# Patient Record
Sex: Male | Born: 1960
Health system: Southern US, Community
[De-identification: ages and names within clinical notes are randomized; demographics above are authoritative.]

## PROBLEM LIST (undated history)

## (undated) DIAGNOSIS — Z8739 Personal history of other diseases of the musculoskeletal system and connective tissue: Secondary | ICD-10-CM

## (undated) DIAGNOSIS — I10 Essential (primary) hypertension: Secondary | ICD-10-CM

## (undated) DIAGNOSIS — Z9289 Personal history of other medical treatment: Secondary | ICD-10-CM

## (undated) DIAGNOSIS — I2510 Atherosclerotic heart disease of native coronary artery without angina pectoris: Secondary | ICD-10-CM

## (undated) DIAGNOSIS — I739 Peripheral vascular disease, unspecified: Secondary | ICD-10-CM

## (undated) DIAGNOSIS — K219 Gastro-esophageal reflux disease without esophagitis: Secondary | ICD-10-CM

## (undated) DIAGNOSIS — C189 Malignant neoplasm of colon, unspecified: Secondary | ICD-10-CM

## (undated) DIAGNOSIS — C20 Malignant neoplasm of rectum: Secondary | ICD-10-CM

## (undated) DIAGNOSIS — M545 Low back pain, unspecified: Secondary | ICD-10-CM

## (undated) DIAGNOSIS — I82409 Acute embolism and thrombosis of unspecified deep veins of unspecified lower extremity: Secondary | ICD-10-CM

## (undated) DIAGNOSIS — Z720 Tobacco use: Secondary | ICD-10-CM

## (undated) DIAGNOSIS — I519 Heart disease, unspecified: Secondary | ICD-10-CM

## (undated) DIAGNOSIS — G8929 Other chronic pain: Secondary | ICD-10-CM

## (undated) DIAGNOSIS — R918 Other nonspecific abnormal finding of lung field: Secondary | ICD-10-CM

## (undated) DIAGNOSIS — I499 Cardiac arrhythmia, unspecified: Secondary | ICD-10-CM

## (undated) HISTORY — PX: ABDOMINAL SURGERY: SHX537

## (undated) HISTORY — PX: INGUINAL HERNIA REPAIR: SUR1180

## (undated) HISTORY — DX: Other nonspecific abnormal finding of lung field: R91.8

## (undated) HISTORY — DX: Malignant neoplasm of colon, unspecified: C18.9

---

## 2002-03-31 ENCOUNTER — Emergency Department (HOSPITAL_COMMUNITY): Admission: EM | Admit: 2002-03-31 | Discharge: 2002-03-31 | Payer: Self-pay | Admitting: Internal Medicine

## 2010-09-28 HISTORY — PX: COLECTOMY: SHX59

## 2010-12-02 DIAGNOSIS — C7800 Secondary malignant neoplasm of unspecified lung: Secondary | ICD-10-CM | POA: Insufficient documentation

## 2010-12-02 DIAGNOSIS — C20 Malignant neoplasm of rectum: Secondary | ICD-10-CM | POA: Insufficient documentation

## 2011-06-05 DIAGNOSIS — K5900 Constipation, unspecified: Secondary | ICD-10-CM | POA: Insufficient documentation

## 2011-06-05 DIAGNOSIS — I10 Essential (primary) hypertension: Secondary | ICD-10-CM | POA: Insufficient documentation

## 2011-06-05 DIAGNOSIS — K6289 Other specified diseases of anus and rectum: Secondary | ICD-10-CM | POA: Insufficient documentation

## 2011-06-25 DIAGNOSIS — IMO0002 Reserved for concepts with insufficient information to code with codable children: Secondary | ICD-10-CM | POA: Insufficient documentation

## 2011-08-31 DIAGNOSIS — R300 Dysuria: Secondary | ICD-10-CM | POA: Insufficient documentation

## 2011-08-31 DIAGNOSIS — N529 Male erectile dysfunction, unspecified: Secondary | ICD-10-CM | POA: Insufficient documentation

## 2011-08-31 DIAGNOSIS — R350 Frequency of micturition: Secondary | ICD-10-CM | POA: Insufficient documentation

## 2011-09-24 DIAGNOSIS — F329 Major depressive disorder, single episode, unspecified: Secondary | ICD-10-CM | POA: Insufficient documentation

## 2011-09-29 HISTORY — PX: COLONOSCOPY: SHX174

## 2011-09-29 HISTORY — PX: COLOSTOMY TAKEDOWN: SHX5783

## 2011-11-19 DIAGNOSIS — Z8719 Personal history of other diseases of the digestive system: Secondary | ICD-10-CM | POA: Insufficient documentation

## 2011-12-03 DIAGNOSIS — R12 Heartburn: Secondary | ICD-10-CM | POA: Insufficient documentation

## 2011-12-03 DIAGNOSIS — R112 Nausea with vomiting, unspecified: Secondary | ICD-10-CM | POA: Insufficient documentation

## 2011-12-04 DIAGNOSIS — E871 Hypo-osmolality and hyponatremia: Secondary | ICD-10-CM | POA: Insufficient documentation

## 2011-12-04 DIAGNOSIS — I871 Compression of vein: Secondary | ICD-10-CM | POA: Insufficient documentation

## 2011-12-04 DIAGNOSIS — I739 Peripheral vascular disease, unspecified: Secondary | ICD-10-CM | POA: Insufficient documentation

## 2011-12-04 DIAGNOSIS — D62 Acute posthemorrhagic anemia: Secondary | ICD-10-CM | POA: Insufficient documentation

## 2012-02-26 ENCOUNTER — Emergency Department (HOSPITAL_COMMUNITY)
Admission: EM | Admit: 2012-02-26 | Discharge: 2012-02-26 | Disposition: A | Discharge status: Home / Self Care | Payer: Medicaid Other | Attending: Emergency Medicine | Admitting: Emergency Medicine

## 2012-02-26 ENCOUNTER — Encounter (HOSPITAL_COMMUNITY): Payer: Self-pay

## 2012-02-26 DIAGNOSIS — K644 Residual hemorrhoidal skin tags: Secondary | ICD-10-CM | POA: Insufficient documentation

## 2012-02-26 DIAGNOSIS — K6289 Other specified diseases of anus and rectum: Secondary | ICD-10-CM | POA: Insufficient documentation

## 2012-02-26 DIAGNOSIS — F17200 Nicotine dependence, unspecified, uncomplicated: Secondary | ICD-10-CM | POA: Insufficient documentation

## 2012-02-26 DIAGNOSIS — I10 Essential (primary) hypertension: Secondary | ICD-10-CM | POA: Insufficient documentation

## 2012-02-26 HISTORY — DX: Essential (primary) hypertension: I10

## 2012-02-26 MED ORDER — OXYCODONE-ACETAMINOPHEN 5-325 MG PO TABS: 1.0000 | ORAL_TABLET | Freq: Four times a day (QID) | ORAL | Status: AC | PRN

## 2012-02-26 MED ORDER — HYDROCORTISONE 2.5 % RE CREA: TOPICAL_CREAM | RECTAL | Status: AC

## 2012-02-26 MED ORDER — OXYCODONE-ACETAMINOPHEN 5-325 MG PO TABS
2.0000 | ORAL_TABLET | Freq: Once | ORAL | Status: AC
  Administered 2012-02-26: 2 via ORAL
  Filled 2012-02-26: qty 2

## 2012-02-26 NOTE — Discharge Instructions (Signed)
Proctalgia Fugax Proctalgia fugax is a very short episode of intense rectal pain. It can last from seconds to minutes. It often occurs in the night, and awakens the person from sleep. It is not a sign of cancer.  CAUSES  The cause of this often intense rectal pain is not known. One possible cause may be spasm of the pelvic muscles or of the lowest part of the large intestine.  SYMPTOMS  The pain of proctalgia fugax:  Is intensely severe.   Lasts from only a few seconds to thirty minutes.   Usually awakens the person from sleep.  DIAGNOSIS  In order to make sure that there are no other problems, diagnostic tests may be done such as:   Anoscopy. This is a lighted scope that is put into the rectum to look for abnormalities.   Barium enema. X-rays are taken after administering a radio-sensitive material.  TREATMENT  A number of things have been used to try to treat this condition, including:  Medications.   Warm baths.   Relaxation techniques.   Gentle massage of the painful area.  HOME CARE INSTRUCTIONS   Take all medications exactly as directed.   Follow any prescribed diet.   Follow instructions regarding both rest and physical activity.   Learn progressive relaxation techniques.  SEEK IMMEDIATE MEDICAL CARE IF:   Your pain does not get better in the usual amount of time.   You develop any new symptoms.  Document Released: 06/09/2001 Document Revised: 09/03/2011 Document Reviewed: 11/15/2008 [facility] Patient Information 2012 [place].

## 2012-02-26 NOTE — ED Notes (Signed)
Pt c/o rectal pain for 2 months after colostomy reversal. Some bleeding noted when wiping

## 2012-02-26 NOTE — ED Provider Notes (Signed)
History   This chart was scribed for Juan Mangle, MD by Juan Wheeler. The patient was seen in room APA05/APA05. Patient's care was started at 0612.    CSN: [id number]  Arrival date & time 02/26/12  0612   First MD Initiated Contact with Patient 02/26/12 0701      Chief Complaint  Patient presents with  . Rectal Pain     (Consider location/radiation/quality/duration/timing/severity/associated sxs/prior treatment) HPI Juan Wheeler is a 51 y.o. male who presents to the Emergency Department complaining of waxing and waning moderate rectal pain onset several weeks ago and persistent since with associated intermittent episodes of red blood with wiping after BM. Patient reports he had ileostomy removed several months ago and has experienced a burning pain which is aggravated with flatulence since removal. Patient states he was prescribed pain medications by his surgeon following removal of ileostomy but recently ran out and reports he has been unable to follow up with surgeon since running out. Denies hematochezia, dizziness, weakness, syncope, chest pain, SOB, constipation.  Patient with h/o HTN and is a current smoker.  Past Medical History  Diagnosis Date  . Hypertension     Past Surgical History  Procedure Date  . Colostomy     History reviewed. No pertinent family history.  History  Substance Use Topics  . Smoking status: Current Everyday Smoker -- 1.0 packs/day  . Smokeless tobacco: Not on file  . Alcohol Use: Yes      Review of Systems  Constitutional: Negative for fever, activity change, appetite change and fatigue.  HENT: Negative for congestion, sore throat, rhinorrhea, neck pain and neck stiffness.   Respiratory: Negative for cough and shortness of breath.   Cardiovascular: Negative for chest pain and palpitations.  Gastrointestinal: Positive for blood in stool. Negative for nausea, vomiting and abdominal pain.  Genitourinary: Negative for dysuria, urgency,  frequency and flank pain.  Musculoskeletal: Negative for myalgias, back pain and arthralgias.  All other systems reviewed and are negative.   A complete 10 system review of systems was obtained and all systems are negative except as noted in the HPI and PMH.   Allergies  Darvocet  Home Medications   Current Outpatient Rx  Name Route Sig Dispense Refill  . HYDROCORTISONE 2.5 % RE CREA  Apply rectally 2 times daily 30 g 0  . OXYCODONE-ACETAMINOPHEN 5-325 MG PO TABS Oral Take 1-2 tablets by mouth every 6 (six) hours as needed for pain. 20 tablet 0    BP 160/104  Pulse 102  Temp(Src) 97.6 F (36.4 C) (Oral)  Resp 16  Ht 5\' 9"  (1.753 m)  Wt 174 lb (78.926 kg)  BMI 25.70 kg/m2  SpO2 96%  Physical Exam  Nursing note and vitals reviewed. Constitutional: He is oriented to person, place, and time. He appears well-developed and well-nourished. No distress.  HENT:  Head: Normocephalic and atraumatic.  Eyes: EOM are normal. Pupils are equal, round, and reactive to light.  Neck: Normal range of motion. Neck supple. No tracheal deviation present.  Cardiovascular: Normal rate, regular rhythm, normal heart sounds and intact distal pulses.   No murmur heard. Pulmonary/Chest: Effort normal and breath sounds normal. No respiratory distress. He exhibits no tenderness.  Abdominal: Soft. He exhibits no distension. There is no tenderness. There is no rebound and no guarding.  Genitourinary: Guaiac negative stool.       Chaperone present for rectal exam. Pain with rectal examination. External irritation noted. External hemorrhoids noted.  No mass or thrombosis noted. No  abscess or fissure.  Musculoskeletal: Normal range of motion. He exhibits no edema.  Neurological: He is alert and oriented to person, place, and time. No sensory deficit.  Skin: Skin is warm and dry.  Psychiatric: He has a normal mood and affect. His behavior is normal.    ED Course  Procedures (including critical care  time)  DIAGNOSTIC STUDIES: Oxygen Saturation is 96% on room air, adequate by my interpretation.    COORDINATION OF CARE: 7:07AM- Rectal exam performed by Juan Mangle, MD. Patient advised of need to follow up with surgeon.    Labs Reviewed - No data to display No results found.   1. Rectal pain       MDM  Proctalgia. There is no evidence of abscess or fissure. There is no fistula. Rectal exam is normal and without blood. He'll be discharged home with pain medication Anusol. Instructed to followup with his primary care physician and general surgeon      I personally performed the services described in this documentation, which was scribed in my presence. The recorded information has been reviewed and considered.    Juan Mangle, MD 02/26/12 [phone]

## 2012-03-24 DIAGNOSIS — M549 Dorsalgia, unspecified: Secondary | ICD-10-CM | POA: Insufficient documentation

## 2012-04-08 ENCOUNTER — Emergency Department (HOSPITAL_COMMUNITY)
Admission: EM | Admit: 2012-04-08 | Discharge: 2012-04-08 | Disposition: A | Discharge status: Home / Self Care | Payer: Medicaid Other | Attending: Emergency Medicine | Admitting: Emergency Medicine

## 2012-04-08 ENCOUNTER — Encounter (HOSPITAL_COMMUNITY): Payer: Self-pay

## 2012-04-08 DIAGNOSIS — H6120 Impacted cerumen, unspecified ear: Secondary | ICD-10-CM | POA: Insufficient documentation

## 2012-04-08 DIAGNOSIS — I10 Essential (primary) hypertension: Secondary | ICD-10-CM | POA: Insufficient documentation

## 2012-04-08 DIAGNOSIS — Z85048 Personal history of other malignant neoplasm of rectum, rectosigmoid junction, and anus: Secondary | ICD-10-CM | POA: Insufficient documentation

## 2012-04-08 DIAGNOSIS — F17200 Nicotine dependence, unspecified, uncomplicated: Secondary | ICD-10-CM | POA: Insufficient documentation

## 2012-04-08 MED ORDER — DOCUSATE SODIUM 50 MG/5ML PO LIQD: 50.0000 mg | Freq: Once | ORAL | Status: DC

## 2012-04-08 MED ORDER — DOCUSATE SODIUM 50 MG/5ML PO LIQD
50.0000 mg | Freq: Once | ORAL | Status: AC
  Administered 2012-04-08: 50 mg via ORAL
  Filled 2012-04-08: qty 10

## 2012-04-08 MED ORDER — CARBAMIDE PEROXIDE 6.5 % OT SOLN
10.0000 [drp] | Freq: Once | OTIC | Status: AC
  Administered 2012-04-08: 10 [drp] via OTIC
  Filled 2012-04-08: qty 15

## 2012-04-08 NOTE — ED Notes (Signed)
Flushed rt ear with peroxide and water. Not able to get anything broken up from within ear.

## 2012-04-08 NOTE — ED Notes (Signed)
Pt reports ears have been stopped up for the past month.  C/O r ear pain.

## 2012-04-08 NOTE — ED Notes (Signed)
Flushed bilateral ears with no success.

## 2012-04-11 NOTE — ED Provider Notes (Signed)
History     CSN: [id number]  Arrival date & time 04/08/12  0932   First MD Initiated Contact with Patient 04/08/12 [phone]      Chief Complaint  Patient presents with  . Otalgia    (Consider location/radiation/quality/duration/timing/severity/associated sxs/prior treatment) Patient is a 51 y.o. male presenting with ear pain. The history is provided by the patient.  Otalgia This is a chronic problem. The current episode started more than 1 week ago. There is pain in both ears. The problem occurs constantly. The problem has not changed since onset.There has been no fever. The pain is mild. Associated symptoms include hearing loss. Pertinent negatives include no ear discharge, no headaches, no rhinorrhea, no sore throat, no vomiting, no neck pain, no cough and no rash.    Past Medical History  Diagnosis Date  . Hypertension   . Cancer     rectal    Past Surgical History  Procedure Date  . Colostomy   . Abdominal surgery   . Hernia repair     No family history on file.  History  Substance Use Topics  . Smoking status: Current Everyday Smoker -- 1.0 packs/day  . Smokeless tobacco: Not on file  . Alcohol Use: Yes     occ      Review of Systems  Constitutional: Negative for fever, chills, activity change and appetite change.  HENT: Positive for hearing loss and ear pain. Negative for congestion, sore throat, facial swelling, rhinorrhea, trouble swallowing, neck pain, neck stiffness and ear discharge.   Eyes: Negative for visual disturbance.  Respiratory: Negative for cough and shortness of breath.   Gastrointestinal: Negative for nausea and vomiting.  Skin: Negative.  Negative for rash.  Neurological: Negative for dizziness, facial asymmetry, weakness, numbness and headaches.  Hematological: Negative for adenopathy.  Psychiatric/Behavioral: Negative for confusion.  All other systems reviewed and are negative.    Allergies  Darvocet  Home Medications   Current  Outpatient Rx  Name Route Sig Dispense Refill  . AMLODIPINE BESYLATE 5 MG PO TABS Oral Take 5 mg by mouth daily.    . ASPIRIN EC 81 MG PO TBEC Oral Take 81 mg by mouth daily.    Marland Kitchen GABAPENTIN 300 MG PO CAPS Oral Take 300 mg by mouth 3 (three) times daily.    Marland Kitchen LISINOPRIL 40 MG PO TABS Oral Take 40 mg by mouth daily.    Marland Kitchen NAPROXEN SODIUM 220 MG PO TABS Oral Take 220 mg by mouth 2 (two) times daily with a meal.      BP 149/76  Pulse 60  Temp 97.6 F (36.4 C) (Oral)  Resp 18  Ht 5\' 8"  (1.727 m)  Wt 175 lb (79.379 kg)  BMI 26.61 kg/m2  SpO2 97%  Physical Exam  Nursing note and vitals reviewed. Constitutional: He is oriented to person, place, and time. He appears well-developed and well-nourished. No distress.  HENT:  Head: Normocephalic and atraumatic. No trismus in the jaw.  Right Ear: No drainage or swelling. No mastoid tenderness. No hemotympanum. Decreased hearing is noted.  Left Ear: No drainage or swelling. No mastoid tenderness. No hemotympanum. Decreased hearing is noted.  Nose: Mucosal edema and rhinorrhea present.  Mouth/Throat: Uvula is midline and mucous membranes are normal. No uvula swelling. Posterior oropharyngeal erythema present. No oropharyngeal exudate, posterior oropharyngeal edema or tonsillar abscesses.       Large amt of dry, packed cerumen present to the bilateral ear canals.  TM's not visualized due to the cerumen.  No edema or drainage.  Neck: Normal range of motion and phonation normal. Neck supple. No Brudzinski's sign and no Kernig's sign noted.  Cardiovascular: Normal rate, regular rhythm, normal heart sounds and intact distal pulses.   No murmur heard. Pulmonary/Chest: Effort normal and breath sounds normal. He has no wheezes. He has no rales.  Abdominal: Soft. Bowel sounds are normal.  Musculoskeletal: He exhibits no edema.  Lymphadenopathy:    He has no cervical adenopathy.  Neurological: He is alert and oriented to person, place, and time. He  exhibits normal muscle tone. Coordination normal.  Skin: Skin is warm and dry.    ED Course  Procedures (including critical care time)  Labs Reviewed - No data to display   1. Cerumen impaction       MDM    Several attempts  By the nursing staff to remove the cerumen from the bilateral ear canals, using H2O2, debrox, and liquid colace..  Only  Small amt was removed.  Patient reports feeling better.  Denies pain, ataxia,  or dizziness at present.  I have advised him that he will need to follow-up with ENT for further management.  Pt appears stable for discharge.  He verbalized understanding and agreed to care plan.  Remaining bottle of debrox was dispensed to pt for home use with instructions for usage.     The patient appears reasonably screened and/or stabilized for discharge and I doubt any other medical condition or other [facility] requiring further screening, evaluation, or treatment in the ED at this time prior to discharge.      Viren Lebeau L. [place], [place] 04/11/12 1524

## 2012-04-12 NOTE — ED Provider Notes (Signed)
Medical screening examination/treatment/procedure(s) were performed by non-physician practitioner and as supervising physician I was immediately available for consultation/collaboration.   Mylinda Latina III, MD 04/12/12 1321

## 2012-08-19 DIAGNOSIS — I70219 Atherosclerosis of native arteries of extremities with intermittent claudication, unspecified extremity: Secondary | ICD-10-CM | POA: Insufficient documentation

## 2012-09-26 ENCOUNTER — Emergency Department (HOSPITAL_COMMUNITY)
Admission: EM | Admit: 2012-09-26 | Discharge: 2012-09-26 | Disposition: A | Discharge status: Home / Self Care | Payer: Medicaid Other | Attending: Emergency Medicine | Admitting: Emergency Medicine

## 2012-09-26 ENCOUNTER — Encounter (HOSPITAL_COMMUNITY): Payer: Self-pay | Admitting: Emergency Medicine

## 2012-09-26 DIAGNOSIS — Z7982 Long term (current) use of aspirin: Secondary | ICD-10-CM | POA: Insufficient documentation

## 2012-09-26 DIAGNOSIS — F1210 Cannabis abuse, uncomplicated: Secondary | ICD-10-CM | POA: Insufficient documentation

## 2012-09-26 DIAGNOSIS — Z85048 Personal history of other malignant neoplasm of rectum, rectosigmoid junction, and anus: Secondary | ICD-10-CM | POA: Insufficient documentation

## 2012-09-26 DIAGNOSIS — F17200 Nicotine dependence, unspecified, uncomplicated: Secondary | ICD-10-CM | POA: Insufficient documentation

## 2012-09-26 DIAGNOSIS — Z79899 Other long term (current) drug therapy: Secondary | ICD-10-CM | POA: Insufficient documentation

## 2012-09-26 DIAGNOSIS — Y929 Unspecified place or not applicable: Secondary | ICD-10-CM | POA: Insufficient documentation

## 2012-09-26 DIAGNOSIS — Z86718 Personal history of other venous thrombosis and embolism: Secondary | ICD-10-CM | POA: Insufficient documentation

## 2012-09-26 DIAGNOSIS — I10 Essential (primary) hypertension: Secondary | ICD-10-CM | POA: Insufficient documentation

## 2012-09-26 DIAGNOSIS — M549 Dorsalgia, unspecified: Secondary | ICD-10-CM

## 2012-09-26 DIAGNOSIS — IMO0002 Reserved for concepts with insufficient information to code with codable children: Secondary | ICD-10-CM | POA: Insufficient documentation

## 2012-09-26 DIAGNOSIS — Y939 Activity, unspecified: Secondary | ICD-10-CM | POA: Insufficient documentation

## 2012-09-26 DIAGNOSIS — W19XXXA Unspecified fall, initial encounter: Secondary | ICD-10-CM | POA: Insufficient documentation

## 2012-09-26 HISTORY — DX: Acute embolism and thrombosis of unspecified deep veins of unspecified lower extremity: I82.409

## 2012-09-26 MED ORDER — PREDNISONE 10 MG PO TABS: ORAL_TABLET | ORAL | Status: DC

## 2012-09-26 MED ORDER — OXYCODONE-ACETAMINOPHEN 5-325 MG PO TABS
2.0000 | ORAL_TABLET | Freq: Once | ORAL | Status: AC
  Administered 2012-09-26: 2 via ORAL
  Filled 2012-09-26: qty 2

## 2012-09-26 MED ORDER — PREDNISONE 50 MG PO TABS
60.0000 mg | ORAL_TABLET | Freq: Once | ORAL | Status: AC
  Administered 2012-09-26: 60 mg via ORAL
  Filled 2012-09-26: qty 1

## 2012-09-26 MED ORDER — OXYCODONE-ACETAMINOPHEN 5-325 MG PO TABS: 2.0000 | ORAL_TABLET | Freq: Once | ORAL | Status: DC

## 2012-09-26 NOTE — ED Provider Notes (Signed)
History   This chart was scribed for Richarda Blade, MD by Marin Comment, ED Scribe. The patient was seen in room APA15/APA15. Patient's care was started at 0949.   CSN: [id number]  Arrival date & time 09/26/12  0909   First MD Initiated Contact with Patient 09/26/12 [phone]      Chief Complaint  Patient presents with  . Back Pain   The history is provided by the patient. No language interpreter was used.  Juan Wheeler is a 51 y.o. male who presents to the Emergency Department complaining of constant, severe lower back pain for the past 3 days. He states that he fell 3 days ago and has had pain since. He states the shooting pain goes into his hips and legs but this has been present for the past few months. He states that he hasn't tried anything for his pain at home. He states that he is to have a procedure for blood clots in his legs in January. He states his back pain is worse with movements. He states that he had an x-ray at [facility] pain clinic. He takes Gabapentin regularly, but reports no relief with his back pain.   Past Medical History  Diagnosis Date  . Hypertension   . Cancer     rectal  . DVT (deep venous thrombosis)     Past Surgical History  Procedure Date  . Colostomy   . Abdominal surgery   . Hernia repair     History reviewed. No pertinent family history.  History  Substance Use Topics  . Smoking status: Current Every Day Smoker -- 1.0 packs/day    Types: Cigarettes  . Smokeless tobacco: Not on file  . Alcohol Use: Yes     Comment: occ      Review of Systems  Musculoskeletal: Positive for back pain and arthralgias.  All other systems reviewed and are negative.    Allergies  Darvocet  Home Medications   Current Outpatient Rx  Name  Route  Sig  Dispense  Refill  . ASPIRIN EC 81 MG PO TBEC   Oral   Take 81 mg by mouth daily.         Marland Kitchen GABAPENTIN 300 MG PO CAPS   Oral   Take 300 mg by mouth 3 (three) times daily. Takes 600 mg in  the morning, 300 in the evening, and 600 in the afternoon.         Marland Kitchen LISINOPRIL 40 MG PO TABS   Oral   Take 40 mg by mouth daily.         . OXYCODONE-ACETAMINOPHEN 5-325 MG PO TABS   Oral   Take 2 tablets by mouth once.   30 tablet   0   . PREDNISONE 10 MG PO TABS      Take q day 6,5,4,3,2,1   21 tablet   0     BP 160/104  Pulse 68  Temp 97.4 F (36.3 C) (Oral)  Resp 18  Ht 5\' 9"  (1.753 m)  Wt 193 lb (87.544 kg)  BMI 28.50 kg/m2  SpO2 94%  Physical Exam  Nursing note and vitals reviewed. Constitutional: He is oriented to person, place, and time. He appears well-developed and well-nourished. No distress.  HENT:  Head: Normocephalic and atraumatic.  Eyes: Conjunctivae normal and EOM are normal.  Neck: Neck supple. No tracheal deviation present.  Cardiovascular: Normal rate, regular rhythm and normal heart sounds.   No murmur heard. Pulmonary/Chest: Effort normal. No respiratory  distress. He has no wheezes. He has no rhonchi. He has no rales.  Abdominal: He exhibits no distension.  Musculoskeletal: Normal range of motion. He exhibits tenderness.       Left lumbar tenderness, lumbar spinal tenderness. Left and right buttocks tenderness.   Neurological: He is alert and oriented to person, place, and time. No sensory deficit.  Skin: Skin is warm and dry.  Psychiatric: He has a normal mood and affect. His behavior is normal.    ED Course  Procedures (including critical care time)  DIAGNOSTIC STUDIES: Oxygen Saturation is 99% on room air, normal by my interpretation.    COORDINATION OF CARE:  10:03-Discussed planned course of treatment with the patient including Prednisone and Percocet, who is agreeable at this time.   10:15-Medication Orders: Prednisone (Deltasone) tablet 60-mg-once; Oxycodone-acetaminophen (Percocet/Roxicet) 5-325 mg per tablet 2 tablet-once.   11:37-Recheck: X-rays were unable to be obtained from pain clinic. Pt reports some relief with ED  medications. Pt appears more comfortable and better able to move his legs. Will refer back to pain clinic and d/c home with pain medication.   1. Back pain       MDM  Nonspecific back and leg pain, most consistent with lumbar radiculopathy, likely from degenerative joint disease. Doubt cauda equina, discitis, sciatica, or fracture. Doubt metabolic instability, serious bacterial infection or impending vascular collapse; the patient is stable for discharge.    I personally performed the services described in this documentation, which was scForribed in my presence. The recorded information has been reviewed and is accurate.     Plan: Home Medications- Percocet, Prednisoneest, heat;Recommended follow up-  PCP, prn      Richarda Blade, MD 09/27/12 [phone]

## 2012-09-26 NOTE — ED Notes (Signed)
Lifting heavy loads of wood Friday and now complaining of severe back pain with shooting pain in hips and legs

## 2012-10-12 DIAGNOSIS — M792 Neuralgia and neuritis, unspecified: Secondary | ICD-10-CM | POA: Insufficient documentation

## 2012-11-09 DIAGNOSIS — G894 Chronic pain syndrome: Secondary | ICD-10-CM | POA: Insufficient documentation

## 2012-11-09 DIAGNOSIS — M25559 Pain in unspecified hip: Secondary | ICD-10-CM | POA: Insufficient documentation

## 2013-03-28 ENCOUNTER — Emergency Department (HOSPITAL_COMMUNITY): Payer: Medicaid Other

## 2013-03-28 ENCOUNTER — Encounter (HOSPITAL_COMMUNITY): Payer: Self-pay | Admitting: *Deleted

## 2013-03-28 ENCOUNTER — Observation Stay (HOSPITAL_COMMUNITY)
Admission: EM | Admit: 2013-03-28 | Discharge: 2013-03-30 | Disposition: A | Discharge status: Home / Self Care | Payer: Medicaid Other | Attending: Internal Medicine | Admitting: Internal Medicine

## 2013-03-28 DIAGNOSIS — R079 Chest pain, unspecified: Secondary | ICD-10-CM | POA: Diagnosis not present

## 2013-03-28 DIAGNOSIS — F17200 Nicotine dependence, unspecified, uncomplicated: Secondary | ICD-10-CM | POA: Insufficient documentation

## 2013-03-28 DIAGNOSIS — E663 Overweight: Secondary | ICD-10-CM | POA: Insufficient documentation

## 2013-03-28 DIAGNOSIS — R0602 Shortness of breath: Secondary | ICD-10-CM | POA: Insufficient documentation

## 2013-03-28 DIAGNOSIS — R42 Dizziness and giddiness: Secondary | ICD-10-CM | POA: Diagnosis not present

## 2013-03-28 DIAGNOSIS — Z85048 Personal history of other malignant neoplasm of rectum, rectosigmoid junction, and anus: Secondary | ICD-10-CM

## 2013-03-28 DIAGNOSIS — Z720 Tobacco use: Secondary | ICD-10-CM | POA: Diagnosis present

## 2013-03-28 DIAGNOSIS — I739 Peripheral vascular disease, unspecified: Secondary | ICD-10-CM | POA: Insufficient documentation

## 2013-03-28 DIAGNOSIS — I10 Essential (primary) hypertension: Secondary | ICD-10-CM

## 2013-03-28 DIAGNOSIS — R61 Generalized hyperhidrosis: Secondary | ICD-10-CM | POA: Diagnosis not present

## 2013-03-28 LAB — CBC WITH DIFFERENTIAL/PLATELET
Basophils Absolute: 0 10*3/uL (ref 0.0–0.1)
Basophils Relative: 0 % (ref 0–1)
Eosinophils Absolute: 0.2 10*3/uL (ref 0.0–0.7)
Eosinophils Relative: 2 % (ref 0–5)
HCT: 47.5 % (ref 39.0–52.0)
Hemoglobin: 16.5 g/dL (ref 13.0–17.0)
Lymphocytes Relative: 21 % (ref 12–46)
Lymphs Abs: 1.8 10*3/uL (ref 0.7–4.0)
MCH: 33.3 pg (ref 26.0–34.0)
MCHC: 34.7 g/dL (ref 30.0–36.0)
MCV: 95.8 fL (ref 78.0–100.0)
Monocytes Absolute: 0.7 10*3/uL (ref 0.1–1.0)
Monocytes Relative: 8 % (ref 3–12)
Neutro Abs: 5.9 10*3/uL (ref 1.7–7.7)
Neutrophils Relative %: 68 % (ref 43–77)
Platelets: 172 10*3/uL (ref 150–400)
RBC: 4.96 MIL/uL (ref 4.22–5.81)
RDW: 15.2 % (ref 11.5–15.5)
WBC: 8.6 10*3/uL (ref 4.0–10.5)

## 2013-03-28 LAB — BASIC METABOLIC PANEL
BUN: 12 mg/dL (ref 6–23)
CO2: 26 mEq/L (ref 19–32)
Calcium: 9.2 mg/dL (ref 8.4–10.5)
Chloride: 102 mEq/L (ref 96–112)
Creatinine, Ser: 1.2 mg/dL (ref 0.50–1.35)
GFR calc Af Amer: 78 mL/min — ABNORMAL LOW (ref >90)
GFR calc non Af Amer: 67 mL/min — ABNORMAL LOW (ref >90)
Glucose, Bld: 108 mg/dL — ABNORMAL HIGH (ref 70–99)
Potassium: 4.1 mEq/L (ref 3.5–5.1)
Sodium: 138 mEq/L (ref 135–145)

## 2013-03-28 LAB — TROPONIN I: Troponin I: <0.3 ng/mL (ref <0.30)

## 2013-03-28 MED ORDER — ASPIRIN 325 MG PO TABS
325.0000 mg | ORAL_TABLET | Freq: Once | ORAL | Status: AC
  Administered 2013-03-28: 325 mg via ORAL
  Filled 2013-03-28: qty 1

## 2013-03-28 MED ORDER — NITROGLYCERIN 0.4 MG SL SUBL: 0.4000 mg | SUBLINGUAL_TABLET | SUBLINGUAL | Status: DC | PRN

## 2013-03-28 NOTE — ED Notes (Addendum)
Pt reporting intermittent chest pain for about 5 days.  Repots pain worse when laying down.  Denies nausea or vomiting.  Occasional SOB with pain. Reports pain has subsided at present. Reports he has not been taking his medications for several days, because he thought that was causing pain.

## 2013-03-28 NOTE — ED Provider Notes (Signed)
History     This chart was scribed for Juan Cable, MD, MD by Rhae Lerner, ED Scribe. The patient was seen in room APA08/APA08 and the patient's care was started at 11:10PM.  CSN: [id number] Arrival date & time 03/28/13  2034    Chief Complaint  Patient presents with  . Chest Pain    Patient is a 52 y.o. male presenting with chest pain. The history is provided by the patient and medical records. No language interpreter was used.  Chest Pain Pain location:  L chest Pain quality: sharp   Pain radiates to:  Does not radiate Pain radiates to the back: no   Pain severity:  Moderate Onset quality:  Sudden Duration:  5 days Timing:  Intermittent Progression:  Unchanged Chronicity:  New Relieved by:  Nothing Worsened by:  Nothing tried Associated symptoms: diaphoresis   Associated symptoms: no fever, no nausea, no shortness of breath, not vomiting and no weakness    HPI Comments: Giovonni Zaugg is a 52 y.o. male with hx of rectal CA, DVT (discovered by [place] 5 months ago) and HTN who presents to the Emergency Department complaining of intermittent, sharp, left chest pain onset 5 days ago. Pt reports that the episodes of chest pain last for 10 minutes or more. He mentions having intermittent numbness in left arm. He states that during the episodes of pain he has SOB and dizziness. He denies hx of similar symptoms. He denies hx of MI or cardiac problems. He denies any aggravating factors. Pt denies radiation of pain, fever, chills, nausea, vomiting, diarrhea, new weakness, cough, diaphoresis and any other pain.  Pt denies taking medication for pain PTA.   Past Medical History  Diagnosis Date  . Hypertension   . Cancer     rectal  . DVT (deep venous thrombosis)    Past Surgical History  Procedure Laterality Date  . Colostomy    . Abdominal surgery    . Hernia repair     History reviewed. No pertinent family history. History  Substance Use Topics  . Smoking status: Current Every  Day Smoker -- 1.00 packs/day    Types: Cigarettes  . Smokeless tobacco: Not on file  . Alcohol Use: Yes     Comment: occ    Review of Systems  Constitutional: Positive for diaphoresis. Negative for fever and chills.  Respiratory: Negative for shortness of breath.   Cardiovascular: Positive for chest pain.  Gastrointestinal: Negative for nausea and vomiting.  Neurological: Negative for weakness.  All other systems reviewed and are negative.    Allergies  Darvocet  Home Medications   Current Outpatient Rx  Name  Route  Sig  Dispense  Refill  . aspirin EC 81 MG tablet   Oral   Take 81 mg by mouth daily.         . cilostazol (PLETAL) 100 MG tablet   Oral   Take 50-100 mg by mouth 2 (two) times daily. Take 0.5-1 by mouth 2 times daily. Takes only one-half tablet per dose if headache occurs         . gabapentin (NEURONTIN) 300 MG capsule   Oral   Take 900-1,200 mg by mouth 3 (three) times daily. Takes 900 mg in the morning, 1200 in the afternoon, and 1200 in the evening         . lisinopril (PRINIVIL,ZESTRIL) 40 MG tablet   Oral   Take 40 mg by mouth daily.         Marland Kitchen  omeprazole (PRILOSEC) 40 MG capsule   Oral   Take 40 mg by mouth daily. TAKE 1 CAPSULE EVERY DAY          BP 185/107  Pulse 82  Temp(Src) 97.9 F (36.6 C) (Oral)  Resp 18  Ht 5\' 9"  (1.753 m)  Wt 190 lb (86.183 kg)  BMI 28.05 kg/m2  SpO2 98%  Physical Exam  Nursing note and vitals reviewed. CONSTITUTIONAL: Well developed/well nourished HEAD: Normocephalic/atraumatic EYES: EOMI/PERRL ENMT: Mucous membranes moist NECK: supple no meningeal signs SPINE:entire spine nontender CV: S1/S2 noted, no murmurs/rubs/gallops noted LUNGS: Lungs are clear to auscultation bilaterally, no apparent distress ABDOMEN: soft, nontender, no rebound or guarding.  Well healed scars noted.  No colostomy noted GU:no cva tenderness NEURO: Pt is awake/alert, moves all extremitiesx4, no focal motor  weakness EXTREMITIES: pulses normal, full ROM SKIN: warm, color normal PSYCH: no abnormalities of mood noted   ED Course  Procedures  DIAGNOSTIC STUDIES: Oxygen Saturation is 98% on room air, normal by my interpretation.    COORDINATION OF CARE: 11:15 PM Discussed ED treatment with pt and pt agrees.   1:42 AM Pt now CP free PE ruled out by d-dimer (low suspicion but h/o DVT per patient) I doubt aortic dissection given exam/history Will admit for possible ACS D/w dr Megan Salon, to admit  MDM  Nursing notes including past medical history and social history reviewed and considered in documentation xrays reviewed and considered Labs/vital reviewed and considered Previous records reviewed and considered - records at [facility] reviewed      Date: 03/29/2013  Rate: 68  Rhythm: normal sinus rhythm  QRS Axis: normal  Intervals: normal  ST/T Wave abnormalities: nonspecific ST changes  Conduction Disutrbances:none  Narrative Interpretation:   Old EKG Reviewed: no change from EKG from 2013 (found in [place] records)    I personally performed the services described in this documentation, which was scribed in my presence. The recorded information has been reviewed and is accurate.        Juan Cable, MD 03/29/13 [phone]

## 2013-03-28 NOTE — ED Notes (Signed)
Pt denies any chest pain at present time.  

## 2013-03-29 ENCOUNTER — Encounter (HOSPITAL_COMMUNITY): Payer: Self-pay | Admitting: *Deleted

## 2013-03-29 DIAGNOSIS — I10 Essential (primary) hypertension: Secondary | ICD-10-CM | POA: Diagnosis not present

## 2013-03-29 DIAGNOSIS — R079 Chest pain, unspecified: Secondary | ICD-10-CM

## 2013-03-29 DIAGNOSIS — F17200 Nicotine dependence, unspecified, uncomplicated: Secondary | ICD-10-CM | POA: Diagnosis not present

## 2013-03-29 DIAGNOSIS — Z720 Tobacco use: Secondary | ICD-10-CM | POA: Diagnosis present

## 2013-03-29 DIAGNOSIS — I739 Peripheral vascular disease, unspecified: Secondary | ICD-10-CM

## 2013-03-29 DIAGNOSIS — Z85048 Personal history of other malignant neoplasm of rectum, rectosigmoid junction, and anus: Secondary | ICD-10-CM

## 2013-03-29 DIAGNOSIS — E663 Overweight: Secondary | ICD-10-CM

## 2013-03-29 LAB — COMPREHENSIVE METABOLIC PANEL
ALT: 7 U/L (ref 0–53)
AST: 14 U/L (ref 0–37)
Albumin: 3.1 g/dL — ABNORMAL LOW (ref 3.5–5.2)
Alkaline Phosphatase: 72 U/L (ref 39–117)
BUN: 11 mg/dL (ref 6–23)
CO2: 25 mEq/L (ref 19–32)
Calcium: 8.8 mg/dL (ref 8.4–10.5)
Chloride: 105 mEq/L (ref 96–112)
Creatinine, Ser: 1.07 mg/dL (ref 0.50–1.35)
GFR calc Af Amer: 90 mL/min — ABNORMAL LOW (ref >90)
GFR calc non Af Amer: 77 mL/min — ABNORMAL LOW (ref >90)
Glucose, Bld: 108 mg/dL — ABNORMAL HIGH (ref 70–99)
Potassium: 4 mEq/L (ref 3.5–5.1)
Sodium: 139 mEq/L (ref 135–145)
Total Bilirubin: 0.5 mg/dL (ref 0.3–1.2)
Total Protein: 7 g/dL (ref 6.0–8.3)

## 2013-03-29 LAB — CBC
HCT: 46.7 % (ref 39.0–52.0)
Hemoglobin: 16.2 g/dL (ref 13.0–17.0)
MCH: 33.3 pg (ref 26.0–34.0)
MCHC: 34.7 g/dL (ref 30.0–36.0)
MCV: 95.9 fL (ref 78.0–100.0)
Platelets: 174 10*3/uL (ref 150–400)
RBC: 4.87 MIL/uL (ref 4.22–5.81)
RDW: 15.1 % (ref 11.5–15.5)
WBC: 6.6 10*3/uL (ref 4.0–10.5)

## 2013-03-29 LAB — URINALYSIS, ROUTINE W REFLEX MICROSCOPIC
Bilirubin Urine: NEGATIVE
Glucose, UA: NEGATIVE mg/dL
Hgb urine dipstick: NEGATIVE
Ketones, ur: NEGATIVE mg/dL
Leukocytes, UA: NEGATIVE
Nitrite: NEGATIVE
Protein, ur: NEGATIVE mg/dL
Specific Gravity, Urine: 1.015 (ref 1.005–1.030)
Urobilinogen, UA: 1 mg/dL (ref 0.0–1.0)
pH: 6 (ref 5.0–8.0)

## 2013-03-29 LAB — LIPID PANEL
Cholesterol: 161 mg/dL (ref 0–200)
HDL: 45 mg/dL (ref >39)
LDL Cholesterol: 96 mg/dL (ref 0–99)
Total CHOL/HDL Ratio: 3.6 RATIO
Triglycerides: 100 mg/dL (ref <150)
VLDL: 20 mg/dL (ref 0–40)

## 2013-03-29 LAB — D-DIMER, QUANTITATIVE: D-Dimer, Quant: <0.27 ug/mL-FEU (ref 0.00–0.48)

## 2013-03-29 LAB — TROPONIN I
Troponin I: <0.3 ng/mL (ref <0.30)
Troponin I: <0.3 ng/mL (ref <0.30)
Troponin I: <0.3 ng/mL (ref <0.30)

## 2013-03-29 LAB — MAGNESIUM: Magnesium: 2 mg/dL (ref 1.5–2.5)

## 2013-03-29 LAB — TSH: TSH: 1.083 u[IU]/mL (ref 0.350–4.500)

## 2013-03-29 LAB — HEMOGLOBIN A1C
Hgb A1c MFr Bld: 5.8 % — ABNORMAL HIGH (ref <5.7)
Mean Plasma Glucose: 120 mg/dL — ABNORMAL HIGH (ref <117)

## 2013-03-29 MED ORDER — ASPIRIN EC 81 MG PO TBEC
81.0000 mg | DELAYED_RELEASE_TABLET | Freq: Every day | ORAL | Status: DC
  Administered 2013-03-29 – 2013-03-30 (×2): 81 mg via ORAL
  Filled 2013-03-29 (×2): qty 1

## 2013-03-29 MED ORDER — ACETAMINOPHEN 325 MG PO TABS: 650.0000 mg | ORAL_TABLET | ORAL | Status: DC | PRN

## 2013-03-29 MED ORDER — ONDANSETRON HCL 4 MG/2ML IJ SOLN: 4.0000 mg | INTRAMUSCULAR | Status: DC | PRN

## 2013-03-29 MED ORDER — NICOTINE 21 MG/24HR TD PT24: 21.0000 mg | MEDICATED_PATCH | Freq: Every day | TRANSDERMAL | Status: DC | PRN

## 2013-03-29 MED ORDER — GABAPENTIN 300 MG PO CAPS
900.0000 mg | ORAL_CAPSULE | Freq: Every day | ORAL | Status: DC
  Administered 2013-03-29 – 2013-03-30 (×2): 900 mg via ORAL
  Filled 2013-03-29 (×2): qty 3

## 2013-03-29 MED ORDER — SODIUM CHLORIDE 0.9 % IJ SOLN
3.0000 mL | Freq: Two times a day (BID) | INTRAMUSCULAR | Status: DC
  Administered 2013-03-29 – 2013-03-30 (×3): 3 mL via INTRAVENOUS

## 2013-03-29 MED ORDER — GABAPENTIN 400 MG PO CAPS
1200.0000 mg | ORAL_CAPSULE | ORAL | Status: DC
  Administered 2013-03-29 (×2): 1200 mg via ORAL
  Filled 2013-03-29 (×2): qty 3

## 2013-03-29 MED ORDER — ENOXAPARIN SODIUM 40 MG/0.4ML ~~LOC~~ SOLN
40.0000 mg | SUBCUTANEOUS | Status: DC
  Administered 2013-03-29 – 2013-03-30 (×2): 40 mg via SUBCUTANEOUS
  Filled 2013-03-29 (×2): qty 0.4

## 2013-03-29 MED ORDER — FLEET ENEMA 7-19 GM/118ML RE ENEM: 1.0000 | ENEMA | Freq: Once | RECTAL | Status: AC | PRN

## 2013-03-29 MED ORDER — CILOSTAZOL 50 MG PO TABS
50.0000 mg | ORAL_TABLET | Freq: Two times a day (BID) | ORAL | Status: DC
  Filled 2013-03-29: qty 2

## 2013-03-29 MED ORDER — TRAZODONE HCL 50 MG PO TABS
50.0000 mg | ORAL_TABLET | Freq: Every evening | ORAL | Status: DC | PRN
  Administered 2013-03-29: 50 mg via ORAL
  Filled 2013-03-29: qty 1

## 2013-03-29 MED ORDER — CILOSTAZOL 100 MG PO TABS
100.0000 mg | ORAL_TABLET | Freq: Two times a day (BID) | ORAL | Status: DC
  Administered 2013-03-29 – 2013-03-30 (×3): 100 mg via ORAL
  Filled 2013-03-29 (×5): qty 1

## 2013-03-29 MED ORDER — PANTOPRAZOLE SODIUM 40 MG PO TBEC
40.0000 mg | DELAYED_RELEASE_TABLET | Freq: Two times a day (BID) | ORAL | Status: DC
  Administered 2013-03-29 – 2013-03-30 (×3): 40 mg via ORAL
  Filled 2013-03-29 (×3): qty 1

## 2013-03-29 MED ORDER — SORBITOL 70 % SOLN: 30.0000 mL | Freq: Every day | Status: DC | PRN

## 2013-03-29 MED ORDER — POLYETHYLENE GLYCOL 3350 17 G PO PACK: 17.0000 g | PACK | Freq: Every day | ORAL | Status: DC | PRN

## 2013-03-29 MED ORDER — LISINOPRIL 10 MG PO TABS
40.0000 mg | ORAL_TABLET | Freq: Every day | ORAL | Status: DC
  Administered 2013-03-29 – 2013-03-30 (×2): 40 mg via ORAL
  Filled 2013-03-29 (×2): qty 4

## 2013-03-29 NOTE — H&P (Signed)
[facility] History and Physical  Juan Wheeler  [id number]  DOB: [birth year]   DOA: 03/29/2013   PCP:   No primary provider on file.   Chief Complaint:  Episodic chest pain for 4 days  HPI: Juan Wheeler is a 52 y.o. male.   African American gentleman who denies prior cardiac, as when having episodic central chest pain radiating up into his neck and into his left arm for the past 4 days. Describes the pain as sharp and it is worse 10 out of 10 typically  last about 10-15 minutes, typically no relieving factors although he does say sometimes drinking water helps.  Pain is associated with dizziness, heat, shortness of breath, and nausea; no diaphoresis He has no family history of cardiac disease; he does smoke one pack of cigarettes a day for at least 15 years; He does smoke marijuana occasionally.  He is status post partial colectomy and chemotherapy for rectal cancer  Rewiew of Systems:   All systems negative except as marked bold or noted in the HPI;  Constitutional:    malaise, fever and chills. ;  Eyes:   eye pain, redness and discharge. ;  ENMT:   ear pain, hoarseness, nasal congestion, sinus pressure and sore throat. ;  Cardiovascular:    chest pain, palpitations, diaphoresis, dyspnea and peripheral edema.  Respiratory:   cough, hemoptysis, wheezing and stridor. ;  Gastrointestinal:  nausea, vomiting, diarrhea, constipation, abdominal pain, melena, blood in stool, hematemesis, jaundice and rectal bleeding. unusual weight loss..   Genitourinary:    frequency, dysuria, incontinence,flank pain and hematuria; Musculoskeletal:   back pain and neck pain.  swelling and trauma.;  Skin: .  pruritus, rash, abrasions, bruising and skin lesion.; ulcerations Neuro:    headache, lightheadedness and neck stiffness.  weakness, altered level of consciousness, altered mental status, extremity weakness, burning feet, involuntary movement, seizure and syncope.  Psych:    anxiety, depression,  insomnia, tearfulness, panic attacks, hallucinations, paranoia, suicidal or homicidal ideation    Past Medical History  Diagnosis Date  . Hypertension   . Cancer     rectal  . DVT (deep venous thrombosis)     Past Surgical History  Procedure Laterality Date  . Colostomy    . Abdominal surgery    . Hernia repair      Medications:  HOME MEDS: Prior to Admission medications   Medication Sig Start Date End Date Taking? Authorizing Provider  aspirin EC 81 MG tablet Take 81 mg by mouth daily.   Yes Historical Provider, MD  cilostazol (PLETAL) 100 MG tablet Take 50-100 mg by mouth 2 (two) times daily. Take 0.5-1 by mouth 2 times daily. Takes only one-half tablet per dose if headache occurs 07/01/12 07/01/13 Yes Historical Provider, MD  gabapentin (NEURONTIN) 300 MG capsule Take 900-1,200 mg by mouth 3 (three) times daily. Takes 900 mg in the morning, 1200 in the afternoon, and 1200 in the evening   Yes Historical Provider, MD  lisinopril (PRINIVIL,ZESTRIL) 40 MG tablet Take 40 mg by mouth daily.   Yes Historical Provider, MD  omeprazole (PRILOSEC) 40 MG capsule Take 40 mg by mouth daily. TAKE 1 CAPSULE EVERY DAY 01/15/13  Yes Historical Provider, MD     Allergies:  Allergies  Allergen Reactions  . Darvocet (Propoxyphene-Acetaminophen) Palpitations    Social History:   reports that he has been smoking Cigarettes.  He has been smoking about 1.00 pack per day. He does not have any smokeless tobacco history on file. He reports  that  drinks alcohol. He reports that he uses illicit drugs (Marijuana).  Family History: History reviewed. No pertinent family history. No family history of heart disease  Physical Exam: Filed Vitals:   03/28/13 2045 03/28/13 2047 03/28/13 2341  BP:  185/107 166/96  Pulse: 82  64  Temp: 97.9 F (36.6 C)    TempSrc: Oral    Resp: 18  20  Height: 5\' 9"  (1.753 m)    Weight: 86.183 kg (190 lb)    SpO2: 98%  94%   Blood pressure 166/96, pulse 64,  temperature 97.9 F (36.6 C), temperature source Oral, resp. rate 20, height 5\' 9"  (1.753 m), weight 86.183 kg (190 lb), SpO2 94.00%. Body mass index is 28.05 kg/(m^2).   GEN:  Pleasant *African American gentleman lying bed in no acute distress; cooperative with exam PSYCH:  alert and oriented x4;  anxious nor depressed; affect is appropriate. HEENT: Mucous membranes pink and anicteric; PERRLA; EOM intact; no cervical lymphadenopathy nor thyromegaly or carotid bruit; no JVD; Breasts:: Not examined CHEST WALL: No tenderness CHEST: Normal respiration, clear to auscultation bilaterally HEART: Regular rate and rhythm; no murmurs rubs or gallops BACK: No kyphosis no scoliosis; no CVA tenderness ABDOMEN: Obese, soft non-tender; no masses, no organomegaly, normal abdominal bowel sounds; no pannus; no intertriginous candida. Rectal Exam: Not done EXTREMITIES:  age-appropriate arthropathy of the hands and knees; no edema; no ulcerations. Genitalia: not examined PULSES: 2+ and symmetric SKIN: Normal hydration no rash or ulceration CNS: Cranial nerves 2-12 grossly intact no focal lateralizing neurologic deficit   Labs on Admission:  Basic Metabolic Panel:  Recent Labs Lab 03/28/13 2324  NA 138  K 4.1  CL 102  CO2 26  GLUCOSE 108*  BUN 12  CREATININE 1.20  CALCIUM 9.2   Liver Function Tests: No results found for this basename: AST, ALT, ALKPHOS, BILITOT, PROT, ALBUMIN,  in the last 168 hours No results found for this basename: LIPASE, AMYLASE,  in the last 168 hours No results found for this basename: AMMONIA,  in the last 168 hours CBC:  Recent Labs Lab 03/28/13 2324  WBC 8.6  NEUTROABS 5.9  HGB 16.5  HCT 47.5  MCV 95.8  PLT 172   Cardiac Enzymes:  Recent Labs Lab 03/28/13 2324  TROPONINI <0.30   BNP: No components found with this basename: POCBNP,  D-dimer: No components found with this basename: D-DIMER,  CBG: No results found for this basename: GLUCAP,  in the  last 168 hours  Radiological Exams on Admission: Dg Chest Portable 1 View  03/28/2013   *RADIOLOGY REPORT*  Clinical Data: Rectal cancer.  Colostomy.  Chest pain.  PORTABLE CHEST - 1 VIEW  Comparison: None.  Findings: Diffuse nonspecific interstitial prominence is present. Chronicity of unknown.  Exam is under penetrated. Cardiopericardial silhouette within normal limits.  No focal consolidation. Monitoring leads are projected over the chest.  IMPRESSION: No acute cardiopulmonary disease.  Diffuse nonspecific interstitial prominence.   Original Report Authenticated By: Dereck Ligas, M.D.    EKG: Independently reviewed. Sinus rhythm, poor R wave progression in the anteroseptal leads   Assessment/Plan  Active Problems:   Chest pain   Hypertension   Overweight   Tobacco abuse  Motivation and readiness for weight loss intervention Body mass index is 28.05 kg/(m^2). Had not been thinking about changs to diet;  has been thinking about joins [employer] and gym for excersise And discussed the different food groups fruits and vegetables versus protein and carbohydrate.  PLAN:  Bring this gentleman on observation to cycle cardiac enzymes and monitor for further chest pain; Because of his age and the radiation of the pain; he may benefit from a cardiology evaluation as an in patient or outpatient   Other plans as per orders.  Code Status: Full code Family Communication: Plans discuss with patien Disposition Plan: Likely home in a day or    Anupama Piehl Nocturnist [facility] Pager [phone]  03/29/2013, 12:58 AM

## 2013-03-29 NOTE — ED Notes (Signed)
Attempted to call report to floor.  Left message for RN

## 2013-03-29 NOTE — Progress Notes (Signed)
[facility] PROGRESS NOTE  Juan Wheeler [id number] DOB: 1961/07/04 DOA: 03/28/2013 PCP: No primary provider on file.  HPI: Juan Wheeler is a 52 y.o. male. African American gentleman who denies prior cardiac, as when having episodic central chest pain radiating up into his neck and into his left arm for the past 4 days. Describes the pain as sharp and it is worse 10 out of 10 typically last about 10-15 minutes, typically no relieving factors although he does say sometimes drinking water helps. Pain is associated with dizziness, heat, shortness of breath, and nausea; no diaphoresis. He has no family history of cardiac disease; he does smoke one pack of cigarettes a day for at least 15 years; He does smoke marijuana occasionally.  Assessment/Plan:   Chest pain - some atypical features with his pain being described as "sharp". Most features are however typical with left neck/shoulder/arm pain with finger numbness. D dimer negative. Have kindly asked cardiology to evaluate for further workup, stress vs others.  History of rectal cancer - followed at [facility], as far as she knows he is cancer free. Has follow up in few months for scan. PAD - has advanced disease per notes at [facility]. Stable. On home medications.  HTN - on home medications.  Tobacco abuse - counseled today. Willing to quit. DVT Prophylaxis - Lovenox   Code Status: Full Family Communication: none  Disposition Plan: pending cardiology evaluation  Consultants:  Cardiology  Procedures:  none  Antibiotics:  Anti-infectives   None     Antibiotics Given (last 72 hours)   None     HPI/Subjective: - no chest pain; had little this morning but did not tell anyone.   Objective: Filed Vitals:   03/29/13 0132 03/29/13 0314 03/29/13 0330 03/29/13 0650  BP: 163/100 168/99 150/93 149/75  Pulse:  67 65 66  Temp: 97.9 F (36.6 C)  97.6 F (36.4 C) 97.4 F (36.3 C)  TempSrc: Oral  Oral Oral  Resp: 18 20 20 20    Height:      Weight:      SpO2: 97% 98% 97% 97%    Intake/Output Summary (Last 24 hours) at 03/29/13 1027 Last data filed at 03/29/13 0300  Gross per 24 hour  Intake    240 ml  Output      0 ml  Net    240 ml   Filed Weights   03/28/13 2045  Weight: 86.183 kg (190 lb)    Exam:   General:  NAD  Cardiovascular: regular rate and rhythm, without MRG  Respiratory: good air movement, clear to auscultation throughout, no wheezing, ronchi or rales  Abdomen: soft, mildly tender to palpation throughout, positive bowel sounds  MSK: no peripheral edema  Neuro: CN 2-12 grossly intact, MS 5/5 in all 4  Data Reviewed: Basic Metabolic Panel:  Recent Labs Lab 03/28/13 2324 03/29/13 0528  NA 138 139  K 4.1 4.0  CL 102 105  CO2 26 25  GLUCOSE 108* 108*  BUN 12 11  CREATININE 1.20 1.07  CALCIUM 9.2 8.8  MG  --  2.0   Liver Function Tests:  Recent Labs Lab 03/29/13 0528  AST 14  ALT 7  ALKPHOS 72  BILITOT 0.5  PROT 7.0  ALBUMIN 3.1*   CBC:  Recent Labs Lab 03/28/13 2324 03/29/13 0528  WBC 8.6 6.6  NEUTROABS 5.9  --   HGB 16.5 16.2  HCT 47.5 46.7  MCV 95.8 95.9  PLT 172 174   Cardiac Enzymes:  Recent Labs Lab 03/28/13 2324 03/29/13 0516  TROPONINI <0.30 <0.30    Studies: Dg Chest Portable 1 View  03/28/2013   *RADIOLOGY REPORT*  Clinical Data: Rectal cancer.  Colostomy.  Chest pain.  PORTABLE CHEST - 1 VIEW  Comparison: None.  Findings: Diffuse nonspecific interstitial prominence is present. Chronicity of unknown.  Exam is under penetrated. Cardiopericardial silhouette within normal limits.  No focal consolidation. Monitoring leads are projected over the chest.  IMPRESSION: No acute cardiopulmonary disease.  Diffuse nonspecific interstitial prominence.   Original Report Authenticated By: Dereck Ligas, M.D.    Scheduled Meds: . aspirin EC  81 mg Oral Daily  . cilostazol  100 mg Oral BID  . enoxaparin (LOVENOX) injection  40 mg Subcutaneous Q24H   . gabapentin  1,200 mg Oral Custom  . gabapentin  900 mg Oral Daily  . lisinopril  40 mg Oral Daily  . pantoprazole  40 mg Oral BID AC  . sodium chloride  3 mL Intravenous Q12H   Continuous Infusions:   Active Problems:   Chest pain   Hypertension   Overweight   Tobacco abuse   Time spent: [place], MD [facility] Pager [phone]. If 7 PM - 7 AM, please contact night-coverage at [email or web address], password [facility] 03/29/2013, 10:27 AM  LOS: 1 day

## 2013-03-29 NOTE — Progress Notes (Signed)
UR Chart Review Completed  

## 2013-03-30 DIAGNOSIS — I10 Essential (primary) hypertension: Secondary | ICD-10-CM | POA: Diagnosis not present

## 2013-03-30 DIAGNOSIS — I739 Peripheral vascular disease, unspecified: Secondary | ICD-10-CM | POA: Diagnosis not present

## 2013-03-30 DIAGNOSIS — R079 Chest pain, unspecified: Secondary | ICD-10-CM | POA: Diagnosis not present

## 2013-03-30 DIAGNOSIS — E663 Overweight: Secondary | ICD-10-CM | POA: Diagnosis not present

## 2013-03-30 MED ORDER — NICOTINE 21 MG/24HR TD PT24: 1.0000 | MEDICATED_PATCH | TRANSDERMAL | Status: DC

## 2013-03-30 NOTE — Discharge Summary (Signed)
Physician Discharge Summary  Juan Wheeler [id number] DOB: August 22, 1961 DOA: 03/28/2013  PCP: No primary provider on file.  Admit date: 03/28/2013 Discharge date: 03/30/2013  Time spent: 40 minutes  Recommendations for Outpatient Follow-up:  1. Please follow up with cardiology as an outpatient for stress test as instructed below 2. Please follow up with PCP in 1-2 weeks post hospital stay.   Discharge Diagnoses:  Active Problems:   Chest pain   Hypertension   Overweight   Tobacco abuse  Discharge Condition: stable  Diet recommendation: heart healthy  Filed Weights   03/28/13 2045 03/30/13 0539  Weight: 86.183 kg (190 lb) 86.1 kg (189 lb 13.1 oz)    History of present illness:  Juan Wheeler is a 52 y.o. male. African American gentleman who denies prior cardiac, as when having episodic central chest pain radiating up into his neck and into his left arm for the past 4 days. Describes the pain as sharp and it is worse 10 out of 10 typically last about 10-15 minutes, typically no relieving factors although he does say sometimes drinking water helps. Pain is associated with dizziness, heat, shortness of breath, and nausea; no diaphoresis. He has no family history of cardiac disease; he does smoke one pack of cigarettes a day for at least 15 years; He does smoke marijuana occasionally.   Hospital Course:  Chest pain - some atypical features with his pain being described as "sharp". Most features are however typical with left neck/shoulder/arm pain with finger numbness. D dimer negative. Cardiology evaluated patient while hospitalized and recommended outpatient myoview which will be set up soon. Patient ambulated in the hallway with the nursing staff and denied chest pain or dyspnea with ambulation thus will undergo outpatient evaluation. He regularly sees a cardiologist at [facility] however wants to transfer his care here.  History of rectal cancer - followed at [facility], as far as she knows  he is cancer free. Has follow up in few months for scan.  PAD - has advanced disease per notes at [facility]. Stable. On home medications.  HTN - on home medications.  Tobacco abuse - counseled during hospitalization and a prescription for nicotine patches has been given on discharge. Willing to quit.   Procedures:  none   Consultations:  Cardiology  Discharge Exam: Filed Vitals:   03/29/13 1541 03/29/13 2157 03/30/13 0539 03/30/13 1008  BP: 151/95 140/84 128/89 148/94  Pulse: 60 77 80 118  Temp: 97.6 F (36.4 C) 97.7 F (36.5 C) 98 F (36.7 C) 97.4 F (36.3 C)  TempSrc: Oral Oral Oral Oral  Resp: 20 20 20 20   Height:      Weight:   86.1 kg (189 lb 13.1 oz)   SpO2: 99% 92% 90% 98%   General: NAD Cardiovascular: RRR Respiratory: CTA biL  Discharge Instructions      Future Appointments Provider Department Dept Phone   04/07/2013 11:00 AM [facility] [place] [phone]   04/14/2013 3:00 PM Lendon Colonel, NP [facility] [place]       Medication List         aspirin EC 81 MG tablet  Take 81 mg by mouth daily.     gabapentin 300 MG capsule  Commonly known as:  NEURONTIN  Take 900-1,200 mg by mouth 3 (three) times daily. Takes 900 mg in the morning, 1200 in the afternoon, and 1200 in the evening     lisinopril 40 MG tablet  Commonly known as:  PRINIVIL,ZESTRIL  Take 40 mg by mouth daily.     nicotine 21 mg/24hr patch  Commonly known as:  NICODERM CQ - dosed in mg/24 hours  Place 1 patch onto the skin daily.     omeprazole 40 MG capsule  Commonly known as:  PRILOSEC  Take 40 mg by mouth daily. TAKE 1 CAPSULE EVERY DAY     PLETAL 100 MG tablet  Generic drug:  cilostazol  Take 50-100 mg by mouth 2 (two) times daily. Take 0.5-1 by mouth 2 times daily. Takes only one-half tablet per dose if headache occurs       Follow-up Information   Follow up with [place] On 04/07/2013. (8:30 am)     Contact information:   Radiology at [facility] for stress test. Linna Hoff, [place]      Follow up with Jory Sims, NP On 04/14/2013. (3 pm)    Contact information:   [address] [place] [address] [phone]      The results of significant diagnostics from this hospitalization (including imaging, microbiology, ancillary and laboratory) are listed below for reference.    Significant Diagnostic Studies: Dg Chest Portable 1 View  03/28/2013   *RADIOLOGY REPORT*  Clinical Data: Rectal cancer.  Colostomy.  Chest pain.  PORTABLE CHEST - 1 VIEW  Comparison: None.  Findings: Diffuse nonspecific interstitial prominence is present. Chronicity of unknown.  Exam is under penetrated. Cardiopericardial silhouette within normal limits.  No focal consolidation. Monitoring leads are projected over the chest.  IMPRESSION: No acute cardiopulmonary disease.  Diffuse nonspecific interstitial prominence.   Original Report Authenticated By: Dereck Ligas, M.D.   Labs: Basic Metabolic Panel:  Recent Labs Lab 03/28/13 2324 03/29/13 0528  NA 138 139  K 4.1 4.0  CL 102 105  CO2 26 25  GLUCOSE 108* 108*  BUN 12 11  CREATININE 1.20 1.07  CALCIUM 9.2 8.8  MG  --  2.0   Liver Function Tests:  Recent Labs Lab 03/29/13 0528  AST 14  ALT 7  ALKPHOS 72  BILITOT 0.5  PROT 7.0  ALBUMIN 3.1*   CBC:  Recent Labs Lab 03/28/13 2324 03/29/13 0528  WBC 8.6 6.6  NEUTROABS 5.9  --   HGB 16.5 16.2  HCT 47.5 46.7  MCV 95.8 95.9  PLT 172 174   Cardiac Enzymes:  Recent Labs Lab 03/28/13 2324 03/29/13 0516 03/29/13 1014 03/29/13 1546  TROPONINI <0.30 <0.30 <0.30 <0.30    Signed:  Marzetta Board  [facility] 03/30/2013, 11:08 AM

## 2013-03-30 NOTE — Consult Note (Signed)
CARDIOLOGY CONSULT NOTE  Patient ID: Park Sudhoff MRN: [id number] DOB/AGE: 11-13-1960 52 y.o.  Admit date: 03/28/2013 Referring Physician: PTH Primary PhysicianNo primary provider on file. Primary Cardiologist: [facility] Dr. Genia Del) Reason for Consultation: Chest Pain Active Problems:   Chest pain   Hypertension   Overweight   Tobacco abuse  HPI: [name] is a 52 year old male patient admitted with recurrent chest pain located on the left side of his chest occurring at rest and with exertion described as sharp with some radiation to his shoulder. The patient states that the sensation sometimes wakes him up at night with associated shortness of breath, and most recently while walking to the store approximately 3 minutes from his home he began to have some pain in his upper chest with associated shortness of breath requiring him to stop. The pain lasts approximately 5 to 6 minutes and goes away on its own with rest.    He was seen in ER and ruled out for PE. Cardiac enzymes were cycled and found to be negative x3. EKG had some nonspecific T wave changes with evidence of a septal infarct. No change from prior EKG found in [facility] records. The patient has been pain-free since admission. Is anxious to return home.     He is normally followed by Dr. Genia Del at [facility], cardiologist, with history of hypertension, peripheral arterial disease with occlusion of the right internal iliac artery, with significant atherosclerosis in the left internal iliac which was not amendable to reconstruction per notes from [facility]. With ongoing intermittent claudication symptoms,. He was last seen by Dr. Gillian Scarce in February of 2014. The patient was continued on Pletal, ACE inhibitor, and aspirin. The patient states he has had a stress test in the past and was told that it was negative, however on review of [facility] notes I do not have documentation to  confirm.   The patient fortunately continues to smoke, drink alcohol. He is chronic neurologic pain, and is followed in pain management clinic at [facility]. He was last seen there in May of 2014. Other history includes colorectal cancer stage II, depression, peptic ulcer disease.   Review of systems complete and found to be negative unless listed above   Past Medical History  Diagnosis Date  . Hypertension   . Cancer     rectal  . DVT (deep venous thrombosis)     History reviewed. No pertinent family history.  History   Social History  . Marital Status: Single    Spouse Name: N/A    Number of Children: N/A  . Years of Education: N/A   Occupational History  . Not on file.   Social History Main Topics  . Smoking status: Current Every Day Smoker -- 1.00 packs/day for 15 years    Types: Cigarettes  . Smokeless tobacco: Current User  . Alcohol Use: Yes     Comment: occ  . Drug Use: Yes    Special: Marijuana  . Sexually Active: Yes    Birth Control/ Protection: None   Other Topics Concern  . Not on file   Social History Narrative  . No narrative on file    Past Surgical History  Procedure Laterality Date  . Colostomy    . Abdominal surgery    . Hernia repair       Prescriptions prior to admission  Medication Sig Dispense Refill  . aspirin EC 81 MG tablet Take 81 mg by mouth  daily.      . cilostazol (PLETAL) 100 MG tablet Take 50-100 mg by mouth 2 (two) times daily. Take 0.5-1 by mouth 2 times daily. Takes only one-half tablet per dose if headache occurs      . gabapentin (NEURONTIN) 300 MG capsule Take 900-1,200 mg by mouth 3 (three) times daily. Takes 900 mg in the morning, 1200 in the afternoon, and 1200 in the evening      . lisinopril (PRINIVIL,ZESTRIL) 40 MG tablet Take 40 mg by mouth daily.      Marland Kitchen omeprazole (PRILOSEC) 40 MG capsule Take 40 mg by mouth daily. TAKE 1 CAPSULE EVERY DAY       Echocardiogram 12/11 Normal left ventricular size Regional wall  motion abnormalities as above Mild left ventricular global hypokinesis Abnormal diastolic relaxation Normal right ventricular size Normal right ventricular function No significant valve stenosis No significant valvular regurgitation No pericardial effusion No old study for comparison  I have personally reviewed and interpreted this echocardiogram.  Signed: 09/03/2010 10:21 AM By: Valaria Good, MD   Physical Exam: Blood pressure 128/89, pulse 80, temperature 98 F (36.7 C), temperature source Oral, resp. rate 20, height 5\' 9"  (1.753 m), weight 189 lb 13.1 oz (86.1 kg), SpO2 90.00%.  General: Well developed, well nourished, in no acute distress Head: Eyes PERRLA, No xanthomas.   Normal cephalic and atramatic  Lungs: Clear bilaterally to auscultation, No wheezes or coughing. Heart: HRRR S1 S2, without MRG.  Pulses are 2+ & equal.            No carotid bruit. No JVD.  No abdominal bruits. Positive for soft left femoral bruits. Abdomen: Bowel sounds are positive, abdomen soft and non-tender without masses or                  Hernia's noted. Msk:  Back normal, normal gait. Normal strength and tone for age. Extremities: No clubbing, cyanosis or edema.  DP diminished on the left. Neuro: Alert and oriented X 3. Psych:  Good affect, responds appropriately   Labs:   Lab Results  Component Value Date   WBC 6.6 03/29/2013   HGB 16.2 03/29/2013   HCT 46.7 03/29/2013   MCV 95.9 03/29/2013   PLT 174 03/29/2013    Recent Labs Lab 03/29/13 0528  NA 139  K 4.0  CL 105  CO2 25  BUN 11  CREATININE 1.07  CALCIUM 8.8  PROT 7.0  BILITOT 0.5  ALKPHOS 72  ALT 7  AST 14  GLUCOSE 108*   Lab Results  Component Value Date   TROPONINI <0.30 03/29/2013    Lab Results  Component Value Date   CHOL 161 03/29/2013   Lab Results  Component Value Date   HDL 45 03/29/2013   Lab Results  Component Value Date   LDLCALC 96 03/29/2013   Lab Results  Component Value Date   TRIG 100 03/29/2013   Lab  Results  Component Value Date   CHOLHDL 3.6 03/29/2013   Aortagram with Run-Off 10/06/2012 [facility] The left common and external iliac arteries were widely patent with no dominant stenosis. He did have a severe stenosis of the ostia of the left hypogastric artery. Once the pigtail catheter had been repositioned down into the external iliac artery the left lower extremity runoff demonstrated a patent common femoral artery and profunda femorus branches. There was a small superficial femoral artery with a number of moderate stenoses including at the ostia and then in the mid thigh.  The vessel was patent down into the above knee popliteal segment, which then crossed the knee without any critical stenoses and gave rise to the anterior tibial artery and the tibioperoneal trunk. Both of these were diseased but patent. Then the tibial vessel runoff included dominant anterior tibial artery, which crossed the ankle but had a number of areas of stenosis along its length. The peroneal artery was occluded proximally and then had a small collateral fill and then delayed filling down towards the ankle but was diseased. The posterior tibial artery is patent but disease proximally and made it to the ankle and then across where it filled into the plantar arch, but forefoot was provided primarily by the dorsalis pedis. At this point, we felt there were no lesions that would be likely to improve his situation with an endovascular reconstruction. The small vessel disease was felt to not be amenable to endovascular reconstruction    Radiology: Dg Chest Portable 1 View  03/28/2013   *RADIOLOGY REPORT*  Clinical Data: Rectal cancer.  Colostomy.  Chest pain.  PORTABLE CHEST - 1 VIEW  Comparison: None.  Findings: Diffuse nonspecific interstitial prominence is present. Chronicity of unknown.  Exam is under penetrated. Cardiopericardial silhouette within normal limits.  No focal consolidation. Monitoring leads are projected over  the chest.  IMPRESSION: No acute cardiopulmonary disease.  Diffuse nonspecific interstitial prominence.   Original Report Authenticated By: Dereck Ligas, M.D.   EKG: NSR with rate of 68 bpm. Septal Q-wave.  ASSESSMENT AND PLAN:   1. Chest Pain: Typical and atypical features described as sharp radiating to his left shoulder, associated shortness of breath, with multiple cardiovascular risk factors. Only one episode occurred with activity  All others at rest.  The patient has been pain-free since admission. Cardiac enzymes and EKG rule out ACS. He would benefit from a stress Myoview for ongoing assessment for CAD. He is followed by cardiologist at [facility], review of records, did not demonstrate prior stress test or cardiac catheterization documentation. The patient states that he has had stress test completed in the past and was found to be negative.  He wishes to follow up in our office to avoid having to drive to [facility] for ongoing CV care. We can plan for OP stress test prior to discharge.  2. PAD: He has history of iliac disease not amenable to revascularization or intervention her [facility] hospital vascular surgeons. He continues to have intermittent claudication symptoms, remains on Pletal and aspirin, although admits not taking Pletal as directed as this causes a headache. The patient continues with neurologic pain on gabapentin.  3. Hypertension: Benefit from repeat a cardiogram this was not been completed since 2011 for review of [facility] records, he admits to skipping doses of his antihypertensive on occasion in order to make them last longer when his finances are low. However, he states he has been taken as directed recently. Pressure is well-controlled on ACE inhibitor, lisinopril 40 mg daily. Creatinine 1.07.  4. Ongoing tobacco abuse: He is been counseled on smoking cessation on several occasions for review of records by his PCP and cardiologist via [facility]. His  history and course during my assessment. Early on NicoDerm patch.  5. Chronic Pain Syndrome: He is followed by pain management clinic at [facility]. Their notes state that he has 2 different types of pain one possibly related to rectal carcinoma, and chronic neurologic pain.     Signed: Phill Myron. Purcell Nails NP [facility] 03/30/2013, 8:23 AM  Co-Sign MD  Patient seen and examined  I have amended note above by Jory Sims to reflect my findings.  Plan will be to ambulate If pain free then d/c home with outpatient myoview.

## 2013-03-30 NOTE — Progress Notes (Signed)
Pt ambulated in hallway. Pt had no SOB, chest pain, or difficulties with balance. Will continue to monitor.

## 2013-03-30 NOTE — Progress Notes (Signed)
Pt is to be discharged home today. Pt is in NAD, IV is out, all paperwork has been reviewed/discussed with patient, and there are no questions/concerns at this time. Assessment is unchanged from this morning. Pt is to be accompanied downstairs by staff and family via wheelchair.  

## 2013-03-30 NOTE — Discharge Instructions (Signed)
You were cared for by a hospitalist during your hospital stay. If you have any questions about your discharge medications or the care you received while you were in the hospital after you are discharged, you can call the unit and asked to speak with the hospitalist on call if the hospitalist that took care of you is not available. Once you are discharged, your primary care physician will handle any further medical issues. Please note that NO REFILLS for any discharge medications will be authorized once you are discharged, as it is imperative that you return to your primary care physician (or establish a relationship with a primary care physician if you do not have one) for your aftercare needs so that they can reassess your need for medications and monitor your lab values.     If you do not have a primary care physician, you can call [phone] for a physician referral.    Follow with Primary MD in 7 days   Get CBC, CMP, checked 7 days by Primary MD and again as instructed by your Primary MD. Get a 2 view Chest X ray done next visit if you had Pneumonia of Lung problems at the [facility] reviewed and adjusted.  Please request your Prim.MD to go over all Hospital Tests and Procedure/Radiological results at the follow up, please get all Hospital records sent to your Prim MD by signing hospital release before you go home.  Activity: As tolerated with Full fall precautions use walker/cane & assistance as needed   Diet:  Heart healthy,  Fluid restriction 1.8 lit/day, Aspiration precautions.  For Heart failure patients - Check your Weight same time everyday, if you gain over 2 pounds, or you develop in leg swelling, experience more shortness of breath or chest pain, call your Primary MD immediately. Follow Cardiac Low Salt Diet and 1.8 lit/day fluid restriction.  Disposition Home  If you experience worsening of your admission symptoms, develop shortness of breath, life threatening  emergency, suicidal or homicidal thoughts you must seek medical attention immediately by calling 911 or calling your MD immediately  if symptoms less severe.  You Must read complete instructions/literature along with all the possible adverse reactions/side effects for all the Medicines you take and that have been prescribed to you. Take any new Medicines after you have completely understood and accpet all the possible adverse reactions/side effects.   Do not drive and provide baby sitting services if your were admitted for syncope or siezures until you have seen by Primary MD or a Neurologist and advised to do so again.  Do not drive when taking Pain medications.    Do not take more than prescribed Pain, Sleep and Anxiety Medications  Special Instructions: If you have smoked or chewed Tobacco  in the last 2 yrs please stop smoking, stop any regular Alcohol  and or any Recreational drug use.  Wear Seat belts while driving.

## 2013-04-03 ENCOUNTER — Other Ambulatory Visit: Payer: Self-pay | Admitting: *Deleted

## 2013-04-03 DIAGNOSIS — R079 Chest pain, unspecified: Secondary | ICD-10-CM

## 2013-04-04 ENCOUNTER — Other Ambulatory Visit: Payer: Self-pay | Admitting: *Deleted

## 2013-04-04 DIAGNOSIS — R079 Chest pain, unspecified: Secondary | ICD-10-CM

## 2013-04-05 ENCOUNTER — Encounter (HOSPITAL_COMMUNITY)
Admission: RE | Admit: 2013-04-05 | Discharge: 2013-04-05 | Disposition: A | Discharge status: Home / Self Care | Payer: Medicaid Other | Source: Ambulatory Visit | Attending: Internal Medicine | Admitting: Internal Medicine

## 2013-04-05 ENCOUNTER — Ambulatory Visit (HOSPITAL_COMMUNITY)
Admission: RE | Admit: 2013-04-05 | Discharge: 2013-04-05 | Disposition: A | Discharge status: Home / Self Care | Payer: Medicaid Other | Source: Ambulatory Visit | Attending: Internal Medicine | Admitting: Internal Medicine

## 2013-04-05 ENCOUNTER — Encounter (HOSPITAL_COMMUNITY): Payer: Self-pay

## 2013-04-05 DIAGNOSIS — I10 Essential (primary) hypertension: Secondary | ICD-10-CM | POA: Insufficient documentation

## 2013-04-05 DIAGNOSIS — R079 Chest pain, unspecified: Secondary | ICD-10-CM

## 2013-04-05 DIAGNOSIS — F17200 Nicotine dependence, unspecified, uncomplicated: Secondary | ICD-10-CM | POA: Insufficient documentation

## 2013-04-05 MED ORDER — REGADENOSON 0.4 MG/5ML IV SOLN
INTRAVENOUS | Status: AC
  Administered 2013-04-05: 0.4 mg via INTRAVENOUS
  Filled 2013-04-05: qty 5

## 2013-04-05 MED ORDER — TECHNETIUM TC 99M SESTAMIBI - CARDIOLITE
10.0000 | Freq: Once | INTRAVENOUS | Status: AC | PRN
  Administered 2013-04-05: 10 via INTRAVENOUS

## 2013-04-05 MED ORDER — SODIUM CHLORIDE 0.9 % IJ SOLN
INTRAMUSCULAR | Status: AC
  Administered 2013-04-05: 10 mL via INTRAVENOUS
  Filled 2013-04-05: qty 10

## 2013-04-05 MED ORDER — TECHNETIUM TC 99M SESTAMIBI - CARDIOLITE
30.0000 | Freq: Once | INTRAVENOUS | Status: AC | PRN
  Administered 2013-04-05: 12:00:00 29 via INTRAVENOUS

## 2013-04-05 NOTE — Progress Notes (Signed)
Stress Lab Nurses Notes - Juan Wheeler  Juan Wheeler 04/05/2013 Reason for doing test: Chest Pain Type of test: Test Changed unable to reach THR on TM, Lexiscan given Nurse performing test: Gerrit Halls, RN Nuclear Medicine Tech: Dyanne Carrel Echo Tech: Not Applicable MD performing test: Myles Gip & Jory Sims NP Family MD: NPCP Test explained and consent signed: yes IV started: 22g jelco, Saline lock flushed, No redness or edema and Saline lock started in radiology Symptoms:SOB, hip & leg pain Treatment/Intervention: None Reason test stopped: protocol completed After recovery IV was: Discontinued via X-ray tech and No redness or edema Patient to return to [facility]. Med at : 12:30 Patient discharged: Home Patient's Condition upon discharge was: stable Comments: During test peak BP 178/86 & HR 109.   Recovery BP 137/97 & HR 88.   Symptoms resolved in recovery. Geanie Cooley T

## 2013-04-07 ENCOUNTER — Other Ambulatory Visit (HOSPITAL_COMMUNITY): Payer: Medicaid Other

## 2013-04-07 DIAGNOSIS — I70219 Atherosclerosis of native arteries of extremities with intermittent claudication, unspecified extremity: Secondary | ICD-10-CM | POA: Diagnosis not present

## 2013-04-14 ENCOUNTER — Encounter: Payer: Self-pay | Admitting: *Deleted

## 2013-04-14 ENCOUNTER — Ambulatory Visit (INDEPENDENT_AMBULATORY_CARE_PROVIDER_SITE_OTHER): Payer: Medicare Other | Admitting: Adult Health

## 2013-04-14 ENCOUNTER — Encounter: Payer: Self-pay | Admitting: Adult Health

## 2013-04-14 VITALS — BP 128/68 | HR 95 | Ht 69.0 in | Wt 193.0 lb

## 2013-04-14 DIAGNOSIS — F17200 Nicotine dependence, unspecified, uncomplicated: Secondary | ICD-10-CM | POA: Diagnosis not present

## 2013-04-14 DIAGNOSIS — I70219 Atherosclerosis of native arteries of extremities with intermittent claudication, unspecified extremity: Secondary | ICD-10-CM | POA: Diagnosis not present

## 2013-04-14 DIAGNOSIS — Z720 Tobacco use: Secondary | ICD-10-CM

## 2013-04-14 DIAGNOSIS — I10 Essential (primary) hypertension: Secondary | ICD-10-CM

## 2013-04-14 DIAGNOSIS — R079 Chest pain, unspecified: Secondary | ICD-10-CM | POA: Diagnosis not present

## 2013-04-14 MED ORDER — ISOSORBIDE MONONITRATE 15 MG HALF TABLET: 15.0000 mg | ORAL_TABLET | Freq: Every day | ORAL | Status: DC

## 2013-04-14 NOTE — Progress Notes (Deleted)
Name: Juan Wheeler    DOB: 10/20/1960  Age: 52 y.o.  MR#: [id number]       PCP:  No primary provider on file.      Insurance: Payor: MEDICAID [place] / Plan: MEDICAID [place] / Product Type: *No Product type* /   CC:    Chief Complaint  Patient presents with  . Chest Pain    VS Filed Vitals:   04/14/13 1455  BP: 128/68  Pulse: 95  Height: 5\' 9"  (1.753 m)  Weight: 193 lb (87.544 kg)  SpO2: 94%    Weights Current Weight  04/14/13 193 lb (87.544 kg)  03/30/13 189 lb 13.1 oz (86.1 kg)  09/26/12 193 lb (87.544 kg)    Blood Pressure  BP Readings from Last 3 Encounters:  04/14/13 128/68  03/30/13 148/94  09/26/12 160/104     Admit date:  (Not on file) Last encounter with RMR:  Visit date not found   Allergy Darvocet  Current Outpatient Prescriptions  Medication Sig Dispense Refill  . aspirin EC 81 MG tablet Take 81 mg by mouth daily.      . cilostazol (PLETAL) 100 MG tablet Take 50-100 mg by mouth 2 (two) times daily. Take 0.5-1 by mouth 2 times daily. Takes only one-half tablet per dose if headache occurs      . gabapentin (NEURONTIN) 300 MG capsule Take 900-1,200 mg by mouth 3 (three) times daily. Takes 900 mg in the morning, 1200 in the afternoon, and 1200 in the evening      . lisinopril (PRINIVIL,ZESTRIL) 40 MG tablet Take 40 mg by mouth daily.      Marland Kitchen omeprazole (PRILOSEC) 40 MG capsule Take 40 mg by mouth daily. TAKE 1 CAPSULE EVERY DAY       No current facility-administered medications for this visit.    Discontinued Meds:    Medications Discontinued During This Encounter  Medication Reason  . nicotine (NICODERM CQ - DOSED IN MG/24 HOURS) 21 mg/24hr patch Completed Course    Patient Active Problem List   Diagnosis Date Noted  . Chest pain 03/29/2013  . Overweight 03/29/2013  . Tobacco abuse 03/29/2013  . Hypertension   . Chronic pain syndrome 11/09/2012  . Pain in joint, pelvic region and thigh 11/09/2012  . Neuralgia and neuritis 10/12/2012  . Atherosclerosis  of native arteries of the extremities with intermittent claudication 08/19/2012  . Backache 03/24/2012  . Peripheral vascular disease 12/04/2011  . Hyposmolality and/or hyponatremia 12/04/2011  . Acute posthemorrhagic anemia 12/04/2011  . Compression of vein 12/04/2011  . Heartburn 12/03/2011  . Nausea with vomiting 12/03/2011  . Personal history of digestive disease 11/19/2011  . Chronic pain 11/12/2011  . Depressive disorder 09/24/2011  . Dysuria 08/31/2011  . Impotence of organic origin 08/31/2011  . Urinary frequency 08/31/2011  . Hematoma complicating a procedure 06/25/2011  . Constipation 06/05/2011  . Essential hypertension 06/05/2011  . Anal or rectal pain 06/05/2011  . Malignant neoplasm of rectum 12/02/2010    LABS    Component Value Date/Time   NA 139 03/29/2013 0528   NA 138 03/28/2013 2324   K 4.0 03/29/2013 0528   K 4.1 03/28/2013 2324   CL 105 03/29/2013 0528   CL 102 03/28/2013 2324   CO2 25 03/29/2013 0528   CO2 26 03/28/2013 2324   GLUCOSE 108* 03/29/2013 0528   GLUCOSE 108* 03/28/2013 2324   BUN 11 03/29/2013 0528   BUN 12 03/28/2013 2324   CREATININE 1.07 03/29/2013 0528  CREATININE 1.20 03/28/2013 2324   CALCIUM 8.8 03/29/2013 0528   CALCIUM 9.2 03/28/2013 2324   GFRNONAA 77* 03/29/2013 0528   GFRNONAA 67* 03/28/2013 2324   GFRAA 90* 03/29/2013 0528   GFRAA 78* 03/28/2013 2324   CMP     Component Value Date/Time   NA 139 03/29/2013 0528   K 4.0 03/29/2013 0528   CL 105 03/29/2013 0528   CO2 25 03/29/2013 0528   GLUCOSE 108* 03/29/2013 0528   BUN 11 03/29/2013 0528   CREATININE 1.07 03/29/2013 0528   CALCIUM 8.8 03/29/2013 0528   PROT 7.0 03/29/2013 0528   ALBUMIN 3.1* 03/29/2013 0528   AST 14 03/29/2013 0528   ALT 7 03/29/2013 0528   ALKPHOS 72 03/29/2013 0528   BILITOT 0.5 03/29/2013 0528   GFRNONAA 77* 03/29/2013 0528   GFRAA 90* 03/29/2013 0528       Component Value Date/Time   WBC 6.6 03/29/2013 0528   WBC 8.6 03/28/2013 2324   HGB 16.2 03/29/2013 0528   HGB 16.5 03/28/2013 2324   HCT 46.7 03/29/2013  0528   HCT 47.5 03/28/2013 2324   MCV 95.9 03/29/2013 0528   MCV 95.8 03/28/2013 2324    Lipid Panel     Component Value Date/Time   CHOL 161 03/29/2013 0517   TRIG 100 03/29/2013 0517   HDL 45 03/29/2013 0517   CHOLHDL 3.6 03/29/2013 0517   VLDL 20 03/29/2013 0517   LDLCALC 96 03/29/2013 0517    ABG No results found for this basename: phart, pco2, pco2art, po2, po2art, hco3, tco2, acidbasedef, o2sat     Lab Results  Component Value Date   TSH 1.083 03/29/2013   BNP (last 3 results) No results found for this basename: PROBNP,  in the last 8760 hours Cardiac Panel (last 3 results) No results found for this basename: CKTOTAL, CKMB, TROPONINI, RELINDX,  in the last 72 hours  Iron/TIBC/Ferritin No results found for this basename: iron, tibc, ferritin     EKG Orders placed in visit on 04/14/13  . EKG 12-LEAD     Prior Assessment and Plan Problem List as of 04/14/2013     Cardiovascular and Mediastinum   Hypertension   Atherosclerosis of native arteries of the extremities with intermittent claudication   Peripheral vascular disease   Essential hypertension   Compression of vein     Digestive   Constipation   Personal history of digestive disease   Malignant neoplasm of rectum   Nausea with vomiting     Nervous and Auditory   Neuralgia and neuritis     Genitourinary   Impotence of organic origin     Other   Chest pain   Overweight   Tobacco abuse   Backache   Chronic pain syndrome   Chronic pain   Depressive disorder   Dysuria   Heartburn   Hematoma complicating a procedure   Pain in joint, pelvic region and thigh   Hyposmolality and/or hyponatremia   Acute posthemorrhagic anemia   Anal or rectal pain   Urinary frequency       Imaging: Nm Myocar Single W/spect W/wall Motion And Ef  04/05/2013   Ordering Physician: Dorris Carnes  Reading Physician: Satira Sark  Clinical Data: 52 year old male with history of hypertension, tobacco use, peripheral arterial disease,  and recent chest pain symptoms.  He is referred now for the assessment of ischemia.  NUCLEAR MEDICINE STRESS MYOVIEW STUDY WITH SPECT AND LEFT VENTRICULAR EJECTION FRACTION  Radionuclide Data: One-day rest/stress  protocol performed with 10/30 mCi of Tc-41m Myoview.  Stress Data: The patient was exercised on a Bruce protocol for 1 minute and 16 seconds achieving a maximum workload of 4.6 mets. Heart rate increased from 69 beats per minute up to 117 beats per minute, which was only 70% of the maximum age predicted heart rate response.  Lexiscan was given to insure adequate stress response. Peak blood pressure was 178/86.  No chest pain was reported.  There were no diagnostic ST-segment abnormalities, no arrhythmias.  EKG: Baseline tracing shows sinus rhythm with poor R-wave progression, rule out old anteroseptal infarct pattern.  Scintigraphic Data: Analysis of the raw perfusion data finds adequate radiotracer uptake.  Tomographic views obtained using the short axis, vertical long axis, and horizontal long axis planes.  There is a small, mild intensity anterior defect that is fixed.  In addition to this there is a small, moderate intensity, mid to basal defect that is partially reversible.  Gated imaging reveals an EDV of 130, ESV of 76, T I D ratio 0.91, and LVEF of 42% with global hypokinesis.  IMPRESSION: Abnormal exercise/Lexiscan Myoview.  Low to intermediate risk study overall.  There were no clearly diagnostic ST-segment abnormalities.  Perfusion imaging shows a fixed defect in the anterior wall that is relatively small, cannot rule out scar but no active ischemia in this distribution.  There is a small area of ischemia in the mid to basal lateral wall. LV volumes are normal, LVEF calculated at 42% with global hypokinesis.   Original Report Authenticated By: Rozann Lesches   Dg Chest Portable 1 View  03/28/2013   *RADIOLOGY REPORT*  Clinical Data: Rectal cancer.  Colostomy.  Chest pain.  PORTABLE CHEST - 1  VIEW  Comparison: None.  Findings: Diffuse nonspecific interstitial prominence is present. Chronicity of unknown.  Exam is under penetrated. Cardiopericardial silhouette within normal limits.  No focal consolidation. Monitoring leads are projected over the chest.  IMPRESSION: No acute cardiopulmonary disease.  Diffuse nonspecific interstitial prominence.   Original Report Authenticated By: Dereck Ligas, M.D.

## 2013-04-14 NOTE — Assessment & Plan Note (Signed)
The patient has been counseled on smoking cessation.

## 2013-04-14 NOTE — Patient Instructions (Addendum)
Your physician recommends that you schedule a follow-up appointment in: AFTER YOUR PROCEDURE.    Your physician has recommended you make the following change in your medication: IMDUR 15 MG DAILY  Your physician recommends that you return for lab work today.  Your physician has requested that you have a cardiac catheterization. Cardiac catheterization is used to diagnose and/or treat various heart conditions. Doctors may recommend this procedure for a number of different reasons. The most common reason is to evaluate chest pain. Chest pain can be a symptom of coronary artery disease (CAD), and cardiac catheterization can show whether plaque is narrowing or blocking your heart's arteries. This procedure is also used to evaluate the valves, as well as measure the blood flow and oxygen levels in different parts of your heart. For further information please visit [email or web address]. Please follow instruction sheet, as given.

## 2013-04-14 NOTE — Assessment & Plan Note (Signed)
He continues on Pletal, and has had some improvement in symptoms.

## 2013-04-14 NOTE — Progress Notes (Signed)
   HPI: [name] is a 52 year old patient here to be est. in our office with Dr. Pennelope Bracken, after admission for chest pain at [facility]. The patient chest pain was found to be atypical described as sharp left neck shoulder and arm. The patient was recommended for an outpatient stress Myoview. Stay was described as abnormal but low to intermediate risk study overall. Perfusion imaging showed a fixed defect in the anterior wall that is relatively small, cannot rule out scar but no active ischemia in this distribution. There is a small area of ischemia in the mid to basal and lateral wall LV volumes are normal with an LVEF of 42% with global hypokinesis.   He continues complaints of chest discomfort. He stenosis especially at nighttime. He states it lasts anywhere from a few minutes to an hour with no radiation. He has occasional shortness of breath associated with this.  Allergies  Allergen Reactions  . Darvocet (Propoxyphene-Acetaminophen) Palpitations    Current Outpatient Prescriptions  Medication Sig Dispense Refill  . aspirin EC 81 MG tablet Take 81 mg by mouth daily.      . cilostazol (PLETAL) 100 MG tablet Take 50-100 mg by mouth 2 (two) times daily. Take 0.5-1 by mouth 2 times daily. Takes only one-half tablet per dose if headache occurs      . gabapentin (NEURONTIN) 300 MG capsule Take 900-1,200 mg by mouth 3 (three) times daily. Takes 900 mg in the morning, 1200 in the afternoon, and 1200 in the evening      . lisinopril (PRINIVIL,ZESTRIL) 40 MG tablet Take 40 mg by mouth daily.      Marland Kitchen omeprazole (PRILOSEC) 40 MG capsule Take 40 mg by mouth daily. TAKE 1 CAPSULE EVERY DAY      . isosorbide mononitrate (IMDUR) 15 mg TB24 Take 0.5 tablets (15 mg total) by mouth daily.  30 tablet  6   No current facility-administered medications for this visit.    Past Medical History  Diagnosis Date  . Hypertension   . Cancer     rectal  . DVT (deep venous thrombosis)     Past  Surgical History  Procedure Laterality Date  . Colostomy    . Abdominal surgery    . Hernia repair      [id number] of systems complete and found to be negative unless listed above   PHYSICAL EXAM BP 128/68  Pulse 95  Ht 5\' 9"  (1.753 m)  Wt 193 lb (87.544 kg)  BMI 28.49 kg/m2  SpO2 94%  General: Well developed, well nourished, in no acute distress Head: Eyes PERRLA, No xanthomas.   Normal cephalic and atramatic  Lungs: Clear bilaterally to auscultation and percussion. Heart: HRRR S1 S2, without MRG.  Pulses are 2+ & equal.            No carotid bruit. No JVD.  No abdominal bruits Abdomen: Bowel sounds are positive, abdomen soft and non-tender without masses or                  Hernia's noted. Msk:  Back normal, normal gait. Normal strength and tone for age. Extremities: No clubbing, cyanosis or edema.  DP +1 Neuro: Alert and oriented X 3. Psych:  Good affect, responds appropriately  EKG: Normal sinus rhythm with PR interval of 0.10. T-wave inversion noted in the lateral leads, and in leads II,II AvF. Rate of 99 beats per minute  ASSESSMENT AND PLAN

## 2013-04-14 NOTE — Assessment & Plan Note (Signed)
He currently is well-controlled with a blood pressure 120/68 on this evaluation. He will continue on lisinopril 40 mg daily. I have given him a low dose of isosorbide 15mg  daily for for chest pain.. This certainly can be discontinued if he has no CAD, or has hypotensive response.

## 2013-04-14 NOTE — Assessment & Plan Note (Signed)
The patient had an abnormal stress Myoview demonstrating a fixed defect in anterior wall that is relatively small, cannot rule out scar but no active ischemia in that distribution. However there was a small area of ischemia in the mid to basal lateral wall. LVEF is 42% with global hypokinesis. The patient has ongoing chest discomfort which he noticed mostly at nighttime. With history of peripheral arterial disease, and hypertension. The patient will likely benefit from diagnostic cardiac catheterization for definitive evaluation of CAD. I have discussed this with Dr. Pennelope Bracken on-site, who has reviewed the chart and is in agreement that the patient would benefit from cath. I have discussed risks and benefits to include bleeding IV dye reaction and death. The patient verbalizes understanding and is willing to proceed. He has been provided the literature concerning cardiac catheterization in written form for him to review prior to the catheterization as well. Precath orders are written he is scheduled for 04/17/2013.

## 2013-04-15 LAB — CBC
HCT: 47.2 % (ref 39.0–52.0)
Hemoglobin: 16.9 g/dL (ref 13.0–17.0)
MCH: 33.3 pg (ref 26.0–34.0)
MCHC: 35.8 g/dL (ref 30.0–36.0)
MCV: 93.1 fL (ref 78.0–100.0)
Platelets: 201 10*3/uL (ref 150–400)
RBC: 5.07 MIL/uL (ref 4.22–5.81)
RDW: 15.1 % (ref 11.5–15.5)
WBC: 5.9 10*3/uL (ref 4.0–10.5)

## 2013-04-15 LAB — APTT: aPTT: 34.9 seconds (ref 24–37)

## 2013-04-15 LAB — PROTIME-INR
INR: 0.9 (ref <1.50)
Prothrombin Time: 12 seconds (ref 11.6–15.2)

## 2013-04-15 LAB — BASIC METABOLIC PANEL
BUN: 14 mg/dL (ref 6–23)
CO2: 28 mEq/L (ref 19–32)
Calcium: 9.4 mg/dL (ref 8.4–10.5)
Chloride: 108 mEq/L (ref 96–112)
Creat: 1.1 mg/dL (ref 0.50–1.35)
Glucose, Bld: 104 mg/dL — ABNORMAL HIGH (ref 70–99)
Potassium: 4.3 mEq/L (ref 3.5–5.3)
Sodium: 143 mEq/L (ref 135–145)

## 2013-04-17 ENCOUNTER — Telehealth: Payer: Self-pay | Admitting: *Deleted

## 2013-04-17 ENCOUNTER — Ambulatory Visit (HOSPITAL_COMMUNITY)
Admission: RE | Admit: 2013-04-17 | Discharge: 2013-04-18 | Disposition: A | Discharge status: Home / Self Care | Payer: Medicaid Other | Source: Ambulatory Visit | Attending: Cardiology | Admitting: Cardiology

## 2013-04-17 ENCOUNTER — Encounter (HOSPITAL_COMMUNITY)
Admission: RE | Disposition: A | Discharge status: Home / Self Care | Payer: Self-pay | Source: Ambulatory Visit | Attending: Cardiology

## 2013-04-17 ENCOUNTER — Encounter (HOSPITAL_COMMUNITY): Payer: Self-pay | Admitting: General Practice

## 2013-04-17 DIAGNOSIS — R079 Chest pain, unspecified: Secondary | ICD-10-CM

## 2013-04-17 DIAGNOSIS — F17200 Nicotine dependence, unspecified, uncomplicated: Secondary | ICD-10-CM | POA: Insufficient documentation

## 2013-04-17 DIAGNOSIS — Z7982 Long term (current) use of aspirin: Secondary | ICD-10-CM | POA: Insufficient documentation

## 2013-04-17 DIAGNOSIS — G8929 Other chronic pain: Secondary | ICD-10-CM | POA: Insufficient documentation

## 2013-04-17 DIAGNOSIS — I70209 Unspecified atherosclerosis of native arteries of extremities, unspecified extremity: Secondary | ICD-10-CM | POA: Insufficient documentation

## 2013-04-17 DIAGNOSIS — I10 Essential (primary) hypertension: Secondary | ICD-10-CM | POA: Insufficient documentation

## 2013-04-17 DIAGNOSIS — Z85048 Personal history of other malignant neoplasm of rectum, rectosigmoid junction, and anus: Secondary | ICD-10-CM | POA: Insufficient documentation

## 2013-04-17 DIAGNOSIS — Z79899 Other long term (current) drug therapy: Secondary | ICD-10-CM | POA: Insufficient documentation

## 2013-04-17 DIAGNOSIS — Z955 Presence of coronary angioplasty implant and graft: Secondary | ICD-10-CM

## 2013-04-17 DIAGNOSIS — I70219 Atherosclerosis of native arteries of extremities with intermittent claudication, unspecified extremity: Secondary | ICD-10-CM

## 2013-04-17 DIAGNOSIS — I739 Peripheral vascular disease, unspecified: Secondary | ICD-10-CM | POA: Diagnosis present

## 2013-04-17 DIAGNOSIS — Z86718 Personal history of other venous thrombosis and embolism: Secondary | ICD-10-CM | POA: Insufficient documentation

## 2013-04-17 DIAGNOSIS — I200 Unstable angina: Secondary | ICD-10-CM | POA: Diagnosis present

## 2013-04-17 DIAGNOSIS — I2510 Atherosclerotic heart disease of native coronary artery without angina pectoris: Secondary | ICD-10-CM | POA: Insufficient documentation

## 2013-04-17 DIAGNOSIS — Z720 Tobacco use: Secondary | ICD-10-CM | POA: Diagnosis present

## 2013-04-17 HISTORY — DX: Atherosclerotic heart disease of native coronary artery without angina pectoris: I25.10

## 2013-04-17 HISTORY — DX: Peripheral vascular disease, unspecified: I73.9

## 2013-04-17 HISTORY — DX: Heart disease, unspecified: I51.9

## 2013-04-17 HISTORY — DX: Tobacco use: Z72.0

## 2013-04-17 HISTORY — DX: Other chronic pain: G89.29

## 2013-04-17 HISTORY — DX: Malignant neoplasm of rectum: C20

## 2013-04-17 HISTORY — PX: CORONARY ANGIOPLASTY WITH STENT PLACEMENT: SHX49

## 2013-04-17 HISTORY — DX: Low back pain: M54.5

## 2013-04-17 HISTORY — DX: Personal history of other medical treatment: Z92.89

## 2013-04-17 HISTORY — PX: LEFT HEART CATHETERIZATION WITH CORONARY ANGIOGRAM: SHX5451

## 2013-04-17 HISTORY — DX: Low back pain, unspecified: M54.50

## 2013-04-17 HISTORY — DX: Gastro-esophageal reflux disease without esophagitis: K21.9

## 2013-04-17 HISTORY — PX: PERCUTANEOUS STENT INTERVENTION: SHX5500

## 2013-04-17 LAB — POCT ACTIVATED CLOTTING TIME: Activated Clotting Time: 524 seconds

## 2013-04-17 SURGERY — LEFT HEART CATHETERIZATION WITH CORONARY ANGIOGRAM
Anesthesia: LOCAL

## 2013-04-17 MED ORDER — LIDOCAINE HCL (PF) 1 % IJ SOLN
INTRAMUSCULAR | Status: AC
  Filled 2013-04-17: qty 30

## 2013-04-17 MED ORDER — PRASUGREL HCL 10 MG PO TABS
10.0000 mg | ORAL_TABLET | Freq: Every day | ORAL | Status: DC
  Administered 2013-04-18: 10 mg via ORAL
  Filled 2013-04-17 (×2): qty 1

## 2013-04-17 MED ORDER — SODIUM CHLORIDE 0.9 % IJ SOLN: 3.0000 mL | Freq: Two times a day (BID) | INTRAMUSCULAR | Status: DC

## 2013-04-17 MED ORDER — GABAPENTIN 600 MG PO TABS
1200.0000 mg | ORAL_TABLET | ORAL | Status: DC
  Administered 2013-04-17 (×2): 1200 mg via ORAL
  Filled 2013-04-17 (×4): qty 2

## 2013-04-17 MED ORDER — SODIUM CHLORIDE 0.9 % IV SOLN: 0.2500 mg/kg/h | INTRAVENOUS | Status: AC

## 2013-04-17 MED ORDER — FENTANYL CITRATE 0.05 MG/ML IJ SOLN
INTRAMUSCULAR | Status: AC
  Filled 2013-04-17: qty 2

## 2013-04-17 MED ORDER — BIVALIRUDIN 250 MG IV SOLR
INTRAVENOUS | Status: AC
  Filled 2013-04-17: qty 250

## 2013-04-17 MED ORDER — ISOSORBIDE MONONITRATE 15 MG HALF TABLET
15.0000 mg | ORAL_TABLET | Freq: Every day | ORAL | Status: DC
  Administered 2013-04-17 – 2013-04-18 (×2): 15 mg via ORAL
  Filled 2013-04-17 (×2): qty 1

## 2013-04-17 MED ORDER — ACETAMINOPHEN 325 MG PO TABS: 650.0000 mg | ORAL_TABLET | ORAL | Status: DC | PRN

## 2013-04-17 MED ORDER — PANTOPRAZOLE SODIUM 40 MG PO TBEC
40.0000 mg | DELAYED_RELEASE_TABLET | Freq: Every day | ORAL | Status: DC
  Administered 2013-04-17 – 2013-04-18 (×2): 40 mg via ORAL
  Filled 2013-04-17 (×2): qty 1

## 2013-04-17 MED ORDER — MIDAZOLAM HCL 2 MG/2ML IJ SOLN
INTRAMUSCULAR | Status: AC
  Filled 2013-04-17: qty 2

## 2013-04-17 MED ORDER — SODIUM CHLORIDE 0.9 % IJ SOLN: 3.0000 mL | INTRAMUSCULAR | Status: DC | PRN

## 2013-04-17 MED ORDER — NITROGLYCERIN 0.2 MG/ML ON CALL CATH LAB
INTRAVENOUS | Status: AC
  Filled 2013-04-17: qty 1

## 2013-04-17 MED ORDER — ATORVASTATIN CALCIUM 80 MG PO TABS
80.0000 mg | ORAL_TABLET | Freq: Every day | ORAL | Status: DC
  Administered 2013-04-17: 80 mg via ORAL
  Filled 2013-04-17 (×2): qty 1

## 2013-04-17 MED ORDER — VERAPAMIL HCL 2.5 MG/ML IV SOLN
INTRAVENOUS | Status: AC
  Filled 2013-04-17: qty 2

## 2013-04-17 MED ORDER — PRASUGREL HCL 10 MG PO TABS
ORAL_TABLET | ORAL | Status: AC
  Filled 2013-04-17: qty 6

## 2013-04-17 MED ORDER — SODIUM CHLORIDE 0.9 % IV SOLN: 1.0000 mL/kg/h | INTRAVENOUS | Status: AC

## 2013-04-17 MED ORDER — GABAPENTIN 300 MG PO CAPS: 900.0000 mg | ORAL_CAPSULE | Freq: Three times a day (TID) | ORAL | Status: DC

## 2013-04-17 MED ORDER — HEPARIN (PORCINE) IN NACL 2-0.9 UNIT/ML-% IJ SOLN
INTRAMUSCULAR | Status: AC
  Filled 2013-04-17: qty 1000

## 2013-04-17 MED ORDER — ASPIRIN 81 MG PO CHEW
324.0000 mg | CHEWABLE_TABLET | ORAL | Status: AC
  Administered 2013-04-17: 324 mg via ORAL
  Filled 2013-04-17: qty 4

## 2013-04-17 MED ORDER — ASPIRIN EC 81 MG PO TBEC
81.0000 mg | DELAYED_RELEASE_TABLET | Freq: Every day | ORAL | Status: DC
  Administered 2013-04-18: 10:00:00 81 mg via ORAL
  Filled 2013-04-17: qty 1

## 2013-04-17 MED ORDER — GABAPENTIN 300 MG PO CAPS
900.0000 mg | ORAL_CAPSULE | Freq: Every day | ORAL | Status: DC
  Administered 2013-04-18: 900 mg via ORAL
  Filled 2013-04-17: qty 3

## 2013-04-17 MED ORDER — SODIUM CHLORIDE 0.9 % IV SOLN: 250.0000 mL | INTRAVENOUS | Status: DC | PRN

## 2013-04-17 MED ORDER — ONDANSETRON HCL 4 MG/2ML IJ SOLN: 4.0000 mg | Freq: Four times a day (QID) | INTRAMUSCULAR | Status: DC | PRN

## 2013-04-17 MED ORDER — HEPARIN SODIUM (PORCINE) 1000 UNIT/ML IJ SOLN
INTRAMUSCULAR | Status: AC
  Filled 2013-04-17: qty 1

## 2013-04-17 MED ORDER — SODIUM CHLORIDE 0.9 % IV SOLN
INTRAVENOUS | Status: DC
  Administered 2013-04-17: 09:00:00 via INTRAVENOUS

## 2013-04-17 MED ORDER — LISINOPRIL 40 MG PO TABS
40.0000 mg | ORAL_TABLET | Freq: Every day | ORAL | Status: DC
  Administered 2013-04-18: 07:00:00 40 mg via ORAL
  Filled 2013-04-17 (×2): qty 1

## 2013-04-17 NOTE — Progress Notes (Signed)
TR BAND REMOVAL  LOCATION:    right radial  DEFLATED PER PROTOCOL:    yes  TIME BAND OFF / DRESSING APPLIED:    1605   SITE UPON ARRIVAL:    Level 0  SITE AFTER BAND REMOVAL:    Level 0  REVERSE ALLEN'S TEST:     positive  CIRCULATION SENSATION AND MOVEMENT:    Within Normal Limits   yes  COMMENTS:   Tolerated procedure well

## 2013-04-17 NOTE — Interval H&P Note (Signed)
History and Physical Interval Note:  04/17/2013 10:37 AM  Juan Wheeler  has presented today for surgery, with the diagnosis of ABDNORMAL CV STUDY  The various methods of treatment have been discussed with the patient and family. After consideration of risks, benefits and other options for treatment, the patient has consented to  Procedure(s): LEFT HEART CATHETERIZATION WITH CORONARY ANGIOGRAM (N/A) as a surgical intervention .  The patient's history has been reviewed, patient examined, no change in status, stable for surgery.  I have reviewed the patient's chart and labs.  Questions were answered to the patient's satisfaction.   Cath Lab Visit (complete for each Cath Lab visit)  Clinical Evaluation Leading to the Procedure:   ACS: yes  Non-ACS:    Anginal Classification: CCS III  Anti-ischemic medical therapy: Minimal Therapy (1 class of medications)  Non-Invasive Test Results: Intermediate-risk stress test findings: cardiac mortality 1-3%/year  Prior CABG: No previous CABG        Collier Salina [facility] 04/17/2013 10:37 AM

## 2013-04-17 NOTE — Telephone Encounter (Signed)
PT HAD RX CALLED ION FOR IMDUR 15MG  1/2 TAB DAILY-LOWEST DOSE IT COMES IN IS 30MG  AND NOT ABLE TO CUT IN HALF WITH PRECISION.

## 2013-04-17 NOTE — H&P (View-Only) (Signed)
   HPI: Juan Wheeler is a 52 year old patient here to be est. in our office with Dr. Pennelope Bracken, after admission for chest pain at [facility]. The patient chest pain was found to be atypical described as sharp left neck shoulder and arm. The patient was recommended for an outpatient stress Myoview. Stay was described as abnormal but low to intermediate risk study overall. Perfusion imaging showed a fixed defect in the anterior wall that is relatively small, cannot rule out scar but no active ischemia in this distribution. There is a small area of ischemia in the mid to basal and lateral wall LV volumes are normal with an LVEF of 42% with global hypokinesis.   He continues complaints of chest discomfort. He stenosis especially at nighttime. He states it lasts anywhere from a few minutes to an hour with no radiation. He has occasional shortness of breath associated with this.  Allergies  Allergen Reactions  . Darvocet (Propoxyphene-Acetaminophen) Palpitations    Current Outpatient Prescriptions  Medication Sig Dispense Refill  . aspirin EC 81 MG tablet Take 81 mg by mouth daily.      . cilostazol (PLETAL) 100 MG tablet Take 50-100 mg by mouth 2 (two) times daily. Take 0.5-1 by mouth 2 times daily. Takes only one-half tablet per dose if headache occurs      . gabapentin (NEURONTIN) 300 MG capsule Take 900-1,200 mg by mouth 3 (three) times daily. Takes 900 mg in the morning, 1200 in the afternoon, and 1200 in the evening      . lisinopril (PRINIVIL,ZESTRIL) 40 MG tablet Take 40 mg by mouth daily.      Marland Kitchen omeprazole (PRILOSEC) 40 MG capsule Take 40 mg by mouth daily. TAKE 1 CAPSULE EVERY DAY      . isosorbide mononitrate (IMDUR) 15 mg TB24 Take 0.5 tablets (15 mg total) by mouth daily.  30 tablet  6   No current facility-administered medications for this visit.    Past Medical History  Diagnosis Date  . Hypertension   . Cancer     rectal  . DVT (deep venous thrombosis)     Past  Surgical History  Procedure Laterality Date  . Colostomy    . Abdominal surgery    . Hernia repair      [id number] of systems complete and found to be negative unless listed above   PHYSICAL EXAM BP 128/68  Pulse 95  Ht 5\' 9"  (1.753 m)  Wt 193 lb (87.544 kg)  BMI 28.49 kg/m2  SpO2 94%  General: Well developed, well nourished, in no acute distress Head: Eyes PERRLA, No xanthomas.   Normal cephalic and atramatic  Lungs: Clear bilaterally to auscultation and percussion. Heart: HRRR S1 S2, without MRG.  Pulses are 2+ & equal.            No carotid bruit. No JVD.  No abdominal bruits Abdomen: Bowel sounds are positive, abdomen soft and non-tender without masses or                  Hernia's noted. Msk:  Back normal, normal gait. Normal strength and tone for age. Extremities: No clubbing, cyanosis or edema.  DP +1 Neuro: Alert and oriented X 3. Psych:  Good affect, responds appropriately  EKG: Normal sinus rhythm with PR interval of 0.10. T-wave inversion noted in the lateral leads, and in leads II,II AvF. Rate of 99 beats per minute  ASSESSMENT AND PLAN

## 2013-04-17 NOTE — CV Procedure (Signed)
   Cardiac Catheterization Procedure Note  Name: Juan Wheeler MRN: [id number] DOB: [birth year]  Procedure: Left Heart Cath, Selective Coronary Angiography, LV angiography, PTCA and stenting of the left circumflex artery.  Indication: 52 yo BM recently admitted for unstable angina. He ruled out for MI and was DC home. Outpatient myoview study was intermediate risk. Last night patient experienced severe chest pain but did not seek medical care.  Procedural Details:  The right wrist was prepped, draped, and anesthetized with 1% lidocaine. Using the modified Seldinger technique, a 5 French sheath was introduced into the right radial artery. 3 mg of verapamil was administered through the sheath, weight-based unfractionated heparin was administered intravenously. Standard Judkins catheters were used for selective coronary angiography and left ventriculography. Catheter exchanges were performed over an exchange length guidewire.  PROCEDURAL FINDINGS Hemodynamics: AO 132/80 mean 100 mm Hg LV 131/6 mm Hg   Coronary angiography: Coronary dominance: right  Left mainstem: Normal   Left anterior descending (LAD): The LAD is a large vessel that extends around the apex.There is 30% diffuse disease in the proximal vessel. In the distal LAD there is a focal 70-80% lesion.  Left circumflex (LCx): There is a focal 95% stenosis in the proximal LCX. This plaque appears ulcerated.  Right coronary artery (RCA): There is 30-40% disease in the proximal vessel.  Left ventriculography: Left ventricular systolic function is normal, LVEF is estimated at 45%, there is basal to mid inferior wall hypokinesis, there is no significant mitral regurgitation   PCI Note:  Following the diagnostic procedure, the decision was made to proceed with PCI of the culprit lesion in the LCX.Marland Kitchen  Weight-based bivalirudin was given for anticoagulation. Effient 60 mg was given orally. Once a therapeutic ACT was achieved, a 6 [place] XBLAD  3.5 guide catheter was inserted.  A prowater coronary guidewire was used to cross the lesion.  The lesion was predilated with a 2.5 mm  balloon.  The lesion was then stented with a 3.0 x 12 mm Promus stent.  The stent was postdilated with a 3.0 mm  noncompliant balloon.  Following PCI, there was 0% residual stenosis and TIMI-3 flow. Final angiography confirmed an excellent result. The patient tolerated the procedure well. There were no immediate procedural complications. A TR band was used for radial hemostasis. The patient was transferred to the post catheterization recovery area for further monitoring.  PCI Data: Vessel - Left circumflex /Segment - proximal Percent Stenosis (pre)  95% TIMI-flow 3 Stent 3.0 x 12 Promus  Percent Stenosis (post) 0% TIMI-flow (post) 3  Final Conclusions:   1. 2 vessel obstructive CAD with culprit lesion in the proximal LCx. There is moderate obstructive disease in the distal LAD. 2. Mild LV dysfunction. 3. Successful stenting of the proximal LCx with a DES.   Recommendations:  Dual antiplatelet therapy for one year. Risk factor modification. If patient has refractory angina despite 2 antianginal drugs could consider PCI of the distal LAD.  Collier Salina [facility] 04/17/2013, 11:21 AM

## 2013-04-18 ENCOUNTER — Encounter (HOSPITAL_COMMUNITY): Payer: Self-pay | Admitting: Physician Assistant

## 2013-04-18 DIAGNOSIS — F17200 Nicotine dependence, unspecified, uncomplicated: Secondary | ICD-10-CM

## 2013-04-18 DIAGNOSIS — I200 Unstable angina: Secondary | ICD-10-CM

## 2013-04-18 DIAGNOSIS — I70219 Atherosclerosis of native arteries of extremities with intermittent claudication, unspecified extremity: Secondary | ICD-10-CM

## 2013-04-18 LAB — CBC
HCT: 45.7 % (ref 39.0–52.0)
Hemoglobin: 16 g/dL (ref 13.0–17.0)
MCH: 33.3 pg (ref 26.0–34.0)
MCHC: 35 g/dL (ref 30.0–36.0)
MCV: 95.2 fL (ref 78.0–100.0)
Platelets: 180 10*3/uL (ref 150–400)
RBC: 4.8 MIL/uL (ref 4.22–5.81)
RDW: 15.4 % (ref 11.5–15.5)
WBC: 7 10*3/uL (ref 4.0–10.5)

## 2013-04-18 LAB — BASIC METABOLIC PANEL
BUN: 11 mg/dL (ref 6–23)
CO2: 24 mEq/L (ref 19–32)
Calcium: 8.7 mg/dL (ref 8.4–10.5)
Chloride: 104 mEq/L (ref 96–112)
Creatinine, Ser: 1.15 mg/dL (ref 0.50–1.35)
GFR calc Af Amer: 82 mL/min — ABNORMAL LOW (ref >90)
GFR calc non Af Amer: 71 mL/min — ABNORMAL LOW (ref >90)
Glucose, Bld: 106 mg/dL — ABNORMAL HIGH (ref 70–99)
Potassium: 3.5 mEq/L (ref 3.5–5.1)
Sodium: 139 mEq/L (ref 135–145)

## 2013-04-18 MED ORDER — AMLODIPINE BESYLATE 5 MG PO TABS: 5.0000 mg | ORAL_TABLET | Freq: Every day | ORAL | Status: DC

## 2013-04-18 MED ORDER — PRASUGREL HCL 10 MG PO TABS: 10.0000 mg | ORAL_TABLET | Freq: Every day | ORAL | Status: DC

## 2013-04-18 MED ORDER — NITROGLYCERIN 0.4 MG SL SUBL: 0.4000 mg | SUBLINGUAL_TABLET | SUBLINGUAL | Status: DC | PRN

## 2013-04-18 MED ORDER — ISOSORBIDE MONONITRATE ER 30 MG PO TB24: 15.0000 mg | ORAL_TABLET | Freq: Every day | ORAL | Status: DC

## 2013-04-18 MED ORDER — AMLODIPINE BESYLATE 5 MG PO TABS
5.0000 mg | ORAL_TABLET | Freq: Every day | ORAL | Status: DC
  Administered 2013-04-18: 10:00:00 5 mg via ORAL
  Filled 2013-04-18: qty 1

## 2013-04-18 MED ORDER — ATORVASTATIN CALCIUM 80 MG PO TABS: 80.0000 mg | ORAL_TABLET | Freq: Every evening | ORAL | Status: DC

## 2013-04-18 MED FILL — Sodium Chloride IV Soln 0.9%: INTRAVENOUS | Qty: 50 | Status: AC

## 2013-04-18 NOTE — Discharge Summary (Signed)
Patient seen and examined and history reviewed. Agree with above findings and plan. See earlier rounding note.   Juan Wheeler 04/18/2013 11:35 AM

## 2013-04-18 NOTE — Care Management Note (Signed)
    Page 1 of 1   04/18/2013     11:57:00 AM   CARE MANAGEMENT NOTE 04/18/2013  Patient:  Juan Wheeler, Juan Wheeler   Account Number:  [id number]  Date Initiated:  04/18/2013  Documentation initiated by:  Fuller Mandril  Subjective/Objective Assessment:   52yo male admitted with abnormal CV study//home with spouse     Action/Plan:   left heart cath//benefits check for Effient 10mg  QD   Anticipated DC Date:  04/18/2013   Anticipated DC Plan:  [place]  CM consult      Choice offered to / List presented to:             Status of service:   Medicare Important Message given?   (If response is "NO", the following Medicare IM given date fields will be blank) Date Medicare IM given:   Date Additional Medicare IM given:    Discharge Disposition:    Per UR Regulation:    If discussed at Long Length of Stay Meetings, dates discussed:    Comments:  04/18/13 @ 1100.Marland KitchenMarland KitchenFuller Mandril, RN, BSN, [place] [phone] Spoke to pt at bedside regarding benefits check of Effient 10mg  QD.  Pt utilizes [facility] in [place] for prescription needs.  [facility] to ensure medication in stock.  Pt has Effient brochure with 30day free card and prescription assistance card.  Benefits check in process; will notifiy pt when information becomes available.

## 2013-04-18 NOTE — Progress Notes (Signed)
TELEMETRY: Reviewed telemetry pt in NSR: Filed Vitals:   04/17/13 1702 04/17/13 2012 04/18/13 0038 04/18/13 0514  BP: 165/72 133/80 157/73 164/90  Pulse: 63 83 95 85  Temp: 97.6 F (36.4 C) 98.5 F (36.9 C) 98.2 F (36.8 C) 98.1 F (36.7 C)  TempSrc: Oral Oral Oral Oral  Resp: 18 16 14    Height:      Weight:   193 lb 2 oz (87.6 kg)   SpO2: 97% 95% 93% 96%    Intake/Output Summary (Last 24 hours) at 04/18/13 0819 Last data filed at 04/17/13 1900  Gross per 24 hour  Intake 902.93 ml  Output    600 ml  Net 302.93 ml    SUBJECTIVE Feels well. No recurrent chest pain. No SOB.  LABS: Basic Metabolic Panel:  Recent Labs  04/18/13 0545  NA 139  K 3.5  CL 104  CO2 24  GLUCOSE 106*  BUN 11  CREATININE 1.15  CALCIUM 8.7   CBC:  Recent Labs  04/18/13 0545  WBC 7.0  HGB 16.0  HCT 45.7  MCV 95.2  PLT 180   Radiology/Studies:  Nm Myocar Single W/spect W/wall Motion And Ef  04/05/2013   Ordering Physician: Dorris Carnes  Reading Physician: Satira Sark  Clinical Data: 52 year old male with history of hypertension, tobacco use, peripheral arterial disease, and recent chest pain symptoms.  He is referred now for the assessment of ischemia.  NUCLEAR MEDICINE STRESS MYOVIEW STUDY WITH SPECT AND LEFT VENTRICULAR EJECTION FRACTION  Radionuclide Data: One-day rest/stress protocol performed with 10/30 mCi of Tc-6m Myoview.  Stress Data: The patient was exercised on a Bruce protocol for 1 minute and 16 seconds achieving a maximum workload of 4.6 mets. Heart rate increased from 69 beats per minute up to 117 beats per minute, which was only 70% of the maximum age predicted heart rate response.  Lexiscan was given to insure adequate stress response. Peak blood pressure was 178/86.  No chest pain was reported.  There were no diagnostic ST-segment abnormalities, no arrhythmias.  EKG: Baseline tracing shows sinus rhythm with poor R-wave progression, rule out old anteroseptal infarct  pattern.  Scintigraphic Data: Analysis of the raw perfusion data finds adequate radiotracer uptake.  Tomographic views obtained using the short axis, vertical long axis, and horizontal long axis planes.  There is a small, mild intensity anterior defect that is fixed.  In addition to this there is a small, moderate intensity, mid to basal defect that is partially reversible.  Gated imaging reveals an EDV of 130, ESV of 76, T I D ratio 0.91, and LVEF of 42% with global hypokinesis.  IMPRESSION: Abnormal exercise/Lexiscan Myoview.  Low to intermediate risk study overall.  There were no clearly diagnostic ST-segment abnormalities.  Perfusion imaging shows a fixed defect in the anterior wall that is relatively small, cannot rule out scar but no active ischemia in this distribution.  There is a small area of ischemia in the mid to basal lateral wall. LV volumes are normal, LVEF calculated at 42% with global hypokinesis.   Original Report Authenticated By: Rozann Lesches   Dg Chest Portable 1 View  03/28/2013   *RADIOLOGY REPORT*  Clinical Data: Rectal cancer.  Colostomy.  Chest pain.  PORTABLE CHEST - 1 VIEW  Comparison: None.  Findings: Diffuse nonspecific interstitial prominence is present. Chronicity of unknown.  Exam is under penetrated. Cardiopericardial silhouette within normal limits.  No focal consolidation. Monitoring leads are projected over the chest.  IMPRESSION: No  acute cardiopulmonary disease.  Diffuse nonspecific interstitial prominence.   Original Report Authenticated By: Dereck Ligas, M.D.   Ecg:NSR. Minor nonspecific TWA  PHYSICAL EXAM General: Well developed, well nourished, in no acute distress. Head: Normocephalic, atraumatic, sclera non-icteric, no xanthomas, nares are without discharge. Neck: Negative for carotid bruits. JVD not elevated. Lungs: Clear bilaterally to auscultation without wheezes, rales, or rhonchi. Breathing is unlabored. Heart: RRR S1 S2 without murmurs, rubs, or  gallops.  Abdomen: Soft, non-tender, non-distended with normoactive bowel sounds. No hepatomegaly. No rebound/guarding. No obvious abdominal masses. Msk:  Strength and tone appears normal for age. Extremities: No clubbing, cyanosis or edema.  Distal pedal pulses are 2+ and equal bilaterally. No radial site hematoma. Neuro: Alert and oriented X 3. Moves all extremities spontaneously. Psych:  Responds to questions appropriately with a normal affect.  ASSESSMENT AND PLAN: 1. Unstable angina. S/p stenting of the LCx with DES. Residual moderate disease in the distal LAD. Dual antiplatelet therapy for one year. Will treat with Imdur and amlodipine. Not a candidate for beta blocker due to bradycardia. OK for discharge today.  2. PAD. On Pletal. Encourage walking program. 3. Tobacco abuse. Recommend smoking cessation. 4. HTN. Poorly controlled on lisinopril alone. Will add amlodipine.   Principal Problem:   Unstable angina Active Problems:   Hypertension   Tobacco abuse   Peripheral vascular disease    Weston Brass Peter Martinique MD,FACC 04/18/2013 8:19 AM

## 2013-04-18 NOTE — Discharge Summary (Signed)
Discharge Summary   Patient ID: Juan Wheeler MRN: [id number], DOB/AGE: 53/31/1962 53 y.o. Admit date: 04/17/2013 D/C date:     04/18/2013  Primary Cardiologist: Bronson Ing  Primary Discharge Diagnoses:  1. CAD with unstable angina this admission - abnl OP nuc -> LHC s/p DES to LCx, residual moderate disease in LAD (med rx unless refractory angina) 2. PAD, managed medically 3. Tobacco abuse 4. HTN 5. LV dysfunction EF 45% this admission  Secondary Discharge Diagnoses:  1. H/o DVT 2. Rectal cancer 3. Chronic pain, followed by [facility] pain management clinic 4. GERD   Hospital Course: Juan Wheeler is a 52 y/o M with history of HTN, DVT, rectal CA, PAD, chronic pain followed by pain management who presented to [facility] 04/17/2013 for planned cardiac cath following an abnormal stress test. He recently presented to [facility] in early July complaining of chest pain. This was on the left side of his chest occurring at rest and with exertion described as sharp with some radiation to his shoulder. It occasionally woke him up at night with associated SOB. He was admitted there and was ruled out for PE with negative d-dimer and MI. Per Dr. Alan Ripper consult note from that admission, he has history of peripheral arterial disease with occlusion of the right internal iliac artery, with significant atherosclerosis in the left internal iliac which was not amenable to reconstruction per notes from [facility] therapy has been recommended. He was discharged home and set up for an outpatient stress test that showed a fixed defect in the anterior wall that is relatively small, cannot rule out scar but no active ischemia in this distribution. There was a small area of ischemia in the mid to basal lateral wall. EF was 42% with global HK. Cardiac cath was recommended which demonstrated 2V CAD with culprit lesion in the prox LCx which was subsequently stented with DES. There was moderate obstructive  disease in LAD - the initial plan is for medical therapy, but if he has refractory angina despite 2 antianginal drugs could consider PCI of the distal LAD. EF was 45%. DAPT x 1 yr was recommended. He tolerated this procedure well. He was not felt to be a candidate for beta blocker due to bradycardia. Amlodipine was added (also to improve BP control). He was already on Imdur. Smoking cessation recommended. Walking program also recommended for his PAD (per verbal discussion with Dr. Martinique, will hold off Pletal due to need for ASA + Effient for now). Dr. Martinique has seen and examined the patient today and feels he is stable for discharge. The patient prefers to follow up with the [place] office in [place].   Since statin was initiated this admission, consider OP f/u labs.   Discharge Vitals: Blood pressure 169/93, pulse 84, temperature 98 F (36.7 C), temperature source Oral, resp. rate 16, height 5\' 9"  (1.753 m), weight 193 lb 2 oz (87.6 kg), SpO2 96.00%.  Labs: Lab Results  Component Value Date   WBC 7.0 04/18/2013   HGB 16.0 04/18/2013   HCT 45.7 04/18/2013   MCV 95.2 04/18/2013   PLT 180 04/18/2013     Recent Labs Lab 04/18/13 0545  NA 139  K 3.5  CL 104  CO2 24  BUN 11  CREATININE 1.15  CALCIUM 8.7  GLUCOSE 106*    Lab Results  Component Value Date   CHOL 161 03/29/2013   HDL 45 03/29/2013   LDLCALC 96 03/29/2013   TRIG 100 03/29/2013  Lab Results  Component Value Date   DDIMER <0.27 03/29/2013    Diagnostic Studies/Procedures   Cardiac catheterization this admission, please see full report and above for summary.   Nm Myocar Single W/spect W/wall Motion And Ef 04/05/2013   Ordering Physician: Dorris Carnes  Reading Physician: Satira Sark  Clinical Data: 52 year old male with history of hypertension, tobacco use, peripheral arterial disease, and recent chest pain symptoms.  He is referred now for the assessment of ischemia.  NUCLEAR MEDICINE STRESS MYOVIEW STUDY WITH SPECT AND  LEFT VENTRICULAR EJECTION FRACTION  Radionuclide Data: One-day rest/stress protocol performed with 10/30 mCi of Tc-41m Myoview.  Stress Data: The patient was exercised on a Bruce protocol for 1 minute and 16 seconds achieving a maximum workload of 4.6 mets. Heart rate increased from 69 beats per minute up to 117 beats per minute, which was only 70% of the maximum age predicted heart rate response.  Lexiscan was given to insure adequate stress response. Peak blood pressure was 178/86.  No chest pain was reported.  There were no diagnostic ST-segment abnormalities, no arrhythmias.  EKG: Baseline tracing shows sinus rhythm with poor R-wave progression, rule out old anteroseptal infarct pattern.  Scintigraphic Data: Analysis of the raw perfusion data finds adequate radiotracer uptake.  Tomographic views obtained using the short axis, vertical long axis, and horizontal long axis planes.  There is a small, mild intensity anterior defect that is fixed.  In addition to this there is a small, moderate intensity, mid to basal defect that is partially reversible.  Gated imaging reveals an EDV of 130, ESV of 76, T I D ratio 0.91, and LVEF of 42% with global hypokinesis.  IMPRESSION: Abnormal exercise/Lexiscan Myoview.  Low to intermediate risk study overall.  There were no clearly diagnostic ST-segment abnormalities.  Perfusion imaging shows a fixed defect in the anterior wall that is relatively small, cannot rule out scar but no active ischemia in this distribution.  There is a small area of ischemia in the mid to basal lateral wall. LV volumes are normal, LVEF calculated at 42% with global hypokinesis.   Original Report Authenticated By: Rozann Lesches   Dg Chest Portable 1 View 03/28/2013   *RADIOLOGY REPORT*  Clinical Data: Rectal cancer.  Colostomy.  Chest pain.  PORTABLE CHEST - 1 VIEW  Comparison: None.  Findings: Diffuse nonspecific interstitial prominence is present. Chronicity of unknown.  Exam is under penetrated.  Cardiopericardial silhouette within normal limits.  No focal consolidation. Monitoring leads are projected over the chest.  IMPRESSION: No acute cardiopulmonary disease.  Diffuse nonspecific interstitial prominence.   Original Report Authenticated By: Dereck Ligas, M.D.    Discharge Medications     Medication List    STOP taking these medications       cilostazol 100 MG tablet  Commonly known as:  PLETAL      TAKE these medications       amLODipine 5 MG tablet  Commonly known as:  NORVASC  Take 1 tablet (5 mg total) by mouth daily.     aspirin EC 81 MG tablet  Take 81 mg by mouth daily.     atorvastatin 80 MG tablet  Commonly known as:  LIPITOR  Take 1 tablet (80 mg total) by mouth every evening.     gabapentin 300 MG capsule  Commonly known as:  NEURONTIN  Take 900-1,200 mg by mouth 3 (three) times daily. Takes 900mg  in the morning, 1200mg  in the afternoon, and 1200mg  in the evening.  isosorbide mononitrate 15 mg Tb24  Commonly known as:  IMDUR  Take 0.5 tablets (15 mg total) by mouth daily.     lisinopril 40 MG tablet  Commonly known as:  PRINIVIL,ZESTRIL  Take 40 mg by mouth daily.     nitroGLYCERIN 0.4 MG SL tablet  Commonly known as:  NITROSTAT  Place 1 tablet (0.4 mg total) under the tongue every 5 (five) minutes as needed for chest pain (up to 3 doses).     omeprazole 40 MG capsule  Commonly known as:  PRILOSEC  Take 40 mg by mouth daily.     prasugrel 10 MG Tabs  Commonly known as:  EFFIENT  Take 1 tablet (10 mg total) by mouth daily.        Disposition   The patient will be discharged in stable condition to home. Discharge Orders   Future Appointments Provider Department Dept Phone   05/02/2013 2:20 PM Lendon Colonel, NP [facility] [place] [phone]   Future Orders Complete By Expires     Amb Referral to Cardiac Rehabilitation  As directed     Diet - low sodium heart healthy  As directed     Scheduling Instructions:       We recommend to stop drinking alcohol and stop smoking tobacco and marijuana.    Discharge instructions  As directed     Comments:      One of your heart tests showed weakness of the heart muscle this admission. This may make you more susceptible to weight gain from fluid retention, which can lead to symptoms that we call heart failure. For patients with this problem, we give them these special instructions:  1. Follow a low-salt diet and watch your fluid intake. In general, you should not be taking in more than 2 liters of fluid per day (no more than 8 glasses per day). Some patients are restricted to less than 1.5 liters of fluid per day (no more than 6 glasses per day). This includes sources of water in foods like soup, coffee, tea, milk, etc. 2. Weigh yourself on the same scale at same time of day and keep a log. 3. Call your doctor: (Anytime you feel any of the following symptoms)  - 3-4 pound weight gain in 1-2 days or 2 pounds overnight  - Shortness of breath, with or without a dry hacking cough  - Swelling in the hands, feet or stomach  - If you have to sleep on extra pillows at night in order to breathe   IT IS IMPORTANT TO LET YOUR DOCTOR KNOW EARLY ON IF YOU ARE HAVING SYMPTOMS SO WE CAN HELP YOU!    Increase activity slowly  As directed     Comments:      No driving for 2 days. No lifting over 5 lbs for 1 week. No sexual activity for 1 week. You may return to work in 1 week. Keep procedure site clean & dry. If you notice increased pain, swelling, bleeding or pus, call/return!  You may shower, but no soaking baths/hot tubs/pools for 1 week.  Please check your blood pressure periodically at home. If it tends to run over 130 on the top number or 80 on the bottom number, call your doctor.      Follow-up Information   Follow up with Jory Sims, NP. (05/02/13 at 2:20pm)    Contact information:   [address] [address] [place]  Duration of Discharge Encounter: Greater than 30 minutes including physician and PA time.  Signed, Melina Copa [name] 04/18/2013, 11:20 AM

## 2013-04-18 NOTE — Progress Notes (Signed)
CARDIAC REHAB PHASE I   PRE:  Rate/Rhythm: 70 SR  BP:  Supine: 166/101  Sitting:   Standing:    SaO2:   MODE:  Ambulation: 1000 ft   POST:  Rate/Rhythm: 88SR  BP:  Supine: 169/93  Sitting:   Standing:    SaO2:  0750-0850 Pt walked 1000 ft on RA independently with steady gait. Tolerated well. Denied CP. Slept well last night without CP that had been waking him up at night. Education completed with pt voicing understanding. Discussed CRP 2 and pt gave permission to refer to [facility].  Discussed not smoking and gave smoking cessation sheets. Encouraged pt to call 1800quitnow for coaching. Pt states he may need to use nicotine patches. Gave effient booklet. BP elevated before and after walk. No headache or blurred vision.   Graylon Good, RN BSN  04/18/2013 8:46 AM

## 2013-04-18 NOTE — Telephone Encounter (Signed)
Spoke to pharmacy rep was advised that their are no 15 mg tablet in stock. Ordered 30 mg to be cut in half.

## 2013-04-19 ENCOUNTER — Other Ambulatory Visit: Payer: Self-pay | Admitting: *Deleted

## 2013-04-20 MED ORDER — PRASUGREL HCL 10 MG PO TABS: 10.0000 mg | ORAL_TABLET | Freq: Every day | ORAL | Status: DC

## 2013-04-21 ENCOUNTER — Other Ambulatory Visit: Payer: Self-pay | Admitting: *Deleted

## 2013-05-02 ENCOUNTER — Ambulatory Visit (INDEPENDENT_AMBULATORY_CARE_PROVIDER_SITE_OTHER): Payer: Medicare Other | Admitting: Adult Health

## 2013-05-02 ENCOUNTER — Encounter: Payer: Self-pay | Admitting: Adult Health

## 2013-05-02 VITALS — BP 120/78 | HR 100 | Ht 69.0 in | Wt 187.0 lb

## 2013-05-02 DIAGNOSIS — I200 Unstable angina: Secondary | ICD-10-CM

## 2013-05-02 DIAGNOSIS — I10 Essential (primary) hypertension: Secondary | ICD-10-CM | POA: Diagnosis not present

## 2013-05-02 DIAGNOSIS — I2510 Atherosclerotic heart disease of native coronary artery without angina pectoris: Secondary | ICD-10-CM

## 2013-05-02 DIAGNOSIS — F17200 Nicotine dependence, unspecified, uncomplicated: Secondary | ICD-10-CM

## 2013-05-02 DIAGNOSIS — I7090 Unspecified atherosclerosis: Secondary | ICD-10-CM

## 2013-05-02 DIAGNOSIS — Z720 Tobacco use: Secondary | ICD-10-CM

## 2013-05-02 NOTE — Assessment & Plan Note (Signed)
Blood pressure is well controlled. The best I have seen it since he has been in the clinic.  He will continue on current medications without changes. He will be seen in 3 months.

## 2013-05-02 NOTE — Patient Instructions (Signed)
Your physician recommends that you schedule a follow-up appointment in: 3 MONTHS  Your physician recommends that you continue on your current medications as directed. Please refer to the Current Medication list given to you today.   

## 2013-05-02 NOTE — Progress Notes (Signed)
HPI: Juan Wheeler is a 52 year old patient of Dr. Pennelope Bracken, we are following for ongoing assessment and management of CAD and hypertension. Standard ongoing symptoms, and abnormal CBC, the patient was sent for cardiac catheterization.  This was completed and 04/04/2013 demonstrating 2 vessel obstructive CAD with culprit lesion in the proximal LCx. There is moderate obstructive disease in the distal LAD.. Mild LV dysfunction.. Successful stenting of the proximal LCx with a DES. He was started on dual antiplatelet therapy with aspirin and Effient, beta blocker therapy was not provided due to bradycardia. Amlodipine was started at low dose secondary to hypertension.   He comes today stating that he feels better than he has in many months. Denies chest pain, DOE and fatigue. He is medically compliant and is seeking drug assistance from [employer].   Allergies  Allergen Reactions  . Darvocet (Propoxyphene-Acetaminophen) Palpitations    Current Outpatient Prescriptions  Medication Sig Dispense Refill  . amLODipine (NORVASC) 5 MG tablet Take 1 tablet (5 mg total) by mouth daily.  30 tablet  6  . aspirin EC 81 MG tablet Take 81 mg by mouth daily.      Marland Kitchen atorvastatin (LIPITOR) 80 MG tablet Take 1 tablet (80 mg total) by mouth every evening.  30 tablet  6  . gabapentin (NEURONTIN) 300 MG capsule Take 900-1,200 mg by mouth 3 (three) times daily. Takes 900mg  in the morning, 1200mg  in the afternoon, and 1200mg  in the evening.      . isosorbide mononitrate (IMDUR) 30 MG 24 hr tablet Take 0.5 tablets (15 mg total) by mouth daily. Take 1/2 tab once daily to equal 15 mg  30 tablet  6  . nitroGLYCERIN (NITROSTAT) 0.4 MG SL tablet Place 1 tablet (0.4 mg total) under the tongue every 5 (five) minutes as needed for chest pain (up to 3 doses).  25 tablet  4  . omeprazole (PRILOSEC) 40 MG capsule Take 40 mg by mouth daily.      . prasugrel (EFFIENT) 10 MG TABS Take 1 tablet (10 mg total) by mouth daily.  30  tablet  6   No current facility-administered medications for this visit.    Past Medical History  Diagnosis Date  . Hypertension   . DVT (deep venous thrombosis) ~ 2013  . Rectal cancer   . Coronary artery disease     a. 03/2013: abnl nuc -> LHC s/p DES to LCx, residual moderate disease in LAD (med rx unless refractory angina). b. Not on BB due to bradycardia.  Marland Kitchen History of blood transfusion     "once; after throwing up alot of blood" (04/17/2013)  . GERD (gastroesophageal reflux disease)   . Chronic lower back pain     a. Follow[facility] management at [facility].  . PAD (peripheral artery disease)     a. Occlusion of the right internal iliac artery, with significant atherosclerosis in the left internal iliac which was not amenable to reconstruction per notes from [facility].  . LV dysfunction     a. EF 45% in 03/2013.  . Tobacco abuse     Past Surgical History  Procedure Laterality Date  . Colostomy    . Abdominal surgery  1990's    'for stomach ulcers" (04/17/2013)  . Coronary angioplasty with stent placement  04/17/2013    "?1" (04/17/2013)  . Inguinal hernia repair Bilateral 1990's  . Colostomy takedown  2013  . Colectomy  2012    "for rectal cancer" (04/17/2013)  [id number] of systems complete and found to be negative unless listed above  PHYSICAL EXAM BP 120/78  Pulse 100  Ht 5\' 9"  (1.753 m)  Wt 187 lb (84.823 kg)  BMI 27.6 kg/m2  SpO2 96% General: Well developed, well nourished, in no acute distress Head: Eyes PERRLA, No xanthomas.   Normal cephalic and atramatic  Lungs: Clear bilaterally to auscultation and percussion. Heart: HRRR S1 S2, without MRG.  Pulses are 2+ & equal.            No carotid bruit. No JVD.  Abdomen: Bowel sounds are positive, abdomen soft and non-tender without masses or                  Hernia's noted. Msk:  Back normal, normal gait. Normal strength and tone for age. Extremities: No clubbing, cyanosis or edema.  DP +1 Right wrist site  is well healed without bleeding or hematoma.  Neuro: Alert and oriented X 3. Psych:  Good affect, responds appropriately    ASSESSMENT AND PLAN

## 2013-05-02 NOTE — Assessment & Plan Note (Signed)
In remission. He is encouraged to continue cessation and continue healthy lifestyle changes.

## 2013-05-02 NOTE — Assessment & Plan Note (Signed)
He is doing well and without complaint. He is due to start cardiac rehab in 2 weeks. He is medically complaint. I have stressed the importance of taking Effient every day and not skipping doses. He is given a second RX for Effient for Free 30 day supply and is encouraged to fill out drug assistance form allowing him to have reduced cost.  He will continue current medications. Will not add BB at this time.

## 2013-05-02 NOTE — Progress Notes (Deleted)
Name: Juan Wheeler    DOB: 1961/01/17  Age: 52 y.o.  MR#: [id number]       PCP:  No primary provider on file.      Insurance: Payor: MEDICARE / Plan: MEDICARE PART A AND B / Product Type: *No Product type* /   CC:    Chief Complaint  Patient presents with  . Coronary Artery Disease  . Hypertension    VS Filed Vitals:   05/02/13 1432  BP: 120/78  Pulse: 100  Height: 5\' 9"  (1.753 m)  Weight: 187 lb (84.823 kg)  SpO2: 96%    Weights Current Weight  05/02/13 187 lb (84.823 kg)  04/18/13 193 lb 2 oz (87.6 kg)  04/18/13 193 lb 2 oz (87.6 kg)    Blood Pressure  BP Readings from Last 3 Encounters:  05/02/13 120/78  04/18/13 169/93  04/18/13 169/93     Admit date:  (Not on file) Last encounter with RMR:  04/14/2013   Allergy Darvocet  Current Outpatient Prescriptions  Medication Sig Dispense Refill  . amLODipine (NORVASC) 5 MG tablet Take 1 tablet (5 mg total) by mouth daily.  30 tablet  6  . aspirin EC 81 MG tablet Take 81 mg by mouth daily.      Marland Kitchen atorvastatin (LIPITOR) 80 MG tablet Take 1 tablet (80 mg total) by mouth every evening.  30 tablet  6  . gabapentin (NEURONTIN) 300 MG capsule Take 900-1,200 mg by mouth 3 (three) times daily. Takes 900mg  in the morning, 1200mg  in the afternoon, and 1200mg  in the evening.      . isosorbide mononitrate (IMDUR) 30 MG 24 hr tablet Take 0.5 tablets (15 mg total) by mouth daily. Take 1/2 tab once daily to equal 15 mg  30 tablet  6  . nitroGLYCERIN (NITROSTAT) 0.4 MG SL tablet Place 1 tablet (0.4 mg total) under the tongue every 5 (five) minutes as needed for chest pain (up to 3 doses).  25 tablet  4  . omeprazole (PRILOSEC) 40 MG capsule Take 40 mg by mouth daily.      . prasugrel (EFFIENT) 10 MG TABS Take 1 tablet (10 mg total) by mouth daily.  30 tablet  6   No current facility-administered medications for this visit.    Discontinued Meds:   There are no discontinued medications.  Patient Active Problem List   Diagnosis Date  Noted  . Unstable angina 04/18/2013  . Chest pain 03/29/2013  . Overweight 03/29/2013  . Tobacco abuse 03/29/2013  . Hypertension   . Chronic pain syndrome 11/09/2012  . Pain in joint, pelvic region and thigh 11/09/2012  . Neuralgia and neuritis 10/12/2012  . Atherosclerosis of native arteries of the extremities with intermittent claudication 08/19/2012  . Backache 03/24/2012  . Peripheral vascular disease 12/04/2011  . Hyposmolality and/or hyponatremia 12/04/2011  . Acute posthemorrhagic anemia 12/04/2011  . Compression of vein 12/04/2011  . Heartburn 12/03/2011  . Nausea with vomiting 12/03/2011  . Personal history of digestive disease 11/19/2011  . Chronic pain 11/12/2011  . Depressive disorder 09/24/2011  . Dysuria 08/31/2011  . Impotence of organic origin 08/31/2011  . Urinary frequency 08/31/2011  . Hematoma complicating a procedure 06/25/2011  . Constipation 06/05/2011  . Essential hypertension 06/05/2011  . Anal or rectal pain 06/05/2011  . Malignant neoplasm of rectum 12/02/2010    LABS    Component Value Date/Time   NA 139 04/18/2013 0545   NA 143 04/14/2013 1617   NA 139 03/29/2013 0528  K 3.5 04/18/2013 0545   K 4.3 04/14/2013 1617   K 4.0 03/29/2013 0528   CL 104 04/18/2013 0545   CL 108 04/14/2013 1617   CL 105 03/29/2013 0528   CO2 24 04/18/2013 0545   CO2 28 04/14/2013 1617   CO2 25 03/29/2013 0528   GLUCOSE 106* 04/18/2013 0545   GLUCOSE 104* 04/14/2013 1617   GLUCOSE 108* 03/29/2013 0528   BUN 11 04/18/2013 0545   BUN 14 04/14/2013 1617   BUN 11 03/29/2013 0528   CREATININE 1.15 04/18/2013 0545   CREATININE 1.10 04/14/2013 1617   CREATININE 1.07 03/29/2013 0528   CREATININE 1.20 03/28/2013 2324   CALCIUM 8.7 04/18/2013 0545   CALCIUM 9.4 04/14/2013 1617   CALCIUM 8.8 03/29/2013 0528   GFRNONAA 71* 04/18/2013 0545   GFRNONAA 77* 03/29/2013 0528   GFRNONAA 67* 03/28/2013 2324   GFRAA 82* 04/18/2013 0545   GFRAA 90* 03/29/2013 0528   GFRAA 78* 03/28/2013 2324   CMP      Component Value Date/Time   NA 139 04/18/2013 0545   K 3.5 04/18/2013 0545   CL 104 04/18/2013 0545   CO2 24 04/18/2013 0545   GLUCOSE 106* 04/18/2013 0545   BUN 11 04/18/2013 0545   CREATININE 1.15 04/18/2013 0545   CREATININE 1.10 04/14/2013 1617   CALCIUM 8.7 04/18/2013 0545   PROT 7.0 03/29/2013 0528   ALBUMIN 3.1* 03/29/2013 0528   AST 14 03/29/2013 0528   ALT 7 03/29/2013 0528   ALKPHOS 72 03/29/2013 0528   BILITOT 0.5 03/29/2013 0528   GFRNONAA 71* 04/18/2013 0545   GFRAA 82* 04/18/2013 0545       Component Value Date/Time   WBC 7.0 04/18/2013 0545   WBC 5.9 04/14/2013 1617   WBC 6.6 03/29/2013 0528   HGB 16.0 04/18/2013 0545   HGB 16.9 04/14/2013 1617   HGB 16.2 03/29/2013 0528   HCT 45.7 04/18/2013 0545   HCT 47.2 04/14/2013 1617   HCT 46.7 03/29/2013 0528   MCV 95.2 04/18/2013 0545   MCV 93.1 04/14/2013 1617   MCV 95.9 03/29/2013 0528    Lipid Panel     Component Value Date/Time   CHOL 161 03/29/2013 0517   TRIG 100 03/29/2013 0517   HDL 45 03/29/2013 0517   CHOLHDL 3.6 03/29/2013 0517   VLDL 20 03/29/2013 0517   LDLCALC 96 03/29/2013 0517    ABG No results found for this basename: phart, pco2, pco2art, po2, po2art, hco3, tco2, acidbasedef, o2sat     Lab Results  Component Value Date   TSH 1.083 03/29/2013   BNP (last 3 results) No results found for this basename: PROBNP,  in the last 8760 hours Cardiac Panel (last 3 results) No results found for this basename: CKTOTAL, CKMB, TROPONINI, RELINDX,  in the last 72 hours  Iron/TIBC/Ferritin No results found for this basename: iron, tibc, ferritin     EKG Orders placed during the hospital encounter of 04/17/13  . EKG 12-LEAD  . EKG 12-LEAD  . EKG 12-LEAD  . EKG 12-LEAD  . EKG 12-LEAD  . EKG 12-LEAD  . EKG  . EKG 12-LEAD  . EKG 12-LEAD     Prior Assessment and Plan Problem List as of 05/02/2013     Cardiovascular and Mediastinum   Hypertension   Last Assessment & Plan   04/14/2013 Office Visit Written 04/14/2013  7:04 PM by Lendon Colonel, NP     He currently is well-controlled with a blood pressure 120/68 on  this evaluation. He will continue on lisinopril 40 mg daily. I have given him a low dose of isosorbide 15mg  daily for for chest pain.. This certainly can be discontinued if he has no CAD, or has hypotensive response.    Atherosclerosis of native arteries of the extremities with intermittent claudication   Last Assessment & Plan   04/14/2013 Office Visit Written 04/14/2013  7:05 PM by Lendon Colonel, NP     He continues on Pletal, and has had some improvement in symptoms.    Peripheral vascular disease   Essential hypertension   Compression of vein   Unstable angina     Digestive   Constipation   Personal history of digestive disease   Malignant neoplasm of rectum   Nausea with vomiting     Nervous and Auditory   Neuralgia and neuritis     Genitourinary   Impotence of organic origin     Other   Chest pain   Last Assessment & Plan   04/14/2013 Office Visit Written 04/14/2013  7:03 PM by Lendon Colonel, NP     The patient had an abnormal stress Myoview demonstrating a fixed defect in anterior wall that is relatively small, cannot rule out scar but no active ischemia in that distribution. However there was a small area of ischemia in the mid to basal lateral wall. LVEF is 42% with global hypokinesis. The patient has ongoing chest discomfort which he noticed mostly at nighttime. With history of peripheral arterial disease, and hypertension. The patient will likely benefit from diagnostic cardiac catheterization for definitive evaluation of CAD. I have discussed this with Dr. Pennelope Bracken on-site, who has reviewed the chart and is in agreement that the patient would benefit from cath. I have discussed risks and benefits to include bleeding IV dye reaction and death. The patient verbalizes understanding and is willing to proceed. He has been provided the literature concerning cardiac catheterization in written  form for him to review prior to the catheterization as well. Precath orders are written he is scheduled for 04/17/2013.    Overweight   Tobacco abuse   Last Assessment & Plan   04/14/2013 Office Visit Written 04/14/2013  7:04 PM by Lendon Colonel, NP     The patient has been counseled on smoking cessation.    Backache   Chronic pain syndrome   Chronic pain   Depressive disorder   Dysuria   Heartburn   Hematoma complicating a procedure   Pain in joint, pelvic region and thigh   Hyposmolality and/or hyponatremia   Acute posthemorrhagic anemia   Anal or rectal pain   Urinary frequency       Imaging: Nm Myocar Single W/spect W/wall Motion And Ef  04/05/2013   Ordering Physician: Dorris Carnes  Reading Physician: Satira Sark  Clinical Data: 52 year old male with history of hypertension, tobacco use, peripheral arterial disease, and recent chest pain symptoms.  He is referred now for the assessment of ischemia.  NUCLEAR MEDICINE STRESS MYOVIEW STUDY WITH SPECT AND LEFT VENTRICULAR EJECTION FRACTION  Radionuclide Data: One-day rest/stress protocol performed with 10/30 mCi of Tc-60m Myoview.  Stress Data: The patient was exercised on a Bruce protocol for 1 minute and 16 seconds achieving a maximum workload of 4.6 mets. Heart rate increased from 69 beats per minute up to 117 beats per minute, which was only 70% of the maximum age predicted heart rate response.  Lexiscan was given to insure adequate stress response. Peak blood pressure was 178/86.  No chest pain was reported.  There were no diagnostic ST-segment abnormalities, no arrhythmias.  EKG: Baseline tracing shows sinus rhythm with poor R-wave progression, rule out old anteroseptal infarct pattern.  Scintigraphic Data: Analysis of the raw perfusion data finds adequate radiotracer uptake.  Tomographic views obtained using the short axis, vertical long axis, and horizontal long axis planes.  There is a small, mild intensity anterior defect  that is fixed.  In addition to this there is a small, moderate intensity, mid to basal defect that is partially reversible.  Gated imaging reveals an EDV of 130, ESV of 76, T I D ratio 0.91, and LVEF of 42% with global hypokinesis.  IMPRESSION: Abnormal exercise/Lexiscan Myoview.  Low to intermediate risk study overall.  There were no clearly diagnostic ST-segment abnormalities.  Perfusion imaging shows a fixed defect in the anterior wall that is relatively small, cannot rule out scar but no active ischemia in this distribution.  There is a small area of ischemia in the mid to basal lateral wall. LV volumes are normal, LVEF calculated at 42% with global hypokinesis.   Original Report Authenticated By: Rozann Lesches

## 2013-05-16 ENCOUNTER — Other Ambulatory Visit: Payer: Self-pay | Admitting: Physician Assistant

## 2013-05-17 ENCOUNTER — Other Ambulatory Visit: Payer: Self-pay | Admitting: *Deleted

## 2013-05-17 MED ORDER — PRASUGREL HCL 10 MG PO TABS: 10.0000 mg | ORAL_TABLET | Freq: Every day | ORAL | Status: DC

## 2013-05-17 NOTE — Telephone Encounter (Signed)
Incoming fax of patient medication refill request noted per patient pharmacy. effient sent via escribe

## 2013-05-19 ENCOUNTER — Encounter (HOSPITAL_COMMUNITY)
Admission: RE | Admit: 2013-05-19 | Discharge: 2013-05-19 | Disposition: A | Discharge status: Home / Self Care | Payer: Medicare Other | Source: Ambulatory Visit | Attending: Cardiovascular Disease | Admitting: Cardiovascular Disease

## 2013-05-19 VITALS — BP 120/80 | HR 95 | Ht 69.0 in | Wt 188.0 lb

## 2013-05-19 DIAGNOSIS — Z9861 Coronary angioplasty status: Secondary | ICD-10-CM

## 2013-05-19 DIAGNOSIS — I10 Essential (primary) hypertension: Secondary | ICD-10-CM

## 2013-05-19 NOTE — Patient Instructions (Signed)
Pt has finished orientation and is scheduled to start CR on 06/05/13 at 8:15am. Pt has been instructed to arrive to class 15 minutes early for scheduled class. Pt has been instructed to wear comfortable clothing and shoes with rubber soles. Pt has been told to take their medications 1 hour prior to coming to class.  If the patient is not going to attend class, he/she has been instructed to call.

## 2013-05-19 NOTE — Progress Notes (Signed)
Juan Wheeler 52 y.o. male  Initial Psychosocial Assessment  Pt psychosocial assessment reveals pt lives with their spouse. Pt is currently unemployed, disabled. Pt hobbies include basketball, softball, playingpool. Pt reports his stress his stress level is low.  Areas of stress/anxiety include Health.  Pt does not exhibit signs of depression.Signs of depression include hopelessness and difficulty falling asleep. Pt shows good  coping skills with positive outlook . Patient's family Offered emotional support and reassurance. Monitor and evaluate progress toward psychosocial goal(s).  Goal(s): Staff will discuss ways of  Improved management of patient's stress.  Staff will discuss ways of how to Improve coping skills Staff will discuss ways to Help patient work toward returning to meaningful activities that improve patient's QOL and are attainable with patient's heart disease   05/19/2013 9:11 AM

## 2013-05-19 NOTE — Progress Notes (Signed)
Patient referred to CR by Dr. Peter Martinique due to Stent Placement V45.82. During orientation advised patient on arrival and appointment times what to wear, what to do before, during and after exercise. Reviewed attendance and class policy. Talked about inclement weather and class consultation policy. Pt is scheduled to start Cardiac Rehab on 06/05/13 at 8:15. Pt was advised to come to class 5 minutes before class starts. He was also given instructions on meeting with the dietician and attending the Family Structure classes. Pt is eager to get started. Will do 6 minute walk test.

## 2013-06-05 ENCOUNTER — Encounter (HOSPITAL_COMMUNITY): Payer: Medicare Other

## 2013-06-07 ENCOUNTER — Encounter (HOSPITAL_COMMUNITY): Payer: Medicare Other

## 2013-06-07 ENCOUNTER — Telehealth: Payer: Self-pay | Admitting: *Deleted

## 2013-06-07 NOTE — Telephone Encounter (Signed)
Received request for prior authorization for pt to have Effient filled this nurse contacted PA rep Morey Hummingbird with [facility] at [phone]  and was given case# [id number], she advised the case will be pending to have filled for the pt and a phone call will be generated to our office, pt ID# [id number]

## 2013-06-09 ENCOUNTER — Encounter (HOSPITAL_COMMUNITY): Payer: Medicare Other

## 2013-06-12 ENCOUNTER — Encounter (HOSPITAL_COMMUNITY): Payer: Medicare Other

## 2013-06-13 ENCOUNTER — Telehealth: Payer: Self-pay | Admitting: *Deleted

## 2013-06-13 MED ORDER — NITROGLYCERIN 0.4 MG SL SUBL: 0.4000 mg | SUBLINGUAL_TABLET | SUBLINGUAL | Status: DC | PRN

## 2013-06-13 NOTE — Telephone Encounter (Signed)
Medication sent via escribe.  

## 2013-06-13 NOTE — Telephone Encounter (Signed)
PT NEEDS NITRO CALLED IN TO [facility] [address], HE LOST HIS

## 2013-06-14 ENCOUNTER — Encounter (HOSPITAL_COMMUNITY)
Admission: RE | Admit: 2013-06-14 | Discharge: 2013-06-14 | Disposition: A | Discharge status: Home / Self Care | Payer: Medicare Other | Source: Ambulatory Visit | Attending: Cardiology | Admitting: Cardiology

## 2013-06-14 DIAGNOSIS — I2510 Atherosclerotic heart disease of native coronary artery without angina pectoris: Secondary | ICD-10-CM | POA: Insufficient documentation

## 2013-06-14 DIAGNOSIS — Z5189 Encounter for other specified aftercare: Secondary | ICD-10-CM | POA: Diagnosis not present

## 2013-06-14 DIAGNOSIS — Z9861 Coronary angioplasty status: Secondary | ICD-10-CM | POA: Diagnosis not present

## 2013-06-15 DIAGNOSIS — C20 Malignant neoplasm of rectum: Secondary | ICD-10-CM | POA: Diagnosis not present

## 2013-06-16 ENCOUNTER — Encounter (HOSPITAL_COMMUNITY)
Admission: RE | Admit: 2013-06-16 | Discharge: 2013-06-16 | Disposition: A | Discharge status: Home / Self Care | Payer: Medicare Other | Source: Ambulatory Visit | Attending: Cardiology | Admitting: Cardiology

## 2013-06-16 DIAGNOSIS — Z9861 Coronary angioplasty status: Secondary | ICD-10-CM | POA: Diagnosis not present

## 2013-06-16 DIAGNOSIS — I2510 Atherosclerotic heart disease of native coronary artery without angina pectoris: Secondary | ICD-10-CM | POA: Diagnosis not present

## 2013-06-16 DIAGNOSIS — Z5189 Encounter for other specified aftercare: Secondary | ICD-10-CM | POA: Diagnosis not present

## 2013-06-19 ENCOUNTER — Encounter (HOSPITAL_COMMUNITY): Payer: Medicare Other

## 2013-06-19 ENCOUNTER — Telehealth: Payer: Self-pay | Admitting: *Deleted

## 2013-06-19 MED ORDER — PRASUGREL HCL 10 MG PO TABS: 10.0000 mg | ORAL_TABLET | Freq: Every day | ORAL | Status: DC

## 2013-06-19 NOTE — Telephone Encounter (Signed)
Noted incoming call from [place] with pt dental office to advise the pt is in the dental chair at this moment and needs to know if he needs any adjustments to his medications per dental procedure, Dr Harl Bowie gave verbal instructions to advise the pt had a stent 3 months ago and CAN NOT HOLD HIS EFFIENT OR ASPRIN until he is 6 months post stent, was advised pt will only need a cleaning, Dr Harl Bowie gave verbal instructions the cleaning is permitted without prior medication changes/ABT, susan understood and will also advise the pt to contact the cardiac office prior to his dental visit to ensure the correct protocol is met

## 2013-06-19 NOTE — Telephone Encounter (Signed)
LATE ENTRY pt Rx was confirmed approved and sent via escribe a week ago

## 2013-06-21 ENCOUNTER — Encounter (HOSPITAL_COMMUNITY)
Admission: RE | Admit: 2013-06-21 | Discharge: 2013-06-21 | Disposition: A | Discharge status: Home / Self Care | Payer: Medicare Other | Source: Ambulatory Visit | Attending: Cardiology | Admitting: Cardiology

## 2013-06-21 DIAGNOSIS — Z5189 Encounter for other specified aftercare: Secondary | ICD-10-CM | POA: Diagnosis not present

## 2013-06-21 DIAGNOSIS — Z9861 Coronary angioplasty status: Secondary | ICD-10-CM | POA: Diagnosis not present

## 2013-06-21 DIAGNOSIS — I2510 Atherosclerotic heart disease of native coronary artery without angina pectoris: Secondary | ICD-10-CM | POA: Diagnosis not present

## 2013-06-23 ENCOUNTER — Encounter (HOSPITAL_COMMUNITY)
Admission: RE | Admit: 2013-06-23 | Discharge: 2013-06-23 | Disposition: A | Discharge status: Home / Self Care | Payer: Medicare Other | Source: Ambulatory Visit | Attending: Cardiology | Admitting: Cardiology

## 2013-06-23 DIAGNOSIS — Z5189 Encounter for other specified aftercare: Secondary | ICD-10-CM | POA: Diagnosis not present

## 2013-06-23 DIAGNOSIS — Z9861 Coronary angioplasty status: Secondary | ICD-10-CM | POA: Diagnosis not present

## 2013-06-23 DIAGNOSIS — I2510 Atherosclerotic heart disease of native coronary artery without angina pectoris: Secondary | ICD-10-CM | POA: Diagnosis not present

## 2013-06-26 ENCOUNTER — Encounter (HOSPITAL_COMMUNITY)
Admission: RE | Admit: 2013-06-26 | Discharge: 2013-06-26 | Disposition: A | Discharge status: Home / Self Care | Payer: Medicare Other | Source: Ambulatory Visit | Attending: Cardiology | Admitting: Cardiology

## 2013-06-26 DIAGNOSIS — Z5189 Encounter for other specified aftercare: Secondary | ICD-10-CM | POA: Diagnosis not present

## 2013-06-26 DIAGNOSIS — Z9861 Coronary angioplasty status: Secondary | ICD-10-CM | POA: Diagnosis not present

## 2013-06-26 DIAGNOSIS — I2510 Atherosclerotic heart disease of native coronary artery without angina pectoris: Secondary | ICD-10-CM | POA: Diagnosis not present

## 2013-06-28 ENCOUNTER — Encounter (HOSPITAL_COMMUNITY): Payer: Medicare Other

## 2013-06-30 ENCOUNTER — Encounter (HOSPITAL_COMMUNITY)
Admission: RE | Admit: 2013-06-30 | Discharge: 2013-06-30 | Disposition: A | Discharge status: Home / Self Care | Payer: Medicare Other | Source: Ambulatory Visit | Attending: Cardiology | Admitting: Cardiology

## 2013-06-30 DIAGNOSIS — R079 Chest pain, unspecified: Secondary | ICD-10-CM | POA: Insufficient documentation

## 2013-07-03 ENCOUNTER — Encounter (HOSPITAL_COMMUNITY)
Admission: RE | Admit: 2013-07-03 | Discharge: 2013-07-03 | Disposition: A | Discharge status: Home / Self Care | Payer: Medicare Other | Source: Ambulatory Visit | Attending: Cardiology | Admitting: Cardiology

## 2013-07-03 DIAGNOSIS — R079 Chest pain, unspecified: Secondary | ICD-10-CM | POA: Diagnosis not present

## 2013-07-04 DIAGNOSIS — J438 Other emphysema: Secondary | ICD-10-CM | POA: Diagnosis not present

## 2013-07-04 DIAGNOSIS — C20 Malignant neoplasm of rectum: Secondary | ICD-10-CM | POA: Diagnosis not present

## 2013-07-04 DIAGNOSIS — J984 Other disorders of lung: Secondary | ICD-10-CM | POA: Diagnosis not present

## 2013-07-04 DIAGNOSIS — R918 Other nonspecific abnormal finding of lung field: Secondary | ICD-10-CM | POA: Diagnosis not present

## 2013-07-05 ENCOUNTER — Encounter (HOSPITAL_COMMUNITY): Payer: Medicare Other

## 2013-07-07 ENCOUNTER — Encounter (HOSPITAL_COMMUNITY): Payer: Medicare Other

## 2013-07-10 ENCOUNTER — Encounter (HOSPITAL_COMMUNITY): Payer: Medicare Other

## 2013-07-12 ENCOUNTER — Encounter (HOSPITAL_COMMUNITY): Payer: Medicare Other

## 2013-07-14 ENCOUNTER — Encounter (HOSPITAL_COMMUNITY): Payer: Medicare Other

## 2013-07-17 ENCOUNTER — Encounter (HOSPITAL_COMMUNITY): Payer: Medicare Other

## 2013-07-19 ENCOUNTER — Encounter (HOSPITAL_COMMUNITY): Payer: Medicare Other

## 2013-07-21 ENCOUNTER — Encounter (HOSPITAL_COMMUNITY): Payer: Medicare Other

## 2013-07-24 ENCOUNTER — Encounter (HOSPITAL_COMMUNITY): Payer: Medicare Other

## 2013-07-25 ENCOUNTER — Emergency Department (HOSPITAL_COMMUNITY)
Admission: EM | Admit: 2013-07-25 | Discharge: 2013-07-25 | Disposition: A | Discharge status: Home / Self Care | Payer: Medicare Other | Attending: Emergency Medicine | Admitting: Emergency Medicine

## 2013-07-25 ENCOUNTER — Encounter (HOSPITAL_COMMUNITY): Payer: Self-pay | Admitting: Emergency Medicine

## 2013-07-25 DIAGNOSIS — H9222 Otorrhagia, left ear: Secondary | ICD-10-CM

## 2013-07-25 DIAGNOSIS — Z7902 Long term (current) use of antithrombotics/antiplatelets: Secondary | ICD-10-CM | POA: Insufficient documentation

## 2013-07-25 DIAGNOSIS — Z7982 Long term (current) use of aspirin: Secondary | ICD-10-CM | POA: Diagnosis not present

## 2013-07-25 DIAGNOSIS — Z79899 Other long term (current) drug therapy: Secondary | ICD-10-CM | POA: Insufficient documentation

## 2013-07-25 DIAGNOSIS — Z9861 Coronary angioplasty status: Secondary | ICD-10-CM | POA: Insufficient documentation

## 2013-07-25 DIAGNOSIS — Z85048 Personal history of other malignant neoplasm of rectum, rectosigmoid junction, and anus: Secondary | ICD-10-CM | POA: Insufficient documentation

## 2013-07-25 DIAGNOSIS — K219 Gastro-esophageal reflux disease without esophagitis: Secondary | ICD-10-CM | POA: Insufficient documentation

## 2013-07-25 DIAGNOSIS — H9210 Otorrhea, unspecified ear: Secondary | ICD-10-CM | POA: Insufficient documentation

## 2013-07-25 DIAGNOSIS — Z86718 Personal history of other venous thrombosis and embolism: Secondary | ICD-10-CM | POA: Diagnosis not present

## 2013-07-25 DIAGNOSIS — I2510 Atherosclerotic heart disease of native coronary artery without angina pectoris: Secondary | ICD-10-CM | POA: Diagnosis not present

## 2013-07-25 DIAGNOSIS — F17200 Nicotine dependence, unspecified, uncomplicated: Secondary | ICD-10-CM | POA: Insufficient documentation

## 2013-07-25 DIAGNOSIS — G8929 Other chronic pain: Secondary | ICD-10-CM | POA: Insufficient documentation

## 2013-07-25 DIAGNOSIS — I739 Peripheral vascular disease, unspecified: Secondary | ICD-10-CM | POA: Insufficient documentation

## 2013-07-25 DIAGNOSIS — I10 Essential (primary) hypertension: Secondary | ICD-10-CM | POA: Diagnosis not present

## 2013-07-25 DIAGNOSIS — H6120 Impacted cerumen, unspecified ear: Secondary | ICD-10-CM | POA: Diagnosis not present

## 2013-07-25 MED ORDER — NEOMYCIN-POLYMYXIN-HC 1 % OT SOLN
4.0000 [drp] | Freq: Once | OTIC | Status: AC
  Administered 2013-07-25: 4 [drp] via OTIC
  Filled 2013-07-25: qty 10

## 2013-07-25 NOTE — ED Notes (Signed)
Scratched left ear 30 min pta and states started bleeding and has not stopped. Dark red blood coming from left ear. Has not taken bp meds this am. Denies any head injuries. Nad.

## 2013-07-25 NOTE — ED Notes (Signed)
Pt has oozing of blood from lt ear canal. No known injury.

## 2013-07-25 NOTE — ED Notes (Signed)
Cont to have sl oozing , cleaned area with Qtip and will recheck

## 2013-07-26 ENCOUNTER — Encounter (HOSPITAL_COMMUNITY): Payer: Medicare Other

## 2013-07-27 ENCOUNTER — Ambulatory Visit (INDEPENDENT_AMBULATORY_CARE_PROVIDER_SITE_OTHER): Payer: Medicare Other | Admitting: Otolaryngology

## 2013-07-27 DIAGNOSIS — H66019 Acute suppurative otitis media with spontaneous rupture of ear drum, unspecified ear: Secondary | ICD-10-CM | POA: Diagnosis not present

## 2013-07-27 DIAGNOSIS — H6120 Impacted cerumen, unspecified ear: Secondary | ICD-10-CM

## 2013-07-27 NOTE — ED Provider Notes (Signed)
CSN: [id number]     Arrival date & time 07/25/13  1018 History   First MD Initiated Contact with Patient 07/25/13 1039     Chief Complaint  Patient presents with  . Otalgia   (Consider location/radiation/quality/duration/timing/severity/associated sxs/prior Treatment) HPI Comments: Juan Wheeler is a 52 y.o. Male presenting with bleeding from his left ear.  He reports having increased decreased hearing acuity and thought he had a cerumen blockage in the left ear,  Trying to clear this with his finger about 30 minutes before arrival,  Causing bleeding from the ear which has been persistent.  He denies pain and denies worsened hearing acuity, headache,  Tinnitus or dizziness.  He has applied pressure without relief.  He is on Effient due to history of dvt.     The history is provided by the patient.    Past Medical History  Diagnosis Date  . Hypertension   . DVT (deep venous thrombosis) ~ 2013  . Rectal cancer   . Coronary artery disease     a. 03/2013: abnl nuc -> LHC s/p DES to LCx, residual moderate disease in LAD (med rx unless refractory angina). b. Not on BB due to bradycardia.  Marland Kitchen History of blood transfusion     "once; after throwing up alot of blood" (04/17/2013)  . GERD (gastroesophageal reflux disease)   . Chronic lower back pain     a. Followed by pain management at [facility].  . PAD (peripheral artery disease)     a. Occlusion of the right internal iliac artery, with significant atherosclerosis in the left internal iliac which was not amenable to reconstruction per notes from [facility].  . LV dysfunction     a. EF 45% in 03/2013.  . Tobacco abuse    Past Surgical History  Procedure Laterality Date  . Colostomy    . Abdominal surgery  1990's    'for stomach ulcers" (04/17/2013)  . Coronary angioplasty with stent placement  04/17/2013    "?1" (04/17/2013)  . Inguinal hernia repair Bilateral 1990's  . Colostomy takedown  2013  . Colectomy  2012    "for rectal cancer"  (04/17/2013)   History reviewed. No pertinent family history. History  Substance Use Topics  . Smoking status: Current Every Day Smoker -- 1.00 packs/day for 40 years    Types: Cigarettes  . Smokeless tobacco: Current User  . Alcohol Use: 4.8 oz/week    6 Cans of beer, 2 Shots of liquor per week     Comment: 04/17/2013 "bout a 6 pack/wk"    Review of Systems  Constitutional: Negative for fever.  HENT: Positive for ear discharge. Negative for congestion, ear pain, rhinorrhea and sore throat.   Eyes: Negative.   Respiratory: Negative for shortness of breath.   Cardiovascular: Negative for chest pain.  Gastrointestinal: Negative for nausea.  Skin: Negative.  Negative for rash and wound.  Neurological: Negative for dizziness, weakness, light-headedness, numbness and headaches.  Psychiatric/Behavioral: Negative.     Allergies  Darvocet  Home Medications   Current Outpatient Rx  Name  Route  Sig  Dispense  Refill  . amLODipine (NORVASC) 5 MG tablet   Oral   Take 1 tablet (5 mg total) by mouth daily.   30 tablet   6   . aspirin EC 81 MG tablet   Oral   Take 81 mg by mouth daily.         Marland Kitchen atorvastatin (LIPITOR) 80 MG tablet   Oral  Take 1 tablet (80 mg total) by mouth every evening.   30 tablet   6   . gabapentin (NEURONTIN) 300 MG capsule   Oral   Take 900-1,200 mg by mouth 3 (three) times daily. Takes 900mg  in the morning, 1200mg  in the afternoon, and 1200mg  in the evening.         . isosorbide mononitrate (IMDUR) 30 MG 24 hr tablet   Oral   Take 0.5 tablets (15 mg total) by mouth daily. Take 1/2 tab once daily to equal 15 mg   30 tablet   6   . lisinopril (PRINIVIL,ZESTRIL) 40 MG tablet   Oral   Take 40 mg by mouth daily.         . nitroGLYCERIN (NITROSTAT) 0.4 MG SL tablet   Sublingual   Place 1 tablet (0.4 mg total) under the tongue every 5 (five) minutes as needed for chest pain (up to 3 doses).   25 tablet   3   . omeprazole (PRILOSEC) 40 MG  capsule   Oral   Take 40 mg by mouth daily.         . prasugrel (EFFIENT) 10 MG TABS tablet   Oral   Take 1 tablet (10 mg total) by mouth daily.   30 tablet   6    BP 140/91  Pulse 90  Temp(Src) 98.1 F (36.7 C) (Oral)  Resp 19  SpO2 95% Physical Exam  Constitutional: He is oriented to person, place, and time. He appears well-developed and well-nourished.  HENT:  Head: Normocephalic and atraumatic.  Right Ear: Tympanic membrane and ear canal normal.  Nose: Mucosal edema and rhinorrhea present.  Mouth/Throat: Uvula is midline, oropharynx is clear and moist and mucous membranes are normal. No oropharyngeal exudate, posterior oropharyngeal edema, posterior oropharyngeal erythema or tonsillar abscesses.  Small but steady bleeding from left ear canal.  There is also thick bloody foreign body consistent with blood soaked cerumen. Unable to visualize source of blood, also unable to visualize TM,  Even after copious bloody cerumen removal with curette.  Eyes: Conjunctivae are normal.  Cardiovascular: Normal rate and normal heart sounds.   Pulmonary/Chest: Effort normal. No respiratory distress. He has no wheezes. He has no rales.  Abdominal: Soft. There is no tenderness.  Musculoskeletal: Normal range of motion.  Neurological: He is alert and oriented to person, place, and time.  Skin: Skin is warm and dry. No rash noted.  Psychiatric: He has a normal mood and affect.    ED Course  EAR CERUMEN REMOVAL Date/Time: 07/25/2013 11:45 AM Performed by: Evalee Jefferson Authorized by: Evalee Jefferson Consent: Verbal consent obtained. Risks and benefits: risks, benefits and alternatives were discussed Consent given by: patient Time out: Immediately prior to procedure a "time out" was called to verify the correct patient, procedure, equipment, support staff and site/side marked as required. Local anesthetic: none Location details: left ear Procedure type: curette Comments: Moderate amount of  cerumen removed,  Bloody. Unable to visualize tm or source of bleeding.   (including critical care time) Labs Review Labs Reviewed - No data to display Imaging Review No results found.  EKG Interpretation   None       MDM   1. Bleeding from ear, left    Spoke with Dr. Benjamine Mola with ENT.  He will see for recheck in his office in 2 days.  In interim,  Pt given cortisporin otic drops,  Advised tid placement, cotton ball for tamponade.    The patient  appears reasonably screened and/or stabilized for discharge and I doubt any other medical condition or other [facility] requiring further screening, evaluation, or treatment in the ED at this time prior to discharge.     Evalee Jefferson, [name] 07/27/13 1441

## 2013-07-28 ENCOUNTER — Encounter (HOSPITAL_COMMUNITY): Payer: Medicare Other

## 2013-07-28 NOTE — ED Provider Notes (Signed)
Medical screening examination/treatment/procedure(s) were performed by non-physician practitioner and as supervising physician I was immediately available for consultation/collaboration.  EKG Interpretation   None         Mervin Kung, MD 07/28/13 [phone]

## 2013-07-31 ENCOUNTER — Encounter (HOSPITAL_COMMUNITY): Payer: Medicare Other

## 2013-08-02 ENCOUNTER — Encounter (HOSPITAL_COMMUNITY): Payer: Medicare Other

## 2013-08-04 ENCOUNTER — Encounter (HOSPITAL_COMMUNITY): Payer: Medicare Other

## 2013-08-07 ENCOUNTER — Encounter (HOSPITAL_COMMUNITY): Payer: Medicare Other

## 2013-08-09 ENCOUNTER — Encounter (HOSPITAL_COMMUNITY): Payer: Medicare Other

## 2013-08-10 ENCOUNTER — Ambulatory Visit (INDEPENDENT_AMBULATORY_CARE_PROVIDER_SITE_OTHER): Payer: Medicare Other | Admitting: Otolaryngology

## 2013-08-11 ENCOUNTER — Encounter (HOSPITAL_COMMUNITY): Payer: Medicare Other

## 2013-08-14 ENCOUNTER — Encounter (HOSPITAL_COMMUNITY): Payer: Medicare Other

## 2013-08-16 ENCOUNTER — Encounter (HOSPITAL_COMMUNITY): Payer: Medicare Other

## 2013-08-18 ENCOUNTER — Encounter (HOSPITAL_COMMUNITY): Payer: Medicare Other

## 2013-08-21 ENCOUNTER — Telehealth: Payer: Self-pay | Admitting: *Deleted

## 2013-08-21 ENCOUNTER — Ambulatory Visit (INDEPENDENT_AMBULATORY_CARE_PROVIDER_SITE_OTHER): Payer: Medicare Other | Admitting: Physician Assistant

## 2013-08-21 ENCOUNTER — Encounter (HOSPITAL_COMMUNITY): Payer: Medicare Other

## 2013-08-21 ENCOUNTER — Encounter: Payer: Self-pay | Admitting: Physician Assistant

## 2013-08-21 VITALS — BP 119/71 | HR 99 | Ht 69.0 in | Wt 181.0 lb

## 2013-08-21 DIAGNOSIS — I2510 Atherosclerotic heart disease of native coronary artery without angina pectoris: Secondary | ICD-10-CM | POA: Diagnosis not present

## 2013-08-21 DIAGNOSIS — F17209 Nicotine dependence, unspecified, with unspecified nicotine-induced disorders: Secondary | ICD-10-CM

## 2013-08-21 DIAGNOSIS — I7090 Unspecified atherosclerosis: Secondary | ICD-10-CM

## 2013-08-21 DIAGNOSIS — F17200 Nicotine dependence, unspecified, uncomplicated: Secondary | ICD-10-CM

## 2013-08-21 DIAGNOSIS — I10 Essential (primary) hypertension: Secondary | ICD-10-CM

## 2013-08-21 DIAGNOSIS — Z716 Tobacco abuse counseling: Secondary | ICD-10-CM

## 2013-08-21 DIAGNOSIS — I70219 Atherosclerosis of native arteries of extremities with intermittent claudication, unspecified extremity: Secondary | ICD-10-CM

## 2013-08-21 DIAGNOSIS — Z720 Tobacco use: Secondary | ICD-10-CM

## 2013-08-21 DIAGNOSIS — Z7189 Other specified counseling: Secondary | ICD-10-CM

## 2013-08-21 MED ORDER — NICOTINE 21 MG/24HR TD PT24: 21.0000 mg | MEDICATED_PATCH | Freq: Every day | TRANSDERMAL | Status: DC

## 2013-08-21 NOTE — Progress Notes (Signed)
HPI:   This is a 52 year old male patient of Dr.Koneswaran who has history of coronary artery disease status post drug-eluting stent to the circumflex on 04/04/13 with moderate obstructive disease in the distal LAD, ejection fraction 45%. He is being treated with Effient and Aspirin. Beta blocker was not given because of bradycardia.  He has a history of peripheral arterial disease with occlusion of the right internal iliac artery with significant atherosclerosis in the left internal iliac which was not amenable to reconstruction per notes from [facility] therapy was recommended. He also has a history hypertension, tobacco abuse, DVT, rectal cancer, and chronic pain followed by pain management.  The patient comes in today for routine followup. He says he had indigestion yesterday after eating a steak biscuit relieved with antacids. He denies any chest pain, palpitations, dyspnea, dyspnea on exertion, dizziness or presyncope. He has chronic leg pain due to his PAD. He says he can't walk far without it hurting. He also says his insurance will no longer pay for Effient. He continues to smoke a pack of cigarettes daily.    Allergies:  -- Darvocet [Propoxyphene-Acetaminophen] -- Palpitations  Current Outpatient Prescriptions on File Prior to Visit: amLODipine (NORVASC) 5 MG tablet, Take 1 tablet (5 mg total) by mouth daily., Disp: 30 tablet, Rfl: 6 aspirin EC 81 MG tablet, Take 81 mg by mouth daily., Disp: , Rfl:  atorvastatin (LIPITOR) 80 MG tablet, Take 1 tablet (80 mg total) by mouth every evening., Disp: 30 tablet, Rfl: 6 gabapentin (NEURONTIN) 300 MG capsule, Take 900-1,200 mg by mouth 3 (three) times daily. Takes 900mg  in the morning, 1200mg  in the afternoon, and 1200mg  in the evening., Disp: , Rfl:  isosorbide mononitrate (IMDUR) 30 MG 24 hr tablet, Take 0.5 tablets (15 mg total) by mouth daily. Take 1/2 tab once daily to equal 15 mg, Disp: 30 tablet, Rfl: 6 lisinopril (PRINIVIL,ZESTRIL)  40 MG tablet, Take 40 mg by mouth daily., Disp: , Rfl:  nitroGLYCERIN (NITROSTAT) 0.4 MG SL tablet, Place 1 tablet (0.4 mg total) under the tongue every 5 (five) minutes as needed for chest pain (up to 3 doses)., Disp: 25 tablet, Rfl: 3 omeprazole (PRILOSEC) 40 MG capsule, Take 40 mg by mouth daily., Disp: , Rfl:  prasugrel (EFFIENT) 10 MG TABS tablet, Take 1 tablet (10 mg total) by mouth daily., Disp: 30 tablet, Rfl: 6  No current facility-administered medications on file prior to visit.   Past Medical History:   Hypertension                                                 DVT (deep venous thrombosis)                    ~ 2013       Rectal cancer                                                Coronary artery disease                                        Comment:a. 03/2013: abnl nuc -> LHC  s/p DES to LCx,               residual moderate disease in LAD (med rx unless              refractory angina). b. Not on BB due to               bradycardia.   History of blood transfusion                                   Comment:"once; after throwing up alot of blood"               (04/17/2013)   GERD (gastroesophageal reflux disease)                       Chronic lower back pain                                        Comment:a. Followed by pain management at [facility].   PAD (peripheral artery disease)                                Comment:a. Occlusion of the right internal iliac               artery, with significant atherosclerosis in the              left internal iliac which was not amenable to               reconstruction per notes from [facility]   LV dysfunction                                                 Comment:a. EF 45% in 03/2013.   Tobacco abuse                                               Past Surgical History:   COLOSTOMY                                                     ABDOMINAL SURGERY                                1990's         Comment:'for stomach ulcers"  (04/17/2013)   CORONARY ANGIOPLASTY WITH STENT PLACEMENT        04/17/2013      Comment:"?1" (04/17/2013)   INGUINAL HERNIA REPAIR                          Bilateral 1990's       COLOSTOMY TAKEDOWN  2013         COLECTOMY                                        2012           Comment:"for rectal cancer" (04/17/2013)  No family history on file.   Social History   Marital Status: Single              Spouse Name:                      Years of Education:                 Number of children:             Occupational History   None on file  Social History Main Topics   Smoking Status: Current Every Day Smoker        Packs/Day: 1.00  Years: 40        Types: Cigarettes   Smokeless Status: Current User                     Alcohol Use: Yes           4.8 oz/week      6 Cans of beer, 2 Shots of liquor per week      Comment: 04/17/2013 "bout a 6 pack/wk"   Drug Use: Yes               Special: Marijuana      Comment: last use 2 days ago   Sexual Activity: Yes                    Birth Control/Protection: None  Other Topics            Concern   None on file  Social History Narrative   None on file    ROS: See history of present illness otherwise negative   PHYSICAL EXAM: Well-nournished, in no acute distress. Neck: No JVD, HJR, Bruit, or thyroid enlargement  Lungs: No tachypnea, clear without wheezing, rales, or rhonchi  Cardiovascular: RRR, PMI not displaced, positive S4, no murmurs, bruit, thrill, or heave.  Abdomen: BS normal. Soft without organomegaly, masses, lesions or tenderness.  Extremities: without cyanosis, clubbing or edema. Decreased distal pulses bilateral  SKin: Warm, no lesions or rashes   Musculoskeletal: No deformities  Neuro: no focal signs  BP 119/71  Pulse 99  Ht 5\' 9"  (1.753 m)  Wt 181 lb (82.101 kg)  BMI 26.72 kg/m2

## 2013-08-21 NOTE — Telephone Encounter (Signed)
Mailed out pt assistance form for Effient for pt to fill out and send back to office.

## 2013-08-21 NOTE — Assessment & Plan Note (Signed)
Patient continues to have claudication symptoms with little activity. Smoking cessation is necessary.

## 2013-08-21 NOTE — Assessment & Plan Note (Signed)
Controlled.  

## 2013-08-21 NOTE — Assessment & Plan Note (Signed)
The patient has significant coronary artery disease and continues to smoke a pack of cigarettes a day. He has not had chest pain. Continue them door. Smoking cessation discussed. He can no longer afford Effient as his insurance will not pay for it. We will help him with samples and a drug assistance program so he can continue this.

## 2013-08-21 NOTE — Patient Instructions (Addendum)
Your physician recommends that you schedule a follow-up appointment in: 2 months with Dr Bronson Ing  Your physician recommends you quit smoking.   Your physician has recommended you make the following change in your medication:  Patches for smoking

## 2013-08-21 NOTE — Assessment & Plan Note (Signed)
Patient continues to smoke a pack of cigarettes daily. Had a long discussion with him about smoking cessation. Nicotine patches recommended and prescribed.

## 2013-08-23 ENCOUNTER — Encounter (HOSPITAL_COMMUNITY): Payer: Medicare Other

## 2013-08-25 ENCOUNTER — Encounter (HOSPITAL_COMMUNITY): Payer: Medicare Other

## 2013-10-23 ENCOUNTER — Encounter: Payer: Self-pay | Admitting: Cardiovascular Disease

## 2013-10-23 ENCOUNTER — Ambulatory Visit (INDEPENDENT_AMBULATORY_CARE_PROVIDER_SITE_OTHER): Payer: Medicare Other | Admitting: Cardiovascular Disease

## 2013-10-23 VITALS — BP 112/70 | HR 99 | Ht 69.0 in | Wt 179.0 lb

## 2013-10-23 DIAGNOSIS — I2510 Atherosclerotic heart disease of native coronary artery without angina pectoris: Secondary | ICD-10-CM

## 2013-10-23 DIAGNOSIS — I739 Peripheral vascular disease, unspecified: Secondary | ICD-10-CM

## 2013-10-23 DIAGNOSIS — F17200 Nicotine dependence, unspecified, uncomplicated: Secondary | ICD-10-CM

## 2013-10-23 DIAGNOSIS — Z7189 Other specified counseling: Secondary | ICD-10-CM

## 2013-10-23 DIAGNOSIS — Z716 Tobacco abuse counseling: Secondary | ICD-10-CM

## 2013-10-23 DIAGNOSIS — F17209 Nicotine dependence, unspecified, with unspecified nicotine-induced disorders: Secondary | ICD-10-CM

## 2013-10-23 DIAGNOSIS — I70219 Atherosclerosis of native arteries of extremities with intermittent claudication, unspecified extremity: Secondary | ICD-10-CM

## 2013-10-23 DIAGNOSIS — N529 Male erectile dysfunction, unspecified: Secondary | ICD-10-CM

## 2013-10-23 DIAGNOSIS — E785 Hyperlipidemia, unspecified: Secondary | ICD-10-CM

## 2013-10-23 DIAGNOSIS — I7090 Unspecified atherosclerosis: Secondary | ICD-10-CM

## 2013-10-23 DIAGNOSIS — I10 Essential (primary) hypertension: Secondary | ICD-10-CM

## 2013-10-23 MED ORDER — METOPROLOL SUCCINATE ER 50 MG PO TB24: 50.0000 mg | ORAL_TABLET | Freq: Every day | ORAL | Status: DC

## 2013-10-23 NOTE — Progress Notes (Signed)
Name: Juan Wheeler    DOB: 04-Nov-1960  Age: 53 y.o.  MR#: [id number]       PCP:  Provider Not In System      Insurance: Payor: MEDICARE / Plan: MEDICARE PART A AND B / Product Type: *No Product type* /   CC:   No chief complaint on file.   VS Filed Vitals:   10/23/13 1255  BP: 112/70  Pulse: 99  Height: 5\' 9"  (1.753 m)  Weight: 179 lb (81.194 kg)    Weights Current Weight  10/23/13 179 lb (81.194 kg)  08/21/13 181 lb (82.101 kg)  05/19/13 188 lb (85.276 kg)    Blood Pressure  BP Readings from Last 3 Encounters:  10/23/13 112/70  08/21/13 119/71  07/25/13 140/91     Admit date:  (Not on file) Last encounter with RMR:  Visit date not found   Allergy Darvocet  Current Outpatient Prescriptions  Medication Sig Dispense Refill  . amLODipine (NORVASC) 5 MG tablet Take 1 tablet (5 mg total) by mouth daily.  30 tablet  6  . aspirin EC 81 MG tablet Take 81 mg by mouth daily.      Marland Kitchen atorvastatin (LIPITOR) 80 MG tablet Take 1 tablet (80 mg total) by mouth every evening.  30 tablet  6  . gabapentin (NEURONTIN) 300 MG capsule Take 900-1,200 mg by mouth 3 (three) times daily. Takes 900mg  in the morning, 1200mg  in the afternoon, and 1200mg  in the evening.      . isosorbide mononitrate (IMDUR) 30 MG 24 hr tablet Take 0.5 tablets (15 mg total) by mouth daily. Take 1/2 tab once daily to equal 15 mg  30 tablet  6  . lisinopril (PRINIVIL,ZESTRIL) 40 MG tablet TAKE 1 TABLET (40 MG TOTAL) BY MOUTH DAILY.      . nitroGLYCERIN (NITROSTAT) 0.4 MG SL tablet Place 1 tablet (0.4 mg total) under the tongue every 5 (five) minutes as needed for chest pain (up to 3 doses).  25 tablet  3  . omeprazole (PRILOSEC) 40 MG capsule Take 40 mg by mouth daily.      . prasugrel (EFFIENT) 10 MG TABS tablet Take 1 tablet (10 mg total) by mouth daily.  30 tablet  6   No current facility-administered medications for this visit.    Discontinued Meds:    Medications Discontinued During This Encounter  Medication  Reason  . lisinopril (PRINIVIL,ZESTRIL) 40 MG tablet   . nicotine (NICODERM CQ) 21 mg/24hr patch Error    Patient Active Problem List   Diagnosis Date Noted  . ASCVD (arteriosclerotic cardiovascular disease) 05/02/2013  . Unstable angina 04/18/2013  . Chest pain 03/29/2013  . Overweight 03/29/2013  . Tobacco abuse 03/29/2013  . Hypertension   . Chronic pain syndrome 11/09/2012  . Pain in joint, pelvic region and thigh 11/09/2012  . Neuralgia and neuritis 10/12/2012  . Atherosclerosis of native arteries of the extremities with intermittent claudication 08/19/2012  . Backache 03/24/2012  . Peripheral vascular disease 12/04/2011  . Hyposmolality and/or hyponatremia 12/04/2011  . Acute posthemorrhagic anemia 12/04/2011  . Compression of vein 12/04/2011  . Heartburn 12/03/2011  . Nausea with vomiting 12/03/2011  . Personal history of digestive disease 11/19/2011  . Chronic pain 11/12/2011  . Depressive disorder 09/24/2011  . Dysuria 08/31/2011  . Impotence of organic origin 08/31/2011  . Urinary frequency 08/31/2011  . Hematoma complicating a procedure 06/25/2011  . Constipation 06/05/2011  . Essential hypertension 06/05/2011  . Anal or rectal pain 06/05/2011  .  Malignant neoplasm of rectum 12/02/2010    LABS    Component Value Date/Time   NA 139 04/18/2013 0545   NA 143 04/14/2013 1617   NA 139 03/29/2013 0528   K 3.5 04/18/2013 0545   K 4.3 04/14/2013 1617   K 4.0 03/29/2013 0528   CL 104 04/18/2013 0545   CL 108 04/14/2013 1617   CL 105 03/29/2013 0528   CO2 24 04/18/2013 0545   CO2 28 04/14/2013 1617   CO2 25 03/29/2013 0528   GLUCOSE 106* 04/18/2013 0545   GLUCOSE 104* 04/14/2013 1617   GLUCOSE 108* 03/29/2013 0528   BUN 11 04/18/2013 0545   BUN 14 04/14/2013 1617   BUN 11 03/29/2013 0528   CREATININE 1.15 04/18/2013 0545   CREATININE 1.10 04/14/2013 1617   CREATININE 1.07 03/29/2013 0528   CREATININE 1.20 03/28/2013 2324   CALCIUM 8.7 04/18/2013 0545   CALCIUM 9.4 04/14/2013 1617    CALCIUM 8.8 03/29/2013 0528   GFRNONAA 71* 04/18/2013 0545   GFRNONAA 77* 03/29/2013 0528   GFRNONAA 67* 03/28/2013 2324   GFRAA 82* 04/18/2013 0545   GFRAA 90* 03/29/2013 0528   GFRAA 78* 03/28/2013 2324   CMP     Component Value Date/Time   NA 139 04/18/2013 0545   K 3.5 04/18/2013 0545   CL 104 04/18/2013 0545   CO2 24 04/18/2013 0545   GLUCOSE 106* 04/18/2013 0545   BUN 11 04/18/2013 0545   CREATININE 1.15 04/18/2013 0545   CREATININE 1.10 04/14/2013 1617   CALCIUM 8.7 04/18/2013 0545   PROT 7.0 03/29/2013 0528   ALBUMIN 3.1* 03/29/2013 0528   AST 14 03/29/2013 0528   ALT 7 03/29/2013 0528   ALKPHOS 72 03/29/2013 0528   BILITOT 0.5 03/29/2013 0528   GFRNONAA 71* 04/18/2013 0545   GFRAA 82* 04/18/2013 0545       Component Value Date/Time   WBC 7.0 04/18/2013 0545   WBC 5.9 04/14/2013 1617   WBC 6.6 03/29/2013 0528   HGB 16.0 04/18/2013 0545   HGB 16.9 04/14/2013 1617   HGB 16.2 03/29/2013 0528   HCT 45.7 04/18/2013 0545   HCT 47.2 04/14/2013 1617   HCT 46.7 03/29/2013 0528   MCV 95.2 04/18/2013 0545   MCV 93.1 04/14/2013 1617   MCV 95.9 03/29/2013 0528    Lipid Panel     Component Value Date/Time   CHOL 161 03/29/2013 0517   TRIG 100 03/29/2013 0517   HDL 45 03/29/2013 0517   CHOLHDL 3.6 03/29/2013 0517   VLDL 20 03/29/2013 0517   LDLCALC 96 03/29/2013 0517    ABG No results found for this basename: phart, pco2, pco2art, po2, po2art, hco3, tco2, acidbasedef, o2sat     Lab Results  Component Value Date   TSH 1.083 03/29/2013   BNP (last 3 results) No results found for this basename: PROBNP,  in the last 8760 hours Cardiac Panel (last 3 results) No results found for this basename: CKTOTAL, CKMB, TROPONINI, RELINDX,  in the last 72 hours  Iron/TIBC/Ferritin No results found for this basename: iron, tibc, ferritin     EKG Orders placed in visit on 05/02/13  . EKG 12-LEAD     Prior Assessment and Plan Problem List as of 10/23/2013   Chest pain   Last Assessment & Plan   04/14/2013 Office Visit Written  04/14/2013  7:03 PM by Lendon Colonel, NP     The patient had an abnormal stress Myoview demonstrating a fixed defect in anterior wall  that is relatively small, cannot rule out scar but no active ischemia in that distribution. However there was a small area of ischemia in the mid to basal lateral wall. LVEF is 42% with global hypokinesis. The patient has ongoing chest discomfort which he noticed mostly at nighttime. With history of peripheral arterial disease, and hypertension. The patient will likely benefit from diagnostic cardiac catheterization for definitive evaluation of CAD. I have discussed this with Dr. Pennelope Bracken on-site, who has reviewed the chart and is in agreement that the patient would benefit from cath. I have discussed risks and benefits to include bleeding IV dye reaction and death. The patient verbalizes understanding and is willing to proceed. He has been provided the literature concerning cardiac catheterization in written form for him to review prior to the catheterization as well. Precath orders are written he is scheduled for 04/17/2013.    Hypertension   Last Assessment & Plan   05/02/2013 Office Visit Written 05/02/2013  3:10 PM by Lendon Colonel, NP     Blood pressure is well controlled. The best I have seen it since he has been in the clinic.  He will continue on current medications without changes. He will be seen in 3 months.    Overweight   Tobacco abuse   Last Assessment & Plan   08/21/2013 Office Visit Written 08/21/2013  1:23 PM by Imogene Burn, [name]     Patient continues to smoke a pack of cigarettes daily. Had a long discussion with him about smoking cessation. Nicotine patches recommended and prescribed.    Atherosclerosis of native arteries of the extremities with intermittent claudication   Last Assessment & Plan   08/21/2013 Office Visit Written 08/21/2013  1:24 PM by Imogene Burn, [name]     Patient continues to have claudication symptoms with little  activity. Smoking cessation is necessary.    Backache   Chronic pain syndrome   Chronic pain   Peripheral vascular disease   Constipation   Depressive disorder   Dysuria   Heartburn   Hematoma complicating a procedure   Pain in joint, pelvic region and thigh   Personal history of digestive disease   Essential hypertension   Last Assessment & Plan   08/21/2013 Office Visit Written 08/21/2013  1:22 PM by Imogene Burn, [name]     Controlled    Hyposmolality and/or hyponatremia   Impotence of organic origin   Malignant neoplasm of rectum   Nausea with vomiting   Neuralgia and neuritis   Acute posthemorrhagic anemia   Anal or rectal pain   Compression of vein   Urinary frequency   Unstable angina   ASCVD (arteriosclerotic cardiovascular disease)   Last Assessment & Plan   08/21/2013 Office Visit Written 08/21/2013  1:22 PM by Imogene Burn, [name]     The patient has significant coronary artery disease and continues to smoke a pack of cigarettes a day. He has not had chest pain. Continue them door. Smoking cessation discussed. He can no longer afford Effient as his insurance will not pay for it. We will help him with samples and a drug assistance program so he can continue this.        Imaging: No results found.

## 2013-10-23 NOTE — Patient Instructions (Addendum)
Your physician wants you to follow-up in: 6 months  You will receive a reminder letter in the mail two months in advance. If you don't receive a letter, please call our office to schedule the follow-up appointment.    Your physician has recommended you make the following change in your medication:   START  Toprol XL 25 mg daily   You must HOLD your IMDUR 24 hrs before using Viagra

## 2013-10-23 NOTE — Progress Notes (Signed)
Patient ID: Juan Wheeler, male   DOB: 08/29/1961, 53 y.o.   MRN: [id number]      SUBJECTIVE: The patient is here for routine cardiovascular followup. He has a history of drug-eluting stent placement to the left circumflex coronary artery in July 2014 with moderate to severe obstructive disease in the distal LAD and mild disease in proximal LAD and right coronary artery. He also has hypertension, hyperlipidemia, rectal cancer, history of tobacco abuse, and peripheral vascular disease. He very seldom experiences chest tightness, and this is only if he overexerts himself. On one occasion it was relieved with one sublingual nitroglycerin tablet. He says he cannot afford nicotine patches. However he spends $3.51 daily for cigarettes. He no longer uses alcohol or marijuana. He has pain in his lower back and says he has sciatica. He also has pain in his left leg with exertion and sometimes experiences it with the right leg. He has had problems with erectile dysfunction.    Allergies  Allergen Reactions  . Darvocet [Propoxyphene N-Acetaminophen] Palpitations    Current Outpatient Prescriptions  Medication Sig Dispense Refill  . amLODipine (NORVASC) 5 MG tablet Take 1 tablet (5 mg total) by mouth daily.  30 tablet  6  . aspirin EC 81 MG tablet Take 81 mg by mouth daily.      Marland Kitchen atorvastatin (LIPITOR) 80 MG tablet Take 1 tablet (80 mg total) by mouth every evening.  30 tablet  6  . gabapentin (NEURONTIN) 300 MG capsule Take 900-1,200 mg by mouth 3 (three) times daily. Takes 900mg  in the morning, 1200mg  in the afternoon, and 1200mg  in the evening.      . isosorbide mononitrate (IMDUR) 30 MG 24 hr tablet Take 0.5 tablets (15 mg total) by mouth daily. Take 1/2 tab once daily to equal 15 mg  30 tablet  6  . lisinopril (PRINIVIL,ZESTRIL) 40 MG tablet TAKE 1 TABLET (40 MG TOTAL) BY MOUTH DAILY.      . nitroGLYCERIN (NITROSTAT) 0.4 MG SL tablet Place 1 tablet (0.4 mg total) under the tongue every 5 (five)  minutes as needed for chest pain (up to 3 doses).  25 tablet  3  . omeprazole (PRILOSEC) 40 MG capsule Take 40 mg by mouth daily.      . prasugrel (EFFIENT) 10 MG TABS tablet Take 1 tablet (10 mg total) by mouth daily.  30 tablet  6   No current facility-administered medications for this visit.    Past Medical History  Diagnosis Date  . Hypertension   . DVT (deep venous thrombosis) ~ 2013  . Rectal cancer   . Coronary artery disease     a. 03/2013: abnl nuc -> LHC s/p DES to LCx, residual moderate disease in LAD (med rx unless refractory angina). b. Not on BB due to bradycardia.  Marland Kitchen History of blood transfusion     "once; after throwing up alot of blood" (04/17/2013)  . GERD (gastroesophageal reflux disease)   . Chronic lower back pain     a. Follow[facility] at [facility].  . PAD (peripheral artery disease)     a. Occlusion of the right internal iliac artery, with significant atherosclerosis in the left internal iliac which was not amenable to reconstruction per notes from [facility]  . LV dysfunction     a. EF 45% in 03/2013.  . Tobacco abuse     Past Surgical History  Procedure Laterality Date  . Colostomy    . Abdominal surgery  1990's    '  for stomach ulcers" (04/17/2013)  . Coronary angioplasty with stent placement  04/17/2013    "?1" (04/17/2013)  . Inguinal hernia repair Bilateral 1990's  . Colostomy takedown  2013  . Colectomy  2012    "for rectal cancer" (04/17/2013)    History   Social History  . Marital Status: Single    Spouse Name: N/A    Number of Children: N/A  . Years of Education: N/A   Occupational History  . Not on file.   Social History Main Topics  . Smoking status: Current Every Day Smoker -- 1.00 packs/day for 40 years    Types: Cigarettes  . Smokeless tobacco: Current User  . Alcohol Use: 4.8 oz/week    6 Cans of beer, 2 Shots of liquor per week     Comment: 04/17/2013 "bout a 6 pack/wk"  . Drug Use: Yes    Special: Marijuana      Comment: last use 2 days ago  . Sexual Activity: Yes    Birth Control/ Protection: None   Other Topics Concern  . Not on file   Social History Narrative  . No narrative on file     Filed Vitals:   10/23/13 1255  BP: 112/70  Pulse: 99  Height: 5\' 9"  (1.753 m)  Weight: 179 lb (81.194 kg)    PHYSICAL EXAM General: NAD Neck: No JVD, no thyromegaly or thyroid nodule.  Lungs: Clear to auscultation bilaterally with normal respiratory effort. CV: Nondisplaced PMI.  Heart regular, HR 100 bpm, normal S1/S2, no S3/S4, no murmur.  No peripheral edema.  No carotid bruit.   Abdomen: Soft, nontender, no hepatosplenomegaly, no distention.  Neurologic: Alert and oriented x 3.  Psych: Normal affect. Extremities: No clubbing or cyanosis.   ECG: reviewed and available in electronic records.      ASSESSMENT AND PLAN: 1. CAD: continue ASA, Lipitor, Imdur, and Effient. Beta blockers reportedly not prescribed in past due to issues with bradycardia. However, his resting HR is 100 bpm today. I will start Toprol-XL 25 mg daily. 2. HTN: controlled on present therapy which includes lisinopril and amlodipine. 3. Hyperlipidemia: on high dose Lipitor 80 mg daily. 4. Tobacco abuse: extensive counseling provided with respect to cessation. I am not certain how motivated he is to quit, but finances are not the primary issue. 5. Erectile dysfunction: will provide samples of Viagra, and he has been instructed not to take nitrates when doing so. 6. PVD: continue medical therapy. Tobacco cessation is paramount.  Dispo: f/u 6 months.  Kate Sable, M.D., F.A.C.C.      SUBJECTIVE:    Allergies  Allergen Reactions  . Darvocet [Propoxyphene N-Acetaminophen] Palpitations    Current Outpatient Prescriptions  Medication Sig Dispense Refill  . amLODipine (NORVASC) 5 MG tablet Take 1 tablet (5 mg total) by mouth daily.  30 tablet  6  . aspirin EC 81 MG tablet Take 81 mg by mouth daily.      Marland Kitchen  atorvastatin (LIPITOR) 80 MG tablet Take 1 tablet (80 mg total) by mouth every evening.  30 tablet  6  . gabapentin (NEURONTIN) 300 MG capsule Take 900-1,200 mg by mouth 3 (three) times daily. Takes 900mg  in the morning, 1200mg  in the afternoon, and 1200mg  in the evening.      . isosorbide mononitrate (IMDUR) 30 MG 24 hr tablet Take 0.5 tablets (15 mg total) by mouth daily. Take 1/2 tab once daily to equal 15 mg  30 tablet  6  . lisinopril (PRINIVIL,ZESTRIL) 40  MG tablet TAKE 1 TABLET (40 MG TOTAL) BY MOUTH DAILY.      . nitroGLYCERIN (NITROSTAT) 0.4 MG SL tablet Place 1 tablet (0.4 mg total) under the tongue every 5 (five) minutes as needed for chest pain (up to 3 doses).  25 tablet  3  . omeprazole (PRILOSEC) 40 MG capsule Take 40 mg by mouth daily.      . prasugrel (EFFIENT) 10 MG TABS tablet Take 1 tablet (10 mg total) by mouth daily.  30 tablet  6   No current facility-administered medications for this visit.    Past Medical History  Diagnosis Date  . Hypertension   . DVT (deep venous thrombosis) ~ 2013  . Rectal cancer   . Coronary artery disease     a. 03/2013: abnl nuc -> LHC s/p DES to LCx, residual moderate disease in LAD (med rx unless refractory angina). b. Not on BB due to bradycardia.  Marland Kitchen History of blood transfusion     "once; after throwing up alot of blood" (04/17/2013)  . GERD (gastroesophageal reflux disease)   . Chronic lower back pain     a. Followed by [facility] management at [facility].  . PAD (peripheral artery disease)     a. Occlusion of the right internal iliac artery, with significant atherosclerosis in the left internal iliac which was not amenable to reconstruction per notes from [facility].  . LV dysfunction     a. EF 45% in 03/2013.  . Tobacco abuse     Past Surgical History  Procedure Laterality Date  . Colostomy    . Abdominal surgery  1990's    'for stomach ulcers" (04/17/2013)  . Coronary angioplasty with stent placement  04/17/2013    "?1" (04/17/2013)    . Inguinal hernia repair Bilateral 1990's  . Colostomy takedown  2013  . Colectomy  2012    "for rectal cancer" (04/17/2013)    History   Social History  . Marital Status: Single    Spouse Name: N/A    Number of Children: N/A  . Years of Education: N/A   Occupational History  . Not on file.   Social History Main Topics  . Smoking status: Current Every Day Smoker -- 1.00 packs/day for 40 years    Types: Cigarettes  . Smokeless tobacco: Current User  . Alcohol Use: 4.8 oz/week    6 Cans of beer, 2 Shots of liquor per week     Comment: 04/17/2013 "bout a 6 pack/wk"  . Drug Use: Yes    Special: Marijuana     Comment: last use 2 days ago  . Sexual Activity: Yes    Birth Control/ Protection: None   Other Topics Concern  . Not on file   Social History Narrative  . No narrative on file     Filed Vitals:   10/23/13 1255  BP: 112/70  Pulse: 99  Height: 5\' 9"  (1.753 m)  Weight: 179 lb (81.194 kg)    PHYSICAL EXAM General: NAD Neck: No JVD, no thyromegaly or thyroid nodule.  Lungs: Clear to auscultation bilaterally with normal respiratory effort. CV: Nondisplaced PMI.  Heart regular S1/S2, no S3/S4, no murmur.  No peripheral edema.  No carotid bruit.  Normal pedal pulses.  Abdomen: Soft, nontender, no hepatosplenomegaly, no distention.  Neurologic: Alert and oriented x 3.  Psych: Normal affect. Extremities: No clubbing or cyanosis.   ECG: reviewed and available in electronic records.      ASSESSMENT AND PLAN: 1. CAD  2. HTN 3. Hyperlipidemia 4. PVD   Kate Sable, M.D., F.A.C.C.

## 2013-10-25 ENCOUNTER — Encounter (HOSPITAL_COMMUNITY): Payer: Medicare Other

## 2013-10-25 ENCOUNTER — Encounter (HOSPITAL_COMMUNITY): Payer: Self-pay

## 2013-10-25 ENCOUNTER — Encounter (HOSPITAL_COMMUNITY): Payer: Medicare Other | Attending: Hematology and Oncology

## 2013-10-25 VITALS — BP 138/87 | HR 83 | Temp 97.4°F | Resp 20 | Ht 69.0 in | Wt 182.7 lb

## 2013-10-25 DIAGNOSIS — N509 Disorder of male genital organs, unspecified: Secondary | ICD-10-CM

## 2013-10-25 DIAGNOSIS — C20 Malignant neoplasm of rectum: Secondary | ICD-10-CM

## 2013-10-25 DIAGNOSIS — Z09 Encounter for follow-up examination after completed treatment for conditions other than malignant neoplasm: Secondary | ICD-10-CM | POA: Insufficient documentation

## 2013-10-25 DIAGNOSIS — Z85048 Personal history of other malignant neoplasm of rectum, rectosigmoid junction, and anus: Secondary | ICD-10-CM | POA: Diagnosis not present

## 2013-10-25 DIAGNOSIS — K219 Gastro-esophageal reflux disease without esophagitis: Secondary | ICD-10-CM | POA: Diagnosis not present

## 2013-10-25 DIAGNOSIS — R102 Pelvic and perineal pain: Secondary | ICD-10-CM

## 2013-10-25 DIAGNOSIS — I10 Essential (primary) hypertension: Secondary | ICD-10-CM | POA: Diagnosis not present

## 2013-10-25 LAB — CBC WITH DIFFERENTIAL/PLATELET
Basophils Absolute: 0 10*3/uL (ref 0.0–0.1)
Basophils Relative: 0 % (ref 0–1)
Eosinophils Absolute: 0.2 10*3/uL (ref 0.0–0.7)
Eosinophils Relative: 2 % (ref 0–5)
HCT: 48.3 % (ref 39.0–52.0)
Hemoglobin: 16.2 g/dL (ref 13.0–17.0)
Lymphocytes Relative: 32 % (ref 12–46)
Lymphs Abs: 2 10*3/uL (ref 0.7–4.0)
MCH: 31.5 pg (ref 26.0–34.0)
MCHC: 33.5 g/dL (ref 30.0–36.0)
MCV: 93.8 fL (ref 78.0–100.0)
Monocytes Absolute: 0.6 10*3/uL (ref 0.1–1.0)
Monocytes Relative: 9 % (ref 3–12)
Neutro Abs: 3.5 10*3/uL (ref 1.7–7.7)
Neutrophils Relative %: 56 % (ref 43–77)
Platelets: 211 10*3/uL (ref 150–400)
RBC: 5.15 MIL/uL (ref 4.22–5.81)
RDW: 13.7 % (ref 11.5–15.5)
WBC: 6.3 10*3/uL (ref 4.0–10.5)

## 2013-10-25 LAB — COMPREHENSIVE METABOLIC PANEL
ALT: 18 U/L (ref 0–53)
AST: 20 U/L (ref 0–37)
Albumin: 3.6 g/dL (ref 3.5–5.2)
Alkaline Phosphatase: 77 U/L (ref 39–117)
BUN: 15 mg/dL (ref 6–23)
CO2: 23 mEq/L (ref 19–32)
Calcium: 9.2 mg/dL (ref 8.4–10.5)
Chloride: 106 mEq/L (ref 96–112)
Creatinine, Ser: 1.2 mg/dL (ref 0.50–1.35)
GFR calc Af Amer: 78 mL/min — ABNORMAL LOW (ref >90)
GFR calc non Af Amer: 67 mL/min — ABNORMAL LOW (ref >90)
Glucose, Bld: 90 mg/dL (ref 70–99)
Potassium: 4.3 mEq/L (ref 3.7–5.3)
Sodium: 142 mEq/L (ref 137–147)
Total Bilirubin: 0.4 mg/dL (ref 0.3–1.2)
Total Protein: 8.1 g/dL (ref 6.0–8.3)

## 2013-10-25 LAB — LACTATE DEHYDROGENASE: LDH: 273 U/L — ABNORMAL HIGH (ref 94–250)

## 2013-10-25 MED ORDER — OXYCODONE HCL 10 MG PO TABS: ORAL_TABLET | ORAL | Status: DC

## 2013-10-25 NOTE — Progress Notes (Signed)
Southmayd A. Barnet Glasgow, M.D.  NEW PATIENT EVALUATION   Name: Juan Wheeler Date: 10/25/2013 MRN: [id number] DOB: [birth year]  PCP: Provider Not In System   REFERRING PHYSICIAN: No ref. provider found  REASON FOR REFERRAL: Followup of rectal cancer originally diagnosed in March of 2012     HISTORY OF PRESENT ILLNESS:Juan Wheeler is a 53 y.o. male who is transferring care from [facility] for followup of rectal cancer diagnosed in March of 2012 and treated with preoperative Xeloda plus radiotherapy followed by low anterior resection and loop ileostomy (05/11/2011) and because of compounding factors including severe rectal pain., never received any additional adjuvant therapy for original T3 tumor diagnosed by endoscopic ultrasound. Ileostomy was taken down  3 months after surgery. His primary problem is severe perineal pain radiating up into the lower spine and down both buttox.. Is taking approximately 3300 mg of gabapentin per day without much relief. Suffers with intermittent diarrhea and constipation. Denies any dysuria or urinary hesitancy. Denies any cough, wheezing, PND, orthopnea, palpitations, chest pain, headache, or seizures. He denies any epistaxis, easy bruising, melena, hematochezia, hematuria, or hemoptysis.   PAST MEDICAL HISTORY:  has a past medical history of Hypertension; DVT (deep venous thrombosis) (~ 2013); Rectal cancer; Coronary artery disease; History of blood transfusion; GERD (gastroesophageal reflux disease); Chronic lower back pain; PAD (peripheral artery disease); LV dysfunction; and Tobacco abuse.  Oncologic recap [facility]) DIAGNOSIS: Pre-radiation/chemotherapy Clinical Stage IIA rectal adenocarcinoma (uT3N0 clinical M0), ypT2pN0 after neoadjuvant chemotherapy.On initial presentation, his course was complicated or delayed secondary to finding on preoperative imaging showing a tracheal lesion  and nasopharynx lesion as well as several subcentimeter pulmonary nodules. ENT evaluation revealed no evidence of neoplastic process in the trachea or nasopharynx and a biopsy of a lung nodule did not show any evidence of malignancy.  2. Concurrent chemo-RT with Xeloda as a radiosensitizer at a dose of 1500 mg p.o. B.i.d. Monday through Friday 02/03/2011 through 03/13/2011.  3. He underwent a LAR with temporary loop ileostomy by [name] on 05/11/2011. Path showed residual invasive adenocarcinoma invasive into the muscular propria margins negative, ypT2pN0.  4. Initially planned for adjuvant XELOX which we elected to forego as his course was complicated by uncontrolled pain. Has continued on surveillance.     PAST SURGICAL HISTORY: Past Surgical History  Procedure Laterality Date  . Colostomy    . Abdominal surgery  1990's    'for stomach ulcers" (04/17/2013)  . Coronary angioplasty with stent placement  04/17/2013    "?1" (04/17/2013)  . Inguinal hernia repair Bilateral 1990's  . Colostomy takedown  2013  . Colectomy  2012    "for rectal cancer" (04/17/2013)     CURRENT MEDICATIONS: has a current medication list which includes the following prescription(s): amlodipine, aspirin ec, atorvastatin, gabapentin, isosorbide mononitrate, lisinopril, metoprolol succinate, nitroglycerin, omeprazole, and prasugrel.   ALLERGIES: Darvocet   SOCIAL HISTORY:  reports that he has been smoking Cigarettes.  He has a 40 pack-year smoking history. He uses smokeless tobacco. He reports that he drinks about 4.8 ounces of alcohol per week. He reports that he uses illicit drugs (Marijuana).   FAMILY HISTORY: family history is not on file.    REVIEW OF SYSTEMS:  Other than that discussed above is noncontributory.    PHYSICAL EXAM:  vitals were not taken for this visit.   GENERAL:alert, no distress and comfortable SKIN: skin color, texture, turgor are normal,  no rashes or significant  lesions EYES: normal, Conjunctiva are pink and non-injected, sclera clear OROPHARYNX:no exudate, no erythema and lips, buccal mucosa, and tongue normal  NECK: supple, thyroid normal size, non-tender, without nodularity CHEST: Increased AP diameter with no breast masses. LYMPH:  no palpable lymphadenopathy in the cervical, axillary or inguinal LUNGS: clear to auscultation and percussion with normal breathing effort HEART: regular rate & rhythm and no murmurs ABDOMEN:abdomen soft, non-tender and normal bowel sounds. Multiple surgical wounds well healed. No hepatosplenomegaly. No free fluid wave or shifting dullness. MUSCULOSKELETALl:no cyanosis of digits, no clubbing or edema . Tenderness over the lumbar spine. Negative straight leg raising bilaterally. Negative Homans sign. NEURO: alert & oriented x 3 with fluent speech, no focal motor/sensory deficits    LABORATORY DATA:  No visits with results within 30 Day(s) from this visit. Latest known visit with results is:  Admission on 04/17/2013, Discharged on 04/18/2013  Component Date Value Range Status  . WBC 04/18/2013 7.0  4.0 - 10.5 K/uL Final  . RBC 04/18/2013 4.80  4.22 - 5.81 MIL/uL Final  . Hemoglobin 04/18/2013 16.0  13.0 - 17.0 g/dL Final  . HCT 04/18/2013 45.7  39.0 - 52.0 % Final  . MCV 04/18/2013 95.2  78.0 - 100.0 fL Final  . MCH 04/18/2013 33.3  26.0 - 34.0 pg Final  . MCHC 04/18/2013 35.0  30.0 - 36.0 g/dL Final  . RDW 04/18/2013 15.4  11.5 - 15.5 % Final  . Platelets 04/18/2013 180  150 - 400 K/uL Final  . Sodium 04/18/2013 139  135 - 145 mEq/L Final  . Potassium 04/18/2013 3.5  3.5 - 5.1 mEq/L Final  . Chloride 04/18/2013 104  96 - 112 mEq/L Final  . CO2 04/18/2013 24  19 - 32 mEq/L Final  . Glucose, Bld 04/18/2013 106* 70 - 99 mg/dL Final  . BUN 04/18/2013 11  6 - 23 mg/dL Final  . Creatinine, Ser 04/18/2013 1.15  0.50 - 1.35 mg/dL Final  . Calcium 04/18/2013 8.7  8.4 - 10.5 mg/dL Final  . GFR calc non Af Amer  04/18/2013 71* >90 mL/min Final  . GFR calc Af Amer 04/18/2013 82* >90 mL/min Final   Comment:                                 The eGFR has been calculated                          using the CKD EPI equation.                          This calculation has not been                          validated in all clinical                          situations.                          eGFR's persistently                          <90 mL/min signify  possible Chronic Kidney Disease.  Marland Kitchen Activated Clotting Time 04/17/2013 524   Final    Urinalysis    Component Value Date/Time   COLORURINE YELLOW 03/29/2013 0840   APPEARANCEUR CLEAR 03/29/2013 0840   LABSPEC 1.015 03/29/2013 0840   PHURINE 6.0 03/29/2013 0840   GLUCOSEU NEGATIVE 03/29/2013 0840   HGBUR NEGATIVE 03/29/2013 0840   BILIRUBINUR NEGATIVE 03/29/2013 0840   KETONESUR NEGATIVE 03/29/2013 0840   PROTEINUR NEGATIVE 03/29/2013 0840   UROBILINOGEN 1.0 03/29/2013 0840   NITRITE NEGATIVE 03/29/2013 0840   LEUKOCYTESUR NEGATIVE 03/29/2013 0840      @RADIOGRAPHY : CT OF THE CHEST, ABDOMEN AND PELVIS WITH INTRAVENOUS CONTRAST, Jul 04, 2013 10:56:57 AM([facility]) CONCLUSION:  1. No findings to suggest recurrent or metastatic disease. 2. Stable bilateral pulmonary nodules when comparing back to 06/30/2011   PATHOLOGY:uT3N0M0, ypT2pN0 adenocarcinoma   IMPRESSION:  #1. Pathological stage I rectal cancer, status post preop Xeloda plus radiotherapy for clinical T3 disease , status LAR with loop ileostomy performed in August 2012, status post closure of ileostomy with residual severe sacral and perineal pain.it was because of this discomfort that postoperative treatment with XELOX was held. #2. Hypertension, controlled. #3. Gastroesophageal reflux disease, on treatment.    PLAN:  #1. Referral to interventional radiology for sacral block. Baseline blood work. #2. Oxycodone 10-20 mg every 4 hours as needed to control pain. Told to call back in  48 hours regarding analgesic requirement. #3. Followup in 4 weeks.    Doroteo Bradford, MD 10/25/2013 1:22 PM

## 2013-10-25 NOTE — Patient Instructions (Addendum)
[facility] Discharge Instructions  RECOMMENDATIONS MADE BY THE CONSULTANT AND ANY TEST RESULTS WILL BE SENT TO YOUR REFERRING PHYSICIAN.  EXAM FINDINGS BY THE PHYSICIAN TODAY AND SIGNS OR SYMPTOMS TO REPORT TO CLINIC OR PRIMARY PHYSICIAN:  Oxycodone for pain as per prescription. Call [facility] @ [phone] and let me know how many Oxycodone tablets it is taking to control the pain. It is fine to leave a message.  Sacral plexus nerve block @ [facility] [phone]. I will have to call you to with an appointment.   Return in 1 month to see Dr. Barnet Glasgow February 25, @ 2:30     Thank you for choosing [facility] to provide your oncology and hematology care.  To afford each patient quality time with our providers, please arrive at least 15 minutes before your scheduled appointment time.  With your help, our goal is to use those 15 minutes to complete the necessary work-up to ensure our physicians have the information they need to help with your evaluation and healthcare recommendations.    Effective January 1st, 2014, we ask that you re-schedule your appointment with our physicians should you arrive 10 or more minutes late for your appointment.  We strive to give you quality time with our providers, and arriving late affects you and other patients whose appointments are after yours.    Again, thank you for choosing [facility].  Our hope is that these requests will decrease the amount of time that you wait before being seen by our physicians.       _____________________________________________________________  Should you have questions after your visit to [facility], please contact our office at [phone] between the hours of 8:30 a.m. and 5:00 p.m.  Voicemails left after 4:30 p.m. will not be returned until the following business day.  For prescription refill requests, have your pharmacy contact our office with your prescription refill  request.

## 2013-10-26 LAB — FERRITIN: Ferritin: 74 ng/mL (ref 22–322)

## 2013-10-26 LAB — CEA: CEA: 1.4 ng/mL (ref 0.0–5.0)

## 2013-10-27 ENCOUNTER — Telehealth: Payer: Self-pay | Admitting: Cardiovascular Disease

## 2013-10-27 ENCOUNTER — Other Ambulatory Visit (HOSPITAL_COMMUNITY): Payer: Self-pay | Admitting: Hematology and Oncology

## 2013-10-27 ENCOUNTER — Telehealth (HOSPITAL_COMMUNITY): Payer: Self-pay | Admitting: *Deleted

## 2013-10-27 ENCOUNTER — Telehealth (HOSPITAL_COMMUNITY): Payer: Self-pay | Admitting: Interventional Radiology

## 2013-10-27 DIAGNOSIS — C20 Malignant neoplasm of rectum: Secondary | ICD-10-CM

## 2013-10-27 MED ORDER — MORPHINE SULFATE ER 30 MG PO TBCR: EXTENDED_RELEASE_TABLET | ORAL | Status: DC

## 2013-10-27 NOTE — Addendum Note (Signed)
Encounter addended by: Norlene Duel, RN on: 10/27/2013  7:39 AM<BR>     Documentation filed: Clinical Notes

## 2013-10-27 NOTE — Telephone Encounter (Signed)
Please advise. Thank you

## 2013-10-27 NOTE — Telephone Encounter (Signed)
Need okay to stop blood thinner 7 days prior to injection for back pain. / tgs

## 2013-10-27 NOTE — Telephone Encounter (Signed)
Called Dr Gabriel Carina and left message.

## 2013-10-27 NOTE — Progress Notes (Signed)
Cardiac Rehabilitation Program Outcomes Report   Orientation:  05/19/2013 Graduate Date:  NA Discharge Date:  1006/2014 # of sessions completed: 7 DX: Stent X 1  Cardiologist: Bronson Ing Family MD:  No PCP Class Time:  08:15  A.  Exercise Program:  Tolerates exercise @ 2.00 METS for 15 minutes, Walk Test Results:  Pre: PRE walk Test: Rest HR 95, BP 120/80, O2 97%, RPE 9 and RPD 9. 6 min HR 89, BP 140/98, O2 100% RPE 13 and RPD 11, Post HR 84, BP 122/88,O2 100% ,RPE 13 and RPD 6. Walked 800 feet at 1.5 mph,at 2.2METS. and Discharged  B.  Mental Health:  Good mental attitude  C.  Education/Instruction/Skills  Accurately checks own pulse.  Rest:  88  Exercise:  120, Knows THR for exercise, Uses Perceived Exertion Scale and/or Dyspnea Scale and Attended 2 education classes  Uses Perceived Exertion Scale and/or Dyspnea Scale  D.  Nutrition/Weight Control/Body Composition:  Adherence to prescribed nutrition program: good    E.  Blood Lipids    Lab Results  Component Value Date   CHOL 161 03/29/2013   HDL 45 03/29/2013   LDLCALC 96 03/29/2013   TRIG 100 03/29/2013   CHOLHDL 3.6 03/29/2013    F.  Lifestyle Changes:  Making positive lifestyle changes and Continues to smoke  G.  Symptoms noted with exercise:  Asymptomatic  Report Completed By:  Oletta Lamas. Velton Roselle RN   Comments:  This is patients discharge note. HE only attended 7 sessions. HE did well while here in rehab. He never returned to finish program. His rest HR was 88 and rest BP was 122/709 and peak HR was 120 and peak Bp was 148/80.

## 2013-10-27 NOTE — Telephone Encounter (Signed)
Stop for 5 days prior to procedure. Restart day of procedure once completed

## 2013-10-27 NOTE — Telephone Encounter (Signed)
Patient states that he is taking Oxycodone the following way and that it seems to be helping: 1 in am, 2 in afternoon, 2 in evening, 1 @ bedtime. We are also getting him set up @ the [facility] for a caudal injection per Dr. Vernard Gambles.

## 2013-10-27 NOTE — Addendum Note (Signed)
Encounter addended by: Norlene Duel, RN on: 10/27/2013  7:38 AM<BR>     Documentation filed: Notes Section

## 2013-10-27 NOTE — Telephone Encounter (Signed)
DR OFFICE IS CALLING AGAIN TO CLARIFY THAT THEY SAID 7 DAYS NOT 5 DAY.

## 2013-10-30 ENCOUNTER — Telehealth (HOSPITAL_COMMUNITY): Payer: Self-pay | Admitting: *Deleted

## 2013-10-30 NOTE — Telephone Encounter (Signed)
Patient states that he has been "itching some kind of furious" since starting the MS Contin tablets. I told him not to take tonight's dose or the morning's dose and to call me back after lunch tomorrow and let me know how his itching is. He said ok. He states that he doesn't think he has a rash and that he will get his wife to check when she gets home. I instructed him to continue taking the Oxycodone as prescribed. He said ok.

## 2013-10-30 NOTE — Telephone Encounter (Signed)
Called dr office, they stated with the policy of the procedure at their office to hold for 7 days. This nurse told them 5 but they want me to clarify again.

## 2013-10-30 NOTE — Telephone Encounter (Signed)
This is probably due to the histamine release activity from cutaneous mast cells seen with opioids.  He will become refractory to it. Suggest Loratidine (generic Claritin) 10mg  daily.  Please let him know.  Thanks. Dr.F  The above is a recorded in-basket conversation between Dr. Barnet Glasgow and myself regarding patient continuing MS Contin despite his complaints of itching. I explained to Bane that he could continue MS Contin but that he needed to add Claritin or generic Claritin to his daily medications. Patient said ok. I asked him to call me back with a report on the itching. I also instructed patient to sit around with itching that wasn't improving. He said ok.

## 2013-10-30 NOTE — Telephone Encounter (Signed)
Called Danielle at Dr office and made her aware.

## 2013-10-30 NOTE — Telephone Encounter (Signed)
Go by the person performing the test policy and procedure. Need to restart ASAP.

## 2013-10-31 DIAGNOSIS — E7800 Pure hypercholesterolemia, unspecified: Secondary | ICD-10-CM | POA: Diagnosis not present

## 2013-10-31 DIAGNOSIS — F17200 Nicotine dependence, unspecified, uncomplicated: Secondary | ICD-10-CM | POA: Diagnosis not present

## 2013-10-31 DIAGNOSIS — I10 Essential (primary) hypertension: Secondary | ICD-10-CM | POA: Diagnosis not present

## 2013-11-07 ENCOUNTER — Ambulatory Visit
Admission: RE | Admit: 2013-11-07 | Discharge: 2013-11-07 | Disposition: A | Discharge status: Home / Self Care | Payer: Medicare Other | Source: Ambulatory Visit | Attending: Hematology and Oncology | Admitting: Hematology and Oncology

## 2013-11-07 DIAGNOSIS — C20 Malignant neoplasm of rectum: Secondary | ICD-10-CM

## 2013-11-07 DIAGNOSIS — M545 Low back pain, unspecified: Secondary | ICD-10-CM | POA: Diagnosis not present

## 2013-11-07 MED ORDER — IOHEXOL 180 MG/ML  SOLN
1.0000 mL | Freq: Once | INTRAMUSCULAR | Status: AC | PRN
  Administered 2013-11-07: 1 mL via EPIDURAL

## 2013-11-07 MED ORDER — METHYLPREDNISOLONE ACETATE 40 MG/ML INJ SUSP (RADIOLOG
120.0000 mg | Freq: Once | INTRAMUSCULAR | Status: AC
  Administered 2013-11-07: 120 mg via EPIDURAL

## 2013-11-07 NOTE — Discharge Instructions (Addendum)
Post Procedure Spinal Discharge Instruction Sheet  1. You may resume a regular diet and any medications that you routinely take (including pain medications).  2. No driving day of procedure.  3. Light activity throughout the rest of the day.  Do not do any strenuous work, exercise, bending or lifting.  The day following the procedure, you can resume normal physical activity but you should refrain from exercising or physical therapy for at least three days thereafter.   Common Side Effects:   Headaches- take your usual medications as directed by your physician.  Increase your fluid intake.  Caffeinated beverages may be helpful.  Lie flat in bed until your headache resolves.   Restlessness or inability to sleep- you may have trouble sleeping for the next few days.  Ask your referring physician if you need any medication for sleep.   Facial flushing or redness- should subside within a few days.   Increased pain- a temporary increase in pain a day or two following your procedure is not unusual.  Take your pain medication as prescribed by your referring physician.   Leg cramps  Please contact our office at [phone] for the following symptoms:  Fever greater than 100 degrees.  Headaches unresolved with medication after 2-3 days.  Increased swelling, pain, or redness at injection site.  Thank you for visiting our office.  You may resume Effient today.

## 2013-11-15 ENCOUNTER — Telehealth (HOSPITAL_COMMUNITY): Payer: Self-pay | Admitting: *Deleted

## 2013-11-15 NOTE — Telephone Encounter (Signed)
Update on patient: Patient is having normal size and caliber BMs but is going so often he is getting raw externally. Patient wants to know if there is something externally he can apply to rectal area for this problem. Since the caudal injection, patient's back pain is better but he states that the pain is worse in his rectal area, LT hip, and both legs. He states that he takes his Morphine tablet between 8am and 9am and between 10 pm and 11 pm. He is taking around 4-6 tablets of oxycodone per day. He says he will live with it but just needs [place] to help him through it. He is scheduled to see Dr. Barnet Glasgow 2/25 @ 8:30.

## 2013-11-15 NOTE — Telephone Encounter (Signed)
Pt notified per Dr. Barnet Glasgow to increase his MS CONTIN to 60mg  q12h which equates to 2 tabs q12h. Patient repeated instructions back to me. I also told him that in doing so, he will need less oxycodone (per Dr. Barnet Glasgow). Also, Dr. Barnet Glasgow recommended Diaperene ointment for his raw bottom. I instructed patient regarding all of this and he said ok.

## 2013-11-22 ENCOUNTER — Encounter (HOSPITAL_COMMUNITY): Payer: Medicare Other | Attending: Hematology and Oncology

## 2013-11-22 ENCOUNTER — Encounter (HOSPITAL_COMMUNITY): Payer: Self-pay

## 2013-11-22 ENCOUNTER — Ambulatory Visit (HOSPITAL_COMMUNITY): Payer: Medicare Other

## 2013-11-22 VITALS — BP 102/62 | HR 61 | Temp 98.0°F | Resp 18 | Wt 184.4 lb

## 2013-11-22 DIAGNOSIS — R102 Pelvic and perineal pain: Secondary | ICD-10-CM

## 2013-11-22 DIAGNOSIS — I10 Essential (primary) hypertension: Secondary | ICD-10-CM | POA: Diagnosis not present

## 2013-11-22 DIAGNOSIS — N529 Male erectile dysfunction, unspecified: Secondary | ICD-10-CM

## 2013-11-22 DIAGNOSIS — N509 Disorder of male genital organs, unspecified: Secondary | ICD-10-CM

## 2013-11-22 DIAGNOSIS — Z09 Encounter for follow-up examination after completed treatment for conditions other than malignant neoplasm: Secondary | ICD-10-CM | POA: Insufficient documentation

## 2013-11-22 DIAGNOSIS — K219 Gastro-esophageal reflux disease without esophagitis: Secondary | ICD-10-CM | POA: Diagnosis not present

## 2013-11-22 DIAGNOSIS — C20 Malignant neoplasm of rectum: Secondary | ICD-10-CM | POA: Diagnosis not present

## 2013-11-22 DIAGNOSIS — Z85048 Personal history of other malignant neoplasm of rectum, rectosigmoid junction, and anus: Secondary | ICD-10-CM | POA: Insufficient documentation

## 2013-11-22 MED ORDER — MORPHINE SULFATE ER 30 MG PO TBCR: EXTENDED_RELEASE_TABLET | ORAL | Status: DC

## 2013-11-22 MED ORDER — OXYCODONE HCL 10 MG PO TABS: ORAL_TABLET | ORAL | Status: DC

## 2013-11-22 NOTE — Patient Instructions (Addendum)
[place] Discharge Instructions  RECOMMENDATIONS MADE BY THE CONSULTANT AND ANY TEST RESULTS WILL BE SENT TO YOUR REFERRING PHYSICIAN.  EXAM FINDINGS BY THE PHYSICIAN TODAY AND SIGNS OR SYMPTOMS TO REPORT TO CLINIC OR PRIMARY PHYSICIAN: Exam and findings as discussed by Dr. Barnet Glasgow.  Labs were good.  Report uncontrolled pain or other problems  MEDICATIONS PRESCRIBED:  Increase MS contin to 90  ( 3 tablets) mg every 12 hours Oxycodone 10 mg - take 1 or 2 every 4 hours as needed for break through pain.  INSTRUCTIONS/FOLLOW-UP: Follow-up in 4 weeks.  Thank you for choosing [facility] to provide your oncology and hematology care.  To afford each patient quality time with our providers, please arrive at least 15 minutes before your scheduled appointment time.  With your help, our goal is to use those 15 minutes to complete the necessary work-up to ensure our physicians have the information they need to help with your evaluation and healthcare recommendations.    Effective January 1st, 2014, we ask that you re-schedule your appointment with our physicians should you arrive 10 or more minutes late for your appointment.  We strive to give you quality time with our providers, and arriving late affects you and other patients whose appointments are after yours.    Again, thank you for choosing [facility].  Our hope is that these requests will decrease the amount of time that you wait before being seen by our physicians.       _____________________________________________________________  Should you have questions after your visit to [facility], please contact our office at [phone] between the hours of 8:30 a.m. and 5:00 p.m.  Voicemails left after 4:30 p.m. will not be returned until the following business day.  For prescription refill requests, have your pharmacy contact our office with your prescription refill request.

## 2013-11-22 NOTE — Progress Notes (Signed)
Juan Wheeler  OFFICE PROGRESS NOTE  Provider Not In System No address on file  DIAGNOSIS: Rectal cancer  Perineal pain in male  Chief Complaint  Patient presents with  . Rectal Cancer  . Perineal pain    CURRENT THERAPY: Lumbar epidural block plus oral oxycodone.  INTERVAL HISTORY: Juan Wheeler 53 y.o. male returns for followup of chronic perineal pain following definitive treatment for carcinoma of the rectum, status post epidural injection on 11/07/2013 and taking oxycodone 10 mg about 3 times a day in addition to MS Contin 60 mg every 12 hours plus gabapentin 900 mg in the morning, 1200 mg in the afternoon, and 1200 mg at bedtime. Pain level is still 5/10. He did get some relief from epidural injection but is having more diarrhea and continues with erectile dysfunction. He denies a fever, night sweats, abdominal distention, melena, hematochezia, hematuria, epistaxis, lower extremity swelling or redness, lower extremity paresthesias, skin rash, headache, or seizures.  MEDICAL HISTORY: Past Medical History  Diagnosis Date  . Hypertension   . DVT (deep venous thrombosis) ~ 2013  . Rectal cancer   . Coronary artery disease     a. 03/2013: abnl nuc -> LHC s/p DES to LCx, residual moderate disease in LAD (med rx unless refractory angina). b. Not on BB due to bradycardia.  Marland Kitchen History of blood transfusion     "once; after throwing up alot of blood" (04/17/2013)  . GERD (gastroesophageal reflux disease)   . Chronic lower back pain     a. Followed by pain management at [facility].  . PAD (peripheral artery disease)     a. Occlusion of the right internal iliac artery, with significant atherosclerosis in the left internal iliac which was not amenable to reconstruction per notes from [facility].  . LV dysfunction     a. EF 45% in 03/2013.  . Tobacco abuse   . Colon cancer     rectal cancer    INTERIM HISTORY: has Chest pain; Hypertension;  Overweight; Tobacco abuse; Atherosclerosis of native arteries of the extremities with intermittent claudication; Backache; Chronic pain syndrome; Chronic pain; Peripheral vascular disease; Constipation; Depressive disorder; Dysuria; Heartburn; Hematoma complicating a procedure; Pain in joint, pelvic region and thigh; Personal history of digestive disease; Essential hypertension; Hyposmolality and/or hyponatremia; Impotence of organic origin; Malignant neoplasm of rectum; Nausea with vomiting; Neuralgia and neuritis; Acute posthemorrhagic anemia; Anal or rectal pain; Compression of vein; Urinary frequency; Unstable angina; and ASCVD (arteriosclerotic cardiovascular disease) on his problem list.   Rectal cancer diagnosed in March of 2012 and treated with preoperative Xeloda plus radiotherapy followed by low anterior resection and loop ileostomy (05/11/2011) and because of compounding factors including severe rectal pain., never received any additional adjuvant therapy for original T3 tumor diagnosed by endoscopic ultrasound. Ileostomy was taken down 3 months after surgery. His primary problem is severe perineal pain radiating up into the lower spine and down both buttocks. Oncologic recap [name])  DIAGNOSIS: Pre-radiation/chemotherapy Clinical Stage IIA rectal adenocarcinoma (uT3N0 clinical M0), ypT2pN0 after neoadjuvant chemotherapy.On initial presentation, his course was complicated or delayed secondary to finding on preoperative imaging showing a tracheal lesion and nasopharynx lesion as well as several subcentimeter pulmonary nodules. ENT evaluation revealed no evidence of neoplastic process in the trachea or nasopharynx and a biopsy of a lung nodule did not show any evidence of malignancy.  2. Concurrent chemo-RT with Xeloda as a radiosensitizer at a dose of 1500  mg p.o. B.i.d. Monday through Friday 02/03/2011 through 03/13/2011.  3. He underwent a LAR with temporary loop ileostomy by [name]  on 05/11/2011. Path showed residual invasive adenocarcinoma invasive into the muscular propria margins negative, ypT2pN0.  4. Initially planned for adjuvant XELOX which we elected to forego as his course was complicated by uncontrolled pain. Has continued on surveillance  ALLERGIES:  is allergic to darvocet.  MEDICATIONS: has a current medication list which includes the following prescription(s): amlodipine, aspirin ec, atorvastatin, gabapentin, isosorbide mononitrate, lisinopril, metoprolol succinate, morphine, nitroglycerin, omeprazole, OVER THE COUNTER MEDICATION, oxycodone hcl, and prasugrel.  SURGICAL HISTORY:  Past Surgical History  Procedure Laterality Date  . Colostomy    . Abdominal surgery  1990's    'for stomach ulcers" (04/17/2013)  . Coronary angioplasty with stent placement  04/17/2013    "?1" (04/17/2013)  . Inguinal hernia repair Bilateral 1990's  . Colostomy takedown  2013  . Colectomy  2012    "for rectal cancer" (04/17/2013)    FAMILY HISTORY: family history includes Cancer in his mother.  SOCIAL HISTORY:  reports that he has been smoking Cigarettes.  He has a 40 pack-year smoking history. He uses smokeless tobacco. He reports that he does not drink alcohol or use illicit drugs.  REVIEW OF SYSTEMS:  Other than that discussed above is noncontributory.  PHYSICAL EXAMINATION: ECOG PERFORMANCE STATUS: 1 - Symptomatic but completely ambulatory  Blood pressure 102/62, pulse 61, temperature 98 F (36.7 C), temperature source Oral, resp. rate 18, weight 184 lb 6.4 oz (83.643 kg).  GENERAL:alert, no distress and comfortable SKIN: skin color, texture, turgor are normal, no rashes or significant lesions EYES: PERLA; Conjunctiva are pink and non-injected, sclera clear OROPHARYNX:no exudate, no erythema on lips, buccal mucosa, or tongue. NECK: supple, thyroid normal size, non-tender, without nodularity. No masses CHEST: Increased AP diameter with no gynecomastia. LYMPH:  no  palpable lymphadenopathy in the cervical, axillary or inguinal LUNGS: clear to auscultation and percussion with normal breathing effort HEART: regular rate & rhythm and no murmurs. ABDOMEN:abdomen soft, non-tender and normal bowel sounds. Well-healed surgical scars with no hepatosplenomegaly, ascites, or CVA tenderness. MUSCULOSKELETAL:no cyanosis of digits and no clubbing. Range of motion normal.  NEURO: alert & oriented x 3 with fluent speech, no focal motor/sensory deficits. DTRs normal.   LABORATORY DATA: Office Visit on 10/25/2013  Component Date Value Ref Range Status  . WBC 10/25/2013 6.3  4.0 - 10.5 K/uL Final  . RBC 10/25/2013 5.15  4.22 - 5.81 MIL/uL Final  . Hemoglobin 10/25/2013 16.2  13.0 - 17.0 g/dL Final  . HCT 10/25/2013 48.3  39.0 - 52.0 % Final  . MCV 10/25/2013 93.8  78.0 - 100.0 fL Final  . MCH 10/25/2013 31.5  26.0 - 34.0 pg Final  . MCHC 10/25/2013 33.5  30.0 - 36.0 g/dL Final  . RDW 10/25/2013 13.7  11.5 - 15.5 % Final  . Platelets 10/25/2013 211  150 - 400 K/uL Final  . Neutrophils Relative % 10/25/2013 56  43 - 77 % Final  . Neutro Abs 10/25/2013 3.5  1.7 - 7.7 K/uL Final  . Lymphocytes Relative 10/25/2013 32  12 - 46 % Final  . Lymphs Abs 10/25/2013 2.0  0.7 - 4.0 K/uL Final  . Monocytes Relative 10/25/2013 9  3 - 12 % Final  . Monocytes Absolute 10/25/2013 0.6  0.1 - 1.0 K/uL Final  . Eosinophils Relative 10/25/2013 2  0 - 5 % Final  . Eosinophils Absolute 10/25/2013 0.2  0.0 -  0.7 K/uL Final  . Basophils Relative 10/25/2013 0  0 - 1 % Final  . Basophils Absolute 10/25/2013 0.0  0.0 - 0.1 K/uL Final  . Sodium 10/25/2013 142  137 - 147 mEq/L Final  . Potassium 10/25/2013 4.3  3.7 - 5.3 mEq/L Final  . Chloride 10/25/2013 106  96 - 112 mEq/L Final  . CO2 10/25/2013 23  19 - 32 mEq/L Final  . Glucose, Bld 10/25/2013 90  70 - 99 mg/dL Final  . BUN 10/25/2013 15  6 - 23 mg/dL Final  . Creatinine, Ser 10/25/2013 1.20  0.50 - 1.35 mg/dL Final  . Calcium  10/25/2013 9.2  8.4 - 10.5 mg/dL Final  . Total Protein 10/25/2013 8.1  6.0 - 8.3 g/dL Final  . Albumin 10/25/2013 3.6  3.5 - 5.2 g/dL Final  . AST 10/25/2013 20  0 - 37 U/L Final  . ALT 10/25/2013 18  0 - 53 U/L Final  . Alkaline Phosphatase 10/25/2013 77  39 - 117 U/L Final  . Total Bilirubin 10/25/2013 0.4  0.3 - 1.2 mg/dL Final  . GFR calc non Af Amer 10/25/2013 67* >90 mL/min Final  . GFR calc Af Amer 10/25/2013 78* >90 mL/min Final   Comment: (NOTE)                          The eGFR has been calculated using the CKD EPI equation.                          This calculation has not been validated in all clinical situations.                          eGFR's persistently <90 mL/min signify possible Chronic Kidney                          Disease.  Marland Kitchen LDH 10/25/2013 273* 94 - 250 U/L Final   SLIGHT HEMOLYSIS  . CEA 10/25/2013 1.4  0.0 - 5.0 ng/mL Final   Performed at [facility]  . Ferritin 10/25/2013 74  22 - 322 ng/mL Final   Performed at [facility]: No new pathology.  Urinalysis    Component Value Date/Time   COLORURINE YELLOW 03/29/2013 0840   APPEARANCEUR CLEAR 03/29/2013 0840   LABSPEC 1.015 03/29/2013 0840   PHURINE 6.0 03/29/2013 0840   GLUCOSEU NEGATIVE 03/29/2013 0840   HGBUR NEGATIVE 03/29/2013 0840   BILIRUBINUR NEGATIVE 03/29/2013 0840   KETONESUR NEGATIVE 03/29/2013 0840   PROTEINUR NEGATIVE 03/29/2013 0840   UROBILINOGEN 1.0 03/29/2013 0840   NITRITE NEGATIVE 03/29/2013 0840   LEUKOCYTESUR NEGATIVE 03/29/2013 0840    RADIOGRAPHIC STUDIES: Dg Epidurography  11/07/2013   CLINICAL DATA:  Sacral pain. Bilateral lower extremity radiation. Rectal cancer. History of radiation and chemotherapy. Outside CT chest abdomen and pelvis not available for direct review, but report from October 2014 stated no recurrent disease.  EXAM: CAUDAL EPIDURAL INJECTION  Utilizing a caudal approach, the skin overlying the sacral hiatus was cleansed and anesthetized. A 20 gauge  Crawford epidural needle was advanced into the sacral epidural space. Injection of Omnipaque 180 shows a good epidural pattern with spread up to L5-S1. No vascular opacification is seen.  120 mg of Depo-Medrol mixed with 3 ml of normal saline and 3 ml of 1% Lidocaine were instilled.  The procedure was well-tolerated, and the patient was discharged thirty minutes following the injection in good condition.  FLUOROSCOPY TIME:  22 seconds  IMPRESSION: Technically successful caudal epidural injection #1.   Electronically Signed   By: Rolla Flatten M.D.   On: 11/07/2013 09:06    ASSESSMENT:  #1. Continue perineal pain. #2. Pathologic stage I rectal cancer, status post preoperative Xeloda plus radiotherapy for clinical T3 disease, status post LAR with loop ileostomy performed in August 2012, status post closure of ileostomy with residual severe sacral and perineal pain but no evidence of disease. #3. Hypertension, controlled. #4. Gastroesophageal reflux disease, on treatment.   PLAN:  #1. Increase MS Contin to 90 mg every 12 hours and continue on oxycodone 10-20 mg every 4 hours for breakthrough pain along with gabapentin as ordered. #2. Followup in one month. No labs at that time.   All questions were answered. The patient knows to call the clinic with any problems, questions or concerns. We can certainly see the patient much sooner if necessary.   I spent 25 minutes counseling the patient face to face. The total time spent in the appointment was 30 minutes.    Doroteo Bradford, MD 11/22/2013 9:55 AM

## 2013-12-20 ENCOUNTER — Other Ambulatory Visit: Payer: Self-pay | Admitting: Adult Health

## 2013-12-20 ENCOUNTER — Encounter (HOSPITAL_BASED_OUTPATIENT_CLINIC_OR_DEPARTMENT_OTHER): Payer: Medicare Other

## 2013-12-20 ENCOUNTER — Encounter (HOSPITAL_COMMUNITY): Payer: Self-pay

## 2013-12-20 ENCOUNTER — Other Ambulatory Visit (HOSPITAL_COMMUNITY): Payer: Self-pay | Admitting: Physician Assistant

## 2013-12-20 ENCOUNTER — Encounter (HOSPITAL_COMMUNITY): Payer: Medicare Other | Attending: Hematology and Oncology

## 2013-12-20 VITALS — BP 109/62 | HR 88 | Temp 97.6°F | Resp 18 | Wt 178.5 lb

## 2013-12-20 DIAGNOSIS — C20 Malignant neoplasm of rectum: Secondary | ICD-10-CM | POA: Diagnosis not present

## 2013-12-20 DIAGNOSIS — N509 Disorder of male genital organs, unspecified: Secondary | ICD-10-CM | POA: Diagnosis not present

## 2013-12-20 DIAGNOSIS — R102 Pelvic and perineal pain: Secondary | ICD-10-CM

## 2013-12-20 LAB — CBC WITH DIFFERENTIAL/PLATELET
Basophils Absolute: 0 10*3/uL (ref 0.0–0.1)
Basophils Relative: 0 % (ref 0–1)
Eosinophils Absolute: 0.1 10*3/uL (ref 0.0–0.7)
Eosinophils Relative: 1 % (ref 0–5)
HCT: 49.5 % (ref 39.0–52.0)
Hemoglobin: 17 g/dL (ref 13.0–17.0)
Lymphocytes Relative: 23 % (ref 12–46)
Lymphs Abs: 1.8 10*3/uL (ref 0.7–4.0)
MCH: 33 pg (ref 26.0–34.0)
MCHC: 34.3 g/dL (ref 30.0–36.0)
MCV: 96.1 fL (ref 78.0–100.0)
Monocytes Absolute: 0.4 10*3/uL (ref 0.1–1.0)
Monocytes Relative: 6 % (ref 3–12)
Neutro Abs: 5.5 10*3/uL (ref 1.7–7.7)
Neutrophils Relative %: 70 % (ref 43–77)
Platelets: 239 10*3/uL (ref 150–400)
RBC: 5.15 MIL/uL (ref 4.22–5.81)
RDW: 14.1 % (ref 11.5–15.5)
WBC: 7.9 10*3/uL (ref 4.0–10.5)

## 2013-12-20 LAB — COMPREHENSIVE METABOLIC PANEL
ALT: 13 U/L (ref 0–53)
AST: 17 U/L (ref 0–37)
Albumin: 3.7 g/dL (ref 3.5–5.2)
Alkaline Phosphatase: 79 U/L (ref 39–117)
BUN: 14 mg/dL (ref 6–23)
CO2: 28 mEq/L (ref 19–32)
Calcium: 9.7 mg/dL (ref 8.4–10.5)
Chloride: 107 mEq/L (ref 96–112)
Creatinine, Ser: 1.2 mg/dL (ref 0.50–1.35)
GFR calc Af Amer: 78 mL/min — ABNORMAL LOW (ref >90)
GFR calc non Af Amer: 67 mL/min — ABNORMAL LOW (ref >90)
Glucose, Bld: 113 mg/dL — ABNORMAL HIGH (ref 70–99)
Potassium: 4.7 mEq/L (ref 3.7–5.3)
Sodium: 145 mEq/L (ref 137–147)
Total Bilirubin: 0.7 mg/dL (ref 0.3–1.2)
Total Protein: 8.6 g/dL — ABNORMAL HIGH (ref 6.0–8.3)

## 2013-12-20 MED ORDER — MORPHINE SULFATE ER 60 MG PO TBCR: EXTENDED_RELEASE_TABLET | ORAL | Status: DC

## 2013-12-20 MED ORDER — OXYCODONE HCL 10 MG PO TABS: ORAL_TABLET | ORAL | Status: DC

## 2013-12-20 NOTE — Progress Notes (Signed)
Labs drawn today for cbc/diff,cmp,cea

## 2013-12-20 NOTE — Progress Notes (Signed)
[facility]  OFFICE PROGRESS NOTE  PROVIDER NOT IN SYSTEM No address on file  DIAGNOSIS: Rectal cancer - Plan: CBC with Differential, Comprehensive metabolic panel, CEA  Perineal pain in male  Chief Complaint  Patient presents with  . History of rectal cancer  . Persistent perineal pain    CURRENT THERAPY: MS Contin and oxycodone for chronic persistent perineal pain.  INTERVAL HISTORY: Juan Wheeler 53 y.o. male returns for followup of chronic perineal pain following definitive treatment for carcinoma of the rectum, status post lumbar epidural injection on 11/07/2013 currently taking MS Contin, gabapentin, and oxycodone.  Pain level is 5/10. He is somewhat drowsy when he takes 90 mg of MS Contin at a time. He denies any melena, hematochezia, cough, wheezing, PND, orthopnea, or worsening back pain. He denies any PND, orthopnea, palpitations, headache, or seizures. Bowel movements are controlled by the use of stool softeners and intermittent enemas.  MEDICAL HISTORY: Past Medical History  Diagnosis Date  . Hypertension   . DVT (deep venous thrombosis) ~ 2013  . Rectal cancer   . Coronary artery disease     a. 03/2013: abnl nuc -> LHC s/p DES to LCx, residual moderate disease in LAD (med rx unless refractory angina). b. Not on BB due to bradycardia.  Marland Kitchen History of blood transfusion     "once; after throwing up alot of blood" (04/17/2013)  . GERD (gastroesophageal reflux disease)   . Chronic lower back pain     a. Followed by pain management at [facility].  . PAD (peripheral artery disease)     a. Occlusion of the right internal iliac artery, with significant atherosclerosis in the left internal iliac which was not amenable to reconstruction per notes from [facility].  . LV dysfunction     a. EF 45% in 03/2013.  . Tobacco abuse   . Colon cancer     rectal cancer    INTERIM HISTORY: has Chest pain; Hypertension; Overweight; Tobacco  abuse; Atherosclerosis of native arteries of the extremities with intermittent claudication; Backache; Chronic pain syndrome; Chronic pain; Peripheral vascular disease; Constipation; Depressive disorder; Dysuria; Heartburn; Hematoma complicating a procedure; Pain in joint, pelvic region and thigh; Personal history of digestive disease; Essential hypertension; Hyposmolality and/or hyponatremia; Impotence of organic origin; Malignant neoplasm of rectum; Nausea with vomiting; Neuralgia and neuritis; Acute posthemorrhagic anemia; Anal or rectal pain; Compression of vein; Urinary frequency; Unstable angina; and ASCVD (arteriosclerotic cardiovascular disease) on his problem list.    ALLERGIES:  is allergic to darvocet.  MEDICATIONS: has a current medication list which includes the following prescription(s): aspirin ec, atorvastatin, gabapentin, lisinopril, metoprolol succinate, morphine, nitroglycerin, omeprazole, OVER THE COUNTER MEDICATION, oxycodone hcl, prasugrel, amlodipine, and isosorbide mononitrate.  SURGICAL HISTORY:  Past Surgical History  Procedure Laterality Date  . Colostomy    . Abdominal surgery  1990's    'for stomach ulcers" (04/17/2013)  . Coronary angioplasty with stent placement  04/17/2013    "?1" (04/17/2013)  . Inguinal hernia repair Bilateral 1990's  . Colostomy takedown  2013  . Colectomy  2012    "for rectal cancer" (04/17/2013)    FAMILY HISTORY: family history includes Cancer in his mother.  SOCIAL HISTORY:  reports that he has been smoking Cigarettes.  He has a 40 pack-year smoking history. He uses smokeless tobacco. He reports that he does not drink alcohol or use illicit drugs.  REVIEW OF SYSTEMS:  Other than that discussed above is  noncontributory.  PHYSICAL EXAMINATION: ECOG PERFORMANCE STATUS: 1 - Symptomatic but completely ambulatory  Blood pressure 109/62, pulse 88, temperature 97.6 F (36.4 C), temperature source Oral, resp. rate 18, weight 178 lb 8 oz (80.967  kg), SpO2 99.00%.  GENERAL:alert, no distress and comfortable SKIN: skin color, texture, turgor are normal, no rashes or significant lesions EYES: PERLA; Conjunctiva are pink and non-injected, sclera clear SINUSES: No redness or tenderness over maxillary or ethmoid sinuses OROPHARYNX:no exudate, no erythema on lips, buccal mucosa, or tongue. NECK: supple, thyroid normal size, non-tender, without nodularity. No masses CHEST: Normal AP diameter with no breast masses. LYMPH:  no palpable lymphadenopathy in the cervical, axillary or inguinal LUNGS: clear to auscultation and percussion with normal breathing effort HEART: regular rate & rhythm and no murmurs. ABDOMEN:abdomen soft, non-tender and normal bowel sounds. Well-healed surgical scars with no hepatosplenomegaly, ascites, or CVA tenderness.  MUSCULOSKELETAL:no cyanosis of digits and no clubbing. Range of motion normal. Tenderness over the lumbar spine. NEURO: alert & oriented x 3 with fluent speech, no focal motor/sensory deficits   LABORATORY DATA: Infusion on 12/20/2013  Component Date Value Ref Range Status  . WBC 12/20/2013 7.9  4.0 - 10.5 K/uL Final  . RBC 12/20/2013 5.15  4.22 - 5.81 MIL/uL Final  . Hemoglobin 12/20/2013 17.0  13.0 - 17.0 g/dL Final  . HCT 12/20/2013 49.5  39.0 - 52.0 % Final  . MCV 12/20/2013 96.1  78.0 - 100.0 fL Final  . MCH 12/20/2013 33.0  26.0 - 34.0 pg Final  . MCHC 12/20/2013 34.3  30.0 - 36.0 g/dL Final  . RDW 12/20/2013 14.1  11.5 - 15.5 % Final  . Platelets 12/20/2013 239  150 - 400 K/uL Final  . Neutrophils Relative % 12/20/2013 70  43 - 77 % Final  . Neutro Abs 12/20/2013 5.5  1.7 - 7.7 K/uL Final  . Lymphocytes Relative 12/20/2013 23  12 - 46 % Final  . Lymphs Abs 12/20/2013 1.8  0.7 - 4.0 K/uL Final  . Monocytes Relative 12/20/2013 6  3 - 12 % Final  . Monocytes Absolute 12/20/2013 0.4  0.1 - 1.0 K/uL Final  . Eosinophils Relative 12/20/2013 1  0 - 5 % Final  . Eosinophils Absolute 12/20/2013  0.1  0.0 - 0.7 K/uL Final  . Basophils Relative 12/20/2013 0  0 - 1 % Final  . Basophils Absolute 12/20/2013 0.0  0.0 - 0.1 K/uL Final  . Sodium 12/20/2013 145  137 - 147 mEq/L Final  . Potassium 12/20/2013 4.7  3.7 - 5.3 mEq/L Final  . Chloride 12/20/2013 107  96 - 112 mEq/L Final  . CO2 12/20/2013 28  19 - 32 mEq/L Final  . Glucose, Bld 12/20/2013 113* 70 - 99 mg/dL Final  . BUN 12/20/2013 14  6 - 23 mg/dL Final  . Creatinine, Ser 12/20/2013 1.20  0.50 - 1.35 mg/dL Final  . Calcium 12/20/2013 9.7  8.4 - 10.5 mg/dL Final  . Total Protein 12/20/2013 8.6* 6.0 - 8.3 g/dL Final  . Albumin 12/20/2013 3.7  3.5 - 5.2 g/dL Final  . AST 12/20/2013 17  0 - 37 U/L Final  . ALT 12/20/2013 13  0 - 53 U/L Final  . Alkaline Phosphatase 12/20/2013 79  39 - 117 U/L Final  . Total Bilirubin 12/20/2013 0.7  0.3 - 1.2 mg/dL Final  . GFR calc non Af Amer 12/20/2013 67* >90 mL/min Final  . GFR calc Af Amer 12/20/2013 78* >90 mL/min Final   Comment: (NOTE)  The eGFR has been calculated using the CKD EPI equation.                          This calculation has not been validated in all clinical situations.                          eGFR's persistently <90 mL/min signify possible Chronic Kidney                          Disease.    PATHOLOGY: No new pathology.  Urinalysis    Component Value Date/Time   COLORURINE YELLOW 03/29/2013 0840   APPEARANCEUR CLEAR 03/29/2013 0840   LABSPEC 1.015 03/29/2013 0840   PHURINE 6.0 03/29/2013 0840   GLUCOSEU NEGATIVE 03/29/2013 0840   HGBUR NEGATIVE 03/29/2013 0840   BILIRUBINUR NEGATIVE 03/29/2013 0840   KETONESUR NEGATIVE 03/29/2013 0840   PROTEINUR NEGATIVE 03/29/2013 0840   UROBILINOGEN 1.0 03/29/2013 0840   NITRITE NEGATIVE 03/29/2013 0840   LEUKOCYTESUR NEGATIVE 03/29/2013 0840    RADIOGRAPHIC STUDIES: No results found.  ASSESSMENT:  #1. Continue perineal pain.  #2. Pathologic stage I rectal cancer, status post preoperative Xeloda plus radiotherapy for  clinical T3 disease, status post LAR with loop ileostomy performed in August 2012, status post closure of ileostomy with residual severe sacral and perineal pain but no evidence of disease.  #3. Hypertension, controlled.  #4. Gastroesophageal reflux disease, on treatment   PLAN:  #1. Change MS Contin to 60 mg every 8 hours. Oxycodone 10 mg to 20 mg up to every 4 hours as needed for pain along with gabapentin 900 mg in the morning, 1200 mg in the afternoon, and 1200 mg at bedtime. #2. Followup in one month with no labs at that time. He was told to call should back pain become worse so that additional epidural injection could be delivered.   All questions were answered. The patient knows to call the clinic with any problems, questions or concerns. We can certainly see the patient much sooner if necessary.   I spent 25 minutes counseling the patient face to face. The total time spent in the appointment was 30 minutes.    Doroteo Bradford, MD 12/20/2013 1:09 PM

## 2013-12-20 NOTE — Addendum Note (Signed)
Addended byLouis Meckel on: 12/20/2013 12:10 PM   Modules accepted: Orders

## 2013-12-20 NOTE — Patient Instructions (Signed)
[facility] Discharge Instructions  RECOMMENDATIONS MADE BY THE CONSULTANT AND ANY TEST RESULTS WILL BE SENT TO YOUR REFERRING PHYSICIAN. We will see you in 1 month. We are changing your MS Contin to 60mg  every 8 hours and you will have the oxycodone for break through pain.   Thank you for choosing [facility] to provide your oncology and hematology care.  To afford each patient quality time with our providers, please arrive at least 15 minutes before your scheduled appointment time.  With your help, our goal is to use those 15 minutes to complete the necessary work-up to ensure our physicians have the information they need to help with your evaluation and healthcare recommendations.    Effective January 1st, 2014, we ask that you re-schedule your appointment with our physicians should you arrive 10 or more minutes late for your appointment.  We strive to give you quality time with our providers, and arriving late affects you and other patients whose appointments are after yours.    Again, thank you for choosing [facility].  Our hope is that these requests will decrease the amount of time that you wait before being seen by our physicians.       _____________________________________________________________  Should you have questions after your visit to [facility], please contact our office at [phone] between the hours of 8:30 a.m. and 5:00 p.m.  Voicemails left after 4:30 p.m. will not be returned until the following business day.  For prescription refill requests, have your pharmacy contact our office with your prescription refill request.

## 2013-12-21 LAB — CEA: CEA: 1.8 ng/mL (ref 0.0–5.0)

## 2014-01-18 NOTE — Progress Notes (Signed)
PROVIDER NOT IN SYSTEM No address on file  Malignant neoplasm of rectum - Plan: Docusate Calcium (STOOL SOFTENER PO), Loperamide HCl (IMODIUM PO), Oxycodone HCl 10 MG TABS, morphine (MS CONTIN) 60 MG 12 hr tablet  CURRENT THERAPY:MS Contin and oxycodone for chronic persistent perineal pain.   INTERVAL HISTORY: Juan Wheeler 53 y.o. male returns for  regular  visit for followup of chronic perineal pain following definitive treatment for carcinoma of the rectum, status post lumbar epidural injection on 11/07/2013 currently taking MS Contin, gabapentin, and oxycodone.     Malignant neoplasm of rectum   12/02/2010 Initial Diagnosis Malignant neoplasm of rectum   02/03/2011 - 03/13/2011 Chemotherapy Xeloda 1500mg  p.o. BID with Radiotherapy   05/11/2011 Surgery  LAR with temporal loop ileostomy, Dr.Waters, [facility], pT2N0   He reports that his pain is well controlled on the current pain regimen.  He notes difficulty with sleeping.  I recommended PO benadryl 25-50 mg at HS.  If ineffective, I recommended he follow-up with his PCP, Dr. [name].  He otherwise denies any complaints and oncology ROS questioning is negative.    Past Medical History  Diagnosis Date  . Hypertension   . DVT (deep venous thrombosis) ~ 2013  . Rectal cancer   . Coronary artery disease     a. 03/2013: abnl nuc -> LHC s/p DES to LCx, residual moderate disease in LAD (med rx unless refractory angina). b. Not on BB due to bradycardia.  Marland Kitchen History of blood transfusion     "once; after throwing up alot of blood" (04/17/2013)  . GERD (gastroesophageal reflux disease)   . Chronic lower back pain     a. Followed by pain management at [facility].  . PAD (peripheral artery disease)     a. Occlusion of the right internal iliac artery, with significant atherosclerosis in the left internal iliac which was not amenable to reconstruction per notes from [facility].  . LV dysfunction     a. EF 45% in 03/2013.  . Tobacco abuse   .  Colon cancer     rectal cancer    has Chest pain; Hypertension; Overweight; Tobacco abuse; Atherosclerosis of native arteries of the extremities with intermittent claudication; Backache; Chronic pain syndrome; Chronic pain; Peripheral vascular disease; Constipation; Depressive disorder; Dysuria; Heartburn; Hematoma complicating a procedure; Pain in joint, pelvic region and thigh; Personal history of digestive disease; Essential hypertension; Hyposmolality and/or hyponatremia; Impotence of organic origin; Malignant neoplasm of rectum; Nausea with vomiting; Neuralgia and neuritis; Acute posthemorrhagic anemia; Anal or rectal pain; Compression of vein; Urinary frequency; Unstable angina; and ASCVD (arteriosclerotic cardiovascular disease) on his problem list.     is allergic to darvocet.  Mr. Korver had no medications administered during this visit.  Past Surgical History  Procedure Laterality Date  . Colostomy    . Abdominal surgery  1990's    'for stomach ulcers" (04/17/2013)  . Coronary angioplasty with stent placement  04/17/2013    "?1" (04/17/2013)  . Inguinal hernia repair Bilateral 1990's  . Colostomy takedown  2013  . Colectomy  2012    "for rectal cancer" (04/17/2013)    Denies any headaches, dizziness, double vision, fevers, chills, night sweats, nausea, vomiting, diarrhea, constipation, chest pain, heart palpitations, shortness of breath, blood in stool, black tarry stool, urinary pain, urinary burning, urinary frequency, hematuria.   PHYSICAL EXAMINATION  ECOG PERFORMANCE STATUS: 1 - Symptomatic but completely ambulatory  Filed Vitals:   01/19/14 1337  BP: 135/76  Pulse: 69  Temp: 98 F (36.7 C)  Resp: 18    GENERAL:alert, no distress, well nourished, well developed, comfortable, cooperative and smiling SKIN: skin color, texture, turgor are normal, no rashes or significant lesions HEAD: Normocephalic, No masses, lesions, tenderness or abnormalities EYES: normal,  PERRLA, EOMI, Conjunctiva are pink and non-injected EARS: External ears normal OROPHARYNX:mucous membranes are moist  NECK: supple, no adenopathy, trachea midline LYMPH:  no palpable lymphadenopathy BREAST:not examined LUNGS: clear to auscultation  HEART: regular rate & rhythm, no murmurs and no gallops ABDOMEN:abdomen soft and normal bowel sounds BACK: Back symmetric, no curvature. EXTREMITIES:less then 2 second capillary refill, no joint deformities, effusion, or inflammation, no skin discoloration, no cyanosis  NEURO: alert & oriented x 3 with fluent speech, no focal motor/sensory deficits, gait normal   LABORATORY DATA: CBC    Component Value Date/Time   WBC 7.9 12/20/2013 1157   RBC 5.15 12/20/2013 1157   HGB 17.0 12/20/2013 1157   HCT 49.5 12/20/2013 1157   PLT 239 12/20/2013 1157   MCV 96.1 12/20/2013 1157   MCH 33.0 12/20/2013 1157   MCHC 34.3 12/20/2013 1157   RDW 14.1 12/20/2013 1157   LYMPHSABS 1.8 12/20/2013 1157   MONOABS 0.4 12/20/2013 1157   EOSABS 0.1 12/20/2013 1157   BASOSABS 0.0 12/20/2013 1157      Chemistry      Component Value Date/Time   NA 145 12/20/2013 1157   K 4.7 12/20/2013 1157   CL 107 12/20/2013 1157   CO2 28 12/20/2013 1157   BUN 14 12/20/2013 1157   CREATININE 1.20 12/20/2013 1157   CREATININE 1.10 04/14/2013 1617      Component Value Date/Time   CALCIUM 9.7 12/20/2013 1157   ALKPHOS 79 12/20/2013 1157   AST 17 12/20/2013 1157   ALT 13 12/20/2013 1157   BILITOT 0.7 12/20/2013 1157       ASSESSMENT:  1. Continue perineal pain. Well controlled on current pain regimen: MS contin 60 mg every 8 hours and Oxycodone 10-20 mg every 4 hours for breakthrough pain. 2. Pathologic stage I rectal cancer, status post preoperative Xeloda plus radiotherapy for clinical T3 disease, status post LAR with loop ileostomy performed in August 2012, status post closure of ileostomy with residual severe sacral and perineal pain but no evidence of disease.  3. Hypertension,  controlled.  4. Gastroesophageal reflux disease, on treatment   PLAN:  1. I personally reviewed and went over laboratory results with the patient.  The results are noted within this dictation. 2. I personally reviewed and went over radiographic studies with the patient.  The results are noted within this dictation.   3. Refill on Ms Contin 4. Refill on Oxycodone 5. Recommended OTC Benadryl 25-50 mg PO at HS for insomnia.  If ineffective, he is to follow-up with his PCP regarding this symptom. 6. Return in 1 month for follow-up.    THERAPY PLAN:  We will continue to manage his pain.  He is well controlled on the current regimen.  All questions were answered. The patient knows to call the clinic with any problems, questions or concerns. We can certainly see the patient much sooner if necessary.  Patient and plan discussed with Dr. Farrel Gobble and he is in agreement with the aforementioned.   [name] 01/19/2014

## 2014-01-19 ENCOUNTER — Encounter (HOSPITAL_COMMUNITY): Payer: Medicare Other | Attending: Hematology and Oncology | Admitting: Oncology

## 2014-01-19 ENCOUNTER — Encounter (HOSPITAL_COMMUNITY): Payer: Self-pay | Admitting: Oncology

## 2014-01-19 VITALS — BP 135/76 | HR 69 | Temp 98.0°F | Resp 18 | Wt 176.4 lb

## 2014-01-19 DIAGNOSIS — C20 Malignant neoplasm of rectum: Secondary | ICD-10-CM

## 2014-01-19 DIAGNOSIS — N509 Disorder of male genital organs, unspecified: Secondary | ICD-10-CM | POA: Diagnosis not present

## 2014-01-19 MED ORDER — OXYCODONE HCL 10 MG PO TABS: ORAL_TABLET | ORAL | Status: DC

## 2014-01-19 MED ORDER — MORPHINE SULFATE ER 60 MG PO TBCR: EXTENDED_RELEASE_TABLET | ORAL | Status: DC

## 2014-01-19 NOTE — Patient Instructions (Signed)
[facility] Discharge Instructions  RECOMMENDATIONS MADE BY THE CONSULTANT AND ANY TEST RESULTS WILL BE SENT TO YOUR REFERRING PHYSICIAN.  EXAM FINDINGS BY THE PHYSICIAN TODAY AND SIGNS OR SYMPTOMS TO REPORT TO CLINIC OR PRIMARY PHYSICIAN: Exam and findings as discussed by Robynn Pane, [name].  Report changes in bowel habits, blood in your bowel movement or other problems.   MEDICATIONS PRESCRIBED:  Refills for MS contin and oxycodone Benadryl 25 mg can take 1 or 2 at bedtime to help with sleep.  If doesn't help contact Dr. [name].  INSTRUCTIONS/FOLLOW-UP: Follow-up in 4 weeks.  Thank you for choosing [facility] to provide your oncology and hematology care.  To afford each patient quality time with our providers, please arrive at least 15 minutes before your scheduled appointment time.  With your help, our goal is to use those 15 minutes to complete the necessary work-up to ensure our physicians have the information they need to help with your evaluation and healthcare recommendations.    Effective January 1st, 2014, we ask that you re-schedule your appointment with our physicians should you arrive 10 or more minutes late for your appointment.  We strive to give you quality time with our providers, and arriving late affects you and other patients whose appointments are after yours.    Again, thank you for choosing [facility].  Our hope is that these requests will decrease the amount of time that you wait before being seen by our physicians.       _____________________________________________________________  Should you have questions after your visit to [facility], please contact our office at [phone] between the hours of 8:30 a.m. and 5:00 p.m.  Voicemails left after 4:30 p.m. will not be returned until the following business day.  For prescription refill requests, have your pharmacy contact our office with your  prescription refill request.

## 2014-01-30 DIAGNOSIS — I10 Essential (primary) hypertension: Secondary | ICD-10-CM | POA: Diagnosis not present

## 2014-01-30 DIAGNOSIS — D012 Carcinoma in situ of rectum: Secondary | ICD-10-CM | POA: Diagnosis not present

## 2014-01-30 DIAGNOSIS — I2510 Atherosclerotic heart disease of native coronary artery without angina pectoris: Secondary | ICD-10-CM | POA: Diagnosis not present

## 2014-02-13 NOTE — Progress Notes (Signed)
PROVIDER NOT IN SYSTEM No address on file  Malignant neoplasm of rectum - Plan: Oxycodone HCl 10 MG TABS, morphine (MS CONTIN) 60 MG 12 hr tablet  Chronic pain syndrome - Plan: Oxycodone HCl 10 MG TABS, morphine (MS CONTIN) 60 MG 12 hr tablet  CURRENT THERAPY: MS Contin and oxycodone for chronic persistent perineal pain.  INTERVAL HISTORY: Juan Wheeler 53 y.o. male returns for  regular  visit for followup of chronic perineal pain following definitive treatment for carcinoma of the rectum, status post lumbar epidural injection on 11/07/2013 currently taking MS Contin, gabapentin, and oxycodone.     Malignant neoplasm of rectum   12/02/2010 Initial Diagnosis Malignant neoplasm of rectum   02/03/2011 - 03/13/2011 Chemotherapy Xeloda 1500mg  p.o. BID with Radiotherapy   05/11/2011 Surgery  LAR with temporal loop ileostomy, Dr.Waters, [place], pT2N0    He relays a story that he had some house work performed and instead of hiring a company to due to [employer] work, he got people from the streets to do it to save him some money.  Unfortunately, he believes one of those workers stole his Oxycodone and as a result, he has been out for 8-10 days.  He has been continuing his MS Contin however.  He is unable to identify the person who stole the medications.  He is having some issues with constipation for which he takes a stool softener.  I have encouraged him to add MOM or MiraLax to his bowel regimen as opioids will cause constipation.  He is given liberty to adjust doses accordingly.   Oncologically, he denies any complaints and ROS questioning is negative.     Past Medical History  Diagnosis Date  . Hypertension   . DVT (deep venous thrombosis) ~ 2013  . Rectal cancer   . Coronary artery disease     a. 03/2013: abnl nuc -> LHC s/p DES to LCx, residual moderate disease in LAD (med rx unless refractory angina). b. Not on BB due to bradycardia.  Marland Kitchen History of blood transfusion     "once;  after throwing up alot of blood" (04/17/2013)  . GERD (gastroesophageal reflux disease)   . Chronic lower back pain     a. Followed by pain management at [facility].  . PAD (peripheral artery disease)     a. Occlusion of the right internal iliac artery, with significant atherosclerosis in the left internal iliac which was not amenable to reconstruction per notes from [facility].  . LV dysfunction     a. EF 45% in 03/2013.  . Tobacco abuse   . Colon cancer     rectal cancer    has Chest pain; Hypertension; Overweight; Tobacco abuse; Atherosclerosis of native arteries of the extremities with intermittent claudication; Backache; Chronic pain syndrome; Chronic pain; Peripheral vascular disease; Constipation; Depressive disorder; Dysuria; Heartburn; Hematoma complicating a procedure; Pain in joint, pelvic region and thigh; Personal history of digestive disease; Essential hypertension; Hyposmolality and/or hyponatremia; Impotence of organic origin; Malignant neoplasm of rectum; Nausea with vomiting; Neuralgia and neuritis; Acute posthemorrhagic anemia; Anal or rectal pain; Compression of vein; Urinary frequency; Unstable angina; and ASCVD (arteriosclerotic cardiovascular disease) on his problem list.     is allergic to darvocet.  Mr. Ashmead had no medications administered during this visit.  Past Surgical History  Procedure Laterality Date  . Colostomy    . Abdominal surgery  1990's    'for stomach ulcers" (04/17/2013)  . Coronary angioplasty with stent placement  04/17/2013    "?  1" (04/17/2013)  . Inguinal hernia repair Bilateral 1990's  . Colostomy takedown  2013  . Colectomy  2012    "for rectal cancer" (04/17/2013)    Denies any headaches, dizziness, double vision, fevers, chills, night sweats, nausea, vomiting, diarrhea, chest pain, heart palpitations, shortness of breath, blood in stool, black tarry stool, urinary pain, urinary burning, urinary frequency, hematuria.   PHYSICAL  EXAMINATION  ECOG PERFORMANCE STATUS: 1 - Symptomatic but completely ambulatory  Filed Vitals:   02/16/14 0924  BP: 136/74  Pulse: 63  Temp: 97.5 F (36.4 C)  Resp: 20    GENERAL:alert, no distress, well nourished, well developed, comfortable, cooperative and smiling SKIN: skin color, texture, turgor are normal, no rashes or significant lesions HEAD: Normocephalic, No masses, lesions, tenderness or abnormalities EYES: normal, PERRLA, EOMI, Conjunctiva are pink and non-injected EARS: External ears normal OROPHARYNX:mucous membranes are moist  NECK: supple, trachea midline LYMPH:  not examined BREAST:not examined LUNGS: not examined HEART: not examined ABDOMEN:not examined BACK: Back symmetric, no curvature. EXTREMITIES:no edema  NEURO: alert & oriented x 3 with fluent speech, no focal motor/sensory deficits, gait normal    LABORATORY DATA: CBC    Component Value Date/Time   WBC 7.9 12/20/2013 1157   RBC 5.15 12/20/2013 1157   HGB 17.0 12/20/2013 1157   HCT 49.5 12/20/2013 1157   PLT 239 12/20/2013 1157   MCV 96.1 12/20/2013 1157   MCH 33.0 12/20/2013 1157   MCHC 34.3 12/20/2013 1157   RDW 14.1 12/20/2013 1157   LYMPHSABS 1.8 12/20/2013 1157   MONOABS 0.4 12/20/2013 1157   EOSABS 0.1 12/20/2013 1157   BASOSABS 0.0 12/20/2013 1157      Chemistry      Component Value Date/Time   NA 145 12/20/2013 1157   K 4.7 12/20/2013 1157   CL 107 12/20/2013 1157   CO2 28 12/20/2013 1157   BUN 14 12/20/2013 1157   CREATININE 1.20 12/20/2013 1157   CREATININE 1.10 04/14/2013 1617      Component Value Date/Time   CALCIUM 9.7 12/20/2013 1157   ALKPHOS 79 12/20/2013 1157   AST 17 12/20/2013 1157   ALT 13 12/20/2013 1157   BILITOT 0.7 12/20/2013 1157       ASSESSMENT:  1. Continue perineal pain. Well controlled on current pain regimen: MS contin 60 mg every 8 hours and Oxycodone 10-20 mg every 4 hours for breakthrough pain.  2. Pathologic stage I rectal cancer, status post preoperative  Xeloda plus radiotherapy for clinical T3 disease, status post LAR with loop ileostomy performed in August 2012, status post closure of ileostomy with residual severe sacral and perineal pain but no evidence of disease.  3. Hypertension, controlled.  4. Gastroesophageal reflux disease, on treatment 5. Constipation, likely opoid-induced  Patient Active Problem List   Diagnosis Date Noted  . ASCVD (arteriosclerotic cardiovascular disease) 05/02/2013  . Unstable angina 04/18/2013  . Chest pain 03/29/2013  . Overweight 03/29/2013  . Tobacco abuse 03/29/2013  . Hypertension   . Chronic pain syndrome 11/09/2012  . Pain in joint, pelvic region and thigh 11/09/2012  . Neuralgia and neuritis 10/12/2012  . Atherosclerosis of native arteries of the extremities with intermittent claudication 08/19/2012  . Backache 03/24/2012  . Peripheral vascular disease 12/04/2011  . Hyposmolality and/or hyponatremia 12/04/2011  . Acute posthemorrhagic anemia 12/04/2011  . Compression of vein 12/04/2011  . Heartburn 12/03/2011  . Nausea with vomiting 12/03/2011  . Personal history of digestive disease 11/19/2011  . Chronic pain 11/12/2011  .  Depressive disorder 09/24/2011  . Dysuria 08/31/2011  . Impotence of organic origin 08/31/2011  . Urinary frequency 08/31/2011  . Hematoma complicating a procedure 06/25/2011  . Constipation 06/05/2011  . Essential hypertension 06/05/2011  . Anal or rectal pain 06/05/2011  . Malignant neoplasm of rectum 12/02/2010     PLAN:  1. Refill on Ms Contin  2. Refill on Oxycodone 3. Recommend adding MOM or MiraLax to bowel regimen to prevent constipation. 4. Return in 4 weeks for follow-up   THERAPY PLAN:  We will continue to manage his pain. He is well controlled on the current regimen.   All questions were answered. The patient knows to call the clinic with any problems, questions or concerns. We can certainly see the patient much sooner if necessary.  Patient  and plan discussed with Dr. Farrel Gobble and he is in agreement with the aforementioned.    [name] 02/16/2014

## 2014-02-16 ENCOUNTER — Encounter (HOSPITAL_COMMUNITY): Payer: Medicare Other | Attending: Hematology and Oncology | Admitting: Oncology

## 2014-02-16 ENCOUNTER — Ambulatory Visit (HOSPITAL_COMMUNITY): Payer: Medicare Other | Admitting: Oncology

## 2014-02-16 VITALS — BP 136/74 | HR 63 | Temp 97.5°F | Resp 20 | Wt 176.0 lb

## 2014-02-16 DIAGNOSIS — K5900 Constipation, unspecified: Secondary | ICD-10-CM | POA: Diagnosis not present

## 2014-02-16 DIAGNOSIS — G894 Chronic pain syndrome: Secondary | ICD-10-CM | POA: Insufficient documentation

## 2014-02-16 DIAGNOSIS — C20 Malignant neoplasm of rectum: Secondary | ICD-10-CM | POA: Insufficient documentation

## 2014-02-16 DIAGNOSIS — G8929 Other chronic pain: Secondary | ICD-10-CM

## 2014-02-16 DIAGNOSIS — I10 Essential (primary) hypertension: Secondary | ICD-10-CM

## 2014-02-16 MED ORDER — MORPHINE SULFATE ER 60 MG PO TBCR: EXTENDED_RELEASE_TABLET | ORAL | Status: DC

## 2014-02-16 MED ORDER — OXYCODONE HCL 10 MG PO TABS: ORAL_TABLET | ORAL | Status: DC

## 2014-02-16 NOTE — Patient Instructions (Signed)
[facility] Discharge Instructions  RECOMMENDATIONS MADE BY THE CONSULTANT AND ANY TEST RESULTS WILL BE SENT TO YOUR REFERRING PHYSICIAN.  MEDICATIONS PRESCRIBED:  Refill on MS Contin Refill on Oxycodone  INSTRUCTIONS GIVEN AND DISCUSSED: Recommend Milk of Magnesia or MiraLax to supplement stool softeners  SPECIAL INSTRUCTIONS/FOLLOW-UP: Return in 4 weeks for follow and refill on pain medications.  Thank you for choosing [facility] to provide your oncology and hematology care.  To afford each patient quality time with our providers, please arrive at least 15 minutes before your scheduled appointment time.  With your help, our goal is to use those 15 minutes to complete the necessary work-up to ensure our physicians have the information they need to help with your evaluation and healthcare recommendations.    Effective January 1st, 2014, we ask that you re-schedule your appointment with our physicians should you arrive 10 or more minutes late for your appointment.  We strive to give you quality time with our providers, and arriving late affects you and other patients whose appointments are after yours.    Again, thank you for choosing [facility].  Our hope is that these requests will decrease the amount of time that you wait before being seen by our physicians.       _____________________________________________________________  Should you have questions after your visit to [facility], please contact our office at [phone] between the hours of 8:30 a.m. and 5:00 p.m.  Voicemails left after 4:30 p.m. will not be returned until the following business day.  For prescription refill requests, have your pharmacy contact our office with your prescription refill request.

## 2014-03-18 NOTE — Progress Notes (Signed)
Juan Wheeler NOT IN SYSTEM No address on file  Malignant neoplasm of rectum - Plan: Oxycodone HCl 10 MG TABS, morphine (MS CONTIN) 60 MG 12 hr tablet  Chronic pain syndrome - Plan: Oxycodone HCl 10 MG TABS, morphine (MS CONTIN) 60 MG 12 hr tablet  CURRENT THERAPY:MS Contin and oxycodone for chronic persistent perineal pain.  INTERVAL HISTORY: Juan Wheeler 53 y.o. male returns for  regular  visit for followup of chronic perineal pain following definitive treatment for carcinoma of the rectum, status post lumbar epidural injection on 11/07/2013 currently taking MS Contin, gabapentin, and oxycodone.     Malignant neoplasm of rectum   12/02/2010 Initial Diagnosis Malignant neoplasm of rectum   02/03/2011 - 03/13/2011 Chemotherapy Xeloda 1500mg  p.o. BID with Radiotherapy   05/11/2011 Surgery  LAR with temporal loop ileostomy, Dr.Waters, [place], pT2N0   His pain continues to be well controlled on the current regimen of MS Contin and Oxycodone.    He is taking a stool softener and OTC MiraLax or MOM for a bowel regimen.  He denies constipation.  He does have the information for a nerve block at [facility] and the contact information of the pain clinic who was performing them in the past.  He tells me that they were ineffective and his last was months to maybe 1 year ago.  If his pain becomes uncontrolled with pain medication, I would recommend he consider another nerve block injection.  Oncologically, he denies any complaints and ROS questioning is negative.   Past Medical History  Diagnosis Date  . Hypertension   . DVT (deep venous thrombosis) ~ 2013  . Rectal cancer   . Coronary artery disease     a. 03/2013: abnl nuc -> LHC s/p DES to LCx, residual moderate disease in LAD (med rx unless refractory angina). b. Not on BB due to bradycardia.  Marland Kitchen History of blood transfusion     "once; after throwing up alot of blood" (04/17/2013)  . GERD (gastroesophageal reflux disease)   . Chronic lower back pain      a. Followed by pain management at [facility].  . PAD (peripheral artery disease)     a. Occlusion of the right internal iliac artery, with significant atherosclerosis in the left internal iliac which was not amenable to reconstruction per notes from [facility].  . LV dysfunction     a. EF 45% in 03/2013.  . Tobacco abuse   . Colon cancer     rectal cancer    has Chest pain; Tobacco abuse; Atherosclerosis of native arteries of the extremities with intermittent claudication; Backache; Chronic pain syndrome; Peripheral vascular disease; Constipation; Depressive disorder; Dysuria; Heartburn; Pain in joint, pelvic region and thigh; Personal history of digestive disease; Essential hypertension; Impotence of organic origin; Malignant neoplasm of rectum; Neuralgia and neuritis; Anal or rectal pain; Compression of vein; Urinary frequency; Unstable angina; and ASCVD (arteriosclerotic cardiovascular disease) on his problem list.     is allergic to darvocet.  Juan Wheeler had no medications administered during this visit.  Past Surgical History  Procedure Laterality Date  . Colostomy    . Abdominal surgery  1990's    'for stomach ulcers" (04/17/2013)  . Coronary angioplasty with stent placement  04/17/2013    "?1" (04/17/2013)  . Inguinal hernia repair Bilateral 1990's  . Colostomy takedown  2013  . Colectomy  2012    "for rectal cancer" (04/17/2013)    Denies any headaches, dizziness, double vision, fevers, chills, night sweats, nausea,  vomiting, diarrhea, constipation, chest pain, heart palpitations, shortness of breath, blood in stool, black tarry stool, urinary pain, urinary burning, urinary frequency, hematuria.   PHYSICAL EXAMINATION  ECOG PERFORMANCE STATUS: 0 - Asymptomatic  Filed Vitals:   03/20/14 1000  BP: 114/77  Pulse: 60  Temp: 98.4 F (36.9 C)  Resp: 18    GENERAL:alert, no distress, well nourished, well developed, comfortable, cooperative and smiling SKIN: skin  color, texture, turgor are normal, no rashes or significant lesions HEAD: Normocephalic, No masses, lesions, tenderness or abnormalities EYES: normal, PERRLA, EOMI, Conjunctiva are pink and non-injected EARS: External ears normal OROPHARYNX:mucous membranes are moist  NECK: supple, trachea midline LYMPH:  not examined BREAST:not examined LUNGS: not examined HEART: not examined ABDOMEN:not examined BACK: Back symmetric, no curvature. EXTREMITIES:less then 2 second capillary refill, no clubbing, no cyanosis  NEURO: alert & oriented x 3 with fluent speech, no focal motor/sensory deficits, gait normal    ASSESSMENT:  1. Continued perineal pain. Well controlled on current pain regimen: MS contin 60 mg every 8 hours and Oxycodone 10-20 mg every 4 hours for breakthrough pain.  2. Pathologic stage I rectal cancer, status post preoperative Xeloda plus radiotherapy for clinical T3 disease, status post LAR with loop ileostomy performed in August 2012, status post closure of ileostomy with residual severe sacral and perineal pain but no evidence of disease.  3. Hypertension, controlled.  4. Gastroesophageal reflux disease, on treatment  5. Constipation, likely opoid-induced, on bowel regimen  Patient Active Problem List   Diagnosis Date Noted  . ASCVD (arteriosclerotic cardiovascular disease) 05/02/2013  . Unstable angina 04/18/2013  . Chest pain 03/29/2013  . Tobacco abuse 03/29/2013  . Chronic pain syndrome 11/09/2012  . Pain in joint, pelvic region and thigh 11/09/2012  . Neuralgia and neuritis 10/12/2012  . Atherosclerosis of native arteries of the extremities with intermittent claudication 08/19/2012  . Backache 03/24/2012  . Peripheral vascular disease 12/04/2011  . Compression of vein 12/04/2011  . Heartburn 12/03/2011  . Personal history of digestive disease 11/19/2011  . Depressive disorder 09/24/2011  . Dysuria 08/31/2011  . Impotence of organic origin 08/31/2011  . Urinary  frequency 08/31/2011  . Constipation 06/05/2011  . Essential hypertension 06/05/2011  . Anal or rectal pain 06/05/2011  . Malignant neoplasm of rectum 12/02/2010     PLAN:  1. Refill on Ms Contin  2. Refill on Oxycodone  3. Discussion regarding bowel regimen 4. Return in 4 weeks for follow-up   THERAPY PLAN:  We will continue to manage his pain. He is well controlled on the current regimen.  All questions were answered. The patient knows to call the clinic with any problems, questions or concerns. We can certainly see the patient much sooner if necessary.  Patient and plan discussed with Dr. Farrel Gobble and he is in agreement with the aforementioned.   KEFALAS,THOMAS 03/20/2014

## 2014-03-20 ENCOUNTER — Encounter (HOSPITAL_COMMUNITY): Payer: Medicare Other | Attending: Hematology and Oncology | Admitting: Oncology

## 2014-03-20 ENCOUNTER — Encounter (HOSPITAL_COMMUNITY): Payer: Self-pay | Admitting: Oncology

## 2014-03-20 VITALS — BP 114/77 | HR 60 | Temp 98.4°F | Resp 18 | Wt 173.6 lb

## 2014-03-20 DIAGNOSIS — K5900 Constipation, unspecified: Secondary | ICD-10-CM | POA: Diagnosis not present

## 2014-03-20 DIAGNOSIS — C20 Malignant neoplasm of rectum: Secondary | ICD-10-CM | POA: Insufficient documentation

## 2014-03-20 DIAGNOSIS — G894 Chronic pain syndrome: Secondary | ICD-10-CM | POA: Diagnosis not present

## 2014-03-20 MED ORDER — MORPHINE SULFATE ER 60 MG PO TBCR: EXTENDED_RELEASE_TABLET | ORAL | Status: DC

## 2014-03-20 MED ORDER — OXYCODONE HCL 10 MG PO TABS: ORAL_TABLET | ORAL | Status: DC

## 2014-03-20 NOTE — Patient Instructions (Signed)
[facility] Discharge Instructions  RECOMMENDATIONS MADE BY THE CONSULTANT AND ANY TEST RESULTS WILL BE SENT TO YOUR REFERRING PHYSICIAN.  EXAM FINDINGS BY THE PHYSICIAN TODAY AND SIGNS OR SYMPTOMS TO REPORT TO CLINIC OR PRIMARY PHYSICIANYou saw Juan Wheeler today  Continue taking stool softners to help with bowels, follow up with doctor in 4 weeks  Thank you for choosing [facility] to provide your oncology and hematology care.  To afford each patient quality time with our providers, please arrive at least 15 minutes before your scheduled appointment time.  With your help, our goal is to use those 15 minutes to complete the necessary work-up to ensure our physicians have the information they need to help with your evaluation and healthcare recommendations.    Effective January 1st, 2014, we ask that you re-schedule your appointment with our physicians should you arrive 10 or more minutes late for your appointment.  We strive to give you quality time with our providers, and arriving late affects you and other patients whose appointments are after yours.    Again, thank you for choosing [facility].  Our hope is that these requests will decrease the amount of time that you wait before being seen by our physicians.       _____________________________________________________________  Should you have questions after your visit to [facility], please contact our office at [phone] between the hours of 8:30 a.m. and 5:00 p.m.  Voicemails left after 4:30 p.m. will not be returned until the following business day.  For prescription refill requests, have your pharmacy contact our office with your prescription refill request.

## 2014-04-10 ENCOUNTER — Ambulatory Visit (INDEPENDENT_AMBULATORY_CARE_PROVIDER_SITE_OTHER): Payer: Medicare Other | Admitting: Cardiovascular Disease

## 2014-04-10 ENCOUNTER — Encounter: Payer: Self-pay | Admitting: Cardiovascular Disease

## 2014-04-10 VITALS — BP 122/86 | HR 79 | Ht 69.0 in | Wt 173.0 lb

## 2014-04-10 DIAGNOSIS — Z716 Tobacco abuse counseling: Secondary | ICD-10-CM

## 2014-04-10 DIAGNOSIS — I7090 Unspecified atherosclerosis: Secondary | ICD-10-CM

## 2014-04-10 DIAGNOSIS — F17200 Nicotine dependence, unspecified, uncomplicated: Secondary | ICD-10-CM

## 2014-04-10 DIAGNOSIS — I70219 Atherosclerosis of native arteries of extremities with intermittent claudication, unspecified extremity: Secondary | ICD-10-CM

## 2014-04-10 DIAGNOSIS — N529 Male erectile dysfunction, unspecified: Secondary | ICD-10-CM

## 2014-04-10 DIAGNOSIS — I2510 Atherosclerotic heart disease of native coronary artery without angina pectoris: Secondary | ICD-10-CM

## 2014-04-10 DIAGNOSIS — E785 Hyperlipidemia, unspecified: Secondary | ICD-10-CM

## 2014-04-10 DIAGNOSIS — N528 Other male erectile dysfunction: Secondary | ICD-10-CM

## 2014-04-10 DIAGNOSIS — Z7189 Other specified counseling: Secondary | ICD-10-CM

## 2014-04-10 DIAGNOSIS — I10 Essential (primary) hypertension: Secondary | ICD-10-CM

## 2014-04-10 DIAGNOSIS — F17209 Nicotine dependence, unspecified, with unspecified nicotine-induced disorders: Secondary | ICD-10-CM

## 2014-04-10 NOTE — Patient Instructions (Signed)
Your physician wants you to follow-up in: 6 months You will receive a reminder letter in the mail two months in advance. If you don't receive a letter, please call our office to schedule the follow-up appointment.     Your physician recommends that you continue on your current medications as directed. Please refer to the Current Medication list given to you today.      Thank you for choosing [facility] !        

## 2014-04-10 NOTE — Progress Notes (Signed)
Patient ID: Juan Wheeler, male   DOB: [birth year], 53 y.o.   MRN: [id number]      SUBJECTIVE: The patient is here for routine cardiovascular followup. He has a history of drug-eluting stent placement to the left circumflex coronary artery in July 2014 with moderate to severe obstructive disease in the distal LAD and mild disease in proximal LAD and right coronary artery. He also has hypertension, hyperlipidemia, rectal cancer, history of tobacco abuse, and peripheral vascular disease.  He very seldom gets chest pain and has only had to use 3 or 4 sublingual nitroglycerin tablets in the past year. He tries to stay active by doing yardwork. He denies orthopnea, paroxysmal nocturnal dyspnea, palpitations, lightheadedness, leg swelling and syncope. He has some mild exertional claudication of the left leg. He has chronic dyspnea which is unchanged.  ECG performed in the office today demonstrates normal sinus rhythm with sinus arrhythmia, heart rate 86 beats per minute.  Allergies  Allergen Reactions  . Darvocet [Propoxyphene N-Acetaminophen] Palpitations    Current Outpatient Prescriptions  Medication Sig Dispense Refill  . amLODipine (NORVASC) 5 MG tablet TAKE 1 TABLET (5 MG TOTAL) BY MOUTH DAILY.  30 tablet  6  . aspirin EC 81 MG tablet Take 81 mg by mouth daily.      Marland Kitchen atorvastatin (LIPITOR) 80 MG tablet Take 1 tablet (80 mg total) by mouth every evening.  30 tablet  6  . Docusate Calcium (STOOL SOFTENER PO) Take 1 capsule by mouth as needed.      . gabapentin (NEURONTIN) 300 MG capsule Take 900-1,200 mg by mouth 3 (three) times daily. Takes 900mg  in the morning, 1200mg  in the afternoon, and 1200mg  in the evening.      . isosorbide mononitrate (IMDUR) 30 MG 24 hr tablet TAKE 1/2 TABLET DAILY  15 tablet  6  . lisinopril (PRINIVIL,ZESTRIL) 40 MG tablet TAKE 1 TABLET (40 MG TOTAL) BY MOUTH DAILY.      Marland Kitchen Loperamide HCl (IMODIUM PO) Take 1 capsule by mouth as needed.      Marland Kitchen morphine (MS CONTIN) 60  MG 12 hr tablet Take 1 tablet every 8 hours.  90 tablet  0  . nitroGLYCERIN (NITROSTAT) 0.4 MG SL tablet Place 1 tablet (0.4 mg total) under the tongue every 5 (five) minutes as needed for chest pain (up to 3 doses).  25 tablet  3  . omeprazole (PRILOSEC) 40 MG capsule Take 40 mg by mouth daily.      Marland Kitchen OVER THE COUNTER MEDICATION Take 1 tablet by mouth as needed. OTC med for itching      . Oxycodone HCl 10 MG TABS Take 1 or 2 tablets every 4 hours as needed for pain..  100 tablet  0  . prasugrel (EFFIENT) 10 MG TABS tablet Take 1 tablet (10 mg total) by mouth daily.  30 tablet  6   No current facility-administered medications for this visit.    Past Medical History  Diagnosis Date  . Hypertension   . DVT (deep venous thrombosis) ~ 2013  . Rectal cancer   . Coronary artery disease     a. 03/2013: abnl nuc -> LHC s/p DES to LCx, residual moderate disease in LAD (med rx unless refractory angina). b. Not on BB due to bradycardia.  Marland Kitchen History of blood transfusion     "once; after throwing up alot of blood" (04/17/2013)  . GERD (gastroesophageal reflux disease)   . Chronic lower back pain     a. Followed  by pain management at [facility].  . PAD (peripheral artery disease)     a. Occlusion of the right internal iliac artery, with significant atherosclerosis in the left internal iliac which was not amenable to reconstruction per notes from [facility].  . LV dysfunction     a. EF 45% in 03/2013.  . Tobacco abuse   . Colon cancer     rectal cancer    Past Surgical History  Procedure Laterality Date  . Colostomy    . Abdominal surgery  1990's    'for stomach ulcers" (04/17/2013)  . Coronary angioplasty with stent placement  04/17/2013    "?1" (04/17/2013)  . Inguinal hernia repair Bilateral 1990's  . Colostomy takedown  2013  . Colectomy  2012    "for rectal cancer" (04/17/2013)    History   Social History  . Marital Status: Single    Spouse Name: N/A    Number of Children: N/A  .  Years of Education: N/A   Occupational History  . Not on file.   Social History Main Topics  . Smoking status: Current Every Day Smoker -- 1.00 packs/day for 40 years    Types: Cigarettes  . Smokeless tobacco: Current User  . Alcohol Use: No     Comment: 04/17/2013 "bout a 6 pack/wk"  . Drug Use: No     Comment: last use 2 days ago  . Sexual Activity: Yes    Birth Control/ Protection: None   Other Topics Concern  . Not on file   Social History Narrative  . No narrative on file     Filed Vitals:   04/10/14 0853  BP: 122/86  Pulse: 79  Height: 5\' 9"  (1.753 m)  Weight: 173 lb (78.472 kg)  SpO2: 98%    PHYSICAL EXAM General: NAD Neck: No JVD, no thyromegaly. Lungs: Diminished b/l but clear, no rales or wheezes. CV: Nondisplaced PMI.  Regular rate and rhythm, normal S1/S2, no S3/S4, no murmur. No pretibial or periankle edema.  No carotid bruit.  Normal pedal pulses.  Abdomen: Soft, nontender, no hepatosplenomegaly, no distention.  Neurologic: Alert and oriented x 3.  Psych: Normal affect. Extremities: No clubbing or cyanosis.   ECG: reviewed and available in electronic records.      ASSESSMENT AND PLAN: 1. CAD: Continue ASA, Lipitor, Imdur, and Effient. I had previously prescribed Toprol-XL 25 mg daily but he is no longer on this. He reportedly had issues with bradycardia in the past. 2. HTN: Controlled on present therapy which includes lisinopril and amlodipine.  3. Hyperlipidemia: On high dose Lipitor 80 mg daily.  4. Tobacco abuse: Extensive counseling again provided with respect to cessation. I do not believe he will quit.  5. Erectile dysfunction: He again requests samples of Viagra, and he has been instructed not to take nitrates when doing so.  6. PVD: Continue medical therapy. Tobacco cessation is paramount.   Dispo: f/u 6 months.  Kate Sable, M.D., F.A.C.C.

## 2014-04-14 NOTE — Progress Notes (Signed)
[place], MD [place] [address]  Malignant neoplasm of rectum - Plan: Oxycodone HCl 10 MG TABS, morphine (MS CONTIN) 60 MG 12 hr tablet  Chronic pain syndrome - Plan: Oxycodone HCl 10 MG TABS, morphine (MS CONTIN) 60 MG 12 hr tablet  CURRENT THERAPY: MS Contin and oxycodone for chronic persistent perineal pain.  INTERVAL HISTORY: Juan Wheeler 53 y.o. male returns for  regular  visit for followup of chronic perineal pain following definitive treatment for carcinoma of the rectum, status post lumbar epidural injection on 11/07/2013 currently taking MS Contin, gabapentin, and oxycodone.     Malignant neoplasm of rectum   12/02/2010 Initial Diagnosis Malignant neoplasm of rectum   02/03/2011 - 03/13/2011 Chemotherapy Xeloda 1500mg  p.o. BID with Radiotherapy   05/11/2011 Surgery  LAR with temporal loop ileostomy, Dr.Waters, [place], pT2N0   His pain continues to be well controlled on the current regimen of MS Contin and Oxycodone.   He does have the information for a nerve block at [facility] and the contact information of the pain clinic who was performing them in the past. He tells me that they were ineffective and his last was months to maybe 1 year ago. If his pain becomes uncontrolled with pain medication, I would recommend he consider another nerve block injection.  He notes some dark urine, but denies any urinary pain, burning, and gross hematuria.  I have a low suspicion for UTI, but he is advised to contact us back with any problems.  He reports that he and his wife consume a 24 pack of H2O weekly.  Therefore, I suspect he is not consuming enough water and therefore his urine is concentrated.  He is encouraged to drink more H2O daily.  He is to follow-up with his primary care provider as scheduled and discuss this complaint with him.  Otherwise, his bowel are moving 4-5 times per day, mostly at night.  They are not loose.  This is likely his new normal given his  colorectal surgery.  Oncologically, he denies any complaints and ROS questioning is negative.   Past Medical History  Diagnosis Date  . Hypertension   . DVT (deep venous thrombosis) ~ 2013  . Rectal cancer   . Coronary artery disease     a. 03/2013: abnl nuc -> LHC s/p DES to LCx, residual moderate disease in LAD (med rx unless refractory angina). b. Not on BB due to bradycardia.  Marland Kitchen History of blood transfusion     "once; after throwing up alot of blood" (04/17/2013)  . GERD (gastroesophageal reflux disease)   . Chronic lower back pain     a. Followed by pain management at [facility].  . PAD (peripheral artery disease)     a. Occlusion of the right internal iliac artery, with significant atherosclerosis in the left internal iliac which was not amenable to reconstruction per notes from [facility].  . LV dysfunction     a. EF 45% in 03/2013.  . Tobacco abuse   . Colon cancer     rectal cancer    has Chest pain; Tobacco abuse; Atherosclerosis of native arteries of the extremities with intermittent claudication; Backache; Chronic pain syndrome; Peripheral vascular disease; Constipation; Depressive disorder; Dysuria; Heartburn; Pain in joint, pelvic region and thigh; Personal history of digestive disease; Essential hypertension; Impotence of organic origin; Malignant neoplasm of rectum; Neuralgia and neuritis; Anal or rectal pain; Compression of vein; Urinary frequency; Unstable angina; and ASCVD (arteriosclerotic cardiovascular disease) on  his problem list.     is allergic to darvocet.  Juan Wheeler had no medications administered during this visit.  Past Surgical History  Procedure Laterality Date  . Colostomy    . Abdominal surgery  1990's    'for stomach ulcers" (04/17/2013)  . Coronary angioplasty with stent placement  04/17/2013    "?1" (04/17/2013)  . Inguinal hernia repair Bilateral 1990's  . Colostomy takedown  2013  . Colectomy  2012    "for rectal cancer" (04/17/2013)     Denies any headaches, dizziness, double vision, fevers, chills, night sweats, nausea, vomiting, diarrhea, constipation, chest pain, heart palpitations, shortness of breath, blood in stool, black tarry stool, urinary pain, urinary burning, urinary frequency, hematuria.   PHYSICAL EXAMINATION  ECOG PERFORMANCE STATUS: 0 - Asymptomatic  Filed Vitals:   04/19/14 0950  BP: 133/85  Pulse: 96  Temp: 98 F (36.7 C)  Resp: 20    GENERAL:alert, healthy, no distress, well nourished, well developed, comfortable, cooperative and smiling SKIN: skin color, texture, turgor are normal, no rashes or significant lesions HEAD: Normocephalic, No masses, lesions, tenderness or abnormalities EYES: normal, PERRLA, EOMI, Conjunctiva are pink and non-injected EARS: External ears normal OROPHARYNX:mucous membranes are moist  NECK: supple, no adenopathy, thyroid normal size, non-tender, without nodularity, no stridor, non-tender, trachea midline LYMPH:  no palpable lymphadenopathy BREAST:not examined LUNGS: clear to auscultation  HEART: regular rate & rhythm, no murmurs and no gallops ABDOMEN:abdomen soft and normal bowel sounds BACK: Back symmetric, no curvature. EXTREMITIES:less then 2 second capillary refill, no joint deformities, effusion, or inflammation, no skin discoloration, no clubbing, no cyanosis  NEURO: alert & oriented x 3 with fluent speech, no focal motor/sensory deficits, gait normal     ASSESSMENT:  1. Continued perineal pain. Well controlled on current pain regimen: MS contin 60 mg every 8 hours and Oxycodone 10-20 mg every 4 hours for breakthrough pain.  2. Pathologic stage I rectal cancer, status post preoperative Xeloda plus radiotherapy for clinical T3 disease, status post LAR with loop ileostomy performed in August 2012, status post closure of ileostomy with residual severe sacral and perineal pain but no evidence of disease.  3. Hypertension, controlled.  4. Gastroesophageal  reflux disease, on treatment  5. Constipation, likely opoid-induced, on bowel regimen  Patient Active Problem List   Diagnosis Date Noted  . ASCVD (arteriosclerotic cardiovascular disease) 05/02/2013  . Unstable angina 04/18/2013  . Chest pain 03/29/2013  . Tobacco abuse 03/29/2013  . Chronic pain syndrome 11/09/2012  . Pain in joint, pelvic region and thigh 11/09/2012  . Neuralgia and neuritis 10/12/2012  . Atherosclerosis of native arteries of the extremities with intermittent claudication 08/19/2012  . Backache 03/24/2012  . Peripheral vascular disease 12/04/2011  . Compression of vein 12/04/2011  . Heartburn 12/03/2011  . Personal history of digestive disease 11/19/2011  . Depressive disorder 09/24/2011  . Dysuria 08/31/2011  . Impotence of organic origin 08/31/2011  . Urinary frequency 08/31/2011  . Constipation 06/05/2011  . Essential hypertension 06/05/2011  . Anal or rectal pain 06/05/2011  . Malignant neoplasm of rectum 12/02/2010     PLAN:  1. Refill on Ms Contin  2. Refill on Oxycodone  3. Recommend smoking cessation 4. Encouraged PO H2O intake 5. Follow-up with primary care provider as directed 6. Return in 4 weeks for follow-up   THERAPY PLAN:  We will continue to manage his pain. He is well controlled on the current regimen.   All questions were answered. The patient knows to  call the clinic with any problems, questions or concerns. We can certainly see the patient much sooner if necessary.  Patient and plan discussed with Dr.Michael Heller and he is in agreement with the aforementioned.   Juan Wheeler 04/19/2014

## 2014-04-19 ENCOUNTER — Encounter (HOSPITAL_COMMUNITY): Payer: Medicare Other | Attending: Hematology and Oncology | Admitting: Oncology

## 2014-04-19 VITALS — BP 133/85 | HR 96 | Temp 98.0°F | Resp 20 | Wt 171.2 lb

## 2014-04-19 DIAGNOSIS — C20 Malignant neoplasm of rectum: Secondary | ICD-10-CM | POA: Insufficient documentation

## 2014-04-19 DIAGNOSIS — G894 Chronic pain syndrome: Secondary | ICD-10-CM

## 2014-04-19 DIAGNOSIS — K5900 Constipation, unspecified: Secondary | ICD-10-CM | POA: Diagnosis not present

## 2014-04-19 MED ORDER — MORPHINE SULFATE ER 60 MG PO TBCR: EXTENDED_RELEASE_TABLET | ORAL | Status: DC

## 2014-04-19 MED ORDER — OXYCODONE HCL 10 MG PO TABS: ORAL_TABLET | ORAL | Status: DC

## 2014-04-19 NOTE — Patient Instructions (Addendum)
[facility] Discharge Instructions  RECOMMENDATIONS MADE BY THE CONSULTANT AND ANY TEST RESULTS WILL BE SENT TO YOUR REFERRING PHYSICIAN.  MEDICATIONS PRESCRIBED:  Refill on pain medications  INSTRUCTIONS GIVEN AND DISCUSSED: Urge you to drink more water to help with your urine color.  SPECIAL INSTRUCTIONS/FOLLOW-UP: Return in 4 weeks for follow-up 05/17/14 @ 10 with Gershon Mussel  Thank you for choosing [facility] to provide your oncology and hematology care.  To afford each patient quality time with our providers, please arrive at least 15 minutes before your scheduled appointment time.  With your help, our goal is to use those 15 minutes to complete the necessary work-up to ensure our physicians have the information they need to help with your evaluation and healthcare recommendations.    Effective January 1st, 2014, we ask that you re-schedule your appointment with our physicians should you arrive 10 or more minutes late for your appointment.  We strive to give you quality time with our providers, and arriving late affects you and other patients whose appointments are after yours.    Again, thank you for choosing [facility].  Our hope is that these requests will decrease the amount of time that you wait before being seen by our physicians.       _____________________________________________________________  Should you have questions after your visit to [facility], please contact our office at [phone] between the hours of 8:30 a.m. and 5:00 p.m.  Voicemails left after 4:30 p.m. will not be returned until the following business day.  For prescription refill requests, have your pharmacy contact our office with your prescription refill request.

## 2014-05-01 DIAGNOSIS — I10 Essential (primary) hypertension: Secondary | ICD-10-CM | POA: Diagnosis not present

## 2014-05-01 DIAGNOSIS — I2510 Atherosclerotic heart disease of native coronary artery without angina pectoris: Secondary | ICD-10-CM | POA: Diagnosis not present

## 2014-05-01 DIAGNOSIS — D012 Carcinoma in situ of rectum: Secondary | ICD-10-CM | POA: Diagnosis not present

## 2014-05-15 NOTE — Progress Notes (Signed)
Juan Wheeler,[name], MD [place] [address]  Chronic pain syndrome  Malignant neoplasm of rectum  CURRENT THERAPY:MS Contin and oxycodone for chronic persistent perineal pain.  INTERVAL HISTORY: Lawarence Meek 53 y.o. male returns for  regular  visit for followup of chronic perineal pain following definitive treatment for carcinoma of the rectum, status post lumbar epidural injection on 11/07/2013 currently taking MS Contin, gabapentin, and oxycodone.     Malignant neoplasm of rectum   12/02/2010 Initial Diagnosis Malignant neoplasm of rectum   02/03/2011 - 03/13/2011 Chemotherapy Xeloda 1500mg  p.o. BID with Radiotherapy   05/11/2011 Surgery  LAR with temporal loop ileostomy, Dr.Waters, [place], pT2N0    He is doing well.  Pain medication regimen controls his pain.  He denies any complaints today.  Past Medical History  Diagnosis Date  . Hypertension   . DVT (deep venous thrombosis) ~ 2013  . Rectal cancer   . Coronary artery disease     a. 03/2013: abnl nuc -> LHC s/p DES to LCx, residual moderate disease in LAD (med rx unless refractory angina). b. Not on BB due to bradycardia.  Marland Kitchen History of blood transfusion     "once; after throwing up alot of blood" (04/17/2013)  . GERD (gastroesophageal reflux disease)   . Chronic lower back pain     a. Followed by pain management at [facility].  . PAD (peripheral artery disease)     a. Occlusion of the right internal iliac artery, with significant atherosclerosis in the left internal iliac which was not amenable to reconstruction per notes from [facility].  . LV dysfunction     a. EF 45% in 03/2013.  . Tobacco abuse   . Colon cancer     rectal cancer    has Chest pain; Tobacco abuse; Atherosclerosis of native arteries of the extremities with intermittent claudication; Backache; Chronic pain syndrome; Peripheral vascular disease; Constipation; Depressive disorder; Dysuria; Heartburn; Pain in joint, pelvic region and  thigh; Personal history of digestive disease; Essential hypertension; Impotence of organic origin; Malignant neoplasm of rectum; Neuralgia and neuritis; Anal or rectal pain; Compression of vein; Urinary frequency; Unstable angina; and ASCVD (arteriosclerotic cardiovascular disease) on his problem list.     is allergic to darvocet.  Mr. Duvall does not currently have medications on file.  Past Surgical History  Procedure Laterality Date  . Colostomy    . Abdominal surgery  1990's    'for stomach ulcers" (04/17/2013)  . Coronary angioplasty with stent placement  04/17/2013    "?1" (04/17/2013)  . Inguinal hernia repair Bilateral 1990's  . Colostomy takedown  2013  . Colectomy  2012    "for rectal cancer" (04/17/2013)    Denies any headaches, dizziness, double vision, fevers, chills, night sweats, nausea, vomiting, diarrhea, constipation, chest pain, heart palpitations, shortness of breath, blood in stool, black tarry stool, urinary pain, urinary burning, urinary frequency, hematuria.   PHYSICAL EXAMINATION  ECOG PERFORMANCE STATUS: 1 - Symptomatic but completely ambulatory  Filed Vitals:   05/17/14 1025  BP: 142/97  Pulse: 86  Temp: 97.9 F (36.6 C)  Resp: 18    GENERAL:alert, no distress, well nourished, well developed, comfortable, cooperative and smiling SKIN: skin color, texture, turgor are normal, no rashes or significant lesions HEAD: Normocephalic, No masses, lesions, tenderness or abnormalities EYES: normal, PERRLA, EOMI, Conjunctiva are pink and non-injected EARS: External ears normal OROPHARYNX:mucous membranes are moist  NECK: supple, trachea midline LYMPH:  not examined BREAST:not examined LUNGS: not examined  HEART: not examined ABDOMEN:not examined BACK: Back symmetric, no curvature. EXTREMITIES:less then 2 second capillary refill, no cyanosis  NEURO: alert & oriented x 3 with fluent speech, no focal motor/sensory deficits, gait normal   ASSESSMENT:  1.  Continued perineal pain. Well controlled on current pain regimen: MS contin 60 mg every 8 hours and Oxycodone 10-20 mg every 4 hours for breakthrough pain.  2. Pathologic stage I rectal cancer, status post preoperative Xeloda plus radiotherapy for clinical T3 disease, status post LAR with loop ileostomy performed in August 2012, status post closure of ileostomy with residual severe sacral and perineal pain but no evidence of disease.  3. Hypertension, controlled.  4. Gastroesophageal reflux disease, on treatment  5. Constipation, likely opoid-induced, on bowel regimen  Patient Active Problem List   Diagnosis Date Noted  . ASCVD (arteriosclerotic cardiovascular disease) 05/02/2013  . Unstable angina 04/18/2013  . Chest pain 03/29/2013  . Tobacco abuse 03/29/2013  . Chronic pain syndrome 11/09/2012  . Pain in joint, pelvic region and thigh 11/09/2012  . Neuralgia and neuritis 10/12/2012  . Atherosclerosis of native arteries of the extremities with intermittent claudication 08/19/2012  . Backache 03/24/2012  . Peripheral vascular disease 12/04/2011  . Compression of vein 12/04/2011  . Heartburn 12/03/2011  . Personal history of digestive disease 11/19/2011  . Depressive disorder 09/24/2011  . Dysuria 08/31/2011  . Impotence of organic origin 08/31/2011  . Urinary frequency 08/31/2011  . Constipation 06/05/2011  . Essential hypertension 06/05/2011  . Anal or rectal pain 06/05/2011  . Malignant neoplasm of rectum 12/02/2010     PLAN:  1. Refill on Ms Contin  2. Refill on Oxycodone  3. Recommend smoking cessation  4. Encouraged PO H2O intake  5. Follow-up with primary care provider as directed  6. Return in 4 weeks for follow-up   THERAPY PLAN:  We will continue to manage his pain. He is well controlled on the current regimen.   All questions were answered. The patient knows to call the clinic with any problems, questions or concerns. We can certainly see the patient much  sooner if necessary.  Patient and plan discussed with Dr. Farrel Gobble and he is in agreement with the aforementioned.   Vaden Becherer 05/17/2014

## 2014-05-17 ENCOUNTER — Encounter (HOSPITAL_COMMUNITY): Payer: Medicare Other | Attending: Hematology and Oncology | Admitting: Oncology

## 2014-05-17 ENCOUNTER — Encounter (HOSPITAL_COMMUNITY): Payer: Self-pay | Admitting: Oncology

## 2014-05-17 VITALS — BP 142/97 | HR 86 | Temp 97.9°F | Resp 18 | Wt 171.0 lb

## 2014-05-17 DIAGNOSIS — C20 Malignant neoplasm of rectum: Secondary | ICD-10-CM | POA: Insufficient documentation

## 2014-05-17 DIAGNOSIS — G894 Chronic pain syndrome: Secondary | ICD-10-CM | POA: Diagnosis not present

## 2014-05-17 MED ORDER — MORPHINE SULFATE ER 60 MG PO TBCR: EXTENDED_RELEASE_TABLET | ORAL | Status: DC

## 2014-05-17 MED ORDER — OXYCODONE HCL 10 MG PO TABS: ORAL_TABLET | ORAL | Status: DC

## 2014-05-17 NOTE — Patient Instructions (Signed)
[facility] Discharge Instructions  RECOMMENDATIONS MADE BY THE CONSULTANT AND ANY TEST RESULTS WILL BE SENT TO YOUR REFERRING PHYSICIAN.  Return for office visit in 4 weeks  Thank you for choosing [facility] to provide your oncology and hematology care.  To afford each patient quality time with our providers, please arrive at least 15 minutes before your scheduled appointment time.  With your help, our goal is to use those 15 minutes to complete the necessary work-up to ensure our physicians have the information they need to help with your evaluation and healthcare recommendations.    Effective January 1st, 2014, we ask that you re-schedule your appointment with our physicians should you arrive 10 or more minutes late for your appointment.  We strive to give you quality time with our providers, and arriving late affects you and other patients whose appointments are after yours.    Again, thank you for choosing [facility].  Our hope is that these requests will decrease the amount of time that you wait before being seen by our physicians.       _____________________________________________________________  Should you have questions after your visit to [facility], please contact our office at [phone] between the hours of 8:30 a.m. and 4:30 p.m.  Voicemails left after 4:30 p.m. will not be returned until the following business day.  For prescription refill requests, have your pharmacy contact our office with your prescription refill request.    _______________________________________________________________  We hope that we have given you very good care.  You may receive a patient satisfaction survey in the mail, please complete it and return it as soon as possible.  We value your feedback!  _______________________________________________________________  Have you asked about our STAR program?  STAR stands for Survivorship  Training and Rehabilitation, and this is a nationally recognized cancer care program that focuses on survivorship and rehabilitation.  Cancer and cancer treatments may cause problems, such as, pain, making you feel tired and keeping you from doing the things that you need or want to do. Cancer rehabilitation can help. Our goal is to reduce these troubling effects and help you have the best quality of life possible.  You may receive a survey from a nurse that asks questions about your current state of health.  Based on the survey results, all eligible patients will be referred to the [facility] program for an evaluation so we can better serve you!  A frequently asked questions sheet is available upon request.

## 2014-05-22 ENCOUNTER — Other Ambulatory Visit (HOSPITAL_COMMUNITY): Payer: Self-pay | Admitting: Physician Assistant

## 2014-06-12 NOTE — Progress Notes (Signed)
[facility], MD [place] [address]  Chronic pain syndrome - Plan: morphine (MS CONTIN) 60 MG 12 hr tablet, Oxycodone HCl 10 MG TABS  Malignant neoplasm of rectum - Plan: morphine (MS CONTIN) 60 MG 12 hr tablet, Oxycodone HCl 10 MG TABS  URI (upper respiratory infection) - Plan: amoxicillin-clavulanate (AUGMENTIN) 875-125 MG per tablet  CURRENT THERAPY:MS Contin and oxycodone for chronic persistent perineal pain.  INTERVAL HISTORY: Juan Wheeler 53 y.o. male returns for  regular  visit for followup of chronic perineal pain following definitive treatment for carcinoma of the rectum, status post lumbar epidural injection on 11/07/2013 currently taking MS Contin, gabapentin, and oxycodone.     Malignant neoplasm of rectum   12/02/2010 Initial Diagnosis Malignant neoplasm of rectum   02/03/2011 - 03/13/2011 Chemotherapy Xeloda 1500mg  p.o. BID with Radiotherapy   05/11/2011 Surgery  LAR with temporal loop ileostomy, Dr.Waters, [facility], pT2N0   His pain continues to be well controlled with the current regimen of oxycodone and MS Contin. No changes to those prescriptions at this point time.  He does complain of upper respiratory tract infection symptoms. He notes a sore throat with a cough that is productive of sputum. He notes his sputum is yellowish in color. He denies any fevers or chills at this point time. He's been taking over-the-counter [place]-seltzer for a few days without benefit. He also admits to nasal congestion.  Otherwise, oncologically, the patient denies any complaints and ROS questioning is negative.  Past Medical History  Diagnosis Date  . Hypertension   . DVT (deep venous thrombosis) ~ 2013  . Rectal cancer   . Coronary artery disease     a. 03/2013: abnl nuc -> LHC s/p DES to LCx, residual moderate disease in LAD (med rx unless refractory angina). b. Not on BB due to bradycardia.  Marland Kitchen History of blood transfusion     "once; after throwing up alot  of blood" (04/17/2013)  . GERD (gastroesophageal reflux disease)   . Chronic lower back pain     a. Followed by pain management at [facility].  . PAD (peripheral artery disease)     a. Occlusion of the right internal iliac artery, with significant atherosclerosis in the left internal iliac which was not amenable to reconstruction per notes from [facility].  . LV dysfunction     a. EF 45% in 03/2013.  . Tobacco abuse   . Colon cancer     rectal cancer    has Chest pain; Tobacco abuse; Atherosclerosis of native arteries of the extremities with intermittent claudication; Backache; Chronic pain syndrome; Peripheral vascular disease; Constipation; Depressive disorder; Dysuria; Heartburn; Pain in joint, pelvic region and thigh; Personal history of digestive disease; Essential hypertension; Impotence of organic origin; Malignant neoplasm of rectum; Neuralgia and neuritis; Anal or rectal pain; Compression of vein; Urinary frequency; Unstable angina; and ASCVD (arteriosclerotic cardiovascular disease) on his problem list.     is allergic to darvocet.  Juan Wheeler had no medications administered during this visit.  Past Surgical History  Procedure Laterality Date  . Colostomy    . Abdominal surgery  1990's    'for stomach ulcers" (04/17/2013)  . Coronary angioplasty with stent placement  04/17/2013    "?1" (04/17/2013)  . Inguinal hernia repair Bilateral 1990's  . Colostomy takedown  2013  . Colectomy  2012    "for rectal cancer" (04/17/2013)    Denies any headaches, dizziness, double vision, fevers, chills, night sweats, nausea, vomiting, diarrhea, constipation,  chest pain, heart palpitations, shortness of breath, blood in stool, black tarry stool, urinary pain, urinary burning, urinary frequency, hematuria.   PHYSICAL EXAMINATION  ECOG PERFORMANCE STATUS: 0 - Asymptomatic  Filed Vitals:   06/15/14 1000  BP: 128/91  Pulse: 99  Temp: 97.5 F (36.4 C)  Resp: 20    GENERAL:alert, no  distress, well nourished, well developed, comfortable, cooperative and smiling SKIN: skin color, texture, turgor are normal, no rashes or significant lesions HEAD: Normocephalic, No masses, lesions, tenderness or abnormalities EYES: normal, PERRLA, EOMI, Conjunctiva are pink and non-injected EARS: External ears normal OROPHARYNX:halitosis, moderate erythema and post nasal drip  NECK: supple, thyroid normal size, non-tender, without nodularity, no stridor, non-tender, trachea midline, minimal ankle the jaw reactive lymphadenopathy. LYMPH:  Angle of the jaw with adenopathy, reactive BREAST:not examined LUNGS: clear to auscultation  HEART: regular rate & rhythm, no murmurs and no gallops ABDOMEN:abdomen soft, non-tender and normal bowel sounds BACK: Back symmetric, no curvature. EXTREMITIES:less then 2 second capillary refill, no joint deformities, effusion, or inflammation, no skin discoloration, no clubbing, no cyanosis  NEURO: alert & oriented x 3 with fluent speech, no focal motor/sensory deficits, gait normal    ASSESSMENT:  1. Continued perineal pain. Well controlled on current pain regimen: MS contin 60 mg every 8 hours and Oxycodone 10-20 mg every 4 hours for breakthrough pain.  2. Pathologic stage I rectal cancer, status post preoperative Xeloda plus radiotherapy for clinical T3 disease, status post LAR with loop ileostomy performed in August 2012, status post closure of ileostomy with residual severe sacral and perineal pain but no evidence of disease.  3. Hypertension, controlled.  4. Gastroesophageal reflux disease, on treatment  5. Constipation, likely opoid-induced, on bowel regimen 6. URI with sore throat, cough, and sputum production with angle of jaw lymphadenopathy  Patient Active Problem List   Diagnosis Date Noted  . ASCVD (arteriosclerotic cardiovascular disease) 05/02/2013  . Unstable angina 04/18/2013  . Chest pain 03/29/2013  . Tobacco abuse 03/29/2013  . Chronic  pain syndrome 11/09/2012  . Pain in joint, pelvic region and thigh 11/09/2012  . Neuralgia and neuritis 10/12/2012  . Atherosclerosis of native arteries of the extremities with intermittent claudication 08/19/2012  . Backache 03/24/2012  . Peripheral vascular disease 12/04/2011  . Compression of vein 12/04/2011  . Heartburn 12/03/2011  . Personal history of digestive disease 11/19/2011  . Depressive disorder 09/24/2011  . Dysuria 08/31/2011  . Impotence of organic origin 08/31/2011  . Urinary frequency 08/31/2011  . Constipation 06/05/2011  . Essential hypertension 06/05/2011  . Anal or rectal pain 06/05/2011  . Malignant neoplasm of rectum 12/02/2010     PLAN:  1. Refill on Ms Contin  2. Refill on Oxycodone  3. Recommend smoking cessation  4. Encouraged PO H2O intake  5. Follow-up with primary care provider as directed  6. Follow-up with primary care provider for Lipitor refill 7. Rx for Augmentin x 7 days for URI 8. Return in 4 weeks for follow-up   THERAPY PLAN:  We will continue to manage his pain. He is well controlled on the current regimen.  All questions were answered. The patient knows to call the clinic with any problems, questions or concerns. We can certainly see the patient much sooner if necessary.  Patient and plan discussed with Dr. Farrel Gobble and he is in agreement with the aforementioned.   Zaakirah Kistner 06/15/2014

## 2014-06-15 ENCOUNTER — Encounter (HOSPITAL_COMMUNITY): Payer: Medicare Other | Attending: Hematology and Oncology | Admitting: Oncology

## 2014-06-15 ENCOUNTER — Encounter (HOSPITAL_COMMUNITY): Payer: Self-pay | Admitting: Oncology

## 2014-06-15 VITALS — BP 128/91 | HR 99 | Temp 97.5°F | Resp 20 | Wt 170.7 lb

## 2014-06-15 DIAGNOSIS — C20 Malignant neoplasm of rectum: Secondary | ICD-10-CM | POA: Insufficient documentation

## 2014-06-15 DIAGNOSIS — K219 Gastro-esophageal reflux disease without esophagitis: Secondary | ICD-10-CM

## 2014-06-15 DIAGNOSIS — N509 Disorder of male genital organs, unspecified: Secondary | ICD-10-CM

## 2014-06-15 DIAGNOSIS — R599 Enlarged lymph nodes, unspecified: Secondary | ICD-10-CM

## 2014-06-15 DIAGNOSIS — I10 Essential (primary) hypertension: Secondary | ICD-10-CM | POA: Diagnosis not present

## 2014-06-15 DIAGNOSIS — G894 Chronic pain syndrome: Secondary | ICD-10-CM

## 2014-06-15 DIAGNOSIS — J069 Acute upper respiratory infection, unspecified: Secondary | ICD-10-CM

## 2014-06-15 MED ORDER — OXYCODONE HCL 10 MG PO TABS: ORAL_TABLET | ORAL | Status: DC

## 2014-06-15 MED ORDER — AMOXICILLIN-POT CLAVULANATE 875-125 MG PO TABS: 1.0000 | ORAL_TABLET | Freq: Two times a day (BID) | ORAL | Status: DC

## 2014-06-15 MED ORDER — MORPHINE SULFATE ER 60 MG PO TBCR: EXTENDED_RELEASE_TABLET | ORAL | Status: DC

## 2014-06-15 NOTE — Patient Instructions (Signed)
[facility] Discharge Instructions  RECOMMENDATIONS MADE BY THE CONSULTANT AND ANY TEST RESULTS WILL BE SENT TO YOUR REFERRING PHYSICIAN.  We will see you in 4 weeks for follow up. Augmentin prescription will be sent to your pharmacy, take as prescribed. Please call for any questions or concerns.   Thank you for choosing [facility] to provide your oncology and hematology care.  To afford each patient quality time with our providers, please arrive at least 15 minutes before your scheduled appointment time.  With your help, our goal is to use those 15 minutes to complete the necessary work-up to ensure our physicians have the information they need to help with your evaluation and healthcare recommendations.    Effective January 1st, 2014, we ask that you re-schedule your appointment with our physicians should you arrive 10 or more minutes late for your appointment.  We strive to give you quality time with our providers, and arriving late affects you and other patients whose appointments are after yours.    Again, thank you for choosing [facility].  Our hope is that these requests will decrease the amount of time that you wait before being seen by our physicians.       _____________________________________________________________  Should you have questions after your visit to [facility], please contact our office at [phone] between the hours of 8:30 a.m. and 4:30 p.m.  Voicemails left after 4:30 p.m. will not be returned until the following business day.  For prescription refill requests, have your pharmacy contact our office with your prescription refill request.    _______________________________________________________________  We hope that we have given you very good care.  You may receive a patient satisfaction survey in the mail, please complete it and return it as soon as possible.  We value your  feedback!  _______________________________________________________________  Have you asked about our STAR program?  STAR stands for Survivorship Training and Rehabilitation, and this is a nationally recognized cancer care program that focuses on survivorship and rehabilitation.  Cancer and cancer treatments may cause problems, such as, pain, making you feel tired and keeping you from doing the things that you need or want to do. Cancer rehabilitation can help. Our goal is to reduce these troubling effects and help you have the best quality of life possible.  You may receive a survey from a nurse that asks questions about your current state of health.  Based on the survey results, all eligible patients will be referred to the [facility] program for an evaluation so we can better serve you!  A frequently asked questions sheet is available upon request.

## 2014-06-29 ENCOUNTER — Other Ambulatory Visit: Payer: Self-pay | Admitting: Adult Health

## 2014-07-04 DIAGNOSIS — Z23 Encounter for immunization: Secondary | ICD-10-CM | POA: Diagnosis not present

## 2014-07-04 DIAGNOSIS — R58 Hemorrhage, not elsewhere classified: Secondary | ICD-10-CM | POA: Diagnosis not present

## 2014-07-04 DIAGNOSIS — Z0000 Encounter for general adult medical examination without abnormal findings: Secondary | ICD-10-CM | POA: Diagnosis not present

## 2014-07-04 DIAGNOSIS — I2510 Atherosclerotic heart disease of native coronary artery without angina pectoris: Secondary | ICD-10-CM | POA: Diagnosis not present

## 2014-07-04 DIAGNOSIS — I10 Essential (primary) hypertension: Secondary | ICD-10-CM | POA: Diagnosis not present

## 2014-07-12 NOTE — Progress Notes (Signed)
[name],[name], MD [place] [address]  Chronic pain syndrome - Plan: morphine (MS CONTIN) 60 MG 12 hr tablet, Oxycodone HCl 10 MG TABS, Comprehensive metabolic panel  Malignant neoplasm of rectum - Plan: morphine (MS CONTIN) 60 MG 12 hr tablet, Oxycodone HCl 10 MG TABS, CBC with Differential, Comprehensive metabolic panel  Macrocytosis without anemia - Plan: CBC with Differential, Vitamin B12, Folate  CURRENT THERAPY: MS Contin and oxycodone for chronic persistent perineal pain.  INTERVAL HISTORY: Juan Wheeler 53 y.o. male returns for  regular  visit for followup of MS Contin and oxycodone for chronic persistent perineal pain.    Malignant neoplasm of rectum   12/02/2010 Initial Diagnosis Malignant neoplasm of rectum   02/03/2011 - 03/13/2011 Chemotherapy Xeloda 1500mg  p.o. BID with Radiotherapy   05/11/2011 Surgery  LAR with temporal loop ileostomy, Dr.Waters, [place], pT2N0   His pain continues to be well controlled with the current regimen of oxycodone and MS Contin. No changes to those prescriptions at this point time.  He went to see Dr. [name], primary care provider recently, when he discovered a large ecchymosis of his left biceps.  He reports that an insect landed on him and he killed the insect by hitting it while is was on his arm.  The consequence and collateral damage associated with that maneuver was a large left biceps ecchymosis.  Labs were performed by Dr. [name] and the patient received an influenza vaccine in the past for this year.  Otherwise, oncologically, the patient denies any complaints and ROS questioning is negative.    Past Medical History  Diagnosis Date  . Hypertension   . DVT (deep venous thrombosis) ~ 2013  . Rectal cancer   . Coronary artery disease     a. 03/2013: abnl nuc -> LHC s/p DES to LCx, residual moderate disease in LAD (med rx unless refractory angina). b. Not on BB due to bradycardia.  Marland Kitchen History of blood transfusion      "once; after throwing up alot of blood" (04/17/2013)  . GERD (gastroesophageal reflux disease)   . Chronic lower back pain     a. Followed by pain management at [facility].  . PAD (peripheral artery disease)     a. Occlusion of the right internal iliac artery, with significant atherosclerosis in the left internal iliac which was not amenable to reconstruction per notes from [facility].  . LV dysfunction     a. EF 45% in 03/2013.  . Tobacco abuse   . Colon cancer     rectal cancer    has Chest pain; Tobacco abuse; Atherosclerosis of native arteries of the extremities with intermittent claudication; Backache; Chronic pain syndrome; Peripheral vascular disease; Constipation; Depressive disorder; Dysuria; Heartburn; Pain in joint, pelvic region and thigh; Personal history of digestive disease; Essential hypertension; Impotence of organic origin; Malignant neoplasm of rectum; Neuralgia and neuritis; Anal or rectal pain; Compression of vein; Urinary frequency; Unstable angina; and ASCVD (arteriosclerotic cardiovascular disease) on his problem list.     is allergic to darvocet.  Mr. Kappes had no medications administered during this visit.  Past Surgical History  Procedure Laterality Date  . Colostomy    . Abdominal surgery  1990's    'for stomach ulcers" (04/17/2013)  . Coronary angioplasty with stent placement  04/17/2013    "?1" (04/17/2013)  . Inguinal hernia repair Bilateral 1990's  . Colostomy takedown  2013  . Colectomy  2012    "for rectal cancer" (04/17/2013)  Denies any headaches, dizziness, double vision, fevers, chills, night sweats, nausea, vomiting, diarrhea, constipation, chest pain, heart palpitations, shortness of breath, blood in stool, black tarry stool, urinary pain, urinary burning, urinary frequency, hematuria.   PHYSICAL EXAMINATION  ECOG PERFORMANCE STATUS: 0 - Asymptomatic  Filed Vitals:   07/13/14 1033  BP: 147/87  Pulse: 84  Temp: 98 F (36.7 C)    Resp: 16    GENERAL:alert, no distress, well nourished, well developed, comfortable, cooperative and smiling SKIN: skin color, texture, turgor are normal, no rashes or significant lesions HEAD: Normocephalic, No masses, lesions, tenderness or abnormalities EYES: normal, PERRLA, EOMI, Conjunctiva are pink and non-injected EARS: External ears normal OROPHARYNX:lips, buccal mucosa, and tongue normal and mucous membranes are moist  NECK: supple, no adenopathy, thyroid normal size, non-tender, without nodularity, no stridor, non-tender, trachea midline LYMPH:  no palpable lymphadenopathy BREAST:not examined LUNGS: clear to auscultation  HEART: regular rate & rhythm ABDOMEN:non-tender BACK: Back symmetric, no curvature. EXTREMITIES:less then 2 second capillary refill, no joint deformities, effusion, or inflammation, no skin discoloration, no clubbing, no cyanosis. Large left biceps ecchymosis with visual  Changes of healing and resolving. NEURO: alert & oriented x 3 with fluent speech, no focal motor/sensory deficits, gait normal  LABS:  Labs from 07/04/2014 at PCP office: WBC 7.2 Hgb 17.4 H HCT 54.5 H MCV 1.1.6 H Plt 182   ASSESSMENT:  1. Continued perineal pain. Well controlled on current pain regimen: MS contin 60 mg every 8 hours and Oxycodone 10-20 mg every 4 hours for breakthrough pain.  2. Pathologic stage I rectal cancer, status post preoperative Xeloda plus radiotherapy for clinical T3 disease, status post LAR with loop ileostomy performed in August 2012, status post closure of ileostomy with residual severe sacral and perineal pain but no evidence of disease.  3. Hypertension, controlled.  4. Gastroesophageal reflux disease, on treatment  5. Constipation, likely opoid-induced, on bowel regimen 6. Polycythemia secondary to tobacco abuse and lung disease 7. Macrocytosis without anemia.  Patient Active Problem List   Diagnosis Date Noted  . ASCVD (arteriosclerotic  cardiovascular disease) 05/02/2013  . Unstable angina 04/18/2013  . Chest pain 03/29/2013  . Tobacco abuse 03/29/2013  . Chronic pain syndrome 11/09/2012  . Pain in joint, pelvic region and thigh 11/09/2012  . Neuralgia and neuritis 10/12/2012  . Atherosclerosis of native arteries of the extremities with intermittent claudication 08/19/2012  . Backache 03/24/2012  . Peripheral vascular disease 12/04/2011  . Compression of vein 12/04/2011  . Heartburn 12/03/2011  . Personal history of digestive disease 11/19/2011  . Depressive disorder 09/24/2011  . Dysuria 08/31/2011  . Impotence of organic origin 08/31/2011  . Urinary frequency 08/31/2011  . Constipation 06/05/2011  . Essential hypertension 06/05/2011  . Anal or rectal pain 06/05/2011  . Malignant neoplasm of rectum 12/02/2010     PLAN:  1. Refill on Ms Contin  2. Refill on Oxycodone  3. Recommend smoking cessation  4. Encouraged PO H2O intake  5. Follow-up with primary care provider as directed  6. Follow-up with primary care provider for Lipitor refill 7. Labs in 4 weeks: CBC diff, CMET, Folate, B12 8. I personally reviewed and went over laboratory results with the patient.  The results are noted within this dictation. 9. Return in 4 weeks for follow-up   THERAPY PLAN:  We will continue to manage his pain. He is well controlled on the current regimen.   All questions were answered. The patient knows to call the clinic with any problems,  questions or concerns. We can certainly see the patient much sooner if necessary.  Patient and plan discussed with Dr. Farrel Gobble and he is in agreement with the aforementioned.   Juan Wheeler 07/13/2014

## 2014-07-13 ENCOUNTER — Encounter (HOSPITAL_COMMUNITY): Payer: Medicare Other | Attending: Hematology and Oncology | Admitting: Oncology

## 2014-07-13 ENCOUNTER — Encounter (HOSPITAL_COMMUNITY): Payer: Self-pay | Admitting: Oncology

## 2014-07-13 VITALS — BP 147/87 | HR 84 | Temp 98.0°F | Resp 16 | Wt 175.0 lb

## 2014-07-13 DIAGNOSIS — Z720 Tobacco use: Secondary | ICD-10-CM | POA: Diagnosis not present

## 2014-07-13 DIAGNOSIS — C20 Malignant neoplasm of rectum: Secondary | ICD-10-CM | POA: Diagnosis not present

## 2014-07-13 DIAGNOSIS — D7589 Other specified diseases of blood and blood-forming organs: Secondary | ICD-10-CM | POA: Insufficient documentation

## 2014-07-13 DIAGNOSIS — G893 Neoplasm related pain (acute) (chronic): Secondary | ICD-10-CM | POA: Diagnosis not present

## 2014-07-13 DIAGNOSIS — G894 Chronic pain syndrome: Secondary | ICD-10-CM | POA: Insufficient documentation

## 2014-07-13 MED ORDER — MORPHINE SULFATE ER 60 MG PO TBCR: EXTENDED_RELEASE_TABLET | ORAL | Status: DC

## 2014-07-13 MED ORDER — OXYCODONE HCL 10 MG PO TABS: ORAL_TABLET | ORAL | Status: DC

## 2014-07-13 NOTE — Patient Instructions (Signed)
[facility] Discharge Instructions  RECOMMENDATIONS MADE BY THE CONSULTANT AND ANY TEST RESULTS WILL BE SENT TO YOUR REFERRING PHYSICIAN.  EXAM FINDINGS BY THE PHYSICIAN TODAY AND SIGNS OR SYMPTOMS TO REPORT TO CLINIC OR PRIMARY PHYSICIAN: Exam and findings as discussed by Robynn Pane, [name]. Will recheck some labs when you come back in 4 weeks.  Report uncontrolled pain or other concerns  MEDICATIONS PRESCRIBED:  Refills for MS Contin and Oxycodone  INSTRUCTIONS/FOLLOW-UP: Follow-up in 4 weeks with labs and office visit.  Thank you for choosing [facility] to provide your oncology and hematology care.  To afford each patient quality time with our providers, please arrive at least 15 minutes before your scheduled appointment time.  With your help, our goal is to use those 15 minutes to complete the necessary work-up to ensure our physicians have the information they need to help with your evaluation and healthcare recommendations.    Effective January 1st, 2014, we ask that you re-schedule your appointment with our physicians should you arrive 10 or more minutes late for your appointment.  We strive to give you quality time with our providers, and arriving late affects you and other patients whose appointments are after yours.    Again, thank you for choosing [facility].  Our hope is that these requests will decrease the amount of time that you wait before being seen by our physicians.       _____________________________________________________________  Should you have questions after your visit to [facility], please contact our office at [phone] between the hours of 8:30 a.m. and 4:30 p.m.  Voicemails left after 4:30 p.m. will not be returned until the following business day.  For prescription refill requests, have your pharmacy contact our office with your prescription refill request.     _______________________________________________________________  We hope that we have given you very good care.  You may receive a patient satisfaction survey in the mail, please complete it and return it as soon as possible.  We value your feedback!  _______________________________________________________________  Have you asked about our STAR program?  STAR stands for Survivorship Training and Rehabilitation, and this is a nationally recognized cancer care program that focuses on survivorship and rehabilitation.  Cancer and cancer treatments may cause problems, such as, pain, making you feel tired and keeping you from doing the things that you need or want to do. Cancer rehabilitation can help. Our goal is to reduce these troubling effects and help you have the best quality of life possible.  You may receive a survey from a nurse that asks questions about your current state of health.  Based on the survey results, all eligible patients will be referred to the [facility] program for an evaluation so we can better serve you!  A frequently asked questions sheet is available upon request.

## 2014-07-18 ENCOUNTER — Telehealth: Payer: Self-pay | Admitting: Cardiology

## 2014-07-18 NOTE — Telephone Encounter (Signed)
Returned call to [place] at [facility] advised to call patient's cardiologist Dr.Koneswarran for cardiac rehab order.

## 2014-07-18 NOTE — Telephone Encounter (Signed)
She need orders for Cardiac Rehab for October 1,2014 to October 6,2014.

## 2014-07-20 ENCOUNTER — Telehealth: Payer: Self-pay | Admitting: *Deleted

## 2014-07-20 NOTE — Telephone Encounter (Signed)
PT NEEDS REFERRAL TO CARDIAC REHAB BY [facility] Bronson Ing. THERE IS A OLD REFERRALS IN SYSTEM BUT THEY CAN NOT USE THIS ONE/TMJ

## 2014-07-21 NOTE — Telephone Encounter (Signed)
Per Dr.Koneswaran, too much time as elapsed to be beneficial for patient.We received call from medical records and not patient regarding this issue per [place]

## 2014-07-28 ENCOUNTER — Other Ambulatory Visit (HOSPITAL_COMMUNITY): Payer: Self-pay | Admitting: Adult Health
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
## 2014-08-08 ENCOUNTER — Emergency Department (HOSPITAL_COMMUNITY): Payer: Medicare Other

## 2014-08-08 ENCOUNTER — Encounter (HOSPITAL_COMMUNITY): Payer: Self-pay | Admitting: Emergency Medicine

## 2014-08-08 ENCOUNTER — Emergency Department (HOSPITAL_COMMUNITY)
Admission: EM | Admit: 2014-08-08 | Discharge: 2014-08-08 | Disposition: A | Discharge status: Home / Self Care | Payer: Medicare Other | Attending: Emergency Medicine | Admitting: Emergency Medicine

## 2014-08-08 DIAGNOSIS — K219 Gastro-esophageal reflux disease without esophagitis: Secondary | ICD-10-CM | POA: Insufficient documentation

## 2014-08-08 DIAGNOSIS — Z79899 Other long term (current) drug therapy: Secondary | ICD-10-CM | POA: Diagnosis not present

## 2014-08-08 DIAGNOSIS — S299XXA Unspecified injury of thorax, initial encounter: Secondary | ICD-10-CM | POA: Insufficient documentation

## 2014-08-08 DIAGNOSIS — Y92410 Unspecified street and highway as the place of occurrence of the external cause: Secondary | ICD-10-CM | POA: Insufficient documentation

## 2014-08-08 DIAGNOSIS — Z9861 Coronary angioplasty status: Secondary | ICD-10-CM | POA: Insufficient documentation

## 2014-08-08 DIAGNOSIS — Z86718 Personal history of other venous thrombosis and embolism: Secondary | ICD-10-CM | POA: Diagnosis not present

## 2014-08-08 DIAGNOSIS — I10 Essential (primary) hypertension: Secondary | ICD-10-CM | POA: Insufficient documentation

## 2014-08-08 DIAGNOSIS — Y9389 Activity, other specified: Secondary | ICD-10-CM | POA: Insufficient documentation

## 2014-08-08 DIAGNOSIS — I2510 Atherosclerotic heart disease of native coronary artery without angina pectoris: Secondary | ICD-10-CM | POA: Diagnosis not present

## 2014-08-08 DIAGNOSIS — Z720 Tobacco use: Secondary | ICD-10-CM | POA: Insufficient documentation

## 2014-08-08 DIAGNOSIS — Z7982 Long term (current) use of aspirin: Secondary | ICD-10-CM | POA: Insufficient documentation

## 2014-08-08 DIAGNOSIS — Z85048 Personal history of other malignant neoplasm of rectum, rectosigmoid junction, and anus: Secondary | ICD-10-CM | POA: Insufficient documentation

## 2014-08-08 DIAGNOSIS — S3991XA Unspecified injury of abdomen, initial encounter: Secondary | ICD-10-CM | POA: Insufficient documentation

## 2014-08-08 DIAGNOSIS — IMO0002 Reserved for concepts with insufficient information to code with codable children: Secondary | ICD-10-CM

## 2014-08-08 DIAGNOSIS — R109 Unspecified abdominal pain: Secondary | ICD-10-CM | POA: Diagnosis not present

## 2014-08-08 DIAGNOSIS — R1030 Lower abdominal pain, unspecified: Secondary | ICD-10-CM | POA: Diagnosis not present

## 2014-08-08 DIAGNOSIS — Y998 Other external cause status: Secondary | ICD-10-CM | POA: Diagnosis not present

## 2014-08-08 DIAGNOSIS — G8929 Other chronic pain: Secondary | ICD-10-CM | POA: Diagnosis not present

## 2014-08-08 DIAGNOSIS — R102 Pelvic and perineal pain: Secondary | ICD-10-CM

## 2014-08-08 LAB — URINALYSIS, ROUTINE W REFLEX MICROSCOPIC
Bilirubin Urine: NEGATIVE
Glucose, UA: NEGATIVE mg/dL
Hgb urine dipstick: NEGATIVE
Ketones, ur: NEGATIVE mg/dL
Leukocytes, UA: NEGATIVE
Nitrite: NEGATIVE
Protein, ur: NEGATIVE mg/dL
Specific Gravity, Urine: 1.025 (ref 1.005–1.030)
Urobilinogen, UA: 0.2 mg/dL (ref 0.0–1.0)
pH: 5.5 (ref 5.0–8.0)

## 2014-08-08 LAB — CBC WITH DIFFERENTIAL/PLATELET
Basophils Absolute: 0 10*3/uL (ref 0.0–0.1)
Basophils Relative: 0 % (ref 0–1)
Eosinophils Absolute: 0.2 10*3/uL (ref 0.0–0.7)
Eosinophils Relative: 3 % (ref 0–5)
HCT: 49.2 % (ref 39.0–52.0)
Hemoglobin: 17.2 g/dL — ABNORMAL HIGH (ref 13.0–17.0)
Lymphocytes Relative: 30 % (ref 12–46)
Lymphs Abs: 2.1 10*3/uL (ref 0.7–4.0)
MCH: 33.1 pg (ref 26.0–34.0)
MCHC: 35 g/dL (ref 30.0–36.0)
MCV: 94.8 fL (ref 78.0–100.0)
Monocytes Absolute: 0.6 10*3/uL (ref 0.1–1.0)
Monocytes Relative: 9 % (ref 3–12)
Neutro Abs: 4 10*3/uL (ref 1.7–7.7)
Neutrophils Relative %: 58 % (ref 43–77)
Platelets: 203 10*3/uL (ref 150–400)
RBC: 5.19 MIL/uL (ref 4.22–5.81)
RDW: 14.8 % (ref 11.5–15.5)
WBC: 7 10*3/uL (ref 4.0–10.5)

## 2014-08-08 LAB — BASIC METABOLIC PANEL
Anion gap: 13 (ref 5–15)
BUN: 9 mg/dL (ref 6–23)
CO2: 25 mEq/L (ref 19–32)
Calcium: 9.1 mg/dL (ref 8.4–10.5)
Chloride: 101 mEq/L (ref 96–112)
Creatinine, Ser: 1.18 mg/dL (ref 0.50–1.35)
GFR calc Af Amer: 79 mL/min — ABNORMAL LOW (ref >90)
GFR calc non Af Amer: 68 mL/min — ABNORMAL LOW (ref >90)
Glucose, Bld: 87 mg/dL (ref 70–99)
Potassium: 4.4 mEq/L (ref 3.7–5.3)
Sodium: 139 mEq/L (ref 137–147)

## 2014-08-08 MED ORDER — IOHEXOL 300 MG/ML  SOLN
100.0000 mL | Freq: Once | INTRAMUSCULAR | Status: AC | PRN
  Administered 2014-08-08: 100 mL via INTRAVENOUS

## 2014-08-08 MED ORDER — ONDANSETRON HCL 4 MG/2ML IJ SOLN
4.0000 mg | Freq: Once | INTRAMUSCULAR | Status: AC
  Administered 2014-08-08: 4 mg via INTRAVENOUS

## 2014-08-08 MED ORDER — HYDROMORPHONE HCL 1 MG/ML IJ SOLN
0.5000 mg | Freq: Once | INTRAMUSCULAR | Status: AC
  Administered 2014-08-08: 0.5 mg via INTRAVENOUS
  Filled 2014-08-08: qty 1

## 2014-08-08 MED ORDER — ONDANSETRON HCL 4 MG/2ML IJ SOLN
4.0000 mg | Freq: Once | INTRAMUSCULAR | Status: DC
  Filled 2014-08-08: qty 2
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
## 2014-08-08 NOTE — ED Notes (Signed)
[employer] officer at bedside,

## 2014-08-08 NOTE — ED Notes (Signed)
[place] PA at bedside,  
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
## 2014-08-08 NOTE — Discharge Instructions (Signed)
Your CT scan is negative for any fracture, or internal injury. There is a question of a new nodule in the left lung. Please have your cancer specialist to review the CT and to evaluate this for you.
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
## 2014-08-10 ENCOUNTER — Encounter (HOSPITAL_BASED_OUTPATIENT_CLINIC_OR_DEPARTMENT_OTHER): Payer: Medicare Other

## 2014-08-10 ENCOUNTER — Encounter (HOSPITAL_COMMUNITY): Payer: Self-pay | Admitting: Oncology

## 2014-08-10 ENCOUNTER — Encounter (HOSPITAL_COMMUNITY): Payer: Medicare Other | Attending: Hematology and Oncology | Admitting: Oncology

## 2014-08-10 VITALS — BP 116/90 | HR 97 | Temp 98.3°F | Resp 16 | Wt 176.0 lb

## 2014-08-10 DIAGNOSIS — D7589 Other specified diseases of blood and blood-forming organs: Secondary | ICD-10-CM | POA: Diagnosis not present

## 2014-08-10 DIAGNOSIS — D751 Secondary polycythemia: Secondary | ICD-10-CM | POA: Diagnosis not present

## 2014-08-10 DIAGNOSIS — R102 Pelvic and perineal pain: Secondary | ICD-10-CM | POA: Diagnosis not present

## 2014-08-10 DIAGNOSIS — G894 Chronic pain syndrome: Secondary | ICD-10-CM

## 2014-08-10 DIAGNOSIS — R918 Other nonspecific abnormal finding of lung field: Secondary | ICD-10-CM

## 2014-08-10 DIAGNOSIS — Z85048 Personal history of other malignant neoplasm of rectum, rectosigmoid junction, and anus: Secondary | ICD-10-CM

## 2014-08-10 DIAGNOSIS — Z720 Tobacco use: Secondary | ICD-10-CM

## 2014-08-10 DIAGNOSIS — C20 Malignant neoplasm of rectum: Secondary | ICD-10-CM

## 2014-08-10 LAB — COMPREHENSIVE METABOLIC PANEL
ALT: 12 U/L (ref 0–53)
AST: 16 U/L (ref 0–37)
Albumin: 3.7 g/dL (ref 3.5–5.2)
Alkaline Phosphatase: 85 U/L (ref 39–117)
Anion gap: 13 (ref 5–15)
BUN: 11 mg/dL (ref 6–23)
CO2: 24 mEq/L (ref 19–32)
Calcium: 9.5 mg/dL (ref 8.4–10.5)
Chloride: 102 mEq/L (ref 96–112)
Creatinine, Ser: 1.08 mg/dL (ref 0.50–1.35)
GFR calc Af Amer: 88 mL/min — ABNORMAL LOW (ref >90)
GFR calc non Af Amer: 76 mL/min — ABNORMAL LOW (ref >90)
Glucose, Bld: 135 mg/dL — ABNORMAL HIGH (ref 70–99)
Potassium: 4.1 mEq/L (ref 3.7–5.3)
Sodium: 139 mEq/L (ref 137–147)
Total Bilirubin: 0.6 mg/dL (ref 0.3–1.2)
Total Protein: 7.9 g/dL (ref 6.0–8.3)

## 2014-08-10 LAB — CBC WITH DIFFERENTIAL/PLATELET
Basophils Absolute: 0 10*3/uL (ref 0.0–0.1)
Basophils Relative: 0 % (ref 0–1)
Eosinophils Absolute: 0.1 10*3/uL (ref 0.0–0.7)
Eosinophils Relative: 2 % (ref 0–5)
HCT: 50.6 % (ref 39.0–52.0)
Hemoglobin: 17.9 g/dL — ABNORMAL HIGH (ref 13.0–17.0)
Lymphocytes Relative: 22 % (ref 12–46)
Lymphs Abs: 1.6 10*3/uL (ref 0.7–4.0)
MCH: 33.5 pg (ref 26.0–34.0)
MCHC: 35.4 g/dL (ref 30.0–36.0)
MCV: 94.8 fL (ref 78.0–100.0)
Monocytes Absolute: 0.5 10*3/uL (ref 0.1–1.0)
Monocytes Relative: 7 % (ref 3–12)
Neutro Abs: 5 10*3/uL (ref 1.7–7.7)
Neutrophils Relative %: 69 % (ref 43–77)
Platelets: 190 10*3/uL (ref 150–400)
RBC: 5.34 MIL/uL (ref 4.22–5.81)
RDW: 14.8 % (ref 11.5–15.5)
WBC: 7.2 10*3/uL (ref 4.0–10.5)

## 2014-08-10 LAB — VITAMIN B12: Vitamin B-12: 406 pg/mL (ref 211–911)

## 2014-08-10 LAB — FOLATE: Folate: 14.8 ng/mL

## 2014-08-10 MED ORDER — OXYCODONE HCL 10 MG PO TABS: ORAL_TABLET | ORAL | Status: DC

## 2014-08-10 MED ORDER — MORPHINE SULFATE ER 60 MG PO TBCR: EXTENDED_RELEASE_TABLET | ORAL | Status: DC

## 2014-08-10 NOTE — Progress Notes (Signed)
Lab draw
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
## 2014-08-20 ENCOUNTER — Ambulatory Visit (HOSPITAL_COMMUNITY)
Admission: RE | Admit: 2014-08-20 | Discharge: 2014-08-20 | Disposition: A | Discharge status: Home / Self Care | Payer: Medicare Other | Source: Ambulatory Visit | Attending: Oncology | Admitting: Oncology

## 2014-08-20 DIAGNOSIS — R918 Other nonspecific abnormal finding of lung field: Secondary | ICD-10-CM | POA: Insufficient documentation

## 2014-08-20 DIAGNOSIS — C20 Malignant neoplasm of rectum: Secondary | ICD-10-CM

## 2014-08-20 DIAGNOSIS — F17210 Nicotine dependence, cigarettes, uncomplicated: Secondary | ICD-10-CM | POA: Insufficient documentation

## 2014-08-20 DIAGNOSIS — Z720 Tobacco use: Secondary | ICD-10-CM

## 2014-08-22 ENCOUNTER — Encounter (HOSPITAL_BASED_OUTPATIENT_CLINIC_OR_DEPARTMENT_OTHER): Payer: Medicare Other | Admitting: Oncology

## 2014-08-22 ENCOUNTER — Encounter (HOSPITAL_COMMUNITY): Payer: Self-pay | Admitting: Oncology

## 2014-08-22 VITALS — BP 131/82 | HR 98 | Temp 98.6°F | Resp 16 | Wt 174.9 lb

## 2014-08-22 DIAGNOSIS — Z720 Tobacco use: Secondary | ICD-10-CM

## 2014-08-22 DIAGNOSIS — C20 Malignant neoplasm of rectum: Secondary | ICD-10-CM

## 2014-08-22 DIAGNOSIS — G894 Chronic pain syndrome: Secondary | ICD-10-CM

## 2014-08-22 DIAGNOSIS — R9389 Abnormal findings on diagnostic imaging of other specified body structures: Secondary | ICD-10-CM

## 2014-08-22 DIAGNOSIS — R918 Other nonspecific abnormal finding of lung field: Secondary | ICD-10-CM

## 2014-08-22 MED ORDER — MORPHINE SULFATE ER 60 MG PO TBCR: EXTENDED_RELEASE_TABLET | ORAL | Status: DC

## 2014-08-22 MED ORDER — OXYCODONE HCL 10 MG PO TABS: ORAL_TABLET | ORAL | Status: DC

## 2014-08-22 NOTE — Patient Instructions (Signed)
[place] Discharge Instructions  RECOMMENDATIONS MADE BY THE CONSULTANT AND ANY TEST RESULTS WILL BE SENT TO YOUR REFERRING PHYSICIAN.  Prescriptions for pain medications (2) to be filled on 09/06/2014 or later. CT of chest in 4 months to follow-up on pulmonary (lung) nodules and make sure they are stable and not changing.  Your largest nodule is about 7 mm in size.  I provided you a copy of your CT of chest report from 08/20/2014. Labs in 6 weeks Return in 6 weeks for follow-up  Thank you for choosing [facility] to provide your oncology and hematology care.  To afford each patient quality time with our providers, please arrive at least 15 minutes before your scheduled appointment time.  With your help, our goal is to use those 15 minutes to complete the necessary work-up to ensure our physicians have the information they need to help with your evaluation and healthcare recommendations.    Effective January 1st, 2014, we ask that you re-schedule your appointment with our physicians should you arrive 10 or more minutes late for your appointment.  We strive to give you quality time with our providers, and arriving late affects you and other patients whose appointments are after yours.    Again, thank you for choosing [facility].  Our hope is that these requests will decrease the amount of time that you wait before being seen by our physicians.       _____________________________________________________________  Should you have questions after your visit to [facility], please contact our office at [phone] between the hours of 8:30 a.m. and 5:00 p.m.  Voicemails left after 4:30 p.m. will not be returned until the following business day.  For prescription refill requests, have your pharmacy contact our office with your prescription refill request.

## 2014-09-06 ENCOUNTER — Encounter (HOSPITAL_COMMUNITY): Payer: Self-pay | Admitting: Cardiology

## 2014-09-26 ENCOUNTER — Other Ambulatory Visit: Payer: Self-pay | Admitting: Cardiovascular Disease

## 2014-09-26 MED ORDER — PRASUGREL HCL 10 MG PO TABS: 10.0000 mg | ORAL_TABLET | Freq: Every day | ORAL | Status: DC

## 2014-09-26 NOTE — Telephone Encounter (Signed)
Received fax refill request  Rx # [id number] Medication:  Effient 10 mg tablet Qty 30 Sig:  Take one tablet by mouth every day Physician:  Bronson Ing

## 2014-09-27 ENCOUNTER — Other Ambulatory Visit: Payer: Self-pay | Admitting: *Deleted

## 2014-09-27 MED ORDER — PRASUGREL HCL 10 MG PO TABS: 10.0000 mg | ORAL_TABLET | Freq: Every day | ORAL | Status: DC

## 2014-09-30 ENCOUNTER — Encounter (HOSPITAL_COMMUNITY): Payer: Self-pay | Admitting: Oncology

## 2014-09-30 DIAGNOSIS — R918 Other nonspecific abnormal finding of lung field: Secondary | ICD-10-CM | POA: Insufficient documentation

## 2014-09-30 HISTORY — DX: Other nonspecific abnormal finding of lung field: R91.8

## 2014-09-30 NOTE — Assessment & Plan Note (Addendum)
NED.  Labs today: CBC diff, CMET, CEA

## 2014-09-30 NOTE — Assessment & Plan Note (Signed)
Stable.  Undergoing follow-up CT of chest wo contrast in March 2016.

## 2014-09-30 NOTE — Assessment & Plan Note (Addendum)
Secondary to LAR for rectal cancer.  On MS Contin 60 mg every 8 hours and Oxycodone 10 mg 1-2 tablets every 4 hours PRN pain #100 with excellent pain control.  Patient also on bowel regimen to maintain BMs. Three months worth of Rx for aforementioned pain medication provided at today's visit.  Return in 3 months for follow-up.

## 2014-09-30 NOTE — Assessment & Plan Note (Signed)
Secondary to LAR for rectal cancer.  On MS Contin 60 mg every 8 hours and Oxycodone 10 mg 1-2 tablets every 4 hours PRN pain #100 with excellent pain control.  Patient also on bowel regimen to maintain BMs.
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
## 2014-10-03 ENCOUNTER — Encounter (HOSPITAL_BASED_OUTPATIENT_CLINIC_OR_DEPARTMENT_OTHER): Payer: Medicare Other

## 2014-10-03 ENCOUNTER — Encounter (HOSPITAL_COMMUNITY): Payer: Self-pay | Admitting: Oncology

## 2014-10-03 ENCOUNTER — Encounter (HOSPITAL_COMMUNITY): Payer: Medicare Other | Attending: Hematology and Oncology | Admitting: Oncology

## 2014-10-03 VITALS — BP 151/89 | HR 94 | Temp 98.3°F | Resp 18 | Wt 180.3 lb

## 2014-10-03 DIAGNOSIS — G894 Chronic pain syndrome: Secondary | ICD-10-CM | POA: Insufficient documentation

## 2014-10-03 DIAGNOSIS — R911 Solitary pulmonary nodule: Secondary | ICD-10-CM | POA: Diagnosis not present

## 2014-10-03 DIAGNOSIS — C20 Malignant neoplasm of rectum: Secondary | ICD-10-CM | POA: Insufficient documentation

## 2014-10-03 DIAGNOSIS — R918 Other nonspecific abnormal finding of lung field: Secondary | ICD-10-CM

## 2014-10-03 DIAGNOSIS — D7589 Other specified diseases of blood and blood-forming organs: Secondary | ICD-10-CM | POA: Insufficient documentation

## 2014-10-03 DIAGNOSIS — G893 Neoplasm related pain (acute) (chronic): Secondary | ICD-10-CM | POA: Diagnosis not present

## 2014-10-03 DIAGNOSIS — K6289 Other specified diseases of anus and rectum: Secondary | ICD-10-CM

## 2014-10-03 LAB — COMPREHENSIVE METABOLIC PANEL
ALT: 15 U/L (ref 0–53)
AST: 18 U/L (ref 0–37)
Albumin: 3.7 g/dL (ref 3.5–5.2)
Alkaline Phosphatase: 68 U/L (ref 39–117)
Anion gap: 6 (ref 5–15)
BUN: 9 mg/dL (ref 6–23)
CO2: 25 mmol/L (ref 19–32)
Calcium: 8.9 mg/dL (ref 8.4–10.5)
Chloride: 110 mEq/L (ref 96–112)
Creatinine, Ser: 1.16 mg/dL (ref 0.50–1.35)
GFR calc Af Amer: 81 mL/min — ABNORMAL LOW (ref >90)
GFR calc non Af Amer: 70 mL/min — ABNORMAL LOW (ref >90)
Glucose, Bld: 114 mg/dL — ABNORMAL HIGH (ref 70–99)
Potassium: 3.6 mmol/L (ref 3.5–5.1)
Sodium: 141 mmol/L (ref 135–145)
Total Bilirubin: 0.6 mg/dL (ref 0.3–1.2)
Total Protein: 7.1 g/dL (ref 6.0–8.3)

## 2014-10-03 LAB — CBC WITH DIFFERENTIAL/PLATELET
Basophils Absolute: 0 10*3/uL (ref 0.0–0.1)
Basophils Relative: 0 % (ref 0–1)
Eosinophils Absolute: 0.2 10*3/uL (ref 0.0–0.7)
Eosinophils Relative: 3 % (ref 0–5)
HCT: 48.9 % (ref 39.0–52.0)
Hemoglobin: 15.9 g/dL (ref 13.0–17.0)
Lymphocytes Relative: 29 % (ref 12–46)
Lymphs Abs: 2.3 10*3/uL (ref 0.7–4.0)
MCH: 31.9 pg (ref 26.0–34.0)
MCHC: 32.5 g/dL (ref 30.0–36.0)
MCV: 98 fL (ref 78.0–100.0)
Monocytes Absolute: 0.6 10*3/uL (ref 0.1–1.0)
Monocytes Relative: 8 % (ref 3–12)
Neutro Abs: 5 10*3/uL (ref 1.7–7.7)
Neutrophils Relative %: 60 % (ref 43–77)
Platelets: 204 10*3/uL (ref 150–400)
RBC: 4.99 MIL/uL (ref 4.22–5.81)
RDW: 15.4 % (ref 11.5–15.5)
WBC: 8.1 10*3/uL (ref 4.0–10.5)

## 2014-10-03 MED ORDER — OXYCODONE HCL 10 MG PO TABS: 10.0000 mg | ORAL_TABLET | ORAL | Status: DC | PRN

## 2014-10-03 MED ORDER — MORPHINE SULFATE ER 60 MG PO TBCR: 60.0000 mg | EXTENDED_RELEASE_TABLET | Freq: Three times a day (TID) | ORAL | Status: DC

## 2014-10-03 NOTE — Patient Instructions (Signed)
[place] Discharge Instructions  RECOMMENDATIONS MADE BY THE CONSULTANT AND ANY TEST RESULTS WILL BE SENT TO YOUR REFERRING PHYSICIAN.  Labs drawn today.  CT chest in March 2016 to follow-up on lung nodules.  3 sets of prescriptions for MS Contin and Oxycodone to be filled on 10/03/2014, 10/31/2014, and 11/28/2014. Return in 3 months after CT scan to review results and refill pain medications.  Happy New Year.  Thank you for choosing [place] to provide your oncology and hematology care.  To afford each patient quality time with our providers, please arrive at least 15 minutes before your scheduled appointment time.  With your help, our goal is to use those 15 minutes to complete the necessary work-up to ensure our physicians have the information they need to help with your evaluation and healthcare recommendations.    Effective January 1st, 2014, we ask that you re-schedule your appointment with our physicians should you arrive 10 or more minutes late for your appointment.  We strive to give you quality time with our providers, and arriving late affects you and other patients whose appointments are after yours.    Again, thank you for choosing [facility].  Our hope is that these requests will decrease the amount of time that you wait before being seen by our physicians.       _____________________________________________________________  Should you have questions after your visit to [facility], please contact our office at [phone] between the hours of 8:30 a.m. and 5:00 p.m.  Voicemails left after 4:30 p.m. will not be returned until the following business day.  For prescription refill requests, have your pharmacy contact our office with your prescription refill request.

## 2014-10-03 NOTE — Progress Notes (Signed)
LABS FOR CEA,CMP,CBCD

## 2014-10-04 LAB — CEA: CEA: 5 ng/mL — ABNORMAL HIGH (ref 0.0–4.7)

## 2014-10-24 ENCOUNTER — Encounter: Payer: Self-pay | Admitting: Cardiovascular Disease

## 2014-10-24 ENCOUNTER — Ambulatory Visit (INDEPENDENT_AMBULATORY_CARE_PROVIDER_SITE_OTHER): Payer: Medicare Other | Admitting: Cardiovascular Disease

## 2014-10-24 VITALS — BP 130/96 | HR 90 | Ht 69.0 in | Wt 174.0 lb

## 2014-10-24 DIAGNOSIS — I10 Essential (primary) hypertension: Secondary | ICD-10-CM | POA: Diagnosis not present

## 2014-10-24 DIAGNOSIS — I2510 Atherosclerotic heart disease of native coronary artery without angina pectoris: Secondary | ICD-10-CM

## 2014-10-24 DIAGNOSIS — E785 Hyperlipidemia, unspecified: Secondary | ICD-10-CM

## 2014-10-24 DIAGNOSIS — Z716 Tobacco abuse counseling: Secondary | ICD-10-CM

## 2014-10-24 DIAGNOSIS — I739 Peripheral vascular disease, unspecified: Secondary | ICD-10-CM

## 2014-10-24 NOTE — Patient Instructions (Addendum)
Your physician wants you to follow-up in: 6 months with Dr. Bronson Ing. You will receive a reminder letter in the mail two months in advance. If you don't receive a letter, please call our office to schedule the follow-up appointment.  STOP: Effient  Your physician has requested that you have an ankle brachial index (ABI). During this test an ultrasound and blood pressure cuff are used to evaluate the arteries that supply the arms and legs with blood. Allow thirty minutes for this exam. There are no restrictions or special instructions.  Thank you for choosing [facility]!

## 2014-10-24 NOTE — Progress Notes (Signed)
Patient ID: Juan Wheeler, male   DOB: [birth year], 54 y.o.   MRN: [id number]      SUBJECTIVE: The patient is here for routine cardiovascular followup. He has a history of drug-eluting stent placement to the left circumflex coronary artery in July 2014 with moderate to severe obstructive disease in the distal LAD and mild disease in proximal LAD and right coronary artery. He also has hypertension, hyperlipidemia, rectal cancer, history of tobacco abuse, and peripheral vascular disease. He denies chest pain and hasn't had to use SL nitroglycerin since his last visit with me in 7/14. His chronic exertional dyspnea is "about the same". He has had worsening exertional claudication in his left leg and experiences this with 50 ft of walking.  He continues to smoke 1 ppd. Said he overslept this morning and didn't take meds.  Review of Systems: As per "subjective", otherwise negative.  Allergies  Allergen Reactions  . Darvocet [Propoxyphene N-Acetaminophen] Palpitations    Current Outpatient Prescriptions  Medication Sig Dispense Refill  . amLODipine (NORVASC) 5 MG tablet TAKE 1 TABLET (5 MG TOTAL) BY MOUTH DAILY. 30 tablet 6  . aspirin EC 81 MG tablet Take 81 mg by mouth daily.    Marland Kitchen atorvastatin (LIPITOR) 80 MG tablet Take 1 tablet (80 mg total) by mouth every evening. (Patient not taking: Reported on 10/03/2014) 30 tablet 6  . Docusate Calcium (STOOL SOFTENER PO) Take 1 capsule by mouth daily as needed.     . gabapentin (NEURONTIN) 300 MG capsule Take 900-1,200 mg by mouth 3 (three) times daily. Takes 900mg  in the morning, 1200mg  in the afternoon, and 1200mg  in the evening.    . isosorbide mononitrate (IMDUR) 30 MG 24 hr tablet TAKE 1/2 TABLET DAILY 15 tablet 6  . lisinopril (PRINIVIL,ZESTRIL) 40 MG tablet Take 40 mg by mouth daily.     . Loperamide HCl (IMODIUM PO) Take 1 capsule by mouth as needed.    Marland Kitchen morphine (MS CONTIN) 60 MG 12 hr tablet Take 1 tablet (60 mg total) by mouth every 8 (eight)  hours. Take 1 tablet every 8 hours. 90 tablet 0  . NITROSTAT 0.4 MG SL tablet PLACE 1 TABLET UNDER TONGUE IF NEEDED FOR CHEST PAIN..UP TO 3 DOSES 25 tablet 1  . omeprazole (PRILOSEC) 40 MG capsule Take 40 mg by mouth daily as needed (for acid reflux).     Marland Kitchen OVER THE COUNTER MEDICATION Take 1 tablet by mouth as needed. OTC med for itching    . Oxycodone HCl 10 MG TABS Take 1-2 tablets (10-20 mg total) by mouth every 4 (four) hours as needed. Take 1 or 2 tablets every 4 hours as needed for pain.. 100 tablet 0  . prasugrel (EFFIENT) 10 MG TABS tablet Take 1 tablet (10 mg total) by mouth daily. 90 tablet 3   No current facility-administered medications for this visit.    Past Medical History  Diagnosis Date  . Hypertension   . DVT (deep venous thrombosis) ~ 2013  . Rectal cancer   . Coronary artery disease     a. 03/2013: abnl nuc -> LHC s/p DES to LCx, residual moderate disease in LAD (med rx unless refractory angina). b. Not on BB due to bradycardia.  Marland Kitchen History of blood transfusion     "once; after throwing up alot of blood" (04/17/2013)  . GERD (gastroesophageal reflux disease)   . Chronic lower back pain     a. Followed by [facility] management at [facility].  . PAD (peripheral  artery disease)     a. Occlusion of the right internal iliac artery, with significant atherosclerosis in the left internal iliac which was not amenable to reconstruction per notes from [facility].  . LV dysfunction     a. EF 45% in 03/2013.  . Tobacco abuse   . Colon cancer     rectal cancer  . Pulmonary nodules 09/30/2014    Past Surgical History  Procedure Laterality Date  . Colostomy    . Abdominal surgery  1990's    'for stomach ulcers" (04/17/2013)  . Coronary angioplasty with stent placement  04/17/2013    "?1" (04/17/2013)  . Inguinal hernia repair Bilateral 1990's  . Colostomy takedown  2013  . Colectomy  2012    "for rectal cancer" (04/17/2013)  . Left heart catheterization with coronary angiogram N/A  04/17/2013    Procedure: LEFT HEART CATHETERIZATION WITH CORONARY ANGIOGRAM;  Surgeon: Peter M Martinique, MD;  Location: [facility] CATH LAB;  Service: Cardiovascular;  Laterality: N/A;  . Percutaneous stent intervention  04/17/2013    Procedure: PERCUTANEOUS STENT INTERVENTION;  Surgeon: Peter M Martinique, MD;  Location: [facility] CATH LAB;  Service: Cardiovascular;;    History   Social History  . Marital Status: Married    Spouse Name: N/A    Number of Children: N/A  . Years of Education: N/A   Occupational History  . Not on file.   Social History Main Topics  . Smoking status: Current Every Day Smoker -- 1.00 packs/day for 40 years    Types: Cigarettes    Start date: 03/14/1974  . Smokeless tobacco: Current User  . Alcohol Use: 3.6 oz/week    6 Cans of beer per week     Comment: 04/17/2013 "bout a 6 pack/wk"  . Drug Use: Yes    Special: Marijuana     Comment: about a week ago  . Sexual Activity: Yes    Birth Control/ Protection: None   Other Topics Concern  . Not on file   Social History Narrative     Filed Vitals:   10/24/14 0824  Height: 5\' 9"  (1.753 m)  Weight: 174 lb (78.926 kg)   BP 130/96  Pulse 90  SpO2 95%   PHYSICAL EXAM General: NAD HEENT: Normal. Neck: No JVD, no thyromegaly. Lungs: Clear to auscultation bilaterally with normal respiratory effort. CV: Nondisplaced PMI.  Regular rate and rhythm, normal S1/S2, no S3/+S4, no murmur. No pretibial or periankle edema.  No carotid bruit.  Diminished pedal pulses b/ but warm extremities.  Abdomen: Soft, nontender,no distention.  Neurologic: Alert and oriented x 3.  Psych: Normal affect. Skin: Normal. Musculoskeletal: Normal range of motion, no gross deformities. Extremities: No clubbing or cyanosis.   ECG: Most recent ECG reviewed.      ASSESSMENT AND PLAN: 1. CAD: Stable ischemic heart disease. Continue ASA, Lipitor, and Imdur. Will d/c Effient as it has been over one year since stent placement. 2. Essential HTN:  Elevated DBP on present therapy which includes lisinopril and amlodipine. Will monitor for now. Says he overslept, rushed to get to office, and didn't take meds. 3. Hyperlipidemia: LDL 96, HDL 45 in 7/14. On high dose Lipitor 80 mg daily. Will repeat lipids/LFT's in 6 months. 4. Tobacco abuse: Extensive counseling again provided with respect to cessation. I do not believe he will quit.  5. PVD: Worsening exertional claudication in left leg. Will obtain ABI's. Continue ASA and statin. Tobacco cessation is paramount.   Dispo: f/u 6 months.  Kate Sable, M.D., F.A.C.C.

## 2014-10-24 NOTE — Addendum Note (Signed)
Addended by: Levonne Hubert on: 10/24/2014 09:00 AM   Modules accepted: Orders

## 2014-11-02 ENCOUNTER — Ambulatory Visit (HOSPITAL_COMMUNITY)
Admission: RE | Admit: 2014-11-02 | Discharge: 2014-11-02 | Disposition: A | Discharge status: Home / Self Care | Payer: Medicare Other | Source: Ambulatory Visit | Attending: Cardiovascular Disease | Admitting: Cardiovascular Disease

## 2014-11-02 DIAGNOSIS — I739 Peripheral vascular disease, unspecified: Secondary | ICD-10-CM | POA: Diagnosis not present

## 2014-11-02 DIAGNOSIS — M79606 Pain in leg, unspecified: Secondary | ICD-10-CM | POA: Diagnosis present

## 2014-11-02 DIAGNOSIS — I743 Embolism and thrombosis of arteries of the lower extremities: Secondary | ICD-10-CM | POA: Diagnosis not present

## 2014-11-05 ENCOUNTER — Telehealth: Payer: Self-pay | Admitting: *Deleted

## 2014-11-05 DIAGNOSIS — I739 Peripheral vascular disease, unspecified: Secondary | ICD-10-CM

## 2014-11-05 NOTE — Telephone Encounter (Signed)
-----   Message from Herminio Commons, MD sent at 11/02/2014  2:05 PM EST ----- Please arrange for f/u with vascular surgery.

## 2014-11-05 NOTE — Telephone Encounter (Signed)
Orders in for f/u with vascular surgery. Forwarded to Dr. [name]. LM for pt.

## 2014-11-06 ENCOUNTER — Telehealth: Payer: Self-pay | Admitting: *Deleted

## 2014-11-06 NOTE — Telephone Encounter (Signed)
Pt aware.

## 2014-11-06 NOTE — Telephone Encounter (Signed)
-----   Message from Chanda Busing sent at 11/06/2014 10:34 AM EST ----- [facility] has been contacted.   ----- Message -----    From: Massie Maroon, CMA    Sent: 11/05/2014   8:46 AM      To: Chanda Busing  Per Dr. Bronson Ing, pt is to f/u with vascular surgery. I have put orders in and LM for pt. Thanks  [employer]

## 2014-11-09 ENCOUNTER — Other Ambulatory Visit: Payer: Self-pay | Admitting: *Deleted

## 2014-11-09 DIAGNOSIS — I739 Peripheral vascular disease, unspecified: Secondary | ICD-10-CM

## 2014-12-05 ENCOUNTER — Encounter: Payer: Self-pay | Admitting: Vascular Surgery

## 2014-12-06 ENCOUNTER — Encounter: Payer: Medicare Other | Admitting: Vascular Surgery

## 2014-12-06 ENCOUNTER — Other Ambulatory Visit (HOSPITAL_COMMUNITY): Payer: Medicare Other

## 2014-12-06 ENCOUNTER — Encounter (HOSPITAL_COMMUNITY): Payer: Medicare Other

## 2014-12-21 ENCOUNTER — Ambulatory Visit (HOSPITAL_COMMUNITY)
Admission: RE | Admit: 2014-12-21 | Discharge: 2014-12-21 | Disposition: A | Discharge status: Home / Self Care | Payer: Medicare Other | Source: Ambulatory Visit | Attending: Oncology | Admitting: Oncology

## 2014-12-21 DIAGNOSIS — I2510 Atherosclerotic heart disease of native coronary artery without angina pectoris: Secondary | ICD-10-CM | POA: Diagnosis not present

## 2014-12-21 DIAGNOSIS — R918 Other nonspecific abnormal finding of lung field: Secondary | ICD-10-CM | POA: Insufficient documentation

## 2014-12-21 DIAGNOSIS — Z87891 Personal history of nicotine dependence: Secondary | ICD-10-CM | POA: Insufficient documentation

## 2014-12-21 DIAGNOSIS — C20 Malignant neoplasm of rectum: Secondary | ICD-10-CM

## 2014-12-21 DIAGNOSIS — R9389 Abnormal findings on diagnostic imaging of other specified body structures: Secondary | ICD-10-CM

## 2014-12-21 DIAGNOSIS — Z85048 Personal history of other malignant neoplasm of rectum, rectosigmoid junction, and anus: Secondary | ICD-10-CM | POA: Diagnosis not present

## 2014-12-21 DIAGNOSIS — Z720 Tobacco use: Secondary | ICD-10-CM

## 2014-12-25 ENCOUNTER — Encounter (HOSPITAL_COMMUNITY): Payer: Self-pay | Admitting: Oncology

## 2014-12-25 ENCOUNTER — Encounter (HOSPITAL_COMMUNITY): Payer: Medicare Other | Attending: Hematology and Oncology | Admitting: Oncology

## 2014-12-25 VITALS — BP 141/72 | HR 76 | Temp 98.4°F | Resp 16 | Wt 179.0 lb

## 2014-12-25 DIAGNOSIS — C20 Malignant neoplasm of rectum: Secondary | ICD-10-CM | POA: Diagnosis not present

## 2014-12-25 DIAGNOSIS — K6289 Other specified diseases of anus and rectum: Secondary | ICD-10-CM

## 2014-12-25 DIAGNOSIS — R911 Solitary pulmonary nodule: Secondary | ICD-10-CM | POA: Diagnosis not present

## 2014-12-25 DIAGNOSIS — G894 Chronic pain syndrome: Secondary | ICD-10-CM

## 2014-12-25 DIAGNOSIS — G893 Neoplasm related pain (acute) (chronic): Secondary | ICD-10-CM | POA: Diagnosis not present

## 2014-12-25 DIAGNOSIS — D7589 Other specified diseases of blood and blood-forming organs: Secondary | ICD-10-CM | POA: Insufficient documentation

## 2014-12-25 DIAGNOSIS — R918 Other nonspecific abnormal finding of lung field: Secondary | ICD-10-CM

## 2014-12-25 MED ORDER — OXYCODONE HCL 10 MG PO TABS: 10.0000 mg | ORAL_TABLET | ORAL | Status: DC | PRN

## 2014-12-25 MED ORDER — MORPHINE SULFATE ER 60 MG PO TBCR: 60.0000 mg | EXTENDED_RELEASE_TABLET | Freq: Three times a day (TID) | ORAL | Status: DC

## 2014-12-25 MED ORDER — OXYCODONE HCL 10 MG PO TABS
10.0000 mg | ORAL_TABLET | ORAL | Status: DC | PRN
Start: 2014-12-25 — End: 2015-03-20
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
## 2014-12-25 NOTE — Patient Instructions (Signed)
.  [facility] Discharge Instructions  RECOMMENDATIONS MADE BY THE CONSULTANT AND ANY TEST RESULTS WILL BE SENT TO YOUR REFERRING PHYSICIAN.  Pain prescription refills given For 3 months supply Labs and return in 3 months  Thank you for choosing [place] [facility] to provide your oncology and hematology care.  To afford each patient quality time with our provider, please arrive at least 15 minutes before your scheduled appointment time.    You need to re-schedule your appointment should you arrive 10 or more minutes late.  We strive to give you quality time with our providers, and arriving late affects you and other patients whose appointments are after yours.  Also, if you no show three or more times for appointments you may be dismissed from the clinic at the providers discretion.     Again, thank you for choosing [facility].  Our hope is that these requests will decrease the amount of time that you wait before being seen by our physicians.       _____________________________________________________________  Should you have questions after your visit to [facility], please contact our office at [phone] between the hours of 8:30 a.m. and 4:30 p.m.  Voicemails left after 4:30 p.m. will not be returned until the following business day.  For prescription refill requests, have your pharmacy contact our office.

## 2014-12-25 NOTE — Assessment & Plan Note (Signed)
Secondary to LAR for rectal cancer.  On MS Contin 60 mg every 8 hours and Oxycodone 10 mg 1-2 tablets every 4 hours PRN pain #100 with excellent pain control.  Patient also on bowel regimen to maintain BMs. Three months worth of Rx for aforementioned pain medication provided at today's visit.  Return in 3 months for follow-up.

## 2014-12-25 NOTE — Assessment & Plan Note (Signed)
Stable.  CT chest on 12/24/2014 demonstrates stability of pulmonary nodules.  Will repeat in 12 months.

## 2014-12-25 NOTE — Assessment & Plan Note (Signed)
Secondary to LAR for rectal cancer.  On MS Contin 60 mg every 8 hours and Oxycodone 10 mg 1-2 tablets every 4 hours PRN pain #100 with excellent pain control.  Patient also on bowel regimen to maintain BMs.

## 2014-12-25 NOTE — Assessment & Plan Note (Addendum)
NED.  Labs in 3 months: CBC diff, CMET, CEA

## 2015-01-02 ENCOUNTER — Ambulatory Visit (HOSPITAL_COMMUNITY): Payer: Medicare Other | Admitting: Oncology

## 2015-01-09 ENCOUNTER — Encounter: Payer: Self-pay | Admitting: Vascular Surgery

## 2015-01-10 ENCOUNTER — Encounter: Payer: Self-pay | Admitting: Vascular Surgery

## 2015-01-10 ENCOUNTER — Ambulatory Visit (INDEPENDENT_AMBULATORY_CARE_PROVIDER_SITE_OTHER): Payer: Medicare Other | Admitting: Vascular Surgery

## 2015-01-10 ENCOUNTER — Ambulatory Visit (HOSPITAL_COMMUNITY)
Admission: RE | Admit: 2015-01-10 | Discharge: 2015-01-10 | Disposition: A | Discharge status: Home / Self Care | Payer: Medicare Other | Source: Ambulatory Visit | Attending: Vascular Surgery | Admitting: Vascular Surgery

## 2015-01-10 VITALS — BP 124/74 | HR 79 | Ht 69.0 in | Wt 178.0 lb

## 2015-01-10 DIAGNOSIS — F17210 Nicotine dependence, cigarettes, uncomplicated: Secondary | ICD-10-CM

## 2015-01-10 DIAGNOSIS — I739 Peripheral vascular disease, unspecified: Secondary | ICD-10-CM | POA: Insufficient documentation

## 2015-01-10 DIAGNOSIS — I2510 Atherosclerotic heart disease of native coronary artery without angina pectoris: Secondary | ICD-10-CM | POA: Diagnosis not present

## 2015-01-10 NOTE — Progress Notes (Signed)
HISTORY AND PHYSICAL     CC:  Left leg pain Referring Provider:  [name], MD  HPI: This is a 54 y.o. male who presents today with complaints of left leg calf pain with walking that has worsened over the past year.  He states that a year ago, he could walk ~ 0.25 miles and now he can only walk a city block without having to stop and rest.  At that time, his pain improves and he continues to walk.  He does have burning in both feet at night if the covers are too tight on his feet.  He does not have any pain in the right leg.  He states that he was evaluated at [facility] for this and could not get stents in, however, he does not recall having an arteriogram.  He states that he continues to smoke a little less than a pack of cigarettes per day.  He expresses that he would like to quit and has thought about it.  He has smoked for about 40 years.  His wife continues to smoke a pack of cigarettes, which will last her about 3-4 days.    He does have a hx of drug eluding coronary artery stenting in 2014 by Dr. Martinique.  He does have a hx of rectal cancer that was diagnosed in 2012 and did have radiation, chemotherapy and surgery.  He continues to have chronic pain with this and is on disability.  He states that for the most part, he does have normal bowel movements, but does need occasional stool softeners.  He did have an exploratory laparotomy ~ 25-30 years ago for ulcer disease.  He is on a daily aspirin. He does take an ACEI for his HTN.  He is on a statin for his cholesterol.  Past Medical History  Diagnosis Date  . Hypertension   . DVT (deep venous thrombosis) ~ 2013  . Rectal cancer   . Coronary artery disease     a. 03/2013: abnl nuc -> LHC s/p DES to LCx, residual moderate disease in LAD (med rx unless refractory angina). b. Not on BB due to bradycardia.  Marland Kitchen History of blood transfusion     "once; after throwing up alot of blood" (04/17/2013)  . GERD (gastroesophageal reflux  disease)   . Chronic lower back pain     a. Followed by pain management at [facility].  . PAD (peripheral artery disease)     a. Occlusion of the right internal iliac artery, with significant atherosclerosis in the left internal iliac which was not amenable to reconstruction per notes from [facility].  . LV dysfunction     a. EF 45% in 03/2013.  . Tobacco abuse   . Colon cancer     rectal cancer  . Pulmonary nodules 09/30/2014    Past Surgical History  Procedure Laterality Date  . Colostomy    . Abdominal surgery  1990's    'for stomach ulcers" (04/17/2013)  . Coronary angioplasty with stent placement  04/17/2013    "?1" (04/17/2013)  . Inguinal hernia repair Bilateral 1990's  . Colostomy takedown  2013  . Colectomy  2012    "for rectal cancer" (04/17/2013)  . Left heart catheterization with coronary angiogram N/A 04/17/2013    Procedure: LEFT HEART CATHETERIZATION WITH CORONARY ANGIOGRAM;  Surgeon: Peter M Martinique, MD;  Location: [facility] CATH LAB;  Service: Cardiovascular;  Laterality: N/A;  . Percutaneous stent intervention  04/17/2013    Procedure: PERCUTANEOUS STENT  INTERVENTION;  Surgeon: Peter M Martinique, MD;  Location: [facility] CATH LAB;  Service: Cardiovascular;;    Allergies  Allergen Reactions  . Darvocet [Propoxyphene N-Acetaminophen] Palpitations    Current Outpatient Prescriptions  Medication Sig Dispense Refill  . amLODipine (NORVASC) 5 MG tablet TAKE 1 TABLET (5 MG TOTAL) BY MOUTH DAILY. 30 tablet 6  . aspirin EC 81 MG tablet Take 81 mg by mouth daily.    Mariane Baumgarten Calcium (STOOL SOFTENER PO) Take 1 capsule by mouth daily as needed.     . isosorbide mononitrate (IMDUR) 30 MG 24 hr tablet TAKE 1/2 TABLET DAILY 15 tablet 6  . lisinopril (PRINIVIL,ZESTRIL) 40 MG tablet Take 40 mg by mouth daily.     . Loperamide HCl (IMODIUM PO) Take 1 capsule by mouth as needed.    Marland Kitchen morphine (MS CONTIN) 60 MG 12 hr tablet Take 1 tablet (60 mg total) by mouth every 8 (eight) hours. Take 1 tablet  every 8 hours. 90 tablet 0  . NITROSTAT 0.4 MG SL tablet PLACE 1 TABLET UNDER TONGUE IF NEEDED FOR CHEST PAIN..UP TO 3 DOSES 25 tablet 1  . omeprazole (PRILOSEC) 40 MG capsule Take 40 mg by mouth daily as needed (for acid reflux).     . Oxycodone HCl 10 MG TABS Take 1-2 tablets (10-20 mg total) by mouth every 4 (four) hours as needed. Take 1 or 2 tablets every 4 hours as needed for pain.. 100 tablet 0  . atorvastatin (LIPITOR) 80 MG tablet Take 1 tablet (80 mg total) by mouth every evening. (Patient not taking: Reported on 01/10/2015) 30 tablet 6  . gabapentin (NEURONTIN) 300 MG capsule Take 900-1,200 mg by mouth 3 (three) times daily. Takes 900mg  in the morning, 1200mg  in the afternoon, and 1200mg  in the evening.    Marland Kitchen OVER THE COUNTER MEDICATION Take 1 tablet by mouth as needed. OTC med for itching     No current facility-administered medications for this visit.    Family History  Problem Relation Age of Onset  . Cancer Mother   . Hypertension Mother   . Bleeding Disorder Brother     History   Social History  . Marital Status: Married    Spouse Name: N/A  . Number of Children: N/A  . Years of Education: N/A   Occupational History  . Not on file.   Social History Main Topics  . Smoking status: Current Every Day Smoker -- 1.00 packs/day for 40 years    Types: Cigarettes    Start date: 03/14/1974  . Smokeless tobacco: Current User  . Alcohol Use: 4.2 oz/week    6 Cans of beer, 1 Shots of liquor per week     Comment: 04/17/2013 "bout a 6 pack/wk"  . Drug Use: Yes    Special: Marijuana     Comment: about a week ago  . Sexual Activity: Yes    Birth Control/ Protection: None   Other Topics Concern  . Not on file   Social History Narrative     ROS: [x]  Positive   [ ]  Negative   [ ]  All sytems reviewed and are negative  Cardiovascular: []  chest pain/pressure []  palpitations []  SOB lying flat []  DOE [x]  pain in left leg while walking [x]  buring in feet when lying flat  if covers are too tight  [x]  hx of DVT []  hx of phlebitis []  swelling in legs []  varicose veins  Pulmonary: []  productive cough []  asthma []  wheezing  Neurologic: [x]  weakness  in []  arms [x]  left leg []  numbness in []  arms []  legs [] difficulty speaking or slurred speech []  temporary loss of vision in one eye []  dizziness  Hematologic: []  bleeding problems []  problems with blood clotting easily [x]  hx of rectal cancer  GI []  vomiting blood []  blood in stool [x]  hx of ulcers  GU: []  burning with urination []  blood in urine  Psychiatric: []  hx of major depression  Integumentary: []  rashes []  ulcers  Constitutional: []  fever []  chills   PHYSICAL EXAMINATION:  Filed Vitals:   01/10/15 1448  BP: 124/74  Pulse: 79   Body mass index is 26.27 kg/(m^2).  General:  WDWN in NAD Gait: Not observed HENT: WNL, normocephalic Pulmonary: normal non-labored breathing with distant breath sounds bilaterally Cardiac: RRR, without  Murmurs, rubs or gallops; without carotid bruits Abdomen: soft, NT, no masses; well healed laparotomy scar Skin: without rashes, without ulcers  Vascular Exam/Pulses:  Right Left  Femoral 2+ (normal) 2+ (normal)  Popliteal 2+ (normal) absent  DP 2+ (normal) absent  PT 2+ (normal) absent   Extremities: without ischemic changes, without Gangrene , without cellulitis; without open wounds;  Musculoskeletal: no muscle wasting or atrophy  Neurologic: A&O X 3; Appropriate Affect ; SENSATION: normal; MOTOR FUNCTION:  moving all extremities equally. Speech is fluent/normal   Non-Invasive Vascular Imaging:   ABI's 01/10/15: Right:  1.09 Left:  0.65  No flow noted in the left mid and distal SFA.  Minimal arterial flow noted in the proximal segment.  Patent left popliteal artery with monophasic flow.   Pt meds includes: Statin:  Yes.   Beta Blocker:  No. Aspirin:  Yes.   ACEI:  Yes.   ARB:  No. Other Antiplatelet/Anticoagulant:  No.     ASSESSMENT/PLAN:: 54 y.o. male with left leg claudication that has worsened significantly over the past year   -the pt did have an arteriogram at [facility] in July 2014, which revealed several stenoses of the left SFA, however, on arterial duplex today, the SFA (left) is occluded.   -the pt was counseled greater than 3 minutes on smoking cessation.  The pt has thought about and does have the desire to quit.   -he is offered an arteriogram, however, he would like to discuss this with his wife.  We will have him scheduled to come back in 6 months for ABI's.  If he wishes to proceed sooner or worsens, he will return to see [place] sooner.      Leontine Locket, [name] [facility] [phone]  Clinic MD:  Pt seen and examined in conjunction with Dr. Oneida Alar  History and exam findings as above. Patient has mild claudication symptoms left leg. He has adequate perfusion to the left leg. We were able to obtain the report of his previous arteriogram [facility]. This showed a small diffusely diseased left superficial femoral artery. On our duplex scan his left superficial femoral artery is now occluded. I emphasized smoking cessation the patient today as well as a walking program. He will follow-up with [place] in 6 months time and was wishes to consider an intervention. He'll call sooner if he wishes to proceed on that course.  Ruta Hinds, MD [facility] [place] Office: [phone] Pager: [phone]

## 2015-01-23 ENCOUNTER — Ambulatory Visit (HOSPITAL_COMMUNITY): Payer: Medicare Other | Admitting: Oncology

## 2015-01-30 ENCOUNTER — Other Ambulatory Visit: Payer: Self-pay

## 2015-01-30 MED ORDER — NITROGLYCERIN 0.4 MG SL SUBL: SUBLINGUAL_TABLET | SUBLINGUAL | Status: DC

## 2015-01-31 ENCOUNTER — Other Ambulatory Visit: Payer: Self-pay

## 2015-01-31 MED ORDER — NITROGLYCERIN 0.4 MG SL SUBL: SUBLINGUAL_TABLET | SUBLINGUAL | Status: DC

## 2015-01-31 NOTE — Telephone Encounter (Signed)
NTG refilled 

## 2015-02-01 ENCOUNTER — Other Ambulatory Visit: Payer: Self-pay | Admitting: Cardiovascular Disease

## 2015-02-01 MED ORDER — NITROGLYCERIN 0.4 MG SL SUBL: SUBLINGUAL_TABLET | SUBLINGUAL | Status: DC

## 2015-02-01 NOTE — Telephone Encounter (Signed)
Received fax refill request  Rx # [id number] Medication:  Nitrostat 0.4 mg tablet SL Qty 25 Sig:  Place one tablet under tongue if needed for chest pain.Marland Kitchen Up to 3 doses Physician:  Bronson Ing

## 2015-03-01 ENCOUNTER — Other Ambulatory Visit: Payer: Self-pay

## 2015-03-01 MED ORDER — AMLODIPINE BESYLATE 5 MG PO TABS: ORAL_TABLET | ORAL | Status: DC

## 2015-03-05 ENCOUNTER — Other Ambulatory Visit: Payer: Self-pay

## 2015-03-05 MED ORDER — AMLODIPINE BESYLATE 5 MG PO TABS
ORAL_TABLET | ORAL | Status: DC
Start: 2015-03-05 — End: 2015-03-06

## 2015-03-06 ENCOUNTER — Other Ambulatory Visit: Payer: Self-pay

## 2015-03-06 MED ORDER — AMLODIPINE BESYLATE 5 MG PO TABS: ORAL_TABLET | ORAL | Status: DC

## 2015-03-07 ENCOUNTER — Other Ambulatory Visit: Payer: Self-pay | Admitting: *Deleted

## 2015-03-07 MED ORDER — AMLODIPINE BESYLATE 5 MG PO TABS: ORAL_TABLET | ORAL | Status: DC

## 2015-03-20 ENCOUNTER — Other Ambulatory Visit (HOSPITAL_COMMUNITY): Payer: Self-pay | Admitting: Oncology

## 2015-03-20 DIAGNOSIS — G894 Chronic pain syndrome: Secondary | ICD-10-CM

## 2015-03-20 DIAGNOSIS — C20 Malignant neoplasm of rectum: Secondary | ICD-10-CM

## 2015-03-20 MED ORDER — MORPHINE SULFATE ER 60 MG PO TBCR: 60.0000 mg | EXTENDED_RELEASE_TABLET | Freq: Three times a day (TID) | ORAL | Status: DC

## 2015-03-20 MED ORDER — OXYCODONE HCL 10 MG PO TABS: 10.0000 mg | ORAL_TABLET | ORAL | Status: DC | PRN

## 2015-03-27 ENCOUNTER — Encounter (HOSPITAL_COMMUNITY): Payer: Medicare Other | Attending: Oncology

## 2015-03-27 ENCOUNTER — Encounter (HOSPITAL_COMMUNITY): Payer: Self-pay | Admitting: Oncology

## 2015-03-27 ENCOUNTER — Ambulatory Visit (HOSPITAL_COMMUNITY): Payer: Medicare Other | Admitting: Oncology

## 2015-03-27 ENCOUNTER — Encounter (HOSPITAL_BASED_OUTPATIENT_CLINIC_OR_DEPARTMENT_OTHER): Payer: Medicare Other | Admitting: Oncology

## 2015-03-27 VITALS — BP 122/75 | HR 62 | Temp 97.9°F | Resp 16 | Wt 176.3 lb

## 2015-03-27 DIAGNOSIS — C20 Malignant neoplasm of rectum: Secondary | ICD-10-CM | POA: Diagnosis not present

## 2015-03-27 DIAGNOSIS — G894 Chronic pain syndrome: Secondary | ICD-10-CM | POA: Diagnosis not present

## 2015-03-27 DIAGNOSIS — R918 Other nonspecific abnormal finding of lung field: Secondary | ICD-10-CM | POA: Diagnosis not present

## 2015-03-27 LAB — COMPREHENSIVE METABOLIC PANEL
ALT: 12 U/L — ABNORMAL LOW (ref 17–63)
AST: 16 U/L (ref 15–41)
Albumin: 3.5 g/dL (ref 3.5–5.0)
Alkaline Phosphatase: 66 U/L (ref 38–126)
Anion gap: 7 (ref 5–15)
BUN: 11 mg/dL (ref 6–20)
CO2: 26 mmol/L (ref 22–32)
Calcium: 8.6 mg/dL — ABNORMAL LOW (ref 8.9–10.3)
Chloride: 105 mmol/L (ref 101–111)
Creatinine, Ser: 1.23 mg/dL (ref 0.61–1.24)
GFR calc Af Amer: >60 mL/min (ref >60)
GFR calc non Af Amer: >60 mL/min (ref >60)
Glucose, Bld: 111 mg/dL — ABNORMAL HIGH (ref 65–99)
Potassium: 4.3 mmol/L (ref 3.5–5.1)
Sodium: 138 mmol/L (ref 135–145)
Total Bilirubin: 0.7 mg/dL (ref 0.3–1.2)
Total Protein: 7.3 g/dL (ref 6.5–8.1)

## 2015-03-27 LAB — CBC WITH DIFFERENTIAL/PLATELET
Basophils Absolute: 0 10*3/uL (ref 0.0–0.1)
Basophils Relative: 0 % (ref 0–1)
Eosinophils Absolute: 0.2 10*3/uL (ref 0.0–0.7)
Eosinophils Relative: 3 % (ref 0–5)
HCT: 51.3 % (ref 39.0–52.0)
Hemoglobin: 17.6 g/dL — ABNORMAL HIGH (ref 13.0–17.0)
Lymphocytes Relative: 26 % (ref 12–46)
Lymphs Abs: 1.8 10*3/uL (ref 0.7–4.0)
MCH: 33.6 pg (ref 26.0–34.0)
MCHC: 34.3 g/dL (ref 30.0–36.0)
MCV: 97.9 fL (ref 78.0–100.0)
Monocytes Absolute: 0.5 10*3/uL (ref 0.1–1.0)
Monocytes Relative: 7 % (ref 3–12)
Neutro Abs: 4.2 10*3/uL (ref 1.7–7.7)
Neutrophils Relative %: 64 % (ref 43–77)
Platelets: 193 10*3/uL (ref 150–400)
RBC: 5.24 MIL/uL (ref 4.22–5.81)
RDW: 15 % (ref 11.5–15.5)
WBC: 6.7 10*3/uL (ref 4.0–10.5)

## 2015-03-27 MED ORDER — MORPHINE SULFATE ER 60 MG PO TBCR: 60.0000 mg | EXTENDED_RELEASE_TABLET | Freq: Three times a day (TID) | ORAL | Status: DC

## 2015-03-27 MED ORDER — OXYCODONE HCL 10 MG PO TABS: 10.0000 mg | ORAL_TABLET | ORAL | Status: DC | PRN
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
## 2015-03-27 NOTE — Assessment & Plan Note (Signed)
NED.    Labs today: CBC diff, CMET, CEA  Repeat labs in 6 months: CBC diff, CMET, CEA.

## 2015-03-27 NOTE — Patient Instructions (Signed)
[place] at [facility] Discharge Instructions  RECOMMENDATIONS MADE BY THE CONSULTANT AND ANY TEST RESULTS WILL BE SENT TO YOUR REFERRING PHYSICIAN.  Exam and discussion by Robynn Pane, [name] Try ZZZquil or melatonin for sleep.  If not effective, contact Dr. [name]. Call with any concerns. Refills for pain medications. Follow-up in 3 months.  Thank you for choosing [place] at [facility] to provide your oncology and hematology care.  To afford each patient quality time with our provider, please arrive at least 15 minutes before your scheduled appointment time.    You need to re-schedule your appointment should you arrive 10 or more minutes late.  We strive to give you quality time with our providers, and arriving late affects you and other patients whose appointments are after yours.  Also, if you no show three or more times for appointments you may be dismissed from the clinic at the providers discretion.     Again, thank you for choosing [facility].  Our hope is that these requests will decrease the amount of time that you wait before being seen by our physicians.       _____________________________________________________________  Should you have questions after your visit to [facility], please contact our office at [phone] between the hours of 8:30 a.m. and 4:30 p.m.  Voicemails left after 4:30 p.m. will not be returned until the following business day.  For prescription refill requests, have your pharmacy contact our office.

## 2015-03-27 NOTE — Assessment & Plan Note (Addendum)
Secondary to LAR for rectal cancer.    On MS Contin 60 mg every 8 hours and Oxycodone 10 mg 1-2 tablets every 4 hours PRN pain #100 with excellent pain control.    Patient also on bowel regimen to maintain BMs and avoid constipation.  Three months worth of Rx for aforementioned pain medication provided at today's visit.    Return in 3 months for follow-up.  He is having difficulty with sleeping and he notes Benadryl was ineffective as a sleeping aid.  I have recommended ZZZquil or Melatonin.  If ineffective, I have recommended follow-up with his primary care provider to address this issue.

## 2015-03-27 NOTE — Progress Notes (Signed)
Labs drawn

## 2015-03-27 NOTE — Assessment & Plan Note (Signed)
Stable.  CT chest on 12/24/2014 demonstrates stability of pulmonary nodules.  Will repeat in 12 months.  

## 2015-03-28 LAB — CEA: CEA: 5.1 ng/mL — ABNORMAL HIGH (ref 0.0–4.7)

## 2015-04-22 ENCOUNTER — Other Ambulatory Visit: Payer: Self-pay

## 2015-04-22 MED ORDER — ISOSORBIDE MONONITRATE ER 30 MG PO TB24: 15.0000 mg | ORAL_TABLET | Freq: Every day | ORAL | Status: DC

## 2015-04-24 ENCOUNTER — Other Ambulatory Visit: Payer: Self-pay

## 2015-04-24 MED ORDER — ISOSORBIDE MONONITRATE ER 30 MG PO TB24: 15.0000 mg | ORAL_TABLET | Freq: Every day | ORAL | Status: DC

## 2015-05-13 ENCOUNTER — Encounter: Payer: Self-pay | Admitting: Cardiovascular Disease

## 2015-05-13 ENCOUNTER — Ambulatory Visit (INDEPENDENT_AMBULATORY_CARE_PROVIDER_SITE_OTHER): Payer: Medicare Other | Admitting: Cardiovascular Disease

## 2015-05-13 VITALS — BP 128/88 | HR 91 | Ht 69.0 in | Wt 172.0 lb

## 2015-05-13 DIAGNOSIS — I10 Essential (primary) hypertension: Secondary | ICD-10-CM

## 2015-05-13 DIAGNOSIS — I739 Peripheral vascular disease, unspecified: Secondary | ICD-10-CM

## 2015-05-13 DIAGNOSIS — E785 Hyperlipidemia, unspecified: Secondary | ICD-10-CM

## 2015-05-13 DIAGNOSIS — Z716 Tobacco abuse counseling: Secondary | ICD-10-CM

## 2015-05-13 DIAGNOSIS — I2510 Atherosclerotic heart disease of native coronary artery without angina pectoris: Secondary | ICD-10-CM | POA: Diagnosis not present

## 2015-05-13 NOTE — Addendum Note (Signed)
Addended byDebbora Lacrosse R on: 05/13/2015 12:21 PM   Modules accepted: Orders

## 2015-05-13 NOTE — Patient Instructions (Addendum)
Your physician wants you to follow-up in: [place]. Bronson Ing. You will receive a reminder letter in the mail two months in advance. If you don't receive a letter, please call our office to schedule the follow-up appointment.  Your physician recommends that you continue on your current medications as directed. Please refer to the Current Medication list given to you today.  You have been referred to DR. FIELDS  Your physician recommends that you return for lab work BEFORE NEXT VISIT : LIPIDS - FASTING ( NO EATING OR DRINKING 6-8 HOURS BEFORE LAB WORK)   Thanks for choosing [facility]!!!

## 2015-05-13 NOTE — Progress Notes (Signed)
Patient ID: Juan Wheeler, male   DOB: [birth year], 54 y.o.   MRN: [id number]      SUBJECTIVE: The patient is here for routine cardiovascular followup. He has a history of drug-eluting stent placement to the left circumflex coronary artery in July 2014 with moderate to severe obstructive disease in the distal LAD and mild disease in proximal LAD and right coronary artery. He also has hypertension, hyperlipidemia, rectal cancer, history of tobacco abuse, and peripheral vascular disease.  He denies chest pain. I previously referred him to vascular surgery for exertional claudication.   ABIs on 01/10/2015 demonstrated no flow in the left mid and distal superficial femoral artery with minimal arterial flow noted in the proximal segment. The left popliteal artery was patent with monophasic flow.  He deferred having an arteriogram by vascular surgery and is scheduled for repeat ABI's.  He continues to experience left leg claudication. He does not remember being offered an arteriogram. He denies chest pain and said he has had one episode of chest pain relieved with one sublingual nitroglycerin tablet in the past 6 months. He said quitting smoking is difficult.  He continues to smoke 1 ppd.  ECG performed in the office today demonstrates normal sinus rhythm with no ischemic ST segment or T-wave abnormalities.   Review of Systems: As per "subjective", otherwise negative.  Allergies  Allergen Reactions  . Darvocet [Propoxyphene N-Acetaminophen] Palpitations    Current Outpatient Prescriptions  Medication Sig Dispense Refill  . amLODipine (NORVASC) 5 MG tablet TAKE 1 TABLET (5 MG TOTAL) BY MOUTH DAILY. (Patient not taking: Reported on 03/27/2015) 30 tablet 6  . aspirin EC 81 MG tablet Take 81 mg by mouth daily.    Marland Kitchen atorvastatin (LIPITOR) 80 MG tablet Take 1 tablet (80 mg total) by mouth every evening. (Patient not taking: Reported on 01/10/2015) 30 tablet 6  . Docusate Calcium (STOOL SOFTENER PO)  Take 1 capsule by mouth daily as needed.     . gabapentin (NEURONTIN) 300 MG capsule Take 900-1,200 mg by mouth 3 (three) times daily. Takes '900mg'$  in the morning, '1200mg'$  in the afternoon, and '1200mg'$  in the evening.    . isosorbide mononitrate (IMDUR) 30 MG 24 hr tablet Take 0.5 tablets (15 mg total) by mouth daily. 15 tablet 6  . lisinopril (PRINIVIL,ZESTRIL) 40 MG tablet Take 40 mg by mouth daily.     . Loperamide HCl (IMODIUM PO) Take 1 capsule by mouth as needed.    Marland Kitchen morphine (MS CONTIN) 60 MG 12 hr tablet Take 1 tablet (60 mg total) by mouth every 8 (eight) hours. Take 1 tablet every 8 hours. 90 tablet 0  . nitroGLYCERIN (NITROSTAT) 0.4 MG SL tablet PLACE 1 TABLET UNDER TONGUE IF NEEDED FOR CHEST PAIN..UP TO 3 DOSES 25 tablet 3  . omeprazole (PRILOSEC) 40 MG capsule Take 40 mg by mouth daily as needed (for acid reflux).     Marland Kitchen OVER THE COUNTER MEDICATION Take 1 tablet by mouth as needed. OTC med for itching    . Oxycodone HCl 10 MG TABS Take 1-2 tablets (10-20 mg total) by mouth every 4 (four) hours as needed. Take 1 or 2 tablets every 4 hours as needed for pain.. 100 tablet 0   No current facility-administered medications for this visit.    Past Medical History  Diagnosis Date  . Hypertension   . DVT (deep venous thrombosis) ~ 2013  . Rectal cancer   . Coronary artery disease     a. 03/2013: abnl nuc ->  LHC s/p DES to LCx, residual moderate disease in LAD (med rx unless refractory angina). b. Not on BB due to bradycardia.  Marland Kitchen History of blood transfusion     "once; after throwing up alot of blood" (04/17/2013)  . GERD (gastroesophageal reflux disease)   . Chronic lower back pain     a. Followed by pain management at [facility].  . PAD (peripheral artery disease)     a. Occlusion of the right internal iliac artery, with significant atherosclerosis in the left internal iliac which was not amenable to reconstruction per notes from [facility].  . LV dysfunction     a. EF 45% in 03/2013.    . Tobacco abuse   . Colon cancer     rectal cancer  . Pulmonary nodules 09/30/2014    Past Surgical History  Procedure Laterality Date  . Colostomy    . Abdominal surgery  1990's    'for stomach ulcers" (04/17/2013)  . Coronary angioplasty with stent placement  04/17/2013    "?1" (04/17/2013)  . Inguinal hernia repair Bilateral 1990's  . Colostomy takedown  2013  . Colectomy  2012    "for rectal cancer" (04/17/2013)  . Left heart catheterization with coronary angiogram N/A 04/17/2013    Procedure: LEFT HEART CATHETERIZATION WITH CORONARY ANGIOGRAM;  Surgeon: Peter M Martinique, MD;  Location: [facility] CATH LAB;  Service: Cardiovascular;  Laterality: N/A;  . Percutaneous stent intervention  04/17/2013    Procedure: PERCUTANEOUS STENT INTERVENTION;  Surgeon: Peter M Martinique, MD;  Location: [facility] CATH LAB;  Service: Cardiovascular;;    Social History   Social History  . Marital Status: Married    Spouse Name: N/A  . Number of Children: N/A  . Years of Education: N/A   Occupational History  . Not on file.   Social History Main Topics  . Smoking status: Current Every Day Smoker -- 1.00 packs/day for 40 years    Types: Cigarettes    Start date: 03/14/1974  . Smokeless tobacco: Current User  . Alcohol Use: 4.2 oz/week    6 Cans of beer, 1 Shots of liquor per week     Comment: 04/17/2013 "bout a 6 pack/wk"  . Drug Use: Yes    Special: Marijuana     Comment: about a week ago  . Sexual Activity:    Partners: Male    Patent examiner Protection: None   Other Topics Concern  . Not on file   Social History Narrative     Filed Vitals:   05/13/15 1037  BP: 128/88  Pulse: 91  Height: '5\' 9"'$  (1.753 m)  Weight: 172 lb (78.019 kg)  SpO2: 94%    PHYSICAL EXAM General: NAD HEENT: Normal. Neck: No JVD, no thyromegaly. Lungs: Clear to auscultation bilaterally with normal respiratory effort. CV: Nondisplaced PMI. Regular rate and rhythm, normal S1/S2, no S3/+S4, no murmur. No pretibial or  periankle edema. No carotid bruit. Diminished pedal pulses b/l but warm extremities.  Abdomen: Soft, nontender,no distention.  Neurologic: Alert and oriented x 3.  Psych: Normal affect. Skin: Normal. Musculoskeletal: Normal range of motion, no gross deformities. Extremities: No clubbing or cyanosis.   ECG: Most recent ECG reviewed.      ASSESSMENT AND PLAN: 1. CAD: Stable ischemic heart disease. Continue ASA, Lipitor, and Imdur.   2. Essential HTN: Borderline elevated DBP on present therapy which includes lisinopril and amlodipine. Will monitor for now.   3. Hyperlipidemia: LDL 96, HDL 45 in 03/2013. On high dose Lipitor  80 mg daily. Will repeat lipids.  4. Tobacco abuse: Extensive counseling again provided with respect to cessation (5 minutes). Smokes 1 ppd. I am not certain he will quit.   5. Claudication with left leg PVD: ABI results noted above. Previously made vascular surgery referral. Continue ASA and statin. Tobacco cessation is paramount.  Spoke about being offered an arteriogram but he does not remember this and wants another appt with vascular surgery, which I will help arrange.  Dispo: f/u 6 months.   Kate Sable, M.D., F.A.C.C.

## 2015-05-15 DIAGNOSIS — C20 Malignant neoplasm of rectum: Secondary | ICD-10-CM | POA: Diagnosis not present

## 2015-05-15 DIAGNOSIS — F17200 Nicotine dependence, unspecified, uncomplicated: Secondary | ICD-10-CM | POA: Diagnosis not present

## 2015-05-15 DIAGNOSIS — I2510 Atherosclerotic heart disease of native coronary artery without angina pectoris: Secondary | ICD-10-CM | POA: Diagnosis not present

## 2015-05-15 DIAGNOSIS — I10 Essential (primary) hypertension: Secondary | ICD-10-CM | POA: Diagnosis not present

## 2015-06-04 ENCOUNTER — Encounter: Payer: Self-pay | Admitting: Vascular Surgery

## 2015-06-05 ENCOUNTER — Ambulatory Visit (INDEPENDENT_AMBULATORY_CARE_PROVIDER_SITE_OTHER): Payer: Medicare Other | Admitting: Vascular Surgery

## 2015-06-05 ENCOUNTER — Other Ambulatory Visit: Payer: Self-pay

## 2015-06-05 ENCOUNTER — Encounter: Payer: Self-pay | Admitting: Vascular Surgery

## 2015-06-05 VITALS — BP 132/89 | HR 74 | Ht 69.0 in | Wt 177.0 lb

## 2015-06-05 DIAGNOSIS — I739 Peripheral vascular disease, unspecified: Secondary | ICD-10-CM

## 2015-06-05 DIAGNOSIS — I2510 Atherosclerotic heart disease of native coronary artery without angina pectoris: Secondary | ICD-10-CM

## 2015-06-05 NOTE — Progress Notes (Signed)
HISTORY AND PHYSICAL     CC:  Left leg pain Referring Provider:  Jacinta Shoe, MD  HPI: This is a 54 y.o. male who presents today with complaints of left leg calf pain with walking that has worsened over the past year.  He states that a year ago, he could walk ~ 0.25 miles and now he can only walk a city block without having to stop and rest.  At that time, his pain improves and he continues to walk.  He does have burning in both feet at night if the covers are too tight on his feet.  He does not have any pain in the right leg.  He returns today for follow up after evaluation 5 months ago.  Symptoms remain unchanged.  He states that he was evaluated at [facility] for this and could not get stents in, however, he does not recall having an arteriogram.  He states that he continues to smoke a little less than a pack of cigarettes per day.  He expresses that he would like to quit and has thought about it.  He has smoked for about 40 years.  His wife continues to smoke a pack of cigarettes, which will last her about 3-4 days.    He does have a hx of drug eluting coronary artery stenting in 2014 by Dr. Martinique.  He does have a hx of rectal cancer that was diagnosed in 2012 and did have radiation, chemotherapy and surgery.  He continues to have chronic pain with this and is on disability.  He states that for the most part, he does have normal bowel movements, but does need occasional stool softeners.  He did have an exploratory laparotomy ~ 25-30 years ago for ulcer disease.  He is on a daily aspirin. He does take an ACEI for his HTN.  He is on a statin for his cholesterol.    Past Medical History   Diagnosis  Date   .  Hypertension     .  DVT (deep venous thrombosis)  ~ 2013   .  Rectal cancer     .  Coronary artery disease         a. 03/2013: abnl nuc -> LHC s/p DES to LCx, residual moderate disease in LAD (med rx unless refractory angina). b. Not on BB due to bradycardia.   Marland Kitchen  History of  blood transfusion         "once; after throwing up alot of blood" (04/17/2013)   .  GERD (gastroesophageal reflux disease)     .  Chronic lower back pain         a. Followed by pain management at [facility].   .  PAD (peripheral artery disease)         a. Occlusion of the right internal iliac artery, with significant atherosclerosis in the left internal iliac which was not amenable to reconstruction per notes from [facility].   .  LV dysfunction         a. EF 45% in 03/2013.   .  Tobacco abuse     .  Colon cancer         rectal cancer   .  Pulmonary nodules  09/30/2014       Past Surgical History   Procedure  Laterality  Date   .  Colostomy       .  Abdominal surgery    1990's       'for stomach ulcers" (  04/17/2013)   .  Coronary angioplasty with stent placement    04/17/2013       "?1" (04/17/2013)   .  Inguinal hernia repair  Bilateral  1990's   .  Colostomy takedown    2013   .  Colectomy    2012       "for rectal cancer" (04/17/2013)   .  Left heart catheterization with coronary angiogram  N/A  04/17/2013       Procedure: LEFT HEART CATHETERIZATION WITH CORONARY ANGIOGRAM;  Surgeon: Peter M Martinique, MD;  Location: [facility] CATH LAB;  Service: Cardiovascular;  Laterality: N/A;   .  Percutaneous stent intervention    04/17/2013       Procedure: PERCUTANEOUS STENT INTERVENTION;  Surgeon: Peter M Martinique, MD;  Location: [facility] CATH LAB;  Service: Cardiovascular;;       Allergies   Allergen  Reactions   .  Darvocet [Propoxyphene N-Acetaminophen]  Palpitations       Current Outpatient Prescriptions   Medication  Sig  Dispense  Refill   .  amLODipine (NORVASC) 5 MG tablet  TAKE 1 TABLET (5 MG TOTAL) BY MOUTH DAILY.  30 tablet  6   .  aspirin EC 81 MG tablet  Take 81 mg by mouth daily.       Mariane Baumgarten Calcium (STOOL SOFTENER PO)  Take 1 capsule by mouth daily as needed.        .  isosorbide mononitrate (IMDUR) 30 MG 24 hr tablet  TAKE 1/2 TABLET DAILY  15 tablet  6   .  lisinopril  (PRINIVIL,ZESTRIL) 40 MG tablet  Take 40 mg by mouth daily.        .  Loperamide HCl (IMODIUM PO)  Take 1 capsule by mouth as needed.       Marland Kitchen  morphine (MS CONTIN) 60 MG 12 hr tablet  Take 1 tablet (60 mg total) by mouth every 8 (eight) hours. Take 1 tablet every 8 hours.  90 tablet  0   .  NITROSTAT 0.4 MG SL tablet  PLACE 1 TABLET UNDER TONGUE IF NEEDED FOR CHEST PAIN..UP TO 3 DOSES  25 tablet  1   .  omeprazole (PRILOSEC) 40 MG capsule  Take 40 mg by mouth daily as needed (for acid reflux).        .  Oxycodone HCl 10 MG TABS  Take 1-2 tablets (10-20 mg total) by mouth every 4 (four) hours as needed. Take 1 or 2 tablets every 4 hours as needed for pain..  100 tablet  0   .  atorvastatin (LIPITOR) 80 MG tablet  Take 1 tablet (80 mg total) by mouth every evening. (Patient not taking: Reported on 01/10/2015)  30 tablet  6   .  gabapentin (NEURONTIN) 300 MG capsule  Take 900-1,200 mg by mouth 3 (three) times daily. Takes '900mg'$  in the morning, '1200mg'$  in the afternoon, and '1200mg'$  in the evening.       Marland Kitchen  OVER THE COUNTER MEDICATION  Take 1 tablet by mouth as needed. OTC med for itching          No current facility-administered medications for this visit.       Family History   Problem  Relation  Age of Onset   .  Cancer  Mother     .  Hypertension  Mother     .  Bleeding Disorder  Brother         History  Social History   .  Marital Status:  Married       Spouse Name:  N/A   .  Number of Children:  N/A   .  Years of Education:  N/A      Occupational History   .  Not on file.      Social History Main Topics   .  Smoking status:  Current Every Day Smoker -- 1.00 packs/day for 40 years       Types:  Cigarettes       Start date:  03/14/1974   .  Smokeless tobacco:  Current User   .  Alcohol Use:  4.2 oz/week       6 Cans of beer, 1 Shots of liquor per week         Comment: 04/17/2013 "bout a 6 pack/wk"   .  Drug Use:  Yes       Special:  Marijuana         Comment: about a week ago    .  Sexual Activity:  Yes       Birth Control/ Protection:  None      Other Topics  Concern   .  Not on file      Social History Narrative      ROS: '[x]'$  Positive   '[ ]'$  Negative   '[ ]'$  All sytems reviewed and are negative  Cardiovascular: '[]'$  chest pain/pressure '[]'$  palpitations '[]'$  SOB lying flat '[]'$  DOE '[x]'$  pain in left leg while walking '[x]'$  buring in feet when lying flat if covers are too tight   '[x]'$  hx of DVT '[]'$  hx of phlebitis '[]'$  swelling in legs '[]'$  varicose veins  Pulmonary: '[]'$  productive cough '[]'$  asthma '[]'$  wheezing  Neurologic: '[x]'$  weakness in '[]'$  arms '[x]'$  left leg '[]'$  numbness in '[]'$  arms '[]'$  legs '[]'$ difficulty speaking or slurred speech '[]'$  temporary loss of vision in one eye '[]'$  dizziness  Hematologic: '[]'$  bleeding problems '[]'$  problems with blood clotting easily '[x]'$  hx of rectal cancer  GI '[]'$  vomiting blood '[]'$  blood in stool '[x]'$  hx of ulcers  GU: '[]'$  burning with urination '[]'$  blood in urine  Psychiatric: '[]'$  hx of major depression  Integumentary: '[]'$  rashes '[]'$  ulcers  Constitutional: '[]'$  fever '[]'$  chills   PHYSICAL EXAMINATION:    Filed Vitals:   06/05/15 0833  BP: 132/89  Pulse: 74  Height: '5\' 9"'$  (1.753 m)  Weight: 177 lb (80.287 kg)  SpO2: 93%    General:  WDWN in NAD HENT: WNL, normocephalic Pulmonary: normal non-labored breathing with distant breath sounds bilaterally Cardiac: RRR, without  Murmurs, rubs or gallops; without carotid bruits Abdomen: soft, NT, no masses; well healed laparotomy scar Skin: without rashes, without ulcers  Vascular Exam/Pulses:   Right  Left   Femoral  2+ (normal)  2+ (normal)   Popliteal  2+ (normal)  absent   DP  2+ (normal)  absent   PT  2+ (normal)  absent    Extremities: without ischemic changes, without Gangrene , without cellulitis; without open wounds;  Musculoskeletal: no muscle wasting or atrophy       Neurologic: A&O X 3; Appropriate Affect ; SENSATION: normal; MOTOR FUNCTION:  moving all  extremities equally. Speech is fluent/normal   Non-Invasive Vascular Imaging:   ABI's 01/10/15: Right:  1.09 Left:  0.65   Pt meds includes: Statin:  Yes.   Beta Blocker:  No. Aspirin:  Yes.   ACEI: Yes.   ARB: No. Other Antiplatelet/Anticoagulant:  No.    ASSESSMENT/PLAN::  Patient has mild claudication symptoms left leg but feels limited in his walking distance.  At this time he wishes to be scheduled for an arteriogram for further evaluation and possible bypass of the left leg.  We have scheduled this for Friday, 06/07/2015. Risks benefits possible, patient procedure details were discussed with the patient. Previous arteriogram showed that he was not a candidate for percutaneous intervention. We will also schedule him in the near future for a cardiac risk stratification a point with Dr Jacinta Shoe as he will need femoral popliteal bypass to improve his symptoms.  Ruta Hinds, MD [facility] Office: [phone] Pager: [phone]

## 2015-06-12 ENCOUNTER — Encounter: Payer: Self-pay | Admitting: *Deleted

## 2015-06-12 ENCOUNTER — Encounter: Payer: Self-pay | Admitting: Physician Assistant

## 2015-06-12 ENCOUNTER — Ambulatory Visit (INDEPENDENT_AMBULATORY_CARE_PROVIDER_SITE_OTHER): Payer: Medicare Other | Admitting: Physician Assistant

## 2015-06-12 VITALS — BP 118/78 | HR 79 | Ht 73.0 in | Wt 176.0 lb

## 2015-06-12 DIAGNOSIS — R072 Precordial pain: Secondary | ICD-10-CM

## 2015-06-12 DIAGNOSIS — I2510 Atherosclerotic heart disease of native coronary artery without angina pectoris: Secondary | ICD-10-CM

## 2015-06-12 DIAGNOSIS — Z01810 Encounter for preprocedural cardiovascular examination: Secondary | ICD-10-CM | POA: Insufficient documentation

## 2015-06-12 DIAGNOSIS — Z720 Tobacco use: Secondary | ICD-10-CM

## 2015-06-12 DIAGNOSIS — I70219 Atherosclerosis of native arteries of extremities with intermittent claudication, unspecified extremity: Secondary | ICD-10-CM | POA: Diagnosis not present

## 2015-06-12 DIAGNOSIS — I255 Ischemic cardiomyopathy: Secondary | ICD-10-CM

## 2015-06-12 NOTE — Assessment & Plan Note (Signed)
Patient has history of drug-eluting stent to the proximal circumflex 03/2013. There is moderate 70-80% obstructive disease in the distal LAD. He also has 30% proximal LAD and 30-40% proximal RCA. He has mild LV dysfunction EF 45%. He had chest tightness 4 days ago while smoking relieved with sublingual nitroglycerin. He does not have this often. With his history of CAD and ischemic cardiomyopathy and recent chest pain recommend Lexi scan Myoview prior to undergoing surgery. Smoking cessation essential. Follow-up with Dr.Koneswaran.

## 2015-06-12 NOTE — Progress Notes (Signed)
Cardiology Office Note   Date:  06/12/2015   ID:  Juan Wheeler, DOB 1961-08-13, MRN [id number]  PCP:  [name], MD  Cardiologist: Dr. Bronson Ing  Chief Complaint: Preoperative clearance    History of Present Illness: Juan Wheeler is a 54 y.o. male who presents for preoperative cardiac clearance before undergoing arteriogram and possible need for femoral-popliteal bypass of his left leg for claudication symptoms.  He has a history of CAD with an abnormal Myoview in 04/05/2013 this was followed up with a cardiac catheterization 04/17/13 at which time he underwent PCI with DES of the culprit lesion which was the left circumflex. He has residual moderate obstructive disease in the distal LAD with 70-80% stenosis, 30% diffuse proximal LAD, 30-40% proximal RCA, EF 45% with basal to mid inferior wall hypokinesis.  Patient also has history of hypertension, hyperlipidemia and tobacco abuse. He also has history of rectal cancer treated with surgery radiation and chemotherapy in 2012.  Patient last saw Dr.Koneswaran 05/13/15 and was doing well without angina. Patient comes in today stating that he is trying to quit smoking. He switched to menthol cigarettes. 4 nights ago while smoking he developed chest tightness relieved with nitroglycerin. This is a first she's had chest tightness in a while and he blames it on the menthol cigarettes. He says he has taken a total of 3 nitroglycerin in the past 2 years. He can't walk 100 yards before he develops leg pain. He does do his own housework and working in the yard for short periods of time without chest pain. He is very limited because of his claudication symptoms. He has never had symptoms of heart failure. He denies any dizziness or presyncope, dyspnea or orthopnea.    Past Medical History  Diagnosis Date  . Hypertension   . DVT (deep venous thrombosis) ~ 2013  . Rectal cancer   . Coronary artery disease     a. 03/2013: abnl nuc -> LHC s/p DES to LCx,  residual moderate disease in LAD (med rx unless refractory angina). b. Not on BB due to bradycardia.  Marland Kitchen History of blood transfusion     "once; after throwing up alot of blood" (04/17/2013)  . GERD (gastroesophageal reflux disease)   . Chronic lower back pain     a. Followed by pain management at [facility].  . PAD (peripheral artery disease)     a. Occlusion of the right internal iliac artery, with significant atherosclerosis in the left internal iliac which was not amenable to reconstruction per notes from [facility].  . LV dysfunction     a. EF 45% in 03/2013.  . Tobacco abuse   . Colon cancer     rectal cancer  . Pulmonary nodules 09/30/2014    Past Surgical History  Procedure Laterality Date  . Colostomy    . Abdominal surgery  1990's    'for stomach ulcers" (04/17/2013)  . Coronary angioplasty with stent placement  04/17/2013    "?1" (04/17/2013)  . Inguinal hernia repair Bilateral 1990's  . Colostomy takedown  2013  . Colectomy  2012    "for rectal cancer" (04/17/2013)  . Left heart catheterization with coronary angiogram N/A 04/17/2013    Procedure: LEFT HEART CATHETERIZATION WITH CORONARY ANGIOGRAM;  Surgeon: Peter M Martinique, MD;  Location: [facility] CATH LAB;  Service: Cardiovascular;  Laterality: N/A;  . Percutaneous stent intervention  04/17/2013    Procedure: PERCUTANEOUS STENT INTERVENTION;  Surgeon: Peter M Martinique, MD;  Location: [facility] CATH LAB;  Service: Cardiovascular;;  Current Outpatient Prescriptions  Medication Sig Dispense Refill  . amLODipine (NORVASC) 5 MG tablet TAKE 1 TABLET (5 MG TOTAL) BY MOUTH DAILY. 30 tablet 6  . aspirin EC 81 MG tablet Take 81 mg by mouth daily.    Marland Kitchen atorvastatin (LIPITOR) 80 MG tablet Take 1 tablet (80 mg total) by mouth every evening. 30 tablet 6  . isosorbide mononitrate (IMDUR) 30 MG 24 hr tablet Take 0.5 tablets (15 mg total) by mouth daily. 15 tablet 6  . lisinopril (PRINIVIL,ZESTRIL) 40 MG tablet Take 40 mg by mouth daily.     Marland Kitchen  morphine (MS CONTIN) 60 MG 12 hr tablet Take 1 tablet (60 mg total) by mouth every 8 (eight) hours. Take 1 tablet every 8 hours. 90 tablet 0  . nitroGLYCERIN (NITROSTAT) 0.4 MG SL tablet PLACE 1 TABLET UNDER TONGUE IF NEEDED FOR CHEST PAIN..UP TO 3 DOSES 25 tablet 3  . Oxycodone HCl 10 MG TABS Take 1-2 tablets (10-20 mg total) by mouth every 4 (four) hours as needed. Take 1 or 2 tablets every 4 hours as needed for pain.. (Patient taking differently: Take 10-20 mg by mouth every 4 (four) hours as needed (for pain). ) 100 tablet 0   No current facility-administered medications for this visit.    Allergies:   Darvocet    Social History:  The patient  reports that he has been smoking Cigarettes.  He started smoking about 41 years ago. He has a 10 pack-year smoking history. He uses smokeless tobacco. He reports that he drinks about 4.2 oz of alcohol per week. He reports that he uses illicit drugs (Marijuana).   Family History:  The patient's    family history includes Bleeding Disorder in his brother; Cancer in his mother; Hypertension in his mother.    ROS:  Please see the history of present illness.   Otherwise, review of systems are positive for none.   All other systems are reviewed and negative.    PHYSICAL EXAM: VS:  BP 118/78 mmHg  Pulse 79  Ht '6\' 1"'$  (1.854 m)  Wt 176 lb (79.833 kg)  BMI 23.23 kg/m2  SpO2 96% , BMI Body mass index is 23.23 kg/(m^2). GEN: Well nourished, well developed, in no acute distress smells of cigarette smoke Neck: no JVD, HJR, carotid bruits, or masses Cardiac: RRR; positive S4, 2/6 systolic murmur at the left sternal border, no gallop, rubs, thrill or heave,  Respiratory:  Decreased breath sounds but clear to auscultation bilaterally, normal work of breathing GI: soft, nontender, nondistended, + BS MS: no deformity or atrophy Extremities: without cyanosis, clubbing, edema, good distal pulses bilaterally.  Skin: warm and dry, no rash Neuro:  Strength and  sensation are intact    EKG:  EKG is not ordered today. EKG reviewed from 05/13/15 showing normal sinus rhythm with nonspecific ST-T wave changes    Recent Labs: 03/27/2015: ALT 12*; BUN 11; Creatinine, Ser 1.23; Hemoglobin 17.6*; Platelets 193; Potassium 4.3; Sodium 138    Lipid Panel    Component Value Date/Time   CHOL 161 03/29/2013 0517   TRIG 100 03/29/2013 0517   HDL 45 03/29/2013 0517   CHOLHDL 3.6 03/29/2013 0517   VLDL 20 03/29/2013 0517   LDLCALC 96 03/29/2013 0517      Wt Readings from Last 3 Encounters:  06/12/15 176 lb (79.833 kg)  06/05/15 177 lb (80.287 kg)  05/13/15 172 lb (78.019 kg)      Other studies Reviewed: Additional studies/ records that were reviewed  today include and review of the records demonstrates:   Arterial ultrasound of the lower extremities 11/02/14 IMPRESSION: Right:   Resting ankle brachial index demonstrates no evidence of arterial occlusive disease at rest. Potentially this could worsen with exercise.   Left:   Resting ankle-brachial index demonstrates moderate arterial occlusive disease. This could worsen with exercise.   Doppler signal at the posterior tibial and dorsalis pedis arteries are abnormal, compatible with more proximal atherosclerotic disease resulting in decreased flow.   Signed,   Dulcy Fanny. Earleen Newport, DO   [facility]   [facility]     PROCEDURAL FINDINGS Hemodynamics: AO 132/80 mean 100 mm Hg LV 131/6 mm Hg              Coronary angiography: Coronary dominance: right  Left mainstem: Normal    Left anterior descending (LAD): The LAD is a large vessel that extends around the apex.There is 30% diffuse disease in the proximal vessel. In the distal LAD there is a focal 70-80% lesion.  Left circumflex (LCx): There is a focal 95% stenosis in the proximal LCX. This plaque appears ulcerated.  Right coronary artery (RCA): There is 30-40% disease in the proximal  vessel.  Left ventriculography: Left ventricular systolic function is normal, LVEF is estimated at 45%, there is basal to mid inferior wall hypokinesis, there is no significant mitral regurgitation   PCI Note:  Following the diagnostic procedure, the decision was made to proceed with PCI of the culprit lesion in the LCX.Marland Kitchen  Weight-based bivalirudin was given for anticoagulation. Effient 60 mg was given orally. Once a therapeutic ACT was achieved, a 6 [place] XBLAD 3.5 guide catheter was inserted.  A prowater coronary guidewire was used to cross the lesion.  The lesion was predilated with a 2.5 mm  balloon.  The lesion was then stented with a 3.0 x 12 mm Promus stent.  The stent was postdilated with a 3.0 mm  noncompliant balloon.  Following PCI, there was 0% residual stenosis and TIMI-3 flow. Final angiography confirmed an excellent result. The patient tolerated the procedure well. There were no immediate procedural complications. A TR band was used for radial hemostasis. The patient was transferred to the post catheterization recovery area for further monitoring.  PCI Data: Vessel - Left circumflex /Segment - proximal Percent Stenosis (pre)  95% TIMI-flow 3 Stent 3.0 x 12 Promus   Percent Stenosis (post) 0% TIMI-flow (post) 3  Final Conclusions:   1. 2 vessel obstructive CAD with culprit lesion in the proximal LCx. There is moderate obstructive disease in the distal LAD. 2. Mild LV dysfunction. 3. Successful stenting of the proximal LCx with a DES.   Recommendations:  Dual antiplatelet therapy for one year. Risk factor modification. If patient has refractory angina despite 2 antianginal drugs could consider PCI of the distal LAD.  Collier Salina [facility] 04/17/2013, 11:21 AM  Stress Myoview 04/05/13 IMPRESSION: Abnormal exercise/Lexiscan Myoview.  Low to intermediate risk study overall.  There were no clearly diagnostic ST-segment abnormalities.  Perfusion imaging shows a fixed defect in  the anterior wall that is relatively small, cannot rule out scar but no active ischemia in this distribution.  There is a small area of ischemia in the mid to basal lateral wall. LV volumes are normal, LVEF calculated at 42% with global hypokinesis.       ASSESSMENT AND PLAN:   Preoperative cardiovascular examination Patient has history of drug-eluting stent to the proximal circumflex 03/2013. There is moderate 70-80% obstructive disease  in the distal LAD. He also has 30% proximal LAD and 30-40% proximal RCA. He has mild LV dysfunction EF 45%. He had chest tightness 4 days ago while smoking relieved with sublingual nitroglycerin. He does not have this often. With his history of CAD and ischemic cardiomyopathy and recent chest pain recommend Lexi scan Myoview prior to undergoing surgery. Smoking cessation essential. Follow-up with Dr.Koneswaran.  Atherosclerosis of native arteries of extremity with intermittent claudication Patient is scheduled for arteriogram Friday.  ASCVD (arteriosclerotic cardiovascular disease) See above dictation. Recent angina. Check Lexi scan Myoview.  Cardiomyopathy, ischemic Patient's ejection fraction was 45%. He has no symptoms of heart failure.  Tobacco abuse Importance of smoking cessation discussed.       Sumner Boast, [name]  06/12/2015 11:34 AM    [facility] [place], [place], [place]  [address] Phone: [phone]; Fax: [phone]

## 2015-06-12 NOTE — Assessment & Plan Note (Signed)
See above dictation. Recent angina. Check Lexi scan Myoview.

## 2015-06-12 NOTE — Patient Instructions (Signed)
Your physician recommends that you schedule a follow-up appointment in: 2 months with Dr. Bronson Ing.  Your physician has requested that you have a lexiscan myoview. For further information please visit [email or web address]. Please follow instruction sheet, as given.  Your physician recommends that you continue on your current medications as directed. Please refer to the Current Medication list given to you today.  Thank you for choosing [facility]!

## 2015-06-12 NOTE — Assessment & Plan Note (Signed)
Patient's ejection fraction was 45%. He has no symptoms of heart failure.

## 2015-06-12 NOTE — Assessment & Plan Note (Signed)
Patient is scheduled for arteriogram Friday.

## 2015-06-12 NOTE — Assessment & Plan Note (Signed)
Importance of smoking cessation discussed.

## 2015-06-14 ENCOUNTER — Encounter (HOSPITAL_COMMUNITY)
Admission: RE | Disposition: A | Discharge status: Home / Self Care | Payer: Self-pay | Source: Ambulatory Visit | Attending: Vascular Surgery

## 2015-06-14 ENCOUNTER — Ambulatory Visit (HOSPITAL_COMMUNITY)
Admission: RE | Admit: 2015-06-14 | Discharge: 2015-06-14 | Disposition: A | Discharge status: Home / Self Care | Payer: Medicare Other | Source: Ambulatory Visit | Attending: Vascular Surgery | Admitting: Vascular Surgery

## 2015-06-14 DIAGNOSIS — G8929 Other chronic pain: Secondary | ICD-10-CM | POA: Diagnosis not present

## 2015-06-14 DIAGNOSIS — K219 Gastro-esophageal reflux disease without esophagitis: Secondary | ICD-10-CM | POA: Insufficient documentation

## 2015-06-14 DIAGNOSIS — Z85048 Personal history of other malignant neoplasm of rectum, rectosigmoid junction, and anus: Secondary | ICD-10-CM | POA: Diagnosis not present

## 2015-06-14 DIAGNOSIS — Z7982 Long term (current) use of aspirin: Secondary | ICD-10-CM | POA: Diagnosis not present

## 2015-06-14 DIAGNOSIS — I10 Essential (primary) hypertension: Secondary | ICD-10-CM | POA: Insufficient documentation

## 2015-06-14 DIAGNOSIS — M545 Low back pain: Secondary | ICD-10-CM | POA: Insufficient documentation

## 2015-06-14 DIAGNOSIS — F17210 Nicotine dependence, cigarettes, uncomplicated: Secondary | ICD-10-CM | POA: Insufficient documentation

## 2015-06-14 DIAGNOSIS — I70213 Atherosclerosis of native arteries of extremities with intermittent claudication, bilateral legs: Secondary | ICD-10-CM | POA: Diagnosis not present

## 2015-06-14 DIAGNOSIS — Z955 Presence of coronary angioplasty implant and graft: Secondary | ICD-10-CM | POA: Diagnosis not present

## 2015-06-14 DIAGNOSIS — Z85038 Personal history of other malignant neoplasm of large intestine: Secondary | ICD-10-CM | POA: Diagnosis not present

## 2015-06-14 DIAGNOSIS — I2510 Atherosclerotic heart disease of native coronary artery without angina pectoris: Secondary | ICD-10-CM | POA: Diagnosis not present

## 2015-06-14 DIAGNOSIS — Z86718 Personal history of other venous thrombosis and embolism: Secondary | ICD-10-CM | POA: Diagnosis not present

## 2015-06-14 DIAGNOSIS — I739 Peripheral vascular disease, unspecified: Secondary | ICD-10-CM | POA: Diagnosis present

## 2015-06-14 HISTORY — PX: PERIPHERAL VASCULAR CATHETERIZATION: SHX172C

## 2015-06-14 LAB — POCT I-STAT, CHEM 8
BUN: 18 mg/dL (ref 6–20)
Calcium, Ion: 1.2 mmol/L (ref 1.12–1.23)
Chloride: 103 mmol/L (ref 101–111)
Creatinine, Ser: 1.2 mg/dL (ref 0.61–1.24)
Glucose, Bld: 111 mg/dL — ABNORMAL HIGH (ref 65–99)
HCT: 53 % — ABNORMAL HIGH (ref 39.0–52.0)
Hemoglobin: 18 g/dL — ABNORMAL HIGH (ref 13.0–17.0)
Potassium: 4.2 mmol/L (ref 3.5–5.1)
Sodium: 141 mmol/L (ref 135–145)
TCO2: 25 mmol/L (ref 0–100)

## 2015-06-14 SURGERY — ABDOMINAL AORTOGRAM

## 2015-06-14 MED ORDER — LABETALOL HCL 5 MG/ML IV SOLN
10.0000 mg | INTRAVENOUS | Status: DC | PRN
Start: 2015-06-14 — End: 2015-06-14

## 2015-06-14 MED ORDER — SODIUM CHLORIDE 0.9 % IV SOLN
INTRAVENOUS | Status: DC
  Administered 2015-06-14: 1000 mL via INTRAVENOUS

## 2015-06-14 MED ORDER — HYDRALAZINE HCL 20 MG/ML IJ SOLN: 5.0000 mg | INTRAMUSCULAR | Status: DC | PRN

## 2015-06-14 MED ORDER — LIDOCAINE HCL (PF) 1 % IJ SOLN
INTRAMUSCULAR | Status: DC | PRN
  Administered 2015-06-14: 9 mL

## 2015-06-14 MED ORDER — SODIUM CHLORIDE 0.45 % IV SOLN: INTRAVENOUS | Status: DC

## 2015-06-14 MED ORDER — LIDOCAINE HCL (PF) 1 % IJ SOLN
INTRAMUSCULAR | Status: AC
  Filled 2015-06-14: qty 30

## 2015-06-14 MED ORDER — MORPHINE SULFATE (PF) 10 MG/ML IV SOLN: 2.0000 mg | INTRAVENOUS | Status: DC | PRN

## 2015-06-14 MED ORDER — METOPROLOL TARTRATE 1 MG/ML IV SOLN: 2.0000 mg | INTRAVENOUS | Status: DC | PRN

## 2015-06-14 MED ORDER — ACETAMINOPHEN 325 MG PO TABS
325.0000 mg | ORAL_TABLET | ORAL | Status: DC | PRN
  Filled 2015-06-14: qty 2

## 2015-06-14 MED ORDER — HEPARIN (PORCINE) IN NACL 2-0.9 UNIT/ML-% IJ SOLN
INTRAMUSCULAR | Status: AC
  Filled 2015-06-14: qty 1000

## 2015-06-14 MED ORDER — IODIXANOL 320 MG/ML IV SOLN
INTRAVENOUS | Status: DC | PRN
  Administered 2015-06-14: 135 mL via INTRAVENOUS

## 2015-06-14 MED ORDER — ACETAMINOPHEN 325 MG RE SUPP
325.0000 mg | RECTAL | Status: DC | PRN
  Filled 2015-06-14: qty 2

## 2015-06-14 MED ORDER — ONDANSETRON HCL 4 MG/2ML IJ SOLN: 4.0000 mg | Freq: Four times a day (QID) | INTRAMUSCULAR | Status: DC | PRN

## 2015-06-14 SURGICAL SUPPLY — 7 items
CATH ANGIO 5F PIGTAIL 65CM (CATHETERS) ×3 IMPLANT
KIT PV (KITS) ×3 IMPLANT
SHEATH PINNACLE 5F 10CM (SHEATH) ×3 IMPLANT
SYR MEDRAD MARK V 150ML (SYRINGE) ×3 IMPLANT
TRANSDUCER W/STOPCOCK (MISCELLANEOUS) ×3 IMPLANT
TRAY PV CATH (CUSTOM PROCEDURE TRAY) ×3 IMPLANT
WIRE HITORQ VERSACORE ST 145CM (WIRE) ×3 IMPLANT

## 2015-06-14 NOTE — Interval H&P Note (Signed)
History and Physical Interval Note:  06/14/2015 8:17 AM  Juan Wheeler  has presented today for surgery, with the diagnosis of pvd w./ left lower extremity claudication  The various methods of treatment have been discussed with the patient and family. After consideration of risks, benefits and other options for treatment, the patient has consented to  Procedure(s): Abdominal Aortogram (N/A) as a surgical intervention .  The patient's history has been reviewed, patient examined, no change in status, stable for surgery.  I have reviewed the patient's chart and labs.  Questions were answered to the patient's satisfaction.     Ruta Hinds

## 2015-06-14 NOTE — Progress Notes (Signed)
Order for sheath removal verified per post procedural orders. Procedure explained to patient and Rt femoral artery access site assessed: level 0, palpable dorsalis pedis. 5French Sheath removed and manual pressure applied for 20 minutes. Pre, peri, & post procedural vitals: HR 58, RR 15, O2 Sat upper 95, BP 120/79, Pain 0. Distal pulses remained intact after sheath removal. Access site level 0 and dressed with 4X4 gauze and tegaderm.  Post procedural instructions discussed and return demonstration from patient.

## 2015-06-14 NOTE — H&P (View-Only) (Signed)
HISTORY AND PHYSICAL     CC:  Left leg pain Referring Provider:  Jacinta Shoe, MD  HPI: This is a 54 y.o. male who presents today with complaints of left leg calf pain with walking that has worsened over the past year.  He states that a year ago, he could walk ~ 0.25 miles and now he can only walk a city block without having to stop and rest.  At that time, his pain improves and he continues to walk.  He does have burning in both feet at night if the covers are too tight on his feet.  He does not have any pain in the right leg.  He returns today for follow up after evaluation 5 months ago.  Symptoms remain unchanged.  He states that he was evaluated at [facility] for this and could not get stents in, however, he does not recall having an arteriogram.  He states that he continues to smoke a little less than a pack of cigarettes per day.  He expresses that he would like to quit and has thought about it.  He has smoked for about 40 years.  His wife continues to smoke a pack of cigarettes, which will last her about 3-4 days.    He does have a hx of drug eluting coronary artery stenting in 2014 by Dr. Martinique.  He does have a hx of rectal cancer that was diagnosed in 2012 and did have radiation, chemotherapy and surgery.  He continues to have chronic pain with this and is on disability.  He states that for the most part, he does have normal bowel movements, but does need occasional stool softeners.  He did have an exploratory laparotomy ~ 25-30 years ago for ulcer disease.  He is on a daily aspirin. He does take an ACEI for his HTN.  He is on a statin for his cholesterol.    Past Medical History   Diagnosis  Date   .  Hypertension     .  DVT (deep venous thrombosis)  ~ 2013   .  Rectal cancer     .  Coronary artery disease         a. 03/2013: abnl nuc -> LHC s/p DES to LCx, residual moderate disease in LAD (med rx unless refractory angina). b. Not on BB due to bradycardia.   Marland Kitchen  History of  blood transfusion         "once; after throwing up alot of blood" (04/17/2013)   .  GERD (gastroesophageal reflux disease)     .  Chronic lower back pain         a. Followed by pain management at [facility].   .  PAD (peripheral artery disease)         a. Occlusion of the right internal iliac artery, with significant atherosclerosis in the left internal iliac which was not amenable to reconstruction per notes from [facility].   .  LV dysfunction         a. EF 45% in 03/2013.   .  Tobacco abuse     .  Colon cancer         rectal cancer   .  Pulmonary nodules  09/30/2014       Past Surgical History   Procedure  Laterality  Date   .  Colostomy       .  Abdominal surgery    1990's       'for stomach ulcers" (  04/17/2013)   .  Coronary angioplasty with stent placement    04/17/2013       "?1" (04/17/2013)   .  Inguinal hernia repair  Bilateral  1990's   .  Colostomy takedown    2013   .  Colectomy    2012       "for rectal cancer" (04/17/2013)   .  Left heart catheterization with coronary angiogram  N/A  04/17/2013       Procedure: LEFT HEART CATHETERIZATION WITH CORONARY ANGIOGRAM;  Surgeon: Peter M Martinique, MD;  Location: [facility] CATH LAB;  Service: Cardiovascular;  Laterality: N/A;   .  Percutaneous stent intervention    04/17/2013       Procedure: PERCUTANEOUS STENT INTERVENTION;  Surgeon: Peter M Martinique, MD;  Location: [facility] CATH LAB;  Service: Cardiovascular;;       Allergies   Allergen  Reactions   .  Darvocet [Propoxyphene N-Acetaminophen]  Palpitations       Current Outpatient Prescriptions   Medication  Sig  Dispense  Refill   .  amLODipine (NORVASC) 5 MG tablet  TAKE 1 TABLET (5 MG TOTAL) BY MOUTH DAILY.  30 tablet  6   .  aspirin EC 81 MG tablet  Take 81 mg by mouth daily.       Mariane Baumgarten Calcium (STOOL SOFTENER PO)  Take 1 capsule by mouth daily as needed.        .  isosorbide mononitrate (IMDUR) 30 MG 24 hr tablet  TAKE 1/2 TABLET DAILY  15 tablet  6   .  lisinopril  (PRINIVIL,ZESTRIL) 40 MG tablet  Take 40 mg by mouth daily.        .  Loperamide HCl (IMODIUM PO)  Take 1 capsule by mouth as needed.       Marland Kitchen  morphine (MS CONTIN) 60 MG 12 hr tablet  Take 1 tablet (60 mg total) by mouth every 8 (eight) hours. Take 1 tablet every 8 hours.  90 tablet  0   .  NITROSTAT 0.4 MG SL tablet  PLACE 1 TABLET UNDER TONGUE IF NEEDED FOR CHEST PAIN..UP TO 3 DOSES  25 tablet  1   .  omeprazole (PRILOSEC) 40 MG capsule  Take 40 mg by mouth daily as needed (for acid reflux).        .  Oxycodone HCl 10 MG TABS  Take 1-2 tablets (10-20 mg total) by mouth every 4 (four) hours as needed. Take 1 or 2 tablets every 4 hours as needed for pain..  100 tablet  0   .  atorvastatin (LIPITOR) 80 MG tablet  Take 1 tablet (80 mg total) by mouth every evening. (Patient not taking: Reported on 01/10/2015)  30 tablet  6   .  gabapentin (NEURONTIN) 300 MG capsule  Take 900-1,200 mg by mouth 3 (three) times daily. Takes '900mg'$  in the morning, '1200mg'$  in the afternoon, and '1200mg'$  in the evening.       Marland Kitchen  OVER THE COUNTER MEDICATION  Take 1 tablet by mouth as needed. OTC med for itching          No current facility-administered medications for this visit.       Family History   Problem  Relation  Age of Onset   .  Cancer  Mother     .  Hypertension  Mother     .  Bleeding Disorder  Brother         History  Social History   .  Marital Status:  Married       Spouse Name:  N/A   .  Number of Children:  N/A   .  Years of Education:  N/A      Occupational History   .  Not on file.      Social History Main Topics   .  Smoking status:  Current Every Day Smoker -- 1.00 packs/day for 40 years       Types:  Cigarettes       Start date:  03/14/1974   .  Smokeless tobacco:  Current User   .  Alcohol Use:  4.2 oz/week       6 Cans of beer, 1 Shots of liquor per week         Comment: 04/17/2013 "bout a 6 pack/wk"   .  Drug Use:  Yes       Special:  Marijuana         Comment: about a week ago    .  Sexual Activity:  Yes       Birth Control/ Protection:  None      Other Topics  Concern   .  Not on file      Social History Narrative      ROS: '[x]'$  Positive   '[ ]'$  Negative   '[ ]'$  All sytems reviewed and are negative  Cardiovascular: '[]'$  chest pain/pressure '[]'$  palpitations '[]'$  SOB lying flat '[]'$  DOE '[x]'$  pain in left leg while walking '[x]'$  buring in feet when lying flat if covers are too tight   '[x]'$  hx of DVT '[]'$  hx of phlebitis '[]'$  swelling in legs '[]'$  varicose veins  Pulmonary: '[]'$  productive cough '[]'$  asthma '[]'$  wheezing  Neurologic: '[x]'$  weakness in '[]'$  arms '[x]'$  left leg '[]'$  numbness in '[]'$  arms '[]'$  legs '[]'$ difficulty speaking or slurred speech '[]'$  temporary loss of vision in one eye '[]'$  dizziness  Hematologic: '[]'$  bleeding problems '[]'$  problems with blood clotting easily '[x]'$  hx of rectal cancer  GI '[]'$  vomiting blood '[]'$  blood in stool '[x]'$  hx of ulcers  GU: '[]'$  burning with urination '[]'$  blood in urine  Psychiatric: '[]'$  hx of major depression  Integumentary: '[]'$  rashes '[]'$  ulcers  Constitutional: '[]'$  fever '[]'$  chills   PHYSICAL EXAMINATION:    Filed Vitals:   06/05/15 0833  BP: 132/89  Pulse: 74  Height: '5\' 9"'$  (1.753 m)  Weight: 177 lb (80.287 kg)  SpO2: 93%    General:  WDWN in NAD HENT: WNL, normocephalic Pulmonary: normal non-labored breathing with distant breath sounds bilaterally Cardiac: RRR, without  Murmurs, rubs or gallops; without carotid bruits Abdomen: soft, NT, no masses; well healed laparotomy scar Skin: without rashes, without ulcers  Vascular Exam/Pulses:   Right  Left   Femoral  2+ (normal)  2+ (normal)   Popliteal  2+ (normal)  absent   DP  2+ (normal)  absent   PT  2+ (normal)  absent    Extremities: without ischemic changes, without Gangrene , without cellulitis; without open wounds;  Musculoskeletal: no muscle wasting or atrophy       Neurologic: A&O X 3; Appropriate Affect ; SENSATION: normal; MOTOR FUNCTION:  moving all  extremities equally. Speech is fluent/normal   Non-Invasive Vascular Imaging:   ABI's 01/10/15: Right:  1.09 Left:  0.65   Pt meds includes: Statin:  Yes.   Beta Blocker:  No. Aspirin:  Yes.   ACEI: Yes.   ARB: No. Other Antiplatelet/Anticoagulant:  No.    ASSESSMENT/PLAN::  Patient has mild claudication symptoms left leg but feels limited in his walking distance.  At this time he wishes to be scheduled for an arteriogram for further evaluation and possible bypass of the left leg.  We have scheduled this for Friday, 06/07/2015. Risks benefits possible, patient procedure details were discussed with the patient. Previous arteriogram showed that he was not a candidate for percutaneous intervention. We will also schedule him in the near future for a cardiac risk stratification a point with Dr Jacinta Shoe as he will need femoral popliteal bypass to improve his symptoms.  Ruta Hinds, MD [facility] Office: [phone] Pager: [phone]

## 2015-06-14 NOTE — Discharge Instructions (Signed)

## 2015-06-17 ENCOUNTER — Encounter (HOSPITAL_COMMUNITY): Payer: Self-pay | Admitting: Vascular Surgery

## 2015-06-18 ENCOUNTER — Encounter (HOSPITAL_COMMUNITY)
Admit: 2015-06-18 | Discharge: 2015-06-18 | Disposition: A | Discharge status: Home / Self Care | Payer: Medicare Other | Attending: Physician Assistant | Admitting: Physician Assistant

## 2015-06-18 ENCOUNTER — Inpatient Hospital Stay (HOSPITAL_COMMUNITY): Admission: RE | Admit: 2015-06-18 | Payer: Medicare Other | Source: Ambulatory Visit

## 2015-06-18 ENCOUNTER — Encounter (HOSPITAL_COMMUNITY): Payer: Self-pay

## 2015-06-18 ENCOUNTER — Encounter (HOSPITAL_COMMUNITY)
Admission: RE | Admit: 2015-06-18 | Discharge: 2015-06-18 | Disposition: A | Discharge status: Home / Self Care | Payer: Medicare Other | Source: Ambulatory Visit | Attending: Physician Assistant | Admitting: Physician Assistant

## 2015-06-18 DIAGNOSIS — R072 Precordial pain: Secondary | ICD-10-CM | POA: Insufficient documentation

## 2015-06-18 LAB — NM MYOCAR MULTI W/SPECT W/WALL MOTION / EF
LV dias vol: 138 mL
LV sys vol: 63 mL
Peak HR: 90 {beats}/min
RATE: 0.36
Rest HR: 61 {beats}/min
SDS: 4
SRS: 2
SSS: 6
TID: 1.19

## 2015-06-18 MED ORDER — REGADENOSON 0.4 MG/5ML IV SOLN
INTRAVENOUS | Status: AC
  Administered 2015-06-18: 0.4 mg via INTRAVENOUS
  Filled 2015-06-18: qty 5

## 2015-06-18 MED ORDER — SODIUM CHLORIDE 0.9 % IJ SOLN
INTRAMUSCULAR | Status: AC
  Administered 2015-06-18: 10 mL via INTRAVENOUS
  Filled 2015-06-18: qty 3

## 2015-06-18 MED ORDER — TECHNETIUM TC 99M SESTAMIBI GENERIC - CARDIOLITE
30.0000 | Freq: Once | INTRAVENOUS | Status: AC | PRN
  Administered 2015-06-18: 30 via INTRAVENOUS

## 2015-06-18 MED ORDER — TECHNETIUM TC 99M SESTAMIBI - CARDIOLITE
10.0000 | Freq: Once | INTRAVENOUS | Status: AC | PRN
  Administered 2015-06-18: 08:00:00 9 via INTRAVENOUS

## 2015-06-20 ENCOUNTER — Telehealth: Payer: Self-pay | Admitting: Physician Assistant

## 2015-06-20 ENCOUNTER — Other Ambulatory Visit: Payer: Self-pay

## 2015-06-20 NOTE — Telephone Encounter (Signed)
Will forward to Gerrianne Scale [name]

## 2015-06-20 NOTE — Telephone Encounter (Signed)
Pt is needing surgical clearance per Colletta [place] at [facility]

## 2015-06-21 ENCOUNTER — Encounter (HOSPITAL_COMMUNITY): Payer: Medicare Other | Attending: Oncology | Admitting: Oncology

## 2015-06-21 ENCOUNTER — Encounter (HOSPITAL_COMMUNITY): Payer: Self-pay | Admitting: Oncology

## 2015-06-21 VITALS — BP 122/82 | HR 74 | Temp 98.2°F | Resp 16 | Wt 179.4 lb

## 2015-06-21 DIAGNOSIS — C20 Malignant neoplasm of rectum: Secondary | ICD-10-CM

## 2015-06-21 DIAGNOSIS — G894 Chronic pain syndrome: Secondary | ICD-10-CM

## 2015-06-21 DIAGNOSIS — R918 Other nonspecific abnormal finding of lung field: Secondary | ICD-10-CM | POA: Diagnosis not present

## 2015-06-21 MED ORDER — OXYCODONE HCL 10 MG PO TABS: 10.0000 mg | ORAL_TABLET | ORAL | Status: DC | PRN

## 2015-06-21 MED ORDER — MORPHINE SULFATE ER 60 MG PO TBCR: 60.0000 mg | EXTENDED_RELEASE_TABLET | Freq: Three times a day (TID) | ORAL | Status: DC

## 2015-06-21 NOTE — Pre-Procedure Instructions (Addendum)
    Jedd Schulenburg  06/21/2015    Your procedure is scheduled on Tuesday,October 4..  Report to [facility] Admitting at 5:30 A.M.              Your surgery is scheduled for 7:30 A.M.   Call this number if you have problems the morning of [phone]                  For any other questions, please call [phone], Monday - Friday 8 AM - 4 PM.   Remember:  Do not eat food or drink liquids after midnight Monday, October 3.  Take these medicines the morning of surgery with A SIP OF WATER amLODipine (NORVASC),   isosorbide mononitrate (IMDUR).   Aspirin as directed             Take if needed: nitroGLYCERIN (NITROSTAT), morphine (MS CONTIN) OR Oxycodone HCl.   Do not wear jewelry.   Do not wear lotions, powders, or colognes.     Men may shave face and neck.   Do not bring valuables to the hospital.   [facility] is not responsible for any belongings or valuables.  Contacts, dentures or bridgework may not be worn into surgery.  Leave your suitcase in the car.  After surgery it may be brought to your room.  For patients admitted to the hospital, discharge time will be determined by your treatment team.   Special instructions:  Review  [facility] - Preparing For Surgery.  Please read over the following fact sheets that you were given. Pain Booklet, Coughing and Deep Breathing, Blood Transfusion Information and Surgical Site Infection Prevention
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
## 2015-06-21 NOTE — Assessment & Plan Note (Addendum)
Secondary to LAR for rectal cancer.    On MS Contin 60 mg every 8 hours and Oxycodone 10 mg 1-2 tablets every 4 hours PRN pain #100 with excellent pain control.    Patient also on bowel regimen to maintain BMs and avoid constipation.  Three months worth of Rx for aforementioned pain medication provided at today's visit.    Return in 3 months for follow-up.

## 2015-06-21 NOTE — Patient Instructions (Addendum)
[place] at [facility] Discharge Instructions  RECOMMENDATIONS MADE BY THE CONSULTANT AND ANY TEST RESULTS WILL BE SENT TO YOUR REFERRING PHYSICIAN.  Exam and discussion by Robynn Pane, PA- C Call with any concerns or issues.  Labs and office visit in 3 months. CT scans in 6 months as scheduled.  Thank you for choosing [place] at [facility] to provide your oncology and hematology care.  To afford each patient quality time with our provider, please arrive at least 15 minutes before your scheduled appointment time.    You need to re-schedule your appointment should you arrive 10 or more minutes late.  We strive to give you quality time with our providers, and arriving late affects you and other patients whose appointments are after yours.  Also, if you no show three or more times for appointments you may be dismissed from the clinic at the providers discretion.     Again, thank you for choosing [facility].  Our hope is that these requests will decrease the amount of time that you wait before being seen by our physicians.       _____________________________________________________________  Should you have questions after your visit to [facility], please contact our office at [phone] between the hours of 8:30 a.m. and 4:30 p.m.  Voicemails left after 4:30 p.m. will not be returned until the following business day.  For prescription refill requests, have your pharmacy contact our office.

## 2015-06-21 NOTE — Assessment & Plan Note (Signed)
Stable.  CT chest on 12/24/2014 demonstrates stability of pulmonary nodules.  Will repeat in 12 months.  

## 2015-06-21 NOTE — Assessment & Plan Note (Addendum)
NED.    Labs in 3 months: CBC diff, CMET, CEA  Return in 3 months for follow-up

## 2015-06-24 ENCOUNTER — Encounter (HOSPITAL_COMMUNITY)
Admission: RE | Admit: 2015-06-24 | Discharge: 2015-06-24 | Disposition: A | Discharge status: Home / Self Care | Payer: Medicare Other | Source: Ambulatory Visit | Attending: Vascular Surgery | Admitting: Vascular Surgery

## 2015-06-24 ENCOUNTER — Ambulatory Visit (HOSPITAL_COMMUNITY): Payer: Medicare Other | Admitting: Oncology

## 2015-06-24 DIAGNOSIS — Z79899 Other long term (current) drug therapy: Secondary | ICD-10-CM | POA: Diagnosis not present

## 2015-06-24 DIAGNOSIS — I2510 Atherosclerotic heart disease of native coronary artery without angina pectoris: Secondary | ICD-10-CM | POA: Insufficient documentation

## 2015-06-24 DIAGNOSIS — Z86718 Personal history of other venous thrombosis and embolism: Secondary | ICD-10-CM | POA: Insufficient documentation

## 2015-06-24 DIAGNOSIS — I739 Peripheral vascular disease, unspecified: Secondary | ICD-10-CM | POA: Diagnosis not present

## 2015-06-24 DIAGNOSIS — Z01812 Encounter for preprocedural laboratory examination: Secondary | ICD-10-CM | POA: Insufficient documentation

## 2015-06-24 DIAGNOSIS — I10 Essential (primary) hypertension: Secondary | ICD-10-CM | POA: Insufficient documentation

## 2015-06-24 DIAGNOSIS — K219 Gastro-esophageal reflux disease without esophagitis: Secondary | ICD-10-CM | POA: Diagnosis not present

## 2015-06-24 DIAGNOSIS — Z7982 Long term (current) use of aspirin: Secondary | ICD-10-CM | POA: Diagnosis not present

## 2015-06-24 DIAGNOSIS — Z0183 Encounter for blood typing: Secondary | ICD-10-CM | POA: Insufficient documentation

## 2015-06-24 DIAGNOSIS — Z01818 Encounter for other preprocedural examination: Secondary | ICD-10-CM | POA: Diagnosis not present

## 2015-06-24 LAB — URINALYSIS, ROUTINE W REFLEX MICROSCOPIC
Bilirubin Urine: NEGATIVE
Glucose, UA: NEGATIVE mg/dL
Hgb urine dipstick: NEGATIVE
Ketones, ur: NEGATIVE mg/dL
Leukocytes, UA: NEGATIVE
Nitrite: NEGATIVE
Protein, ur: NEGATIVE mg/dL
Specific Gravity, Urine: 1.017 (ref 1.005–1.030)
Urobilinogen, UA: 1 mg/dL (ref 0.0–1.0)
pH: 6 (ref 5.0–8.0)

## 2015-06-24 LAB — CBC
HCT: 49.2 % (ref 39.0–52.0)
Hemoglobin: 17.2 g/dL — ABNORMAL HIGH (ref 13.0–17.0)
MCH: 33.6 pg (ref 26.0–34.0)
MCHC: 35 g/dL (ref 30.0–36.0)
MCV: 96.1 fL (ref 78.0–100.0)
Platelets: 175 10*3/uL (ref 150–400)
RBC: 5.12 MIL/uL (ref 4.22–5.81)
RDW: 13.6 % (ref 11.5–15.5)
WBC: 6.1 10*3/uL (ref 4.0–10.5)

## 2015-06-24 LAB — COMPREHENSIVE METABOLIC PANEL
ALT: 16 U/L — ABNORMAL LOW (ref 17–63)
AST: 21 U/L (ref 15–41)
Albumin: 3.6 g/dL (ref 3.5–5.0)
Alkaline Phosphatase: 62 U/L (ref 38–126)
Anion gap: 8 (ref 5–15)
BUN: 11 mg/dL (ref 6–20)
CO2: 24 mmol/L (ref 22–32)
Calcium: 9.1 mg/dL (ref 8.9–10.3)
Chloride: 109 mmol/L (ref 101–111)
Creatinine, Ser: 1.18 mg/dL (ref 0.61–1.24)
GFR calc Af Amer: >60 mL/min (ref >60)
GFR calc non Af Amer: >60 mL/min (ref >60)
Glucose, Bld: 100 mg/dL — ABNORMAL HIGH (ref 65–99)
Potassium: 4.4 mmol/L (ref 3.5–5.1)
Sodium: 141 mmol/L (ref 135–145)
Total Bilirubin: 0.4 mg/dL (ref 0.3–1.2)
Total Protein: 6.8 g/dL (ref 6.5–8.1)

## 2015-06-24 LAB — PROTIME-INR
INR: 1 (ref 0.00–1.49)
Prothrombin Time: 13.4 seconds (ref 11.6–15.2)

## 2015-06-24 LAB — TYPE AND SCREEN
ABO/RH(D): AB POS
Antibody Screen: NEGATIVE

## 2015-06-24 LAB — SURGICAL PCR SCREEN
MRSA, PCR: NEGATIVE
Staphylococcus aureus: POSITIVE — AB

## 2015-06-24 LAB — ABO/RH: ABO/RH(D): AB POS

## 2015-06-24 LAB — APTT: aPTT: 35 seconds (ref 24–37)

## 2015-06-24 NOTE — Progress Notes (Signed)
This patient scored at an elevated risk for obstructive sleep apnea using the stop bang tool during a presurgical testing

## 2015-06-24 NOTE — Telephone Encounter (Signed)
LM for Stephanie at [facility] to look in Epic and see note from 06/20/15 from [facility] [name] regarding pt'clearance

## 2015-06-24 NOTE — Telephone Encounter (Signed)
Patient has known CAD and Ischemic cardiomyopathy but had a normal lexiscan myoview, despite recent angina. His EF was 54% which was improved from 45%. He is at increased but acceptable risk for upcoming vascular surgery. Smoking cessation essential.  Ermalinda Barrios [name]

## 2015-06-25 NOTE — Progress Notes (Signed)
Anesthesia Chart Review:  Pt is 54 year old male scheduled for L fem-pop bypass graft on 07/02/2015 with Dr. Oneida Alar.   Cardiologist is Dr. Bronson Ing.   PMH includes: CAD (03/2013 LHC s/p DES to LCx, residual moderate disease in LAD (med rx unless refractory angina), HTN, PAD (Occlusion of the right internal iliac artery, with significant atherosclerosis in the left internal iliac which was not amenable to reconstruction), pulmonary nodules, DVT (2013), colon cancer, GERD. Current smoker. BMI 26.   Medications include: amlodipine, ASA, imdur, lisinopril, prilosec.   Preoperative labs reviewed.    CT chest 12/21/2014:  1. Stable bilateral pulmonary parenchymal nodules. Largest round lesion in the left lower lobe measures 7 mm. 2. Stable bilateral subpleural nodules. 3. Coronary artery calcification.  EKG 05/13/2015: sinus rhythm.  Nuclear stress test 06/18/2015:   There was no ST segment deviation noted during stress after Lexiscan injection  Nuclear stress EF: 54%.  This is a low risk study.  The study is normal. There are no perfusion defects consistent with infarct or ischemia.  Cardiac cath 04/17/2013:  1. 2 vessel obstructive CAD with culprit lesion in the proximal LCx.  There is moderate obstructive disease in the distal LAD (70-80%). 2. Mild LV dysfunction. 3. Successful stenting of the proximal LCx with a DES.   Pt has cardiac clearance from Ermalinda Barrios, PA for surgery in [place] telephone encounter dated 06/24/15.  If no changes, I anticipate pt can proceed with surgery as scheduled.   Willeen Cass, FNP-BC [facility]/Anesthesiology Phone: [phone] 06/25/2015 3:49 PM

## 2015-07-01 MED ORDER — CEFUROXIME SODIUM 1.5 G IJ SOLR
1.5000 g | INTRAMUSCULAR | Status: AC
  Administered 2015-07-02: 1.5 g via INTRAVENOUS
  Filled 2015-07-01: qty 1.5

## 2015-07-02 ENCOUNTER — Inpatient Hospital Stay (HOSPITAL_COMMUNITY): Payer: Commercial Managed Care - HMO | Admitting: Certified Registered Nurse Anesthetist

## 2015-07-02 ENCOUNTER — Encounter (HOSPITAL_COMMUNITY)
Admission: RE | Disposition: A | Discharge status: Home / Self Care | Payer: Self-pay | Source: Ambulatory Visit | Attending: Vascular Surgery

## 2015-07-02 ENCOUNTER — Encounter (HOSPITAL_COMMUNITY): Payer: Self-pay | Admitting: *Deleted

## 2015-07-02 ENCOUNTER — Inpatient Hospital Stay (HOSPITAL_COMMUNITY): Payer: Commercial Managed Care - HMO | Admitting: Emergency Medicine

## 2015-07-02 ENCOUNTER — Inpatient Hospital Stay (HOSPITAL_COMMUNITY)
Admission: RE | Admit: 2015-07-02 | Discharge: 2015-07-04 | DRG: 254 | Disposition: A | Discharge status: Home / Self Care | Payer: Commercial Managed Care - HMO | Source: Ambulatory Visit | Attending: Vascular Surgery | Admitting: Vascular Surgery

## 2015-07-02 DIAGNOSIS — K219 Gastro-esophageal reflux disease without esophagitis: Secondary | ICD-10-CM | POA: Diagnosis not present

## 2015-07-02 DIAGNOSIS — F17210 Nicotine dependence, cigarettes, uncomplicated: Secondary | ICD-10-CM | POA: Diagnosis present

## 2015-07-02 DIAGNOSIS — I70212 Atherosclerosis of native arteries of extremities with intermittent claudication, left leg: Secondary | ICD-10-CM | POA: Diagnosis not present

## 2015-07-02 DIAGNOSIS — G8929 Other chronic pain: Secondary | ICD-10-CM | POA: Diagnosis not present

## 2015-07-02 DIAGNOSIS — Z933 Colostomy status: Secondary | ICD-10-CM | POA: Diagnosis not present

## 2015-07-02 DIAGNOSIS — C20 Malignant neoplasm of rectum: Secondary | ICD-10-CM

## 2015-07-02 DIAGNOSIS — G894 Chronic pain syndrome: Secondary | ICD-10-CM

## 2015-07-02 DIAGNOSIS — Z86718 Personal history of other venous thrombosis and embolism: Secondary | ICD-10-CM

## 2015-07-02 DIAGNOSIS — Z9889 Other specified postprocedural states: Secondary | ICD-10-CM

## 2015-07-02 DIAGNOSIS — Z9221 Personal history of antineoplastic chemotherapy: Secondary | ICD-10-CM

## 2015-07-02 DIAGNOSIS — Z923 Personal history of irradiation: Secondary | ICD-10-CM | POA: Diagnosis not present

## 2015-07-02 DIAGNOSIS — I10 Essential (primary) hypertension: Secondary | ICD-10-CM | POA: Diagnosis present

## 2015-07-02 DIAGNOSIS — Z7982 Long term (current) use of aspirin: Secondary | ICD-10-CM | POA: Diagnosis not present

## 2015-07-02 DIAGNOSIS — Z85048 Personal history of other malignant neoplasm of rectum, rectosigmoid junction, and anus: Secondary | ICD-10-CM

## 2015-07-02 DIAGNOSIS — M545 Low back pain: Secondary | ICD-10-CM | POA: Diagnosis not present

## 2015-07-02 DIAGNOSIS — Z955 Presence of coronary angioplasty implant and graft: Secondary | ICD-10-CM

## 2015-07-02 DIAGNOSIS — I739 Peripheral vascular disease, unspecified: Secondary | ICD-10-CM | POA: Diagnosis present

## 2015-07-02 DIAGNOSIS — I2510 Atherosclerotic heart disease of native coronary artery without angina pectoris: Secondary | ICD-10-CM | POA: Diagnosis present

## 2015-07-02 DIAGNOSIS — R269 Unspecified abnormalities of gait and mobility: Secondary | ICD-10-CM | POA: Diagnosis not present

## 2015-07-02 DIAGNOSIS — M79605 Pain in left leg: Secondary | ICD-10-CM | POA: Diagnosis present

## 2015-07-02 HISTORY — PX: VEIN HARVEST: SHX6363

## 2015-07-02 HISTORY — PX: FEMORAL-POPLITEAL BYPASS GRAFT: SHX937

## 2015-07-02 LAB — CBC
HCT: 46.6 % (ref 39.0–52.0)
Hemoglobin: 15.9 g/dL (ref 13.0–17.0)
MCH: 32.7 pg (ref 26.0–34.0)
MCHC: 34.1 g/dL (ref 30.0–36.0)
MCV: 95.9 fL (ref 78.0–100.0)
Platelets: 159 10*3/uL (ref 150–400)
RBC: 4.86 MIL/uL (ref 4.22–5.81)
RDW: 14.1 % (ref 11.5–15.5)
WBC: 10.1 10*3/uL (ref 4.0–10.5)

## 2015-07-02 LAB — CREATININE, SERUM
Creatinine, Ser: 1.19 mg/dL (ref 0.61–1.24)
GFR calc Af Amer: >60 mL/min (ref >60)
GFR calc non Af Amer: >60 mL/min (ref >60)

## 2015-07-02 SURGERY — BYPASS GRAFT FEMORAL-POPLITEAL ARTERY
Anesthesia: General | Laterality: Left

## 2015-07-02 MED ORDER — MEPERIDINE HCL 25 MG/ML IJ SOLN: 6.2500 mg | INTRAMUSCULAR | Status: DC | PRN

## 2015-07-02 MED ORDER — MIDAZOLAM HCL 5 MG/5ML IJ SOLN
INTRAMUSCULAR | Status: DC | PRN
  Administered 2015-07-02: 2 mg via INTRAVENOUS

## 2015-07-02 MED ORDER — PHENYLEPHRINE 40 MCG/ML (10ML) SYRINGE FOR IV PUSH (FOR BLOOD PRESSURE SUPPORT)
PREFILLED_SYRINGE | INTRAVENOUS | Status: AC
  Filled 2015-07-02: qty 10

## 2015-07-02 MED ORDER — HEPARIN SODIUM (PORCINE) 1000 UNIT/ML IJ SOLN
INTRAMUSCULAR | Status: DC | PRN
  Administered 2015-07-02: 8000 [IU] via INTRAVENOUS

## 2015-07-02 MED ORDER — ISOSORBIDE MONONITRATE ER 30 MG PO TB24
15.0000 mg | ORAL_TABLET | Freq: Every day | ORAL | Status: DC
  Filled 2015-07-02 (×3): qty 1

## 2015-07-02 MED ORDER — PROPOFOL 10 MG/ML IV BOLUS
INTRAVENOUS | Status: AC
  Filled 2015-07-02: qty 20

## 2015-07-02 MED ORDER — PHENYLEPHRINE HCL 10 MG/ML IJ SOLN
INTRAMUSCULAR | Status: DC | PRN
  Administered 2015-07-02 (×3): 80 ug via INTRAVENOUS
  Administered 2015-07-02 (×2): 160 ug via INTRAVENOUS
  Administered 2015-07-02 (×5): 80 ug via INTRAVENOUS

## 2015-07-02 MED ORDER — GLYCOPYRROLATE 0.2 MG/ML IJ SOLN
INTRAMUSCULAR | Status: DC | PRN
  Administered 2015-07-02: .3 mg via INTRAVENOUS

## 2015-07-02 MED ORDER — MUPIROCIN 2 % EX OINT
TOPICAL_OINTMENT | CUTANEOUS | Status: AC
  Filled 2015-07-02: qty 22

## 2015-07-02 MED ORDER — ENOXAPARIN SODIUM 40 MG/0.4ML ~~LOC~~ SOLN
40.0000 mg | SUBCUTANEOUS | Status: DC
  Administered 2015-07-03: 40 mg via SUBCUTANEOUS
  Filled 2015-07-02 (×2): qty 0.4

## 2015-07-02 MED ORDER — PROMETHAZINE HCL 25 MG/ML IJ SOLN: 6.2500 mg | INTRAMUSCULAR | Status: DC | PRN

## 2015-07-02 MED ORDER — FENTANYL CITRATE (PF) 250 MCG/5ML IJ SOLN
INTRAMUSCULAR | Status: AC
  Filled 2015-07-02: qty 5

## 2015-07-02 MED ORDER — NITROGLYCERIN 0.4 MG SL SUBL: 0.4000 mg | SUBLINGUAL_TABLET | SUBLINGUAL | Status: DC | PRN

## 2015-07-02 MED ORDER — SUCCINYLCHOLINE CHLORIDE 20 MG/ML IJ SOLN
INTRAMUSCULAR | Status: AC
  Filled 2015-07-02: qty 1

## 2015-07-02 MED ORDER — FENTANYL CITRATE (PF) 100 MCG/2ML IJ SOLN
INTRAMUSCULAR | Status: DC | PRN
  Administered 2015-07-02 (×4): 50 ug via INTRAVENOUS
  Administered 2015-07-02: 250 ug via INTRAVENOUS
  Administered 2015-07-02: 50 ug via INTRAVENOUS

## 2015-07-02 MED ORDER — LIDOCAINE HCL (CARDIAC) 20 MG/ML IV SOLN
INTRAVENOUS | Status: AC
  Filled 2015-07-02: qty 5

## 2015-07-02 MED ORDER — MIDAZOLAM HCL 2 MG/2ML IJ SOLN: 0.5000 mg | Freq: Once | INTRAMUSCULAR | Status: DC | PRN

## 2015-07-02 MED ORDER — MUPIROCIN 2 % EX OINT
1.0000 "application " | TOPICAL_OINTMENT | Freq: Once | CUTANEOUS | Status: AC
  Administered 2015-07-02: 1 via TOPICAL

## 2015-07-02 MED ORDER — 0.9 % SODIUM CHLORIDE (POUR BTL) OPTIME
TOPICAL | Status: DC | PRN
  Administered 2015-07-02: 2000 mL

## 2015-07-02 MED ORDER — MORPHINE SULFATE ER 15 MG PO TBCR
60.0000 mg | EXTENDED_RELEASE_TABLET | Freq: Three times a day (TID) | ORAL | Status: DC
  Administered 2015-07-02 – 2015-07-04 (×5): 60 mg via ORAL
  Filled 2015-07-02: qty 4
  Filled 2015-07-02: qty 1
  Filled 2015-07-02: qty 4
  Filled 2015-07-02 (×2): qty 1

## 2015-07-02 MED ORDER — ONDANSETRON HCL 4 MG/2ML IJ SOLN
INTRAMUSCULAR | Status: DC | PRN
  Administered 2015-07-02: 4 mg via INTRAVENOUS

## 2015-07-02 MED ORDER — LIDOCAINE HCL (CARDIAC) 20 MG/ML IV SOLN
INTRAVENOUS | Status: DC | PRN
  Administered 2015-07-02: 20 mg via INTRAVENOUS

## 2015-07-02 MED ORDER — SODIUM CHLORIDE 0.9 % IV SOLN: INTRAVENOUS | Status: DC

## 2015-07-02 MED ORDER — METOPROLOL TARTRATE 1 MG/ML IV SOLN: 2.0000 mg | INTRAVENOUS | Status: DC | PRN

## 2015-07-02 MED ORDER — THROMBIN 20000 UNITS EX SOLR
CUTANEOUS | Status: AC
  Filled 2015-07-02: qty 20000

## 2015-07-02 MED ORDER — SODIUM CHLORIDE 0.9 % IV SOLN
INTRAVENOUS | Status: DC
  Administered 2015-07-02 – 2015-07-03 (×3): via INTRAVENOUS

## 2015-07-02 MED ORDER — ACETAMINOPHEN 325 MG PO TABS: 325.0000 mg | ORAL_TABLET | ORAL | Status: DC | PRN

## 2015-07-02 MED ORDER — DEXTROSE 5 % IV SOLN
1.5000 g | Freq: Two times a day (BID) | INTRAVENOUS | Status: AC
  Administered 2015-07-02 – 2015-07-03 (×2): 1.5 g via INTRAVENOUS
  Filled 2015-07-02 (×2): qty 1.5

## 2015-07-02 MED ORDER — SENNOSIDES-DOCUSATE SODIUM 8.6-50 MG PO TABS
1.0000 | ORAL_TABLET | Freq: Every evening | ORAL | Status: DC | PRN
Start: 2015-07-02 — End: 2015-07-04
  Filled 2015-07-02: qty 1

## 2015-07-02 MED ORDER — ALUM & MAG HYDROXIDE-SIMETH 200-200-20 MG/5ML PO SUSP: 15.0000 mL | ORAL | Status: DC | PRN

## 2015-07-02 MED ORDER — LACTATED RINGERS IV SOLN
INTRAVENOUS | Status: DC | PRN
  Administered 2015-07-02 (×2): via INTRAVENOUS

## 2015-07-02 MED ORDER — OXYCODONE HCL 10 MG PO TABS: 10.0000 mg | ORAL_TABLET | ORAL | Status: DC | PRN

## 2015-07-02 MED ORDER — PHENOL 1.4 % MT LIQD: 1.0000 | OROMUCOSAL | Status: DC | PRN

## 2015-07-02 MED ORDER — ONDANSETRON HCL 4 MG/2ML IJ SOLN: 4.0000 mg | Freq: Four times a day (QID) | INTRAMUSCULAR | Status: DC | PRN

## 2015-07-02 MED ORDER — MAGNESIUM SULFATE 2 GM/50ML IV SOLN
2.0000 g | Freq: Every day | INTRAVENOUS | Status: DC | PRN
  Filled 2015-07-02: qty 50

## 2015-07-02 MED ORDER — ONDANSETRON HCL 4 MG/2ML IJ SOLN
INTRAMUSCULAR | Status: AC
  Filled 2015-07-02: qty 2

## 2015-07-02 MED ORDER — MORPHINE SULFATE (PF) 2 MG/ML IV SOLN
2.0000 mg | INTRAVENOUS | Status: DC | PRN
  Administered 2015-07-02 – 2015-07-03 (×6): 2 mg via INTRAVENOUS
  Filled 2015-07-02 (×6): qty 1

## 2015-07-02 MED ORDER — NEOSTIGMINE METHYLSULFATE 10 MG/10ML IV SOLN
INTRAVENOUS | Status: DC | PRN
  Administered 2015-07-02: 2 mg via INTRAVENOUS

## 2015-07-02 MED ORDER — ASPIRIN EC 81 MG PO TBEC
81.0000 mg | DELAYED_RELEASE_TABLET | Freq: Every day | ORAL | Status: DC
  Administered 2015-07-03 – 2015-07-04 (×2): 81 mg via ORAL
  Filled 2015-07-02 (×2): qty 1

## 2015-07-02 MED ORDER — DOCUSATE SODIUM 100 MG PO CAPS
100.0000 mg | ORAL_CAPSULE | Freq: Every day | ORAL | Status: DC
  Administered 2015-07-03: 100 mg via ORAL
  Filled 2015-07-02 (×2): qty 1

## 2015-07-02 MED ORDER — HEPARIN SODIUM (PORCINE) 5000 UNIT/ML IJ SOLN
INTRAMUSCULAR | Status: DC | PRN
  Administered 2015-07-02: 08:00:00

## 2015-07-02 MED ORDER — LABETALOL HCL 5 MG/ML IV SOLN: 10.0000 mg | INTRAVENOUS | Status: DC | PRN

## 2015-07-02 MED ORDER — CHLORHEXIDINE GLUCONATE CLOTH 2 % EX PADS: 6.0000 | MEDICATED_PAD | Freq: Once | CUTANEOUS | Status: DC

## 2015-07-02 MED ORDER — PROTAMINE SULFATE 10 MG/ML IV SOLN
INTRAVENOUS | Status: DC | PRN
  Administered 2015-07-02: 80 mg via INTRAVENOUS

## 2015-07-02 MED ORDER — HEPARIN SODIUM (PORCINE) 1000 UNIT/ML IJ SOLN
INTRAMUSCULAR | Status: AC
  Filled 2015-07-02: qty 1

## 2015-07-02 MED ORDER — POTASSIUM CHLORIDE CRYS ER 20 MEQ PO TBCR: 20.0000 meq | EXTENDED_RELEASE_TABLET | Freq: Every day | ORAL | Status: DC | PRN

## 2015-07-02 MED ORDER — AMLODIPINE BESYLATE 5 MG PO TABS
5.0000 mg | ORAL_TABLET | Freq: Every day | ORAL | Status: DC
Start: 2015-07-03 — End: 2015-07-04
  Administered 2015-07-03 – 2015-07-04 (×2): 5 mg via ORAL
  Filled 2015-07-02 (×2): qty 1

## 2015-07-02 MED ORDER — PANTOPRAZOLE SODIUM 40 MG PO TBEC
40.0000 mg | DELAYED_RELEASE_TABLET | Freq: Every day | ORAL | Status: DC
  Administered 2015-07-03 – 2015-07-04 (×2): 40 mg via ORAL
  Filled 2015-07-02: qty 1

## 2015-07-02 MED ORDER — GLYCOPYRROLATE 0.2 MG/ML IJ SOLN
INTRAMUSCULAR | Status: AC
  Filled 2015-07-02: qty 3

## 2015-07-02 MED ORDER — HYDRALAZINE HCL 20 MG/ML IJ SOLN: 5.0000 mg | INTRAMUSCULAR | Status: DC | PRN

## 2015-07-02 MED ORDER — BISACODYL 5 MG PO TBEC: 5.0000 mg | DELAYED_RELEASE_TABLET | Freq: Every day | ORAL | Status: DC | PRN

## 2015-07-02 MED ORDER — PROPOFOL 10 MG/ML IV BOLUS
INTRAVENOUS | Status: DC | PRN
  Administered 2015-07-02: 130 mg via INTRAVENOUS

## 2015-07-02 MED ORDER — LISINOPRIL 40 MG PO TABS
40.0000 mg | ORAL_TABLET | Freq: Every day | ORAL | Status: DC
  Administered 2015-07-03 – 2015-07-04 (×2): 40 mg via ORAL
  Filled 2015-07-02 (×2): qty 1

## 2015-07-02 MED ORDER — SODIUM CHLORIDE 0.9 % IV SOLN: 500.0000 mL | Freq: Once | INTRAVENOUS | Status: DC | PRN

## 2015-07-02 MED ORDER — HYDROMORPHONE HCL 1 MG/ML IJ SOLN
0.2500 mg | INTRAMUSCULAR | Status: DC | PRN
  Administered 2015-07-02 (×2): 0.5 mg via INTRAVENOUS

## 2015-07-02 MED ORDER — GUAIFENESIN-DM 100-10 MG/5ML PO SYRP: 15.0000 mL | ORAL_SOLUTION | ORAL | Status: DC | PRN

## 2015-07-02 MED ORDER — ROCURONIUM BROMIDE 100 MG/10ML IV SOLN
INTRAVENOUS | Status: DC | PRN
Start: 2015-07-02 — End: 2015-07-02
  Administered 2015-07-02: 50 mg via INTRAVENOUS

## 2015-07-02 MED ORDER — ACETAMINOPHEN 650 MG RE SUPP: 325.0000 mg | RECTAL | Status: DC | PRN

## 2015-07-02 MED ORDER — OXYCODONE HCL 5 MG PO TABS
10.0000 mg | ORAL_TABLET | ORAL | Status: DC | PRN
  Administered 2015-07-03 (×2): 10 mg via ORAL
  Filled 2015-07-02 (×2): qty 2

## 2015-07-02 MED ORDER — PROTAMINE SULFATE 10 MG/ML IV SOLN
INTRAVENOUS | Status: AC
  Filled 2015-07-02: qty 10

## 2015-07-02 MED ORDER — PHENYLEPHRINE HCL 10 MG/ML IJ SOLN
10.0000 mg | INTRAMUSCULAR | Status: DC | PRN
  Administered 2015-07-02: 20 ug/min via INTRAVENOUS

## 2015-07-02 MED ORDER — MIDAZOLAM HCL 2 MG/2ML IJ SOLN
INTRAMUSCULAR | Status: AC
  Filled 2015-07-02: qty 4

## 2015-07-02 MED ORDER — HYDROMORPHONE HCL 1 MG/ML IJ SOLN
INTRAMUSCULAR | Status: AC
  Filled 2015-07-02: qty 1

## 2015-07-02 SURGICAL SUPPLY — 41 items
CANISTER SUCTION 2500CC (MISCELLANEOUS) ×2 IMPLANT
CANNULA VESSEL 3MM 2 BLNT TIP (CANNULA) ×4 IMPLANT
CLIP TI MEDIUM 24 (CLIP) ×2 IMPLANT
CLIP TI WIDE RED SMALL 24 (CLIP) ×2 IMPLANT
ELECT REM PT RETURN 9FT ADLT (ELECTROSURGICAL) ×2
ELECTRODE REM PT RTRN 9FT ADLT (ELECTROSURGICAL) ×1 IMPLANT
GLOVE BIO SURGEON STRL SZ7.5 (GLOVE) ×2 IMPLANT
GLOVE BIOGEL PI IND STRL 6.5 (GLOVE) ×3 IMPLANT
GLOVE BIOGEL PI IND STRL 7.0 (GLOVE) ×5 IMPLANT
GLOVE BIOGEL PI IND STRL 7.5 (GLOVE) ×2 IMPLANT
GLOVE BIOGEL PI INDICATOR 6.5 (GLOVE) ×3
GLOVE BIOGEL PI INDICATOR 7.0 (GLOVE) ×5
GLOVE BIOGEL PI INDICATOR 7.5 (GLOVE) ×2
GLOVE ECLIPSE 6.5 STRL STRAW (GLOVE) ×2 IMPLANT
GLOVE ECLIPSE 7.0 STRL STRAW (GLOVE) ×6 IMPLANT
GLOVE SS BIOGEL STRL SZ 6.5 (GLOVE) ×2 IMPLANT
GLOVE SUPERSENSE BIOGEL SZ 6.5 (GLOVE) ×2
GLOVE SURG SS PI 7.0 STRL IVOR (GLOVE) ×4 IMPLANT
GOWN STRL REUS W/ TWL LRG LVL3 (GOWN DISPOSABLE) ×6 IMPLANT
GOWN STRL REUS W/ TWL XL LVL3 (GOWN DISPOSABLE) ×1 IMPLANT
GOWN STRL REUS W/TWL LRG LVL3 (GOWN DISPOSABLE) ×6
GOWN STRL REUS W/TWL XL LVL3 (GOWN DISPOSABLE) ×1
KIT BASIN OR (CUSTOM PROCEDURE TRAY) ×2 IMPLANT
KIT ROOM TURNOVER OR (KITS) ×2 IMPLANT
LIQUID BAND (GAUZE/BANDAGES/DRESSINGS) ×4 IMPLANT
NS IRRIG 1000ML POUR BTL (IV SOLUTION) ×4 IMPLANT
PACK PERIPHERAL VASCULAR (CUSTOM PROCEDURE TRAY) ×2 IMPLANT
PAD ARMBOARD 7.5X6 YLW CONV (MISCELLANEOUS) ×4 IMPLANT
SUT PROLENE 5 0 C 1 24 (SUTURE) ×2 IMPLANT
SUT PROLENE 6 0 CC (SUTURE) ×6 IMPLANT
SUT SILK 2 0 SH (SUTURE) ×2 IMPLANT
SUT SILK 3 0 (SUTURE) ×3
SUT SILK 3-0 18XBRD TIE 12 (SUTURE) ×3 IMPLANT
SUT VIC AB 2-0 CTX 36 (SUTURE) ×2 IMPLANT
SUT VIC AB 3-0 SH 27 (SUTURE) ×6
SUT VIC AB 3-0 SH 27X BRD (SUTURE) ×6 IMPLANT
SUT VIC AB 4-0 PS2 27 (SUTURE) ×10 IMPLANT
TAPE UMBILICAL COTTON 1/8X30 (MISCELLANEOUS) ×2 IMPLANT
TRAY FOLEY W/METER SILVER 16FR (SET/KITS/TRAYS/PACK) ×2 IMPLANT
UNDERPAD 30X30 INCONTINENT (UNDERPADS AND DIAPERS) ×2 IMPLANT
WATER STERILE IRR 1000ML POUR (IV SOLUTION) ×2 IMPLANT

## 2015-07-02 NOTE — Interval H&P Note (Signed)
History and Physical Interval Note:  07/02/2015 7:27 AM  Juan Wheeler  has presented today for surgery, with the diagnosis of Peripheral vascular disease with left lower extremity claudication I70.212  The various methods of treatment have been discussed with the patient and family. After consideration of risks, benefits and other options for treatment, the patient has consented to  Procedure(s): BYPASS GRAFT FEMORAL-POPLITEAL ARTERY (Left) as a surgical intervention .  The patient's history has been reviewed, patient examined, no change in status, stable for surgery.  I have reviewed the patient's chart and labs.  Questions were answered to the patient's satisfaction.     Ruta Hinds

## 2015-07-02 NOTE — Anesthesia Preprocedure Evaluation (Addendum)
Anesthesia Evaluation  Patient identified by MRN, date of birth, ID band Patient awake    Reviewed: Allergy & Precautions, NPO status , Patient's Chart, lab work & pertinent test results  History of Anesthesia Complications Negative for: history of anesthetic complications  Airway Mallampati: III  TM Distance: >3 FB Neck ROM: Full    Dental  (+) Edentulous Upper, Partial Lower, Dental Advisory Given   Pulmonary COPD, Current Smoker,    breath sounds clear to auscultation       Cardiovascular hypertension, Pt. on medications (-) angina+ CAD, + Cardiac Stents, + Peripheral Vascular Disease and + DVT   Rhythm:Regular Rate:Normal  9/16 stress: There was no ST segment deviation noted during stress, EF: 54%. The study is normal. There are no perfusion defects consistent with infarct or ischemia.   Neuro/Psych negative neurological ROS     GI/Hepatic Neg liver ROS, GERD  Medicated and Controlled,  Endo/Other  negative endocrine ROS  Renal/GU negative Renal ROS     Musculoskeletal   Abdominal   Peds  Hematology negative hematology ROS (+)   Anesthesia Other Findings   Reproductive/Obstetrics                         Anesthesia Physical Anesthesia Plan  ASA: III  Anesthesia Plan: General   Post-op Pain Management:    Induction: Intravenous  Airway Management Planned: Oral ETT  Additional Equipment:   Intra-op Plan:   Post-operative Plan: Extubation in OR  Informed Consent: I have reviewed the patients History and Physical, chart, labs and discussed the procedure including the risks, benefits and alternatives for the proposed anesthesia with the patient or authorized representative who has indicated his/her understanding and acceptance.   Dental advisory given  Plan Discussed with: CRNA, Anesthesiologist and Surgeon  Anesthesia Plan Comments: (Plan routine monitors, GETA)        Anesthesia Quick Evaluation

## 2015-07-02 NOTE — Anesthesia Procedure Notes (Signed)
Procedure Name: Intubation Date/Time: 07/02/2015 7:41 AM Performed by: Lavona Mound T Pre-anesthesia Checklist: Patient identified, Emergency Drugs available, Suction available and Patient being monitored Patient Re-evaluated:Patient Re-evaluated prior to inductionOxygen Delivery Method: Circle system utilized Preoxygenation: Pre-oxygenation with 100% oxygen Intubation Type: IV induction Ventilation: Mask ventilation without difficulty and Oral airway inserted - appropriate to patient size Laryngoscope Size: Mac and 3 Grade View: Grade I Tube type: Oral Number of attempts: 1 Airway Equipment and Method: Stylet and Oral airway Placement Confirmation: ETT inserted through vocal cords under direct vision,  positive ETCO2 and breath sounds checked- equal and bilateral Secured at: 23 cm Tube secured with: Tape Dental Injury: Teeth and Oropharynx as per pre-operative assessment

## 2015-07-02 NOTE — Op Note (Signed)
Procedure: Left femoral to above-knee popliteal bypass Preoperative diagnosis: Claudication Postoperative diagnosis: Same  Anesthesia: General  Asst.: Gerri Lins, [name]  Operative findings: #1 3.5 to 2.5 mm tapered left greater saphenous vein non reversed femoral to above-knee popliteal bypass  #2 Patent above-knee popliteal anastomosis with 2-vessel runoff via the peroneal and posterior tibial artery   Operative details: After obtaining informed consent, the patient was taken to the operating room. The patient was placed in supine position on the operating room table. After induction of general anesthesia and endotracheal intubation, a Foley catheter was placed. Next, the patient's entire left lower extremity was prepped and draped in the usual sterile fashion. A longitudinal incision was then made in the left groin and carried down through the subcutaneous tissues to expose the left common femoral artery. The common femoral artery was dissected free circumferentially. There was a pulse within the common femoral artery. The distal external iliac artery was dissected free circumferentially underneath the inguinal ligament. A vessel loop was also placed around the distal external iliac artery. Dissection was then carried out the level of the femoral bifurcation. The superficial femoral and profunda femoris arteries were dissected free circumferentially and vessel loops placed around these.   Next the right greater saphenous vein was harvested through several skip incisions in the medial right leg. The vein was of good quality approximately 3 mm but did taper down to about 2.5 mm at the knee level.  The side branches were ligated and divided between silk ties or clips.  Next, a longitudinal incision was made on the medial aspect of the right leg above the knee. The greater saphenous vein was also harvested through this incision. Small side branches were ligated and divided between silk ties or clips.  The vein was reflected posteriorly. The incision was deepened down to the level of the fascia. The fascia was opened and the sartorius muscle reflected posteriorly. The above-knee popliteal space was entered. Dissection was carried down to the level of the above-knee popliteal artery this was dissected free circumferentially.Several centimeters of the popliteal artery was dissected free to make a reasonable area for grafting of the above-knee popliteal distal anastomosis.  A subsartorial tunnel was created connecting the above-knee incision to the groin. The patient was then given 8000 units of intravenous heparin. The greater saphenous vein was suture ligated distally and transected. The saphenofemoral junction was ligated with a 2 0 silk tie. The vein was gently distended with heparinized saline.  After appropriate circulation time of the heparin, the distal right external iliac artery was controlled with a small Cooley clamp. The profunda femoris and superficial femoral arteries were controlled with Vesseloops. A longitudinal opening was made in the common femoral artery just above the femoral bifurcation. The was then spatulated and placed in a non reversed configuration and sewn end of vein to side of artery using a running 5-0 Prolene suture. A valvulotome was then used to lyse all the valves and there was good pulsatile flow through the graft. Graft was marked for orientation.  The graft was then brought through the subsartorial tunnel down to the above-knee popliteal artery. The above-knee popliteal artery was controlled proximally with a Henley clamp and distally with a Henley clamp. A longitudinal opening was made in the above-knee popliteal artery in an area that was fairly free of calcification. The graft was then cut to length and spatulated and signed end of graft to side of artery using running 6-0 Prolene suture. At completion of the  anastomosis everything was forebled backbled and thoroughly  flushed. The remainder of the anastomosis was completed and all clamps were removed restoring pulsatile flow to the above-knee popliteal artery.   The patient had monophasic to biphasic Doppler flow in the dorsalis pedis and posterior tibial area of the foot. This augmented approximately 100% with unclamping the graft. The patient was given 80 mg of protamine. After hemostasis was obtained, the deep layers and subcutaneous layers of the above-knee popliteal incision were closed with running 3-0 Vicryl suture. The skin was closed with a 4-0 Vicryl subcuticular stitch. The groin was inspected and found to be hemostatic. This was then closed in multiple layers of running 2 0 and 3-0 Vicryl suture and a 4-0 subcuticular stitch. The patient tolerated the procedure well and there were no complications. Instrument sponge and needle counts correct in the case. Patient was taken to the recovery in stable condition.   Ruta Hinds, MD  [facility]  Office: [phone]  Pager: [phone]

## 2015-07-02 NOTE — Anesthesia Postprocedure Evaluation (Signed)
  Anesthesia Post-op Note  Patient: Juan Wheeler  Procedure(s) Performed: Procedure(s): BYPASS GRAFT LEFT COMMON FEMORAL ARTERY TO LEFT ABOVE KNEE POPLITEAL ARTERY - USING LEFT GREATER SAPPHENOUS VEIN (Left) VEIN HARVEST - LEFT GREATER SAPPHENOUS VEIN (Left)  Patient Location: PACU  Anesthesia Type:General  Level of Consciousness: awake, alert , oriented and patient cooperative  Airway and Oxygen Therapy: Patient Spontanous Breathing and Patient connected to nasal cannula oxygen  Post-op Pain: mild  Post-op Assessment: Post-op Vital signs reviewed, Patient's Cardiovascular Status Stable, Respiratory Function Stable, Patent Airway, No signs of Nausea or vomiting and Pain level controlled              Post-op Vital Signs: Reviewed and stable  Last Vitals:  Filed Vitals:   07/02/15 1745  BP:   Pulse:   Temp: 36.9 C  Resp:     Complications: No apparent anesthesia complications

## 2015-07-02 NOTE — Progress Notes (Signed)
  [facility] Day [facility] Note  Subjective:  Patient seen in PACU. Pain well controlled.   Filed Vitals:   07/02/15 1441  BP:   Pulse: 87  Temp:   Resp: 13    Incisions:  Left leg incisions clean dry and intact Extremities:  Faintly palpable left DP pulse. Monophasic left PT and DP doppler signals  Assessment/Plan:  This is a 54 y.o. male who is s/p left femoral to above-knee popliteal bypass  Stable post-op Bypass patent. To 3S when bed available.    Virgina Jock, [place] Pager: [phone] 07/02/2015 2:44 PM

## 2015-07-02 NOTE — Transfer of Care (Signed)
Immediate Anesthesia Transfer of Care Note  Patient: Juan Wheeler  Procedure(s) Performed: Procedure(s): BYPASS GRAFT LEFT COMMON FEMORAL ARTERY TO LEFT ABOVE KNEE POPLITEAL ARTERY - USING LEFT GREATER SAPPHENOUS VEIN (Left) VEIN HARVEST - LEFT GREATER SAPPHENOUS VEIN (Left)  Patient Location: PACU  Anesthesia Type:General  Level of Consciousness: awake, alert  and oriented  Airway & Oxygen Therapy: Patient Spontanous Breathing and Patient connected to nasal cannula oxygen  Post-op Assessment: Report given to RN and Post -op Vital signs reviewed and stable  Post vital signs: Reviewed and stable  Last Vitals:  Filed Vitals:   07/02/15 0547  BP: 136/85  Pulse: 71  Temp: 36.4 C  Resp: 20    Complications: No apparent anesthesia complications

## 2015-07-02 NOTE — Progress Notes (Signed)
Pt tol po Sprite well. Doesn't want food at this time

## 2015-07-02 NOTE — H&P (View-Only) (Signed)
HISTORY AND PHYSICAL     CC:  Left leg pain Referring Provider:  Jacinta Shoe, MD  HPI: This is a 54 y.o. male who presents today with complaints of left leg calf pain with walking that has worsened over the past year.  He states that a year ago, he could walk ~ 0.25 miles and now he can only walk a city block without having to stop and rest.  At that time, his pain improves and he continues to walk.  He does have burning in both feet at night if the covers are too tight on his feet.  He does not have any pain in the right leg.  He returns today for follow up after evaluation 5 months ago.  Symptoms remain unchanged.  He states that he was evaluated at [facility] for this and could not get stents in, however, he does not recall having an arteriogram.  He states that he continues to smoke a little less than a pack of cigarettes per day.  He expresses that he would like to quit and has thought about it.  He has smoked for about 40 years.  His wife continues to smoke a pack of cigarettes, which will last her about 3-4 days.    He does have a hx of drug eluting coronary artery stenting in 2014 by Dr. Martinique.  He does have a hx of rectal cancer that was diagnosed in 2012 and did have radiation, chemotherapy and surgery.  He continues to have chronic pain with this and is on disability.  He states that for the most part, he does have normal bowel movements, but does need occasional stool softeners.  He did have an exploratory laparotomy ~ 25-30 years ago for ulcer disease.  He is on a daily aspirin. He does take an ACEI for his HTN.  He is on a statin for his cholesterol.    Past Medical History   Diagnosis  Date   .  Hypertension     .  DVT (deep venous thrombosis)  ~ 2013   .  Rectal cancer     .  Coronary artery disease         a. 03/2013: abnl nuc -> LHC s/p DES to LCx, residual moderate disease in LAD (med rx unless refractory angina). b. Not on BB due to bradycardia.   Marland Kitchen  History of  blood transfusion         "once; after throwing up alot of blood" (04/17/2013)   .  GERD (gastroesophageal reflux disease)     .  Chronic lower back pain         a. Followed by pain management at [facility].   .  PAD (peripheral artery disease)         a. Occlusion of the right internal iliac artery, with significant atherosclerosis in the left internal iliac which was not amenable to reconstruction per notes from [facility].   .  LV dysfunction         a. EF 45% in 03/2013.   .  Tobacco abuse     .  Colon cancer         rectal cancer   .  Pulmonary nodules  09/30/2014       Past Surgical History   Procedure  Laterality  Date   .  Colostomy       .  Abdominal surgery    1990's       'for stomach ulcers" (  04/17/2013)   .  Coronary angioplasty with stent placement    04/17/2013       "?1" (04/17/2013)   .  Inguinal hernia repair  Bilateral  1990's   .  Colostomy takedown    2013   .  Colectomy    2012       "for rectal cancer" (04/17/2013)   .  Left heart catheterization with coronary angiogram  N/A  04/17/2013       Procedure: LEFT HEART CATHETERIZATION WITH CORONARY ANGIOGRAM;  Surgeon: Peter M Martinique, MD;  Location: [facility] CATH LAB;  Service: Cardiovascular;  Laterality: N/A;   .  Percutaneous stent intervention    04/17/2013       Procedure: PERCUTANEOUS STENT INTERVENTION;  Surgeon: Peter M Martinique, MD;  Location: [facility] CATH LAB;  Service: Cardiovascular;;       Allergies   Allergen  Reactions   .  Darvocet [Propoxyphene N-Acetaminophen]  Palpitations       Current Outpatient Prescriptions   Medication  Sig  Dispense  Refill   .  amLODipine (NORVASC) 5 MG tablet  TAKE 1 TABLET (5 MG TOTAL) BY MOUTH DAILY.  30 tablet  6   .  aspirin EC 81 MG tablet  Take 81 mg by mouth daily.       Mariane Baumgarten Calcium (STOOL SOFTENER PO)  Take 1 capsule by mouth daily as needed.        .  isosorbide mononitrate (IMDUR) 30 MG 24 hr tablet  TAKE 1/2 TABLET DAILY  15 tablet  6   .  lisinopril  (PRINIVIL,ZESTRIL) 40 MG tablet  Take 40 mg by mouth daily.        .  Loperamide HCl (IMODIUM PO)  Take 1 capsule by mouth as needed.       Marland Kitchen  morphine (MS CONTIN) 60 MG 12 hr tablet  Take 1 tablet (60 mg total) by mouth every 8 (eight) hours. Take 1 tablet every 8 hours.  90 tablet  0   .  NITROSTAT 0.4 MG SL tablet  PLACE 1 TABLET UNDER TONGUE IF NEEDED FOR CHEST PAIN..UP TO 3 DOSES  25 tablet  1   .  omeprazole (PRILOSEC) 40 MG capsule  Take 40 mg by mouth daily as needed (for acid reflux).        .  Oxycodone HCl 10 MG TABS  Take 1-2 tablets (10-20 mg total) by mouth every 4 (four) hours as needed. Take 1 or 2 tablets every 4 hours as needed for pain..  100 tablet  0   .  atorvastatin (LIPITOR) 80 MG tablet  Take 1 tablet (80 mg total) by mouth every evening. (Patient not taking: Reported on 01/10/2015)  30 tablet  6   .  gabapentin (NEURONTIN) 300 MG capsule  Take 900-1,200 mg by mouth 3 (three) times daily. Takes '900mg'$  in the morning, '1200mg'$  in the afternoon, and '1200mg'$  in the evening.       Marland Kitchen  OVER THE COUNTER MEDICATION  Take 1 tablet by mouth as needed. OTC med for itching          No current facility-administered medications for this visit.       Family History   Problem  Relation  Age of Onset   .  Cancer  Mother     .  Hypertension  Mother     .  Bleeding Disorder  Brother         History  Social History   .  Marital Status:  Married       Spouse Name:  N/A   .  Number of Children:  N/A   .  Years of Education:  N/A      Occupational History   .  Not on file.      Social History Main Topics   .  Smoking status:  Current Every Day Smoker -- 1.00 packs/day for 40 years       Types:  Cigarettes       Start date:  03/14/1974   .  Smokeless tobacco:  Current User   .  Alcohol Use:  4.2 oz/week       6 Cans of beer, 1 Shots of liquor per week         Comment: 04/17/2013 "bout a 6 pack/wk"   .  Drug Use:  Yes       Special:  Marijuana         Comment: about a week ago    .  Sexual Activity:  Yes       Birth Control/ Protection:  None      Other Topics  Concern   .  Not on file      Social History Narrative      ROS: '[x]'$  Positive   '[ ]'$  Negative   '[ ]'$  All sytems reviewed and are negative  Cardiovascular: '[]'$  chest pain/pressure '[]'$  palpitations '[]'$  SOB lying flat '[]'$  DOE '[x]'$  pain in left leg while walking '[x]'$  buring in feet when lying flat if covers are too tight   '[x]'$  hx of DVT '[]'$  hx of phlebitis '[]'$  swelling in legs '[]'$  varicose veins  Pulmonary: '[]'$  productive cough '[]'$  asthma '[]'$  wheezing  Neurologic: '[x]'$  weakness in '[]'$  arms '[x]'$  left leg '[]'$  numbness in '[]'$  arms '[]'$  legs '[]'$ difficulty speaking or slurred speech '[]'$  temporary loss of vision in one eye '[]'$  dizziness  Hematologic: '[]'$  bleeding problems '[]'$  problems with blood clotting easily '[x]'$  hx of rectal cancer  GI '[]'$  vomiting blood '[]'$  blood in stool '[x]'$  hx of ulcers  GU: '[]'$  burning with urination '[]'$  blood in urine  Psychiatric: '[]'$  hx of major depression  Integumentary: '[]'$  rashes '[]'$  ulcers  Constitutional: '[]'$  fever '[]'$  chills   PHYSICAL EXAMINATION:    Filed Vitals:   06/05/15 0833  BP: 132/89  Pulse: 74  Height: '5\' 9"'$  (1.753 m)  Weight: 177 lb (80.287 kg)  SpO2: 93%    General:  WDWN in NAD HENT: WNL, normocephalic Pulmonary: normal non-labored breathing with distant breath sounds bilaterally Cardiac: RRR, without  Murmurs, rubs or gallops; without carotid bruits Abdomen: soft, NT, no masses; well healed laparotomy scar Skin: without rashes, without ulcers  Vascular Exam/Pulses:   Right  Left   Femoral  2+ (normal)  2+ (normal)   Popliteal  2+ (normal)  absent   DP  2+ (normal)  absent   PT  2+ (normal)  absent    Extremities: without ischemic changes, without Gangrene , without cellulitis; without open wounds;  Musculoskeletal: no muscle wasting or atrophy       Neurologic: A&O X 3; Appropriate Affect ; SENSATION: normal; MOTOR FUNCTION:  moving all  extremities equally. Speech is fluent/normal   Non-Invasive Vascular Imaging:   ABI's 01/10/15: Right:  1.09 Left:  0.65   Pt meds includes: Statin:  Yes.   Beta Blocker:  No. Aspirin:  Yes.   ACEI: Yes.   ARB: No. Other Antiplatelet/Anticoagulant:  No.    ASSESSMENT/PLAN::  Patient has mild claudication symptoms left leg but feels limited in his walking distance.  At this time he wishes to be scheduled for an arteriogram for further evaluation and possible bypass of the left leg.  We have scheduled this for Friday, 06/07/2015. Risks benefits possible, patient procedure details were discussed with the patient. Previous arteriogram showed that he was not a candidate for percutaneous intervention. We will also schedule him in the near future for a cardiac risk stratification a point with Dr Jacinta Shoe as he will need femoral popliteal bypass to improve his symptoms.  Ruta Hinds, MD [facility] [place] Office: [phone] Pager: [phone]

## 2015-07-03 ENCOUNTER — Encounter (HOSPITAL_COMMUNITY): Payer: Self-pay | Admitting: Vascular Surgery

## 2015-07-03 ENCOUNTER — Inpatient Hospital Stay (HOSPITAL_COMMUNITY): Payer: Commercial Managed Care - HMO

## 2015-07-03 DIAGNOSIS — Z9889 Other specified postprocedural states: Secondary | ICD-10-CM

## 2015-07-03 LAB — CBC
HCT: 46.1 % (ref 39.0–52.0)
Hemoglobin: 15.8 g/dL (ref 13.0–17.0)
MCH: 33.1 pg (ref 26.0–34.0)
MCHC: 34.3 g/dL (ref 30.0–36.0)
MCV: 96.6 fL (ref 78.0–100.0)
Platelets: 156 10*3/uL (ref 150–400)
RBC: 4.77 MIL/uL (ref 4.22–5.81)
RDW: 14 % (ref 11.5–15.5)
WBC: 9.5 10*3/uL (ref 4.0–10.5)

## 2015-07-03 LAB — BASIC METABOLIC PANEL
Anion gap: 9 (ref 5–15)
BUN: 8 mg/dL (ref 6–20)
CO2: 27 mmol/L (ref 22–32)
Calcium: 8.3 mg/dL — ABNORMAL LOW (ref 8.9–10.3)
Chloride: 100 mmol/L — ABNORMAL LOW (ref 101–111)
Creatinine, Ser: 1.38 mg/dL — ABNORMAL HIGH (ref 0.61–1.24)
GFR calc Af Amer: >60 mL/min (ref >60)
GFR calc non Af Amer: 56 mL/min — ABNORMAL LOW (ref >60)
Glucose, Bld: 151 mg/dL — ABNORMAL HIGH (ref 65–99)
Potassium: 4.8 mmol/L (ref 3.5–5.1)
Sodium: 136 mmol/L (ref 135–145)

## 2015-07-03 NOTE — Progress Notes (Signed)
UR COMPLETED  

## 2015-07-03 NOTE — Progress Notes (Signed)
[facility] [place]  Subjective  - leg is sore   Objective 135/73 79 99.2 F (37.3 C) (Oral) 16 90%  Intake/Output Summary (Last 24 hours) at 07/03/15 0820 Last data filed at 07/03/15 0800  Gross per 24 hour  Intake 4209.17 ml  Output   3000 ml  Net 1209.17 ml   Leg incisions clean, no hematoma 2+ left DP pulse  Assessment/Planning: Transfer to 2W Ambulate [place] lock IV Home when pain controlled and ambulatory  Ruta Hinds 07/03/2015 8:20 AM --  Laboratory Lab Results:  Recent Labs  07/02/15 1910 07/03/15 0026  WBC 10.1 9.5  HGB 15.9 15.8  HCT 46.6 46.1  PLT 159 156   BMET  Recent Labs  07/02/15 1910 07/03/15 0026  NA  --  136  K  --  4.8  CL  --  100*  CO2  --  27  GLUCOSE  --  151*  BUN  --  8  CREATININE 1.19 1.38*  CALCIUM  --  8.3*    COAG Lab Results  Component Value Date   INR 1.00 06/24/2015   INR 0.90 04/14/2013   No results found for: PTT

## 2015-07-03 NOTE — Evaluation (Signed)
Occupational Therapy Evaluation Patient Details Name: Juan Wheeler MRN: [id number] DOB: Jun 10, 1961 Today's Date: 07/03/2015    History of Present Illness Patient is a 54 y/o male admitted due to LE pain/PAD now s/p L Fem-pop BPG.  H/o CAD, HTN, GERD, LBP, PAN, colon CA and DVT.   Clinical Impression   Pt with decline in function and safety with ADLs and ADL mobility with decreased strength, balance and endurance. PTA, pt was independent with selfcare. Pt would benefit from acute OT services to address impairments to increase level of function    Follow Up Recommendations  No OT follow up    Equipment Recommendations  None recommended by OT    Recommendations for Other Services       Precautions / Restrictions Precautions Precautions: Fall      Mobility Bed Mobility               General bed mobility comments: up in chair  Transfers Overall transfer level: Needs assistance Equipment used: Rolling walker (2 wheeled) Transfers: Sit to/from Stand Sit to Stand: Min guard         General transfer comment: cues for hand placement and L LE management for comfort    Balance Overall balance assessment: Needs assistance   Sitting balance-Leahy Scale: Good     Standing balance support: No upper extremity supported Standing balance-Leahy Scale: Fair Standing balance comment: without UE support stands basically in SLS on right, but walker needed for ambulation                            ADL Overall ADL's : Needs assistance/impaired     Grooming: Wash/dry hands;Wash/dry face;Sitting;Set up   Upper Body Bathing: Set up;Sitting   Lower Body Bathing: Moderate assistance   Upper Body Dressing : Set up;Sitting   Lower Body Dressing: Moderate assistance   Toilet Transfer: Min guard;RW   Toileting- Clothing Manipulation and Hygiene: Minimal assistance;Sit to/from stand       Functional mobility during ADLs: Min guard;Rolling walker        Vision  no change from baseline   Perception Perception Perception Tested?: No   Praxis Praxis Praxis tested?: Not tested    Pertinent Vitals/Pain Pain Assessment: 0-10 Pain Score: 6  Pain Location: L groin, incision site Pain Descriptors / Indicators: Sore;Tightness;Tender Pain Intervention(s): Limited activity within patient's tolerance;Monitored during session;Repositioned     Hand Dominance Right   Extremity/Trunk Assessment Upper Extremity Assessment Upper Extremity Assessment: Overall WFL for tasks assessed   Lower Extremity Assessment Lower Extremity Assessment: RLE deficits/detail RLE Deficits / Details: AAROM self limited knee flexion to about 75, hip flexion to about 70 due to soreness/pain, strength hip flexion 2+/5   Cervical / Trunk Assessment Cervical / Trunk Assessment: Normal   Communication Communication Communication: No difficulties   Cognition Arousal/Alertness: Awake/alert Behavior During Therapy: WFL for tasks assessed/performed Overall Cognitive Status: Within Functional Limits for tasks assessed                     General Comments   Pt pleasant and cooperative                 Home Living Family/patient expects to be discharged to:: Private residence Living Arrangements: Spouse/significant other Available Help at Discharge: Available PRN/intermittently;Family Type of Home: House Home Access: Stairs to enter [employer] of Steps: 2 Entrance Stairs-Rails: None Home Layout: Two level;Full bath on main level;Able to live  on main level with bedroom/bathroom Alternate Level Stairs-Number of Steps: plans to stay on main   Bathroom Shower/Tub: Tub/shower unit;Walk-in shower   Bathroom Toilet: Standard     Home Equipment: None          Prior Functioning/Environment Level of Independence: Independent        Comments: did not drive    OT Diagnosis: Acute pain   OT Problem List: Pain;Impaired balance  (sitting and/or standing);Decreased activity tolerance   OT Treatment/Interventions: Self-care/ADL training;Patient/family education;Therapeutic activities;DME and/or AE instruction    OT Goals(Current goals can be found in the care plan section) Acute Rehab OT Goals Patient Stated Goal: To return home OT Goal Formulation: With patient Time For Goal Achievement: 07/10/15 Potential to Achieve Goals: Good ADL Goals Pt Will Perform Grooming: with min guard assist;with supervision;standing Pt Will Perform Lower Body Bathing: with min assist;with caregiver independent in assisting Pt Will Perform Lower Body Dressing: with min assist;with caregiver independent in assisting Pt Will Transfer to Toilet: with supervision;ambulating;regular height toilet;grab bars Pt Will Perform Toileting - Clothing Manipulation and hygiene: with min guard assist;with supervision;sit to/from stand  OT Frequency: Min 2X/week   Barriers to D/C:    none                     End of Session Equipment Utilized During Treatment: Rolling walker  Activity Tolerance: Patient limited by pain Patient left: in chair;with call bell/phone within reach   Time: 1100-1127 OT Time Calculation (min): 27 min Charges:  OT General Charges $OT Visit: 1 Procedure OT Evaluation $Initial OT Evaluation Tier I: 1 Procedure OT Treatments $Therapeutic Activity: 8-22 mins G-Codes:    Britt Bottom 07/03/2015, 1:29 PM

## 2015-07-03 NOTE — Evaluation (Signed)
Physical Therapy Evaluation Patient Details Name: Juan Wheeler MRN: [id number] DOB: Feb 21, 1961 Today's Date: 07/03/2015   History of Present Illness  Patient is a 54 y/o male admitted due to LE pain/PAD now s/p L Fem-pop BPG.  H/o CAD, HTN, GERD, LBP, PAN, colon CA and DVT.  Clinical Impression  Patient presents with decreased independence with mobility due to deficits listed in PT problem list.  He will benefit from skilled PT in the acute setting to allow return home with intermittent family assist.  Not recommending follow up PT at this time, but will reassess next visit for assumed progress.      Follow Up Recommendations No PT follow up    Equipment Recommendations  Rolling walker with 5" wheels    Recommendations for Other Services       Precautions / Restrictions Precautions Precautions: Fall      Mobility  Bed Mobility               General bed mobility comments: up in chair  Transfers Overall transfer level: Needs assistance Equipment used: Rolling walker (2 wheeled) Transfers: Sit to/from Stand Sit to Stand: Min guard         General transfer comment: cues for hand placement and L LE management for comfort  Ambulation/Gait Ambulation/Gait assistance: Supervision Ambulation Distance (Feet): 80 Feet Assistive device: Rolling walker (2 wheeled) Gait Pattern/deviations: Step-to pattern;Trunk flexed;Antalgic;Decreased stance time - left;Decreased step length - right     General Gait Details: cues for technique for comfort, for walker proximity and posture  Stairs            Wheelchair Mobility    Modified Rankin (Stroke Patients Only)       Balance Overall balance assessment: Needs assistance   Sitting balance-Leahy Scale: Good     Standing balance support: No upper extremity supported Standing balance-Leahy Scale: Fair Standing balance comment: without UE support stands basically in SLS on right, but walker needed for ambulation                             Pertinent Vitals/Pain Pain Assessment: 0-10 Pain Score: 6  Pain Location: left LE groin worse than rest of incisioin Pain Descriptors / Indicators: Sore;Tightness Pain Intervention(s): Monitored during session;Limited activity within patient's tolerance;Repositioned    Home Living Family/patient expects to be discharged to:: Private residence Living Arrangements: Spouse/significant other Available Help at Discharge: Available PRN/intermittently;Family Type of Home: House Home Access: Stairs to enter Entrance Stairs-Rails: None Entrance Stairs-Number of Steps: 2 Home Layout: Two level;Full bath on main level;Able to live on main level with bedroom/bathroom Home Equipment: None      Prior Function Level of Independence: Independent         Comments: did not drive     Hand Dominance        Extremity/Trunk Assessment   Upper Extremity Assessment: Overall WFL for tasks assessed           Lower Extremity Assessment: RLE deficits/detail RLE Deficits / Details: AAROM self limited knee flexion to about 75, hip flexion to about 70 due to soreness/pain, strength hip flexion 2+/5       Communication   Communication: No difficulties  Cognition Arousal/Alertness: Awake/alert Behavior During Therapy: WFL for tasks assessed/performed Overall Cognitive Status: Within Functional Limits for tasks assessed                      General Comments  Exercises General Exercises - Lower Extremity Ankle Circles/Pumps: AROM;10 reps;Supine;Both Heel Slides: AAROM;10 reps;Seated;Left      Assessment/Plan    PT Assessment Patient needs continued PT services  PT Diagnosis Acute pain;Difficulty walking   PT Problem List Pain;Decreased knowledge of use of DME;Decreased mobility;Decreased activity tolerance;Decreased balance;Decreased range of motion  PT Treatment Interventions DME instruction;Functional mobility  training;Patient/family education;Gait training;Stair training;Therapeutic exercise;Therapeutic activities   PT Goals (Current goals can be found in the Care Plan section) Acute Rehab PT Goals Patient Stated Goal: To return home PT Goal Formulation: With patient Time For Goal Achievement: 07/10/15 Potential to Achieve Goals: Good    Frequency Min 3X/week   Barriers to discharge        Co-evaluation               End of Session Equipment Utilized During Treatment: Gait belt Activity Tolerance: Patient limited by pain Patient left: in chair;with call bell/phone within reach           Time: 0920-0945 PT Time Calculation (min) (ACUTE ONLY): 25 min   Charges:   PT Evaluation $Initial PT Evaluation Tier I: 1 Procedure PT Treatments $Gait Training: 8-22 mins   PT G Codes:        WYNN,CYNDI 2015/07/06, 9:55 AM Magda Kiel, [place] 07/06/2015

## 2015-07-03 NOTE — Care Management Note (Addendum)
Case Management Note  Patient Details  Name: Juan Wheeler MRN: [id number] Date of Birth: Mar 21, 1961  Subjective/Objective:      From home with wife  admitted s/p Left femoral to above-knee popliteal bypass. Independent with ADL's.         Action/Plan: Return to home when medically stable.cm to f/u with disposition needs.  Expected Discharge Date:                  Expected Discharge Plan:  Home/Self Care  In-House Referral:     Discharge planning Services  CM Consult  Post Acute Care Choice:    Choice offered to:     DME Arranged:    DME Agency:     HH Arranged:    HH Agency:     Status of Service:  In process, will continue to follow  Medicare Important Message Given:    Date Medicare IM Given:    Medicare IM give by:    Date Additional Medicare IM Given:    Additional Medicare Important Message give by:     If discussed at [place] of Stay Meetings, dates discussed:    Additional Comments: Torrance Stockley (Spouse)  [phone]  Per recommendation from PT, pt will need Rolling walker with 5" wheels @ d/c. CM requested order from MD via sticky note.  Whitman Hero [place], [place] [phone] 07/03/2015, 9:54 AM

## 2015-07-03 NOTE — Progress Notes (Addendum)
VASCULAR LAB PRELIMINARY  ARTERIAL  ABI completed:    RIGHT    LEFT    PRESSURE WAVEFORM  PRESSURE WAVEFORM  BRACHIAL 134 Triphasic BRACHIAL 140 Triphasic  DP 117 Biphasic DP 96 Triphasic  PT 131 Triphasic PT 98 Bi[hasic    RIGHT LEFT  ABI 0.94 0.70   Right ABI indicate normal arterial flow post operative at rest. Left ABI indicates a moderate reduction in arterial flow post operative at rest.  Fredrich Cory, [place], RVS 07/03/2015, 2:14 PM

## 2015-07-04 MED ORDER — LOVASTATIN 10 MG PO TABS: 10.0000 mg | ORAL_TABLET | Freq: Every day | ORAL | Status: DC

## 2015-07-04 MED ORDER — OXYCODONE HCL 10 MG PO TABS: 10.0000 mg | ORAL_TABLET | ORAL | Status: DC | PRN

## 2015-07-04 NOTE — Progress Notes (Addendum)
  [facility] Progress Note  Subjective  - POD #2  Soreness with incisions. Wants to go home.   Objective Filed Vitals:   07/04/15 0503  BP: 122/74  Pulse: 89  Temp: 99.1 F (37.3 C)  Resp: 18    Intake/Output Summary (Last 24 hours) at 07/04/15 0737 Last data filed at 07/04/15 0500  Gross per 24 hour  Intake    360 ml  Output   1050 ml  Net   -690 ml    Left leg incisions clean and intact.  Left foot is warm and well perfused.   Assessment/Planning: 54 y.o. male is s/p: left femoral to above-knee popliteal bypass  2 Days Post-Op   Pain well controlled on po meds. Ambulating in halls.  Bypass patent.  Incisions are fine.  Discharge home today. Follow up in 2 weeks with Dr. Oneida Alar.  VQI: Is on ASA. Will prescribe statin.   Alvia Grove 07/04/2015 7:37 AM --  Palpable DP pulse Incisions healing  D/c home  Ruta Hinds, MD [facility] [place] Office: [phone] Pager: [phone]  Laboratory CBC    Component Value Date/Time   WBC 9.5 07/03/2015 0026   HGB 15.8 07/03/2015 0026   HCT 46.1 07/03/2015 0026   PLT 156 07/03/2015 0026    BMET    Component Value Date/Time   NA 136 07/03/2015 0026   K 4.8 07/03/2015 0026   CL 100* 07/03/2015 0026   CO2 27 07/03/2015 0026   GLUCOSE 151* 07/03/2015 0026   BUN 8 07/03/2015 0026   CREATININE 1.38* 07/03/2015 0026   CREATININE 1.10 04/14/2013 1617   CALCIUM 8.3* 07/03/2015 0026   GFRNONAA 56* 07/03/2015 0026   GFRAA >60 07/03/2015 0026    COAG Lab Results  Component Value Date   INR 1.00 06/24/2015   INR 0.90 04/14/2013   No results found for: PTT  Antibiotics Anti-infectives    Start     Dose/Rate Route Frequency Ordered Stop   07/02/15 1900  cefUROXime (ZINACEF) 1.5 g in dextrose 5 % 50 mL IVPB     1.5 g 100 mL/hr over 30 Minutes Intravenous Every 12 hours 07/02/15 1740 07/03/15 0618   07/02/15 0700  cefUROXime (ZINACEF) 1.5 g in dextrose 5  % 50 mL IVPB     1.5 g 100 mL/hr over 30 Minutes Intravenous To ShortStay Surgical 07/01/15 1316 07/02/15 0730       Virgina Jock, [name] [facility] Office: [phone] Pager: [phone] 07/04/2015 7:37 AM

## 2015-07-04 NOTE — Progress Notes (Signed)
Patient ambulated about 50 ft on RA using a RW, patient stated he felt weak and would like to return back to the room. RN assisted pt. Back into bed, bed alarm on, call bell within reach, VSS. RN will continue to monitor.  Sharene Skeans, RN

## 2015-07-04 NOTE — Care Management Note (Signed)
Case Management Note CM note started by Whitman Hero RNCM  Patient Details  Name: Juan Wheeler MRN: [id number] Date of Birth: [birth year]  Subjective/Objective:      From home with wife  admitted s/p Left femoral to above-knee popliteal bypass. Independent with ADL's.         Action/Plan: Return to home when medically stable.cm to f/u with disposition needs.  Expected Discharge Date:      07/04/15            Expected Discharge Plan:  Home/Self Care  In-House Referral:     Discharge planning Services  CM Consult  Post Acute Care Choice:    Choice offered to:  Patient  DME Arranged:  Gilford Rile rolling (Referral made with [facility]([phone]) DME Agency:  [place]:    [facility] [place] Agency:     Status of Service:  Completed, signed off  Medicare Important Message Given:  N/A - LOS <3 / Initial given by admissions Date Medicare IM Given:    Medicare IM give by:    Date Additional Medicare IM Given:    Additional Medicare Important Message give by:     If discussed at [place] of Stay Meetings, dates discussed:    Additional Comments: Tad Fancher (Spouse)  [phone]  07/04/15- Marvetta Gibbons RN, BSN - pt for d/c home today, order placed for DME- RW- call placed to [facility] with [facility] for DME needs- RW to be delivered to room prior to discharge.   Per recommendation from PT, pt will need Rolling walker with 5" wheels @ d/c. CM requested order from MD via sticky note.  Marvetta Gibbons [place], [place] [phone] 07/04/2015, 11:02 AM

## 2015-07-04 NOTE — Progress Notes (Signed)
Reviewed d/c with patient and family. Iv removed. Patient escorted out via wheelchair.  Brees Hounshell, Mervin Kung RN

## 2015-07-04 NOTE — Care Management Important Message (Signed)
Important Message  Patient Details  Name: Mithran Strike MRN: [id number] Date of Birth: 08-02-1961   Medicare Important Message Given:  N/A - LOS <3 / Initial given by admissions    Dawayne Patricia, RN 07/04/2015, 11:02 AM

## 2015-07-05 ENCOUNTER — Telehealth: Payer: Self-pay | Admitting: Vascular Surgery

## 2015-07-05 NOTE — Discharge Summary (Signed)
[facility] Discharge Summary  Juan Wheeler December 24, 1960 54 y.o. male  [id number]  Admission Date: 07/02/2015  Discharge Date: 07/04/2015  Physician: Ruta Hinds, MD  Admission Diagnosis: Peripheral vascular disease with left lower extremity claudication I70.212  HPI:   This is a 54 y.o. male who presented for evaluation for complaints of left leg calf pain with walking that has worsened over the past year. He states that a year ago, he could walk ~ 0.25 miles and now he can only walk a city block without having to stop and rest. At that time, his pain improves and he continues to walk. He does have burning in both feet at night if the covers are too tight on his feet. He does not have any pain in the right leg. He returns today for follow up after evaluation 5 months ago. Symptoms remain unchanged.  He states that he was evaluated at [facility] for this and could not get stents in, however, he does not recall having an arteriogram.  He states that he continues to smoke a little less than a pack of cigarettes per day. He expresses that he would like to quit and has thought about it. He has smoked for about 40 years. His wife continues to smoke a pack of cigarettes, which will last her about 3-4 days.   He does have a hx of drug eluting coronary artery stenting in 2014 by Dr. Martinique.  He does have a hx of rectal cancer that was diagnosed in 2012 and did have radiation, chemotherapy and surgery. He continues to have chronic pain with this and is on disability. He states that for the most part, he does have normal bowel movements, but does need occasional stool softeners.  He did have an exploratory laparotomy ~ 25-30 years ago for ulcer disease.  He is on a daily aspirin. He does take an ACEI for his HTN. He is on a statin for his cholesterol.  Hospital Course:  The patient was admitted to the hospital and taken to the operating room on 07/02/2015  and underwent: left femoral to above-knee popliteal bypass    The patient tolerated the procedure well and was transported to the PACU in stable condition.   POD 1: The patient had some soreness with his left leg. His incisions were clean. He had a palpable left DP pulse. He was transferred to the telemetry floor.   POD 2:  His pain was well controlled and ambulating well in the halls. His incisions were clean and intact and bypass patent. He was discharged home on POD 2 in good condition.   CBC    Component Value Date/Time   WBC 9.5 07/03/2015 0026   RBC 4.77 07/03/2015 0026   HGB 15.8 07/03/2015 0026   HCT 46.1 07/03/2015 0026   PLT 156 07/03/2015 0026   MCV 96.6 07/03/2015 0026   MCH 33.1 07/03/2015 0026   MCHC 34.3 07/03/2015 0026   RDW 14.0 07/03/2015 0026   LYMPHSABS 1.8 03/27/2015 1022   MONOABS 0.5 03/27/2015 1022   EOSABS 0.2 03/27/2015 1022   BASOSABS 0.0 03/27/2015 1022    BMET    Component Value Date/Time   NA 136 07/03/2015 0026   K 4.8 07/03/2015 0026   CL 100* 07/03/2015 0026   CO2 27 07/03/2015 0026   GLUCOSE 151* 07/03/2015 0026   BUN 8 07/03/2015 0026   CREATININE 1.38* 07/03/2015 0026   CREATININE 1.10 04/14/2013 1617   CALCIUM 8.3* 07/03/2015 0026  GFRNONAA 56* 07/03/2015 0026   GFRAA >60 07/03/2015 0026     Discharge Instructions:   The patient is discharged to home with extensive instructions on wound care and progressive ambulation.  They are instructed not to drive or perform any heavy lifting until returning to see the physician in his office.  Discharge Instructions    Call MD for:  redness, tenderness, or signs of infection (pain, swelling, bleeding, redness, odor or green/yellow discharge around incision site)    Complete by:  As directed      Call MD for:  severe or increased pain, loss or decreased feeling  in affected limb(s)    Complete by:  As directed      Call MD for:  temperature >100.5    Complete by:  As directed       Discharge wound care:    Complete by:  As directed   Shower daily. Wash the groin wound and leg incisions with soap and water daily and pat dry. (No tub bath-only shower)  Then put a dry gauze or washcloth in groin to keep this area dry daily and as needed.  Do not use Vaseline or neosporin on your incisions.  Only use soap and water on your incisions and then protect and keep dry.     Driving Restrictions    Complete by:  As directed   No driving for 2 weeks     Increase activity slowly    Complete by:  As directed   Walk with assistance use walker or cane as needed     Lifting restrictions    Complete by:  As directed   No lifting for 2 weeks     Resume previous diet    Complete by:  As directed            Discharge Diagnosis:  Peripheral vascular disease with left lower extremity claudication I70.212  Secondary Diagnosis: Patient Active Problem List   Diagnosis Date Noted  . PAD (peripheral artery disease) ([place]) 07/02/2015  . Preoperative cardiovascular examination 06/12/2015  . Cardiomyopathy, ischemic 06/12/2015  . Pulmonary nodules 09/30/2014  . ASCVD (arteriosclerotic cardiovascular disease) 05/02/2013  . Unstable angina ([place]) 04/18/2013  . Chest pain 03/29/2013  . Tobacco abuse 03/29/2013  . Chronic pain syndrome 11/09/2012  . Pain in joint, pelvic region and thigh 11/09/2012  . Neuralgia and neuritis 10/12/2012  . Atherosclerosis of native arteries of extremity with intermittent claudication ([place]) 08/19/2012  . Backache 03/24/2012  . Peripheral vascular disease ([place]) 12/04/2011  . Compression of vein 12/04/2011  . Heartburn 12/03/2011  . Personal history of digestive disease 11/19/2011  . Depressive disorder 09/24/2011  . Dysuria 08/31/2011  . Impotence of organic origin 08/31/2011  . Urinary frequency 08/31/2011  . Constipation 06/05/2011  . Essential hypertension 06/05/2011  . Anal or rectal pain 06/05/2011  . Malignant neoplasm of rectum ([place]) 12/02/2010    Past Medical History  Diagnosis Date  . Hypertension   . DVT (deep venous thrombosis) ([place]) ~ 2013  . Rectal cancer ([place])   . Coronary artery disease     a. 03/2013: abnl nuc -> LHC s/p DES to LCx, residual moderate disease in LAD (med rx unless refractory angina). b. Not on BB due to bradycardia.  Marland Kitchen History of blood transfusion     "once; after throwing up alot of blood" (04/17/2013)  . GERD (gastroesophageal reflux disease)   . Chronic lower back pain     a. Followed [facility] management at  [facility].  . PAD (peripheral artery disease) ([place])     a. Occlusion of the right internal iliac artery, with significant atherosclerosis in the left internal iliac which was not amenable to reconstruction per notes from [facility].  . LV dysfunction     a. EF 45% in 03/2013.  . Tobacco abuse   . Colon cancer ([place])     rectal cancer  . Pulmonary nodules 09/30/2014       Medication List    TAKE these medications        amLODipine 5 MG tablet  Commonly known as:  NORVASC  TAKE 1 TABLET (5 MG TOTAL) BY MOUTH DAILY.     aspirin EC 81 MG tablet  Take 81 mg by mouth daily.     isosorbide mononitrate 30 MG 24 hr tablet  Commonly known as:  IMDUR  Take 0.5 tablets (15 mg total) by mouth daily.     lisinopril 40 MG tablet  Commonly known as:  PRINIVIL,ZESTRIL  Take 40 mg by mouth daily.     lovastatin 10 MG tablet  Commonly known as:  MEVACOR  Take 1 tablet (10 mg total) by mouth at bedtime.     morphine 60 MG 12 hr tablet  Commonly known as:  MS CONTIN  Take 1 tablet (60 mg total) by mouth every 8 (eight) hours. Take 1 tablet every 8 hours.     nitroGLYCERIN 0.4 MG SL tablet  Commonly known as:  NITROSTAT  PLACE 1 TABLET UNDER TONGUE IF NEEDED FOR CHEST PAIN..UP TO 3 DOSES     omeprazole 40 MG capsule  Commonly known as:  PRILOSEC  Take 40 mg by mouth daily.     Oxycodone HCl 10 MG Tabs  Take 1-2 tablets (10-20 mg total) by mouth every 4 (four) hours as needed. Take 1 or 2  tablets every 4 hours as needed for pain..        Oxycodone #30 No Refill  Disposition: Home  Patient's condition: is Good  Follow up: 1. Dr. Oneida Alar in 2 weeks   Virgina Jock, [name] [facility] [phone] 07/05/2015  9:10 AM  - For VQI Registry use --- Instructions: Press F2 to tab through selections.  Delete question if not applicable.   Post-op:  Wound infection: No  Graft infection: No  Transfusion: No   New Arrhythmia: No Ipsilateral amputation: No, '[ ]'$  Minor, '[ ]'$  BKA, '[ ]'$  AKA Discharge patency: [x ] Primary, '[ ]'$  Primary assisted, '[ ]'$  Secondary, '[ ]'$  Occluded Patency judged by: '[ ]'$  Dopper only, '[ ]'$  Palpable graft pulse, [x ] Palpable distal pulse, '[ ]'$  ABI inc. > 0.15, '[ ]'$  Duplex Discharge ABI: R 0.94, L 0.70 D/C Ambulatory Status: Ambulatory with Assistance  Complications: MI: No, '[ ]'$  Troponin only, '[ ]'$  EKG or Clinical CHF: No Resp failure:No, '[ ]'$  Pneumonia, '[ ]'$  Ventilator Chg in renal function: No, '[ ]'$  Inc. Cr > 0.5, '[ ]'$  Temp. Dialysis, '[ ]'$  Permanent dialysis Stroke: No, '[ ]'$  Minor, '[ ]'$  Major Return to OR: No  Reason for return to OR: '[ ]'$  Bleeding, '[ ]'$  Infection, '[ ]'$  Thrombosis, '[ ]'$  Revision  Discharge medications: Statin use:  yes ASA use:  yes Plavix use:  no Beta blocker use: no Coumadin use: no

## 2015-07-05 NOTE — Telephone Encounter (Addendum)
-----   Message from Mena Goes, RN sent at 07/04/2015  9:16 AM EDT ----- Regarding: schedule   ----- Message -----    From: Alvia Grove, [name]    Sent: 07/04/2015   7:46 AM      To: Vvs Charge Pool  S/p left femoral to above-knee popliteal bypass 07/02/15  F/u with [facility] in 2 weeks  Thanks Maudie Mercury  notified patient of post op appt. on 07-17-15 at 12:30 with dr. Oneida Alar

## 2015-07-16 ENCOUNTER — Encounter: Payer: Self-pay | Admitting: Vascular Surgery

## 2015-07-17 ENCOUNTER — Encounter: Payer: Self-pay | Admitting: Vascular Surgery

## 2015-07-17 ENCOUNTER — Ambulatory Visit (INDEPENDENT_AMBULATORY_CARE_PROVIDER_SITE_OTHER): Payer: Medicare Other | Admitting: Vascular Surgery

## 2015-07-17 VITALS — Temp 97.6°F | Ht 69.0 in | Wt 170.0 lb

## 2015-07-17 DIAGNOSIS — I739 Peripheral vascular disease, unspecified: Secondary | ICD-10-CM

## 2015-07-17 NOTE — Progress Notes (Signed)
Patient is a 54 year old male returns for postoperative follow-up today after recent left femoral to above-knee popliteal bypass with vein on October 4. He has had some breakdown of his groin and saphenectomy incisions. He is reporting some drainage from one of the medial saphenectomy incisions mid thigh. He denies any fever or chills. His claudication symptoms have improved.  Physical exam:  Filed Vitals:   07/17/15 1220  Temp: 97.6 F (36.4 C)  TempSrc: Oral  Height: '5\' 9"'$  (1.753 m)  Weight: 170 lb (77.111 kg)  SpO2: 97%    Extremities: 2+ dorsalis pedis and posterior tibial pulse left foot, area of skin necrosis left medial thigh from saphenectomy site overall wound clean no surrounding erythema or purulent drainage some maceration of the inferior aspect of the left groin incision and superior aspect of the skip incision for the saphenectomy just below this.  Assessment: Patent bypass with improvement of claudication symptoms but with some wound healing problems left groin mid thigh.  Plan: Follow-up 2 weeks dry dressings soap and water daily for incisions. The patient will need follow-up ABIs in a graft duplex scan 3 months.  Ruta Hinds, MD [facility] [place] Office: [phone] Pager: [phone]

## 2015-07-18 ENCOUNTER — Encounter (HOSPITAL_COMMUNITY): Payer: Medicare Other

## 2015-07-18 ENCOUNTER — Ambulatory Visit: Payer: Medicare Other | Admitting: Vascular Surgery

## 2015-07-29 ENCOUNTER — Ambulatory Visit (INDEPENDENT_AMBULATORY_CARE_PROVIDER_SITE_OTHER): Payer: Commercial Managed Care - HMO | Admitting: Family

## 2015-07-29 ENCOUNTER — Telehealth: Payer: Self-pay

## 2015-07-29 ENCOUNTER — Encounter: Payer: Self-pay | Admitting: Family

## 2015-07-29 VITALS — BP 120/74 | HR 118 | Temp 98.5°F | Resp 18 | Ht 72.0 in | Wt 170.0 lb

## 2015-07-29 DIAGNOSIS — G894 Chronic pain syndrome: Secondary | ICD-10-CM

## 2015-07-29 DIAGNOSIS — Z95828 Presence of other vascular implants and grafts: Secondary | ICD-10-CM

## 2015-07-29 DIAGNOSIS — T8189XD Other complications of procedures, not elsewhere classified, subsequent encounter: Secondary | ICD-10-CM

## 2015-07-29 DIAGNOSIS — C20 Malignant neoplasm of rectum: Secondary | ICD-10-CM

## 2015-07-29 DIAGNOSIS — I739 Peripheral vascular disease, unspecified: Secondary | ICD-10-CM

## 2015-07-29 MED ORDER — OXYCODONE HCL 10 MG PO TABS: 10.0000 mg | ORAL_TABLET | ORAL | Status: DC | PRN

## 2015-07-29 MED ORDER — CEPHALEXIN 500 MG PO CAPS: 500.0000 mg | ORAL_CAPSULE | Freq: Four times a day (QID) | ORAL | Status: DC

## 2015-07-29 NOTE — Telephone Encounter (Signed)
Pt. called and reported increased drianage and strong odor from lower left leg incision near the knee.  Reported he noticed the odor about 3 days ago.  Denied fever/ chills.  Denied any increased erythema in peri-wound.  Reported he has been covering the incision at night, and leaving it open to air during the day.  Reported the incision is more open than when seen in office on 10/19.  Appt. Given today at 11:30 AM with NP.  Verb. Understanding.

## 2015-07-29 NOTE — Patient Instructions (Addendum)

## 2015-07-29 NOTE — Progress Notes (Signed)
Postoperative Visit   History of Present Illness  Juan Wheeler is a 54 y.o. year old male patient of Dr. Oneida Alar who is s/p left femoral to above-knee popliteal bypass with vein on July 02, 2015.  He last saw Dr. Oneida Alar on 07/17/15. At that time the pt had had some breakdown of his groin and saphenectomy incisions. He was reporting some drainage from one of the medial saphenectomy incisions mid thigh. He had a patent bypass with improvement of claudication symptoms but with some wound healing problems left groin mid thigh. The plan was to follow-up in 2 weeks, dry dressings with soap and water daily for incisions, then follow-up ABIs and a graft duplex scan 3 months.  He returns today with c/o increased drainage and odor from his left leg incision for 3 days.  He has been cleansing the incision daily with Keri Cleanse and dressing with gauze, holding dressing in place with Kerlex and and ace wrap. He is not taking any antibiotics. He reports that he used his last oxycodone tablet today, that this was effective in controlling his pain in the left leg which is 9/10 at times. He reports that he also takes the same oxycodone, and also morphine, for rectal pain related to his rectal cancer, that he has a few morphine left, that he cannot get any more oxycodone for his rectal pain until 08/07/15.  The patient is able to complete his activities of daily living.    He does not have DM but does admit to continued smoking, about 5 cigarettes/day. I advised him that smoking impedes wound healing.  For VQI Use Only  PRE-ADM LIVING: Home  AMB STATUS: Ambulatory with walker  Physical Examination  Filed Vitals:   07/29/15 1127  BP: 120/74  Pulse: 118  Temp: 98.5 F (36.9 C)  TempSrc: Oral  Resp: 18  Height: 6' (1.829 m)  Weight: 170 lb (77.111 kg)  SpO2: 98%   Body mass index is 23.05 kg/(m^2).  Bilateral radial, DP, and femoral pulses are palpable Left groin incision is healing  well. Left medial/distal thigh incision has eschar that was debrided by Dr. Trula Slade. Dr. Trula Slade also clipped a few sutures that were superficial to the depth of the opening which is about 1 cm deep, 3 cm at the widest opening, and 8 cm in length. The wound is draining a large amount of watery blood tinged drainage that is slightly malodorous, no purulence. The incision has an opening  Medical Decision Making  Juan Wheeler is a 54 y.o. year old male who presents s/p left femoral to above-knee popliteal bypass with vein on July 02, 2015. . The patient was counseled re smoking cessation and given several free resources re smoking cessation.  Oxycodone 10 mg, 1 tab po every 4 hours prn pain, disp #30, 0 refills. Keflex 500 mg qid x 10 days, disp #40, 0 refills. Follow up with Dr. Oneida Alar as scheduled in 3 days.  Home Health for wet to dry dressing changes twice daily. The opening in his incision needs to be encouraged to granulate with wet to dry dressings bid. I discussed in depth with the patient the nature of atherosclerosis, and emphasized the importance of maximal medical management including strict control of blood pressure, blood glucose, and lipid levels, obtaining regular exercise, and cessation of smoking.  The patient is aware that without maximal medical management the underlying atherosclerotic disease process will progress, limiting the benefit of any interventions.    NICKEL, SUZANNE  L, RN, MSN, FNP-C [facility] [place] Office: [phone]  07/29/2015, 11:26 AM  Clinic MD: Trula Slade

## 2015-07-30 ENCOUNTER — Encounter: Payer: Self-pay | Admitting: Vascular Surgery

## 2015-07-30 ENCOUNTER — Telehealth: Payer: Self-pay | Admitting: *Deleted

## 2015-07-30 NOTE — Telephone Encounter (Signed)
-----   Message from Emery, NP sent at 07/29/2015 12:12 PM EDT ----- Regarding: HH dressing changes bid Katy Apo, Please arrange for [employer] dressing changes twice daily, wet to dry NS, To left leg open part of medial incision. Thank you, Vinnie Level

## 2015-07-31 DIAGNOSIS — C20 Malignant neoplasm of rectum: Secondary | ICD-10-CM | POA: Diagnosis not present

## 2015-07-31 DIAGNOSIS — I10 Essential (primary) hypertension: Secondary | ICD-10-CM | POA: Diagnosis not present

## 2015-07-31 DIAGNOSIS — F17210 Nicotine dependence, cigarettes, uncomplicated: Secondary | ICD-10-CM | POA: Diagnosis not present

## 2015-07-31 DIAGNOSIS — Z95828 Presence of other vascular implants and grafts: Secondary | ICD-10-CM | POA: Diagnosis not present

## 2015-07-31 DIAGNOSIS — R2689 Other abnormalities of gait and mobility: Secondary | ICD-10-CM | POA: Diagnosis not present

## 2015-07-31 DIAGNOSIS — I739 Peripheral vascular disease, unspecified: Secondary | ICD-10-CM | POA: Diagnosis not present

## 2015-07-31 DIAGNOSIS — G894 Chronic pain syndrome: Secondary | ICD-10-CM | POA: Diagnosis not present

## 2015-07-31 DIAGNOSIS — T8189XD Other complications of procedures, not elsewhere classified, subsequent encounter: Secondary | ICD-10-CM | POA: Diagnosis not present

## 2015-08-01 ENCOUNTER — Encounter: Payer: Self-pay | Admitting: Vascular Surgery

## 2015-08-01 ENCOUNTER — Ambulatory Visit (INDEPENDENT_AMBULATORY_CARE_PROVIDER_SITE_OTHER): Payer: Commercial Managed Care - HMO | Admitting: Vascular Surgery

## 2015-08-01 VITALS — BP 119/90 | HR 107 | Temp 98.2°F | Ht 72.0 in | Wt 167.0 lb

## 2015-08-01 DIAGNOSIS — I739 Peripheral vascular disease, unspecified: Secondary | ICD-10-CM

## 2015-08-01 NOTE — Progress Notes (Signed)
Patient is a 54 year old male who returns today for further follow-up after left femoral to above-knee popliteal bypass for claudication on October 4. He has an open wound at his above-knee incision which she has been packing daily. He has also developed a contracture of his left knee.    physical exam:  Filed Vitals:   08/01/15 0839  BP: 119/90  Pulse: 107  Temp: 98.2 F (36.8 C)  TempSrc: Oral  Height: 6' (1.829 m)  Weight: 167 lb (75.751 kg)  SpO2: 96%     Left lower extremity: 7 x 3 cm open wound just above the knee medial portion of the thigh healthy-appearing BP granulation tissue no. Drainage no surrounding erythema 2+ dorsalis pedis and posterior tibial pulse left knee contracted approximately 15   Assessment: Slowly healing left above-knee popliteal incision with patent bypass graft. Now with 15 left knee contracture.  Plan: Continue local wound care. Patient has follow-up scheduled with physical therapy to work on the contracture in his left knee. Emphasized to him straight in this leg out and exercises to work on to improve this.   The patient will follow-up with me in a few weeks to recheck the wound in his leg to make sure the contracture is improving.  Ruta Hinds, MD [facility] [place] Office: [phone] Pager: [phone]

## 2015-08-02 DIAGNOSIS — Z95828 Presence of other vascular implants and grafts: Secondary | ICD-10-CM | POA: Diagnosis not present

## 2015-08-02 DIAGNOSIS — G894 Chronic pain syndrome: Secondary | ICD-10-CM | POA: Diagnosis not present

## 2015-08-02 DIAGNOSIS — T8189XD Other complications of procedures, not elsewhere classified, subsequent encounter: Secondary | ICD-10-CM | POA: Diagnosis not present

## 2015-08-02 DIAGNOSIS — I739 Peripheral vascular disease, unspecified: Secondary | ICD-10-CM | POA: Diagnosis not present

## 2015-08-02 DIAGNOSIS — F17210 Nicotine dependence, cigarettes, uncomplicated: Secondary | ICD-10-CM | POA: Diagnosis not present

## 2015-08-02 DIAGNOSIS — C20 Malignant neoplasm of rectum: Secondary | ICD-10-CM | POA: Diagnosis not present

## 2015-08-02 DIAGNOSIS — R2689 Other abnormalities of gait and mobility: Secondary | ICD-10-CM | POA: Diagnosis not present

## 2015-08-02 DIAGNOSIS — I10 Essential (primary) hypertension: Secondary | ICD-10-CM | POA: Diagnosis not present

## 2015-08-05 DIAGNOSIS — F17210 Nicotine dependence, cigarettes, uncomplicated: Secondary | ICD-10-CM | POA: Diagnosis not present

## 2015-08-05 DIAGNOSIS — T8189XD Other complications of procedures, not elsewhere classified, subsequent encounter: Secondary | ICD-10-CM | POA: Diagnosis not present

## 2015-08-05 DIAGNOSIS — R2689 Other abnormalities of gait and mobility: Secondary | ICD-10-CM | POA: Diagnosis not present

## 2015-08-05 DIAGNOSIS — I739 Peripheral vascular disease, unspecified: Secondary | ICD-10-CM | POA: Diagnosis not present

## 2015-08-05 DIAGNOSIS — I10 Essential (primary) hypertension: Secondary | ICD-10-CM | POA: Diagnosis not present

## 2015-08-05 DIAGNOSIS — Z95828 Presence of other vascular implants and grafts: Secondary | ICD-10-CM | POA: Diagnosis not present

## 2015-08-05 DIAGNOSIS — C20 Malignant neoplasm of rectum: Secondary | ICD-10-CM | POA: Diagnosis not present

## 2015-08-05 DIAGNOSIS — G894 Chronic pain syndrome: Secondary | ICD-10-CM | POA: Diagnosis not present

## 2015-08-06 DIAGNOSIS — I739 Peripheral vascular disease, unspecified: Secondary | ICD-10-CM | POA: Diagnosis not present

## 2015-08-06 DIAGNOSIS — I10 Essential (primary) hypertension: Secondary | ICD-10-CM | POA: Diagnosis not present

## 2015-08-06 DIAGNOSIS — Z95828 Presence of other vascular implants and grafts: Secondary | ICD-10-CM | POA: Diagnosis not present

## 2015-08-06 DIAGNOSIS — C20 Malignant neoplasm of rectum: Secondary | ICD-10-CM | POA: Diagnosis not present

## 2015-08-06 DIAGNOSIS — T8189XD Other complications of procedures, not elsewhere classified, subsequent encounter: Secondary | ICD-10-CM | POA: Diagnosis not present

## 2015-08-06 DIAGNOSIS — R2689 Other abnormalities of gait and mobility: Secondary | ICD-10-CM | POA: Diagnosis not present

## 2015-08-06 DIAGNOSIS — G894 Chronic pain syndrome: Secondary | ICD-10-CM | POA: Diagnosis not present

## 2015-08-06 DIAGNOSIS — F17210 Nicotine dependence, cigarettes, uncomplicated: Secondary | ICD-10-CM | POA: Diagnosis not present

## 2015-08-08 DIAGNOSIS — F17210 Nicotine dependence, cigarettes, uncomplicated: Secondary | ICD-10-CM | POA: Diagnosis not present

## 2015-08-08 DIAGNOSIS — I10 Essential (primary) hypertension: Secondary | ICD-10-CM | POA: Diagnosis not present

## 2015-08-08 DIAGNOSIS — T8189XD Other complications of procedures, not elsewhere classified, subsequent encounter: Secondary | ICD-10-CM | POA: Diagnosis not present

## 2015-08-08 DIAGNOSIS — I739 Peripheral vascular disease, unspecified: Secondary | ICD-10-CM | POA: Diagnosis not present

## 2015-08-08 DIAGNOSIS — C20 Malignant neoplasm of rectum: Secondary | ICD-10-CM | POA: Diagnosis not present

## 2015-08-08 DIAGNOSIS — R2689 Other abnormalities of gait and mobility: Secondary | ICD-10-CM | POA: Diagnosis not present

## 2015-08-08 DIAGNOSIS — Z95828 Presence of other vascular implants and grafts: Secondary | ICD-10-CM | POA: Diagnosis not present

## 2015-08-08 DIAGNOSIS — G894 Chronic pain syndrome: Secondary | ICD-10-CM | POA: Diagnosis not present

## 2015-08-12 DIAGNOSIS — Z95828 Presence of other vascular implants and grafts: Secondary | ICD-10-CM | POA: Diagnosis not present

## 2015-08-12 DIAGNOSIS — R2689 Other abnormalities of gait and mobility: Secondary | ICD-10-CM | POA: Diagnosis not present

## 2015-08-12 DIAGNOSIS — F17210 Nicotine dependence, cigarettes, uncomplicated: Secondary | ICD-10-CM | POA: Diagnosis not present

## 2015-08-12 DIAGNOSIS — C20 Malignant neoplasm of rectum: Secondary | ICD-10-CM | POA: Diagnosis not present

## 2015-08-12 DIAGNOSIS — T8189XD Other complications of procedures, not elsewhere classified, subsequent encounter: Secondary | ICD-10-CM | POA: Diagnosis not present

## 2015-08-12 DIAGNOSIS — I739 Peripheral vascular disease, unspecified: Secondary | ICD-10-CM | POA: Diagnosis not present

## 2015-08-12 DIAGNOSIS — I10 Essential (primary) hypertension: Secondary | ICD-10-CM | POA: Diagnosis not present

## 2015-08-12 DIAGNOSIS — G894 Chronic pain syndrome: Secondary | ICD-10-CM | POA: Diagnosis not present

## 2015-08-13 ENCOUNTER — Encounter: Payer: Self-pay | Admitting: Vascular Surgery

## 2015-08-14 DIAGNOSIS — Z95828 Presence of other vascular implants and grafts: Secondary | ICD-10-CM | POA: Diagnosis not present

## 2015-08-14 DIAGNOSIS — R2689 Other abnormalities of gait and mobility: Secondary | ICD-10-CM | POA: Diagnosis not present

## 2015-08-14 DIAGNOSIS — G894 Chronic pain syndrome: Secondary | ICD-10-CM | POA: Diagnosis not present

## 2015-08-14 DIAGNOSIS — I739 Peripheral vascular disease, unspecified: Secondary | ICD-10-CM | POA: Diagnosis not present

## 2015-08-14 DIAGNOSIS — F17210 Nicotine dependence, cigarettes, uncomplicated: Secondary | ICD-10-CM | POA: Diagnosis not present

## 2015-08-14 DIAGNOSIS — T8189XD Other complications of procedures, not elsewhere classified, subsequent encounter: Secondary | ICD-10-CM | POA: Diagnosis not present

## 2015-08-14 DIAGNOSIS — C20 Malignant neoplasm of rectum: Secondary | ICD-10-CM | POA: Diagnosis not present

## 2015-08-14 DIAGNOSIS — I10 Essential (primary) hypertension: Secondary | ICD-10-CM | POA: Diagnosis not present

## 2015-08-15 ENCOUNTER — Ambulatory Visit (INDEPENDENT_AMBULATORY_CARE_PROVIDER_SITE_OTHER): Payer: Commercial Managed Care - HMO | Admitting: Vascular Surgery

## 2015-08-15 ENCOUNTER — Encounter: Payer: Self-pay | Admitting: Vascular Surgery

## 2015-08-15 VITALS — BP 128/83 | HR 100 | Temp 97.9°F | Ht 72.0 in | Wt 173.0 lb

## 2015-08-15 DIAGNOSIS — I739 Peripheral vascular disease, unspecified: Secondary | ICD-10-CM

## 2015-08-15 NOTE — Progress Notes (Signed)
Patient is a 55 year old male who returns today for further follow-up after left femoral to above-knee popliteal bypass for claudication on October 4. He has an open wound at his above-knee incision which she has been packing daily. His knee contracture has now resolved.   Physical exam:     Filed Vitals:   08/15/15 0820  BP: 128/83  Pulse: 100  Temp: 97.9 F (36.6 C)  TempSrc: Oral  Height: 6' (1.829 m)  Weight: 173 lb (78.472 kg)  SpO2: 100%    Left lower extremity: 5 x 4 cm open wound just above the knee medial portion of the thigh healthy-appearing BP granulation tissue no. Drainage no surrounding erythema 2+ dorsalis pedis and posterior tibial pulse   Assessment: Slowly healing left above-knee popliteal incision with patent bypass graft.   Plan: Continue local wound care. Follow-up one month  Ruta Hinds, MD [facility] [place] Office: [phone] Pager: [phone]

## 2015-08-16 DIAGNOSIS — I739 Peripheral vascular disease, unspecified: Secondary | ICD-10-CM | POA: Diagnosis not present

## 2015-08-16 DIAGNOSIS — Z95828 Presence of other vascular implants and grafts: Secondary | ICD-10-CM | POA: Diagnosis not present

## 2015-08-16 DIAGNOSIS — I10 Essential (primary) hypertension: Secondary | ICD-10-CM | POA: Diagnosis not present

## 2015-08-16 DIAGNOSIS — T8189XD Other complications of procedures, not elsewhere classified, subsequent encounter: Secondary | ICD-10-CM | POA: Diagnosis not present

## 2015-08-16 DIAGNOSIS — G894 Chronic pain syndrome: Secondary | ICD-10-CM | POA: Diagnosis not present

## 2015-08-16 DIAGNOSIS — R2689 Other abnormalities of gait and mobility: Secondary | ICD-10-CM | POA: Diagnosis not present

## 2015-08-16 DIAGNOSIS — C20 Malignant neoplasm of rectum: Secondary | ICD-10-CM | POA: Diagnosis not present

## 2015-08-16 DIAGNOSIS — F17210 Nicotine dependence, cigarettes, uncomplicated: Secondary | ICD-10-CM | POA: Diagnosis not present

## 2015-08-19 DIAGNOSIS — R2689 Other abnormalities of gait and mobility: Secondary | ICD-10-CM | POA: Diagnosis not present

## 2015-08-19 DIAGNOSIS — T8189XD Other complications of procedures, not elsewhere classified, subsequent encounter: Secondary | ICD-10-CM | POA: Diagnosis not present

## 2015-08-19 DIAGNOSIS — F17210 Nicotine dependence, cigarettes, uncomplicated: Secondary | ICD-10-CM | POA: Diagnosis not present

## 2015-08-19 DIAGNOSIS — Z95828 Presence of other vascular implants and grafts: Secondary | ICD-10-CM | POA: Diagnosis not present

## 2015-08-19 DIAGNOSIS — I10 Essential (primary) hypertension: Secondary | ICD-10-CM | POA: Diagnosis not present

## 2015-08-19 DIAGNOSIS — C20 Malignant neoplasm of rectum: Secondary | ICD-10-CM | POA: Diagnosis not present

## 2015-08-19 DIAGNOSIS — G894 Chronic pain syndrome: Secondary | ICD-10-CM | POA: Diagnosis not present

## 2015-08-19 DIAGNOSIS — I739 Peripheral vascular disease, unspecified: Secondary | ICD-10-CM | POA: Diagnosis not present

## 2015-08-21 ENCOUNTER — Ambulatory Visit (INDEPENDENT_AMBULATORY_CARE_PROVIDER_SITE_OTHER): Payer: Commercial Managed Care - HMO | Admitting: Cardiovascular Disease

## 2015-08-21 ENCOUNTER — Encounter: Payer: Self-pay | Admitting: Cardiovascular Disease

## 2015-08-21 VITALS — BP 112/64 | HR 95 | Ht 69.0 in | Wt 172.2 lb

## 2015-08-21 DIAGNOSIS — Z720 Tobacco use: Secondary | ICD-10-CM | POA: Diagnosis not present

## 2015-08-21 DIAGNOSIS — I255 Ischemic cardiomyopathy: Secondary | ICD-10-CM

## 2015-08-21 DIAGNOSIS — R2689 Other abnormalities of gait and mobility: Secondary | ICD-10-CM | POA: Diagnosis not present

## 2015-08-21 DIAGNOSIS — F17210 Nicotine dependence, cigarettes, uncomplicated: Secondary | ICD-10-CM | POA: Diagnosis not present

## 2015-08-21 DIAGNOSIS — G894 Chronic pain syndrome: Secondary | ICD-10-CM | POA: Diagnosis not present

## 2015-08-21 DIAGNOSIS — E785 Hyperlipidemia, unspecified: Secondary | ICD-10-CM | POA: Diagnosis not present

## 2015-08-21 DIAGNOSIS — I2510 Atherosclerotic heart disease of native coronary artery without angina pectoris: Secondary | ICD-10-CM

## 2015-08-21 DIAGNOSIS — T8189XD Other complications of procedures, not elsewhere classified, subsequent encounter: Secondary | ICD-10-CM | POA: Diagnosis not present

## 2015-08-21 DIAGNOSIS — I739 Peripheral vascular disease, unspecified: Secondary | ICD-10-CM

## 2015-08-21 DIAGNOSIS — Z95828 Presence of other vascular implants and grafts: Secondary | ICD-10-CM | POA: Diagnosis not present

## 2015-08-21 DIAGNOSIS — I10 Essential (primary) hypertension: Secondary | ICD-10-CM | POA: Diagnosis not present

## 2015-08-21 DIAGNOSIS — C20 Malignant neoplasm of rectum: Secondary | ICD-10-CM | POA: Diagnosis not present

## 2015-08-21 NOTE — Patient Instructions (Signed)
Your physician wants you to follow-up in: 6 months with Dr Virgina Jock will receive a reminder letter in the mail two months in advance. If you don't receive a letter, please call our office to schedule the follow-up appointment.   Your physician recommends that you continue on your current medications as directed. Please refer to the Current Medication list given to you today.    If you need a refill on your cardiac medications before your next appointment, please call your pharmacy.    Your physician recommends that you return for lab work in: FASTING lipids  Now      Thank you for choosing [facility] !

## 2015-08-21 NOTE — Progress Notes (Signed)
Patient ID: Juan Wheeler, male   DOB: [birth year], 54 y.o.   MRN: [id number]      SUBJECTIVE: The patient is here for routine cardiovascular followup. He has a history of drug-eluting stent placement to the left circumflex coronary artery in July 2014 with moderate to severe obstructive disease in the distal LAD and mild disease in proximal LAD and right coronary artery. He also has hypertension, hyperlipidemia, rectal cancer, history of tobacco abuse, and peripheral vascular disease.  He underwent left femoral to above-the-knee popliteal bypass for claudication on 07/02/15.  Prior to doing so, he underwent a low risk nuclear stress test on 06/18/15 with no perfusion defects, calculated LVEF 54%.  Denies chest pain and shortness of breath. Trying to quit smoking but says it is very difficult.   Review of Systems: As per "subjective", otherwise negative.  Allergies  Allergen Reactions  . Darvocet [Propoxyphene N-Acetaminophen] Palpitations    Current Outpatient Prescriptions  Medication Sig Dispense Refill  . amLODipine (NORVASC) 5 MG tablet TAKE 1 TABLET (5 MG TOTAL) BY MOUTH DAILY. 30 tablet 6  . aspirin EC 81 MG tablet Take 81 mg by mouth daily.    . isosorbide mononitrate (IMDUR) 30 MG 24 hr tablet Take 0.5 tablets (15 mg total) by mouth daily. 15 tablet 6  . lisinopril (PRINIVIL,ZESTRIL) 40 MG tablet Take 40 mg by mouth daily.     Marland Kitchen lovastatin (MEVACOR) 10 MG tablet Take 1 tablet (10 mg total) by mouth at bedtime. 30 tablet 11  . morphine (MS CONTIN) 60 MG 12 hr tablet Take 1 tablet (60 mg total) by mouth every 8 (eight) hours. Take 1 tablet every 8 hours. 90 tablet 0  . nitroGLYCERIN (NITROSTAT) 0.4 MG SL tablet PLACE 1 TABLET UNDER TONGUE IF NEEDED FOR CHEST PAIN..UP TO 3 DOSES 25 tablet 3  . omeprazole (PRILOSEC) 40 MG capsule Take 40 mg by mouth daily.    . Oxycodone HCl 10 MG TABS Take 1 tablet (10 mg total) by mouth every 4 (four) hours as needed. 30 tablet 0   No current  facility-administered medications for this visit.    Past Medical History  Diagnosis Date  . Hypertension   . DVT (deep venous thrombosis) ([place]) ~ 2013  . Rectal cancer ([place])   . Coronary artery disease     a. 03/2013: abnl nuc -> LHC s/p DES to LCx, residual moderate disease in LAD (med rx unless refractory angina). b. Not on BB due to bradycardia.  Marland Kitchen History of blood transfusion     "once; after throwing up alot of blood" (04/17/2013)  . GERD (gastroesophageal reflux disease)   . Chronic lower back pain     a. Followed by [facility] management at [facility].  . PAD (peripheral artery disease) ([employer])     a. Occlusion of the right internal iliac artery, with significant atherosclerosis in the left internal iliac which was not amenable to reconstruction per notes from [facility].  . LV dysfunction     a. EF 45% in 03/2013.  . Tobacco abuse   . Colon cancer ([place])     rectal cancer  . Pulmonary nodules 09/30/2014    Past Surgical History  Procedure Laterality Date  . Colostomy    . Abdominal surgery  1990's    'for stomach ulcers" (04/17/2013)  . Coronary angioplasty with stent placement  04/17/2013    "?1" (04/17/2013)  . Inguinal hernia repair Bilateral 1990's  . Colostomy takedown  2013  . Colectomy  2012    "for rectal cancer" (04/17/2013)  . Left heart catheterization with coronary angiogram N/A 04/17/2013    Procedure: LEFT HEART CATHETERIZATION WITH CORONARY ANGIOGRAM;  Surgeon: Peter M Martinique, MD;  Location: [facility] CATH LAB;  Service: Cardiovascular;  Laterality: N/A;  . Percutaneous stent intervention  04/17/2013    Procedure: PERCUTANEOUS STENT INTERVENTION;  Surgeon: Peter M Martinique, MD;  Location: [facility] CATH LAB;  Service: Cardiovascular;;  . Peripheral vascular catheterization N/A 06/14/2015    Procedure: Abdominal Aortogram;  Surgeon: Elam Dutch, MD;  Location: [place] CV LAB;  Service: Cardiovascular;  Laterality: N/A;  . Peripheral vascular catheterization Bilateral 06/14/2015      Procedure: Lower Extremity Angiography;  Surgeon: Elam Dutch, MD;  Location: [place] CV LAB;  Service: Cardiovascular;  Laterality: Bilateral;  . Femoral-popliteal bypass graft Left 07/02/2015    Procedure: BYPASS GRAFT LEFT COMMON FEMORAL ARTERY TO LEFT ABOVE KNEE POPLITEAL ARTERY - USING LEFT GREATER SAPPHENOUS VEIN;  Surgeon: Elam Dutch, MD;  Location: [place];  Service: Vascular;  Laterality: Left;  . Vein harvest Left 07/02/2015    Procedure: VEIN HARVEST - LEFT GREATER SAPPHENOUS VEIN;  Surgeon: Elam Dutch, MD;  Location: [facility] OR;  Service: Vascular;  Laterality: Left;    Social History   Social History  . Marital Status: Married    Spouse Name: N/A  . Number of Children: N/A  . Years of Education: N/A   Occupational History  . Not on file.   Social History Main Topics  . Smoking status: Light Tobacco Smoker -- 0.25 packs/day for 40 years    Types: Cigarettes    Start date: 03/14/1974  . Smokeless tobacco: Never Used     Comment: 5-6 per day 06/12/15  . Alcohol Use: 4.2 oz/week    6 Cans of beer, 1 Shots of liquor per week     Comment: 04/17/2013 "bout a 6 pack/wk"  . Drug Use: Yes    Special: Marijuana     Comment: about a week ago  . Sexual Activity:    Partners: Male    Patent examiner Protection: None   Other Topics Concern  . Not on file   Social History Narrative     Filed Vitals:   08/21/15 1145  BP: 112/64  Pulse: 95  Height: '5\' 9"'$  (1.753 m)  Weight: 172 lb 3.2 oz (78.109 kg)  SpO2: 97%    PHYSICAL EXAM General: NAD HEENT: Normal. Neck: No JVD, no thyromegaly. Lungs: Clear to auscultation bilaterally with normal respiratory effort. CV: Nondisplaced PMI. Regular rate and rhythm, normal S1/S2, no S3/S4, no murmur. No pretibial or periankle edema. No carotid bruit.Warm extremities.  Abdomen: Soft, nontender,no distention.  Neurologic: Alert and oriented x 3.  Psych: Normal affect. Skin: Normal. Musculoskeletal: No gross  deformities. Extremities: No clubbing or cyanosis.   ECG: Most recent ECG reviewed.      ASSESSMENT AND PLAN: 1. CAD: Stable ischemic heart disease. Continue ASA, statin, and Imdur.   2. Essential HTN: Controlled. No changes.   3. Hyperlipidemia: LDL 96, HDL 45 in 03/2013. On high dose Lipitor 80 mg daily. Will repeat lipids (he did not obtain, previously ordered at last clinic visit).  4. Tobacco abuse: Extensive counseling previously provided with respect to cessation. Trying to quit.   5. PVD: Underwent left femoral to above-the-knee popliteal bypass for claudication on 07/02/15. Follows with vascular surgery. Continue ASA and statin. Tobacco cessation is paramount.   Dispo: f/u 6 months.  Kate Sable, M.D., F.A.C.C.

## 2015-08-23 DIAGNOSIS — R2689 Other abnormalities of gait and mobility: Secondary | ICD-10-CM | POA: Diagnosis not present

## 2015-08-23 DIAGNOSIS — F17210 Nicotine dependence, cigarettes, uncomplicated: Secondary | ICD-10-CM | POA: Diagnosis not present

## 2015-08-23 DIAGNOSIS — C20 Malignant neoplasm of rectum: Secondary | ICD-10-CM | POA: Diagnosis not present

## 2015-08-23 DIAGNOSIS — I10 Essential (primary) hypertension: Secondary | ICD-10-CM | POA: Diagnosis not present

## 2015-08-23 DIAGNOSIS — I739 Peripheral vascular disease, unspecified: Secondary | ICD-10-CM | POA: Diagnosis not present

## 2015-08-23 DIAGNOSIS — G894 Chronic pain syndrome: Secondary | ICD-10-CM | POA: Diagnosis not present

## 2015-08-23 DIAGNOSIS — T8189XD Other complications of procedures, not elsewhere classified, subsequent encounter: Secondary | ICD-10-CM | POA: Diagnosis not present

## 2015-08-23 DIAGNOSIS — Z95828 Presence of other vascular implants and grafts: Secondary | ICD-10-CM | POA: Diagnosis not present

## 2015-08-26 DIAGNOSIS — R2689 Other abnormalities of gait and mobility: Secondary | ICD-10-CM | POA: Diagnosis not present

## 2015-08-26 DIAGNOSIS — F17210 Nicotine dependence, cigarettes, uncomplicated: Secondary | ICD-10-CM | POA: Diagnosis not present

## 2015-08-26 DIAGNOSIS — T8189XD Other complications of procedures, not elsewhere classified, subsequent encounter: Secondary | ICD-10-CM | POA: Diagnosis not present

## 2015-08-26 DIAGNOSIS — G894 Chronic pain syndrome: Secondary | ICD-10-CM | POA: Diagnosis not present

## 2015-08-26 DIAGNOSIS — Z95828 Presence of other vascular implants and grafts: Secondary | ICD-10-CM | POA: Diagnosis not present

## 2015-08-26 DIAGNOSIS — C20 Malignant neoplasm of rectum: Secondary | ICD-10-CM | POA: Diagnosis not present

## 2015-08-26 DIAGNOSIS — I739 Peripheral vascular disease, unspecified: Secondary | ICD-10-CM | POA: Diagnosis not present

## 2015-08-26 DIAGNOSIS — I10 Essential (primary) hypertension: Secondary | ICD-10-CM | POA: Diagnosis not present

## 2015-08-30 DIAGNOSIS — C20 Malignant neoplasm of rectum: Secondary | ICD-10-CM | POA: Diagnosis not present

## 2015-08-30 DIAGNOSIS — Z95828 Presence of other vascular implants and grafts: Secondary | ICD-10-CM | POA: Diagnosis not present

## 2015-08-30 DIAGNOSIS — T8189XD Other complications of procedures, not elsewhere classified, subsequent encounter: Secondary | ICD-10-CM | POA: Diagnosis not present

## 2015-08-30 DIAGNOSIS — F17210 Nicotine dependence, cigarettes, uncomplicated: Secondary | ICD-10-CM | POA: Diagnosis not present

## 2015-08-30 DIAGNOSIS — G894 Chronic pain syndrome: Secondary | ICD-10-CM | POA: Diagnosis not present

## 2015-08-30 DIAGNOSIS — I739 Peripheral vascular disease, unspecified: Secondary | ICD-10-CM | POA: Diagnosis not present

## 2015-08-30 DIAGNOSIS — I10 Essential (primary) hypertension: Secondary | ICD-10-CM | POA: Diagnosis not present

## 2015-08-30 DIAGNOSIS — R2689 Other abnormalities of gait and mobility: Secondary | ICD-10-CM | POA: Diagnosis not present

## 2015-09-04 DIAGNOSIS — F17210 Nicotine dependence, cigarettes, uncomplicated: Secondary | ICD-10-CM | POA: Diagnosis not present

## 2015-09-04 DIAGNOSIS — Z95828 Presence of other vascular implants and grafts: Secondary | ICD-10-CM | POA: Diagnosis not present

## 2015-09-04 DIAGNOSIS — R2689 Other abnormalities of gait and mobility: Secondary | ICD-10-CM | POA: Diagnosis not present

## 2015-09-04 DIAGNOSIS — C20 Malignant neoplasm of rectum: Secondary | ICD-10-CM | POA: Diagnosis not present

## 2015-09-04 DIAGNOSIS — T8189XD Other complications of procedures, not elsewhere classified, subsequent encounter: Secondary | ICD-10-CM | POA: Diagnosis not present

## 2015-09-04 DIAGNOSIS — G894 Chronic pain syndrome: Secondary | ICD-10-CM | POA: Diagnosis not present

## 2015-09-04 DIAGNOSIS — I739 Peripheral vascular disease, unspecified: Secondary | ICD-10-CM | POA: Diagnosis not present

## 2015-09-04 DIAGNOSIS — I10 Essential (primary) hypertension: Secondary | ICD-10-CM | POA: Diagnosis not present

## 2015-09-06 ENCOUNTER — Encounter: Payer: Self-pay | Admitting: Vascular Surgery

## 2015-09-10 DIAGNOSIS — F17210 Nicotine dependence, cigarettes, uncomplicated: Secondary | ICD-10-CM | POA: Diagnosis not present

## 2015-09-10 DIAGNOSIS — T8189XD Other complications of procedures, not elsewhere classified, subsequent encounter: Secondary | ICD-10-CM | POA: Diagnosis not present

## 2015-09-10 DIAGNOSIS — I10 Essential (primary) hypertension: Secondary | ICD-10-CM | POA: Diagnosis not present

## 2015-09-10 DIAGNOSIS — Z95828 Presence of other vascular implants and grafts: Secondary | ICD-10-CM | POA: Diagnosis not present

## 2015-09-10 DIAGNOSIS — G894 Chronic pain syndrome: Secondary | ICD-10-CM | POA: Diagnosis not present

## 2015-09-10 DIAGNOSIS — R2689 Other abnormalities of gait and mobility: Secondary | ICD-10-CM | POA: Diagnosis not present

## 2015-09-10 DIAGNOSIS — C20 Malignant neoplasm of rectum: Secondary | ICD-10-CM | POA: Diagnosis not present

## 2015-09-10 DIAGNOSIS — I739 Peripheral vascular disease, unspecified: Secondary | ICD-10-CM | POA: Diagnosis not present

## 2015-09-12 ENCOUNTER — Encounter: Payer: Self-pay | Admitting: Vascular Surgery

## 2015-09-12 ENCOUNTER — Ambulatory Visit (INDEPENDENT_AMBULATORY_CARE_PROVIDER_SITE_OTHER): Payer: Commercial Managed Care - HMO | Admitting: Vascular Surgery

## 2015-09-12 VITALS — BP 136/85 | HR 96 | Temp 98.1°F | Ht 72.0 in | Wt 177.0 lb

## 2015-09-12 DIAGNOSIS — I739 Peripheral vascular disease, unspecified: Secondary | ICD-10-CM

## 2015-09-12 NOTE — Progress Notes (Signed)
  Patient is a 54 year old male who returns today for further follow-up after left femoral to above-knee popliteal bypass for claudication on October 4. He has an open wound at his above-knee incision which she has been packing daily.    Physical exam:       Filed Vitals:   09/12/15 0856  BP: 136/85  Pulse: 96  Temp: 98.1 F (36.7 C)  TempSrc: Oral  Height: 6' (1.829 m)  Weight: 177 lb (80.287 kg)  SpO2: 97%     Left lower extremity: 3 x 2 cm open wound just above the knee medial portion of the thigh healthy-appearing BP granulation tissue no. Drainage no surrounding erythema 2+ dorsalis pedis and posterior tibial pulse   Assessment: Slowly healing left above-knee popliteal incision with patent bypass graft.   Plan: Continue local wound care. Follow-up one month.  At that appointment we will also obtain a graft duplex and bilateral ABIs. Smoking cessation again emphasized.  Ruta Hinds, MD [facility] [place] Office: [phone] Pager: [phone]

## 2015-09-12 NOTE — Addendum Note (Signed)
Addended by: Dorthula Rue L on: 09/12/2015 02:48 PM   Modules accepted: Orders

## 2015-09-20 ENCOUNTER — Telehealth (HOSPITAL_COMMUNITY): Payer: Self-pay | Admitting: Emergency Medicine

## 2015-09-20 ENCOUNTER — Encounter (HOSPITAL_COMMUNITY): Payer: Commercial Managed Care - HMO | Attending: Oncology | Admitting: Oncology

## 2015-09-20 ENCOUNTER — Encounter (HOSPITAL_COMMUNITY): Payer: Commercial Managed Care - HMO

## 2015-09-20 ENCOUNTER — Encounter (HOSPITAL_COMMUNITY): Payer: Self-pay | Admitting: Oncology

## 2015-09-20 VITALS — BP 121/88 | HR 100 | Temp 98.0°F | Resp 18 | Wt 176.8 lb

## 2015-09-20 DIAGNOSIS — R918 Other nonspecific abnormal finding of lung field: Secondary | ICD-10-CM

## 2015-09-20 DIAGNOSIS — G894 Chronic pain syndrome: Secondary | ICD-10-CM | POA: Diagnosis not present

## 2015-09-20 DIAGNOSIS — C20 Malignant neoplasm of rectum: Secondary | ICD-10-CM

## 2015-09-20 DIAGNOSIS — I10 Essential (primary) hypertension: Secondary | ICD-10-CM | POA: Diagnosis not present

## 2015-09-20 DIAGNOSIS — I739 Peripheral vascular disease, unspecified: Secondary | ICD-10-CM | POA: Diagnosis not present

## 2015-09-20 DIAGNOSIS — F17210 Nicotine dependence, cigarettes, uncomplicated: Secondary | ICD-10-CM | POA: Diagnosis not present

## 2015-09-20 DIAGNOSIS — R2689 Other abnormalities of gait and mobility: Secondary | ICD-10-CM | POA: Diagnosis not present

## 2015-09-20 DIAGNOSIS — Z23 Encounter for immunization: Secondary | ICD-10-CM | POA: Diagnosis not present

## 2015-09-20 DIAGNOSIS — Z95828 Presence of other vascular implants and grafts: Secondary | ICD-10-CM | POA: Diagnosis not present

## 2015-09-20 DIAGNOSIS — T8189XD Other complications of procedures, not elsewhere classified, subsequent encounter: Secondary | ICD-10-CM | POA: Diagnosis not present

## 2015-09-20 LAB — CBC WITH DIFFERENTIAL/PLATELET
Basophils Absolute: 0 10*3/uL (ref 0.0–0.1)
Basophils Relative: 0 %
Eosinophils Absolute: 0.3 10*3/uL (ref 0.0–0.7)
Eosinophils Relative: 4 %
HCT: 44.1 % (ref 39.0–52.0)
Hemoglobin: 14.9 g/dL (ref 13.0–17.0)
Lymphocytes Relative: 28 %
Lymphs Abs: 2.3 10*3/uL (ref 0.7–4.0)
MCH: 32.7 pg (ref 26.0–34.0)
MCHC: 33.8 g/dL (ref 30.0–36.0)
MCV: 96.7 fL (ref 78.0–100.0)
Monocytes Absolute: 0.5 10*3/uL (ref 0.1–1.0)
Monocytes Relative: 6 %
Neutro Abs: 5 10*3/uL (ref 1.7–7.7)
Neutrophils Relative %: 62 %
Platelets: 235 10*3/uL (ref 150–400)
RBC: 4.56 MIL/uL (ref 4.22–5.81)
RDW: 15.2 % (ref 11.5–15.5)
WBC: 8 10*3/uL (ref 4.0–10.5)

## 2015-09-20 LAB — COMPREHENSIVE METABOLIC PANEL
ALT: 14 U/L — ABNORMAL LOW (ref 17–63)
AST: 17 U/L (ref 15–41)
Albumin: 3.7 g/dL (ref 3.5–5.0)
Alkaline Phosphatase: 69 U/L (ref 38–126)
Anion gap: 6 (ref 5–15)
BUN: 12 mg/dL (ref 6–20)
CO2: 28 mmol/L (ref 22–32)
Calcium: 9.2 mg/dL (ref 8.9–10.3)
Chloride: 107 mmol/L (ref 101–111)
Creatinine, Ser: 1.22 mg/dL (ref 0.61–1.24)
GFR calc Af Amer: >60 mL/min (ref >60)
GFR calc non Af Amer: >60 mL/min (ref >60)
Glucose, Bld: 96 mg/dL (ref 65–99)
Potassium: 4 mmol/L (ref 3.5–5.1)
Sodium: 141 mmol/L (ref 135–145)
Total Bilirubin: 0.6 mg/dL (ref 0.3–1.2)
Total Protein: 7.7 g/dL (ref 6.5–8.1)

## 2015-09-20 MED ORDER — MORPHINE SULFATE ER 60 MG PO TBCR: 60.0000 mg | EXTENDED_RELEASE_TABLET | Freq: Three times a day (TID) | ORAL | Status: DC

## 2015-09-20 MED ORDER — OXYCODONE HCL 10 MG PO TABS: ORAL_TABLET | ORAL | Status: DC

## 2015-09-20 MED ORDER — INFLUENZA VAC SPLIT QUAD 0.5 ML IM SUSY
0.5000 mL | PREFILLED_SYRINGE | Freq: Once | INTRAMUSCULAR | Status: AC
  Administered 2015-09-20: 0.5 mL via INTRAMUSCULAR
  Filled 2015-09-20: qty 0.5

## 2015-09-20 NOTE — Assessment & Plan Note (Addendum)
Stable.  CT chest on 12/24/2014 demonstrates stability of pulmonary nodules.  Repeat CT imaging of chest is scheduled for 12/25/2014.

## 2015-09-20 NOTE — Progress Notes (Signed)
Juan Wheeler given flu shot today

## 2015-09-20 NOTE — Telephone Encounter (Signed)
-----   Message from [name], [name] sent at 09/20/2015 12:34 PM EST ----- I have reviewed all lab results which are normal or stable. Please inform the patient.  CEA is pending.

## 2015-09-20 NOTE — Assessment & Plan Note (Addendum)
Secondary to LAR for rectal cancer.    On MS Contin 60 mg every 8 hours and Oxycodone 10 mg 1-2 tablets every 4 hours PRN pain #100 with excellent pain control.    Patient also on bowel regimen to maintain BMs and avoid constipation.  Three months worth of Rx for aforementioned pain medication provided at today's visit.    Return in 3 months for follow-up.  Influenza vaccine given today.

## 2015-09-20 NOTE — Patient Instructions (Addendum)
[place] at [facility] Discharge Instructions  RECOMMENDATIONS MADE BY THE CONSULTANT AND ANY TEST RESULTS WILL BE SENT TO YOUR REFERRING PHYSICIAN.   Exam completed by Kirby Crigler PA today Lab work today Flu shot today Constipation sheet given for instruction if you need it. Pain medication refills CT scan of your chest in March Return to see the doctor after CT scans Please call the clinic if you have any questions or concerns    Thank you for choosing [place] at [facility] to provide your oncology and hematology care.  To afford each patient quality time with our provider, please arrive at least 15 minutes before your scheduled appointment time.    You need to re-schedule your appointment should you arrive 10 or more minutes late.  We strive to give you quality time with our providers, and arriving late affects you and other patients whose appointments are after yours.  Also, if you no show three or more times for appointments you may be dismissed from the clinic at the providers discretion.     Again, thank you for choosing [facility].  Our hope is that these requests will decrease the amount of time that you wait before being seen by our physicians.       _____________________________________________________________  Should you have questions after your visit to [facility], please contact our office at [phone] between the hours of 8:30 a.m. and 4:30 p.m.  Voicemails left after 4:30 p.m. will not be returned until the following business day.  For prescription refill requests, have your pharmacy contact our office.

## 2015-09-20 NOTE — Assessment & Plan Note (Addendum)
NED.    Labs in 6 months: CBC diff, CMET, CEA  Return in 3 months for follow-up
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
## 2015-09-20 NOTE — Telephone Encounter (Signed)
Pt notified of normal lab results.

## 2015-09-21 LAB — CEA: CEA: 4.9 ng/mL — ABNORMAL HIGH (ref 0.0–4.7)

## 2015-09-24 DIAGNOSIS — Z95828 Presence of other vascular implants and grafts: Secondary | ICD-10-CM | POA: Diagnosis not present

## 2015-09-24 DIAGNOSIS — I739 Peripheral vascular disease, unspecified: Secondary | ICD-10-CM | POA: Diagnosis not present

## 2015-09-24 DIAGNOSIS — F17210 Nicotine dependence, cigarettes, uncomplicated: Secondary | ICD-10-CM | POA: Diagnosis not present

## 2015-09-24 DIAGNOSIS — G894 Chronic pain syndrome: Secondary | ICD-10-CM | POA: Diagnosis not present

## 2015-09-24 DIAGNOSIS — T8189XD Other complications of procedures, not elsewhere classified, subsequent encounter: Secondary | ICD-10-CM | POA: Diagnosis not present

## 2015-09-24 DIAGNOSIS — I10 Essential (primary) hypertension: Secondary | ICD-10-CM | POA: Diagnosis not present

## 2015-09-24 DIAGNOSIS — C20 Malignant neoplasm of rectum: Secondary | ICD-10-CM | POA: Diagnosis not present

## 2015-09-24 DIAGNOSIS — R2689 Other abnormalities of gait and mobility: Secondary | ICD-10-CM | POA: Diagnosis not present

## 2015-09-25 ENCOUNTER — Other Ambulatory Visit: Payer: Self-pay | Admitting: Vascular Surgery

## 2015-09-25 DIAGNOSIS — Z48812 Encounter for surgical aftercare following surgery on the circulatory system: Secondary | ICD-10-CM

## 2015-09-25 DIAGNOSIS — I739 Peripheral vascular disease, unspecified: Secondary | ICD-10-CM

## 2015-09-26 ENCOUNTER — Ambulatory Visit (INDEPENDENT_AMBULATORY_CARE_PROVIDER_SITE_OTHER)
Admission: RE | Admit: 2015-09-26 | Discharge: 2015-09-26 | Disposition: A | Discharge status: Home / Self Care | Payer: Commercial Managed Care - HMO | Source: Ambulatory Visit | Attending: Vascular Surgery | Admitting: Vascular Surgery

## 2015-09-26 ENCOUNTER — Ambulatory Visit (HOSPITAL_COMMUNITY)
Admission: RE | Admit: 2015-09-26 | Discharge: 2015-09-26 | Disposition: A | Discharge status: Home / Self Care | Payer: Commercial Managed Care - HMO | Source: Ambulatory Visit | Attending: Vascular Surgery | Admitting: Vascular Surgery

## 2015-09-26 DIAGNOSIS — I739 Peripheral vascular disease, unspecified: Secondary | ICD-10-CM

## 2015-09-26 DIAGNOSIS — Z48812 Encounter for surgical aftercare following surgery on the circulatory system: Secondary | ICD-10-CM | POA: Diagnosis not present

## 2015-09-26 DIAGNOSIS — I10 Essential (primary) hypertension: Secondary | ICD-10-CM | POA: Insufficient documentation

## 2015-09-26 DIAGNOSIS — R938 Abnormal findings on diagnostic imaging of other specified body structures: Secondary | ICD-10-CM | POA: Insufficient documentation

## 2015-09-26 DIAGNOSIS — F17200 Nicotine dependence, unspecified, uncomplicated: Secondary | ICD-10-CM | POA: Diagnosis not present

## 2015-10-02 DIAGNOSIS — F17210 Nicotine dependence, cigarettes, uncomplicated: Secondary | ICD-10-CM | POA: Diagnosis not present

## 2015-10-02 DIAGNOSIS — R2689 Other abnormalities of gait and mobility: Secondary | ICD-10-CM | POA: Diagnosis not present

## 2015-10-02 DIAGNOSIS — T8189XD Other complications of procedures, not elsewhere classified, subsequent encounter: Secondary | ICD-10-CM | POA: Diagnosis not present

## 2015-10-02 DIAGNOSIS — Z95828 Presence of other vascular implants and grafts: Secondary | ICD-10-CM | POA: Diagnosis not present

## 2015-10-02 DIAGNOSIS — I10 Essential (primary) hypertension: Secondary | ICD-10-CM | POA: Diagnosis not present

## 2015-10-02 DIAGNOSIS — C20 Malignant neoplasm of rectum: Secondary | ICD-10-CM | POA: Diagnosis not present

## 2015-10-02 DIAGNOSIS — I739 Peripheral vascular disease, unspecified: Secondary | ICD-10-CM | POA: Diagnosis not present

## 2015-10-02 DIAGNOSIS — G894 Chronic pain syndrome: Secondary | ICD-10-CM | POA: Diagnosis not present

## 2015-10-04 ENCOUNTER — Encounter: Payer: Self-pay | Admitting: Vascular Surgery

## 2015-10-10 ENCOUNTER — Ambulatory Visit: Payer: Commercial Managed Care - HMO | Admitting: Vascular Surgery

## 2015-10-10 DIAGNOSIS — C20 Malignant neoplasm of rectum: Secondary | ICD-10-CM | POA: Diagnosis not present

## 2015-10-10 DIAGNOSIS — R2689 Other abnormalities of gait and mobility: Secondary | ICD-10-CM | POA: Diagnosis not present

## 2015-10-10 DIAGNOSIS — Z95828 Presence of other vascular implants and grafts: Secondary | ICD-10-CM | POA: Diagnosis not present

## 2015-10-10 DIAGNOSIS — T8189XD Other complications of procedures, not elsewhere classified, subsequent encounter: Secondary | ICD-10-CM | POA: Diagnosis not present

## 2015-10-10 DIAGNOSIS — I10 Essential (primary) hypertension: Secondary | ICD-10-CM | POA: Diagnosis not present

## 2015-10-10 DIAGNOSIS — G894 Chronic pain syndrome: Secondary | ICD-10-CM | POA: Diagnosis not present

## 2015-10-10 DIAGNOSIS — I739 Peripheral vascular disease, unspecified: Secondary | ICD-10-CM | POA: Diagnosis not present

## 2015-10-10 DIAGNOSIS — F17210 Nicotine dependence, cigarettes, uncomplicated: Secondary | ICD-10-CM | POA: Diagnosis not present

## 2015-10-16 ENCOUNTER — Encounter: Payer: Self-pay | Admitting: Vascular Surgery

## 2015-10-17 DIAGNOSIS — F17210 Nicotine dependence, cigarettes, uncomplicated: Secondary | ICD-10-CM | POA: Diagnosis not present

## 2015-10-17 DIAGNOSIS — G894 Chronic pain syndrome: Secondary | ICD-10-CM | POA: Diagnosis not present

## 2015-10-17 DIAGNOSIS — T8189XD Other complications of procedures, not elsewhere classified, subsequent encounter: Secondary | ICD-10-CM | POA: Diagnosis not present

## 2015-10-17 DIAGNOSIS — R2689 Other abnormalities of gait and mobility: Secondary | ICD-10-CM | POA: Diagnosis not present

## 2015-10-17 DIAGNOSIS — C20 Malignant neoplasm of rectum: Secondary | ICD-10-CM | POA: Diagnosis not present

## 2015-10-17 DIAGNOSIS — I10 Essential (primary) hypertension: Secondary | ICD-10-CM | POA: Diagnosis not present

## 2015-10-17 DIAGNOSIS — I739 Peripheral vascular disease, unspecified: Secondary | ICD-10-CM | POA: Diagnosis not present

## 2015-10-17 DIAGNOSIS — Z95828 Presence of other vascular implants and grafts: Secondary | ICD-10-CM | POA: Diagnosis not present

## 2015-10-22 DIAGNOSIS — F17210 Nicotine dependence, cigarettes, uncomplicated: Secondary | ICD-10-CM | POA: Diagnosis not present

## 2015-10-22 DIAGNOSIS — G894 Chronic pain syndrome: Secondary | ICD-10-CM | POA: Diagnosis not present

## 2015-10-22 DIAGNOSIS — I739 Peripheral vascular disease, unspecified: Secondary | ICD-10-CM | POA: Diagnosis not present

## 2015-10-22 DIAGNOSIS — T8189XD Other complications of procedures, not elsewhere classified, subsequent encounter: Secondary | ICD-10-CM | POA: Diagnosis not present

## 2015-10-22 DIAGNOSIS — Z95828 Presence of other vascular implants and grafts: Secondary | ICD-10-CM | POA: Diagnosis not present

## 2015-10-22 DIAGNOSIS — I10 Essential (primary) hypertension: Secondary | ICD-10-CM | POA: Diagnosis not present

## 2015-10-22 DIAGNOSIS — R2689 Other abnormalities of gait and mobility: Secondary | ICD-10-CM | POA: Diagnosis not present

## 2015-10-22 DIAGNOSIS — C20 Malignant neoplasm of rectum: Secondary | ICD-10-CM | POA: Diagnosis not present

## 2015-10-24 ENCOUNTER — Encounter: Payer: Self-pay | Admitting: Vascular Surgery

## 2015-10-24 ENCOUNTER — Ambulatory Visit (INDEPENDENT_AMBULATORY_CARE_PROVIDER_SITE_OTHER): Payer: Commercial Managed Care - HMO | Admitting: Vascular Surgery

## 2015-10-24 VITALS — BP 123/88 | HR 84 | Ht 72.0 in | Wt 173.0 lb

## 2015-10-24 DIAGNOSIS — I739 Peripheral vascular disease, unspecified: Secondary | ICD-10-CM

## 2015-10-24 NOTE — Progress Notes (Signed)
Patient is a 55 year old male who returns today for further follow-up after left femoral to above-knee popliteal bypass with vein for claudication on October 4.  His lower extremity wounds have now completely healed. He has no claudication symptoms. He has no right leg symptoms.   Physical exam:       Filed Vitals:   10/24/15 1401  BP: 123/88  Pulse: 84  Height: 6' (1.829 m)  Weight: 173 lb (78.472 kg)  SpO2: 96%      Left lower extremity:  All extremity incisions well-healed at this point 2+ dorsalis pedis and posterior tibial pulse    data: Patient underwent graft duplex scan and bilateral ABIs on 09/26/2015. I reviewed the study today. There were some increased velocities of the distal anastomosis on the left side with 339 cm/s. Our, ABI was 0.95 and he has a palpable pulse and no symptoms. ABI on the right side was 0.98.  Assessment: patent bypass graft  Possible distal anastomotic narrowing. Would consider intervention if decrease in ABIs worsening of velocities or other symptoms.  Plan:  Follow-up 3 months graft duplex scan and ABIs. Patient was also informed that  Ifhe develops any claudication symptoms prior to his office visit he should call sooner.  Ruta Hinds, MD [facility] [place] Office: [phone] Pager: [phone]

## 2015-10-25 NOTE — Addendum Note (Signed)
Addended by: Dorthula Rue L on: 10/25/2015 10:02 AM   Modules accepted: Orders

## 2015-10-30 DIAGNOSIS — T8189XD Other complications of procedures, not elsewhere classified, subsequent encounter: Secondary | ICD-10-CM | POA: Diagnosis not present

## 2015-10-30 DIAGNOSIS — I10 Essential (primary) hypertension: Secondary | ICD-10-CM | POA: Diagnosis not present

## 2015-10-30 DIAGNOSIS — R2689 Other abnormalities of gait and mobility: Secondary | ICD-10-CM | POA: Diagnosis not present

## 2015-10-30 DIAGNOSIS — C20 Malignant neoplasm of rectum: Secondary | ICD-10-CM | POA: Diagnosis not present

## 2015-10-30 DIAGNOSIS — F17210 Nicotine dependence, cigarettes, uncomplicated: Secondary | ICD-10-CM | POA: Diagnosis not present

## 2015-10-30 DIAGNOSIS — G894 Chronic pain syndrome: Secondary | ICD-10-CM | POA: Diagnosis not present

## 2015-10-30 DIAGNOSIS — I739 Peripheral vascular disease, unspecified: Secondary | ICD-10-CM | POA: Diagnosis not present

## 2015-10-30 DIAGNOSIS — Z95828 Presence of other vascular implants and grafts: Secondary | ICD-10-CM | POA: Diagnosis not present

## 2015-11-01 ENCOUNTER — Other Ambulatory Visit: Payer: Self-pay | Admitting: Cardiovascular Disease

## 2015-11-05 DIAGNOSIS — F17210 Nicotine dependence, cigarettes, uncomplicated: Secondary | ICD-10-CM | POA: Diagnosis not present

## 2015-11-05 DIAGNOSIS — C20 Malignant neoplasm of rectum: Secondary | ICD-10-CM | POA: Diagnosis not present

## 2015-11-05 DIAGNOSIS — R2689 Other abnormalities of gait and mobility: Secondary | ICD-10-CM | POA: Diagnosis not present

## 2015-11-05 DIAGNOSIS — I739 Peripheral vascular disease, unspecified: Secondary | ICD-10-CM | POA: Diagnosis not present

## 2015-11-05 DIAGNOSIS — T8189XD Other complications of procedures, not elsewhere classified, subsequent encounter: Secondary | ICD-10-CM | POA: Diagnosis not present

## 2015-11-05 DIAGNOSIS — I10 Essential (primary) hypertension: Secondary | ICD-10-CM | POA: Diagnosis not present

## 2015-11-05 DIAGNOSIS — G894 Chronic pain syndrome: Secondary | ICD-10-CM | POA: Diagnosis not present

## 2015-11-05 DIAGNOSIS — Z95828 Presence of other vascular implants and grafts: Secondary | ICD-10-CM | POA: Diagnosis not present

## 2015-12-03 ENCOUNTER — Telehealth: Payer: Self-pay | Admitting: Cardiovascular Disease

## 2015-12-04 ENCOUNTER — Encounter: Payer: Self-pay | Admitting: Cardiovascular Disease

## 2015-12-04 ENCOUNTER — Ambulatory Visit (INDEPENDENT_AMBULATORY_CARE_PROVIDER_SITE_OTHER): Payer: Commercial Managed Care - HMO | Admitting: Cardiovascular Disease

## 2015-12-04 VITALS — BP 134/80 | HR 91 | Ht 69.0 in | Wt 178.0 lb

## 2015-12-04 DIAGNOSIS — E785 Hyperlipidemia, unspecified: Secondary | ICD-10-CM

## 2015-12-04 DIAGNOSIS — Z716 Tobacco abuse counseling: Secondary | ICD-10-CM

## 2015-12-04 DIAGNOSIS — I10 Essential (primary) hypertension: Secondary | ICD-10-CM

## 2015-12-04 DIAGNOSIS — I739 Peripheral vascular disease, unspecified: Secondary | ICD-10-CM | POA: Diagnosis not present

## 2015-12-04 DIAGNOSIS — I25118 Atherosclerotic heart disease of native coronary artery with other forms of angina pectoris: Secondary | ICD-10-CM

## 2015-12-04 MED ORDER — ISOSORBIDE MONONITRATE ER 30 MG PO TB24: ORAL_TABLET | ORAL | Status: DC

## 2015-12-04 MED ORDER — AMLODIPINE BESYLATE 5 MG PO TABS: 5.0000 mg | ORAL_TABLET | Freq: Every day | ORAL | Status: DC

## 2015-12-04 NOTE — Patient Instructions (Signed)
Medication Instructions:  Your physician recommends that you continue on your current medications as directed. Please refer to the Current Medication list given to you today.   Labwork: I will request a copy of labs from your PCP  Testing/Procedures: NONE  Follow-Up: Your physician wants you to follow-up in: 6 MONTHS.   You will receive a reminder letter in the mail two months in advance. If you don't receive a letter, please call our office to schedule the follow-up appointment.   Any Other Special Instructions Will Be Listed Below (If Applicable).     If you need a refill on your cardiac medications before your next appointment, please call your pharmacy.

## 2015-12-04 NOTE — Progress Notes (Signed)
Patient ID: Juan Wheeler, male   DOB: 1961-02-02, 55 y.o.   MRN: [id number]      SUBJECTIVE:  The patient is here for routine cardiovascular followup. He has a history of drug-eluting stent placement to the left circumflex coronary artery in July 2014 with moderate to severe obstructive disease in the distal LAD and mild disease in proximal LAD and right coronary artery. He also has hypertension, hyperlipidemia, rectal cancer, history of tobacco abuse, and peripheral vascular disease.  He underwent left femoral to above-the-knee popliteal bypass for claudication on 07/02/15.  Prior to doing so, he underwent a low risk nuclear stress test on 06/18/15 with no perfusion defects, calculated LVEF 54%.  Very seldom has chest pains. Smokes 1 pack of cigarettes every 34 hours.  ABIs on 09/26/15 showed stable right and improved left indices since 11/02/14.   Review of Systems: As per "subjective", otherwise negative.  Allergies  Allergen Reactions  . Darvocet [Propoxyphene N-Acetaminophen] Palpitations  . Propoxyphene Palpitations    Current Outpatient Prescriptions  Medication Sig Dispense Refill  . amLODipine (NORVASC) 5 MG tablet Take 1 tablet (5 mg total) by mouth daily. 30 tablet 3  . aspirin EC 81 MG tablet Take 81 mg by mouth daily.    . isosorbide mononitrate (IMDUR) 30 MG 24 hr tablet TAKE 1/2 TABLET BY MOUTH ONCE DAILY. 15 tablet 3  . lisinopril (PRINIVIL,ZESTRIL) 40 MG tablet Take 40 mg by mouth daily.     Marland Kitchen lovastatin (MEVACOR) 10 MG tablet Take 1 tablet (10 mg total) by mouth at bedtime. 30 tablet 11  . morphine (MS CONTIN) 60 MG 12 hr tablet Take 1 tablet (60 mg total) by mouth every 8 (eight) hours. Take 1 tablet every 8 hours. 90 tablet 0  . nitroGLYCERIN (NITROSTAT) 0.4 MG SL tablet PLACE 1 TABLET UNDER TONGUE IF NEEDED FOR CHEST PAIN..UP TO 3 DOSES 25 tablet 3  . omeprazole (PRILOSEC) 40 MG capsule Take 40 mg by mouth daily.    . Oxycodone HCl 10 MG TABS Take 1 or 2 tablets  every 4 hours as needed for pain.. 100 tablet 0   No current facility-administered medications for this visit.    Past Medical History  Diagnosis Date  . Hypertension   . DVT (deep venous thrombosis) ([place]) ~ 2013  . Rectal cancer ([place])   . Coronary artery disease     a. 03/2013: abnl nuc -> LHC s/p DES to LCx, residual moderate disease in LAD (med rx unless refractory angina). b. Not on BB due to bradycardia.  Marland Kitchen History of blood transfusion     "once; after throwing up alot of blood" (04/17/2013)  . GERD (gastroesophageal reflux disease)   . Chronic lower back pain     a. Followed by [facility] management at [facility].  . PAD (peripheral artery disease) ([place])     a. Occlusion of the right internal iliac artery, with significant atherosclerosis in the left internal iliac which was not amenable to reconstruction per notes from [facility]  . LV dysfunction     a. EF 45% in 03/2013.  . Tobacco abuse   . Colon cancer ([place])     rectal cancer  . Pulmonary nodules 09/30/2014    Past Surgical History  Procedure Laterality Date  . Colostomy    . Abdominal surgery  1990's    'for stomach ulcers" (04/17/2013)  . Coronary angioplasty with stent placement  04/17/2013    "?1" (04/17/2013)  . Inguinal hernia repair Bilateral 1990's  .  Colostomy takedown  2013  . Colectomy  2012    "for rectal cancer" (04/17/2013)  . Left heart catheterization with coronary angiogram N/A 04/17/2013    Procedure: LEFT HEART CATHETERIZATION WITH CORONARY ANGIOGRAM;  Surgeon: Peter M Martinique, MD;  Location: [facility] CATH LAB;  Service: Cardiovascular;  Laterality: N/A;  . Percutaneous stent intervention  04/17/2013    Procedure: PERCUTANEOUS STENT INTERVENTION;  Surgeon: Peter M Martinique, MD;  Location: [facility] CATH LAB;  Service: Cardiovascular;;  . Peripheral vascular catheterization N/A 06/14/2015    Procedure: Abdominal Aortogram;  Surgeon: Elam Dutch, MD;  Location: [place] CV LAB;  Service: Cardiovascular;  Laterality:  N/A;  . Peripheral vascular catheterization Bilateral 06/14/2015    Procedure: Lower Extremity Angiography;  Surgeon: Elam Dutch, MD;  Location: [place] CV LAB;  Service: Cardiovascular;  Laterality: Bilateral;  . Femoral-popliteal bypass graft Left 07/02/2015    Procedure: BYPASS GRAFT LEFT COMMON FEMORAL ARTERY TO LEFT ABOVE KNEE POPLITEAL ARTERY - USING LEFT GREATER SAPPHENOUS VEIN;  Surgeon: Elam Dutch, MD;  Location: [place];  Service: Vascular;  Laterality: Left;  . Vein harvest Left 07/02/2015    Procedure: VEIN HARVEST - LEFT GREATER SAPPHENOUS VEIN;  Surgeon: Elam Dutch, MD;  Location: [facility] OR;  Service: Vascular;  Laterality: Left;    Social History   Social History  . Marital Status: Married    Spouse Name: N/A  . Number of Children: N/A  . Years of Education: N/A   Occupational History  . Not on file.   Social History Main Topics  . Smoking status: Light Tobacco Smoker -- 0.25 packs/day for 40 years    Types: Cigarettes    Start date: 03/14/1974  . Smokeless tobacco: Never Used     Comment: 5-6 per day 06/12/15  . Alcohol Use: 4.2 oz/week    6 Cans of beer, 1 Shots of liquor per week     Comment: 04/17/2013 "bout a 6 pack/wk"  . Drug Use: Yes    Special: Marijuana     Comment: about a week ago  . Sexual Activity:    Partners: Male    Patent examiner Protection: None   Other Topics Concern  . Not on file   Social History Narrative     Filed Vitals:   12/04/15 0818  BP: 134/80  Pulse: 91  Height: '5\' 9"'$  (1.753 m)  Weight: 178 lb (80.74 kg)  SpO2: 96%    PHYSICAL EXAM General: NAD HEENT: Normal. Neck: No JVD, no thyromegaly. Lungs: Diminished throughout, no rales/wheezes. CV: Nondisplaced PMI.  Regular rate and rhythm, normal S1/S2, no S3/S4, no murmur. No pretibial or periankle edema.  No carotid bruit.   Abdomen: Soft, nontender, no distention.  Neurologic: Alert and oriented.  Psych: Normal affect. Skin: Normal. Musculoskeletal: No  gross deformities.  ECG: Most recent ECG reviewed.      ASSESSMENT AND PLAN: 1. CAD: Stable ischemic heart disease. Continue ASA, statin, and Imdur.   2. Essential HTN: Controlled. No changes.   3. Hyperlipidemia: Obtain copy of lipids from PCP. Continue statin.  4. Tobacco abuse: Extensive counseling previously provided with respect to cessation. Doubt willingness to quit.  5. PVD: Underwent left femoral to above-the-knee popliteal bypass for claudication on 07/02/15. Follows with vascular surgery. Most recent ABI's noted above. Continue ASA and statin. Tobacco cessation is paramount.   Dispo: f/u 6 months.   Kate Sable, M.D., F.A.C.C.

## 2015-12-10 DIAGNOSIS — M542 Cervicalgia: Secondary | ICD-10-CM | POA: Diagnosis not present

## 2015-12-10 DIAGNOSIS — I10 Essential (primary) hypertension: Secondary | ICD-10-CM | POA: Diagnosis not present

## 2015-12-10 DIAGNOSIS — C20 Malignant neoplasm of rectum: Secondary | ICD-10-CM | POA: Diagnosis not present

## 2015-12-25 ENCOUNTER — Ambulatory Visit (HOSPITAL_COMMUNITY)
Admission: RE | Admit: 2015-12-25 | Discharge: 2015-12-25 | Disposition: A | Discharge status: Home / Self Care | Payer: Commercial Managed Care - HMO | Source: Ambulatory Visit | Attending: Oncology | Admitting: Oncology

## 2015-12-25 DIAGNOSIS — R918 Other nonspecific abnormal finding of lung field: Secondary | ICD-10-CM | POA: Diagnosis not present

## 2015-12-25 DIAGNOSIS — J439 Emphysema, unspecified: Secondary | ICD-10-CM | POA: Insufficient documentation

## 2015-12-27 ENCOUNTER — Ambulatory Visit (HOSPITAL_COMMUNITY): Payer: Commercial Managed Care - HMO | Admitting: Oncology

## 2015-12-27 ENCOUNTER — Encounter (HOSPITAL_COMMUNITY): Payer: No Typology Code available for payment source | Attending: Oncology | Admitting: Oncology

## 2015-12-27 ENCOUNTER — Encounter (HOSPITAL_COMMUNITY): Payer: Self-pay | Admitting: Oncology

## 2015-12-27 VITALS — BP 140/84 | HR 75 | Temp 98.4°F | Resp 18 | Wt 181.7 lb

## 2015-12-27 DIAGNOSIS — C20 Malignant neoplasm of rectum: Secondary | ICD-10-CM

## 2015-12-27 DIAGNOSIS — R911 Solitary pulmonary nodule: Secondary | ICD-10-CM

## 2015-12-27 DIAGNOSIS — Z720 Tobacco use: Secondary | ICD-10-CM

## 2015-12-27 DIAGNOSIS — G894 Chronic pain syndrome: Secondary | ICD-10-CM | POA: Diagnosis not present

## 2015-12-27 MED ORDER — MORPHINE SULFATE ER 60 MG PO TBCR: 60.0000 mg | EXTENDED_RELEASE_TABLET | Freq: Three times a day (TID) | ORAL | Status: DC

## 2015-12-27 MED ORDER — OXYCODONE HCL 10 MG PO TABS: ORAL_TABLET | ORAL | Status: DC
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
## 2015-12-27 NOTE — Assessment & Plan Note (Addendum)
Secondary to LAR for rectal cancer.    On MS Contin 60 mg every 8 hours and Oxycodone 10 mg 1-2 tablets every 4 hours PRN pain #100 with excellent pain control.    Patient also on bowel regimen to maintain BMs and avoid constipation.  Three months worth of Rx for aforementioned pain medication provided at today's visit.    Return in 3 months for follow-up.  He notes some pruritis without any rash on thorax and upper extremities.  He is out of an anti-itch medication that he uses.  He correlates his pruritis with pain medication use.  He is able to try OTC Benadryl at HS too.  No SOB/dyspnea, tongue swelling, etc.

## 2015-12-27 NOTE — Patient Instructions (Signed)
[place] at [facility] Discharge Instructions  RECOMMENDATIONS MADE BY THE CONSULTANT AND ANY TEST RESULTS WILL BE SENT TO YOUR REFERRING PHYSICIAN.  I have refilled your pain medication for 3 months. We will perform some lab work in 3 months. Return in 3 months for follow-up. Call us with any questions or concerns.  Thank you for choosing [place] at [facility] to provide your oncology and hematology care.  To afford each patient quality time with our provider, please arrive at least 15 minutes before your scheduled appointment time.   Beginning January 23rd 2017 lab work for the [facility] will be done in the  Main lab at [facility] on 1st floor. If you have a lab appointment with the [facility] please come in thru the  Main Entrance and check in at the main information desk  You need to re-schedule your appointment should you arrive 10 or more minutes late.  We strive to give you quality time with our providers, and arriving late affects you and other patients whose appointments are after yours.  Also, if you no show three or more times for appointments you may be dismissed from the clinic at the providers discretion.     Again, thank you for choosing [facility].  Our hope is that these requests will decrease the amount of time that you wait before being seen by our physicians.       _____________________________________________________________  Should you have questions after your visit to [facility], please contact our office at [phone] between the hours of 8:30 a.m. and 4:30 p.m.  Voicemails left after 4:30 p.m. will not be returned until the following business day.  For prescription refill requests, have your pharmacy contact our office.         Resources For Cancer Patients and their Caregivers ? American Cancer Society: Can assist with transportation, wigs, general needs, runs Look Good  Feel Better.        815-401-0185 ? Cancer Care: Provides financial assistance, online support groups, medication/co-pay assistance.  1-800-813-HOPE 7823763590) ? [employer] Assists [place] cancer patients and their families through emotional , educational and financial support.  904-204-9822 ? [place] DSS Where to apply for food stamps, Medicaid and utility assistance. 972 631 0551 ? [facility]: Transportation to medical appointments. 808 147 7683 ? Social Security Administration: May apply for disability if have a Stage IV cancer. 7370078805 (705)484-0530 ? [employer], Disability and Transit Services: Assists with nutrition, care and transit needs. 908-329-2774

## 2015-12-27 NOTE — Assessment & Plan Note (Addendum)
NED.    Labs in 3 months: CBC diff, CMET, CEA.  If CEA is normal, will consider discontinuing monitoring of CEA given his Stage I disease.    Return in 3 months for follow-up

## 2016-01-07 DIAGNOSIS — I2510 Atherosclerotic heart disease of native coronary artery without angina pectoris: Secondary | ICD-10-CM | POA: Diagnosis not present

## 2016-01-07 DIAGNOSIS — I10 Essential (primary) hypertension: Secondary | ICD-10-CM | POA: Diagnosis not present

## 2016-01-07 DIAGNOSIS — C20 Malignant neoplasm of rectum: Secondary | ICD-10-CM | POA: Diagnosis not present

## 2016-01-07 DIAGNOSIS — I739 Peripheral vascular disease, unspecified: Secondary | ICD-10-CM | POA: Diagnosis not present

## 2016-01-15 ENCOUNTER — Encounter: Payer: Self-pay | Admitting: Vascular Surgery

## 2016-01-23 ENCOUNTER — Ambulatory Visit (HOSPITAL_COMMUNITY): Payer: No Typology Code available for payment source

## 2016-01-23 ENCOUNTER — Ambulatory Visit: Payer: Commercial Managed Care - HMO | Admitting: Vascular Surgery

## 2016-03-26 ENCOUNTER — Encounter (HOSPITAL_COMMUNITY): Payer: Self-pay | Admitting: Oncology

## 2016-03-26 ENCOUNTER — Ambulatory Visit (HOSPITAL_COMMUNITY): Payer: Commercial Managed Care - HMO | Admitting: Oncology

## 2016-03-26 ENCOUNTER — Encounter (HOSPITAL_COMMUNITY): Payer: Commercial Managed Care - HMO | Attending: Hematology & Oncology

## 2016-03-26 ENCOUNTER — Encounter (HOSPITAL_BASED_OUTPATIENT_CLINIC_OR_DEPARTMENT_OTHER): Payer: Commercial Managed Care - HMO | Admitting: Oncology

## 2016-03-26 VITALS — BP 112/71 | HR 86 | Temp 97.8°F | Resp 16 | Wt 184.9 lb

## 2016-03-26 DIAGNOSIS — Z9889 Other specified postprocedural states: Secondary | ICD-10-CM | POA: Insufficient documentation

## 2016-03-26 DIAGNOSIS — Z809 Family history of malignant neoplasm, unspecified: Secondary | ICD-10-CM | POA: Diagnosis not present

## 2016-03-26 DIAGNOSIS — I10 Essential (primary) hypertension: Secondary | ICD-10-CM | POA: Diagnosis not present

## 2016-03-26 DIAGNOSIS — C20 Malignant neoplasm of rectum: Secondary | ICD-10-CM | POA: Insufficient documentation

## 2016-03-26 DIAGNOSIS — K219 Gastro-esophageal reflux disease without esophagitis: Secondary | ICD-10-CM | POA: Diagnosis not present

## 2016-03-26 DIAGNOSIS — I2510 Atherosclerotic heart disease of native coronary artery without angina pectoris: Secondary | ICD-10-CM | POA: Insufficient documentation

## 2016-03-26 DIAGNOSIS — Z9221 Personal history of antineoplastic chemotherapy: Secondary | ICD-10-CM | POA: Insufficient documentation

## 2016-03-26 DIAGNOSIS — G894 Chronic pain syndrome: Secondary | ICD-10-CM | POA: Diagnosis not present

## 2016-03-26 DIAGNOSIS — R918 Other nonspecific abnormal finding of lung field: Secondary | ICD-10-CM

## 2016-03-26 DIAGNOSIS — I739 Peripheral vascular disease, unspecified: Secondary | ICD-10-CM | POA: Insufficient documentation

## 2016-03-26 DIAGNOSIS — F17210 Nicotine dependence, cigarettes, uncomplicated: Secondary | ICD-10-CM | POA: Insufficient documentation

## 2016-03-26 DIAGNOSIS — I82409 Acute embolism and thrombosis of unspecified deep veins of unspecified lower extremity: Secondary | ICD-10-CM | POA: Insufficient documentation

## 2016-03-26 DIAGNOSIS — Z8249 Family history of ischemic heart disease and other diseases of the circulatory system: Secondary | ICD-10-CM | POA: Insufficient documentation

## 2016-03-26 DIAGNOSIS — Z720 Tobacco use: Secondary | ICD-10-CM

## 2016-03-26 LAB — CBC WITH DIFFERENTIAL/PLATELET
Basophils Absolute: 0 10*3/uL (ref 0.0–0.1)
Basophils Relative: 0 %
Eosinophils Absolute: 0.3 10*3/uL (ref 0.0–0.7)
Eosinophils Relative: 3 %
HCT: 47.7 % (ref 39.0–52.0)
Hemoglobin: 16.5 g/dL (ref 13.0–17.0)
Lymphocytes Relative: 21 %
Lymphs Abs: 1.9 10*3/uL (ref 0.7–4.0)
MCH: 33.7 pg (ref 26.0–34.0)
MCHC: 34.6 g/dL (ref 30.0–36.0)
MCV: 97.5 fL (ref 78.0–100.0)
Monocytes Absolute: 0.9 10*3/uL (ref 0.1–1.0)
Monocytes Relative: 10 %
Neutro Abs: 6 10*3/uL (ref 1.7–7.7)
Neutrophils Relative %: 66 %
Platelets: 186 10*3/uL (ref 150–400)
RBC: 4.89 MIL/uL (ref 4.22–5.81)
RDW: 14.5 % (ref 11.5–15.5)
WBC: 9.1 10*3/uL (ref 4.0–10.5)

## 2016-03-26 LAB — COMPREHENSIVE METABOLIC PANEL
ALT: 14 U/L — ABNORMAL LOW (ref 17–63)
AST: 17 U/L (ref 15–41)
Albumin: 3.7 g/dL (ref 3.5–5.0)
Alkaline Phosphatase: 64 U/L (ref 38–126)
Anion gap: 6 (ref 5–15)
BUN: 14 mg/dL (ref 6–20)
CO2: 26 mmol/L (ref 22–32)
Calcium: 8.9 mg/dL (ref 8.9–10.3)
Chloride: 105 mmol/L (ref 101–111)
Creatinine, Ser: 1.42 mg/dL — ABNORMAL HIGH (ref 0.61–1.24)
GFR calc Af Amer: >60 mL/min (ref >60)
GFR calc non Af Amer: 54 mL/min — ABNORMAL LOW (ref >60)
Glucose, Bld: 109 mg/dL — ABNORMAL HIGH (ref 65–99)
Potassium: 4.5 mmol/L (ref 3.5–5.1)
Sodium: 137 mmol/L (ref 135–145)
Total Bilirubin: 0.6 mg/dL (ref 0.3–1.2)
Total Protein: 7.3 g/dL (ref 6.5–8.1)

## 2016-03-26 MED ORDER — OXYCODONE HCL 10 MG PO TABS: ORAL_TABLET | ORAL | Status: DC

## 2016-03-26 MED ORDER — MORPHINE SULFATE ER 60 MG PO TBCR: 60.0000 mg | EXTENDED_RELEASE_TABLET | Freq: Three times a day (TID) | ORAL | Status: DC

## 2016-03-26 NOTE — Assessment & Plan Note (Addendum)
NED.    Labs today: CBC diff, CMET, CEA.  I personally reviewed and went over laboratory results with the patient.  The results are noted within this dictation.  Return in 3 months for follow-up
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
## 2016-03-26 NOTE — Patient Instructions (Signed)
[place] at [facility] Discharge Instructions  RECOMMENDATIONS MADE BY THE CONSULTANT AND ANY TEST RESULTS WILL BE SENT TO YOUR REFERRING PHYSICIAN.  Labs performed today are good.  Some are pending. You received 3 months worth of pain medications. You will be due for a repeat CT of chest in March 2018. Sleeping aid options include: Benadryl, Tylenol PM, Melatonin, and Valerian Root. Return in 3 months for follow-up.  We will update you on your lab results when they are available.  Thank you for choosing [place] at [facility] to provide your oncology and hematology care.  To afford each patient quality time with our provider, please arrive at least 15 minutes before your scheduled appointment time.   Beginning January 23rd 2017 lab work for the [facility] will be done in the  Main lab at [facility] on 1st floor. If you have a lab appointment with the [facility] please come in thru the  Main Entrance and check in at the main information desk  You need to re-schedule your appointment should you arrive 10 or more minutes late.  We strive to give you quality time with our providers, and arriving late affects you and other patients whose appointments are after yours.  Also, if you no show three or more times for appointments you may be dismissed from the clinic at the providers discretion.     Again, thank you for choosing [facility].  Our hope is that these requests will decrease the amount of time that you wait before being seen by our physicians.       _____________________________________________________________  Should you have questions after your visit to [facility], please contact our office at [phone] between the hours of 8:30 a.m. and 4:30 p.m.  Voicemails left after 4:30 p.m. will not be returned until the following business day.  For prescription refill requests, have your pharmacy contact our  office.         Resources For Cancer Patients and their Caregivers ? American Cancer Society: Can assist with transportation, wigs, general needs, runs Look Good Feel Better.        970-133-8269 ? Cancer Care: Provides financial assistance, online support groups, medication/co-pay assistance.  1-800-813-HOPE 706 330 8688) ? [place] Assists [place] cancer patients and their families through emotional , educational and financial support.  979-696-4660 ? [place] DSS Where to apply for food stamps, Medicaid and utility assistance. (863) 358-7022 ? RCATS: Transportation to medical appointments. (203)508-5896 ? Social Security Administration: May apply for disability if have a Stage IV cancer. (708)886-0056 579-723-4313 ? [employer], Disability and Transit Services: Assists with nutrition, care and transit needs. [place] Support Programs: '@10RELATIVEDAYS'$ @ > Cancer Support Group  2nd Tuesday of the month 1pm-2pm, Journey Room  > Creative Journey  3rd Tuesday of the month 1130am-1pm, Journey Room  > Look Good Feel Better  1st Wednesday of the month 10am-12 noon, Journey Room (Call [place] to register [phone])

## 2016-03-26 NOTE — Assessment & Plan Note (Signed)
Stable.  CT chest in March 2017 is stable.  Another CT of chest is recommended in March 2018.  Order is placed.

## 2016-03-26 NOTE — Assessment & Plan Note (Addendum)
Secondary to LAR for rectal cancer.    On MS Contin 60 mg every 8 hours and Oxycodone 10 mg 1-2 tablets every 4 hours PRN pain #100 with excellent pain control.    Patient also on bowel regimen to maintain BMs and avoid constipation.  He denies any constipation and his bowels are well controlled with stool softeners.  He notes some issues with insomnia the past few nights.  Benadryl has been helpful, but he notes that they are not "in my budget" for this month.  Other recommendations are provided on his discharge instructions.  Three months worth of Rx for aforementioned pain medication provided at today's visit.  I have searched the [place] Controlled Substance Reporting System Website and he does not receive pain medication prescriptions from other providers and he is getting pain medications filled appropriately.  Return in 3 months for follow-up.

## 2016-03-27 LAB — CEA: CEA: 4.8 ng/mL — ABNORMAL HIGH (ref 0.0–4.7)

## 2016-04-02 DIAGNOSIS — H6120 Impacted cerumen, unspecified ear: Secondary | ICD-10-CM | POA: Diagnosis not present

## 2016-04-14 DIAGNOSIS — H6120 Impacted cerumen, unspecified ear: Secondary | ICD-10-CM | POA: Diagnosis not present

## 2016-05-12 ENCOUNTER — Encounter: Payer: Self-pay | Admitting: Vascular Surgery

## 2016-05-14 ENCOUNTER — Ambulatory Visit (HOSPITAL_COMMUNITY): Payer: No Typology Code available for payment source

## 2016-05-14 ENCOUNTER — Ambulatory Visit (HOSPITAL_COMMUNITY): Payer: No Typology Code available for payment source | Attending: Vascular Surgery

## 2016-05-14 ENCOUNTER — Ambulatory Visit: Payer: Commercial Managed Care - HMO | Admitting: Vascular Surgery

## 2016-06-02 ENCOUNTER — Encounter (HOSPITAL_COMMUNITY): Payer: Self-pay | Admitting: Oncology

## 2016-06-02 ENCOUNTER — Encounter (HOSPITAL_COMMUNITY): Payer: Commercial Managed Care - HMO | Attending: Hematology & Oncology | Admitting: Oncology

## 2016-06-02 ENCOUNTER — Encounter (HOSPITAL_COMMUNITY): Payer: Commercial Managed Care - HMO

## 2016-06-02 VITALS — BP 142/90 | HR 118 | Temp 98.0°F | Resp 20 | Wt 179.2 lb

## 2016-06-02 DIAGNOSIS — F17210 Nicotine dependence, cigarettes, uncomplicated: Secondary | ICD-10-CM | POA: Insufficient documentation

## 2016-06-02 DIAGNOSIS — R918 Other nonspecific abnormal finding of lung field: Secondary | ICD-10-CM | POA: Diagnosis not present

## 2016-06-02 DIAGNOSIS — Z809 Family history of malignant neoplasm, unspecified: Secondary | ICD-10-CM | POA: Insufficient documentation

## 2016-06-02 DIAGNOSIS — K219 Gastro-esophageal reflux disease without esophagitis: Secondary | ICD-10-CM | POA: Diagnosis not present

## 2016-06-02 DIAGNOSIS — K6289 Other specified diseases of anus and rectum: Secondary | ICD-10-CM

## 2016-06-02 DIAGNOSIS — G894 Chronic pain syndrome: Secondary | ICD-10-CM | POA: Diagnosis not present

## 2016-06-02 DIAGNOSIS — I82409 Acute embolism and thrombosis of unspecified deep veins of unspecified lower extremity: Secondary | ICD-10-CM | POA: Insufficient documentation

## 2016-06-02 DIAGNOSIS — Z9889 Other specified postprocedural states: Secondary | ICD-10-CM | POA: Insufficient documentation

## 2016-06-02 DIAGNOSIS — Z8249 Family history of ischemic heart disease and other diseases of the circulatory system: Secondary | ICD-10-CM | POA: Insufficient documentation

## 2016-06-02 DIAGNOSIS — I10 Essential (primary) hypertension: Secondary | ICD-10-CM | POA: Insufficient documentation

## 2016-06-02 DIAGNOSIS — I739 Peripheral vascular disease, unspecified: Secondary | ICD-10-CM | POA: Insufficient documentation

## 2016-06-02 DIAGNOSIS — I2510 Atherosclerotic heart disease of native coronary artery without angina pectoris: Secondary | ICD-10-CM | POA: Insufficient documentation

## 2016-06-02 DIAGNOSIS — C20 Malignant neoplasm of rectum: Secondary | ICD-10-CM | POA: Insufficient documentation

## 2016-06-02 DIAGNOSIS — Z9221 Personal history of antineoplastic chemotherapy: Secondary | ICD-10-CM | POA: Diagnosis not present

## 2016-06-02 LAB — COMPREHENSIVE METABOLIC PANEL
ALT: 13 U/L — ABNORMAL LOW (ref 17–63)
AST: 16 U/L (ref 15–41)
Albumin: 3.5 g/dL (ref 3.5–5.0)
Alkaline Phosphatase: 70 U/L (ref 38–126)
Anion gap: 8 (ref 5–15)
BUN: 14 mg/dL (ref 6–20)
CO2: 26 mmol/L (ref 22–32)
Calcium: 9 mg/dL (ref 8.9–10.3)
Chloride: 105 mmol/L (ref 101–111)
Creatinine, Ser: 1.48 mg/dL — ABNORMAL HIGH (ref 0.61–1.24)
GFR calc Af Amer: 59 mL/min — ABNORMAL LOW (ref >60)
GFR calc non Af Amer: 51 mL/min — ABNORMAL LOW (ref >60)
Glucose, Bld: 121 mg/dL — ABNORMAL HIGH (ref 65–99)
Potassium: 4.3 mmol/L (ref 3.5–5.1)
Sodium: 139 mmol/L (ref 135–145)
Total Bilirubin: 1.3 mg/dL — ABNORMAL HIGH (ref 0.3–1.2)
Total Protein: 7.7 g/dL (ref 6.5–8.1)

## 2016-06-02 LAB — CBC WITH DIFFERENTIAL/PLATELET
Basophils Absolute: 0 10*3/uL (ref 0.0–0.1)
Basophils Relative: 0 %
Eosinophils Absolute: 0.1 10*3/uL (ref 0.0–0.7)
Eosinophils Relative: 1 %
HCT: 50.2 % (ref 39.0–52.0)
Hemoglobin: 17.7 g/dL — ABNORMAL HIGH (ref 13.0–17.0)
Lymphocytes Relative: 8 %
Lymphs Abs: 1.6 10*3/uL (ref 0.7–4.0)
MCH: 34.2 pg — ABNORMAL HIGH (ref 26.0–34.0)
MCHC: 35.3 g/dL (ref 30.0–36.0)
MCV: 97.1 fL (ref 78.0–100.0)
Monocytes Absolute: 1.9 10*3/uL — ABNORMAL HIGH (ref 0.1–1.0)
Monocytes Relative: 9 %
Neutro Abs: 17.4 10*3/uL — ABNORMAL HIGH (ref 1.7–7.7)
Neutrophils Relative %: 82 %
Platelets: 206 10*3/uL (ref 150–400)
RBC: 5.17 MIL/uL (ref 4.22–5.81)
RDW: 14.3 % (ref 11.5–15.5)
WBC: 21 10*3/uL — ABNORMAL HIGH (ref 4.0–10.5)

## 2016-06-02 MED ORDER — SULFAMETHOXAZOLE-TRIMETHOPRIM 800-160 MG PO TABS: 1.0000 | ORAL_TABLET | Freq: Two times a day (BID) | ORAL | 0 refills | Status: DC

## 2016-06-02 MED ORDER — OXYCODONE HCL 10 MG PO TABS: ORAL_TABLET | ORAL | 0 refills | Status: DC

## 2016-06-02 NOTE — Patient Instructions (Signed)
[place] at [facility] Discharge Instructions  RECOMMENDATIONS MADE BY THE CONSULTANT AND ANY TEST RESULTS WILL BE SENT TO YOUR REFERRING PHYSICIAN.  You were seen by Gershon Mussel today Refill and prescription given Referral to GI  Thank you for choosing [place] at [facility] to provide your oncology and hematology care.  To afford each patient quality time with our provider, please arrive at least 15 minutes before your scheduled appointment time.   Beginning January 23rd 2017 lab work for the [facility] will be done in the  Main lab at [facility] on 1st floor. If you have a lab appointment with the [facility] please come in thru the  Main Entrance and check in at the main information desk  You need to re-schedule your appointment should you arrive 10 or more minutes late.  We strive to give you quality time with our providers, and arriving late affects you and other patients whose appointments are after yours.  Also, if you no show three or more times for appointments you may be dismissed from the clinic at the providers discretion.     Again, thank you for choosing [facility].  Our hope is that these requests will decrease the amount of time that you wait before being seen by our physicians.       _____________________________________________________________  Should you have questions after your visit to [facility], please contact our office at [phone] between the hours of 8:30 a.m. and 4:30 p.m.  Voicemails left after 4:30 p.m. will not be returned until the following business day.  For prescription refill requests, have your pharmacy contact our office.         Resources For Cancer Patients and their Caregivers ? American Cancer Society: Can assist with transportation, wigs, general needs, runs Look Good Feel Better.        ([phone] 053-4658 ? Cancer Care: Provides financial assistance, online  support groups, medication/co-pay assistance.  1-800-813-HOPE 225-021-7170) ? [place] Assists [place] cancer patients and their families through emotional , educational and financial support.  (505)866-6754 ? [place] DSS Where to apply for food stamps, Medicaid and utility assistance. 646-661-9899 ? RCATS: Transportation to medical appointments. (205)770-9944 ? Social Security Administration: May apply for disability if have a Stage IV cancer. 430-713-3772 5083700589 ? [employer], Disability and Transit Services: Assists with nutrition, care and transit needs. [place] Support Programs: '@10RELATIVEDAYS'$ @ > Cancer Support Group  2nd Tuesday of the month 1pm-2pm, Journey Room  > Creative Journey  3rd Tuesday of the month 1130am-1pm, Journey Room  > Look Good Feel Better  1st Wednesday of the month 10am-12 noon, Journey Room (Call [place] to register [phone])

## 2016-06-02 NOTE — Progress Notes (Signed)
Patient is seen as an acute work-in today when he reported to the clinic unannounced complaining of rectal pain.  He reports rectal discomfort out of the ordinary x 1 months.  He notes weight loss but when chart is reviewed, weight is stable.  He notes a change in bowel habit resulting is loose stools that is abnormal for him.  He denies any blood in stool or dark stool.  He notes some fevers at home but he cannot provide me any definitive numbers.  He reports that the pain is so bad that he lays in bed most of the time.    He denies any cough or urinary complaints.   He is concerned that his cancer is back.  He has not seen [name] in years.  Vitals:   06/02/16 0900  BP: (!) 142/90  Pulse: (!) 118  Resp: 20  Temp: 98 F (36.7 C)   Gen: Visible discomfort, bent over exam table.  Unaccompanied HEENT: Atraumatic, normocephalic. Skin: Warm and dry Rectal: pain with spreading of buttocks for visualization.  No obvious external hemorrhoid or fissure.  No erythema.  No perianal/perineal abnormality.  No scrotal changes.   Assessment: 1. Rectal pain with changes in bowel habits.  H/O rectal cancer treated definitively by [name] at [facility] in 2012. 2. Fevers at home- none documented.  Plan: 1. Rx for Bactrim x7 days for possible prostatitis based on symptoms. 2. Rx for short course of Oxycodone. 3. Labs today: CBC diff, CMET, CEA 4. Stool cards x 3 to evaluate for occult blood. 5. He will call on Thursday if no better.  At that time, he will return as a work-in for rectal exam. 6. Refer to GI (rectal pain, change in bowel habits, no blood, H/O rectal cancer).  Patient and plan discussed with Dr. Ancil Linsey and she is in agreement with the aforementioned.   Robynn Pane, [name] 06/02/2016 9:36 AM

## 2016-06-03 ENCOUNTER — Telehealth (HOSPITAL_COMMUNITY): Payer: Self-pay | Admitting: *Deleted

## 2016-06-03 LAB — CEA: CEA: 4.3 ng/mL (ref 0.0–4.7)

## 2016-06-03 NOTE — Telephone Encounter (Signed)
-----   Message from [name], [name] sent at 06/02/2016  4:36 PM EDT ----- Stable.  CEA is pending.

## 2016-06-03 NOTE — Telephone Encounter (Signed)
Pt aware of stable labs. Pt states that he doesn't feel any better. Pt states that now he has blood in his urine when he pees. Pt states that he is going to he regular Dr. Marylene Buerger. I advised the pt to call us and lets [place] know what his Dr. Michela Pitcher tomorrow so we can get him worked in if needed. Pt verbalized understanding.

## 2016-06-04 DIAGNOSIS — C20 Malignant neoplasm of rectum: Secondary | ICD-10-CM | POA: Diagnosis not present

## 2016-06-04 DIAGNOSIS — K611 Rectal abscess: Secondary | ICD-10-CM | POA: Diagnosis not present

## 2016-06-05 ENCOUNTER — Other Ambulatory Visit: Payer: Self-pay

## 2016-06-05 ENCOUNTER — Encounter: Payer: Self-pay | Admitting: Cardiovascular Disease

## 2016-06-05 ENCOUNTER — Emergency Department (HOSPITAL_COMMUNITY): Payer: Commercial Managed Care - HMO | Admitting: Anesthesiology

## 2016-06-05 ENCOUNTER — Emergency Department (HOSPITAL_COMMUNITY): Payer: Commercial Managed Care - HMO

## 2016-06-05 ENCOUNTER — Encounter (HOSPITAL_COMMUNITY): Payer: Self-pay | Admitting: Emergency Medicine

## 2016-06-05 ENCOUNTER — Ambulatory Visit: Payer: Commercial Managed Care - HMO | Admitting: Cardiovascular Disease

## 2016-06-05 ENCOUNTER — Inpatient Hospital Stay (HOSPITAL_COMMUNITY)
Admission: EM | Admit: 2016-06-05 | Discharge: 2016-06-16 | DRG: 853 | Disposition: A | Discharge status: Skilled Nursing Facility | Payer: Commercial Managed Care - HMO | Attending: Internal Medicine | Admitting: Internal Medicine

## 2016-06-05 ENCOUNTER — Encounter (HOSPITAL_COMMUNITY)
Admission: EM | Disposition: A | Discharge status: Skilled Nursing Facility | Payer: Self-pay | Source: Home / Self Care | Attending: Internal Medicine

## 2016-06-05 ENCOUNTER — Inpatient Hospital Stay (HOSPITAL_COMMUNITY): Payer: Commercial Managed Care - HMO

## 2016-06-05 DIAGNOSIS — Z7982 Long term (current) use of aspirin: Secondary | ICD-10-CM

## 2016-06-05 DIAGNOSIS — I10 Essential (primary) hypertension: Secondary | ICD-10-CM | POA: Diagnosis not present

## 2016-06-05 DIAGNOSIS — K219 Gastro-esophageal reflux disease without esophagitis: Secondary | ICD-10-CM | POA: Diagnosis present

## 2016-06-05 DIAGNOSIS — I255 Ischemic cardiomyopathy: Secondary | ICD-10-CM | POA: Diagnosis present

## 2016-06-05 DIAGNOSIS — I4892 Unspecified atrial flutter: Secondary | ICD-10-CM | POA: Diagnosis not present

## 2016-06-05 DIAGNOSIS — Z955 Presence of coronary angioplasty implant and graft: Secondary | ICD-10-CM

## 2016-06-05 DIAGNOSIS — I2510 Atherosclerotic heart disease of native coronary artery without angina pectoris: Secondary | ICD-10-CM | POA: Diagnosis present

## 2016-06-05 DIAGNOSIS — J9601 Acute respiratory failure with hypoxia: Secondary | ICD-10-CM | POA: Diagnosis not present

## 2016-06-05 DIAGNOSIS — I739 Peripheral vascular disease, unspecified: Secondary | ICD-10-CM | POA: Diagnosis present

## 2016-06-05 DIAGNOSIS — Z79891 Long term (current) use of opiate analgesic: Secondary | ICD-10-CM

## 2016-06-05 DIAGNOSIS — Z789 Other specified health status: Secondary | ICD-10-CM

## 2016-06-05 DIAGNOSIS — F17210 Nicotine dependence, cigarettes, uncomplicated: Secondary | ICD-10-CM | POA: Diagnosis present

## 2016-06-05 DIAGNOSIS — R6521 Severe sepsis with septic shock: Secondary | ICD-10-CM | POA: Diagnosis present

## 2016-06-05 DIAGNOSIS — G4733 Obstructive sleep apnea (adult) (pediatric): Secondary | ICD-10-CM | POA: Diagnosis present

## 2016-06-05 DIAGNOSIS — L0231 Cutaneous abscess of buttock: Secondary | ICD-10-CM | POA: Diagnosis not present

## 2016-06-05 DIAGNOSIS — J9811 Atelectasis: Secondary | ICD-10-CM | POA: Diagnosis not present

## 2016-06-05 DIAGNOSIS — R7302 Impaired glucose tolerance (oral): Secondary | ICD-10-CM | POA: Diagnosis not present

## 2016-06-05 DIAGNOSIS — E785 Hyperlipidemia, unspecified: Secondary | ICD-10-CM | POA: Diagnosis not present

## 2016-06-05 DIAGNOSIS — J9600 Acute respiratory failure, unspecified whether with hypoxia or hypercapnia: Secondary | ICD-10-CM

## 2016-06-05 DIAGNOSIS — J690 Pneumonitis due to inhalation of food and vomit: Secondary | ICD-10-CM | POA: Diagnosis present

## 2016-06-05 DIAGNOSIS — Z85048 Personal history of other malignant neoplasm of rectum, rectosigmoid junction, and anus: Secondary | ICD-10-CM | POA: Diagnosis not present

## 2016-06-05 DIAGNOSIS — R6520 Severe sepsis without septic shock: Secondary | ICD-10-CM | POA: Diagnosis not present

## 2016-06-05 DIAGNOSIS — F1920 Other psychoactive substance dependence, uncomplicated: Secondary | ICD-10-CM | POA: Diagnosis not present

## 2016-06-05 DIAGNOSIS — R0602 Shortness of breath: Secondary | ICD-10-CM | POA: Diagnosis not present

## 2016-06-05 DIAGNOSIS — N493 Fournier gangrene: Secondary | ICD-10-CM | POA: Diagnosis present

## 2016-06-05 DIAGNOSIS — R918 Other nonspecific abnormal finding of lung field: Secondary | ICD-10-CM | POA: Diagnosis not present

## 2016-06-05 DIAGNOSIS — L03317 Cellulitis of buttock: Secondary | ICD-10-CM | POA: Diagnosis not present

## 2016-06-05 DIAGNOSIS — I480 Paroxysmal atrial fibrillation: Secondary | ICD-10-CM | POA: Diagnosis not present

## 2016-06-05 DIAGNOSIS — N179 Acute kidney failure, unspecified: Secondary | ICD-10-CM | POA: Diagnosis not present

## 2016-06-05 DIAGNOSIS — K604 Rectal fistula: Secondary | ICD-10-CM | POA: Diagnosis not present

## 2016-06-05 DIAGNOSIS — K660 Peritoneal adhesions (postprocedural) (postinfection): Secondary | ICD-10-CM | POA: Diagnosis present

## 2016-06-05 DIAGNOSIS — L0291 Cutaneous abscess, unspecified: Secondary | ICD-10-CM | POA: Diagnosis present

## 2016-06-05 DIAGNOSIS — M545 Low back pain: Secondary | ICD-10-CM | POA: Diagnosis not present

## 2016-06-05 DIAGNOSIS — Z86718 Personal history of other venous thrombosis and embolism: Secondary | ICD-10-CM

## 2016-06-05 DIAGNOSIS — C20 Malignant neoplasm of rectum: Secondary | ICD-10-CM

## 2016-06-05 DIAGNOSIS — Z79899 Other long term (current) drug therapy: Secondary | ICD-10-CM | POA: Diagnosis not present

## 2016-06-05 DIAGNOSIS — S37009A Unspecified injury of unspecified kidney, initial encounter: Secondary | ICD-10-CM | POA: Diagnosis not present

## 2016-06-05 DIAGNOSIS — T85511A Breakdown (mechanical) of esophageal anti-reflux device, initial encounter: Secondary | ICD-10-CM | POA: Diagnosis not present

## 2016-06-05 DIAGNOSIS — M6281 Muscle weakness (generalized): Secondary | ICD-10-CM | POA: Diagnosis not present

## 2016-06-05 DIAGNOSIS — E46 Unspecified protein-calorie malnutrition: Secondary | ICD-10-CM | POA: Diagnosis present

## 2016-06-05 DIAGNOSIS — A419 Sepsis, unspecified organism: Principal | ICD-10-CM | POA: Diagnosis present

## 2016-06-05 DIAGNOSIS — G894 Chronic pain syndrome: Secondary | ICD-10-CM | POA: Diagnosis not present

## 2016-06-05 DIAGNOSIS — M726 Necrotizing fasciitis: Secondary | ICD-10-CM | POA: Diagnosis not present

## 2016-06-05 DIAGNOSIS — I4891 Unspecified atrial fibrillation: Secondary | ICD-10-CM | POA: Diagnosis not present

## 2016-06-05 DIAGNOSIS — Z9221 Personal history of antineoplastic chemotherapy: Secondary | ICD-10-CM

## 2016-06-05 DIAGNOSIS — M533 Sacrococcygeal disorders, not elsewhere classified: Secondary | ICD-10-CM | POA: Diagnosis present

## 2016-06-05 DIAGNOSIS — Z452 Encounter for adjustment and management of vascular access device: Secondary | ICD-10-CM | POA: Diagnosis not present

## 2016-06-05 DIAGNOSIS — K625 Hemorrhage of anus and rectum: Secondary | ICD-10-CM | POA: Diagnosis not present

## 2016-06-05 DIAGNOSIS — C801 Malignant (primary) neoplasm, unspecified: Secondary | ICD-10-CM | POA: Diagnosis not present

## 2016-06-05 DIAGNOSIS — M729 Fibroblastic disorder, unspecified: Secondary | ICD-10-CM | POA: Diagnosis not present

## 2016-06-05 HISTORY — PX: INCISION AND DRAINAGE ABSCESS: SHX5864

## 2016-06-05 LAB — POCT I-STAT 3, ART BLOOD GAS (G3+)
Acid-base deficit: 9 mmol/L — ABNORMAL HIGH (ref 0.0–2.0)
Acid-base deficit: 4 mmol/L — ABNORMAL HIGH (ref 0.0–2.0)
Bicarbonate: 19.8 mmol/L — ABNORMAL LOW (ref 20.0–28.0)
Bicarbonate: 22.7 mmol/L (ref 20.0–28.0)
O2 Saturation: 89 %
O2 Saturation: 99 %
Patient temperature: 98.6
Patient temperature: 98.6
TCO2: 21 mmol/L (ref 0–100)
TCO2: 24 mmol/L (ref 0–100)
pCO2 arterial: 50.7 mmHg — ABNORMAL HIGH (ref 32.0–48.0)
pCO2 arterial: 47.8 mmHg (ref 32.0–48.0)
pH, Arterial: 7.2 — ABNORMAL LOW (ref 7.350–7.450)
pH, Arterial: 7.285 — ABNORMAL LOW (ref 7.350–7.450)
pO2, Arterial: 69 mmHg — ABNORMAL LOW (ref 83.0–108.0)
pO2, Arterial: 161 mmHg — ABNORMAL HIGH (ref 83.0–108.0)

## 2016-06-05 LAB — GLUCOSE, CAPILLARY
Glucose-Capillary: 134 mg/dL — ABNORMAL HIGH (ref 65–99)
Glucose-Capillary: 113 mg/dL — ABNORMAL HIGH (ref 65–99)

## 2016-06-05 LAB — COMPREHENSIVE METABOLIC PANEL
ALT: 26 U/L (ref 17–63)
AST: 40 U/L (ref 15–41)
Albumin: 2.8 g/dL — ABNORMAL LOW (ref 3.5–5.0)
Alkaline Phosphatase: 73 U/L (ref 38–126)
Anion gap: 9 (ref 5–15)
BUN: 22 mg/dL — ABNORMAL HIGH (ref 6–20)
CO2: 22 mmol/L (ref 22–32)
Calcium: 8.4 mg/dL — ABNORMAL LOW (ref 8.9–10.3)
Chloride: 100 mmol/L — ABNORMAL LOW (ref 101–111)
Creatinine, Ser: 2.08 mg/dL — ABNORMAL HIGH (ref 0.61–1.24)
GFR calc Af Amer: 39 mL/min — ABNORMAL LOW (ref >60)
GFR calc non Af Amer: 34 mL/min — ABNORMAL LOW (ref >60)
Glucose, Bld: 136 mg/dL — ABNORMAL HIGH (ref 65–99)
Potassium: 4.3 mmol/L (ref 3.5–5.1)
Sodium: 131 mmol/L — ABNORMAL LOW (ref 135–145)
Total Bilirubin: 1.4 mg/dL — ABNORMAL HIGH (ref 0.3–1.2)
Total Protein: 7 g/dL (ref 6.5–8.1)

## 2016-06-05 LAB — CBC WITH DIFFERENTIAL/PLATELET
Basophils Absolute: 0 10*3/uL (ref 0.0–0.1)
Basophils Relative: 0 %
Eosinophils Absolute: 0 10*3/uL (ref 0.0–0.7)
Eosinophils Relative: 0 %
HCT: 44.1 % (ref 39.0–52.0)
Hemoglobin: 15.7 g/dL (ref 13.0–17.0)
Lymphocytes Relative: 5 %
Lymphs Abs: 1 10*3/uL (ref 0.7–4.0)
MCH: 33.3 pg (ref 26.0–34.0)
MCHC: 35.6 g/dL (ref 30.0–36.0)
MCV: 93.4 fL (ref 78.0–100.0)
Monocytes Absolute: 3.1 10*3/uL — ABNORMAL HIGH (ref 0.1–1.0)
Monocytes Relative: 15 %
Neutro Abs: 16.6 10*3/uL — ABNORMAL HIGH (ref 1.7–7.7)
Neutrophils Relative %: 80 %
Platelets: 183 10*3/uL (ref 150–400)
RBC: 4.72 MIL/uL (ref 4.22–5.81)
RDW: 14.2 % (ref 11.5–15.5)
WBC Morphology: INCREASED
WBC: 20.7 10*3/uL — ABNORMAL HIGH (ref 4.0–10.5)

## 2016-06-05 LAB — I-STAT CHEM 8, ED
BUN: 21 mg/dL — ABNORMAL HIGH (ref 6–20)
Calcium, Ion: 1.1 mmol/L — ABNORMAL LOW (ref 1.15–1.40)
Chloride: 98 mmol/L — ABNORMAL LOW (ref 101–111)
Creatinine, Ser: 2 mg/dL — ABNORMAL HIGH (ref 0.61–1.24)
Glucose, Bld: 136 mg/dL — ABNORMAL HIGH (ref 65–99)
HCT: 50 % (ref 39.0–52.0)
Hemoglobin: 17 g/dL (ref 13.0–17.0)
Potassium: 4.3 mmol/L (ref 3.5–5.1)
Sodium: 133 mmol/L — ABNORMAL LOW (ref 135–145)
TCO2: 21 mmol/L (ref 0–100)

## 2016-06-05 LAB — I-STAT CG4 LACTIC ACID, ED: Lactic Acid, Venous: 2.57 mmol/L (ref 0.5–1.9)

## 2016-06-05 LAB — POC OCCULT BLOOD, ED: Fecal Occult Bld: NEGATIVE

## 2016-06-05 LAB — PROCALCITONIN
Procalcitonin: 1.06 ng/mL
Procalcitonin: 1.7 ng/mL

## 2016-06-05 LAB — LACTIC ACID, PLASMA: Lactic Acid, Venous: 1.2 mmol/L (ref 0.5–1.9)

## 2016-06-05 SURGERY — INCISION AND DRAINAGE, ABSCESS
Anesthesia: General | Site: Buttocks | Laterality: Left

## 2016-06-05 MED ORDER — SODIUM CHLORIDE 0.9 % IV SOLN
INTRAVENOUS | Status: DC
  Administered 2016-06-05 – 2016-06-08 (×6): via INTRAVENOUS

## 2016-06-05 MED ORDER — MIDAZOLAM HCL 2 MG/2ML IJ SOLN
1.0000 mg | INTRAMUSCULAR | Status: DC | PRN
  Administered 2016-06-05 (×4): 2 mg via INTRAVENOUS
  Filled 2016-06-05 (×3): qty 2

## 2016-06-05 MED ORDER — FENTANYL BOLUS VIA INFUSION
30.0000 ug | INTRAVENOUS | Status: DC | PRN
  Filled 2016-06-05: qty 30

## 2016-06-05 MED ORDER — CLINDAMYCIN PHOSPHATE 600 MG/50ML IV SOLN
600.0000 mg | Freq: Four times a day (QID) | INTRAVENOUS | Status: DC
  Administered 2016-06-05 – 2016-06-08 (×11): 600 mg via INTRAVENOUS
  Filled 2016-06-05 (×13): qty 50

## 2016-06-05 MED ORDER — SODIUM CHLORIDE 0.9 % IV BOLUS (SEPSIS)
1000.0000 mL | Freq: Once | INTRAVENOUS | Status: AC
  Administered 2016-06-05: 1000 mL via INTRAVENOUS

## 2016-06-05 MED ORDER — CHLORHEXIDINE GLUCONATE CLOTH 2 % EX PADS: 6.0000 | MEDICATED_PAD | Freq: Once | CUTANEOUS | Status: DC

## 2016-06-05 MED ORDER — PIPERACILLIN-TAZOBACTAM 3.375 G IVPB
3.3750 g | Freq: Three times a day (TID) | INTRAVENOUS | Status: DC
  Administered 2016-06-05 – 2016-06-16 (×31): 3.375 g via INTRAVENOUS
  Filled 2016-06-05 (×39): qty 50

## 2016-06-05 MED ORDER — PIPERACILLIN-TAZOBACTAM 3.375 G IVPB 30 MIN
3.3750 g | Freq: Once | INTRAVENOUS | Status: AC
  Administered 2016-06-05: 3.375 g via INTRAVENOUS
  Filled 2016-06-05: qty 50

## 2016-06-05 MED ORDER — LIDOCAINE HCL (CARDIAC) 20 MG/ML IV SOLN
INTRAVENOUS | Status: DC | PRN
  Administered 2016-06-05: 30 mg via INTRAVENOUS

## 2016-06-05 MED ORDER — MIDAZOLAM HCL 2 MG/2ML IJ SOLN
INTRAMUSCULAR | Status: AC
  Filled 2016-06-05: qty 2

## 2016-06-05 MED ORDER — ORAL CARE MOUTH RINSE
15.0000 mL | Freq: Four times a day (QID) | OROMUCOSAL | Status: DC
  Administered 2016-06-05 – 2016-06-07 (×6): 15 mL via OROMUCOSAL

## 2016-06-05 MED ORDER — LORAZEPAM 2 MG/ML IJ SOLN
INTRAMUSCULAR | Status: AC
Start: 2016-06-05 — End: 2016-06-05
  Filled 2016-06-05: qty 1

## 2016-06-05 MED ORDER — DOCUSATE SODIUM 50 MG/5ML PO LIQD: 100.0000 mg | Freq: Two times a day (BID) | ORAL | Status: DC | PRN

## 2016-06-05 MED ORDER — CLINDAMYCIN PHOSPHATE 600 MG/50ML IV SOLN
600.0000 mg | Freq: Once | INTRAVENOUS | Status: AC
  Administered 2016-06-05: 600 mg via INTRAVENOUS
  Filled 2016-06-05: qty 50

## 2016-06-05 MED ORDER — FENTANYL CITRATE (PF) 100 MCG/2ML IJ SOLN
INTRAMUSCULAR | Status: AC
  Filled 2016-06-05: qty 2

## 2016-06-05 MED ORDER — NOREPINEPHRINE BITARTRATE 1 MG/ML IV SOLN
0.0000 ug/min | INTRAVENOUS | Status: DC
  Administered 2016-06-05: 2 ug/min via INTRAVENOUS
  Filled 2016-06-05: qty 4

## 2016-06-05 MED ORDER — FENTANYL CITRATE (PF) 100 MCG/2ML IJ SOLN
50.0000 ug | Freq: Once | INTRAMUSCULAR | Status: AC
  Administered 2016-06-05: 50 ug via INTRAVENOUS
  Filled 2016-06-05: qty 2

## 2016-06-05 MED ORDER — VANCOMYCIN HCL IN DEXTROSE 1-5 GM/200ML-% IV SOLN
1000.0000 mg | Freq: Once | INTRAVENOUS | Status: AC
  Administered 2016-06-05: 1000 mg via INTRAVENOUS
  Filled 2016-06-05: qty 200

## 2016-06-05 MED ORDER — ONDANSETRON HCL 4 MG/2ML IJ SOLN
4.0000 mg | Freq: Once | INTRAMUSCULAR | Status: AC
  Administered 2016-06-05: 4 mg via INTRAVENOUS
  Filled 2016-06-05: qty 2

## 2016-06-05 MED ORDER — IPRATROPIUM BROMIDE 0.02 % IN SOLN: 0.5000 mg | RESPIRATORY_TRACT | Status: DC | PRN

## 2016-06-05 MED ORDER — MIDAZOLAM HCL 2 MG/2ML IJ SOLN: 2.0000 mg | INTRAMUSCULAR | Status: DC | PRN

## 2016-06-05 MED ORDER — FENTANYL CITRATE (PF) 250 MCG/5ML IJ SOLN
INTRAMUSCULAR | Status: AC
  Filled 2016-06-05: qty 5

## 2016-06-05 MED ORDER — MIDAZOLAM HCL 2 MG/2ML IJ SOLN
INTRAMUSCULAR | Status: DC | PRN
  Administered 2016-06-05: 2 mg via INTRAVENOUS

## 2016-06-05 MED ORDER — ETOMIDATE 2 MG/ML IV SOLN
INTRAVENOUS | Status: AC
  Filled 2016-06-05: qty 10

## 2016-06-05 MED ORDER — SODIUM CHLORIDE 0.9 % IV SOLN
INTRAVENOUS | Status: AC
Start: 2016-06-05 — End: 2016-06-05

## 2016-06-05 MED ORDER — FAMOTIDINE 40 MG/5ML PO SUSR
20.0000 mg | Freq: Two times a day (BID) | ORAL | Status: AC
  Administered 2016-06-05 – 2016-06-10 (×10): 20 mg
  Filled 2016-06-05 (×12): qty 2.5

## 2016-06-05 MED ORDER — ROCURONIUM BROMIDE 50 MG/5ML IV SOLN
30.0000 mg | Freq: Once | INTRAVENOUS | Status: AC
  Administered 2016-06-05: 30 mg via INTRAVENOUS

## 2016-06-05 MED ORDER — DILTIAZEM HCL 25 MG/5ML IV SOLN: 5.0000 mg | Freq: Once | INTRAVENOUS | Status: DC

## 2016-06-05 MED ORDER — SODIUM CHLORIDE 0.9 % IV SOLN: 250.0000 mL | INTRAVENOUS | Status: DC | PRN

## 2016-06-05 MED ORDER — FENTANYL CITRATE (PF) 100 MCG/2ML IJ SOLN
50.0000 ug | INTRAMUSCULAR | Status: DC | PRN
  Administered 2016-06-05: 50 ug via INTRAVENOUS

## 2016-06-05 MED ORDER — NOREPINEPHRINE BITARTRATE 1 MG/ML IV SOLN
0.0000 ug/min | INTRAVENOUS | Status: DC
  Administered 2016-06-06: 7 ug/min via INTRAVENOUS
  Filled 2016-06-05: qty 16

## 2016-06-05 MED ORDER — PHENYLEPHRINE HCL 10 MG/ML IJ SOLN
INTRAMUSCULAR | Status: DC | PRN
  Administered 2016-06-05 (×2): 40 ug via INTRAVENOUS
  Administered 2016-06-05: 80 ug via INTRAVENOUS
  Administered 2016-06-05 (×2): 40 ug via INTRAVENOUS

## 2016-06-05 MED ORDER — LACTATED RINGERS IV SOLN
INTRAVENOUS | Status: DC
  Administered 2016-06-05: 16:00:00 via INTRAVENOUS

## 2016-06-05 MED ORDER — DEXTROSE 5 % IV SOLN
INTRAVENOUS | Status: DC | PRN
  Administered 2016-06-05: 2 ug/kg/min via INTRAVENOUS

## 2016-06-05 MED ORDER — LACTATED RINGERS IV SOLN
INTRAVENOUS | Status: DC | PRN
  Administered 2016-06-05: 10:00:00 via INTRAVENOUS

## 2016-06-05 MED ORDER — SODIUM CHLORIDE 0.9 % IV SOLN: INTRAVENOUS | Status: DC

## 2016-06-05 MED ORDER — ROCURONIUM 10MG/ML (10ML) SYRINGE FOR MEDFUSION PUMP - OPTIME
INTRAVENOUS | Status: DC | PRN
  Administered 2016-06-05: 5 mg via INTRAVENOUS

## 2016-06-05 MED ORDER — ETOMIDATE 2 MG/ML IV SOLN
INTRAVENOUS | Status: DC | PRN
  Administered 2016-06-05: 16 mg via INTRAVENOUS

## 2016-06-05 MED ORDER — SODIUM CHLORIDE 0.9 % IV SOLN
25.0000 ug/h | INTRAVENOUS | Status: DC
  Administered 2016-06-05: 50 ug/h via INTRAVENOUS
  Administered 2016-06-06: 150 ug/h via INTRAVENOUS
  Filled 2016-06-05 (×2): qty 50

## 2016-06-05 MED ORDER — PHENYLEPHRINE 40 MCG/ML (10ML) SYRINGE FOR IV PUSH (FOR BLOOD PRESSURE SUPPORT)
PREFILLED_SYRINGE | INTRAVENOUS | Status: AC
  Filled 2016-06-05: qty 10

## 2016-06-05 MED ORDER — FENTANYL CITRATE (PF) 100 MCG/2ML IJ SOLN: 100.0000 ug | INTRAMUSCULAR | Status: DC | PRN

## 2016-06-05 MED ORDER — LORAZEPAM 2 MG/ML IJ SOLN
1.0000 mg | Freq: Once | INTRAMUSCULAR | Status: AC
  Administered 2016-06-05: 1 mg via INTRAVENOUS

## 2016-06-05 MED ORDER — PIPERACILLIN-TAZOBACTAM 3.375 G IVPB
3.3750 g | Freq: Three times a day (TID) | INTRAVENOUS | Status: DC
  Filled 2016-06-05 (×2): qty 50

## 2016-06-05 MED ORDER — LACTATED RINGERS IV SOLN
INTRAVENOUS | Status: DC
  Administered 2016-06-05 (×2): via INTRAVENOUS
  Administered 2016-06-05: 1000 mL via INTRAVENOUS

## 2016-06-05 MED ORDER — ONDANSETRON HCL 4 MG/2ML IJ SOLN: 4.0000 mg | Freq: Four times a day (QID) | INTRAMUSCULAR | Status: DC | PRN

## 2016-06-05 MED ORDER — CHLORHEXIDINE GLUCONATE 0.12 % MT SOLN
15.0000 mL | Freq: Two times a day (BID) | OROMUCOSAL | Status: DC
  Administered 2016-06-05 – 2016-06-06 (×3): 15 mL via OROMUCOSAL
  Filled 2016-06-05: qty 15

## 2016-06-05 MED ORDER — NOREPINEPHRINE BITARTRATE 1 MG/ML IV SOLN
0.0000 ug/min | INTRAVENOUS | Status: DC
  Administered 2016-06-05: 10 ug/min via INTRAVENOUS
  Administered 2016-06-06: 14 ug/min via INTRAVENOUS
  Filled 2016-06-05: qty 4

## 2016-06-05 MED ORDER — ACETAMINOPHEN 500 MG PO TABS
1000.0000 mg | ORAL_TABLET | Freq: Once | ORAL | Status: AC
  Administered 2016-06-05: 1000 mg via ORAL
  Filled 2016-06-05: qty 2

## 2016-06-05 MED ORDER — SODIUM CHLORIDE 0.9 % IR SOLN
Status: DC | PRN
  Administered 2016-06-05: 3000 mL

## 2016-06-05 MED ORDER — SUCCINYLCHOLINE CHLORIDE 20 MG/ML IJ SOLN
INTRAMUSCULAR | Status: DC | PRN
  Administered 2016-06-05: 140 mg via INTRAVENOUS

## 2016-06-05 MED ORDER — FENTANYL CITRATE (PF) 100 MCG/2ML IJ SOLN
25.0000 ug | INTRAMUSCULAR | Status: DC | PRN
  Administered 2016-06-06 (×2): 25 ug via INTRAVENOUS
  Administered 2016-06-07 (×4): 50 ug via INTRAVENOUS
  Administered 2016-06-07: 100 ug via INTRAVENOUS
  Filled 2016-06-05: qty 2

## 2016-06-05 MED ORDER — ACETAMINOPHEN 325 MG PO TABS: 650.0000 mg | ORAL_TABLET | ORAL | Status: DC | PRN

## 2016-06-05 MED ORDER — VANCOMYCIN HCL IN DEXTROSE 750-5 MG/150ML-% IV SOLN
750.0000 mg | Freq: Two times a day (BID) | INTRAVENOUS | Status: DC
Start: 2016-06-05 — End: 2016-06-08
  Administered 2016-06-05 – 2016-06-08 (×6): 750 mg via INTRAVENOUS
  Filled 2016-06-05 (×7): qty 150

## 2016-06-05 MED ORDER — FENTANYL CITRATE (PF) 100 MCG/2ML IJ SOLN
INTRAMUSCULAR | Status: DC | PRN
  Administered 2016-06-05: 50 ug via INTRAVENOUS
  Administered 2016-06-05 (×2): 25 ug via INTRAVENOUS
  Administered 2016-06-05 (×3): 50 ug via INTRAVENOUS

## 2016-06-05 MED ORDER — IOPAMIDOL (ISOVUE-300) INJECTION 61%
100.0000 mL | Freq: Once | INTRAVENOUS | Status: AC | PRN
  Administered 2016-06-05: 100 mL via INTRAVENOUS

## 2016-06-05 MED ORDER — PIPERACILLIN-TAZOBACTAM 3.375 G IVPB
3.3750 g | Freq: Once | INTRAVENOUS | Status: AC
  Administered 2016-06-05: 3.375 g via INTRAVENOUS
  Filled 2016-06-05: qty 50

## 2016-06-05 MED ORDER — FENTANYL BOLUS VIA INFUSION
50.0000 ug | INTRAVENOUS | Status: DC | PRN
  Administered 2016-06-05 (×2): 50 ug via INTRAVENOUS
  Filled 2016-06-05: qty 50

## 2016-06-05 SURGICAL SUPPLY — 31 items
BAG HAMPER (MISCELLANEOUS) ×2 IMPLANT
BNDG GAUZE ELAST 4 BULKY (GAUZE/BANDAGES/DRESSINGS) ×2 IMPLANT
CATH HICKMAN TRIPLE LUMEN (CATHETERS) ×2 IMPLANT
CHLORAPREP W/TINT 10.5 ML (MISCELLANEOUS) ×2 IMPLANT
CLOTH BEACON ORANGE TIMEOUT ST (SAFETY) ×2 IMPLANT
COVER LIGHT HANDLE STERIS (MISCELLANEOUS) ×4 IMPLANT
ELECT REM PT RETURN 9FT ADLT (ELECTROSURGICAL) ×2
ELECTRODE REM PT RTRN 9FT ADLT (ELECTROSURGICAL) ×1 IMPLANT
GAUZE SPONGE 4X4 12PLY STRL (GAUZE/BANDAGES/DRESSINGS) ×2 IMPLANT
GLOVE BIOGEL M STRL SZ7.5 (GLOVE) ×2 IMPLANT
GLOVE BIOGEL PI IND STRL 7.0 (GLOVE) ×1 IMPLANT
GLOVE BIOGEL PI IND STRL 8.5 (GLOVE) ×1 IMPLANT
GLOVE BIOGEL PI INDICATOR 7.0 (GLOVE) ×1
GLOVE BIOGEL PI INDICATOR 8.5 (GLOVE) ×1
GLOVE SURG SS PI 7.5 STRL IVOR (GLOVE) ×2 IMPLANT
GOWN STRL REUS W/TWL LRG LVL3 (GOWN DISPOSABLE) ×4 IMPLANT
HANDPIECE INTERPULSE COAX TIP (DISPOSABLE) ×1
IV NS IRRIG 3000ML ARTHROMATIC (IV SOLUTION) ×2 IMPLANT
KIT ROOM TURNOVER APOR (KITS) ×2 IMPLANT
MANIFOLD NEPTUNE II (INSTRUMENTS) ×2 IMPLANT
MARKER SKIN DUAL TIP RULER LAB (MISCELLANEOUS) ×2 IMPLANT
NS IRRIG 1000ML POUR BTL (IV SOLUTION) ×2 IMPLANT
PACK BASIC LIMB (CUSTOM PROCEDURE TRAY) IMPLANT
PACK MINOR (CUSTOM PROCEDURE TRAY) ×2 IMPLANT
PAD ABD 5X9 TENDERSORB (GAUZE/BANDAGES/DRESSINGS) ×2 IMPLANT
PAD ARMBOARD 7.5X6 YLW CONV (MISCELLANEOUS) ×2 IMPLANT
SET BASIN LINEN APH (SET/KITS/TRAYS/PACK) ×2 IMPLANT
SET HNDPC FAN SPRY TIP SCT (DISPOSABLE) ×1 IMPLANT
SWAB CULTURE LIQ STUART DBL (MISCELLANEOUS) ×2 IMPLANT
SYR BULB IRRIGATION 50ML (SYRINGE) ×2 IMPLANT
TUBE ANAEROBIC PORT A CUL  W/M (MISCELLANEOUS) ×2 IMPLANT

## 2016-06-05 NOTE — Progress Notes (Signed)
Care link here for transport to [facility]. Respiratory therapy  And Dr. Arnoldo Morale at bedside.

## 2016-06-05 NOTE — Consult Note (Signed)
Emergently consulted for a left buttock abscess in a patient who is septic. On review of the CT scan, patient appears to be developing Fournier's gangrene. Patient had been set up to go to [facility], but due to the urgency of the situation, the patient will be brought to the operating room here for incision and drainage of the left buttock and perirectal region along with a central line placement. He will then be transferred down to [facility] for further management and treatment. This has been discussed with the patient and surgery done in [place]. The risks and benefits of the procedure have been fully explained to the patient, who gave informed consent.

## 2016-06-05 NOTE — Care Management Note (Signed)
Case Management Note  Patient Details  Name: Juan Wheeler MRN: [id number] Date of Birth: 1960-12-14  Subjective/Objective:     Pt transferred from [facility] post I&D for left buttock abscess - pt septic and intubated               Action/Plan:  PTA from home - pt may benefit from [facility] for wound management if discharging home.  CM will continue to monitor for discharge needs   Expected Discharge Date:                  Expected Discharge Plan:  [place]  In-House Referral:     Discharge planning Services  CM Consult  Post Acute Care Choice:    Choice offered to:     DME Arranged:    DME Agency:     HH Arranged:    HH Agency:     Status of Service:  In process, will continue to follow  If discussed at Long Length of Stay Meetings, dates discussed:    Additional Comments:  Maryclare Labrador, RN 06/05/2016, 2:25 PM

## 2016-06-05 NOTE — Progress Notes (Signed)
[place] Progress Note Patient Name: Juan Wheeler DOB: 1960/10/28 MRN: [id number]   Date of Service  06/05/2016  HPI/Events of Note  Hypotension - BP = 74/44. CVP = 8.   eICU Interventions  Will order: 1. 0.9 NaCl 1 liter IV over 1 hour now.  2. Norepinephrine IV infusion. Titrate to MAP >= 65.      Intervention Category Major Interventions: Hypotension - evaluation and management  Teyana Pierron Eugene 06/05/2016, 9:00 PM

## 2016-06-05 NOTE — H&P (Signed)
PULMONARY / CRITICAL CARE MEDICINE   Name: Juan Wheeler MRN: [id number] DOB: June 23, 1961    ADMISSION DATE:  06/05/2016 CONSULTATION DATE:  06/05/16  REFERRING MD:  Dr. Arnoldo Morale / [facility]  CHIEF COMPLAINT:  Buttock abscess  HISTORY OF PRESENT ILLNESS:   55 y/o M with a past medical history of chronic low back pain, CAD (05/2015 EF 54% by stress test), HTN, LV dysfunction, PAD, DVT (2013, no longer on any coagulation), known pulmonary nodules colon cancer & rectal adenocarcinoma treated with oral chemotherapy, LAR with diverting loop ileostomy, subsequent ileostomy closure (2012 at [facility]) who presented to the [facility] ER with complaints of left buttock pain.  The patient had been seen by his primary care physician and referred to the ER for further evaluation. Per chart review he denied fevers, chills, nausea, vomiting, diarrhea, bloody stools/melena. He was seen at [facility] on 9/5 by oncology and started on Bactrim for possible prostatitis.  Symptoms progressed and he sought care 9/8 in the ER.  ER exam reflects entire left buttock erythematous, indurated, warm and tender to palpation. He was found to be hypotensive with systolic BP into the 85I and febrile with a tmax of 103.6.  Initial labs- NA 131, K4.3, CL 100, CO2 22, serum creatinine 2.08, glucose 136, alkaline phosphatase 73, albumin 2.8, total bilirubin 1.4, PCT 1.06, lactic acid 2.54, WBC 20.7, hemoglobin 15.7 and platelets 183. Gen. surgery (Dr. Arnoldo Morale) was consulted. Clindamycin, vancomycin and Zosyn administered in the ER. Aggressive volume resuscitation with 4 L normal saline. CT pelvis obtained which demonstrated changes consistent with significant cellulitis in the left buttock with diffuse irregular air collection medially within the buttock extending into the gluteal muscles and posterior upper left thigh/pelvic cavity. No definitive fluid component identified at that time. Decision was made for the patient to be taken to the OR  for drainage of left buttock abscess given concerns of gangrene.  Post OR, the patient was transferred to [facility] for further evaluation.  PAST MEDICAL HISTORY :  He  has a past medical history of Chronic lower back pain; Colon cancer ([place]); Coronary artery disease; DVT (deep venous thrombosis) ([place]) (~ 2013); GERD (gastroesophageal reflux disease); History of blood transfusion; Hypertension; LV dysfunction; PAD (peripheral artery disease) ([place]); Pulmonary nodules (09/30/2014); Rectal cancer ([place]); and Tobacco abuse.  PAST SURGICAL HISTORY: He  has a past surgical history that includes Colostomy; Abdominal surgery (1990's); Coronary angioplasty with stent (04/17/2013); Inguinal hernia repair (Bilateral, 1990's); Colostomy takedown (2013); Colectomy (2012); left heart catheterization with coronary angiogram (N/A, 04/17/2013); percutaneous stent intervention (04/17/2013); Cardiac catheterization (N/A, 06/14/2015); Cardiac catheterization (Bilateral, 06/14/2015); Femoral-popliteal Bypass Graft (Left, 07/02/2015); and Vein harvest (Left, 07/02/2015).  Allergies  Allergen Reactions  . Darvocet [Propoxyphene N-Acetaminophen] Palpitations  . Propoxyphene Palpitations    No current facility-administered medications on file prior to encounter.    Current Outpatient Prescriptions on File Prior to Encounter  Medication Sig  . amLODipine (NORVASC) 5 MG tablet Take 1 tablet (5 mg total) by mouth daily.  Marland Kitchen aspirin EC 81 MG tablet Take 81 mg by mouth daily.  . baclofen (LIORESAL) 10 MG tablet Reported on 12/27/2015  . isosorbide mononitrate (IMDUR) 30 MG 24 hr tablet TAKE 1/2 TABLET BY MOUTH ONCE DAILY.  Marland Kitchen lisinopril (PRINIVIL,ZESTRIL) 40 MG tablet Take 40 mg by mouth daily.   Marland Kitchen lovastatin (MEVACOR) 10 MG tablet Take 1 tablet (10 mg total) by mouth at bedtime.  Marland Kitchen morphine (MS CONTIN) 60 MG 12 hr tablet Take 1  tablet (60 mg total) by mouth every 8 (eight) hours. Take 1 tablet every 8 hours.  .  nitroGLYCERIN (NITROSTAT) 0.4 MG SL tablet PLACE 1 TABLET UNDER TONGUE IF NEEDED FOR CHEST PAIN..UP TO 3 DOSES  . omeprazole (PRILOSEC) 40 MG capsule Take 40 mg by mouth daily.  . Oxycodone HCl 10 MG TABS Take 1 or 2 tablets every 4 hours as needed for pain..  . sulfamethoxazole-trimethoprim (BACTRIM DS,SEPTRA DS) 800-160 MG tablet Take 1 tablet by mouth 2 (two) times daily.    FAMILY HISTORY:  His indicated that his mother is alive. He indicated that his father is deceased. He indicated that the status of his brother is unknown.    SOCIAL HISTORY: He  reports that he has been smoking Cigarettes.  He started smoking about 42 years ago. He has a 10.00 pack-year smoking history. He has never used smokeless tobacco. He reports that he drinks about 4.2 oz of alcohol per week . He reports that he uses drugs, including Marijuana.  REVIEW OF SYSTEMS:   Unable to complete with patient due to mechanical ventilation.  SUBJECTIVE:    VITAL SIGNS: BP 125/81   Pulse 96   Temp 97.7 F (36.5 C)   Resp 14   Ht '5\' 9"'$  (1.753 m)   Wt 179 lb (81.2 kg)   SpO2 100%   BMI 26.43 kg/m   HEMODYNAMICS:    VENTILATOR SETTINGS: Vent Mode: PRVC FiO2 (%):  [100 %] 100 % Set Rate:  [14 bmp] 14 bmp Vt Set:  [500 mL] 500 mL PEEP:  [5 cmH20] 5 cmH20 Plateau Pressure:  [15 cmH20-19 cmH20] 15 cmH20  INTAKE / OUTPUT: No intake/output data recorded.  PHYSICAL EXAMINATION: General:  Well-developed adult male in no acute distress on mechanical vent Neuro:  [place] HEENT:  MM pink/moist, ETT in place, no jvd Cardiovascular:  S1s2 rrr, no m/r/g  Lungs:  shallow/non-labored on vent, lungs bilaterally coarse  Abdomen:  Soft, non-tender, bsx4 active Musculoskeletal:  No acute deformities  Skin:  Warm/dry, no edema   LABS:  BMET  Recent Labs Lab 06/02/16 0958 06/05/16 0636 06/05/16 0649  NA 139 131* 133*  K 4.3 4.3 4.3  CL 105 100* 98*  CO2 26 22  --   BUN 14 22* 21*  CREATININE 1.48* 2.08*  2.00*  GLUCOSE 121* 136* 136*    Electrolytes  Recent Labs Lab 06/02/16 0958 06/05/16 0636  CALCIUM 9.0 8.4*    CBC  Recent Labs Lab 06/02/16 0958 06/05/16 0636 06/05/16 0649  WBC 21.0* 20.7*  --   HGB 17.7* 15.7 17.0  HCT 50.2 44.1 50.0  PLT 206 183  --     Coag's No results for input(s): APTT, INR in the last 168 hours.  Sepsis Markers  Recent Labs Lab 06/05/16 0636 06/05/16 0649  LATICACIDVEN  --  2.57*  PROCALCITON 1.06  --     ABG No results for input(s): PHART, PCO2ART, PO2ART in the last 168 hours.  Liver Enzymes  Recent Labs Lab 06/02/16 0958 06/05/16 0636  AST 16 40  ALT 13* 26  ALKPHOS 70 73  BILITOT 1.3* 1.4*  ALBUMIN 3.5 2.8*    Cardiac Enzymes No results for input(s): TROPONINI, PROBNP in the last 168 hours.  Glucose No results for input(s): GLUCAP in the last 168 hours.  Imaging Ct Pelvis W Contrast  Result Date: 06/05/2016 CLINICAL DATA:  Left-sided buttock abscess for 1 week EXAM: CT PELVIS WITH CONTRAST TECHNIQUE: Multidetector CT imaging of the  pelvis was performed using the standard protocol following the bolus administration of intravenous contrast. CONTRAST:  142m ISOVUE-300 IOPAMIDOL (ISOVUE-300) INJECTION 61% COMPARISON:  None. FINDINGS: Mild aortoiliac calcifications are identified. The bladder is well distended. No pelvic abnormality is seen. There are changes consistent with significant inflammatory change in the left buttock with a large collection of air which extends deep to the gluteal muscles into the pelvis adjacent to the distal rectum. The air also extends into the substance of the gluteal muscles and into the posterior aspect of the upper thigh in the subcutaneous fat. No demonstrative fluid collection is identified to allow for focal drainage. Diffuse subcutaneous edema is noted consistent with localized cellulitis. IMPRESSION: Changes consistent with significant cellulitis in the left buttock with diffuse irregular  air collection medially within the buttock and extending into the gluteal muscles and posterior upper left thigh as well as into the pelvic cavity adjacent to the distal rectum. Greatest transverse dimension is approximately 14 cm. Greatest craniocaudad dimensions are approximately 12 cm. No definitive fluid component is identified at this time. Electronically Signed   By: MInez CatalinaM.D.   On: 06/05/2016 07:58   Dg Chest Port 1 View  Result Date: 06/05/2016 CLINICAL DATA:  Central line placement. EXAM: PORTABLE CHEST 1 VIEW COMPARISON:  Radiograph of same day. FINDINGS: Stable cardiomediastinal silhouette. Endotracheal tube is seen over tracheal air shadow with distal tip 5 cm above the carina. Nasogastric tube is noted, although distal tip is not well visualized. Interval placement of right internal jugular catheter with distal tip in expected position of cavoatrial junction. Mild central pulmonary vascular congestion is noted with probable left perihilar edema. Significantly increased opacities are noted a right midlung area concerning for atelectasis or possibly inflammation. No pneumothorax is noted. IMPRESSION: Endotracheal tube in grossly good position. Interval placement of right internal jugular catheter with distal tip in grossly good position. No pneumothorax is noted. Nasogastric tube is noted as well, but distal tip is not well visualized on this study. Probable mild left perihilar edema is noted. Interval development of significant right midlung opacities are noted concerning for atelectasis or possibly pneumonia. Electronically Signed   By: JMarijo Conception M.D.   On: 06/05/2016 11:39   Dg Chest Portable 1 View  Result Date: 06/05/2016 CLINICAL DATA:  Hypoxia, low BP now, hypertension, history of rectal and colon cancer. Smoker 1/4 pack per day EXAM: PORTABLE CHEST 1 VIEW COMPARISON:  CT thorax 12/25/2015, chest x-ray 03/28/2013 FINDINGS: The patient is rotated. There are streaky bibasilar  left greater than right opacities, likely reflecting atelectasis. Cardiomediastinal silhouette nonenlarged allowing for rotation. Pulmonary vascularity is upper normal. No overt edema. No pneumothorax. IMPRESSION: 1. Mild streaky bibasilar opacities likely reflecting atelectasis. Electronically Signed   By: KDonavan FoilM.D.   On: 06/05/2016 08:44     STUDIES:  9/8 CT Pelvis >> CT pelvis obtained which demonstrated changes consistent with significant cellulitis in the left buttock with diffuse irregular air collection medially within the buttock extending into the gluteal muscles and posterior upper left thigh/pelvic cavity. No definitive fluid component identified at that time.  CULTURES: Sputum 9/8 >>  Buttock Abscess 9/8 >>  BCx2 9/8 >> UC 9/8 >>   ANTIBIOTICS: Zosyn 9/8 >>  Vanco 9/8 >>  Clinda 9/8 >>   SIGNIFICANT EVENTS: 9/08  Admit with left buttock abscess, concern for necrotizing fasciitis   LINES/TUBES: ETT 9/8 >>  L IJ CVC 9/8 >>   DISCUSSION: 55year old male admitted 9/8 with  a left buttock abscess concerning for necrotizing fasciitis status post surgical I&D. Transferred to [facility] 9/8 for further evaluation.  ASSESSMENT / PLAN:  PULMONARY A: Ventilator support postoperatively P:   PRVC 8 cc/kg  Wean PEEP / FiO2 for sats > 90% CXR post arrival to ICU  CXR in am  ABG now Daily SBT / WUA > anticipate pt will likely be able to be extubated in am 9/9 if no plans for OR per [facility]CS  CARDIOVASCULAR A:  Sepsis - in setting of left buttock abscess Hypotension - secondary to above, resolved with NS  P:  Trend CVP Q4  NS @ 75 ml/hr  RENAL A:   AKI - in setting of sepsis Mild elevation of Lactic Acid  P:   Trend BMP / UOP  Replace electrolytes as indicated   GASTROINTESTINAL A:   Left Buttock Abscess - s/p I&D of  Hx of Adenocarcinoma of Rectum - 2013 P:   NPO  OGT  Begin TF in am if remains intubated   HEMATOLOGIC A:   Leukocytosis - in  setting of left buttock abscess  P:  See ID  Trend CBC  SCD's for DVT prophylaxis  Consider heparin SQ in am if no further surgery plans   INFECTIOUS A:   Left Buttock Abscess s/p I&D  Severe Sepsis - secondary to above  P:   ABX as above  Monitor cultures  Appreciate CCS input, may return to OR in am   ENDOCRINE A:   At Risk Hyper / Hypoglycemia   P:   Monitor glucose on BMP   NEUROLOGIC A:   Sedation needs while on vent  P:   RASS goal: -1 to -2  PRN versed for sedation  Fentanyl gtt for pain  Daily SBT / WUA   FAMILY  - Updates: Family updated at bedside per Dr. Titus Mould   - Inter-disciplinary family meet or Palliative Care meeting due by: 9/15    Noe Gens, NP-C [facility] Pgr: [phone] or if no answer [phone] 06/05/2016, 3:00 PM   STAFF NOTE: I, Merrie Roof, MD FACP have personally reviewed patient's available data, including medical history, events of note, physical examination and test results as part of my evaluation. I have discussed with resident/NP and other care providers such as pharmacist, RN and RRT. In addition, I personally evaluated patient and elicited key findings of: vent dyschrony, ronchi rt greater left, abdo soft, post op dressing, pcxr with rt infiltrate/atx, asp? Peri op?, CT reviewed and now s/p debridement = c/w nec fasc, cotninued clinda with recent levophed needed for toxin inhibiution, continued zosyn vanc, get cvp, get pcxr stat for ett, ABG now with resp status, need fent increase to improve vent dyschrony, to OR in am , no TF with this plan for now, I updated family and wife in room The patient is critically ill with multiple organ systems failure and requires high complexity decision making for assessment and support, frequent evaluation and titration of therapies, application of advanced monitoring technologies and extensive interpretation of multiple databases.   Critical Care Time devoted to patient  care services described in this note is 35 Minutes. This time reflects time of care of this signee: Merrie Roof, MD FACP. This critical care time does not reflect procedure time, or teaching time or supervisory time of PA/NP/Med student/Med Resident etc but could involve care discussion time. Rest per NP/medical resident whose note is outlined above and that I agree with  [place] Paganini. Titus Mould, MD, [place] Pgr: [facility] 06/05/2016 4:16 PM

## 2016-06-05 NOTE — Anesthesia Procedure Notes (Signed)
Procedure Name: Intubation Date/Time: 06/05/2016 10:05 AM Performed by: [name] E Pre-anesthesia Checklist: Patient identified, Patient being monitored, Timeout performed, Emergency Drugs available and Suction available Patient Re-evaluated:Patient Re-evaluated prior to inductionOxygen Delivery Method: Circle system utilized Preoxygenation: Pre-oxygenation with 100% oxygen Intubation Type: IV induction, Cricoid Pressure applied and Rapid sequence Ventilation: Mask ventilation without difficulty Laryngoscope Size: Mac and 3 Grade View: Grade I Tube type: Subglottic suction tube Tube size: 8.0 mm Number of attempts: 1 Airway Equipment and Method: Stylet Placement Confirmation: ETT inserted through vocal cords under direct vision,  positive ETCO2 and breath sounds checked- equal and bilateral Secured at: 21 cm Tube secured with: Tape Dental Injury: Teeth and Oropharynx as per pre-operative assessment

## 2016-06-05 NOTE — ED Notes (Signed)
2nd blood culture obtained

## 2016-06-05 NOTE — ED Provider Notes (Addendum)
TIME SEEN: 6:30 AM  CHIEF COMPLAINT: Left buttock pain, fever  HPI: Pt is a 55 y.o. male with history of rectal adenocarcinoma treated with oral chemotherapy and LAR with diverting loop ileostomy and then subsequent ileostomy closure in 2012 at [facility], CAD status post stent, peripheral arterial disease, DVT no longer on anticoagulation, hypertension who presents to the emergency department with complaints of several days of left buttock pain. Reports he has had subjective fevers and chills at home. No nausea, vomiting or diarrhea. He has not noticed any bloody stools or melena. Was seen by oncology here at [facility] on September 5 and started on Bactrim for possible prostatitis. Was seen by PCP yesterday and told to come to the emergency department but he did not. States symptoms have worsened.  He denies chest pain or shortness of breath. No cough. Has had a history of a stress test in September 2016 which showed an EF of 54%. No echocardiogram. He has no known history of CHF per his report.  ROS: See HPI Constitutional:  fever  Eyes: no drainage  ENT: no runny nose   Cardiovascular:  no chest pain  Resp: no SOB  GI: no vomiting GU: no dysuria Integumentary: no rash  Allergy: no hives  Musculoskeletal: no leg swelling  Neurological: no slurred speech ROS otherwise negative  PAST MEDICAL HISTORY/PAST SURGICAL HISTORY:  Past Medical History:  Diagnosis Date  . Chronic lower back pain    a. Followed by pain management at [facility].  . Colon cancer [facility])    rectal cancer  . Coronary artery disease    a. 03/2013: abnl nuc -> LHC s/p DES to LCx, residual moderate disease in LAD (med rx unless refractory angina). b. Not on BB due to bradycardia.  Marland Kitchen DVT (deep venous thrombosis) ([place]) ~ 2013  . GERD (gastroesophageal reflux disease)   . History of blood transfusion    "once; after throwing up alot of blood" (04/17/2013)  . Hypertension   . LV dysfunction    a. EF 45% in 03/2013.  Marland Kitchen  PAD (peripheral artery disease) ([place])    a. Occlusion of the right internal iliac artery, with significant atherosclerosis in the left internal iliac which was not amenable to reconstruction per notes from [facility].  . Pulmonary nodules 09/30/2014  . Rectal cancer ([place])   . Tobacco abuse     MEDICATIONS:  Prior to Admission medications   Medication Sig Start Date End Date Taking? Authorizing Provider  amLODipine (NORVASC) 5 MG tablet Take 1 tablet (5 mg total) by mouth daily. 12/04/15  Yes Herminio Commons, MD  aspirin EC 81 MG tablet Take 81 mg by mouth daily.   Yes Historical Provider, MD  baclofen (LIORESAL) 10 MG tablet Reported on 12/27/2015 12/10/15  Yes Historical Provider, MD  isosorbide mononitrate (IMDUR) 30 MG 24 hr tablet TAKE 1/2 TABLET BY MOUTH ONCE DAILY. 12/04/15  Yes Herminio Commons, MD  lisinopril (PRINIVIL,ZESTRIL) 40 MG tablet Take 40 mg by mouth daily.  08/29/13  Yes Historical Provider, MD  lovastatin (MEVACOR) 10 MG tablet Take 1 tablet (10 mg total) by mouth at bedtime. 07/04/15  Yes Kimberly A Trinh, [name]  morphine (MS CONTIN) 60 MG 12 hr tablet Take 1 tablet (60 mg total) by mouth every 8 (eight) hours. Take 1 tablet every 8 hours. 03/26/16  Yes Manon Hilding Kefalas, [name]  nitroGLYCERIN (NITROSTAT) 0.4 MG SL tablet PLACE 1 TABLET UNDER TONGUE IF NEEDED FOR CHEST PAIN..UP TO 3  DOSES 02/01/15  Yes Herminio Commons, MD  omeprazole (PRILOSEC) 40 MG capsule Take 40 mg by mouth daily. 05/29/15  Yes Historical Provider, MD  Oxycodone HCl 10 MG TABS Take 1 or 2 tablets every 4 hours as needed for pain.. 06/02/16  Yes [name], [name]  sulfamethoxazole-trimethoprim (BACTRIM DS,SEPTRA DS) 800-160 MG tablet Take 1 tablet by mouth 2 (two) times daily. 06/02/16  Yes [name], [name]    ALLERGIES:  Allergies  Allergen Reactions  . Darvocet [Propoxyphene N-Acetaminophen] Palpitations  . Propoxyphene Palpitations    SOCIAL HISTORY:  Social History  Substance Use Topics   . Smoking status: Light Tobacco Smoker    Packs/day: 0.25    Years: 40.00    Types: Cigarettes    Start date: 03/14/1974  . Smokeless tobacco: Never Used     Comment: 5-6 per day 06/12/15  . Alcohol use 4.2 oz/week    6 Cans of beer, 1 Shots of liquor per week     Comment: 04/17/2013 "bout a 6 pack/wk"    FAMILY HISTORY: Family History  Problem Relation Age of Onset  . Cancer Mother   . Hypertension Mother   . Bleeding Disorder Brother     EXAM: BP (!) 99/54 (BP Location: Right Leg)   Pulse 76   Temp 98.7 F (37.1 C) (Oral)   Resp 16   Ht '5\' 9"'$  (1.753 m)   Wt 179 lb (81.2 kg)   SpO2 95%   BMI 26.43 kg/m  CONSTITUTIONAL: Alert and oriented x 3 and responds appropriately to questions. Chronically ill-appearing, appears uncomfortable, febrile, mildly hypotensive HEAD: Normocephalic EYES: Conjunctivae clear, PERRL ENT: normal nose; no rhinorrhea; moist mucous membranes NECK: Supple, no meningismus, no LAD  CARD: Regular and tachycardic; S1 and S2 appreciated; no murmurs, no clicks, no rubs, no gallops RESP: Normal chest excursion without splinting or tachypnea; breath sounds clear and equal bilaterally; no wheezes, no rhonchi, no rales, no hypoxia or respiratory distress, speaking full sentences ABD/GI: Normal bowel sounds; non-distended; soft, non-tender, no rebound, no guarding, no peritoneal signs GU:  Normal external genitalia, circumcised male, normal penile shaft, no blood or discharge at the urethral meatus, no testicular masses or tenderness on exam, no scrotal swelling, Chaperone present for exam. RECTAL:  Normal rectal tone, no gross blood or melena, guaiac negative, no hemorrhoids appreciated, no fecal impaction, brown colored soft stool in rectal vault, he does have tenderness to palpation on the inside of the left side of the rectum without any purulent drainage. The entire left buttock is erythematous, warm, indurated and very tender to palpation. There does appear to  be some subcutaneous emphysema at the top of the left buttock. No perineal warmth, erythema, subcutaneous emphysema. No drainage. No fluctuance appreciated on exam. BACK:  The back appears normal and is non-tender to palpation, there is no CVA tenderness EXT: Normal ROM in all joints; non-tender to palpation; no edema; normal capillary refill; no cyanosis, no calf tenderness or swelling    SKIN: Normal color for age and race; warm; no rash NEURO: Moves all extremities equally, sensation to light touch intact diffusely, cranial nerves II through XII intact PSYCH: The patient's mood and manner are appropriate. Grooming and personal hygiene are appropriate.  MEDICAL DECISION MAKING: Patient here as a code sepsis. He is hypotensive, febrile. Appears to have a large left buttock cellulitis and possible abscess. I did palpate some subcutaneous emphysema making this concerning for necrotizing fasciitis. He had a creatinine of 1.48 three  days ago.  We'll obtain a CT of his pelvis with IV contrast only. Labs, cultures pending. We'll give vancomycin, Zosyn, IV fluids, Tylenol.  ED PROGRESS: 8:00 AM  Patient has a leukocytosis of 20,000 with left shift. Lactate is 2.57.  Pt has AKI.  Getting 3L IVF.  CT shows subcutaneous gas concerning for necrotizing fasciitis. No abscess for drainage. He has received vancomycin, Zosyn.  Will add on IV clindamycin.  Dr. Arnoldo Morale with general surgery has seen the patient in the emergency department. Appreciate his help. We both feel given how sick patient is, he would benefit from critical care and higher level of care at [facility]. Burnis Medin discuss with critical care team, general surgery and [place]. Patient is aware of this plan. We'll give a fourth liter of IV fluid. He may need vasopressors. Patient reports he is a full code.   Patient has had some hypoxia. Does not wear oxygen at home. Lungs are still clear to auscultation. Will obtain a portable chest x-ray.  On 2L  [place].    Patient still mentating normally.   8:15 AM  D/w Dr. Halford Chessman with critical care. He states he is happy to accept the patient as long as general surgery in [place] can manage the patient as well. We are awaiting a phone call back from general surgery.  Patient and his wife had been updated with this plan.  8:35 AM  D/w Kathlee Nations PA with general surgery. She will discuss case with Dr. Georgette Dover, surgeon on-call at [facility]. They will accept the patient.  [facility] on way to [facility] for emergent transfer.  [facility] has ICU beds available at this time.  BP 77/61 with 4 L IVF.  Will start IV Levophed.  Pt's chest x-ray shows some pulmonary edema. He is satting well on 2 L nasal cannula. Denies any shortness of breath. Lungs are still clear on my examination.  8:45 AM  D/w Dr. Gershon Crane who will d/w Dr. Arnoldo Morale for plan on best management for patient.  Will update Dr. Halford Chessman.  Dr. Jeanell Sparrow in ED aware of patient and plan.    Date: 06/05/2016 7:08 AM  Rate: 129  Rhythm: Sinus tachycardia  QRS Axis: normal  Intervals: normal  ST/T Wave abnormalities: normal  Conduction Disutrbances: none  Narrative Interpretation: Sinus tachycardia, artifact, rate is faster than previous EKGs      CRITICAL CARE Performed by: Nyra Jabs   Total critical care time: 60 minutes  Critical care time was exclusive of separately billable procedures and treating other patients.  Critical care was necessary to treat or prevent imminent or life-threatening deterioration.  Critical care was time spent personally by me on the following activities: development of treatment plan with patient and/or surrogate as well as nursing, discussions with consultants, evaluation of patient's response to treatment, examination of patient, obtaining history from patient or surrogate, ordering and performing treatments and interventions, ordering and review of laboratory studies, ordering and review of radiographic studies, pulse oximetry and  re-evaluation of patient's condition.    [place], DO 06/05/16 [place], DO 06/05/16 [phone]

## 2016-06-05 NOTE — ED Notes (Signed)
Pt transported to OR

## 2016-06-05 NOTE — Anesthesia Preprocedure Evaluation (Signed)
Anesthesia Evaluation  Patient identified by MRN, date of birth, ID band Patient awake    Reviewed: Allergy & Precautions, NPO status , Patient's Chart, lab work & pertinent test results  History of Anesthesia Complications Negative for: history of anesthetic complications  Airway Mallampati: III  TM Distance: >3 FB Neck ROM: Full    Dental  (+) Edentulous Upper, Partial Lower, Dental Advisory Given   Pulmonary COPD, Current Smoker,    breath sounds clear to auscultation       Cardiovascular hypertension, Pt. on medications (-) angina+ CAD, + Cardiac Stents, + Peripheral Vascular Disease and + DVT   Rhythm:Regular Rate:Normal  9/16 stress: There was no ST segment deviation noted during stress, EF: 54%. The study is normal. There are no perfusion defects consistent with infarct or ischemia.   Neuro/Psych PSYCHIATRIC DISORDERS negative neurological ROS     GI/Hepatic Neg liver ROS, GERD  Medicated and Controlled,  Endo/Other  negative endocrine ROS  Renal/GU negative Renal ROS     Musculoskeletal   Abdominal   Peds  Hematology negative hematology ROS (+)   Anesthesia Other Findings Sepsis with hypotension  Reproductive/Obstetrics                             Anesthesia Physical Anesthesia Plan  ASA: III and emergent  Anesthesia Plan: General   Post-op Pain Management:    Induction: Intravenous, Rapid sequence and Cricoid pressure planned  Airway Management Planned: Oral ETT  Additional Equipment: Arterial line and CVP  Intra-op Plan:   Post-operative Plan: Post-operative intubation/ventilation  Informed Consent: I have reviewed the patients History and Physical, chart, labs and discussed the procedure including the risks, benefits and alternatives for the proposed anesthesia with the patient or authorized representative who has indicated his/her understanding and acceptance.      Plan Discussed with:   Anesthesia Plan Comments:         Anesthesia Quick Evaluation

## 2016-06-05 NOTE — Progress Notes (Signed)
[place] Progress Note Patient Name: Juan Wheeler DOB: 1961-05-31 MRN: [id number]   Date of Service  06/05/2016  HPI/Events of Note  Several conflicting IV fluid orders.  eICU Interventions  Will order: 1. 0.9 NaCl to run at 100 mL/hour.      Intervention Category Minor Interventions: Routine modifications to care plan (e.g. PRN medications for pain, fever)  Sommer,Steven Cornelia Copa 06/05/2016, 5:22 PM

## 2016-06-05 NOTE — Anesthesia Postprocedure Evaluation (Signed)
Anesthesia Post Note  Patient: Juan Wheeler  Procedure(s) Performed: Procedure(s) (LRB): INCISION AND DRAINAGE ABSCESS (Left)  Patient location during evaluation: PACU Anesthesia Type: General Level of consciousness: patient remains intubated per anesthesia plan Pain management: pain level controlled Vital Signs Assessment: post-procedure vital signs reviewed and stable Respiratory status: patient on ventilator - see flowsheet for VS Cardiovascular status: stable : na. Anesthetic complications: no Comments: Late entry, patient transferred to [facility] as planned.    Last Vitals:  Vitals:   06/05/16 1600 06/05/16 1700  BP: (!) 191/90 122/83  Pulse: (!) 105 96  Resp: (!) 27 20  Temp:      Last Pain:  Vitals:   06/05/16 1539  TempSrc: Oral  PainSc:                  [name]

## 2016-06-05 NOTE — Progress Notes (Signed)
ABG results called to Dr. Wallis Bamberg. New orders received.

## 2016-06-05 NOTE — ED Notes (Signed)
Patient transported to CT 

## 2016-06-05 NOTE — ED Triage Notes (Signed)
Pt has abscess to left buttocks, saw doctor yesterday and told him to come here.

## 2016-06-05 NOTE — Transfer of Care (Signed)
Immediate Anesthesia Transfer of Care Note  Patient: Juan Wheeler  Procedure(s) Performed: Procedure(s): INCISION AND DRAINAGE ABSCESS (Left)  Patient Location: PACU  Anesthesia Type:General  Level of Consciousness: sedated  Airway & Oxygen Therapy: Patient remains intubated per anesthesia plan  Post-op Assessment: Report given to RN  Post vital signs: Reviewed and stable  Last Vitals:  Vitals:   06/05/16 0918 06/05/16 0941  BP: (!) 87/62   Pulse: (!) 126   Resp: 20   Temp:  36.5 C    Last Pain:  Vitals:   06/05/16 0941  TempSrc: Oral  PainSc:          Complications: No apparent anesthesia complications

## 2016-06-05 NOTE — Progress Notes (Signed)
[place] Progress Note Patient Name: Juan Wheeler DOB: [birth year] MRN: [id number]   Date of Service  06/05/2016  HPI/Events of Note  ABG on 100%/PRVC 14/TV 500/P 5 = 7.20/50/69/19.  eICU Interventions  Will order: 1. Increase PRVC rate to 22 and TV to 550. 2. ABG at 5 PM.     Intervention Category Major Interventions: Acid-Base disturbance - evaluation and management;Respiratory failure - evaluation and management  Lysle Dingwall 06/05/2016, 3:54 PM

## 2016-06-05 NOTE — Progress Notes (Signed)
ANTIBIOTIC CONSULT NOTE-Preliminary  Pharmacy Consult for Vancomycin and Zosyn Indication: sepsis  Allergies  Allergen Reactions  . Darvocet [Propoxyphene N-Acetaminophen] Palpitations  . Propoxyphene Palpitations   Patient Measurements: Height: '5\' 9"'$  (175.3 cm) Weight: 179 lb (81.2 kg) IBW/kg (Calculated) : 70.7  Vital Signs: Temp: 103.6 F (39.8 C) (09/08 0651) Temp Source: Rectal (09/08 0651) BP: 98/57 (09/08 0702) Pulse Rate: 73 (09/08 0702)  Labs:  Recent Labs  06/02/16 0958 06/05/16 0636 06/05/16 0649  WBC 21.0* 20.7*  --   HGB 17.7* 15.7 17.0  PLT 206 183  --   CREATININE 1.48* 2.08* 2.00*   Estimated Creatinine Clearance: 41.2 mL/min (by C-G formula based on SCr of 2 mg/dL).  No results for input(s): VANCOTROUGH, VANCOPEAK, VANCORANDOM, GENTTROUGH, GENTPEAK, GENTRANDOM, TOBRATROUGH, TOBRAPEAK, TOBRARND, AMIKACINPEAK, AMIKACINTROU, AMIKACIN in the last 72 hours.   Microbiology: No results found for this or any previous visit (from the past 720 hour(s)).  Medical History: Past Medical History:  Diagnosis Date  . Chronic lower back pain    a. Followed by pain management at [facility].  . Colon cancer [facility])    rectal cancer  . Coronary artery disease    a. 03/2013: abnl nuc -> LHC s/p DES to LCx, residual moderate disease in LAD (med rx unless refractory angina). b. Not on BB due to bradycardia.  Marland Kitchen DVT (deep venous thrombosis) ([place]) ~ 2013  . GERD (gastroesophageal reflux disease)   . History of blood transfusion    "once; after throwing up alot of blood" (04/17/2013)  . Hypertension   . LV dysfunction    a. EF 45% in 03/2013.  Marland Kitchen PAD (peripheral artery disease) ([place])    a. Occlusion of the right internal iliac artery, with significant atherosclerosis in the left internal iliac which was not amenable to reconstruction per notes from [facility].  . Pulmonary nodules 09/30/2014  . Rectal cancer ([place])   . Tobacco abuse    Assessment: 55yo male.  Estimated  Creatinine Clearance: 41.2 mL/min (by C-G formula based on SCr of 2 mg/dL).   Asked to initiate Vancomycin and Zosyn for sepsis.   Goal of Therapy:  Eradicate infection.  Plan:  Preliminary review of pertinent patient information completed.  Protocol will be initiated with a one-time dose(s) of Zosyn 3.375gm and Vancomycin '1000mg'$ .  Forestine Na clinical pharmacist will complete review when admitted to assess patient and finalize treatment regimen.  Hart Robinsons A, [facility] 06/05/2016,7:42 AM

## 2016-06-05 NOTE — ED Notes (Signed)
Dr Leonides Schanz at bedside . Aware of BP. Pressure bags applied.

## 2016-06-05 NOTE — Progress Notes (Signed)
Pharmacy Antibiotic Note Juan Wheeler is a 55 y.o. male admitted on 06/05/2016 with L buttocks abscess s/p drainage in OR on 9/8. Pharmacy has been consulted for Zosyn, Clindamycin and vancomycin dosing.  Plan: 1. Vancomycin 750 IV every 12 hours.  Goal trough 15-20 mcg/mL. 2. Zosyn 3.375g IV q8h (4 hour infusion).  3. Continue Clindamycin 600 mg IV every 6 hours pending culture results  4. Follow up culture data and narrow abx as feasible 5. Trend renal function     Height: '5\' 9"'$  (175.3 cm) Weight: 179 lb (81.2 kg) IBW/kg (Calculated) : 70.7  Temp (24hrs), Avg:98.6 F (37 C), Min:97.6 F (36.4 C), Max:103.6 F (39.8 C)   Recent Labs Lab 06/02/16 0958 06/05/16 0636 06/05/16 0649  WBC 21.0* 20.7*  --   CREATININE 1.48* 2.08* 2.00*  LATICACIDVEN  --   --  2.57*    Estimated Creatinine Clearance: 41.2 mL/min (by C-G formula based on SCr of 2 mg/dL).    Allergies  Allergen Reactions  . Darvocet [Propoxyphene N-Acetaminophen] Palpitations  . Propoxyphene Palpitations    Antimicrobials this admission: 9/8 Zosyn >>  9/8 Vancomycin  >>  9/8 Clindamycin >>  Dose adjustments this admission: n/a  Microbiology results: 9/8 BCx: px 9/8 OR cx: px  Thank you for allowing pharmacy to be a part of this patient's care.  Vincenza Hews, PharmD, BCPS 06/05/2016, 4:21 PM Pager: [phone]

## 2016-06-05 NOTE — Progress Notes (Signed)
Initial Nutrition Assessment  DOCUMENTATION CODES:   Not applicable  INTERVENTION:    If unable to extubate patient within 24 hours, recommend initiate TF via OGT with Vital AF 1.2 at goal rate of 70 ml/h (1680 ml per day) to provide 2016 kcals, 126 gm protein, 1362 ml free water daily.  NUTRITION DIAGNOSIS:   Inadequate oral intake related to inability to eat as evidenced by NPO status.  GOAL:   Patient will meet greater than or equal to 90% of their needs  MONITOR:   Vent status, Labs, Skin, I & O's  REASON FOR ASSESSMENT:   Ventilator    ASSESSMENT:   55 year old male with a history of rectal cancer managed at [facility] by low anterior resection with diverting loop ileostomy/ s/p ileostomy reversal in 2014, who presented to [facility] ED this morning with left buttock pain. S/P I&D of left buttock for Fournier's gangrene.   Patient remained intubated post surgery. May require return to OR tomorrow.  Patient is currently intubated on ventilator support. MV: 7.5 L/min Temp (24hrs), Avg:98.7 F (37.1 C), Min:97.6 F (36.4 C), Max:103.6 F (39.8 C)  Labs reviewed: sodium low, BUN & creatinine are elevated. Medications reviewed. Unable to complete Nutrition-Focused physical exam at this time.   Diet Order:  Diet NPO time specified  Skin:  Wound (see comment) (Left buttock Fournier's gangrene S/P I&D)  Last BM:  unknown  Height:   Ht Readings from Last 1 Encounters:  06/05/16 '5\' 9"'$  (1.753 m)    Weight:   Wt Readings from Last 1 Encounters:  06/05/16 179 lb (81.2 kg)    Ideal Body Weight:  72.7 kg  BMI:  Body mass index is 26.43 kg/m.  Estimated Nutritional Needs:   Kcal:  2000  Protein:  125-145 gm  Fluid:  >/= 2 L  EDUCATION NEEDS:   No education needs identified at this time  Molli Barrows, [place], [place], [place] Pager [phone] After Hours Pager [phone]

## 2016-06-05 NOTE — ED Notes (Signed)
IV in right arm infiltrated when attempting to obtain blood cultures. IV removed

## 2016-06-05 NOTE — Progress Notes (Signed)
[place] Progress Note Patient Name: Juan Wheeler DOB: 1961-09-24 MRN: [id number]   Date of Service  06/05/2016  HPI/Events of Note  Notified by bedside nurse that patient has transitioned back to atrial fibrillation. A. fib confirmed on telemetry. Patient did have atrial fibrillation this morning as well. Additionally, he has become more diaphoretic and has new/escalating vasopressor requirement this evening. Patient was given IV bolus and is currently 4.5 L approximately positive. Has central venous access. Currently not in rapid ventricular response   eICU Interventions  1. Continuing telemetry monitoring.  2. Checking stat basic metabolic panel & serum magnesium. 3. Trending troponin I every 6 hours  4. Ordering echocardiogram for the morning      Intervention Category Major Interventions: Arrhythmia - evaluation and management  Tera Partridge 06/05/2016, 10:22 PM

## 2016-06-05 NOTE — Anesthesia Procedure Notes (Deleted)
Procedure Name: Intubation Date/Time: 06/05/2016 9:49 AM Performed by: Lieutenant Diego Pre-anesthesia Checklist: Patient identified, Emergency Drugs available, Suction available and Patient being monitored Patient Re-evaluated:Patient Re-evaluated prior to inductionOxygen Delivery Method: Circle system utilized Preoxygenation: Pre-oxygenation with 100% oxygen Intubation Type: IV induction Ventilation: Mask ventilation without difficulty Laryngoscope Size: Mac and 3 Tube type: Oral Number of attempts: 1 Airway Equipment and Method: Stylet and Oral airway Placement Confirmation: ETT inserted through vocal cords under direct vision,  positive ETCO2 and breath sounds checked- equal and bilateral Tube secured with: Tape Dental Injury: Teeth and Oropharynx as per pre-operative assessment

## 2016-06-05 NOTE — Consult Note (Signed)
Reason for Consult:Transfer from [facility] - left buttock abscess Referring Physician: Dr. Fabian November Juan Wheeler is an 56 y.o. male.  HPI: This is a 55 year old male with a history of rectal cancer managed at [facility] by low anterior resection with diverting loop ileostomy/ s/p ileostomy reversal in 2014, who presented to [facility] ED this morning with left buttock pain.  He was apparently seen by Oncology at [facility] on 9/5 with rectal discomfort/ pain with spreading of buttocks.  He was started on Bactrim.  He was seen by PCP on 06/04/16 and was told to go to the ED for evaluation of left buttock abscess, but he did not come to the ED until this morning.  He underwent incision and drainage of large left buttock abscess at [facility] by Dr. Arnoldo Morale, but was transferred post-op for critical care management.  Currently, he is intubated but hemodynamically stable with no pressor support.  Operative note from Dr. Arnoldo Morale - "A longitudinal incision was made over the left buttock towards the midline. The dissection was taken down to the subcutaneous tissue. Aerobic and anaerobic cultures were taken and sent to microbiology. Gangrenous subcutaneous tissue as well as muscle along with liquefaction necrosis was found. Any necrotic tissue was sharply debrided with scissors. Pulse lavage therapy was then performed. A bleeding was controlled using Bovie electrocautery. A whole roll of Kerlex that was impregnated with Betadine was placed into the wound. A dry sterile dressing was then applied."   Past Medical History:  Diagnosis Date  . Chronic lower back pain    a. Followed by pain management at [facility].  . Colon cancer [facility])    rectal cancer  . Coronary artery disease    a. 03/2013: abnl nuc -> LHC s/p DES to LCx, residual moderate disease in LAD (med rx unless refractory angina). b. Not on BB due to bradycardia.  Marland Kitchen DVT (deep venous thrombosis) ([place]) ~ 2013  . GERD (gastroesophageal reflux disease)    . History of blood transfusion    "once; after throwing up alot of blood" (04/17/2013)  . Hypertension   . LV dysfunction    a. EF 45% in 03/2013.  Marland Kitchen PAD (peripheral artery disease) ([place])    a. Occlusion of the right internal iliac artery, with significant atherosclerosis in the left internal iliac which was not amenable to reconstruction per notes from [facility].  . Pulmonary nodules 09/30/2014  . Rectal cancer ([place])   . Tobacco abuse     Past Surgical History:  Procedure Laterality Date  . ABDOMINAL SURGERY  1990's   'for stomach ulcers" (04/17/2013)  . COLECTOMY  2012   "for rectal cancer" (04/17/2013)  . COLOSTOMY    . COLOSTOMY TAKEDOWN  2013  . CORONARY ANGIOPLASTY WITH STENT PLACEMENT  04/17/2013   "?1" (04/17/2013)  . FEMORAL-POPLITEAL BYPASS GRAFT Left 07/02/2015   Procedure: BYPASS GRAFT LEFT COMMON FEMORAL ARTERY TO LEFT ABOVE KNEE POPLITEAL ARTERY - USING LEFT GREATER SAPPHENOUS VEIN;  Surgeon: Elam Dutch, MD;  Location: [place];  Service: Vascular;  Laterality: Left;  . INGUINAL HERNIA REPAIR Bilateral 1990's  . LEFT HEART CATHETERIZATION WITH CORONARY ANGIOGRAM N/A 04/17/2013   Procedure: LEFT HEART CATHETERIZATION WITH CORONARY ANGIOGRAM;  Surgeon: Peter M Martinique, MD;  Location: [facility] CATH LAB;  Service: Cardiovascular;  Laterality: N/A;  . PERCUTANEOUS STENT INTERVENTION  04/17/2013   Procedure: PERCUTANEOUS STENT INTERVENTION;  Surgeon: Peter M Martinique, MD;  Location: [facility] CATH LAB;  Service: Cardiovascular;;  .  PERIPHERAL VASCULAR CATHETERIZATION N/A 06/14/2015   Procedure: Abdominal Aortogram;  Surgeon: Elam Dutch, MD;  Location: [place] CV LAB;  Service: Cardiovascular;  Laterality: N/A;  . PERIPHERAL VASCULAR CATHETERIZATION Bilateral 06/14/2015   Procedure: Lower Extremity Angiography;  Surgeon: Elam Dutch, MD;  Location: [place] CV LAB;  Service: Cardiovascular;  Laterality: Bilateral;  . VEIN HARVEST Left 07/02/2015   Procedure: VEIN HARVEST - LEFT  GREATER SAPPHENOUS VEIN;  Surgeon: Elam Dutch, MD;  Location: [facility] OR;  Service: Vascular;  Laterality: Left;    Family History  Problem Relation Age of Onset  . Cancer Mother   . Hypertension Mother   . Bleeding Disorder Brother     Social History:  reports that he has been smoking Cigarettes.  He started smoking about 42 years ago. He has a 10.00 pack-year smoking history. He has never used smokeless tobacco. He reports that he drinks about 4.2 oz of alcohol per week . He reports that he uses drugs, including Marijuana.  Allergies:  Allergies  Allergen Reactions  . Darvocet [Propoxyphene N-Acetaminophen] Palpitations  . Propoxyphene Palpitations    Medications:   Prior to Admission medications   Medication Sig Start Date End Date Taking? Authorizing Provider  amLODipine (NORVASC) 5 MG tablet Take 1 tablet (5 mg total) by mouth daily. 12/04/15  Yes Herminio Commons, MD  aspirin EC 81 MG tablet Take 81 mg by mouth daily.   Yes Historical Provider, MD  baclofen (LIORESAL) 10 MG tablet Reported on 12/27/2015 12/10/15  Yes Historical Provider, MD  isosorbide mononitrate (IMDUR) 30 MG 24 hr tablet TAKE 1/2 TABLET BY MOUTH ONCE DAILY. 12/04/15  Yes Herminio Commons, MD  lisinopril (PRINIVIL,ZESTRIL) 40 MG tablet Take 40 mg by mouth daily.  08/29/13  Yes Historical Provider, MD  lovastatin (MEVACOR) 10 MG tablet Take 1 tablet (10 mg total) by mouth at bedtime. 07/04/15  Yes Kimberly A Trinh, [name]  morphine (MS CONTIN) 60 MG 12 hr tablet Take 1 tablet (60 mg total) by mouth every 8 (eight) hours. Take 1 tablet every 8 hours. 03/26/16  Yes Manon Hilding Kefalas, [name]  nitroGLYCERIN (NITROSTAT) 0.4 MG SL tablet PLACE 1 TABLET UNDER TONGUE IF NEEDED FOR CHEST PAIN..UP TO 3 DOSES 02/01/15  Yes Herminio Commons, MD  omeprazole (PRILOSEC) 40 MG capsule Take 40 mg by mouth daily. 05/29/15  Yes Historical Provider, MD  Oxycodone HCl 10 MG TABS Take 1 or 2 tablets every 4 hours as needed for pain.. 06/02/16   Yes [name], [name]  sulfamethoxazole-trimethoprim (BACTRIM DS,SEPTRA DS) 800-160 MG tablet Take 1 tablet by mouth 2 (two) times daily. 06/02/16  Yes [name], [name]    Results for orders placed or performed during the hospital encounter of 06/05/16 (from the past 48 hour(s))  CBC with Differential     Status: Abnormal   Collection Time: 06/05/16  6:36 AM  Result Value Ref Range   WBC 20.7 (H) 4.0 - 10.5 K/uL   RBC 4.72 4.22 - 5.81 MIL/uL   Hemoglobin 15.7 13.0 - 17.0 g/dL   HCT 44.1 39.0 - 52.0 %   MCV 93.4 78.0 - 100.0 fL   MCH 33.3 26.0 - 34.0 pg   MCHC 35.6 30.0 - 36.0 g/dL   RDW 14.2 11.5 - 15.5 %   Platelets 183 150 - 400 K/uL   Neutrophils Relative % 80 %   Lymphocytes Relative 5 %   Monocytes Relative 15 %   Eosinophils Relative  0 %   Basophils Relative 0 %   Neutro Abs 16.6 (H) 1.7 - 7.7 K/uL   Lymphs Abs 1.0 0.7 - 4.0 K/uL   Monocytes Absolute 3.1 (H) 0.1 - 1.0 K/uL   Eosinophils Absolute 0.0 0.0 - 0.7 K/uL   Basophils Absolute 0.0 0.0 - 0.1 K/uL   WBC Morphology INCREASED BANDS (>20% BANDS)     Comment: ATYPICAL LYMPHOCYTES  Comprehensive metabolic panel     Status: Abnormal   Collection Time: 06/05/16  6:36 AM  Result Value Ref Range   Sodium 131 (L) 135 - 145 mmol/L   Potassium 4.3 3.5 - 5.1 mmol/L   Chloride 100 (L) 101 - 111 mmol/L   CO2 22 22 - 32 mmol/L   Glucose, Bld 136 (H) 65 - 99 mg/dL   BUN 22 (H) 6 - 20 mg/dL   Creatinine, Ser 2.08 (H) 0.61 - 1.24 mg/dL   Calcium 8.4 (L) 8.9 - 10.3 mg/dL   Total Protein 7.0 6.5 - 8.1 g/dL   Albumin 2.8 (L) 3.5 - 5.0 g/dL   AST 40 15 - 41 U/L   ALT 26 17 - 63 U/L   Alkaline Phosphatase 73 38 - 126 U/L   Total Bilirubin 1.4 (H) 0.3 - 1.2 mg/dL   GFR calc non Af Amer 34 (L) >60 mL/min   GFR calc Af Amer 39 (L) >60 mL/min    Comment: (NOTE) The eGFR has been calculated using the CKD EPI equation. This calculation has not been validated in all clinical situations. eGFR's persistently <60 mL/min signify  possible Chronic Kidney Disease.    Anion gap 9 5 - 15  Blood culture (routine x 2)     Status: None (Preliminary result)   Collection Time: 06/05/16  6:36 AM  Result Value Ref Range   Specimen Description BLOOD    Special Requests NONE    Culture NO GROWTH <12 HOURS    Report Status PENDING   Procalcitonin     Status: None   Collection Time: 06/05/16  6:36 AM  Result Value Ref Range   Procalcitonin 1.06 ng/mL    Comment:        Interpretation: PCT > 0.5 ng/mL and <= 2 ng/mL: Systemic infection (sepsis) is possible, but other conditions are known to elevate PCT as well. (NOTE)         ICU PCT Algorithm               Non ICU PCT Algorithm    ----------------------------     ------------------------------         PCT < 0.25 ng/mL                 PCT < 0.1 ng/mL     Stopping of antibiotics            Stopping of antibiotics       strongly encouraged.               strongly encouraged.    ----------------------------     ------------------------------       PCT level decrease by               PCT < 0.25 ng/mL       >= 80% from peak PCT       OR PCT 0.25 - 0.5 ng/mL          Stopping of antibiotics  encouraged.     Stopping of antibiotics           encouraged.    ----------------------------     ------------------------------       PCT level decrease by              PCT >= 0.25 ng/mL       < 80% from peak PCT        AND PCT >= 0.5 ng/mL             Continuing antibiotics                                              encouraged.       Continuing antibiotics            encouraged.    ----------------------------     ------------------------------     PCT level increase compared          PCT > 0.5 ng/mL         with peak PCT AND          PCT >= 0.5 ng/mL             Escalation of antibiotics                                          strongly encouraged.      Escalation of antibiotics        strongly encouraged.   I-Stat CG4 Lactic  Acid, ED     Status: Abnormal   Collection Time: 06/05/16  6:49 AM  Result Value Ref Range   Lactic Acid, Venous 2.57 (HH) 0.5 - 1.9 mmol/L   Comment NOTIFIED PHYSICIAN   I-stat chem 8, ed     Status: Abnormal   Collection Time: 06/05/16  6:49 AM  Result Value Ref Range   Sodium 133 (L) 135 - 145 mmol/L   Potassium 4.3 3.5 - 5.1 mmol/L   Chloride 98 (L) 101 - 111 mmol/L   BUN 21 (H) 6 - 20 mg/dL   Creatinine, Ser 2.00 (H) 0.61 - 1.24 mg/dL   Glucose, Bld 136 (H) 65 - 99 mg/dL   Calcium, Ion 1.10 (L) 1.15 - 1.40 mmol/L   TCO2 21 0 - 100 mmol/L   Hemoglobin 17.0 13.0 - 17.0 g/dL   HCT 50.0 39.0 - 52.0 %  POC occult blood, ED     Status: None   Collection Time: 06/05/16  6:56 AM  Result Value Ref Range   Fecal Occult Bld NEGATIVE NEGATIVE    Ct Pelvis W Contrast  Result Date: 06/05/2016 CLINICAL DATA:  Left-sided buttock abscess for 1 week EXAM: CT PELVIS WITH CONTRAST TECHNIQUE: Multidetector CT imaging of the pelvis was performed using the standard protocol following the bolus administration of intravenous contrast. CONTRAST:  158m ISOVUE-300 IOPAMIDOL (ISOVUE-300) INJECTION 61% COMPARISON:  None. FINDINGS: Mild aortoiliac calcifications are identified. The bladder is well distended. No pelvic abnormality is seen. There are changes consistent with significant inflammatory change in the left buttock with a large collection of air which extends deep to the gluteal muscles into the pelvis adjacent to the distal rectum. The air also extends into the substance of the gluteal muscles and into the posterior aspect of the upper  thigh in the subcutaneous fat. No demonstrative fluid collection is identified to allow for focal drainage. Diffuse subcutaneous edema is noted consistent with localized cellulitis. IMPRESSION: Changes consistent with significant cellulitis in the left buttock with diffuse irregular air collection medially within the buttock and extending into the gluteal muscles and  posterior upper left thigh as well as into the pelvic cavity adjacent to the distal rectum. Greatest transverse dimension is approximately 14 cm. Greatest craniocaudad dimensions are approximately 12 cm. No definitive fluid component is identified at this time. Electronically Signed   By: Inez Catalina M.D.   On: 06/05/2016 07:58   Dg Chest Port 1 View  Result Date: 06/05/2016 CLINICAL DATA:  Central line placement. EXAM: PORTABLE CHEST 1 VIEW COMPARISON:  Radiograph of same day. FINDINGS: Stable cardiomediastinal silhouette. Endotracheal tube is seen over tracheal air shadow with distal tip 5 cm above the carina. Nasogastric tube is noted, although distal tip is not well visualized. Interval placement of right internal jugular catheter with distal tip in expected position of cavoatrial junction. Mild central pulmonary vascular congestion is noted with probable left perihilar edema. Significantly increased opacities are noted a right midlung area concerning for atelectasis or possibly inflammation. No pneumothorax is noted. IMPRESSION: Endotracheal tube in grossly good position. Interval placement of right internal jugular catheter with distal tip in grossly good position. No pneumothorax is noted. Nasogastric tube is noted as well, but distal tip is not well visualized on this study. Probable mild left perihilar edema is noted. Interval development of significant right midlung opacities are noted concerning for atelectasis or possibly pneumonia. Electronically Signed   By: Marijo Conception, M.D.   On: 06/05/2016 11:39   Dg Chest Portable 1 View  Result Date: 06/05/2016 CLINICAL DATA:  Hypoxia, low BP now, hypertension, history of rectal and colon cancer. Smoker 1/4 pack per day EXAM: PORTABLE CHEST 1 VIEW COMPARISON:  CT thorax 12/25/2015, chest x-ray 03/28/2013 FINDINGS: The patient is rotated. There are streaky bibasilar left greater than right opacities, likely reflecting atelectasis. Cardiomediastinal  silhouette nonenlarged allowing for rotation. Pulmonary vascularity is upper normal. No overt edema. No pneumothorax. IMPRESSION: 1. Mild streaky bibasilar opacities likely reflecting atelectasis. Electronically Signed   By: Donavan Foil M.D.   On: 06/05/2016 08:44    ROS Blood pressure 125/81, pulse 96, temperature 97.7 F (36.5 C), resp. rate 14, height 5' 9"  (1.753 m), weight 81.2 kg (179 lb), SpO2 100 %. Physical Exam Ill-appearing male - intubated, calm Eyes - PERRL, conjunctivae clear ENT - moist mucous membranes/ normal dentition Neck - no thyromegaly, no masses CV - reg rate and rhythm; no murmurs Lungs - CTA B; ETT in place; no rales Abd - soft, non-tender; no masses; no obvious hernia Left buttock - open wound packed with Betadine-soaked gauze; some surrounding induration; no involvement of right buttock; does not seem to involve anus or rectal vault  Assessment/Plan: Large left buttock abscess with some involvement of the gluteus maximus CT shows extension of air into the pelvis.  Patient has no peritoneal signs The abscess seems to be adequately drained. Will recheck wound in AM.  May need further debridement in OR.  Would consider leaving intubated at least until tomorrow in case we need to return to OR. Cultures from OR are pending.   IV antibiotics  Juan Wheeler K. 06/05/2016, 2:35 PM

## 2016-06-05 NOTE — Op Note (Signed)
Patient:  Juan Wheeler  DOB:  Jan 23, 1961  MRN:  [id number]   Preop Diagnosis:  Sepsis, left buttock gas gangrene  Postop Diagnosis:  Same, Fournier's gangrene  Procedure:  Incision and debridement, left buttock  Surgeon:  Aviva Signs, M.D.  Assistant: Tama High, M.D.  Anes:  Gen. endotracheal  Indications:  Patient is a 55 year old black male who presented emergency room with fever, chills, left buttock pain. He buttock abscess. I was called emergently as the patient was septic and hypotensive. CT scan of the pelvis reveals subcutaneous gas and muscle involvement in the left buttock extending up to the perirectal region. The case was discussed with [facility] and general surgery down in [place]. They would prefer that I proceeded with incision and debridement, then transferring the patient intubated down to the coronary ICU. The risks and benefits of the procedure including bleeding, infection, postoperative intubation, and the need for transfer to [facility] after the procedure were fully explained to the patient, who gave informed consent.  Procedure note:  The patient underwent ultrasound-guided right internal jugular central line placement by Dr. Rosana Hoes. This was performed after induction of general endotracheal anesthesia. An arterial line was also started. The patient was then placed in the right lateral decubitus position. Surgical site confirmation was performed.  A longitudinal incision was made over the left buttock towards the midline. The dissection was taken down to the subcutaneous tissue. Aerobic and anaerobic cultures were taken and sent to microbiology. Gangrenous subcutaneous tissue as well as muscle along with liquefaction necrosis was found. Any necrotic tissue was sharply debrided with scissors. Pulse lavage therapy was then performed. A bleeding was controlled using Bovie electrocautery. A whole roll of Kerlex that was impregnated with Betadine was placed  into the wound. A dry sterile dressing was then applied.  All tape and needle counts were correct at the end of the procedure. The patient was left intubated and transferred to PACU in critical but stable condition.  Complications:  None  EBL:  Minimal  Specimen:  Aerobic and anaerobic cultures of left buttock

## 2016-06-05 NOTE — Op Note (Addendum)
SURGICAL PROCEDURE REPORT  DATE OF PROCEDURE: 06/05/2016  ATTENDING Surgeon(s): Vickie Epley, MD  ANESTHESIA: Already intubated   PRE-PROCEDURE DIAGNOSIS: Septic shock, necrotizing fasciitis (icd-10's: M72.6, R65.21)  POST-PROCEDURE DIAGNOSIS: Septic shock, necrotizing fasciitis (icd-10's: M72.6, R65.21  PROCEDURE(S): Insertion of Right IJ triple-lumen central venous catheter (cpt: 28638)  INTRAOPERATIVE FINDINGS: Non-pulsatile venous blood return with clear visualization of venous access vessel on ultrasound   ESTIMATED BLOOD LOSS: Minimal (<20 mL)   SPECIMENS: None   IMPLANTS: Right internal jugular triple-lumen central venous catheter  DRAINS: None   COMPLICATIONS: None apparent   CONDITION AT COMPLETION: Unchanged from pre-procedure   DISPOSITION: Remaining in OR for aggressive surgical debridement of Left buttock - proximal Left thigh necrotizing fasciitis  INDICATIONS FOR PROCEDURE: Patient is a 55 y.o. male who presented to [facility] ED with fever, chills, and left buttock pain. CT revealed subcutaneous gas and muscle involvement in the left buttock extending to the perirectal region. With patient developing septic shock, surgery was consulted, and decision was made in coordination with [facility] surgery and critical care teams to perform aggressive surgical debridement prior to transfer to [facility] for higher level critical care management. Central venous access was indicated for vasopressor infusion and monitoring of patient's volume status. All risks, benefits, and alternatives to the above procedure were discussed with the patient and patient's family, who elected to proceed, and informed consent was accordingly obtained at that time.   DETAILS OF PROCEDURE: Patient was appropriately identified, and following a brief timeout, Right IJ access site was prepped and draped in the usual sterile fashion using chlorhexidine skin prep and patient was placed in Trendelenburg position. Access  site was identified using direct ultrasound visualization. Using standard Seldinger technique, access needle was inserted into the selected access vein with negative pressure applied to the attached syringe, and dark non-pulsatile venous blood was obtained. Loosely attached syringe was disconnected from the access needle, through which guidewire was advanced, over which access needle was withdrawn and dilator was advanced to skin level. A small skin incision was made adjacent to the guidewire/dilator, allowing the dilator to be advanced through subcutaneous tissues and withdrawn over secured guidewire with gentle pressure applied to the access site. Triple-lumen central venous catheter, already flushed with sterile saline, was then easily advanced over the guidewire, confirmed to flush easily, and secured to the patient's skin with 2-0 silk suture. Sterile dressing with biopatch was applied, and all sharp instruments were appropriately disposed. Chest x-ray was ordered and reviewed in PACU with appropriate position of catheter tip and no pneumothorax identified.   Patient tolerated the procedure well, and aggressive surgical debridement of Left buttock and peri-rectal/perineal regions commenced as dictated separately by Dr. Arnoldo Morale.

## 2016-06-05 NOTE — ED Notes (Signed)
Patient can not use restroom will notify staff when he can.

## 2016-06-06 ENCOUNTER — Inpatient Hospital Stay (HOSPITAL_COMMUNITY): Payer: Commercial Managed Care - HMO

## 2016-06-06 DIAGNOSIS — I4891 Unspecified atrial fibrillation: Secondary | ICD-10-CM

## 2016-06-06 LAB — BASIC METABOLIC PANEL
Anion gap: 6 (ref 5–15)
Anion gap: 7 (ref 5–15)
BUN: 11 mg/dL (ref 6–20)
BUN: 9 mg/dL (ref 6–20)
CO2: 22 mmol/L (ref 22–32)
CO2: 23 mmol/L (ref 22–32)
Calcium: 7.5 mg/dL — ABNORMAL LOW (ref 8.9–10.3)
Calcium: 7.6 mg/dL — ABNORMAL LOW (ref 8.9–10.3)
Chloride: 104 mmol/L (ref 101–111)
Chloride: 106 mmol/L (ref 101–111)
Creatinine, Ser: 1.2 mg/dL (ref 0.61–1.24)
Creatinine, Ser: 1.36 mg/dL — ABNORMAL HIGH (ref 0.61–1.24)
GFR calc Af Amer: >60 mL/min (ref >60)
GFR calc Af Amer: >60 mL/min (ref >60)
GFR calc non Af Amer: 57 mL/min — ABNORMAL LOW (ref >60)
GFR calc non Af Amer: >60 mL/min (ref >60)
Glucose, Bld: 142 mg/dL — ABNORMAL HIGH (ref 65–99)
Glucose, Bld: 93 mg/dL (ref 65–99)
Potassium: 4.1 mmol/L (ref 3.5–5.1)
Potassium: 4.9 mmol/L (ref 3.5–5.1)
Sodium: 133 mmol/L — ABNORMAL LOW (ref 135–145)
Sodium: 135 mmol/L (ref 135–145)

## 2016-06-06 LAB — GLUCOSE, CAPILLARY
Glucose-Capillary: 117 mg/dL — ABNORMAL HIGH (ref 65–99)
Glucose-Capillary: 133 mg/dL — ABNORMAL HIGH (ref 65–99)
Glucose-Capillary: 138 mg/dL — ABNORMAL HIGH (ref 65–99)
Glucose-Capillary: 78 mg/dL (ref 65–99)
Glucose-Capillary: 79 mg/dL (ref 65–99)
Glucose-Capillary: 95 mg/dL (ref 65–99)

## 2016-06-06 LAB — CBC WITH DIFFERENTIAL/PLATELET
Basophils Absolute: 0 10*3/uL (ref 0.0–0.1)
Basophils Relative: 0 %
Eosinophils Absolute: 0.1 10*3/uL (ref 0.0–0.7)
Eosinophils Relative: 1 %
HCT: 37.3 % — ABNORMAL LOW (ref 39.0–52.0)
Hemoglobin: 12.5 g/dL — ABNORMAL LOW (ref 13.0–17.0)
Lymphocytes Relative: 6 %
Lymphs Abs: 1 10*3/uL (ref 0.7–4.0)
MCH: 31.9 pg (ref 26.0–34.0)
MCHC: 33.5 g/dL (ref 30.0–36.0)
MCV: 95.2 fL (ref 78.0–100.0)
Monocytes Absolute: 1.9 10*3/uL — ABNORMAL HIGH (ref 0.1–1.0)
Monocytes Relative: 11 %
Neutro Abs: 15 10*3/uL — ABNORMAL HIGH (ref 1.7–7.7)
Neutrophils Relative %: 82 %
Platelets: 177 10*3/uL (ref 150–400)
RBC: 3.92 MIL/uL — ABNORMAL LOW (ref 4.22–5.81)
RDW: 15.4 % (ref 11.5–15.5)
WBC: 18 10*3/uL — ABNORMAL HIGH (ref 4.0–10.5)

## 2016-06-06 LAB — COMPREHENSIVE METABOLIC PANEL
ALT: 25 U/L (ref 17–63)
AST: 35 U/L (ref 15–41)
Albumin: 1.8 g/dL — ABNORMAL LOW (ref 3.5–5.0)
Alkaline Phosphatase: 60 U/L (ref 38–126)
Anion gap: 6 (ref 5–15)
BUN: 10 mg/dL (ref 6–20)
CO2: 23 mmol/L (ref 22–32)
Calcium: 7.5 mg/dL — ABNORMAL LOW (ref 8.9–10.3)
Chloride: 107 mmol/L (ref 101–111)
Creatinine, Ser: 1.33 mg/dL — ABNORMAL HIGH (ref 0.61–1.24)
GFR calc Af Amer: >60 mL/min (ref >60)
GFR calc non Af Amer: 58 mL/min — ABNORMAL LOW (ref >60)
Glucose, Bld: 133 mg/dL — ABNORMAL HIGH (ref 65–99)
Potassium: 4.8 mmol/L (ref 3.5–5.1)
Sodium: 136 mmol/L (ref 135–145)
Total Bilirubin: 0.8 mg/dL (ref 0.3–1.2)
Total Protein: 4.9 g/dL — ABNORMAL LOW (ref 6.5–8.1)

## 2016-06-06 LAB — MAGNESIUM
Magnesium: 1.9 mg/dL (ref 1.7–2.4)
Magnesium: 1.9 mg/dL (ref 1.7–2.4)

## 2016-06-06 LAB — TROPONIN I
Troponin I: 0.58 ng/mL (ref <0.03)
Troponin I: 0.04 ng/mL (ref <0.03)

## 2016-06-06 LAB — PROCALCITONIN: Procalcitonin: 1.8 ng/mL

## 2016-06-06 MED ORDER — INSULIN ASPART 100 UNIT/ML ~~LOC~~ SOLN
2.0000 [IU] | SUBCUTANEOUS | Status: DC
  Administered 2016-06-06 – 2016-06-08 (×3): 2 [IU] via SUBCUTANEOUS

## 2016-06-06 MED ORDER — HEPARIN SODIUM (PORCINE) 5000 UNIT/ML IJ SOLN
5000.0000 [IU] | Freq: Three times a day (TID) | INTRAMUSCULAR | Status: DC
  Administered 2016-06-06 – 2016-06-07 (×3): 5000 [IU] via SUBCUTANEOUS
  Filled 2016-06-06 (×3): qty 1

## 2016-06-06 MED ORDER — NYSTATIN 100000 UNIT/ML MT SUSP
5.0000 mL | Freq: Four times a day (QID) | OROMUCOSAL | Status: DC
  Filled 2016-06-06: qty 5

## 2016-06-06 MED ORDER — MAGNESIUM SULFATE IN D5W 1-5 GM/100ML-% IV SOLN
1.0000 g | Freq: Once | INTRAVENOUS | Status: AC
  Administered 2016-06-06: 1 g via INTRAVENOUS
  Filled 2016-06-06 (×2): qty 100

## 2016-06-06 MED ORDER — DAKINS (1/4 STRENGTH) 0.125 % EX SOLN
Freq: Two times a day (BID) | CUTANEOUS | Status: AC
  Administered 2016-06-06: 22:00:00
  Administered 2016-06-08: 1
  Administered 2016-06-08 (×2)
  Filled 2016-06-06: qty 473

## 2016-06-06 NOTE — Procedures (Signed)
Extubation Procedure Note  Patient Details:   Name: Juan Wheeler DOB: 1960/10/15 MRN: [id number]   Airway Documentation:     Evaluation  O2 sats: stable throughout Complications: No apparent complications Patient did tolerate procedure well. Bilateral Breath Sounds: Clear   Yes   Patient extubated to 2L nasal cannula per MD order.  Positive cuff leak noted.  No evidence of stridor.  Patient able to speak post extubation.  Vitals are stable.  Incentive spirometry performed x5 with achieved goal of 1000.  No apparent complications.  Alphia Moh N 06/06/2016, 12:19 PM

## 2016-06-06 NOTE — Progress Notes (Addendum)
133m of Fentanyl wasted in sink. Witnessed by [employer]

## 2016-06-06 NOTE — Progress Notes (Signed)
CRITICAL VALUE ALERT  Critical value received:  Troponin 0.58  Date of notification:  06/06/2016  Time of notification:  0202  Critical value read back:No.  Nurse who received alert:  Redell Bhandari Thompson  MD notified (1st page):  Ashok Cordia  Time of first page:  0203  MD notified (2nd page):  Time of second page:  Responding MD:  Ashok Cordia  Time MD responded:  [phone]

## 2016-06-06 NOTE — Progress Notes (Signed)
1 Day Post-Op  Subjective: Went into atrial fibrillation.  BP improved from yesterday.    Objective: Vital signs in last 24 hours: Temp:  [97.6 F (36.4 C)-99.8 F (37.7 C)] 99.6 F (37.6 C) (09/09 0813) Pulse Rate:  [37-115] 110 (09/09 0900) Resp:  [0-27] 14 (09/09 0900) BP: (91-191)/(55-107) 118/107 (09/09 0900) SpO2:  [92 %-100 %] 94 % (09/09 0900) Arterial Line BP: (77-223)/(41-105) 99/55 (09/09 0900) FiO2 (%):  [40 %-100 %] 40 % (09/09 0800) Weight:  [90 kg (198 lb 6.6 oz)] 90 kg (198 lb 6.6 oz) (09/09 0134) Last BM Date:  (PTA)  Intake/Output from previous day: 09/08 0701 - 09/09 0700 In: 10227.7 [I.V.:5397.7; IV Piggyback:3950] Out: 9983 [Urine:3040; Blood:150] Intake/Output this shift: Total I/O In: 316.6 [I.V.:236.6; NG/GT:30; IV Piggyback:50] Out: 175 [Urine:175]  General appearance: able to follow commands on vent and sedation Resp: breathing comfortably GI: soft, non tender Incision/Wound: packing removed.  Very foul odor.  no purulent drainage.  no frankly necrotic tissue.  some dusky edges  Lab Results:   Recent Labs  06/05/16 0636 06/05/16 0649 06/06/16 0443  WBC 20.7*  --  18.0*  HGB 15.7 17.0 12.5*  HCT 44.1 50.0 37.3*  PLT 183  --  177   BMET  Recent Labs  06/06/16 0014 06/06/16 0443  NA 135 136  K 4.9 4.8  CL 106 107  CO2 23 23  GLUCOSE 142* 133*  BUN 11 10  CREATININE 1.36* 1.33*  CALCIUM 7.5* 7.5*   PT/INR No results for input(s): LABPROT, INR in the last 72 hours. ABG  Recent Labs  06/05/16 1535 06/05/16 1706  PHART 7.200* 7.285*  HCO3 19.8* 22.7    Studies/Results: Ct Pelvis W Contrast  Result Date: 06/05/2016 CLINICAL DATA:  Left-sided buttock abscess for 1 week EXAM: CT PELVIS WITH CONTRAST TECHNIQUE: Multidetector CT imaging of the pelvis was performed using the standard protocol following the bolus administration of intravenous contrast. CONTRAST:  163m ISOVUE-300 IOPAMIDOL (ISOVUE-300) INJECTION 61% COMPARISON:   None. FINDINGS: Mild aortoiliac calcifications are identified. The bladder is well distended. No pelvic abnormality is seen. There are changes consistent with significant inflammatory change in the left buttock with a large collection of air which extends deep to the gluteal muscles into the pelvis adjacent to the distal rectum. The air also extends into the substance of the gluteal muscles and into the posterior aspect of the upper thigh in the subcutaneous fat. No demonstrative fluid collection is identified to allow for focal drainage. Diffuse subcutaneous edema is noted consistent with localized cellulitis. IMPRESSION: Changes consistent with significant cellulitis in the left buttock with diffuse irregular air collection medially within the buttock and extending into the gluteal muscles and posterior upper left thigh as well as into the pelvic cavity adjacent to the distal rectum. Greatest transverse dimension is approximately 14 cm. Greatest craniocaudad dimensions are approximately 12 cm. No definitive fluid component is identified at this time. Electronically Signed   By: MInez CatalinaM.D.   On: 06/05/2016 07:58   Portable Chest Xray  Result Date: 06/06/2016 CLINICAL DATA:  Acute respiratory failure. EXAM: PORTABLE CHEST 1 VIEW COMPARISON:  Radiograph of June 05, 2016. FINDINGS: Endotracheal and nasogastric tubes are unchanged in position. Right internal jugular catheter is unchanged. No pneumothorax is noted. Mild left basilar subsegmental atelectasis is noted. Stable right basilar opacity is noted concerning for atelectasis or pneumonia with associated pleural effusion. Bony thorax is unremarkable. IMPRESSION: Stable support apparatus. Mild left basilar subsegmental atelectasis. Stable right  basilar atelectasis or pneumonia with associated pleural effusion. Electronically Signed   By: Marijo Conception, M.D.   On: 06/06/2016 09:18   Dg Chest Port 1 View  Result Date: 06/05/2016 CLINICAL DATA:   Acute respiratory failure. EXAM: PORTABLE CHEST 1 VIEW COMPARISON:  Chest radiograph June 05, 2016 at 1123 hours FINDINGS: Patient rotated to the RIGHT. Lung bases incompletely imaged. Endotracheal tube tip projects 4.3 cm above the carina. RIGHT internal jugular central venous catheter projecting cavoatrial junction. Nasogastric tube past distal esophagus, distal tip not imaged. Worsening RIGHT lung volume loss with consolidation and at least small pleural effusion. LEFT lung base strandy densities consistent with atelectasis. Diffuse mild interstitial prominence. Cardiac silhouette is normal in size. No pneumothorax. Soft tissue planes and included osseous structures are unchanged. IMPRESSION: Worsening RIGHT lung consolidation and atelectasis and small pleural effusion concerning for underlying pneumonia. Increasing interstitial prominence, possibly infectious. Stable appearance of life-support lines. Electronically Signed   By: Elon Alas M.D.   On: 06/05/2016 15:25   Dg Chest Port 1 View  Result Date: 06/05/2016 CLINICAL DATA:  Central line placement. EXAM: PORTABLE CHEST 1 VIEW COMPARISON:  Radiograph of same day. FINDINGS: Stable cardiomediastinal silhouette. Endotracheal tube is seen over tracheal air shadow with distal tip 5 cm above the carina. Nasogastric tube is noted, although distal tip is not well visualized. Interval placement of right internal jugular catheter with distal tip in expected position of cavoatrial junction. Mild central pulmonary vascular congestion is noted with probable left perihilar edema. Significantly increased opacities are noted a right midlung area concerning for atelectasis or possibly inflammation. No pneumothorax is noted. IMPRESSION: Endotracheal tube in grossly good position. Interval placement of right internal jugular catheter with distal tip in grossly good position. No pneumothorax is noted. Nasogastric tube is noted as well, but distal tip is not well  visualized on this study. Probable mild left perihilar edema is noted. Interval development of significant right midlung opacities are noted concerning for atelectasis or possibly pneumonia. Electronically Signed   By: Marijo Conception, M.D.   On: 06/05/2016 11:39   Dg Chest Portable 1 View  Result Date: 06/05/2016 CLINICAL DATA:  Hypoxia, low BP now, hypertension, history of rectal and colon cancer. Smoker 1/4 pack per day EXAM: PORTABLE CHEST 1 VIEW COMPARISON:  CT thorax 12/25/2015, chest x-ray 03/28/2013 FINDINGS: The patient is rotated. There are streaky bibasilar left greater than right opacities, likely reflecting atelectasis. Cardiomediastinal silhouette nonenlarged allowing for rotation. Pulmonary vascularity is upper normal. No overt edema. No pneumothorax. IMPRESSION: 1. Mild streaky bibasilar opacities likely reflecting atelectasis. Electronically Signed   By: Donavan Foil M.D.   On: 06/05/2016 08:44    Anti-infectives: Anti-infectives    Start     Dose/Rate Route Frequency Ordered Stop   06/05/16 2200  piperacillin-tazobactam (ZOSYN) IVPB 3.375 g     3.375 g 12.5 mL/hr over 240 Minutes Intravenous Every 8 hours 06/05/16 1602     06/05/16 1800  clindamycin (CLEOCIN) IVPB 600 mg     600 mg 100 mL/hr over 30 Minutes Intravenous Every 6 hours 06/05/16 1453     06/05/16 1630  vancomycin (VANCOCIN) IVPB 750 mg/150 ml premix     750 mg 150 mL/hr over 60 Minutes Intravenous Every 12 hours 06/05/16 1605     06/05/16 1615  piperacillin-tazobactam (ZOSYN) IVPB 3.375 g     3.375 g 100 mL/hr over 30 Minutes Intravenous  Once 06/05/16 1601 06/05/16 1744   06/05/16 1400  piperacillin-tazobactam (ZOSYN)  IVPB 3.375 g  Status:  Discontinued     3.375 g 12.5 mL/hr over 240 Minutes Intravenous Every 8 hours 06/05/16 0937 06/05/16 1553   06/05/16 0800  clindamycin (CLEOCIN) IVPB 600 mg     600 mg 100 mL/hr over 30 Minutes Intravenous  Once 06/05/16 0759 06/05/16 0846   06/05/16 0645  vancomycin  (VANCOCIN) IVPB 1000 mg/200 mL premix     1,000 mg 200 mL/hr over 60 Minutes Intravenous  Once 06/05/16 0637 06/05/16 0846   06/05/16 0645  piperacillin-tazobactam (ZOSYN) IVPB 3.375 g     3.375 g 12.5 mL/hr over 240 Minutes Intravenous  Once 06/05/16 0063 06/05/16 0825      Assessment/Plan: s/p Procedure(s): INCISION AND DRAINAGE ABSCESS (Left) continue antibiotics  Pack BID with dakins soln.     LOS: 1 day    [facility] 06/06/2016

## 2016-06-06 NOTE — Progress Notes (Signed)
  Echocardiogram 2D Echocardiogram has been performed.  Juan Wheeler 06/06/2016, 5:43 PM

## 2016-06-06 NOTE — Progress Notes (Addendum)
[place] Progress Note Patient Name: Juan Wheeler DOB: Jan 21, 1961 MRN: [id number]   Date of Service  06/06/2016  HPI/Events of Note  Extubated earlier. Able to swallow water without issues/problems.  eICU Interventions  Will order clear liquid diet.     Intervention Category Intermediate Interventions: Other:  Lysle Dingwall 06/06/2016, 3:54 PM

## 2016-06-06 NOTE — Progress Notes (Signed)
PULMONARY / CRITICAL CARE MEDICINE   Name: Juan Wheeler MRN: [id number] DOB: [birth year]    ADMISSION DATE:  06/05/2016 CONSULTATION DATE:  06/05/16  REFERRING MD:  Dr. Arnoldo Morale / [facility]  CHIEF COMPLAINT:  Buttock abscess  HISTORY OF PRESENT ILLNESS:   55 y/o M with a past medical history of chronic low back pain, CAD (05/2015 EF 54% by stress test), HTN, LV dysfunction, PAD, DVT (2013, no longer on any coagulation), known pulmonary nodules colon cancer & rectal adenocarcinoma treated with oral chemotherapy, LAR with diverting loop ileostomy, subsequent ileostomy closure (2012 at [facility]) who presented to the [facility] ER with complaints of left buttock pain.  The patient had been seen by his primary care physician and referred to the ER for further evaluation. Per chart review he denied fevers, chills, nausea, vomiting, diarrhea, bloody stools/melena. He was seen at [facility] on 9/5 by oncology and started on Bactrim for possible prostatitis.  Symptoms progressed and he sought care 9/8 in the ER.  ER exam reflects entire left buttock erythematous, indurated, warm and tender to palpation. He was found to be hypotensive with systolic BP into the 62G and febrile with a tmax of 103.6.  Initial labs- NA 131, K4.3, CL 100, CO2 22, serum creatinine 2.08, glucose 136, alkaline phosphatase 73, albumin 2.8, total bilirubin 1.4, PCT 1.06, lactic acid 2.54, WBC 20.7, hemoglobin 15.7 and platelets 183. Gen. surgery (Dr. Arnoldo Morale) was consulted. Clindamycin, vancomycin and Zosyn administered in the ER. Aggressive volume resuscitation with 4 L normal saline. CT pelvis obtained which demonstrated changes consistent with significant cellulitis in the left buttock with diffuse irregular air collection medially within the buttock extending into the gluteal muscles and posterior upper left thigh/pelvic cavity. No definitive fluid component identified at that time. Decision was made for the patient to be taken to the OR  for drainage of left buttock abscess given concerns of gangrene.  Post OR, the patient was transferred to [facility] for further evaluation.  PAST MEDICAL HISTORY :  He  has a past medical history of Chronic lower back pain; Colon cancer ([place]); Coronary artery disease; DVT (deep venous thrombosis) ([place]) (~ 2013); GERD (gastroesophageal reflux disease); History of blood transfusion; Hypertension; LV dysfunction; PAD (peripheral artery disease) ([place]); Pulmonary nodules (09/30/2014); Rectal cancer ([place]); and Tobacco abuse.  PAST SURGICAL HISTORY: He  has a past surgical history that includes Colostomy; Abdominal surgery (1990's); Coronary angioplasty with stent (04/17/2013); Inguinal hernia repair (Bilateral, 1990's); Colostomy takedown (2013); Colectomy (2012); left heart catheterization with coronary angiogram (N/A, 04/17/2013); percutaneous stent intervention (04/17/2013); Cardiac catheterization (N/A, 06/14/2015); Cardiac catheterization (Bilateral, 06/14/2015); Femoral-popliteal Bypass Graft (Left, 07/02/2015); and Vein harvest (Left, 07/02/2015).  Allergies  Allergen Reactions  . Darvocet [Propoxyphene N-Acetaminophen] Palpitations  . Propoxyphene Palpitations    No current facility-administered medications on file prior to encounter.    Current Outpatient Prescriptions on File Prior to Encounter  Medication Sig  . aspirin EC 81 MG tablet Take 81 mg by mouth daily.  . nitroGLYCERIN (NITROSTAT) 0.4 MG SL tablet PLACE 1 TABLET UNDER TONGUE IF NEEDED FOR CHEST PAIN..UP TO 3 DOSES  . amLODipine (NORVASC) 5 MG tablet Take 1 tablet (5 mg total) by mouth daily.  . baclofen (LIORESAL) 10 MG tablet Take 10 mg by mouth 2 (two) times daily. Reported on 12/27/2015  . isosorbide mononitrate (IMDUR) 30 MG 24 hr tablet TAKE 1/2 TABLET BY MOUTH ONCE DAILY. (Patient taking differently: Take 15 mg by mouth daily. )  . lisinopril (  PRINIVIL,ZESTRIL) 40 MG tablet Take 40 mg by mouth daily.   Marland Kitchen lovastatin  (MEVACOR) 10 MG tablet Take 1 tablet (10 mg total) by mouth at bedtime.  Marland Kitchen morphine (MS CONTIN) 60 MG 12 hr tablet Take 1 tablet (60 mg total) by mouth every 8 (eight) hours. Take 1 tablet every 8 hours.  Marland Kitchen omeprazole (PRILOSEC) 40 MG capsule Take 40 mg by mouth daily.  . Oxycodone HCl 10 MG TABS Take 1 or 2 tablets every 4 hours as needed for pain.. (Patient taking differently: Take 10-20 mg by mouth See admin instructions. Every four to six hours as needed for pain)  . sulfamethoxazole-trimethoprim (BACTRIM DS,SEPTRA DS) 800-160 MG tablet Take 1 tablet by mouth 2 (two) times daily.    FAMILY HISTORY:  His indicated that his mother is alive. He indicated that his father is deceased. He indicated that the status of his brother is unknown.    SOCIAL HISTORY: He  reports that he has been smoking Cigarettes.  He started smoking about 42 years ago. He has a 10.00 pack-year smoking history. He has never used smokeless tobacco. He reports that he drinks about 4.2 oz of alcohol per week . He reports that he uses drugs, including Marijuana.  REVIEW OF SYSTEMS:   Unable to complete with patient due to mechanical ventilation.  SUBJECTIVE:    VITAL SIGNS: BP 103/67   Pulse (!) 102   Temp 99.6 F (37.6 C) (Oral)   Resp 11   Ht '5\' 9"'$  (1.753 m)   Wt 90 kg (198 lb 6.6 oz)   SpO2 95%   BMI 29.30 kg/m   HEMODYNAMICS: CVP:  [6 mmHg-12 mmHg] 6 mmHg  VENTILATOR SETTINGS: Vent Mode: PSV;CPAP FiO2 (%):  [40 %-100 %] 40 % Set Rate:  [14 bmp-22 bmp] 22 bmp Vt Set:  [500 mL-550 mL] 550 mL PEEP:  [5 cmH20] 5 cmH20 Pressure Support:  [5 cmH20] 5 cmH20 Plateau Pressure:  [15 cmH20-28 cmH20] 16 cmH20  INTAKE / OUTPUT: I/O last 3 completed shifts: In: 10227.7 [I.V.:5397.7; Other:880; IV Piggyback:3950] Out: 7939 [Urine:3040; Blood:150]  PHYSICAL EXAMINATION: General:  Well-developed adult male in no acute distress on mechanical vent Neuro:  [place] HEENT:  MM pink/moist, ETT in place, no  jvd Cardiovascular:  S1s2 rrr, no m/r/g  Lungs:  shallow/non-labored on vent, lungs bilaterally coarse  Abdomen:  Soft, non-tender, bsx4 active Musculoskeletal:  No acute deformities  Skin:  Warm/dry, no edema   LABS:  BMET  Recent Labs Lab 06/05/16 0636 06/05/16 0649 06/06/16 0014 06/06/16 0443  NA 131* 133* 135 136  K 4.3 4.3 4.9 4.8  CL 100* 98* 106 107  CO2 22  --  23 23  BUN 22* 21* 11 10  CREATININE 2.08* 2.00* 1.36* 1.33*  GLUCOSE 136* 136* 142* 133*    Electrolytes  Recent Labs Lab 06/05/16 0636 06/06/16 0014 06/06/16 0443  CALCIUM 8.4* 7.5* 7.5*  MG  --  1.9  --     CBC  Recent Labs Lab 06/02/16 0958 06/05/16 0636 06/05/16 0649 06/06/16 0443  WBC 21.0* 20.7*  --  18.0*  HGB 17.7* 15.7 17.0 12.5*  HCT 50.2 44.1 50.0 37.3*  PLT 206 183  --  177    Coag's No results for input(s): APTT, INR in the last 168 hours.  Sepsis Markers  Recent Labs Lab 06/05/16 0636 06/05/16 0649 06/05/16 2230 06/05/16 2305 06/06/16 0443  LATICACIDVEN  --  2.57*  --  1.2  --   PROCALCITON  1.06  --  1.70  --  1.80    ABG  Recent Labs Lab 06/05/16 1535 06/05/16 1706  PHART 7.200* 7.285*  PCO2ART 50.7* 47.8  PO2ART 69.0* 161.0*    Liver Enzymes  Recent Labs Lab 06/02/16 0958 06/05/16 0636 06/06/16 0443  AST 16 40 35  ALT 13* 26 25  ALKPHOS 70 73 60  BILITOT 1.3* 1.4* 0.8  ALBUMIN 3.5 2.8* 1.8*    Cardiac Enzymes  Recent Labs Lab 06/06/16 0014 06/06/16 0930  TROPONINI 0.58* 0.04*    Glucose  Recent Labs Lab 06/05/16 1538 06/05/16 2002 06/06/16 0002 06/06/16 0324 06/06/16 0817  GLUCAP 134* 113* 133* 117* 138*    Imaging Portable Chest Xray  Result Date: 06/06/2016 CLINICAL DATA:  Acute respiratory failure. EXAM: PORTABLE CHEST 1 VIEW COMPARISON:  Radiograph of June 05, 2016. FINDINGS: Endotracheal and nasogastric tubes are unchanged in position. Right internal jugular catheter is unchanged. No pneumothorax is noted. Mild  left basilar subsegmental atelectasis is noted. Stable right basilar opacity is noted concerning for atelectasis or pneumonia with associated pleural effusion. Bony thorax is unremarkable. IMPRESSION: Stable support apparatus. Mild left basilar subsegmental atelectasis. Stable right basilar atelectasis or pneumonia with associated pleural effusion. Electronically Signed   By: Marijo Conception, M.D.   On: 06/06/2016 09:18   Dg Chest Port 1 View  Result Date: 06/05/2016 CLINICAL DATA:  Acute respiratory failure. EXAM: PORTABLE CHEST 1 VIEW COMPARISON:  Chest radiograph June 05, 2016 at 1123 hours FINDINGS: Patient rotated to the RIGHT. Lung bases incompletely imaged. Endotracheal tube tip projects 4.3 cm above the carina. RIGHT internal jugular central venous catheter projecting cavoatrial junction. Nasogastric tube past distal esophagus, distal tip not imaged. Worsening RIGHT lung volume loss with consolidation and at least small pleural effusion. LEFT lung base strandy densities consistent with atelectasis. Diffuse mild interstitial prominence. Cardiac silhouette is normal in size. No pneumothorax. Soft tissue planes and included osseous structures are unchanged. IMPRESSION: Worsening RIGHT lung consolidation and atelectasis and small pleural effusion concerning for underlying pneumonia. Increasing interstitial prominence, possibly infectious. Stable appearance of life-support lines. Electronically Signed   By: Elon Alas M.D.   On: 06/05/2016 15:25     STUDIES:  9/8 CT Pelvis >> CT pelvis obtained which demonstrated changes consistent with significant cellulitis in the left buttock with diffuse irregular air collection medially within the buttock extending into the gluteal muscles and posterior upper left thigh/pelvic cavity. No definitive fluid component identified at that time.  CULTURES: Sputum 9/8 >>  Buttock Abscess 9/8 >>  BCx2 9/8 >> UC 9/8 >>   ANTIBIOTICS: Zosyn 9/8 >>  Vanco  9/8 >>  Clinda 9/8 >>   SIGNIFICANT EVENTS: 9/08  Admit with left buttock abscess, concern for necrotizing fasciitis   LINES/TUBES: ETT 9/8 >>  L IJ CVC 9/8 >>   DISCUSSION: 55 year old male admitted 9/8 with a left buttock abscess concerning for necrotizing fasciitis status post surgical I&D. Transferred to [facility] 9/8 for further evaluation.  ASSESSMENT / PLAN:  PULMONARY A: RLL asp pna.  Doing well on PST Denies h/o copd or osa.  P:   Doing well on PST. Good TV.  Plan to extubate. May need Bipap prn  CARDIOVASCULAR A:  Sepsis - in setting of left buttock abscess Hypotension - secondary to above, resolved with NS. Improved.  P:  Trend CVP Q4  NS @ 75 ml/hr Levophed at 7 mcg/kg/min now > plan to wean off. Lactate improved.   RENAL A:  AKI - in setting of sepsis Mild elevation of Lactic Acid. Improved.  P:   Trend BMP / UOP  Replace electrolytes as indicated   GASTROINTESTINAL A:   Left Buttock Abscess - s/p I&D of 9/8 Hx of Adenocarcinoma of Rectum - 2013 P:   NPO  T  HEMATOLOGIC A:   Leukocytosis - in setting of left buttock abscess  P:  See ID  Trend CBC  SCD's for DVT prophylaxis   INFECTIOUS A:   Left Buttock Abscess s/p I&D  Severe Sepsis - secondary to above  P:   ABX as above  Monitor cultures  Appreciate CCS input, no plans for surgery now.    ENDOCRINE A:   At Risk Hyper / Hypoglycemia   P:   Monitor glucose on BMP   NEUROLOGIC A:   Sedation needs while on vent  P:   RASS goal: 0 - -1 Takes opiates at [facility]oe for pain. May need prn dilaudid once extubated. Will be cautious 2/2 hypercapnea.    FAMILY  - Updates: Family updated at bedside per Dr. Corrie Dandy  - Inter-disciplinary family meet or Palliative Care meeting due by: 9/15  Critical care time with this patient today : 30 minutes.   Monica Becton, MD 06/06/2016, 11:47 AM [place] [facility] Pager [phone] After 3 pm or if no  answer, call [phone]

## 2016-06-06 NOTE — Progress Notes (Signed)
[place] Progress Note Patient Name: Arlando Leisinger DOB: May 16, 1961 MRN: [id number]   Date of Service  06/06/2016  HPI/Events of Note  Intermittent elevation of HR into 170's. Now NSR with rate = 97.  eICU Interventions  Will order: 1. 12 Lead EKG now.  2. BMP and Mg++ level now.      Intervention Category Major Interventions: Arrhythmia - evaluation and management  Alfonza Toft Eugene 06/06/2016, 10:50 PM

## 2016-06-07 ENCOUNTER — Inpatient Hospital Stay (HOSPITAL_COMMUNITY): Payer: Commercial Managed Care - HMO | Admitting: Anesthesiology

## 2016-06-07 ENCOUNTER — Encounter (HOSPITAL_COMMUNITY): Payer: Self-pay | Admitting: Anesthesiology

## 2016-06-07 ENCOUNTER — Encounter (HOSPITAL_COMMUNITY)
Admission: EM | Disposition: A | Discharge status: Skilled Nursing Facility | Payer: Self-pay | Source: Home / Self Care | Attending: Internal Medicine

## 2016-06-07 DIAGNOSIS — I10 Essential (primary) hypertension: Secondary | ICD-10-CM

## 2016-06-07 DIAGNOSIS — L0291 Cutaneous abscess, unspecified: Secondary | ICD-10-CM

## 2016-06-07 DIAGNOSIS — J9601 Acute respiratory failure with hypoxia: Secondary | ICD-10-CM

## 2016-06-07 HISTORY — PX: INCISION AND DRAINAGE PERIRECTAL ABSCESS: SHX1804

## 2016-06-07 LAB — GLUCOSE, CAPILLARY
Glucose-Capillary: 100 mg/dL — ABNORMAL HIGH (ref 65–99)
Glucose-Capillary: 105 mg/dL — ABNORMAL HIGH (ref 65–99)
Glucose-Capillary: 125 mg/dL — ABNORMAL HIGH (ref 65–99)
Glucose-Capillary: 83 mg/dL (ref 65–99)
Glucose-Capillary: 91 mg/dL (ref 65–99)
Glucose-Capillary: 92 mg/dL (ref 65–99)
Glucose-Capillary: 98 mg/dL (ref 65–99)

## 2016-06-07 LAB — ECHOCARDIOGRAM COMPLETE
Height: 69 in
Weight: 3174.62 oz

## 2016-06-07 LAB — PROCALCITONIN: Procalcitonin: 1.22 ng/mL

## 2016-06-07 SURGERY — INCISION AND DRAINAGE, ABSCESS, PERIRECTAL
Anesthesia: General | Laterality: Left

## 2016-06-07 MED ORDER — FENTANYL CITRATE (PF) 100 MCG/2ML IJ SOLN
INTRAMUSCULAR | Status: AC
  Filled 2016-06-07: qty 2

## 2016-06-07 MED ORDER — MIDAZOLAM HCL 2 MG/2ML IJ SOLN
INTRAMUSCULAR | Status: AC
  Filled 2016-06-07: qty 2

## 2016-06-07 MED ORDER — LACTATED RINGERS IV SOLN
INTRAVENOUS | Status: DC | PRN
  Administered 2016-06-07: 10:00:00 via INTRAVENOUS

## 2016-06-07 MED ORDER — SUCCINYLCHOLINE CHLORIDE 200 MG/10ML IV SOSY
PREFILLED_SYRINGE | INTRAVENOUS | Status: AC
  Filled 2016-06-07: qty 10

## 2016-06-07 MED ORDER — HYDROMORPHONE HCL 1 MG/ML IJ SOLN
INTRAMUSCULAR | Status: AC
  Administered 2016-06-07: 0.5 mg via INTRAVENOUS
  Filled 2016-06-07: qty 1

## 2016-06-07 MED ORDER — PROPOFOL 10 MG/ML IV BOLUS
INTRAVENOUS | Status: DC | PRN
  Administered 2016-06-07: 80 mg via INTRAVENOUS
  Administered 2016-06-07: 120 mg via INTRAVENOUS

## 2016-06-07 MED ORDER — HYDROMORPHONE HCL 1 MG/ML IJ SOLN
0.2500 mg | INTRAMUSCULAR | Status: DC | PRN
  Administered 2016-06-07 (×2): 0.5 mg via INTRAVENOUS

## 2016-06-07 MED ORDER — HEPARIN (PORCINE) IN NACL 100-0.45 UNIT/ML-% IJ SOLN
1300.0000 [IU]/h | INTRAMUSCULAR | Status: DC
  Administered 2016-06-07: 1150 [IU]/h via INTRAVENOUS
  Filled 2016-06-07 (×2): qty 250

## 2016-06-07 MED ORDER — MAGNESIUM SULFATE IN D5W 1-5 GM/100ML-% IV SOLN
1.0000 g | Freq: Once | INTRAVENOUS | Status: AC
  Administered 2016-06-07: 1 g via INTRAVENOUS
  Filled 2016-06-07: qty 100

## 2016-06-07 MED ORDER — EPHEDRINE 5 MG/ML INJ
INTRAVENOUS | Status: AC
  Filled 2016-06-07: qty 10

## 2016-06-07 MED ORDER — SUCCINYLCHOLINE CHLORIDE 20 MG/ML IJ SOLN
INTRAMUSCULAR | Status: DC | PRN
  Administered 2016-06-07: 110 mg via INTRAVENOUS

## 2016-06-07 MED ORDER — MORPHINE SULFATE (PF) 2 MG/ML IV SOLN
1.0000 mg | INTRAVENOUS | Status: DC | PRN
  Administered 2016-06-07 – 2016-06-08 (×4): 2 mg via INTRAVENOUS
  Administered 2016-06-09 (×4): 4 mg via INTRAVENOUS
  Administered 2016-06-10: 2 mg via INTRAVENOUS
  Administered 2016-06-10 (×2): 4 mg via INTRAVENOUS
  Administered 2016-06-10: 2 mg via INTRAVENOUS
  Filled 2016-06-07 (×5): qty 1
  Filled 2016-06-07: qty 2
  Filled 2016-06-07: qty 1
  Filled 2016-06-07 (×2): qty 2
  Filled 2016-06-07: qty 1
  Filled 2016-06-07: qty 2
  Filled 2016-06-07: qty 1
  Filled 2016-06-07: qty 2

## 2016-06-07 MED ORDER — FENTANYL CITRATE (PF) 100 MCG/2ML IJ SOLN
25.0000 ug | INTRAMUSCULAR | Status: DC | PRN
  Administered 2016-06-07: 75 ug via INTRAVENOUS
  Filled 2016-06-07: qty 2

## 2016-06-07 MED ORDER — OXYCODONE HCL 5 MG PO TABS
5.0000 mg | ORAL_TABLET | ORAL | Status: DC | PRN
  Administered 2016-06-07 – 2016-06-16 (×23): 10 mg via ORAL
  Filled 2016-06-07 (×24): qty 2

## 2016-06-07 MED ORDER — HEPARIN (PORCINE) IN NACL 100-0.45 UNIT/ML-% IJ SOLN
1150.0000 [IU]/h | INTRAMUSCULAR | Status: DC
  Filled 2016-06-07 (×2): qty 250

## 2016-06-07 MED ORDER — LACTATED RINGERS IV SOLN: INTRAVENOUS | Status: DC

## 2016-06-07 MED ORDER — FENTANYL CITRATE (PF) 100 MCG/2ML IJ SOLN
INTRAMUSCULAR | Status: AC
  Filled 2016-06-07: qty 4

## 2016-06-07 MED ORDER — HYDRALAZINE HCL 20 MG/ML IJ SOLN
10.0000 mg | INTRAMUSCULAR | Status: DC | PRN
  Administered 2016-06-07: 10 mg via INTRAVENOUS
  Filled 2016-06-07: qty 1

## 2016-06-07 MED ORDER — PROMETHAZINE HCL 25 MG/ML IJ SOLN: 6.2500 mg | INTRAMUSCULAR | Status: DC | PRN

## 2016-06-07 MED ORDER — PHENYLEPHRINE HCL 10 MG/ML IJ SOLN
INTRAMUSCULAR | Status: DC | PRN
  Administered 2016-06-07 (×2): 80 ug via INTRAVENOUS

## 2016-06-07 MED ORDER — FENTANYL CITRATE (PF) 100 MCG/2ML IJ SOLN: 25.0000 ug | INTRAMUSCULAR | Status: DC | PRN

## 2016-06-07 MED ORDER — HEPARIN BOLUS VIA INFUSION
4000.0000 [IU] | Freq: Once | INTRAVENOUS | Status: DC
  Filled 2016-06-07: qty 4000

## 2016-06-07 MED ORDER — PROPOFOL 10 MG/ML IV BOLUS
INTRAVENOUS | Status: AC
  Filled 2016-06-07: qty 20

## 2016-06-07 MED ORDER — AMLODIPINE BESYLATE 10 MG PO TABS
10.0000 mg | ORAL_TABLET | Freq: Every day | ORAL | Status: DC
  Administered 2016-06-08 – 2016-06-09 (×2): 10 mg via ORAL
  Filled 2016-06-07 (×3): qty 1

## 2016-06-07 MED ORDER — 0.9 % SODIUM CHLORIDE (POUR BTL) OPTIME
TOPICAL | Status: DC | PRN
  Administered 2016-06-07: 3000 mL

## 2016-06-07 MED ORDER — ONDANSETRON HCL 4 MG/2ML IJ SOLN
INTRAMUSCULAR | Status: DC | PRN
  Administered 2016-06-07: 4 mg via INTRAVENOUS

## 2016-06-07 MED ORDER — ISOSORBIDE MONONITRATE ER 30 MG PO TB24
15.0000 mg | ORAL_TABLET | Freq: Every day | ORAL | Status: DC
  Administered 2016-06-08 – 2016-06-16 (×9): 15 mg via ORAL
  Filled 2016-06-07 (×10): qty 1

## 2016-06-07 MED ORDER — PHENYLEPHRINE 40 MCG/ML (10ML) SYRINGE FOR IV PUSH (FOR BLOOD PRESSURE SUPPORT)
PREFILLED_SYRINGE | INTRAVENOUS | Status: AC
  Filled 2016-06-07: qty 10

## 2016-06-07 MED ORDER — EPHEDRINE SULFATE 50 MG/ML IJ SOLN
INTRAMUSCULAR | Status: DC | PRN
  Administered 2016-06-07: 10 mg via INTRAVENOUS

## 2016-06-07 MED ORDER — ONDANSETRON HCL 4 MG/2ML IJ SOLN
INTRAMUSCULAR | Status: AC
  Filled 2016-06-07: qty 2

## 2016-06-07 MED ORDER — MEPERIDINE HCL 25 MG/ML IJ SOLN: 6.2500 mg | INTRAMUSCULAR | Status: DC | PRN

## 2016-06-07 SURGICAL SUPPLY — 32 items
CANISTER SUCTION 2500CC (MISCELLANEOUS) ×2 IMPLANT
COVER SURGICAL LIGHT HANDLE (MISCELLANEOUS) ×2 IMPLANT
DRAPE LAPAROTOMY T 98X78 PEDS (DRAPES) ×2 IMPLANT
DRAPE UTILITY XL STRL (DRAPES) IMPLANT
DRSG PAD ABDOMINAL 8X10 ST (GAUZE/BANDAGES/DRESSINGS) ×2 IMPLANT
ELECT CAUTERY BLADE 6.4 (BLADE) IMPLANT
ELECT REM PT RETURN 9FT ADLT (ELECTROSURGICAL) ×2
ELECTRODE REM PT RTRN 9FT ADLT (ELECTROSURGICAL) ×1 IMPLANT
GAUZE IODOFORM PACK 1/2 7832 (GAUZE/BANDAGES/DRESSINGS) IMPLANT
GAUZE PACKING FOLDED 1/2 STR (GAUZE/BANDAGES/DRESSINGS) ×2 IMPLANT
GAUZE SPONGE 4X4 12PLY STRL (GAUZE/BANDAGES/DRESSINGS) ×2 IMPLANT
GLOVE BIOGEL M STRL SZ7.5 (GLOVE) ×2 IMPLANT
GLOVE BIOGEL PI IND STRL 8 (GLOVE) ×1 IMPLANT
GLOVE BIOGEL PI INDICATOR 8 (GLOVE) ×1
GOWN STRL REUS W/ TWL LRG LVL3 (GOWN DISPOSABLE) ×1 IMPLANT
GOWN STRL REUS W/TWL 2XL LVL3 (GOWN DISPOSABLE) ×2 IMPLANT
GOWN STRL REUS W/TWL LRG LVL3 (GOWN DISPOSABLE) ×1
KIT BASIN OR (CUSTOM PROCEDURE TRAY) ×2 IMPLANT
KIT ROOM TURNOVER OR (KITS) ×2 IMPLANT
NS IRRIG 1000ML POUR BTL (IV SOLUTION) ×2 IMPLANT
PACK LITHOTOMY IV (CUSTOM PROCEDURE TRAY) IMPLANT
PAD ARMBOARD 7.5X6 YLW CONV (MISCELLANEOUS) ×2 IMPLANT
PENCIL BUTTON HOLSTER BLD 10FT (ELECTRODE) ×2 IMPLANT
SPONGE GAUZE 4X4 12PLY STER LF (GAUZE/BANDAGES/DRESSINGS) ×2 IMPLANT
SPONGE LAP 18X18 X RAY DECT (DISPOSABLE) ×2 IMPLANT
SWAB COLLECTION DEVICE MRSA (MISCELLANEOUS) IMPLANT
TOWEL OR 17X24 6PK STRL BLUE (TOWEL DISPOSABLE) ×2 IMPLANT
TOWEL OR 17X26 10 PK STRL BLUE (TOWEL DISPOSABLE) ×2 IMPLANT
TUBE ANAEROBIC SPECIMEN COL (MISCELLANEOUS) IMPLANT
TUBE CONNECTING 12X1/4 (SUCTIONS) ×2 IMPLANT
UNDERPAD 30X30 (UNDERPADS AND DIAPERS) ×2 IMPLANT
YANKAUER SUCT BULB TIP NO VENT (SUCTIONS) ×2 IMPLANT

## 2016-06-07 NOTE — Progress Notes (Signed)
ANTICOAGULATION CONSULT NOTE - Initial Consult  Pharmacy Consult for Heparin Indication: atrial fibrillation  Allergies  Allergen Reactions  . Darvocet [Propoxyphene N-Acetaminophen] Palpitations  . Propoxyphene Palpitations    Patient Measurements: Height: '5\' 9"'$  (175.3 cm) Weight: 199 lb 1.2 oz (90.3 kg) IBW/kg (Calculated) : 70.7 Heparin Dosing Weight: 81 kg  Vital Signs: Temp: 98.2 F (36.8 C) (09/10 1130) Temp Source: Oral (09/10 0822) BP: 119/79 (09/10 1130) Pulse Rate: 111 (09/10 1130)  Labs:  Recent Labs  06/05/16 0636 06/05/16 0649 06/06/16 0014 06/06/16 0443 06/06/16 0930 06/06/16 2309  HGB 15.7 17.0  --  12.5*  --   --   HCT 44.1 50.0  --  37.3*  --   --   PLT 183  --   --  177  --   --   CREATININE 2.08* 2.00* 1.36* 1.33*  --  1.20  TROPONINI  --   --  0.58*  --  0.04*  --     Estimated Creatinine Clearance: 76.3 mL/min (by C-G formula based on SCr of 1.2 mg/dL).  Assessment: 55 year old male admitted 9/8 with a left buttock abscess concerning for necrotizing fasciitis status post surgical I&D. Now with afib, pharmacy is consulted to start IV heparin. Hgb 12.5, pltc 177K  Patient was taken to OR this am d/t increasing necrotic tissue  Goal of Therapy:  Heparin level 0.3-0.7 units/ml Monitor platelets by anticoagulation protocol: Yes   Plan:  - Heparin infusion 1150 units/hr to begin at 2200 - Daily heparin level and CBC - D/C sq heparin  Levester Fresh, PharmD, BCPS, [facility] Clinical Pharmacist Pager [phone] 06/07/2016 12:25 PM

## 2016-06-07 NOTE — Progress Notes (Addendum)
PULMONARY / CRITICAL CARE MEDICINE   Name: Juan Wheeler MRN: [id number] DOB: May 20, 1961    ADMISSION DATE:  06/05/2016 CONSULTATION DATE:  06/05/16  REFERRING MD:  Dr. Arnoldo Morale / [facility]  CHIEF COMPLAINT:  Buttock abscess  HISTORY OF PRESENT ILLNESS:   55 y/o M with a past medical history of chronic low back pain, CAD (05/2015 EF 54% by stress test), HTN, LV dysfunction, PAD, DVT (2013, no longer on any coagulation), known pulmonary nodules colon cancer & rectal adenocarcinoma treated with oral chemotherapy, LAR with diverting loop ileostomy, subsequent ileostomy closure (2012 at [facility]) who presented to the [facility] ER with complaints of left buttock pain.  The patient had been seen by his primary care physician and referred to the ER for further evaluation. Per chart review he denied fevers, chills, nausea, vomiting, diarrhea, bloody stools/melena. He was seen at [facility] on 9/5 by oncology and started on Bactrim for possible prostatitis.  Symptoms progressed and he sought care 9/8 in the ER.  ER exam reflects entire left buttock erythematous, indurated, warm and tender to palpation. He was found to be hypotensive with systolic BP into the 76E and febrile with a tmax of 103.6.  Initial labs- NA 131, K4.3, CL 100, CO2 22, serum creatinine 2.08, glucose 136, alkaline phosphatase 73, albumin 2.8, total bilirubin 1.4, PCT 1.06, lactic acid 2.54, WBC 20.7, hemoglobin 15.7 and platelets 183. Gen. surgery (Dr. Arnoldo Morale) was consulted. Clindamycin, vancomycin and Zosyn administered in the ER. Aggressive volume resuscitation with 4 L normal saline. CT pelvis obtained which demonstrated changes consistent with significant cellulitis in the left buttock with diffuse irregular air collection medially within the buttock extending into the gluteal muscles and posterior upper left thigh/pelvic cavity. No definitive fluid component identified at that time. Decision was made for the patient to be taken to the OR  for drainage of left buttock abscess given concerns of gangrene.  Post OR, the patient was transferred to [facility] for further evaluation.  SUBJECTIVE:  Had elevated BP last night   VITAL SIGNS: BP 114/72   Pulse 79   Temp 97.6 F (36.4 C) (Oral)   Resp 18   Ht '5\' 9"'$  (1.753 m)   Wt 90.3 kg (199 lb 1.2 oz)   SpO2 98%   BMI 29.40 kg/m   HEMODYNAMICS: CVP:  [3 mmHg-7 mmHg] 3 mmHg  VENTILATOR SETTINGS:    INTAKE / OUTPUT: I/O last 3 completed shifts: In: 6412.3 [I.V.:4142.3; Other:990; NG/GT:30; IV Piggyback:1250] Out: 68 [Urine:2965; Emesis/NG output:675]  PHYSICAL EXAMINATION: General:  Well-developed adult male in no acute distress on VM/[place] [facility]:  Awake, follws commands HEENT:  MM pink/moist, (-) NVD, (-) oral thrush Cardiovascular:  S1s2 rrr, no m/r/g  Lungs:  Good ae, some crackles at bases Abdomen:  Soft, non-tender, bsx4 active Musculoskeletal:  No acute deformities  Skin:  Warm/dry, no edema   LABS:  BMET  Recent Labs Lab 06/06/16 0014 06/06/16 0443 06/06/16 2309  NA 135 136 133*  K 4.9 4.8 4.1  CL 106 107 104  CO2 '23 23 22  '$ BUN '11 10 9  '$ CREATININE 1.36* 1.33* 1.20  GLUCOSE 142* 133* 93    Electrolytes  Recent Labs Lab 06/06/16 0014 06/06/16 0443 06/06/16 2309  CALCIUM 7.5* 7.5* 7.6*  MG 1.9  --  1.9    CBC  Recent Labs Lab 06/02/16 0958 06/05/16 0636 06/05/16 0649 06/06/16 0443  WBC 21.0* 20.7*  --  18.0*  HGB 17.7* 15.7 17.0 12.5*  HCT 50.2 44.1 50.0 37.3*  PLT 206 183  --  177    Coag's No results for input(s): APTT, INR in the last 168 hours.  Sepsis Markers  Recent Labs Lab 06/05/16 0649 06/05/16 2230 06/05/16 2305 06/06/16 0443 06/07/16 0442  LATICACIDVEN 2.57*  --  1.2  --   --   PROCALCITON  --  1.70  --  1.80 1.22    ABG  Recent Labs Lab 06/05/16 1535 06/05/16 1706  PHART 7.200* 7.285*  PCO2ART 50.7* 47.8  PO2ART 69.0* 161.0*    Liver Enzymes  Recent Labs Lab 06/02/16 0958  06/05/16 0636 06/06/16 0443  AST 16 40 35  ALT 13* 26 25  ALKPHOS 70 73 60  BILITOT 1.3* 1.4* 0.8  ALBUMIN 3.5 2.8* 1.8*    Cardiac Enzymes  Recent Labs Lab 06/06/16 0014 06/06/16 0930  TROPONINI 0.58* 0.04*    Glucose  Recent Labs Lab 06/06/16 1622 06/06/16 1952 06/07/16 0006 06/07/16 0403 06/07/16 0825 06/07/16 1249  GLUCAP 79 95 92 83 91 100*    Imaging No results found.   STUDIES:  9/8 CT Pelvis >> CT pelvis obtained which demonstrated changes consistent with significant cellulitis in the left buttock with diffuse irregular air collection medially within the buttock extending into the gluteal muscles and posterior upper left thigh/pelvic cavity. No definitive fluid component identified at that time.  CULTURES: Sputum 9/8 >>  Buttock Abscess 9/8 >>  BCx2 9/8 >> UC 9/8 >>   ANTIBIOTICS: Zosyn 9/8 >>  Vanco 9/8 >>  Clinda 9/8 >>   SIGNIFICANT EVENTS: 9/08  Admit with left buttock abscess, concern for necrotizing fasciitis   LINES/TUBES: ETT 9/8 >>  L IJ CVC 9/8 >>   DISCUSSION: 55 year old male admitted 9/8 with a left buttock abscess concerning for necrotizing fasciitis status post surgical I&D. Transferred to [facility] 9/8 for further evaluation.  ASSESSMENT / PLAN:  PULMONARY A: RLL asp pna.  Denies h/o copd or osa.  P:   Keep o2 sats > 88% BiPaP prn  CARDIOVASCULAR A:  HTN afib/aflutter .  P:  Restart Norvasc at 10 mg and Imdur at 15 mg. Holding off on ACEinh 2/2 recent AKI Start heparin drip  RENAL A:   AKI - in setting of sepsis Mild elevation of Lactic Acid. Improved.  P:   Trend BMP / UOP  Replace electrolytes as indicated   GASTROINTESTINAL A:   Left Buttock Abscess - s/p I&D of 9/8 Hx of Adenocarcinoma of Rectum - 2013 P:   Start diet   HEMATOLOGIC A:   Leukocytosis - in setting of left buttock abscess  P:  See ID  Trend CBC  SCD's for DVT prophylaxis   INFECTIOUS A:   Left Buttock Abscess s/p  I&D 9/8 and 9/10 Severe Sepsis - secondary to above  P:   ABX as above  Monitor cultures  Appreciate CCS input  ENDOCRINE A:   At Risk Hyper / Hypoglycemia   P:   Monitor glucose on BMP   NEUROLOGIC A:   pain  P:   RASS goal: 0 - -1 Takes opiates at home for pain. Prn fentanyl. WOF hypercapnea.   FAMILY  - Updates: No family at bedside.   - Inter-disciplinary family meet or Palliative Care meeting due by: 9/15  Critical care time with this patient today : 30 minutes.   Monica Becton, MD 06/07/2016, 1:13 PM [facility] Pager [phone] After 3 pm  or if no answer, call [phone]

## 2016-06-07 NOTE — Anesthesia Preprocedure Evaluation (Addendum)
Anesthesia Evaluation  Patient identified by MRN, date of birth, ID band Patient awake    Reviewed: Allergy & Precautions, NPO status , Patient's Chart, lab work & pertinent test results  Airway Mallampati: I  TM Distance: >3 FB Neck ROM: Full    Dental  (+) Partial Lower, Edentulous Upper, Dental Advisory Given   Pulmonary Current Smoker,    breath sounds clear to auscultation       Cardiovascular hypertension, Pt. on medications + angina + CAD, + Cardiac Stents and + Peripheral Vascular Disease  + dysrhythmias Atrial Fibrillation  Rhythm:Irregular Rate:Normal     Neuro/Psych PSYCHIATRIC DISORDERS Depression negative neurological ROS     GI/Hepatic Neg liver ROS, GERD  Medicated,  Endo/Other  negative endocrine ROS  Renal/GU negative Renal ROS  negative genitourinary   Musculoskeletal negative musculoskeletal ROS (+)   Abdominal Normal abdominal exam  (+)   Peds negative pediatric ROS (+)  Hematology negative hematology ROS (+)   Anesthesia Other Findings   Reproductive/Obstetrics negative OB ROS                            Lab Results  Component Value Date   WBC 18.0 (H) 06/06/2016   HGB 12.5 (L) 06/06/2016   HCT 37.3 (L) 06/06/2016   MCV 95.2 06/06/2016   PLT 177 06/06/2016   Lab Results  Component Value Date   CREATININE 1.20 06/06/2016   BUN 9 06/06/2016   NA 133 (L) 06/06/2016   K 4.1 06/06/2016   CL 104 06/06/2016   CO2 22 06/06/2016   Lab Results  Component Value Date   INR 1.00 06/24/2015   INR 0.90 04/14/2013   05/2016 EKG: atrial fibrillation.  Anesthesia Physical Anesthesia Plan  ASA: III and emergent  Anesthesia Plan: General   Post-op Pain Management:    Induction: Intravenous, Rapid sequence and Cricoid pressure planned  Airway Management Planned: Oral ETT  Additional Equipment:   Intra-op Plan:   Post-operative Plan: Extubation in  OR  Informed Consent: I have reviewed the patients History and Physical, chart, labs and discussed the procedure including the risks, benefits and alternatives for the proposed anesthesia with the patient or authorized representative who has indicated his/her understanding and acceptance.   Dental advisory given  Plan Discussed with: CRNA  Anesthesia Plan Comments:         Anesthesia Quick Evaluation

## 2016-06-07 NOTE — Progress Notes (Signed)
Patient ID: Juan Wheeler, male   DOB: [birth year], 55 y.o.   MRN: [id number]  Dr. Redmond Pulling is going to take the patient back to the OR today for further debridement of the left buttock wound.  I explained the risks to the patient including but not limited to bleeding, on going infection, need for further surgery, etc. He agrees to proceed.

## 2016-06-07 NOTE — Progress Notes (Signed)
[place] Progress Note Patient Name: Juan Wheeler DOB: [birth year] MRN: [id number]   Date of Service  06/07/2016  HPI/Events of Note  Reviewed EKG showing atrial flutter with variable 2-1 block. Serum magnesium 1.9. Serum potassium 4.1. Heart rate currently 99-102 on telemetry.   eICU Interventions  1. Continuing telemetry monitoring 2. Magnesium sulfate 1 g IV      Intervention Category Major Interventions: Arrhythmia - evaluation and management  Tera Partridge 06/07/2016, 12:02 AM

## 2016-06-07 NOTE — Progress Notes (Signed)
2 Days Post-Op  Subjective: Patient reports less pain Still in A-fib  Objective: Vital signs in last 24 hours: Temp:  [99.2 F (37.3 C)-100.2 F (37.9 C)] 99.2 F (37.3 C) (09/10 0404) Pulse Rate:  [32-155] 32 (09/10 0600) Resp:  [11-33] 25 (09/10 0600) BP: (103-169)/(58-150) 147/80 (09/10 0600) SpO2:  [86 %-100 %] 99 % (09/10 0600) Arterial Line BP: (99-250)/(55-113) 158/68 (09/10 0600) Weight:  [90.3 kg (199 lb 1.2 oz)] 90.3 kg (199 lb 1.2 oz) (09/10 0434) Last BM Date:  (PTA)  Intake/Output from previous day: 09/09 0701 - 09/10 0700 In: 2883 [I.V.:1933; NG/GT:30; IV Piggyback:800] Out: 2675 [Urine:2000; Emesis/NG output:675] Intake/Output this shift: No intake/output data recorded.  Buttock wound with further necrosis in skin and fat  Lab Results:   Recent Labs  06/05/16 0636 06/05/16 0649 06/06/16 0443  WBC 20.7*  --  18.0*  HGB 15.7 17.0 12.5*  HCT 44.1 50.0 37.3*  PLT 183  --  177   BMET  Recent Labs  06/06/16 0443 06/06/16 2309  NA 136 133*  K 4.8 4.1  CL 107 104  CO2 23 22  GLUCOSE 133* 93  BUN 10 9  CREATININE 1.33* 1.20  CALCIUM 7.5* 7.6*   PT/INR No results for input(s): LABPROT, INR in the last 72 hours. ABG  Recent Labs  06/05/16 1535 06/05/16 1706  PHART 7.200* 7.285*  HCO3 19.8* 22.7    Studies/Results: Portable Chest Xray  Result Date: 06/06/2016 CLINICAL DATA:  Acute respiratory failure. EXAM: PORTABLE CHEST 1 VIEW COMPARISON:  Radiograph of June 05, 2016. FINDINGS: Endotracheal and nasogastric tubes are unchanged in position. Right internal jugular catheter is unchanged. No pneumothorax is noted. Mild left basilar subsegmental atelectasis is noted. Stable right basilar opacity is noted concerning for atelectasis or pneumonia with associated pleural effusion. Bony thorax is unremarkable. IMPRESSION: Stable support apparatus. Mild left basilar subsegmental atelectasis. Stable right basilar atelectasis or pneumonia with  associated pleural effusion. Electronically Signed   By: Marijo Conception, M.D.   On: 06/06/2016 09:18   Dg Chest Port 1 View  Result Date: 06/05/2016 CLINICAL DATA:  Acute respiratory failure. EXAM: PORTABLE CHEST 1 VIEW COMPARISON:  Chest radiograph June 05, 2016 at 1123 hours FINDINGS: Patient rotated to the RIGHT. Lung bases incompletely imaged. Endotracheal tube tip projects 4.3 cm above the carina. RIGHT internal jugular central venous catheter projecting cavoatrial junction. Nasogastric tube past distal esophagus, distal tip not imaged. Worsening RIGHT lung volume loss with consolidation and at least small pleural effusion. LEFT lung base strandy densities consistent with atelectasis. Diffuse mild interstitial prominence. Cardiac silhouette is normal in size. No pneumothorax. Soft tissue planes and included osseous structures are unchanged. IMPRESSION: Worsening RIGHT lung consolidation and atelectasis and small pleural effusion concerning for underlying pneumonia. Increasing interstitial prominence, possibly infectious. Stable appearance of life-support lines. Electronically Signed   By: Elon Alas M.D.   On: 06/05/2016 15:25   Dg Chest Port 1 View  Result Date: 06/05/2016 CLINICAL DATA:  Central line placement. EXAM: PORTABLE CHEST 1 VIEW COMPARISON:  Radiograph of same day. FINDINGS: Stable cardiomediastinal silhouette. Endotracheal tube is seen over tracheal air shadow with distal tip 5 cm above the carina. Nasogastric tube is noted, although distal tip is not well visualized. Interval placement of right internal jugular catheter with distal tip in expected position of cavoatrial junction. Mild central pulmonary vascular congestion is noted with probable left perihilar edema. Significantly increased opacities are noted a right midlung area concerning for atelectasis or possibly  inflammation. No pneumothorax is noted. IMPRESSION: Endotracheal tube in grossly good position. Interval  placement of right internal jugular catheter with distal tip in grossly good position. No pneumothorax is noted. Nasogastric tube is noted as well, but distal tip is not well visualized on this study. Probable mild left perihilar edema is noted. Interval development of significant right midlung opacities are noted concerning for atelectasis or possibly pneumonia. Electronically Signed   By: Marijo Conception, M.D.   On: 06/05/2016 11:39    Anti-infectives: Anti-infectives    Start     Dose/Rate Route Frequency Ordered Stop   06/05/16 2200  piperacillin-tazobactam (ZOSYN) IVPB 3.375 g     3.375 g 12.5 mL/hr over 240 Minutes Intravenous Every 8 hours 06/05/16 1602     06/05/16 1800  clindamycin (CLEOCIN) IVPB 600 mg     600 mg 100 mL/hr over 30 Minutes Intravenous Every 6 hours 06/05/16 1453     06/05/16 1630  vancomycin (VANCOCIN) IVPB 750 mg/150 ml premix     750 mg 150 mL/hr over 60 Minutes Intravenous Every 12 hours 06/05/16 1605     06/05/16 1615  piperacillin-tazobactam (ZOSYN) IVPB 3.375 g     3.375 g 100 mL/hr over 30 Minutes Intravenous  Once 06/05/16 1601 06/05/16 1744   06/05/16 1400  piperacillin-tazobactam (ZOSYN) IVPB 3.375 g  Status:  Discontinued     3.375 g 12.5 mL/hr over 240 Minutes Intravenous Every 8 hours 06/05/16 0937 06/05/16 1553   06/05/16 0800  clindamycin (CLEOCIN) IVPB 600 mg     600 mg 100 mL/hr over 30 Minutes Intravenous  Once 06/05/16 0759 06/05/16 0846   06/05/16 0645  vancomycin (VANCOCIN) IVPB 1000 mg/200 mL premix     1,000 mg 200 mL/hr over 60 Minutes Intravenous  Once 06/05/16 0637 06/05/16 0846   06/05/16 0645  piperacillin-tazobactam (ZOSYN) IVPB 3.375 g     3.375 g 12.5 mL/hr over 240 Minutes Intravenous  Once 06/05/16 8786 06/05/16 0825      Assessment/Plan: s/p Procedure(s): INCISION AND DRAINAGE ABSCESS (Left)  I think the wound needs further debridement in the OR. Will make NPO in case Dr. Redmond Pulling can do it today.  If not, will resume  clears and plan on potential surgery tomorrow.   OK to start Heparin today if surgery does not happen today.  LOS: 2 days    Gresia Isidoro A 06/07/2016

## 2016-06-07 NOTE — Anesthesia Postprocedure Evaluation (Signed)
Anesthesia Post Note  Patient: Juan Wheeler  Procedure(s) Performed: Procedure(s) (LRB): IRRIGATION AND DEBRIDEMENT LEFT BUTTOCK ABSCESS (Left)  Patient location during evaluation: PACU Anesthesia Type: General Level of consciousness: awake and alert Pain management: pain level controlled Vital Signs Assessment: post-procedure vital signs reviewed and stable Respiratory status: spontaneous breathing, nonlabored ventilation, respiratory function stable and patient connected to nasal cannula oxygen Cardiovascular status: blood pressure returned to baseline and stable Postop Assessment: no signs of nausea or vomiting Anesthetic complications: no    Last Vitals:  Vitals:   06/07/16 2000 06/07/16 2004  BP: 138/70   Pulse: 88   Resp: (!) 21   Temp:  36.9 C    Last Pain:  Vitals:   06/07/16 2004  TempSrc: Oral  PainSc:                  [name]

## 2016-06-07 NOTE — Anesthesia Procedure Notes (Signed)
Procedure Name: Intubation Date/Time: 06/07/2016 9:55 AM Performed by: Neldon Newport Pre-anesthesia Checklist: Timeout performed, Patient being monitored, Suction available, Emergency Drugs available and Patient identified Patient Re-evaluated:Patient Re-evaluated prior to inductionOxygen Delivery Method: Circle system utilized Preoxygenation: Pre-oxygenation with 100% oxygen Intubation Type: Rapid sequence and IV induction Ventilation: Mask ventilation without difficulty Laryngoscope Size: Mac and 3 Grade View: Grade I Tube type: Oral Tube size: 7.5 mm Number of attempts: 1 Placement Confirmation: breath sounds checked- equal and bilateral,  positive ETCO2 and ETT inserted through vocal cords under direct vision Secured at: 23 cm Tube secured with: Tape Dental Injury: Teeth and Oropharynx as per pre-operative assessment

## 2016-06-07 NOTE — H&P (View-Only) (Signed)
Patient ID: Juan Wheeler, male   DOB: 05/06/1961, 55 y.o.   MRN: [id number]  Dr. Redmond Pulling is going to take the patient back to the OR today for further debridement of the left buttock wound.  I explained the risks to the patient including but not limited to bleeding, on going infection, need for further surgery, etc. He agrees to proceed.

## 2016-06-07 NOTE — Progress Notes (Signed)
Called Elink regarding patient's BP in 250s and O2 sat in low 80s after coughing up mucus this morning. Patient was not compensating and was placed on a non rebreather from [place]. Patient's vitals are now trending back in the right direction and will continue to monitor closely. Joaquin Bend E, [place] 06/07/2016  [phone]

## 2016-06-07 NOTE — Interval H&P Note (Signed)
History and Physical Interval Note:  06/07/2016 9:43 AM  Juan Wheeler  has presented today for surgery, with the diagnosis of Left Buttock Abscess  The various methods of treatment have been discussed with the patient and family. After consideration of risks, benefits and other options for treatment, the patient has consented to  Procedure(s): IRRIGATION AND DEBRIDEMENT PERIRECTAL ABSCESS (N/A) as a surgical intervention .  The patient's history has been reviewed, patient examined, no change in status, stable for surgery.  I have reviewed the patient's chart and labs.  Questions were answered to the patient's satisfaction.    [name]. Redmond Pulling, MD, [place], Bariatric, & Minimally Invasive Surgery [facility], [place]   [facility]

## 2016-06-07 NOTE — Progress Notes (Signed)
ANTICOAGULATION CONSULT NOTE - Initial Consult  Pharmacy Consult for Heparin Indication: atrial fibrillation  Allergies  Allergen Reactions  . Darvocet [Propoxyphene N-Acetaminophen] Palpitations  . Propoxyphene Palpitations    Patient Measurements: Height: '5\' 9"'$  (175.3 cm) Weight: 199 lb 1.2 oz (90.3 kg) IBW/kg (Calculated) : 70.7 Heparin Dosing Weight: 81 kg  Vital Signs: Temp: 99.2 F (37.3 C) (09/10 0404) Temp Source: Oral (09/10 0404) BP: 147/80 (09/10 0600) Pulse Rate: 32 (09/10 0600)  Labs:  Recent Labs  06/05/16 0636 06/05/16 0649 06/06/16 0014 06/06/16 0443 06/06/16 0930 06/06/16 2309  HGB 15.7 17.0  --  12.5*  --   --   HCT 44.1 50.0  --  37.3*  --   --   PLT 183  --   --  177  --   --   CREATININE 2.08* 2.00* 1.36* 1.33*  --  1.20  TROPONINI  --   --  0.58*  --  0.04*  --     Estimated Creatinine Clearance: 76.3 mL/min (by C-G formula based on SCr of 1.2 mg/dL).   Medical History: Past Medical History:  Diagnosis Date  . Chronic lower back pain    a. Followed by [facility] management at [facility].  . Colon cancer [facility])    rectal cancer  . Coronary artery disease    a. 03/2013: abnl nuc -> LHC s/p DES to LCx, residual moderate disease in LAD (med rx unless refractory angina). b. Not on BB due to bradycardia.  Marland Kitchen DVT (deep venous thrombosis) ([place]) ~ 2013  . GERD (gastroesophageal reflux disease)   . History of blood transfusion    "once; after throwing up alot of blood" (04/17/2013)  . Hypertension   . LV dysfunction    a. EF 45% in 03/2013.  Marland Kitchen PAD (peripheral artery disease) ([place])    a. Occlusion of the right internal iliac artery, with significant atherosclerosis in the left internal iliac which was not amenable to reconstruction per notes from [facility]  . Pulmonary nodules 09/30/2014  . Rectal cancer ([place])   . Tobacco abuse     Assessment: 55 year old male admitted 9/8 with a left buttock abscess concerning for necrotizing fasciitis status  post surgical I&D. Now with afib, pharmacy is consulted to start IV heparin. Hgb 12.5, pltc 177K   Goal of Therapy:  Heparin level 0.3-0.7 units/ml Monitor platelets by anticoagulation protocol: Yes   Plan:  - Heparin bolus 4000 units - Heparin infusion 1150 units/hr - f/u 6 hr heparin level at 1400 - Daily heparin level and CBC - D/C sq heparin  Maryanna Shape, PharmD, BCPS  Clinical Pharmacist  Pager: [phone]   06/07/2016,7:29 AM

## 2016-06-07 NOTE — Progress Notes (Signed)
[place] Progress Note Patient Name: Juan Wheeler DOB: 07-07-1961 MRN: [id number]   Date of Service  06/07/2016  HPI/Events of Note  Patient somewhat restless. [place] check showed  patient with moderate respiratory distress and accessory muscle use during mouth breathing. Awake and alert otherwise. Not tachycardic. Hypertensive by arterial line. Patient complaining of buttock pain.   eICU Interventions  1. Temporarily switched to nonrebreather mask to improve oxygenation with mouth breathing 2. Plan to wean FiO2 for saturation greater than 92% 3. Hydralazine IV when necessary systolic blood pressure greater than 175      Intervention Category Intermediate Interventions: Hypertension - evaluation and management  Tera Partridge 06/07/2016, 5:51 AM

## 2016-06-07 NOTE — Op Note (Signed)
06/05/2016 - 06/07/2016  11:14 AM  PATIENT:  Juan Wheeler  56 y.o. male  PRE-OPERATIVE DIAGNOSIS:  Left Buttock Abscess/necrotizing soft tissue infection  POST-OPERATIVE DIAGNOSIS: Left buttock necrotizing soft tissue infection  PROCEDURE:  Procedure(s): DEBRIDEMENT of skin, subcutaneous tissue, and muscle of left buttock, pulsatile lavage  SURGEON:  Surgeon(s): Greer Pickerel, MD  ASSISTANTS: none   ANESTHESIA:   general  FINDINGS:   Tool used for debridement (curette, scapel, etc.)  scalpel, scissors  Frequency of surgical debridement.   Second debridement   Measurement of total devitalized tissue (wound surface) before and after surgical debridement.   Wound measured 9 and half centimeters vertical by 1/2 cm wide by 7 cm deep. It did undermined toward the left of approximately 3 cm. After debridement wound measured 9 and has to meters vertical by 5 cm wide by about 7-1/2 cm deep. Undermining was 4 cm    Blood loss and description of tissue removed.  Minimal blood loss, necrotic skin, subcutaneous fat and superficial gluteus muscle was excised    Was there any viable tissue removed (measurements): Less than 1 cm   DRAINS: none   LOCAL MEDICATIONS USED:  NONE  SPECIMEN:  No Specimen  DISPOSITION OF SPECIMEN:  N/A  COUNTS:  YES  INDICATION FOR PROCEDURE: 55 year old male presented to outside hospital with signs of necrotizing soft tissue infection. He underwent incision, drainage and debridement and then was transferred to our hospital for critical care management. On rounds today he was found to have ongoing evidence of necrotic soft tissue infection. We recommended operative debridement. Please see notes regarding discussion regarding surgery  PROCEDURE: After containing informed consent he was taken to operating room 8. General endotracheal anesthesia was established. He was then placed in the prone position with the appropriate padding. Sequential compression devices had  been placed. He was on therapeutic antibiotics. A surgical timeout was performed. The area was prepped with Betadine. The left without wound measured 9-1/2 cm vertical by 1/2 cm wide by 7 cm deep. It did track laterally and undermined the left skin for about 3 cm. There was dead necrotic grayish fat along with necrotic superficial gluteus muscle. There is also some necrotic skin. The dead skin was excised sharply with scalpel. The necrotic subcutaneous tissue including fat and muscle was excised with scalpel, scissors and electrocautery. The wound was pulsatile lavaged with 3 L of saline. IMA stasis was achieved. The wound was packed with a moist Kerlix and covered with dry gauze. The patient was placed in supine position and extubated and taken to the recovery room in stable condition. All needle, instrument and sponge counts were correct 2   PLAN OF CARE: pacu then icu  PATIENT DISPOSITION:  PACU - hemodynamically stable.   Delay start of Pharmacological VTE agent (>24hrs) due to surgical blood loss or risk of bleeding:  No (can start heparin tonight at 10 pm)  [name]. Redmond Pulling, MD, FACS General, Bariatric, & Minimally Invasive Surgery [facility], [place]

## 2016-06-08 ENCOUNTER — Encounter (HOSPITAL_COMMUNITY): Payer: Self-pay | Admitting: General Surgery

## 2016-06-08 LAB — HEPARIN LEVEL (UNFRACTIONATED)
Heparin Unfractionated: <0.1 IU/mL — ABNORMAL LOW (ref 0.30–0.70)
Heparin Unfractionated: 0.12 IU/mL — ABNORMAL LOW (ref 0.30–0.70)
Heparin Unfractionated: 0.12 IU/mL — ABNORMAL LOW (ref 0.30–0.70)

## 2016-06-08 LAB — GLUCOSE, CAPILLARY
Glucose-Capillary: 125 mg/dL — ABNORMAL HIGH (ref 65–99)
Glucose-Capillary: 78 mg/dL (ref 65–99)
Glucose-Capillary: 90 mg/dL (ref 65–99)
Glucose-Capillary: 105 mg/dL — ABNORMAL HIGH (ref 65–99)

## 2016-06-08 LAB — CULTURE, RESPIRATORY W GRAM STAIN: Culture: NORMAL

## 2016-06-08 LAB — CBC
HCT: 35.7 % — ABNORMAL LOW (ref 39.0–52.0)
Hemoglobin: 12.3 g/dL — ABNORMAL LOW (ref 13.0–17.0)
MCH: 32.3 pg (ref 26.0–34.0)
MCHC: 34.5 g/dL (ref 30.0–36.0)
MCV: 93.7 fL (ref 78.0–100.0)
Platelets: 240 10*3/uL (ref 150–400)
RBC: 3.81 MIL/uL — ABNORMAL LOW (ref 4.22–5.81)
RDW: 15.1 % (ref 11.5–15.5)
WBC: 14.9 10*3/uL — ABNORMAL HIGH (ref 4.0–10.5)

## 2016-06-08 MED ORDER — HEPARIN (PORCINE) IN NACL 100-0.45 UNIT/ML-% IJ SOLN
1850.0000 [IU]/h | INTRAMUSCULAR | Status: AC
  Administered 2016-06-08: 1600 [IU]/h via INTRAVENOUS
  Administered 2016-06-09 – 2016-06-10 (×3): 1850 [IU]/h via INTRAVENOUS
  Filled 2016-06-08 (×4): qty 250

## 2016-06-08 NOTE — Progress Notes (Signed)
Transferred patient to 5W with all patient belongings including clothing, cell phone, cell phone charger and dentures.

## 2016-06-08 NOTE — Progress Notes (Addendum)
Pharmacy Antibiotic Note Davaughn Hillyard is a 55 y.o. male admitted on 06/05/2016 with left buttocks abscess s/p drainage in OR.  Antibiotics to narrow to Zosyn monotherapy.  Patient's renal function is stable.   Plan: - Continue zosyn 3.375gm IV Q8H, 4 hr infusion - Monitor renal fxn, clinical progress, further de-escalation  Height: '5\' 9"'$  (175.3 cm) Weight: 195 lb 5.2 oz (88.6 kg) IBW/kg (Calculated) : 70.7  Temp (24hrs), Avg:98.1 F (36.7 C), Min:97.6 F (36.4 C), Max:98.8 F (37.1 C)   Recent Labs Lab 06/02/16 0958 06/05/16 0636 06/05/16 0649 06/05/16 2305 06/06/16 0014 06/06/16 0443 06/06/16 2309 06/08/16 0448  WBC 21.0* 20.7*  --   --   --  18.0*  --  14.9*  CREATININE 1.48* 2.08* 2.00*  --  1.36* 1.33* 1.20  --   LATICACIDVEN  --   --  2.57* 1.2  --   --   --   --     Estimated Creatinine Clearance: 75.7 mL/min (by C-G formula based on SCr of 1.2 mg/dL).    Allergies  Allergen Reactions  . Darvocet [Propoxyphene N-Acetaminophen] Palpitations  . Propoxyphene Palpitations    Antimicrobials this admission: 9/8 Zosyn >>  9/8 Vancomycin  >> 9/11 9/8 Clindamycin >> 9/11  Dose adjustments this admission: 9/8 TA - negative 9/8 Blood x 2: NGTD x 2 days 9/8 left buttocks - holding for possible anaerobes   Jaxn Chiquito D. Mina Marble, PharmD, BCPS Pager:  [phone] 06/08/2016, 12:01 PM    ============================   HL = 0.12 (goal 0.3 - 0.7 units/mL) Heparin dosing weight = 81 kg   Assessment: 59 YOM continues on IV heparin for new-onset Afib.  Heparin level sub-therapeutic but trending up.  No bleeding reported.   Plan: - Increase heparin gtt to 1600 units/hr - Check 6 hr heparin level    Mekisha Bittel D. Mina Marble, PharmD, BCPS 06/08/2016, 1:03 PM

## 2016-06-08 NOTE — Progress Notes (Signed)
Patient ID: Juan Wheeler, male   DOB: 12/27/1960, 55 y.o.   MRN: [id number] 1 Day Post-Op  Subjective: Feels better, sore but no severe pain  Objective: Vital signs in last 24 hours: Temp:  [97.5 F (36.4 C)-98.8 F (37.1 C)] 97.8 F (36.6 C) (09/11 0744) Pulse Rate:  [74-161] 104 (09/11 0600) Resp:  [16-29] 20 (09/11 0600) BP: (98-140)/(69-114) 136/82 (09/11 0600) SpO2:  [92 %-100 %] 100 % (09/11 0600) Weight:  [88.6 kg (195 lb 5.2 oz)] 88.6 kg (195 lb 5.2 oz) (09/11 0400) Last BM Date:  (PTA)  Intake/Output from previous day: 09/10 0701 - 09/11 0700 In: 1910 [P.O.:180; I.V.:1680; IV Piggyback:50] Out: 3195 [Urine:3175; Blood:20] Intake/Output this shift: Total I/O In: 1088 [I.V.:1088] Out: -   General appearance: alert, cooperative and no distress Incision/Wound: Packing removed and wound carefully examined.  Some superficial necrotic tissue, no sinificant induration, erythema or drainage  Lab Results:   Recent Labs  06/06/16 0443 06/08/16 0448  WBC 18.0* 14.9*  HGB 12.5* 12.3*  HCT 37.3* 35.7*  PLT 177 240   BMET  Recent Labs  06/06/16 0443 06/06/16 2309  NA 136 133*  K 4.8 4.1  CL 107 104  CO2 23 22  GLUCOSE 133* 93  BUN 10 9  CREATININE 1.33* 1.20  CALCIUM 7.5* 7.6*     Studies/Results: No results found.  Anti-infectives: Anti-infectives    Start     Dose/Rate Route Frequency Ordered Stop   06/05/16 2200  piperacillin-tazobactam (ZOSYN) IVPB 3.375 g     3.375 g 12.5 mL/hr over 240 Minutes Intravenous Every 8 hours 06/05/16 1602     06/05/16 1800  clindamycin (CLEOCIN) IVPB 600 mg     600 mg 100 mL/hr over 30 Minutes Intravenous Every 6 hours 06/05/16 1453     06/05/16 1630  vancomycin (VANCOCIN) IVPB 750 mg/150 ml premix     750 mg 150 mL/hr over 60 Minutes Intravenous Every 12 hours 06/05/16 1605     06/05/16 1615  piperacillin-tazobactam (ZOSYN) IVPB 3.375 g     3.375 g 100 mL/hr over 30 Minutes Intravenous  Once 06/05/16 1601 06/05/16  1744   06/05/16 1400  piperacillin-tazobactam (ZOSYN) IVPB 3.375 g  Status:  Discontinued     3.375 g 12.5 mL/hr over 240 Minutes Intravenous Every 8 hours 06/05/16 0937 06/05/16 1553   06/05/16 0800  clindamycin (CLEOCIN) IVPB 600 mg     600 mg 100 mL/hr over 30 Minutes Intravenous  Once 06/05/16 0759 06/05/16 0846   06/05/16 0645  vancomycin (VANCOCIN) IVPB 1000 mg/200 mL premix     1,000 mg 200 mL/hr over 60 Minutes Intravenous  Once 06/05/16 0637 06/05/16 0846   06/05/16 0645  piperacillin-tazobactam (ZOSYN) IVPB 3.375 g     3.375 g 12.5 mL/hr over 240 Minutes Intravenous  Once 06/05/16 6269 06/05/16 0825      Assessment/Plan: s/p Procedure(s): IRRIGATION AND DEBRIDEMENT LEFT BUTTOCK ABSCESS Improved.  Wound looks OK and should continue to improve with dressing changes, do not see need for further debridement at this time.   LOS: 3 days    Karston Hyland T 06/08/2016

## 2016-06-08 NOTE — Progress Notes (Signed)
[place] for Heparin Indication: atrial fibrillation  Allergies  Allergen Reactions  . Darvocet [Propoxyphene N-Acetaminophen] Palpitations  . Propoxyphene Palpitations    Patient Measurements: Height: '5\' 9"'$  (175.3 cm) Weight: 195 lb 5.2 oz (88.6 kg) IBW/kg (Calculated) : 70.7 Heparin Dosing Weight: 81 kg  Vital Signs: Temp: 98.4 F (36.9 C) (09/11 1537) Temp Source: Oral (09/11 1537) BP: 128/81 (09/11 2000) Pulse Rate: 103 (09/11 2000)  Labs:  Recent Labs  06/06/16 0014 06/06/16 0443 06/06/16 0930 06/06/16 2309 06/08/16 0447 06/08/16 0448 06/08/16 1223 06/08/16 1955  HGB  --  12.5*  --   --   --  12.3*  --   --   HCT  --  37.3*  --   --   --  35.7*  --   --   PLT  --  177  --   --   --  240  --   --   HEPARINUNFRC  --   --   --   --  <0.10*  --  0.12* 0.12*  CREATININE 1.36* 1.33*  --  1.20  --   --   --   --   TROPONINI 0.58*  --  0.04*  --   --   --   --   --     Estimated Creatinine Clearance: 75.7 mL/min (by C-G formula based on SCr of 1.2 mg/dL).  Assessment: 55 year old male admitted 9/8 with a left buttock abscess concerning for necrotizing fasciitis status post surgical I&D. Now with afib, pharmacy is consulted to start IV heparin. Hgb 12.5, pltc 177K  Heparin level remains subtherapeutic at 0.12, verified rate with RN, heparin has been running appropriately without problems.   Goal of Therapy:  Heparin level 0.3-0.7 units/ml Monitor platelets by anticoagulation protocol: Yes   Plan:  Increase heparin to 1850 units/hr; will hold additional bolus given recent I&D, back and forth to OR Check anti-Xa level in 6 hours and daily while on heparin Continue to monitor H&H and platelets   Thank you for allowing [place] to participate in this patients care. Jens Som, PharmD Pager: [phone] 06/08/2016 8:41 PM

## 2016-06-08 NOTE — Progress Notes (Signed)
ANTICOAGULATION CONSULT NOTE - Initial Consult  Pharmacy Consult for Heparin Indication: atrial fibrillation  Allergies  Allergen Reactions  . Darvocet [Propoxyphene N-Acetaminophen] Palpitations  . Propoxyphene Palpitations    Patient Measurements: Height: '5\' 9"'$  (175.3 cm) Weight: 195 lb 5.2 oz (88.6 kg) IBW/kg (Calculated) : 70.7 Heparin Dosing Weight: 81 kg  Vital Signs: Temp: 97.8 F (36.6 C) (09/11 0418) Temp Source: Oral (09/11 0418) BP: 122/86 (09/11 0500) Pulse Rate: 86 (09/11 0500)  Labs:  Recent Labs  06/05/16 0636 06/05/16 0649 06/06/16 0014 06/06/16 0443 06/06/16 0930 06/06/16 2309 06/08/16 0447 06/08/16 0448  HGB 15.7 17.0  --  12.5*  --   --   --  12.3*  HCT 44.1 50.0  --  37.3*  --   --   --  35.7*  PLT 183  --   --  177  --   --   --  240  HEPARINUNFRC  --   --   --   --   --   --  <0.10*  --   CREATININE 2.08* 2.00* 1.36* 1.33*  --  1.20  --   --   TROPONINI  --   --  0.58*  --  0.04*  --   --   --     Estimated Creatinine Clearance: 75.7 mL/min (by C-G formula based on SCr of 1.2 mg/dL).  Assessment: 55 year old male admitted 9/8 with a left buttock abscess concerning for necrotizing fasciitis status post surgical I&D. Now with afib, pharmacy is consulted to start IV heparin. Hgb 12.5, pltc 177K  HL undetectable, verified with RN, heparin has been running appropriately.   Goal of Therapy:  Heparin level 0.3-0.7 units/ml Monitor platelets by anticoagulation protocol: Yes   Plan:  - Increase heparin to 1300 units/hr; will hold additional bolus given recent I&D, bath and forth to OR - Check level this afternoon  - Daily HL, CBC    Hughes Better, PharmD, BCPS Clinical Pharmacist 06/08/2016 5:59 AM

## 2016-06-08 NOTE — Transfer of Care (Signed)
Immediate Anesthesia Transfer of Care Note  Patient: Juan Wheeler  Procedure(s) Performed: Procedure(s): IRRIGATION AND DEBRIDEMENT LEFT BUTTOCK ABSCESS (Left)  Patient Location: PACU  Anesthesia Type:General  Level of Consciousness: awake, alert  and oriented  Airway & Oxygen Therapy: Patient Spontanous Breathing and Patient connected to nasal cannula oxygen  Post-op Assessment: Report given to RN, Post -op Vital signs reviewed and stable and Patient moving all extremities X 4  Post vital signs: Reviewed and stable  Last Vitals:  Vitals:   06/08/16 0600 06/08/16 0744  BP: 136/82   Pulse: (!) 104   Resp: 20   Temp:  36.6 C    Last Pain:  Vitals:   06/08/16 0744  TempSrc: Oral  PainSc:       Patients Stated Pain Goal: 2 (02/63/78 5885)  Complications: No apparent anesthesia complications

## 2016-06-08 NOTE — Care Management Important Message (Signed)
Important Message  Patient Details  Name: Lindberg Zenon MRN: [id number] Date of Birth: Jan 27, 1961   Medicare Important Message Given:  Yes    Reiley Bertagnolli 06/08/2016, 1:23 PM

## 2016-06-08 NOTE — Progress Notes (Signed)
PULMONARY / CRITICAL CARE MEDICINE   Name: Juan Wheeler MRN: [id number] DOB: [birth year]    ADMISSION DATE:  06/05/2016 CONSULTATION DATE:  06/05/16  REFERRING MD:  Dr. Arnoldo Morale / [facility]  CHIEF COMPLAINT:  Buttock abscess  HISTORY OF PRESENT ILLNESS:   55 y/o M with a past medical history of chronic low back pain, CAD (05/2015 EF 54% by stress test), HTN, LV dysfunction, PAD, DVT (2013, no longer on any coagulation), known pulmonary nodules colon cancer & rectal adenocarcinoma treated with oral chemotherapy, LAR with diverting loop ileostomy, subsequent ileostomy closure (2012 at [facility]) who presented to the [facility] ER with complaints of left buttock pain.  The patient had been seen by his primary care physician and referred to the ER for further evaluation. Per chart review he denied fevers, chills, nausea, vomiting, diarrhea, bloody stools/melena. He was seen at [facility] on 9/5 by oncology and started on Bactrim for possible prostatitis.  Symptoms progressed and he sought care 9/8 in the ER.  ER exam reflects entire left buttock erythematous, indurated, warm and tender to palpation. He was found to be hypotensive with systolic BP into the 87O and febrile with a tmax of 103.6.  Initial labs- NA 131, K4.3, CL 100, CO2 22, serum creatinine 2.08, glucose 136, alkaline phosphatase 73, albumin 2.8, total bilirubin 1.4, PCT 1.06, lactic acid 2.54, WBC 20.7, hemoglobin 15.7 and platelets 183. Gen. surgery (Dr. Arnoldo Morale) was consulted. Clindamycin, vancomycin and Zosyn administered in the ER. Aggressive volume resuscitation with 4 L normal saline. CT pelvis obtained which demonstrated changes consistent with significant cellulitis in the left buttock with diffuse irregular air collection medially within the buttock extending into the gluteal muscles and posterior upper left thigh/pelvic cavity. No definitive fluid component identified at that time. Decision was made for the patient to be taken to the OR  for drainage of left buttock abscess given concerns of gangrene.  Post OR, the patient was transferred to [facility] for further evaluation.  SUBJECTIVE:  Had I and D on 9/10. Tolerated well. Off pressors. Eating.    VITAL SIGNS: BP 136/82 (BP Location: Right Arm)   Pulse (!) 104   Temp 97.8 F (36.6 C) (Oral)   Resp 20   Ht '5\' 9"'$  (1.753 m)   Wt 88.6 kg (195 lb 5.2 oz)   SpO2 100%   BMI 28.84 kg/m   HEMODYNAMICS:    VENTILATOR SETTINGS:    INTAKE / OUTPUT: I/O last 3 completed shifts: In: 6767 [P.O.:180; I.V.:2580; Other:110; IV Piggyback:500] Out: 2094 [BSJGG:8366; Blood:20]  PHYSICAL EXAMINATION: General:  Well-developed adult male in no acute distress on [place] Neuro:  Awake, follws commands HEENT:  MM pink/moist, (-) NVD, (-) oral thrush Cardiovascular:  S1s2 rrr, no m/r/g  Lungs:  Good ae, CTA Abdomen:  Soft, non-tender, bsx4 active Musculoskeletal:  No acute deformities  Skin:  Warm/dry, no edema   LABS:  BMET  Recent Labs Lab 06/06/16 0014 06/06/16 0443 06/06/16 2309  NA 135 136 133*  K 4.9 4.8 4.1  CL 106 107 104  CO2 '23 23 22  '$ BUN '11 10 9  '$ CREATININE 1.36* 1.33* 1.20  GLUCOSE 142* 133* 93    Electrolytes  Recent Labs Lab 06/06/16 0014 06/06/16 0443 06/06/16 2309  CALCIUM 7.5* 7.5* 7.6*  MG 1.9  --  1.9    CBC  Recent Labs Lab 06/05/16 0636 06/05/16 0649 06/06/16 0443 06/08/16 0448  WBC 20.7*  --  18.0* 14.9*  HGB 15.7 17.0  12.5* 12.3*  HCT 44.1 50.0 37.3* 35.7*  PLT 183  --  177 240    Coag's No results for input(s): APTT, INR in the last 168 hours.  Sepsis Markers  Recent Labs Lab 06/05/16 0649 06/05/16 2230 06/05/16 2305 06/06/16 0443 06/07/16 0442  LATICACIDVEN 2.57*  --  1.2  --   --   PROCALCITON  --  1.70  --  1.80 1.22    ABG  Recent Labs Lab 06/05/16 1535 06/05/16 1706  PHART 7.200* 7.285*  PCO2ART 50.7* 47.8  PO2ART 69.0* 161.0*    Liver Enzymes  Recent Labs Lab 06/02/16 0958  06/05/16 0636 06/06/16 0443  AST 16 40 35  ALT 13* 26 25  ALKPHOS 70 73 60  BILITOT 1.3* 1.4* 0.8  ALBUMIN 3.5 2.8* 1.8*    Cardiac Enzymes  Recent Labs Lab 06/06/16 0014 06/06/16 0930  TROPONINI 0.58* 0.04*    Glucose  Recent Labs Lab 06/07/16 1249 06/07/16 1657 06/07/16 2002 06/07/16 2346 06/08/16 0417 06/08/16 0742  GLUCAP 100* 98 125* 105* 125* 78    Imaging No results found.   STUDIES:  9/8 CT Pelvis >> CT pelvis obtained which demonstrated changes consistent with significant cellulitis in the left buttock with diffuse irregular air collection medially within the buttock extending into the gluteal muscles and posterior upper left thigh/pelvic cavity. No definitive fluid component identified at that time.  CULTURES: Sputum 9/8 >> (-) Buttock Abscess 9/8 >> (-) BCx2 9/8 >> (-) UC 9/8 >> (-)  ANTIBIOTICS: Zosyn 9/8 >>  Vanco 9/8 >> 9/11 Clinda 9/8 >> 9/11  SIGNIFICANT EVENTS: 9/08  Admit with left buttock abscess, concern for necrotizing fasciitis  9/`0  Had I and D of abscess.   LINES/TUBES: ETT 9/8 >>  L IJ CVC 9/8 >>   DISCUSSION: 55 year old male admitted 9/8 with a left buttock abscess concerning for necrotizing fasciitis status post surgical I&D. Transferred to [facility] 9/8 for further evaluation. He clinically improved with Abx and IVF. Levophed was weaned off. I and D done on 9/10. Had intermittent afib and aflutter while in ICU.   ASSESSMENT / PLAN:  PULMONARY A: RLL atelectasis Likely with OSA. No COPD  P:   Keep o2 sats > 88%  CARDIOVASCULAR A:  HTN afib/aflutter .  P:  Cont Norvasc at 10 mg and Imdur at 15 mg. Holding off on ACEinh 2/2 recent AKI Cont heparin drip > new this admit for afib/flutter > may switch to PO OAC once no surgery is necessary. May need Cards evaln inpt vs outpt  RENAL A:   AKI - in setting of sepsis Mild elevation of Lactic Acid. Improved.  P:   Check BMP in am  GASTROINTESTINAL A:    Left Buttock Abscess - s/p I&D of 9/8 and 9/10 Hx of Adenocarcinoma of Rectum - 2013 P:   Cont diet  HEMATOLOGIC A:   Leukocytosis - in setting of left buttock abscess  P:  See ID  Trend CBC  On heparin drip for afib/flutter.    INFECTIOUS A:   Left Buttock Abscess s/p I&D 9/8 and 9/10 Severe Sepsis - secondary to above  P:   ABX as above  Will d/c vanc and clinda today. Cont zosyn > switch to PO soon.    ENDOCRINE A:   At Risk Hyper / Hypoglycemia   P:   Monitor glucose on BMP   NEUROLOGIC A:   pain  P:   RASS goal: 0 - -1 Takes opiates  at home for pain.   FAMILY  - Updates: Discussed case with pt and he is in agreement.   - Inter-disciplinary family meet or Palliative Care meeting due by: 9/15  Pt will be transferred to med Surg. TH Dr. Thereasa Solo is aware. TH to be primary on 9/12 and PCCM off on 9/12.   Monica Becton, MD 06/08/2016, 11:26 AM [facility] Pager [phone] After 3 pm or if no answer, call [phone]

## 2016-06-08 NOTE — Progress Notes (Signed)
Attempted report to 5W Nurse Verdene Lennert.  Will call back.

## 2016-06-09 DIAGNOSIS — L0390 Cellulitis, unspecified: Secondary | ICD-10-CM

## 2016-06-09 DIAGNOSIS — I480 Paroxysmal atrial fibrillation: Secondary | ICD-10-CM

## 2016-06-09 DIAGNOSIS — N179 Acute kidney failure, unspecified: Secondary | ICD-10-CM

## 2016-06-09 DIAGNOSIS — K219 Gastro-esophageal reflux disease without esophagitis: Secondary | ICD-10-CM

## 2016-06-09 DIAGNOSIS — I4891 Unspecified atrial fibrillation: Secondary | ICD-10-CM

## 2016-06-09 LAB — BASIC METABOLIC PANEL
Anion gap: 8 (ref 5–15)
BUN: 7 mg/dL (ref 6–20)
CO2: 25 mmol/L (ref 22–32)
Calcium: 8.6 mg/dL — ABNORMAL LOW (ref 8.9–10.3)
Chloride: 106 mmol/L (ref 101–111)
Creatinine, Ser: 1.02 mg/dL (ref 0.61–1.24)
GFR calc Af Amer: >60 mL/min (ref >60)
GFR calc non Af Amer: >60 mL/min (ref >60)
Glucose, Bld: 120 mg/dL — ABNORMAL HIGH (ref 65–99)
Potassium: 4.2 mmol/L (ref 3.5–5.1)
Sodium: 139 mmol/L (ref 135–145)

## 2016-06-09 LAB — CBC
HCT: 41 % (ref 39.0–52.0)
Hemoglobin: 14 g/dL (ref 13.0–17.0)
MCH: 32 pg (ref 26.0–34.0)
MCHC: 34.1 g/dL (ref 30.0–36.0)
MCV: 93.6 fL (ref 78.0–100.0)
Platelets: 332 10*3/uL (ref 150–400)
RBC: 4.38 MIL/uL (ref 4.22–5.81)
RDW: 15 % (ref 11.5–15.5)
WBC: 15.1 10*3/uL — ABNORMAL HIGH (ref 4.0–10.5)

## 2016-06-09 LAB — MRSA PCR SCREENING: MRSA by PCR: NEGATIVE

## 2016-06-09 LAB — AEROBIC/ANAEROBIC CULTURE W GRAM STAIN (SURGICAL/DEEP WOUND)

## 2016-06-09 LAB — PHOSPHORUS: Phosphorus: 2.9 mg/dL (ref 2.5–4.6)

## 2016-06-09 LAB — GLUCOSE, CAPILLARY: Glucose-Capillary: 109 mg/dL — ABNORMAL HIGH (ref 65–99)

## 2016-06-09 LAB — MAGNESIUM: Magnesium: 1.9 mg/dL (ref 1.7–2.4)

## 2016-06-09 LAB — HEPARIN LEVEL (UNFRACTIONATED): Heparin Unfractionated: 0.38 IU/mL (ref 0.30–0.70)

## 2016-06-09 LAB — TSH: TSH: 2.467 u[IU]/mL (ref 0.350–4.500)

## 2016-06-09 MED ORDER — ENSURE ENLIVE PO LIQD: 237.0000 mL | Freq: Three times a day (TID) | ORAL | Status: DC

## 2016-06-09 MED ORDER — METOPROLOL TARTRATE 25 MG PO TABS
25.0000 mg | ORAL_TABLET | Freq: Two times a day (BID) | ORAL | Status: DC
  Administered 2016-06-09 – 2016-06-16 (×14): 25 mg via ORAL
  Filled 2016-06-09 (×14): qty 1

## 2016-06-09 NOTE — Progress Notes (Addendum)
Patient ID: Juan Wheeler, male   DOB: October 14, 1960, 55 y.o.   MRN: [id number]  [facility] Surgery Progress Note  2 Days Post-Op  Subjective: Had large BM this morning and unfortunately it got into the dressing/wound.  Still very sore. No acute events over night.  Objective: Vital signs in last 24 hours: Temp:  [98 F (36.7 C)-98.5 F (36.9 C)] 98.3 F (36.8 C) (09/12 0546) Pulse Rate:  [95-118] 110 (09/12 0546) Resp:  [15-24] 18 (09/12 0546) BP: (106-133)/(61-99) 133/83 (09/12 0546) SpO2:  [93 %-97 %] 95 % (09/12 0546) Last BM Date: 06/05/16  Intake/Output from previous day: 09/11 0701 - 09/12 0700 In: 1555.2 [P.O.:120; I.V.:1435.2] Out: 1448 [Urine:4125] Intake/Output this shift: Total I/O In: -  Out: 850 [Urine:850]  PE: General appearance: alert, NAD, pleasant Cardio: RRR Pulmonary: CTAB Incision/Wound: Packing removed and thoroughly washed with saline but appears that some fecal matter likely remains, no erythema or drainage.   Lab Results:   Recent Labs  06/08/16 0448 06/09/16 0928  WBC 14.9* 15.1*  HGB 12.3* 14.0  HCT 35.7* 41.0  PLT 240 332   BMET  Recent Labs  06/06/16 2309 06/09/16 0928  NA 133* 139  K 4.1 4.2  CL 104 106  CO2 22 25  GLUCOSE 93 120*  BUN 9 7  CREATININE 1.20 1.02  CALCIUM 7.6* 8.6*   PT/INR No results for input(s): LABPROT, INR in the last 72 hours. CMP     Component Value Date/Time   NA 139 06/09/2016 0928   K 4.2 06/09/2016 0928   CL 106 06/09/2016 0928   CO2 25 06/09/2016 0928   GLUCOSE 120 (H) 06/09/2016 0928   BUN 7 06/09/2016 0928   CREATININE 1.02 06/09/2016 0928   CREATININE 1.10 04/14/2013 1617   CALCIUM 8.6 (L) 06/09/2016 0928   PROT 4.9 (L) 06/06/2016 0443   ALBUMIN 1.8 (L) 06/06/2016 0443   AST 35 06/06/2016 0443   ALT 25 06/06/2016 0443   ALKPHOS 60 06/06/2016 0443   BILITOT 0.8 06/06/2016 0443   GFRNONAA >60 06/09/2016 0928   GFRAA >60 06/09/2016 0928   Lipase  No results found for:  LIPASE     Studies/Results: No results found.  Anti-infectives: Anti-infectives    Start     Dose/Rate Route Frequency Ordered Stop   06/05/16 2200  piperacillin-tazobactam (ZOSYN) IVPB 3.375 g     3.375 g 12.5 mL/hr over 240 Minutes Intravenous Every 8 hours 06/05/16 1602     06/05/16 1800  clindamycin (CLEOCIN) IVPB 600 mg  Status:  Discontinued     600 mg 100 mL/hr over 30 Minutes Intravenous Every 6 hours 06/05/16 1453 06/08/16 1155   06/05/16 1630  vancomycin (VANCOCIN) IVPB 750 mg/150 ml premix  Status:  Discontinued     750 mg 150 mL/hr over 60 Minutes Intravenous Every 12 hours 06/05/16 1605 06/08/16 1155   06/05/16 1615  piperacillin-tazobactam (ZOSYN) IVPB 3.375 g     3.375 g 100 mL/hr over 30 Minutes Intravenous  Once 06/05/16 1601 06/05/16 1744   06/05/16 1400  piperacillin-tazobactam (ZOSYN) IVPB 3.375 g  Status:  Discontinued     3.375 g 12.5 mL/hr over 240 Minutes Intravenous Every 8 hours 06/05/16 0937 06/05/16 1553   06/05/16 0800  clindamycin (CLEOCIN) IVPB 600 mg     600 mg 100 mL/hr over 30 Minutes Intravenous  Once 06/05/16 0759 06/05/16 0846   06/05/16 0645  vancomycin (VANCOCIN) IVPB 1000 mg/200 mL premix     1,000 mg 200  mL/hr over 60 Minutes Intravenous  Once 06/05/16 0637 06/05/16 0846   06/05/16 0645  piperacillin-tazobactam (ZOSYN) IVPB 3.375 g     3.375 g 12.5 mL/hr over 240 Minutes Intravenous  Once 06/05/16 1610 06/05/16 0825       Assessment/Plan S/p Procedure(s): IRRIGATION AND DEBRIDEMENT LEFT BUTTOCK ABSCESS 06/07/16 and 06/05/16 - wound thoroughly washed at bedside but concerned that fecal matter remains - wound repacked - will consult PT for hydrotherapy - continue BID wet to dry dressing changes for now  ID - Zosyn 9/8 >>  Vanco 9/8 >> 9/11 Clinda 9/8 >> 9/11 FEN - regular diet VTE - heparin  Plan - hydrotherapy  Hydrotherapy called and came to see patient.  When they took dressing out of the buttocks abscess dressing stool is  coming out.  It appears he has a fistula to the buttocks.  Will make NPO, Get a PICC line and set up for TNA.  I will talk with Dr. [name] about next steps.     LOS: 4 days    Jerrye Beavers , [facility] 06/09/2016, 11:21 AM Pager: [phone] Consults: [phone] Mon-Fri 7:00 am-4:30 pm Sat-Sun 7:00 am-11:30 am

## 2016-06-09 NOTE — Progress Notes (Signed)
Nutrition Follow-up  DOCUMENTATION CODES:   Not applicable  INTERVENTION:   Will order strawberry Ensure Enlive TID. Each oral nutrition supplement provides 350 kcal and 20 grams protein.  NUTRITION DIAGNOSIS:   Increased nutrient needs related to wound healing as evidenced by estimated needs.  Ongoing  GOAL:   Patient will meet greater than or equal to 90% of their needs  Progressing  MONITOR:   PO intake, Supplement acceptance, Weight trends   ASSESSMENT:   55 year old male with a history of rectal cancer managed at [facility] by low anterior resection with diverting loop ileostomy/ s/p ileostomy reversal in 2014, who presented to [facility] ED this morning with left buttock pain. S/P I&D of left buttock for Fournier's gangrene.   Spoke with pt and son at bedside. Pt extubated 9/9. Pt reports having a good appetite PTA with no recent weight loss. Pt reports usual body weight as 179-181 #. Pt consuming approximately 25% of meals during hospitalization. Pt reveals that he has purposefully decreased his PO intake to avoid having diarrhea.  Spoke with pt about consuming an oral nutrition supplement while in the hospital. Pt was amenable. RD and Intern will order Strawberry Ensure Enlive TID.  Procedures: I&D on 9/8 and 9/10. Hydrotherapy planned. Meds: heparin, Norvasc Labs reviewed. CBGs: 78-125 NFPE: Exam performed. Findings are mild muscle depletion, no fat depletion, and no edema.  Diet Order:  Diet regular Room service appropriate? Yes; Fluid consistency: Thin  Skin:  Wound (see comment) (Left buttock Fournier's gangrene S/P I&D)  Last BM:  06/05/16  Height:   Ht Readings from Last 1 Encounters:  06/08/16 '5\' 9"'$  (1.753 m)   Weight:   Wt Readings from Last 1 Encounters:  06/08/16 195 lb 5.2 oz (88.6 kg)   Ideal Body Weight:  72.7 kg  BMI:  Body mass index is 28.84 kg/m.  Estimated Nutritional Needs:   Kcal:  2100-2300  Protein:  110-125  grams  Fluid:  2.1-2.3 L  EDUCATION NEEDS:   No education needs identified at this time  Jeb Levering Dietetic Intern Pager Number: [phone]

## 2016-06-09 NOTE — Progress Notes (Signed)
Hydrotherapy Cancellation/Discharge Note  Patient Details Name: Geoge Lawrance MRN: [id number] DOB: 07-24-1961   Cancelled Treatment:    Reason Eval/Treat Not Completed: Medical issues which prohibited therapy. Removed packing from wound. Packing with significant stool but surrounding skin free from stool. After packing removed found stool flowing from base of wound into wound. Appears to have a fistula. Will Jennings surgery PA in to see wound. Placed moist kerlix in wound to contain stool until surgery can further assess. Hydrotherapy not appropriate at this time. Will sign off.   Larry Knipp 06/09/2016, 3:22 PM [employer] PT [phone]

## 2016-06-09 NOTE — Progress Notes (Signed)
[facility] PROGRESS NOTE  Juan Wheeler ZJQ:[id number] DOB: 1961-02-01 DOA: 06/05/2016 PCP: [name], MD  Interim summary and HPI 55 year old male admitted 9/8 with a left buttock abscess concerning for necrotizing fasciitis status post surgical I&D. Transferred to [facility] 9/8 for further evaluation. He clinically improved with Abx and IVF. Levophed was weaned off. I and D done on 9/10. Had intermittent afib and aflutter while in ICU. Now stable and extubated. Transfer to telemetry bed and to [facility] hospital on 9/12  Assessment/Plan: 1-Sepsis due to left buttock abscess/cellulitis: and with concerns for necrotizing fasciitis  -s/p I&D X 2 -general surgery on board and dictating wound care -continue IV zosyn  -patient afebrile and with WBC's trending down appropriately -will follow rec's and continue supportive care -sepsis features resolving/resolved  2-new diagnosis of PAF: patient A. Fib/A. Flutter on telemetry -rate controlled currently -continue metoprolol and heparin for now -patient with normal TSH and normal 2-D echo -CHADsVASC score 3 -will need outpatient follow up with cardiology service; conversion to oral anticoagulation once clear by surgery  3-essential HTN -continue imdur and metoprolol  4-AKI: in setting of sepsis  -Cr back to normal range now -maintain adequate hydration and perfusion -follow BMET intermittently  5-hx of adenocarcinoma of the rectum: -s/p resection and currently in remission -will continue outpatient follow up with Hem/Onc   6-chronic pain syndrome -continue outpatient pain medication regimen   7-GERD -will continue famotidine   Code Status: Full Family Communication: no family at bedside  Disposition Plan: to be determine; continue current antibiotics and follow general surgery rec's for further wound care.   Consultants:  PCCM  General surgery   Procedures and significant events : 9/8 CT Pelvis >> CT pelvis  obtained which demonstrated changes consistent with significant cellulitis in the left buttock with diffuse irregular air collection medially within the buttock extending into the gluteal muscles and posterior upper left thigh/pelvic cavity. No definitive fluid component identified at that time. 9/08  Admit with left buttock abscess, concern for necrotizing fasciitis. Had I&D at [facility] by Dr. Arnoldo Morale 9/10  Had further debridement and cleaning by [facility] at [facility] 9/8>>9/10: intubated  Antibiotics: Zosyn 9/8 >>  Vanco 9/8 >> 9/11 Clinda 9/8 >> 9/11  HPI/Subjective: Afebrile, overall feeling better. denies overnight complaints. No CP, no SOB, no orthopnea. Patient reports pain in his left buttock area.  Objective: Vitals:   06/09/16 0546 06/09/16 1407  BP: 133/83 121/73  Pulse: (!) 110 100  Resp: 18 20  Temp: 98.3 F (36.8 C) 98.1 F (36.7 C)    Intake/Output Summary (Last 24 hours) at 06/09/16 1735 Last data filed at 06/09/16 1005  Gross per 24 hour  Intake              152 ml  Output             2725 ml  Net            -2573 ml   Filed Weights   06/06/16 0134 06/07/16 0434 06/08/16 0400  Weight: 90 kg (198 lb 6.6 oz) 90.3 kg (199 lb 1.2 oz) 88.6 kg (195 lb 5.2 oz)    Exam:   General:  Afebrile, denies CP, SOB and palpitations. Patient is complaining of pain in left buttocks.  Cardiovascular: no rubs, no gallops, irregular, no murmurs appreciated  Respiratory: CTA bilaterally  Abdomen: soft, NT, positive BS  Musculoskeletal: no Le edema, no cyanosis   Skin: left buttock area with large open wound, no erythema or  drainage appreciated. This morning had BM and wound/packing got all dirty. Surgery is coming to assist nurse with wound cleaning/care.  Data Reviewed: Basic Metabolic Panel:  Recent Labs Lab 06/05/16 0636 06/05/16 6226 06/06/16 0014 06/06/16 0443 06/06/16 2309 06/09/16 0928  NA 131* 133* 135 136 133* 139  K 4.3 4.3 4.9 4.8 4.1 4.2  CL 100* 98* 106 107 104  106  CO2 22  --  '23 23 22 25  '$ GLUCOSE 136* 136* 142* 133* 93 120*  BUN 22* 21* '11 10 9 7  '$ CREATININE 2.08* 2.00* 1.36* 1.33* 1.20 1.02  CALCIUM 8.4*  --  7.5* 7.5* 7.6* 8.6*  MG  --   --  1.9  --  1.9 1.9  PHOS  --   --   --   --   --  2.9   Liver Function Tests:  Recent Labs Lab 06/05/16 0636 06/06/16 0443  AST 40 35  ALT 26 25  ALKPHOS 73 60  BILITOT 1.4* 0.8  PROT 7.0 4.9*  ALBUMIN 2.8* 1.8*   CBC:  Recent Labs Lab 06/05/16 0636 06/05/16 0649 06/06/16 0443 06/08/16 0448 06/09/16 0928  WBC 20.7*  --  18.0* 14.9* 15.1*  NEUTROABS 16.6*  --  15.0*  --   --   HGB 15.7 17.0 12.5* 12.3* 14.0  HCT 44.1 50.0 37.3* 35.7* 41.0  MCV 93.4  --  95.2 93.7 93.6  PLT 183  --  177 240 332   Cardiac Enzymes:  Recent Labs Lab 06/06/16 0014 06/06/16 0930  TROPONINI 0.58* 0.04*   CBG:  Recent Labs Lab 06/08/16 0417 06/08/16 0742 06/08/16 1136 06/08/16 1536 06/08/16 2052  GLUCAP 125* 78 90 105* 109*    Recent Results (from the past 240 hour(s))  Blood culture (routine x 2)     Status: None (Preliminary result)   Collection Time: 06/05/16  6:45 AM  Result Value Ref Range Status   Specimen Description BLOOD RIGHT FOREARM DRAWN BY RN YW  Final   Special Requests BOTTLES DRAWN AEROBIC ONLY 5CC  Final   Culture NO GROWTH 4 DAYS  Final   Report Status PENDING  Incomplete  Blood culture (routine x 2)     Status: None (Preliminary result)   Collection Time: 06/05/16  7:40 AM  Result Value Ref Range Status   Specimen Description BLOOD LEFT HAND DRAWN BY RN RH  Final   Special Requests BOTTLES DRAWN AEROBIC AND ANAEROBIC 5CC EACH  Final   Culture NO GROWTH 4 DAYS  Final   Report Status PENDING  Incomplete  Aerobic/Anaerobic Culture (surgical/deep wound)     Status: None   Collection Time: 06/05/16 10:36 AM  Result Value Ref Range Status   Specimen Description BUTTOCKS LEFT  Final   Special Requests NONE  Final   Gram Stain   Final    RARE WBC PRESENT,BOTH PMN AND  MONONUCLEAR NO ORGANISMS SEEN    Culture   Final    RARE PEPTOSTREPTOCOCCUS SPECIES FEW BACTEROIDES VULGATUS BETA LACTAMASE NEGATIVE Performed at [facility]    Report Status 06/09/2016 FINAL  Final  Culture, respiratory (NON-Expectorated)     Status: None   Collection Time: 06/05/16  5:08 PM  Result Value Ref Range Status   Specimen Description TRACHEAL ASPIRATE  Final   Special Requests NONE  Final   Gram Stain   Final    MODERATE WBC PRESENT,BOTH PMN AND MONONUCLEAR NO ORGANISMS SEEN    Culture Consistent with normal respiratory flora.  Final  Report Status 06/08/2016 FINAL  Final  MRSA PCR Screening     Status: None   Collection Time: 06/09/16 11:31 AM  Result Value Ref Range Status   MRSA by PCR NEGATIVE NEGATIVE Final    Comment:        The GeneXpert MRSA Assay (FDA approved for NASAL specimens only), is one component of a comprehensive MRSA colonization surveillance program. It is not intended to diagnose MRSA infection nor to guide or monitor treatment for MRSA infections.      Studies: No results found.  Scheduled Meds: . famotidine  20 mg Per Tube BID  . isosorbide mononitrate  15 mg Oral Daily  . metoprolol tartrate  25 mg Oral BID  . piperacillin-tazobactam (ZOSYN)  IV  3.375 g Intravenous Q8H   Continuous Infusions: . heparin 1,850 Units/hr (06/09/16 1021)    Active Problems:   Fournier's gangrene in male   Abscess   Sepsis ([place])   Necrotizing fasciitis ([place])   Central venous catheter in place   Acute respiratory failure ([place])   Time spent: 30 minutes    Barton Dubois  [facility] Pager [phone].  If 7PM-7AM, please contact night-coverage at [email or web address], password [facility] 06/09/2016, 5:35 PM  LOS: 4 days

## 2016-06-09 NOTE — Progress Notes (Signed)
[place] for Heparin Indication: atrial fibrillation  Allergies  Allergen Reactions  . Darvocet [Propoxyphene N-Acetaminophen] Palpitations  . Propoxyphene Palpitations    Patient Measurements: Height: '5\' 9"'$  (175.3 cm) Weight: 195 lb 5.2 oz (88.6 kg) IBW/kg (Calculated) : 70.7 Heparin Dosing Weight: 81 kg  Vital Signs: Temp: 98.3 F (36.8 C) (09/12 0546) Temp Source: Oral (09/12 0546) BP: 133/83 (09/12 0546) Pulse Rate: 110 (09/12 0546)  Labs:  Recent Labs  06/06/16 2309  06/08/16 0448 06/08/16 1223 06/08/16 1955 06/09/16 0928  HGB  --   --  12.3*  --   --  14.0  HCT  --   --  35.7*  --   --  41.0  PLT  --   --  240  --   --  332  HEPARINUNFRC  --   < >  --  0.12* 0.12* 0.38  CREATININE 1.20  --   --   --   --  1.02  < > = values in this interval not displayed.  Estimated Creatinine Clearance: 89.1 mL/min (by C-G formula based on SCr of 1.02 mg/dL).  Assessment: 55 year old male admitted 9/8 with a left buttock abscess concerning for necrotizing fasciitis status post surgical I&D. Continues on heparin for new onset Afib - will likely require long-term AC.    Heparin level therapeutic at 0.38. CBC WNL and stable. No overt bleeding reported.   Goal of Therapy:  Heparin level 0.3-0.7 units/ml Monitor platelets by anticoagulation protocol: Yes   Plan:  Continue heparin at 1850 units/hr Monitor HL, CBC, s/sx bleeding F/U long-term [facility] plans   [place], PharmD Clinical Pharmacist 11:56 AM, 06/09/2016

## 2016-06-09 NOTE — Care Management Important Message (Signed)
Important Message  Patient Details  Name: Juan Wheeler MRN: [id number] Date of Birth: 1961-03-15   Medicare Important Message Given:  Yes    [name] [name] 06/09/2016, 12:05 PM

## 2016-06-09 NOTE — Progress Notes (Addendum)
Pharmacy consulted to dose TPN for nutrition support for new fistula to the buttocks.  PICC line has been ordered but not yet placed.  Plan: TPN labs have been ordered for 9/13 am Pharmacy will order TPN to start 9/13 at 1800 Thanks Eudelia Bunch, Pharm.D. [phone] 06/09/2016 5:04 PM

## 2016-06-10 ENCOUNTER — Inpatient Hospital Stay (HOSPITAL_COMMUNITY): Payer: Commercial Managed Care - HMO

## 2016-06-10 LAB — DIFFERENTIAL
Basophils Absolute: 0.1 10*3/uL (ref 0.0–0.1)
Basophils Relative: 1 %
Eosinophils Absolute: 0.4 10*3/uL (ref 0.0–0.7)
Eosinophils Relative: 3 %
Lymphocytes Relative: 15 %
Lymphs Abs: 1.9 10*3/uL (ref 0.7–4.0)
Monocytes Absolute: 0.7 10*3/uL (ref 0.1–1.0)
Monocytes Relative: 6 %
Neutro Abs: 9.3 10*3/uL — ABNORMAL HIGH (ref 1.7–7.7)
Neutrophils Relative %: 75 %

## 2016-06-10 LAB — PHOSPHORUS: Phosphorus: 3.3 mg/dL (ref 2.5–4.6)

## 2016-06-10 LAB — CULTURE, BLOOD (ROUTINE X 2)
Culture: NO GROWTH
Culture: NO GROWTH

## 2016-06-10 LAB — COMPREHENSIVE METABOLIC PANEL
ALT: 39 U/L (ref 17–63)
AST: 39 U/L (ref 15–41)
Albumin: 2.3 g/dL — ABNORMAL LOW (ref 3.5–5.0)
Alkaline Phosphatase: 55 U/L (ref 38–126)
Anion gap: 9 (ref 5–15)
BUN: 7 mg/dL (ref 6–20)
CO2: 23 mmol/L (ref 22–32)
Calcium: 8.7 mg/dL — ABNORMAL LOW (ref 8.9–10.3)
Chloride: 105 mmol/L (ref 101–111)
Creatinine, Ser: 1.11 mg/dL (ref 0.61–1.24)
GFR calc Af Amer: >60 mL/min (ref >60)
GFR calc non Af Amer: >60 mL/min (ref >60)
Glucose, Bld: 107 mg/dL — ABNORMAL HIGH (ref 65–99)
Potassium: 4.1 mmol/L (ref 3.5–5.1)
Sodium: 137 mmol/L (ref 135–145)
Total Bilirubin: 1.2 mg/dL (ref 0.3–1.2)
Total Protein: 6.3 g/dL — ABNORMAL LOW (ref 6.5–8.1)

## 2016-06-10 LAB — CBC
HCT: 42.4 % (ref 39.0–52.0)
Hemoglobin: 14.5 g/dL (ref 13.0–17.0)
MCH: 32.2 pg (ref 26.0–34.0)
MCHC: 34.2 g/dL (ref 30.0–36.0)
MCV: 94.2 fL (ref 78.0–100.0)
Platelets: 356 10*3/uL (ref 150–400)
RBC: 4.5 MIL/uL (ref 4.22–5.81)
RDW: 15 % (ref 11.5–15.5)
WBC: 12.4 10*3/uL — ABNORMAL HIGH (ref 4.0–10.5)

## 2016-06-10 LAB — GLUCOSE, CAPILLARY: Glucose-Capillary: 114 mg/dL — ABNORMAL HIGH (ref 65–99)

## 2016-06-10 LAB — MAGNESIUM: Magnesium: 2 mg/dL (ref 1.7–2.4)

## 2016-06-10 LAB — HEPARIN LEVEL (UNFRACTIONATED): Heparin Unfractionated: 0.47 IU/mL (ref 0.30–0.70)

## 2016-06-10 LAB — PREALBUMIN: Prealbumin: 8.8 mg/dL — ABNORMAL LOW (ref 18–38)

## 2016-06-10 LAB — TRIGLYCERIDES: Triglycerides: 144 mg/dL (ref <150)

## 2016-06-10 MED ORDER — ZINC TRACE METAL 1 MG/ML IV SOLN
INTRAVENOUS | Status: AC
  Administered 2016-06-10: 17:00:00 via INTRAVENOUS
  Filled 2016-06-10: qty 960

## 2016-06-10 MED ORDER — SODIUM CHLORIDE 0.9% FLUSH
10.0000 mL | INTRAVENOUS | Status: DC | PRN
  Administered 2016-06-10 – 2016-06-15 (×6): 10 mL
  Administered 2016-06-15 – 2016-06-16 (×2): 20 mL
  Filled 2016-06-10 (×8): qty 40

## 2016-06-10 MED ORDER — MORPHINE SULFATE (PF) 2 MG/ML IV SOLN
2.0000 mg | INTRAVENOUS | Status: DC | PRN
  Administered 2016-06-10 – 2016-06-11 (×4): 2 mg via INTRAVENOUS
  Administered 2016-06-11 (×2): 4 mg via INTRAVENOUS
  Administered 2016-06-11: 2 mg via INTRAVENOUS
  Administered 2016-06-11 – 2016-06-12 (×6): 4 mg via INTRAVENOUS
  Administered 2016-06-13: 2 mg via INTRAVENOUS
  Administered 2016-06-13: 4 mg via INTRAVENOUS
  Administered 2016-06-13: 2 mg via INTRAVENOUS
  Administered 2016-06-13: 4 mg via INTRAVENOUS
  Filled 2016-06-10 (×2): qty 1
  Filled 2016-06-10 (×4): qty 2
  Filled 2016-06-10: qty 1
  Filled 2016-06-10: qty 2
  Filled 2016-06-10 (×2): qty 1
  Filled 2016-06-10 (×5): qty 2
  Filled 2016-06-10 (×2): qty 1

## 2016-06-10 MED ORDER — FAT EMULSION 20 % IV EMUL
240.0000 mL | INTRAVENOUS | Status: AC
  Administered 2016-06-10: 240 mL via INTRAVENOUS
  Filled 2016-06-10: qty 250

## 2016-06-10 MED ORDER — DAKINS (1/4 STRENGTH) 0.125 % EX SOLN
Freq: Two times a day (BID) | CUTANEOUS | Status: DC
  Administered 2016-06-10: 21:00:00
  Filled 2016-06-10: qty 473

## 2016-06-10 MED ORDER — INSULIN ASPART 100 UNIT/ML ~~LOC~~ SOLN
0.0000 [IU] | Freq: Four times a day (QID) | SUBCUTANEOUS | Status: DC
  Administered 2016-06-11 – 2016-06-14 (×7): 1 [IU] via SUBCUTANEOUS

## 2016-06-10 MED FILL — Fentanyl Citrate Preservative Free (PF) Inj 100 MCG/2ML: INTRAMUSCULAR | Qty: 2 | Status: AC

## 2016-06-10 NOTE — Progress Notes (Signed)
Peripherally Inserted Central Catheter/Midline Placement  The IV Nurse has discussed with the patient and/or persons authorized to consent for the patient, the purpose of this procedure and the potential benefits and risks involved with this procedure.  The benefits include less needle sticks, lab draws from the catheter, and the patient may be discharged home with the catheter. Risks include, but not limited to, infection, bleeding, blood clot (thrombus formation), and puncture of an artery; nerve damage and irregular heartbeat and possibility to perform a PICC exchange if needed/ordered by physician.  Alternatives to this procedure were also discussed.  Bard Power PICC patient education guide, fact sheet on infection prevention and patient information card has been provided to patient /or left at bedside.    PICC/Midline Placement Documentation        Darlyn Read 06/10/2016, 8:56 AM

## 2016-06-10 NOTE — Progress Notes (Signed)
Patient ID: Juan Wheeler, male   DOB: 06/11/1961, 55 y.o.   MRN: [id number] [facility] Surgery Progress Note:   3 Days Post-Op  Subjective: Mental status is clear.  Discussed surgery at [facility] where Dr. Gillie Manners did his colon cancer surgery Objective: Vital signs in last 24 hours: Temp:  [97.8 F (36.6 C)-98.1 F (36.7 C)] 97.8 F (36.6 C) (09/13 1039) Pulse Rate:  [89-112] 97 (09/13 1039) Resp:  [18-20] 20 (09/13 1039) BP: (121-144)/(69-90) 142/69 (09/13 1039) SpO2:  [95 %-98 %] 96 % (09/13 1039) Weight:  [83 kg (182 lb 15.7 oz)] 83 kg (182 lb 15.7 oz) (09/13 0543)  Intake/Output from previous day: 09/12 0701 - 09/13 0700 In: 222 [I.V.:122; IV Piggyback:100] Out: 2550 [Urine:2550] Intake/Output this shift: Total I/O In: -  Out: 200 [Urine:200]  Physical Exam: Work of breathing is normal.  Bottom is tender and he is reluctant for more examination.    Lab Results:  Results for orders placed or performed during the hospital encounter of 06/05/16 (from the past 48 hour(s))  Heparin level (unfractionated)     Status: Abnormal   Collection Time: 06/08/16 12:23 PM  Result Value Ref Range   Heparin Unfractionated 0.12 (L) 0.30 - 0.70 IU/mL    Comment:        IF HEPARIN RESULTS ARE BELOW EXPECTED VALUES, AND PATIENT DOSAGE HAS BEEN CONFIRMED, SUGGEST FOLLOW UP TESTING OF ANTITHROMBIN III LEVELS.   Glucose, capillary     Status: Abnormal   Collection Time: 06/08/16  3:36 PM  Result Value Ref Range   Glucose-Capillary 105 (H) 65 - 99 mg/dL   Comment 1 Notify RN   Heparin level (unfractionated)     Status: Abnormal   Collection Time: 06/08/16  7:55 PM  Result Value Ref Range   Heparin Unfractionated 0.12 (L) 0.30 - 0.70 IU/mL    Comment:        IF HEPARIN RESULTS ARE BELOW EXPECTED VALUES, AND PATIENT DOSAGE HAS BEEN CONFIRMED, SUGGEST FOLLOW UP TESTING OF ANTITHROMBIN III LEVELS.   Glucose, capillary     Status: Abnormal   Collection Time: 06/08/16  8:52 PM  Result  Value Ref Range   Glucose-Capillary 109 (H) 65 - 99 mg/dL  Heparin level (unfractionated)     Status: None   Collection Time: 06/09/16  9:28 AM  Result Value Ref Range   Heparin Unfractionated 0.38 0.30 - 0.70 IU/mL    Comment:        IF HEPARIN RESULTS ARE BELOW EXPECTED VALUES, AND PATIENT DOSAGE HAS BEEN CONFIRMED, SUGGEST FOLLOW UP TESTING OF ANTITHROMBIN III LEVELS.   CBC     Status: Abnormal   Collection Time: 06/09/16  9:28 AM  Result Value Ref Range   WBC 15.1 (H) 4.0 - 10.5 K/uL   RBC 4.38 4.22 - 5.81 MIL/uL   Hemoglobin 14.0 13.0 - 17.0 g/dL   HCT 41.0 39.0 - 52.0 %   MCV 93.6 78.0 - 100.0 fL   MCH 32.0 26.0 - 34.0 pg   MCHC 34.1 30.0 - 36.0 g/dL   RDW 15.0 11.5 - 15.5 %   Platelets 332 150 - 400 K/uL  Basic metabolic panel     Status: Abnormal   Collection Time: 06/09/16  9:28 AM  Result Value Ref Range   Sodium 139 135 - 145 mmol/L   Potassium 4.2 3.5 - 5.1 mmol/L   Chloride 106 101 - 111 mmol/L   CO2 25 22 - 32 mmol/L   Glucose, Bld 120 (  H) 65 - 99 mg/dL   BUN 7 6 - 20 mg/dL   Creatinine, Ser 1.02 0.61 - 1.24 mg/dL   Calcium 8.6 (L) 8.9 - 10.3 mg/dL   GFR calc non Af Amer >60 >60 mL/min   GFR calc Af Amer >60 >60 mL/min    Comment: (NOTE) The eGFR has been calculated using the CKD EPI equation. This calculation has not been validated in all clinical situations. eGFR's persistently <60 mL/min signify possible Chronic Kidney Disease.    Anion gap 8 5 - 15  Magnesium     Status: None   Collection Time: 06/09/16  9:28 AM  Result Value Ref Range   Magnesium 1.9 1.7 - 2.4 mg/dL  Phosphorus     Status: None   Collection Time: 06/09/16  9:28 AM  Result Value Ref Range   Phosphorus 2.9 2.5 - 4.6 mg/dL  TSH     Status: None   Collection Time: 06/09/16 10:38 AM  Result Value Ref Range   TSH 2.467 0.350 - 4.500 uIU/mL  MRSA PCR Screening     Status: None   Collection Time: 06/09/16 11:31 AM  Result Value Ref Range   MRSA by PCR NEGATIVE NEGATIVE     Comment:        The GeneXpert MRSA Assay (FDA approved for NASAL specimens only), is one component of a comprehensive MRSA colonization surveillance program. It is not intended to diagnose MRSA infection nor to guide or monitor treatment for MRSA infections.   Heparin level (unfractionated)     Status: None   Collection Time: 06/10/16  6:26 AM  Result Value Ref Range   Heparin Unfractionated 0.47 0.30 - 0.70 IU/mL    Comment:        IF HEPARIN RESULTS ARE BELOW EXPECTED VALUES, AND PATIENT DOSAGE HAS BEEN CONFIRMED, SUGGEST FOLLOW UP TESTING OF ANTITHROMBIN III LEVELS.   CBC     Status: Abnormal   Collection Time: 06/10/16  6:26 AM  Result Value Ref Range   WBC 12.4 (H) 4.0 - 10.5 K/uL   RBC 4.50 4.22 - 5.81 MIL/uL   Hemoglobin 14.5 13.0 - 17.0 g/dL   HCT 42.4 39.0 - 52.0 %   MCV 94.2 78.0 - 100.0 fL   MCH 32.2 26.0 - 34.0 pg   MCHC 34.2 30.0 - 36.0 g/dL   RDW 15.0 11.5 - 15.5 %   Platelets 356 150 - 400 K/uL  Comprehensive metabolic panel     Status: Abnormal   Collection Time: 06/10/16  6:26 AM  Result Value Ref Range   Sodium 137 135 - 145 mmol/L   Potassium 4.1 3.5 - 5.1 mmol/L   Chloride 105 101 - 111 mmol/L   CO2 23 22 - 32 mmol/L   Glucose, Bld 107 (H) 65 - 99 mg/dL   BUN 7 6 - 20 mg/dL   Creatinine, Ser 1.11 0.61 - 1.24 mg/dL   Calcium 8.7 (L) 8.9 - 10.3 mg/dL   Total Protein 6.3 (L) 6.5 - 8.1 g/dL   Albumin 2.3 (L) 3.5 - 5.0 g/dL   AST 39 15 - 41 U/L   ALT 39 17 - 63 U/L   Alkaline Phosphatase 55 38 - 126 U/L   Total Bilirubin 1.2 0.3 - 1.2 mg/dL   GFR calc non Af Amer >60 >60 mL/min   GFR calc Af Amer >60 >60 mL/min    Comment: (NOTE) The eGFR has been calculated using the CKD EPI equation. This calculation has not been  validated in all clinical situations. eGFR's persistently <60 mL/min signify possible Chronic Kidney Disease.    Anion gap 9 5 - 15  Prealbumin     Status: Abnormal   Collection Time: 06/10/16  6:26 AM  Result Value Ref Range    Prealbumin 8.8 (L) 18 - 38 mg/dL  Magnesium     Status: None   Collection Time: 06/10/16  6:26 AM  Result Value Ref Range   Magnesium 2.0 1.7 - 2.4 mg/dL  Phosphorus     Status: None   Collection Time: 06/10/16  6:26 AM  Result Value Ref Range   Phosphorus 3.3 2.5 - 4.6 mg/dL  Differential     Status: Abnormal   Collection Time: 06/10/16  6:26 AM  Result Value Ref Range   Neutrophils Relative % 75 %   Lymphocytes Relative 15 %   Monocytes Relative 6 %   Eosinophils Relative 3 %   Basophils Relative 1 %   Neutro Abs 9.3 (H) 1.7 - 7.7 K/uL   Lymphs Abs 1.9 0.7 - 4.0 K/uL   Monocytes Absolute 0.7 0.1 - 1.0 K/uL   Eosinophils Absolute 0.4 0.0 - 0.7 K/uL   Basophils Absolute 0.1 0.0 - 0.1 K/uL   Smear Review MORPHOLOGY UNREMARKABLE   Triglycerides     Status: None   Collection Time: 06/10/16  6:26 AM  Result Value Ref Range   Triglycerides 144 <150 mg/dL    Radiology/Results: Dg Chest Port 1 View  Result Date: 06/10/2016 CLINICAL DATA:  Evaluate central line placement, shortness of breath EXAM: PORTABLE CHEST 1 VIEW COMPARISON:  Portable chest x-ray of 06/06/2016 FINDINGS: A right PICC line is now present with the tip seen to the lower SVC above the expected right atrial junction. No pneumothorax is noted. Mild pulmonary vascular congestion cannot be excluded. However, no focal infiltrate or effusion is seen. The heart is within normal limits in size. IMPRESSION: 1. Right PICC line tip is seen to the lower SVC.  No pneumothorax. 2. Question mild pulmonary vascular congestion. Electronically Signed   By: Ivar Drape M.D.   On: 06/10/2016 10:45    Anti-infectives: Anti-infectives    Start     Dose/Rate Route Frequency Ordered Stop   06/05/16 2200  piperacillin-tazobactam (ZOSYN) IVPB 3.375 g     3.375 g 12.5 mL/hr over 240 Minutes Intravenous Every 8 hours 06/05/16 1602     06/05/16 1800  clindamycin (CLEOCIN) IVPB 600 mg  Status:  Discontinued     600 mg 100 mL/hr over 30 Minutes  Intravenous Every 6 hours 06/05/16 1453 06/08/16 1155   06/05/16 1630  vancomycin (VANCOCIN) IVPB 750 mg/150 ml premix  Status:  Discontinued     750 mg 150 mL/hr over 60 Minutes Intravenous Every 12 hours 06/05/16 1605 06/08/16 1155   06/05/16 1615  piperacillin-tazobactam (ZOSYN) IVPB 3.375 g     3.375 g 100 mL/hr over 30 Minutes Intravenous  Once 06/05/16 1601 06/05/16 1744   06/05/16 1400  piperacillin-tazobactam (ZOSYN) IVPB 3.375 g  Status:  Discontinued     3.375 g 12.5 mL/hr over 240 Minutes Intravenous Every 8 hours 06/05/16 0937 06/05/16 1553   06/05/16 0800  clindamycin (CLEOCIN) IVPB 600 mg     600 mg 100 mL/hr over 30 Minutes Intravenous  Once 06/05/16 0759 06/05/16 0846   06/05/16 0645  vancomycin (VANCOCIN) IVPB 1000 mg/200 mL premix     1,000 mg 200 mL/hr over 60 Minutes Intravenous  Once 06/05/16 0637 06/05/16 0846  06/05/16 0645  piperacillin-tazobactam (ZOSYN) IVPB 3.375 g     3.375 g 12.5 mL/hr over 240 Minutes Intravenous  Once 06/05/16 5993 06/05/16 0825      Assessment/Plan: Problem List: Patient Active Problem List   Diagnosis Date Noted  . AKI (acute kidney injury) ([place])   . Fournier's gangrene in male 06/05/2016  . Abscess 06/05/2016  . Sepsis ([place])   . Necrotizing fasciitis ([place])   . Central venous catheter in place   . Acute respiratory failure ([place])   . PAD (peripheral artery disease) ([place]) 07/02/2015  . Preoperative cardiovascular examination 06/12/2015  . Cardiomyopathy, ischemic 06/12/2015  . Pulmonary nodules 09/30/2014  . ASCVD (arteriosclerotic cardiovascular disease) 05/02/2013  . Unstable angina ([place]) 04/18/2013  . Chest pain 03/29/2013  . Tobacco abuse 03/29/2013  . Chronic pain syndrome 11/09/2012  . Pain in joint, pelvic region and thigh 11/09/2012  . Neuralgia and neuritis 10/12/2012  . Atherosclerosis of native arteries of extremity with intermittent claudication ([place]) 08/19/2012  . Backache 03/24/2012  . Peripheral vascular  disease ([place]) 12/04/2011  . Compression of vein 12/04/2011  . Heartburn 12/03/2011  . Personal history of digestive disease 11/19/2011  . Depressive disorder 09/24/2011  . Dysuria 08/31/2011  . Impotence of organic origin 08/31/2011  . Urinary frequency 08/31/2011  . Constipation 06/05/2011  . Essential hypertension 06/05/2011  . Anal or rectal pain 06/05/2011  . Malignant neoplasm of rectum ([place]) 12/02/2010    Mentioned diverting colostomy to him-permanent or temporary.  Will defer to Dr. [name] about timing.   3 Days Post-Op    LOS: 5 days   Matt B. Hassell Done, MD, [facility] [phone] beeper [phone]  06/10/2016 12:14 PM

## 2016-06-10 NOTE — Progress Notes (Addendum)
PARENTERAL NUTRITION CONSULT NOTE - INITIAL  Pharmacy Consult:  TPN Indication:  Fistula to the buttocks  Allergies  Allergen Reactions  . Darvocet [Propoxyphene N-Acetaminophen] Palpitations  . Propoxyphene Palpitations    Patient Measurements: Height: '5\' 9"'$  (175.3 cm) Weight: 182 lb 15.7 oz (83 kg) IBW/kg (Calculated) : 70.7  Vital Signs: Temp: 97.8 F (36.6 C) (09/13 0543) Temp Source: Oral (09/13 0543) BP: 136/79 (09/13 0543) Pulse Rate: 89 (09/13 0543) Intake/Output from previous day: 09/12 0701 - 09/13 0700 In: -  Out: 2550 [Urine:2550] Intake/Output from this shift: No intake/output data recorded.  Labs:  Recent Labs  06/08/16 0448 06/09/16 0928  WBC 14.9* 15.1*  HGB 12.3* 14.0  HCT 35.7* 41.0  PLT 240 332     Recent Labs  06/09/16 0928 06/10/16 0626  NA 139 137  K 4.2 4.1  CL 106 105  CO2 25 23  GLUCOSE 120* 107*  BUN 7 7  CREATININE 1.02 1.11  CALCIUM 8.6* 8.7*  MG 1.9 2.0  PHOS 2.9 3.3  PROT  --  6.3*  ALBUMIN  --  2.3*  AST  --  39  ALT  --  39  ALKPHOS  --  55  BILITOT  --  1.2  TRIG  --  144   Estimated Creatinine Clearance: 74.3 mL/min (by C-G formula based on SCr of 1.11 mg/dL).    Recent Labs  06/08/16 1136 06/08/16 1536 06/08/16 2052  GLUCAP 90 105* 109*    Medical History: Past Medical History:  Diagnosis Date  . Chronic lower back pain    a. Followed by pain management at [facility].  . Colon cancer [facility])    rectal cancer  . Coronary artery disease    a. 03/2013: abnl nuc -> LHC s/p DES to LCx, residual moderate disease in LAD (med rx unless refractory angina). b. Not on BB due to bradycardia.  Marland Kitchen DVT (deep venous thrombosis) ([place]) ~ 2013  . GERD (gastroesophageal reflux disease)   . History of blood transfusion    "once; after throwing up alot of blood" (04/17/2013)  . Hypertension   . LV dysfunction    a. EF 45% in 03/2013.  Marland Kitchen PAD (peripheral artery disease) ([place])    a. Occlusion of the right internal iliac  artery, with significant atherosclerosis in the left internal iliac which was not amenable to reconstruction per notes from [facility].  . Pulmonary nodules 09/30/2014  . Rectal cancer ([place])   . Tobacco abuse       Insulin Requirements in the past 24 hours:  Not on SSI  Assessment: 37 YOM with history of colon and rectal cancer post chemo and ileostomy with closure in 2012.  Patient presented to [facility] on 06/05/16 with left buttock pain and transferred to [facility] for further management.  Now s/p multiple I&D's with no further I&D's planned.  On 06/09/16, found to have fistula to the buttocks and Pharmacy consulted to initiate TPN for nutritional support.  Noted patient reported no recent weight loss and was eating less during the hospitalization to avoid having diarrhea.  GI: GERD - Pepcid to be added toTPN Endo: TSH WNL.  No hx DM - CBGs controlled Lytes: all WNL Renal: SCr 1.11, CrCL 74 ml/min, BUN WNL Pulm: nodules / tobacco - 2L [place] Cards: CAD / HTN / PAD - BP controlled, some tachy - Imdur, Lopressor Hepatobil: LFTs / tbili / TG WNL AC: Heparin for new-onset Afib - HL therapeutic, no bleeding Neuro: chronic back  pain - PRN oxycodone/morphine ID: Zosyn for gluteal abscess s/p mult I&Ds, s/p vanc/clinda - afebrile Best Practices: heparin gtt TPN Access: PICC to be placed  TPN start date: 06/10/16  Current Nutrition:   NPO  Nutritional Goals:  2100-2300 kCal, 110-125 grams of protein per day   Plan:  - AFTER PICC placed, - Start Clinimix E 5/15 at 40 ml/hr (goal 100 ml/hr) and lipids at 10 ml/hr - Daily multivitamin and trace elements in TPN - Pepcid '40mg'$  in TPN - Add Vitamin C '500mg'$  and Zinc '5mg'$  to help with wound healing - Start sensitive SSI Q6H.  D/C if CBGs remain controlled at goal TPN rate. - F/U AM labs   Anaia Frith D. Mina Marble, PharmD, BCPS Pager:  [phone] 06/10/2016, 8:35 AM

## 2016-06-10 NOTE — Progress Notes (Addendum)
[facility] PROGRESS NOTE  Juan Wheeler OIZ:[id number] DOB: [birth year] DOA: 06/05/2016 PCP: [name], MD  Interim summary and HPI 55 year old male admitted from [facility] 9/8 with a left buttock abscess concerning for necrotizing fasciitis status post surgical I&D. Transferred to [facility] 9/8 for Severe sepsis. He clinically improved with Abx and IVF. Levophed was weaned off.  I and D done on 9/10. Had intermittent afib and aflutter while in ICU.  Now stable and extubated.  Transfer to telemetry bed and to [facility] hospital on 9/12  Assessment/Plan: 1-Sepsis due to left buttock abscess/cellulitis:  with current complications of Fistula  -s/p I&D X 2 -general surgery on board and have updated patient and are discussing further options -continue IV zosyn  -patient afebrile and with WBC's trending down appropriately -will follow rec's and continue supportive care -sepsis features resolving/resolved  2-new diagnosis of PAF: patient A. Fib/A. Flutter on telemetry -rate controlled currently -continue metoprolol and heparin for now -patient with normal TSH and normal 2-D echo -CHADsVASC score 3 -will need outpatient follow up with cardiology service; conversion to oral anticoagulation once all procedures needed have been completed  3-essential HTN -continue imdur and metoprolol  4-AKI: in setting of sepsis  -Cr back to normal range now -maintain adequate hydration and perfusion -follow BMET intermittently  5-hx of adenocarcinoma of the rectum: -s/p resection [facility] and currently in remission -will continue outpatient follow up with Hem/Onc   6-chronic pain syndrome -continue outpatient pain medication regimen   7-GERD -will continue famotidine   Code Status: Full Family Communication: no family at bedside  Disposition Plan: to be determine; continue current antibiotics and follow general surgery rec's   Consultants:  PCCM  General surgery   Procedures and  significant events : 9/8 CT Pelvis >> CT pelvis obtained which demonstrated changes consistent with significant cellulitis in the left buttock with diffuse irregular air collection medially within the buttock extending into the gluteal muscles and posterior upper left thigh/pelvic cavity. No definitive fluid component identified at that time. 9/08  Admit with left buttock abscess, concern for necrotizing fasciitis. Had I&D at [facility] by Dr. Arnoldo Morale 9/10  Had further debridement and cleaning by [facility] at [facility] 9/8>>9/10: intubated  Antibiotics: Zosyn 9/8 >>  Vanco 9/8 >> 9/11 Clinda 9/8 >> 9/11  HPI/Subjective:  Pain in bottom, sometimes 9/10 tol diet-soft and liquid diet Family and patient aware of further needs for possible surgery and patient contemplating optiopns   Objective: Vitals:   06/10/16 1039 06/10/16 1352  BP: (!) 142/69 116/85  Pulse: 97 90  Resp: 20 18  Temp: 97.8 F (36.6 C) 98.2 F (36.8 C)    Intake/Output Summary (Last 24 hours) at 06/10/16 1611 Last data filed at 06/10/16 1513  Gross per 24 hour  Intake            415.6 ml  Output             2300 ml  Net          -1884.4 ml   Filed Weights   06/07/16 0434 06/08/16 0400 06/10/16 0543  Weight: 90.3 kg (199 lb 1.2 oz) 88.6 kg (195 lb 5.2 oz) 83 kg (182 lb 15.7 oz)    Exam:   General:  Afebrile, denies CP, SOB and palpitations. Patient is complaining of pain in left buttocks.  Cardiovascular: no rubs, no gallops, irregular, no murmurs appreciated  Respiratory: CTA bilaterally  Abdomen: soft, NT, positive BS  Musculoskeletal: no Le edema, no cyanosis  Skin: left buttock area with large open wound, no erythema or drainage appreciated. This morning had BM and wound/packing got all dirty. Surgery is coming to assist nurse with wound cleaning/care.  Data Reviewed: Basic Metabolic Panel:  Recent Labs Lab 06/06/16 0014 06/06/16 0443 06/06/16 2309 06/09/16 0928 06/10/16 0626  NA 135 136 133* 139 137   K 4.9 4.8 4.1 4.2 4.1  CL 106 107 104 106 105  CO2 '23 23 22 25 23  '$ GLUCOSE 142* 133* 93 120* 107*  BUN '11 10 9 7 7  '$ CREATININE 1.36* 1.33* 1.20 1.02 1.11  CALCIUM 7.5* 7.5* 7.6* 8.6* 8.7*  MG 1.9  --  1.9 1.9 2.0  PHOS  --   --   --  2.9 3.3   Liver Function Tests:  Recent Labs Lab 06/05/16 0636 06/06/16 0443 06/10/16 0626  AST 40 35 39  ALT 26 25 39  ALKPHOS 73 60 55  BILITOT 1.4* 0.8 1.2  PROT 7.0 4.9* 6.3*  ALBUMIN 2.8* 1.8* 2.3*   CBC:  Recent Labs Lab 06/05/16 0636 06/05/16 0649 06/06/16 0443 06/08/16 0448 06/09/16 0928 06/10/16 0626  WBC 20.7*  --  18.0* 14.9* 15.1* 12.4*  NEUTROABS 16.6*  --  15.0*  --   --  9.3*  HGB 15.7 17.0 12.5* 12.3* 14.0 14.5  HCT 44.1 50.0 37.3* 35.7* 41.0 42.4  MCV 93.4  --  95.2 93.7 93.6 94.2  PLT 183  --  177 240 332 356   Cardiac Enzymes:  Recent Labs Lab 06/06/16 0014 06/06/16 0930  TROPONINI 0.58* 0.04*   CBG:  Recent Labs Lab 06/08/16 0417 06/08/16 0742 06/08/16 1136 06/08/16 1536 06/08/16 2052  GLUCAP 125* 78 90 105* 109*    Recent Results (from the past 240 hour(s))  Blood culture (routine x 2)     Status: None   Collection Time: 06/05/16  6:45 AM  Result Value Ref Range Status   Specimen Description BLOOD RIGHT FOREARM DRAWN BY RN YW  Final   Special Requests BOTTLES DRAWN AEROBIC ONLY 5CC  Final   Culture NO GROWTH 5 DAYS  Final   Report Status 06/10/2016 FINAL  Final  Blood culture (routine x 2)     Status: None   Collection Time: 06/05/16  7:40 AM  Result Value Ref Range Status   Specimen Description BLOOD LEFT HAND DRAWN BY RN RH  Final   Special Requests BOTTLES DRAWN AEROBIC AND ANAEROBIC 5CC EACH  Final   Culture NO GROWTH 5 DAYS  Final   Report Status 06/10/2016 FINAL  Final  Aerobic/Anaerobic Culture (surgical/deep wound)     Status: None   Collection Time: 06/05/16 10:36 AM  Result Value Ref Range Status   Specimen Description BUTTOCKS LEFT  Final   Special Requests NONE  Final    Gram Stain   Final    RARE WBC PRESENT,BOTH PMN AND MONONUCLEAR NO ORGANISMS SEEN    Culture   Final    RARE PEPTOSTREPTOCOCCUS SPECIES FEW BACTEROIDES VULGATUS BETA LACTAMASE NEGATIVE Performed at [facility]    Report Status 06/09/2016 FINAL  Final  Culture, respiratory (NON-Expectorated)     Status: None   Collection Time: 06/05/16  5:08 PM  Result Value Ref Range Status   Specimen Description TRACHEAL ASPIRATE  Final   Special Requests NONE  Final   Gram Stain   Final    MODERATE WBC PRESENT,BOTH PMN AND MONONUCLEAR NO ORGANISMS SEEN    Culture Consistent with normal respiratory flora.  Final   Report Status 06/08/2016 FINAL  Final  MRSA PCR Screening     Status: None   Collection Time: 06/09/16 11:31 AM  Result Value Ref Range Status   MRSA by PCR NEGATIVE NEGATIVE Final    Comment:        The GeneXpert MRSA Assay (FDA approved for NASAL specimens only), is one component of a comprehensive MRSA colonization surveillance program. It is not intended to diagnose MRSA infection nor to guide or monitor treatment for MRSA infections.      Studies: Dg Chest Port 1 View  Result Date: 06/10/2016 CLINICAL DATA:  Evaluate central line placement, shortness of breath EXAM: PORTABLE CHEST 1 VIEW COMPARISON:  Portable chest x-ray of 06/06/2016 FINDINGS: A right PICC line is now present with the tip seen to the lower SVC above the expected right atrial junction. No pneumothorax is noted. Mild pulmonary vascular congestion cannot be excluded. However, no focal infiltrate or effusion is seen. The heart is within normal limits in size. IMPRESSION: 1. Right PICC line tip is seen to the lower SVC.  No pneumothorax. 2. Question mild pulmonary vascular congestion. Electronically Signed   By: Ivar Drape M.D.   On: 06/10/2016 10:45    Scheduled Meds: . insulin aspart  0-9 Units Subcutaneous Q6H  . isosorbide mononitrate  15 mg Oral Daily  . metoprolol tartrate  25 mg Oral BID  .  piperacillin-tazobactam (ZOSYN)  IV  3.375 g Intravenous Q8H   Continuous Infusions: . Marland KitchenTPN (CLINIMIX-E) Adult     And  . fat emulsion    . heparin 1,850 Units/hr (06/10/16 0155)    Active Problems:   Fournier's gangrene in male   Abscess   Sepsis ([place])   Necrotizing fasciitis ([place])   Central venous catheter in place   Acute respiratory failure ([place])   AKI (acute kidney injury) ([place])   Time spent: 20 minutes  Verneita Griffes, MD [facility] [phone]

## 2016-06-10 NOTE — Plan of Care (Signed)
Problem: Education: Goal: Knowledge of [facility] Education information/materials will improve Outcome: Progressing Reviewed POC with pt., and also re: dressing changes.

## 2016-06-10 NOTE — Progress Notes (Signed)
3 Days Post-Op  Subjective: Ongoing soreness and pain left buttocks.  No abdominal pain, nausea or vomiting.   Objective: Vital signs in last 24 hours: Temp:  [97.8 F (36.6 C)-98.1 F (36.7 C)] 97.8 F (36.6 C) (09/13 1039) Pulse Rate:  [89-112] 97 (09/13 1039) Resp:  [18-20] 20 (09/13 1039) BP: (121-144)/(69-90) 142/69 (09/13 1039) SpO2:  [95 %-98 %] 96 % (09/13 1039) Weight:  [83 kg (182 lb 15.7 oz)] 83 kg (182 lb 15.7 oz) (09/13 0543) Last BM Date: 06/09/16 NPO No IV fluid recorded 2550 urine Stool x 2 recorded Afebrile, VSS Labs OK this AM WBC 12.4 CXR this AM to look at [facility] shows some mild pulmonary congestion; PICC lower SVC   Intake/Output from previous day: 09/12 0701 - 09/13 0700 In: 222 [I.V.:122; IV Piggyback:100] Out: 2550 [Urine:2550] Intake/Output this shift: Total I/O In: -  Out: 200 [Urine:200]  General appearance: alert, cooperative and no distress GI: soft, non-tender; bowel sounds normal; no masses,  no organomegaly  Lab Results:   Recent Labs  06/09/16 0928 06/10/16 0626  WBC 15.1* 12.4*  HGB 14.0 14.5  HCT 41.0 42.4  PLT 332 356    BMET  Recent Labs  06/09/16 0928 06/10/16 0626  NA 139 137  K 4.2 4.1  CL 106 105  CO2 25 23  GLUCOSE 120* 107*  BUN 7 7  CREATININE 1.02 1.11  CALCIUM 8.6* 8.7*   PT/INR No results for input(s): LABPROT, INR in the last 72 hours.   Recent Labs Lab 06/05/16 0636 06/06/16 0443 06/10/16 0626  AST 40 35 39  ALT 26 25 39  ALKPHOS 73 60 55  BILITOT 1.4* 0.8 1.2  PROT 7.0 4.9* 6.3*  ALBUMIN 2.8* 1.8* 2.3*     Lipase  No results found for: LIPASE   Studies/Results: Dg Chest Port 1 View  Result Date: 06/10/2016 CLINICAL DATA:  Evaluate central line placement, shortness of breath EXAM: PORTABLE CHEST 1 VIEW COMPARISON:  Portable chest x-ray of 06/06/2016 FINDINGS: A right PICC line is now present with the tip seen to the lower SVC above the expected right atrial junction. No pneumothorax is  noted. Mild pulmonary vascular congestion cannot be excluded. However, no focal infiltrate or effusion is seen. The heart is within normal limits in size. IMPRESSION: 1. Right PICC line tip is seen to the lower SVC.  No pneumothorax. 2. Question mild pulmonary vascular congestion. Electronically Signed   By: Ivar Drape M.D.   On: 06/10/2016 10:45    Medications: . famotidine  20 mg Per Tube BID  . insulin aspart  0-9 Units Subcutaneous Q6H  . isosorbide mononitrate  15 mg Oral Daily  . metoprolol tartrate  25 mg Oral BID  . piperacillin-tazobactam (ZOSYN)  IV  3.375 g Intravenous Q8H   . Marland KitchenTPN (CLINIMIX-E) Adult     And  . fat emulsion    . heparin 1,850 Units/hr (06/10/16 0155)   Assessment/Plan Septic shock with necrotizing fasciitis - admitted to [facility] 06/05/16 I&D left Buttocks 06/05/16 Dr. Aviva Signs - [facility] Transfer [facility] 06/05/16- CONSULT Dr. Donnie Mesa DEBRIDEMENT of skin, subcutaneous tissue, and muscle of left buttock, pulsatile lavage, 06/07/16 Dr. Greer Pickerel Finding of a colonic fistula from the buttocks abscess site Hx of colon and rectal adenocarcinoma with oral chemotherapy, LAR with diverting ileostomy 2012 at [place] center Atrial fibrillation FEN:  NPO starting TNA today ID:  Zosyn day 6 she has competed 3 days of Clindamycin and  Vancomycin on 06/07/16 DVT:  Heparin drip for AF  Plan:  Review with Dr. [name], he want treatment here in town.  Discussed a diverting colostomy with him.    LOS: 5 days    Ermine Spofford 06/10/2016 [phone]

## 2016-06-10 NOTE — Progress Notes (Signed)
Nutrition Follow-up  DOCUMENTATION CODES:   Not applicable  INTERVENTION:   -D/c Ensure Enlive po BID, each supplement provides 350 kcal and 20 grams of protein, due to NPO status -RD will monitor for diet advancement -TPN management per pharmacy  NUTRITION DIAGNOSIS:   Increased nutrient needs related to wound healing as evidenced by estimated needs.  Ongoing  GOAL:   Patient will meet greater than or equal to 90% of their needs  Progressing  MONITOR:   Diet advancement, Labs, Weight trends, Skin, I & O's  REASON FOR ASSESSMENT:   Consult New TPN/TNA  ASSESSMENT:   55 year old male with a history of rectal cancer managed at [facility] by low anterior resection with diverting loop ileostomy/ s/p ileostomy reversal in 2014, who presented to [facility] ED this morning with left buttock pain. S/P I&D of left buttock for Fournier's gangrene.   RD received consult for new TPN.   Per surgical team notes, stool coming out of buttocks abcess yesterday, suspicious for fistula to buttocks. Per PT notes, pt is not appropriate for hydrotherapy at this time. Pt now NPO.   PICC line placed earlier this AM. Plan to start TPN at 1800 today. Per pharmacy note, plan to initiate Start Clinimix E 5/15 at 40 ml/hr (goal 100 ml/hr) and lipids at 10 ml/hr, which provides 1162 kcals and 48 grams protein (55% of estimated kcal needs and 44% of estimated protein needs). TPN at goal rate to provide 2184 kcals and 120 grams protein, which meets 100% of estimated kcal and protein needs.   Medications reviewed. Noted vitamin C and zinc added per pharmacy.   Case discussed with RN.   Labs reviewed.   Diet Order:  Diet NPO time specified Except for: Ice Chips, Sips with Meds TPN (CLINIMIX-E) Adult  Skin:  Wound (see comment) (Left buttock Fournier's gangrene S/P I&D)  Last BM:  06/10/16  Height:   Ht Readings from Last 1 Encounters:  06/08/16 '5\' 9"'$  (1.753 m)    Weight:   Wt  Readings from Last 1 Encounters:  06/10/16 182 lb 15.7 oz (83 kg)    Ideal Body Weight:  72.7 kg  BMI:  Body mass index is 27.02 kg/m.  Estimated Nutritional Needs:   Kcal:  2100-2300  Protein:  110-125 grams  Fluid:  2.1-2.3 L  EDUCATION NEEDS:   No education needs identified at this time  Keita Demarco A. Jimmye Norman, RD, LDN, CDE Pager: [phone] After hours Pager: [phone]

## 2016-06-10 NOTE — Progress Notes (Signed)
[place] for Heparin Indication: atrial fibrillation  Allergies  Allergen Reactions  . Darvocet [Propoxyphene N-Acetaminophen] Palpitations  . Propoxyphene Palpitations    Patient Measurements: Height: '5\' 9"'$  (175.3 cm) Weight: 182 lb 15.7 oz (83 kg) IBW/kg (Calculated) : 70.7 Heparin Dosing Weight: 81 kg  Vital Signs: Temp: 97.8 F (36.6 C) (09/13 0543) Temp Source: Oral (09/13 0543) BP: 136/79 (09/13 0543) Pulse Rate: 89 (09/13 0543)  Labs:  Recent Labs  06/08/16 0448  06/08/16 1955 06/09/16 0928 06/10/16 0626  HGB 12.3*  --   --  14.0 14.5  HCT 35.7*  --   --  41.0 42.4  PLT 240  --   --  332 356  HEPARINUNFRC  --   < > 0.12* 0.38 0.47  CREATININE  --   --   --  1.02 1.11  < > = values in this interval not displayed.  Estimated Creatinine Clearance: 74.3 mL/min (by C-G formula based on SCr of 1.11 mg/dL).  Assessment: 55 year old male admitted 9/8 with a left gluteal abscess S/P multiple surgical I&D. Continues on heparin for new onset Afib - will likely require long-term AC.    Heparin level remains therapeutic at 0.47. CBC WNL and stable. No overt bleeding reported.   Goal of Therapy:  Heparin level 0.3-0.7 units/ml Monitor platelets by anticoagulation protocol: Yes   Plan:  Continue heparin at 1850 units/hr Monitor HL, CBC, s/sx bleeding F/U long-term [facility] plans   Stephens November, PharmD Clinical Pharmacist 9:09 AM, 06/10/2016

## 2016-06-11 ENCOUNTER — Encounter (HOSPITAL_COMMUNITY)
Admission: EM | Disposition: A | Discharge status: Skilled Nursing Facility | Payer: Self-pay | Source: Home / Self Care | Attending: Internal Medicine

## 2016-06-11 ENCOUNTER — Encounter (HOSPITAL_COMMUNITY): Payer: Self-pay | Admitting: Surgery

## 2016-06-11 ENCOUNTER — Inpatient Hospital Stay (HOSPITAL_COMMUNITY): Payer: Commercial Managed Care - HMO | Admitting: Anesthesiology

## 2016-06-11 HISTORY — PX: LAPAROSCOPIC PARTIAL COLECTOMY: SHX5907

## 2016-06-11 LAB — HEPARIN LEVEL (UNFRACTIONATED): Heparin Unfractionated: 0.3 IU/mL (ref 0.30–0.70)

## 2016-06-11 LAB — GLUCOSE, CAPILLARY
Glucose-Capillary: 116 mg/dL — ABNORMAL HIGH (ref 65–99)
Glucose-Capillary: 124 mg/dL — ABNORMAL HIGH (ref 65–99)
Glucose-Capillary: 133 mg/dL — ABNORMAL HIGH (ref 65–99)

## 2016-06-11 LAB — COMPREHENSIVE METABOLIC PANEL
ALT: 37 U/L (ref 17–63)
AST: 33 U/L (ref 15–41)
Albumin: 2.1 g/dL — ABNORMAL LOW (ref 3.5–5.0)
Alkaline Phosphatase: 51 U/L (ref 38–126)
Anion gap: 7 (ref 5–15)
BUN: 10 mg/dL (ref 6–20)
CO2: 26 mmol/L (ref 22–32)
Calcium: 8.3 mg/dL — ABNORMAL LOW (ref 8.9–10.3)
Chloride: 106 mmol/L (ref 101–111)
Creatinine, Ser: 1.11 mg/dL (ref 0.61–1.24)
GFR calc Af Amer: >60 mL/min (ref >60)
GFR calc non Af Amer: >60 mL/min (ref >60)
Glucose, Bld: 120 mg/dL — ABNORMAL HIGH (ref 65–99)
Potassium: 3.6 mmol/L (ref 3.5–5.1)
Sodium: 139 mmol/L (ref 135–145)
Total Bilirubin: 1.2 mg/dL (ref 0.3–1.2)
Total Protein: 5.7 g/dL — ABNORMAL LOW (ref 6.5–8.1)

## 2016-06-11 LAB — MAGNESIUM: Magnesium: 2.1 mg/dL (ref 1.7–2.4)

## 2016-06-11 LAB — PHOSPHORUS: Phosphorus: 3.4 mg/dL (ref 2.5–4.6)

## 2016-06-11 LAB — CBC
HCT: 37.8 % — ABNORMAL LOW (ref 39.0–52.0)
Hemoglobin: 12.9 g/dL — ABNORMAL LOW (ref 13.0–17.0)
MCH: 32 pg (ref 26.0–34.0)
MCHC: 34.1 g/dL (ref 30.0–36.0)
MCV: 93.8 fL (ref 78.0–100.0)
Platelets: 373 10*3/uL (ref 150–400)
RBC: 4.03 MIL/uL — ABNORMAL LOW (ref 4.22–5.81)
RDW: 14.6 % (ref 11.5–15.5)
WBC: 13.1 10*3/uL — ABNORMAL HIGH (ref 4.0–10.5)

## 2016-06-11 LAB — SURGICAL PCR SCREEN
MRSA, PCR: NEGATIVE
Staphylococcus aureus: NEGATIVE

## 2016-06-11 SURGERY — LAPAROSCOPIC PARTIAL COLECTOMY
Anesthesia: General | Site: Abdomen

## 2016-06-11 MED ORDER — SODIUM CHLORIDE 0.9 % IR SOLN
Status: DC | PRN
  Administered 2016-06-11: 1000 mL

## 2016-06-11 MED ORDER — HYDROMORPHONE HCL 1 MG/ML IJ SOLN: 0.2500 mg | INTRAMUSCULAR | Status: DC | PRN

## 2016-06-11 MED ORDER — PHENYLEPHRINE 40 MCG/ML (10ML) SYRINGE FOR IV PUSH (FOR BLOOD PRESSURE SUPPORT)
PREFILLED_SYRINGE | INTRAVENOUS | Status: DC | PRN
  Administered 2016-06-11 (×2): 80 ug via INTRAVENOUS

## 2016-06-11 MED ORDER — FENTANYL CITRATE (PF) 100 MCG/2ML IJ SOLN
INTRAMUSCULAR | Status: DC | PRN
  Administered 2016-06-11 (×4): 50 ug via INTRAVENOUS

## 2016-06-11 MED ORDER — FENTANYL CITRATE (PF) 100 MCG/2ML IJ SOLN
INTRAMUSCULAR | Status: AC
  Filled 2016-06-11: qty 4

## 2016-06-11 MED ORDER — PROMETHAZINE HCL 25 MG/ML IJ SOLN
6.2500 mg | INTRAMUSCULAR | Status: DC | PRN
Start: 2016-06-11 — End: 2016-06-11

## 2016-06-11 MED ORDER — MIDAZOLAM HCL 5 MG/5ML IJ SOLN
INTRAMUSCULAR | Status: DC | PRN
  Administered 2016-06-11: 1 mg via INTRAVENOUS

## 2016-06-11 MED ORDER — ZINC TRACE METAL 1 MG/ML IV SOLN
INTRAVENOUS | Status: AC
  Administered 2016-06-11 (×2): via INTRAVENOUS
  Filled 2016-06-11: qty 1920

## 2016-06-11 MED ORDER — BUPIVACAINE-EPINEPHRINE 0.25% -1:200000 IJ SOLN
INTRAMUSCULAR | Status: DC | PRN
  Administered 2016-06-11: 1 mL

## 2016-06-11 MED ORDER — SUGAMMADEX SODIUM 200 MG/2ML IV SOLN
INTRAVENOUS | Status: DC | PRN
  Administered 2016-06-11: 200 mg via INTRAVENOUS

## 2016-06-11 MED ORDER — SUCCINYLCHOLINE CHLORIDE 200 MG/10ML IV SOSY
PREFILLED_SYRINGE | INTRAVENOUS | Status: DC | PRN
  Administered 2016-06-11: 100 mg via INTRAVENOUS

## 2016-06-11 MED ORDER — PROPOFOL 10 MG/ML IV BOLUS
INTRAVENOUS | Status: AC
  Filled 2016-06-11: qty 40

## 2016-06-11 MED ORDER — BUPIVACAINE-EPINEPHRINE (PF) 0.25% -1:200000 IJ SOLN
INTRAMUSCULAR | Status: AC
  Filled 2016-06-11: qty 30

## 2016-06-11 MED ORDER — 0.9 % SODIUM CHLORIDE (POUR BTL) OPTIME
TOPICAL | Status: DC | PRN
  Administered 2016-06-11 (×2): 1000 mL

## 2016-06-11 MED ORDER — MIDAZOLAM HCL 2 MG/2ML IJ SOLN
INTRAMUSCULAR | Status: AC
  Filled 2016-06-11: qty 2

## 2016-06-11 MED ORDER — FAT EMULSION 20 % IV EMUL
240.0000 mL | INTRAVENOUS | Status: AC
Start: 2016-06-11 — End: 2016-06-12
  Administered 2016-06-11 (×2): 240 mL via INTRAVENOUS
  Filled 2016-06-11: qty 250

## 2016-06-11 MED ORDER — LIDOCAINE 2% (20 MG/ML) 5 ML SYRINGE
INTRAMUSCULAR | Status: DC | PRN
  Administered 2016-06-11: 60 mg via INTRAVENOUS

## 2016-06-11 MED ORDER — PROPOFOL 10 MG/ML IV BOLUS
INTRAVENOUS | Status: DC | PRN
  Administered 2016-06-11: 140 mg via INTRAVENOUS

## 2016-06-11 MED ORDER — LACTATED RINGERS IV SOLN
INTRAVENOUS | Status: DC | PRN
Start: 2016-06-11 — End: 2016-06-11
  Administered 2016-06-11 (×2): via INTRAVENOUS

## 2016-06-11 MED ORDER — PHENYLEPHRINE HCL 10 MG/ML IJ SOLN
INTRAVENOUS | Status: DC | PRN
  Administered 2016-06-11: 25 ug/min via INTRAVENOUS

## 2016-06-11 MED ORDER — ROCURONIUM BROMIDE 10 MG/ML (PF) SYRINGE
PREFILLED_SYRINGE | INTRAVENOUS | Status: DC | PRN
  Administered 2016-06-11: 50 mg via INTRAVENOUS
  Administered 2016-06-11: 10 mg via INTRAVENOUS

## 2016-06-11 MED ORDER — ALBUMIN HUMAN 5 % IV SOLN
INTRAVENOUS | Status: DC | PRN
  Administered 2016-06-11: 17:00:00 via INTRAVENOUS

## 2016-06-11 SURGICAL SUPPLY — 76 items
APPLIER CLIP ROT 10 11.4 M/L (STAPLE)
BLADE SURG ROTATE 9660 (MISCELLANEOUS) ×2 IMPLANT
CANISTER SUCTION 2500CC (MISCELLANEOUS) ×4 IMPLANT
CELLS DAT CNTRL 66122 CELL SVR (MISCELLANEOUS) IMPLANT
CHLORAPREP W/TINT 26ML (MISCELLANEOUS) ×4 IMPLANT
CLIP APPLIE ROT 10 11.4 M/L (STAPLE) IMPLANT
COVER MAYO STAND STRL (DRAPES) ×6 IMPLANT
COVER SURGICAL LIGHT HANDLE (MISCELLANEOUS) ×6 IMPLANT
DRAPE PROXIMA HALF (DRAPES) ×4 IMPLANT
DRAPE UTILITY XL STRL (DRAPES) ×14 IMPLANT
DRAPE WARM FLUID 44X44 (DRAPE) ×2 IMPLANT
DRSG KUZMA FLUFF (GAUZE/BANDAGES/DRESSINGS) ×2 IMPLANT
DRSG OPSITE POSTOP 4X10 (GAUZE/BANDAGES/DRESSINGS) IMPLANT
DRSG OPSITE POSTOP 4X8 (GAUZE/BANDAGES/DRESSINGS) IMPLANT
DRSG PAD ABDOMINAL 8X10 ST (GAUZE/BANDAGES/DRESSINGS) ×2 IMPLANT
ELECT BLADE 6.5 EXT (BLADE) ×6 IMPLANT
ELECT CAUTERY BLADE 6.4 (BLADE) ×8 IMPLANT
ELECT REM PT RETURN 9FT ADLT (ELECTROSURGICAL) ×4
ELECTRODE REM PT RTRN 9FT ADLT (ELECTROSURGICAL) ×2 IMPLANT
GEL ULTRASOUND 20GR AQUASONIC (MISCELLANEOUS) IMPLANT
GLOVE BIOGEL PI IND STRL 6.5 (GLOVE) ×1 IMPLANT
GLOVE BIOGEL PI IND STRL 8 (GLOVE) ×4 IMPLANT
GLOVE BIOGEL PI INDICATOR 6.5 (GLOVE) ×1
GLOVE BIOGEL PI INDICATOR 8 (GLOVE) ×4
GLOVE ECLIPSE 7.5 STRL STRAW (GLOVE) ×8 IMPLANT
GLOVE SURG SS PI 6.5 STRL IVOR (GLOVE) ×2 IMPLANT
GOWN STRL REUS W/ TWL LRG LVL3 (GOWN DISPOSABLE) ×10 IMPLANT
GOWN STRL REUS W/ TWL XL LVL3 (GOWN DISPOSABLE) ×4 IMPLANT
GOWN STRL REUS W/TWL LRG LVL3 (GOWN DISPOSABLE) ×10
GOWN STRL REUS W/TWL XL LVL3 (GOWN DISPOSABLE) ×4
KIT BASIN OR (CUSTOM PROCEDURE TRAY) ×4 IMPLANT
KIT ROOM TURNOVER OR (KITS) ×2 IMPLANT
LEGGING LITHOTOMY PAIR STRL (DRAPES) IMPLANT
LIQUID BAND (GAUZE/BANDAGES/DRESSINGS) ×2 IMPLANT
NS IRRIG 1000ML POUR BTL (IV SOLUTION) ×8 IMPLANT
PAD ARMBOARD 7.5X6 YLW CONV (MISCELLANEOUS) ×8 IMPLANT
PENCIL BUTTON HOLSTER BLD 10FT (ELECTRODE) ×6 IMPLANT
RELOAD PROXIMATE 75MM BLUE (ENDOMECHANICALS) ×4 IMPLANT
RTRCTR WOUND ALEXIS 18CM MED (MISCELLANEOUS)
SCALPEL HARMONIC ACE (MISCELLANEOUS) ×4 IMPLANT
SCISSORS LAP 5X35 DISP (ENDOMECHANICALS) ×4 IMPLANT
SET IRRIG TUBING LAPAROSCOPIC (IRRIGATION / IRRIGATOR) ×2 IMPLANT
SHEARS HARMONIC ACE PLUS 36CM (ENDOMECHANICALS) ×2 IMPLANT
SLEEVE ENDOPATH XCEL 5M (ENDOMECHANICALS) ×6 IMPLANT
SPECIMEN JAR LARGE (MISCELLANEOUS) ×4 IMPLANT
SPONGE LAP 18X18 X RAY DECT (DISPOSABLE) IMPLANT
STAPLER PROXIMATE 75MM BLUE (STAPLE) ×2 IMPLANT
STAPLER VISISTAT 35W (STAPLE) ×2 IMPLANT
SUCTION POOLE TIP (SUCTIONS) ×2 IMPLANT
SURGILUBE 2OZ TUBE FLIPTOP (MISCELLANEOUS) IMPLANT
SUT MON AB 4-0 PC3 18 (SUTURE) ×2 IMPLANT
SUT PDS AB 0 CT 36 (SUTURE) ×6 IMPLANT
SUT PDS AB 1 TP1 96 (SUTURE) IMPLANT
SUT PROLENE 2 0 CT2 30 (SUTURE) IMPLANT
SUT PROLENE 2 0 KS (SUTURE) IMPLANT
SUT SILK 2 0 SH CR/8 (SUTURE) ×4 IMPLANT
SUT SILK 2 0 TIES 10X30 (SUTURE) ×4 IMPLANT
SUT SILK 3 0 SH CR/8 (SUTURE) ×4 IMPLANT
SUT SILK 3 0 TIES 10X30 (SUTURE) ×4 IMPLANT
SUT VIC AB 3-0 SH 18 (SUTURE) ×4 IMPLANT
SYR BULB IRRIGATION 50ML (SYRINGE) ×4 IMPLANT
SYS LAPSCP GELPORT 120MM (MISCELLANEOUS)
SYSTEM LAPSCP GELPORT 120MM (MISCELLANEOUS) IMPLANT
TAPE CLOTH SURG 6X10 WHT LF (GAUZE/BANDAGES/DRESSINGS) ×2 IMPLANT
TOWEL OR 17X26 10 PK STRL BLUE (TOWEL DISPOSABLE) ×8 IMPLANT
TRAY FOLEY CATH 16FRSI W/METER (SET/KITS/TRAYS/PACK) ×4 IMPLANT
TRAY LAPAROSCOPIC MC (CUSTOM PROCEDURE TRAY) ×4 IMPLANT
TRAY PROCTOSCOPIC FIBER OPTIC (SET/KITS/TRAYS/PACK) ×2 IMPLANT
TROCAR XCEL 12X100 BLDLESS (ENDOMECHANICALS) IMPLANT
TROCAR XCEL BLUNT TIP 100MML (ENDOMECHANICALS) IMPLANT
TROCAR XCEL NON-BLD 11X100MML (ENDOMECHANICALS) IMPLANT
TROCAR XCEL NON-BLD 5MMX100MML (ENDOMECHANICALS) ×4 IMPLANT
TUBE CONNECTING 12X1/4 (SUCTIONS) ×8 IMPLANT
TUBING FILTER THERMOFLATOR (ELECTROSURGICAL) ×4 IMPLANT
TUBING INSUFFLATION (TUBING) ×4 IMPLANT
YANKAUER SUCT BULB TIP NO VENT (SUCTIONS) ×8 IMPLANT

## 2016-06-11 NOTE — Care Management Important Message (Signed)
Important Message  Patient Details  Name: Juan Wheeler MRN: [id number] Date of Birth: 1961-05-13   Medicare Important Message Given:  Yes    [name] 06/11/2016, 11:14 AM

## 2016-06-11 NOTE — Anesthesia Procedure Notes (Signed)
Procedure Name: Intubation Date/Time: 06/11/2016 3:59 PM Performed by: Everlean Cherry A Pre-anesthesia Checklist: Patient identified, Emergency Drugs available, Suction available and Patient being monitored Patient Re-evaluated:Patient Re-evaluated prior to inductionOxygen Delivery Method: Circle system utilized Preoxygenation: Pre-oxygenation with 100% oxygen Intubation Type: IV induction and Rapid sequence Laryngoscope Size: Miller and 2 Grade View: Grade I Tube type: Oral Tube size: 7.5 mm Number of attempts: 1 Airway Equipment and Method: Stylet Placement Confirmation: ETT inserted through vocal cords under direct vision,  positive ETCO2 and breath sounds checked- equal and bilateral Secured at: 23 cm Tube secured with: Tape Dental Injury: Teeth and Oropharynx as per pre-operative assessment

## 2016-06-11 NOTE — Progress Notes (Signed)
06/11/16  1000  Patient wound on his buttocks had saturated curlex, two abd pads, a towel and one bed pad with blood. Dr Verlon Au notified. Dr Verlon Au and Surgery Will, PA came to the room to look at the wound.  Will irrigated the wound with NS and packed it with curlex and applied ABD pad. Pt scheduled for surgery this afternoon for colostomy. Per Will he will have surgery MD to look at his buttock.

## 2016-06-11 NOTE — Anesthesia Preprocedure Evaluation (Addendum)
Anesthesia Evaluation  Patient identified by MRN, date of birth, ID band Patient awake    Reviewed: Allergy & Precautions, NPO status , Patient's Chart, lab work & pertinent test results  Airway Mallampati: II  TM Distance: >3 FB Neck ROM: Full    Dental no notable dental hx.    Pulmonary neg pulmonary ROS, Current Smoker,    Pulmonary exam normal breath sounds clear to auscultation       Cardiovascular hypertension, + CAD, + Cardiac Stents and + Peripheral Vascular Disease  Normal cardiovascular exam Rhythm:Regular Rate:Normal  Left ventricle: The cavity size was normal. Wall thickness was   normal. Systolic function was normal. The estimated ejection   fraction was in the range of 50% to 55%. - Left atrium: The atrium was mildly dilated. - Right atrium: The atrium was mildly dilated.  9/17   Neuro/Psych negative neurological ROS  negative psych ROS   GI/Hepatic negative GI ROS, Neg liver ROS,   Endo/Other  negative endocrine ROS  Renal/GU negative Renal ROS  negative genitourinary   Musculoskeletal negative musculoskeletal ROS (+)   Abdominal   Peds negative pediatric ROS (+)  Hematology negative hematology ROS (+)   Anesthesia Other Findings   Reproductive/Obstetrics negative OB ROS                            Anesthesia Physical Anesthesia Plan  ASA: III  Anesthesia Plan: General   Post-op Pain Management:    Induction: Intravenous  Airway Management Planned: Oral ETT  Additional Equipment:   Intra-op Plan:   Post-operative Plan: Possible Post-op intubation/ventilation  Informed Consent: I have reviewed the patients History and Physical, chart, labs and discussed the procedure including the risks, benefits and alternatives for the proposed anesthesia with the patient or authorized representative who has indicated his/her understanding and acceptance.   Dental advisory  given  Plan Discussed with: CRNA and Surgeon  Anesthesia Plan Comments:        Anesthesia Quick Evaluation

## 2016-06-11 NOTE — Transfer of Care (Signed)
Immediate Anesthesia Transfer of Care Note  Patient: Juan Wheeler  Procedure(s) Performed: Procedure(s): LAPAROSCOPIC  OPEN COLOSTOMY (N/A)  Patient Location: PACU  Anesthesia Type:General  Level of Consciousness: awake, alert  and oriented  Airway & Oxygen Therapy: Patient Spontanous Breathing and Patient connected to nasal cannula oxygen  Post-op Assessment: Report given to RN and Post -op Vital signs reviewed and stable  Post vital signs: Reviewed and stable  Last Vitals:  Vitals:   06/11/16 1446 06/11/16 1830  BP: 117/71 122/67  Pulse: 94 73  Resp: 17 17  Temp: 36.8 C 36.7 C    Last Pain:  Vitals:   06/11/16 1830  TempSrc:   PainSc: (P) 0-No pain      Patients Stated Pain Goal: 2 (99/83/38 2505)  Complications: No apparent anesthesia complications

## 2016-06-11 NOTE — Anesthesia Postprocedure Evaluation (Signed)
Anesthesia Post Note  Patient: Juan Wheeler  Procedure(s) Performed: Procedure(s) (LRB): LAPAROSCOPIC  OPEN COLOSTOMY (N/A)  Patient location during evaluation: PACU Anesthesia Type: General Level of consciousness: awake and alert Pain management: pain level controlled Vital Signs Assessment: post-procedure vital signs reviewed and stable Respiratory status: spontaneous breathing, nonlabored ventilation, respiratory function stable and patient connected to nasal cannula oxygen Cardiovascular status: blood pressure returned to baseline and stable Postop Assessment: no signs of nausea or vomiting Anesthetic complications: no    Last Vitals:  Vitals:   06/11/16 1845 06/11/16 1900  BP: (!) 110/48 118/63  Pulse: 78 72  Resp: 18 18  Temp:      Last Pain:  Vitals:   06/11/16 1830  TempSrc:   PainSc: 0-No pain                 Zenaida Deed

## 2016-06-11 NOTE — Progress Notes (Signed)
Pharmacy Antibiotic Note Juan Wheeler is a 55 y.o. male admitted on 06/05/2016 with infected gluteal abscess s/p mult I&D.  Antibiotics to narrow to Zosyn monotherapy. Patient's renal function is stable.   Plan: - Continue zosyn 3.375gm IV Q8H, 4 hr infusion - Monitor renal fxn, clinical progress, further de-escalation  Height: '5\' 9"'$  (175.3 cm) Weight: 182 lb 15.7 oz (83 kg) IBW/kg (Calculated) : 70.7  Temp (24hrs), Avg:97.9 F (36.6 C), Min:97.8 F (36.6 C), Max:98.2 F (36.8 C)   Recent Labs Lab 06/05/16 0649 06/05/16 2305  06/06/16 0443 06/06/16 2309 06/08/16 0448 06/09/16 0928 06/10/16 0626 06/11/16 0500  WBC  --   --   --  18.0*  --  14.9* 15.1* 12.4* 13.1*  CREATININE 2.00*  --   < > 1.33* 1.20  --  1.02 1.11 1.11  LATICACIDVEN 2.57* 1.2  --   --   --   --   --   --   --   < > = values in this interval not displayed.  Estimated Creatinine Clearance: 74.3 mL/min (by C-G formula based on SCr of 1.11 mg/dL).    Allergies  Allergen Reactions  . Darvocet [Propoxyphene N-Acetaminophen] Palpitations  . Propoxyphene Palpitations    Antimicrobials this admission: 9/8 Zosyn >>  9/8 Vancomycin  >> 9/11 9/8 Clindamycin >> 9/11  Dose adjustments this admission: 9/8 TA - negative 9/8 Blood x 2: NGTD 9/8 left buttocks - holding for possible anaerobes   Maryanna Shape, PharmD, BCPS  Clinical Pharmacist  Pager: [phone]   06/11/2016, 9:25 AM

## 2016-06-11 NOTE — Progress Notes (Signed)
[facility] PROGRESS NOTE  Zaine Elsass UXL:[id number] DOB: 12-17-1960 DOA: 06/05/2016 PCP: [name], MD  Interim summary and HPI 55 year old male admitted from [facility] 9/8 with a left buttock abscess concerning for necrotizing fasciitis status post surgical I&D. Transferred to [facility] 9/8 for Severe sepsis. He clinically improved with Abx and IVF. Levophed was weaned off.  I and D done on 9/10. Had intermittent afib and aflutter while in ICU.  Now stable and extubated.  Transfer to telemetry bed and to [facility] hospital on 9/12  Assessment/Plan: 1-Sepsis due to left buttock abscess/cellulitis:  with current complications of Fistula  -s/p I&D X 2 -general surgery on board, for diverting colectomy 9/14 -continue IV zosyn  -patient afebrile and with WBC's trending down appropriately  2-new diagnosis of PAF: patient A. Fib/A. Flutter on telemetry -rate controlled currently -continue metoprolol  -heparin to resume post-procedure as per gen surgery -patient with normal TSH and normal 2-D echo -CHADsVASC score 3 -will need outpatient follow up with cardiology service; conversion to oral anticoagulation once all procedures needed have been completed  3-essential HTN -continue imdur and metoprolol  4-AKI: in setting of sepsis  -Cr back to normal range now -maintain adequate hydration and perfusion  5-hx of adenocarcinoma of the rectum: -s/p resection [facility] and currently in remission -will continue outpatient follow up with Hem/Onc   6-chronic pain syndrome -continue outpatient pain medication regimen   7-GERD -will continue famotidine   Code Status: Full Family Communication: briefly d/w family at bedside earlier today Disposition Plan: to be determine; continue current antibiotics and follow general surgery rec's   Consultants:  PCCM  General surgery   Procedures and significant events : 9/8 CT Pelvis >> CT pelvis obtained which demonstrated changes  consistent with significant cellulitis in the left buttock with diffuse irregular air collection medially within the buttock extending into the gluteal muscles and posterior upper left thigh/pelvic cavity. No definitive fluid component identified at that time. 9/08  Admit with left buttock abscess, concern for necrotizing fasciitis. Had I&D at [facility] by Dr. Arnoldo Morale 9/10  Had further debridement and cleaning by [facility] at [facility] 9/8>>9/10: intubated  Antibiotics: Zosyn 9/8 >>  Vanco 9/8 >> 9/11 Clinda 9/8 >> 9/11  HPI/Subjective:  Bleeding to buttocks earlier int he day Pain tolerable to some extent Npo and waiting for surgery   Objective: Vitals:   06/11/16 1014 06/11/16 1446  BP: 125/74 117/71  Pulse: 67 94  Resp:  17  Temp:  98.3 F (36.8 C)    Intake/Output Summary (Last 24 hours) at 06/11/16 1506 Last data filed at 06/11/16 0939  Gross per 24 hour  Intake           790.66 ml  Output             1425 ml  Net          -634.34 ml   Filed Weights   06/07/16 0434 06/08/16 0400 06/10/16 0543  Weight: 90.3 kg (199 lb 1.2 oz) 88.6 kg (195 lb 5.2 oz) 83 kg (182 lb 15.7 oz)    Exam:   General:  Afebrile, denies CP, SOB and palpitations.   Cardiovascular: no rubs, no gallops, irregular, no murmurs appreciated  Respiratory: CTA bilaterally  Abdomen: soft, NT, positive BS  Musculoskeletal: no Le edema, no cyanosis   Skin: left buttock area with large open wound, Bleeding ++.  Seems to have dried  Data Reviewed: Basic Metabolic Panel:  Recent Labs Lab 06/06/16 0014 06/06/16 0443 06/06/16  2309 06/09/16 0928 06/10/16 0626 06/11/16 0500  NA 135 136 133* 139 137 139  K 4.9 4.8 4.1 4.2 4.1 3.6  CL 106 107 104 106 105 106  CO2 '23 23 22 25 23 26  '$ GLUCOSE 142* 133* 93 120* 107* 120*  BUN '11 10 9 7 7 10  '$ CREATININE 1.36* 1.33* 1.20 1.02 1.11 1.11  CALCIUM 7.5* 7.5* 7.6* 8.6* 8.7* 8.3*  MG 1.9  --  1.9 1.9 2.0 2.1  PHOS  --   --   --  2.9 3.3 3.4   Liver Function  Tests:  Recent Labs Lab 06/05/16 0636 06/06/16 0443 06/10/16 0626 06/11/16 0500  AST 40 35 39 33  ALT 26 25 39 37  ALKPHOS 73 60 55 51  BILITOT 1.4* 0.8 1.2 1.2  PROT 7.0 4.9* 6.3* 5.7*  ALBUMIN 2.8* 1.8* 2.3* 2.1*   CBC:  Recent Labs Lab 06/05/16 0636  06/06/16 0443 06/08/16 0448 06/09/16 0928 06/10/16 0626 06/11/16 0500  WBC 20.7*  --  18.0* 14.9* 15.1* 12.4* 13.1*  NEUTROABS 16.6*  --  15.0*  --   --  9.3*  --   HGB 15.7  < > 12.5* 12.3* 14.0 14.5 12.9*  HCT 44.1  < > 37.3* 35.7* 41.0 42.4 37.8*  MCV 93.4  --  95.2 93.7 93.6 94.2 93.8  PLT 183  --  177 240 332 356 373  < > = values in this interval not displayed. Cardiac Enzymes:  Recent Labs Lab 06/06/16 0014 06/06/16 0930  TROPONINI 0.58* 0.04*   CBG:  Recent Labs Lab 06/08/16 2052 06/10/16 2143 06/11/16 0043 06/11/16 0513 06/11/16 1147  GLUCAP 109* 114* 133* 116* 124*    Recent Results (from the past 240 hour(s))  Blood culture (routine x 2)     Status: None   Collection Time: 06/05/16  6:45 AM  Result Value Ref Range Status   Specimen Description BLOOD RIGHT FOREARM DRAWN BY RN YW  Final   Special Requests BOTTLES DRAWN AEROBIC ONLY 5CC  Final   Culture NO GROWTH 5 DAYS  Final   Report Status 06/10/2016 FINAL  Final  Blood culture (routine x 2)     Status: None   Collection Time: 06/05/16  7:40 AM  Result Value Ref Range Status   Specimen Description BLOOD LEFT HAND DRAWN BY RN RH  Final   Special Requests BOTTLES DRAWN AEROBIC AND ANAEROBIC 5CC EACH  Final   Culture NO GROWTH 5 DAYS  Final   Report Status 06/10/2016 FINAL  Final  Aerobic/Anaerobic Culture (surgical/deep wound)     Status: None   Collection Time: 06/05/16 10:36 AM  Result Value Ref Range Status   Specimen Description BUTTOCKS LEFT  Final   Special Requests NONE  Final   Gram Stain   Final    RARE WBC PRESENT,BOTH PMN AND MONONUCLEAR NO ORGANISMS SEEN    Culture   Final    RARE PEPTOSTREPTOCOCCUS SPECIES FEW  BACTEROIDES VULGATUS BETA LACTAMASE NEGATIVE Performed at [facility]    Report Status 06/09/2016 FINAL  Final  Culture, respiratory (NON-Expectorated)     Status: None   Collection Time: 06/05/16  5:08 PM  Result Value Ref Range Status   Specimen Description TRACHEAL ASPIRATE  Final   Special Requests NONE  Final   Gram Stain   Final    MODERATE WBC PRESENT,BOTH PMN AND MONONUCLEAR NO ORGANISMS SEEN    Culture Consistent with normal respiratory flora.  Final   Report Status  06/08/2016 FINAL  Final  MRSA PCR Screening     Status: None   Collection Time: 06/09/16 11:31 AM  Result Value Ref Range Status   MRSA by PCR NEGATIVE NEGATIVE Final    Comment:        The GeneXpert MRSA Assay (FDA approved for NASAL specimens only), is one component of a comprehensive MRSA colonization surveillance program. It is not intended to diagnose MRSA infection nor to guide or monitor treatment for MRSA infections.   Surgical pcr screen     Status: None   Collection Time: 06/11/16  5:11 AM  Result Value Ref Range Status   MRSA, PCR NEGATIVE NEGATIVE Final   Staphylococcus aureus NEGATIVE NEGATIVE Final    Comment:        The Xpert SA Assay (FDA approved for NASAL specimens in patients over 33 years of age), is one component of a comprehensive surveillance program.  Test performance has been validated by [facility] for patients greater than or equal to 13 year old. It is not intended to diagnose infection nor to guide or monitor treatment.      Studies: Dg Chest Port 1 View  Result Date: 06/10/2016 CLINICAL DATA:  Evaluate central line placement, shortness of breath EXAM: PORTABLE CHEST 1 VIEW COMPARISON:  Portable chest x-ray of 06/06/2016 FINDINGS: A right PICC line is now present with the tip seen to the lower SVC above the expected right atrial junction. No pneumothorax is noted. Mild pulmonary vascular congestion cannot be excluded. However, no focal infiltrate or  effusion is seen. The heart is within normal limits in size. IMPRESSION: 1. Right PICC line tip is seen to the lower SVC.  No pneumothorax. 2. Question mild pulmonary vascular congestion. Electronically Signed   By: Ivar Drape M.D.   On: 06/10/2016 10:45    Scheduled Meds: . insulin aspart  0-9 Units Subcutaneous Q6H  . isosorbide mononitrate  15 mg Oral Daily  . metoprolol tartrate  25 mg Oral BID  . piperacillin-tazobactam (ZOSYN)  IV  3.375 g Intravenous Q8H  . sodium hypochlorite   Irrigation BID   Continuous Infusions: . Marland KitchenTPN (CLINIMIX-E) Adult     And  . fat emulsion    . Marland KitchenTPN (CLINIMIX-E) Adult 40 mL/hr at 06/10/16 1718   And  . fat emulsion 240 mL (06/10/16 1718)    Active Problems:   Fournier's gangrene in male   Abscess   Sepsis ([place])   Necrotizing fasciitis ([place])   Central venous catheter in place   Acute respiratory failure ([place])   AKI (acute kidney injury) ([place])   Time spent: 20 minutes  Verneita Griffes, MD [facility] [phone]

## 2016-06-11 NOTE — Consult Note (Addendum)
CCS team following for assessment and plan of care for rectal fistula.   [place] Nurse requested for preoperative stoma site marking; pt plans on going to surgery today.  He states he is familiar with ostomy care since he previously had one, which has been reversed.  He states the surgeon told him the site would need to be marked on the right side of his abd, related to the previous location/surgical history.  Pt states he is unable to sit/stand on the side of the bed today.  Assessed patient while lying, and sitting upright in the bed and leaning forward. There are creases which appear when he leans forward which should be avoided if possible.  Discussed surgical procedure and stoma creation with patient.  Explained role of the [place] nurse team.  Provided the patient with educational booklet/DVD and provided samples of pouching options.  Answered patient questions.   Examined patient in order to place the marking in the patient's visual field, away from any creases or abdominal contour issues and within the rectus muscle.  Unable to mark below the patient's belt line, since he wears his pants very low and there is a crease in that location.   Placed two possible stoma sites; Marked for ileostomy in the RLQ  _6___cm to the right of the umbilicus and  _4___ cm above/ umbilicus.  Marked for ileostomy in the RLQ  _7___cm to the right of the umbilicus and  _8___ cm below/ umbilicus.  Patient's abdomen cleansed with CHG wipes at site markings, allowed to air dry prior to marking.  [place] Nurse team will follow up with patient after surgery for continue ostomy care and teaching.  Please re-consult if further assistance is needed.  Thank-you,  Julien Girt MSN, [place], [place], [place], [place]

## 2016-06-11 NOTE — Progress Notes (Signed)
4 Days Post-Op  Subjective: Bleeding from buttocks wound, not sure howl long. He had allot of stool last exam, no blood so this is new to me.    Nursing staff stopped the heparin this AM at 0800.  I cleaned the site as well as I could with saline irrigation and repacked it.  There is still some clot along the edges, but no overt bleeding is seen.  He is so tender it is difficult to do much at the bedside.    Objective: Vital signs in last 24 hours: Temp:  [97.8 F (36.6 C)-98.2 F (36.8 C)] 97.8 F (36.6 C) (09/14 0712) Pulse Rate:  [64-90] 67 (09/14 1014) Resp:  [16-19] 16 (09/14 0712) BP: (116-139)/(74-85) 125/74 (09/14 1014) SpO2:  [93 %-98 %] 96 % (09/14 1014) Last BM Date: 06/10/16  Intake/Output from previous day: 09/13 0701 - 09/14 0700 In: 1058.3 [I.V.:868.3; IV Piggyback:150] Out: 1425 [Urine:1425] Intake/Output this shift: Total I/O In: -  Out: 500 [Urine:500]  General appearance: alert, cooperative, mild distress and site is very tender to any touch or manipulation Skin: Bleeding from the open abscess site left buttocks.  It had been packed and I took it down and examined it.  cleaned as well as I could and repacked it.   Lab Results:   Recent Labs  06/10/16 0626 06/11/16 0500  WBC 12.4* 13.1*  HGB 14.5 12.9*  HCT 42.4 37.8*  PLT 356 373    BMET  Recent Labs  06/10/16 0626 06/11/16 0500  NA 137 139  K 4.1 3.6  CL 105 106  CO2 23 26  GLUCOSE 107* 120*  BUN 7 10  CREATININE 1.11 1.11  CALCIUM 8.7* 8.3*   PT/INR No results for input(s): LABPROT, INR in the last 72 hours.   Recent Labs Lab 06/05/16 0636 06/06/16 0443 06/10/16 0626 06/11/16 0500  AST 40 35 39 33  ALT 26 25 39 37  ALKPHOS 73 60 55 51  BILITOT 1.4* 0.8 1.2 1.2  PROT 7.0 4.9* 6.3* 5.7*  ALBUMIN 2.8* 1.8* 2.3* 2.1*     Lipase  No results found for: LIPASE   Studies/Results: Dg Chest Port 1 View  Result Date: 06/10/2016 CLINICAL DATA:  Evaluate central line placement,  shortness of breath EXAM: PORTABLE CHEST 1 VIEW COMPARISON:  Portable chest x-ray of 06/06/2016 FINDINGS: A right PICC line is now present with the tip seen to the lower SVC above the expected right atrial junction. No pneumothorax is noted. Mild pulmonary vascular congestion cannot be excluded. However, no focal infiltrate or effusion is seen. The heart is within normal limits in size. IMPRESSION: 1. Right PICC line tip is seen to the lower SVC.  No pneumothorax. 2. Question mild pulmonary vascular congestion. Electronically Signed   By: Ivar Drape M.D.   On: 06/10/2016 10:45    Medications: . insulin aspart  0-9 Units Subcutaneous Q6H  . isosorbide mononitrate  15 mg Oral Daily  . metoprolol tartrate  25 mg Oral BID  . piperacillin-tazobactam (ZOSYN)  IV  3.375 g Intravenous Q8H  . sodium hypochlorite   Irrigation BID   . Marland KitchenTPN (CLINIMIX-E) Adult     And  . fat emulsion    . Marland KitchenTPN (CLINIMIX-E) Adult 40 mL/hr at 06/10/16 1718   And  . fat emulsion 240 mL (06/10/16 1718)    Assessment/Plan Septic shock with necrotizing fasciitis - admitted to [facility] 06/05/16 I&D left Buttocks 06/05/16 Dr. Aviva Signs - [facility] Transfer to [facility] 06/05/16- CONSULT  Dr. Donnie Mesa DEBRIDEMENT of skin, subcutaneous tissue, and muscle of left buttock, pulsatile lavage, 06/07/16 Dr. 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 Malnutrition - TNA FEN:  NPO/TNA  ID:  Zosyn day 6 she has competed 3 days of Clindamycin and Vancomycin on 06/07/16 DVT:  Heparin drip for AF    Plan:   For diverting Ostomy today.  I will let Dr. [name] know about the bleeding and let him look at abscess site while pt is asleep in the OR.     LOS: 6 days    Iisha Soyars 06/11/2016 [phone]

## 2016-06-11 NOTE — Progress Notes (Signed)
PARENTERAL NUTRITION CONSULT NOTE - FOLLOW UP  Pharmacy Consult:  TPN Indication:  Fistula from rectal anastomosis to buttock  Allergies  Allergen Reactions  . Darvocet [Propoxyphene N-Acetaminophen] Palpitations  . Propoxyphene Palpitations    Patient Measurements: Height: '5\' 9"'$  (175.3 cm) Weight: 182 lb 15.7 oz (83 kg) IBW/kg (Calculated) : 70.7  Vital Signs: Temp: 97.8 F (36.6 C) (09/14 0712) BP: 130/77 (09/14 0712) Pulse Rate: 64 (09/14 0712) Intake/Output from previous day: 09/13 0701 - 09/14 0700 In: 1058.3 [I.V.:868.3; IV Piggyback:150] Out: 1425 [Urine:1425] Intake/Output from this shift: Total I/O In: -  Out: 200 [Urine:200]  Labs:  Recent Labs  06/09/16 0928 06/10/16 0626 06/11/16 0500  WBC 15.1* 12.4* 13.1*  HGB 14.0 14.5 12.9*  HCT 41.0 42.4 37.8*  PLT 332 356 373     Recent Labs  06/09/16 0928 06/10/16 0626 06/11/16 0500  NA 139 137 139  K 4.2 4.1 3.6  CL 106 105 106  CO2 '25 23 26  '$ GLUCOSE 120* 107* 120*  BUN '7 7 10  '$ CREATININE 1.02 1.11 1.11  CALCIUM 8.6* 8.7* 8.3*  MG 1.9 2.0 2.1  PHOS 2.9 3.3 3.4  PROT  --  6.3* 5.7*  ALBUMIN  --  2.3* 2.1*  AST  --  39 33  ALT  --  39 37  ALKPHOS  --  55 51  BILITOT  --  1.2 1.2  PREALBUMIN  --  8.8*  --   TRIG  --  144  --    Estimated Creatinine Clearance: 74.3 mL/min (by C-G formula based on SCr of 1.11 mg/dL).    Recent Labs  06/10/16 2143 06/11/16 0043 06/11/16 0513  GLUCAP 114* 133* 116*     Insulin Requirements in the past 24 hours:  1 unit since TPN started  Assessment: 78 YOM with history of colon and rectal cancer post chemo and ileostomy with closure in 2012.  Patient presented to [facility] on 06/05/16 with left buttock pain and transferred to [facility] for further management.  Now s/p multiple I&D's with no further I&D's planned.  On 06/09/16, found to have fistula to the buttocks and Pharmacy consulted to initiate TPN for nutritional support.  Noted patient reported no recent weight  loss and was eating less during the hospitalization to avoid having diarrhea.  GI: GERD - Pepcid in TPN.  Baseline prealbumin low at 8.8.  BM x2.  Plan for transverse colostomy, condition may be reversible. Endo: TSH WNL.  No hx DM - CBGs controlled Lytes: all WNL Renal: SCr 1.11, CrCL 74 ml/min, BUN WNL - good UOP 0.7 ml/kg/hr Pulm: nodules / tobacco - [place] >> RA Cards: CAD / HTN / PAD - VSS - Imdur, Lopressor Hepatobil: LFTs / tbili / TG WNL AC: Heparin for new-onset Afib - HL therapeutic, CBC stable, no bleeding Neuro: chronic back pain - PRN oxycodone/morphine ID: s/p Zosyn, Vanc, Clinda for gluteal abscess, mult I&Ds - afebrile, WBC up 13.1 Best Practices: heparin gtt TPN Access: PICC placed 06/10/16 TPN start date: 06/10/16  Current Nutrition:   TPN  Nutritional Goals:  2100-2300 kCal, 110-125 grams of protein per day   Plan:  - Increase Clinimix E 5/15 to 80 ml/hr (goal 100 ml/hr) and continue lipids at 10 ml/hr.  TPN will meet 88% of minimal needs.  Advance to goal as appropriate. - Daily multivitamin and trace elements in TPN - Pepcid '40mg'$  in TPN - Add Vitamin C '500mg'$  and Zinc '5mg'$  to help with wound healing - Continue  sensitive SSI Q6H.  D/C if CBGs remain controlled at goal TPN rate. - F/U daily   Jazara Swiney D. Mina Marble, PharmD, BCPS Pager:  [phone] 06/11/2016, 8:32 AM

## 2016-06-11 NOTE — Op Note (Signed)
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 He has undergone 2 debridements and 2 days ago began draining large amounts of stool through the bite ocular wound. He has a significant history of cancer of the rectum status post preoperative chemoradiation therapy and low anterior resection at [facility] in 2012. He had a temporary diversion which was reversed at that time and apparently has done well until this illness. Preoperative CT prior to debridement of his buttock did show inflammatory change extending up toward the wall of the rectum in the area of his anastomosis. I have recommended proceeding initially with diverting colostomy. We discussed approaching this laparoscopically. I discussed indications and risks with the patient and his family detailed elsewhere and he is in agreement.    Procedure Detail:  Patient was brought to the operating room, placed in the supine position on the operating table, and general endotracheal anesthesia induced. He was already on broad-spectrum IV antibiotics. PAS were in place. The abdomen was widely sterilely prepped and draped. Patient timeout was performed and correct procedure verified. Access was obtained with a 5 mm Optiview trocar in the left upper quadrant without difficulty and pneumoperitoneum established. There were extensive omental adhesions in the upper abdomen and extensive small bowel adhesions including up to the anterior abdominal wall in the lower abdomen but these appeared to be below where we were planning his ostomy which was marked in the right upper quadrant by the stomal nurse. Under direct vision I  placed 2 further 5 mm trochars in the lateral left abdomen and left upper quadrant. An extensive adhesio lysis was then performed using sharp and blunt dissection and the Harmonic scalpel taking extensive omental adhesions down off the anterior abdominal wall and the anterior midline over to the right upper quadrant. There were very dense small bowel adhesions up to the anterior abdominal wall just inferior to my dissection but I stayed away from the small bowel bile indications. Eventually the entire omentum was taken down off the anterior abdominal wall and the planned colostomy site in the right upper quadrant completely cleared. I identified a mobile portion of transverse colon beneath this site. This was mobilized off of the omentum with the Harmonic scalpel and the lesser sac entered and a approximately 15 cm length of transverse colon mobilized away from the omentum to where it would reach up easily to the anterior abdominal wall. At this point a skin subcutaneous tenderness button was excised at the colostomy site and dissection carried down to the anterior fascia which was incised in a cruciate fashion and the rectus muscle bluntly split and the posterior sheath and peritoneum entered and dilated. The mobilized portion of the transverse colon which had been fixed with a grasper prior to opening the colostomy site was then brought up to the side and could easily be brought out under no tension. I completely divided the colon at this point with a single firing of the GIA 75 mm stapler and the distal portion of the colon was returned into the abdominal cavity. The proximal portion could be brought out easily as an end colostomy with plenty of distance under no tension. I did tighten up the fascial defect slightly with 1-0 PDS suture.  I then excised the staple line from the end of the colon and it was matured and everted with interrupted 3-0 Vicryl sutures. I took another look laparoscopically and there was no  twisting or bleeding or any problems seen with the ostomy or dissection and no evidence of injury. All CO2 was evacuated and trochars removed. An ostomy device was applied and the 5 mm sites closed with septic Monocryl and Dermabond. Sponge needle and counts were correct. The patient had had some bleeding earlier in the day from his buttock wound and then we carefully turned him up on the side and I did a dressing change and inspected the wound which still had some stool draining from the base, no active bleeding with a few little raw areas which I cauterized and the wound was repacked with clean saline gauze.    Findings: As above  Estimated Blood Loss:  Minimal         Drains: None  Blood Given: none          Specimens: None        Complications:  * No complications entered in OR log *         Disposition: PACU - hemodynamically stable.         Condition: stable

## 2016-06-12 ENCOUNTER — Encounter (HOSPITAL_COMMUNITY): Payer: Self-pay | Admitting: General Surgery

## 2016-06-12 ENCOUNTER — Telehealth: Payer: Self-pay | Admitting: Gastroenterology

## 2016-06-12 LAB — CBC
HCT: 33.8 % — ABNORMAL LOW (ref 39.0–52.0)
Hemoglobin: 11.1 g/dL — ABNORMAL LOW (ref 13.0–17.0)
MCH: 31.4 pg (ref 26.0–34.0)
MCHC: 32.8 g/dL (ref 30.0–36.0)
MCV: 95.5 fL (ref 78.0–100.0)
Platelets: 342 10*3/uL (ref 150–400)
RBC: 3.54 MIL/uL — ABNORMAL LOW (ref 4.22–5.81)
RDW: 14.6 % (ref 11.5–15.5)
WBC: 12.7 10*3/uL — ABNORMAL HIGH (ref 4.0–10.5)

## 2016-06-12 LAB — GLUCOSE, CAPILLARY
Glucose-Capillary: 110 mg/dL — ABNORMAL HIGH (ref 65–99)
Glucose-Capillary: 114 mg/dL — ABNORMAL HIGH (ref 65–99)
Glucose-Capillary: 115 mg/dL — ABNORMAL HIGH (ref 65–99)
Glucose-Capillary: 131 mg/dL — ABNORMAL HIGH (ref 65–99)
Glucose-Capillary: 131 mg/dL — ABNORMAL HIGH (ref 65–99)
Glucose-Capillary: 146 mg/dL — ABNORMAL HIGH (ref 65–99)

## 2016-06-12 LAB — HEPARIN LEVEL (UNFRACTIONATED)
Heparin Unfractionated: <0.1 IU/mL — ABNORMAL LOW (ref 0.30–0.70)
Heparin Unfractionated: 0.3 IU/mL (ref 0.30–0.70)

## 2016-06-12 MED ORDER — FAT EMULSION 20 % IV EMUL
240.0000 mL | INTRAVENOUS | Status: AC
  Administered 2016-06-12: 240 mL via INTRAVENOUS
  Filled 2016-06-12: qty 250

## 2016-06-12 MED ORDER — HEPARIN (PORCINE) IN NACL 100-0.45 UNIT/ML-% IJ SOLN
1700.0000 [IU]/h | INTRAMUSCULAR | Status: DC
  Administered 2016-06-12 – 2016-06-13 (×2): 1800 [IU]/h via INTRAVENOUS
  Administered 2016-06-13 – 2016-06-14 (×2): 1750 [IU]/h via INTRAVENOUS
  Filled 2016-06-12 (×4): qty 250

## 2016-06-12 MED ORDER — ZINC TRACE METAL 1 MG/ML IV SOLN
INTRAVENOUS | Status: AC
  Administered 2016-06-12: 18:00:00 via INTRAVENOUS
  Filled 2016-06-12: qty 2400

## 2016-06-12 NOTE — Progress Notes (Signed)
[place] for Heparin Indication: atrial fibrillation  Allergies  Allergen Reactions  . Darvocet [Propoxyphene N-Acetaminophen] Palpitations  . Propoxyphene Palpitations    Patient Measurements: Height: '5\' 9"'$  (175.3 cm) Weight: 182 lb 15.7 oz (83 kg) IBW/kg (Calculated) : 70.7 Heparin Dosing Weight: 81 kg  Vital Signs: Temp: 99.6 F (37.6 C) (09/15 1341) Temp Source: Oral (09/15 1341) BP: 115/66 (09/15 1341) Pulse Rate: 66 (09/15 1341)  Labs:  Recent Labs  06/10/16 0626 06/11/16 0500 06/12/16 0458 06/12/16 0459 06/12/16 2010  HGB 14.5 12.9*  --  11.1*  --   HCT 42.4 37.8*  --  33.8*  --   PLT 356 373  --  342  --   HEPARINUNFRC 0.47 0.30 <0.10*  --  0.30  CREATININE 1.11 1.11  --   --   --     Estimated Creatinine Clearance: 74.3 mL/min (by C-G formula based on SCr of 1.11 mg/dL).  Assessment: 55 year old male admitted 9/8 with a left gluteal abscess S/P multiple surgical I&D. Started on IV heparin on 9/10 for new onset Afib, held heparin yesterday for laproscopic colostomy. Per surgery instruction, will restart IV heparin with no bolus and keep level at low end therapeutic range.  Previously level was therapeutic on 1850 units/hr. Renal function stable. Hgb down 12.9 > 11.1 after surgery, pltc wnl.   PM heparin level therapeutic at 0.3  Goal of Therapy:  Heparin level 0.3-0.7 units/ml (try to keep at low-end) Monitor platelets by anticoagulation protocol: Yes   Plan:  Continue heparin at 1800 units / hr Daily heparin level and CBC F/u plans for long-term anticoagulation.  Thank you Anette Guarneri, PharmD [phone]  9:00 PM, 06/12/2016

## 2016-06-12 NOTE — Consult Note (Addendum)
[place] Nurse wound follow up Surgical team following for assessment and plan of care to buttock wound.  Pt had colostomy surgery yesterday.  Stoma visualized through pouch, which is intact with good seal.  Stoma red and viable, raised above skin level, small amt blood-tinged drainage, no stool or flatus at this time.  Supplies and educational materials left at bedside.  Will plan to perform pouch change on Monday. Julien Girt MSN, RN, [place], [place], [place]

## 2016-06-12 NOTE — Progress Notes (Signed)
Initial Nutrition Assessment  DOCUMENTATION CODES:   Not applicable  INTERVENTION:   -TPN management per pharmacy -RD will follow for diet advancement  NUTRITION DIAGNOSIS:   Increased nutrient needs related to wound healing as evidenced by estimated needs.  Ongoing  GOAL:   Patient will meet greater than or equal to 90% of their needs  Progressing  MONITOR:   Diet advancement, Labs, Weight trends, Skin, I & O's  REASON FOR ASSESSMENT:   Consult New TPN/TNA  ASSESSMENT:   55 year old male with a history of rectal cancer managed at [facility] by low anterior resection with diverting loop ileostomy/ s/p ileostomy reversal in 2014, who presented to [facility] ED this morning with left buttock pain. S/P I&D of left buttock for Fournier's gangrene.   S/p Procedure(s) on 06/11/16: LAPAROSCOPIC  COLOSTOMY  Per surgical service notes, pt with colonic fistula from buttocks abscess site.   Pt remains NPO, however, is anxious is eat.   Reviewed pharmacy notes. Pt currently receiving Clinimix E 5/15 to 80 ml/hr  lipids at 10 ml/hr.  TPN will meet 88% of minimal needs.  At 1800, plan to increase Clinimix E 5/15 to 100 ml/hr and continue lipids at 10 ml/hr.  TPN will meet 100% of minimal needs, proving 2184 kcals and 120 grams protein.  CWOCN note from 06/12/16 reviewed; pouch changed today.   Labs reviewed: CBGS: 110-131.   Diet Order:  .TPN (CLINIMIX-E) Adult TPN (CLINIMIX-E) Adult  Skin:  Wound (see comment) (Left buttock Fournier's gangrene S/P I&D)  Last BM:  06/10/16  Height:   Ht Readings from Last 1 Encounters:  06/08/16 '5\' 9"'$  (1.753 m)    Weight:   Wt Readings from Last 1 Encounters:  06/10/16 182 lb 15.7 oz (83 kg)    Ideal Body Weight:  72.7 kg  BMI:  Body mass index is 27.02 kg/m.  Estimated Nutritional Needs:   Kcal:  2100-2300  Protein:  110-125 grams  Fluid:  2.1-2.3 L  EDUCATION NEEDS:   No education needs identified at  this time  Jniyah Dantuono A. Jimmye Norman, RD, LDN, CDE Pager: [phone] After hours Pager: [phone]

## 2016-06-12 NOTE — Progress Notes (Signed)
1 Day Post-Op  Subjective: He looks pretty good for POD 1.  He is sore and rectal site is the most painful site.  We took down that dressing and it is fairly clean some fecal staining at the distal end of dressing.  There is some white colored drainage at the base of the wound.  Most of it looks fairly clean.  Ostomy is a bit swollen, allot of gas and starting to get some mucus like discharge from the ostomy.  Port sites all look good, Foley is in.   He want to know when he can eat.  Objective: Vital signs in last 24 hours: Temp:  [98.1 F (36.7 C)-99.6 F (37.6 C)] 98.4 F (36.9 C) (09/15 0721) Pulse Rate:  [65-94] 66 (09/15 0721) Resp:  [13-18] 17 (09/15 0721) BP: (109-124)/(48-71) 124/59 (09/15 0721) SpO2:  [93 %-100 %] 96 % (09/15 0721) Last BM Date: 06/10/16 120 PO 2400 IV fluids 20 stool  Afebrile, VSS WBC is stable   Intake/Output from previous day: 09/14 0701 - 09/15 0700 In: 2634.3 [P.O.:120; I.V.:2414.3; IV Piggyback:100] Out: 2790 [Urine:2760; Stool:20; Blood:10] Intake/Output this shift: No intake/output data recorded.  General appearance: alert, cooperative, no distress and still on O2. Resp: clear to auscultation bilaterally GI: soft sore, port sites fine.  ostomy a little swollen, some mucus and gas coming from it.  Skin: open buttocks, as noted above is fairly clean and starting BId dressing changes  Lab Results:   Recent Labs  06/11/16 0500 06/12/16 0459  WBC 13.1* 12.7*  HGB 12.9* 11.1*  HCT 37.8* 33.8*  PLT 373 342    BMET  Recent Labs  06/10/16 0626 06/11/16 0500  NA 137 139  K 4.1 3.6  CL 105 106  CO2 23 26  GLUCOSE 107* 120*  BUN 7 10  CREATININE 1.11 1.11  CALCIUM 8.7* 8.3*   PT/INR No results for input(s): LABPROT, INR in the last 72 hours.   Recent Labs Lab 06/06/16 0443 06/10/16 0626 06/11/16 0500  AST 35 39 33  ALT 25 39 37  ALKPHOS 60 55 51  BILITOT 0.8 1.2 1.2  PROT 4.9* 6.3* 5.7*  ALBUMIN 1.8* 2.3* 2.1*      Lipase  No results found for: LIPASE   Studies/Results: Dg Chest Port 1 View  Result Date: 06/10/2016 CLINICAL DATA:  Evaluate central line placement, shortness of breath EXAM: PORTABLE CHEST 1 VIEW COMPARISON:  Portable chest x-ray of 06/06/2016 FINDINGS: A right PICC line is now present with the tip seen to the lower SVC above the expected right atrial junction. No pneumothorax is noted. Mild pulmonary vascular congestion cannot be excluded. However, no focal infiltrate or effusion is seen. The heart is within normal limits in size. IMPRESSION: 1. Right PICC line tip is seen to the lower SVC.  No pneumothorax. 2. Question mild pulmonary vascular congestion. Electronically Signed   By: Ivar Drape M.D.   On: 06/10/2016 10:45    Medications: . insulin aspart  0-9 Units Subcutaneous Q6H  . isosorbide mononitrate  15 mg Oral Daily  . metoprolol tartrate  25 mg Oral BID  . piperacillin-tazobactam (ZOSYN)  IV  3.375 g Intravenous Q8H  . sodium hypochlorite   Irrigation BID   . Marland KitchenTPN (CLINIMIX-E) Adult 80 mL/hr at 06/11/16 1956   And  . fat emulsion 240 mL (06/11/16 1956)  . heparin      Septic shock with necrotizing fasciitis - admitted to [facility] 06/05/16 I&D left Buttocks 06/05/16 Dr. Elta Guadeloupe  [facility] Transfer to [facility] 06/05/16- CONSULT Dr. Donnie Mesa DEBRIDEMENT of skin, subcutaneous tissue, and muscle of left buttock, pulsatile lavage, 06/07/16 Dr. Greer Pickerel Finding of a colonic fistula from the buttocks abscess site Hx of colon and rectal adenocarcinoma with oral chemotherapy, LAR with diverting ileostomy 2012 at [place]  Assessment/Plan Buttocks with fistula to the rectum S/p laparoscopic colostomy, 06/12/16, Dr. [name] POD 1 Atrial fibrillation Heparin drip. Malnutrition - TNA FEN: NPO/TNA -  Add ice chips ID: Zosyn day 8 she has competed 3 days of Clindamycin and Vancomycin on 06/07/16 DVT: Heparin drip for AF    Plan:  D/c foley, OOB, continue  antibiotics and TNA.    LOS: 7 days    Deeandra Jerry 06/12/2016 [phone]

## 2016-06-12 NOTE — Progress Notes (Signed)
PARENTERAL NUTRITION CONSULT NOTE - FOLLOW UP  Pharmacy Consult:  TPN Indication:  Fistula from rectal anastomosis to buttock  Allergies  Allergen Reactions  . Darvocet [Propoxyphene N-Acetaminophen] Palpitations  . Propoxyphene Palpitations    Patient Measurements: Height: '5\' 9"'$  (175.3 cm) Weight: 182 lb 15.7 oz (83 kg) IBW/kg (Calculated) : 70.7  Vital Signs: Temp: 98.4 F (36.9 C) (09/15 0721) Temp Source: Oral (09/15 0721) BP: 124/59 (09/15 0721) Pulse Rate: 66 (09/15 0721) Intake/Output from previous day: 09/14 0701 - 09/15 0700 In: 2634.3 [P.O.:120; I.V.:2414.3; IV Piggyback:100] Out: 2790 [Urine:2760; Stool:20; Blood:10] Intake/Output from this shift: No intake/output data recorded.  Labs:  Recent Labs  06/10/16 0626 06/11/16 0500 06/12/16 0459  WBC 12.4* 13.1* 12.7*  HGB 14.5 12.9* 11.1*  HCT 42.4 37.8* 33.8*  PLT 356 373 342     Recent Labs  06/09/16 0928 06/10/16 0626 06/11/16 0500  NA 139 137 139  K 4.2 4.1 3.6  CL 106 105 106  CO2 '25 23 26  '$ GLUCOSE 120* 107* 120*  BUN '7 7 10  '$ CREATININE 1.02 1.11 1.11  CALCIUM 8.6* 8.7* 8.3*  MG 1.9 2.0 2.1  PHOS 2.9 3.3 3.4  PROT  --  6.3* 5.7*  ALBUMIN  --  2.3* 2.1*  AST  --  39 33  ALT  --  39 37  ALKPHOS  --  55 51  BILITOT  --  1.2 1.2  PREALBUMIN  --  8.8*  --   TRIG  --  144  --    Estimated Creatinine Clearance: 74.3 mL/min (by C-G formula based on SCr of 1.11 mg/dL).    Recent Labs  06/12/16 0018 06/12/16 0209 06/12/16 0655  GLUCAP 146* 131* 115*     Insulin Requirements in the past 24 hours:  2 unit of sensitive SSI q6h  Assessment: 79 YOM with history of colon and rectal cancer post chemo and ileostomy with closure in 2012.  Patient presented to [facility] on 06/05/16 with left buttock pain and transferred to [facility] for further management.  Now s/p multiple I&D's with no further I&D's planned.  On 06/09/16, found to have fistula to the buttocks and Pharmacy consulted to initiate TPN for  nutritional support.  Noted patient reported no recent weight loss and was eating less during the hospitalization to avoid having diarrhea.  GI: GERD - Pepcid in TPN.  Baseline prealbumin low at 8.8.  BM x2.  s/p transverse colostomy on 9/14. Starting ice chips 9/15. Patient wants to eat.  Endo: TSH WNL.  No hx DM - CBGs controlled Lytes: all WNL Renal: SCr 1.11, CrCL 74 ml/min, BUN WNL - good UOP 1.4 ml/kg/hr Pulm: nodules / tobacco - back up to 3L [place] post-operatively Cards: CAD / HTN / PAD - VSS - Imdur, Lopressor Hepatobil: LFTs / tbili / TG WNL AC: Heparin for new-onset Afib - resumed 9/15 post-op, CBC stable, no bleeding Neuro: chronic back pain - PRN oxycodone/morphine ID: on Zosyn, s/p Vanc & Clinda, for gluteal abscess, mult I&Ds - Tm 99.6, WBC down 12.7. Best Practices: heparin gtt   TPN Access: PICC placed 06/10/16 TPN start date: 06/10/16  Current Nutrition:   TPN  Nutritional Goals:  2100-2300 kCal, 110-125 grams of protein per day   Plan:  - Increase Clinimix E 5/15 to 100 ml/hr (at goal of 100 ml/hr) and continue lipids at 10 ml/hr.  TPN will meet 100% of minimal needs. - Daily multivitamin and trace elements in TPN - Pepcid '40mg'$  in  TPN - Add Vitamin C '500mg'$  and Zinc '5mg'$  to help with wound healing - Continue sensitive SSI Q6H.  D/C if CBGs remain controlled at goal TPN rate. - F/U toleration of ice chips and ability to enteral feed  Sloan Leiter, PharmD, BCPS Clinical Pharmacist [phone] 06/12/2016, 8:13 AM

## 2016-06-12 NOTE — Progress Notes (Signed)
[facility] PROGRESS NOTE  Juan Wheeler TGG:[id number] DOB: 1960/11/05 DOA: 06/05/2016 PCP: [name], MD    Interim summary and HPI 55 year old male admitted from [facility] 9/8 with a left buttock abscess concerning for necrotizing fasciitis status post surgical I&D. Transferred to [facility] 9/8 for Severe sepsis. He clinically improved with Abx and IVF. Levophed was weaned off.  I and D done on 9/10. Had intermittent afib and aflutter while in ICU.  Now stable and extubated.  Transfer to telemetry bed and to [facility] hospital on 9/12 Needed further surgery 06/11/16 for fistula  Assessment/Plan: 1-Sepsis due to left buttock abscess/cellulitis:  with current complications of Fistula  Operative upon 06/11/16 by Dr. [name] s/p diverting colectomy 9/14 -general surgery on board, for -continue IV zosyn, duration to be guided by Gen surgery -patient afebrile and with WBC's trending down appropriately -diet graduation per gen surgery  2-new diagnosis of PAF: patient A. Fib/A. Flutter on telemetry -rate controlled currently -continue metoprolol  -heparin post-procedure as per gen surgery -patient with normal TSH and normal 2-D echo -CHADsVASC score 3 -will need outpatient follow up with cardiology service; conversion to oral anticoagulation once all procedures needed have been completed  3-essential HTN -continue imdur and metoprolol  4-AKI: in setting of sepsis  -Cr back to normal range now -maintain adequate hydration and perfusion  5-hx of adenocarcinoma of the rectum: -s/p resection [facility] and currently in remission -will continue outpatient follow up with Hem/Onc   6-chronic pain syndrome -continue outpatient pain medication regimen   7-GERD -will continue famotidine   imparied glucose tolerance Monitoring glucise levels, not on PTA meds, likely 2/2 to TPN Get A1c in am conider metformin on d/c if considered diabetic base don labs  Code Status: Full Family  Communication: briefly d/w family at bedside earlier today Disposition Plan: to be determine-as per Gen surgery input  Consultants:  PCCM  General surgery   Procedures and significant events : 9/8 CT Pelvis >> CT pelvis obtained which demonstrated changes consistent with significant cellulitis in the left buttock with diffuse irregular air collection medially within the buttock extending into the gluteal muscles and posterior upper left thigh/pelvic cavity. No definitive fluid component identified at that time. 9/08  Admit with left buttock abscess, concern for necrotizing fasciitis.I.    imparied glucose t AP by Dr. Arnoldo Morale 9/10  Had further debridement and cleaning by CCS at [facility] 9/8>>9/10: intubated 9/14 Diverting colostomy perfomred by gen surg  Antibiotics: Zosyn 9/8 >>  Vanco 9/8 >> 9/11 Clinda 9/8 >> 9/11  HPI/Subjective:     Objective: Vitals:   06/12/16 0721 06/12/16 1105  BP: (!) 124/59 133/65  Pulse: 66 67  Resp: 17   Temp: 98.4 F (36.9 C)     Intake/Output Summary (Last 24 hours) at 06/12/16 1325 Last data filed at 06/12/16 1303  Gross per 24 hour  Intake          2457.67 ml  Output             2840 ml  Net          -382.33 ml   Filed Weights   06/07/16 0434 06/08/16 0400 06/10/16 0543  Weight: 90.3 kg (199 lb 1.2 oz) 88.6 kg (195 lb 5.2 oz) 83 kg (182 lb 15.7 oz)    Exam:   General:  Afebrile, denies CP, SOB and palpitations.   Cardiovascular: no rubs, no gallops, irregular, no murmurs appreciated  Respiratory: CTA bilaterally  Abdomen: soft, NT, positive BS  Musculoskeletal: no Le edema, no cyanosis   Skin: left buttock area with large open wound, Bleeding ++.  Seems to have dried  Data Reviewed: Basic Metabolic Panel:  Recent Labs Lab 06/06/16 0014 06/06/16 0443 06/06/16 2309 06/09/16 0928 06/10/16 0626 06/11/16 0500  NA 135 136 133* 139 137 139  K 4.9 4.8 4.1 4.2 4.1 3.6  CL 106 107 104 106 105 106  CO2 '23 23 22 25 23  26  '$ GLUCOSE 142* 133* 93 120* 107* 120*  BUN '11 10 9 7 7 10  '$ CREATININE 1.36* 1.33* 1.20 1.02 1.11 1.11  CALCIUM 7.5* 7.5* 7.6* 8.6* 8.7* 8.3*  MG 1.9  --  1.9 1.9 2.0 2.1  PHOS  --   --   --  2.9 3.3 3.4   Liver Function Tests:  Recent Labs Lab 06/06/16 0443 06/10/16 0626 06/11/16 0500  AST 35 39 33  ALT 25 39 37  ALKPHOS 60 55 51  BILITOT 0.8 1.2 1.2  PROT 4.9* 6.3* 5.7*  ALBUMIN 1.8* 2.3* 2.1*   CBC:  Recent Labs Lab 06/06/16 0443 06/08/16 0448 06/09/16 0928 06/10/16 0626 06/11/16 0500 06/12/16 0459  WBC 18.0* 14.9* 15.1* 12.4* 13.1* 12.7*  NEUTROABS 15.0*  --   --  9.3*  --   --   HGB 12.5* 12.3* 14.0 14.5 12.9* 11.1*  HCT 37.3* 35.7* 41.0 42.4 37.8* 33.8*  MCV 95.2 93.7 93.6 94.2 93.8 95.5  PLT 177 240 332 356 373 342   Cardiac Enzymes:  Recent Labs Lab 06/06/16 0014 06/06/16 0930  TROPONINI 0.58* 0.04*   CBG:  Recent Labs Lab 06/11/16 1147 06/12/16 0018 06/12/16 0209 06/12/16 0655 06/12/16 1118  GLUCAP 124* 146* 131* 115* 110*    Recent Results (from the past 240 hour(s))  Blood culture (routine x 2)     Status: None   Collection Time: 06/05/16  6:45 AM  Result Value Ref Range Status   Specimen Description BLOOD RIGHT FOREARM DRAWN BY RN YW  Final   Special Requests BOTTLES DRAWN AEROBIC ONLY 5CC  Final   Culture NO GROWTH 5 DAYS  Final   Report Status 06/10/2016 FINAL  Final  Blood culture (routine x 2)     Status: None   Collection Time: 06/05/16  7:40 AM  Result Value Ref Range Status   Specimen Description BLOOD LEFT HAND DRAWN BY RN RH  Final   Special Requests BOTTLES DRAWN AEROBIC AND ANAEROBIC 5CC EACH  Final   Culture NO GROWTH 5 DAYS  Final   Report Status 06/10/2016 FINAL  Final  Aerobic/Anaerobic Culture (surgical/deep wound)     Status: None   Collection Time: 06/05/16 10:36 AM  Result Value Ref Range Status   Specimen Description BUTTOCKS LEFT  Final   Special Requests NONE  Final   Gram Stain   Final    RARE WBC  PRESENT,BOTH PMN AND MONONUCLEAR NO ORGANISMS SEEN    Culture   Final    RARE PEPTOSTREPTOCOCCUS SPECIES FEW BACTEROIDES VULGATUS BETA LACTAMASE NEGATIVE Performed at [facility]    Report Status 06/09/2016 FINAL  Final  Culture, respiratory (NON-Expectorated)     Status: None   Collection Time: 06/05/16  5:08 PM  Result Value Ref Range Status   Specimen Description TRACHEAL ASPIRATE  Final   Special Requests NONE  Final   Gram Stain   Final    MODERATE WBC PRESENT,BOTH PMN AND MONONUCLEAR NO ORGANISMS SEEN    Culture Consistent with normal respiratory flora.  Final   Report Status 06/08/2016 FINAL  Final  MRSA PCR Screening     Status: None   Collection Time: 06/09/16 11:31 AM  Result Value Ref Range Status   MRSA by PCR NEGATIVE NEGATIVE Final    Comment:        The GeneXpert MRSA Assay (FDA approved for NASAL specimens only), is one component of a comprehensive MRSA colonization surveillance program. It is not intended to diagnose MRSA infection nor to guide or monitor treatment for MRSA infections.   Surgical pcr screen     Status: None   Collection Time: 06/11/16  5:11 AM  Result Value Ref Range Status   MRSA, PCR NEGATIVE NEGATIVE Final   Staphylococcus aureus NEGATIVE NEGATIVE Final    Comment:        The Xpert SA Assay (FDA approved for NASAL specimens in patients over 20 years of age), is one component of a comprehensive surveillance program.  Test performance has been validated by [facility] for patients greater than or equal to 67 year old. It is not intended to diagnose infection nor to guide or monitor treatment.      Studies: No results found.  Scheduled Meds: . insulin aspart  0-9 Units Subcutaneous Q6H  . isosorbide mononitrate  15 mg Oral Daily  . metoprolol tartrate  25 mg Oral BID  . piperacillin-tazobactam (ZOSYN)  IV  3.375 g Intravenous Q8H   Continuous Infusions: . Marland KitchenTPN (CLINIMIX-E) Adult 80 mL/hr at 06/11/16 1956    And  . fat emulsion 240 mL (06/11/16 1956)  . Marland KitchenTPN (CLINIMIX-E) Adult     And  . fat emulsion    . heparin 1,800 Units/hr (06/12/16 1113)    Active Problems:   Fournier's gangrene in male   Abscess   Sepsis ([place])   Necrotizing fasciitis ([place])   Central venous catheter in place   Acute respiratory failure ([place])   AKI (acute kidney injury) ([place])   Time spent: 20 minutes  Verneita Griffes, MD [facility] [phone]

## 2016-06-12 NOTE — Progress Notes (Signed)
[place] for Heparin Indication: atrial fibrillation  Allergies  Allergen Reactions  . Darvocet [Propoxyphene N-Acetaminophen] Palpitations  . Propoxyphene Palpitations    Patient Measurements: Height: '5\' 9"'$  (175.3 cm) Weight: 182 lb 15.7 oz (83 kg) IBW/kg (Calculated) : 70.7 Heparin Dosing Weight: 81 kg  Vital Signs: Temp: 98.4 F (36.9 C) (09/15 0721) Temp Source: Oral (09/15 0721) BP: 124/59 (09/15 0721) Pulse Rate: 66 (09/15 0721)  Labs:  Recent Labs  06/10/16 0626 06/11/16 0500 06/12/16 0458 06/12/16 0459  HGB 14.5 12.9*  --  11.1*  HCT 42.4 37.8*  --  33.8*  PLT 356 373  --  342  HEPARINUNFRC 0.47 0.30 <0.10*  --   CREATININE 1.11 1.11  --   --     Estimated Creatinine Clearance: 74.3 mL/min (by C-G formula based on SCr of 1.11 mg/dL).  Assessment: 55 year old male admitted 9/8 with a left gluteal abscess S/P multiple surgical I&D. Started on IV heparin on 9/10 for new onset Afib, held heparin yesterday for laproscopic colostomy. Per surgery instruction, will restart IV heparin with no bolus and keep level at low end therapeutic range.  Previously level was therapeutic on 1850 units/hr. Renal function stable. Hgb down 12.9 > 11.1 after surgery, pltc wnl.   Goal of Therapy:  Heparin level 0.3-0.7 units/ml (try to keep at low-end) Monitor platelets by anticoagulation protocol: Yes   Plan:  Restart IV heparin 1800 units/hr with no bolus F/u heparin level at 1700 Daily heparin level and CBC F/u plans for long-term anticoagulation.  Maryanna Shape, PharmD, BCPS  Clinical Pharmacist  Pager: [phone]   10:06 AM, 06/12/2016

## 2016-06-12 NOTE — Telephone Encounter (Signed)
We received referral for patient for history of rectal cancer, having rectal pain and change in bowel habits. Since the referral was initiated, patient presented with septic shock, necrotizing fasciitis of the left buttock. He required emergent incision and drainage at [facility] but was ultimately transferred to [facility]. He underwent a second debridement but ultimately required diverting colostomy yesterday.  Given recent events, would hold off on appointment with [place] until patient adequately heals. He has history of rectal cancer previously managed by Dr. Lenise Arena at [place] surveillance colonoscopy by [name] was in September 2013 with unremarkable colorectal anastomosis biopsy. It was recommended he have follow-up colonoscopy in 2016.  I WOULD RECOMMEND PATIENT FOLLOW UP WITH DR. WATERS GIVEN COMPLEXITY OF HIS CASE BUT IF HE PREFERS TO/NEEDS TO STAY LOCAL WE WOULD BE GLAD TO SEE PATIENT FOR FOLLOW UP REGARDING SURVEILLANCE COLONOSCOPY. WOULD OFFER PATIENT F/U IN 3 MONTHS.

## 2016-06-13 LAB — COMPREHENSIVE METABOLIC PANEL
ALT: 30 U/L (ref 17–63)
AST: 24 U/L (ref 15–41)
Albumin: 2 g/dL — ABNORMAL LOW (ref 3.5–5.0)
Alkaline Phosphatase: 46 U/L (ref 38–126)
Anion gap: 5 (ref 5–15)
BUN: 13 mg/dL (ref 6–20)
CO2: 26 mmol/L (ref 22–32)
Calcium: 8.2 mg/dL — ABNORMAL LOW (ref 8.9–10.3)
Chloride: 109 mmol/L (ref 101–111)
Creatinine, Ser: 0.99 mg/dL (ref 0.61–1.24)
GFR calc Af Amer: >60 mL/min (ref >60)
GFR calc non Af Amer: >60 mL/min (ref >60)
Glucose, Bld: 125 mg/dL — ABNORMAL HIGH (ref 65–99)
Potassium: 4.1 mmol/L (ref 3.5–5.1)
Sodium: 140 mmol/L (ref 135–145)
Total Bilirubin: 0.4 mg/dL (ref 0.3–1.2)
Total Protein: 5.6 g/dL — ABNORMAL LOW (ref 6.5–8.1)

## 2016-06-13 LAB — CBC WITH DIFFERENTIAL/PLATELET
Basophils Absolute: 0 10*3/uL (ref 0.0–0.1)
Basophils Relative: 0 %
Eosinophils Absolute: 0.4 10*3/uL (ref 0.0–0.7)
Eosinophils Relative: 3 %
HCT: 32.5 % — ABNORMAL LOW (ref 39.0–52.0)
Hemoglobin: 10.7 g/dL — ABNORMAL LOW (ref 13.0–17.0)
Lymphocytes Relative: 13 %
Lymphs Abs: 1.8 10*3/uL (ref 0.7–4.0)
MCH: 31.5 pg (ref 26.0–34.0)
MCHC: 32.9 g/dL (ref 30.0–36.0)
MCV: 95.6 fL (ref 78.0–100.0)
Monocytes Absolute: 0.8 10*3/uL (ref 0.1–1.0)
Monocytes Relative: 6 %
Neutro Abs: 10.3 10*3/uL — ABNORMAL HIGH (ref 1.7–7.7)
Neutrophils Relative %: 78 %
Platelets: 378 10*3/uL (ref 150–400)
RBC: 3.4 MIL/uL — ABNORMAL LOW (ref 4.22–5.81)
RDW: 14.9 % (ref 11.5–15.5)
WBC: 13.3 10*3/uL — ABNORMAL HIGH (ref 4.0–10.5)

## 2016-06-13 LAB — GLUCOSE, CAPILLARY
Glucose-Capillary: 135 mg/dL — ABNORMAL HIGH (ref 65–99)
Glucose-Capillary: 130 mg/dL — ABNORMAL HIGH (ref 65–99)

## 2016-06-13 LAB — HEPARIN LEVEL (UNFRACTIONATED): Heparin Unfractionated: 0.52 IU/mL (ref 0.30–0.70)

## 2016-06-13 MED ORDER — MORPHINE SULFATE (PF) 2 MG/ML IV SOLN
2.0000 mg | INTRAVENOUS | Status: DC | PRN
Start: 2016-06-13 — End: 2016-06-16
  Administered 2016-06-13 – 2016-06-15 (×7): 2 mg via INTRAVENOUS
  Administered 2016-06-15 – 2016-06-16 (×4): 4 mg via INTRAVENOUS
  Filled 2016-06-13: qty 1
  Filled 2016-06-13 (×2): qty 2
  Filled 2016-06-13 (×2): qty 1
  Filled 2016-06-13: qty 2
  Filled 2016-06-13: qty 1
  Filled 2016-06-13: qty 2
  Filled 2016-06-13 (×4): qty 1

## 2016-06-13 MED ORDER — ASCORBIC ACID 500 MG/ML IJ SOLN
INTRAMUSCULAR | Status: AC
  Administered 2016-06-13: 18:00:00 via INTRAVENOUS
  Filled 2016-06-13: qty 2400

## 2016-06-13 MED ORDER — FAT EMULSION 20 % IV EMUL
240.0000 mL | INTRAVENOUS | Status: AC
  Administered 2016-06-13: 240 mL via INTRAVENOUS
  Filled 2016-06-13: qty 250

## 2016-06-13 NOTE — Progress Notes (Signed)
[facility] PROGRESS NOTE  Juan Wheeler EGB:[id number] DOB: 1961-06-12 DOA: 06/05/2016 PCP: [name], MD    Interim summary and HPI 55 year old male admitted from [facility] 9/8 with a left buttock abscess status post surgical I&D. Transferred to [facility] 9/8 for Severe sepsis. He clinically improved with Abx and IVF. Levophed was weaned off.  I and D done on 9/10. Had intermittent afib and aflutter while in ICU.  Now stable and extubated.  Transfer to telemetry bed and to [facility] hospital on 9/12 Needed further surgery 06/11/16 for fistula  Assessment/Plan: 1-Sepsis due to left buttock abscess with fistula to rectum -Initial surgery at [facility] on 9/8 with I&D of L buttock -then repeat OR 9/10 per Dr.Wilson :DEBRIDEMENT of skin, subcutaneous tissue, and muscle of left buttock, pulsatile lavage -Next 9/14 per Dr.Hoxworth s/p lap diverting colectomy -per CCS, diet being advanced, ? Wean TNA -remains on IV Zosyn-? Duration per CCS, blood Cx negative and deep wound cx didn't grow any particular organism  2-new diagnosis of PAF: patient A. Fib/A. Flutter on telemetry -rate controlled currently -continue metoprolol  -on IV heparin now, convert to oral DOAC after d/w CCS -patient with normal TSH and normal 2-D echo -CHADsVASC score 3  3-essential HTN -continue imdur and metoprolol  4-AKI: in setting of sepsis  -Cr back to normal range now -on TNA   5-hx of adenocarcinoma of the rectum: -s/p resection [facility] and currently in remission -follow up with Hem/Onc   6-chronic pain syndrome -continue outpatient pain medication regimen   7-GERD -will continue famotidine   8. imparied glucose tolerance -Fu Hbaic  Code Status: Full Family Communication: no family at bedside today Disposition Plan: per CCS  Consultants:  PCCM  General surgery   Procedures and significant events : 9/8 CT Pelvis >> CT pelvis obtained which demonstrated changes consistent with significant  cellulitis in the left buttock with diffuse irregular air collection medially within the buttock extending into the gluteal muscles and posterior upper left thigh/pelvic cavity. No definitive fluid component identified at that time. 9/08  Admit with left buttock abscess, fistula   imparied glucose t AP by Dr. Arnoldo Morale 9/10  Had further debridement and cleaning by CCS at [facility] 9/8>>9/10: intubated 9/14 Diverting colostomy perfomred by gen surg  Antibiotics: Zosyn 9/8 >>  Vanco 9/8 >> 9/11 Clinda 9/8 >> 9/11  HPI/Subjective:  Feels ok, tolerating liquids   Objective: Vitals:   06/13/16 0522 06/13/16 1228  BP: 135/71 125/77  Pulse: (!) 58 60  Resp: 16 19  Temp: 98.5 F (36.9 C) 98.3 F (36.8 C)    Intake/Output Summary (Last 24 hours) at 06/13/16 1327 Last data filed at 06/13/16 1212  Gross per 24 hour  Intake           1312.9 ml  Output             4180 ml  Net          -2867.1 ml   Filed Weights   06/07/16 0434 06/08/16 0400 06/10/16 0543  Weight: 90.3 kg (199 lb 1.2 oz) 88.6 kg (195 lb 5.2 oz) 83 kg (182 lb 15.7 oz)    Exam:   General:  AAOx3, no distress  Cardiovascular: no rubs, no gallops, irregular, no murmurs appreciated  Respiratory: CTA bilaterally  Abdomen: soft, NT, positive BS, ostomy noted  Musculoskeletal: no Le edema, no cyanosis   Skin: left buttock area with large open wound,   Data Reviewed: Basic Metabolic Panel:  Recent Labs Lab 06/06/16  2309 06/09/16 0928 06/10/16 0626 06/11/16 0500 06/13/16 0540  NA 133* 139 137 139 140  K 4.1 4.2 4.1 3.6 4.1  CL 104 106 105 106 109  CO2 '22 25 23 26 26  '$ GLUCOSE 93 120* 107* 120* 125*  BUN '9 7 7 10 13  '$ CREATININE 1.20 1.02 1.11 1.11 0.99  CALCIUM 7.6* 8.6* 8.7* 8.3* 8.2*  MG 1.9 1.9 2.0 2.1  --   PHOS  --  2.9 3.3 3.4  --    Liver Function Tests:  Recent Labs Lab 06/10/16 0626 06/11/16 0500 06/13/16 0540  AST 39 33 24  ALT 39 37 30  ALKPHOS 55 51 46  BILITOT 1.2 1.2 0.4  PROT  6.3* 5.7* 5.6*  ALBUMIN 2.3* 2.1* 2.0*   CBC:  Recent Labs Lab 06/09/16 0928 06/10/16 0626 06/11/16 0500 06/12/16 0459 06/13/16 0540  WBC 15.1* 12.4* 13.1* 12.7* 13.3*  NEUTROABS  --  9.3*  --   --  10.3*  HGB 14.0 14.5 12.9* 11.1* 10.7*  HCT 41.0 42.4 37.8* 33.8* 32.5*  MCV 93.6 94.2 93.8 95.5 95.6  PLT 332 356 373 342 378   Cardiac Enzymes: No results for input(s): CKTOTAL, CKMB, CKMBINDEX, TROPONINI in the last 168 hours. CBG:  Recent Labs Lab 06/12/16 0655 06/12/16 1118 06/12/16 1827 06/12/16 2344 06/13/16 0523  GLUCAP 115* 110* 131* 114* 135*    Recent Results (from the past 240 hour(s))  Blood culture (routine x 2)     Status: None   Collection Time: 06/05/16  6:45 AM  Result Value Ref Range Status   Specimen Description BLOOD RIGHT FOREARM DRAWN BY RN YW  Final   Special Requests BOTTLES DRAWN AEROBIC ONLY 5CC  Final   Culture NO GROWTH 5 DAYS  Final   Report Status 06/10/2016 FINAL  Final  Blood culture (routine x 2)     Status: None   Collection Time: 06/05/16  7:40 AM  Result Value Ref Range Status   Specimen Description BLOOD LEFT HAND DRAWN BY RN RH  Final   Special Requests BOTTLES DRAWN AEROBIC AND ANAEROBIC 5CC EACH  Final   Culture NO GROWTH 5 DAYS  Final   Report Status 06/10/2016 FINAL  Final  Aerobic/Anaerobic Culture (surgical/deep wound)     Status: None   Collection Time: 06/05/16 10:36 AM  Result Value Ref Range Status   Specimen Description BUTTOCKS LEFT  Final   Special Requests NONE  Final   Gram Stain   Final    RARE WBC PRESENT,BOTH PMN AND MONONUCLEAR NO ORGANISMS SEEN    Culture   Final    RARE PEPTOSTREPTOCOCCUS SPECIES FEW BACTEROIDES VULGATUS BETA LACTAMASE NEGATIVE Performed at [facility]    Report Status 06/09/2016 FINAL  Final  Culture, respiratory (NON-Expectorated)     Status: None   Collection Time: 06/05/16  5:08 PM  Result Value Ref Range Status   Specimen Description TRACHEAL ASPIRATE  Final    Special Requests NONE  Final   Gram Stain   Final    MODERATE WBC PRESENT,BOTH PMN AND MONONUCLEAR NO ORGANISMS SEEN    Culture Consistent with normal respiratory flora.  Final   Report Status 06/08/2016 FINAL  Final  MRSA PCR Screening     Status: None   Collection Time: 06/09/16 11:31 AM  Result Value Ref Range Status   MRSA by PCR NEGATIVE NEGATIVE Final    Comment:        The GeneXpert MRSA Assay (FDA approved for NASAL  specimens only), is one component of a comprehensive MRSA colonization surveillance program. It is not intended to diagnose MRSA infection nor to guide or monitor treatment for MRSA infections.   Surgical pcr screen     Status: None   Collection Time: 06/11/16  5:11 AM  Result Value Ref Range Status   MRSA, PCR NEGATIVE NEGATIVE Final   Staphylococcus aureus NEGATIVE NEGATIVE Final    Comment:        The Xpert SA Assay (FDA approved for NASAL specimens in patients over 13 years of age), is one component of a comprehensive surveillance program.  Test performance has been validated by [facility] for patients greater than or equal to 3 year old. It is not intended to diagnose infection nor to guide or monitor treatment.      Studies: No results found.  Scheduled Meds: . insulin aspart  0-9 Units Subcutaneous Q6H  . isosorbide mononitrate  15 mg Oral Daily  . metoprolol tartrate  25 mg Oral BID  . piperacillin-tazobactam (ZOSYN)  IV  3.375 g Intravenous Q8H   Continuous Infusions: . Marland KitchenTPN (CLINIMIX-E) Adult 100 mL/hr at 06/12/16 1808   And  . fat emulsion 240 mL (06/12/16 1808)  . Marland KitchenTPN (CLINIMIX-E) Adult     And  . fat emulsion    . heparin 1,750 Units/hr (06/13/16 1155)    Active Problems:   Fournier's gangrene in male   Abscess   Sepsis ([place])   Necrotizing fasciitis ([place])   Central venous catheter in place   Acute respiratory failure ([place])   AKI (acute kidney injury) ([place])   Time spent: 20 minutes  Domenic Polite, MD [facility] (P) [phone]

## 2016-06-13 NOTE — Progress Notes (Signed)
PARENTERAL NUTRITION CONSULT NOTE - FOLLOW UP  Pharmacy Consult:  TPN Indication:  Fistula from rectal anastomosis to buttock  Allergies  Allergen Reactions  . Darvocet [Propoxyphene N-Acetaminophen] Palpitations  . Propoxyphene Palpitations    Patient Measurements: Height: '5\' 9"'$  (175.3 cm) Weight: 182 lb 15.7 oz (83 kg) IBW/kg (Calculated) : 70.7  Vital Signs: Temp: 98.5 F (36.9 C) (09/16 0522) Temp Source: Oral (09/16 0522) BP: 135/71 (09/16 0522) Pulse Rate: 58 (09/16 0522) Intake/Output from previous day: 09/15 0701 - 09/16 0700 In: 1312.9 [I.V.:1212.9; IV Piggyback:100] Out: 2119 [Urine:3280] Intake/Output from this shift: No intake/output data recorded.  Labs:  Recent Labs  06/11/16 0500 06/12/16 0459 06/13/16 0540  WBC 13.1* 12.7* 13.3*  HGB 12.9* 11.1* 10.7*  HCT 37.8* 33.8* 32.5*  PLT 373 342 378     Recent Labs  06/11/16 0500 06/13/16 0540  NA 139 140  K 3.6 4.1  CL 106 109  CO2 26 26  GLUCOSE 120* 125*  BUN 10 13  CREATININE 1.11 0.99  CALCIUM 8.3* 8.2*  MG 2.1  --   PHOS 3.4  --   PROT 5.7* 5.6*  ALBUMIN 2.1* 2.0*  AST 33 24  ALT 37 30  ALKPHOS 51 46  BILITOT 1.2 0.4   Estimated Creatinine Clearance: 83.3 mL/min (by C-G formula based on SCr of 0.99 mg/dL).    Recent Labs  06/12/16 1827 06/12/16 2344 06/13/16 0523  GLUCAP 131* 114* 135*     Insulin Requirements in the past 24 hours:  2 unit of sensitive SSI q6h  Assessment: 71 YOM with history of colon and rectal cancer post chemo and ileostomy with closure in 2012.  Patient presented to [facility] on 06/05/16 with left buttock pain and transferred to [facility] for further management.  Now s/p multiple I&D's with no further I&D's planned.  On 06/09/16, found to have fistula to the buttocks and Pharmacy consulted to initiate TPN for nutritional support.  Noted patient reported no recent weight loss and was eating less during the hospitalization to avoid having diarrhea.  GI: GERD - Pepcid  in TPN.  Baseline prealbumin low at 8.8.  BM x2.  s/p transverse colostomy on 9/14. Starting ice chips 9/15. Patient wants to eat.  Endo: TSH WNL.  No hx DM - CBGs controlled Lytes: all WNL Renal: SCr 1.11, CrCL 74 ml/min, BUN WNL - good UOP 1.4 ml/kg/hr Pulm: nodules / tobacco - back up to 3L [place] post-operatively Cards: CAD / HTN / PAD - VSS - Imdur, Lopressor Hepatobil: LFTs / tbili / TG WNL AC: Heparin for new-onset Afib - resumed 9/15 post-op, CBC stable, no bleeding Neuro: chronic back pain - PRN oxycodone/morphine ID: on Zosyn, s/p Vanc & Clinda, for gluteal abscess, mult I&Ds - Tm 99.6, WBC 13.3.  Best Practices: heparin gtt   TPN Access: PICC placed 06/10/16 TPN start date: 06/10/16  Current Nutrition:   TPN Clear liquid diet 9/16  Nutritional Goals:  2100-2300 kCal, 110-125 grams of protein per day   Plan:  - Continue Clinimix E 5/15 to 100 ml/hr (at goal of 100 ml/hr) and continue lipids at 10 ml/hr.  TPN will meet 100% of minimal needs. - Daily multivitamin and trace elements in TPN - Pepcid '40mg'$  in TPN - Add Vitamin C '500mg'$  and Zinc '5mg'$  to help with wound healing - Continue sensitive SSI Q6H.  D/C if CBGs remain controlled at goal TPN rate. - F/U advancement of diet  Sloan Leiter, PharmD, BCPS Clinical Pharmacist [phone] 06/13/2016,  9:04 AM

## 2016-06-13 NOTE — Progress Notes (Signed)
2 Days Post-Op  Subjective: No complaints. Wants to eat  Objective: Vital signs in last 24 hours: Temp:  [98 F (36.7 C)-99.6 F (37.6 C)] 98.5 F (36.9 C) (09/16 0522) Pulse Rate:  [58-67] 58 (09/16 0522) Resp:  [16-17] 16 (09/16 0522) BP: (115-135)/(64-71) 135/71 (09/16 0522) SpO2:  [98 %-99 %] 99 % (09/16 0522) Last BM Date: 06/12/16  Intake/Output from previous day: 09/15 0701 - 09/16 0700 In: 1312.9 [I.V.:1212.9; IV Piggyback:100] Out: 1025 [Urine:3280] Intake/Output this shift: No intake/output data recorded.  Resp: clear to auscultation bilaterally Cardio: regular rate and rhythm GI: soft, nontender. ostomy viable and productive  Lab Results:   Recent Labs  06/12/16 0459 06/13/16 0540  WBC 12.7* 13.3*  HGB 11.1* 10.7*  HCT 33.8* 32.5*  PLT 342 378   BMET  Recent Labs  06/11/16 0500 06/13/16 0540  NA 139 140  K 3.6 4.1  CL 106 109  CO2 26 26  GLUCOSE 120* 125*  BUN 10 13  CREATININE 1.11 0.99  CALCIUM 8.3* 8.2*   PT/INR No results for input(s): LABPROT, INR in the last 72 hours. ABG No results for input(s): PHART, HCO3 in the last 72 hours.  Invalid input(s): PCO2, PO2  Studies/Results: No results found.  Anti-infectives: Anti-infectives    Start     Dose/Rate Route Frequency Ordered Stop   06/05/16 2200  piperacillin-tazobactam (ZOSYN) IVPB 3.375 g     3.375 g 12.5 mL/hr over 240 Minutes Intravenous Every 8 hours 06/05/16 1602     06/05/16 1800  clindamycin (CLEOCIN) IVPB 600 mg  Status:  Discontinued     600 mg 100 mL/hr over 30 Minutes Intravenous Every 6 hours 06/05/16 1453 06/08/16 1155   06/05/16 1630  vancomycin (VANCOCIN) IVPB 750 mg/150 ml premix  Status:  Discontinued     750 mg 150 mL/hr over 60 Minutes Intravenous Every 12 hours 06/05/16 1605 06/08/16 1155   06/05/16 1615  piperacillin-tazobactam (ZOSYN) IVPB 3.375 g     3.375 g 100 mL/hr over 30 Minutes Intravenous  Once 06/05/16 1601 06/05/16 1744   06/05/16 1400   piperacillin-tazobactam (ZOSYN) IVPB 3.375 g  Status:  Discontinued     3.375 g 12.5 mL/hr over 240 Minutes Intravenous Every 8 hours 06/05/16 0937 06/05/16 1553   06/05/16 0800  clindamycin (CLEOCIN) IVPB 600 mg     600 mg 100 mL/hr over 30 Minutes Intravenous  Once 06/05/16 0759 06/05/16 0846   06/05/16 0645  vancomycin (VANCOCIN) IVPB 1000 mg/200 mL premix     1,000 mg 200 mL/hr over 60 Minutes Intravenous  Once 06/05/16 0637 06/05/16 0846   06/05/16 0645  piperacillin-tazobactam (ZOSYN) IVPB 3.375 g     3.375 g 12.5 mL/hr over 240 Minutes Intravenous  Once 06/05/16 8527 06/05/16 0825      Assessment/Plan: s/p Procedure(s): LAPAROSCOPIC  OPEN COLOSTOMY (N/A) Advance diet. Wean tpn once he tolerates food Buttocks with fistula to the rectum S/p laparoscopic colostomy, 06/12/16, Dr. [name] POD 2 Atrial fibrillation Heparin drip. Malnutrition - TNA FEN: NPO/TNA -  Add ice chips ID: Zosyn day 9 he has competed 3 days of Clindamycin and Vancomycin on 06/07/16 DVT: Heparin drip for AF  LOS: 8 days    Juan Wheeler,Juan Wheeler S 06/13/2016

## 2016-06-13 NOTE — Progress Notes (Signed)
[place] for Heparin Indication: atrial fibrillation  Allergies  Allergen Reactions  . Darvocet [Propoxyphene N-Acetaminophen] Palpitations  . Propoxyphene Palpitations    Patient Measurements: Height: '5\' 9"'$  (175.3 cm) Weight: 182 lb 15.7 oz (83 kg) IBW/kg (Calculated) : 70.7 Heparin Dosing Weight: 81 kg  Vital Signs: Temp: 98.5 F (36.9 C) (09/16 0522) Temp Source: Oral (09/16 0522) BP: 135/71 (09/16 0522) Pulse Rate: 58 (09/16 0522)  Labs:  Recent Labs  06/11/16 0500 06/12/16 0458 06/12/16 0459 06/12/16 2010 06/13/16 0540 06/13/16 1035  HGB 12.9*  --  11.1*  --  10.7*  --   HCT 37.8*  --  33.8*  --  32.5*  --   PLT 373  --  342  --  378  --   HEPARINUNFRC 0.30 <0.10*  --  0.30  --  0.52  CREATININE 1.11  --   --   --  0.99  --     Estimated Creatinine Clearance: 83.3 mL/min (by C-G formula based on SCr of 0.99 mg/dL).  Assessment: 55 year old male admitted 9/8 with a left gluteal abscess S/P multiple surgical I&D. Started on IV heparin on 9/10 for new onset Afib, held heparin 9/14 for laproscopic colostomy. Per surgery instruction,restarted IV heparin with no bolus and keep level at low end therapeutic range.  Renal function stable. Hgb down to 10.7 after surgery, pltc wnl. No bleeding noted in chart.   AM heparin level therapeutic at 0.52. Will decrease slightly to try and keep at low end.  Goal of Therapy:  Heparin level 0.3-0.7 units/ml (try to keep at low-end) Monitor platelets by anticoagulation protocol: Yes   Plan:  Decrease  heparin to 1750 units / hr 6 hour heparin level Daily heparin level and CBC Monitor for s/sx of bleeding  F/u plans for long-term anticoagulation.  Thank you Uvaldo Bristle, Sherian Rein D Pharmacy Resident [phone]   11:50 AM, 06/13/2016

## 2016-06-14 LAB — CBC
HCT: 35 % — ABNORMAL LOW (ref 39.0–52.0)
Hemoglobin: 11.5 g/dL — ABNORMAL LOW (ref 13.0–17.0)
MCH: 31.7 pg (ref 26.0–34.0)
MCHC: 32.9 g/dL (ref 30.0–36.0)
MCV: 96.4 fL (ref 78.0–100.0)
Platelets: 464 10*3/uL — ABNORMAL HIGH (ref 150–400)
RBC: 3.63 MIL/uL — ABNORMAL LOW (ref 4.22–5.81)
RDW: 14.8 % (ref 11.5–15.5)
WBC: 14.6 10*3/uL — ABNORMAL HIGH (ref 4.0–10.5)

## 2016-06-14 LAB — HEMOGLOBIN A1C
Hgb A1c MFr Bld: 5.9 % — ABNORMAL HIGH (ref 4.8–5.6)
Mean Plasma Glucose: 123 mg/dL

## 2016-06-14 LAB — GLUCOSE, CAPILLARY
Glucose-Capillary: 103 mg/dL — ABNORMAL HIGH (ref 65–99)
Glucose-Capillary: 125 mg/dL — ABNORMAL HIGH (ref 65–99)

## 2016-06-14 LAB — HEPARIN LEVEL (UNFRACTIONATED): Heparin Unfractionated: 0.66 IU/mL (ref 0.30–0.70)

## 2016-06-14 MED ORDER — GLUCERNA SHAKE PO LIQD
237.0000 mL | Freq: Three times a day (TID) | ORAL | Status: DC
Start: 2016-06-14 — End: 2016-06-16
  Administered 2016-06-14 (×3): 237 mL via ORAL

## 2016-06-14 MED ORDER — VITAMIN C 500 MG PO TABS
500.0000 mg | ORAL_TABLET | Freq: Every day | ORAL | Status: DC
  Administered 2016-06-15 – 2016-06-16 (×2): 500 mg via ORAL
  Filled 2016-06-14 (×2): qty 1

## 2016-06-14 MED ORDER — ZINC SULFATE 220 (50 ZN) MG PO CAPS
220.0000 mg | ORAL_CAPSULE | Freq: Every day | ORAL | Status: DC
  Administered 2016-06-15 – 2016-06-16 (×2): 220 mg via ORAL
  Filled 2016-06-14 (×2): qty 1

## 2016-06-14 MED ORDER — APIXABAN 5 MG PO TABS
5.0000 mg | ORAL_TABLET | Freq: Two times a day (BID) | ORAL | Status: DC
  Administered 2016-06-14 – 2016-06-16 (×5): 5 mg via ORAL
  Filled 2016-06-14 (×5): qty 1

## 2016-06-14 MED ORDER — FAMOTIDINE 20 MG PO TABS
20.0000 mg | ORAL_TABLET | Freq: Two times a day (BID) | ORAL | Status: DC
  Administered 2016-06-15 – 2016-06-16 (×3): 20 mg via ORAL
  Filled 2016-06-14 (×3): qty 1

## 2016-06-14 MED ORDER — INSULIN ASPART 100 UNIT/ML ~~LOC~~ SOLN: 0.0000 [IU] | Freq: Three times a day (TID) | SUBCUTANEOUS | Status: DC

## 2016-06-14 NOTE — Progress Notes (Signed)
[place] for Heparin Indication: atrial fibrillation  Allergies  Allergen Reactions  . Darvocet [Propoxyphene N-Acetaminophen] Palpitations  . Propoxyphene Palpitations    Patient Measurements: Height: '5\' 9"'$  (175.3 cm) Weight: 182 lb 15.7 oz (83 kg) IBW/kg (Calculated) : 70.7 Heparin Dosing Weight: 81 kg  Vital Signs: Temp: 98.2 F (36.8 C) (09/17 0538) Temp Source: Oral (09/17 0538) BP: 111/68 (09/17 0538) Pulse Rate: 60 (09/17 0538)  Labs:  Recent Labs  06/12/16 0459 06/12/16 2010 06/13/16 0540 06/13/16 1035 06/14/16 0450  HGB 11.1*  --  10.7*  --   --   HCT 33.8*  --  32.5*  --   --   PLT 342  --  378  --   --   HEPARINUNFRC  --  0.30  --  0.52 0.66  CREATININE  --   --  0.99  --   --     Estimated Creatinine Clearance: 83.3 mL/min (by C-G formula based on SCr of 0.99 mg/dL).  Assessment: 55 year old male admitted 9/8 with a left gluteal abscess S/P multiple surgical I&D. Started on IV heparin on 9/10 for new onset Afib, held heparin 9/14 for laproscopic colostomy.Heparin was re-started on 9/15 after surgery with instructions to keep the patient at the lower end of the heparin range. Renal function stable. Hgb down to 10.7 after surgery, pltc wnl. No bleeding noted in chart or by nurse.   AM heparin level therapeutic at 0.66. Continues to trend upwards so will decrease rate again to try and keep therapeutic.   Goal of Therapy:  Heparin level 0.3-0.7 units/ml (try to keep at low-end) Monitor platelets by anticoagulation protocol: Yes   Plan:  Decrease  heparin to 1700 units / hr Daily heparin level and CBC Monitor for s/sx of bleeding  F/u plans for long-term anticoagulation per surgery.   Thank you Uvaldo Bristle, Sherian Rein D Pharmacy Resident [phone]   7:38 AM, 06/14/2016

## 2016-06-14 NOTE — Progress Notes (Signed)
ANTICOAGULATION CONSULT NOTE - Initial Consult  Pharmacy Consult for  apixaban  Indication: atrial fibrillation  Allergies  Allergen Reactions  . Darvocet [Propoxyphene N-Acetaminophen] Palpitations  . Propoxyphene Palpitations    Patient Measurements: Height: '5\' 9"'$  (175.3 cm) Weight: 182 lb 15.7 oz (83 kg) IBW/kg (Calculated) : 70.7   Vital Signs: Temp: 98.2 F (36.8 C) (09/17 0538) Temp Source: Oral (09/17 0538) BP: 111/68 (09/17 0538) Pulse Rate: 60 (09/17 0538)  Labs:  Recent Labs  06/12/16 0459 06/12/16 2010 06/13/16 0540 06/13/16 1035 06/14/16 0450  HGB 11.1*  --  10.7*  --   --   HCT 33.8*  --  32.5*  --   --   PLT 342  --  378  --   --   HEPARINUNFRC  --  0.30  --  0.52 0.66  CREATININE  --   --  0.99  --   --     Estimated Creatinine Clearance: 83.3 mL/min (by C-G formula based on SCr of 0.99 mg/dL).   Medical History: Past Medical History:  Diagnosis Date  . Chronic lower back pain    a. Followed by pain management at [facility]  . Colon cancer [facility])    rectal cancer  . Coronary artery disease    a. 03/2013: abnl nuc -> LHC s/p DES to LCx, residual moderate disease in LAD (med rx unless refractory angina). b. Not on BB due to bradycardia.  Marland Kitchen DVT (deep venous thrombosis) ([place]) ~ 2013  . GERD (gastroesophageal reflux disease)   . History of blood transfusion    "once; after throwing up alot of blood" (04/17/2013)  . Hypertension   . LV dysfunction    a. EF 45% in 03/2013.  Marland Kitchen PAD (peripheral artery disease) ([place])    a. Occlusion of the right internal iliac artery, with significant atherosclerosis in the left internal iliac which was not amenable to reconstruction per notes from [facility].  . Pulmonary nodules 09/30/2014  . Rectal cancer ([place])   . Tobacco abuse     Medications:  Scheduled:  . apixaban  5 mg Oral BID  . [START ON 06/15/2016] famotidine  20 mg Oral BID  . feeding supplement (GLUCERNA SHAKE)  237 mL Oral TID BM  . insulin aspart   0-9 Units Subcutaneous TID WC  . isosorbide mononitrate  15 mg Oral Daily  . metoprolol tartrate  25 mg Oral BID  . piperacillin-tazobactam (ZOSYN)  IV  3.375 g Intravenous Q8H  . [START ON 06/15/2016] vitamin C  500 mg Oral Daily  . [START ON 06/15/2016] zinc sulfate  220 mg Oral Daily    Assessment: Juan Wheeler is a 55 yo male who presented to [facility] 9/8 for severe sepsis. An I and D was performed 9/10 and a colostomy was done 9/14. Patient also was discovered to have new onset afib while in the ICU. He was treated with heparin due to his procedures and will now be transitioned to apixaban.  Patient has a stable CBC and no signs of bleeding have been noted. Patients renal function is stable with a Scr of 0.99 and he has no other risk factors for dose adjustment.   Plan:  Apixaban 5 mg BID  Monitor CBC, s/sx of bleeding Pharmacy will [place], [place] Pharmacy Resident [phone] 06/14/2016,12:26 PM

## 2016-06-14 NOTE — Progress Notes (Signed)
[facility] PROGRESS NOTE  Juan Wheeler PYP:[id number] DOB: [birth year] DOA: 06/05/2016 PCP: [name], MD    Interim summary and HPI 55 year old male admitted from [facility] 9/8 with a left buttock abscess status post surgical I&D. Transferred to [facility] 9/8 for Severe sepsis. He clinically improved with Abx and IVF. Levophed was weaned off.  I and D done on 9/10. Had intermittent afib and aflutter while in ICU.  Now stable and extubated.  Transfer to telemetry bed and to [facility] hospital on 9/12 Needed further surgery 06/11/16 for fistula  Assessment/Plan: 1-Sepsis due to left buttock abscess with fistula to rectum -improved, sepsis physiology resolved -Initial surgery at [facility] on 9/8 with I&D of L buttock -then repeat OR 9/10 per Dr.Wilson :DEBRIDEMENT of skin, subcutaneous tissue, and muscle of left buttock, pulsatile lavage -Next 9/14 per Dr.Hoxworth s/p lap diverting colectomy -per CCS, diet being advanced, Wean off TNA -remains on IV Zosyn-? Duration per CCS, blood Cx negative and deep wound cx didn't grow any particular organism -Ambulate, OOB, PT eval  2-new diagnosis of PAF: patient A. Fib/A. Flutter on telemetry -rate controlled currently in NSR -continue metoprolol  -ok to change to DOAC per CCS note, will stop Heparin and start Eliquis per Pharmacy -patient with normal TSH and normal 2-D echo -CHADsVASC score 3  3-essential HTN -continue imdur and metoprolol  4-AKI: in setting of sepsis  -Cr back to normal range now -on TNA   5-hx of adenocarcinoma of the rectum: -s/p resection WFU and currently in remission -follow up with Hem/Onc   6-chronic pain syndrome -continue outpatient pain medication regimen   7-GERD -will continue famotidine   8. imparied glucose tolerance -Fu Hbaic  Code Status: Full Family Communication: no family at bedside today Disposition Plan: per CCS  Consultants:  PCCM  General surgery   Procedures and significant  events : 9/8 CT Pelvis >> CT pelvis obtained which demonstrated changes consistent with significant cellulitis in the left buttock with diffuse irregular air collection medially within the buttock extending into the gluteal muscles and posterior upper left thigh/pelvic cavity. No definitive fluid component identified at that time. 9/08  Admit with left buttock abscess, fistula   imparied glucose t AP by Dr. Arnoldo Morale 9/10  Had further debridement and cleaning by CCS at [facility] 9/8>>9/10: intubated 9/14 Diverting colostomy perfomred by gen surg  Antibiotics: Zosyn 9/8 >>  Vanco 9/8 >> 9/11 Clinda 9/8 >> 9/11  HPI/Subjective:  Feels ok, tolerating liquids   Objective: Vitals:   06/14/16 0008 06/14/16 0538  BP: 114/74 111/68  Pulse: (!) 59 60  Resp: 19 18  Temp: 98.3 F (36.8 C) 98.2 F (36.8 C)    Intake/Output Summary (Last 24 hours) at 06/14/16 1211 Last data filed at 06/14/16 1012  Gross per 24 hour  Intake          2015.83 ml  Output             3725 ml  Net         -1709.17 ml   Filed Weights   06/07/16 0434 06/08/16 0400 06/10/16 0543  Weight: 90.3 kg (199 lb 1.2 oz) 88.6 kg (195 lb 5.2 oz) 83 kg (182 lb 15.7 oz)    Exam:   General:  AAOx3, no distress  Cardiovascular: no rubs, no gallops, irregular, no murmurs appreciated  Respiratory: CTA bilaterally  Abdomen: soft, NT, positive BS, ostomy noted  Musculoskeletal: no Le edema, no cyanosis   Skin: left buttock area with large open  wound,   Data Reviewed: Basic Metabolic Panel:  Recent Labs Lab 06/09/16 0928 06/10/16 0626 06/11/16 0500 06/13/16 0540  NA 139 137 139 140  K 4.2 4.1 3.6 4.1  CL 106 105 106 109  CO2 '25 23 26 26  '$ GLUCOSE 120* 107* 120* 125*  BUN '7 7 10 13  '$ CREATININE 1.02 1.11 1.11 0.99  CALCIUM 8.6* 8.7* 8.3* 8.2*  MG 1.9 2.0 2.1  --   PHOS 2.9 3.3 3.4  --    Liver Function Tests:  Recent Labs Lab 06/10/16 0626 06/11/16 0500 06/13/16 0540  AST 39 33 24  ALT 39 37 30   ALKPHOS 55 51 46  BILITOT 1.2 1.2 0.4  PROT 6.3* 5.7* 5.6*  ALBUMIN 2.3* 2.1* 2.0*   CBC:  Recent Labs Lab 06/09/16 0928 06/10/16 0626 06/11/16 0500 06/12/16 0459 06/13/16 0540  WBC 15.1* 12.4* 13.1* 12.7* 13.3*  NEUTROABS  --  9.3*  --   --  10.3*  HGB 14.0 14.5 12.9* 11.1* 10.7*  HCT 41.0 42.4 37.8* 33.8* 32.5*  MCV 93.6 94.2 93.8 95.5 95.6  PLT 332 356 373 342 378   Cardiac Enzymes: No results for input(s): CKTOTAL, CKMB, CKMBINDEX, TROPONINI in the last 168 hours. CBG:  Recent Labs Lab 06/12/16 2344 06/13/16 0523 06/13/16 1807 06/14/16 0009 06/14/16 0707  GLUCAP 114* 135* 130* 103* 125*    Recent Results (from the past 240 hour(s))  Blood culture (routine x 2)     Status: None   Collection Time: 06/05/16  6:45 AM  Result Value Ref Range Status   Specimen Description BLOOD RIGHT FOREARM DRAWN BY RN YW  Final   Special Requests BOTTLES DRAWN AEROBIC ONLY 5CC  Final   Culture NO GROWTH 5 DAYS  Final   Report Status 06/10/2016 FINAL  Final  Blood culture (routine x 2)     Status: None   Collection Time: 06/05/16  7:40 AM  Result Value Ref Range Status   Specimen Description BLOOD LEFT HAND DRAWN BY RN RH  Final   Special Requests BOTTLES DRAWN AEROBIC AND ANAEROBIC 5CC EACH  Final   Culture NO GROWTH 5 DAYS  Final   Report Status 06/10/2016 FINAL  Final  Aerobic/Anaerobic Culture (surgical/deep wound)     Status: None   Collection Time: 06/05/16 10:36 AM  Result Value Ref Range Status   Specimen Description BUTTOCKS LEFT  Final   Special Requests NONE  Final   Gram Stain   Final    RARE WBC PRESENT,BOTH PMN AND MONONUCLEAR NO ORGANISMS SEEN    Culture   Final    RARE PEPTOSTREPTOCOCCUS SPECIES FEW BACTEROIDES VULGATUS BETA LACTAMASE NEGATIVE Performed at [facility]    Report Status 06/09/2016 FINAL  Final  Culture, respiratory (NON-Expectorated)     Status: None   Collection Time: 06/05/16  5:08 PM  Result Value Ref Range Status    Specimen Description TRACHEAL ASPIRATE  Final   Special Requests NONE  Final   Gram Stain   Final    MODERATE WBC PRESENT,BOTH PMN AND MONONUCLEAR NO ORGANISMS SEEN    Culture Consistent with normal respiratory flora.  Final   Report Status 06/08/2016 FINAL  Final  MRSA PCR Screening     Status: None   Collection Time: 06/09/16 11:31 AM  Result Value Ref Range Status   MRSA by PCR NEGATIVE NEGATIVE Final    Comment:        The GeneXpert MRSA Assay (FDA approved for NASAL specimens  only), is one component of a comprehensive MRSA colonization surveillance program. It is not intended to diagnose MRSA infection nor to guide or monitor treatment for MRSA infections.   Surgical pcr screen     Status: None   Collection Time: 06/11/16  5:11 AM  Result Value Ref Range Status   MRSA, PCR NEGATIVE NEGATIVE Final   Staphylococcus aureus NEGATIVE NEGATIVE Final    Comment:        The Xpert SA Assay (FDA approved for NASAL specimens in patients over 62 years of age), is one component of a comprehensive surveillance program.  Test performance has been validated by [facility] for patients greater than or equal to 31 year old. It is not intended to diagnose infection nor to guide or monitor treatment.      Studies: No results found.  Scheduled Meds: . [START ON 06/15/2016] famotidine  20 mg Oral BID  . feeding supplement (GLUCERNA SHAKE)  237 mL Oral TID BM  . insulin aspart  0-9 Units Subcutaneous TID WC  . isosorbide mononitrate  15 mg Oral Daily  . metoprolol tartrate  25 mg Oral BID  . piperacillin-tazobactam (ZOSYN)  IV  3.375 g Intravenous Q8H  . [START ON 06/15/2016] vitamin C  500 mg Oral Daily  . [START ON 06/15/2016] zinc sulfate  220 mg Oral Daily   Continuous Infusions: . Marland KitchenTPN (CLINIMIX-E) Adult 50 mL/hr at 06/14/16 1144   And  . fat emulsion 240 mL (06/13/16 1745)    Active Problems:   Fournier's gangrene in male   Abscess   Sepsis ([place])   Necrotizing  fasciitis ([place])   Central venous catheter in place   Acute respiratory failure ([place])   AKI (acute kidney injury) ([place])   Time spent: 35 minutes  Domenic Polite, MD [facility] (P) [phone]

## 2016-06-14 NOTE — Progress Notes (Signed)
PARENTERAL NUTRITION CONSULT NOTE - FOLLOW UP  Pharmacy Consult:  TPN Indication:  Fistula from rectal anastomosis to buttock  Allergies  Allergen Reactions  . Darvocet [Propoxyphene N-Acetaminophen] Palpitations  . Propoxyphene Palpitations    Patient Measurements: Height: '5\' 9"'$  (175.3 cm) Weight: 182 lb 15.7 oz (83 kg) IBW/kg (Calculated) : 70.7  Vital Signs: Temp: 98.2 F (36.8 C) (09/17 0538) Temp Source: Oral (09/17 0538) BP: 111/68 (09/17 0538) Pulse Rate: 60 (09/17 0538) Intake/Output from previous day: 09/16 0701 - 09/17 0700 In: 1445.8 [I.V.:1245.8; IV Piggyback:200] Out: 7902 [Urine:3575; Stool:250] Intake/Output from this shift: No intake/output data recorded.  Labs:  Recent Labs  06/12/16 0459 06/13/16 0540  WBC 12.7* 13.3*  HGB 11.1* 10.7*  HCT 33.8* 32.5*  PLT 342 378     Recent Labs  06/13/16 0540  NA 140  K 4.1  CL 109  CO2 26  GLUCOSE 125*  BUN 13  CREATININE 0.99  CALCIUM 8.2*  PROT 5.6*  ALBUMIN 2.0*  AST 24  ALT 30  ALKPHOS 46  BILITOT 0.4   Estimated Creatinine Clearance: 83.3 mL/min (by C-G formula based on SCr of 0.99 mg/dL).    Recent Labs  06/13/16 1807 06/14/16 0009 06/14/16 0707  GLUCAP 130* 103* 125*     Insulin Requirements in the past 24 hours:  1 unit of sensitive SSI q6h  Assessment: 89 YOM with history of colon and rectal cancer post chemo and ileostomy with closure in 2012.  Patient presented to [facility] on 06/05/16 with left buttock pain and transferred to [facility] for further management.  Now s/p multiple I&D's with no further I&D's planned.  On 06/09/16, found to have fistula to the buttocks and Pharmacy consulted to initiate TPN for nutritional support.  Noted patient reported no recent weight loss and was eating less during the hospitalization to avoid having diarrhea.  GI: GERD - Pepcid in TPN.  Baseline prealbumin low at 8.8.  BM x2.  s/p transverse colostomy on 9/14. Full liquid diet initiated 9/16.- Patient  reports ate well last night (100%) and this morning (~75%). States didn't finish breakfast because didn't like the taste but could have eaten.  Endo: TSH WNL.  No hx DM - CBGs controlled Lytes: all WNL Renal: SCr 0.99, CrCL 83 ml/min, BUN WNL - good UOP 1.4 ml/kg/hr Pulm: nodules / tobacco - back up to 3L [place] post-operatively Cards: CAD / HTN / PAD - VSS - Imdur, Lopressor Hepatobil: LFTs / tbili / TG WNL AC: Heparin for new-onset Afib - resumed 9/15 post-op, CBC stable, no bleeding Neuro: chronic back pain - PRN oxycodone/morphine ID: on Zosyn d#10, s/p Vanc & Clinda, for gluteal abscess, mult I&Ds - afebrile, WBC 13.3.  Best Practices: heparin gtt   TPN Access: PICC placed 06/10/16 TPN start date: 06/10/16  Current Nutrition:   TPN Full liquid diet 9/16  Glucerna TID between meals   Nutritional Goals:  2100-2300 kCal, 110-125 grams of protein per day   Plan:  - Wean TNA to off today - Decrease rate to 50 mL/hr x6 hours, then discontinue at 1800 - Continue 20% lipid emulsion at current rate until 1800.   - Change Pepcid to 20 po BID.  - Change Vitamin C and Zinc to oral doses.  - Change SSI to [place] will sign off.   Sloan Leiter, PharmD, BCPS Clinical Pharmacist [phone] 06/14/2016, 8:20 AM

## 2016-06-14 NOTE — Discharge Instructions (Signed)

## 2016-06-14 NOTE — Progress Notes (Signed)
Progress Note: General Surgery Service   Subjective: abd pain improved, ostomy with good output, tolerating liquids  Objective: Vital signs in last 24 hours: Temp:  [98.2 F (36.8 C)-98.3 F (36.8 C)] 98.2 F (36.8 C) (09/17 0538) Pulse Rate:  [59-75] 60 (09/17 0538) Resp:  [18-19] 18 (09/17 0538) BP: (111-140)/(68-77) 111/68 (09/17 0538) SpO2:  [95 %] 95 % (09/17 0538) Last BM Date: 06/14/16  Intake/Output from previous day: 09/16 0701 - 09/17 0700 In: 1445.8 [I.V.:1245.8; IV Piggyback:200] Out: 3825 [Urine:3575; Stool:250] Intake/Output this shift: Total I/O In: 570 [P.O.:570] Out: 800 [Urine:800]  Lungs: CTAB  Cardiovascular: RRR  Abd: soft, ATTP, ND, ostomy with semisolid output  Extremities: no edema  Neuro: AOx4  Lab Results: CBC   Recent Labs  06/12/16 0459 06/13/16 0540  WBC 12.7* 13.3*  HGB 11.1* 10.7*  HCT 33.8* 32.5*  PLT 342 378   BMET  Recent Labs  06/13/16 0540  NA 140  K 4.1  CL 109  CO2 26  GLUCOSE 125*  BUN 13  CREATININE 0.99  CALCIUM 8.2*   PT/INR No results for input(s): LABPROT, INR in the last 72 hours. ABG No results for input(s): PHART, HCO3 in the last 72 hours.  Invalid input(s): PCO2, PO2  Studies/Results:  Anti-infectives: Anti-infectives    Start     Dose/Rate Route Frequency Ordered Stop   06/05/16 2200  piperacillin-tazobactam (ZOSYN) IVPB 3.375 g     3.375 g 12.5 mL/hr over 240 Minutes Intravenous Every 8 hours 06/05/16 1602     06/05/16 1800  clindamycin (CLEOCIN) IVPB 600 mg  Status:  Discontinued     600 mg 100 mL/hr over 30 Minutes Intravenous Every 6 hours 06/05/16 1453 06/08/16 1155   06/05/16 1630  vancomycin (VANCOCIN) IVPB 750 mg/150 ml premix  Status:  Discontinued     750 mg 150 mL/hr over 60 Minutes Intravenous Every 12 hours 06/05/16 1605 06/08/16 1155   06/05/16 1615  piperacillin-tazobactam (ZOSYN) IVPB 3.375 g     3.375 g 100 mL/hr over 30 Minutes Intravenous  Once 06/05/16 1601  06/05/16 1744   06/05/16 1400  piperacillin-tazobactam (ZOSYN) IVPB 3.375 g  Status:  Discontinued     3.375 g 12.5 mL/hr over 240 Minutes Intravenous Every 8 hours 06/05/16 0937 06/05/16 1553   06/05/16 0800  clindamycin (CLEOCIN) IVPB 600 mg     600 mg 100 mL/hr over 30 Minutes Intravenous  Once 06/05/16 0759 06/05/16 0846   06/05/16 0645  vancomycin (VANCOCIN) IVPB 1000 mg/200 mL premix     1,000 mg 200 mL/hr over 60 Minutes Intravenous  Once 06/05/16 0637 06/05/16 0846   06/05/16 0645  piperacillin-tazobactam (ZOSYN) IVPB 3.375 g     3.375 g 12.5 mL/hr over 240 Minutes Intravenous  Once 06/05/16 0637 06/05/16 0825      Medications: Scheduled Meds: . feeding supplement (GLUCERNA SHAKE)  237 mL Oral TID BM  . insulin aspart  0-9 Units Subcutaneous Q6H  . isosorbide mononitrate  15 mg Oral Daily  . metoprolol tartrate  25 mg Oral BID  . piperacillin-tazobactam (ZOSYN)  IV  3.375 g Intravenous Q8H   Continuous Infusions: . Marland KitchenTPN (CLINIMIX-E) Adult 100 mL/hr at 06/13/16 1746   And  . fat emulsion 240 mL (06/13/16 1745)  . heparin 1,700 Units/hr (06/14/16 0947)   PRN Meds:.sodium chloride, acetaminophen, hydrALAZINE, morphine injection, ondansetron (ZOFRAN) IV, oxyCODONE, sodium chloride flush  Assessment/Plan: Patient Active Problem List   Diagnosis Date Noted  . AKI (acute kidney injury) ([place])   .  Fournier's gangrene in male 06/05/2016  . Abscess 06/05/2016  . Sepsis ([place])   . Necrotizing fasciitis ([place])   . Central venous catheter in place   . Acute respiratory failure ([place])   . PAD (peripheral artery disease) ([place]) 07/02/2015  . Preoperative cardiovascular examination 06/12/2015  . Cardiomyopathy, ischemic 06/12/2015  . Pulmonary nodules 09/30/2014  . ASCVD (arteriosclerotic cardiovascular disease) 05/02/2013  . Unstable angina ([place]) 04/18/2013  . Chest pain 03/29/2013  . Tobacco abuse 03/29/2013  . Chronic pain syndrome 11/09/2012  . Pain in joint, pelvic region  and thigh 11/09/2012  . Neuralgia and neuritis 10/12/2012  . Atherosclerosis of native arteries of extremity with intermittent claudication ([place]) 08/19/2012  . Backache 03/24/2012  . Peripheral vascular disease ([place]) 12/04/2011  . Compression of vein 12/04/2011  . Heartburn 12/03/2011  . Personal history of digestive disease 11/19/2011  . Depressive disorder 09/24/2011  . Dysuria 08/31/2011  . Impotence of organic origin 08/31/2011  . Urinary frequency 08/31/2011  . Constipation 06/05/2011  . Essential hypertension 06/05/2011  . Anal or rectal pain 06/05/2011  . Malignant neoplasm of rectum ([place]) 12/02/2010   s/p Procedure(s): LAPAROSCOPIC  OPEN COLOSTOMY 06/11/2016 -advance diet, can likely start decreasing TPN -add glucerna to encourage protein intake -continue ostomy care -continue current dressing care -ok to change to DOACs for a fib   LOS: 9 days   Mickeal Skinner, MD Pg# [phone] [facility]

## 2016-06-14 NOTE — Progress Notes (Signed)
Pharmacy Antibiotic Note Juan Wheeler is a 55 y.o. male admitted on 06/05/2016 with infected gluteal abscess s/p mult I&D.  Antibiotics narrowed to Zosyn monotherapy. Patient's renal function is stable.    Plan: - Continue zosyn 3.375gm IV Q8H, 4 hr infusion - Monitor renal fxn, clinical progress, further de-escalation - Spoke to surgery today and they will consider de-escalation or stopping ABX tomorrow  Height: '5\' 9"'$  (175.3 cm) Weight: 182 lb 15.7 oz (83 kg) IBW/kg (Calculated) : 70.7  Temp (24hrs), Avg:98.3 F (36.8 C), Min:98.2 F (36.8 C), Max:98.3 F (36.8 C)   Recent Labs Lab 06/09/16 0928 06/10/16 0626 06/11/16 0500 06/12/16 0459 06/13/16 0540  WBC 15.1* 12.4* 13.1* 12.7* 13.3*  CREATININE 1.02 1.11 1.11  --  0.99    Estimated Creatinine Clearance: 83.3 mL/min (by C-G formula based on SCr of 0.99 mg/dL).    Allergies  Allergen Reactions  . Darvocet [Propoxyphene N-Acetaminophen] Palpitations  . Propoxyphene Palpitations    Antimicrobials this admission: 9/8 Zosyn >>  9/8 Vancomycin  >> 9/11 9/8 Clindamycin >> 9/11  Dose adjustments this admission: 9/8 TA - negative 9/8 Blood x 2: NGF 9/8 left buttocks -Rare peptostreptococcus, Few bacteroides vulgatus beta lactamase    Uvaldo Bristle, Sherian Rein D Pharmacy Resident Pager: [phone]  06/14/2016, 12:33 PM

## 2016-06-15 ENCOUNTER — Encounter: Payer: Self-pay | Admitting: Gastroenterology

## 2016-06-15 ENCOUNTER — Encounter (HOSPITAL_COMMUNITY): Payer: Self-pay | Admitting: General Surgery

## 2016-06-15 LAB — BASIC METABOLIC PANEL
Anion gap: 7 (ref 5–15)
BUN: 11 mg/dL (ref 6–20)
CO2: 26 mmol/L (ref 22–32)
Calcium: 8.7 mg/dL — ABNORMAL LOW (ref 8.9–10.3)
Chloride: 105 mmol/L (ref 101–111)
Creatinine, Ser: 1.24 mg/dL (ref 0.61–1.24)
GFR calc Af Amer: >60 mL/min (ref >60)
GFR calc non Af Amer: >60 mL/min (ref >60)
Glucose, Bld: 103 mg/dL — ABNORMAL HIGH (ref 65–99)
Potassium: 4.1 mmol/L (ref 3.5–5.1)
Sodium: 138 mmol/L (ref 135–145)

## 2016-06-15 LAB — CBC
HCT: 34.6 % — ABNORMAL LOW (ref 39.0–52.0)
Hemoglobin: 11.6 g/dL — ABNORMAL LOW (ref 13.0–17.0)
MCH: 32 pg (ref 26.0–34.0)
MCHC: 33.5 g/dL (ref 30.0–36.0)
MCV: 95.3 fL (ref 78.0–100.0)
Platelets: 449 10*3/uL — ABNORMAL HIGH (ref 150–400)
RBC: 3.63 MIL/uL — ABNORMAL LOW (ref 4.22–5.81)
RDW: 14.7 % (ref 11.5–15.5)
WBC: 12.3 10*3/uL — ABNORMAL HIGH (ref 4.0–10.5)

## 2016-06-15 NOTE — Progress Notes (Signed)
Physical Therapy Treatment Patient Details Name: Juan Wheeler MRN: [id number] DOB: Sep 15, 1961 Today's Date: 06/15/2016    History of Present Illness Admitted from [facility] with L buttock abscess; no s/p 2 I&Ds and diverting colostomy;  has a past medical history of Chronic lower back pain; Colon cancer ([place]); Coronary artery disease; DVT (deep venous thrombosis) ([place]) (~ 2013); GERD (gastroesophageal reflux disease); History of blood transfusion; Hypertension; LV dysfunction; PAD (peripheral artery disease) ([place]); Pulmonary nodules (09/30/2014); Rectal cancer ([place]); and Tobacco abuse.    PT Comments    Patient is making gradual progress toward mobility but continues to demo decreased activity tolerance, strength, and mobility. Recommending ST-SNF for further skilled PT services to maximize independence and safety with mobility.   Follow Up Recommendations  SNF;Supervision for mobility/OOB     Equipment Recommendations  None recommended by PT    Recommendations for Other Services OT consult     Precautions / Restrictions Precautions Precautions: Fall Restrictions Weight Bearing Restrictions: No    Mobility  Bed Mobility Overal bed mobility: Needs Assistance Bed Mobility: Rolling;Sidelying to Sit Rolling: Min guard Sidelying to sit: Min guard       General bed mobility comments: Cues for technique; mod assist to elevate trunk to sit  Transfers Overall transfer level: Needs assistance Equipment used: Rolling walker (2 wheeled) Transfers: Sit to/from Stand Sit to Stand: Min assist         General transfer comment: assist to power up and gain balance   Ambulation/Gait Ambulation/Gait assistance: Min guard Ambulation Distance (Feet): 75 Feet Assistive device: Rolling walker (2 wheeled) Gait Pattern/deviations: Step-through pattern;Decreased stride length;Trunk flexed Gait velocity: decreased   General Gait Details: heavy reliance on RW; cues for proximity of  RW   Stairs            Wheelchair Mobility    Modified Rankin (Stroke Patients Only)       Balance                                    Cognition Arousal/Alertness: Awake/alert Behavior During Therapy: WFL for tasks assessed/performed Overall Cognitive Status: Within Functional Limits for tasks assessed                      Exercises      General Comments General comments (skin integrity, edema, etc.): pt with decreased activity tolerance and B UE weakness/shakiness with ambulation      Pertinent Vitals/Pain Pain Assessment: 0-10 Pain Score: 5  Pain Location: buttock Pain Descriptors / Indicators: Aching;Discomfort;Grimacing;Guarding;Sore Pain Intervention(s): Limited activity within patient's tolerance;Monitored during session;Premedicated before session;Repositioned    Home Living                      Prior Function            PT Goals (current goals can now be found in the care plan section) Acute Rehab PT Goals Patient Stated Goal: walk PT Goal Formulation: With patient Time For Goal Achievement: 06/28/16 Potential to Achieve Goals: Good Progress towards PT goals: Progressing toward goals    Frequency    Min 3X/week      PT Plan Current plan remains appropriate    Co-evaluation             End of Session Equipment Utilized During Treatment: Gait belt Activity Tolerance: Patient tolerated treatment well Patient left: with call bell/phone within  reach;with chair alarm set;in bed     Time: 8003-4917 PT Time Calculation (min) (ACUTE ONLY): 30 min  Charges:  $Gait Training: 8-22 mins $Therapeutic Activity: 8-22 mins                    G Codes:      Salina April, PTA Pager: [phone]   06/15/2016, 12:58 PM

## 2016-06-15 NOTE — Consult Note (Addendum)
[place] Nurse ostomy follow up Surgery team following for assessment and plan of care to buttocks wound. Stoma type/location: Colostomy stoma to RLQ Stomal assessment/size: Stoma dusky red, above skin level, 2 inches Peristomal assessment:  Intact skin surrounding Output: Small amt blood-tinged drainage, no stool or flatus  Ostomy pouching: 1pc.  Education provided: Demonstrated pouch change using one piece pouch. Pt states he is familiar with pouching routines since he had an ostomy previously. He is able to open and close the pouch to empty and denies further questions. Supplies ordered to the bedside for staff nurse use. He plans to discharge to a SNF where he will have assistance with pouching activities. Enrolled patient in [place] Start Discharge program: Yes Julien Girt MSN, RN, [place], [place], [place]

## 2016-06-15 NOTE — Clinical Social Work Note (Signed)
Clinical Social Work Assessment  Patient Details  Name: Juan Wheeler MRN: [id number] Date of Birth: 1961-08-04  Date of referral:  06/15/16               Reason for consult:  Facility Placement                Permission sought to share information with:  Facility Sport and exercise psychologist, Family Supports Permission granted to share information::  Yes, Verbal Permission Granted  Name::        Agency::     Relationship::     Contact Information:     Housing/Transportation Living arrangements for the past 2 months:  Single Family Home Source of Information:  Patient Patient Interpreter Needed:  None Criminal Activity/Legal Involvement Pertinent to Current Situation/Hospitalization:  No - Comment as needed Significant Relationships:  Spouse Lives with:  Spouse Do you feel safe going back to the place where you live?  No Need for family participation in patient care:  No (Coment)  Care giving concerns:  CSW received consult for possible SNF placement at time of discharge. CSW met with patient regarding PT recommendation of SNF placement at time of discharge. Patient expressed understanding of PT recommendation and is agreeable to SNF placement at time of discharge. CSW to continue to follow and assist with discharge planning needs.   Social Worker assessment / plan:  CSW spoke with patient concerning possibility of rehab at [facility] before returning home.  Employment status:  Disabled (Comment on whether or not currently receiving Disability) Insurance information:  Managed Medicare PT Recommendations:  [place] / Referral to community resources:  [place]  Patient/Family's Response to care:  Patient recognizes need for rehab before returning home and is agreeable to a SNF in [place].   Patient/Family's Understanding of and Emotional Response to Diagnosis, Current Treatment, and Prognosis:  Patient/family is realistic regarding therapy  needs and expressed being hopeful for SNF placement. Patient expressed understanding of CSW role and discharge process. No questions/concerns about plan or treatment.    Emotional Assessment Appearance:  Appears stated age Attitude/Demeanor/Rapport:  Other (Appropriate) Affect (typically observed):  Accepting, Appropriate, Quiet Orientation:  Oriented to Self, Oriented to Place, Oriented to Situation, Oriented to  Time Alcohol / Substance use:  Not Applicable Psych involvement (Current and /or in the community):  No (Comment)  Discharge Needs  Concerns to be addressed:  Care Coordination Readmission within the last 30 days:  No Current discharge risk:  None Barriers to Discharge:  Continued Medical Work up   [employer], [place] 06/15/2016, 2:46 PM

## 2016-06-15 NOTE — Progress Notes (Signed)
4 Days Post-Op  Subjective: Tolerating diet  Objective: Vital signs in last 24 hours: Temp:  [98 F (36.7 C)-98.4 F (36.9 C)] 98 F (36.7 C) (09/18 0657) Pulse Rate:  [62-80] 63 (09/18 0657) Resp:  [18] 18 (09/18 0657) BP: (122-155)/(70-84) 128/70 (09/18 0859) SpO2:  [92 %-99 %] 97 % (09/18 0657) Weight:  [79.9 kg (176 lb 2.4 oz)] 79.9 kg (176 lb 2.4 oz) (09/18 0657) Last BM Date: 06/14/16  Intake/Output from previous day: 09/17 0701 - 09/18 0700 In: 570 [P.O.:570] Out: 3200 [Urine:3150; Stool:50] Intake/Output this shift: Total I/O In: 20 [I.V.:20] Out: 200 [Urine:200]  General appearance: cooperative GI: soft, colostomy pink with stool  Lab Results:   Recent Labs  06/14/16 1135 06/15/16 0500  WBC 14.6* 12.3*  HGB 11.5* 11.6*  HCT 35.0* 34.6*  PLT 464* 449*   BMET  Recent Labs  06/13/16 0540 06/15/16 0500  NA 140 138  K 4.1 4.1  CL 109 105  CO2 26 26  GLUCOSE 125* 103*  BUN 13 11  CREATININE 0.99 1.24  CALCIUM 8.2* 8.7*   PT/INR No results for input(s): LABPROT, INR in the last 72 hours. ABG No results for input(s): PHART, HCO3 in the last 72 hours.  Invalid input(s): PCO2, PO2  Studies/Results: No results found.  Anti-infectives: Anti-infectives    Start     Dose/Rate Route Frequency Ordered Stop   06/05/16 2200  piperacillin-tazobactam (ZOSYN) IVPB 3.375 g     3.375 g 12.5 mL/hr over 240 Minutes Intravenous Every 8 hours 06/05/16 1602     06/05/16 1800  clindamycin (CLEOCIN) IVPB 600 mg  Status:  Discontinued     600 mg 100 mL/hr over 30 Minutes Intravenous Every 6 hours 06/05/16 1453 06/08/16 1155   06/05/16 1630  vancomycin (VANCOCIN) IVPB 750 mg/150 ml premix  Status:  Discontinued     750 mg 150 mL/hr over 60 Minutes Intravenous Every 12 hours 06/05/16 1605 06/08/16 1155   06/05/16 1615  piperacillin-tazobactam (ZOSYN) IVPB 3.375 g     3.375 g 100 mL/hr over 30 Minutes Intravenous  Once 06/05/16 1601 06/05/16 1744   06/05/16 1400   piperacillin-tazobactam (ZOSYN) IVPB 3.375 g  Status:  Discontinued     3.375 g 12.5 mL/hr over 240 Minutes Intravenous Every 8 hours 06/05/16 0937 06/05/16 1553   06/05/16 0800  clindamycin (CLEOCIN) IVPB 600 mg     600 mg 100 mL/hr over 30 Minutes Intravenous  Once 06/05/16 0759 06/05/16 0846   06/05/16 0645  vancomycin (VANCOCIN) IVPB 1000 mg/200 mL premix     1,000 mg 200 mL/hr over 60 Minutes Intravenous  Once 06/05/16 0637 06/05/16 0846   06/05/16 0645  piperacillin-tazobactam (ZOSYN) IVPB 3.375 g     3.375 g 12.5 mL/hr over 240 Minutes Intravenous  Once 06/05/16 0973 06/05/16 0825      Assessment/Plan: s/p Procedure(s): LAPAROSCOPIC  OPEN COLOSTOMY (N/A) 9/14 Doing well, tolerating diet Rectal CA with fistula - on Zosyn   LOS: 10 days    Cassy Sprowl E 06/15/2016

## 2016-06-15 NOTE — Progress Notes (Signed)
[facility] PROGRESS NOTE  Juan Wheeler CWC:[id number] DOB: [birth year] DOA: 06/05/2016 PCP: [name], MD    Interim summary and HPI 55 year old male with history of rectal adenocarcinoma treated with Chemotherapy/XRT and LAR with diverting loop ileostomy and then subsequent ileostomy closure in 2012 at [facility], CAD status post stent, peripheral arterial disease, DVT no longer on anticoagulation, hypertension admitted from [facility] 9/8 with a left buttock abscess status post surgical I&D. Transferred to [facility] same day 9/8 for Severe sepsis. He clinically improved with Abx and IVF. Levophed was weaned off.  I and D done on 9/10. Had intermittent afib and aflutter while in ICU.  Now stable and extubated.  Transfer to telemetry bed and to [facility] hospital on 9/12 Needed further surgery 06/11/16 for fistula  Assessment/Plan: 1-Sepsis due to left buttock abscess with fistula to rectum -h/o rectal CA s/p Chemo/XRt and LAR in 2012-[facility] -improved, sepsis physiology resolved -Initial surgery at [facility] on 9/8 with I&D of L buttock by Dr.Jenkins -then repeat OR 9/10 per Dr.Wilson :DEBRIDEMENT of skin, subcutaneous tissue, and muscle of left buttock, pulsatile lavage -Next 9/14 per Dr.Hoxworth s/p lap diverting colostomy -per CCS, diet being advanced, Weaned off TNA 9/17 -remains on IV Zosyn-? Duration per CCS, blood Cx negative and deep wound cx didn't grow any particular organism -Ambulate, OOB, PT eval -wound Care-think he needs SNF for this  2-new diagnosis of PAF: patient A. Fib/A. Flutter noted in ICU on telemetry -rate controlled currently in NSR now -continue metoprolol  -was on IV heparin through this admission, now changed to Eliquis-new med -patient with normal TSH and normal 2-D echo -CHADsVASC score 3  3-essential HTN -continue imdur and metoprolol  4-AKI: in setting of sepsis  -improved and normal now -on TNA   5-hx of adenocarcinoma of the rectum: -s/p  resection and chemo/XRt at [facility] in 7616  -now complicated in current setting with fistula, needs Onc/GI follow up  6-chronic pain syndrome -continue outpatient pain medication regimen   7-GERD -will continue famotidine   8. imparied glucose tolerance -Fu Hbaic 5.9  Code Status: Full Family Communication: no family at bedside today Disposition Plan: Hopefully SNF in few days  Consultants:  PCCM  General surgery   Procedures and significant events : 9/8 CT Pelvis >> CT pelvis obtained which demonstrated changes consistent with significant cellulitis in the left buttock with diffuse irregular air collection medially within the buttock extending into the gluteal muscles and posterior upper left thigh/pelvic cavity. No definitive fluid component identified at that time. 9/08  Admit with left buttock abscess, fistula   imparied glucose t AP by Dr. Arnoldo Morale 9/10  Had further debridement and cleaning by [facility] at [facility] 9/8>>9/10: intubated 9/14 Diverting colostomy perfomred by gen surg  Antibiotics: Zosyn 9/8 >>  Vanco 9/8 >> 9/11 Clinda 9/8 >> 9/11  HPI/Subjective:  Feels ok, tolerating liquids, wants regular food, hasnt gottent OOB yet   Objective: Vitals:   06/15/16 0657 06/15/16 0859  BP: (!) 155/84 128/70  Pulse: 63   Resp: 18   Temp: 98 F (36.7 C)     Intake/Output Summary (Last 24 hours) at 06/15/16 1351 Last data filed at 06/15/16 1033  Gross per 24 hour  Intake               20 ml  Output             2675 ml  Net            -2655 ml  Filed Weights   06/08/16 0400 06/10/16 0543 06/15/16 0657  Weight: 88.6 kg (195 lb 5.2 oz) 83 kg (182 lb 15.7 oz) 79.9 kg (176 lb 2.4 oz)    Exam:   General:  AAOx3, no distress  Cardiovascular: no rubs, no gallops, irregular, no murmurs appreciated  Respiratory: CTA bilaterally  Abdomen: soft, NT, positive BS, ostomy noted  Musculoskeletal: no Le edema, no cyanosis  Skin: left buttock area with large open  wound, dressing Data Reviewed: Basic Metabolic Panel:  Recent Labs Lab 06/09/16 0928 06/10/16 0626 06/11/16 0500 06/13/16 0540 06/15/16 0500  NA 139 137 139 140 138  K 4.2 4.1 3.6 4.1 4.1  CL 106 105 106 109 105  CO2 '25 23 26 26 26  '$ GLUCOSE 120* 107* 120* 125* 103*  BUN '7 7 10 13 11  '$ CREATININE 1.02 1.11 1.11 0.99 1.24  CALCIUM 8.6* 8.7* 8.3* 8.2* 8.7*  MG 1.9 2.0 2.1  --   --   PHOS 2.9 3.3 3.4  --   --    Liver Function Tests:  Recent Labs Lab 06/10/16 0626 06/11/16 0500 06/13/16 0540  AST 39 33 24  ALT 39 37 30  ALKPHOS 55 51 46  BILITOT 1.2 1.2 0.4  PROT 6.3* 5.7* 5.6*  ALBUMIN 2.3* 2.1* 2.0*   CBC:  Recent Labs Lab 06/10/16 0626 06/11/16 0500 06/12/16 0459 06/13/16 0540 06/14/16 1135 06/15/16 0500  WBC 12.4* 13.1* 12.7* 13.3* 14.6* 12.3*  NEUTROABS 9.3*  --   --  10.3*  --   --   HGB 14.5 12.9* 11.1* 10.7* 11.5* 11.6*  HCT 42.4 37.8* 33.8* 32.5* 35.0* 34.6*  MCV 94.2 93.8 95.5 95.6 96.4 95.3  PLT 356 373 342 378 464* 449*   Cardiac Enzymes: No results for input(s): CKTOTAL, CKMB, CKMBINDEX, TROPONINI in the last 168 hours. CBG:  Recent Labs Lab 06/12/16 2344 06/13/16 0523 06/13/16 1807 06/14/16 0009 06/14/16 0707  GLUCAP 114* 135* 130* 103* 125*    Recent Results (from the past 240 hour(s))  Culture, respiratory (NON-Expectorated)     Status: None   Collection Time: 06/05/16  5:08 PM  Result Value Ref Range Status   Specimen Description TRACHEAL ASPIRATE  Final   Special Requests NONE  Final   Gram Stain   Final    MODERATE WBC PRESENT,BOTH PMN AND MONONUCLEAR NO ORGANISMS SEEN    Culture Consistent with normal respiratory flora.  Final   Report Status 06/08/2016 FINAL  Final  MRSA PCR Screening     Status: None   Collection Time: 06/09/16 11:31 AM  Result Value Ref Range Status   MRSA by PCR NEGATIVE NEGATIVE Final    Comment:        The GeneXpert MRSA Assay (FDA approved for NASAL specimens only), is one component of  a comprehensive MRSA colonization surveillance program. It is not intended to diagnose MRSA infection nor to guide or monitor treatment for MRSA infections.   Surgical pcr screen     Status: None   Collection Time: 06/11/16  5:11 AM  Result Value Ref Range Status   MRSA, PCR NEGATIVE NEGATIVE Final   Staphylococcus aureus NEGATIVE NEGATIVE Final    Comment:        The Xpert SA Assay (FDA approved for NASAL specimens in patients over 50 years of age), is one component of a comprehensive surveillance program.  Test performance has been validated by [facility] for patients greater than or equal to 11 year old. It is not  intended to diagnose infection nor to guide or monitor treatment.      Studies: No results found.  Scheduled Meds: . apixaban  5 mg Oral BID  . famotidine  20 mg Oral BID  . feeding supplement (GLUCERNA SHAKE)  237 mL Oral TID BM  . isosorbide mononitrate  15 mg Oral Daily  . metoprolol tartrate  25 mg Oral BID  . piperacillin-tazobactam (ZOSYN)  IV  3.375 g Intravenous Q8H  . vitamin C  500 mg Oral Daily  . zinc sulfate  220 mg Oral Daily   Continuous Infusions:    Active Problems:   Fournier's gangrene in male   Abscess   Sepsis ([place])   Necrotizing fasciitis ([place])   Central venous catheter in place   Acute respiratory failure ([place])   AKI (acute kidney injury) ([place])   Time spent: 35 minutes  Domenic Polite, MD [facility] (P) [phone]

## 2016-06-15 NOTE — Progress Notes (Signed)
Nutrition Follow-up  DOCUMENTATION CODES:   Not applicable  INTERVENTION:   Continue Glucerna oral nutrition supplement TID between meals. Will change order such that only Strawberry and Butter Pecan flavors are provided.  NUTRITION DIAGNOSIS:   Increased nutrient needs related to wound healing as evidenced by estimated needs.  Ongoing  GOAL:   Patient will meet greater than or equal to 90% of their needs   Progressing  MONITOR:   PO intake, Supplement acceptance, Weight trends, Skin  REASON FOR ASSESSMENT:   Other (Comment) (Change in status) New TPN/TNA  ASSESSMENT:   55 year old male with a history of rectal cancer managed at [facility] by low anterior resection with diverting loop ileostomy/ s/p ileostomy reversal in 2014, who presented to [facility] ED with left buttock pain. S/P I&D of left buttock for Fournier's gangrene.  TPN was d/c on 06/14/16. Pt has been advanced to a carb modified diet.  Spoke with pt at bedside. Pt reports eating well at breakfast; he states he consumed most of his sausage and eggs. Breakfast meal completion not yet recorded in pt's chart; chart currently shows 0% meal completion. Pt also reports moderate appetite similar to his appetite PTA. Pt reveals that appetite is improving since extubation on 9/14. Pt awaiting lunch but not yet hungry. Pt states he will consume "a few bites" of different foods.  Per chart, pt currently receiving Glucerna oral nutrition supplement TID between meals. Pt confirms but states it "isn't working" for him. When asked why, pt states he does not like the [employer]. Pt denies any GI upset related to oral nutrition supplement. Will change order such that pt only receives [employer] and [employer].  Labs: low calcium (8.7) CBGs: 103-130 Medications: Pepcid, vitamin C, zinc sulfate  Diet Order:  Diet Carb Modified Fluid consistency: Thin; Room service appropriate? Yes  Skin:  Wound (see  comment) (Left buttock Fournier's gangrene S/P I&D)  Last BM:  06/14/2016  Height:   Ht Readings from Last 1 Encounters:  06/08/16 '5\' 9"'$  (1.753 m)    Weight:   Wt Readings from Last 1 Encounters:  06/15/16 176 lb 2.4 oz (79.9 kg)    Ideal Body Weight:  72.7 kg  BMI:  Body mass index is 26.01 kg/m.  Estimated Nutritional Needs:   Kcal:  4098-1191  Protein:  120-135 grams  Fluid:  2.3-2.5 L  EDUCATION NEEDS:   No education needs identified at this time  Jeb Levering Dietetic Intern Pager Number: [phone]

## 2016-06-15 NOTE — Telephone Encounter (Signed)
MADE APPOINTMENT AND MAILED LETTER TO PATIENT

## 2016-06-15 NOTE — Progress Notes (Signed)
Physical Therapy Evaluation (Late entry note for PT eval performed on 9/17)  Patient is s/p  surgery outlined in HPI resulting in functional limitations due to the deficits listed below (see PT Problem List). Difficulty getting a tolerable sitting position, even with chair cushion; Very motivated to get up and walk; noted unsteady and dependent on RW for support; Seemed pleased with ability to walk; Patient will benefit from skilled PT to increase their independence and safety with mobility to allow discharge to the venue listed below.         06/14/16 1400  PT Visit Information  Last PT Received On 06/14/16  Assistance Needed +1  History of Present Illness Admitted from [facility] with L buttock abscess; no s/p 2 I&Ds and diverting colostomy;  has a past medical history of Chronic lower back pain; Colon cancer ([place]); Coronary artery disease; DVT (deep venous thrombosis) ([place]) (~ 2013); GERD (gastroesophageal reflux disease); History of blood transfusion; Hypertension; LV dysfunction; PAD (peripheral artery disease) ([place]); Pulmonary nodules (09/30/2014); Rectal cancer ([place]); and Tobacco abuse.  Precautions  Precautions Fall  Home Living  Family/patient expects to be discharged to: Private residence  Living Arrangements Spouse/significant other  Available Help at Discharge Available PRN/intermittently;Family  Type of [place] to enter  Entrance Stairs-Number of Steps 2  Entrance Stairs-Rails None  Home Layout Two level;Full bath on main level;Able to live on main level with bedroom/bathroom  Bathroom Shower/Tub Tub/shower unit;Walk-in Patent examiner - 2 wheels  Prior Function  Level of Independence Independent  Comments did not drive  Communication  Communication No difficulties  Pain Assessment  Pain Assessment 0-10  Pain Score 5  Pain Location Buttock  Pain Descriptors / Indicators Aching;Discomfort;Grimacing;Guarding   Pain Intervention(s) Limited activity within patient's tolerance;Monitored during session;Repositioned  Cognition  Arousal/Alertness Awake/alert  Behavior During Therapy WFL for tasks assessed/performed  Overall Cognitive Status Within Functional Limits for tasks assessed  Upper Extremity Assessment  Upper Extremity Assessment Generalized weakness  Lower Extremity Assessment  Lower Extremity Assessment Generalized weakness  Bed Mobility  Overal bed mobility Needs Assistance  Bed Mobility Rolling;Sidelying to Sit  General bed mobility comments Cues for technique; mod assist to elevate trunk to sit  Transfers  Overall transfer level Needs assistance  Equipment used Rolling walker (2 wheeled)  Transfers Sit to/from Stand  Sit to Stand Mod assist  General transfer comment light mod assist to power up  Ambulation/Gait  Ambulation/Gait assistance Min guard  Ambulation Distance (Feet) 12 Feet  Assistive device Rolling walker (2 wheeled)  Gait Pattern/deviations Step-through pattern;Decreased step length - right;Decreased step length - left  General Gait Details Heavy dependence on UE support; cues to self-monitor for activi tytolerance  General Comments  General comments (skin integrity, edema, etc.) obtained air cushion for chair; still difficult to find a tolerable sitting position  PT - End of Session  Equipment Utilized During Treatment Gait belt  Activity Tolerance Patient tolerated treatment well  Patient left in chair;with call bell/phone within reach;with chair alarm set  Nurse Communication Mobility status  PT Assessment  PT Recommendation/Assessment Patient needs continued PT services  PT Problem List Decreased strength;Decreased activity tolerance;Decreased balance;Decreased mobility;Decreased knowledge of use of DME;Decreased safety awareness;Pain;Decreased knowledge of precautions  PT Plan  PT Frequency (ACUTE ONLY) Min 3X/week  PT Treatment/Interventions (ACUTE ONLY) DME  instruction;Gait training;Stair training;Functional mobility training;Therapeutic activities;Therapeutic exercise;Patient/family education  PT Recommendation  Recommendations for Other Services OT consult  Follow  Up Recommendations Home health PT;Supervision - Intermittent (if slow progress, will consider SNF)  PT equipment None recommended by PT  Individuals Consulted  Consulted and Agree with Results and Recommendations Patient  Acute Rehab PT Goals  Patient Stated Goal get better  PT Goal Formulation With patient  Time For Goal Achievement 06/28/16  Potential to Achieve Goals Good  PT Time Calculation  PT Start Time (ACUTE ONLY) 1144  PT Stop Time (ACUTE ONLY) 1214  PT Time Calculation (min) (ACUTE ONLY) 30 min  PT General Charges  $$ ACUTE PT VISIT 1 Procedure  PT Evaluation  $PT Eval Moderate Complexity 1 Procedure  PT Treatments  $Gait Training 8-22 mins  Written Expression  Dominant Hand Right    Roney Marion, PT  Acute Rehabilitation Services Pager [phone] Office [phone]

## 2016-06-15 NOTE — Care Management Important Message (Signed)
Important Message  Patient Details  Name: Juan Wheeler MRN: [id number] Date of Birth: Jul 04, 1961   Medicare Important Message Given:  Yes    Nathen May 06/15/2016, 9:54 AM

## 2016-06-15 NOTE — NC FL2 (Signed)
[place] LEVEL OF CARE SCREENING TOOL     IDENTIFICATION  Patient Name: Juan Wheeler Birthdate: [birth year] Sex: male Admission Date (Current Location): 06/05/2016  [facility] and [place] Number:  [employer] and Address:  [facility] [place], [place] [address] [place], [place] [address]      Provider Number: [id number]  Attending Physician Name and Address:  Domenic Polite, MD  Relative Name and Phone Number:       Current Level of Care: Hospital Recommended Level of Care: [place] Prior Approval Number:    Date Approved/Denied:   PASRR Number: [id number]  Discharge Plan: SNF    Current Diagnoses: Patient Active Problem List   Diagnosis Date Noted  . AKI (acute kidney injury) ([place])   . Fournier's gangrene in male 06/05/2016  . Abscess 06/05/2016  . Sepsis ([place])   . Necrotizing fasciitis ([place])   . Central venous catheter in place   . Acute respiratory failure ([place])   . PAD (peripheral artery disease) ([place]) 07/02/2015  . Preoperative cardiovascular examination 06/12/2015  . Cardiomyopathy, ischemic 06/12/2015  . Pulmonary nodules 09/30/2014  . ASCVD (arteriosclerotic cardiovascular disease) 05/02/2013  . Unstable angina ([place]) 04/18/2013  . Chest pain 03/29/2013  . Tobacco abuse 03/29/2013  . Chronic pain syndrome 11/09/2012  . Pain in joint, pelvic region and thigh 11/09/2012  . Neuralgia and neuritis 10/12/2012  . Atherosclerosis of native arteries of extremity with intermittent claudication ([place]) 08/19/2012  . Backache 03/24/2012  . Peripheral vascular disease ([place]) 12/04/2011  . Compression of vein 12/04/2011  . Heartburn 12/03/2011  . Personal history of digestive disease 11/19/2011  . Depressive disorder 09/24/2011  . Dysuria 08/31/2011  . Impotence of organic origin 08/31/2011  . Urinary frequency 08/31/2011  . Constipation 06/05/2011  . Essential hypertension 06/05/2011  . Anal or rectal pain  06/05/2011  . Malignant neoplasm of rectum ([place]) 12/02/2010    Orientation RESPIRATION BLADDER Height & Weight     Self, Time, Situation, Place  Normal Continent Weight: 79.9 kg (176 lb 2.4 oz) Height:  '5\' 9"'$  (175.3 cm)  BEHAVIORAL SYMPTOMS/MOOD NEUROLOGICAL BOWEL NUTRITION STATUS      Continent, Colostomy Diet (Please see DC Summary)  AMBULATORY STATUS COMMUNICATION OF NEEDS Skin   Limited Assist Verbally Surgical wounds (incision on abdomen, sacrum, buttocks, and groin)                       Personal Care Assistance Level of Assistance  Bathing, Feeding, Dressing Bathing Assistance: Maximum assistance Feeding assistance: Independent Dressing Assistance: Limited assistance     Functional Limitations Info             SPECIAL CARE FACTORS FREQUENCY  PT (By licensed PT)     PT Frequency: 5x/week              Contractures      Additional Factors Info  Code Status, Allergies Code Status Info: Full Allergies Info: Darvocet Propoxyphene N-acetaminophen, Propoxyphene           Current Medications (06/15/2016):  This is the current hospital active medication list Current Facility-Administered Medications  Medication Dose Route Frequency Provider Last Rate Last Dose  . 0.9 %  sodium chloride infusion  250 mL Intravenous PRN Donita Brooks, NP      . acetaminophen (TYLENOL) tablet 650 mg  650 mg Oral Q4H PRN Donita Brooks, NP      . apixaban (ELIQUIS) tablet 5 mg  5 mg Oral BID Ihor Austin, RPH   5 mg at 06/15/16 6773  . famotidine (PEPCID) tablet 20 mg  20 mg Oral BID Domenic Polite, MD   20 mg at 06/15/16 0902  . feeding supplement (GLUCERNA SHAKE) (GLUCERNA SHAKE) liquid 237 mL  237 mL Oral TID BM Jenifer A Williams, RD   237 mL at 06/14/16 2000  . hydrALAZINE (APRESOLINE) injection 10 mg  10 mg Intravenous Q4H PRN Javier Glazier, MD   10 mg at 06/07/16 1220  . isosorbide mononitrate (IMDUR) 24 hr tablet 15 mg  15 mg Oral Daily Jose Shirl Harris, MD    15 mg at 06/15/16 0900  . metoprolol tartrate (LOPRESSOR) tablet 25 mg  25 mg Oral BID Barton Dubois, MD   25 mg at 06/15/16 0903  . morphine 2 MG/ML injection 2-4 mg  2-4 mg Intravenous Q3H PRN Domenic Polite, MD   4 mg at 06/15/16 1125  . ondansetron (ZOFRAN) injection 4 mg  4 mg Intravenous Q6H PRN Donita Brooks, NP      . oxyCODONE (Oxy IR/ROXICODONE) immediate release tablet 5-10 mg  5-10 mg Oral Q4H PRN Greer Pickerel, MD   10 mg at 06/15/16 0855  . piperacillin-tazobactam (ZOSYN) IVPB 3.375 g  3.375 g Intravenous Q8H Aviva Signs, MD   3.375 g at 06/15/16 0536  . sodium chloride flush (NS) 0.9 % injection 10-40 mL  10-40 mL Intracatheter PRN Nita Sells, MD   20 mL at 06/15/16 0829  . vitamin C (ASCORBIC ACID) tablet 500 mg  500 mg Oral Daily Domenic Polite, MD   500 mg at 06/15/16 0900  . zinc sulfate capsule 220 mg  220 mg Oral Daily Domenic Polite, MD   220 mg at 06/15/16 7366     Discharge Medications: Please see discharge summary for a list of discharge medications.  Relevant Imaging Results:  Relevant Lab Results:   Additional Information SSN: [place], [place]

## 2016-06-16 DIAGNOSIS — M729 Fibroblastic disorder, unspecified: Secondary | ICD-10-CM | POA: Diagnosis not present

## 2016-06-16 DIAGNOSIS — L0231 Cutaneous abscess of buttock: Secondary | ICD-10-CM | POA: Diagnosis not present

## 2016-06-16 DIAGNOSIS — L98419 Non-pressure chronic ulcer of buttock with unspecified severity: Secondary | ICD-10-CM | POA: Diagnosis not present

## 2016-06-16 DIAGNOSIS — J9601 Acute respiratory failure with hypoxia: Secondary | ICD-10-CM | POA: Diagnosis not present

## 2016-06-16 DIAGNOSIS — C20 Malignant neoplasm of rectum: Secondary | ICD-10-CM | POA: Diagnosis not present

## 2016-06-16 DIAGNOSIS — T85511A Breakdown (mechanical) of esophageal anti-reflux device, initial encounter: Secondary | ICD-10-CM | POA: Diagnosis not present

## 2016-06-16 DIAGNOSIS — M726 Necrotizing fasciitis: Secondary | ICD-10-CM | POA: Diagnosis not present

## 2016-06-16 DIAGNOSIS — R6520 Severe sepsis without septic shock: Secondary | ICD-10-CM | POA: Diagnosis not present

## 2016-06-16 DIAGNOSIS — M6281 Muscle weakness (generalized): Secondary | ICD-10-CM | POA: Diagnosis not present

## 2016-06-16 DIAGNOSIS — I10 Essential (primary) hypertension: Secondary | ICD-10-CM | POA: Diagnosis not present

## 2016-06-16 DIAGNOSIS — L0291 Cutaneous abscess, unspecified: Secondary | ICD-10-CM | POA: Diagnosis not present

## 2016-06-16 DIAGNOSIS — C801 Malignant (primary) neoplasm, unspecified: Secondary | ICD-10-CM | POA: Diagnosis not present

## 2016-06-16 DIAGNOSIS — K625 Hemorrhage of anus and rectum: Secondary | ICD-10-CM | POA: Diagnosis not present

## 2016-06-16 DIAGNOSIS — I4891 Unspecified atrial fibrillation: Secondary | ICD-10-CM | POA: Diagnosis not present

## 2016-06-16 DIAGNOSIS — G894 Chronic pain syndrome: Secondary | ICD-10-CM | POA: Diagnosis not present

## 2016-06-16 DIAGNOSIS — A419 Sepsis, unspecified organism: Secondary | ICD-10-CM | POA: Diagnosis not present

## 2016-06-16 DIAGNOSIS — R7302 Impaired glucose tolerance (oral): Secondary | ICD-10-CM | POA: Diagnosis not present

## 2016-06-16 DIAGNOSIS — S37009A Unspecified injury of unspecified kidney, initial encounter: Secondary | ICD-10-CM | POA: Diagnosis not present

## 2016-06-16 DIAGNOSIS — E785 Hyperlipidemia, unspecified: Secondary | ICD-10-CM | POA: Diagnosis not present

## 2016-06-16 DIAGNOSIS — K219 Gastro-esophageal reflux disease without esophagitis: Secondary | ICD-10-CM | POA: Diagnosis not present

## 2016-06-16 LAB — CBC
HCT: 34.3 % — ABNORMAL LOW (ref 39.0–52.0)
Hemoglobin: 11.4 g/dL — ABNORMAL LOW (ref 13.0–17.0)
MCH: 31.8 pg (ref 26.0–34.0)
MCHC: 33.2 g/dL (ref 30.0–36.0)
MCV: 95.5 fL (ref 78.0–100.0)
Platelets: 443 10*3/uL — ABNORMAL HIGH (ref 150–400)
RBC: 3.59 MIL/uL — ABNORMAL LOW (ref 4.22–5.81)
RDW: 14.6 % (ref 11.5–15.5)
WBC: 12 10*3/uL — ABNORMAL HIGH (ref 4.0–10.5)

## 2016-06-16 MED ORDER — APIXABAN 5 MG PO TABS: 5.0000 mg | ORAL_TABLET | Freq: Two times a day (BID) | ORAL | Status: DC

## 2016-06-16 MED ORDER — OXYCODONE HCL 5 MG PO TABS: 5.0000 mg | ORAL_TABLET | Freq: Four times a day (QID) | ORAL | 0 refills | Status: DC | PRN

## 2016-06-16 MED ORDER — ASCORBIC ACID 500 MG PO TABS: 500.0000 mg | ORAL_TABLET | Freq: Every day | ORAL | Status: DC

## 2016-06-16 MED ORDER — ZINC SULFATE 220 (50 ZN) MG PO CAPS: 220.0000 mg | ORAL_CAPSULE | Freq: Every day | ORAL | Status: DC

## 2016-06-16 MED ORDER — METOPROLOL TARTRATE 25 MG PO TABS: 25.0000 mg | ORAL_TABLET | Freq: Two times a day (BID) | ORAL | Status: DC

## 2016-06-16 NOTE — Discharge Summary (Signed)
Physician Discharge Summary  Juan Wheeler YYT:[id number] DOB: 1960-12-14 DOA: 06/05/2016  PCP: [name], MD  Admit date: 06/05/2016 Discharge date: 06/16/2016  Time spent: 45 minutes  Recommendations for Outpatient Follow-up:  1-Dr.Hoxworth with [facility] in 2 weeks 2. Kirby Crigler at [facility] in 89month3. GI-for Flex sigmoidoscopy in 1-277month4. Newly started on Oral Eliquis for P.Afib  Discharge Diagnoses:  Active Problems:   Sepsis due to left buttock abscess with fistula to the rectum    history of stage II rectal adeno CA- status post chemotherapy/LAR in 2012   Abscess   [place] ([place]  N[place]izing [place]iitis ([place]  Central venous catheter in place   Acute respiratory failure (HCC)   AKI (acute kidney injury) ([place]  Paroxysmal Afib  Discharge Condition: stable  Diet recommendation: heart healthy  Filed Weights   06/08/16 0400 06/10/16 0543 06/15/16 0657  Weight: 88.6 kg (195 lb 5.2 oz) 83 kg (182 lb 15.7 oz) 79.9 kg (176 lb 2.4 oz)    History of present illness:  55ear old male with history of rectal adenocarcinomatreated with Chemotherapy/XRT and LAR with diverting loop ileostomy and [facility], peripheral arterial disease, DVT no longer on anticoagulation, [facility] Course:  1-Sepsis due to left buttock abscess with fistula to rectum -h/o rectal CA s/p Chemo/XRt and LAR in 2012-[facility] -improved, sepsis physiology resolved -[facility] of L buttock by Dr.[facility]kins, [facility] Dr[facility] :DEBRIDEMENT of skin, subcutaneous tissue, and muscle of left buttock, pulsatile lavage -Next 9/14 per Dr.Hoxworth s/p lap diverting colostomy -per [facility], diet being advanced,  Weaned off TNA 9/17 -treated with IV Zosyn- blood Cx negative and deep wound cx didn't grow any particular organism, clinically improved, getting wound care- -continue dressing changes BID, Colostomy care and Follow up with Dr.Hoxworth -Also needs Follow up Colonoscopy or Flex sigmoidoscopy to re-assess for Rectal CA  2-New diagnosis of PAF: patient A. Fib/A. Flutter noted in ICU on telemetry -rate controlled currently in NSR now -continue metoprolol  -was on IV heparin through this admission, now changed to Eliquis-new med -patient with normal TSH and normal 2-D echo -CHADsVASC score 3  3-Essential HTN -continue imdur and metoprolol  4-AKI: in setting of sepsis  -improved and normal now -on TNA   5-hx of adenocarcinoma of the rectum: [id number]-now complicated in current setting with fistula, needs Onc/GI follow up  6-chronic pain syndrome -continue outpatient pain medication regimen   7-GERD -continue famotidine   8. imparied glucose tolerance -Fu Hbaic 5.9  Procedures:  9/8 -I&D of L buttock by Dr.Jenkins -9/10 -Dr.Wilson :DEBRIDEMENT of skin, subcutaneous tissue, and muscle of left buttock, pulsatile lavage -9/14 -Dr.Hoxworth s/p lap diverting colostomy  Consultations: [facility] GI  Discharge Exam: Vitals:   06/16/16 0941 06/16/16 1422  BP: 134/64 (!) 100/54  Pulse: 63 69  Resp:  18  Temp:  97.5 F (36.4 C)    General: AAOx3 Cardiovascular: S1S2/RRR Respiratory: CTAB  Discharge Instructions   Discharge Instructions    Diet - low sodium heart healthy    Complete by:  As directed    Increase activity slowly    Complete by:  As directed  Current Discharge Medication List    START taking these medications   Details  apixaban (ELIQUIS) 5 MG TABS tablet Take 1 tablet (5 mg total) by mouth 2 (two) times daily.    metoprolol tartrate (LOPRESSOR) 25 MG tablet Take 1 tablet (25 mg total) by mouth 2 (two)  times daily.    vitamin C (VITAMIN C) 500 MG tablet Take 1 tablet (500 mg total) by mouth daily.    zinc sulfate 220 (50 Zn) MG capsule Take 1 capsule (220 mg total) by mouth daily.      CONTINUE these medications which have CHANGED   Details  oxyCODONE (OXY IR/ROXICODONE) 5 MG immediate release tablet Take 1-2 tablets (5-10 mg total) by mouth every 6 (six) hours as needed for moderate pain. Refills: 0      CONTINUE these medications which have NOT CHANGED   Details  nitroGLYCERIN (NITROSTAT) 0.4 MG SL tablet PLACE 1 TABLET UNDER TONGUE IF NEEDED FOR CHEST PAIN..UP TO 3 DOSES Qty: 25 tablet, Refills: 3    isosorbide mononitrate (IMDUR) 30 MG 24 hr tablet TAKE 1/2 TABLET BY MOUTH ONCE DAILY. Qty: 15 tablet, Refills: 3    lovastatin (MEVACOR) 10 MG tablet Take 1 tablet (10 mg total) by mouth at bedtime. Qty: 30 tablet, Refills: 11    omeprazole (PRILOSEC) 40 MG capsule Take 40 mg by mouth daily.      STOP taking these medications     aspirin EC 81 MG tablet      amLODipine (NORVASC) 5 MG tablet      baclofen (LIORESAL) 10 MG tablet      lisinopril (PRINIVIL,ZESTRIL) 40 MG tablet      morphine (MS CONTIN) 60 MG 12 hr tablet      sulfamethoxazole-trimethoprim (BACTRIM DS,SEPTRA DS) 800-160 MG tablet        Allergies  Allergen Reactions  . Darvocet [Propoxyphene N-Acetaminophen] Palpitations  . Propoxyphene Palpitations   Follow-up Information    [name],[name], MD. Schedule an appointment as soon as possible for a visit in 1 week(s).   Specialty:  Internal Medicine Contact information: [place]  [id number] [phone]            The results of significant diagnostics from this hospitalization (including imaging, microbiology, ancillary and laboratory) are listed below for reference.    Significant Diagnostic Studies: Ct Pelvis W Contrast  Result Date: 06/05/2016 CLINICAL DATA:  Left-sided buttock abscess for 1 week EXAM: CT PELVIS  WITH CONTRAST TECHNIQUE: Multidetector CT imaging of the pelvis was performed using the standard protocol following the bolus administration of intravenous contrast. CONTRAST:  128m ISOVUE-300 IOPAMIDOL (ISOVUE-300) INJECTION 61% COMPARISON:  None. FINDINGS: Mild aortoiliac calcifications are identified. The bladder is well distended. No pelvic abnormality is seen. There are changes consistent with significant inflammatory change in the left buttock with a large collection of air which extends deep to the gluteal muscles into the pelvis adjacent to the distal rectum. The air also extends into the substance of the gluteal muscles and into the posterior aspect of the upper thigh in the subcutaneous fat. No demonstrative fluid collection is identified to allow for focal drainage. Diffuse subcutaneous edema is noted consistent with localized cellulitis. IMPRESSION: Changes consistent with significant cellulitis in the left buttock with diffuse irregular air collection medially within the buttock and extending into the gluteal muscles and posterior upper left thigh as well as into the pelvic cavity adjacent to the distal rectum. Greatest transverse dimension is approximately 14 cm. Greatest  craniocaudad dimensions are approximately 12 cm. No definitive fluid component is identified at this time. Electronically Signed   By: Inez Catalina M.D.   On: 06/05/2016 07:58   Dg Chest Port 1 View  Result Date: 06/10/2016 CLINICAL DATA:  Evaluate central line placement, shortness of breath EXAM: PORTABLE CHEST 1 VIEW COMPARISON:  Portable chest x-ray of 06/06/2016 FINDINGS: A right PICC line is now present with the tip seen to the lower SVC above the expected right atrial junction. No pneumothorax is noted. Mild pulmonary vascular congestion cannot be excluded. However, no focal infiltrate or effusion is seen. The heart is within normal limits in size. IMPRESSION: 1. Right PICC line tip is seen to the lower SVC.  No  pneumothorax. 2. Question mild pulmonary vascular congestion. Electronically Signed   By: Ivar Drape M.D.   On: 06/10/2016 10:45   Portable Chest Xray  Result Date: 06/06/2016 CLINICAL DATA:  Acute respiratory failure. EXAM: PORTABLE CHEST 1 VIEW COMPARISON:  Radiograph of June 05, 2016. FINDINGS: Endotracheal and nasogastric tubes are unchanged in position. Right internal jugular catheter is unchanged. No pneumothorax is noted. Mild left basilar subsegmental atelectasis is noted. Stable right basilar opacity is noted concerning for atelectasis or pneumonia with associated pleural effusion. Bony thorax is unremarkable. IMPRESSION: Stable support apparatus. Mild left basilar subsegmental atelectasis. Stable right basilar atelectasis or pneumonia with associated pleural effusion. Electronically Signed   By: Marijo Conception, M.D.   On: 06/06/2016 09:18   Dg Chest Port 1 View  Result Date: 06/05/2016 CLINICAL DATA:  Acute respiratory failure. EXAM: PORTABLE CHEST 1 VIEW COMPARISON:  Chest radiograph June 05, 2016 at 1123 hours FINDINGS: Patient rotated to the RIGHT. Lung bases incompletely imaged. Endotracheal tube tip projects 4.3 cm above the carina. RIGHT internal jugular central venous catheter projecting cavoatrial junction. Nasogastric tube past distal esophagus, distal tip not imaged. Worsening RIGHT lung volume loss with consolidation and at least small pleural effusion. LEFT lung base strandy densities consistent with atelectasis. Diffuse mild interstitial prominence. Cardiac silhouette is normal in size. No pneumothorax. Soft tissue planes and included osseous structures are unchanged. IMPRESSION: Worsening RIGHT lung consolidation and atelectasis and small pleural effusion concerning for underlying pneumonia. Increasing interstitial prominence, possibly infectious. Stable appearance of life-support lines. Electronically Signed   By: Elon Alas M.D.   On: 06/05/2016 15:25   Dg Chest  Port 1 View  Result Date: 06/05/2016 CLINICAL DATA:  Central line placement. EXAM: PORTABLE CHEST 1 VIEW COMPARISON:  Radiograph of same day. FINDINGS: Stable cardiomediastinal silhouette. Endotracheal tube is seen over tracheal air shadow with distal tip 5 cm above the carina. Nasogastric tube is noted, although distal tip is not well visualized. Interval placement of right internal jugular catheter with distal tip in expected position of cavoatrial junction. Mild central pulmonary vascular congestion is noted with probable left perihilar edema. Significantly increased opacities are noted a right midlung area concerning for atelectasis or possibly inflammation. No pneumothorax is noted. IMPRESSION: Endotracheal tube in grossly good position. Interval placement of right internal jugular catheter with distal tip in grossly good position. No pneumothorax is noted. Nasogastric tube is noted as well, but distal tip is not well visualized on this study. Probable mild left perihilar edema is noted. Interval development of significant right midlung opacities are noted concerning for atelectasis or possibly pneumonia. Electronically Signed   By: Marijo Conception, M.D.   On: 06/05/2016 11:39   Dg Chest Portable 1 View  Result Date: 06/05/2016 CLINICAL  DATA:  Hypoxia, low BP now, hypertension, history of rectal and colon cancer. Smoker 1/4 pack per day EXAM: PORTABLE CHEST 1 VIEW COMPARISON:  CT thorax 12/25/2015, chest x-ray 03/28/2013 FINDINGS: The patient is rotated. There are streaky bibasilar left greater than right opacities, likely reflecting atelectasis. Cardiomediastinal silhouette nonenlarged allowing for rotation. Pulmonary vascularity is upper normal. No overt edema. No pneumothorax. IMPRESSION: 1. Mild streaky bibasilar opacities likely reflecting atelectasis. Electronically Signed   By: Donavan Foil M.D.   On: 06/05/2016 08:44    Microbiology: Recent Results (from the past 240 hour(s))  MRSA PCR  Screening     Status: None   Collection Time: 06/09/16 11:31 AM  Result Value Ref Range Status   MRSA by PCR NEGATIVE NEGATIVE Final    Comment:        The GeneXpert MRSA Assay (FDA approved for NASAL specimens only), is one component of a comprehensive MRSA colonization surveillance program. It is not intended to diagnose MRSA infection nor to guide or monitor treatment for MRSA infections.   Surgical pcr screen     Status: None   Collection Time: 06/11/16  5:11 AM  Result Value Ref Range Status   MRSA, PCR NEGATIVE NEGATIVE Final   Staphylococcus aureus NEGATIVE NEGATIVE Final    Comment:        The Xpert SA Assay (FDA approved for NASAL specimens in patients over 65 years of age), is one component of a comprehensive surveillance program.  Test performance has been validated by [facility] for patients greater than or equal to 27 year old. It is not intended to diagnose infection nor to guide or monitor treatment.      Labs: Basic Metabolic Panel:  Recent Labs Lab 06/10/16 0626 06/11/16 0500 06/13/16 0540 06/15/16 0500  NA 137 139 140 138  K 4.1 3.6 4.1 4.1  CL 105 106 109 105  CO2 '23 26 26 26  '$ GLUCOSE 107* 120* 125* 103*  BUN '7 10 13 11  '$ CREATININE 1.11 1.11 0.99 1.24  CALCIUM 8.7* 8.3* 8.2* 8.7*  MG 2.0 2.1  --   --   PHOS 3.3 3.4  --   --    Liver Function Tests:  Recent Labs Lab 06/10/16 0626 06/11/16 0500 06/13/16 0540  AST 39 33 24  ALT 39 37 30  ALKPHOS 55 51 46  BILITOT 1.2 1.2 0.4  PROT 6.3* 5.7* 5.6*  ALBUMIN 2.3* 2.1* 2.0*   No results for input(s): LIPASE, AMYLASE in the last 168 hours. No results for input(s): AMMONIA in the last 168 hours. CBC:  Recent Labs Lab 06/10/16 0626  06/12/16 0459 06/13/16 0540 06/14/16 1135 06/15/16 0500 06/16/16 0415  WBC 12.4*  < > 12.7* 13.3* 14.6* 12.3* 12.0*  NEUTROABS 9.3*  --   --  10.3*  --   --   --   HGB 14.5  < > 11.1* 10.7* 11.5* 11.6* 11.4*  HCT 42.4  < > 33.8* 32.5* 35.0* 34.6*  34.3*  MCV 94.2  < > 95.5 95.6 96.4 95.3 95.5  PLT 356  < > 342 378 464* 449* 443*  < > = values in this interval not displayed. Cardiac Enzymes: No results for input(s): CKTOTAL, CKMB, CKMBINDEX, TROPONINI in the last 168 hours. BNP: BNP (last 3 results) No results for input(s): BNP in the last 8760 hours.  ProBNP (last 3 results) No results for input(s): PROBNP in the last 8760 hours.  CBG:  Recent Labs Lab 06/12/16 2344 06/13/16 0523 06/13/16  1807 06/14/16 0009 06/14/16 0707  GLUCAP 114* 135* 130* 103* 125*       SignedDomenic Polite MD.  [facility] 06/16/2016, 3:03 PM

## 2016-06-16 NOTE — Clinical Social Work Placement (Signed)
   CLINICAL SOCIAL WORK PLACEMENT  NOTE  Date:  06/16/2016  Patient Details  Name: Juan Wheeler MRN: [id number] Date of Birth: 09/20/1961  Clinical Social Work is seeking post-discharge placement for this patient at the [place] level of care (*CSW will initial, date and re-position this form in  chart as items are completed):  Yes   Patient/family provided with [place] Work Department's list of facilities offering this level of care within the geographic area requested by the patient (or if unable, by the patient's family).  Yes   Patient/family informed of their freedom to choose among providers that offer the needed level of care, that participate in Medicare, Medicaid or managed care program needed by the patient, have an available bed and are willing to accept the patient.  Yes   Patient/family informed of [place]'s ownership interest in [facility], as well as of the fact that they are under no obligation to receive care at these facilities.  PASRR submitted to EDS on 06/15/16     PASRR number received on 06/15/16     Existing PASRR number confirmed on       FL2 transmitted to all facilities in geographic area requested by pt/family on 06/15/16     FL2 transmitted to all facilities within larger geographic area on       Patient informed that his/her managed care company has contracts with or will negotiate with certain facilities, including the following:        Yes   Patient/family informed of bed offers received.  Patient chooses bed at [place] at [facility]     Physician recommends and patient chooses bed at      Patient to be transferred to [facility] at [place] on 06/16/16.  Patient to be transferred to facility by [facility]     Patient family notified on 06/16/16 of transfer.  Name of family member notified:  N/A     PHYSICIAN Please sign FL2     Additional Comment:     _______________________________________________ Benard Halsted, [place] 06/16/2016, 3:55 PM

## 2016-06-16 NOTE — Consult Note (Signed)
   [facility] CM Inpatient Consult   06/16/2016  Juan Wheeler [birth year] [id number]    Patient screened for potential [place] Management services. Patient is eligible for [facility] Care Management services under patient's [facility] plan. Chart review reveals the patient is a21 year old male with history of rectal adenocarcinomatreated with Chemotherapy/XRT and LAR with diverting loop ileostomy and then subsequent ileostomy closure in2012 at [facility], CAD status post stent, peripheral arterial disease, DVT no longer on anticoagulation, hypertension admitted from [facility] 9/8 with a left buttock abscess status post surgical I&D.  Admitted to [facility] health with severe sepsis.  Patient's current plan is for a skilled nursing facility stay and no immediate community follow up needed at this time.. For questions contact:   Natividad Brood, RN BSN [place] [facility] Liaison  [phone] business mobile phone Toll free office [phone]

## 2016-06-16 NOTE — Progress Notes (Signed)
[facility] Progress Note  5 Days Post-Op  Subjective: crampy abdominal pain this AM that has since resolved. Tolerating PO without N/V. Ambulating with PT.  Objective: Vital signs in last 24 hours: Temp:  [98.2 F (36.8 C)-99.3 F (37.4 C)] 98.3 F (36.8 C) (09/19 0534) Pulse Rate:  [57-70] 63 (09/19 0941) Resp:  [18] 18 (09/19 0534) BP: (115-147)/(64-71) 134/64 (09/19 0941) SpO2:  [93 %-94 %] 94 % (09/19 0534) Last BM Date: 06/16/16  Intake/Output from previous day: 09/18 0701 - 09/19 0700 In: 350 [P.O.:230; I.V.:20; IV Piggyback:100] Out: 1550 [Urine:1550] Intake/Output this shift: Total I/O In: 100 [IV Piggyback:100] Out: -   PE: Gen:  Alert, NAD, pleasant Abd: Soft, NT/ND, +BS, ostomy viable with gas and stool in pouch  Lab Results:   Recent Labs  06/15/16 0500 06/16/16 0415  WBC 12.3* 12.0*  HGB 11.6* 11.4*  HCT 34.6* 34.3*  PLT 449* 443*   BMET  Recent Labs  06/15/16 0500  NA 138  K 4.1  CL 105  CO2 26  GLUCOSE 103*  BUN 11  CREATININE 1.24  CALCIUM 8.7*   PT/INR No results for input(s): LABPROT, INR in the last 72 hours. CMP     Component Value Date/Time   NA 138 06/15/2016 0500   K 4.1 06/15/2016 0500   CL 105 06/15/2016 0500   CO2 26 06/15/2016 0500   GLUCOSE 103 (H) 06/15/2016 0500   BUN 11 06/15/2016 0500   CREATININE 1.24 06/15/2016 0500   CREATININE 1.10 04/14/2013 1617   CALCIUM 8.7 (L) 06/15/2016 0500   PROT 5.6 (L) 06/13/2016 0540   ALBUMIN 2.0 (L) 06/13/2016 0540   AST 24 06/13/2016 0540   ALT 30 06/13/2016 0540   ALKPHOS 46 06/13/2016 0540   BILITOT 0.4 06/13/2016 0540   GFRNONAA >60 06/15/2016 0500   GFRAA >60 06/15/2016 0500   Lipase  No results found for: LIPASE     Studies/Results: No results found.  Anti-infectives: Anti-infectives    Start     Dose/Rate Route Frequency Ordered Stop   06/05/16 2200  piperacillin-tazobactam (ZOSYN) IVPB 3.375 g     3.375 g 12.5 mL/hr over 240 Minutes  Intravenous Every 8 hours 06/05/16 1602     06/05/16 1800  clindamycin (CLEOCIN) IVPB 600 mg  Status:  Discontinued     600 mg 100 mL/hr over 30 Minutes Intravenous Every 6 hours 06/05/16 1453 06/08/16 1155   06/05/16 1630  vancomycin (VANCOCIN) IVPB 750 mg/150 ml premix  Status:  Discontinued     750 mg 150 mL/hr over 60 Minutes Intravenous Every 12 hours 06/05/16 1605 06/08/16 1155   06/05/16 1615  piperacillin-tazobactam (ZOSYN) IVPB 3.375 g     3.375 g 100 mL/hr over 30 Minutes Intravenous  Once 06/05/16 1601 06/05/16 1744   06/05/16 1400  piperacillin-tazobactam (ZOSYN) IVPB 3.375 g  Status:  Discontinued     3.375 g 12.5 mL/hr over 240 Minutes Intravenous Every 8 hours 06/05/16 0937 06/05/16 1553   06/05/16 0800  clindamycin (CLEOCIN) IVPB 600 mg     600 mg 100 mL/hr over 30 Minutes Intravenous  Once 06/05/16 0759 06/05/16 0846   06/05/16 0645  vancomycin (VANCOCIN) IVPB 1000 mg/200 mL premix     1,000 mg 200 mL/hr over 60 Minutes Intravenous  Once 06/05/16 0637 06/05/16 0846   06/05/16 0645  piperacillin-tazobactam (ZOSYN) IVPB 3.375 g     3.375 g 12.5 mL/hr over 240 Minutes Intravenous  Once 06/05/16 7846 06/05/16 0825  Assessment/Plan s/p Procedure(s): LAPAROSCOPIC  OPEN COLOSTOMY 9/14 Rectal CA with fistula formation  Doing well, tolerating carb modified diet Receiving wound care to buttock - appreciate WOC seeing.   ID- Zosyn. will d/c today  Will go to SNF at discharge    LOS: 11 days    Jill Alexanders , [facility] 06/16/2016, 10:20 AM Pager: [phone] Consults: [phone] Mon-Fri 7:00 am-4:30 pm Sat-Sun 7:00 am-11:30 am

## 2016-06-16 NOTE — Progress Notes (Signed)
Patient will DC to: [facility] Anticipated DC date: 06/16/16 Family notified: Patient calling wife Transport by: Corey Harold   Per MD patient ready for DC to [facility]. RN, patient, patient's family, and facility notified of DC. Discharge Summary sent to facility. RN given number for report. DC packet on chart. Ambulance transport requested for patient.   CSW signing off.  Cedric Fishman, [place] Social Worker [phone]

## 2016-06-16 NOTE — Progress Notes (Signed)
Juan Wheeler to be D/C'd to [facility] skilled nursing facility per MD order.  Discussed with the patient and all questions fully answered.  VSS, Skin clean, dry and intact without evidence of skin break down, no evidence of skin tears noted. IV catheter discontinued intact. Site without signs and symptoms of complications. Dressing and pressure applied.  An After Visit Summary was printed and given to the patient. PTAR received prescription.  D/c education completed with patient/family including follow up instructions, medication list, d/c activities limitations if indicated, with other d/c instructions as indicated by MD - patient able to verbalize understanding, all questions fully answered.   Patient instructed to return to ED, call 911, or call MD for any changes in condition.   Patient escorted via stretcher, and D/C to SNF via [facility].  Juan Wheeler 06/16/2016 6:52 PM

## 2016-06-23 DIAGNOSIS — L98419 Non-pressure chronic ulcer of buttock with unspecified severity: Secondary | ICD-10-CM | POA: Diagnosis not present

## 2016-06-26 ENCOUNTER — Other Ambulatory Visit (HOSPITAL_COMMUNITY): Payer: Commercial Managed Care - HMO

## 2016-06-26 NOTE — Telephone Encounter (Signed)
ERROR

## 2016-07-01 DIAGNOSIS — C20 Malignant neoplasm of rectum: Secondary | ICD-10-CM | POA: Diagnosis not present

## 2016-07-01 DIAGNOSIS — L0231 Cutaneous abscess of buttock: Secondary | ICD-10-CM | POA: Diagnosis not present

## 2016-07-01 DIAGNOSIS — A419 Sepsis, unspecified organism: Secondary | ICD-10-CM | POA: Diagnosis not present

## 2016-07-01 DIAGNOSIS — I4891 Unspecified atrial fibrillation: Secondary | ICD-10-CM | POA: Diagnosis not present

## 2016-07-01 DIAGNOSIS — L98419 Non-pressure chronic ulcer of buttock with unspecified severity: Secondary | ICD-10-CM | POA: Diagnosis not present

## 2016-07-03 ENCOUNTER — Ambulatory Visit (HOSPITAL_COMMUNITY): Payer: Commercial Managed Care - HMO | Admitting: Oncology

## 2016-07-05 DIAGNOSIS — M6281 Muscle weakness (generalized): Secondary | ICD-10-CM | POA: Diagnosis not present

## 2016-07-05 DIAGNOSIS — I2510 Atherosclerotic heart disease of native coronary artery without angina pectoris: Secondary | ICD-10-CM | POA: Diagnosis not present

## 2016-07-05 DIAGNOSIS — B9689 Other specified bacterial agents as the cause of diseases classified elsewhere: Secondary | ICD-10-CM | POA: Diagnosis not present

## 2016-07-05 DIAGNOSIS — C20 Malignant neoplasm of rectum: Secondary | ICD-10-CM | POA: Diagnosis not present

## 2016-07-05 DIAGNOSIS — K611 Rectal abscess: Secondary | ICD-10-CM | POA: Diagnosis not present

## 2016-07-05 DIAGNOSIS — M726 Necrotizing fasciitis: Secondary | ICD-10-CM | POA: Diagnosis not present

## 2016-07-05 DIAGNOSIS — Z433 Encounter for attention to colostomy: Secondary | ICD-10-CM | POA: Diagnosis not present

## 2016-07-05 DIAGNOSIS — G894 Chronic pain syndrome: Secondary | ICD-10-CM | POA: Diagnosis not present

## 2016-07-05 DIAGNOSIS — I480 Paroxysmal atrial fibrillation: Secondary | ICD-10-CM | POA: Diagnosis not present

## 2016-07-06 DIAGNOSIS — I10 Essential (primary) hypertension: Secondary | ICD-10-CM | POA: Diagnosis not present

## 2016-07-06 DIAGNOSIS — S31809D Unspecified open wound of unspecified buttock, subsequent encounter: Secondary | ICD-10-CM | POA: Diagnosis not present

## 2016-07-06 DIAGNOSIS — Z933 Colostomy status: Secondary | ICD-10-CM | POA: Diagnosis not present

## 2016-07-07 DIAGNOSIS — G894 Chronic pain syndrome: Secondary | ICD-10-CM | POA: Diagnosis not present

## 2016-07-07 DIAGNOSIS — M726 Necrotizing fasciitis: Secondary | ICD-10-CM | POA: Diagnosis not present

## 2016-07-07 DIAGNOSIS — I2510 Atherosclerotic heart disease of native coronary artery without angina pectoris: Secondary | ICD-10-CM | POA: Diagnosis not present

## 2016-07-07 DIAGNOSIS — B9689 Other specified bacterial agents as the cause of diseases classified elsewhere: Secondary | ICD-10-CM | POA: Diagnosis not present

## 2016-07-07 DIAGNOSIS — C20 Malignant neoplasm of rectum: Secondary | ICD-10-CM | POA: Diagnosis not present

## 2016-07-07 DIAGNOSIS — M6281 Muscle weakness (generalized): Secondary | ICD-10-CM | POA: Diagnosis not present

## 2016-07-07 DIAGNOSIS — Z433 Encounter for attention to colostomy: Secondary | ICD-10-CM | POA: Diagnosis not present

## 2016-07-07 DIAGNOSIS — I480 Paroxysmal atrial fibrillation: Secondary | ICD-10-CM | POA: Diagnosis not present

## 2016-07-07 DIAGNOSIS — K611 Rectal abscess: Secondary | ICD-10-CM | POA: Diagnosis not present

## 2016-07-08 ENCOUNTER — Other Ambulatory Visit: Payer: Self-pay

## 2016-07-08 ENCOUNTER — Other Ambulatory Visit (HOSPITAL_COMMUNITY): Payer: Self-pay

## 2016-07-08 ENCOUNTER — Inpatient Hospital Stay (HOSPITAL_COMMUNITY)
Admission: EM | Admit: 2016-07-08 | Discharge: 2016-07-12 | DRG: 872 | Disposition: A | Discharge status: Home / Self Care | Payer: Commercial Managed Care - HMO | Attending: Internal Medicine | Admitting: Internal Medicine

## 2016-07-08 ENCOUNTER — Emergency Department (HOSPITAL_COMMUNITY): Payer: Commercial Managed Care - HMO

## 2016-07-08 ENCOUNTER — Encounter (HOSPITAL_COMMUNITY): Payer: Self-pay | Admitting: *Deleted

## 2016-07-08 DIAGNOSIS — R000 Tachycardia, unspecified: Secondary | ICD-10-CM | POA: Diagnosis present

## 2016-07-08 DIAGNOSIS — R0602 Shortness of breath: Secondary | ICD-10-CM | POA: Diagnosis not present

## 2016-07-08 DIAGNOSIS — Z86718 Personal history of other venous thrombosis and embolism: Secondary | ICD-10-CM

## 2016-07-08 DIAGNOSIS — I739 Peripheral vascular disease, unspecified: Secondary | ICD-10-CM | POA: Diagnosis present

## 2016-07-08 DIAGNOSIS — G8929 Other chronic pain: Secondary | ICD-10-CM | POA: Diagnosis present

## 2016-07-08 DIAGNOSIS — I480 Paroxysmal atrial fibrillation: Secondary | ICD-10-CM

## 2016-07-08 DIAGNOSIS — Z955 Presence of coronary angioplasty implant and graft: Secondary | ICD-10-CM

## 2016-07-08 DIAGNOSIS — I25119 Atherosclerotic heart disease of native coronary artery with unspecified angina pectoris: Secondary | ICD-10-CM

## 2016-07-08 DIAGNOSIS — K219 Gastro-esophageal reflux disease without esophagitis: Secondary | ICD-10-CM | POA: Diagnosis not present

## 2016-07-08 DIAGNOSIS — E876 Hypokalemia: Secondary | ICD-10-CM

## 2016-07-08 DIAGNOSIS — Z85048 Personal history of other malignant neoplasm of rectum, rectosigmoid junction, and anus: Secondary | ICD-10-CM | POA: Diagnosis not present

## 2016-07-08 DIAGNOSIS — Z9049 Acquired absence of other specified parts of digestive tract: Secondary | ICD-10-CM

## 2016-07-08 DIAGNOSIS — I2510 Atherosclerotic heart disease of native coronary artery without angina pectoris: Secondary | ICD-10-CM | POA: Diagnosis not present

## 2016-07-08 DIAGNOSIS — Z832 Family history of diseases of the blood and blood-forming organs and certain disorders involving the immune mechanism: Secondary | ICD-10-CM | POA: Diagnosis not present

## 2016-07-08 DIAGNOSIS — I10 Essential (primary) hypertension: Secondary | ICD-10-CM | POA: Diagnosis present

## 2016-07-08 DIAGNOSIS — Z8249 Family history of ischemic heart disease and other diseases of the circulatory system: Secondary | ICD-10-CM | POA: Diagnosis not present

## 2016-07-08 DIAGNOSIS — Z888 Allergy status to other drugs, medicaments and biological substances status: Secondary | ICD-10-CM

## 2016-07-08 DIAGNOSIS — R0682 Tachypnea, not elsewhere classified: Secondary | ICD-10-CM | POA: Diagnosis present

## 2016-07-08 DIAGNOSIS — Z79899 Other long term (current) drug therapy: Secondary | ICD-10-CM | POA: Diagnosis not present

## 2016-07-08 DIAGNOSIS — Z809 Family history of malignant neoplasm, unspecified: Secondary | ICD-10-CM | POA: Diagnosis not present

## 2016-07-08 DIAGNOSIS — A419 Sepsis, unspecified organism: Principal | ICD-10-CM

## 2016-07-08 DIAGNOSIS — C20 Malignant neoplasm of rectum: Secondary | ICD-10-CM | POA: Diagnosis present

## 2016-07-08 DIAGNOSIS — N390 Urinary tract infection, site not specified: Secondary | ICD-10-CM | POA: Diagnosis not present

## 2016-07-08 DIAGNOSIS — M545 Low back pain: Secondary | ICD-10-CM | POA: Diagnosis present

## 2016-07-08 DIAGNOSIS — Z8711 Personal history of peptic ulcer disease: Secondary | ICD-10-CM

## 2016-07-08 DIAGNOSIS — N493 Fournier gangrene: Secondary | ICD-10-CM | POA: Diagnosis present

## 2016-07-08 DIAGNOSIS — R001 Bradycardia, unspecified: Secondary | ICD-10-CM | POA: Diagnosis not present

## 2016-07-08 LAB — COMPREHENSIVE METABOLIC PANEL
ALT: 29 U/L (ref 17–63)
AST: 25 U/L (ref 15–41)
Albumin: 3.5 g/dL (ref 3.5–5.0)
Alkaline Phosphatase: 96 U/L (ref 38–126)
Anion gap: 10 (ref 5–15)
BUN: 6 mg/dL (ref 6–20)
CO2: 26 mmol/L (ref 22–32)
Calcium: 8.9 mg/dL (ref 8.9–10.3)
Chloride: 103 mmol/L (ref 101–111)
Creatinine, Ser: 1.25 mg/dL — ABNORMAL HIGH (ref 0.61–1.24)
GFR calc Af Amer: >60 mL/min (ref >60)
GFR calc non Af Amer: >60 mL/min (ref >60)
Glucose, Bld: 136 mg/dL — ABNORMAL HIGH (ref 65–99)
Potassium: 3.3 mmol/L — ABNORMAL LOW (ref 3.5–5.1)
Sodium: 139 mmol/L (ref 135–145)
Total Bilirubin: 0.6 mg/dL (ref 0.3–1.2)
Total Protein: 8.3 g/dL — ABNORMAL HIGH (ref 6.5–8.1)

## 2016-07-08 LAB — URINALYSIS, ROUTINE W REFLEX MICROSCOPIC
Bilirubin Urine: NEGATIVE
Glucose, UA: NEGATIVE mg/dL
Ketones, ur: NEGATIVE mg/dL
Nitrite: NEGATIVE
Protein, ur: NEGATIVE mg/dL
Specific Gravity, Urine: 1.01 (ref 1.005–1.030)
pH: 6.5 (ref 5.0–8.0)

## 2016-07-08 LAB — APTT: aPTT: 44 seconds — ABNORMAL HIGH (ref 24–36)

## 2016-07-08 LAB — CBC WITH DIFFERENTIAL/PLATELET
Basophils Absolute: 0 10*3/uL (ref 0.0–0.1)
Basophils Relative: 0 %
Eosinophils Absolute: 0.3 10*3/uL (ref 0.0–0.7)
Eosinophils Relative: 2 %
HCT: 41.2 % (ref 39.0–52.0)
Hemoglobin: 13.9 g/dL (ref 13.0–17.0)
Lymphocytes Relative: 12 %
Lymphs Abs: 1.4 10*3/uL (ref 0.7–4.0)
MCH: 32 pg (ref 26.0–34.0)
MCHC: 33.7 g/dL (ref 30.0–36.0)
MCV: 94.9 fL (ref 78.0–100.0)
Monocytes Absolute: 0.2 10*3/uL (ref 0.1–1.0)
Monocytes Relative: 2 %
Neutro Abs: 10.2 10*3/uL — ABNORMAL HIGH (ref 1.7–7.7)
Neutrophils Relative %: 84 %
Platelets: 251 10*3/uL (ref 150–400)
RBC: 4.34 MIL/uL (ref 4.22–5.81)
RDW: 14 % (ref 11.5–15.5)
WBC: 12.1 10*3/uL — ABNORMAL HIGH (ref 4.0–10.5)

## 2016-07-08 LAB — PROTIME-INR
INR: 1.13
Prothrombin Time: 14.6 seconds (ref 11.4–15.2)

## 2016-07-08 LAB — BRAIN NATRIURETIC PEPTIDE: B Natriuretic Peptide: 167 pg/mL — ABNORMAL HIGH (ref 0.0–100.0)

## 2016-07-08 LAB — I-STAT CG4 LACTIC ACID, ED: Lactic Acid, Venous: 4.67 mmol/L (ref 0.5–1.9)

## 2016-07-08 LAB — PROCALCITONIN: Procalcitonin: 0.93 ng/mL

## 2016-07-08 LAB — URINE MICROSCOPIC-ADD ON

## 2016-07-08 LAB — LACTIC ACID, PLASMA
Lactic Acid, Venous: 1.8 mmol/L (ref 0.5–1.9)
Lactic Acid, Venous: 1.7 mmol/L (ref 0.5–1.9)

## 2016-07-08 LAB — MAGNESIUM: Magnesium: 1.4 mg/dL — ABNORMAL LOW (ref 1.7–2.4)

## 2016-07-08 LAB — CBG MONITORING, ED: Glucose-Capillary: 111 mg/dL — ABNORMAL HIGH (ref 65–99)

## 2016-07-08 LAB — TROPONIN I: Troponin I: <0.03 ng/mL (ref <0.03)

## 2016-07-08 MED ORDER — ISOSORBIDE MONONITRATE ER 30 MG PO TB24
15.0000 mg | ORAL_TABLET | Freq: Every day | ORAL | Status: DC
  Administered 2016-07-08 – 2016-07-12 (×5): 15 mg via ORAL
  Filled 2016-07-08 (×7): qty 1

## 2016-07-08 MED ORDER — ACETAMINOPHEN 325 MG PO TABS: 650.0000 mg | ORAL_TABLET | Freq: Four times a day (QID) | ORAL | Status: DC | PRN

## 2016-07-08 MED ORDER — PRAVASTATIN SODIUM 10 MG PO TABS
10.0000 mg | ORAL_TABLET | Freq: Every day | ORAL | Status: DC
  Administered 2016-07-08 – 2016-07-11 (×4): 10 mg via ORAL
  Filled 2016-07-08 (×6): qty 1

## 2016-07-08 MED ORDER — APIXABAN 5 MG PO TABS
5.0000 mg | ORAL_TABLET | Freq: Two times a day (BID) | ORAL | Status: DC
  Administered 2016-07-08 – 2016-07-12 (×9): 5 mg via ORAL
  Filled 2016-07-08 (×13): qty 1

## 2016-07-08 MED ORDER — SODIUM CHLORIDE 0.9 % IV SOLN
INTRAVENOUS | Status: AC
  Administered 2016-07-08: 06:00:00 via INTRAVENOUS

## 2016-07-08 MED ORDER — IPRATROPIUM-ALBUTEROL 0.5-2.5 (3) MG/3ML IN SOLN
3.0000 mL | Freq: Once | RESPIRATORY_TRACT | Status: AC
  Administered 2016-07-08: 3 mL via RESPIRATORY_TRACT
  Filled 2016-07-08: qty 3

## 2016-07-08 MED ORDER — IPRATROPIUM-ALBUTEROL 0.5-2.5 (3) MG/3ML IN SOLN: 3.0000 mL | RESPIRATORY_TRACT | Status: DC | PRN

## 2016-07-08 MED ORDER — POTASSIUM CHLORIDE CRYS ER 20 MEQ PO TBCR
20.0000 meq | EXTENDED_RELEASE_TABLET | Freq: Once | ORAL | Status: AC
  Administered 2016-07-08: 20 meq via ORAL
  Filled 2016-07-08: qty 1

## 2016-07-08 MED ORDER — SODIUM CHLORIDE 0.9% FLUSH
3.0000 mL | Freq: Two times a day (BID) | INTRAVENOUS | Status: DC
  Administered 2016-07-08 – 2016-07-12 (×9): 3 mL via INTRAVENOUS

## 2016-07-08 MED ORDER — ZINC SULFATE 220 (50 ZN) MG PO CAPS
220.0000 mg | ORAL_CAPSULE | Freq: Every day | ORAL | Status: DC
  Administered 2016-07-08 – 2016-07-12 (×5): 220 mg via ORAL
  Filled 2016-07-08 (×7): qty 1

## 2016-07-08 MED ORDER — PANTOPRAZOLE SODIUM 40 MG PO TBEC
40.0000 mg | DELAYED_RELEASE_TABLET | Freq: Every day | ORAL | Status: DC
  Administered 2016-07-08 – 2016-07-12 (×5): 40 mg via ORAL
  Filled 2016-07-08 (×5): qty 1

## 2016-07-08 MED ORDER — ACETAMINOPHEN 650 MG RE SUPP: 650.0000 mg | Freq: Four times a day (QID) | RECTAL | Status: DC | PRN

## 2016-07-08 MED ORDER — ONDANSETRON HCL 4 MG/2ML IJ SOLN: 4.0000 mg | Freq: Four times a day (QID) | INTRAMUSCULAR | Status: DC | PRN

## 2016-07-08 MED ORDER — DEXTROSE 5 % IV SOLN
2.0000 g | Freq: Once | INTRAVENOUS | Status: AC
  Administered 2016-07-08: 2 g via INTRAVENOUS
  Filled 2016-07-08: qty 2

## 2016-07-08 MED ORDER — OXYCODONE HCL 5 MG PO TABS
5.0000 mg | ORAL_TABLET | Freq: Four times a day (QID) | ORAL | Status: DC | PRN
  Administered 2016-07-08 – 2016-07-12 (×15): 10 mg via ORAL
  Filled 2016-07-08 (×16): qty 2

## 2016-07-08 MED ORDER — VANCOMYCIN HCL IN DEXTROSE 1-5 GM/200ML-% IV SOLN
1000.0000 mg | Freq: Once | INTRAVENOUS | Status: AC
  Administered 2016-07-08: 1000 mg via INTRAVENOUS
  Filled 2016-07-08: qty 200

## 2016-07-08 MED ORDER — ACETAMINOPHEN 325 MG PO TABS
650.0000 mg | ORAL_TABLET | Freq: Once | ORAL | Status: AC
  Administered 2016-07-08: 650 mg via ORAL
  Filled 2016-07-08: qty 2

## 2016-07-08 MED ORDER — ONDANSETRON HCL 4 MG PO TABS: 4.0000 mg | ORAL_TABLET | Freq: Four times a day (QID) | ORAL | Status: DC | PRN

## 2016-07-08 MED ORDER — POTASSIUM CHLORIDE 10 MEQ/100ML IV SOLN
10.0000 meq | Freq: Once | INTRAVENOUS | Status: AC
  Administered 2016-07-08: 10 meq via INTRAVENOUS
  Filled 2016-07-08: qty 100

## 2016-07-08 MED ORDER — PIPERACILLIN-TAZOBACTAM 3.375 G IVPB 30 MIN
3.3750 g | Freq: Once | INTRAVENOUS | Status: AC
  Administered 2016-07-08: 3.375 g via INTRAVENOUS
  Filled 2016-07-08: qty 50

## 2016-07-08 MED ORDER — METOPROLOL TARTRATE 25 MG PO TABS
25.0000 mg | ORAL_TABLET | Freq: Two times a day (BID) | ORAL | Status: DC
  Administered 2016-07-08 – 2016-07-12 (×9): 25 mg via ORAL
  Filled 2016-07-08 (×9): qty 1

## 2016-07-08 MED ORDER — VITAMIN C 500 MG PO TABS
500.0000 mg | ORAL_TABLET | Freq: Every day | ORAL | Status: DC
  Administered 2016-07-08 – 2016-07-12 (×5): 500 mg via ORAL
  Filled 2016-07-08 (×7): qty 1

## 2016-07-08 MED ORDER — DEXTROSE 5 % IV SOLN
1.0000 g | Freq: Three times a day (TID) | INTRAVENOUS | Status: DC
  Administered 2016-07-08 – 2016-07-12 (×12): 1 g via INTRAVENOUS
  Filled 2016-07-08 (×16): qty 1

## 2016-07-08 MED ORDER — SODIUM CHLORIDE 0.9 % IV BOLUS (SEPSIS)
2500.0000 mL | Freq: Once | INTRAVENOUS | Status: AC
  Administered 2016-07-08: 2500 mL via INTRAVENOUS

## 2016-07-08 NOTE — Progress Notes (Signed)
Pharmacy Antibiotic Note  Juan Wheeler is a 55 y.o. male admitted on 07/08/2016 with UTI / sepsis.   Pharmacy has been consulted for CEFEPIME dosing.  Plan: Cefepime 1gm IV q8h Monitor labs, progress, c/s  Height: '5\' 9"'$  (175.3 cm) Weight: 172 lb (78 kg) IBW/kg (Calculated) : 70.7  Temp (24hrs), Avg:99.2 F (37.3 C), Min:97.8 F (36.6 C), Max:101 F (38.3 C)   Recent Labs Lab 07/08/16 0238 07/08/16 0300 07/08/16 0524 07/08/16 0856  WBC 12.1*  --   --   --   CREATININE 1.25*  --   --   --   LATICACIDVEN  --  4.67* 1.8 1.7    Estimated Creatinine Clearance: 66 mL/min (by C-G formula based on SCr of 1.25 mg/dL (H)).    Allergies  Allergen Reactions  . Darvocet [Propoxyphene N-Acetaminophen] Palpitations  . Propoxyphene Palpitations   Antimicrobials this admission: Cefepime 10/11 >>  Zosyn 10/11 x 1 dose  Vancomycin 10/11 x 1 dose  Dose adjustments this admission:  Microbiology results: 10/11 BCx: pending 10/11 UCx: pending   Thank you for allowing pharmacy to be a part of this patient's care.  Hart Robinsons A 07/08/2016 12:57 PM

## 2016-07-08 NOTE — H&P (Signed)
History and Physical    Juan Wheeler ZOX:[id number] DOB: 1960-12-06 DOA: 07/08/2016  PCP: [name], MD   Patient coming from: Home  Chief Complaint: Dyspnea, malaise, dysuria  HPI: Juan Wheeler is a 55 y.o. male with medical history significant for CAD with stent, history of DVT on Eliquis, colorectal cancer status post partial colectomy, and recent protracted hospital course due to necrotizing fasciitis with Fournier's gangrene status post extensive debridement at the left buttock, now presenting to the emergency department for evaluation of acute onset of dyspnea and malaise with dysuria. Patient was just discharged from a skilled nursing facility on 07/03/2016 and had initially done well at home, but noted some dysuria and malaise developing. He woke this morning with acute dyspnea, but no chest pain or palpitations, no orthopnea, and no significant cough. Patient denies fevers or chills and denies abdominal pain, nausea, vomiting, or diarrhea. There has been no increase in pain or drainage from the left buttock wound. Patient did not attempt any interventions for his symptoms prior to coming in.  ED Course: Upon arrival to the ED, patient is found to be febrile to 38.3 C, saturating low 90s on room air, tachycardic and low 100s, tachypneic in the high 20s, and with stable blood pressure. EKG demonstrates a sinus rhythm and chest x-ray is notable for emphysematous changes but no acute cardiopulmonary disease. CMP features a potassium of 3.3 and creatinine 1.25, up from 0.99 last month. CBC is notable for a leukocytosis to 12,100 which appears stable relative to recent priors. Troponin is undetectable, BNP is mildly elevated to 167, and lactic acid is elevated to a value of 4.67. Urinalysis features bacteria with small leukocyte, negative nitrite, and too numerous to count bacteria. Blood and urine cultures were obtained, a 30 cc/kg NS bolus was given, and empiric treatment was initiated in  the form of vancomycin and Zosyn. Symptomatic care was provided with acetaminophen and albuterol. Tachycardia resolved with the fluid in the patient's dyspnea improved with albuterol. He was hemodynamically stable and will be admitted to the telemetry unit for ongoing evaluation and management of sepsis suspected secondary to UTI.   Review of Systems:  All other systems reviewed and apart from HPI, are negative.  Past Medical History:  Diagnosis Date  . Chronic lower back pain    a. Followed by pain management at [facility].  . Colon cancer [facility])    rectal cancer  . Coronary artery disease    a. 03/2013: abnl nuc -> LHC s/p DES to LCx, residual moderate disease in LAD (med rx unless refractory angina). b. Not on BB due to bradycardia.  Marland Kitchen DVT (deep venous thrombosis) ([place]) ~ 2013  . GERD (gastroesophageal reflux disease)   . History of blood transfusion    "once; after throwing up alot of blood" (04/17/2013)  . Hypertension   . LV dysfunction    a. EF 45% in 03/2013.  Marland Kitchen PAD (peripheral artery disease) ([place])    a. Occlusion of the right internal iliac artery, with significant atherosclerosis in the left internal iliac which was not amenable to reconstruction per notes from [facility].  . Pulmonary nodules 09/30/2014  . Rectal cancer ([place])   . Tobacco abuse     Past Surgical History:  Procedure Laterality Date  . ABDOMINAL SURGERY  1990's   'for stomach ulcers" (04/17/2013)  . COLECTOMY  2012   "for rectal cancer" (04/17/2013)  . COLOSTOMY    . COLOSTOMY TAKEDOWN  2013  . CORONARY ANGIOPLASTY WITH  STENT PLACEMENT  04/17/2013   "?1" (04/17/2013)  . FEMORAL-POPLITEAL BYPASS GRAFT Left 07/02/2015   Procedure: BYPASS GRAFT LEFT COMMON FEMORAL ARTERY TO LEFT ABOVE KNEE POPLITEAL ARTERY - USING LEFT GREATER SAPPHENOUS VEIN;  Surgeon: Elam Dutch, MD;  Location: [place];  Service: Vascular;  Laterality: Left;  . INCISION AND DRAINAGE ABSCESS Left 06/05/2016   Procedure: INCISION AND DRAINAGE  ABSCESS;  Surgeon: Aviva Signs, MD;  Location: [facility];  Service: General;  Laterality: Left;  . INCISION AND DRAINAGE PERIRECTAL ABSCESS Left 06/07/2016   Procedure: IRRIGATION AND DEBRIDEMENT LEFT BUTTOCK ABSCESS;  Surgeon: Greer Pickerel, MD;  Location: [place];  Service: General;  Laterality: Left;  . INGUINAL HERNIA REPAIR Bilateral 1990's  . LAPAROSCOPIC PARTIAL COLECTOMY N/A 06/11/2016   Procedure: LAPAROSCOPIC  OPEN COLOSTOMY;  Surgeon: [name], MD;  Location: [place];  Service: General;  Laterality: N/A;  . LEFT HEART CATHETERIZATION WITH CORONARY ANGIOGRAM N/A 04/17/2013   Procedure: LEFT HEART CATHETERIZATION WITH CORONARY ANGIOGRAM;  Surgeon: Peter M Martinique, MD;  Location: [facility] CATH LAB;  Service: Cardiovascular;  Laterality: N/A;  . PERCUTANEOUS STENT INTERVENTION  04/17/2013   Procedure: PERCUTANEOUS STENT INTERVENTION;  Surgeon: Peter M Martinique, MD;  Location: [facility] CATH LAB;  Service: Cardiovascular;;  . PERIPHERAL VASCULAR CATHETERIZATION N/A 06/14/2015   Procedure: Abdominal Aortogram;  Surgeon: Elam Dutch, MD;  Location: [place] CV LAB;  Service: Cardiovascular;  Laterality: N/A;  . PERIPHERAL VASCULAR CATHETERIZATION Bilateral 06/14/2015   Procedure: Lower Extremity Angiography;  Surgeon: Elam Dutch, MD;  Location: [place] CV LAB;  Service: Cardiovascular;  Laterality: Bilateral;  . VEIN HARVEST Left 07/02/2015   Procedure: VEIN HARVEST - LEFT GREATER SAPPHENOUS VEIN;  Surgeon: Elam Dutch, MD;  Location: [place];  Service: Vascular;  Laterality: Left;     reports that he has been smoking Cigarettes.  He started smoking about 42 years ago. He has a 10.00 pack-year smoking history. He has never used smokeless tobacco. He reports that he drinks about 4.2 oz of alcohol per week . He reports that he uses drugs, including Marijuana.  Allergies  Allergen Reactions  . Darvocet [Propoxyphene N-Acetaminophen] Palpitations  . Propoxyphene Palpitations    Family  History  Problem Relation Age of Onset  . Cancer Mother   . Hypertension Mother   . Bleeding Disorder Brother      Prior to Admission medications   Medication Sig Start Date End Date Taking? Authorizing Provider  apixaban (ELIQUIS) 5 MG TABS tablet Take 1 tablet (5 mg total) by mouth 2 (two) times daily. 06/16/16   Domenic Polite, MD  isosorbide mononitrate (IMDUR) 30 MG 24 hr tablet TAKE 1/2 TABLET BY MOUTH ONCE DAILY. Patient taking differently: Take 15 mg by mouth daily.  12/04/15   Herminio Commons, MD  lovastatin (MEVACOR) 10 MG tablet Take 1 tablet (10 mg total) by mouth at bedtime. 07/04/15   Alvia Grove, [name]  metoprolol tartrate (LOPRESSOR) 25 MG tablet Take 1 tablet (25 mg total) by mouth 2 (two) times daily. 06/16/16   Domenic Polite, MD  nitroGLYCERIN (NITROSTAT) 0.4 MG SL tablet PLACE 1 TABLET UNDER TONGUE IF NEEDED FOR CHEST PAIN..UP TO 3 DOSES 02/01/15   Herminio Commons, MD  omeprazole (PRILOSEC) 40 MG capsule Take 40 mg by mouth daily. 05/29/15   Historical Provider, MD  oxyCODONE (OXY IR/ROXICODONE) 5 MG immediate release tablet Take 1-2 tablets (5-10 mg total) by mouth every 6 (six) hours as needed for moderate pain.  06/16/16   Domenic Polite, MD  vitamin C (VITAMIN C) 500 MG tablet Take 1 tablet (500 mg total) by mouth daily. 06/17/16   Domenic Polite, MD  zinc sulfate 220 (50 Zn) MG capsule Take 1 capsule (220 mg total) by mouth daily. 06/17/16   Domenic Polite, MD    Physical Exam: Vitals:   07/08/16 0330 07/08/16 0359 07/08/16 0400 07/08/16 0433  BP: 119/61  123/84 144/78  Pulse: 102  96 97  Resp: (!) '27  13 25  '$ Temp:      TempSrc:      SpO2: 91% 93% 99% 95%  Weight:      Height:          Constitutional: NAD, calm, chronically-ill in appearance; appearing older than stated age  Eyes: PERTLA, lids and conjunctivae normal ENMT: Mucous membranes are dry. Posterior pharynx clear of any exudate or lesions.   Neck: normal, supple, no masses, no  thyromegaly Respiratory: Mildly diminished bilaterally, no wheezing, no crackles. Normal respiratory effort.   Cardiovascular: S1 & S2 heard, regular rate and rhythm. No extremity edema. No significant JVD. Abdomen: No distension, suprapubic tendernss, colostomy site without surrounding edema or erythema, no masses palpated. Bowel sounds normal.  Musculoskeletal: no clubbing / cyanosis. No joint deformity upper and lower extremities. Normal muscle tone.  Skin: Large open defect at left buttock is clean and dry without significant surrounding erythema, edema, or tenderness; no foul odor or purulence. Warm, dry, well-perfused. Neurologic: CN 2-12 grossly intact. Sensation intact, DTR normal. Strength 5/5 in all 4 limbs.  Psychiatric: Normal judgment and insight. Alert and oriented x 3. Normal mood and affect.     Labs on Admission: I have personally reviewed following labs and imaging studies  CBC:  Recent Labs Lab 07/08/16 0238  WBC 12.1*  NEUTROABS 10.2*  HGB 13.9  HCT 41.2  MCV 94.9  PLT 009   Basic Metabolic Panel:  Recent Labs Lab 07/08/16 0238  NA 139  K 3.3*  CL 103  CO2 26  GLUCOSE 136*  BUN 6  CREATININE 1.25*  CALCIUM 8.9   GFR: Estimated Creatinine Clearance: 66 mL/min (by C-G formula based on SCr of 1.25 mg/dL (H)). Liver Function Tests:  Recent Labs Lab 07/08/16 0238  AST 25  ALT 29  ALKPHOS 96  BILITOT 0.6  PROT 8.3*  ALBUMIN 3.5   No results for input(s): LIPASE, AMYLASE in the last 168 hours. No results for input(s): AMMONIA in the last 168 hours. Coagulation Profile: No results for input(s): INR, PROTIME in the last 168 hours. Cardiac Enzymes:  Recent Labs Lab 07/08/16 0247  TROPONINI <0.03   BNP (last 3 results) No results for input(s): PROBNP in the last 8760 hours. HbA1C: No results for input(s): HGBA1C in the last 72 hours. CBG: No results for input(s): GLUCAP in the last 168 hours. Lipid Profile: No results for input(s): CHOL,  HDL, LDLCALC, TRIG, CHOLHDL, LDLDIRECT in the last 72 hours. Thyroid Function Tests: No results for input(s): TSH, T4TOTAL, FREET4, T3FREE, THYROIDAB in the last 72 hours. Anemia Panel: No results for input(s): VITAMINB12, FOLATE, FERRITIN, TIBC, IRON, RETICCTPCT in the last 72 hours. Urine analysis:    Component Value Date/Time   COLORURINE YELLOW 07/08/2016 [place] 07/08/2016 0342   LABSPEC 1.010 07/08/2016 0342   PHURINE 6.5 07/08/2016 0342   GLUCOSEU NEGATIVE 07/08/2016 0342   HGBUR TRACE (A) 07/08/2016 0342   BILIRUBINUR NEGATIVE 07/08/2016 0342   KETONESUR NEGATIVE 07/08/2016 0342  PROTEINUR NEGATIVE 07/08/2016 0342   UROBILINOGEN 1.0 06/24/2015 0837   NITRITE NEGATIVE 07/08/2016 0342   LEUKOCYTESUR SMALL (A) 07/08/2016 0342   Sepsis Labs: '@LABRCNTIP'$ (procalcitonin:4,lacticidven:4) )No results found for this or any previous visit (from the past 240 hour(s)).   Radiological Exams on Admission: Dg Chest Portable 1 View  Result Date: 07/08/2016 CLINICAL DATA:  Awoke today with shortness of breath.  Fever. EXAM: PORTABLE CHEST 1 VIEW COMPARISON:  Radiographs 06/10/2016.  Chest CT 12/25/2015 FINDINGS: Emphysematous change. No focal airspace disease. No pulmonary edema. Normal heart size and mediastinal contours. No pleural fluid or pneumothorax. No acute osseous abnormalities are seen. IMPRESSION: Emphysema without acute abnormality. Electronically Signed   By: Jeb Levering M.D.   On: 07/08/2016 03:13    EKG: Independently reviewed. Sinus rhythm.  Assessment/Plan  1. Sepsis, suspected secondary to UTI - Presents with fever, tachycardia, elevated lactate  - CXR clear, UA suggestive of infection; buttock wound does not appear to be infected  - Blood and urine cultures are incubating  - 30 cc/kg NS bolus given in ED - Empiric vancomycin and Zosyn initiated in ED; will continue empiric coverage with cefepime monotherapy as source appears to be urinary, he  was recently discharged from SNF, and MRSA screens have been historically negative - Trend lactate, follow-up cultures    2. Hypokalemia  - Serum potassium is 3.3 on admission  - Give 20 mEq K-Dur and 10 mEq IV KCl  - Check mag level and replete prn    3. CAD  - No anginal complaints, troponin undetectable, and EKG without acute ischemic features - Hx of DES to LCx in 2014  - Continue Imdur, Lopressor, statin as tolerated    4. GERD  - Stable, no EGD report on file  - Continue daily PPI   5. Chronic pain - Stable, will continue home regimen of oxycodone 5-10 mg q6h prn    6. Hx of DVT  - No evidence for acute VTE  - Continue Eliquis   7. Paroxysmal atrial fibrillation  - In sinus rhythm on admission  - CHADS-VASc only 1 or 2 (CAD, ?HTN), but on [facility] for DVT hx  - Continue Lopressor and Eliquis  - Monitoring on telemetry    DVT prophylaxis: Eliquis Code Status: Full  Family Communication: Wife updated at bedside Disposition Plan: Admit to telemetry Consults called: None Admission status: Inpatient    Vianne Bulls, MD [facility] Pager [phone]  If 7PM-7AM, please contact night-coverage [email or web address] Password [email or web address]  07/08/2016, 4:56 AM

## 2016-07-08 NOTE — ED Notes (Signed)
Pt awake now. Delay explained. Pt offered breakfast but went back to sleep while food was being heated

## 2016-07-08 NOTE — ED Notes (Signed)
Floor unable to take report at this time.

## 2016-07-08 NOTE — ED Notes (Addendum)
Juan Wheeler, Juan Wheeler, wife

## 2016-07-08 NOTE — ED Triage Notes (Signed)
Pt c/o sob that woke him up 30 mins pta; pt denies any pain

## 2016-07-08 NOTE — ED Provider Notes (Signed)
[facility] DEPT Provider Note   CSN: [id number] Arrival date & time: 07/08/16  0222     History   Chief Complaint Chief Complaint  Patient presents with  . Shortness of Breath    HPI Juan Wheeler is a 55 y.o. male.  HPI  This is a 55 year old male with a history of colon cancer, hypertension, rectal cancer, DVT, recent history of necrotizing fasciitis who presents with shortness of breath. Patient reports that he woke up from sleep feeling short of breath approximately 30 minutes prior to arrival. He states that he went to bed feeling okay. Denies any coughs or fevers. Was just discharged on Friday from [place] after a prolonged hospitalization for necrotizing fasciitis. Reports that he has not noticed any new drainage from his wound. He reports chills without fevers. Does report some anterior chest pain. Nothing seems to make it better or worse. Current pain is 5 out of 10.  Past Medical History:  Diagnosis Date  . Chronic lower back pain    a. Followed by pain management at [facility].  . Colon cancer [facility])    rectal cancer  . Coronary artery disease    a. 03/2013: abnl nuc -> LHC s/p DES to LCx, residual moderate disease in LAD (med rx unless refractory angina). b. Not on BB due to bradycardia.  Marland Kitchen DVT (deep venous thrombosis) ([place]) ~ 2013  . GERD (gastroesophageal reflux disease)   . History of blood transfusion    "once; after throwing up alot of blood" (04/17/2013)  . Hypertension   . LV dysfunction    a. EF 45% in 03/2013.  Marland Kitchen PAD (peripheral artery disease) ([place])    a. Occlusion of the right internal iliac artery, with significant atherosclerosis in the left internal iliac which was not amenable to reconstruction per notes from [facility].  . Pulmonary nodules 09/30/2014  . Rectal cancer ([place])   . Tobacco abuse     Patient Active Problem List   Diagnosis Date Noted  . AKI (acute kidney injury) ([place])   . Fournier's gangrene in male 06/05/2016  . Abscess  06/05/2016  . Sepsis ([place])   . Necrotizing fasciitis ([place])   . Central venous catheter in place   . Acute respiratory failure ([place])   . PAD (peripheral artery disease) ([place]) 07/02/2015  . Preoperative cardiovascular examination 06/12/2015  . Cardiomyopathy, ischemic 06/12/2015  . Pulmonary nodules 09/30/2014  . ASCVD (arteriosclerotic cardiovascular disease) 05/02/2013  . Unstable angina ([place]) 04/18/2013  . Chest pain 03/29/2013  . Tobacco abuse 03/29/2013  . Chronic pain syndrome 11/09/2012  . Pain in joint, pelvic region and thigh 11/09/2012  . Neuralgia and neuritis 10/12/2012  . Atherosclerosis of native arteries of extremity with intermittent claudication ([place]) 08/19/2012  . Backache 03/24/2012  . Peripheral vascular disease ([place]) 12/04/2011  . Compression of vein 12/04/2011  . Heartburn 12/03/2011  . Personal history of digestive disease 11/19/2011  . Depressive disorder 09/24/2011  . Dysuria 08/31/2011  . Impotence of organic origin 08/31/2011  . Urinary frequency 08/31/2011  . Constipation 06/05/2011  . Essential hypertension 06/05/2011  . Anal or rectal pain 06/05/2011  . Malignant neoplasm of rectum ([place]) 12/02/2010    Past Surgical History:  Procedure Laterality Date  . ABDOMINAL SURGERY  1990's   'for stomach ulcers" (04/17/2013)  . COLECTOMY  2012   "for rectal cancer" (04/17/2013)  . COLOSTOMY    . COLOSTOMY TAKEDOWN  2013  . CORONARY ANGIOPLASTY WITH STENT PLACEMENT  04/17/2013   "?1" (  04/17/2013)  . FEMORAL-POPLITEAL BYPASS GRAFT Left 07/02/2015   Procedure: BYPASS GRAFT LEFT COMMON FEMORAL ARTERY TO LEFT ABOVE KNEE POPLITEAL ARTERY - USING LEFT GREATER SAPPHENOUS VEIN;  Surgeon: Elam Dutch, MD;  Location: [place];  Service: Vascular;  Laterality: Left;  . INCISION AND DRAINAGE ABSCESS Left 06/05/2016   Procedure: INCISION AND DRAINAGE ABSCESS;  Surgeon: Aviva Signs, MD;  Location: [facility];  Service: General;  Laterality: Left;  . INCISION AND DRAINAGE  PERIRECTAL ABSCESS Left 06/07/2016   Procedure: IRRIGATION AND DEBRIDEMENT LEFT BUTTOCK ABSCESS;  Surgeon: Greer Pickerel, MD;  Location: [place];  Service: General;  Laterality: Left;  . INGUINAL HERNIA REPAIR Bilateral 1990's  . LAPAROSCOPIC PARTIAL COLECTOMY N/A 06/11/2016   Procedure: LAPAROSCOPIC  OPEN COLOSTOMY;  Surgeon: [name], MD;  Location: [place];  Service: General;  Laterality: N/A;  . LEFT HEART CATHETERIZATION WITH CORONARY ANGIOGRAM N/A 04/17/2013   Procedure: LEFT HEART CATHETERIZATION WITH CORONARY ANGIOGRAM;  Surgeon: Peter M Martinique, MD;  Location: [facility] CATH LAB;  Service: Cardiovascular;  Laterality: N/A;  . PERCUTANEOUS STENT INTERVENTION  04/17/2013   Procedure: PERCUTANEOUS STENT INTERVENTION;  Surgeon: Peter M Martinique, MD;  Location: [facility] C[facility] LAB;  Service: Cardiovascular;;  . PERIPHERAL VASCULAR CATHETERIZATION N/A 06/14/2015   Procedure: Abdominal Aortogram;  Surgeon: Elam Dutch, MD;  Location: [place] CV LAB;  Service: Cardiovascular;  Laterality: N/A;  . PERIPHERAL VASCULAR CATHETERIZATION Bilateral 06/14/2015   Procedure: Lower Extremity Angiography;  Surgeon: Elam Dutch, MD;  Location: [place] CV LAB;  Service: Cardiovascular;  Laterality: Bilateral;  . VEIN HARVEST Left 07/02/2015   Procedure: VEIN HARVEST - LEFT GREATER SAPPHENOUS VEIN;  Surgeon: Elam Dutch, MD;  Location: [place];  Service: Vascular;  Laterality: Left;       Home Medications    Prior to Admission medications   Medication Sig Start Date End Date Taking? Authorizing Provider  apixaban (ELIQUIS) 5 MG TABS tablet Take 1 tablet (5 mg total) by mouth 2 (two) times daily. 06/16/16   Domenic Polite, MD  isosorbide mononitrate (IMDUR) 30 MG 24 hr tablet TAKE 1/2 TABLET BY MOUTH ONCE DAILY. Patient taking differently: Take 15 mg by mouth daily.  12/04/15   Herminio Commons, MD  lovastatin (MEVACOR) 10 MG tablet Take 1 tablet (10 mg total) by mouth at bedtime. 07/04/15   Alvia Grove, [name]  metoprolol tartrate (LOPRESSOR) 25 MG tablet Take 1 tablet (25 mg total) by mouth 2 (two) times daily. 06/16/16   Domenic Polite, MD  nitroGLYCERIN (NITROSTAT) 0.4 MG SL tablet PLACE 1 TABLET UNDER TONGUE IF NEEDED FOR CHEST PAIN..UP TO 3 DOSES 02/01/15   Herminio Commons, MD  omeprazole (PRILOSEC) 40 MG capsule Take 40 mg by mouth daily. 05/29/15   Historical Provider, MD  oxyCODONE (OXY IR/ROXICODONE) 5 MG immediate release tablet Take 1-2 tablets (5-10 mg total) by mouth every 6 (six) hours as needed for moderate pain. 06/16/16   Domenic Polite, MD  vitamin C (VITAMIN C) 500 MG tablet Take 1 tablet (500 mg total) by mouth daily. 06/17/16   Domenic Polite, MD  zinc sulfate 220 (50 Zn) MG capsule Take 1 capsule (220 mg total) by mouth daily. 06/17/16   Domenic Polite, MD    Family History Family History  Problem Relation Age of Onset  . Cancer Mother   . Hypertension Mother   . Bleeding Disorder Brother     Social History Social History  Substance Use Topics  . Smoking  status: Light Tobacco Smoker    Packs/day: 0.25    Years: 40.00    Types: Cigarettes    Start date: 03/14/1974  . Smokeless tobacco: Never Used     Comment: 5-6 per day 06/12/15  . Alcohol use 4.2 oz/week    6 Cans of beer, 1 Shots of liquor per week     Comment: 04/17/2013 "bout a 6 pack/wk"     Allergies   Darvocet [propoxyphene n-acetaminophen] and Propoxyphene   Review of Systems Review of Systems  Constitutional: Positive for chills and fever.  Respiratory: Positive for shortness of breath. Negative for cough.   Cardiovascular: Positive for chest pain. Negative for leg swelling.  Gastrointestinal: Negative for abdominal pain, nausea and vomiting.  Genitourinary: Negative for dysuria.  Skin: Positive for wound. Negative for color change.  All other systems reviewed and are negative.    Physical Exam Updated Vital Signs BP 123/84   Pulse 96   Temp 101 F (38.3 C) (Rectal)   Resp 13   Ht  '5\' 9"'$  (1.753 m)   Wt 172 lb (78 kg)   SpO2 99%   BMI 25.40 kg/m   Physical Exam  Constitutional: He is oriented to person, place, and time. No distress.  Chronically ill-appearing  HENT:  Head: Normocephalic and atraumatic.  Mucous membranes dry  Eyes: Pupils are equal, round, and reactive to light.  Cardiovascular: Regular rhythm and normal heart sounds.   No murmur heard. Tachycardia  Pulmonary/Chest: Effort normal and breath sounds normal. No respiratory distress. He has no wheezes.  Fair air movement, occasional expiratory wheeze, no rales or crackles  Abdominal: Soft. Bowel sounds are normal. There is no tenderness. There is no rebound.  Musculoskeletal: He exhibits no edema.  Neurological: He is alert and oriented to person, place, and time.  Skin: Skin is warm and dry.  Large healing wound just left of the gluteal cleft, margins clear, granulation tissue noted, serosanguineous drainage, no adjacent erythema or crepitus  Psychiatric: He has a normal mood and affect.  Nursing note and vitals reviewed.    ED Treatments / Results  Labs (all labs ordered are listed, but only abnormal results are displayed) Labs Reviewed  COMPREHENSIVE METABOLIC PANEL - Abnormal; Notable for the following:       Result Value   Potassium 3.3 (*)    Glucose, Bld 136 (*)    Creatinine, Ser 1.25 (*)    Total Protein 8.3 (*)    All other components within normal limits  CBC WITH DIFFERENTIAL/PLATELET - Abnormal; Notable for the following:    WBC 12.1 (*)    Neutro Abs 10.2 (*)    All other components within normal limits  BRAIN NATRIURETIC PEPTIDE - Abnormal; Notable for the following:    B Natriuretic Peptide 167.0 (*)    All other components within normal limits  I-STAT CG4 LACTIC ACID, ED - Abnormal; Notable for the following:    Lactic Acid, Venous 4.67 (*)    All other components within normal limits  CULTURE, BLOOD (ROUTINE X 2)  CULTURE, BLOOD (ROUTINE X 2)  URINE CULTURE    TROPONIN I  URINALYSIS, ROUTINE W REFLEX MICROSCOPIC (NOT AT [facility])    EKG  EKG Interpretation  Date/Time:  Wednesday July 08 2016 02:37:54 EDT Ventricular Rate:  100 PR Interval:    QRS Duration: 101 QT Interval:  345 QTC Calculation: 445 R Axis:   74 Text Interpretation:  Sinus tachycardia Right atrial enlargement Consider right ventricular hypertrophy Confirmed  by Dina Rich  MD, Loma Sousa ([id number]) on 07/08/2016 3:18:26 AM       Radiology Dg Chest Portable 1 View  Result Date: 07/08/2016 CLINICAL DATA:  Awoke today with shortness of breath.  Fever. EXAM: PORTABLE CHEST 1 VIEW COMPARISON:  Radiographs 06/10/2016.  Chest CT 12/25/2015 FINDINGS: Emphysematous change. No focal airspace disease. No pulmonary edema. Normal heart size and mediastinal contours. No pleural fluid or pneumothorax. No acute osseous abnormalities are seen. IMPRESSION: Emphysema without acute abnormality. Electronically Signed   By: Jeb Levering M.D.   On: 07/08/2016 03:13    Procedures Procedures (including critical care time)  CRITICAL CARE Performed by: Merryl Hacker   Total critical care time: 30 minutes  Critical care time was exclusive of separately billable procedures and treating other patients.  Critical care was necessary to treat or prevent imminent or life-threatening deterioration.  Critical care was time spent personally by me on the following activities: development of treatment plan with patient and/or surrogate as well as nursing, discussions with consultants, evaluation of patient's response to treatment, examination of patient, obtaining history from patient or surrogate, ordering and performing treatments and interventions, ordering and review of laboratory studies, ordering and review of radiographic studies, pulse oximetry and re-evaluation of patient's condition.   Medications Ordered in ED Medications  vancomycin (VANCOCIN) IVPB 1000 mg/200 mL premix (1,000 mg  Intravenous New Bag/Given 07/08/16 0326)  acetaminophen (TYLENOL) tablet 650 mg (not administered)  sodium chloride 0.9 % bolus 2,500 mL (2,500 mLs Intravenous New Bag/Given 07/08/16 0317)  piperacillin-tazobactam (ZOSYN) IVPB 3.375 g (0 g Intravenous Stopped 07/08/16 0406)  ipratropium-albuterol (DUONEB) 0.5-2.5 (3) MG/3ML nebulizer solution 3 mL (3 mLs Nebulization Given 07/08/16 0359)     Initial Impression / Assessment and Plan / ED Course  I have reviewed the triage vital signs and the nursing notes.  Pertinent labs & imaging results that were available during my care of the patient were reviewed by me and considered in my medical decision making (see chart for details).  Clinical Course    Patient presents with shortness of breath. Fairly acute in onset. Noted to be febrile to 101. Sepsis workup initiated. No hypotension. However, lactate greater than 4. Patient given 30 mL/kg of fluid. He was given vancomycin and Zosyn for broad-spectrum coverage. Chest x-ray does not show any evidence of pneumonia at this time because lack behind. He does have some scant wheezing and is a smoker. He was given a DuoNeb. This improved his tachypnea and O2 sats. Initially in the low 90s. Lab work is at the patient's baseline. He has a persistent mild leukocytosis at 12. Examination of his buttock wound does not show any obvious source of infection. Urinalysis pending. On recheck, patient reports improvement after DuoNeb. His vital signs have remained stable and he is remained normotensive. Will admit to the hospital for further evaluation.   Final Clinical Impressions(s) / ED Diagnoses   Final diagnoses:  SOB (shortness of breath)  Sepsis, due to unspecified organism [facility])    New Prescriptions New Prescriptions   No medications on file     Merryl Hacker, MD 07/08/16 [phone]

## 2016-07-08 NOTE — ED Notes (Signed)
When nurse received report this AM was told that pt could go to the floor after 0700. When nurse attempted to call report was told by nurse in ICU they only had two nurses and they were not taking any more pt's. AC aware.

## 2016-07-09 LAB — GLUCOSE, CAPILLARY: Glucose-Capillary: 97 mg/dL (ref 65–99)

## 2016-07-09 LAB — URINE CULTURE: Culture: <10000 — AB

## 2016-07-09 NOTE — Consult Note (Signed)
   [facility] CM Inpatient Consult   07/09/2016  Juan Wheeler 06-Jul-1961 [id number]   Spoke with patient at bedside regarding [facility] services. Patient states he has home care services and does not want to participate with [facility] at this time. [facility] and contact information for future reference, voices appreciation of information.    Inpatient case manager aware that patient offered [facility] case management services but declined.   Of note, [facility] Care Management services would not replace or interfere with any services that are arranged by inpatient case management or social work.  For additional questions or referrals please contact:   Royetta Crochet. Laymond Purser, RN, BSN, [facility] Liaison [phone]

## 2016-07-09 NOTE — Progress Notes (Signed)
Subjective: Patient was admitted yesterday due to sepsis. He is started on combinations of antibiotics. Patient feels better today. His fever is subsiding. Patient recently had a prolonged hospital stay due to necrotizing fascitis of the buttock area. He has healing and granulating open wound which doesn't seem to be infected.  Objective: Vital signs in last 24 hours: Temp:  [97.5 F (36.4 C)-98.7 F (37.1 C)] 97.5 F (36.4 C) (10/12 0626) Pulse Rate:  [58-89] 58 (10/12 0626) Resp:  [18-20] 20 (10/12 0626) BP: (100-129)/(63-79) 123/79 (10/12 0626) SpO2:  [94 %-100 %] 100 % (10/12 0626) Weight:  [78.8 kg (173 lb 12.8 oz)] 78.8 kg (173 lb 12.8 oz) (10/11 1311) Weight change: 0.816 kg (1 lb 12.8 oz) Last BM Date: 07/08/16  Intake/Output from previous day: 10/11 0701 - 10/12 0700 In: 726.3 [I.V.:676.3; IV Piggyback:50] Out: 2700 [Urine:2600; Stool:100]  PHYSICAL EXAM General appearance: alert and no distress Resp: clear to auscultation bilaterally Cardio: S1, S2 normal GI: soft, non-tender; bowel sounds normal; no masses,  no organomegaly Extremities: extremities normal, atraumatic, no cyanosis or edema  Lab Results:  Results for orders placed or performed during the hospital encounter of 07/08/16 (from the past 48 hour(s))  Comprehensive metabolic panel     Status: Abnormal   Collection Time: 07/08/16  2:38 AM  Result Value Ref Range   Sodium 139 135 - 145 mmol/L   Potassium 3.3 (L) 3.5 - 5.1 mmol/L   Chloride 103 101 - 111 mmol/L   CO2 26 22 - 32 mmol/L   Glucose, Bld 136 (H) 65 - 99 mg/dL   BUN 6 6 - 20 mg/dL   Creatinine, Ser 1.25 (H) 0.61 - 1.24 mg/dL   Calcium 8.9 8.9 - 10.3 mg/dL   Total Protein 8.3 (H) 6.5 - 8.1 g/dL   Albumin 3.5 3.5 - 5.0 g/dL   AST 25 15 - 41 U/L   ALT 29 17 - 63 U/L   Alkaline Phosphatase 96 38 - 126 U/L   Total Bilirubin 0.6 0.3 - 1.2 mg/dL   GFR calc non Af Amer >60 >60 mL/min   GFR calc Af Amer >60 >60 mL/min    Comment: (NOTE) The eGFR  has been calculated using the CKD EPI equation. This calculation has not been validated in all clinical situations. eGFR's persistently <60 mL/min signify possible Chronic Kidney Disease.    Anion gap 10 5 - 15  CBC WITH DIFFERENTIAL     Status: Abnormal   Collection Time: 07/08/16  2:38 AM  Result Value Ref Range   WBC 12.1 (H) 4.0 - 10.5 K/uL   RBC 4.34 4.22 - 5.81 MIL/uL   Hemoglobin 13.9 13.0 - 17.0 g/dL   HCT 41.2 39.0 - 52.0 %   MCV 94.9 78.0 - 100.0 fL   MCH 32.0 26.0 - 34.0 pg   MCHC 33.7 30.0 - 36.0 g/dL   RDW 14.0 11.5 - 15.5 %   Platelets 251 150 - 400 K/uL   Neutrophils Relative % 84 %   Neutro Abs 10.2 (H) 1.7 - 7.7 K/uL   Lymphocytes Relative 12 %   Lymphs Abs 1.4 0.7 - 4.0 K/uL   Monocytes Relative 2 %   Monocytes Absolute 0.2 0.1 - 1.0 K/uL   Eosinophils Relative 2 %   Eosinophils Absolute 0.3 0.0 - 0.7 K/uL   Basophils Relative 0 %   Basophils Absolute 0.0 0.0 - 0.1 K/uL  Blood Culture (routine x 2)     Status: None (Preliminary result)  Collection Time: 07/08/16  2:38 AM  Result Value Ref Range   Specimen Description BLOOD RIGHT ANTECUBITAL    Special Requests BOTTLES DRAWN AEROBIC AND ANAEROBIC 6CC EACH    Culture NO GROWTH < 12 HOURS    Report Status PENDING   Blood Culture (routine x 2)     Status: None (Preliminary result)   Collection Time: 07/08/16  2:43 AM  Result Value Ref Range   Specimen Description BLOOD RIGHT WRIST    Special Requests      BOTTLES DRAWN AEROBIC AND ANAEROBIC AEB 6CC ANA 4CC   Culture NO GROWTH < 12 HOURS    Report Status PENDING   Troponin I     Status: None   Collection Time: 07/08/16  2:47 AM  Result Value Ref Range   Troponin I <0.03 <0.03 ng/mL  Brain natriuretic peptide     Status: Abnormal   Collection Time: 07/08/16  2:47 AM  Result Value Ref Range   B Natriuretic Peptide 167.0 (H) 0.0 - 100.0 pg/mL  I-Stat CG4 Lactic Acid, ED  (not at  [facility])     Status: Abnormal   Collection Time: 07/08/16  3:00 AM  Result  Value Ref Range   Lactic Acid, Venous 4.67 (HH) 0.5 - 1.9 mmol/L   Comment NOTIFIED PHYSICIAN   Urinalysis, Routine w reflex microscopic (not at [facility])     Status: Abnormal   Collection Time: 07/08/16  3:42 AM  Result Value Ref Range   Color, Urine YELLOW YELLOW   APPearance CLEAR CLEAR   Specific Gravity, Urine 1.010 1.005 - 1.030   pH 6.5 5.0 - 8.0   Glucose, UA NEGATIVE NEGATIVE mg/dL   Hgb urine dipstick TRACE (A) NEGATIVE   Bilirubin Urine NEGATIVE NEGATIVE   Ketones, ur NEGATIVE NEGATIVE mg/dL   Protein, ur NEGATIVE NEGATIVE mg/dL   Nitrite NEGATIVE NEGATIVE   Leukocytes, UA SMALL (A) NEGATIVE  Urine microscopic-add on     Status: Abnormal   Collection Time: 07/08/16  3:42 AM  Result Value Ref Range   Squamous Epithelial / LPF 0-5 (A) NONE SEEN   WBC, UA TOO NUMEROUS TO COUNT 0 - 5 WBC/hpf   RBC / HPF 0-5 0 - 5 RBC/hpf   Bacteria, UA FEW (A) NONE SEEN  Lactic acid, plasma     Status: None   Collection Time: 07/08/16  5:24 AM  Result Value Ref Range   Lactic Acid, Venous 1.8 0.5 - 1.9 mmol/L  Procalcitonin     Status: None   Collection Time: 07/08/16  5:24 AM  Result Value Ref Range   Procalcitonin 0.93 ng/mL  Protime-INR     Status: None   Collection Time: 07/08/16  5:24 AM  Result Value Ref Range   Prothrombin Time 14.6 11.4 - 15.2 seconds   INR 1.13   APTT     Status: Abnormal   Collection Time: 07/08/16  5:24 AM  Result Value Ref Range   aPTT 44 (H) 24 - 36 seconds    Comment:        IF BASELINE aPTT IS ELEVATED, SUGGEST PATIENT RISK ASSESSMENT BE USED TO DETERMINE APPROPRIATE ANTICOAGULANT THERAPY.   Magnesium     Status: Abnormal   Collection Time: 07/08/16  5:45 AM  Result Value Ref Range   Magnesium 1.4 (L) 1.7 - 2.4 mg/dL  Lactic acid, plasma     Status: None   Collection Time: 07/08/16  8:56 AM  Result Value Ref Range   Lactic Acid, Venous  1.7 0.5 - 1.9 mmol/L  CBG monitoring, ED     Status: Abnormal   Collection Time: 07/08/16  9:07 AM  Result  Value Ref Range   Glucose-Capillary 111 (H) 65 - 99 mg/dL  Glucose, capillary     Status: None   Collection Time: 07/09/16  7:40 AM  Result Value Ref Range   Glucose-Capillary 97 65 - 99 mg/dL    ABGS No results for input(s): PHART, PO2ART, TCO2, HCO3 in the last 72 hours.  Invalid input(s): PCO2 CULTURES Recent Results (from the past 240 hour(s))  Blood Culture (routine x 2)     Status: None (Preliminary result)   Collection Time: 07/08/16  2:38 AM  Result Value Ref Range Status   Specimen Description BLOOD RIGHT ANTECUBITAL  Final   Special Requests BOTTLES DRAWN AEROBIC AND ANAEROBIC 6CC EACH  Final   Culture NO GROWTH < 12 HOURS  Final   Report Status PENDING  Incomplete  Blood Culture (routine x 2)     Status: None (Preliminary result)   Collection Time: 07/08/16  2:43 AM  Result Value Ref Range Status   Specimen Description BLOOD RIGHT WRIST  Final   Special Requests   Final    BOTTLES DRAWN AEROBIC AND ANAEROBIC AEB 6CC ANA 4CC   Culture NO GROWTH < 12 HOURS  Final   Report Status PENDING  Incomplete   Studies/Results: Dg Chest Portable 1 View  Result Date: 07/08/2016 CLINICAL DATA:  Awoke today with shortness of breath.  Fever. EXAM: PORTABLE CHEST 1 VIEW COMPARISON:  Radiographs 06/10/2016.  Chest CT 12/25/2015 FINDINGS: Emphysematous change. No focal airspace disease. No pulmonary edema. Normal heart size and mediastinal contours. No pleural fluid or pneumothorax. No acute osseous abnormalities are seen. IMPRESSION: Emphysema without acute abnormality. Electronically Signed   By: Jeb Levering M.D.   On: 07/08/2016 03:13    Medications: I have reviewed the patient's current medications.  Assesment:  Principal Problem:   Sepsis ([place]) Active Problems:   Essential hypertension   Malignant neoplasm of rectum (HCC)   Fournier's gangrene in male   History of DVT (deep vein thrombosis)   Paroxysmal atrial fibrillation (HCC)   SOB (shortness of breath)   Sepsis  secondary to UTI [facility])    Plan:  Medications reviewed Continue combinations of IV antibiotics for now Will follow culture results CBC and BMP in Am.    LOS: 1 day   Taleen Prosser 07/09/2016, 8:13 AM

## 2016-07-09 NOTE — Care Management Note (Signed)
Case Management Note  Patient Details  Name: Juan Wheeler MRN: [id number] Date of Birth: May 19, 1961  Subjective/Objective:   Patient adm from home with sepsis. He lives with wife and is ind with ADL's. He is currently active with [facility] for [facility] RN services. He has a PCP, transportation and no issues affording medications.                 Action/Plan: Notified Abby of [facility], who will obtain resumption orders from chart. Will notify of DC.    Expected Discharge Date:          07/10/2016        Expected Discharge Plan:  [place]  In-House Referral:  NA  Discharge planning Services  CM Consult  Post Acute Care Choice:  Home Health, Resumption of Svcs/PTA Provider Choice offered to:  Patient  DME Arranged:    DME Agency:     HH Arranged:  RN [place] Agency:  [facility]  Status of Service:  Completed, signed off  If discussed at [employer] of Stay Meetings, dates discussed:    Additional Comments:  Lanard Arguijo, Chauncey [place], RN 07/09/2016, 12:44 PM

## 2016-07-10 LAB — BASIC METABOLIC PANEL
Anion gap: 6 (ref 5–15)
BUN: 7 mg/dL (ref 6–20)
CO2: 25 mmol/L (ref 22–32)
Calcium: 9 mg/dL (ref 8.9–10.3)
Chloride: 107 mmol/L (ref 101–111)
Creatinine, Ser: 1 mg/dL (ref 0.61–1.24)
GFR calc Af Amer: >60 mL/min (ref >60)
GFR calc non Af Amer: >60 mL/min (ref >60)
Glucose, Bld: 103 mg/dL — ABNORMAL HIGH (ref 65–99)
Potassium: 3.9 mmol/L (ref 3.5–5.1)
Sodium: 138 mmol/L (ref 135–145)

## 2016-07-10 LAB — CBC
HCT: 36 % — ABNORMAL LOW (ref 39.0–52.0)
Hemoglobin: 12.1 g/dL — ABNORMAL LOW (ref 13.0–17.0)
MCH: 31.4 pg (ref 26.0–34.0)
MCHC: 33.6 g/dL (ref 30.0–36.0)
MCV: 93.5 fL (ref 78.0–100.0)
Platelets: 205 10*3/uL (ref 150–400)
RBC: 3.85 MIL/uL — ABNORMAL LOW (ref 4.22–5.81)
RDW: 13.5 % (ref 11.5–15.5)
WBC: 7.3 10*3/uL (ref 4.0–10.5)

## 2016-07-10 LAB — GLUCOSE, CAPILLARY: Glucose-Capillary: 116 mg/dL — ABNORMAL HIGH (ref 65–99)

## 2016-07-10 NOTE — Progress Notes (Signed)
Subjective: Patient feels better. His fever has subsided and leucocytosis improvied. Culture results Pending.  Objective: Vital signs in last 24 hours: Temp:  [97.8 F (36.6 C)-98.6 F (37 C)] 98.6 F (37 C) (10/13 0705) Pulse Rate:  [63-71] 63 (10/13 0705) Resp:  [18-20] 18 (10/13 0705) BP: (119-150)/(76-83) 129/76 (10/13 0705) SpO2:  [97 %-100 %] 97 % (10/13 0705) Weight:  [75.9 kg (167 lb 4.8 oz)] 75.9 kg (167 lb 4.8 oz) (10/13 0705) Weight change:  Last BM Date: 07/08/16  Intake/Output from previous day: 10/12 0701 - 10/13 0700 In: 360 [P.O.:360] Out: 1200 [Urine:1200]  PHYSICAL EXAM General appearance: alert and no distress Resp: clear to auscultation bilaterally Cardio: S1, S2 normal GI: soft, non-tender; bowel sounds normal; no masses,  no organomegaly Extremities: extremities normal, atraumatic, no cyanosis or edema  Lab Results:  Results for orders placed or performed during the hospital encounter of 07/08/16 (from the past 48 hour(s))  Lactic acid, plasma     Status: None   Collection Time: 07/08/16  8:56 AM  Result Value Ref Range   Lactic Acid, Venous 1.7 0.5 - 1.9 mmol/L  CBG monitoring, ED     Status: Abnormal   Collection Time: 07/08/16  9:07 AM  Result Value Ref Range   Glucose-Capillary 111 (H) 65 - 99 mg/dL  Glucose, capillary     Status: None   Collection Time: 07/09/16  7:40 AM  Result Value Ref Range   Glucose-Capillary 97 65 - 99 mg/dL  Basic metabolic panel     Status: Abnormal   Collection Time: 07/10/16  6:31 AM  Result Value Ref Range   Sodium 138 135 - 145 mmol/L   Potassium 3.9 3.5 - 5.1 mmol/L   Chloride 107 101 - 111 mmol/L   CO2 25 22 - 32 mmol/L   Glucose, Bld 103 (H) 65 - 99 mg/dL   BUN 7 6 - 20 mg/dL   Creatinine, Ser 1.00 0.61 - 1.24 mg/dL   Calcium 9.0 8.9 - 10.3 mg/dL   GFR calc non Af Amer >60 >60 mL/min   GFR calc Af Amer >60 >60 mL/min    Comment: (NOTE) The eGFR has been calculated using the CKD EPI equation. This  calculation has not been validated in all clinical situations. eGFR's persistently <60 mL/min signify possible Chronic Kidney Disease.    Anion gap 6 5 - 15  CBC     Status: Abnormal   Collection Time: 07/10/16  6:31 AM  Result Value Ref Range   WBC 7.3 4.0 - 10.5 K/uL   RBC 3.85 (L) 4.22 - 5.81 MIL/uL   Hemoglobin 12.1 (L) 13.0 - 17.0 g/dL   HCT 36.0 (L) 39.0 - 52.0 %   MCV 93.5 78.0 - 100.0 fL   MCH 31.4 26.0 - 34.0 pg   MCHC 33.6 30.0 - 36.0 g/dL   RDW 13.5 11.5 - 15.5 %   Platelets 205 150 - 400 K/uL    ABGS No results for input(s): PHART, PO2ART, TCO2, HCO3 in the last 72 hours.  Invalid input(s): PCO2 CULTURES Recent Results (from the past 240 hour(s))  Blood Culture (routine x 2)     Status: None (Preliminary result)   Collection Time: 07/08/16  2:38 AM  Result Value Ref Range Status   Specimen Description BLOOD RIGHT ANTECUBITAL DRAWN BY RN Y.W.  Final   Special Requests BOTTLES DRAWN AEROBIC AND ANAEROBIC [place]  Final   Culture NO GROWTH 1 DAY  Final   Report  Status PENDING  Incomplete  Blood Culture (routine x 2)     Status: None (Preliminary result)   Collection Time: 07/08/16  2:43 AM  Result Value Ref Range Status   Specimen Description BLOOD RIGHT WRIST DRAWN BY RN C.V.  Final   Special Requests   Final    BOTTLES DRAWN AEROBIC AND ANAEROBIC AEB=6CC ANA=4CC   Culture NO GROWTH 1 DAY  Final   Report Status PENDING  Incomplete  Urine culture     Status: Abnormal   Collection Time: 07/08/16  3:42 AM  Result Value Ref Range Status   Specimen Description URINE, CLEAN CATCH  Final   Special Requests NONE  Final   Culture (A)  Final    <10,000 COLONIES/mL INSIGNIFICANT GROWTH Performed at [facility]    Report Status 07/09/2016 FINAL  Final   Studies/Results: No results found.  Medications: I have reviewed the patient's current medications.  Assesment:  Principal Problem:   Sepsis ([place]) Active Problems:   Essential hypertension    Malignant neoplasm of rectum (HCC)   Fournier's gangrene in male   History of DVT (deep vein thrombosis)   Paroxysmal atrial fibrillation (HCC)   SOB (shortness of breath)   Sepsis secondary to UTI [facility])    Plan:  Medications reviewed Continue combinations of IV antibiotics for now until blood culture result is available Will follow culture results Continue regular treatment    LOS: 2 days   Dennis Killilea 07/10/2016, 8:16 AM

## 2016-07-10 NOTE — Care Management Important Message (Signed)
Important Message  Patient Details  Name: Juan Wheeler MRN: [id number] Date of Birth: Nov 23, 1960   Medicare Important Message Given:  Yes    Devonte Migues, Chauncey [place], RN 07/10/2016, 5:29 PM

## 2016-07-11 LAB — GLUCOSE, CAPILLARY: Glucose-Capillary: 110 mg/dL — ABNORMAL HIGH (ref 65–99)

## 2016-07-11 NOTE — Progress Notes (Signed)
Pharmacy Antibiotic Note  Juan Wheeler is a 55 y.o. male admitted on 07/08/2016 with UTI / sepsis.   Pharmacy has been consulted for CEFEPIME dosing. Cultures are negative to date  Plan: Cefepime 1gm IV q8h Monitor labs, progress, c/s  Height: '5\' 9"'$  (175.3 cm) Weight: 167 lb 4.8 oz (75.9 kg) IBW/kg (Calculated) : 70.7  Temp (24hrs), Avg:98.4 F (36.9 C), Min:98.4 F (36.9 C), Max:98.4 F (36.9 C)   Recent Labs Lab 07/08/16 0238 07/08/16 0300 07/08/16 0524 07/08/16 0856 07/10/16 0631  WBC 12.1*  --   --   --  7.3  CREATININE 1.25*  --   --   --  1.00  LATICACIDVEN  --  4.67* 1.8 1.7  --     Estimated Creatinine Clearance: 82.5 mL/min (by C-G formula based on SCr of 1 mg/dL).    Allergies  Allergen Reactions  . Darvocet [Propoxyphene N-Acetaminophen] Palpitations  . Propoxyphene Palpitations   Antimicrobials this admission: Cefepime 10/11 >>  Zosyn 10/11 x 1 dose  Vancomycin 10/11 x 1 dose  Dose adjustments this admission:  Microbiology results: 10/11 BCx: pending 10/11 UCx: insign growth  Thank you for allowing pharmacy to be a part of this patient's care.  [name] Poteet 07/11/2016 9:09 AM

## 2016-07-11 NOTE — Progress Notes (Signed)
Subjective: Patient fis resting. He feels berter, His fever has subsided. His culture is negative so far. Objective: Vital signs in last 24 hours: Temp:  [98.4 F (36.9 C)] 98.4 F (36.9 C) (10/13 2155) Pulse Rate:  [71] 71 (10/13 2155) Resp:  [16] 16 (10/13 2155) BP: (148)/(76) 148/76 (10/13 2155) SpO2:  [97 %] 97 % (10/13 2155) Weight change:  Last BM Date: 07/10/16  Intake/Output from previous day: 10/13 0701 - 10/14 0700 In: 650 [P.O.:600; IV Piggyback:50] Out: 700 [Urine:700]  PHYSICAL EXAM General appearance: alert and no distress Resp: clear to auscultation bilaterally Cardio: S1, S2 normal GI: soft, non-tender; bowel sounds normal; no masses,  no organomegaly Extremities: extremities normal, atraumatic, no cyanosis or edema  Lab Results:  Results for orders placed or performed during the hospital encounter of 07/08/16 (from the past 48 hour(s))  Basic metabolic panel     Status: Abnormal   Collection Time: 07/10/16  6:31 AM  Result Value Ref Range   Sodium 138 135 - 145 mmol/L   Potassium 3.9 3.5 - 5.1 mmol/L   Chloride 107 101 - 111 mmol/L   CO2 25 22 - 32 mmol/L   Glucose, Bld 103 (H) 65 - 99 mg/dL   BUN 7 6 - 20 mg/dL   Creatinine, Ser 1.00 0.61 - 1.24 mg/dL   Calcium 9.0 8.9 - 10.3 mg/dL   GFR calc non Af Amer >60 >60 mL/min   GFR calc Af Amer >60 >60 mL/min    Comment: (NOTE) The eGFR has been calculated using the CKD EPI equation. This calculation has not been validated in all clinical situations. eGFR's persistently <60 mL/min signify possible Chronic Kidney Disease.    Anion gap 6 5 - 15  CBC     Status: Abnormal   Collection Time: 07/10/16  6:31 AM  Result Value Ref Range   WBC 7.3 4.0 - 10.5 K/uL   RBC 3.85 (L) 4.22 - 5.81 MIL/uL   Hemoglobin 12.1 (L) 13.0 - 17.0 g/dL   HCT 36.0 (L) 39.0 - 52.0 %   MCV 93.5 78.0 - 100.0 fL   MCH 31.4 26.0 - 34.0 pg   MCHC 33.6 30.0 - 36.0 g/dL   RDW 13.5 11.5 - 15.5 %   Platelets 205 150 - 400 K/uL   Glucose, capillary     Status: Abnormal   Collection Time: 07/10/16  8:24 AM  Result Value Ref Range   Glucose-Capillary 116 (H) 65 - 99 mg/dL   Comment 1 Document in Chart   Glucose, capillary     Status: Abnormal   Collection Time: 07/11/16  7:29 AM  Result Value Ref Range   Glucose-Capillary 110 (H) 65 - 99 mg/dL   Comment 1 Notify RN    Comment 2 Document in Chart     ABGS No results for input(s): PHART, PO2ART, TCO2, HCO3 in the last 72 hours.  Invalid input(s): PCO2 CULTURES Recent Results (from the past 240 hour(s))  Blood Culture (routine x 2)     Status: None (Preliminary result)   Collection Time: 07/08/16  2:38 AM  Result Value Ref Range Status   Specimen Description BLOOD RIGHT ANTECUBITAL DRAWN BY RN Y.W.  Final   Special Requests BOTTLES DRAWN AEROBIC AND ANAEROBIC [place]  Final   Culture NO GROWTH 3 DAYS  Final   Report Status PENDING  Incomplete  Blood Culture (routine x 2)     Status: None (Preliminary result)   Collection Time: 07/08/16  2:43 AM  Result Value Ref Range Status   Specimen Description BLOOD RIGHT WRIST DRAWN BY RN C.V.  Final   Special Requests   Final    BOTTLES DRAWN AEROBIC AND ANAEROBIC AEB=6CC ANA=4CC   Culture NO GROWTH 3 DAYS  Final   Report Status PENDING  Incomplete  Urine culture     Status: Abnormal   Collection Time: 07/08/16  3:42 AM  Result Value Ref Range Status   Specimen Description URINE, CLEAN CATCH  Final   Special Requests NONE  Final   Culture (A)  Final    <10,000 COLONIES/mL INSIGNIFICANT GROWTH Performed at [facility]    Report Status 07/09/2016 FINAL  Final   Studies/Results: No results found.  Medications: I have reviewed the patient's current medications.  Assesment:  Principal Problem:   Sepsis ([place]) Active Problems:   Essential hypertension   Malignant neoplasm of rectum (HCC)   Fournier's gangrene in male   History of DVT (deep vein thrombosis)   Paroxysmal atrial fibrillation (HCC)    SOB (shortness of breath)   Sepsis secondary to UTI [facility])    Plan:  Medications reviewed Continue Cefepime for now If continue improve will discharge on oral antibiotics    LOS: 3 days   Avid Guillette 07/11/2016, 11:55 AM

## 2016-07-12 LAB — GLUCOSE, CAPILLARY: Glucose-Capillary: 106 mg/dL — ABNORMAL HIGH (ref 65–99)

## 2016-07-12 MED ORDER — AMOXICILLIN-POT CLAVULANATE 500-125 MG PO TABS: 1.0000 | ORAL_TABLET | Freq: Three times a day (TID) | ORAL | 0 refills | Status: DC

## 2016-07-12 NOTE — Discharge Summary (Signed)
Physician Discharge Summary  Patient ID: Juan Wheeler MRN: [id number] DOB/AGE: 56/09/1960 56 y.o. [place], MD Admit date: 07/08/2016 Discharge date: 07/12/2016    Discharge Diagnoses:   Principal Problem:   Sepsis [facility]) Active Problems:   Essential hypertension   Malignant neoplasm of rectum ([place])   Fournier's gangrene in male   History of DVT (deep vein thrombosis)   Paroxysmal atrial fibrillation (HCC)   SOB (shortness of breath)   Sepsis secondary to UTI ([place])     Medication List    TAKE these medications   amoxicillin-clavulanate 500-125 MG tablet Commonly known as:  AUGMENTIN Take 1 tablet (500 mg total) by mouth 3 (three) times daily.   apixaban 5 MG Tabs tablet Commonly known as:  ELIQUIS Take 1 tablet (5 mg total) by mouth 2 (two) times daily.   ascorbic acid 500 MG tablet Commonly known as:  VITAMIN C Take 1 tablet (500 mg total) by mouth daily.   isosorbide mononitrate 30 MG 24 hr tablet Commonly known as:  IMDUR TAKE 1/2 TABLET BY MOUTH ONCE DAILY. What changed:  how much to take  how to take this  when to take this  additional instructions   lovastatin 10 MG tablet Commonly known as:  MEVACOR Take 1 tablet (10 mg total) by mouth at bedtime.   metoprolol tartrate 25 MG tablet Commonly known as:  LOPRESSOR Take 1 tablet (25 mg total) by mouth 2 (two) times daily.   nitroGLYCERIN 0.4 MG SL tablet Commonly known as:  NITROSTAT PLACE 1 TABLET UNDER TONGUE IF NEEDED FOR CHEST PAIN..UP TO 3 DOSES   omeprazole 20 MG capsule Commonly known as:  PRILOSEC Take 1 capsule by mouth daily.   oxyCODONE 5 MG immediate release tablet Commonly known as:  Oxy IR/ROXICODONE Take 1-2 tablets (5-10 mg total) by mouth every 6 (six) hours as needed for moderate pain.   sulfamethoxazole-trimethoprim 800-160 MG tablet Commonly known as:  BACTRIM DS,SEPTRA DS Take 1 tablet by mouth 2 (two) times daily. Starting 07/07/2016 x 5  days.   zinc sulfate 220 (50 Zn) MG capsule Take 1 capsule (220 mg total) by mouth daily.       Discharged Condition: improved    Consults: none  Significant Diagnostic Studies: Dg Chest Portable 1 View  Result Date: 07/08/2016 CLINICAL DATA:  Awoke today with shortness of breath.  Fever. EXAM: PORTABLE CHEST 1 VIEW COMPARISON:  Radiographs 06/10/2016.  Chest CT 12/25/2015 FINDINGS: Emphysematous change. No focal airspace disease. No pulmonary edema. Normal heart size and mediastinal contours. No pleural fluid or pneumothorax. No acute osseous abnormalities are seen. IMPRESSION: Emphysema without acute abnormality. Electronically Signed   By: Jeb Levering M.D.   On: 07/08/2016 03:13    Lab Results: Basic Metabolic Panel:  Recent Labs  07/10/16 0631  NA 138  K 3.9  CL 107  CO2 25  GLUCOSE 103*  BUN 7  CREATININE 1.00  CALCIUM 9.0   Liver Function Tests: No results for input(s): AST, ALT, ALKPHOS, BILITOT, PROT, ALBUMIN in the last 72 hours.   CBC:  Recent Labs  07/10/16 0631  WBC 7.3  HGB 12.1*  HCT 36.0*  MCV 93.5  PLT 205    Recent Results (from the past 240 hour(s))  Blood Culture (routine x 2)     Status: None (Preliminary result)   Collection Time: 07/08/16  2:38 AM  Result Value Ref Range Status   Specimen Description BLOOD RIGHT ANTECUBITAL DRAWN BY RN Y.W.  Final  Special Requests BOTTLES DRAWN AEROBIC AND ANAEROBIC 6CC EACH  Final   Culture NO GROWTH 4 DAYS  Final   Report Status PENDING  Incomplete  Blood Culture (routine x 2)     Status: None (Preliminary result)   Collection Time: 07/08/16  2:43 AM  Result Value Ref Range Status   Specimen Description BLOOD RIGHT WRIST DRAWN BY RN C.V.  Final   Special Requests   Final    BOTTLES DRAWN AEROBIC AND ANAEROBIC AEB=6CC ANA=4CC   Culture NO GROWTH 4 DAYS  Final   Report Status PENDING  Incomplete  Urine culture     Status: Abnormal   Collection Time: 07/08/16  3:42 AM  Result Value Ref  Range Status   Specimen Description URINE, CLEAN CATCH  Final   Special Requests NONE  Final   Culture (A)  Final    <10,000 COLONIES/mL INSIGNIFICANT GROWTH Performed at [facility]    Report Status 07/09/2016 FINAL  Final     Hospital Course:   This is a 55 years old male patient with history of multiple medical illnesses was admitted due to sepsis. He was started on empirical antibiotics. His fever and leucocytosis improved, Patient felt much better. His urine and blood culture, however remained negative. Patient is discharged on oral antibiotics and he will be followed in office.  Discharge Exam: Blood pressure 90/70, pulse 64, temperature 98.3 F (36.8 C), temperature source Oral, resp. rate 18, height '5\' 9"'$  (1.753 m), weight 75.6 kg (166 lb 9.6 oz), SpO2 100 %.    Disposition:  home    Follow-up Information    Laurice Kimmons, MD Follow up in 1 week(s).   Specialty:  Internal Medicine Contact information: [place] [address] [phone]           Signed: [name]   07/12/2016, 2:09 PM

## 2016-07-13 ENCOUNTER — Encounter (HOSPITAL_COMMUNITY): Payer: Commercial Managed Care - HMO | Attending: Hematology & Oncology | Admitting: Oncology

## 2016-07-13 ENCOUNTER — Encounter (HOSPITAL_COMMUNITY): Payer: Self-pay | Admitting: Oncology

## 2016-07-13 VITALS — BP 102/80 | HR 105 | Temp 98.6°F | Resp 18 | Wt 169.8 lb

## 2016-07-13 DIAGNOSIS — Z8249 Family history of ischemic heart disease and other diseases of the circulatory system: Secondary | ICD-10-CM | POA: Insufficient documentation

## 2016-07-13 DIAGNOSIS — F17210 Nicotine dependence, cigarettes, uncomplicated: Secondary | ICD-10-CM | POA: Insufficient documentation

## 2016-07-13 DIAGNOSIS — C20 Malignant neoplasm of rectum: Secondary | ICD-10-CM

## 2016-07-13 DIAGNOSIS — G894 Chronic pain syndrome: Secondary | ICD-10-CM

## 2016-07-13 DIAGNOSIS — I82409 Acute embolism and thrombosis of unspecified deep veins of unspecified lower extremity: Secondary | ICD-10-CM | POA: Insufficient documentation

## 2016-07-13 DIAGNOSIS — Z9889 Other specified postprocedural states: Secondary | ICD-10-CM | POA: Insufficient documentation

## 2016-07-13 DIAGNOSIS — I739 Peripheral vascular disease, unspecified: Secondary | ICD-10-CM | POA: Insufficient documentation

## 2016-07-13 DIAGNOSIS — R918 Other nonspecific abnormal finding of lung field: Secondary | ICD-10-CM

## 2016-07-13 DIAGNOSIS — Z9221 Personal history of antineoplastic chemotherapy: Secondary | ICD-10-CM | POA: Insufficient documentation

## 2016-07-13 DIAGNOSIS — K219 Gastro-esophageal reflux disease without esophagitis: Secondary | ICD-10-CM | POA: Insufficient documentation

## 2016-07-13 DIAGNOSIS — I10 Essential (primary) hypertension: Secondary | ICD-10-CM | POA: Insufficient documentation

## 2016-07-13 DIAGNOSIS — Z809 Family history of malignant neoplasm, unspecified: Secondary | ICD-10-CM | POA: Insufficient documentation

## 2016-07-13 DIAGNOSIS — I2510 Atherosclerotic heart disease of native coronary artery without angina pectoris: Secondary | ICD-10-CM | POA: Insufficient documentation

## 2016-07-13 LAB — CULTURE, BLOOD (ROUTINE X 2)
Culture: NO GROWTH
Culture: NO GROWTH

## 2016-07-13 MED ORDER — MORPHINE SULFATE ER 60 MG PO TBCR: 60.0000 mg | EXTENDED_RELEASE_TABLET | Freq: Three times a day (TID) | ORAL | 0 refills | Status: DC

## 2016-07-13 MED ORDER — OXYCODONE HCL 10 MG PO TABS: 10.0000 mg | ORAL_TABLET | ORAL | 0 refills | Status: DC | PRN

## 2016-07-13 NOTE — Assessment & Plan Note (Signed)
Pulmonary nodules, stable.  CT chest in March 2017 is stable.  Another CT of chest is recommended in March 2018.

## 2016-07-13 NOTE — Assessment & Plan Note (Signed)
Chronic pain secondary to LAR for rectal cancer resulting in adjuvant treatment post-operatively with chemotherapy to be cancelled.  On MS Contin 60 mg every 8 hours and Oxycodone 10 mg 1-2 tablets every 4 hours PRN pain #100 with excellent pain control.  Advance Controlled Substance Reporting System is reviewed and he received a 2 weeks supply of these pain medications from Sherren Kerns, NP on 07/03/2016.  I will refill these medications today for 1 month supply.  Three months worth of Rxs for aforementioned pain medication provided at today's visit.    Patient also on bowel regimen to maintain BMs and avoid constipation.  He denies any constipation and his bowels are well controlled with stool softeners.  Return in 3 months for follow-up.
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
## 2016-07-13 NOTE — Patient Instructions (Addendum)
[place] at [facility] Discharge Instructions  RECOMMENDATIONS MADE BY THE CONSULTANT AND ANY TEST RESULTS WILL BE SENT TO YOUR REFERRING PHYSICIAN.  You were seen by Gershon Mussel today. Return in 3 months with labs. Prescription given  Thank you for choosing [place] at [facility] to provide your oncology and hematology care.  To afford each patient quality time with our provider, please arrive at least 15 minutes before your scheduled appointment time.   Beginning January 23rd 2017 lab work for the [facility] will be done in the  Main lab at [facility] on 1st floor. If you have a lab appointment with the [facility] please come in thru the  Main Entrance and check in at the main information desk  You need to re-schedule your appointment should you arrive 10 or more minutes late.  We strive to give you quality time with our providers, and arriving late affects you and other patients whose appointments are after yours.  Also, if you no show three or more times for appointments you may be dismissed from the clinic at the providers discretion.     Again, thank you for choosing [facility].  Our hope is that these requests will decrease the amount of time that you wait before being seen by our physicians.       _____________________________________________________________  Should you have questions after your visit to [facility], please contact our office at [phone] between the hours of 8:30 a.m. and 4:30 p.m.  Voicemails left after 4:30 p.m. will not be returned until the following business day.  For prescription refill requests, have your pharmacy contact our office.         Resources For Cancer Patients and their Caregivers ? American Cancer Society: Can assist with transportation, wigs, general needs, runs Look Good Feel Better.        (850) 526-6878 ? Cancer Care: Provides financial assistance, online  support groups, medication/co-pay assistance.  1-800-813-HOPE 303-653-4423) ? [place] Assists [place] cancer patients and their families through emotional , educational and financial support.  (479)658-4747 ? [place] DSS Where to apply for food stamps, Medicaid and utility assistance. 805-231-1287 ? [facility]: Transportation to medical appointments. (203) 728-4671 ? Social Security Administration: May apply for disability if have a Stage IV cancer. (480)622-4902 438-334-7499 ? [employer], Disability and Transit Services: Assists with nutrition, care and transit needs. [place] Support Programs: '@10RELATIVEDAYS'$ @ > Cancer Support Group  2nd Tuesday of the month 1pm-2pm, Journey Room  > Creative Journey  3rd Tuesday of the month 1130am-1pm, Journey Room  > Look Good Feel Better  1st Wednesday of the month 10am-12 noon, Journey Room (Call [place] to register [phone])

## 2016-07-13 NOTE — Assessment & Plan Note (Addendum)
Stage IIA rectal adenocarcinoma (uT3N0 clinical M0), ypT2pN0 after neoadjuvant chemotherapy, diagnosed and treated at [facility].  On initial presentation, his course was complicated or delayed secondary to finding on preoperative imaging showing a tracheal lesion and nasopharynx lesion as well as several subcentimeter pulmonary nodules. ENT evaluation revealed no evidence of neoplastic process in the trachea or nasopharynx and a biopsy of a lung nodule did not show any evidence of malignancy.  He is S/P concurrent chemo-RT with Xeloda as a radiosensitizer at a dose of 1500 mg p.o. B.i.d. Monday through Friday 02/03/2011 through 03/13/2011.  He then underwent a LAR with temporary loop ileostomy by [name] [facility]) on 05/11/2011. Path showed residual invasive adenocarcinoma invasive into the muscular propria margins negative, ypT2pN0.  [facility] initially planned for adjuvant XELOX which we elected to forego as his course was complicated by uncontrolled pain. Has continued on surveillance since.    Chart reviewed.  Recent hospitalization for sepsis secondary to sepsis due to left buttock abscess with fistula to the rectum with necrotizing fasciitis; requiring laparoscopic colostomy by Dr. [name].  NED.    No role for labs today.  Labs performed on 07/10/2016: CBC diff, BMET.  I personally reviewed and went over laboratory results with the patient.  The results are noted within this dictation.  I have printed Rx for his long-acting pain medication and short-acting pain medication.  [place] Controlled Substance Reporting System.    Labs in 3 months: CBC diff, CMET, CEA.  If CEA is normal, will consider discontinuing monitoring of CEA given his Stage II disease.   He has GI follow-up scheduled on 08/10/2016.   Return in 3 months for follow-up.

## 2016-07-15 DIAGNOSIS — G894 Chronic pain syndrome: Secondary | ICD-10-CM | POA: Diagnosis not present

## 2016-07-15 DIAGNOSIS — C20 Malignant neoplasm of rectum: Secondary | ICD-10-CM | POA: Diagnosis not present

## 2016-07-15 DIAGNOSIS — B9689 Other specified bacterial agents as the cause of diseases classified elsewhere: Secondary | ICD-10-CM | POA: Diagnosis not present

## 2016-07-15 DIAGNOSIS — M6281 Muscle weakness (generalized): Secondary | ICD-10-CM | POA: Diagnosis not present

## 2016-07-15 DIAGNOSIS — M726 Necrotizing fasciitis: Secondary | ICD-10-CM | POA: Diagnosis not present

## 2016-07-15 DIAGNOSIS — I2510 Atherosclerotic heart disease of native coronary artery without angina pectoris: Secondary | ICD-10-CM | POA: Diagnosis not present

## 2016-07-15 DIAGNOSIS — I480 Paroxysmal atrial fibrillation: Secondary | ICD-10-CM | POA: Diagnosis not present

## 2016-07-15 DIAGNOSIS — Z433 Encounter for attention to colostomy: Secondary | ICD-10-CM | POA: Diagnosis not present

## 2016-07-15 DIAGNOSIS — K611 Rectal abscess: Secondary | ICD-10-CM | POA: Diagnosis not present

## 2016-07-17 DIAGNOSIS — C20 Malignant neoplasm of rectum: Secondary | ICD-10-CM | POA: Diagnosis not present

## 2016-07-17 DIAGNOSIS — M726 Necrotizing fasciitis: Secondary | ICD-10-CM | POA: Diagnosis not present

## 2016-07-17 DIAGNOSIS — I2510 Atherosclerotic heart disease of native coronary artery without angina pectoris: Secondary | ICD-10-CM | POA: Diagnosis not present

## 2016-07-17 DIAGNOSIS — G894 Chronic pain syndrome: Secondary | ICD-10-CM | POA: Diagnosis not present

## 2016-07-17 DIAGNOSIS — B9689 Other specified bacterial agents as the cause of diseases classified elsewhere: Secondary | ICD-10-CM | POA: Diagnosis not present

## 2016-07-17 DIAGNOSIS — K611 Rectal abscess: Secondary | ICD-10-CM | POA: Diagnosis not present

## 2016-07-17 DIAGNOSIS — I480 Paroxysmal atrial fibrillation: Secondary | ICD-10-CM | POA: Diagnosis not present

## 2016-07-17 DIAGNOSIS — Z433 Encounter for attention to colostomy: Secondary | ICD-10-CM | POA: Diagnosis not present

## 2016-07-17 DIAGNOSIS — M6281 Muscle weakness (generalized): Secondary | ICD-10-CM | POA: Diagnosis not present

## 2016-07-20 DIAGNOSIS — I480 Paroxysmal atrial fibrillation: Secondary | ICD-10-CM | POA: Diagnosis not present

## 2016-07-20 DIAGNOSIS — Z23 Encounter for immunization: Secondary | ICD-10-CM | POA: Diagnosis not present

## 2016-07-20 DIAGNOSIS — I2510 Atherosclerotic heart disease of native coronary artery without angina pectoris: Secondary | ICD-10-CM | POA: Diagnosis not present

## 2016-07-20 DIAGNOSIS — C20 Malignant neoplasm of rectum: Secondary | ICD-10-CM | POA: Diagnosis not present

## 2016-07-20 DIAGNOSIS — B9689 Other specified bacterial agents as the cause of diseases classified elsewhere: Secondary | ICD-10-CM | POA: Diagnosis not present

## 2016-07-20 DIAGNOSIS — Z86718 Personal history of other venous thrombosis and embolism: Secondary | ICD-10-CM | POA: Diagnosis not present

## 2016-07-20 DIAGNOSIS — M6281 Muscle weakness (generalized): Secondary | ICD-10-CM | POA: Diagnosis not present

## 2016-07-20 DIAGNOSIS — K611 Rectal abscess: Secondary | ICD-10-CM | POA: Diagnosis not present

## 2016-07-20 DIAGNOSIS — G894 Chronic pain syndrome: Secondary | ICD-10-CM | POA: Diagnosis not present

## 2016-07-20 DIAGNOSIS — Z433 Encounter for attention to colostomy: Secondary | ICD-10-CM | POA: Diagnosis not present

## 2016-07-20 DIAGNOSIS — M726 Necrotizing fasciitis: Secondary | ICD-10-CM | POA: Diagnosis not present

## 2016-07-20 DIAGNOSIS — A419 Sepsis, unspecified organism: Secondary | ICD-10-CM | POA: Diagnosis not present

## 2016-07-23 DIAGNOSIS — M726 Necrotizing fasciitis: Secondary | ICD-10-CM | POA: Diagnosis not present

## 2016-07-23 DIAGNOSIS — C20 Malignant neoplasm of rectum: Secondary | ICD-10-CM | POA: Diagnosis not present

## 2016-07-23 DIAGNOSIS — I480 Paroxysmal atrial fibrillation: Secondary | ICD-10-CM | POA: Diagnosis not present

## 2016-07-23 DIAGNOSIS — G894 Chronic pain syndrome: Secondary | ICD-10-CM | POA: Diagnosis not present

## 2016-07-23 DIAGNOSIS — Z433 Encounter for attention to colostomy: Secondary | ICD-10-CM | POA: Diagnosis not present

## 2016-07-23 DIAGNOSIS — M6281 Muscle weakness (generalized): Secondary | ICD-10-CM | POA: Diagnosis not present

## 2016-07-23 DIAGNOSIS — K611 Rectal abscess: Secondary | ICD-10-CM | POA: Diagnosis not present

## 2016-07-23 DIAGNOSIS — I2510 Atherosclerotic heart disease of native coronary artery without angina pectoris: Secondary | ICD-10-CM | POA: Diagnosis not present

## 2016-07-23 DIAGNOSIS — B9689 Other specified bacterial agents as the cause of diseases classified elsewhere: Secondary | ICD-10-CM | POA: Diagnosis not present

## 2016-07-30 DIAGNOSIS — B9689 Other specified bacterial agents as the cause of diseases classified elsewhere: Secondary | ICD-10-CM | POA: Diagnosis not present

## 2016-07-30 DIAGNOSIS — C20 Malignant neoplasm of rectum: Secondary | ICD-10-CM | POA: Diagnosis not present

## 2016-07-30 DIAGNOSIS — M726 Necrotizing fasciitis: Secondary | ICD-10-CM | POA: Diagnosis not present

## 2016-07-30 DIAGNOSIS — I480 Paroxysmal atrial fibrillation: Secondary | ICD-10-CM | POA: Diagnosis not present

## 2016-07-30 DIAGNOSIS — K611 Rectal abscess: Secondary | ICD-10-CM | POA: Diagnosis not present

## 2016-07-30 DIAGNOSIS — G894 Chronic pain syndrome: Secondary | ICD-10-CM | POA: Diagnosis not present

## 2016-07-30 DIAGNOSIS — M6281 Muscle weakness (generalized): Secondary | ICD-10-CM | POA: Diagnosis not present

## 2016-07-30 DIAGNOSIS — Z433 Encounter for attention to colostomy: Secondary | ICD-10-CM | POA: Diagnosis not present

## 2016-07-30 DIAGNOSIS — I2510 Atherosclerotic heart disease of native coronary artery without angina pectoris: Secondary | ICD-10-CM | POA: Diagnosis not present

## 2016-08-04 DIAGNOSIS — B9689 Other specified bacterial agents as the cause of diseases classified elsewhere: Secondary | ICD-10-CM | POA: Diagnosis not present

## 2016-08-04 DIAGNOSIS — I2510 Atherosclerotic heart disease of native coronary artery without angina pectoris: Secondary | ICD-10-CM | POA: Diagnosis not present

## 2016-08-04 DIAGNOSIS — Z433 Encounter for attention to colostomy: Secondary | ICD-10-CM | POA: Diagnosis not present

## 2016-08-04 DIAGNOSIS — I480 Paroxysmal atrial fibrillation: Secondary | ICD-10-CM | POA: Diagnosis not present

## 2016-08-04 DIAGNOSIS — K611 Rectal abscess: Secondary | ICD-10-CM | POA: Diagnosis not present

## 2016-08-04 DIAGNOSIS — M726 Necrotizing fasciitis: Secondary | ICD-10-CM | POA: Diagnosis not present

## 2016-08-04 DIAGNOSIS — G894 Chronic pain syndrome: Secondary | ICD-10-CM | POA: Diagnosis not present

## 2016-08-04 DIAGNOSIS — C20 Malignant neoplasm of rectum: Secondary | ICD-10-CM | POA: Diagnosis not present

## 2016-08-04 DIAGNOSIS — M6281 Muscle weakness (generalized): Secondary | ICD-10-CM | POA: Diagnosis not present

## 2016-08-10 ENCOUNTER — Encounter: Payer: Self-pay | Admitting: Gastroenterology

## 2016-08-10 ENCOUNTER — Ambulatory Visit (INDEPENDENT_AMBULATORY_CARE_PROVIDER_SITE_OTHER): Payer: Commercial Managed Care - HMO | Admitting: Gastroenterology

## 2016-08-10 VITALS — BP 141/86 | HR 86 | Temp 98.4°F | Ht 69.0 in | Wt 179.8 lb

## 2016-08-10 DIAGNOSIS — G894 Chronic pain syndrome: Secondary | ICD-10-CM | POA: Diagnosis not present

## 2016-08-10 DIAGNOSIS — I2510 Atherosclerotic heart disease of native coronary artery without angina pectoris: Secondary | ICD-10-CM | POA: Diagnosis not present

## 2016-08-10 DIAGNOSIS — C20 Malignant neoplasm of rectum: Secondary | ICD-10-CM | POA: Diagnosis not present

## 2016-08-10 DIAGNOSIS — Z433 Encounter for attention to colostomy: Secondary | ICD-10-CM | POA: Diagnosis not present

## 2016-08-10 DIAGNOSIS — M726 Necrotizing fasciitis: Secondary | ICD-10-CM | POA: Diagnosis not present

## 2016-08-10 DIAGNOSIS — K611 Rectal abscess: Secondary | ICD-10-CM | POA: Diagnosis not present

## 2016-08-10 DIAGNOSIS — I480 Paroxysmal atrial fibrillation: Secondary | ICD-10-CM | POA: Diagnosis not present

## 2016-08-10 DIAGNOSIS — M6281 Muscle weakness (generalized): Secondary | ICD-10-CM | POA: Diagnosis not present

## 2016-08-10 DIAGNOSIS — B9689 Other specified bacterial agents as the cause of diseases classified elsewhere: Secondary | ICD-10-CM | POA: Diagnosis not present

## 2016-08-10 NOTE — Patient Instructions (Signed)
1. We will contact you with further instructions regarding possible colonoscopy once I have updated Dr. Gala Romney on recent surgeries and we have discussed coming off of Eliquis with your cardiologist.

## 2016-08-10 NOTE — Progress Notes (Signed)
Primary Care Physician:  [name], MD  Primary Gastroenterologist:    Chief Complaint  Patient presents with  . Colonoscopy    HPI:  Juan Wheeler is a 55 y.o. male here for consideration of colonoscopy. He has a history of stage IIA rectal adenocarcinoma status post chemotherapy, diagnosed and treated at [facility]. Underwent LAR with temporary loop ileostomy by [name] on 05/11/2011 with subsequent closure of ileostomy in March 2013.   Patient had done reasonably well up until around September. He started having rectal pain  out of the ordinary. Unfortunately he developed septic shock due to necrotizing fasciitis from extensive left buttock abscess. Initially admitted to [facility] for emergent incision and drainage by Dr. Aviva Signs, 06/05/2016. Transferred to [facility] for further management. Patient underwent a second I&D by Dr. Greer Pickerel on 06/07/2016. Unfortunately after the second debridement patient began passing large amounts of stool through the bite ocular wound consistent with fistula formation. Patient underwent diverting laparoscopic colostomy on 06/11/2016 by Dr. [name]. Patient finally discharged after 11 days of hospitalization. The following month he was readmitted at [facility] with sepsis unclear etiology.  Notable patient started on Eliquis 05/2016 for new onset Afib. He does have h/o DVT in 2013.   Patient trying to heal from recent events. Still having to pack left buttocks wound. Doing well with regards to colostomy. Output is good. No blood or black stools. He denies rectal drainage. Appetite is good. No vomiting. Weight is up 10 pounds in the past month, back to baseline. Patient denies abdominal pain.  Patient's last colonoscopy was in September 2013 by Dr. Lenise Arena. Colorectal anastomosis noted in the rectum with some inflammation and small ulcer noted. Biopsy negative. Recommended repeat colonoscopy in 3 years.  Patient states that his  rectal cancer was diagnosed and treated at [facility], he was living in [place] at the time. Now he wants to keep his care here locally.   Current Outpatient Prescriptions  Medication Sig Dispense Refill  . apixaban (ELIQUIS) 5 MG TABS tablet Take 1 tablet (5 mg total) by mouth 2 (two) times daily.    . isosorbide mononitrate (IMDUR) 30 MG 24 hr tablet TAKE 1/2 TABLET BY MOUTH ONCE DAILY. (Patient taking differently: Take 15 mg by mouth daily. ) 15 tablet 3  . lovastatin (MEVACOR) 10 MG tablet Take 1 tablet (10 mg total) by mouth at bedtime. 30 tablet 11  . metoprolol tartrate (LOPRESSOR) 25 MG tablet Take 1 tablet (25 mg total) by mouth 2 (two) times daily.    Marland Kitchen morphine (MS CONTIN) 60 MG 12 hr tablet Take 1 tablet (60 mg total) by mouth every 8 (eight) hours. 90 tablet 0  . nitroGLYCERIN (NITROSTAT) 0.4 MG SL tablet PLACE 1 TABLET UNDER TONGUE IF NEEDED FOR CHEST PAIN..UP TO 3 DOSES 25 tablet 3  . omeprazole (PRILOSEC) 20 MG capsule Take 1 capsule by mouth daily.    . Oxycodone HCl 10 MG TABS Take 1-2 tablets (10-20 mg total) by mouth every 4 (four) hours as needed. 100 tablet 0  . sulfamethoxazole-trimethoprim (BACTRIM DS,SEPTRA DS) 800-160 MG tablet Take 1 tablet by mouth 2 (two) times daily. Starting 07/07/2016 x 5 days.    . vitamin C (VITAMIN C) 500 MG tablet Take 1 tablet (500 mg total) by mouth daily.    Marland Kitchen zinc sulfate 220 (50 Zn) MG capsule Take 1 capsule (220 mg total) by mouth daily.     No current facility-administered medications for this visit.  Allergies as of 08/10/2016 - Review Complete 08/10/2016  Allergen Reaction Noted  . Darvocet [propoxyphene n-acetaminophen] Palpitations 02/26/2012  . Propoxyphene Palpitations 10/24/2015    Past Medical History:  Diagnosis Date  . Chronic lower back pain    a. Followed by pain management at [facility].  . Colon cancer [facility])    rectal cancer  . Coronary artery disease    a. 03/2013: abnl nuc -> LHC s/p DES to LCx, residual  moderate disease in LAD (med rx unless refractory angina). b. Not on BB due to bradycardia.  Marland Kitchen DVT (deep venous thrombosis) ([place]) ~ 2013  . GERD (gastroesophageal reflux disease)   . History of blood transfusion    "once; after throwing up alot of blood" (04/17/2013)  . Hypertension   . LV dysfunction    a. EF 45% in 03/2013.  Marland Kitchen PAD (peripheral artery disease) ([place])    a. Occlusion of the right internal iliac artery, with significant atherosclerosis in the left internal iliac which was not amenable to reconstruction per notes from [facility].  . Pulmonary nodules 09/30/2014  . Rectal cancer ([place])   . Tobacco abuse     Past Surgical History:  Procedure Laterality Date  . ABDOMINAL SURGERY  1990's   'for stomach ulcers" (04/17/2013)  . COLECTOMY  2012   "for rectal cancer" (04/17/2013)  . COLONOSCOPY  2013   Dr. Cheryll Cockayne: colorectal anastomosis with ulcer and inflammation, benign biopsy  . COLOSTOMY TAKEDOWN  2013  . CORONARY ANGIOPLASTY WITH STENT PLACEMENT  04/17/2013   "?1" (04/17/2013)  . FEMORAL-POPLITEAL BYPASS GRAFT Left 07/02/2015   Procedure: BYPASS GRAFT LEFT COMMON FEMORAL ARTERY TO LEFT ABOVE KNEE POPLITEAL ARTERY - USING LEFT GREATER SAPPHENOUS VEIN;  Surgeon: Elam Dutch, MD;  Location: [place];  Service: Vascular;  Laterality: Left;  . INCISION AND DRAINAGE ABSCESS Left 06/05/2016   Procedure: INCISION AND DRAINAGE ABSCESS;  Surgeon: Aviva Signs, MD;  Location: [facility];  Service: General;  Laterality: Left;  . INCISION AND DRAINAGE PERIRECTAL ABSCESS Left 06/07/2016   Procedure: IRRIGATION AND DEBRIDEMENT LEFT BUTTOCK ABSCESS;  Surgeon: Greer Pickerel, MD;  Location: [place];  Service: General;  Laterality: Left;  . INGUINAL HERNIA REPAIR Bilateral 1990's  . LAPAROSCOPIC PARTIAL COLECTOMY N/A 06/11/2016   Procedure: LAPAROSCOPIC  OPEN COLOSTOMY;  Surgeon: [name], MD;  Location: [place];  Service: General;  Laterality: N/A;  . LEFT HEART CATHETERIZATION WITH CORONARY  ANGIOGRAM N/A 04/17/2013   Procedure: LEFT HEART CATHETERIZATION WITH CORONARY ANGIOGRAM;  Surgeon: Peter M Martinique, MD;  Location: [facility] CATH LAB;  Service: Cardiovascular;  Laterality: N/A;  . PERCUTANEOUS STENT INTERVENTION  04/17/2013   Procedure: PERCUTANEOUS STENT INTERVENTION;  Surgeon: Peter M Martinique, MD;  Location: [facility] CATH LAB;  Service: Cardiovascular;;  . PERIPHERAL VASCULAR CATHETERIZATION N/A 06/14/2015   Procedure: Abdominal Aortogram;  Surgeon: Elam Dutch, MD;  Location: [place] CV LAB;  Service: Cardiovascular;  Laterality: N/A;  . PERIPHERAL VASCULAR CATHETERIZATION Bilateral 06/14/2015   Procedure: Lower Extremity Angiography;  Surgeon: Elam Dutch, MD;  Location: [place] CV LAB;  Service: Cardiovascular;  Laterality: Bilateral;  . VEIN HARVEST Left 07/02/2015   Procedure: VEIN HARVEST - LEFT GREATER SAPPHENOUS VEIN;  Surgeon: Elam Dutch, MD;  Location: [facility] OR;  Service: Vascular;  Laterality: Left;    Family History  Problem Relation Age of Onset  . Cancer Mother   . Hypertension Mother   . Bleeding Disorder Brother     Social History  Social History  . Marital status: Married    Spouse name: N/A  . Number of children: N/A  . Years of education: N/A   Occupational History  . Not on file.   Social History Main Topics  . Smoking status: Light Tobacco Smoker    Packs/day: 0.25    Years: 40.00    Types: Cigarettes    Start date: 03/14/1974  . Smokeless tobacco: Never Used     Comment: 5-6 per day 06/12/15  . Alcohol use 4.2 oz/week    6 Cans of beer, 1 Shots of liquor per week     Comment: 04/17/2013 "bout a 6 pack/wk"  . Drug use:     Types: Marijuana     Comment: about a week ago  . Sexual activity: Yes    Partners: Male    Birth control/ protection: None   Other Topics Concern  . Not on file   Social History Narrative  . No narrative on file      ROS:  General: Negative for anorexia, weight loss, fever, chills, fatigue,  weakness. Eyes: Negative for vision changes.  ENT: Negative for hoarseness, difficulty swallowing , nasal congestion. CV: Negative for chest pain, angina, palpitations, dyspnea on exertion, peripheral edema.  Respiratory: Negative for dyspnea at rest, dyspnea on exertion, cough, sputum, wheezing.  GI: See history of present illness. GU:  Negative for dysuria, hematuria, urinary incontinence, urinary frequency, nocturnal urination.  MS: Negative for joint pain, low back pain.  Derm: Negative for rash or itching.  Neuro: Negative for weakness, abnormal sensation, seizure, frequent headaches, memory loss, confusion.  Psych: Negative for anxiety, depression, suicidal ideation, hallucinations.  Endo: Negative for unusual weight change.  Heme: Negative for bruising or bleeding. Allergy: Negative for rash or hives.    Physical Examination:  BP (!) 141/86   Pulse 86   Temp 98.4 F (36.9 C) (Oral)   Ht '5\' 9"'$  (1.753 m)   Wt 179 lb 12.8 oz (81.6 kg)   BMI 26.55 kg/m    General: Well-nourished, well-developed in no acute distress.  Head: Normocephalic, atraumatic.   Eyes: Conjunctiva pink, no icterus. Mouth: Oropharyngeal mucosa moist and pink , no lesions erythema or exudate. Neck: Supple without thyromegaly, masses, or lymphadenopathy.  Lungs: Clear to auscultation bilaterally.  Heart: Regular rate and rhythm, no murmurs rubs or gallops.  Abdomen: Bowel sounds are normal, nontender, nondistended, no hepatosplenomegaly or masses, no abdominal bruits or    hernia , no rebound or guarding.   Rectal: left buttock with dressing in place. Anal opening appeared normal. DRE with some tenderness right at the opening, small amount of brbpr on gloved finger. Did not appreciate mass. Exam limited by discomfort.  Extremities: No lower extremity edema. No clubbing or deformities.  Neuro: Alert and oriented x 4 , grossly normal neurologically.  Skin: Warm and dry, no rash or jaundice.   Psych: Alert  and cooperative, normal mood and affect.  Labs: Lab Results  Component Value Date   WBC 7.3 07/10/2016   HGB 12.1 (L) 07/10/2016   HCT 36.0 (L) 07/10/2016   MCV 93.5 07/10/2016   PLT 205 07/10/2016   Lab Results  Component Value Date   CREATININE 1.00 07/10/2016   BUN 7 07/10/2016   NA 138 07/10/2016   K 3.9 07/10/2016   CL 107 07/10/2016   CO2 25 07/10/2016   Lab Results  Component Value Date   ALT 29 07/08/2016   AST 25 07/08/2016   ALKPHOS 96  07/08/2016   BILITOT 0.6 07/08/2016   Lab Results  Component Value Date   CEA 4.3 06/02/2016     Imaging Studies: No results found.

## 2016-08-11 ENCOUNTER — Encounter: Payer: Self-pay | Admitting: Gastroenterology

## 2016-08-11 NOTE — Assessment & Plan Note (Signed)
55 year old gentleman with stage IIA rectal adenocarcinoma initially diagnosed and treated at [name] in 2012 who presents for consideration of surveillance colonoscopy. Last colonoscopy was in 2013. Advised after that when to come back in 3 years. Noted to have colorectal anastomosis with some inflammation and small ulcer, biopsy benign.  Patient recently had a complicated course involving left buttock abscess resulting in sepsis/necrotizing fasciitis. He had 2 I&D's with eventual diverting colostomy back in September. He continues to undergo packing of his left buttock wound. Otherwise he denies any rectal drainage. Ostomy output is good. His rectum has not been evaluated endoscopically in this is been advised by surgery. During his last hospitalization. Clinically patient is feeling better. He is now on Eliquis for A.fib started two months ago.   I will discuss case with Dr. Gala Romney. Likely needs flex sig from rectal approach and complete colonoscopy via colostomy to evaluate remaining colon.

## 2016-08-11 NOTE — Progress Notes (Signed)
Please schedule patient for colonoscopy (in OR) via rectum and ostomy. Dx: h/o rectal adenocarcinoma in 2012, recent left buttock abscess with fistula requiring diverting colostomy.  NEEDS TO HOLD ELIQUIS 26 HOURS BEFORE PROCEDURE.  PROCEDURE WITH DEEP SEDATION IN OR GIVEN CHRONIC PAIN MEDICATIONS.

## 2016-08-11 NOTE — Progress Notes (Signed)
CC'D TO PCP °

## 2016-08-12 ENCOUNTER — Other Ambulatory Visit: Payer: Self-pay

## 2016-08-12 ENCOUNTER — Telehealth: Payer: Self-pay

## 2016-08-12 DIAGNOSIS — C20 Malignant neoplasm of rectum: Secondary | ICD-10-CM

## 2016-08-12 DIAGNOSIS — K603 Anal fistula: Secondary | ICD-10-CM

## 2016-08-12 MED ORDER — PEG 3350-KCL-NA BICARB-NACL 420 G PO SOLR: 4000.0000 mL | ORAL | 0 refills | Status: DC

## 2016-08-12 NOTE — Telephone Encounter (Signed)
Called pt and informed of pre-op appt 09/01/16 at 12:45 pm. New instructions printed after talking to LSL about pt's prep. LSL advised pt does not need fleets enema and could stop prep on day of procedure when bm is clear. Informed pt. Reminded to stop Eliquis after taking dose on 09/04/16.

## 2016-08-12 NOTE — Progress Notes (Signed)
Called pt and scheduled colonoscopy for 09/07/16 at 10:00am. Advised to hold Eliquis after taking dose on 09/04/16 (hold 09/05/16 and 09/06/16). Will mail instructions.

## 2016-08-19 DIAGNOSIS — G894 Chronic pain syndrome: Secondary | ICD-10-CM | POA: Diagnosis not present

## 2016-08-19 DIAGNOSIS — I2510 Atherosclerotic heart disease of native coronary artery without angina pectoris: Secondary | ICD-10-CM | POA: Diagnosis not present

## 2016-08-19 DIAGNOSIS — Z433 Encounter for attention to colostomy: Secondary | ICD-10-CM | POA: Diagnosis not present

## 2016-08-19 DIAGNOSIS — C20 Malignant neoplasm of rectum: Secondary | ICD-10-CM | POA: Diagnosis not present

## 2016-08-19 DIAGNOSIS — B9689 Other specified bacterial agents as the cause of diseases classified elsewhere: Secondary | ICD-10-CM | POA: Diagnosis not present

## 2016-08-19 DIAGNOSIS — M726 Necrotizing fasciitis: Secondary | ICD-10-CM | POA: Diagnosis not present

## 2016-08-19 DIAGNOSIS — K611 Rectal abscess: Secondary | ICD-10-CM | POA: Diagnosis not present

## 2016-08-19 DIAGNOSIS — I480 Paroxysmal atrial fibrillation: Secondary | ICD-10-CM | POA: Diagnosis not present

## 2016-08-19 DIAGNOSIS — M6281 Muscle weakness (generalized): Secondary | ICD-10-CM | POA: Diagnosis not present

## 2016-08-27 DIAGNOSIS — M726 Necrotizing fasciitis: Secondary | ICD-10-CM | POA: Diagnosis not present

## 2016-08-27 DIAGNOSIS — B9689 Other specified bacterial agents as the cause of diseases classified elsewhere: Secondary | ICD-10-CM | POA: Diagnosis not present

## 2016-08-27 DIAGNOSIS — Z433 Encounter for attention to colostomy: Secondary | ICD-10-CM | POA: Diagnosis not present

## 2016-08-27 DIAGNOSIS — I480 Paroxysmal atrial fibrillation: Secondary | ICD-10-CM | POA: Diagnosis not present

## 2016-08-27 DIAGNOSIS — I2510 Atherosclerotic heart disease of native coronary artery without angina pectoris: Secondary | ICD-10-CM | POA: Diagnosis not present

## 2016-08-27 DIAGNOSIS — M6281 Muscle weakness (generalized): Secondary | ICD-10-CM | POA: Diagnosis not present

## 2016-08-27 DIAGNOSIS — K611 Rectal abscess: Secondary | ICD-10-CM | POA: Diagnosis not present

## 2016-08-27 DIAGNOSIS — C20 Malignant neoplasm of rectum: Secondary | ICD-10-CM | POA: Diagnosis not present

## 2016-08-27 DIAGNOSIS — G894 Chronic pain syndrome: Secondary | ICD-10-CM | POA: Diagnosis not present

## 2016-08-28 NOTE — Patient Instructions (Signed)
Juan Wheeler  08/28/2016     '@PREFPERIOPPHARMACY'$ @   Your procedure is scheduled on  09/07/2016   Report to [facility] at  830 A.M.  Call this number if you have problems the morning of surgery:  [phone]   Remember:  Do not eat food or drink liquids after midnight.  Take these medicines the morning of surgery with A SIP OF WATER  Norvasc, imdur, lisinopril, metoprolol, MS Contin or oxycodone, prilosec.   Do not wear jewelry, make-up or nail polish.  Do not wear lotions, powders, or perfumes, or deoderant.  Do not shave 48 hours prior to surgery.  Men may shave face and neck.  Do not bring valuables to the hospital.  [facility] is not responsible for any belongings or valuables.  Contacts, dentures or bridgework may not be worn into surgery.  Leave your suitcase in the car.  After surgery it may be brought to your room.  For patients admitted to the hospital, discharge time will be determined by your treatment team.  Patients discharged the day of surgery will not be allowed to drive home.   Name and phone number of your driver:   family Special instructions:  Follow the diet and prep instructions given to you by Dr Roseanne Kaufman office.  Please read over the following fact sheets that you were given. Anesthesia Post-op Instructions and Care and Recovery After Surgery       Colonoscopy, Adult A colonoscopy is an exam to look at the entire large intestine. During the exam, a lubricated, bendable tube is inserted into the anus and then passed into the rectum, colon, and other parts of the large intestine. A colonoscopy is often done as a part of normal colorectal screening or in response to certain symptoms, such as anemia, persistent diarrhea, abdominal pain, and blood in the stool. The exam can help screen for and diagnose medical problems, including:  Tumors.  Polyps.  Inflammation.  Areas of bleeding. Tell a health care provider about:  Any  allergies you have.  All medicines you are taking, including vitamins, herbs, eye drops, creams, and over-the-counter medicines.  Any problems you or family members have had with anesthetic medicines.  Any blood disorders you have.  Any surgeries you have had.  Any medical conditions you have.  Any problems you have had passing stool. What are the risks? Generally, this is a safe procedure. However, problems may occur, including:  Bleeding.  A tear in the intestine.  A reaction to medicines given during the exam.  Infection (rare). What happens before the procedure? Eating and drinking restrictions  Follow instructions from your health care provider about eating and drinking, which may include:  A few days before the procedure - follow a low-fiber diet. Avoid nuts, seeds, dried fruit, raw fruits, and vegetables.  1-3 days before the procedure - follow a clear liquid diet. Drink only clear liquids, such as clear broth or bouillon, black coffee or tea, clear juice, clear soft drinks or sports drinks, gelatin desert, and popsicles. Avoid any liquids that contain red or purple dye.  On the day of the procedure - do not eat or drink anything during the 2 hours before the procedure, or within the time period that your health care provider recommends. Bowel prep  If you were prescribed an oral bowel prep to clean out your colon:  Take it as told by your health care provider. Starting the day  before your procedure, you will need to drink a large amount of medicated liquid. The liquid will cause you to have multiple loose stools until your stool is almost clear or light green.  If your skin or anus gets irritated from diarrhea, you may use these to relieve the irritation:  Medicated wipes, such as adult wet wipes with aloe and vitamin E.  A skin soothing-product like petroleum jelly.  If you vomit while drinking the bowel prep, take a break for up to 60 minutes and then begin the  bowel prep again. If vomiting continues and you cannot take the bowel prep without vomiting, call your health care provider. General instructions  Ask your health care provider about changing or stopping your regular medicines. This is especially important if you are taking diabetes medicines or blood thinners.  Plan to have someone take you home from the hospital or clinic. What happens during the procedure?  An IV tube may be inserted into one of your veins.  You will be given medicine to help you relax (sedative).  To reduce your risk of infection:  Your health care team will wash or sanitize their hands.  Your anal area will be washed with soap.  You will be asked to lie on your side with your knees bent.  Your health care provider will lubricate a long, thin, flexible tube. The tube will have a camera and a light on the end.  The tube will be inserted into your anus.  The tube will be gently eased through your rectum and colon.  Air will be delivered into your colon to keep it open. You may feel some pressure or cramping.  The camera will be used to take images during the procedure.  A small tissue sample may be removed from your body to be examined under a microscope (biopsy). If any potential problems are found, the tissue will be sent to a lab for testing.  If small polyps are found, your health care provider may remove them and have them checked for cancer cells.  The tube that was inserted into your anus will be slowly removed. The procedure may vary among health care providers and hospitals. What happens after the procedure?  Your blood pressure, heart rate, breathing rate, and blood oxygen level will be monitored until the medicines you were given have worn off.  Do not drive for 24 hours after the exam.  You may have a small amount of blood in your stool.  You may pass gas and have mild abdominal cramping or bloating due to the air that was used to inflate  your colon during the exam.  It is up to you to get the results of your procedure. Ask your health care provider, or the department performing the procedure, when your results will be ready. This information is not intended to replace advice given to you by your health care provider. Make sure you discuss any questions you have with your health care provider. Document Released: 09/11/2000 Document Revised: 04/03/2016 Document Reviewed: 11/26/2015 Elsevier Interactive Patient Education  2017 [employer]  Colonoscopy, Adult, Care After This sheet gives you information about how to care for yourself after your procedure. Your health care provider may also give you more specific instructions. If you have problems or questions, contact your health care provider. What can I expect after the procedure? After the procedure, it is common to have:  A small amount of blood in your stool for 24 hours after the  procedure.  Some gas.  Mild abdominal cramping or bloating. Follow these instructions at home: General instructions  For the first 24 hours after the procedure:  Do not drive or use machinery.  Do not sign important documents.  Do not drink alcohol.  Do your regular daily activities at a slower pace than normal.  Eat soft, easy-to-digest foods.  Rest often.  Take over-the-counter or prescription medicines only as told by your health care provider.  It is up to you to get the results of your procedure. Ask your health care provider, or the department performing the procedure, when your results will be ready. Relieving cramping and bloating  Try walking around when you have cramps or feel bloated.  Apply heat to your abdomen as told by your health care provider. Use a heat source that your health care provider recommends, such as a moist heat pack or a heating pad.  Place a towel between your skin and the heat source.  Leave the heat on for 20-30 minutes.  Remove the heat if  your skin turns bright red. This is especially important if you are unable to feel pain, heat, or cold. You may have a greater risk of getting burned. Eating and drinking  Drink enough fluid to keep your urine clear or pale yellow.  Resume your normal diet as instructed by your health care provider. Avoid heavy or fried foods that are hard to digest.  Avoid drinking alcohol for as long as instructed by your health care provider. Contact a health care provider if:  You have blood in your stool 2-3 days after the procedure. Get help right away if:  You have more than a small spotting of blood in your stool.  You pass large blood clots in your stool.  Your abdomen is swollen.  You have nausea or vomiting.  You have a fever.  You have increasing abdominal pain that is not relieved with medicine. This information is not intended to replace advice given to you by your health care provider. Make sure you discuss any questions you have with your health care provider. Document Released: 04/28/2004 Document Revised: 06/08/2016 Document Reviewed: 11/26/2015 Elsevier Interactive Patient Education  2017 [place] Anesthesia is a term that refers to techniques, procedures, and medicines that help a person stay safe and comfortable during a medical procedure. Monitored anesthesia care, or sedation, is one type of anesthesia. Your anesthesia specialist may recommend sedation if you will be having a procedure that does not require you to be unconscious, such as:  Cataract surgery.  A dental procedure.  A biopsy.  A colonoscopy. During the procedure, you may receive a medicine to help you relax (sedative). There are three levels of sedation:  Mild sedation. At this level, you may feel awake and relaxed. You will be able to follow directions.  Moderate sedation. At this level, you will be sleepy. You may not remember the procedure.  Deep sedation. At this  level, you will be asleep. You will not remember the procedure. The more medicine you are given, the deeper your level of sedation will be. Depending on how you respond to the procedure, the anesthesia specialist may change your level of sedation or the type of anesthesia to fit your needs. An anesthesia specialist will monitor you closely during the procedure. Let your health care provider know about:  Any allergies you have.  All medicines you are taking, including vitamins, herbs, eye drops, creams, and over-the-counter medicines.  Any use of steroids (by mouth or as a cream).  Any problems you or family members have had with sedatives and anesthetic medicines.  Any blood disorders you have.  Any surgeries you have had.  Any medical conditions you have, such as sleep apnea.  Whether you are pregnant or may be pregnant.  Any use of cigarettes, alcohol, or street drugs. What are the risks? Generally, this is a safe procedure. However, problems may occur, including:  Getting too much medicine (oversedation).  Nausea.  Allergic reaction to medicines.  Trouble breathing. If this happens, a breathing tube may be used to help with breathing. It will be removed when you are awake and breathing on your own.  Heart trouble.  Lung trouble. Before the procedure Staying hydrated  Follow instructions from your health care provider about hydration, which may include:  Up to 2 hours before the procedure - you may continue to drink clear liquids, such as water, clear fruit juice, black coffee, and plain tea. Eating and drinking restrictions  Follow instructions from your health care provider about eating and drinking, which may include:  8 hours before the procedure - stop eating heavy meals or foods such as meat, fried foods, or fatty foods.  6 hours before the procedure - stop eating light meals or foods, such as toast or cereal.  6 hours before the procedure - stop drinking milk  or drinks that contain milk.  2 hours before the procedure - stop drinking clear liquids. Medicines  Ask your health care provider about:  Changing or stopping your regular medicines. This is especially important if you are taking diabetes medicines or blood thinners.  Taking medicines such as aspirin and ibuprofen. These medicines can thin your blood. Do not take these medicines before your procedure if your health care provider instructs you not to. Tests and exams  You will have a physical exam.  You may have blood tests done to show:  How well your kidneys and liver are working.  How well your blood can clot.  General instructions  Plan to have someone take you home from the hospital or clinic.  If you will be going home right after the procedure, plan to have someone with you for 24 hours. What happens during the procedure?  Your blood pressure, heart rate, breathing, level of pain and overall condition will be monitored.  An IV tube will be inserted into one of your veins.  Your anesthesia specialist will give you medicines as needed to keep you comfortable during the procedure. This may mean changing the level of sedation.  The procedure will be performed. After the procedure  Your blood pressure, heart rate, breathing rate, and blood oxygen level will be monitored until the medicines you were given have worn off.  Do not drive for 24 hours if you received a sedative.  You may:  Feel sleepy, clumsy, or nauseous.  Feel forgetful about what happened after the procedure.  Have a sore throat if you had a breathing tube during the procedure.  Vomit. This information is not intended to replace advice given to you by your health care provider. Make sure you discuss any questions you have with your health care provider. Document Released: 06/10/2005 Document Revised: 02/21/2016 Document Reviewed: 01/05/2016 Elsevier Interactive Patient Education  2017 [place] POST-ANESTHESIA  IMMEDIATELY FOLLOWING SURGERY:  Do not drive or operate machinery for the first twenty four hours after surgery.  Do not make any important decisions  for twenty four hours after surgery or while taking narcotic pain medications or sedatives.  If you develop intractable nausea and vomiting or a severe headache please notify your doctor immediately.  FOLLOW-UP:  Please make an appointment with your surgeon as instructed. You do not need to follow up with anesthesia unless specifically instructed to do so.  WOUND CARE INSTRUCTIONS (if applicable):  Keep a dry clean dressing on the anesthesia/puncture wound site if there is drainage.  Once the wound has quit draining you may leave it open to air.  Generally you should leave the bandage intact for twenty four hours unless there is drainage.  If the epidural site drains for more than 36-48 hours please call the anesthesia department.  QUESTIONS?:  Please feel free to call your physician or the hospital operator if you have any questions, and they will be happy to assist you.

## 2016-08-31 DIAGNOSIS — M726 Necrotizing fasciitis: Secondary | ICD-10-CM | POA: Diagnosis not present

## 2016-08-31 DIAGNOSIS — G894 Chronic pain syndrome: Secondary | ICD-10-CM | POA: Diagnosis not present

## 2016-08-31 DIAGNOSIS — I480 Paroxysmal atrial fibrillation: Secondary | ICD-10-CM | POA: Diagnosis not present

## 2016-08-31 DIAGNOSIS — C20 Malignant neoplasm of rectum: Secondary | ICD-10-CM | POA: Diagnosis not present

## 2016-08-31 DIAGNOSIS — Z433 Encounter for attention to colostomy: Secondary | ICD-10-CM | POA: Diagnosis not present

## 2016-08-31 DIAGNOSIS — K611 Rectal abscess: Secondary | ICD-10-CM | POA: Diagnosis not present

## 2016-08-31 DIAGNOSIS — M6281 Muscle weakness (generalized): Secondary | ICD-10-CM | POA: Diagnosis not present

## 2016-08-31 DIAGNOSIS — Z86718 Personal history of other venous thrombosis and embolism: Secondary | ICD-10-CM | POA: Diagnosis not present

## 2016-08-31 DIAGNOSIS — Z4800 Encounter for change or removal of nonsurgical wound dressing: Secondary | ICD-10-CM | POA: Diagnosis not present

## 2016-08-31 DIAGNOSIS — I2510 Atherosclerotic heart disease of native coronary artery without angina pectoris: Secondary | ICD-10-CM | POA: Diagnosis not present

## 2016-08-31 DIAGNOSIS — B9689 Other specified bacterial agents as the cause of diseases classified elsewhere: Secondary | ICD-10-CM | POA: Diagnosis not present

## 2016-09-01 ENCOUNTER — Encounter (HOSPITAL_COMMUNITY): Payer: Self-pay

## 2016-09-01 ENCOUNTER — Encounter (HOSPITAL_COMMUNITY)
Admission: RE | Admit: 2016-09-01 | Discharge: 2016-09-01 | Disposition: A | Discharge status: Home / Self Care | Payer: Commercial Managed Care - HMO | Source: Ambulatory Visit | Attending: Internal Medicine | Admitting: Internal Medicine

## 2016-09-01 DIAGNOSIS — Z01812 Encounter for preprocedural laboratory examination: Secondary | ICD-10-CM | POA: Diagnosis not present

## 2016-09-01 HISTORY — DX: Cardiac arrhythmia, unspecified: I49.9

## 2016-09-01 HISTORY — DX: Personal history of other diseases of the musculoskeletal system and connective tissue: Z87.39

## 2016-09-01 LAB — BASIC METABOLIC PANEL
Anion gap: 8 (ref 5–15)
BUN: 11 mg/dL (ref 6–20)
CO2: 20 mmol/L — ABNORMAL LOW (ref 22–32)
Calcium: 8.9 mg/dL (ref 8.9–10.3)
Chloride: 108 mmol/L (ref 101–111)
Creatinine, Ser: 1.17 mg/dL (ref 0.61–1.24)
GFR calc Af Amer: >60 mL/min (ref >60)
GFR calc non Af Amer: >60 mL/min (ref >60)
Glucose, Bld: 97 mg/dL (ref 65–99)
Potassium: 4 mmol/L (ref 3.5–5.1)
Sodium: 136 mmol/L (ref 135–145)

## 2016-09-01 LAB — CBC WITH DIFFERENTIAL/PLATELET
Basophils Absolute: 0 10*3/uL (ref 0.0–0.1)
Basophils Relative: 0 %
Eosinophils Absolute: 0.1 10*3/uL (ref 0.0–0.7)
Eosinophils Relative: 1 %
HCT: 46.2 % (ref 39.0–52.0)
Hemoglobin: 15.7 g/dL (ref 13.0–17.0)
Lymphocytes Relative: 21 %
Lymphs Abs: 2.1 10*3/uL (ref 0.7–4.0)
MCH: 31.9 pg (ref 26.0–34.0)
MCHC: 34 g/dL (ref 30.0–36.0)
MCV: 93.9 fL (ref 78.0–100.0)
Monocytes Absolute: 0.7 10*3/uL (ref 0.1–1.0)
Monocytes Relative: 7 %
Neutro Abs: 7.1 10*3/uL (ref 1.7–7.7)
Neutrophils Relative %: 71 %
Platelets: 236 10*3/uL (ref 150–400)
RBC: 4.92 MIL/uL (ref 4.22–5.81)
RDW: 15.6 % — ABNORMAL HIGH (ref 11.5–15.5)
WBC: 10 10*3/uL (ref 4.0–10.5)

## 2016-09-03 DIAGNOSIS — I739 Peripheral vascular disease, unspecified: Secondary | ICD-10-CM | POA: Diagnosis not present

## 2016-09-03 DIAGNOSIS — I25119 Atherosclerotic heart disease of native coronary artery with unspecified angina pectoris: Secondary | ICD-10-CM | POA: Diagnosis not present

## 2016-09-03 DIAGNOSIS — K611 Rectal abscess: Secondary | ICD-10-CM | POA: Diagnosis not present

## 2016-09-03 DIAGNOSIS — J439 Emphysema, unspecified: Secondary | ICD-10-CM | POA: Diagnosis not present

## 2016-09-03 DIAGNOSIS — I480 Paroxysmal atrial fibrillation: Secondary | ICD-10-CM | POA: Diagnosis not present

## 2016-09-03 DIAGNOSIS — C20 Malignant neoplasm of rectum: Secondary | ICD-10-CM | POA: Diagnosis not present

## 2016-09-03 DIAGNOSIS — I10 Essential (primary) hypertension: Secondary | ICD-10-CM | POA: Diagnosis not present

## 2016-09-03 DIAGNOSIS — G894 Chronic pain syndrome: Secondary | ICD-10-CM | POA: Diagnosis not present

## 2016-09-03 DIAGNOSIS — M6281 Muscle weakness (generalized): Secondary | ICD-10-CM | POA: Diagnosis not present

## 2016-09-07 ENCOUNTER — Ambulatory Visit (HOSPITAL_COMMUNITY)
Admission: RE | Admit: 2016-09-07 | Discharge: 2016-09-07 | Disposition: A | Discharge status: Home / Self Care | Payer: Commercial Managed Care - HMO | Source: Ambulatory Visit | Attending: Internal Medicine | Admitting: Internal Medicine

## 2016-09-07 ENCOUNTER — Ambulatory Visit (HOSPITAL_COMMUNITY): Payer: Commercial Managed Care - HMO | Admitting: Anesthesiology

## 2016-09-07 ENCOUNTER — Telehealth: Payer: Self-pay | Admitting: General Practice

## 2016-09-07 ENCOUNTER — Encounter (HOSPITAL_COMMUNITY)
Admission: RE | Disposition: A | Discharge status: Home / Self Care | Payer: Self-pay | Source: Ambulatory Visit | Attending: Internal Medicine

## 2016-09-07 DIAGNOSIS — Z08 Encounter for follow-up examination after completed treatment for malignant neoplasm: Secondary | ICD-10-CM | POA: Insufficient documentation

## 2016-09-07 DIAGNOSIS — I2510 Atherosclerotic heart disease of native coronary artery without angina pectoris: Secondary | ICD-10-CM | POA: Diagnosis not present

## 2016-09-07 DIAGNOSIS — Z933 Colostomy status: Secondary | ICD-10-CM | POA: Diagnosis not present

## 2016-09-07 DIAGNOSIS — C20 Malignant neoplasm of rectum: Secondary | ICD-10-CM | POA: Diagnosis not present

## 2016-09-07 DIAGNOSIS — Z1211 Encounter for screening for malignant neoplasm of colon: Secondary | ICD-10-CM

## 2016-09-07 DIAGNOSIS — Z85048 Personal history of other malignant neoplasm of rectum, rectosigmoid junction, and anus: Secondary | ICD-10-CM | POA: Diagnosis not present

## 2016-09-07 DIAGNOSIS — I10 Essential (primary) hypertension: Secondary | ICD-10-CM | POA: Insufficient documentation

## 2016-09-07 DIAGNOSIS — K603 Anal fistula: Secondary | ICD-10-CM

## 2016-09-07 DIAGNOSIS — Z7901 Long term (current) use of anticoagulants: Secondary | ICD-10-CM | POA: Diagnosis not present

## 2016-09-07 DIAGNOSIS — K219 Gastro-esophageal reflux disease without esophagitis: Secondary | ICD-10-CM | POA: Insufficient documentation

## 2016-09-07 DIAGNOSIS — Z538 Procedure and treatment not carried out for other reasons: Secondary | ICD-10-CM | POA: Insufficient documentation

## 2016-09-07 DIAGNOSIS — Q438 Other specified congenital malformations of intestine: Secondary | ICD-10-CM | POA: Insufficient documentation

## 2016-09-07 DIAGNOSIS — Z86718 Personal history of other venous thrombosis and embolism: Secondary | ICD-10-CM | POA: Insufficient documentation

## 2016-09-07 DIAGNOSIS — I739 Peripheral vascular disease, unspecified: Secondary | ICD-10-CM | POA: Insufficient documentation

## 2016-09-07 DIAGNOSIS — Z79891 Long term (current) use of opiate analgesic: Secondary | ICD-10-CM | POA: Diagnosis not present

## 2016-09-07 DIAGNOSIS — Z1212 Encounter for screening for malignant neoplasm of rectum: Secondary | ICD-10-CM

## 2016-09-07 DIAGNOSIS — J449 Chronic obstructive pulmonary disease, unspecified: Secondary | ICD-10-CM | POA: Diagnosis not present

## 2016-09-07 DIAGNOSIS — Z9221 Personal history of antineoplastic chemotherapy: Secondary | ICD-10-CM | POA: Insufficient documentation

## 2016-09-07 DIAGNOSIS — I4891 Unspecified atrial fibrillation: Secondary | ICD-10-CM | POA: Insufficient documentation

## 2016-09-07 DIAGNOSIS — F17210 Nicotine dependence, cigarettes, uncomplicated: Secondary | ICD-10-CM | POA: Diagnosis not present

## 2016-09-07 DIAGNOSIS — Z9049 Acquired absence of other specified parts of digestive tract: Secondary | ICD-10-CM | POA: Diagnosis not present

## 2016-09-07 DIAGNOSIS — Z79899 Other long term (current) drug therapy: Secondary | ICD-10-CM | POA: Diagnosis not present

## 2016-09-07 HISTORY — PX: COLONOSCOPY WITH PROPOFOL: SHX5780

## 2016-09-07 SURGERY — COLONOSCOPY WITH PROPOFOL
Anesthesia: Monitor Anesthesia Care

## 2016-09-07 MED ORDER — MIDAZOLAM HCL 2 MG/2ML IJ SOLN
INTRAMUSCULAR | Status: AC
  Filled 2016-09-07: qty 2

## 2016-09-07 MED ORDER — PROPOFOL 500 MG/50ML IV EMUL
INTRAVENOUS | Status: DC | PRN
  Administered 2016-09-07: 150 ug/kg/min via INTRAVENOUS
  Administered 2016-09-07: 10:00:00 via INTRAVENOUS

## 2016-09-07 MED ORDER — MIDAZOLAM HCL 2 MG/2ML IJ SOLN
1.0000 mg | INTRAMUSCULAR | Status: DC | PRN
  Administered 2016-09-07: 2 mg via INTRAVENOUS

## 2016-09-07 MED ORDER — EPHEDRINE SULFATE 50 MG/ML IJ SOLN
INTRAMUSCULAR | Status: DC | PRN
  Administered 2016-09-07: 10 mg via INTRAVENOUS

## 2016-09-07 MED ORDER — GLYCOPYRROLATE 0.2 MG/ML IJ SOLN
INTRAMUSCULAR | Status: AC
  Filled 2016-09-07: qty 1

## 2016-09-07 MED ORDER — PROPOFOL 10 MG/ML IV BOLUS
INTRAVENOUS | Status: AC
  Filled 2016-09-07: qty 20

## 2016-09-07 MED ORDER — CHLORHEXIDINE GLUCONATE CLOTH 2 % EX PADS: 6.0000 | MEDICATED_PAD | Freq: Once | CUTANEOUS | Status: DC

## 2016-09-07 MED ORDER — FENTANYL CITRATE (PF) 100 MCG/2ML IJ SOLN
25.0000 ug | INTRAMUSCULAR | Status: AC | PRN
  Administered 2016-09-07 (×2): 25 ug via INTRAVENOUS

## 2016-09-07 MED ORDER — FENTANYL CITRATE (PF) 100 MCG/2ML IJ SOLN
INTRAMUSCULAR | Status: AC
  Filled 2016-09-07: qty 2

## 2016-09-07 MED ORDER — LACTATED RINGERS IV SOLN
INTRAVENOUS | Status: DC
  Administered 2016-09-07: 1000 mL via INTRAVENOUS
  Administered 2016-09-07: 11:00:00 via INTRAVENOUS

## 2016-09-07 NOTE — OR Nursing (Signed)
Patient's wedding ring given to his wife.

## 2016-09-07 NOTE — Telephone Encounter (Signed)
Please see note below from RMR about referral to [facility]

## 2016-09-07 NOTE — Transfer of Care (Signed)
Immediate Anesthesia Transfer of Care Note  Patient: Juan Wheeler  Procedure(s) Performed: Procedure(s) with comments: COLONOSCOPY WITH PROPOFOL (N/A) - 10:00 am Colonoscopy via rectum and ostomy  Patient Location: PACU  Anesthesia Type:MAC  Level of Consciousness: awake, oriented and patient cooperative  Airway & Oxygen Therapy: Patient Spontanous Breathing and Patient connected to nasal cannula oxygen  Post-op Assessment: Report given to RN and Post -op Vital signs reviewed and stable  Post vital signs: Reviewed and stable  Last Vitals:  Vitals:   09/07/16 0920 09/07/16 0925  BP: 106/76 111/76  Pulse:    Resp: 16 17  Temp:      Last Pain:  Vitals:   09/07/16 0849  TempSrc: Oral         Complications: No apparent anesthesia complications

## 2016-09-07 NOTE — Telephone Encounter (Signed)
-----   Message from Daneil Dolin, MD sent at 09/07/2016 11:22 AM EST ----- I could not do colonoscopy through ostomy. See procedure note. Patient needs to go back to [facility] to have this done.  Patient is having gluteal area examined by Dr. Arnoldo Morale in PACU before he goes home as well.  Discussed above with patient's wife.

## 2016-09-07 NOTE — H&P (View-Only) (Signed)
Primary Care Physician:  [name], MD  Primary Gastroenterologist:    Chief Complaint  Patient presents with  . Colonoscopy    HPI:  Juan Wheeler is a 55 y.o. male here for consideration of colonoscopy. He has a history of stage IIA rectal adenocarcinoma status post chemotherapy, diagnosed and treated at [facility]. Underwent LAR with temporary loop ileostomy by [name] on 05/11/2011 with subsequent closure of ileostomy in March 2013.   Patient had done reasonably well up until around September. He started having rectal pain  out of the ordinary. Unfortunately he developed septic shock due to necrotizing fasciitis from extensive left buttock abscess. Initially admitted to [facility] for emergent incision and drainage by Dr. Aviva Signs, 06/05/2016. Transferred to [facility] for further management. Patient underwent a second I&D by Dr. Greer Pickerel on 06/07/2016. Unfortunately after the second debridement patient began passing large amounts of stool through the bite ocular wound consistent with fistula formation. Patient underwent diverting laparoscopic colostomy on 06/11/2016 by Dr. [name]. Patient finally discharged after 11 days of hospitalization. The following month he was readmitted at [facility] with sepsis unclear etiology.  Notable patient started on Eliquis 05/2016 for new onset Afib. He does have h/o DVT in 2013.   Patient trying to heal from recent events. Still having to pack left buttocks wound. Doing well with regards to colostomy. Output is good. No blood or black stools. He denies rectal drainage. Appetite is good. No vomiting. Weight is up 10 pounds in the past month, back to baseline. Patient denies abdominal pain.  Patient's last colonoscopy was in September 2013 by Dr. Lenise Arena. Colorectal anastomosis noted in the rectum with some inflammation and small ulcer noted. Biopsy negative. Recommended repeat colonoscopy in 3 years.  Patient states that his  rectal cancer was diagnosed and treated at [facility], he was living in [place] at the time. Now he wants to keep his care here locally.   Current Outpatient Prescriptions  Medication Sig Dispense Refill  . apixaban (ELIQUIS) 5 MG TABS tablet Take 1 tablet (5 mg total) by mouth 2 (two) times daily.    . isosorbide mononitrate (IMDUR) 30 MG 24 hr tablet TAKE 1/2 TABLET BY MOUTH ONCE DAILY. (Patient taking differently: Take 15 mg by mouth daily. ) 15 tablet 3  . lovastatin (MEVACOR) 10 MG tablet Take 1 tablet (10 mg total) by mouth at bedtime. 30 tablet 11  . metoprolol tartrate (LOPRESSOR) 25 MG tablet Take 1 tablet (25 mg total) by mouth 2 (two) times daily.    Marland Kitchen morphine (MS CONTIN) 60 MG 12 hr tablet Take 1 tablet (60 mg total) by mouth every 8 (eight) hours. 90 tablet 0  . nitroGLYCERIN (NITROSTAT) 0.4 MG SL tablet PLACE 1 TABLET UNDER TONGUE IF NEEDED FOR CHEST PAIN..UP TO 3 DOSES 25 tablet 3  . omeprazole (PRILOSEC) 20 MG capsule Take 1 capsule by mouth daily.    . Oxycodone HCl 10 MG TABS Take 1-2 tablets (10-20 mg total) by mouth every 4 (four) hours as needed. 100 tablet 0  . sulfamethoxazole-trimethoprim (BACTRIM DS,SEPTRA DS) 800-160 MG tablet Take 1 tablet by mouth 2 (two) times daily. Starting 07/07/2016 x 5 days.    . vitamin C (VITAMIN C) 500 MG tablet Take 1 tablet (500 mg total) by mouth daily.    Marland Kitchen zinc sulfate 220 (50 Zn) MG capsule Take 1 capsule (220 mg total) by mouth daily.     No current facility-administered medications for this visit.  Allergies as of 08/10/2016 - Review Complete 08/10/2016  Allergen Reaction Noted  . Darvocet [propoxyphene n-acetaminophen] Palpitations 02/26/2012  . Propoxyphene Palpitations 10/24/2015    Past Medical History:  Diagnosis Date  . Chronic lower back pain    a. Followed by pain management at [facility].  . Colon cancer [facility])    rectal cancer  . Coronary artery disease    a. 03/2013: abnl nuc -> LHC s/p DES to LCx, residual  moderate disease in LAD (med rx unless refractory angina). b. Not on BB due to bradycardia.  Marland Kitchen DVT (deep venous thrombosis) ([place]) ~ 2013  . GERD (gastroesophageal reflux disease)   . History of blood transfusion    "once; after throwing up alot of blood" (04/17/2013)  . Hypertension   . LV dysfunction    a. EF 45% in 03/2013.  Marland Kitchen PAD (peripheral artery disease) ([place])    a. Occlusion of the right internal iliac artery, with significant atherosclerosis in the left internal iliac which was not amenable to reconstruction per notes from [facility].  . Pulmonary nodules 09/30/2014  . Rectal cancer ([place])   . Tobacco abuse     Past Surgical History:  Procedure Laterality Date  . ABDOMINAL SURGERY  1990's   'for stomach ulcers" (04/17/2013)  . COLECTOMY  2012   "for rectal cancer" (04/17/2013)  . COLONOSCOPY  2013   Dr. Cheryll Cockayne: colorectal anastomosis with ulcer and inflammation, benign biopsy  . COLOSTOMY TAKEDOWN  2013  . CORONARY ANGIOPLASTY WITH STENT PLACEMENT  04/17/2013   "?1" (04/17/2013)  . FEMORAL-POPLITEAL BYPASS GRAFT Left 07/02/2015   Procedure: BYPASS GRAFT LEFT COMMON FEMORAL ARTERY TO LEFT ABOVE KNEE POPLITEAL ARTERY - USING LEFT GREATER SAPPHENOUS VEIN;  Surgeon: Elam Dutch, MD;  Location: [place];  Service: Vascular;  Laterality: Left;  . INCISION AND DRAINAGE ABSCESS Left 06/05/2016   Procedure: INCISION AND DRAINAGE ABSCESS;  Surgeon: Aviva Signs, MD;  Location: [facility];  Service: General;  Laterality: Left;  . INCISION AND DRAINAGE PERIRECTAL ABSCESS Left 06/07/2016   Procedure: IRRIGATION AND DEBRIDEMENT LEFT BUTTOCK ABSCESS;  Surgeon: Greer Pickerel, MD;  Location: [place];  Service: General;  Laterality: Left;  . INGUINAL HERNIA REPAIR Bilateral 1990's  . LAPAROSCOPIC PARTIAL COLECTOMY N/A 06/11/2016   Procedure: LAPAROSCOPIC  OPEN COLOSTOMY;  Surgeon: [name], MD;  Location: [place];  Service: General;  Laterality: N/A;  . LEFT HEART CATHETERIZATION WITH CORONARY  ANGIOGRAM N/A 04/17/2013   Procedure: LEFT HEART CATHETERIZATION WITH CORONARY ANGIOGRAM;  Surgeon: Peter M Martinique, MD;  Location: [facility] CATH LAB;  Service: Cardiovascular;  Laterality: N/A;  . PERCUTANEOUS STENT INTERVENTION  04/17/2013   Procedure: PERCUTANEOUS STENT INTERVENTION;  Surgeon: Peter M Martinique, MD;  Location: [facility] CATH LAB;  Service: Cardiovascular;;  . PERIPHERAL VASCULAR CATHETERIZATION N/A 06/14/2015   Procedure: Abdominal Aortogram;  Surgeon: Elam Dutch, MD;  Location: [place] CV LAB;  Service: Cardiovascular;  Laterality: N/A;  . PERIPHERAL VASCULAR CATHETERIZATION Bilateral 06/14/2015   Procedure: Lower Extremity Angiography;  Surgeon: Elam Dutch, MD;  Location: [place] CV LAB;  Service: Cardiovascular;  Laterality: Bilateral;  . VEIN HARVEST Left 07/02/2015   Procedure: VEIN HARVEST - LEFT GREATER SAPPHENOUS VEIN;  Surgeon: Elam Dutch, MD;  Location: [facility] OR;  Service: Vascular;  Laterality: Left;    Family History  Problem Relation Age of Onset  . Cancer Mother   . Hypertension Mother   . Bleeding Disorder Brother     Social History  Social History  . Marital status: Married    Spouse name: N/A  . Number of children: N/A  . Years of education: N/A   Occupational History  . Not on file.   Social History Main Topics  . Smoking status: Light Tobacco Smoker    Packs/day: 0.25    Years: 40.00    Types: Cigarettes    Start date: 03/14/1974  . Smokeless tobacco: Never Used     Comment: 5-6 per day 06/12/15  . Alcohol use 4.2 oz/week    6 Cans of beer, 1 Shots of liquor per week     Comment: 04/17/2013 "bout a 6 pack/wk"  . Drug use:     Types: Marijuana     Comment: about a week ago  . Sexual activity: Yes    Partners: Male    Birth control/ protection: None   Other Topics Concern  . Not on file   Social History Narrative  . No narrative on file      ROS:  General: Negative for anorexia, weight loss, fever, chills, fatigue,  weakness. Eyes: Negative for vision changes.  ENT: Negative for hoarseness, difficulty swallowing , nasal congestion. CV: Negative for chest pain, angina, palpitations, dyspnea on exertion, peripheral edema.  Respiratory: Negative for dyspnea at rest, dyspnea on exertion, cough, sputum, wheezing.  GI: See history of present illness. GU:  Negative for dysuria, hematuria, urinary incontinence, urinary frequency, nocturnal urination.  MS: Negative for joint pain, low back pain.  Derm: Negative for rash or itching.  Neuro: Negative for weakness, abnormal sensation, seizure, frequent headaches, memory loss, confusion.  Psych: Negative for anxiety, depression, suicidal ideation, hallucinations.  Endo: Negative for unusual weight change.  Heme: Negative for bruising or bleeding. Allergy: Negative for rash or hives.    Physical Examination:  BP (!) 141/86   Pulse 86   Temp 98.4 F (36.9 C) (Oral)   Ht '5\' 9"'$  (1.753 m)   Wt 179 lb 12.8 oz (81.6 kg)   BMI 26.55 kg/m    General: Well-nourished, well-developed in no acute distress.  Head: Normocephalic, atraumatic.   Eyes: Conjunctiva pink, no icterus. Mouth: Oropharyngeal mucosa moist and pink , no lesions erythema or exudate. Neck: Supple without thyromegaly, masses, or lymphadenopathy.  Lungs: Clear to auscultation bilaterally.  Heart: Regular rate and rhythm, no murmurs rubs or gallops.  Abdomen: Bowel sounds are normal, nontender, nondistended, no hepatosplenomegaly or masses, no abdominal bruits or    hernia , no rebound or guarding.   Rectal: left buttock with dressing in place. Anal opening appeared normal. DRE with some tenderness right at the opening, small amount of brbpr on gloved finger. Did not appreciate mass. Exam limited by discomfort.  Extremities: No lower extremity edema. No clubbing or deformities.  Neuro: Alert and oriented x 4 , grossly normal neurologically.  Skin: Warm and dry, no rash or jaundice.   Psych: Alert  and cooperative, normal mood and affect.  Labs: Lab Results  Component Value Date   WBC 7.3 07/10/2016   HGB 12.1 (L) 07/10/2016   HCT 36.0 (L) 07/10/2016   MCV 93.5 07/10/2016   PLT 205 07/10/2016   Lab Results  Component Value Date   CREATININE 1.00 07/10/2016   BUN 7 07/10/2016   NA 138 07/10/2016   K 3.9 07/10/2016   CL 107 07/10/2016   CO2 25 07/10/2016   Lab Results  Component Value Date   ALT 29 07/08/2016   AST 25 07/08/2016   ALKPHOS 96  07/08/2016   BILITOT 0.6 07/08/2016   Lab Results  Component Value Date   CEA 4.3 06/02/2016     Imaging Studies: No results found.

## 2016-09-07 NOTE — Discharge Instructions (Signed)
°  Colonoscopy Discharge Instructions  Read the instructions outlined below and refer to this sheet in the next few weeks. These discharge instructions provide you with general information on caring for yourself after you leave the hospital. Your doctor may also give you specific instructions. While your treatment has been planned according to the most current medical practices available, unavoidable complications occasionally occur. If you have any problems or questions after discharge, call Dr. Gala Romney at [phone]. ACTIVITY  You may resume your regular activity, but move at a slower pace for the next 24 hours.   Take frequent rest periods for the next 24 hours.   Walking will help get rid of the air and reduce the bloated feeling in your belly (abdomen).   No driving for 24 hours (because of the medicine (anesthesia) used during the test).    Do not sign any important legal documents or operate any machinery for 24 hours (because of the anesthesia used during the test).  NUTRITION  Drink plenty of fluids.   You may resume your normal diet as instructed by your doctor.   Begin with a light meal and progress to your normal diet. Heavy or fried foods are harder to digest and may make you feel sick to your stomach (nauseated).   Avoid alcoholic beverages for 24 hours or as instructed.  MEDICATIONS  You may resume your normal medications unless your doctor tells you otherwise.  WHAT YOU CAN EXPECT TODAY  Some feelings of bloating in the abdomen.   Passage of more gas than usual.   Spotting of blood in your stool or on the toilet paper.  IF YOU HAD POLYPS REMOVED DURING THE COLONOSCOPY:  No aspirin products for 7 days or as instructed.   No alcohol for 7 days or as instructed.   Eat a soft diet for the next 24 hours.  FINDING OUT THE RESULTS OF YOUR TEST Not all test results are available during your visit. If your test results are not back during the visit, make an appointment  with your caregiver to find out the results. Do not assume everything is normal if you have not heard from your caregiver or the medical facility. It is important for you to follow up on all of your test results.  SEEK IMMEDIATE MEDICAL ATTENTION IF:  You have more than a spotting of blood in your stool.   Your belly is swollen (abdominal distention).   You are nauseated or vomiting.   You have a temperature over 101.   You have abdominal pain or discomfort that is severe or gets worse throughout the day.    Your colonoscopy could not be completed today because we could not see into your colon.  Dr. Arnoldo Morale will evaluate your gluteal wound today.  Resume Coumadin today.  We will arrange free to have a colonoscopy done at [facility].

## 2016-09-07 NOTE — Anesthesia Preprocedure Evaluation (Signed)
Anesthesia Evaluation  Patient identified by MRN, date of birth, ID band Patient awake    Reviewed: Allergy & Precautions, NPO status , Patient's Chart, lab work & pertinent test results  History of Anesthesia Complications Negative for: history of anesthetic complications  Airway Mallampati: III  TM Distance: >3 FB Neck ROM: Full    Dental  (+) Edentulous Upper, Partial Lower, Dental Advisory Given   Pulmonary COPD, Current Smoker,    breath sounds clear to auscultation       Cardiovascular hypertension, Pt. on medications (-) angina+ CAD, + Cardiac Stents, + Peripheral Vascular Disease and + DVT   Rhythm:Regular Rate:Normal  9/16 stress: There was no ST segment deviation noted during stress, EF: 54%. The study is normal. There are no perfusion defects consistent with infarct or ischemia.   Neuro/Psych PSYCHIATRIC DISORDERS negative neurological ROS     GI/Hepatic Neg liver ROS, GERD  Medicated and Controlled,  Endo/Other  negative endocrine ROS  Renal/GU negative Renal ROS     Musculoskeletal   Abdominal   Peds  Hematology negative hematology ROS (+)   Anesthesia Other Findings   Reproductive/Obstetrics                             Anesthesia Physical Anesthesia Plan  ASA: III  Anesthesia Plan: MAC   Post-op Pain Management:    Induction: Intravenous  Airway Management Planned: Simple Face Mask  Additional Equipment:   Intra-op Plan:   Post-operative Plan:   Informed Consent: I have reviewed the patients History and Physical, chart, labs and discussed the procedure including the risks, benefits and alternatives for the proposed anesthesia with the patient or authorized representative who has indicated his/her understanding and acceptance.     Plan Discussed with:   Anesthesia Plan Comments:         Anesthesia Quick Evaluation

## 2016-09-07 NOTE — Interval H&P Note (Signed)
History and Physical Interval Note:  09/07/2016 9:33 AM  Juan Wheeler  has presented today for surgery, with the diagnosis of h/o rectal adenocarcinoma in 2012, recent left buttock abcess with fistula requiring diverting colostomy  The various methods of treatment have been discussed with the patient and family. After consideration of risks, benefits and other options for treatment, the patient has consented to  Procedure(s) with comments: COLONOSCOPY WITH PROPOFOL (N/A) - 10:00 am Colonoscopy via rectum and ostomy as a surgical intervention .  The patient's history has been reviewed, patient examined, no change in status, stable for surgery.  I have reviewed the patient's chart and labs.  Questions were answered to the patient's satisfaction.     Juan Wheeler  No change. Colonoscopy through ostomy and proctoscopy per plan. Last Eliquis 3 days ago.  The risks, benefits, limitations, alternatives and imponderables have been reviewed with the patient. Questions have been answered. All parties are agreeable.

## 2016-09-07 NOTE — Anesthesia Postprocedure Evaluation (Signed)
Anesthesia Post Note  Patient: Dorthy Hustead  Procedure(s) Performed: Procedure(s) (LRB): COLONOSCOPY WITH PROPOFOL (N/A)  Patient location during evaluation: PACU Anesthesia Type: MAC Level of consciousness: awake and oriented Pain management: pain level controlled Vital Signs Assessment: post-procedure vital signs reviewed and stable Respiratory status: spontaneous breathing Cardiovascular status: stable Postop Assessment: no signs of nausea or vomiting Anesthetic complications: no    Last Vitals:  Vitals:   09/07/16 0920 09/07/16 0925  BP: 106/76 111/76  Pulse:    Resp: 16 17  Temp:      Last Pain:  Vitals:   09/07/16 0849  TempSrc: Oral                 ADAMS, AMY A

## 2016-09-08 ENCOUNTER — Other Ambulatory Visit: Payer: Self-pay

## 2016-09-08 DIAGNOSIS — C20 Malignant neoplasm of rectum: Secondary | ICD-10-CM

## 2016-09-08 NOTE — Op Note (Signed)
NAME:  JOB, HOLTSCLAW                    ACCOUNT NO.:  MEDICAL RECORD NO.:  [id number]  LOCATION:                                 FACILITY:  PHYSICIAN:  R. Garfield Cornea, MD Gordon:  1961/09/01  DATE OF PROCEDURE:  09/07/2016 DATE OF DISCHARGE:                              OPERATIVE REPORT   PROCEDURE:  Attempted colonoscopy through colostomy and proctoscopy.  INDICATIONS FOR PROCEDURE:  A 55 year old gentleman status post low anterior resection for colon cancer.  Postoperative course complicated by gluteal abscess and sepsis.  He comes now for surveillance colonoscopy of the rectal stump and residual colon.  Risks, benefits, limitations, alternatives, and imponderables have been discussed, questions answered.  Please see documentation medical record.  PROCEDURE NOTE:  O2 saturation, blood pressure, pulse, and respirations monitored throughout the entire procedure.  Deep sedation per Dr. Duwayne Heck and associates.  The patient's last dose of Eliquis was 3 days ago.  INSTRUMENT:  Pentax video chip system.  FINDINGS:  Ostomy looked healthy.  The scope was introduced into the distal colon viathe ostomy.  The patient's colon was marginally prepped;  There was quit of a bit of dilated redundant colon present.  I took over an hour trying to insufflate the colon enough to advance the colon upstream. The lumen was elusive and I could not get beyond 10 cm exhausting a number of maneuvers to try to seal the leakage of gas around the ostomy changing the patient's position.  Perhaps only the distal 10-15 cm of colon was visualized because of this technical difficulty.  I abandoned further attempts to reach the cecum.  I did do a digital rectal exam and inspected the rectal stump, which was about 5 cm in length.  It appeared normal.  However, there is concern for a recurrent gluteal abscess.  His left buttock is dressed all the way down to the anal opening.  He tolerated the  procedure well and was taken to PACU in stable condition.  IMPRESSION: 1. Attempted colonoscopy through ostomy, unable to do as described. 2. Rectal stump appeared to be normal.  There is a concern of     recurrent gluteal abscess.  I did not inspect the wound under the     dressing.  PLAN:  Plans have already been made for Dr. Jamesetta So to see this patient in the PACU today.  Some point in the near future, the patient should be seen in the [place] to accomplish surveillance colonoscopy through ostomy.     Bridgette Habermann, MD FACP [facility]     RMR/MEDQ  D:  09/07/2016  T:  09/07/2016  Job:  [id number]  cc:   Brandon Melnick D. [name], MD Fax: [phone]

## 2016-09-08 NOTE — Telephone Encounter (Signed)
Referral info faxed to [facility].

## 2016-09-10 ENCOUNTER — Encounter (HOSPITAL_COMMUNITY): Payer: Self-pay | Admitting: Internal Medicine

## 2016-09-10 DIAGNOSIS — J439 Emphysema, unspecified: Secondary | ICD-10-CM | POA: Diagnosis not present

## 2016-09-10 DIAGNOSIS — C20 Malignant neoplasm of rectum: Secondary | ICD-10-CM | POA: Diagnosis not present

## 2016-09-10 DIAGNOSIS — I25119 Atherosclerotic heart disease of native coronary artery with unspecified angina pectoris: Secondary | ICD-10-CM | POA: Diagnosis not present

## 2016-09-10 DIAGNOSIS — M6281 Muscle weakness (generalized): Secondary | ICD-10-CM | POA: Diagnosis not present

## 2016-09-10 DIAGNOSIS — I739 Peripheral vascular disease, unspecified: Secondary | ICD-10-CM | POA: Diagnosis not present

## 2016-09-10 DIAGNOSIS — I10 Essential (primary) hypertension: Secondary | ICD-10-CM | POA: Diagnosis not present

## 2016-09-10 DIAGNOSIS — I480 Paroxysmal atrial fibrillation: Secondary | ICD-10-CM | POA: Diagnosis not present

## 2016-09-10 DIAGNOSIS — G894 Chronic pain syndrome: Secondary | ICD-10-CM | POA: Diagnosis not present

## 2016-09-10 DIAGNOSIS — K611 Rectal abscess: Secondary | ICD-10-CM | POA: Diagnosis not present

## 2016-09-14 ENCOUNTER — Ambulatory Visit: Payer: Commercial Managed Care - HMO | Admitting: Gastroenterology

## 2016-09-17 DIAGNOSIS — I10 Essential (primary) hypertension: Secondary | ICD-10-CM | POA: Diagnosis not present

## 2016-09-17 DIAGNOSIS — K611 Rectal abscess: Secondary | ICD-10-CM | POA: Diagnosis not present

## 2016-09-17 DIAGNOSIS — G894 Chronic pain syndrome: Secondary | ICD-10-CM | POA: Diagnosis not present

## 2016-09-17 DIAGNOSIS — M6281 Muscle weakness (generalized): Secondary | ICD-10-CM | POA: Diagnosis not present

## 2016-09-17 DIAGNOSIS — I480 Paroxysmal atrial fibrillation: Secondary | ICD-10-CM | POA: Diagnosis not present

## 2016-09-17 DIAGNOSIS — C20 Malignant neoplasm of rectum: Secondary | ICD-10-CM | POA: Diagnosis not present

## 2016-09-17 DIAGNOSIS — I739 Peripheral vascular disease, unspecified: Secondary | ICD-10-CM | POA: Diagnosis not present

## 2016-09-17 DIAGNOSIS — I25119 Atherosclerotic heart disease of native coronary artery with unspecified angina pectoris: Secondary | ICD-10-CM | POA: Diagnosis not present

## 2016-09-17 DIAGNOSIS — J439 Emphysema, unspecified: Secondary | ICD-10-CM | POA: Diagnosis not present

## 2016-09-24 DIAGNOSIS — I25119 Atherosclerotic heart disease of native coronary artery with unspecified angina pectoris: Secondary | ICD-10-CM | POA: Diagnosis not present

## 2016-09-24 DIAGNOSIS — Z0000 Encounter for general adult medical examination without abnormal findings: Secondary | ICD-10-CM | POA: Diagnosis not present

## 2016-09-24 DIAGNOSIS — M5489 Other dorsalgia: Secondary | ICD-10-CM | POA: Diagnosis not present

## 2016-09-24 DIAGNOSIS — G894 Chronic pain syndrome: Secondary | ICD-10-CM | POA: Diagnosis not present

## 2016-09-24 DIAGNOSIS — Z933 Colostomy status: Secondary | ICD-10-CM | POA: Diagnosis not present

## 2016-09-24 DIAGNOSIS — I739 Peripheral vascular disease, unspecified: Secondary | ICD-10-CM | POA: Diagnosis not present

## 2016-09-24 DIAGNOSIS — I10 Essential (primary) hypertension: Secondary | ICD-10-CM | POA: Diagnosis not present

## 2016-09-24 DIAGNOSIS — I2510 Atherosclerotic heart disease of native coronary artery without angina pectoris: Secondary | ICD-10-CM | POA: Diagnosis not present

## 2016-09-24 DIAGNOSIS — J439 Emphysema, unspecified: Secondary | ICD-10-CM | POA: Diagnosis not present

## 2016-09-24 DIAGNOSIS — K611 Rectal abscess: Secondary | ICD-10-CM | POA: Diagnosis not present

## 2016-09-24 DIAGNOSIS — F17200 Nicotine dependence, unspecified, uncomplicated: Secondary | ICD-10-CM | POA: Diagnosis not present

## 2016-09-24 DIAGNOSIS — I480 Paroxysmal atrial fibrillation: Secondary | ICD-10-CM | POA: Diagnosis not present

## 2016-09-24 DIAGNOSIS — C20 Malignant neoplasm of rectum: Secondary | ICD-10-CM | POA: Diagnosis not present

## 2016-09-24 DIAGNOSIS — M6281 Muscle weakness (generalized): Secondary | ICD-10-CM | POA: Diagnosis not present

## 2016-09-29 DIAGNOSIS — K611 Rectal abscess: Secondary | ICD-10-CM | POA: Diagnosis not present

## 2016-09-29 DIAGNOSIS — I480 Paroxysmal atrial fibrillation: Secondary | ICD-10-CM | POA: Diagnosis not present

## 2016-09-29 DIAGNOSIS — C20 Malignant neoplasm of rectum: Secondary | ICD-10-CM | POA: Diagnosis not present

## 2016-09-29 DIAGNOSIS — J439 Emphysema, unspecified: Secondary | ICD-10-CM | POA: Diagnosis not present

## 2016-09-29 DIAGNOSIS — M6281 Muscle weakness (generalized): Secondary | ICD-10-CM | POA: Diagnosis not present

## 2016-09-29 DIAGNOSIS — I25119 Atherosclerotic heart disease of native coronary artery with unspecified angina pectoris: Secondary | ICD-10-CM | POA: Diagnosis not present

## 2016-09-29 DIAGNOSIS — I739 Peripheral vascular disease, unspecified: Secondary | ICD-10-CM | POA: Diagnosis not present

## 2016-09-29 DIAGNOSIS — G894 Chronic pain syndrome: Secondary | ICD-10-CM | POA: Diagnosis not present

## 2016-09-29 DIAGNOSIS — I10 Essential (primary) hypertension: Secondary | ICD-10-CM | POA: Diagnosis not present

## 2016-10-01 DIAGNOSIS — K611 Rectal abscess: Secondary | ICD-10-CM | POA: Diagnosis not present

## 2016-10-05 DIAGNOSIS — Z85048 Personal history of other malignant neoplasm of rectum, rectosigmoid junction, and anus: Secondary | ICD-10-CM | POA: Diagnosis not present

## 2016-10-05 DIAGNOSIS — K603 Anal fistula: Secondary | ICD-10-CM | POA: Diagnosis not present

## 2016-10-05 DIAGNOSIS — Z09 Encounter for follow-up examination after completed treatment for conditions other than malignant neoplasm: Secondary | ICD-10-CM | POA: Diagnosis not present

## 2016-10-06 ENCOUNTER — Other Ambulatory Visit: Payer: Self-pay | Admitting: Cardiovascular Disease

## 2016-10-06 DIAGNOSIS — M6281 Muscle weakness (generalized): Secondary | ICD-10-CM | POA: Diagnosis not present

## 2016-10-06 DIAGNOSIS — I480 Paroxysmal atrial fibrillation: Secondary | ICD-10-CM | POA: Diagnosis not present

## 2016-10-06 DIAGNOSIS — G894 Chronic pain syndrome: Secondary | ICD-10-CM | POA: Diagnosis not present

## 2016-10-06 DIAGNOSIS — K611 Rectal abscess: Secondary | ICD-10-CM | POA: Diagnosis not present

## 2016-10-06 DIAGNOSIS — I739 Peripheral vascular disease, unspecified: Secondary | ICD-10-CM | POA: Diagnosis not present

## 2016-10-06 DIAGNOSIS — I25119 Atherosclerotic heart disease of native coronary artery with unspecified angina pectoris: Secondary | ICD-10-CM | POA: Diagnosis not present

## 2016-10-06 DIAGNOSIS — I10 Essential (primary) hypertension: Secondary | ICD-10-CM | POA: Diagnosis not present

## 2016-10-06 DIAGNOSIS — C20 Malignant neoplasm of rectum: Secondary | ICD-10-CM | POA: Diagnosis not present

## 2016-10-06 DIAGNOSIS — J439 Emphysema, unspecified: Secondary | ICD-10-CM | POA: Diagnosis not present

## 2016-10-07 DIAGNOSIS — I2510 Atherosclerotic heart disease of native coronary artery without angina pectoris: Secondary | ICD-10-CM | POA: Diagnosis not present

## 2016-10-07 DIAGNOSIS — Z1211 Encounter for screening for malignant neoplasm of colon: Secondary | ICD-10-CM | POA: Diagnosis not present

## 2016-10-07 DIAGNOSIS — I110 Hypertensive heart disease with heart failure: Secondary | ICD-10-CM | POA: Diagnosis not present

## 2016-10-07 DIAGNOSIS — F329 Major depressive disorder, single episode, unspecified: Secondary | ICD-10-CM | POA: Diagnosis not present

## 2016-10-07 DIAGNOSIS — I255 Ischemic cardiomyopathy: Secondary | ICD-10-CM | POA: Diagnosis not present

## 2016-10-07 DIAGNOSIS — K5909 Other constipation: Secondary | ICD-10-CM | POA: Diagnosis not present

## 2016-10-07 DIAGNOSIS — I509 Heart failure, unspecified: Secondary | ICD-10-CM | POA: Diagnosis not present

## 2016-10-07 DIAGNOSIS — Z85048 Personal history of other malignant neoplasm of rectum, rectosigmoid junction, and anus: Secondary | ICD-10-CM | POA: Diagnosis not present

## 2016-10-07 DIAGNOSIS — K5289 Other specified noninfective gastroenteritis and colitis: Secondary | ICD-10-CM | POA: Diagnosis not present

## 2016-10-13 ENCOUNTER — Other Ambulatory Visit (HOSPITAL_COMMUNITY): Payer: Commercial Managed Care - HMO

## 2016-10-13 ENCOUNTER — Ambulatory Visit (HOSPITAL_COMMUNITY): Payer: Commercial Managed Care - HMO | Admitting: Oncology

## 2016-10-13 DIAGNOSIS — K611 Rectal abscess: Secondary | ICD-10-CM | POA: Diagnosis not present

## 2016-10-13 DIAGNOSIS — I739 Peripheral vascular disease, unspecified: Secondary | ICD-10-CM | POA: Diagnosis not present

## 2016-10-13 DIAGNOSIS — I10 Essential (primary) hypertension: Secondary | ICD-10-CM | POA: Diagnosis not present

## 2016-10-13 DIAGNOSIS — C20 Malignant neoplasm of rectum: Secondary | ICD-10-CM | POA: Diagnosis not present

## 2016-10-13 DIAGNOSIS — G894 Chronic pain syndrome: Secondary | ICD-10-CM | POA: Diagnosis not present

## 2016-10-13 DIAGNOSIS — M6281 Muscle weakness (generalized): Secondary | ICD-10-CM | POA: Diagnosis not present

## 2016-10-13 DIAGNOSIS — J439 Emphysema, unspecified: Secondary | ICD-10-CM | POA: Diagnosis not present

## 2016-10-13 DIAGNOSIS — I25119 Atherosclerotic heart disease of native coronary artery with unspecified angina pectoris: Secondary | ICD-10-CM | POA: Diagnosis not present

## 2016-10-13 DIAGNOSIS — I480 Paroxysmal atrial fibrillation: Secondary | ICD-10-CM | POA: Diagnosis not present

## 2016-10-15 ENCOUNTER — Other Ambulatory Visit (HOSPITAL_COMMUNITY): Payer: Commercial Managed Care - HMO

## 2016-10-15 ENCOUNTER — Ambulatory Visit (HOSPITAL_COMMUNITY): Payer: Commercial Managed Care - HMO | Admitting: Oncology

## 2016-10-19 ENCOUNTER — Other Ambulatory Visit: Payer: Self-pay | Admitting: General Surgery

## 2016-10-19 DIAGNOSIS — K603 Anal fistula, unspecified: Secondary | ICD-10-CM

## 2016-10-21 DIAGNOSIS — I10 Essential (primary) hypertension: Secondary | ICD-10-CM | POA: Diagnosis not present

## 2016-10-21 DIAGNOSIS — J439 Emphysema, unspecified: Secondary | ICD-10-CM | POA: Diagnosis not present

## 2016-10-21 DIAGNOSIS — I480 Paroxysmal atrial fibrillation: Secondary | ICD-10-CM | POA: Diagnosis not present

## 2016-10-21 DIAGNOSIS — K611 Rectal abscess: Secondary | ICD-10-CM | POA: Diagnosis not present

## 2016-10-21 DIAGNOSIS — I25119 Atherosclerotic heart disease of native coronary artery with unspecified angina pectoris: Secondary | ICD-10-CM | POA: Diagnosis not present

## 2016-10-21 DIAGNOSIS — G894 Chronic pain syndrome: Secondary | ICD-10-CM | POA: Diagnosis not present

## 2016-10-21 DIAGNOSIS — I739 Peripheral vascular disease, unspecified: Secondary | ICD-10-CM | POA: Diagnosis not present

## 2016-10-21 DIAGNOSIS — C20 Malignant neoplasm of rectum: Secondary | ICD-10-CM | POA: Diagnosis not present

## 2016-10-21 DIAGNOSIS — M6281 Muscle weakness (generalized): Secondary | ICD-10-CM | POA: Diagnosis not present

## 2016-10-29 DIAGNOSIS — I25119 Atherosclerotic heart disease of native coronary artery with unspecified angina pectoris: Secondary | ICD-10-CM | POA: Diagnosis not present

## 2016-10-29 DIAGNOSIS — I739 Peripheral vascular disease, unspecified: Secondary | ICD-10-CM | POA: Diagnosis not present

## 2016-10-29 DIAGNOSIS — K611 Rectal abscess: Secondary | ICD-10-CM | POA: Diagnosis not present

## 2016-10-29 DIAGNOSIS — C20 Malignant neoplasm of rectum: Secondary | ICD-10-CM | POA: Diagnosis not present

## 2016-10-29 DIAGNOSIS — M6281 Muscle weakness (generalized): Secondary | ICD-10-CM | POA: Diagnosis not present

## 2016-10-29 DIAGNOSIS — G894 Chronic pain syndrome: Secondary | ICD-10-CM | POA: Diagnosis not present

## 2016-10-29 DIAGNOSIS — I480 Paroxysmal atrial fibrillation: Secondary | ICD-10-CM | POA: Diagnosis not present

## 2016-10-29 DIAGNOSIS — J439 Emphysema, unspecified: Secondary | ICD-10-CM | POA: Diagnosis not present

## 2016-10-29 DIAGNOSIS — I10 Essential (primary) hypertension: Secondary | ICD-10-CM | POA: Diagnosis not present

## 2016-11-02 ENCOUNTER — Other Ambulatory Visit (HOSPITAL_COMMUNITY): Payer: Self-pay | Admitting: Oncology

## 2016-11-02 ENCOUNTER — Telehealth (HOSPITAL_COMMUNITY): Payer: Self-pay | Admitting: *Deleted

## 2016-11-02 DIAGNOSIS — G894 Chronic pain syndrome: Secondary | ICD-10-CM

## 2016-11-02 MED ORDER — MORPHINE SULFATE ER 60 MG PO TBCR: 60.0000 mg | EXTENDED_RELEASE_TABLET | Freq: Three times a day (TID) | ORAL | 0 refills | Status: DC

## 2016-11-02 MED ORDER — OXYCODONE HCL 10 MG PO TABS: 10.0000 mg | ORAL_TABLET | ORAL | 0 refills | Status: DC | PRN

## 2016-11-02 NOTE — Telephone Encounter (Signed)
Rxs printed  TK

## 2016-11-04 ENCOUNTER — Telehealth: Payer: Self-pay | Admitting: Cardiology

## 2016-11-04 NOTE — Telephone Encounter (Signed)
Called  Juan Wheeler left message to call back Reviewing chart - patient had PCI in 2014 by Dr Martinique  STENT PLACED - LEFT CIRC. > [employer]ENT[employer] PROMUS STENT 3.0 X 12 MM  SERIAL # [id number] (bracket)L1(bracket)

## 2016-11-04 NOTE — Telephone Encounter (Signed)
Information given- verbalized understadning

## 2016-11-04 NOTE — Telephone Encounter (Signed)
New Message     She need the serial number for stint fo Pt to get a MRI, do you have access to this ??   Also sending her to medical record

## 2016-11-05 DIAGNOSIS — G894 Chronic pain syndrome: Secondary | ICD-10-CM | POA: Diagnosis not present

## 2016-11-05 DIAGNOSIS — C20 Malignant neoplasm of rectum: Secondary | ICD-10-CM | POA: Diagnosis not present

## 2016-11-05 DIAGNOSIS — I739 Peripheral vascular disease, unspecified: Secondary | ICD-10-CM | POA: Diagnosis not present

## 2016-11-05 DIAGNOSIS — I25119 Atherosclerotic heart disease of native coronary artery with unspecified angina pectoris: Secondary | ICD-10-CM | POA: Diagnosis not present

## 2016-11-05 DIAGNOSIS — M6281 Muscle weakness (generalized): Secondary | ICD-10-CM | POA: Diagnosis not present

## 2016-11-05 DIAGNOSIS — I10 Essential (primary) hypertension: Secondary | ICD-10-CM | POA: Diagnosis not present

## 2016-11-05 DIAGNOSIS — J439 Emphysema, unspecified: Secondary | ICD-10-CM | POA: Diagnosis not present

## 2016-11-05 DIAGNOSIS — I480 Paroxysmal atrial fibrillation: Secondary | ICD-10-CM | POA: Diagnosis not present

## 2016-11-05 DIAGNOSIS — K604 Rectal fistula: Secondary | ICD-10-CM | POA: Diagnosis not present

## 2016-11-09 ENCOUNTER — Encounter (HOSPITAL_COMMUNITY): Payer: Medicare HMO

## 2016-11-09 ENCOUNTER — Encounter (HOSPITAL_COMMUNITY): Payer: Self-pay | Admitting: Oncology

## 2016-11-09 ENCOUNTER — Encounter (HOSPITAL_COMMUNITY): Payer: Medicare HMO | Attending: Oncology | Admitting: Oncology

## 2016-11-09 DIAGNOSIS — Z9221 Personal history of antineoplastic chemotherapy: Secondary | ICD-10-CM | POA: Insufficient documentation

## 2016-11-09 DIAGNOSIS — Z809 Family history of malignant neoplasm, unspecified: Secondary | ICD-10-CM | POA: Diagnosis not present

## 2016-11-09 DIAGNOSIS — I10 Essential (primary) hypertension: Secondary | ICD-10-CM | POA: Insufficient documentation

## 2016-11-09 DIAGNOSIS — R918 Other nonspecific abnormal finding of lung field: Secondary | ICD-10-CM | POA: Diagnosis not present

## 2016-11-09 DIAGNOSIS — G894 Chronic pain syndrome: Secondary | ICD-10-CM | POA: Insufficient documentation

## 2016-11-09 DIAGNOSIS — G4700 Insomnia, unspecified: Secondary | ICD-10-CM | POA: Diagnosis not present

## 2016-11-09 DIAGNOSIS — Z9889 Other specified postprocedural states: Secondary | ICD-10-CM | POA: Diagnosis not present

## 2016-11-09 DIAGNOSIS — C20 Malignant neoplasm of rectum: Secondary | ICD-10-CM

## 2016-11-09 DIAGNOSIS — F17210 Nicotine dependence, cigarettes, uncomplicated: Secondary | ICD-10-CM | POA: Diagnosis not present

## 2016-11-09 DIAGNOSIS — Z720 Tobacco use: Secondary | ICD-10-CM

## 2016-11-09 LAB — COMPREHENSIVE METABOLIC PANEL
ALT: 15 U/L — ABNORMAL LOW (ref 17–63)
AST: 16 U/L (ref 15–41)
Albumin: 3.6 g/dL (ref 3.5–5.0)
Alkaline Phosphatase: 69 U/L (ref 38–126)
Anion gap: 7 (ref 5–15)
BUN: 10 mg/dL (ref 6–20)
CO2: 29 mmol/L (ref 22–32)
Calcium: 9 mg/dL (ref 8.9–10.3)
Chloride: 102 mmol/L (ref 101–111)
Creatinine, Ser: 1.32 mg/dL — ABNORMAL HIGH (ref 0.61–1.24)
GFR calc Af Amer: >60 mL/min (ref >60)
GFR calc non Af Amer: 59 mL/min — ABNORMAL LOW (ref >60)
Glucose, Bld: 110 mg/dL — ABNORMAL HIGH (ref 65–99)
Potassium: 4.8 mmol/L (ref 3.5–5.1)
Sodium: 138 mmol/L (ref 135–145)
Total Bilirubin: 0.5 mg/dL (ref 0.3–1.2)
Total Protein: 8.2 g/dL — ABNORMAL HIGH (ref 6.5–8.1)

## 2016-11-09 LAB — CBC WITH DIFFERENTIAL/PLATELET
Basophils Absolute: 0 10*3/uL (ref 0.0–0.1)
Basophils Relative: 0 %
Eosinophils Absolute: 0.3 10*3/uL (ref 0.0–0.7)
Eosinophils Relative: 3 %
HCT: 48.8 % (ref 39.0–52.0)
Hemoglobin: 16.6 g/dL (ref 13.0–17.0)
Lymphocytes Relative: 28 %
Lymphs Abs: 2.4 10*3/uL (ref 0.7–4.0)
MCH: 31.7 pg (ref 26.0–34.0)
MCHC: 34 g/dL (ref 30.0–36.0)
MCV: 93.1 fL (ref 78.0–100.0)
Monocytes Absolute: 0.7 10*3/uL (ref 0.1–1.0)
Monocytes Relative: 8 %
Neutro Abs: 5.3 10*3/uL (ref 1.7–7.7)
Neutrophils Relative %: 61 %
Platelets: 210 10*3/uL (ref 150–400)
RBC: 5.24 MIL/uL (ref 4.22–5.81)
RDW: 14.6 % (ref 11.5–15.5)
WBC: 8.6 10*3/uL (ref 4.0–10.5)

## 2016-11-09 MED ORDER — MORPHINE SULFATE ER 60 MG PO TBCR: 60.0000 mg | EXTENDED_RELEASE_TABLET | Freq: Three times a day (TID) | ORAL | 0 refills | Status: DC

## 2016-11-09 MED ORDER — OXYCODONE HCL 10 MG PO TABS: 10.0000 mg | ORAL_TABLET | ORAL | 0 refills | Status: DC | PRN
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
## 2016-11-09 NOTE — Assessment & Plan Note (Addendum)
Pulmonary nodules, stable.  CT chest in March 2017 is stable.  Another CT of chest is recommended in March 2018.    He asks if this can be postponed until May 2018 which is reasonable.  He reports a number of medical issues ongoing within the next few months.  If CT chest is stable, further surveillance imaging is not recommended.

## 2016-11-09 NOTE — Patient Instructions (Signed)
[place] at [facility] Discharge Instructions  RECOMMENDATIONS MADE BY THE CONSULTANT AND ANY TEST RESULTS WILL BE SENT TO YOUR REFERRING PHYSICIAN.  You were seen today by Kirby Crigler [name].  Refill given on pain meds today. Labs today will call you with results. CT in May 2018. Return in 3 months for follow up.   Thank you for choosing [place] at [facility] to provide your oncology and hematology care.  To afford each patient quality time with our provider, please arrive at least 15 minutes before your scheduled appointment time.    If you have a lab appointment with the [facility] please come in thru the  Main Entrance and check in at the main information desk  You need to re-schedule your appointment should you arrive 10 or more minutes late.  We strive to give you quality time with our providers, and arriving late affects you and other patients whose appointments are after yours.  Also, if you no show three or more times for appointments you may be dismissed from the clinic at the providers discretion.     Again, thank you for choosing [facility].  Our hope is that these requests will decrease the amount of time that you wait before being seen by our physicians.       _____________________________________________________________  Should you have questions after your visit to [facility], please contact our office at [phone] between the hours of 8:30 a.m. and 4:30 p.m.  Voicemails left after 4:30 p.m. will not be returned until the following business day.  For prescription refill requests, have your pharmacy contact our office.       Resources For Cancer Patients and their Caregivers ? American Cancer Society: Can assist with transportation, wigs, general needs, runs Look Good Feel Better.        9017151778 ? Cancer Care: Provides financial assistance, online support groups,  medication/co-pay assistance.  1-800-813-HOPE 872-873-0134) ? [employer] Assists [place] cancer patients and their families through emotional , educational and financial support.  8280263871 ? [place] DSS Where to apply for food stamps, Medicaid and utility assistance. 276-056-8976 ? [employer]: Transportation to medical appointments. 938-440-2089 ? Social Security Administration: May apply for disability if have a Stage IV cancer. 680-874-3119 (630)457-9518 ? [employer], Disability and Transit Services: Assists with nutrition, care and transit needs. [place] Support Programs: '@10RELATIVEDAYS'$ @ > Cancer Support Group  2nd Tuesday of the month 1pm-2pm, Journey Room  > Creative Journey  3rd Tuesday of the month 1130am-1pm, Journey Room  > Look Good Feel Better  1st Wednesday of the month 10am-12 noon, Journey Room (Call [facility] to register [phone])

## 2016-11-09 NOTE — Assessment & Plan Note (Signed)
Stage IIA rectal adenocarcinoma (uT3N0 clinical M0), ypT2pN0 after neoadjuvant chemotherapy, diagnosed and treated at [facility].  On initial presentation, his course was complicated or delayed secondary to finding on preoperative imaging showing a tracheal lesion and nasopharynx lesion as well as several subcentimeter pulmonary nodules. ENT evaluation revealed no evidence of neoplastic process in the trachea or nasopharynx and a biopsy of a lung nodule did not show any evidence of malignancy.  He is S/P concurrent chemo-RT with Xeloda as a radiosensitizer at a dose of 1500 mg p.o. B.i.d. Monday through Friday 02/03/2011 through 03/13/2011.  He then underwent a LAR with temporary loop ileostomy by [name] [facility] on 05/11/2011. Path showed residual invasive adenocarcinoma invasive into the muscular propria margins negative, ypT2pN0.  [facility] initially planned for adjuvant XELOX which we elected to forego as his course was complicated by uncontrolled pain. Has continued on surveillance since.    Labs today: CBC diff, CMET, CEA. . I personally reviewed and went over laboratory results with the patient.  The results are noted within this dictation.  I have printed Rx for his long-acting pain medication and short-acting pain medication.  [place] Controlled Substance Reporting System.    Labs in 6 months: CBC diff, CMET, CEA.  If CEA is normal, will consider discontinuing monitoring of CEA given his Stage II disease.   Return in 3 months for follow-up.

## 2016-11-09 NOTE — Assessment & Plan Note (Signed)
Chronic pain secondary to LAR for rectal cancer resulting in adjuvant treatment post-operatively with chemotherapy to be cancelled.  On MS Contin 60 mg every 8 hours and Oxycodone 10 mg 1-2 tablets every 4 hours PRN pain #100 with excellent pain control.  [place] Controlled Substance Reporting System is reviewed prior to prescribing.   Patient also on bowel regimen to maintain BMs and avoid constipation.  He denies any constipation and his bowels are well controlled with stool softeners.  Return in 3 months for follow-up.

## 2016-11-10 ENCOUNTER — Inpatient Hospital Stay
Admission: RE | Admit: 2016-11-10 | Discharge: 2016-11-10 | Disposition: A | Discharge status: Home / Self Care | Payer: Commercial Managed Care - HMO | Source: Ambulatory Visit | Attending: General Surgery | Admitting: General Surgery

## 2016-11-10 DIAGNOSIS — G894 Chronic pain syndrome: Secondary | ICD-10-CM | POA: Diagnosis not present

## 2016-11-10 DIAGNOSIS — I739 Peripheral vascular disease, unspecified: Secondary | ICD-10-CM | POA: Diagnosis not present

## 2016-11-10 DIAGNOSIS — K604 Rectal fistula: Secondary | ICD-10-CM | POA: Diagnosis not present

## 2016-11-10 DIAGNOSIS — I25119 Atherosclerotic heart disease of native coronary artery with unspecified angina pectoris: Secondary | ICD-10-CM | POA: Diagnosis not present

## 2016-11-10 DIAGNOSIS — C20 Malignant neoplasm of rectum: Secondary | ICD-10-CM | POA: Diagnosis not present

## 2016-11-10 DIAGNOSIS — M6281 Muscle weakness (generalized): Secondary | ICD-10-CM | POA: Diagnosis not present

## 2016-11-10 DIAGNOSIS — J439 Emphysema, unspecified: Secondary | ICD-10-CM | POA: Diagnosis not present

## 2016-11-10 DIAGNOSIS — I480 Paroxysmal atrial fibrillation: Secondary | ICD-10-CM | POA: Diagnosis not present

## 2016-11-10 DIAGNOSIS — I10 Essential (primary) hypertension: Secondary | ICD-10-CM | POA: Diagnosis not present

## 2016-11-10 LAB — CEA: CEA: 4.1 ng/mL (ref 0.0–4.7)

## 2016-11-17 ENCOUNTER — Ambulatory Visit
Admission: RE | Admit: 2016-11-17 | Discharge: 2016-11-17 | Disposition: A | Discharge status: Home / Self Care | Payer: Medicare HMO | Source: Ambulatory Visit | Attending: General Surgery | Admitting: General Surgery

## 2016-11-17 DIAGNOSIS — I25119 Atherosclerotic heart disease of native coronary artery with unspecified angina pectoris: Secondary | ICD-10-CM | POA: Diagnosis not present

## 2016-11-17 DIAGNOSIS — G894 Chronic pain syndrome: Secondary | ICD-10-CM | POA: Diagnosis not present

## 2016-11-17 DIAGNOSIS — I739 Peripheral vascular disease, unspecified: Secondary | ICD-10-CM | POA: Diagnosis not present

## 2016-11-17 DIAGNOSIS — K603 Anal fistula: Secondary | ICD-10-CM

## 2016-11-17 DIAGNOSIS — I480 Paroxysmal atrial fibrillation: Secondary | ICD-10-CM | POA: Diagnosis not present

## 2016-11-17 DIAGNOSIS — K611 Rectal abscess: Secondary | ICD-10-CM | POA: Diagnosis not present

## 2016-11-17 DIAGNOSIS — C20 Malignant neoplasm of rectum: Secondary | ICD-10-CM | POA: Diagnosis not present

## 2016-11-17 DIAGNOSIS — K604 Rectal fistula: Secondary | ICD-10-CM | POA: Diagnosis not present

## 2016-11-17 DIAGNOSIS — M6281 Muscle weakness (generalized): Secondary | ICD-10-CM | POA: Diagnosis not present

## 2016-11-17 DIAGNOSIS — I10 Essential (primary) hypertension: Secondary | ICD-10-CM | POA: Diagnosis not present

## 2016-11-17 DIAGNOSIS — J439 Emphysema, unspecified: Secondary | ICD-10-CM | POA: Diagnosis not present

## 2016-11-17 MED ORDER — GADOBENATE DIMEGLUMINE 529 MG/ML IV SOLN
17.0000 mL | Freq: Once | INTRAVENOUS | Status: AC | PRN
  Administered 2016-11-17: 17 mL via INTRAVENOUS

## 2016-11-26 DIAGNOSIS — J439 Emphysema, unspecified: Secondary | ICD-10-CM | POA: Diagnosis not present

## 2016-11-26 DIAGNOSIS — I25119 Atherosclerotic heart disease of native coronary artery with unspecified angina pectoris: Secondary | ICD-10-CM | POA: Diagnosis not present

## 2016-11-26 DIAGNOSIS — M6281 Muscle weakness (generalized): Secondary | ICD-10-CM | POA: Diagnosis not present

## 2016-11-26 DIAGNOSIS — G894 Chronic pain syndrome: Secondary | ICD-10-CM | POA: Diagnosis not present

## 2016-11-26 DIAGNOSIS — I10 Essential (primary) hypertension: Secondary | ICD-10-CM | POA: Diagnosis not present

## 2016-11-26 DIAGNOSIS — I739 Peripheral vascular disease, unspecified: Secondary | ICD-10-CM | POA: Diagnosis not present

## 2016-11-26 DIAGNOSIS — K604 Rectal fistula: Secondary | ICD-10-CM | POA: Diagnosis not present

## 2016-11-26 DIAGNOSIS — I480 Paroxysmal atrial fibrillation: Secondary | ICD-10-CM | POA: Diagnosis not present

## 2016-11-26 DIAGNOSIS — C20 Malignant neoplasm of rectum: Secondary | ICD-10-CM | POA: Diagnosis not present

## 2016-12-02 DIAGNOSIS — C20 Malignant neoplasm of rectum: Secondary | ICD-10-CM | POA: Diagnosis not present

## 2016-12-02 DIAGNOSIS — K611 Rectal abscess: Secondary | ICD-10-CM | POA: Diagnosis not present

## 2016-12-03 DIAGNOSIS — C20 Malignant neoplasm of rectum: Secondary | ICD-10-CM | POA: Diagnosis not present

## 2016-12-03 DIAGNOSIS — K604 Rectal fistula: Secondary | ICD-10-CM | POA: Diagnosis not present

## 2016-12-03 DIAGNOSIS — I25119 Atherosclerotic heart disease of native coronary artery with unspecified angina pectoris: Secondary | ICD-10-CM | POA: Diagnosis not present

## 2016-12-03 DIAGNOSIS — J439 Emphysema, unspecified: Secondary | ICD-10-CM | POA: Diagnosis not present

## 2016-12-03 DIAGNOSIS — I739 Peripheral vascular disease, unspecified: Secondary | ICD-10-CM | POA: Diagnosis not present

## 2016-12-03 DIAGNOSIS — I480 Paroxysmal atrial fibrillation: Secondary | ICD-10-CM | POA: Diagnosis not present

## 2016-12-03 DIAGNOSIS — I10 Essential (primary) hypertension: Secondary | ICD-10-CM | POA: Diagnosis not present

## 2016-12-03 DIAGNOSIS — G894 Chronic pain syndrome: Secondary | ICD-10-CM | POA: Diagnosis not present

## 2016-12-03 DIAGNOSIS — M6281 Muscle weakness (generalized): Secondary | ICD-10-CM | POA: Diagnosis not present

## 2016-12-04 DIAGNOSIS — K604 Rectal fistula: Secondary | ICD-10-CM | POA: Diagnosis not present

## 2016-12-08 DIAGNOSIS — I739 Peripheral vascular disease, unspecified: Secondary | ICD-10-CM | POA: Diagnosis not present

## 2016-12-08 DIAGNOSIS — M6281 Muscle weakness (generalized): Secondary | ICD-10-CM | POA: Diagnosis not present

## 2016-12-08 DIAGNOSIS — K604 Rectal fistula: Secondary | ICD-10-CM | POA: Diagnosis not present

## 2016-12-08 DIAGNOSIS — J439 Emphysema, unspecified: Secondary | ICD-10-CM | POA: Diagnosis not present

## 2016-12-08 DIAGNOSIS — I10 Essential (primary) hypertension: Secondary | ICD-10-CM | POA: Diagnosis not present

## 2016-12-08 DIAGNOSIS — C20 Malignant neoplasm of rectum: Secondary | ICD-10-CM | POA: Diagnosis not present

## 2016-12-08 DIAGNOSIS — I25119 Atherosclerotic heart disease of native coronary artery with unspecified angina pectoris: Secondary | ICD-10-CM | POA: Diagnosis not present

## 2016-12-08 DIAGNOSIS — I480 Paroxysmal atrial fibrillation: Secondary | ICD-10-CM | POA: Diagnosis not present

## 2016-12-08 DIAGNOSIS — G894 Chronic pain syndrome: Secondary | ICD-10-CM | POA: Diagnosis not present

## 2016-12-08 DIAGNOSIS — S31829A Unspecified open wound of left buttock, initial encounter: Secondary | ICD-10-CM | POA: Diagnosis not present

## 2016-12-17 DIAGNOSIS — I739 Peripheral vascular disease, unspecified: Secondary | ICD-10-CM | POA: Diagnosis not present

## 2016-12-17 DIAGNOSIS — I480 Paroxysmal atrial fibrillation: Secondary | ICD-10-CM | POA: Diagnosis not present

## 2016-12-17 DIAGNOSIS — I10 Essential (primary) hypertension: Secondary | ICD-10-CM | POA: Diagnosis not present

## 2016-12-17 DIAGNOSIS — G894 Chronic pain syndrome: Secondary | ICD-10-CM | POA: Diagnosis not present

## 2016-12-17 DIAGNOSIS — C20 Malignant neoplasm of rectum: Secondary | ICD-10-CM | POA: Diagnosis not present

## 2016-12-17 DIAGNOSIS — J439 Emphysema, unspecified: Secondary | ICD-10-CM | POA: Diagnosis not present

## 2016-12-17 DIAGNOSIS — M6281 Muscle weakness (generalized): Secondary | ICD-10-CM | POA: Diagnosis not present

## 2016-12-17 DIAGNOSIS — K604 Rectal fistula: Secondary | ICD-10-CM | POA: Diagnosis not present

## 2016-12-17 DIAGNOSIS — I25119 Atherosclerotic heart disease of native coronary artery with unspecified angina pectoris: Secondary | ICD-10-CM | POA: Diagnosis not present

## 2016-12-21 ENCOUNTER — Ambulatory Visit (HOSPITAL_COMMUNITY): Payer: No Typology Code available for payment source

## 2016-12-22 DIAGNOSIS — K604 Rectal fistula: Secondary | ICD-10-CM | POA: Diagnosis not present

## 2016-12-22 DIAGNOSIS — I10 Essential (primary) hypertension: Secondary | ICD-10-CM | POA: Diagnosis not present

## 2016-12-22 DIAGNOSIS — C20 Malignant neoplasm of rectum: Secondary | ICD-10-CM | POA: Diagnosis not present

## 2016-12-22 DIAGNOSIS — G894 Chronic pain syndrome: Secondary | ICD-10-CM | POA: Diagnosis not present

## 2016-12-22 DIAGNOSIS — I739 Peripheral vascular disease, unspecified: Secondary | ICD-10-CM | POA: Diagnosis not present

## 2016-12-22 DIAGNOSIS — I480 Paroxysmal atrial fibrillation: Secondary | ICD-10-CM | POA: Diagnosis not present

## 2016-12-22 DIAGNOSIS — J439 Emphysema, unspecified: Secondary | ICD-10-CM | POA: Diagnosis not present

## 2016-12-22 DIAGNOSIS — I25119 Atherosclerotic heart disease of native coronary artery with unspecified angina pectoris: Secondary | ICD-10-CM | POA: Diagnosis not present

## 2016-12-22 DIAGNOSIS — M6281 Muscle weakness (generalized): Secondary | ICD-10-CM | POA: Diagnosis not present

## 2016-12-24 DIAGNOSIS — I2510 Atherosclerotic heart disease of native coronary artery without angina pectoris: Secondary | ICD-10-CM | POA: Diagnosis not present

## 2016-12-24 DIAGNOSIS — I10 Essential (primary) hypertension: Secondary | ICD-10-CM | POA: Diagnosis not present

## 2016-12-24 DIAGNOSIS — C20 Malignant neoplasm of rectum: Secondary | ICD-10-CM | POA: Diagnosis not present

## 2016-12-24 DIAGNOSIS — Z933 Colostomy status: Secondary | ICD-10-CM | POA: Diagnosis not present

## 2016-12-25 DIAGNOSIS — S31829A Unspecified open wound of left buttock, initial encounter: Secondary | ICD-10-CM | POA: Diagnosis not present

## 2016-12-25 DIAGNOSIS — C20 Malignant neoplasm of rectum: Secondary | ICD-10-CM | POA: Diagnosis not present

## 2016-12-25 DIAGNOSIS — K611 Rectal abscess: Secondary | ICD-10-CM | POA: Diagnosis not present

## 2016-12-28 DIAGNOSIS — I739 Peripheral vascular disease, unspecified: Secondary | ICD-10-CM | POA: Diagnosis not present

## 2016-12-28 DIAGNOSIS — I25119 Atherosclerotic heart disease of native coronary artery with unspecified angina pectoris: Secondary | ICD-10-CM | POA: Diagnosis not present

## 2016-12-28 DIAGNOSIS — I10 Essential (primary) hypertension: Secondary | ICD-10-CM | POA: Diagnosis not present

## 2016-12-28 DIAGNOSIS — G894 Chronic pain syndrome: Secondary | ICD-10-CM | POA: Diagnosis not present

## 2016-12-28 DIAGNOSIS — C20 Malignant neoplasm of rectum: Secondary | ICD-10-CM | POA: Diagnosis not present

## 2016-12-28 DIAGNOSIS — J439 Emphysema, unspecified: Secondary | ICD-10-CM | POA: Diagnosis not present

## 2016-12-28 DIAGNOSIS — K604 Rectal fistula: Secondary | ICD-10-CM | POA: Diagnosis not present

## 2016-12-28 DIAGNOSIS — M6281 Muscle weakness (generalized): Secondary | ICD-10-CM | POA: Diagnosis not present

## 2016-12-28 DIAGNOSIS — I480 Paroxysmal atrial fibrillation: Secondary | ICD-10-CM | POA: Diagnosis not present

## 2017-01-08 DIAGNOSIS — Z933 Colostomy status: Secondary | ICD-10-CM | POA: Diagnosis not present

## 2017-01-11 ENCOUNTER — Encounter: Payer: Self-pay | Admitting: Cardiology

## 2017-01-14 DIAGNOSIS — Z79899 Other long term (current) drug therapy: Secondary | ICD-10-CM | POA: Diagnosis not present

## 2017-01-14 DIAGNOSIS — Z7982 Long term (current) use of aspirin: Secondary | ICD-10-CM | POA: Diagnosis not present

## 2017-01-14 DIAGNOSIS — Z885 Allergy status to narcotic agent status: Secondary | ICD-10-CM | POA: Diagnosis not present

## 2017-01-14 DIAGNOSIS — Z85048 Personal history of other malignant neoplasm of rectum, rectosigmoid junction, and anus: Secondary | ICD-10-CM | POA: Diagnosis not present

## 2017-01-14 DIAGNOSIS — I110 Hypertensive heart disease with heart failure: Secondary | ICD-10-CM | POA: Diagnosis not present

## 2017-01-14 DIAGNOSIS — Z7901 Long term (current) use of anticoagulants: Secondary | ICD-10-CM | POA: Diagnosis not present

## 2017-01-14 DIAGNOSIS — F17210 Nicotine dependence, cigarettes, uncomplicated: Secondary | ICD-10-CM | POA: Diagnosis not present

## 2017-01-14 DIAGNOSIS — K603 Anal fistula: Secondary | ICD-10-CM | POA: Diagnosis not present

## 2017-01-14 DIAGNOSIS — I509 Heart failure, unspecified: Secondary | ICD-10-CM | POA: Diagnosis not present

## 2017-01-14 DIAGNOSIS — I480 Paroxysmal atrial fibrillation: Secondary | ICD-10-CM | POA: Diagnosis not present

## 2017-01-21 DIAGNOSIS — L89159 Pressure ulcer of sacral region, unspecified stage: Secondary | ICD-10-CM | POA: Diagnosis not present

## 2017-01-28 ENCOUNTER — Ambulatory Visit (HOSPITAL_COMMUNITY)
Admission: RE | Admit: 2017-01-28 | Discharge: 2017-01-28 | Disposition: A | Discharge status: Home / Self Care | Payer: Medicare HMO | Source: Ambulatory Visit | Attending: Oncology | Admitting: Oncology

## 2017-01-28 DIAGNOSIS — C20 Malignant neoplasm of rectum: Secondary | ICD-10-CM | POA: Insufficient documentation

## 2017-01-28 DIAGNOSIS — J432 Centrilobular emphysema: Secondary | ICD-10-CM | POA: Insufficient documentation

## 2017-01-28 DIAGNOSIS — R918 Other nonspecific abnormal finding of lung field: Secondary | ICD-10-CM | POA: Diagnosis not present

## 2017-01-28 DIAGNOSIS — Z720 Tobacco use: Secondary | ICD-10-CM | POA: Diagnosis not present

## 2017-01-28 DIAGNOSIS — C61 Malignant neoplasm of prostate: Secondary | ICD-10-CM | POA: Diagnosis not present

## 2017-02-05 ENCOUNTER — Encounter (HOSPITAL_COMMUNITY): Payer: Medicare HMO | Attending: Oncology | Admitting: Oncology

## 2017-02-05 ENCOUNTER — Encounter (HOSPITAL_COMMUNITY): Payer: Self-pay

## 2017-02-05 VITALS — BP 159/81 | HR 90 | Temp 97.8°F | Resp 18 | Wt 180.4 lb

## 2017-02-05 DIAGNOSIS — Z720 Tobacco use: Secondary | ICD-10-CM

## 2017-02-05 DIAGNOSIS — K611 Rectal abscess: Secondary | ICD-10-CM

## 2017-02-05 DIAGNOSIS — R918 Other nonspecific abnormal finding of lung field: Secondary | ICD-10-CM | POA: Diagnosis not present

## 2017-02-05 DIAGNOSIS — F17210 Nicotine dependence, cigarettes, uncomplicated: Secondary | ICD-10-CM | POA: Insufficient documentation

## 2017-02-05 DIAGNOSIS — C20 Malignant neoplasm of rectum: Secondary | ICD-10-CM | POA: Diagnosis not present

## 2017-02-05 DIAGNOSIS — Z9221 Personal history of antineoplastic chemotherapy: Secondary | ICD-10-CM | POA: Insufficient documentation

## 2017-02-05 DIAGNOSIS — Z9889 Other specified postprocedural states: Secondary | ICD-10-CM | POA: Insufficient documentation

## 2017-02-05 DIAGNOSIS — G894 Chronic pain syndrome: Secondary | ICD-10-CM | POA: Diagnosis not present

## 2017-02-05 DIAGNOSIS — Z809 Family history of malignant neoplasm, unspecified: Secondary | ICD-10-CM | POA: Insufficient documentation

## 2017-02-05 DIAGNOSIS — I10 Essential (primary) hypertension: Secondary | ICD-10-CM | POA: Insufficient documentation

## 2017-02-05 MED ORDER — OXYCODONE HCL 10 MG PO TABS: 10.0000 mg | ORAL_TABLET | ORAL | 0 refills | Status: DC | PRN

## 2017-02-05 MED ORDER — MORPHINE SULFATE ER 60 MG PO TBCR: 60.0000 mg | EXTENDED_RELEASE_TABLET | Freq: Three times a day (TID) | ORAL | 0 refills | Status: DC
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
## 2017-02-05 NOTE — Patient Instructions (Addendum)
[place] at [facility] Discharge Instructions  RECOMMENDATIONS MADE BY THE CONSULTANT AND ANY TEST RESULTS WILL BE SENT TO YOUR REFERRING PHYSICIAN.  You were seen today by Dr. Twana First Follow up as needed We refilled your pain medication, however, you need to get in with your primary care physician to have this filled from now on.   Thank you for choosing [place] at [facility] to provide your oncology and hematology care.  To afford each patient quality time with our provider, please arrive at least 15 minutes before your scheduled appointment time.    If you have a lab appointment with the [facility] please come in thru the  Main Entrance and check in at the main information desk  You need to re-schedule your appointment should you arrive 10 or more minutes late.  We strive to give you quality time with our providers, and arriving late affects you and other patients whose appointments are after yours.  Also, if you no show three or more times for appointments you may be dismissed from the clinic at the providers discretion.     Again, thank you for choosing [facility].  Our hope is that these requests will decrease the amount of time that you wait before being seen by our physicians.       _____________________________________________________________  Should you have questions after your visit to [facility], please contact our office at [phone] between the hours of 8:30 a.m. and 4:30 p.m.  Voicemails left after 4:30 p.m. will not be returned until the following business day.  For prescription refill requests, have your pharmacy contact our office.       Resources For Cancer Patients and their Caregivers ? American Cancer Society: Can assist with transportation, wigs, general needs, runs Look Good Feel Better.        201-053-2065 ? Cancer Care: Provides financial assistance, online support  groups, medication/co-pay assistance.  1-800-813-HOPE 684-697-7259) ? [place] Assists [place] cancer patients and their families through emotional , educational and financial support.  469 481 2709 ? [place] DSS Where to apply for food stamps, Medicaid and utility assistance. 608-625-9114 ? [facility]: Transportation to medical appointments. (302)112-8205 ? Social Security Administration: May apply for disability if have a Stage IV cancer. 323-447-8450 928-377-7218 ? [employer], Disability and Transit Services: Assists with nutrition, care and transit needs. [place] Support Programs: '@10RELATIVEDAYS'$ @ > Cancer Support Group  2nd Tuesday of the month 1pm-2pm, Journey Room  > Creative Journey  3rd Tuesday of the month 1130am-1pm, Journey Room  > Look Good Feel Better  1st Wednesday of the month 10am-12 noon, Journey Room (Call [place] to register [phone])

## 2017-02-09 DIAGNOSIS — M47896 Other spondylosis, lumbar region: Secondary | ICD-10-CM | POA: Diagnosis not present

## 2017-02-17 DIAGNOSIS — Z933 Colostomy status: Secondary | ICD-10-CM | POA: Diagnosis not present

## 2017-02-17 DIAGNOSIS — L89159 Pressure ulcer of sacral region, unspecified stage: Secondary | ICD-10-CM | POA: Diagnosis not present

## 2017-02-23 DIAGNOSIS — L89159 Pressure ulcer of sacral region, unspecified stage: Secondary | ICD-10-CM | POA: Diagnosis not present

## 2017-02-23 DIAGNOSIS — Z933 Colostomy status: Secondary | ICD-10-CM | POA: Diagnosis not present

## 2017-02-24 DIAGNOSIS — L89159 Pressure ulcer of sacral region, unspecified stage: Secondary | ICD-10-CM | POA: Diagnosis not present

## 2017-02-24 DIAGNOSIS — Z933 Colostomy status: Secondary | ICD-10-CM | POA: Diagnosis not present

## 2017-03-22 DIAGNOSIS — L89159 Pressure ulcer of sacral region, unspecified stage: Secondary | ICD-10-CM | POA: Diagnosis not present

## 2017-03-22 DIAGNOSIS — Z933 Colostomy status: Secondary | ICD-10-CM | POA: Diagnosis not present

## 2017-03-23 ENCOUNTER — Encounter (HOSPITAL_COMMUNITY): Payer: Self-pay | Admitting: Adult Health

## 2017-03-23 ENCOUNTER — Other Ambulatory Visit (HOSPITAL_COMMUNITY): Payer: Self-pay | Admitting: Adult Health

## 2017-03-23 ENCOUNTER — Telehealth (HOSPITAL_COMMUNITY): Payer: Self-pay | Admitting: *Deleted

## 2017-03-23 DIAGNOSIS — Z933 Colostomy status: Secondary | ICD-10-CM | POA: Diagnosis not present

## 2017-03-23 DIAGNOSIS — I2510 Atherosclerotic heart disease of native coronary artery without angina pectoris: Secondary | ICD-10-CM | POA: Diagnosis not present

## 2017-03-23 DIAGNOSIS — C20 Malignant neoplasm of rectum: Secondary | ICD-10-CM | POA: Diagnosis not present

## 2017-03-23 DIAGNOSIS — G894 Chronic pain syndrome: Secondary | ICD-10-CM

## 2017-03-23 DIAGNOSIS — I10 Essential (primary) hypertension: Secondary | ICD-10-CM | POA: Diagnosis not present

## 2017-03-23 MED ORDER — MORPHINE SULFATE ER 60 MG PO TBCR: 60.0000 mg | EXTENDED_RELEASE_TABLET | Freq: Three times a day (TID) | ORAL | 0 refills | Status: DC

## 2017-03-23 MED ORDER — OXYCODONE HCL 10 MG PO TABS: 10.0000 mg | ORAL_TABLET | ORAL | 0 refills | Status: DC | PRN

## 2017-03-23 NOTE — Progress Notes (Signed)
Patient called cancer center requesting refill of Oxycodone & MS Contin.   [place] Controlled Substance Reporting System reviewed and refill is appropriate on or after 03/28/17. Paper prescriptions printed & post-dated; Rxs left at cancer center front desk for patient to retrieve after showing photo ID per clinic policy.   NCCSRS reviewed:     Mike Craze, NP [place] [phone]

## 2017-03-23 NOTE — Telephone Encounter (Signed)
Printed Rx  gwd

## 2017-03-26 DIAGNOSIS — L89159 Pressure ulcer of sacral region, unspecified stage: Secondary | ICD-10-CM | POA: Diagnosis not present

## 2017-03-26 DIAGNOSIS — Z933 Colostomy status: Secondary | ICD-10-CM | POA: Diagnosis not present

## 2017-03-29 DIAGNOSIS — L89159 Pressure ulcer of sacral region, unspecified stage: Secondary | ICD-10-CM | POA: Diagnosis not present

## 2017-03-29 DIAGNOSIS — Z933 Colostomy status: Secondary | ICD-10-CM | POA: Diagnosis not present

## 2017-04-21 DIAGNOSIS — Z933 Colostomy status: Secondary | ICD-10-CM | POA: Diagnosis not present

## 2017-04-21 DIAGNOSIS — C20 Malignant neoplasm of rectum: Secondary | ICD-10-CM | POA: Diagnosis not present

## 2017-04-21 DIAGNOSIS — L89159 Pressure ulcer of sacral region, unspecified stage: Secondary | ICD-10-CM | POA: Diagnosis not present

## 2017-04-26 ENCOUNTER — Other Ambulatory Visit (HOSPITAL_COMMUNITY): Payer: Self-pay | Admitting: Oncology

## 2017-04-26 DIAGNOSIS — G894 Chronic pain syndrome: Secondary | ICD-10-CM

## 2017-04-26 MED ORDER — MORPHINE SULFATE ER 60 MG PO TBCR: 60.0000 mg | EXTENDED_RELEASE_TABLET | Freq: Three times a day (TID) | ORAL | 0 refills | Status: DC

## 2017-04-26 MED ORDER — OXYCODONE HCL 10 MG PO TABS: 10.0000 mg | ORAL_TABLET | ORAL | 0 refills | Status: DC | PRN

## 2017-04-27 DIAGNOSIS — Z933 Colostomy status: Secondary | ICD-10-CM | POA: Diagnosis not present

## 2017-04-27 DIAGNOSIS — L89159 Pressure ulcer of sacral region, unspecified stage: Secondary | ICD-10-CM | POA: Diagnosis not present

## 2017-04-29 DIAGNOSIS — L89159 Pressure ulcer of sacral region, unspecified stage: Secondary | ICD-10-CM | POA: Diagnosis not present

## 2017-04-29 DIAGNOSIS — Z933 Colostomy status: Secondary | ICD-10-CM | POA: Diagnosis not present

## 2017-05-18 ENCOUNTER — Encounter: Payer: Self-pay | Admitting: Cardiology

## 2017-05-22 DIAGNOSIS — Z933 Colostomy status: Secondary | ICD-10-CM | POA: Diagnosis not present

## 2017-05-22 DIAGNOSIS — L89159 Pressure ulcer of sacral region, unspecified stage: Secondary | ICD-10-CM | POA: Diagnosis not present

## 2017-05-25 ENCOUNTER — Telehealth (HOSPITAL_COMMUNITY): Payer: Self-pay | Admitting: *Deleted

## 2017-05-26 ENCOUNTER — Encounter (HOSPITAL_COMMUNITY): Payer: Self-pay | Admitting: Adult Health

## 2017-05-26 ENCOUNTER — Other Ambulatory Visit (HOSPITAL_COMMUNITY): Payer: Self-pay | Admitting: Adult Health

## 2017-05-26 DIAGNOSIS — G894 Chronic pain syndrome: Secondary | ICD-10-CM

## 2017-05-26 MED ORDER — OXYCODONE HCL 10 MG PO TABS: 10.0000 mg | ORAL_TABLET | ORAL | 0 refills | Status: DC | PRN

## 2017-05-26 MED ORDER — MORPHINE SULFATE ER 60 MG PO TBCR: 60.0000 mg | EXTENDED_RELEASE_TABLET | Freq: Three times a day (TID) | ORAL | 0 refills | Status: DC

## 2017-05-26 NOTE — Progress Notes (Signed)
Patient called cancer center requesting refill of MS Contin and Oxycodone.   [place] Controlled Substance Reporting System reviewed and refills are appropriate on or after 05/28/17 for both medications. Paper prescription printed & post-dated; Rx left at cancer center front desk for patient to retrieve after showing photo ID per clinic policy.   NCCSRS reviewed:     Mike Craze, NP [place] [phone]

## 2017-05-26 NOTE — Telephone Encounter (Signed)
Rx printed  gwd

## 2017-05-31 DIAGNOSIS — L89159 Pressure ulcer of sacral region, unspecified stage: Secondary | ICD-10-CM | POA: Diagnosis not present

## 2017-05-31 DIAGNOSIS — Z933 Colostomy status: Secondary | ICD-10-CM | POA: Diagnosis not present

## 2017-06-01 DIAGNOSIS — L89159 Pressure ulcer of sacral region, unspecified stage: Secondary | ICD-10-CM | POA: Diagnosis not present

## 2017-06-01 DIAGNOSIS — Z933 Colostomy status: Secondary | ICD-10-CM | POA: Diagnosis not present

## 2017-06-22 DIAGNOSIS — C20 Malignant neoplasm of rectum: Secondary | ICD-10-CM | POA: Diagnosis not present

## 2017-06-22 DIAGNOSIS — I10 Essential (primary) hypertension: Secondary | ICD-10-CM | POA: Diagnosis not present

## 2017-06-22 DIAGNOSIS — I82401 Acute embolism and thrombosis of unspecified deep veins of right lower extremity: Secondary | ICD-10-CM | POA: Diagnosis not present

## 2017-06-22 DIAGNOSIS — I2510 Atherosclerotic heart disease of native coronary artery without angina pectoris: Secondary | ICD-10-CM | POA: Diagnosis not present

## 2017-06-22 DIAGNOSIS — Z23 Encounter for immunization: Secondary | ICD-10-CM | POA: Diagnosis not present

## 2017-06-24 ENCOUNTER — Other Ambulatory Visit (HOSPITAL_COMMUNITY): Payer: Self-pay | Admitting: Oncology

## 2017-06-24 DIAGNOSIS — G894 Chronic pain syndrome: Secondary | ICD-10-CM

## 2017-06-24 MED ORDER — OXYCODONE HCL 10 MG PO TABS: 10.0000 mg | ORAL_TABLET | ORAL | 0 refills | Status: DC | PRN

## 2017-06-25 ENCOUNTER — Telehealth (HOSPITAL_COMMUNITY): Payer: Self-pay | Admitting: *Deleted

## 2017-06-28 ENCOUNTER — Other Ambulatory Visit (HOSPITAL_COMMUNITY): Payer: Self-pay

## 2017-06-28 DIAGNOSIS — G894 Chronic pain syndrome: Secondary | ICD-10-CM

## 2017-06-28 MED ORDER — MORPHINE SULFATE ER 60 MG PO TBCR: 60.0000 mg | EXTENDED_RELEASE_TABLET | Freq: Three times a day (TID) | ORAL | 0 refills | Status: DC

## 2017-07-06 ENCOUNTER — Ambulatory Visit (INDEPENDENT_AMBULATORY_CARE_PROVIDER_SITE_OTHER): Payer: Medicare HMO | Admitting: General Surgery

## 2017-07-06 ENCOUNTER — Ambulatory Visit: Payer: Medicare HMO | Admitting: General Surgery

## 2017-07-06 ENCOUNTER — Encounter: Payer: Self-pay | Admitting: General Surgery

## 2017-07-06 VITALS — BP 141/80 | HR 88 | Temp 98.2°F | Resp 18 | Ht 69.0 in | Wt 176.0 lb

## 2017-07-06 DIAGNOSIS — K603 Anal fistula: Secondary | ICD-10-CM | POA: Diagnosis not present

## 2017-07-06 NOTE — Progress Notes (Signed)
Juan Wheeler; Juan Wheeler; 1960/10/18   HPI Patient is a 56 year old black male who was referred to my care for a draining wound from his left buttock. It is been present for almost a year. This is following extensive surgery following incision and drainage of a perirectal abscess that extended up to the pelvis. He was noted to have a history of rectal cancer and ultimately required multiple abdominal surgeries. He has been followed by Dr. Cheryll Cockayne for treatment of his rectal cancer. He last saw him in April 2018. Patient did not mention the draining sinus in his left buttock at that visit. He denies any fevers. He currently has a pain level is 6 out of 10. Past Medical History:  Diagnosis Date  . Chronic lower back pain    a. Followed by pain management at [facility].  . Colon cancer [facility])    rectal cancer  . Coronary artery disease    a. 03/2013: abnl nuc -> LHC s/p DES to LCx, residual moderate disease in LAD (med rx unless refractory angina). b. Not on BB due to bradycardia.  Marland Kitchen DVT (deep venous thrombosis) ([place]) ~ 2013  . Dysrhythmia    AFib  . GERD (gastroesophageal reflux disease)   . H/O necrotising fasciitis   . History of blood transfusion    "once; after throwing up alot of blood" (04/17/2013)  . Hypertension   . LV dysfunction    a. EF 45% in 03/2013.  Marland Kitchen PAD (peripheral artery disease) ([place])    a. Occlusion of the right internal iliac artery, with significant atherosclerosis in the left internal iliac which was not amenable to reconstruction per notes from [facility].  . Pulmonary nodules 09/30/2014  . Rectal cancer ([place])   . Tobacco abuse     Past Surgical History:  Procedure Laterality Date  . ABDOMINAL SURGERY  1990's   'for stomach ulcers" (04/17/2013)  . COLECTOMY  2012   "for rectal cancer" (04/17/2013)  . COLONOSCOPY  2013   Dr. Cheryll Cockayne: colorectal anastomosis with ulcer and inflammation, benign biopsy  . COLONOSCOPY WITH PROPOFOL N/A 09/07/2016   Procedure: COLONOSCOPY WITH PROPOFOL;  Surgeon: Daneil Dolin, MD;  [facility];  Service: Endoscopy;  Laterality: N/A;  10:00 am Colonoscopy via rectum and ostomy  . COLOSTOMY TAKEDOWN  2013  . CORONARY ANGIOPLASTY WITH STENT PLACEMENT  04/17/2013   "?1" (04/17/2013)  . FEMORAL-POPLITEAL BYPASS GRAFT Left 07/02/2015   Procedure: BYPASS GRAFT LEFT COMMON FEMORAL ARTERY TO LEFT ABOVE KNEE POPLITEAL ARTERY - USING LEFT GREATER SAPPHENOUS VEIN;  Surgeon: Elam Dutch, MD;  Location: [place];  Service: Vascular;  Laterality: Left;  . INCISION AND DRAINAGE ABSCESS Left 06/05/2016   Procedure: INCISION AND DRAINAGE ABSCESS;  Surgeon: Aviva Signs, MD;  Location: [facility] ORS;  Service: General;  Laterality: Left;  . INCISION AND DRAINAGE PERIRECTAL ABSCESS Left 06/07/2016   Procedure: IRRIGATION AND DEBRIDEMENT LEFT BUTTOCK ABSCESS;  Surgeon: Greer Pickerel, MD;  Location: [place];  Service: General;  Laterality: Left;  . INGUINAL HERNIA REPAIR Bilateral 1990's  . LAPAROSCOPIC PARTIAL COLECTOMY N/A 06/11/2016   Procedure: LAPAROSCOPIC  OPEN COLOSTOMY;  Surgeon: [name], MD;  Location: [place];  Service: General;  Laterality: N/A;  . LEFT HEART CATHETERIZATION WITH CORONARY ANGIOGRAM N/A 04/17/2013   Procedure: LEFT HEART CATHETERIZATION WITH CORONARY ANGIOGRAM;  Surgeon: Peter M Martinique, MD;  Location[facility] LAB;  Service: Cardiovascular;  Laterality: N/A;  . PERCUTANEOUS STENT INTERVENTION  04/17/2013   Procedure:  PERCUTANEOUS STENT INTERVENTION;  Surgeon: Peter M Martinique, MD;  Location: [facility] CATH LAB;  Service: Cardiovascular;;  . PERIPHERAL VASCULAR CATHETERIZATION N/A 06/14/2015   Procedure: Abdominal Aortogram;  Surgeon: Elam Dutch, MD;  Location: [place] CV LAB;  Service: Cardiovascular;  Laterality: N/A;  . PERIPHERAL VASCULAR CATHETERIZATION Bilateral 06/14/2015   Procedure: Lower Extremity Angiography;  Surgeon: Elam Dutch, MD;  Location: [place] CV LAB;  Service:  Cardiovascular;  Laterality: Bilateral;  . VEIN HARVEST Left 07/02/2015   Procedure: VEIN HARVEST - LEFT GREATER SAPPHENOUS VEIN;  Surgeon: Elam Dutch, MD;  Location: [facility] OR;  Service: Vascular;  Laterality: Left;    Family History  Problem Relation Age of Onset  . Cancer Mother   . Hypertension Mother   . Bleeding Disorder Brother     Current Outpatient Prescriptions on File Prior to Visit  Medication Sig Dispense Refill  . amLODipine (NORVASC) 5 MG tablet Take 5 mg by mouth daily.    Marland Kitchen apixaban (ELIQUIS) 5 MG TABS tablet Take 1 tablet (5 mg total) by mouth 2 (two) times daily.    Marland Kitchen aspirin EC 81 MG tablet Take 81 mg by mouth daily.    . isosorbide mononitrate (IMDUR) 30 MG 24 hr tablet TAKE 1/2 TABLET BY MOUTH ONCE DAILY. (Patient taking differently: Take 15 mg by mouth daily. ) 15 tablet 3  . lisinopril (PRINIVIL,ZESTRIL) 40 MG tablet Take 40 mg by mouth daily.    Marland Kitchen lovastatin (MEVACOR) 10 MG tablet Take 1 tablet (10 mg total) by mouth at bedtime. 30 tablet 11  . metoprolol tartrate (LOPRESSOR) 25 MG tablet Take 1 tablet (25 mg total) by mouth 2 (two) times daily.    Marland Kitchen morphine (MS CONTIN) 60 MG 12 hr tablet Take 1 tablet (60 mg total) by mouth every 8 (eight) hours. 90 tablet 0  . NITROSTAT 0.4 MG SL tablet PLACE 1 TABLET UNDER THE TONGUE AS NEEDED FOR CHEST PAIN UP TO 3 DOSES 25 tablet 3  . omeprazole (PRILOSEC) 40 MG capsule Take 40 mg by mouth daily.    . Oxycodone HCl 10 MG TABS Take 1-2 tablets (10-20 mg total) by mouth every 4 (four) hours as needed. 100 tablet 0  . polyethylene glycol-electrolytes (TRILYTE) 420 g solution Take 4,000 mLs by mouth as directed. 4000 mL 0  . vitamin C (VITAMIN C) 500 MG tablet Take 1 tablet (500 mg total) by mouth daily.    Marland Kitchen zinc sulfate 220 (50 Zn) MG capsule Take 1 capsule (220 mg total) by mouth daily.     No current facility-administered medications on file prior to visit.     Allergies  Allergen Reactions  . Darvocet [Propoxyphene  N-Acetaminophen] Palpitations  . Propoxyphene Palpitations    History  Alcohol Use  . 1.8 oz/week  . 3 Cans of beer per week    History  Smoking Status  . Light Tobacco Smoker  . Packs/day: 0.25  . Years: 40.00  . Types: Cigarettes  . Start date: 03/14/1974  Smokeless Tobacco  . Never Used    Comment: 5-6 per day 06/12/15    Review of Systems  Constitutional: Negative.   HENT: Negative.   Eyes: Negative.   Respiratory: Negative.   Cardiovascular: Negative.   Gastrointestinal: Positive for heartburn.  Genitourinary: Negative.   Musculoskeletal: Negative.   Skin: Negative.   Neurological: Negative.   Endo/Heme/Allergies: Negative.   Psychiatric/Behavioral: Negative.     Objective   Vitals:   07/06/17 1341  BP: (!) 141/80  Pulse: 88  Resp: 18  Temp: 98.2 F (36.8 C)    Physical Exam  Constitutional: He is oriented to person, place, and time and well-developed, well-nourished, and in no distress.  HENT:  Head: Normocephalic.  Cardiovascular: Normal rate, regular rhythm and normal heart sounds.  Exam reveals no gallop and no friction rub.   No murmur heard. Pulmonary/Chest: Effort normal and breath sounds normal. No respiratory distress. He has no wheezes. He has no rales.  Abdominal: Soft. He exhibits no distension. There is no tenderness. There is no rebound.  Colostomy in place.  Genitourinary:  Genitourinary Comments: Intended scar with serous drainage present in the left buttock, perirectal region.  Neurological: He is alert and oriented to person, place, and time.  Skin: Skin is warm and dry.  Vitals reviewed.   Assessment  Perianal fistula, draining sinus from previous drainage of pelvic abscess, rectal cancer Plan   I told the patient that he should follow back up with [name] for further evaluation treatment as he is has been following this patient for his rectal cancer. Patient understands and agrees. He will call his office. Follow up here as  needed.

## 2017-07-20 ENCOUNTER — Ambulatory Visit: Payer: Medicare HMO | Admitting: General Surgery

## 2017-07-23 ENCOUNTER — Telehealth (HOSPITAL_COMMUNITY): Payer: Self-pay | Admitting: *Deleted

## 2017-07-23 ENCOUNTER — Other Ambulatory Visit (HOSPITAL_COMMUNITY): Payer: Self-pay | Admitting: Adult Health

## 2017-07-23 ENCOUNTER — Encounter (HOSPITAL_COMMUNITY): Payer: Self-pay | Admitting: Adult Health

## 2017-07-23 DIAGNOSIS — G894 Chronic pain syndrome: Secondary | ICD-10-CM

## 2017-07-23 MED ORDER — MORPHINE SULFATE ER 60 MG PO TBCR: 60.0000 mg | EXTENDED_RELEASE_TABLET | Freq: Three times a day (TID) | ORAL | 0 refills | Status: DC

## 2017-07-23 MED ORDER — OXYCODONE HCL 10 MG PO TABS: 10.0000 mg | ORAL_TABLET | ORAL | 0 refills | Status: DC | PRN

## 2017-07-23 NOTE — Progress Notes (Signed)
Patient called cancer center requesting refill of Oxycodone and MS Contin.   [place] Controlled Substance Reporting System reviewed and refill is appropriate on or after 07/26/17 for Oxycodone and 07/29/17 for MS Contin. Paper prescription printed & post-dated; Rx left at cancer center front desk for patient to retrieve after showing photo ID per clinic policy.   NCCSRS reviewed:     Mike Craze, NP [place] [phone]

## 2017-07-23 NOTE — Telephone Encounter (Signed)
Rx printed.   gwd

## 2017-08-06 ENCOUNTER — Other Ambulatory Visit (HOSPITAL_COMMUNITY): Payer: Self-pay | Admitting: Internal Medicine

## 2017-08-06 ENCOUNTER — Ambulatory Visit (HOSPITAL_COMMUNITY)
Admission: RE | Admit: 2017-08-06 | Discharge: 2017-08-06 | Disposition: A | Discharge status: Home / Self Care | Payer: Medicare HMO | Source: Ambulatory Visit | Attending: Internal Medicine | Admitting: Internal Medicine

## 2017-08-06 DIAGNOSIS — M5030 Other cervical disc degeneration, unspecified cervical region: Secondary | ICD-10-CM | POA: Insufficient documentation

## 2017-08-06 DIAGNOSIS — C20 Malignant neoplasm of rectum: Secondary | ICD-10-CM | POA: Diagnosis not present

## 2017-08-06 DIAGNOSIS — M542 Cervicalgia: Secondary | ICD-10-CM | POA: Diagnosis not present

## 2017-08-24 ENCOUNTER — Telehealth (HOSPITAL_COMMUNITY): Payer: Self-pay | Admitting: *Deleted

## 2017-08-25 ENCOUNTER — Encounter (HOSPITAL_COMMUNITY): Payer: Self-pay | Admitting: Adult Health

## 2017-08-25 ENCOUNTER — Other Ambulatory Visit (HOSPITAL_COMMUNITY): Payer: Self-pay | Admitting: Adult Health

## 2017-08-25 DIAGNOSIS — G894 Chronic pain syndrome: Secondary | ICD-10-CM

## 2017-08-25 MED ORDER — OXYCODONE HCL 10 MG PO TABS: 10.0000 mg | ORAL_TABLET | ORAL | 0 refills | Status: DC | PRN

## 2017-08-25 MED ORDER — MORPHINE SULFATE ER 60 MG PO TBCR: 60.0000 mg | EXTENDED_RELEASE_TABLET | Freq: Three times a day (TID) | ORAL | 0 refills | Status: DC

## 2017-08-25 NOTE — Telephone Encounter (Signed)
Rxs printed  gwd

## 2017-08-25 NOTE — Progress Notes (Signed)
Patient called cancer center requesting refill of MS Contin & oxycodone.   [place] Controlled Substance Reporting System reviewed and refill is appropriate for MS Contin on or after 08/28/17; for oxycodone on or after 08/26/17. Paper prescription printed & post-dated; Rx left at cancer center front desk for patient to retrieve after showing photo ID per clinic policy.   NCCSRS reviewed:     Mike Craze, NP [place] [phone]

## 2017-08-25 NOTE — Progress Notes (Signed)
Refill opiates

## 2017-09-08 DIAGNOSIS — I2510 Atherosclerotic heart disease of native coronary artery without angina pectoris: Secondary | ICD-10-CM | POA: Diagnosis not present

## 2017-09-08 DIAGNOSIS — Z79899 Other long term (current) drug therapy: Secondary | ICD-10-CM | POA: Diagnosis not present

## 2017-09-08 DIAGNOSIS — L598 Other specified disorders of the skin and subcutaneous tissue related to radiation: Secondary | ICD-10-CM | POA: Diagnosis not present

## 2017-09-08 DIAGNOSIS — Z85048 Personal history of other malignant neoplasm of rectum, rectosigmoid junction, and anus: Secondary | ICD-10-CM | POA: Diagnosis not present

## 2017-09-08 DIAGNOSIS — Z7901 Long term (current) use of anticoagulants: Secondary | ICD-10-CM | POA: Diagnosis not present

## 2017-09-08 DIAGNOSIS — Z886 Allergy status to analgesic agent status: Secondary | ICD-10-CM | POA: Diagnosis not present

## 2017-09-08 DIAGNOSIS — F17200 Nicotine dependence, unspecified, uncomplicated: Secondary | ICD-10-CM | POA: Diagnosis not present

## 2017-09-08 DIAGNOSIS — I10 Essential (primary) hypertension: Secondary | ICD-10-CM | POA: Diagnosis not present

## 2017-09-08 DIAGNOSIS — Z933 Colostomy status: Secondary | ICD-10-CM | POA: Diagnosis not present

## 2017-09-08 DIAGNOSIS — Z7982 Long term (current) use of aspirin: Secondary | ICD-10-CM | POA: Diagnosis not present

## 2017-09-08 DIAGNOSIS — Z86718 Personal history of other venous thrombosis and embolism: Secondary | ICD-10-CM | POA: Diagnosis not present

## 2017-09-08 DIAGNOSIS — E785 Hyperlipidemia, unspecified: Secondary | ICD-10-CM | POA: Diagnosis not present

## 2017-09-10 DIAGNOSIS — K605 Anorectal fistula: Secondary | ICD-10-CM | POA: Diagnosis not present

## 2017-09-10 DIAGNOSIS — I10 Essential (primary) hypertension: Secondary | ICD-10-CM | POA: Diagnosis not present

## 2017-09-10 DIAGNOSIS — I739 Peripheral vascular disease, unspecified: Secondary | ICD-10-CM | POA: Diagnosis not present

## 2017-09-10 DIAGNOSIS — I2510 Atherosclerotic heart disease of native coronary artery without angina pectoris: Secondary | ICD-10-CM | POA: Diagnosis not present

## 2017-09-10 DIAGNOSIS — Y842 Radiological procedure and radiotherapy as the cause of abnormal reaction of the patient, or of later complication, without mention of misadventure at the time of the procedure: Secondary | ICD-10-CM | POA: Diagnosis not present

## 2017-09-10 DIAGNOSIS — Z933 Colostomy status: Secondary | ICD-10-CM | POA: Diagnosis not present

## 2017-09-14 DIAGNOSIS — I2510 Atherosclerotic heart disease of native coronary artery without angina pectoris: Secondary | ICD-10-CM | POA: Diagnosis not present

## 2017-09-14 DIAGNOSIS — K605 Anorectal fistula: Secondary | ICD-10-CM | POA: Diagnosis not present

## 2017-09-14 DIAGNOSIS — Z933 Colostomy status: Secondary | ICD-10-CM | POA: Diagnosis not present

## 2017-09-14 DIAGNOSIS — I739 Peripheral vascular disease, unspecified: Secondary | ICD-10-CM | POA: Diagnosis not present

## 2017-09-14 DIAGNOSIS — Y842 Radiological procedure and radiotherapy as the cause of abnormal reaction of the patient, or of later complication, without mention of misadventure at the time of the procedure: Secondary | ICD-10-CM | POA: Diagnosis not present

## 2017-09-14 DIAGNOSIS — I10 Essential (primary) hypertension: Secondary | ICD-10-CM | POA: Diagnosis not present

## 2017-09-15 DIAGNOSIS — I2510 Atherosclerotic heart disease of native coronary artery without angina pectoris: Secondary | ICD-10-CM | POA: Diagnosis not present

## 2017-09-15 DIAGNOSIS — C20 Malignant neoplasm of rectum: Secondary | ICD-10-CM | POA: Diagnosis not present

## 2017-09-15 DIAGNOSIS — Z1389 Encounter for screening for other disorder: Secondary | ICD-10-CM | POA: Diagnosis not present

## 2017-09-15 DIAGNOSIS — I10 Essential (primary) hypertension: Secondary | ICD-10-CM | POA: Diagnosis not present

## 2017-09-15 DIAGNOSIS — Z933 Colostomy status: Secondary | ICD-10-CM | POA: Diagnosis not present

## 2017-09-17 DIAGNOSIS — Z933 Colostomy status: Secondary | ICD-10-CM | POA: Diagnosis not present

## 2017-09-17 DIAGNOSIS — K605 Anorectal fistula: Secondary | ICD-10-CM | POA: Diagnosis not present

## 2017-09-17 DIAGNOSIS — I739 Peripheral vascular disease, unspecified: Secondary | ICD-10-CM | POA: Diagnosis not present

## 2017-09-17 DIAGNOSIS — I10 Essential (primary) hypertension: Secondary | ICD-10-CM | POA: Diagnosis not present

## 2017-09-17 DIAGNOSIS — I2510 Atherosclerotic heart disease of native coronary artery without angina pectoris: Secondary | ICD-10-CM | POA: Diagnosis not present

## 2017-09-17 DIAGNOSIS — Y842 Radiological procedure and radiotherapy as the cause of abnormal reaction of the patient, or of later complication, without mention of misadventure at the time of the procedure: Secondary | ICD-10-CM | POA: Diagnosis not present

## 2017-09-20 DIAGNOSIS — M879 Osteonecrosis, unspecified: Secondary | ICD-10-CM | POA: Diagnosis not present

## 2017-09-20 DIAGNOSIS — K604 Rectal fistula: Secondary | ICD-10-CM | POA: Diagnosis not present

## 2017-09-20 DIAGNOSIS — K603 Anal fistula: Secondary | ICD-10-CM | POA: Diagnosis not present

## 2017-09-20 DIAGNOSIS — S31829A Unspecified open wound of left buttock, initial encounter: Secondary | ICD-10-CM | POA: Diagnosis not present

## 2017-09-22 ENCOUNTER — Encounter (HOSPITAL_COMMUNITY): Payer: Self-pay | Admitting: Adult Health

## 2017-09-22 ENCOUNTER — Other Ambulatory Visit (HOSPITAL_COMMUNITY): Payer: Self-pay | Admitting: Adult Health

## 2017-09-22 DIAGNOSIS — Z7901 Long term (current) use of anticoagulants: Secondary | ICD-10-CM | POA: Diagnosis not present

## 2017-09-22 DIAGNOSIS — Z79899 Other long term (current) drug therapy: Secondary | ICD-10-CM | POA: Diagnosis not present

## 2017-09-22 DIAGNOSIS — Z7982 Long term (current) use of aspirin: Secondary | ICD-10-CM | POA: Diagnosis not present

## 2017-09-22 DIAGNOSIS — Z933 Colostomy status: Secondary | ICD-10-CM | POA: Diagnosis not present

## 2017-09-22 DIAGNOSIS — Z85048 Personal history of other malignant neoplasm of rectum, rectosigmoid junction, and anus: Secondary | ICD-10-CM | POA: Diagnosis not present

## 2017-09-22 DIAGNOSIS — Z86718 Personal history of other venous thrombosis and embolism: Secondary | ICD-10-CM | POA: Diagnosis not present

## 2017-09-22 DIAGNOSIS — F17200 Nicotine dependence, unspecified, uncomplicated: Secondary | ICD-10-CM | POA: Diagnosis not present

## 2017-09-22 DIAGNOSIS — E785 Hyperlipidemia, unspecified: Secondary | ICD-10-CM | POA: Diagnosis not present

## 2017-09-22 DIAGNOSIS — I10 Essential (primary) hypertension: Secondary | ICD-10-CM | POA: Diagnosis not present

## 2017-09-22 DIAGNOSIS — I2510 Atherosclerotic heart disease of native coronary artery without angina pectoris: Secondary | ICD-10-CM | POA: Diagnosis not present

## 2017-09-22 DIAGNOSIS — G894 Chronic pain syndrome: Secondary | ICD-10-CM

## 2017-09-22 DIAGNOSIS — Z886 Allergy status to analgesic agent status: Secondary | ICD-10-CM | POA: Diagnosis not present

## 2017-09-22 DIAGNOSIS — L598 Other specified disorders of the skin and subcutaneous tissue related to radiation: Secondary | ICD-10-CM | POA: Diagnosis not present

## 2017-09-22 MED ORDER — MORPHINE SULFATE ER 60 MG PO TBCR: 60.0000 mg | EXTENDED_RELEASE_TABLET | Freq: Three times a day (TID) | ORAL | 0 refills | Status: DC

## 2017-09-22 MED ORDER — OXYCODONE HCL 10 MG PO TABS: 10.0000 mg | ORAL_TABLET | ORAL | 0 refills | Status: DC | PRN

## 2017-09-22 NOTE — Progress Notes (Signed)
Patient called cancer center requesting refill of Oxycodone and MS Contin.   [place] Controlled Substance Reporting System reviewed and refill is appropriate for both prescriptions on or after 09/25/17. Paper prescription printed & post-dated; Rx left at cancer center front desk for patient to retrieve after showing photo ID per clinic policy.   NCCSRS reviewed:     Mike Craze, NP [place] [phone]

## 2017-09-24 DIAGNOSIS — I10 Essential (primary) hypertension: Secondary | ICD-10-CM | POA: Diagnosis not present

## 2017-09-24 DIAGNOSIS — I2510 Atherosclerotic heart disease of native coronary artery without angina pectoris: Secondary | ICD-10-CM | POA: Diagnosis not present

## 2017-09-24 DIAGNOSIS — Y842 Radiological procedure and radiotherapy as the cause of abnormal reaction of the patient, or of later complication, without mention of misadventure at the time of the procedure: Secondary | ICD-10-CM | POA: Diagnosis not present

## 2017-09-24 DIAGNOSIS — K605 Anorectal fistula: Secondary | ICD-10-CM | POA: Diagnosis not present

## 2017-09-24 DIAGNOSIS — I739 Peripheral vascular disease, unspecified: Secondary | ICD-10-CM | POA: Diagnosis not present

## 2017-09-24 DIAGNOSIS — Z933 Colostomy status: Secondary | ICD-10-CM | POA: Diagnosis not present

## 2017-09-29 DIAGNOSIS — I2510 Atherosclerotic heart disease of native coronary artery without angina pectoris: Secondary | ICD-10-CM | POA: Diagnosis not present

## 2017-09-29 DIAGNOSIS — Z7901 Long term (current) use of anticoagulants: Secondary | ICD-10-CM | POA: Diagnosis not present

## 2017-09-29 DIAGNOSIS — K605 Anorectal fistula: Secondary | ICD-10-CM | POA: Diagnosis not present

## 2017-09-29 DIAGNOSIS — I739 Peripheral vascular disease, unspecified: Secondary | ICD-10-CM | POA: Diagnosis not present

## 2017-09-29 DIAGNOSIS — Z85048 Personal history of other malignant neoplasm of rectum, rectosigmoid junction, and anus: Secondary | ICD-10-CM | POA: Diagnosis not present

## 2017-09-29 DIAGNOSIS — Z7982 Long term (current) use of aspirin: Secondary | ICD-10-CM | POA: Diagnosis not present

## 2017-09-29 DIAGNOSIS — Z933 Colostomy status: Secondary | ICD-10-CM | POA: Diagnosis not present

## 2017-09-29 DIAGNOSIS — I10 Essential (primary) hypertension: Secondary | ICD-10-CM | POA: Diagnosis not present

## 2017-09-29 DIAGNOSIS — Y842 Radiological procedure and radiotherapy as the cause of abnormal reaction of the patient, or of later complication, without mention of misadventure at the time of the procedure: Secondary | ICD-10-CM | POA: Diagnosis not present

## 2017-10-01 DIAGNOSIS — Z933 Colostomy status: Secondary | ICD-10-CM | POA: Diagnosis not present

## 2017-10-01 DIAGNOSIS — I2510 Atherosclerotic heart disease of native coronary artery without angina pectoris: Secondary | ICD-10-CM | POA: Diagnosis not present

## 2017-10-01 DIAGNOSIS — K605 Anorectal fistula: Secondary | ICD-10-CM | POA: Diagnosis not present

## 2017-10-01 DIAGNOSIS — Z7901 Long term (current) use of anticoagulants: Secondary | ICD-10-CM | POA: Diagnosis not present

## 2017-10-01 DIAGNOSIS — Y842 Radiological procedure and radiotherapy as the cause of abnormal reaction of the patient, or of later complication, without mention of misadventure at the time of the procedure: Secondary | ICD-10-CM | POA: Diagnosis not present

## 2017-10-01 DIAGNOSIS — Z85048 Personal history of other malignant neoplasm of rectum, rectosigmoid junction, and anus: Secondary | ICD-10-CM | POA: Diagnosis not present

## 2017-10-01 DIAGNOSIS — Z7982 Long term (current) use of aspirin: Secondary | ICD-10-CM | POA: Diagnosis not present

## 2017-10-01 DIAGNOSIS — I10 Essential (primary) hypertension: Secondary | ICD-10-CM | POA: Diagnosis not present

## 2017-10-01 DIAGNOSIS — I739 Peripheral vascular disease, unspecified: Secondary | ICD-10-CM | POA: Diagnosis not present

## 2017-10-05 DIAGNOSIS — I10 Essential (primary) hypertension: Secondary | ICD-10-CM | POA: Diagnosis not present

## 2017-10-05 DIAGNOSIS — I739 Peripheral vascular disease, unspecified: Secondary | ICD-10-CM | POA: Diagnosis not present

## 2017-10-05 DIAGNOSIS — Z7982 Long term (current) use of aspirin: Secondary | ICD-10-CM | POA: Diagnosis not present

## 2017-10-05 DIAGNOSIS — Z933 Colostomy status: Secondary | ICD-10-CM | POA: Diagnosis not present

## 2017-10-05 DIAGNOSIS — Z7901 Long term (current) use of anticoagulants: Secondary | ICD-10-CM | POA: Diagnosis not present

## 2017-10-05 DIAGNOSIS — K605 Anorectal fistula: Secondary | ICD-10-CM | POA: Diagnosis not present

## 2017-10-05 DIAGNOSIS — Y842 Radiological procedure and radiotherapy as the cause of abnormal reaction of the patient, or of later complication, without mention of misadventure at the time of the procedure: Secondary | ICD-10-CM | POA: Diagnosis not present

## 2017-10-05 DIAGNOSIS — I2510 Atherosclerotic heart disease of native coronary artery without angina pectoris: Secondary | ICD-10-CM | POA: Diagnosis not present

## 2017-10-05 DIAGNOSIS — Z85048 Personal history of other malignant neoplasm of rectum, rectosigmoid junction, and anus: Secondary | ICD-10-CM | POA: Diagnosis not present

## 2017-10-06 DIAGNOSIS — Z933 Colostomy status: Secondary | ICD-10-CM | POA: Diagnosis not present

## 2017-10-06 DIAGNOSIS — L89159 Pressure ulcer of sacral region, unspecified stage: Secondary | ICD-10-CM | POA: Diagnosis not present

## 2017-10-08 DIAGNOSIS — K605 Anorectal fistula: Secondary | ICD-10-CM | POA: Diagnosis not present

## 2017-10-08 DIAGNOSIS — I2510 Atherosclerotic heart disease of native coronary artery without angina pectoris: Secondary | ICD-10-CM | POA: Diagnosis not present

## 2017-10-08 DIAGNOSIS — Z7901 Long term (current) use of anticoagulants: Secondary | ICD-10-CM | POA: Diagnosis not present

## 2017-10-08 DIAGNOSIS — Z933 Colostomy status: Secondary | ICD-10-CM | POA: Diagnosis not present

## 2017-10-08 DIAGNOSIS — Z85048 Personal history of other malignant neoplasm of rectum, rectosigmoid junction, and anus: Secondary | ICD-10-CM | POA: Diagnosis not present

## 2017-10-08 DIAGNOSIS — Y842 Radiological procedure and radiotherapy as the cause of abnormal reaction of the patient, or of later complication, without mention of misadventure at the time of the procedure: Secondary | ICD-10-CM | POA: Diagnosis not present

## 2017-10-08 DIAGNOSIS — I10 Essential (primary) hypertension: Secondary | ICD-10-CM | POA: Diagnosis not present

## 2017-10-08 DIAGNOSIS — Z7982 Long term (current) use of aspirin: Secondary | ICD-10-CM | POA: Diagnosis not present

## 2017-10-08 DIAGNOSIS — I739 Peripheral vascular disease, unspecified: Secondary | ICD-10-CM | POA: Diagnosis not present

## 2017-10-12 DIAGNOSIS — Z7901 Long term (current) use of anticoagulants: Secondary | ICD-10-CM | POA: Diagnosis not present

## 2017-10-12 DIAGNOSIS — K605 Anorectal fistula: Secondary | ICD-10-CM | POA: Diagnosis not present

## 2017-10-12 DIAGNOSIS — Z933 Colostomy status: Secondary | ICD-10-CM | POA: Diagnosis not present

## 2017-10-12 DIAGNOSIS — I10 Essential (primary) hypertension: Secondary | ICD-10-CM | POA: Diagnosis not present

## 2017-10-12 DIAGNOSIS — Z7982 Long term (current) use of aspirin: Secondary | ICD-10-CM | POA: Diagnosis not present

## 2017-10-12 DIAGNOSIS — Z85048 Personal history of other malignant neoplasm of rectum, rectosigmoid junction, and anus: Secondary | ICD-10-CM | POA: Diagnosis not present

## 2017-10-12 DIAGNOSIS — I739 Peripheral vascular disease, unspecified: Secondary | ICD-10-CM | POA: Diagnosis not present

## 2017-10-12 DIAGNOSIS — I2510 Atherosclerotic heart disease of native coronary artery without angina pectoris: Secondary | ICD-10-CM | POA: Diagnosis not present

## 2017-10-12 DIAGNOSIS — Y842 Radiological procedure and radiotherapy as the cause of abnormal reaction of the patient, or of later complication, without mention of misadventure at the time of the procedure: Secondary | ICD-10-CM | POA: Diagnosis not present

## 2017-10-15 DIAGNOSIS — Z933 Colostomy status: Secondary | ICD-10-CM | POA: Diagnosis not present

## 2017-10-15 DIAGNOSIS — I739 Peripheral vascular disease, unspecified: Secondary | ICD-10-CM | POA: Diagnosis not present

## 2017-10-15 DIAGNOSIS — Z85048 Personal history of other malignant neoplasm of rectum, rectosigmoid junction, and anus: Secondary | ICD-10-CM | POA: Diagnosis not present

## 2017-10-15 DIAGNOSIS — I10 Essential (primary) hypertension: Secondary | ICD-10-CM | POA: Diagnosis not present

## 2017-10-15 DIAGNOSIS — K605 Anorectal fistula: Secondary | ICD-10-CM | POA: Diagnosis not present

## 2017-10-15 DIAGNOSIS — I2510 Atherosclerotic heart disease of native coronary artery without angina pectoris: Secondary | ICD-10-CM | POA: Diagnosis not present

## 2017-10-15 DIAGNOSIS — Z7901 Long term (current) use of anticoagulants: Secondary | ICD-10-CM | POA: Diagnosis not present

## 2017-10-15 DIAGNOSIS — Y842 Radiological procedure and radiotherapy as the cause of abnormal reaction of the patient, or of later complication, without mention of misadventure at the time of the procedure: Secondary | ICD-10-CM | POA: Diagnosis not present

## 2017-10-15 DIAGNOSIS — Z7982 Long term (current) use of aspirin: Secondary | ICD-10-CM | POA: Diagnosis not present

## 2017-10-16 DIAGNOSIS — K605 Anorectal fistula: Secondary | ICD-10-CM | POA: Diagnosis not present

## 2017-10-19 DIAGNOSIS — Z7982 Long term (current) use of aspirin: Secondary | ICD-10-CM | POA: Diagnosis not present

## 2017-10-19 DIAGNOSIS — Z7901 Long term (current) use of anticoagulants: Secondary | ICD-10-CM | POA: Diagnosis not present

## 2017-10-19 DIAGNOSIS — Z85048 Personal history of other malignant neoplasm of rectum, rectosigmoid junction, and anus: Secondary | ICD-10-CM | POA: Diagnosis not present

## 2017-10-19 DIAGNOSIS — Z933 Colostomy status: Secondary | ICD-10-CM | POA: Diagnosis not present

## 2017-10-19 DIAGNOSIS — I739 Peripheral vascular disease, unspecified: Secondary | ICD-10-CM | POA: Diagnosis not present

## 2017-10-19 DIAGNOSIS — I10 Essential (primary) hypertension: Secondary | ICD-10-CM | POA: Diagnosis not present

## 2017-10-19 DIAGNOSIS — I2510 Atherosclerotic heart disease of native coronary artery without angina pectoris: Secondary | ICD-10-CM | POA: Diagnosis not present

## 2017-10-19 DIAGNOSIS — Y842 Radiological procedure and radiotherapy as the cause of abnormal reaction of the patient, or of later complication, without mention of misadventure at the time of the procedure: Secondary | ICD-10-CM | POA: Diagnosis not present

## 2017-10-19 DIAGNOSIS — K605 Anorectal fistula: Secondary | ICD-10-CM | POA: Diagnosis not present

## 2017-10-21 DIAGNOSIS — L598 Other specified disorders of the skin and subcutaneous tissue related to radiation: Secondary | ICD-10-CM | POA: Diagnosis not present

## 2017-10-21 DIAGNOSIS — M8788 Other osteonecrosis, other site: Secondary | ICD-10-CM | POA: Diagnosis not present

## 2017-10-25 ENCOUNTER — Encounter (HOSPITAL_COMMUNITY): Payer: Self-pay | Admitting: Adult Health

## 2017-10-25 ENCOUNTER — Other Ambulatory Visit (HOSPITAL_COMMUNITY): Payer: Self-pay | Admitting: Adult Health

## 2017-10-25 DIAGNOSIS — G894 Chronic pain syndrome: Secondary | ICD-10-CM

## 2017-10-25 MED ORDER — MORPHINE SULFATE ER 60 MG PO TBCR: 60.0000 mg | EXTENDED_RELEASE_TABLET | Freq: Three times a day (TID) | ORAL | 0 refills | Status: DC

## 2017-10-25 MED ORDER — OXYCODONE HCL 10 MG PO TABS: 10.0000 mg | ORAL_TABLET | ORAL | 0 refills | Status: DC | PRN

## 2017-10-25 NOTE — Progress Notes (Signed)
Patient called cancer center requesting refill of MS Contin & Oxycodone.   [place] Controlled Substance Reporting System reviewed and refill is appropriate on or after 10/26/17. Medication e-scribed to his pharmacy [facility]) using [employer]'s 2-step verification process.    NCCSRS reviewed:     Mike Craze, NP [place] [phone]

## 2017-10-28 DIAGNOSIS — Z933 Colostomy status: Secondary | ICD-10-CM | POA: Diagnosis not present

## 2017-10-28 DIAGNOSIS — I10 Essential (primary) hypertension: Secondary | ICD-10-CM | POA: Diagnosis not present

## 2017-10-28 DIAGNOSIS — I739 Peripheral vascular disease, unspecified: Secondary | ICD-10-CM | POA: Diagnosis not present

## 2017-10-28 DIAGNOSIS — I2510 Atherosclerotic heart disease of native coronary artery without angina pectoris: Secondary | ICD-10-CM | POA: Diagnosis not present

## 2017-10-28 DIAGNOSIS — Y842 Radiological procedure and radiotherapy as the cause of abnormal reaction of the patient, or of later complication, without mention of misadventure at the time of the procedure: Secondary | ICD-10-CM | POA: Diagnosis not present

## 2017-10-28 DIAGNOSIS — Z86718 Personal history of other venous thrombosis and embolism: Secondary | ICD-10-CM | POA: Diagnosis not present

## 2017-10-28 DIAGNOSIS — Z7901 Long term (current) use of anticoagulants: Secondary | ICD-10-CM | POA: Diagnosis not present

## 2017-10-28 DIAGNOSIS — Z85048 Personal history of other malignant neoplasm of rectum, rectosigmoid junction, and anus: Secondary | ICD-10-CM | POA: Diagnosis not present

## 2017-10-28 DIAGNOSIS — K605 Anorectal fistula: Secondary | ICD-10-CM | POA: Diagnosis not present

## 2017-11-01 DIAGNOSIS — Z85048 Personal history of other malignant neoplasm of rectum, rectosigmoid junction, and anus: Secondary | ICD-10-CM | POA: Diagnosis not present

## 2017-11-01 DIAGNOSIS — Y842 Radiological procedure and radiotherapy as the cause of abnormal reaction of the patient, or of later complication, without mention of misadventure at the time of the procedure: Secondary | ICD-10-CM | POA: Diagnosis not present

## 2017-11-01 DIAGNOSIS — I2510 Atherosclerotic heart disease of native coronary artery without angina pectoris: Secondary | ICD-10-CM | POA: Diagnosis not present

## 2017-11-01 DIAGNOSIS — I739 Peripheral vascular disease, unspecified: Secondary | ICD-10-CM | POA: Diagnosis not present

## 2017-11-01 DIAGNOSIS — K605 Anorectal fistula: Secondary | ICD-10-CM | POA: Diagnosis not present

## 2017-11-01 DIAGNOSIS — I10 Essential (primary) hypertension: Secondary | ICD-10-CM | POA: Diagnosis not present

## 2017-11-01 DIAGNOSIS — Z7901 Long term (current) use of anticoagulants: Secondary | ICD-10-CM | POA: Diagnosis not present

## 2017-11-01 DIAGNOSIS — Z933 Colostomy status: Secondary | ICD-10-CM | POA: Diagnosis not present

## 2017-11-01 DIAGNOSIS — Z86718 Personal history of other venous thrombosis and embolism: Secondary | ICD-10-CM | POA: Diagnosis not present

## 2017-11-05 DIAGNOSIS — Z7901 Long term (current) use of anticoagulants: Secondary | ICD-10-CM | POA: Diagnosis not present

## 2017-11-05 DIAGNOSIS — K605 Anorectal fistula: Secondary | ICD-10-CM | POA: Diagnosis not present

## 2017-11-05 DIAGNOSIS — I739 Peripheral vascular disease, unspecified: Secondary | ICD-10-CM | POA: Diagnosis not present

## 2017-11-05 DIAGNOSIS — I2510 Atherosclerotic heart disease of native coronary artery without angina pectoris: Secondary | ICD-10-CM | POA: Diagnosis not present

## 2017-11-05 DIAGNOSIS — I10 Essential (primary) hypertension: Secondary | ICD-10-CM | POA: Diagnosis not present

## 2017-11-05 DIAGNOSIS — Z933 Colostomy status: Secondary | ICD-10-CM | POA: Diagnosis not present

## 2017-11-05 DIAGNOSIS — Z86718 Personal history of other venous thrombosis and embolism: Secondary | ICD-10-CM | POA: Diagnosis not present

## 2017-11-05 DIAGNOSIS — Y842 Radiological procedure and radiotherapy as the cause of abnormal reaction of the patient, or of later complication, without mention of misadventure at the time of the procedure: Secondary | ICD-10-CM | POA: Diagnosis not present

## 2017-11-05 DIAGNOSIS — Z85048 Personal history of other malignant neoplasm of rectum, rectosigmoid junction, and anus: Secondary | ICD-10-CM | POA: Diagnosis not present

## 2017-11-08 DIAGNOSIS — Z933 Colostomy status: Secondary | ICD-10-CM | POA: Diagnosis not present

## 2017-11-08 DIAGNOSIS — Y842 Radiological procedure and radiotherapy as the cause of abnormal reaction of the patient, or of later complication, without mention of misadventure at the time of the procedure: Secondary | ICD-10-CM | POA: Diagnosis not present

## 2017-11-08 DIAGNOSIS — K605 Anorectal fistula: Secondary | ICD-10-CM | POA: Diagnosis not present

## 2017-11-08 DIAGNOSIS — I739 Peripheral vascular disease, unspecified: Secondary | ICD-10-CM | POA: Diagnosis not present

## 2017-11-08 DIAGNOSIS — Z7901 Long term (current) use of anticoagulants: Secondary | ICD-10-CM | POA: Diagnosis not present

## 2017-11-08 DIAGNOSIS — Z85048 Personal history of other malignant neoplasm of rectum, rectosigmoid junction, and anus: Secondary | ICD-10-CM | POA: Diagnosis not present

## 2017-11-08 DIAGNOSIS — I10 Essential (primary) hypertension: Secondary | ICD-10-CM | POA: Diagnosis not present

## 2017-11-08 DIAGNOSIS — I2510 Atherosclerotic heart disease of native coronary artery without angina pectoris: Secondary | ICD-10-CM | POA: Diagnosis not present

## 2017-11-08 DIAGNOSIS — Z86718 Personal history of other venous thrombosis and embolism: Secondary | ICD-10-CM | POA: Diagnosis not present

## 2017-11-09 DIAGNOSIS — R6889 Other general symptoms and signs: Secondary | ICD-10-CM | POA: Diagnosis not present

## 2017-11-11 DIAGNOSIS — Z933 Colostomy status: Secondary | ICD-10-CM | POA: Diagnosis not present

## 2017-11-11 DIAGNOSIS — L89159 Pressure ulcer of sacral region, unspecified stage: Secondary | ICD-10-CM | POA: Diagnosis not present

## 2017-11-12 DIAGNOSIS — Z7901 Long term (current) use of anticoagulants: Secondary | ICD-10-CM | POA: Diagnosis not present

## 2017-11-12 DIAGNOSIS — Z933 Colostomy status: Secondary | ICD-10-CM | POA: Diagnosis not present

## 2017-11-12 DIAGNOSIS — I739 Peripheral vascular disease, unspecified: Secondary | ICD-10-CM | POA: Diagnosis not present

## 2017-11-12 DIAGNOSIS — I10 Essential (primary) hypertension: Secondary | ICD-10-CM | POA: Diagnosis not present

## 2017-11-12 DIAGNOSIS — I2510 Atherosclerotic heart disease of native coronary artery without angina pectoris: Secondary | ICD-10-CM | POA: Diagnosis not present

## 2017-11-12 DIAGNOSIS — Y842 Radiological procedure and radiotherapy as the cause of abnormal reaction of the patient, or of later complication, without mention of misadventure at the time of the procedure: Secondary | ICD-10-CM | POA: Diagnosis not present

## 2017-11-12 DIAGNOSIS — K605 Anorectal fistula: Secondary | ICD-10-CM | POA: Diagnosis not present

## 2017-11-12 DIAGNOSIS — Z86718 Personal history of other venous thrombosis and embolism: Secondary | ICD-10-CM | POA: Diagnosis not present

## 2017-11-12 DIAGNOSIS — Z85048 Personal history of other malignant neoplasm of rectum, rectosigmoid junction, and anus: Secondary | ICD-10-CM | POA: Diagnosis not present

## 2017-11-16 DIAGNOSIS — Z86718 Personal history of other venous thrombosis and embolism: Secondary | ICD-10-CM | POA: Diagnosis not present

## 2017-11-16 DIAGNOSIS — Z7901 Long term (current) use of anticoagulants: Secondary | ICD-10-CM | POA: Diagnosis not present

## 2017-11-16 DIAGNOSIS — I739 Peripheral vascular disease, unspecified: Secondary | ICD-10-CM | POA: Diagnosis not present

## 2017-11-16 DIAGNOSIS — Z85048 Personal history of other malignant neoplasm of rectum, rectosigmoid junction, and anus: Secondary | ICD-10-CM | POA: Diagnosis not present

## 2017-11-16 DIAGNOSIS — I10 Essential (primary) hypertension: Secondary | ICD-10-CM | POA: Diagnosis not present

## 2017-11-16 DIAGNOSIS — I2510 Atherosclerotic heart disease of native coronary artery without angina pectoris: Secondary | ICD-10-CM | POA: Diagnosis not present

## 2017-11-16 DIAGNOSIS — Z933 Colostomy status: Secondary | ICD-10-CM | POA: Diagnosis not present

## 2017-11-16 DIAGNOSIS — Y842 Radiological procedure and radiotherapy as the cause of abnormal reaction of the patient, or of later complication, without mention of misadventure at the time of the procedure: Secondary | ICD-10-CM | POA: Diagnosis not present

## 2017-11-16 DIAGNOSIS — K605 Anorectal fistula: Secondary | ICD-10-CM | POA: Diagnosis not present

## 2017-11-18 DIAGNOSIS — I2510 Atherosclerotic heart disease of native coronary artery without angina pectoris: Secondary | ICD-10-CM | POA: Diagnosis not present

## 2017-11-18 DIAGNOSIS — Z7901 Long term (current) use of anticoagulants: Secondary | ICD-10-CM | POA: Diagnosis not present

## 2017-11-18 DIAGNOSIS — K605 Anorectal fistula: Secondary | ICD-10-CM | POA: Diagnosis not present

## 2017-11-18 DIAGNOSIS — Z85048 Personal history of other malignant neoplasm of rectum, rectosigmoid junction, and anus: Secondary | ICD-10-CM | POA: Diagnosis not present

## 2017-11-18 DIAGNOSIS — I10 Essential (primary) hypertension: Secondary | ICD-10-CM | POA: Diagnosis not present

## 2017-11-18 DIAGNOSIS — I739 Peripheral vascular disease, unspecified: Secondary | ICD-10-CM | POA: Diagnosis not present

## 2017-11-18 DIAGNOSIS — Z933 Colostomy status: Secondary | ICD-10-CM | POA: Diagnosis not present

## 2017-11-18 DIAGNOSIS — Y842 Radiological procedure and radiotherapy as the cause of abnormal reaction of the patient, or of later complication, without mention of misadventure at the time of the procedure: Secondary | ICD-10-CM | POA: Diagnosis not present

## 2017-11-18 DIAGNOSIS — Z86718 Personal history of other venous thrombosis and embolism: Secondary | ICD-10-CM | POA: Diagnosis not present

## 2017-11-23 DIAGNOSIS — K605 Anorectal fistula: Secondary | ICD-10-CM | POA: Diagnosis not present

## 2017-11-24 DIAGNOSIS — I739 Peripheral vascular disease, unspecified: Secondary | ICD-10-CM | POA: Diagnosis not present

## 2017-11-24 DIAGNOSIS — Z7901 Long term (current) use of anticoagulants: Secondary | ICD-10-CM | POA: Diagnosis not present

## 2017-11-24 DIAGNOSIS — I2510 Atherosclerotic heart disease of native coronary artery without angina pectoris: Secondary | ICD-10-CM | POA: Diagnosis not present

## 2017-11-24 DIAGNOSIS — I10 Essential (primary) hypertension: Secondary | ICD-10-CM | POA: Diagnosis not present

## 2017-11-24 DIAGNOSIS — Y842 Radiological procedure and radiotherapy as the cause of abnormal reaction of the patient, or of later complication, without mention of misadventure at the time of the procedure: Secondary | ICD-10-CM | POA: Diagnosis not present

## 2017-11-24 DIAGNOSIS — K605 Anorectal fistula: Secondary | ICD-10-CM | POA: Diagnosis not present

## 2017-11-24 DIAGNOSIS — Z933 Colostomy status: Secondary | ICD-10-CM | POA: Diagnosis not present

## 2017-11-24 DIAGNOSIS — Z85048 Personal history of other malignant neoplasm of rectum, rectosigmoid junction, and anus: Secondary | ICD-10-CM | POA: Diagnosis not present

## 2017-11-24 DIAGNOSIS — Z86718 Personal history of other venous thrombosis and embolism: Secondary | ICD-10-CM | POA: Diagnosis not present

## 2017-11-25 ENCOUNTER — Encounter (HOSPITAL_COMMUNITY): Payer: Self-pay | Admitting: Adult Health

## 2017-11-25 ENCOUNTER — Telehealth (HOSPITAL_COMMUNITY): Payer: Self-pay | Admitting: *Deleted

## 2017-11-25 ENCOUNTER — Other Ambulatory Visit (HOSPITAL_COMMUNITY): Payer: Self-pay | Admitting: Adult Health

## 2017-11-25 DIAGNOSIS — G894 Chronic pain syndrome: Secondary | ICD-10-CM

## 2017-11-25 MED ORDER — MORPHINE SULFATE ER 60 MG PO TBCR: 60.0000 mg | EXTENDED_RELEASE_TABLET | Freq: Three times a day (TID) | ORAL | 0 refills | Status: DC

## 2017-11-25 MED ORDER — OXYCODONE HCL 10 MG PO TABS: 10.0000 mg | ORAL_TABLET | ORAL | 0 refills | Status: DC | PRN

## 2017-11-25 NOTE — Telephone Encounter (Signed)
Rxs for MS Contin and oxycodone were sent to [facility].   gwd

## 2017-11-25 NOTE — Progress Notes (Signed)
Patient called cancer center requesting refill of MS Contin and oxycodone.   [place] Controlled Substance Reporting System reviewed and refill is appropriate on or after 11/25/17 for both Rxs. Medication e-scribed to his pharmacy [facility]) using [employer] 2-step verification process.    NCCSRS reviewed:     Mike Craze, NP [place] [phone]

## 2017-11-26 DIAGNOSIS — K605 Anorectal fistula: Secondary | ICD-10-CM | POA: Diagnosis not present

## 2017-11-26 DIAGNOSIS — I2510 Atherosclerotic heart disease of native coronary artery without angina pectoris: Secondary | ICD-10-CM | POA: Diagnosis not present

## 2017-11-26 DIAGNOSIS — Z933 Colostomy status: Secondary | ICD-10-CM | POA: Diagnosis not present

## 2017-11-26 DIAGNOSIS — Y842 Radiological procedure and radiotherapy as the cause of abnormal reaction of the patient, or of later complication, without mention of misadventure at the time of the procedure: Secondary | ICD-10-CM | POA: Diagnosis not present

## 2017-11-26 DIAGNOSIS — Z85048 Personal history of other malignant neoplasm of rectum, rectosigmoid junction, and anus: Secondary | ICD-10-CM | POA: Diagnosis not present

## 2017-11-26 DIAGNOSIS — Z86718 Personal history of other venous thrombosis and embolism: Secondary | ICD-10-CM | POA: Diagnosis not present

## 2017-11-26 DIAGNOSIS — Z7901 Long term (current) use of anticoagulants: Secondary | ICD-10-CM | POA: Diagnosis not present

## 2017-11-26 DIAGNOSIS — I10 Essential (primary) hypertension: Secondary | ICD-10-CM | POA: Diagnosis not present

## 2017-11-26 DIAGNOSIS — I739 Peripheral vascular disease, unspecified: Secondary | ICD-10-CM | POA: Diagnosis not present

## 2017-11-30 DIAGNOSIS — Z7901 Long term (current) use of anticoagulants: Secondary | ICD-10-CM | POA: Diagnosis not present

## 2017-11-30 DIAGNOSIS — Z85048 Personal history of other malignant neoplasm of rectum, rectosigmoid junction, and anus: Secondary | ICD-10-CM | POA: Diagnosis not present

## 2017-11-30 DIAGNOSIS — I10 Essential (primary) hypertension: Secondary | ICD-10-CM | POA: Diagnosis not present

## 2017-11-30 DIAGNOSIS — I2510 Atherosclerotic heart disease of native coronary artery without angina pectoris: Secondary | ICD-10-CM | POA: Diagnosis not present

## 2017-11-30 DIAGNOSIS — K605 Anorectal fistula: Secondary | ICD-10-CM | POA: Diagnosis not present

## 2017-11-30 DIAGNOSIS — Y842 Radiological procedure and radiotherapy as the cause of abnormal reaction of the patient, or of later complication, without mention of misadventure at the time of the procedure: Secondary | ICD-10-CM | POA: Diagnosis not present

## 2017-11-30 DIAGNOSIS — Z86718 Personal history of other venous thrombosis and embolism: Secondary | ICD-10-CM | POA: Diagnosis not present

## 2017-11-30 DIAGNOSIS — Z933 Colostomy status: Secondary | ICD-10-CM | POA: Diagnosis not present

## 2017-11-30 DIAGNOSIS — I739 Peripheral vascular disease, unspecified: Secondary | ICD-10-CM | POA: Diagnosis not present

## 2017-12-02 DIAGNOSIS — K605 Anorectal fistula: Secondary | ICD-10-CM | POA: Diagnosis not present

## 2017-12-02 DIAGNOSIS — I10 Essential (primary) hypertension: Secondary | ICD-10-CM | POA: Diagnosis not present

## 2017-12-02 DIAGNOSIS — Z86718 Personal history of other venous thrombosis and embolism: Secondary | ICD-10-CM | POA: Diagnosis not present

## 2017-12-02 DIAGNOSIS — Z85048 Personal history of other malignant neoplasm of rectum, rectosigmoid junction, and anus: Secondary | ICD-10-CM | POA: Diagnosis not present

## 2017-12-02 DIAGNOSIS — Y842 Radiological procedure and radiotherapy as the cause of abnormal reaction of the patient, or of later complication, without mention of misadventure at the time of the procedure: Secondary | ICD-10-CM | POA: Diagnosis not present

## 2017-12-02 DIAGNOSIS — Z933 Colostomy status: Secondary | ICD-10-CM | POA: Diagnosis not present

## 2017-12-02 DIAGNOSIS — I739 Peripheral vascular disease, unspecified: Secondary | ICD-10-CM | POA: Diagnosis not present

## 2017-12-02 DIAGNOSIS — I2510 Atherosclerotic heart disease of native coronary artery without angina pectoris: Secondary | ICD-10-CM | POA: Diagnosis not present

## 2017-12-02 DIAGNOSIS — Z7901 Long term (current) use of anticoagulants: Secondary | ICD-10-CM | POA: Diagnosis not present

## 2017-12-06 DIAGNOSIS — K605 Anorectal fistula: Secondary | ICD-10-CM | POA: Diagnosis not present

## 2017-12-06 DIAGNOSIS — I10 Essential (primary) hypertension: Secondary | ICD-10-CM | POA: Diagnosis not present

## 2017-12-06 DIAGNOSIS — Z7901 Long term (current) use of anticoagulants: Secondary | ICD-10-CM | POA: Diagnosis not present

## 2017-12-06 DIAGNOSIS — I739 Peripheral vascular disease, unspecified: Secondary | ICD-10-CM | POA: Diagnosis not present

## 2017-12-06 DIAGNOSIS — Z86718 Personal history of other venous thrombosis and embolism: Secondary | ICD-10-CM | POA: Diagnosis not present

## 2017-12-06 DIAGNOSIS — Z85048 Personal history of other malignant neoplasm of rectum, rectosigmoid junction, and anus: Secondary | ICD-10-CM | POA: Diagnosis not present

## 2017-12-06 DIAGNOSIS — Y842 Radiological procedure and radiotherapy as the cause of abnormal reaction of the patient, or of later complication, without mention of misadventure at the time of the procedure: Secondary | ICD-10-CM | POA: Diagnosis not present

## 2017-12-06 DIAGNOSIS — Z933 Colostomy status: Secondary | ICD-10-CM | POA: Diagnosis not present

## 2017-12-06 DIAGNOSIS — I2510 Atherosclerotic heart disease of native coronary artery without angina pectoris: Secondary | ICD-10-CM | POA: Diagnosis not present

## 2017-12-08 DIAGNOSIS — Z7901 Long term (current) use of anticoagulants: Secondary | ICD-10-CM | POA: Diagnosis not present

## 2017-12-08 DIAGNOSIS — Z86718 Personal history of other venous thrombosis and embolism: Secondary | ICD-10-CM | POA: Diagnosis not present

## 2017-12-08 DIAGNOSIS — Z85048 Personal history of other malignant neoplasm of rectum, rectosigmoid junction, and anus: Secondary | ICD-10-CM | POA: Diagnosis not present

## 2017-12-08 DIAGNOSIS — Y842 Radiological procedure and radiotherapy as the cause of abnormal reaction of the patient, or of later complication, without mention of misadventure at the time of the procedure: Secondary | ICD-10-CM | POA: Diagnosis not present

## 2017-12-08 DIAGNOSIS — I2510 Atherosclerotic heart disease of native coronary artery without angina pectoris: Secondary | ICD-10-CM | POA: Diagnosis not present

## 2017-12-08 DIAGNOSIS — I10 Essential (primary) hypertension: Secondary | ICD-10-CM | POA: Diagnosis not present

## 2017-12-08 DIAGNOSIS — I739 Peripheral vascular disease, unspecified: Secondary | ICD-10-CM | POA: Diagnosis not present

## 2017-12-08 DIAGNOSIS — Z933 Colostomy status: Secondary | ICD-10-CM | POA: Diagnosis not present

## 2017-12-08 DIAGNOSIS — K605 Anorectal fistula: Secondary | ICD-10-CM | POA: Diagnosis not present

## 2017-12-12 DIAGNOSIS — Z933 Colostomy status: Secondary | ICD-10-CM | POA: Diagnosis not present

## 2017-12-12 DIAGNOSIS — L89159 Pressure ulcer of sacral region, unspecified stage: Secondary | ICD-10-CM | POA: Diagnosis not present

## 2017-12-13 DIAGNOSIS — Z7901 Long term (current) use of anticoagulants: Secondary | ICD-10-CM | POA: Diagnosis not present

## 2017-12-13 DIAGNOSIS — Z85048 Personal history of other malignant neoplasm of rectum, rectosigmoid junction, and anus: Secondary | ICD-10-CM | POA: Diagnosis not present

## 2017-12-13 DIAGNOSIS — Y842 Radiological procedure and radiotherapy as the cause of abnormal reaction of the patient, or of later complication, without mention of misadventure at the time of the procedure: Secondary | ICD-10-CM | POA: Diagnosis not present

## 2017-12-13 DIAGNOSIS — I10 Essential (primary) hypertension: Secondary | ICD-10-CM | POA: Diagnosis not present

## 2017-12-13 DIAGNOSIS — Z933 Colostomy status: Secondary | ICD-10-CM | POA: Diagnosis not present

## 2017-12-13 DIAGNOSIS — K605 Anorectal fistula: Secondary | ICD-10-CM | POA: Diagnosis not present

## 2017-12-13 DIAGNOSIS — I2510 Atherosclerotic heart disease of native coronary artery without angina pectoris: Secondary | ICD-10-CM | POA: Diagnosis not present

## 2017-12-13 DIAGNOSIS — I739 Peripheral vascular disease, unspecified: Secondary | ICD-10-CM | POA: Diagnosis not present

## 2017-12-13 DIAGNOSIS — Z86718 Personal history of other venous thrombosis and embolism: Secondary | ICD-10-CM | POA: Diagnosis not present

## 2017-12-15 DIAGNOSIS — I10 Essential (primary) hypertension: Secondary | ICD-10-CM | POA: Diagnosis not present

## 2017-12-15 DIAGNOSIS — I2510 Atherosclerotic heart disease of native coronary artery without angina pectoris: Secondary | ICD-10-CM | POA: Diagnosis not present

## 2017-12-15 DIAGNOSIS — Z7901 Long term (current) use of anticoagulants: Secondary | ICD-10-CM | POA: Diagnosis not present

## 2017-12-15 DIAGNOSIS — K605 Anorectal fistula: Secondary | ICD-10-CM | POA: Diagnosis not present

## 2017-12-15 DIAGNOSIS — Z933 Colostomy status: Secondary | ICD-10-CM | POA: Diagnosis not present

## 2017-12-15 DIAGNOSIS — Z86718 Personal history of other venous thrombosis and embolism: Secondary | ICD-10-CM | POA: Diagnosis not present

## 2017-12-15 DIAGNOSIS — I739 Peripheral vascular disease, unspecified: Secondary | ICD-10-CM | POA: Diagnosis not present

## 2017-12-15 DIAGNOSIS — Z85048 Personal history of other malignant neoplasm of rectum, rectosigmoid junction, and anus: Secondary | ICD-10-CM | POA: Diagnosis not present

## 2017-12-15 DIAGNOSIS — Y842 Radiological procedure and radiotherapy as the cause of abnormal reaction of the patient, or of later complication, without mention of misadventure at the time of the procedure: Secondary | ICD-10-CM | POA: Diagnosis not present

## 2017-12-16 DIAGNOSIS — L988 Other specified disorders of the skin and subcutaneous tissue: Secondary | ICD-10-CM | POA: Diagnosis not present

## 2017-12-16 DIAGNOSIS — L598 Other specified disorders of the skin and subcutaneous tissue related to radiation: Secondary | ICD-10-CM | POA: Diagnosis not present

## 2017-12-16 DIAGNOSIS — R6889 Other general symptoms and signs: Secondary | ICD-10-CM | POA: Diagnosis not present

## 2017-12-20 DIAGNOSIS — I2510 Atherosclerotic heart disease of native coronary artery without angina pectoris: Secondary | ICD-10-CM | POA: Diagnosis not present

## 2017-12-20 DIAGNOSIS — Y842 Radiological procedure and radiotherapy as the cause of abnormal reaction of the patient, or of later complication, without mention of misadventure at the time of the procedure: Secondary | ICD-10-CM | POA: Diagnosis not present

## 2017-12-20 DIAGNOSIS — I10 Essential (primary) hypertension: Secondary | ICD-10-CM | POA: Diagnosis not present

## 2017-12-20 DIAGNOSIS — Z85048 Personal history of other malignant neoplasm of rectum, rectosigmoid junction, and anus: Secondary | ICD-10-CM | POA: Diagnosis not present

## 2017-12-20 DIAGNOSIS — Z86718 Personal history of other venous thrombosis and embolism: Secondary | ICD-10-CM | POA: Diagnosis not present

## 2017-12-20 DIAGNOSIS — K605 Anorectal fistula: Secondary | ICD-10-CM | POA: Diagnosis not present

## 2017-12-20 DIAGNOSIS — Z7901 Long term (current) use of anticoagulants: Secondary | ICD-10-CM | POA: Diagnosis not present

## 2017-12-20 DIAGNOSIS — I739 Peripheral vascular disease, unspecified: Secondary | ICD-10-CM | POA: Diagnosis not present

## 2017-12-20 DIAGNOSIS — Z933 Colostomy status: Secondary | ICD-10-CM | POA: Diagnosis not present

## 2017-12-22 ENCOUNTER — Other Ambulatory Visit (HOSPITAL_COMMUNITY): Payer: Self-pay | Admitting: Adult Health

## 2017-12-22 ENCOUNTER — Encounter (HOSPITAL_COMMUNITY): Payer: Self-pay | Admitting: Adult Health

## 2017-12-22 DIAGNOSIS — G894 Chronic pain syndrome: Secondary | ICD-10-CM

## 2017-12-22 MED ORDER — MORPHINE SULFATE ER 60 MG PO TBCR: 60.0000 mg | EXTENDED_RELEASE_TABLET | Freq: Three times a day (TID) | ORAL | 0 refills | Status: DC

## 2017-12-22 MED ORDER — OXYCODONE HCL 10 MG PO TABS: 10.0000 mg | ORAL_TABLET | ORAL | 0 refills | Status: DC | PRN

## 2017-12-22 NOTE — Progress Notes (Signed)
Patient called cancer center requesting refill of MS Contin and oxycodone.   [place] Controlled Substance Reporting System reviewed and refill is appropriate on or after 12/25/17. Medication e-scribed to his pharmacy [facility]) using [employer] 2-step verification process.    *I noticed as I reviewed his chart that he does not have upcoming follow-up visit scheduled at cancer center. I sent a scheduling message to our team to arrange follow-up visit in 01/2018. If he does not remain compliant with follow-up visits, then we will no longer be able to prescribe his pain medications.      NCCSRS reviewed:     Mike Craze, NP [place] [phone]

## 2017-12-26 DIAGNOSIS — Z85048 Personal history of other malignant neoplasm of rectum, rectosigmoid junction, and anus: Secondary | ICD-10-CM | POA: Diagnosis not present

## 2017-12-26 DIAGNOSIS — I739 Peripheral vascular disease, unspecified: Secondary | ICD-10-CM | POA: Diagnosis not present

## 2017-12-26 DIAGNOSIS — I10 Essential (primary) hypertension: Secondary | ICD-10-CM | POA: Diagnosis not present

## 2017-12-26 DIAGNOSIS — I2510 Atherosclerotic heart disease of native coronary artery without angina pectoris: Secondary | ICD-10-CM | POA: Diagnosis not present

## 2017-12-26 DIAGNOSIS — K605 Anorectal fistula: Secondary | ICD-10-CM | POA: Diagnosis not present

## 2017-12-26 DIAGNOSIS — Z933 Colostomy status: Secondary | ICD-10-CM | POA: Diagnosis not present

## 2017-12-26 DIAGNOSIS — Y842 Radiological procedure and radiotherapy as the cause of abnormal reaction of the patient, or of later complication, without mention of misadventure at the time of the procedure: Secondary | ICD-10-CM | POA: Diagnosis not present

## 2017-12-26 DIAGNOSIS — Z86718 Personal history of other venous thrombosis and embolism: Secondary | ICD-10-CM | POA: Diagnosis not present

## 2017-12-26 DIAGNOSIS — Z7901 Long term (current) use of anticoagulants: Secondary | ICD-10-CM | POA: Diagnosis not present

## 2017-12-28 DIAGNOSIS — I82401 Acute embolism and thrombosis of unspecified deep veins of right lower extremity: Secondary | ICD-10-CM | POA: Diagnosis not present

## 2017-12-28 DIAGNOSIS — I2510 Atherosclerotic heart disease of native coronary artery without angina pectoris: Secondary | ICD-10-CM | POA: Diagnosis not present

## 2017-12-28 DIAGNOSIS — K603 Anal fistula: Secondary | ICD-10-CM | POA: Diagnosis not present

## 2017-12-28 DIAGNOSIS — I10 Essential (primary) hypertension: Secondary | ICD-10-CM | POA: Diagnosis not present

## 2017-12-31 ENCOUNTER — Emergency Department (HOSPITAL_COMMUNITY): Payer: Medicare HMO

## 2017-12-31 ENCOUNTER — Emergency Department (HOSPITAL_COMMUNITY)
Admission: EM | Admit: 2017-12-31 | Discharge: 2017-12-31 | Disposition: A | Discharge status: Home / Self Care | Payer: Medicare HMO | Attending: Emergency Medicine | Admitting: Emergency Medicine

## 2017-12-31 ENCOUNTER — Encounter (HOSPITAL_COMMUNITY): Payer: Self-pay | Admitting: Cardiology

## 2017-12-31 DIAGNOSIS — I10 Essential (primary) hypertension: Secondary | ICD-10-CM | POA: Diagnosis not present

## 2017-12-31 DIAGNOSIS — Z7982 Long term (current) use of aspirin: Secondary | ICD-10-CM | POA: Diagnosis not present

## 2017-12-31 DIAGNOSIS — K529 Noninfective gastroenteritis and colitis, unspecified: Secondary | ICD-10-CM | POA: Diagnosis not present

## 2017-12-31 DIAGNOSIS — K9401 Colostomy hemorrhage: Secondary | ICD-10-CM | POA: Diagnosis not present

## 2017-12-31 DIAGNOSIS — Z79899 Other long term (current) drug therapy: Secondary | ICD-10-CM | POA: Insufficient documentation

## 2017-12-31 DIAGNOSIS — I2510 Atherosclerotic heart disease of native coronary artery without angina pectoris: Secondary | ICD-10-CM | POA: Diagnosis not present

## 2017-12-31 DIAGNOSIS — Z85038 Personal history of other malignant neoplasm of large intestine: Secondary | ICD-10-CM | POA: Diagnosis not present

## 2017-12-31 DIAGNOSIS — F17210 Nicotine dependence, cigarettes, uncomplicated: Secondary | ICD-10-CM | POA: Insufficient documentation

## 2017-12-31 DIAGNOSIS — R195 Other fecal abnormalities: Secondary | ICD-10-CM | POA: Diagnosis present

## 2017-12-31 LAB — CBC WITH DIFFERENTIAL/PLATELET
Basophils Absolute: 0 10*3/uL (ref 0.0–0.1)
Basophils Relative: 0 %
Eosinophils Absolute: 0.1 10*3/uL (ref 0.0–0.7)
Eosinophils Relative: 2 %
HCT: 52.7 % — ABNORMAL HIGH (ref 39.0–52.0)
Hemoglobin: 18 g/dL — ABNORMAL HIGH (ref 13.0–17.0)
Lymphocytes Relative: 21 %
Lymphs Abs: 1.6 10*3/uL (ref 0.7–4.0)
MCH: 33.3 pg (ref 26.0–34.0)
MCHC: 34.2 g/dL (ref 30.0–36.0)
MCV: 97.6 fL (ref 78.0–100.0)
Monocytes Absolute: 0.7 10*3/uL (ref 0.1–1.0)
Monocytes Relative: 9 %
Neutro Abs: 5.1 10*3/uL (ref 1.7–7.7)
Neutrophils Relative %: 68 %
Platelets: 173 10*3/uL (ref 150–400)
RBC: 5.4 MIL/uL (ref 4.22–5.81)
RDW: 14.7 % (ref 11.5–15.5)
WBC: 7.4 10*3/uL (ref 4.0–10.5)

## 2017-12-31 LAB — URINALYSIS, ROUTINE W REFLEX MICROSCOPIC
Bilirubin Urine: NEGATIVE
Glucose, UA: NEGATIVE mg/dL
Hgb urine dipstick: NEGATIVE
Ketones, ur: NEGATIVE mg/dL
Leukocytes, UA: NEGATIVE
Nitrite: NEGATIVE
Protein, ur: NEGATIVE mg/dL
Specific Gravity, Urine: 1.027 (ref 1.005–1.030)
pH: 5 (ref 5.0–8.0)

## 2017-12-31 LAB — COMPREHENSIVE METABOLIC PANEL
ALT: 17 U/L (ref 17–63)
AST: 23 U/L (ref 15–41)
Albumin: 3.7 g/dL (ref 3.5–5.0)
Alkaline Phosphatase: 86 U/L (ref 38–126)
Anion gap: 11 (ref 5–15)
BUN: 13 mg/dL (ref 6–20)
CO2: 24 mmol/L (ref 22–32)
Calcium: 9.3 mg/dL (ref 8.9–10.3)
Chloride: 107 mmol/L (ref 101–111)
Creatinine, Ser: 1.23 mg/dL (ref 0.61–1.24)
GFR calc Af Amer: >60 mL/min (ref >60)
GFR calc non Af Amer: >60 mL/min (ref >60)
Glucose, Bld: 90 mg/dL (ref 65–99)
Potassium: 4.4 mmol/L (ref 3.5–5.1)
Sodium: 142 mmol/L (ref 135–145)
Total Bilirubin: 1.2 mg/dL (ref 0.3–1.2)
Total Protein: 8 g/dL (ref 6.5–8.1)

## 2017-12-31 LAB — LIPASE, BLOOD: Lipase: 35 U/L (ref 11–51)

## 2017-12-31 MED ORDER — AMOXICILLIN-POT CLAVULANATE 875-125 MG PO TABS: 1.0000 | ORAL_TABLET | Freq: Two times a day (BID) | ORAL | 0 refills | Status: AC

## 2017-12-31 MED ORDER — IOPAMIDOL (ISOVUE-300) INJECTION 61%
100.0000 mL | Freq: Once | INTRAVENOUS | Status: AC | PRN
  Administered 2017-12-31: 100 mL via INTRAVENOUS

## 2017-12-31 NOTE — Discharge Instructions (Addendum)
Please take all of your antibiotics until finished!   You may develop abdominal discomfort or diarrhea from the antibiotic.  You may help offset this with probiotics which you can buy or get in yogurt. Do not eat  or take the probiotics until 2 hours after your antibiotic.  Drink plenty of water and get plenty of rest.  Follow-up with your primary care physician for reevaluation of your symptoms.  Return to the emergency department if any concerning signs or symptoms develop such as high fevers, persistent vomiting, or worsening pain.

## 2017-12-31 NOTE — ED Provider Notes (Signed)
[facility] EMERGENCY DEPARTMENT Provider Note   CSN: [id number] Arrival date & time: 12/31/17  1018     History   Chief Complaint Chief Complaint  Patient presents with  . GI Problem    HPI Juan Wheeler is a 57 y.o. male with history of colon cancer status post colostomy/colectomy, CAD, DVT, A. fib, GERD presents today for evaluation of acute onset of GI bleed.  Patient states that earlier this morning he was running to catch up with his wife.  He states that shortly thereafter he noticed dark red blood in his ostomy bag.  He denies abdominal pain, nausea, vomiting, fevers, chills, chest pain, or shortness of breath.  He also notes stool leaking from his ostomy bag.  He tells me "I was told if I ever saw blood in the bag I should come to the emergency department right away ".  He is on Eliquis and has not had his morning dose but has otherwise been compliant with this medication.  No aggravating or alleviating factors noted.  He denies any recent trauma or falls or heavy lifting.  The history is provided by the patient.    Past Medical History:  Diagnosis Date  . Chronic lower back pain    a. Followed by [facility] management at [facility].  . Colon cancer [facility])    rectal cancer  . Coronary artery disease    a. 03/2013: abnl nuc -> LHC s/p DES to LCx, residual moderate disease in LAD (med rx unless refractory angina). b. Not on BB due to bradycardia.  Marland Kitchen DVT (deep venous thrombosis) ([place]) ~ 2013  . Dysrhythmia    AFib  . GERD (gastroesophageal reflux disease)   . H/O necrotising fasciitis   . History of blood transfusion    "once; after throwing up alot of blood" (04/17/2013)  . Hypertension   . LV dysfunction    a. EF 45% in 03/2013.  Marland Kitchen PAD (peripheral artery disease) ([place])    a. Occlusion of the right internal iliac artery, with significant atherosclerosis in the left internal iliac which was not amenable to reconstruction per notes from [name].  . Pulmonary nodules 09/30/2014    . Rectal cancer ([place])   . Tobacco abuse     Patient Active Problem List   Diagnosis Date Noted  . History of DVT (deep vein thrombosis) 07/08/2016  . Paroxysmal atrial fibrillation ([place]) 07/08/2016  . SOB (shortness of breath) 07/08/2016  . Sepsis secondary to UTI ([place]) 07/08/2016  . Hypokalemia   . Gastroesophageal reflux disease   . Coronary artery disease involving native coronary artery of native heart with angina pectoris ([place])   . AKI (acute kidney injury) ([place])   . Fournier's gangrene in male 06/05/2016  . Abscess 06/05/2016  . Sepsis ([place])   . Necrotizing fasciitis ([place])   . Central venous catheter in place   . Acute respiratory failure ([place])   . PAD (peripheral artery disease) ([place]) 07/02/2015  . Preoperative cardiovascular examination 06/12/2015  . Cardiomyopathy, ischemic 06/12/2015  . Pulmonary nodules 09/30/2014  . ASCVD (arteriosclerotic cardiovascular disease) 05/02/2013  . Unstable angina ([place]) 04/18/2013  . Chest pain 03/29/2013  . Tobacco abuse 03/29/2013  . Chronic pain syndrome 11/09/2012  . Pain in joint, pelvic region and thigh 11/09/2012  . Neuralgia and neuritis 10/12/2012  . Atherosclerosis of native arteries of extremity with intermittent claudication ([place]) 08/19/2012  . Backache 03/24/2012  . Peripheral vascular disease ([place]) 12/04/2011  . Compression of vein 12/04/2011  .  Heartburn 12/03/2011  . Personal history of digestive disease 11/19/2011  . Depressive disorder 09/24/2011  . Dysuria 08/31/2011  . Impotence of organic origin 08/31/2011  . Urinary frequency 08/31/2011  . Constipation 06/05/2011  . Essential hypertension 06/05/2011  . Malignant neoplasm of rectum ([place]) 12/02/2010    Past Surgical History:  Procedure Laterality Date  . ABDOMINAL SURGERY  1990's   'for stomach ulcers" (04/17/2013)  . COLECTOMY  2012   "for rectal cancer" (04/17/2013)  . COLONOSCOPY  2013   Dr. Cheryll Cockayne: colorectal anastomosis with ulcer and  inflammation, benign biopsy  . COLONOSCOPY WITH PROPOFOL N/A 09/07/2016   Procedure: COLONOSCOPY WITH PROPOFOL;  Surgeon: Daneil Dolin, MD;  [facility];  Service: Endoscopy;  Laterality: N/A;  10:00 am Colonoscopy via rectum and ostomy  . COLOSTOMY TAKEDOWN  2013  . CORONARY ANGIOPLASTY WITH STENT PLACEMENT  04/17/2013   "?1" (04/17/2013)  . FEMORAL-POPLITEAL BYPASS GRAFT Left 07/02/2015   Procedure: BYPASS GRAFT LEFT COMMON FEMORAL ARTERY TO LEFT ABOVE KNEE POPLITEAL ARTERY - USING LEFT GREATER SAPPHENOUS VEIN;  Surgeon: Elam Dutch, MD;  Location: [place];  Service: Vascular;  Laterality: Left;  . INCISION AND DRAINAGE ABSCESS Left 06/05/2016   Procedure: INCISION AND DRAINAGE ABSCESS;  Surgeon: Aviva Signs, MD;  Location: [facility] ORS;  Service: General;  Laterality: Left;  . INCISION AND DRAINAGE PERIRECTAL ABSCESS Left 06/07/2016   Procedure: IRRIGATION AND DEBRIDEMENT LEFT BUTTOCK ABSCESS;  Surgeon: Greer Pickerel, MD;  Location: [place];  Service: General;  Laterality: Left;  . INGUINAL HERNIA REPAIR Bilateral 1990's  . LAPAROSCOPIC PARTIAL COLECTOMY N/A 06/11/2016   Procedure: LAPAROSCOPIC  OPEN COLOSTOMY;  Surgeon: [name], MD;  Location: [place];  Service: General;  Laterality: N/A;  . LEFT HEART CATHETERIZATION WITH CORONARY ANGIOGRAM N/A 04/17/2013   Procedure: LEFT HEART CATHETERIZATION WITH CORONARY ANGIOGRAM;  Surgeon: Peter M Martinique, MD;  Location: [facility] CATH LAB;  Service: Cardiovascular;  Laterality: N/A;  . PERCUTANEOUS STENT INTERVENTION  04/17/2013   Procedure: PERCUTANEOUS STENT INTERVENTION;  Surgeon: Peter M Martinique, MD;  Location: [facility] CATH LAB;  Service: Cardiovascular;;  . PERIPHERAL VASCULAR CATHETERIZATION N/A 06/14/2015   Procedure: Abdominal Aortogram;  Surgeon: Elam Dutch, MD;  Location: [place] CV LAB;  Service: Cardiovascular;  Laterality: N/A;  . PERIPHERAL VASCULAR CATHETERIZATION Bilateral 06/14/2015   Procedure: Lower Extremity Angiography;   Surgeon: Elam Dutch, MD;  Location: [place] CV LAB;  Service: Cardiovascular;  Laterality: Bilateral;  . VEIN HARVEST Left 07/02/2015   Procedure: VEIN HARVEST - LEFT GREATER SAPPHENOUS VEIN;  Surgeon: Elam Dutch, MD;  Location: [place];  Service: Vascular;  Laterality: Left;        Home Medications    Prior to Admission medications   Medication Sig Start Date End Date Taking? Authorizing Provider  amLODipine (NORVASC) 5 MG tablet Take 5 mg by mouth daily. 07/20/16  Yes [provider]  apixaban (ELIQUIS) 5 MG TABS tablet Take 1 tablet (5 mg total) by mouth 2 (two) times daily. 06/16/16  Yes Domenic Polite, MD  aspirin EC 81 MG tablet Take 81 mg by mouth daily.   Yes [provider]  isosorbide mononitrate (IMDUR) 30 MG 24 hr tablet TAKE 1/2 TABLET BY MOUTH ONCE DAILY. Patient taking differently: Take 15 mg by mouth daily.  12/04/15  Yes Herminio Commons, MD  lisinopril (PRINIVIL,ZESTRIL) 40 MG tablet Take 40 mg by mouth daily. 07/20/16  Yes [provider]  lovastatin (MEVACOR) 10  MG tablet Take 1 tablet (10 mg total) by mouth at bedtime. 07/04/15  Yes Virgina Jock A, [name]  metoprolol tartrate (LOPRESSOR) 25 MG tablet Take 1 tablet (25 mg total) by mouth 2 (two) times daily. 06/16/16  Yes Domenic Polite, MD  morphine (MS CONTIN) 60 MG 12 hr tablet Take 1 tablet (60 mg total) by mouth every 8 (eight) hours. 12/25/17  Yes Holley Bouche, NP  NITROSTAT 0.4 MG SL tablet PLACE 1 TABLET UNDER THE TONGUE AS NEEDED FOR CHEST PAIN UP TO 3 DOSES 10/06/16  Yes Herminio Commons, MD  omeprazole (PRILOSEC) 40 MG capsule Take 40 mg by mouth daily. 07/20/16  Yes [provider]  Oxycodone HCl 10 MG TABS Take 1-2 tablets (10-20 mg total) by mouth every 4 (four) hours as needed. 12/25/17  Yes Holley Bouche, NP  vitamin C (VITAMIN C) 500 MG tablet Take 1 tablet (500 mg total) by mouth daily. 06/17/16  Yes Domenic Polite, MD  zinc sulfate 220 (50  Zn) MG capsule Take 1 capsule (220 mg total) by mouth daily. 06/17/16  Yes Domenic Polite, MD  amoxicillin-clavulanate (AUGMENTIN) 875-125 MG tablet Take 1 tablet by mouth every 12 (twelve) hours for 7 days. 12/31/17 01/07/18  Rodell Perna A, [name]  polyethylene glycol-electrolytes (TRILYTE) 420 g solution Take 4,000 mLs by mouth as directed. Patient not taking: Reported on 12/31/2017 08/12/16   Rourk, Cristopher Estimable, MD    Family History Family History  Problem Relation Age of Onset  . Cancer Mother   . Hypertension Mother   . Bleeding Disorder Brother     Social History Social History   Tobacco Use  . Smoking status: Light Tobacco Smoker    Packs/day: 0.25    Years: 40.00    Pack years: 10.00    Types: Cigarettes    Start date: 03/14/1974  . Smokeless tobacco: Never Used  . Tobacco comment: 5-6 per day 06/12/15  Substance Use Topics  . Alcohol use: Yes    Alcohol/week: 1.8 oz    Types: 3 Cans of beer per week  . Drug use: Yes    Types: Marijuana    Comment: 2 days ago      Allergies   Darvocet [propoxyphene n-acetaminophen] and Propoxyphene   Review of Systems Review of Systems  Constitutional: Negative for chills and fever.  Gastrointestinal: Positive for blood in stool. Negative for abdominal pain, nausea and vomiting.  All other systems reviewed and are negative.    Physical Exam Updated Vital Signs BP (!) 125/93   Pulse 76   Temp 97.6 F (36.4 C) (Oral)   Resp (!) 8   Ht 5\' 9"  (1.753 m)   Wt 76.7 kg (169 lb)   SpO2 96%   BMI 24.96 kg/m   Physical Exam  Constitutional: He appears well-developed and well-nourished. No distress.  HENT:  Head: Normocephalic and atraumatic.  Eyes: Conjunctivae are normal. Right eye exhibits no discharge. Left eye exhibits no discharge.  Neck: No JVD present. No tracheal deviation present.  Cardiovascular: Normal rate and regular rhythm.  Pulmonary/Chest: Effort normal and breath sounds normal.  Abdominal: Soft. Bowel sounds are  normal. He exhibits no distension. There is tenderness. There is guarding.  Ostomy site to the right side of the abdomen.  There is some blood and stool leaking out of the ostomy site.  There is no surrounding erythema or induration.  There is tenderness to palpation of the right lower quadrant, suprapubic region, and mild tenderness to  palpation of the right upper quadrant.  There is dark red blood noted in addition to stool in the ostomy bag.  There is some surrounding prolapse at the stoma site of pink healthy-appearing tissue.  Musculoskeletal: He exhibits no edema.  Neurological: He is alert.  Skin: Skin is warm and dry. No erythema.  Psychiatric: He has a normal mood and affect. His behavior is normal.  Nursing note and vitals reviewed.    ED Treatments / Results  Labs (all labs ordered are listed, but only abnormal results are displayed) Labs Reviewed  CBC WITH DIFFERENTIAL/PLATELET - Abnormal; Notable for the following components:      Result Value   Hemoglobin 18.0 (*)    HCT 52.7 (*)    All other components within normal limits  COMPREHENSIVE METABOLIC PANEL  LIPASE, BLOOD  URINALYSIS, ROUTINE W REFLEX MICROSCOPIC    EKG None  Radiology Ct Abdomen Pelvis W Contrast  Result Date: 12/31/2017 CLINICAL DATA:  Bleeding from colostomy. EXAM: CT ABDOMEN AND PELVIS WITH CONTRAST TECHNIQUE: Multidetector CT imaging of the abdomen and pelvis was performed using the standard protocol following bolus administration of intravenous contrast. CONTRAST:  124mL ISOVUE-300 IOPAMIDOL (ISOVUE-300) INJECTION 61% COMPARISON:  None. FINDINGS: Lower chest: Motion degradation limits assessment of lung parenchyma in the bases. Hepatobiliary: No focal abnormality within the liver parenchyma. There is no evidence for gallstones, gallbladder wall thickening, or pericholecystic fluid. No intrahepatic or extrahepatic biliary dilation. Pancreas: No focal mass lesion. No dilatation of the main duct. No  intraparenchymal cyst. No peripancreatic edema. Spleen: No splenomegaly. No focal mass lesion. Adrenals/Urinary Tract: No adrenal nodule or mass. Kidneys unremarkable. No evidence for hydroureter. The urinary bladder appears normal for the degree of distention. Stomach/Bowel: Stomach is nondistended. No gastric wall thickening. No evidence of outlet obstruction. Prominent mucosal folds in the jejunum. Ileum unremarkable. Patient has an ostomy in the right abdomen, apparently an end colostomy although the marked motion degradation hinders assessment. There appears to be along Hartmann's pouch with blind end identified at the mid transverse colon level. There is some edema/inflammation along the right colon tracking into the ostomy, and associated wall thickening. Features are better demonstrated on the renal delay series with less motion artifact Vascular/Lymphatic: There is abdominal aortic atherosclerosis without aneurysm. Prominent atherosclerotic plaque noted at the origin of the right common iliac artery. There is no gastrohepatic or hepatoduodenal ligament lymphadenopathy. No intraperitoneal or retroperitoneal lymphadenopathy. No pelvic sidewall lymphadenopathy. Reproductive: Prostate gland obscured by motion. Other: There is abnormal presacral soft tissue attenuation with a gas collection identified between the rectum and the sacrum. Musculoskeletal: Bone windows reveal no worrisome lytic or sclerotic osseous lesions. Scarring is noted in the medial left gluteal region. IMPRESSION: 1. Wall thickening in the right colon, tracking into the colostomy with subtle pericolonic edema/inflammation. Infectious/inflammatory colitis would be a consideration. There is protrusion of the ostomy beyond the skin. 2. Prominent mucosal folds in the jejunum without obstruction. No perienteric edema to suggest enteritis. 3. Abnormal presacral soft tissue is presumably treatment related. There is a small gas collection between the  sacrum and the distal rectum. Small abscess cavity could have this appearance. Presacral gas was seen on a remote study from 06/05/2016, but was more on the left than the right at that time. The amount of gas has decreased since 2017. 4.  Aortic Atherosclerois (ICD10-170.0) Electronically Signed   By: Misty Stanley M.D.   On: 12/31/2017 13:55    Procedures Procedures (including critical care time)  Medications  Ordered in ED Medications  iopamidol (ISOVUE-300) 61 % injection 100 mL (100 mLs Intravenous Contrast Given 12/31/17 1309)     Initial Impression / Assessment and Plan / ED Course  I have reviewed the triage vital signs and the nursing notes.  Pertinent labs & imaging results that were available during my care of the patient were reviewed by me and considered in my medical decision making (see chart for details).     Patient with history of rectal cancer status post colectomy with colostomy pouch presents today for evaluation of acute onset of blood in stool.  He is afebrile, initially mildly hypertensive in the ED with improvement on reevaluation.  He is nontoxic in appearance.  He does have right-sided abdominal tenderness to palpation.  He does have protrusion of his ostomy beyond the skin on examination but no evidence of surrounding secondary infection or abscess.  Lab work reviewed by me showed no leukocytosis, no anemia, no electrolyte abnormalities.  Lipase, creatinine, and LFTs are within normal limits.  UA is not concerning for UTI or nephrolithiasis.  CT scan of the abdomen and pelvis shows wall thickening of the right colon suggestive of infectious or inflammatory colitis.  He is afebrile and hemodynamically stable.  We will treat with a course of Augmentin on an outpatient basis.  On reevaluation, the patient is resting comfortably in no apparent distress and states he is feeling improved.  Serial abdominal examinations remain benign.  I doubt obstruction, perforation,  appendicitis, AAA, or other acute surgical abdominal pathology.  He will follow-up with his primary care physician for reevaluation on an outpatient basis.  Discussed indications for return to the ED. Pt verbalized understanding of and agreement with plan and is safe for discharge home at this time.   Final Clinical Impressions(s) / ED Diagnoses   Final diagnoses:  Colitis    ED Discharge Orders        Ordered    amoxicillin-clavulanate (AUGMENTIN) 875-125 MG tablet  Every 12 hours     12/31/17 1452       Renita Papa, [name] 12/31/17 1640    Virgel Manifold, MD 01/03/18 [phone]

## 2017-12-31 NOTE — ED Triage Notes (Signed)
Noticed blood in colostomy pouch this morning.  C/o lower abdominal pain last night

## 2018-01-03 DIAGNOSIS — Z933 Colostomy status: Secondary | ICD-10-CM | POA: Diagnosis not present

## 2018-01-03 DIAGNOSIS — Z7901 Long term (current) use of anticoagulants: Secondary | ICD-10-CM | POA: Diagnosis not present

## 2018-01-03 DIAGNOSIS — I739 Peripheral vascular disease, unspecified: Secondary | ICD-10-CM | POA: Diagnosis not present

## 2018-01-03 DIAGNOSIS — I10 Essential (primary) hypertension: Secondary | ICD-10-CM | POA: Diagnosis not present

## 2018-01-03 DIAGNOSIS — I2510 Atherosclerotic heart disease of native coronary artery without angina pectoris: Secondary | ICD-10-CM | POA: Diagnosis not present

## 2018-01-03 DIAGNOSIS — Z85048 Personal history of other malignant neoplasm of rectum, rectosigmoid junction, and anus: Secondary | ICD-10-CM | POA: Diagnosis not present

## 2018-01-03 DIAGNOSIS — K605 Anorectal fistula: Secondary | ICD-10-CM | POA: Diagnosis not present

## 2018-01-03 DIAGNOSIS — Y842 Radiological procedure and radiotherapy as the cause of abnormal reaction of the patient, or of later complication, without mention of misadventure at the time of the procedure: Secondary | ICD-10-CM | POA: Diagnosis not present

## 2018-01-03 DIAGNOSIS — Z86718 Personal history of other venous thrombosis and embolism: Secondary | ICD-10-CM | POA: Diagnosis not present

## 2018-01-06 DIAGNOSIS — Z933 Colostomy status: Secondary | ICD-10-CM | POA: Diagnosis not present

## 2018-01-06 DIAGNOSIS — I2510 Atherosclerotic heart disease of native coronary artery without angina pectoris: Secondary | ICD-10-CM | POA: Diagnosis not present

## 2018-01-06 DIAGNOSIS — Z7901 Long term (current) use of anticoagulants: Secondary | ICD-10-CM | POA: Diagnosis not present

## 2018-01-06 DIAGNOSIS — K605 Anorectal fistula: Secondary | ICD-10-CM | POA: Diagnosis not present

## 2018-01-06 DIAGNOSIS — Z86718 Personal history of other venous thrombosis and embolism: Secondary | ICD-10-CM | POA: Diagnosis not present

## 2018-01-06 DIAGNOSIS — Y842 Radiological procedure and radiotherapy as the cause of abnormal reaction of the patient, or of later complication, without mention of misadventure at the time of the procedure: Secondary | ICD-10-CM | POA: Diagnosis not present

## 2018-01-06 DIAGNOSIS — I10 Essential (primary) hypertension: Secondary | ICD-10-CM | POA: Diagnosis not present

## 2018-01-06 DIAGNOSIS — I739 Peripheral vascular disease, unspecified: Secondary | ICD-10-CM | POA: Diagnosis not present

## 2018-01-06 DIAGNOSIS — Z85048 Personal history of other malignant neoplasm of rectum, rectosigmoid junction, and anus: Secondary | ICD-10-CM | POA: Diagnosis not present

## 2018-01-12 DIAGNOSIS — Z7901 Long term (current) use of anticoagulants: Secondary | ICD-10-CM | POA: Diagnosis not present

## 2018-01-12 DIAGNOSIS — Y842 Radiological procedure and radiotherapy as the cause of abnormal reaction of the patient, or of later complication, without mention of misadventure at the time of the procedure: Secondary | ICD-10-CM | POA: Diagnosis not present

## 2018-01-12 DIAGNOSIS — I739 Peripheral vascular disease, unspecified: Secondary | ICD-10-CM | POA: Diagnosis not present

## 2018-01-12 DIAGNOSIS — Z933 Colostomy status: Secondary | ICD-10-CM | POA: Diagnosis not present

## 2018-01-12 DIAGNOSIS — Z86718 Personal history of other venous thrombosis and embolism: Secondary | ICD-10-CM | POA: Diagnosis not present

## 2018-01-12 DIAGNOSIS — I2510 Atherosclerotic heart disease of native coronary artery without angina pectoris: Secondary | ICD-10-CM | POA: Diagnosis not present

## 2018-01-12 DIAGNOSIS — Z85048 Personal history of other malignant neoplasm of rectum, rectosigmoid junction, and anus: Secondary | ICD-10-CM | POA: Diagnosis not present

## 2018-01-12 DIAGNOSIS — K605 Anorectal fistula: Secondary | ICD-10-CM | POA: Diagnosis not present

## 2018-01-12 DIAGNOSIS — I10 Essential (primary) hypertension: Secondary | ICD-10-CM | POA: Diagnosis not present

## 2018-01-13 DIAGNOSIS — L988 Other specified disorders of the skin and subcutaneous tissue: Secondary | ICD-10-CM | POA: Diagnosis not present

## 2018-01-13 DIAGNOSIS — L598 Other specified disorders of the skin and subcutaneous tissue related to radiation: Secondary | ICD-10-CM | POA: Diagnosis not present

## 2018-01-17 DIAGNOSIS — I2510 Atherosclerotic heart disease of native coronary artery without angina pectoris: Secondary | ICD-10-CM | POA: Diagnosis not present

## 2018-01-17 DIAGNOSIS — I739 Peripheral vascular disease, unspecified: Secondary | ICD-10-CM | POA: Diagnosis not present

## 2018-01-17 DIAGNOSIS — Z7901 Long term (current) use of anticoagulants: Secondary | ICD-10-CM | POA: Diagnosis not present

## 2018-01-17 DIAGNOSIS — Z933 Colostomy status: Secondary | ICD-10-CM | POA: Diagnosis not present

## 2018-01-17 DIAGNOSIS — Z85048 Personal history of other malignant neoplasm of rectum, rectosigmoid junction, and anus: Secondary | ICD-10-CM | POA: Diagnosis not present

## 2018-01-17 DIAGNOSIS — Z86718 Personal history of other venous thrombosis and embolism: Secondary | ICD-10-CM | POA: Diagnosis not present

## 2018-01-17 DIAGNOSIS — Y842 Radiological procedure and radiotherapy as the cause of abnormal reaction of the patient, or of later complication, without mention of misadventure at the time of the procedure: Secondary | ICD-10-CM | POA: Diagnosis not present

## 2018-01-17 DIAGNOSIS — I10 Essential (primary) hypertension: Secondary | ICD-10-CM | POA: Diagnosis not present

## 2018-01-17 DIAGNOSIS — K605 Anorectal fistula: Secondary | ICD-10-CM | POA: Diagnosis not present

## 2018-01-17 DIAGNOSIS — L89159 Pressure ulcer of sacral region, unspecified stage: Secondary | ICD-10-CM | POA: Diagnosis not present

## 2018-01-19 DIAGNOSIS — I10 Essential (primary) hypertension: Secondary | ICD-10-CM | POA: Diagnosis not present

## 2018-01-19 DIAGNOSIS — Z7901 Long term (current) use of anticoagulants: Secondary | ICD-10-CM | POA: Diagnosis not present

## 2018-01-19 DIAGNOSIS — Z85048 Personal history of other malignant neoplasm of rectum, rectosigmoid junction, and anus: Secondary | ICD-10-CM | POA: Diagnosis not present

## 2018-01-19 DIAGNOSIS — Z86718 Personal history of other venous thrombosis and embolism: Secondary | ICD-10-CM | POA: Diagnosis not present

## 2018-01-19 DIAGNOSIS — I739 Peripheral vascular disease, unspecified: Secondary | ICD-10-CM | POA: Diagnosis not present

## 2018-01-19 DIAGNOSIS — I2510 Atherosclerotic heart disease of native coronary artery without angina pectoris: Secondary | ICD-10-CM | POA: Diagnosis not present

## 2018-01-19 DIAGNOSIS — Z933 Colostomy status: Secondary | ICD-10-CM | POA: Diagnosis not present

## 2018-01-19 DIAGNOSIS — Y842 Radiological procedure and radiotherapy as the cause of abnormal reaction of the patient, or of later complication, without mention of misadventure at the time of the procedure: Secondary | ICD-10-CM | POA: Diagnosis not present

## 2018-01-19 DIAGNOSIS — K605 Anorectal fistula: Secondary | ICD-10-CM | POA: Diagnosis not present

## 2018-01-20 ENCOUNTER — Encounter (HOSPITAL_COMMUNITY): Payer: Self-pay | Admitting: Emergency Medicine

## 2018-01-20 ENCOUNTER — Emergency Department (HOSPITAL_COMMUNITY): Payer: Medicare HMO

## 2018-01-20 ENCOUNTER — Emergency Department (HOSPITAL_COMMUNITY)
Admission: EM | Admit: 2018-01-20 | Discharge: 2018-01-20 | Disposition: A | Discharge status: Home / Self Care | Payer: Medicare HMO | Attending: Emergency Medicine | Admitting: Emergency Medicine

## 2018-01-20 ENCOUNTER — Other Ambulatory Visit: Payer: Self-pay

## 2018-01-20 DIAGNOSIS — K922 Gastrointestinal hemorrhage, unspecified: Secondary | ICD-10-CM | POA: Diagnosis not present

## 2018-01-20 DIAGNOSIS — R1084 Generalized abdominal pain: Secondary | ICD-10-CM | POA: Diagnosis not present

## 2018-01-20 DIAGNOSIS — Z7901 Long term (current) use of anticoagulants: Secondary | ICD-10-CM | POA: Insufficient documentation

## 2018-01-20 DIAGNOSIS — Z85048 Personal history of other malignant neoplasm of rectum, rectosigmoid junction, and anus: Secondary | ICD-10-CM | POA: Insufficient documentation

## 2018-01-20 DIAGNOSIS — Z433 Encounter for attention to colostomy: Secondary | ICD-10-CM | POA: Diagnosis present

## 2018-01-20 DIAGNOSIS — Z79899 Other long term (current) drug therapy: Secondary | ICD-10-CM | POA: Diagnosis not present

## 2018-01-20 DIAGNOSIS — K9401 Colostomy hemorrhage: Secondary | ICD-10-CM | POA: Insufficient documentation

## 2018-01-20 DIAGNOSIS — Z7982 Long term (current) use of aspirin: Secondary | ICD-10-CM | POA: Insufficient documentation

## 2018-01-20 DIAGNOSIS — I10 Essential (primary) hypertension: Secondary | ICD-10-CM | POA: Insufficient documentation

## 2018-01-20 DIAGNOSIS — F17210 Nicotine dependence, cigarettes, uncomplicated: Secondary | ICD-10-CM | POA: Insufficient documentation

## 2018-01-20 LAB — CBC WITH DIFFERENTIAL/PLATELET
Basophils Absolute: 0 10*3/uL (ref 0.0–0.1)
Basophils Relative: 0 %
Eosinophils Absolute: 0.2 10*3/uL (ref 0.0–0.7)
Eosinophils Relative: 3 %
HCT: 54.2 % — ABNORMAL HIGH (ref 39.0–52.0)
Hemoglobin: 18.4 g/dL — ABNORMAL HIGH (ref 13.0–17.0)
Lymphocytes Relative: 21 %
Lymphs Abs: 1.8 10*3/uL (ref 0.7–4.0)
MCH: 33.3 pg (ref 26.0–34.0)
MCHC: 33.9 g/dL (ref 30.0–36.0)
MCV: 98.2 fL (ref 78.0–100.0)
Monocytes Absolute: 0.9 10*3/uL (ref 0.1–1.0)
Monocytes Relative: 10 %
Neutro Abs: 5.8 10*3/uL (ref 1.7–7.7)
Neutrophils Relative %: 66 %
Platelets: 205 10*3/uL (ref 150–400)
RBC: 5.52 MIL/uL (ref 4.22–5.81)
RDW: 14 % (ref 11.5–15.5)
WBC: 8.8 10*3/uL (ref 4.0–10.5)

## 2018-01-20 LAB — COMPREHENSIVE METABOLIC PANEL
ALT: 17 U/L (ref 17–63)
AST: 21 U/L (ref 15–41)
Albumin: 3.7 g/dL (ref 3.5–5.0)
Alkaline Phosphatase: 72 U/L (ref 38–126)
Anion gap: 10 (ref 5–15)
BUN: 13 mg/dL (ref 6–20)
CO2: 25 mmol/L (ref 22–32)
Calcium: 9.2 mg/dL (ref 8.9–10.3)
Chloride: 103 mmol/L (ref 101–111)
Creatinine, Ser: 1.36 mg/dL — ABNORMAL HIGH (ref 0.61–1.24)
GFR calc Af Amer: >60 mL/min (ref >60)
GFR calc non Af Amer: 56 mL/min — ABNORMAL LOW (ref >60)
Glucose, Bld: 108 mg/dL — ABNORMAL HIGH (ref 65–99)
Potassium: 4 mmol/L (ref 3.5–5.1)
Sodium: 138 mmol/L (ref 135–145)
Total Bilirubin: 1.2 mg/dL (ref 0.3–1.2)
Total Protein: 7.8 g/dL (ref 6.5–8.1)

## 2018-01-20 LAB — LIPASE, BLOOD: Lipase: 27 U/L (ref 11–51)

## 2018-01-20 MED ORDER — IOPAMIDOL (ISOVUE-300) INJECTION 61%
100.0000 mL | Freq: Once | INTRAVENOUS | Status: AC | PRN
  Administered 2018-01-20: 100 mL via INTRAVENOUS

## 2018-01-20 MED ORDER — ONDANSETRON HCL 4 MG/2ML IJ SOLN
4.0000 mg | Freq: Once | INTRAMUSCULAR | Status: AC
  Administered 2018-01-20: 4 mg via INTRAVENOUS
  Filled 2018-01-20: qty 2

## 2018-01-20 MED ORDER — ONDANSETRON HCL 4 MG/2ML IJ SOLN
4.0000 mg | Freq: Once | INTRAMUSCULAR | Status: AC
  Administered 2018-01-20: 4 mg via INTRAVENOUS

## 2018-01-20 MED ORDER — MORPHINE SULFATE (PF) 4 MG/ML IV SOLN
4.0000 mg | Freq: Once | INTRAVENOUS | Status: AC
  Administered 2018-01-20: 4 mg via INTRAVENOUS
  Filled 2018-01-20: qty 1

## 2018-01-20 MED ORDER — SODIUM CHLORIDE 0.9 % IV BOLUS
1000.0000 mL | Freq: Once | INTRAVENOUS | Status: AC
  Administered 2018-01-20: 1000 mL via INTRAVENOUS

## 2018-01-20 MED ORDER — ONDANSETRON HCL 4 MG/2ML IJ SOLN
INTRAMUSCULAR | Status: AC
  Filled 2018-01-20: qty 2

## 2018-01-20 MED ORDER — OXYCODONE-ACETAMINOPHEN 5-325 MG PO TABS: 1.0000 | ORAL_TABLET | ORAL | 0 refills | Status: DC | PRN

## 2018-01-20 NOTE — Discharge Instructions (Signed)
Follow-up with your general surgeon at [facility] or if symptoms worsen, suggest going directly to the [facility] emergency department.

## 2018-01-20 NOTE — ED Provider Notes (Addendum)
[facility] EMERGENCY DEPARTMENT Provider Note   CSN: [id number] Arrival date & time: 01/20/18  1150     History   Chief Complaint Chief Complaint  Patient presents with  . colostomy problem    HPI Juan Wheeler is a 57 y.o. male.  Patient reports bleeding from the colostomy on the right side of his abdomen for several days, getting worse.  He has been able to eat without vomiting.  He was seen by his primary care doctor referred to the emergency department.  Additionally, the patient can normally reduce the extraneous tissue from the colostomy, but he was unable to do this today.  Status post 06/11/2016 lap colostomy by Dr. [name].  Status post 06/07/2016 I&D of a left buttock abscess by Dr. Greer Pickerel.  Severity of symptoms moderate.  Nothing makes symptoms better or worse.     Past Medical History:  Diagnosis Date  . Chronic lower back pain    a. Followed by pain management at [facility].  . Colon cancer [facility])    rectal cancer  . Coronary artery disease    a. 03/2013: abnl nuc -> LHC s/p DES to LCx, residual moderate disease in LAD (med rx unless refractory angina). b. Not on BB due to bradycardia.  Marland Kitchen DVT (deep venous thrombosis) ([place]) ~ 2013  . Dysrhythmia    AFib  . GERD (gastroesophageal reflux disease)   . H/O necrotising fasciitis   . History of blood transfusion    "once; after throwing up alot of blood" (04/17/2013)  . Hypertension   . LV dysfunction    a. EF 45% in 03/2013.  Marland Kitchen PAD (peripheral artery disease) ([place])    a. Occlusion of the right internal iliac artery, with significant atherosclerosis in the left internal iliac which was not amenable to reconstruction per [facility].  . Pulmonary nodules 09/30/2014  . Rectal cancer ([place])   . Tobacco abuse     Patient Active Problem List   Diagnosis Date Noted  . History of DVT (deep vein thrombosis) 07/08/2016  . Paroxysmal atrial fibrillation ([place]) 07/08/2016  . SOB (shortness of breath)  07/08/2016  . Sepsis secondary to UTI ([place]) 07/08/2016  . Hypokalemia   . Gastroesophageal reflux disease   . Coronary artery disease involving native coronary artery of native heart with angina pectoris ([place])   . AKI (acute kidney injury) ([place])   . Fournier's gangrene in male 06/05/2016  . Abscess 06/05/2016  . Sepsis ([place])   . Necrotizing fasciitis ([place])   . Central venous catheter in place   . Acute respiratory failure ([place])   . PAD (peripheral artery disease) ([place]) 07/02/2015  . Preoperative cardiovascular examination 06/12/2015  . Cardiomyopathy, ischemic 06/12/2015  . Pulmonary nodules 09/30/2014  . ASCVD (arteriosclerotic cardiovascular disease) 05/02/2013  . Unstable angina ([place]) 04/18/2013  . Chest pain 03/29/2013  . Tobacco abuse 03/29/2013  . Chronic pain syndrome 11/09/2012  . Pain in joint, pelvic region and thigh 11/09/2012  . Neuralgia and neuritis 10/12/2012  . Atherosclerosis of native arteries of extremity with intermittent claudication ([place]) 08/19/2012  . Backache 03/24/2012  . Peripheral vascular disease ([place]) 12/04/2011  . Compression of vein 12/04/2011  . Heartburn 12/03/2011  . Personal history of digestive disease 11/19/2011  . Depressive disorder 09/24/2011  . Dysuria 08/31/2011  . Impotence of organic origin 08/31/2011  . Urinary frequency 08/31/2011  . Constipation 06/05/2011  . Essential hypertension 06/05/2011  . Malignant neoplasm of rectum ([place]) 12/02/2010  Past Surgical History:  Procedure Laterality Date  . ABDOMINAL SURGERY  1990's   'for stomach ulcers" (04/17/2013)  . COLECTOMY  2012   "for rectal cancer" (04/17/2013)  . COLONOSCOPY  2013   Dr. Cheryll Cockayne: colorectal anastomosis with ulcer and inflammation, benign biopsy  . COLONOSCOPY WITH PROPOFOL N/A 09/07/2016   Procedure: COLONOSCOPY WITH PROPOFOL;  Surgeon: Daneil Dolin, MD;  [facility];  Service: Endoscopy;  Laterality: N/A;  10:00 am Colonoscopy via rectum  and ostomy  . COLOSTOMY TAKEDOWN  2013  . CORONARY ANGIOPLASTY WITH STENT PLACEMENT  04/17/2013   "?1" (04/17/2013)  . FEMORAL-POPLITEAL BYPASS GRAFT Left 07/02/2015   Procedure: BYPASS GRAFT LEFT COMMON FEMORAL ARTERY TO LEFT ABOVE KNEE POPLITEAL ARTERY - USING LEFT GREATER SAPPHENOUS VEIN;  Surgeon: Elam Dutch, MD;  Location: [place];  Service: Vascular;  Laterality: Left;  . INCISION AND DRAINAGE ABSCESS Left 06/05/2016   Procedure: INCISION AND DRAINAGE ABSCESS;  Surgeon: Aviva Signs, MD;  Location: [facility] OR[facility];  Service: General;  Laterality: Left;  . INCISION AND DRAINAGE PERIRECTAL ABSCESS Left 06/07/2016   Procedure: IRRIGATION AND DEBRIDEMENT LEFT BUTTOCK ABSCESS;  Surgeon: Greer Pickerel, MD;  Location: [place];  Service: General;  Laterality: Left;  . INGUINAL HERNIA REPAIR Bilateral 1990's  . LAPAROSCOPIC PARTIAL COLECTOMY N/A 06/11/2016   Procedure: LAPAROSCOPIC  OPEN COLOSTOMY;  Surgeon: [name], MD;  Location: [place];  Service: General;  Laterality: N/A;  . LEFT HEART CATHETERIZATION WITH CORONARY ANGIOGRAM N/A 04/17/2013   Procedure: LEFT HEART CATHETERIZATION WITH CORONARY ANGIOGRAM;  Surgeon: Peter M Martinique, MD;  Location[facility] C[facility] LAB;  Service: Cardiovascular;  Laterality: N/A;  . PERCUTANEOUS STENT INTERVENTION  04/17/2013   Procedure: PERCUTANEOUS STENT INTERVENTION;  Surgeon: Peter M Martinique, MD;  Location: [facility] CATH LAB;  Service: Cardiovascular;;  . PERIPHERAL VASCULAR CATHETERIZATION N/A 06/14/2015   Procedure: Abdominal Aortogram;  Surgeon: Elam Dutch, MD;  Location: [place] CV LAB;  Service: Cardiovascular;  Laterality: N/A;  . PERIPHERAL VASCULAR CATHETERIZATION Bilateral 06/14/2015   Procedure: Lower Extremity Angiography;  Surgeon: Elam Dutch, MD;  Location: [place] CV LAB;  Service: Cardiovascular;  Laterality: Bilateral;  . VEIN HARVEST Left 07/02/2015   Procedure: VEIN HARVEST - LEFT GREATER SAPPHENOUS VEIN;  Surgeon: Elam Dutch, MD;  Location: [place];  Service: Vascular;  Laterality: Left;        Home Medications    Prior to Admission medications   Medication Sig Start Date End Date Taking? Authorizing Provider  amLODipine (NORVASC) 5 MG tablet Take 5 mg by mouth daily. 07/20/16  Yes [provider]  apixaban (ELIQUIS) 5 MG TABS tablet Take 1 tablet (5 mg total) by mouth 2 (two) times daily. 06/16/16  Yes Domenic Polite, MD  aspirin EC 81 MG tablet Take 81 mg by mouth daily.   Yes [provider]  isosorbide mononitrate (IMDUR) 30 MG 24 hr tablet TAKE 1/2 TABLET BY MOUTH ONCE DAILY. Patient taking differently: Take 15 mg by mouth daily.  12/04/15  Yes Herminio Commons, MD  lisinopril (PRINIVIL,ZESTRIL) 40 MG tablet Take 40 mg by mouth daily. 07/20/16  Yes [provider]  morphine (MS CONTIN) 60 MG 12 hr tablet Take 1 tablet (60 mg total) by mouth every 8 (eight) hours. 12/25/17  Yes Holley Bouche, NP  omeprazole (PRILOSEC) 40 MG capsule Take 40 mg by mouth daily. 07/20/16  Yes [provider]  Oxycodone HCl 10 MG TABS Take 1-2 tablets (  10-20 mg total) by mouth every 4 (four) hours as needed. 12/25/17  Yes Holley Bouche, NP  lovastatin (MEVACOR) 10 MG tablet Take 1 tablet (10 mg total) by mouth at bedtime. Patient not taking: Reported on 01/20/2018 07/04/15   Virgina Jock A, [name]  metoprolol tartrate (LOPRESSOR) 25 MG tablet Take 1 tablet (25 mg total) by mouth 2 (two) times daily. Patient not taking: Reported on 01/20/2018 06/16/16   Domenic Polite, MD  NITROSTAT 0.4 MG SL tablet PLACE 1 TABLET UNDER THE TONGUE AS NEEDED FOR CHEST PAIN UP TO 3 DOSES 10/06/16   Herminio Commons, MD  vitamin C (VITAMIN C) 500 MG tablet Take 1 tablet (500 mg total) by mouth daily. Patient not taking: Reported on 01/20/2018 06/17/16   Domenic Polite, MD    Family History Family History  Problem Relation Age of Onset  . Cancer Mother   . Hypertension Mother   . Bleeding Disorder Brother     Social  History Social History   Tobacco Use  . Smoking status: Light Tobacco Smoker    Packs/day: 0.25    Years: 40.00    Pack years: 10.00    Types: Cigarettes    Start date: 03/14/1974  . Smokeless tobacco: Never Used  . Tobacco comment: 5-6 per day 06/12/15  Substance Use Topics  . Alcohol use: Yes    Alcohol/week: 1.8 oz    Types: 3 Cans of beer per week  . Drug use: Yes    Types: Marijuana    Comment: 2 days ago      Allergies   Darvocet [propoxyphene n-acetaminophen] and Propoxyphene   Review of Systems Review of Systems  All other systems reviewed and are negative.    Physical Exam Updated Vital Signs BP 137/90 (BP Location: Right Arm)   Pulse 87   Temp 99.5 F (37.5 C) (Oral)   Resp 10   Ht 5\' 9"  (1.753 m)   Wt 76.2 kg (168 lb)   SpO2 94%   BMI 24.81 kg/m   Physical Exam  Constitutional: He is oriented to person, place, and time. He appears well-developed and well-nourished.  HENT:  Head: Normocephalic and atraumatic.  Eyes: Conjunctivae are normal.  Neck: Neck supple.  Cardiovascular: Normal rate and regular rhythm.  Pulmonary/Chest: Effort normal and breath sounds normal.  Abdominal:  Large external colostomy on right side of abdomen with bloody serous drainage in the bag.  Musculoskeletal: Normal range of motion.  Neurological: He is alert and oriented to person, place, and time.  Skin: Skin is warm and dry.  Psychiatric: He has a normal mood and affect. His behavior is normal.  Nursing note and vitals reviewed.    ED Treatments / Results  Labs (all labs ordered are listed, but only abnormal results are displayed) Labs Reviewed  CBC WITH DIFFERENTIAL/PLATELET - Abnormal; Notable for the following components:      Result Value   Hemoglobin 18.4 (*)    HCT 54.2 (*)    All other components within normal limits  COMPREHENSIVE METABOLIC PANEL - Abnormal; Notable for the following components:   Glucose, Bld 108 (*)    Creatinine, Ser 1.36 (*)     GFR calc non Af Amer 56 (*)    All other components within normal limits  LIPASE, BLOOD    EKG None  Radiology Ct Abdomen Pelvis W Contrast  Result Date: 01/20/2018 CLINICAL DATA:  Bleeding from colostomy. Acute generalized abdominal pain. EXAM: CT ABDOMEN AND PELVIS WITH CONTRAST  TECHNIQUE: Multidetector CT imaging of the abdomen and pelvis was performed using the standard protocol following bolus administration of intravenous contrast. CONTRAST:  119mL ISOVUE-300 IOPAMIDOL (ISOVUE-300) INJECTION 61% COMPARISON:  CT scan of December 31, 2017. FINDINGS: Lower chest: No acute abnormality. Hepatobiliary: No focal liver abnormality is seen. No gallstones, gallbladder wall thickening, or biliary dilatation. Pancreas: Unremarkable. No pancreatic ductal dilatation or surrounding inflammatory changes. Spleen: Normal in size without focal abnormality. Adrenals/Urinary Tract: Adrenal glands are unremarkable. Kidneys are normal, without renal calculi, focal lesion, or hydronephrosis. Bladder is unremarkable. Stomach/Bowel: Stomach appears normal. Stable appearance of colostomy seen in right side of the abdomen with significant hernia present. Mild inflammatory stranding is noted within the hernia. The appendix appears normal. Patient appears to have a Hartmann's pouch which ends in the region of the mid transverse colon. There is no evidence of bowel dilatation or obstruction. Vascular/Lymphatic: Aortic atherosclerosis. No enlarged abdominal or pelvic lymph nodes. Reproductive: Prostate is unremarkable. Other: No abnormal fluid collection is noted. Stable presacral soft tissues are noted which most likely is secondary to prior radiation or surgery. Musculoskeletal: No acute or significant osseous findings. IMPRESSION: Colostomy is noted in right side of the abdomen with significant peristomal hernia present containing bowel. There is significant skin thickening and other inflammatory changes seen involving the hernia  suggesting inflammation. No definite evidence of bowel obstruction is noted. Stable presacral soft tissues are noted which most likely is secondary to prior radiation or surgery. Aortic Atherosclerosis (ICD10-I70.0). Electronically Signed   By: Marijo Conception, M.D.   On: 01/20/2018 14:25    Procedures Procedures (including critical care time)  Medications Ordered in ED Medications  ondansetron (ZOFRAN) injection 4 mg (4 mg Intravenous Given 01/20/18 1246)  sodium chloride 0.9 % bolus 1,000 mL (0 mLs Intravenous Stopped 01/20/18 1521)  morphine 4 MG/ML injection 4 mg (4 mg Intravenous Given 01/20/18 1247)  iopamidol (ISOVUE-300) 61 % injection 100 mL (100 mLs Intravenous Contrast Given 01/20/18 1350)  ondansetron (ZOFRAN) injection 4 mg ( Intravenous Not Given 01/20/18 1417)  morphine 4 MG/ML injection 4 mg (4 mg Intravenous Given 01/20/18 1520)     Initial Impression / Assessment and Plan / ED Course  I have reviewed the triage vital signs and the nursing notes.  Pertinent labs & imaging results that were available during my care of the patient were reviewed by me and considered in my medical decision making (see chart for details).    Patient presents with a persistent herniation of his colostomy in the right side of his abdomen with significant bleeding.  CT scan reveals inflammation.  Will discuss with general surgery.   1745: Discussed with general surgeon on-call at [facility].  Patient can follow-up with Dr. [name].  Discussed these findings with the patient and his wife.  Patient will follow-up with his general surgeon at [facility] [name].  Discharge medication Percocet. Final Clinical Impressions(s) / ED Diagnoses   Final diagnoses:  Colostomy hemorrhage [facility])    ED Discharge Orders    None       Nat Christen, MD 01/20/18 1734    Nat Christen, MD 01/20/18 [place]    Nat Christen, MD 01/20/18 1836

## 2018-01-20 NOTE — ED Notes (Signed)
Patient transported to CT 

## 2018-01-20 NOTE — ED Triage Notes (Signed)
Pt states he has swelling to and blood in colostomy with decreased output, pt states normally he can lay down and "it will go back in but it won't go back in anymore", pt states started 12/31/17, was seen at PCP and sent here today

## 2018-01-20 NOTE — ED Notes (Signed)
EDP made aware pt with pain

## 2018-01-24 DIAGNOSIS — I10 Essential (primary) hypertension: Secondary | ICD-10-CM | POA: Diagnosis not present

## 2018-01-24 DIAGNOSIS — Z933 Colostomy status: Secondary | ICD-10-CM | POA: Diagnosis not present

## 2018-01-24 DIAGNOSIS — I2510 Atherosclerotic heart disease of native coronary artery without angina pectoris: Secondary | ICD-10-CM | POA: Diagnosis not present

## 2018-01-24 DIAGNOSIS — Z7901 Long term (current) use of anticoagulants: Secondary | ICD-10-CM | POA: Diagnosis not present

## 2018-01-24 DIAGNOSIS — Z85048 Personal history of other malignant neoplasm of rectum, rectosigmoid junction, and anus: Secondary | ICD-10-CM | POA: Diagnosis not present

## 2018-01-24 DIAGNOSIS — I739 Peripheral vascular disease, unspecified: Secondary | ICD-10-CM | POA: Diagnosis not present

## 2018-01-24 DIAGNOSIS — Y842 Radiological procedure and radiotherapy as the cause of abnormal reaction of the patient, or of later complication, without mention of misadventure at the time of the procedure: Secondary | ICD-10-CM | POA: Diagnosis not present

## 2018-01-24 DIAGNOSIS — Z86718 Personal history of other venous thrombosis and embolism: Secondary | ICD-10-CM | POA: Diagnosis not present

## 2018-01-24 DIAGNOSIS — K605 Anorectal fistula: Secondary | ICD-10-CM | POA: Diagnosis not present

## 2018-01-25 ENCOUNTER — Other Ambulatory Visit (HOSPITAL_COMMUNITY): Payer: Self-pay | Admitting: Adult Health

## 2018-01-25 ENCOUNTER — Telehealth (HOSPITAL_COMMUNITY): Payer: Self-pay | Admitting: *Deleted

## 2018-01-25 ENCOUNTER — Encounter (HOSPITAL_COMMUNITY): Payer: Self-pay | Admitting: Adult Health

## 2018-01-25 DIAGNOSIS — G894 Chronic pain syndrome: Secondary | ICD-10-CM

## 2018-01-25 MED ORDER — OXYCODONE HCL 10 MG PO TABS: 10.0000 mg | ORAL_TABLET | ORAL | 0 refills | Status: DC | PRN

## 2018-01-25 MED ORDER — MORPHINE SULFATE ER 60 MG PO TBCR: 60.0000 mg | EXTENDED_RELEASE_TABLET | Freq: Three times a day (TID) | ORAL | 0 refills | Status: DC

## 2018-01-25 NOTE — Progress Notes (Signed)
Patient called cancer center requesting refill of Oxycodone and MS Contin.   [place] Controlled Substance Reporting System reviewed and refill is appropriate on or after 01/25/18 for both prescriptions. Medication e-scribed to his pharmacy [facility]) using [employer] 2-step verification process.    [place] reviewed:     Mike Craze, NP [place] [phone]

## 2018-01-25 NOTE — Telephone Encounter (Signed)
Percocet was given to him by ER doctor and is not appropriate to refill since he is already on Oxycodone.    I have e-scribed refills for his MS Contin and his Oxycodone to [place].  We should consider urine drug screen at his next follow-up.  Sharyn Lull, please help me make sure this is done.    Thanks!  Mike Craze, NP [place] [phone]

## 2018-01-26 DIAGNOSIS — Z933 Colostomy status: Secondary | ICD-10-CM | POA: Diagnosis not present

## 2018-01-26 DIAGNOSIS — Z86718 Personal history of other venous thrombosis and embolism: Secondary | ICD-10-CM | POA: Diagnosis not present

## 2018-01-26 DIAGNOSIS — Y842 Radiological procedure and radiotherapy as the cause of abnormal reaction of the patient, or of later complication, without mention of misadventure at the time of the procedure: Secondary | ICD-10-CM | POA: Diagnosis not present

## 2018-01-26 DIAGNOSIS — I10 Essential (primary) hypertension: Secondary | ICD-10-CM | POA: Diagnosis not present

## 2018-01-26 DIAGNOSIS — Z85048 Personal history of other malignant neoplasm of rectum, rectosigmoid junction, and anus: Secondary | ICD-10-CM | POA: Diagnosis not present

## 2018-01-26 DIAGNOSIS — Z7901 Long term (current) use of anticoagulants: Secondary | ICD-10-CM | POA: Diagnosis not present

## 2018-01-26 DIAGNOSIS — K605 Anorectal fistula: Secondary | ICD-10-CM | POA: Diagnosis not present

## 2018-01-26 DIAGNOSIS — I2510 Atherosclerotic heart disease of native coronary artery without angina pectoris: Secondary | ICD-10-CM | POA: Diagnosis not present

## 2018-01-26 DIAGNOSIS — I739 Peripheral vascular disease, unspecified: Secondary | ICD-10-CM | POA: Diagnosis not present

## 2018-01-26 NOTE — Telephone Encounter (Signed)
He follows up on 5/31. He does not have a lab appt. Should I order any other labs besides urine drug screen?

## 2018-01-26 NOTE — Telephone Encounter (Signed)
He had labs on 4/25, so he doesn't need other labs. We can collect urine during office visit instead of in the lab; that may be easier.   Thanks! gwd

## 2018-01-27 DIAGNOSIS — K603 Anal fistula: Secondary | ICD-10-CM | POA: Diagnosis not present

## 2018-01-27 DIAGNOSIS — Z932 Ileostomy status: Secondary | ICD-10-CM | POA: Diagnosis not present

## 2018-01-27 DIAGNOSIS — Z923 Personal history of irradiation: Secondary | ICD-10-CM | POA: Diagnosis not present

## 2018-01-27 DIAGNOSIS — Z9221 Personal history of antineoplastic chemotherapy: Secondary | ICD-10-CM | POA: Diagnosis not present

## 2018-01-27 DIAGNOSIS — Z85048 Personal history of other malignant neoplasm of rectum, rectosigmoid junction, and anus: Secondary | ICD-10-CM | POA: Diagnosis not present

## 2018-01-31 DIAGNOSIS — Z7901 Long term (current) use of anticoagulants: Secondary | ICD-10-CM | POA: Diagnosis not present

## 2018-01-31 DIAGNOSIS — Z86718 Personal history of other venous thrombosis and embolism: Secondary | ICD-10-CM | POA: Diagnosis not present

## 2018-01-31 DIAGNOSIS — I739 Peripheral vascular disease, unspecified: Secondary | ICD-10-CM | POA: Diagnosis not present

## 2018-01-31 DIAGNOSIS — Z85048 Personal history of other malignant neoplasm of rectum, rectosigmoid junction, and anus: Secondary | ICD-10-CM | POA: Diagnosis not present

## 2018-01-31 DIAGNOSIS — Y842 Radiological procedure and radiotherapy as the cause of abnormal reaction of the patient, or of later complication, without mention of misadventure at the time of the procedure: Secondary | ICD-10-CM | POA: Diagnosis not present

## 2018-01-31 DIAGNOSIS — Z933 Colostomy status: Secondary | ICD-10-CM | POA: Diagnosis not present

## 2018-01-31 DIAGNOSIS — K605 Anorectal fistula: Secondary | ICD-10-CM | POA: Diagnosis not present

## 2018-01-31 DIAGNOSIS — I2510 Atherosclerotic heart disease of native coronary artery without angina pectoris: Secondary | ICD-10-CM | POA: Diagnosis not present

## 2018-01-31 DIAGNOSIS — I10 Essential (primary) hypertension: Secondary | ICD-10-CM | POA: Diagnosis not present

## 2018-02-02 DIAGNOSIS — Z86718 Personal history of other venous thrombosis and embolism: Secondary | ICD-10-CM | POA: Diagnosis not present

## 2018-02-02 DIAGNOSIS — Y842 Radiological procedure and radiotherapy as the cause of abnormal reaction of the patient, or of later complication, without mention of misadventure at the time of the procedure: Secondary | ICD-10-CM | POA: Diagnosis not present

## 2018-02-02 DIAGNOSIS — Z933 Colostomy status: Secondary | ICD-10-CM | POA: Diagnosis not present

## 2018-02-02 DIAGNOSIS — I739 Peripheral vascular disease, unspecified: Secondary | ICD-10-CM | POA: Diagnosis not present

## 2018-02-02 DIAGNOSIS — Z85048 Personal history of other malignant neoplasm of rectum, rectosigmoid junction, and anus: Secondary | ICD-10-CM | POA: Diagnosis not present

## 2018-02-02 DIAGNOSIS — K605 Anorectal fistula: Secondary | ICD-10-CM | POA: Diagnosis not present

## 2018-02-02 DIAGNOSIS — I2510 Atherosclerotic heart disease of native coronary artery without angina pectoris: Secondary | ICD-10-CM | POA: Diagnosis not present

## 2018-02-02 DIAGNOSIS — I10 Essential (primary) hypertension: Secondary | ICD-10-CM | POA: Diagnosis not present

## 2018-02-02 DIAGNOSIS — Z7901 Long term (current) use of anticoagulants: Secondary | ICD-10-CM | POA: Diagnosis not present

## 2018-02-09 DIAGNOSIS — I2510 Atherosclerotic heart disease of native coronary artery without angina pectoris: Secondary | ICD-10-CM | POA: Diagnosis not present

## 2018-02-09 DIAGNOSIS — Z7901 Long term (current) use of anticoagulants: Secondary | ICD-10-CM | POA: Diagnosis not present

## 2018-02-09 DIAGNOSIS — I10 Essential (primary) hypertension: Secondary | ICD-10-CM | POA: Diagnosis not present

## 2018-02-09 DIAGNOSIS — Z86718 Personal history of other venous thrombosis and embolism: Secondary | ICD-10-CM | POA: Diagnosis not present

## 2018-02-09 DIAGNOSIS — I739 Peripheral vascular disease, unspecified: Secondary | ICD-10-CM | POA: Diagnosis not present

## 2018-02-09 DIAGNOSIS — Z85048 Personal history of other malignant neoplasm of rectum, rectosigmoid junction, and anus: Secondary | ICD-10-CM | POA: Diagnosis not present

## 2018-02-09 DIAGNOSIS — Y842 Radiological procedure and radiotherapy as the cause of abnormal reaction of the patient, or of later complication, without mention of misadventure at the time of the procedure: Secondary | ICD-10-CM | POA: Diagnosis not present

## 2018-02-09 DIAGNOSIS — Z933 Colostomy status: Secondary | ICD-10-CM | POA: Diagnosis not present

## 2018-02-09 DIAGNOSIS — K605 Anorectal fistula: Secondary | ICD-10-CM | POA: Diagnosis not present

## 2018-02-11 DIAGNOSIS — F17210 Nicotine dependence, cigarettes, uncomplicated: Secondary | ICD-10-CM | POA: Diagnosis not present

## 2018-02-11 DIAGNOSIS — K6389 Other specified diseases of intestine: Secondary | ICD-10-CM | POA: Diagnosis not present

## 2018-02-11 DIAGNOSIS — K219 Gastro-esophageal reflux disease without esophagitis: Secondary | ICD-10-CM | POA: Diagnosis not present

## 2018-02-11 DIAGNOSIS — I517 Cardiomegaly: Secondary | ICD-10-CM | POA: Diagnosis not present

## 2018-02-11 DIAGNOSIS — I110 Hypertensive heart disease with heart failure: Secondary | ICD-10-CM | POA: Diagnosis not present

## 2018-02-11 DIAGNOSIS — I480 Paroxysmal atrial fibrillation: Secondary | ICD-10-CM | POA: Diagnosis not present

## 2018-02-11 DIAGNOSIS — I509 Heart failure, unspecified: Secondary | ICD-10-CM | POA: Diagnosis not present

## 2018-02-11 DIAGNOSIS — I255 Ischemic cardiomyopathy: Secondary | ICD-10-CM | POA: Diagnosis not present

## 2018-02-11 DIAGNOSIS — K603 Anal fistula: Secondary | ICD-10-CM | POA: Diagnosis not present

## 2018-02-11 DIAGNOSIS — I2510 Atherosclerotic heart disease of native coronary artery without angina pectoris: Secondary | ICD-10-CM | POA: Diagnosis not present

## 2018-02-14 DIAGNOSIS — K605 Anorectal fistula: Secondary | ICD-10-CM | POA: Diagnosis not present

## 2018-02-14 DIAGNOSIS — Z86718 Personal history of other venous thrombosis and embolism: Secondary | ICD-10-CM | POA: Diagnosis not present

## 2018-02-14 DIAGNOSIS — I739 Peripheral vascular disease, unspecified: Secondary | ICD-10-CM | POA: Diagnosis not present

## 2018-02-14 DIAGNOSIS — Y842 Radiological procedure and radiotherapy as the cause of abnormal reaction of the patient, or of later complication, without mention of misadventure at the time of the procedure: Secondary | ICD-10-CM | POA: Diagnosis not present

## 2018-02-14 DIAGNOSIS — Z7901 Long term (current) use of anticoagulants: Secondary | ICD-10-CM | POA: Diagnosis not present

## 2018-02-14 DIAGNOSIS — Z85048 Personal history of other malignant neoplasm of rectum, rectosigmoid junction, and anus: Secondary | ICD-10-CM | POA: Diagnosis not present

## 2018-02-14 DIAGNOSIS — I10 Essential (primary) hypertension: Secondary | ICD-10-CM | POA: Diagnosis not present

## 2018-02-14 DIAGNOSIS — I2510 Atherosclerotic heart disease of native coronary artery without angina pectoris: Secondary | ICD-10-CM | POA: Diagnosis not present

## 2018-02-14 DIAGNOSIS — Z933 Colostomy status: Secondary | ICD-10-CM | POA: Diagnosis not present

## 2018-02-16 DIAGNOSIS — I2510 Atherosclerotic heart disease of native coronary artery without angina pectoris: Secondary | ICD-10-CM | POA: Diagnosis not present

## 2018-02-16 DIAGNOSIS — I10 Essential (primary) hypertension: Secondary | ICD-10-CM | POA: Diagnosis not present

## 2018-02-16 DIAGNOSIS — Z933 Colostomy status: Secondary | ICD-10-CM | POA: Diagnosis not present

## 2018-02-16 DIAGNOSIS — Z7901 Long term (current) use of anticoagulants: Secondary | ICD-10-CM | POA: Diagnosis not present

## 2018-02-16 DIAGNOSIS — Y842 Radiological procedure and radiotherapy as the cause of abnormal reaction of the patient, or of later complication, without mention of misadventure at the time of the procedure: Secondary | ICD-10-CM | POA: Diagnosis not present

## 2018-02-16 DIAGNOSIS — K605 Anorectal fistula: Secondary | ICD-10-CM | POA: Diagnosis not present

## 2018-02-16 DIAGNOSIS — I739 Peripheral vascular disease, unspecified: Secondary | ICD-10-CM | POA: Diagnosis not present

## 2018-02-16 DIAGNOSIS — Z86718 Personal history of other venous thrombosis and embolism: Secondary | ICD-10-CM | POA: Diagnosis not present

## 2018-02-16 DIAGNOSIS — Z85048 Personal history of other malignant neoplasm of rectum, rectosigmoid junction, and anus: Secondary | ICD-10-CM | POA: Diagnosis not present

## 2018-02-18 DIAGNOSIS — K6389 Other specified diseases of intestine: Secondary | ICD-10-CM | POA: Diagnosis not present

## 2018-02-18 DIAGNOSIS — I2510 Atherosclerotic heart disease of native coronary artery without angina pectoris: Secondary | ICD-10-CM | POA: Diagnosis not present

## 2018-02-18 DIAGNOSIS — K603 Anal fistula: Secondary | ICD-10-CM | POA: Diagnosis not present

## 2018-02-18 DIAGNOSIS — I480 Paroxysmal atrial fibrillation: Secondary | ICD-10-CM | POA: Diagnosis not present

## 2018-02-18 DIAGNOSIS — I509 Heart failure, unspecified: Secondary | ICD-10-CM | POA: Diagnosis not present

## 2018-02-18 DIAGNOSIS — F17210 Nicotine dependence, cigarettes, uncomplicated: Secondary | ICD-10-CM | POA: Diagnosis not present

## 2018-02-18 DIAGNOSIS — K219 Gastro-esophageal reflux disease without esophagitis: Secondary | ICD-10-CM | POA: Diagnosis not present

## 2018-02-18 DIAGNOSIS — I110 Hypertensive heart disease with heart failure: Secondary | ICD-10-CM | POA: Diagnosis not present

## 2018-02-18 DIAGNOSIS — I255 Ischemic cardiomyopathy: Secondary | ICD-10-CM | POA: Diagnosis not present

## 2018-02-22 DIAGNOSIS — I2510 Atherosclerotic heart disease of native coronary artery without angina pectoris: Secondary | ICD-10-CM | POA: Diagnosis not present

## 2018-02-22 DIAGNOSIS — Z85048 Personal history of other malignant neoplasm of rectum, rectosigmoid junction, and anus: Secondary | ICD-10-CM | POA: Diagnosis not present

## 2018-02-22 DIAGNOSIS — Y842 Radiological procedure and radiotherapy as the cause of abnormal reaction of the patient, or of later complication, without mention of misadventure at the time of the procedure: Secondary | ICD-10-CM | POA: Diagnosis not present

## 2018-02-22 DIAGNOSIS — Z86718 Personal history of other venous thrombosis and embolism: Secondary | ICD-10-CM | POA: Diagnosis not present

## 2018-02-22 DIAGNOSIS — Z7901 Long term (current) use of anticoagulants: Secondary | ICD-10-CM | POA: Diagnosis not present

## 2018-02-22 DIAGNOSIS — I739 Peripheral vascular disease, unspecified: Secondary | ICD-10-CM | POA: Diagnosis not present

## 2018-02-22 DIAGNOSIS — I10 Essential (primary) hypertension: Secondary | ICD-10-CM | POA: Diagnosis not present

## 2018-02-22 DIAGNOSIS — Z933 Colostomy status: Secondary | ICD-10-CM | POA: Diagnosis not present

## 2018-02-22 DIAGNOSIS — K605 Anorectal fistula: Secondary | ICD-10-CM | POA: Diagnosis not present

## 2018-02-24 DIAGNOSIS — I10 Essential (primary) hypertension: Secondary | ICD-10-CM | POA: Diagnosis not present

## 2018-02-24 DIAGNOSIS — Y842 Radiological procedure and radiotherapy as the cause of abnormal reaction of the patient, or of later complication, without mention of misadventure at the time of the procedure: Secondary | ICD-10-CM | POA: Diagnosis not present

## 2018-02-24 DIAGNOSIS — K605 Anorectal fistula: Secondary | ICD-10-CM | POA: Diagnosis not present

## 2018-02-24 DIAGNOSIS — Z85048 Personal history of other malignant neoplasm of rectum, rectosigmoid junction, and anus: Secondary | ICD-10-CM | POA: Diagnosis not present

## 2018-02-24 DIAGNOSIS — I739 Peripheral vascular disease, unspecified: Secondary | ICD-10-CM | POA: Diagnosis not present

## 2018-02-24 DIAGNOSIS — Z933 Colostomy status: Secondary | ICD-10-CM | POA: Diagnosis not present

## 2018-02-24 DIAGNOSIS — Z86718 Personal history of other venous thrombosis and embolism: Secondary | ICD-10-CM | POA: Diagnosis not present

## 2018-02-24 DIAGNOSIS — I2510 Atherosclerotic heart disease of native coronary artery without angina pectoris: Secondary | ICD-10-CM | POA: Diagnosis not present

## 2018-02-24 DIAGNOSIS — Z7901 Long term (current) use of anticoagulants: Secondary | ICD-10-CM | POA: Diagnosis not present

## 2018-02-25 ENCOUNTER — Encounter (HOSPITAL_COMMUNITY): Payer: Self-pay | Admitting: Internal Medicine

## 2018-02-25 ENCOUNTER — Inpatient Hospital Stay (HOSPITAL_COMMUNITY): Payer: Medicare HMO | Attending: Hematology | Admitting: Internal Medicine

## 2018-02-25 VITALS — BP 156/97 | HR 87 | Temp 97.9°F | Resp 18 | Wt 165.1 lb

## 2018-02-25 DIAGNOSIS — R911 Solitary pulmonary nodule: Secondary | ICD-10-CM | POA: Diagnosis not present

## 2018-02-25 DIAGNOSIS — Z720 Tobacco use: Secondary | ICD-10-CM | POA: Insufficient documentation

## 2018-02-25 DIAGNOSIS — L89159 Pressure ulcer of sacral region, unspecified stage: Secondary | ICD-10-CM | POA: Diagnosis not present

## 2018-02-25 DIAGNOSIS — D751 Secondary polycythemia: Secondary | ICD-10-CM | POA: Diagnosis not present

## 2018-02-25 DIAGNOSIS — Z933 Colostomy status: Secondary | ICD-10-CM | POA: Diagnosis not present

## 2018-02-25 DIAGNOSIS — C20 Malignant neoplasm of rectum: Secondary | ICD-10-CM

## 2018-02-25 DIAGNOSIS — G894 Chronic pain syndrome: Secondary | ICD-10-CM | POA: Insufficient documentation

## 2018-02-25 MED ORDER — MORPHINE SULFATE ER 60 MG PO TBCR: 60.0000 mg | EXTENDED_RELEASE_TABLET | Freq: Three times a day (TID) | ORAL | 0 refills | Status: DC

## 2018-02-25 MED ORDER — OXYCODONE HCL 10 MG PO TABS: 10.0000 mg | ORAL_TABLET | ORAL | 0 refills | Status: DC | PRN
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
## 2018-02-25 NOTE — Patient Instructions (Signed)
[facility] Discharge Instructions  Today you saw Dr. Higgs.    Thank you for choosing [facility] to provide your oncology and hematology care.  To afford each patient quality time with our provider, please arrive at least 15 minutes before your scheduled appointment time.   If you have a lab appointment with the Cancer Center please come in thru the  Main Entrance and check in at the main information desk  You need to re-schedule your appointment should you arrive 10 or more minutes late.  We strive to give you quality time with our providers, and arriving late affects you and other patients whose appointments are after yours.  Also, if you no show three or more times for appointments you may be dismissed from the clinic at the providers discretion.     Again, thank you for choosing [facility].  Our hope is that these requests will decrease the amount of time that you wait before being seen by our physicians.       _____________________________________________________________  Should you have questions after your visit to  Cancer Center, please contact our office at [phone] between the hours of 8:30 a.m. and 4:30 p.m.  Voicemails left after 4:30 p.m. will not be returned until the following business day.  For prescription refill requests, have your pharmacy contact our office.       Resources For Cancer Patients and their Caregivers ? American Cancer Society: Can assist with transportation, wigs, general needs, runs Look Good Feel Better.        1-888-227-6333 ? Cancer Care: Provides financial assistance, online support groups, medication/co-pay assistance.  1-800-813-HOPE (4673) ? [facility] Assists [place] cancer patients and their families through emotional , educational and financial support.  336-427-4357 ? [place] DSS Where to apply for  food stamps, Medicaid and utility assistance. 336-342-1394 ? [facility]: Transportation to medical appointments. 336-347-2287 ? Social Security Administration: May apply for disability if have a Stage IV cancer. 336-342-7796 1-800-772-1213 ? [place] Aging, Disability and Transit Services: Assists with nutrition, care and transit needs. 336-349-2343  Cancer Center Support Programs:   > Cancer Support Group  2nd Tuesday of the month 1pm-2pm, Journey Room   > Creative Journey  3rd Tuesday of the month 1130am-1pm, Journey Room    

## 2018-02-28 DIAGNOSIS — Z933 Colostomy status: Secondary | ICD-10-CM | POA: Diagnosis not present

## 2018-02-28 DIAGNOSIS — I10 Essential (primary) hypertension: Secondary | ICD-10-CM | POA: Diagnosis not present

## 2018-02-28 DIAGNOSIS — I739 Peripheral vascular disease, unspecified: Secondary | ICD-10-CM | POA: Diagnosis not present

## 2018-02-28 DIAGNOSIS — Z85048 Personal history of other malignant neoplasm of rectum, rectosigmoid junction, and anus: Secondary | ICD-10-CM | POA: Diagnosis not present

## 2018-02-28 DIAGNOSIS — I2510 Atherosclerotic heart disease of native coronary artery without angina pectoris: Secondary | ICD-10-CM | POA: Diagnosis not present

## 2018-02-28 DIAGNOSIS — Z86718 Personal history of other venous thrombosis and embolism: Secondary | ICD-10-CM | POA: Diagnosis not present

## 2018-02-28 DIAGNOSIS — Z7901 Long term (current) use of anticoagulants: Secondary | ICD-10-CM | POA: Diagnosis not present

## 2018-02-28 DIAGNOSIS — Y842 Radiological procedure and radiotherapy as the cause of abnormal reaction of the patient, or of later complication, without mention of misadventure at the time of the procedure: Secondary | ICD-10-CM | POA: Diagnosis not present

## 2018-02-28 DIAGNOSIS — K605 Anorectal fistula: Secondary | ICD-10-CM | POA: Diagnosis not present

## 2018-03-02 DIAGNOSIS — I739 Peripheral vascular disease, unspecified: Secondary | ICD-10-CM | POA: Diagnosis not present

## 2018-03-02 DIAGNOSIS — Z85048 Personal history of other malignant neoplasm of rectum, rectosigmoid junction, and anus: Secondary | ICD-10-CM | POA: Diagnosis not present

## 2018-03-02 DIAGNOSIS — Z933 Colostomy status: Secondary | ICD-10-CM | POA: Diagnosis not present

## 2018-03-02 DIAGNOSIS — K605 Anorectal fistula: Secondary | ICD-10-CM | POA: Diagnosis not present

## 2018-03-02 DIAGNOSIS — Y842 Radiological procedure and radiotherapy as the cause of abnormal reaction of the patient, or of later complication, without mention of misadventure at the time of the procedure: Secondary | ICD-10-CM | POA: Diagnosis not present

## 2018-03-02 DIAGNOSIS — Z86718 Personal history of other venous thrombosis and embolism: Secondary | ICD-10-CM | POA: Diagnosis not present

## 2018-03-02 DIAGNOSIS — I2510 Atherosclerotic heart disease of native coronary artery without angina pectoris: Secondary | ICD-10-CM | POA: Diagnosis not present

## 2018-03-02 DIAGNOSIS — Z7901 Long term (current) use of anticoagulants: Secondary | ICD-10-CM | POA: Diagnosis not present

## 2018-03-02 DIAGNOSIS — I10 Essential (primary) hypertension: Secondary | ICD-10-CM | POA: Diagnosis not present

## 2018-03-07 DIAGNOSIS — I2510 Atherosclerotic heart disease of native coronary artery without angina pectoris: Secondary | ICD-10-CM | POA: Diagnosis not present

## 2018-03-07 DIAGNOSIS — Y842 Radiological procedure and radiotherapy as the cause of abnormal reaction of the patient, or of later complication, without mention of misadventure at the time of the procedure: Secondary | ICD-10-CM | POA: Diagnosis not present

## 2018-03-07 DIAGNOSIS — K605 Anorectal fistula: Secondary | ICD-10-CM | POA: Diagnosis not present

## 2018-03-07 DIAGNOSIS — I10 Essential (primary) hypertension: Secondary | ICD-10-CM | POA: Diagnosis not present

## 2018-03-07 DIAGNOSIS — I739 Peripheral vascular disease, unspecified: Secondary | ICD-10-CM | POA: Diagnosis not present

## 2018-03-07 DIAGNOSIS — Z85048 Personal history of other malignant neoplasm of rectum, rectosigmoid junction, and anus: Secondary | ICD-10-CM | POA: Diagnosis not present

## 2018-03-07 DIAGNOSIS — Z7901 Long term (current) use of anticoagulants: Secondary | ICD-10-CM | POA: Diagnosis not present

## 2018-03-07 DIAGNOSIS — Z933 Colostomy status: Secondary | ICD-10-CM | POA: Diagnosis not present

## 2018-03-07 DIAGNOSIS — Z86718 Personal history of other venous thrombosis and embolism: Secondary | ICD-10-CM | POA: Diagnosis not present

## 2018-03-09 DIAGNOSIS — Z85048 Personal history of other malignant neoplasm of rectum, rectosigmoid junction, and anus: Secondary | ICD-10-CM | POA: Diagnosis not present

## 2018-03-09 DIAGNOSIS — Z86718 Personal history of other venous thrombosis and embolism: Secondary | ICD-10-CM | POA: Diagnosis not present

## 2018-03-09 DIAGNOSIS — I10 Essential (primary) hypertension: Secondary | ICD-10-CM | POA: Diagnosis not present

## 2018-03-09 DIAGNOSIS — Y842 Radiological procedure and radiotherapy as the cause of abnormal reaction of the patient, or of later complication, without mention of misadventure at the time of the procedure: Secondary | ICD-10-CM | POA: Diagnosis not present

## 2018-03-09 DIAGNOSIS — I2510 Atherosclerotic heart disease of native coronary artery without angina pectoris: Secondary | ICD-10-CM | POA: Diagnosis not present

## 2018-03-09 DIAGNOSIS — K605 Anorectal fistula: Secondary | ICD-10-CM | POA: Diagnosis not present

## 2018-03-09 DIAGNOSIS — Z933 Colostomy status: Secondary | ICD-10-CM | POA: Diagnosis not present

## 2018-03-09 DIAGNOSIS — I739 Peripheral vascular disease, unspecified: Secondary | ICD-10-CM | POA: Diagnosis not present

## 2018-03-09 DIAGNOSIS — Z7901 Long term (current) use of anticoagulants: Secondary | ICD-10-CM | POA: Diagnosis not present

## 2018-03-10 DIAGNOSIS — K605 Anorectal fistula: Secondary | ICD-10-CM | POA: Diagnosis not present

## 2018-03-15 ENCOUNTER — Ambulatory Visit (HOSPITAL_COMMUNITY)
Admission: RE | Admit: 2018-03-15 | Discharge: 2018-03-15 | Disposition: A | Discharge status: Home / Self Care | Payer: Medicare HMO | Source: Ambulatory Visit | Attending: Internal Medicine | Admitting: Internal Medicine

## 2018-03-15 DIAGNOSIS — C3490 Malignant neoplasm of unspecified part of unspecified bronchus or lung: Secondary | ICD-10-CM | POA: Diagnosis not present

## 2018-03-15 DIAGNOSIS — I2510 Atherosclerotic heart disease of native coronary artery without angina pectoris: Secondary | ICD-10-CM | POA: Diagnosis not present

## 2018-03-15 DIAGNOSIS — C20 Malignant neoplasm of rectum: Secondary | ICD-10-CM

## 2018-03-15 DIAGNOSIS — Z720 Tobacco use: Secondary | ICD-10-CM | POA: Insufficient documentation

## 2018-03-15 DIAGNOSIS — G894 Chronic pain syndrome: Secondary | ICD-10-CM | POA: Diagnosis not present

## 2018-03-15 DIAGNOSIS — I700 Atherosclerosis of aorta: Secondary | ICD-10-CM | POA: Diagnosis not present

## 2018-03-15 DIAGNOSIS — J439 Emphysema, unspecified: Secondary | ICD-10-CM | POA: Diagnosis not present

## 2018-03-16 DIAGNOSIS — I2510 Atherosclerotic heart disease of native coronary artery without angina pectoris: Secondary | ICD-10-CM | POA: Diagnosis not present

## 2018-03-16 DIAGNOSIS — Y842 Radiological procedure and radiotherapy as the cause of abnormal reaction of the patient, or of later complication, without mention of misadventure at the time of the procedure: Secondary | ICD-10-CM | POA: Diagnosis not present

## 2018-03-16 DIAGNOSIS — I739 Peripheral vascular disease, unspecified: Secondary | ICD-10-CM | POA: Diagnosis not present

## 2018-03-16 DIAGNOSIS — Z86718 Personal history of other venous thrombosis and embolism: Secondary | ICD-10-CM | POA: Diagnosis not present

## 2018-03-16 DIAGNOSIS — K605 Anorectal fistula: Secondary | ICD-10-CM | POA: Diagnosis not present

## 2018-03-16 DIAGNOSIS — Z933 Colostomy status: Secondary | ICD-10-CM | POA: Diagnosis not present

## 2018-03-16 DIAGNOSIS — I10 Essential (primary) hypertension: Secondary | ICD-10-CM | POA: Diagnosis not present

## 2018-03-16 DIAGNOSIS — Z7901 Long term (current) use of anticoagulants: Secondary | ICD-10-CM | POA: Diagnosis not present

## 2018-03-16 DIAGNOSIS — Z85048 Personal history of other malignant neoplasm of rectum, rectosigmoid junction, and anus: Secondary | ICD-10-CM | POA: Diagnosis not present

## 2018-03-23 DIAGNOSIS — Y842 Radiological procedure and radiotherapy as the cause of abnormal reaction of the patient, or of later complication, without mention of misadventure at the time of the procedure: Secondary | ICD-10-CM | POA: Diagnosis not present

## 2018-03-23 DIAGNOSIS — Z85048 Personal history of other malignant neoplasm of rectum, rectosigmoid junction, and anus: Secondary | ICD-10-CM | POA: Diagnosis not present

## 2018-03-23 DIAGNOSIS — I739 Peripheral vascular disease, unspecified: Secondary | ICD-10-CM | POA: Diagnosis not present

## 2018-03-23 DIAGNOSIS — Z86718 Personal history of other venous thrombosis and embolism: Secondary | ICD-10-CM | POA: Diagnosis not present

## 2018-03-23 DIAGNOSIS — I2510 Atherosclerotic heart disease of native coronary artery without angina pectoris: Secondary | ICD-10-CM | POA: Diagnosis not present

## 2018-03-23 DIAGNOSIS — K605 Anorectal fistula: Secondary | ICD-10-CM | POA: Diagnosis not present

## 2018-03-23 DIAGNOSIS — Z7901 Long term (current) use of anticoagulants: Secondary | ICD-10-CM | POA: Diagnosis not present

## 2018-03-23 DIAGNOSIS — Z933 Colostomy status: Secondary | ICD-10-CM | POA: Diagnosis not present

## 2018-03-23 DIAGNOSIS — I10 Essential (primary) hypertension: Secondary | ICD-10-CM | POA: Diagnosis not present

## 2018-03-25 DIAGNOSIS — K603 Anal fistula: Secondary | ICD-10-CM | POA: Diagnosis not present

## 2018-03-25 DIAGNOSIS — Z9689 Presence of other specified functional implants: Secondary | ICD-10-CM | POA: Diagnosis not present

## 2018-03-25 DIAGNOSIS — Z9889 Other specified postprocedural states: Secondary | ICD-10-CM | POA: Diagnosis not present

## 2018-03-28 DIAGNOSIS — Z85048 Personal history of other malignant neoplasm of rectum, rectosigmoid junction, and anus: Secondary | ICD-10-CM | POA: Diagnosis not present

## 2018-03-28 DIAGNOSIS — Z7901 Long term (current) use of anticoagulants: Secondary | ICD-10-CM | POA: Diagnosis not present

## 2018-03-28 DIAGNOSIS — Y842 Radiological procedure and radiotherapy as the cause of abnormal reaction of the patient, or of later complication, without mention of misadventure at the time of the procedure: Secondary | ICD-10-CM | POA: Diagnosis not present

## 2018-03-28 DIAGNOSIS — I2510 Atherosclerotic heart disease of native coronary artery without angina pectoris: Secondary | ICD-10-CM | POA: Diagnosis not present

## 2018-03-28 DIAGNOSIS — Z933 Colostomy status: Secondary | ICD-10-CM | POA: Diagnosis not present

## 2018-03-28 DIAGNOSIS — I10 Essential (primary) hypertension: Secondary | ICD-10-CM | POA: Diagnosis not present

## 2018-03-28 DIAGNOSIS — K605 Anorectal fistula: Secondary | ICD-10-CM | POA: Diagnosis not present

## 2018-03-28 DIAGNOSIS — Z86718 Personal history of other venous thrombosis and embolism: Secondary | ICD-10-CM | POA: Diagnosis not present

## 2018-03-28 DIAGNOSIS — I739 Peripheral vascular disease, unspecified: Secondary | ICD-10-CM | POA: Diagnosis not present

## 2018-03-30 DIAGNOSIS — Z933 Colostomy status: Secondary | ICD-10-CM | POA: Diagnosis not present

## 2018-03-30 DIAGNOSIS — L89159 Pressure ulcer of sacral region, unspecified stage: Secondary | ICD-10-CM | POA: Diagnosis not present

## 2018-04-07 ENCOUNTER — Telehealth (HOSPITAL_COMMUNITY): Payer: Self-pay | Admitting: *Deleted

## 2018-04-08 DIAGNOSIS — R52 Pain, unspecified: Secondary | ICD-10-CM | POA: Diagnosis not present

## 2018-04-08 DIAGNOSIS — K9400 Colostomy complication, unspecified: Secondary | ICD-10-CM | POA: Diagnosis not present

## 2018-04-08 DIAGNOSIS — M25152 Fistula, left hip: Secondary | ICD-10-CM | POA: Diagnosis not present

## 2018-04-08 NOTE — Telephone Encounter (Signed)
Reviewed with Dr. Walden Field. She will not refill patients pain medicine and suggested again he go to his PCP or surgeon. Dr. Walden Field has reviewed this with patient. (see her last note). Called and notified patient that he will not be able to get refill of pain meds from [facility]. Encouraged him to call the surgeon or his PCP. If pain gets too bad to go to ER. Patient verbalized understanding and states his PCP is trying to find him a pain clinic to go to.

## 2018-04-20 DIAGNOSIS — Z933 Colostomy status: Secondary | ICD-10-CM | POA: Diagnosis not present

## 2018-06-03 DIAGNOSIS — Z933 Colostomy status: Secondary | ICD-10-CM | POA: Diagnosis not present

## 2018-07-11 DIAGNOSIS — Z933 Colostomy status: Secondary | ICD-10-CM | POA: Diagnosis not present

## 2018-08-04 DIAGNOSIS — K603 Anal fistula: Secondary | ICD-10-CM | POA: Diagnosis not present

## 2018-08-04 DIAGNOSIS — K9409 Other complications of colostomy: Secondary | ICD-10-CM | POA: Diagnosis not present

## 2018-08-04 DIAGNOSIS — Z85038 Personal history of other malignant neoplasm of large intestine: Secondary | ICD-10-CM | POA: Diagnosis not present

## 2018-08-22 DIAGNOSIS — F17210 Nicotine dependence, cigarettes, uncomplicated: Secondary | ICD-10-CM | POA: Diagnosis not present

## 2018-08-22 DIAGNOSIS — I10 Essential (primary) hypertension: Secondary | ICD-10-CM | POA: Diagnosis not present

## 2018-08-22 DIAGNOSIS — Z933 Colostomy status: Secondary | ICD-10-CM | POA: Diagnosis not present

## 2018-08-22 DIAGNOSIS — Z1331 Encounter for screening for depression: Secondary | ICD-10-CM | POA: Diagnosis not present

## 2018-08-22 DIAGNOSIS — I2510 Atherosclerotic heart disease of native coronary artery without angina pectoris: Secondary | ICD-10-CM | POA: Diagnosis not present

## 2018-08-22 DIAGNOSIS — Z23 Encounter for immunization: Secondary | ICD-10-CM | POA: Diagnosis not present

## 2018-08-22 DIAGNOSIS — Z0001 Encounter for general adult medical examination with abnormal findings: Secondary | ICD-10-CM | POA: Diagnosis not present

## 2018-08-22 DIAGNOSIS — Z0000 Encounter for general adult medical examination without abnormal findings: Secondary | ICD-10-CM | POA: Diagnosis not present

## 2018-08-22 DIAGNOSIS — Z1389 Encounter for screening for other disorder: Secondary | ICD-10-CM | POA: Diagnosis not present

## 2018-09-27 DIAGNOSIS — Z933 Colostomy status: Secondary | ICD-10-CM | POA: Diagnosis not present

## 2018-09-29 DIAGNOSIS — K219 Gastro-esophageal reflux disease without esophagitis: Secondary | ICD-10-CM | POA: Diagnosis not present

## 2018-09-29 DIAGNOSIS — E785 Hyperlipidemia, unspecified: Secondary | ICD-10-CM | POA: Diagnosis not present

## 2018-09-29 DIAGNOSIS — I2510 Atherosclerotic heart disease of native coronary artery without angina pectoris: Secondary | ICD-10-CM | POA: Diagnosis not present

## 2018-09-29 DIAGNOSIS — I480 Paroxysmal atrial fibrillation: Secondary | ICD-10-CM | POA: Diagnosis not present

## 2018-09-29 DIAGNOSIS — Z85048 Personal history of other malignant neoplasm of rectum, rectosigmoid junction, and anus: Secondary | ICD-10-CM | POA: Diagnosis not present

## 2018-09-29 DIAGNOSIS — I110 Hypertensive heart disease with heart failure: Secondary | ICD-10-CM | POA: Diagnosis not present

## 2018-09-29 DIAGNOSIS — I509 Heart failure, unspecified: Secondary | ICD-10-CM | POA: Diagnosis not present

## 2018-09-29 DIAGNOSIS — Z7901 Long term (current) use of anticoagulants: Secondary | ICD-10-CM | POA: Diagnosis not present

## 2018-09-29 DIAGNOSIS — K9409 Other complications of colostomy: Secondary | ICD-10-CM | POA: Diagnosis not present

## 2018-10-05 DIAGNOSIS — I2510 Atherosclerotic heart disease of native coronary artery without angina pectoris: Secondary | ICD-10-CM | POA: Diagnosis not present

## 2018-10-05 DIAGNOSIS — K9419 Other complications of enterostomy: Secondary | ICD-10-CM | POA: Diagnosis not present

## 2018-10-05 DIAGNOSIS — K9409 Other complications of colostomy: Secondary | ICD-10-CM | POA: Diagnosis not present

## 2018-10-05 DIAGNOSIS — E785 Hyperlipidemia, unspecified: Secondary | ICD-10-CM | POA: Diagnosis not present

## 2018-10-05 DIAGNOSIS — Z7901 Long term (current) use of anticoagulants: Secondary | ICD-10-CM | POA: Diagnosis not present

## 2018-10-05 DIAGNOSIS — I110 Hypertensive heart disease with heart failure: Secondary | ICD-10-CM | POA: Diagnosis not present

## 2018-10-05 DIAGNOSIS — I480 Paroxysmal atrial fibrillation: Secondary | ICD-10-CM | POA: Diagnosis not present

## 2018-10-05 DIAGNOSIS — Z85048 Personal history of other malignant neoplasm of rectum, rectosigmoid junction, and anus: Secondary | ICD-10-CM | POA: Diagnosis not present

## 2018-10-05 DIAGNOSIS — I509 Heart failure, unspecified: Secondary | ICD-10-CM | POA: Diagnosis not present

## 2018-10-05 DIAGNOSIS — K219 Gastro-esophageal reflux disease without esophagitis: Secondary | ICD-10-CM | POA: Diagnosis not present

## 2018-10-27 DIAGNOSIS — Z433 Encounter for attention to colostomy: Secondary | ICD-10-CM | POA: Diagnosis not present

## 2018-11-03 ENCOUNTER — Encounter: Payer: Self-pay | Admitting: Cardiovascular Disease

## 2018-11-03 ENCOUNTER — Ambulatory Visit (INDEPENDENT_AMBULATORY_CARE_PROVIDER_SITE_OTHER): Payer: Medicare HMO | Admitting: Cardiovascular Disease

## 2018-11-03 VITALS — BP 124/82 | HR 99 | Ht 69.0 in | Wt 149.0 lb

## 2018-11-03 DIAGNOSIS — Z720 Tobacco use: Secondary | ICD-10-CM

## 2018-11-03 DIAGNOSIS — R079 Chest pain, unspecified: Secondary | ICD-10-CM

## 2018-11-03 DIAGNOSIS — I10 Essential (primary) hypertension: Secondary | ICD-10-CM | POA: Diagnosis not present

## 2018-11-03 DIAGNOSIS — I739 Peripheral vascular disease, unspecified: Secondary | ICD-10-CM | POA: Diagnosis not present

## 2018-11-03 DIAGNOSIS — Z955 Presence of coronary angioplasty implant and graft: Secondary | ICD-10-CM | POA: Diagnosis not present

## 2018-11-03 DIAGNOSIS — I25118 Atherosclerotic heart disease of native coronary artery with other forms of angina pectoris: Secondary | ICD-10-CM

## 2018-11-03 DIAGNOSIS — E785 Hyperlipidemia, unspecified: Secondary | ICD-10-CM

## 2018-11-03 NOTE — Patient Instructions (Addendum)
Medication Instructions:  Your physician recommends that you continue on your current medications as directed. Please refer to the Current Medication list given to you today.  If you need a refill on your cardiac medications before your next appointment, please call your pharmacy.   Lab work: None today If you have labs (blood work) drawn today and your tests are completely normal, you will receive your results only by: Marland Kitchen MyChart Message (if you have MyChart) OR . A paper copy in the mail If you have any lab test that is abnormal or we need to change your treatment, we will call you to review the results.  Testing/Procedures: Your physician has requested that you have a lexiscan myoview. For further information please visit [email or web address]. Please follow instruction sheet, as given.  Your physician has requested that you have an ankle brachial index (ABI). During this test an ultrasound and blood pressure cuff are used to evaluate the arteries that supply legs with blood. Allow thirty minutes for this exam. There are no restrictions or special instructions.  You have been referred to Vascular Surgery,they will call you for an apt   Follow-Up: At [facility], you and your health needs are our priority.  As part of our continuing mission to provide you with exceptional heart care, we have created designated Provider Care Teams.  These Care Teams include your primary Cardiologist (physician) and Advanced Practice Providers (APPs -  Physician Assistants and Nurse Practitioners) who all work together to provide you with the care you need, when you need it. You will need a follow up appointment in 3 months.  Please call our office 2 months in advance to schedule this appointment.  You may see Kate Sable, MD or one of the following Advanced Practice Providers on your designated Care Team:   Bernerd Pho, [name] [facility]) . Ermalinda Barrios, [name] ([place])  Any  Other Special Instructions Will Be Listed Below (If Applicable).None

## 2018-11-03 NOTE — Progress Notes (Addendum)
SUBJECTIVE: The patient presents for past due follow-up.  I last evaluated him in March 2017. He has a history of drug-eluting stent placement to the left circumflex coronary artery in July 2014 with moderate to severe obstructive disease in the distal LAD and mild disease in proximal LAD and right coronary artery.  He also has hypertension, hyperlipidemia, history of tobacco abuse, stage IIa rectal adenocarcinoma, and peripheral vascular disease.  He underwent left femoral to above-the-knee popliteal bypass for claudication on 07/02/15.  Prior to doing so, he underwent a low risk nuclear stress test on 06/18/15 with no perfusion defects, calculated LVEF 54%.  Most recent echocardiogram on 06/07/2016 showed normal left ventricular systolic function, LVEF 50 to 55%, and mild biatrial dilatation.  He underwent a colostomy revision at [facility] in January 2020.  He has chest pains about once or twice per week which spontaneously resolve lasting seconds.  He has not had use nitroglycerin in over a year.  He does mention markedly diminished energy levels over the past several months.  He also complains of left hip and left leg pain with walking with some associated weakness.  He smokes about 1/2 pack of cigarettes daily.  He has not seen vascular surgery in years.  ECG performed in the office today which I ordered and personally interpreted demonstrates sinus tachycardia.    Review of Systems: As per "subjective", otherwise negative.  Allergies  Allergen Reactions  . Darvocet [Propoxyphene N-Acetaminophen] Palpitations  . Propoxyphene Palpitations    Current Outpatient Medications  Medication Sig Dispense Refill  . amLODipine (NORVASC) 5 MG tablet Take 5 mg by mouth daily.    Marland Kitchen apixaban (ELIQUIS) 5 MG TABS tablet Take 1 tablet (5 mg total) by mouth 2 (two) times daily.    Marland Kitchen aspirin EC 81 MG tablet Take 81 mg by mouth daily.    . isosorbide mononitrate  (IMDUR) 30 MG 24 hr tablet TAKE 1/2 TABLET BY MOUTH ONCE DAILY. (Patient taking differently: Take 15 mg by mouth daily. ) 15 tablet 3  . lisinopril (PRINIVIL,ZESTRIL) 40 MG tablet Take 40 mg by mouth daily.    Marland Kitchen NITROSTAT 0.4 MG SL tablet PLACE 1 TABLET UNDER THE TONGUE AS NEEDED FOR CHEST PAIN UP TO 3 DOSES 25 tablet 3  . omeprazole (PRILOSEC) 40 MG capsule Take 40 mg by mouth daily.     No current facility-administered medications for this visit.     Past Medical History:  Diagnosis Date  . Chronic lower back pain    a. Followed by pain management at [facility].  . Colon cancer [facility])    rectal cancer  . Coronary artery disease    a. 03/2013: abnl nuc -> LHC s/p DES to LCx, residual moderate disease in LAD (med rx unless refractory angina). b. Not on BB due to bradycardia.  Marland Kitchen DVT (deep venous thrombosis) ([place]) ~ 2013  . Dysrhythmia    AFib  . GERD (gastroesophageal reflux disease)   . H/O necrotising fasciitis   . History of blood transfusion    "once; after throwing up alot of blood" (04/17/2013)  . Hypertension   . LV dysfunction    a. EF 45% in 03/2013.  Marland Kitchen PAD (peripheral artery disease) ([place])    a. Occlusion of the right internal iliac artery, with significant atherosclerosis in the left internal iliac which was not amenable to reconstruction per notes from [facility].  . Pulmonary nodules 09/30/2014  . Rectal cancer (  [place])   . Tobacco abuse     Past Surgical History:  Procedure Laterality Date  . ABDOMINAL SURGERY  1990's   'for stomach ulcers" (04/17/2013)  . COLECTOMY  2012   "for rectal cancer" (04/17/2013)  . COLONOSCOPY  2013   Dr. Cheryll Cockayne: colorectal anastomosis with ulcer and inflammation, benign biopsy  . COLONOSCOPY WITH PROPOFOL N/A 09/07/2016   Procedure: COLONOSCOPY WITH PROPOFOL;  Surgeon: Daneil Dolin, MD;  [facility];  Service: Endoscopy;  Laterality: N/A;  10:00 am Colonoscopy via rectum and ostomy  . COLOSTOMY TAKEDOWN  2013  .  CORONARY ANGIOPLASTY WITH STENT PLACEMENT  04/17/2013   "?1" (04/17/2013)  . FEMORAL-POPLITEAL BYPASS GRAFT Left 07/02/2015   Procedure: BYPASS GRAFT LEFT COMMON FEMORAL ARTERY TO LEFT ABOVE KNEE POPLITEAL ARTERY - USING LEFT GREATER SAPPHENOUS VEIN;  Surgeon: Elam Dutch, MD;  Location: [place];  Service: Vascular;  Laterality: Left;  . INCISION AND DRAINAGE ABSCESS Left 06/05/2016   Procedure: INCISION AND DRAINAGE ABSCESS;  Surgeon: Aviva Signs, MD;  Location: [facility];  Service: General;  Laterality: Left;  . INCISION AND DRAINAGE PERIRECTAL ABSCESS Left 06/07/2016   Procedure: IRRIGATION AND DEBRIDEMENT LEFT BUTTOCK ABSCESS;  Surgeon: Greer Pickerel, MD;  Location: [place];  Service: General;  Laterality: Left;  . INGUINAL HERNIA REPAIR Bilateral 1990's  . LAPAROSCOPIC PARTIAL COLECTOMY N/A 06/11/2016   Procedure: LAPAROSCOPIC  OPEN COLOSTOMY;  Surgeon: [name], MD;  Location: [place];  Service: General;  Laterality: N/A;  . LEFT HEART CATHETERIZATION WITH CORONARY ANGIOGRAM N/A 04/17/2013   Procedure: LEFT HEART CATHETERIZATION WITH CORONARY ANGIOGRAM;  Surgeon: Peter M Martinique, MD;  Location: [facility] CATH LAB;  Service: Cardiovascular;  Laterality: N/A;  . PERCUTANEOUS STENT INTERVENTION  04/17/2013   Procedure: PERCUTANEOUS STENT INTERVENTION;  Surgeon: Peter M Martinique, MD;  Location: [facility] CATH LAB;  Service: Cardiovascular;;  . PERIPHERAL VASCULAR CATHETERIZATION N/A 06/14/2015   Procedure: Abdominal Aortogram;  Surgeon: Elam Dutch, MD;  Location: [place] CV LAB;  Service: Cardiovascular;  Laterality: N/A;  . PERIPHERAL VASCULAR CATHETERIZATION Bilateral 06/14/2015   Procedure: Lower Extremity Angiography;  Surgeon: Elam Dutch, MD;  Location: [place] CV LAB;  Service: Cardiovascular;  Laterality: Bilateral;  . VEIN HARVEST Left 07/02/2015   Procedure: VEIN HARVEST - LEFT GREATER SAPPHENOUS VEIN;  Surgeon: Elam Dutch, MD;  Location: [place];  Service: Vascular;  Laterality: Left;      Social History   Socioeconomic History  . Marital status: Married    Spouse name: Not on file  . Number of children: Not on file  . Years of education: Not on file  . Highest education level: Not on file  Occupational History  . Not on file  Social Needs  . Financial resource strain: Not on file  . Food insecurity:    Worry: Not on file    Inability: Not on file  . Transportation needs:    Medical: Not on file    Non-medical: Not on file  Tobacco Use  . Smoking status: Light Tobacco Smoker    Packs/day: 0.25    Years: 40.00    Pack years: 10.00    Types: Cigarettes    Start date: 03/14/1974  . Smokeless tobacco: Never Used  . Tobacco comment: 5-6 per day 06/12/15  Substance and Sexual Activity  . Alcohol use: Yes    Alcohol/week: 3.0 standard drinks    Types: 3 Cans of beer per week  . Drug use:  Yes    Types: Marijuana    Comment: 2 days ago   . Sexual activity: Yes    Partners: Male    Birth control/protection: None  Lifestyle  . Physical activity:    Days per week: Not on file    Minutes per session: Not on file  . Stress: Not on file  Relationships  . Social connections:    Talks on phone: Not on file    Gets together: Not on file    Attends religious service: Not on file    Active member of club or organization: Not on file    Attends meetings of clubs or organizations: Not on file    Relationship status: Not on file  . Intimate partner violence:    Fear of current or ex partner: Not on file    Emotionally abused: Not on file    Physically abused: Not on file    Forced sexual activity: Not on file  Other Topics Concern  . Not on file  Social History Narrative  . Not on file     Vitals:   11/03/18 0944  BP: 124/82  Pulse: 99  SpO2: 94%  Weight: 149 lb (67.6 kg)  Height: 5\' 9"  (1.753 m)    Wt Readings from Last 3 Encounters:  11/03/18 149 lb (67.6 kg)  02/25/18 165 lb 2 oz (74.9 kg)  01/20/18 168 lb (76.2 kg)     PHYSICAL  EXAM General: NAD HEENT: Normal. Neck: No JVD, no thyromegaly. Lungs: Diminished sounds throughout, no crackles or wheezes. CV: Heart rate at upper normal limits, regular rhythm, normal S1/S2, no S3/S4, no murmur. No pretibial or periankle edema.  No carotid bruit.   Abdomen: Soft, nontender, no distention.  Neurologic: Alert and oriented.  Psych: Normal affect. Skin: Normal. Musculoskeletal: No gross deformities.    ECG: Reviewed above under Subjective   Labs: Lab Results  Component Value Date/Time   K 4.0 01/20/2018 12:35 PM   BUN 13 01/20/2018 12:35 PM   CREATININE 1.36 (H) 01/20/2018 12:35 PM   CREATININE 1.10 04/14/2013 04:17 PM   ALT 17 01/20/2018 12:35 PM   TSH 2.467 06/09/2016 10:38 AM   TSH 1.083 03/29/2013 05:16 AM   HGB 18.4 (H) 01/20/2018 12:35 PM     Lipids: Lab Results  Component Value Date/Time   LDLCALC 96 03/29/2013 05:17 AM   CHOL 161 03/29/2013 05:17 AM   TRIG 144 06/10/2016 06:26 AM   HDL 45 03/29/2013 05:17 AM       ASSESSMENT AND PLAN: 1.  Coronary artery disease:  He has a history of drug-eluting stent placement to the left circumflex coronary artery in July 2014 with moderate to severe obstructive disease in the distal LAD and mild disease in proximal LAD and right coronary artery.  Currently on aspirin, atorvastatin, and Imdur.  He does have episodic chest pain and exertional fatigue.  I will obtain a Lexiscan Myoview.  I will obtain a copy of lipids from PCP.  2.  Hypertension: Blood pressure is normal.  No changes.  3.  Hyperlipidemia: I will obtain a copy of lipids from PCP.  He takes atorvastatin.  We will contact his pharmacy to verify the dose.  4.  Tobacco abuse: Extensive cessation counseling previously provided.  I doubt his willingness to quit.  He smokes 1/2 pack of cigarettes daily.  5.  Peripheral vascular disease with claudication: Underwent left femoral to above-the-knee popliteal bypass for claudication on 07/02/15. He is on  aspirin  and atorvastatin.  Tobacco cessation is paramount.  He should follow with vascular surgery.  I will make a referral.  He has left leg pain and weakness.  I will obtain ABIs.    Disposition: Follow up 3 months  Time spent: 40 minutes, of which greater than 50% was spent reviewing symptoms, relevant blood tests and studies, and discussing management plan with the patient.    Kate Sable, M.D., F.A.C.C.

## 2018-11-14 ENCOUNTER — Ambulatory Visit (HOSPITAL_COMMUNITY)
Admission: RE | Admit: 2018-11-14 | Discharge: 2018-11-14 | Disposition: A | Discharge status: Home / Self Care | Payer: Medicare HMO | Source: Ambulatory Visit | Attending: Cardiovascular Disease | Admitting: Cardiovascular Disease

## 2018-11-14 ENCOUNTER — Encounter (HOSPITAL_BASED_OUTPATIENT_CLINIC_OR_DEPARTMENT_OTHER)
Admission: RE | Admit: 2018-11-14 | Discharge: 2018-11-14 | Disposition: A | Discharge status: Home / Self Care | Payer: Medicare HMO | Source: Ambulatory Visit | Attending: Cardiovascular Disease | Admitting: Cardiovascular Disease

## 2018-11-14 ENCOUNTER — Telehealth: Payer: Self-pay

## 2018-11-14 DIAGNOSIS — R079 Chest pain, unspecified: Secondary | ICD-10-CM

## 2018-11-14 DIAGNOSIS — I739 Peripheral vascular disease, unspecified: Secondary | ICD-10-CM | POA: Diagnosis not present

## 2018-11-14 DIAGNOSIS — I255 Ischemic cardiomyopathy: Secondary | ICD-10-CM

## 2018-11-14 LAB — NM MYOCAR MULTI W/SPECT W/WALL MOTION / EF
LV dias vol: 77 mL (ref 62–150)
LV sys vol: 56 mL
Peak HR: 106 {beats}/min
RATE: 0.35
Rest HR: 69 {beats}/min
SDS: 0
SRS: 1
SSS: 1
TID: 1.06

## 2018-11-14 MED ORDER — TECHNETIUM TC 99M TETROFOSMIN IV KIT
30.0000 | PACK | Freq: Once | INTRAVENOUS | Status: AC | PRN
  Administered 2018-11-14: 30.5 via INTRAVENOUS

## 2018-11-14 MED ORDER — SODIUM CHLORIDE 0.9% FLUSH
INTRAVENOUS | Status: AC
  Administered 2018-11-14: 10 mL via INTRAVENOUS
  Filled 2018-11-14: qty 10

## 2018-11-14 MED ORDER — REGADENOSON 0.4 MG/5ML IV SOLN
INTRAVENOUS | Status: AC
  Administered 2018-11-14: 0.4 mg via INTRAVENOUS
  Filled 2018-11-14: qty 5

## 2018-11-14 MED ORDER — TECHNETIUM TC 99M TETROFOSMIN IV KIT
10.0000 | PACK | Freq: Once | INTRAVENOUS | Status: AC | PRN
  Administered 2018-11-14: 10.08 via INTRAVENOUS

## 2018-11-14 NOTE — Telephone Encounter (Signed)
-----   Message from Imogene Burn, [name] sent at 11/14/2018  2:58 PM EST ----- I am covering Dr. Court Joy inbox.  Stress Myoview showed no ischemia but does show his heart function is down a little from prior stress test and echo.  Please order a 2D echo for LV function under Dr. Court Joy name for him to review.

## 2018-11-14 NOTE — Telephone Encounter (Signed)
Spoke with patient, gave nuc results, will go for echo Wed 2/19 at 12:45 pm, copied pcp

## 2018-11-16 ENCOUNTER — Ambulatory Visit (HOSPITAL_COMMUNITY)
Admission: RE | Admit: 2018-11-16 | Discharge: 2018-11-16 | Disposition: A | Discharge status: Home / Self Care | Payer: Medicare HMO | Source: Ambulatory Visit | Attending: Cardiovascular Disease | Admitting: Cardiovascular Disease

## 2018-11-16 DIAGNOSIS — I255 Ischemic cardiomyopathy: Secondary | ICD-10-CM | POA: Diagnosis not present

## 2018-11-16 DIAGNOSIS — Z933 Colostomy status: Secondary | ICD-10-CM | POA: Diagnosis not present

## 2018-11-16 NOTE — Progress Notes (Signed)
*  PRELIMINARY RESULTS* Echocardiogram 2D Echocardiogram has been performed.  Leavy Cella 11/16/2018, 1:57 PM

## 2018-11-21 DIAGNOSIS — Z933 Colostomy status: Secondary | ICD-10-CM | POA: Diagnosis not present

## 2018-11-22 ENCOUNTER — Other Ambulatory Visit (HOSPITAL_COMMUNITY): Payer: Self-pay | Admitting: Internal Medicine

## 2018-11-22 ENCOUNTER — Ambulatory Visit (HOSPITAL_COMMUNITY)
Admission: RE | Admit: 2018-11-22 | Discharge: 2018-11-22 | Disposition: A | Discharge status: Home / Self Care | Payer: Medicare HMO | Source: Ambulatory Visit | Attending: Internal Medicine | Admitting: Internal Medicine

## 2018-11-22 DIAGNOSIS — I10 Essential (primary) hypertension: Secondary | ICD-10-CM | POA: Diagnosis not present

## 2018-11-22 DIAGNOSIS — M25552 Pain in left hip: Secondary | ICD-10-CM

## 2018-11-22 DIAGNOSIS — I2510 Atherosclerotic heart disease of native coronary artery without angina pectoris: Secondary | ICD-10-CM | POA: Diagnosis not present

## 2018-11-22 DIAGNOSIS — Z933 Colostomy status: Secondary | ICD-10-CM | POA: Diagnosis not present

## 2018-11-22 DIAGNOSIS — M160 Bilateral primary osteoarthritis of hip: Secondary | ICD-10-CM | POA: Diagnosis not present

## 2018-11-28 ENCOUNTER — Telehealth: Payer: Self-pay

## 2018-11-28 ENCOUNTER — Other Ambulatory Visit (HOSPITAL_COMMUNITY)
Admission: RE | Admit: 2018-11-28 | Discharge: 2018-11-28 | Disposition: A | Discharge status: Home / Self Care | Payer: Medicare HMO | Source: Ambulatory Visit | Attending: Cardiovascular Disease | Admitting: Cardiovascular Disease

## 2018-11-28 DIAGNOSIS — E875 Hyperkalemia: Secondary | ICD-10-CM | POA: Insufficient documentation

## 2018-11-28 LAB — BASIC METABOLIC PANEL
Anion gap: 8 (ref 5–15)
BUN: 8 mg/dL (ref 6–20)
CO2: 25 mmol/L (ref 22–32)
Calcium: 8.8 mg/dL — ABNORMAL LOW (ref 8.9–10.3)
Chloride: 107 mmol/L (ref 98–111)
Creatinine, Ser: 1 mg/dL (ref 0.61–1.24)
GFR calc Af Amer: >60 mL/min (ref >60)
GFR calc non Af Amer: >60 mL/min (ref >60)
Glucose, Bld: 109 mg/dL — ABNORMAL HIGH (ref 70–99)
Potassium: 3.9 mmol/L (ref 3.5–5.1)
Sodium: 140 mmol/L (ref 135–145)

## 2018-11-28 NOTE — Telephone Encounter (Signed)
Labs received from Dr.[name] , done 08/22/18 show K+ of 6.5  Dr.Koneswaran requests repeat BMET, pt will come today

## 2018-12-14 ENCOUNTER — Ambulatory Visit: Payer: Medicare HMO | Admitting: Orthopaedic Surgery

## 2018-12-15 ENCOUNTER — Encounter: Payer: Self-pay | Admitting: Orthopaedic Surgery

## 2018-12-26 ENCOUNTER — Telehealth: Payer: Self-pay | Admitting: Orthopaedic Surgery

## 2018-12-26 NOTE — Telephone Encounter (Signed)
Patient was scheduled to see Dr. Luna Glasgow on 12/14/18.  He was a no show for this appointment.  We called him several times but could not leave a message.  We have sent a letter asking pt to call the office to reschedule the appointment.

## 2018-12-29 ENCOUNTER — Other Ambulatory Visit (HOSPITAL_COMMUNITY): Payer: Medicare HMO

## 2018-12-29 ENCOUNTER — Ambulatory Visit: Payer: Medicare HMO | Admitting: Vascular Surgery

## 2019-02-08 ENCOUNTER — Ambulatory Visit: Payer: Medicare HMO | Admitting: Cardiovascular Disease

## 2019-02-24 ENCOUNTER — Other Ambulatory Visit (HOSPITAL_COMMUNITY): Payer: Self-pay | Admitting: *Deleted

## 2019-02-24 DIAGNOSIS — D751 Secondary polycythemia: Secondary | ICD-10-CM

## 2019-02-24 DIAGNOSIS — C20 Malignant neoplasm of rectum: Secondary | ICD-10-CM

## 2019-02-27 ENCOUNTER — Other Ambulatory Visit: Payer: Self-pay

## 2019-02-27 ENCOUNTER — Inpatient Hospital Stay (HOSPITAL_COMMUNITY): Payer: Medicare HMO | Attending: Hematology

## 2019-02-27 DIAGNOSIS — Z720 Tobacco use: Secondary | ICD-10-CM | POA: Diagnosis not present

## 2019-02-27 DIAGNOSIS — D751 Secondary polycythemia: Secondary | ICD-10-CM

## 2019-02-27 DIAGNOSIS — C20 Malignant neoplasm of rectum: Secondary | ICD-10-CM | POA: Diagnosis not present

## 2019-02-27 DIAGNOSIS — N189 Chronic kidney disease, unspecified: Secondary | ICD-10-CM | POA: Insufficient documentation

## 2019-02-27 LAB — COMPREHENSIVE METABOLIC PANEL
ALT: 16 U/L (ref 0–44)
AST: 17 U/L (ref 15–41)
Albumin: 3.8 g/dL (ref 3.5–5.0)
Alkaline Phosphatase: 76 U/L (ref 38–126)
Anion gap: 11 (ref 5–15)
BUN: 15 mg/dL (ref 6–20)
CO2: 21 mmol/L — ABNORMAL LOW (ref 22–32)
Calcium: 9 mg/dL (ref 8.9–10.3)
Chloride: 106 mmol/L (ref 98–111)
Creatinine, Ser: 1.45 mg/dL — ABNORMAL HIGH (ref 0.61–1.24)
GFR calc Af Amer: >60 mL/min (ref >60)
GFR calc non Af Amer: 53 mL/min — ABNORMAL LOW (ref >60)
Glucose, Bld: 123 mg/dL — ABNORMAL HIGH (ref 70–99)
Potassium: 4.2 mmol/L (ref 3.5–5.1)
Sodium: 138 mmol/L (ref 135–145)
Total Bilirubin: 0.5 mg/dL (ref 0.3–1.2)
Total Protein: 7.9 g/dL (ref 6.5–8.1)

## 2019-02-27 LAB — CBC WITH DIFFERENTIAL/PLATELET
Abs Immature Granulocytes: 0.02 10*3/uL (ref 0.00–0.07)
Basophils Absolute: 0.1 10*3/uL (ref 0.0–0.1)
Basophils Relative: 1 %
Eosinophils Absolute: 0.1 10*3/uL (ref 0.0–0.5)
Eosinophils Relative: 1 %
HCT: 52.7 % — ABNORMAL HIGH (ref 39.0–52.0)
Hemoglobin: 17.9 g/dL — ABNORMAL HIGH (ref 13.0–17.0)
Immature Granulocytes: 0 %
Lymphocytes Relative: 20 %
Lymphs Abs: 1.8 10*3/uL (ref 0.7–4.0)
MCH: 32.5 pg (ref 26.0–34.0)
MCHC: 34 g/dL (ref 30.0–36.0)
MCV: 95.8 fL (ref 80.0–100.0)
Monocytes Absolute: 0.7 10*3/uL (ref 0.1–1.0)
Monocytes Relative: 7 %
Neutro Abs: 6.4 10*3/uL (ref 1.7–7.7)
Neutrophils Relative %: 71 %
Platelets: 241 10*3/uL (ref 150–400)
RBC: 5.5 MIL/uL (ref 4.22–5.81)
RDW: 14.5 % (ref 11.5–15.5)
WBC: 9 10*3/uL (ref 4.0–10.5)
nRBC: 0 % (ref 0.0–0.2)

## 2019-02-27 LAB — LACTATE DEHYDROGENASE: LDH: 136 U/L (ref 98–192)

## 2019-03-03 ENCOUNTER — Other Ambulatory Visit: Payer: Self-pay

## 2019-03-06 ENCOUNTER — Inpatient Hospital Stay (HOSPITAL_BASED_OUTPATIENT_CLINIC_OR_DEPARTMENT_OTHER): Payer: Medicare HMO | Admitting: Hematology

## 2019-03-06 ENCOUNTER — Encounter (HOSPITAL_COMMUNITY): Payer: Self-pay | Admitting: Hematology

## 2019-03-06 ENCOUNTER — Other Ambulatory Visit: Payer: Self-pay

## 2019-03-06 VITALS — BP 157/91 | HR 96 | Temp 98.0°F | Resp 16 | Wt 163.0 lb

## 2019-03-06 DIAGNOSIS — C20 Malignant neoplasm of rectum: Secondary | ICD-10-CM | POA: Diagnosis not present

## 2019-03-06 DIAGNOSIS — Z720 Tobacco use: Secondary | ICD-10-CM

## 2019-03-06 DIAGNOSIS — N189 Chronic kidney disease, unspecified: Secondary | ICD-10-CM | POA: Diagnosis not present
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
## 2019-03-06 NOTE — Assessment & Plan Note (Signed)
1.  Stage IIa (UT3N0) rectal adenocarcinoma: - Xeloda plus radiation therapy from 02/03/2011 through 03/13/2011 -LAR by [name], pathology showing YPT2Y PN 0. - XELOX was recommended, but was not done secondary to pain reasons. - Colostomy revision for stomal prolapse in January 2020 by [name]. - Last CT [facility] on 01/20/2018 was negative for metastatic disease. -I have reviewed his labs.  They are within normal limits.  He will follow-up with [place] in 1 year.  2.  CKD: -Creatinine was 1.4 and stable.

## 2019-03-06 NOTE — Patient Instructions (Addendum)
[facility] Discharge Instructions  You were seen today by Dr. Katragadda. He went over your recent lab results. He will see you back in 1 year for labs and follow up.   Thank you for choosing [facility] at [facility] to provide your oncology and hematology care.  To afford each patient quality time with our provider, please arrive at least 15 minutes before your scheduled appointment time.   If you have a lab appointment with the Cancer Center please come in thru the  Main Entrance and check in at the main information desk  You need to re-schedule your appointment should you arrive 10 or more minutes late.  We strive to give you quality time with our providers, and arriving late affects you and other patients whose appointments are after yours.  Also, if you no show three or more times for appointments you may be dismissed from the clinic at the providers discretion.     Again, thank you for choosing [facility].  Our hope is that these requests will decrease the amount of time that you wait before being seen by our physicians.       _____________________________________________________________  Should you have questions after your visit to [facility], please contact our office at [phone] between the hours of 8:00 a.m. and 4:30 p.m.  Voicemails left after 4:00 p.m. will not be returned until the following business day.  For prescription refill requests, have your pharmacy contact our office and allow 72 hours.    Cancer Center Support Programs:   > Cancer Support Group  2nd Tuesday of the month 1pm-2pm, Journey Room    

## 2019-03-13 ENCOUNTER — Other Ambulatory Visit: Payer: Self-pay

## 2019-03-13 DIAGNOSIS — I739 Peripheral vascular disease, unspecified: Secondary | ICD-10-CM

## 2019-03-16 ENCOUNTER — Ambulatory Visit: Payer: Medicare HMO | Admitting: Vascular Surgery

## 2019-03-16 ENCOUNTER — Other Ambulatory Visit (HOSPITAL_COMMUNITY): Payer: Medicare HMO

## 2019-03-21 ENCOUNTER — Telehealth: Payer: Self-pay | Admitting: Student

## 2019-03-21 NOTE — Telephone Encounter (Signed)
Virtual Visit Pre-Appointment Phone Call  "(Name), I am calling you today to discuss your upcoming appointment. We are currently trying to limit exposure to the virus that causes COVID-19 by seeing patients at home rather than in the office."  1. "What is the BEST phone number to call the day of the visit?" - include this in appointment notes  2. Do you have or have access to (through a family member/friend) a smartphone with video capability that we can use for your visit?" a. If yes - list this number in appt notes as cell (if different from BEST phone #) and list the appointment type as a VIDEO visit in appointment notes b. If no - list the appointment type as a PHONE visit in appointment notes  3. Confirm consent - "In the setting of the current Covid19 crisis, you are scheduled for a (phone or video) visit with your provider on (date) at (time).  Just as we do with many in-office visits, in order for you to participate in this visit, we must obtain consent.  If you'd like, I can send this to your mychart (if signed up) or email for you to review.  Otherwise, I can obtain your verbal consent now.  All virtual visits are billed to your insurance company just like a normal visit would be.  By agreeing to a virtual visit, we'd like you to understand that the technology does not allow for your provider to perform an examination, and thus may limit your provider's ability to fully assess your condition. If your provider identifies any concerns that need to be evaluated in person, we will make arrangements to do so.  Finally, though the technology is pretty good, we cannot assure that it will always work on either your or our end, and in the setting of a video visit, we may have to convert it to a phone-only visit.  In either situation, we cannot ensure that we have a secure connection.  Are you willing to proceed?" STAFF: Did the patient verbally acknowledge consent to telehealth visit? Document  YES/NO here: Yes  4. Advise patient to be prepared - "Two hours prior to your appointment, go ahead and check your blood pressure, pulse, oxygen saturation, and your weight (if you have the equipment to check those) and write them all down. When your visit starts, your provider will ask you for this information. If you have an Apple Watch or Kardia device, please plan to have heart rate information ready on the day of your appointment. Please have a pen and paper handy nearby the day of the visit as well."  5. Give patient instructions for MyChart download to smartphone OR [email or web address] as below if video visit (depending on what platform provider is using)  6. Inform patient they will receive a phone call 15 minutes prior to their appointment time (may be from unknown caller ID) so they should be prepared to answer    TELEPHONE CALL NOTE  Oneil Behney has been deemed a candidate for a follow-up tele-health visit to limit community exposure during the Covid-19 pandemic. I spoke with the patient via phone to ensure availability of phone/video source, confirm preferred email & phone number, and discuss instructions and expectations.  I reminded Bryler Dibble to be prepared with any vital sign and/or heart rhythm information that could potentially be obtained via home monitoring, at the time of his visit. I reminded Jamine Highfill to expect a phone call prior to his visit.  Orinda Kenner 03/21/2019 10:52 AM

## 2019-03-23 ENCOUNTER — Telehealth (INDEPENDENT_AMBULATORY_CARE_PROVIDER_SITE_OTHER): Payer: Medicare HMO | Admitting: Student

## 2019-03-23 ENCOUNTER — Other Ambulatory Visit: Payer: Self-pay

## 2019-03-23 ENCOUNTER — Encounter: Payer: Self-pay | Admitting: Student

## 2019-03-23 DIAGNOSIS — I2510 Atherosclerotic heart disease of native coronary artery without angina pectoris: Secondary | ICD-10-CM

## 2019-03-23 DIAGNOSIS — I480 Paroxysmal atrial fibrillation: Secondary | ICD-10-CM

## 2019-03-23 DIAGNOSIS — E785 Hyperlipidemia, unspecified: Secondary | ICD-10-CM

## 2019-03-23 DIAGNOSIS — I739 Peripheral vascular disease, unspecified: Secondary | ICD-10-CM

## 2019-03-23 DIAGNOSIS — I10 Essential (primary) hypertension: Secondary | ICD-10-CM

## 2019-03-23 DIAGNOSIS — Z7189 Other specified counseling: Secondary | ICD-10-CM

## 2019-03-23 NOTE — Progress Notes (Signed)
Virtual Visit via Telephone Note   This visit type was conducted due to national recommendations for restrictions regarding the COVID-19 Pandemic (e.g. social distancing) in an effort to limit this patient's exposure and mitigate transmission in our community.  Due to his co-morbid illnesses, this patient is at least at moderate risk for complications without adequate follow up.  This format is felt to be most appropriate for this patient at this time.  The patient did not have access to video technology/had technical difficulties with video requiring transitioning to audio format only (telephone).  All issues noted in this document were discussed and addressed.  No physical exam could be performed with this format.  Please refer to the patient's chart for his  consent to telehealth for [facility].   Date:  03/23/2019   ID:  Juan Wheeler, DOB 1961-03-11, MRN [id number]  Patient Location: Home Provider Location: Office  PCP:  [name], MD  Cardiologist:  Kate Sable, MD  Electrophysiologist:  None   Evaluation Performed:  Follow-Up Visit  Chief Complaint: No specific symptoms  History of Present Illness:    Juan Wheeler is a 58 y.o. male with past medical history of CAD (s/p DES to LCx in 2014), PAD (s/p left fem-pop bypass in 06/2015),  PAF (on Eliquis, diagnosed in 05/2016), HTN, HLD, colon cancer, and tobacco use who presents to the office today for 14-month follow-up telehealth visit.  He was last examined by Dr. Bronson Ing in 10/2018 and reported having intermittent episodes of chest pain occurring a few times per week which would only last for a few seconds at a time and spontaneously resolved.  He also reported associated fatigue.  A Lexiscan Myoview was obtained for further evaluation and showed evidence of an inferior wall infarct with no ischemia.  EF was reduced at 42%, therefore a follow-up echocardiogram was obtained.  This showed his EF was stable at 50 to 55%  with no regional wall motion abnormalities.  Was continued on his current medication regimen at that time including ASA 81 mg daily, Eliquis 5 mg twice daily, Amlodipine 5 mg daily, Imdur 15 mg daily, and Lisinopril 40 mg daily.  In talking with the patient today, he reports overall doing well from a cardiac perspective since his last visit. Denies any recurrent episodes of chest pain. No recent dyspnea on exertion, orthopnea, PND, or edema. He is not overly active at baseline due to bilateral hip pain and also having his colostomy.   Reports good compliance with his current medication regimen, including Eliquis. Denies any evidence of active bleeding.   The patient does not have symptoms concerning for COVID-19 infection (fever, chills, cough, or new shortness of breath).    Past Medical History:  Diagnosis Date  . Chronic lower back pain    a. Followed by [facility] management at [facility].  . Colon cancer [facility])    rectal cancer  . Coronary artery disease    a. 03/2013: abnl nuc -> LHC s/p DES to LCx, residual moderate disease in LAD (med rx unless refractory angina). b. Not on BB due to bradycardia.  Marland Kitchen DVT (deep venous thrombosis) ([place]) ~ 2013  . Dysrhythmia    AFib  . GERD (gastroesophageal reflux disease)   . H/O necrotising fasciitis   . History of blood transfusion    "once; after throwing up alot of blood" (04/17/2013)  . Hypertension   . LV dysfunction    a. EF 45% in 03/2013.  Marland Kitchen PAD (peripheral artery  disease) ([place])    a. Occlusion of the right internal iliac artery, with significant atherosclerosis in the left internal iliac which was not amenable to reconstruction per notes from [facility].  . Pulmonary nodules 09/30/2014  . Rectal cancer ([place])   . Tobacco abuse    Past Surgical History:  Procedure Laterality Date  . ABDOMINAL SURGERY  1990's   'for stomach ulcers" (04/17/2013)  . COLECTOMY  2012   "for rectal cancer" (04/17/2013)  . COLONOSCOPY  2013   Dr. Cheryll Cockayne:  colorectal anastomosis with ulcer and inflammation, benign biopsy  . COLONOSCOPY WITH PROPOFOL N/A 09/07/2016   Procedure: COLONOSCOPY WITH PROPOFOL;  Surgeon: Daneil Dolin, MD;  [facility];  Service: Endoscopy;  Laterality: N/A;  10:00 am Colonoscopy via rectum and ostomy  . COLOSTOMY TAKEDOWN  2013  . CORONARY ANGIOPLASTY WITH STENT PLACEMENT  04/17/2013   "?1" (04/17/2013)  . FEMORAL-POPLITEAL BYPASS GRAFT Left 07/02/2015   Procedure: BYPASS GRAFT LEFT COMMON FEMORAL ARTERY TO LEFT ABOVE KNEE POPLITEAL ARTERY - USING LEFT GREATER SAPPHENOUS VEIN;  Surgeon: Elam Dutch, MD;  Location: [place];  Service: Vascular;  Laterality: Left;  . INCISION AND DRAINAGE ABSCESS Left 06/05/2016   Procedure: INCISION AND DRAINAGE ABSCESS;  Surgeon: Aviva Signs, MD;  Location: [facility] OR[facility];  Service: General;  Laterality: Left;  . INCISION AND DRAINAGE PERIRECTAL ABSCESS Left 06/07/2016   Procedure: IRRIGATION AND DEBRIDEMENT LEFT BUTTOCK ABSCESS;  Surgeon: Greer Pickerel, MD;  Location: [place];  Service: General;  Laterality: Left;  . INGUINAL HERNIA REPAIR Bilateral 1990's  . LAPAROSCOPIC PARTIAL COLECTOMY N/A 06/11/2016   Procedure: LAPAROSCOPIC  OPEN COLOSTOMY;  Surgeon: [name], MD;  Location: [place];  Service: General;  Laterality: N/A;  . LEFT HEART CATHETERIZATION WITH CORONARY ANGIOGRAM N/A 04/17/2013   Procedure: LEFT HEART CATHETERIZATION WITH CORONARY ANGIOGRAM;  Surgeon: Peter M Martinique, MD;  Location: [facility] CATH LAB;  Service: Cardiovascular;  Laterality: N/A;  . PERCUTANEOUS STENT INTERVENTION  04/17/2013   Procedure: PERCUTANEOUS STENT INTERVENTION;  Surgeon: Peter M Martinique, MD;  Location: [facility] CATH LAB;  Service: Cardiovascular;;  . PERIPHERAL VASCULAR CATHETERIZATION N/A 06/14/2015   Procedure: Abdominal Aortogram;  Surgeon: Elam Dutch, MD;  Location: [place] CV LAB;  Service: Cardiovascular;  Laterality: N/A;  . PERIPHERAL VASCULAR CATHETERIZATION Bilateral 06/14/2015    Procedure: Lower Extremity Angiography;  Surgeon: Elam Dutch, MD;  Location: [place] CV LAB;  Service: Cardiovascular;  Laterality: Bilateral;  . VEIN HARVEST Left 07/02/2015   Procedure: VEIN HARVEST - LEFT GREATER SAPPHENOUS VEIN;  Surgeon: Elam Dutch, MD;  Location: [place];  Service: Vascular;  Laterality: Left;     Current Meds  Medication Sig  . amLODipine (NORVASC) 5 MG tablet Take 5 mg by mouth daily.  Marland Kitchen apixaban (ELIQUIS) 5 MG TABS tablet Take 1 tablet (5 mg total) by mouth 2 (two) times daily.  Marland Kitchen aspirin EC 81 MG tablet Take 81 mg by mouth daily.  Marland Kitchen atorvastatin (LIPITOR) 40 MG tablet Take 40 mg by mouth daily.  . isosorbide mononitrate (IMDUR) 30 MG 24 hr tablet TAKE 1/2 TABLET BY MOUTH ONCE DAILY. (Patient taking differently: Take 15 mg by mouth daily. )  . lisinopril (PRINIVIL,ZESTRIL) 40 MG tablet Take 40 mg by mouth daily.  Marland Kitchen NITROSTAT 0.4 MG SL tablet PLACE 1 TABLET UNDER THE TONGUE AS NEEDED FOR CHEST PAIN UP TO 3 DOSES  . omeprazole (PRILOSEC) 40 MG capsule Take 40 mg by mouth daily.  Allergies:   Darvocet [propoxyphene n-acetaminophen] and Propoxyphene   Social History   Tobacco Use  . Smoking status: Light Tobacco Smoker    Packs/day: 0.25    Years: 40.00    Pack years: 10.00    Types: Cigarettes    Start date: 03/14/1974  . Smokeless tobacco: Never Used  . Tobacco comment: 5-6 per day 06/12/15  Substance Use Topics  . Alcohol use: Yes    Alcohol/week: 3.0 standard drinks    Types: 3 Cans of beer per week  . Drug use: Yes    Types: Marijuana    Comment: 2 days ago      Family Hx: The patient's family history includes Bleeding Disorder in his brother; Cancer in his mother; Hypertension in his mother.  ROS:   Please see the history of present illness.     All other systems reviewed and are negative.   Prior CV studies:   The following studies were reviewed today:  NST: 10/2018  Findings consistent with prior myocardial infarction.   The left ventricular ejection fraction is mildly decreased (45-54%).  This is an intermediate risk study.  Blood pressure demonstrated a normal response to exercise.  There was no ST segment deviation noted during stress.   Inferior wall infarct from apex to base no ischemia EF 42% inferior wall hypokinesis   Echocardiogram: 10/2018 IMPRESSIONS  1. The left ventricle has low normal systolic function, with an ejection fraction of 50-55%. The cavity size was normal. Left ventricular diastolic Doppler parameters are consistent with impaired relaxation.  2. The right ventricle has normal systolic function. The cavity was normal. There is no increase in right ventricular wall thickness.  3. The mitral valve is normal in structure. No evidence of mitral valve stenosis.  4. The tricuspid valve is normal in structure.  5. The aortic valve has an indeterminant number of cusps Mild thickening of the aortic valve no stenosis of the aortic valve.  6. The aortic root is normal in size and structure.  7. Pulmonary hypertension is indeterminant, inadequate TR jet.  8. The interatrial septum was not well visualized.  Labs/Other Tests and Data Reviewed:    EKG:  No ECG reviewed.  Recent Labs: 02/27/2019: ALT 16; BUN 15; Creatinine, Ser 1.45; Hemoglobin 17.9; Platelets 241; Potassium 4.2; Sodium 138   Recent Lipid Panel Lab Results  Component Value Date/Time   CHOL 161 03/29/2013 05:17 AM   TRIG 144 06/10/2016 06:26 AM   HDL 45 03/29/2013 05:17 AM   CHOLHDL 3.6 03/29/2013 05:17 AM   LDLCALC 96 03/29/2013 05:17 AM    Wt Readings from Last 3 Encounters:  03/06/19 163 lb (73.9 kg)  11/03/18 149 lb (67.6 kg)  02/25/18 165 lb 2 oz (74.9 kg)     Objective:    Vital Signs:  There were no vitals taken for this visit.   General: Pleasant male sounding in NAD Psych: Normal affect. Neuro: Alert and oriented X 3. Lungs:  Resp regular and unlabored while talking on the phone.    ASSESSMENT &  PLAN:    1. CAD - he is s/p DES to LCx in 2014 and recent stress test in 10/2018 showed evidence of an inferior wall infarct with no ischemia as outlined above.   - he denies any recurrent chest pain or dyspnea on exertion.  - continue with ASA 81 mg daily, Amlodipine 5 mg daily, Imdur 15 mg daily, and statin therapy. Intolerant to BB therapy in the past due to  bradycardia.   2. Paroxysmal Atrial Fibrillation - Denies any recent palpitations. Has not required the use of AV nodal blocking agents and had bradycardia with BB therapy in the past.  - He denies any evidence of active bleeding. Remains on Eliquis 5 mg twice daily for anticoagulation. He has been continued on both ASA and anticoagulation by his Primary Cardiologist and will defer continuation of ASA to Dr. Bronson Ing. Suspect due to known CAD and PVD.   3. PVD - s/p left fem-pop bypass in 06/2015. He is overdue for follow-up with Vascular Surgery and missed his last appointment due to COVID-19. He was encouraged to reschedule this and was in agreement. He remains on ASA and statin therapy.  4. HTN - he does not have a BP cuff at home but this has been well-controlled at his prior office visits.  BP was elevated at a recent Oncology appointment but he reports having walked to his appointment that day and this was checked as soon as he arrived. Will continue current medication regimen at this time. Amlodipine can be further titrated from 5 mg daily to 10 mg daily if additional BP control is needed in the future.  5. HLD - followed by PCP. LDL at 69 in 2019. Continue Atorvastatin 40mg  daily.   6. COVID-19 Education - The signs and symptoms of COVID-19 were discussed with the patient. The importance of social distancing was discussed today.  Time:   Today, I have spent 17 minutes with the patient with telehealth technology discussing the above problems.     Medication Adjustments/Labs and Tests Ordered: Current medicines are reviewed  at length with the patient today.  Concerns regarding medicines are outlined above.   Tests Ordered: No orders of the defined types were placed in this encounter.   Medication Changes: No orders of the defined types were placed in this encounter.   Follow Up: with Dr. Bronson Ing in 6 months.   Signed, Erma Heritage, [name]  03/23/2019 4:49 PM    [facility]

## 2019-03-23 NOTE — Patient Instructions (Signed)
Medication Instructions: Your physician recommends that you continue on your current medications as directed. Please refer to the Current Medication list given to you today.   Labwork: None  Procedures/Testing: None  Follow-Up: 6 months with Dr.Koneswaran  Any Additional Special Instructions Will Be Listed Below (If Applicable).     If you need a refill on your cardiac medications before your next appointment, please call your pharmacy.      Thank you for choosing [facility] !

## 2019-03-29 DIAGNOSIS — I10 Essential (primary) hypertension: Secondary | ICD-10-CM | POA: Diagnosis not present

## 2019-03-29 DIAGNOSIS — I2510 Atherosclerotic heart disease of native coronary artery without angina pectoris: Secondary | ICD-10-CM | POA: Diagnosis not present

## 2019-03-29 DIAGNOSIS — Z933 Colostomy status: Secondary | ICD-10-CM | POA: Diagnosis not present

## 2019-03-29 DIAGNOSIS — C20 Malignant neoplasm of rectum: Secondary | ICD-10-CM | POA: Diagnosis not present

## 2019-04-29 DIAGNOSIS — I82401 Acute embolism and thrombosis of unspecified deep veins of right lower extremity: Secondary | ICD-10-CM | POA: Diagnosis not present

## 2019-04-29 DIAGNOSIS — F17200 Nicotine dependence, unspecified, uncomplicated: Secondary | ICD-10-CM | POA: Diagnosis not present

## 2019-04-29 DIAGNOSIS — I2510 Atherosclerotic heart disease of native coronary artery without angina pectoris: Secondary | ICD-10-CM | POA: Diagnosis not present

## 2019-05-22 DIAGNOSIS — Z933 Colostomy status: Secondary | ICD-10-CM | POA: Diagnosis not present

## 2019-05-22 DIAGNOSIS — G629 Polyneuropathy, unspecified: Secondary | ICD-10-CM | POA: Diagnosis not present

## 2019-05-22 DIAGNOSIS — I2510 Atherosclerotic heart disease of native coronary artery without angina pectoris: Secondary | ICD-10-CM | POA: Diagnosis not present

## 2019-05-26 ENCOUNTER — Other Ambulatory Visit: Payer: Self-pay

## 2019-05-26 ENCOUNTER — Emergency Department (HOSPITAL_COMMUNITY)
Admission: EM | Admit: 2019-05-26 | Discharge: 2019-05-26 | Disposition: A | Discharge status: Home / Self Care | Payer: Medicare HMO | Attending: Emergency Medicine | Admitting: Emergency Medicine

## 2019-05-26 ENCOUNTER — Encounter (HOSPITAL_COMMUNITY): Payer: Self-pay | Admitting: Emergency Medicine

## 2019-05-26 DIAGNOSIS — I480 Paroxysmal atrial fibrillation: Secondary | ICD-10-CM | POA: Diagnosis not present

## 2019-05-26 DIAGNOSIS — Z7982 Long term (current) use of aspirin: Secondary | ICD-10-CM | POA: Diagnosis not present

## 2019-05-26 DIAGNOSIS — C189 Malignant neoplasm of colon, unspecified: Secondary | ICD-10-CM | POA: Diagnosis not present

## 2019-05-26 DIAGNOSIS — Z4801 Encounter for change or removal of surgical wound dressing: Secondary | ICD-10-CM | POA: Diagnosis present

## 2019-05-26 DIAGNOSIS — Z79899 Other long term (current) drug therapy: Secondary | ICD-10-CM | POA: Insufficient documentation

## 2019-05-26 DIAGNOSIS — I82409 Acute embolism and thrombosis of unspecified deep veins of unspecified lower extremity: Secondary | ICD-10-CM | POA: Insufficient documentation

## 2019-05-26 DIAGNOSIS — I10 Essential (primary) hypertension: Secondary | ICD-10-CM | POA: Diagnosis not present

## 2019-05-26 DIAGNOSIS — Z955 Presence of coronary angioplasty implant and graft: Secondary | ICD-10-CM | POA: Diagnosis not present

## 2019-05-26 DIAGNOSIS — I259 Chronic ischemic heart disease, unspecified: Secondary | ICD-10-CM | POA: Diagnosis not present

## 2019-05-26 DIAGNOSIS — K604 Rectal fistula: Secondary | ICD-10-CM | POA: Diagnosis not present

## 2019-05-26 DIAGNOSIS — F17210 Nicotine dependence, cigarettes, uncomplicated: Secondary | ICD-10-CM | POA: Diagnosis not present

## 2019-05-26 MED ORDER — DIBUCAINE (PERIANAL) 1 % EX OINT: 1.0000 "application " | TOPICAL_OINTMENT | CUTANEOUS | 0 refills | Status: DC | PRN

## 2019-05-26 MED ORDER — AMOXICILLIN-POT CLAVULANATE 875-125 MG PO TABS: 1.0000 | ORAL_TABLET | Freq: Two times a day (BID) | ORAL | 0 refills | Status: DC

## 2019-05-26 NOTE — ED Triage Notes (Signed)
Wound to left buttock.  Surgery at [facility] about 2-3 years ago  Penrose in place, but increase drainage brownish,  and foul smell.  Couple time drainage was blood-tingled.  Chronic pain 8/10.

## 2019-05-26 NOTE — Discharge Instructions (Addendum)
You have severe pain in your: Wound area. Abdomen. You have sudden chest pain. You become weak or you pass out (faint).

## 2019-05-26 NOTE — ED Provider Notes (Signed)
[facility] EMERGENCY DEPARTMENT Provider Note   CSN: [id number] Arrival date & time: 05/26/19  1660     History   Chief Complaint Chief Complaint  Patient presents with  . Wound Check    HPI Juan Wheeler is a 58 y.o. male with an extensive and complicated past medical history including coronary artery disease, peripheral vascular disease, chronic pain, history of necrotizing fasciitis, history of colon cancer, history of rectal cancer, history of colostomy/revision takedown/and replacement of colostomy.  Patient is here with complaint of pain and foul odor from a chronic indwelling drain and a fistula in his buttocks and rectum.  This was placed at [facility].  He complains of pain in the area and foul odor and discharge from the site worsening over the past 10 days.  Patient states that he was told by [facility] that he did not need to return unless there was a complication with it.  He also states that his wife was unable to take him to [facility] today.  He denies fevers or chills.  He denies pain with defecation, he denies pain with urination or difficulty urinating.  He denies abdominal pain or flank pain.    HPI  Past Medical History:  Diagnosis Date  . Chronic lower back pain    a. Followed by pain management at [facility].  . Colon cancer [facility])    rectal cancer  . Coronary artery disease    a. 03/2013: abnl nuc -> LHC s/p DES to LCx, residual moderate disease in LAD (med rx unless refractory angina). b. Not on BB due to bradycardia.  Marland Kitchen DVT (deep venous thrombosis) ([place]) ~ 2013  . Dysrhythmia    AFib  . GERD (gastroesophageal reflux disease)   . H/O necrotising fasciitis   . History of blood transfusion    "once; after throwing up alot of blood" (04/17/2013)  . Hypertension   . LV dysfunction    a. EF 45% in 03/2013.  Marland Kitchen PAD (peripheral artery disease) ([place])    a. Occlusion of the right internal iliac artery, with significant atherosclerosis in the left internal iliac  which was not amenable to reconstruction per notes from [facility].  . Pulmonary nodules 09/30/2014  . Rectal cancer ([place])   . Tobacco abuse     Patient Active Problem List   Diagnosis Date Noted  . History of DVT (deep vein thrombosis) 07/08/2016  . Paroxysmal atrial fibrillation ([place]) 07/08/2016  . SOB (shortness of breath) 07/08/2016  . Sepsis secondary to UTI ([place]) 07/08/2016  . Hypokalemia   . Gastroesophageal reflux disease   . Coronary artery disease involving native coronary artery of native heart with angina pectoris ([place])   . AKI (acute kidney injury) ([place])   . Fournier's gangrene in male 06/05/2016  . Abscess 06/05/2016  . Sepsis ([place])   . Necrotizing fasciitis ([place])   . Central venous catheter in place   . Acute respiratory failure ([place])   . PAD (peripheral artery disease) ([place]) 07/02/2015  . Preoperative cardiovascular examination 06/12/2015  . Cardiomyopathy, ischemic 06/12/2015  . Pulmonary nodules 09/30/2014  . ASCVD (arteriosclerotic cardiovascular disease) 05/02/2013  . Unstable angina ([place]) 04/18/2013  . Chest pain 03/29/2013  . Tobacco abuse 03/29/2013  . Chronic pain syndrome 11/09/2012  . Pain in joint, pelvic region and thigh 11/09/2012  . Neuralgia and neuritis 10/12/2012  . Atherosclerosis of native arteries of extremity with intermittent claudication ([place]) 08/19/2012  . Backache 03/24/2012  . Peripheral vascular disease ([place]) 12/04/2011  .  Compression of vein 12/04/2011  . Heartburn 12/03/2011  . Personal history of digestive disease 11/19/2011  . Depressive disorder 09/24/2011  . Dysuria 08/31/2011  . Impotence of organic origin 08/31/2011  . Urinary frequency 08/31/2011  . Constipation 06/05/2011  . Essential hypertension 06/05/2011  . Malignant neoplasm of rectum ([place]) 12/02/2010    Past Surgical History:  Procedure Laterality Date  . ABDOMINAL SURGERY  1990's   'for stomach ulcers" (04/17/2013)  . COLECTOMY  2012   "for rectal cancer"  (04/17/2013)  . COLONOSCOPY  2013   Dr. Cheryll Cockayne: colorectal anastomosis with ulcer and inflammation, benign biopsy  . COLONOSCOPY WITH PROPOFOL N/A 09/07/2016   Procedure: COLONOSCOPY WITH PROPOFOL;  Surgeon: Daneil Dolin, MD;  [facility];  Service: Endoscopy;  Laterality: N/A;  10:00 am Colonoscopy via rectum and ostomy  . COLOSTOMY TAKEDOWN  2013  . CORONARY ANGIOPLASTY WITH STENT PLACEMENT  04/17/2013   "?1" (04/17/2013)  . FEMORAL-POPLITEAL BYPASS GRAFT Left 07/02/2015   Procedure: BYPASS GRAFT LEFT COMMON FEMORAL ARTERY TO LEFT ABOVE KNEE POPLITEAL ARTERY - USING LEFT GREATER SAPPHENOUS VEIN;  Surgeon: Elam Dutch, MD;  Location: [place];  Service: Vascular;  Laterality: Left;  . INCISION AND DRAINAGE ABSCESS Left 06/05/2016   Procedure: INCISION AND DRAINAGE ABSCESS;  Surgeon: Aviva Signs, MD;  Location: [facility] ORS;  Service: General;  Laterality: Left;  . INCISION AND DRAINAGE PERIRECTAL ABSCESS Left 06/07/2016   Procedure: IRRIGATION AND DEBRIDEMENT LEFT BUTTOCK ABSCESS;  Surgeon: Greer Pickerel, MD;  Location: [place];  Service: General;  Laterality: Left;  . INGUINAL HERNIA REPAIR Bilateral 1990's  . LAPAROSCOPIC PARTIAL COLECTOMY N/A 06/11/2016   Procedure: LAPAROSCOPIC  OPEN COLOSTOMY;  Surgeon: [name], MD;  Location: [place];  Service: General;  Laterality: N/A;  . LEFT HEART CATHETERIZATION WITH CORONARY ANGIOGRAM N/A 04/17/2013   Procedure: LEFT HEART CATHETERIZATION WITH CORONARY ANGIOGRAM;  Surgeon: Peter M Martinique, MD;  Location: [facility] CATH LAB;  Service: Cardiovascular;  Laterality: N/A;  . PERCUTANEOUS STENT INTERVENTION  04/17/2013   Procedure: PERCUTANEOUS STENT INTERVENTION;  Surgeon: Peter M Martinique, MD;  Location: [facility] CATH LAB;  Service: Cardiovascular;;  . PERIPHERAL VASCULAR CATHETERIZATION N/A 06/14/2015   Procedure: Abdominal Aortogram;  Surgeon: Elam Dutch, MD;  Location: [place] CV LAB;  Service: Cardiovascular;  Laterality: N/A;  . PERIPHERAL  VASCULAR CATHETERIZATION Bilateral 06/14/2015   Procedure: Lower Extremity Angiography;  Surgeon: Elam Dutch, MD;  Location: [place] CV LAB;  Service: Cardiovascular;  Laterality: Bilateral;  . VEIN HARVEST Left 07/02/2015   Procedure: VEIN HARVEST - LEFT GREATER SAPPHENOUS VEIN;  Surgeon: Elam Dutch, MD;  Location: [place];  Service: Vascular;  Laterality: Left;        Home Medications    Prior to Admission medications   Medication Sig Start Date End Date Taking? Authorizing Provider  amLODipine (NORVASC) 5 MG tablet Take 5 mg by mouth daily. 07/20/16   [provider]  amoxicillin-clavulanate (AUGMENTIN) 875-125 MG tablet Take 1 tablet by mouth 2 (two) times daily. One po bid x 7 days 05/26/19   Margarita Mail, [name]  apixaban (ELIQUIS) 5 MG TABS tablet Take 1 tablet (5 mg total) by mouth 2 (two) times daily. 06/16/16   Domenic Polite, MD  aspirin EC 81 MG tablet Take 81 mg by mouth daily.    [provider]  atorvastatin (LIPITOR) 40 MG tablet Take 40 mg by mouth daily.    [provider]  dibucaine (NUPERCAINAL) 1 %  OINT Place 1 application rectally as needed for anal irritation. 05/26/19   Sharonna Vinje, Vernie Shanks, [name]  isosorbide mononitrate (IMDUR) 30 MG 24 hr tablet TAKE 1/2 TABLET BY MOUTH ONCE DAILY. Patient taking differently: Take 15 mg by mouth daily.  12/04/15   Herminio Commons, MD  lisinopril (PRINIVIL,ZESTRIL) 40 MG tablet Take 40 mg by mouth daily. 07/20/16   [provider]  NITROSTAT 0.4 MG SL tablet PLACE 1 TABLET UNDER THE TONGUE AS NEEDED FOR CHEST PAIN UP TO 3 DOSES 10/06/16   Herminio Commons, MD  omeprazole (PRILOSEC) 40 MG capsule Take 40 mg by mouth daily. 07/20/16   [provider]    Family History Family History  Problem Relation Age of Onset  . Cancer Mother   . Hypertension Mother   . Bleeding Disorder Brother     Social History Social History   Tobacco Use  . Smoking status: Light Tobacco Smoker     Packs/day: 0.25    Years: 40.00    Pack years: 10.00    Types: Cigarettes    Start date: 03/14/1974  . Smokeless tobacco: Never Used  . Tobacco comment: 5-6 per day 06/12/15  Substance Use Topics  . Alcohol use: Yes    Alcohol/week: 3.0 standard drinks    Types: 3 Cans of beer per week  . Drug use: Yes    Types: Marijuana    Comment: 2 days ago      Allergies   Darvocet [propoxyphene n-acetaminophen] and Propoxyphene   Review of Systems Review of Systems Ten systems reviewed and are negative for acute change, except as noted in the HPI.    Physical Exam Updated Vital Signs BP (!) 133/100 (BP Location: Left Arm)   Pulse 95   Temp 98.9 F (37.2 C) (Oral)   Resp 16   Ht 5\' 9"  (1.753 m)   Wt 75.3 kg   SpO2 99%   BMI 24.51 kg/m   Physical Exam Vitals signs and nursing note reviewed.  Constitutional:      General: He is not in acute distress.    Appearance: He is well-developed. He is not diaphoretic.  HENT:     Head: Normocephalic and atraumatic.  Eyes:     General: No scleral icterus.    Conjunctiva/sclera: Conjunctivae normal.  Neck:     Musculoskeletal: Normal range of motion and neck supple.  Cardiovascular:     Rate and Rhythm: Normal rate and regular rhythm.     Heart sounds: Normal heart sounds.  Pulmonary:     Effort: Pulmonary effort is normal. No respiratory distress.     Breath sounds: Normal breath sounds.  Abdominal:     Palpations: Abdomen is soft.     Tenderness: There is no abdominal tenderness.  Genitourinary:    Comments: Fistula with plastic loop in place through the rectum.  There is tenderness around the fistula site without erythema, swelling.  There is mild discharge and I am unable to express any discharge from the region. No rectal tenderness or fullness. Skin:    General: Skin is warm and dry.  Neurological:     Mental Status: He is alert.  Psychiatric:        Behavior: Behavior normal.      ED Treatments / Results  Labs  (all labs ordered are listed, but only abnormal results are displayed) Labs Reviewed - No data to display  EKG None  Radiology No results found.  Procedures Procedures (including critical care time)  Medications Ordered in ED Medications - No data to display   Initial Impression / Assessment and Plan / ED Course  I have reviewed the triage vital signs and the nursing notes.  Pertinent labs & imaging results that were available during my care of the patient were reviewed by me and considered in my medical decision making (see chart for details).        Patient here with fistula of the left buttocks. It does not appear to be grossly infected.  I doubt any deeper collection of infection.  The patient is hypertensive but does not have any fever, tachycardia. Current plan is to place the patient on Augmentin-follow closely with his surgeons [facility].  He states that he will call today for an appointment.  Patient seen and shared visit with Dr. Laverta Baltimore who agrees with current plan.  Patient understands strict return precautions.  He appears appropriate for discharge at this time  Final Clinical Impressions(s) / ED Diagnoses   Final diagnoses:  Rectal fistula    ED Discharge Orders         Ordered    dibucaine (NUPERCAINAL) 1 % OINT  As needed     05/26/19 0952    amoxicillin-clavulanate (AUGMENTIN) 875-125 MG tablet  2 times daily     05/26/19 0953           Margarita Mail, [name] 05/26/19 0956    Long, Wonda Olds, MD 05/27/19 [phone]

## 2019-06-12 ENCOUNTER — Emergency Department (HOSPITAL_COMMUNITY)
Admission: EM | Admit: 2019-06-12 | Discharge: 2019-06-12 | Disposition: A | Discharge status: Home / Self Care | Payer: Medicare HMO | Attending: Emergency Medicine | Admitting: Emergency Medicine

## 2019-06-12 ENCOUNTER — Encounter (HOSPITAL_COMMUNITY): Payer: Self-pay

## 2019-06-12 ENCOUNTER — Emergency Department (HOSPITAL_COMMUNITY): Payer: Medicare HMO

## 2019-06-12 ENCOUNTER — Other Ambulatory Visit: Payer: Self-pay

## 2019-06-12 DIAGNOSIS — Z955 Presence of coronary angioplasty implant and graft: Secondary | ICD-10-CM | POA: Diagnosis not present

## 2019-06-12 DIAGNOSIS — Z7901 Long term (current) use of anticoagulants: Secondary | ICD-10-CM | POA: Insufficient documentation

## 2019-06-12 DIAGNOSIS — Z7982 Long term (current) use of aspirin: Secondary | ICD-10-CM | POA: Diagnosis not present

## 2019-06-12 DIAGNOSIS — Z79899 Other long term (current) drug therapy: Secondary | ICD-10-CM | POA: Diagnosis not present

## 2019-06-12 DIAGNOSIS — Z85048 Personal history of other malignant neoplasm of rectum, rectosigmoid junction, and anus: Secondary | ICD-10-CM | POA: Insufficient documentation

## 2019-06-12 DIAGNOSIS — I259 Chronic ischemic heart disease, unspecified: Secondary | ICD-10-CM | POA: Insufficient documentation

## 2019-06-12 DIAGNOSIS — I480 Paroxysmal atrial fibrillation: Secondary | ICD-10-CM | POA: Diagnosis not present

## 2019-06-12 DIAGNOSIS — F17210 Nicotine dependence, cigarettes, uncomplicated: Secondary | ICD-10-CM | POA: Insufficient documentation

## 2019-06-12 DIAGNOSIS — M79605 Pain in left leg: Secondary | ICD-10-CM

## 2019-06-12 DIAGNOSIS — I10 Essential (primary) hypertension: Secondary | ICD-10-CM | POA: Insufficient documentation

## 2019-06-12 DIAGNOSIS — M545 Low back pain: Secondary | ICD-10-CM | POA: Diagnosis not present

## 2019-06-12 LAB — CBC WITH DIFFERENTIAL/PLATELET
Abs Immature Granulocytes: 0.01 10*3/uL (ref 0.00–0.07)
Basophils Absolute: 0 10*3/uL (ref 0.0–0.1)
Basophils Relative: 0 %
Eosinophils Absolute: 0.2 10*3/uL (ref 0.0–0.5)
Eosinophils Relative: 2 %
HCT: 49.7 % (ref 39.0–52.0)
Hemoglobin: 16.7 g/dL (ref 13.0–17.0)
Immature Granulocytes: 0 %
Lymphocytes Relative: 23 %
Lymphs Abs: 1.7 10*3/uL (ref 0.7–4.0)
MCH: 33.5 pg (ref 26.0–34.0)
MCHC: 33.6 g/dL (ref 30.0–36.0)
MCV: 99.8 fL (ref 80.0–100.0)
Monocytes Absolute: 0.8 10*3/uL (ref 0.1–1.0)
Monocytes Relative: 10 %
Neutro Abs: 4.9 10*3/uL (ref 1.7–7.7)
Neutrophils Relative %: 65 %
Platelets: 188 10*3/uL (ref 150–400)
RBC: 4.98 MIL/uL (ref 4.22–5.81)
RDW: 13.8 % (ref 11.5–15.5)
WBC: 7.6 10*3/uL (ref 4.0–10.5)
nRBC: 0 % (ref 0.0–0.2)

## 2019-06-12 LAB — COMPREHENSIVE METABOLIC PANEL
ALT: 15 U/L (ref 0–44)
AST: 18 U/L (ref 15–41)
Albumin: 3.5 g/dL (ref 3.5–5.0)
Alkaline Phosphatase: 67 U/L (ref 38–126)
Anion gap: 9 (ref 5–15)
BUN: 18 mg/dL (ref 6–20)
CO2: 21 mmol/L — ABNORMAL LOW (ref 22–32)
Calcium: 9 mg/dL (ref 8.9–10.3)
Chloride: 107 mmol/L (ref 98–111)
Creatinine, Ser: 1.16 mg/dL (ref 0.61–1.24)
GFR calc Af Amer: >60 mL/min (ref >60)
GFR calc non Af Amer: >60 mL/min (ref >60)
Glucose, Bld: 98 mg/dL (ref 70–99)
Potassium: 4.2 mmol/L (ref 3.5–5.1)
Sodium: 137 mmol/L (ref 135–145)
Total Bilirubin: 0.9 mg/dL (ref 0.3–1.2)
Total Protein: 7.6 g/dL (ref 6.5–8.1)

## 2019-06-12 MED ORDER — HYDROMORPHONE HCL 1 MG/ML IJ SOLN
1.0000 mg | Freq: Once | INTRAMUSCULAR | Status: AC
  Administered 2019-06-12: 1 mg via INTRAVENOUS
  Filled 2019-06-12: qty 1

## 2019-06-12 MED ORDER — CYCLOBENZAPRINE HCL 10 MG PO TABS: 10.0000 mg | ORAL_TABLET | Freq: Three times a day (TID) | ORAL | 0 refills | Status: DC | PRN

## 2019-06-12 MED ORDER — OXYCODONE-ACETAMINOPHEN 5-325 MG PO TABS: 1.0000 | ORAL_TABLET | Freq: Four times a day (QID) | ORAL | 0 refills | Status: DC | PRN

## 2019-06-12 MED ORDER — METHYLPREDNISOLONE SODIUM SUCC 125 MG IJ SOLR
125.0000 mg | Freq: Once | INTRAMUSCULAR | Status: AC
  Administered 2019-06-12: 125 mg via INTRAVENOUS
  Filled 2019-06-12: qty 2

## 2019-06-12 MED ORDER — ONDANSETRON HCL 4 MG/2ML IJ SOLN
4.0000 mg | Freq: Once | INTRAMUSCULAR | Status: AC
  Administered 2019-06-12: 4 mg via INTRAVENOUS
  Filled 2019-06-12: qty 2

## 2019-06-12 NOTE — Discharge Instructions (Addendum)
Follow up with dr. Saintclair Halsted or one of his partners in 1-2 weeks

## 2019-06-12 NOTE — ED Triage Notes (Signed)
Pt reports left hip pain that radiates down left leg. Causes left foot to go numb. Pt reports seen here for this previously

## 2019-06-15 NOTE — ED Provider Notes (Signed)
[facility] EMERGENCY DEPARTMENT Provider Note   CSN: [id number] Arrival date & time: 06/12/19  0831     History   Chief Complaint Chief Complaint  Patient presents with  . Leg Pain    HPI Juan Wheeler is a 58 y.o. male.   Patient  Patient complains of left leg pain.  Patient has a history of back problems  The history is provided by the patient. No language interpreter was used.  Leg Pain Location:  Leg Injury: no   Leg location:  L leg Pain details:    Quality:  Aching   Radiates to:  Does not radiate   Severity:  Moderate   Onset quality:  Gradual   Timing:  Constant   Progression:  Worsening Chronicity:  New Dislocation: no   Foreign body present:  No foreign bodies Prior injury to area:  Yes Associated symptoms: back pain   Associated symptoms: no fatigue     Past Medical History:  Diagnosis Date  . Chronic lower back pain    a. Followed by pain management at [facility].  . Colon cancer [facility])    rectal cancer  . Coronary artery disease    a. 03/2013: abnl nuc -> LHC s/p DES to LCx, residual moderate disease in LAD (med rx unless refractory angina). b. Not on BB due to bradycardia.  Marland Kitchen DVT (deep venous thrombosis) ([place]) ~ 2013  . Dysrhythmia    AFib  . GERD (gastroesophageal reflux disease)   . H/O necrotising fasciitis   . History of blood transfusion    "once; after throwing up alot of blood" (04/17/2013)  . Hypertension   . LV dysfunction    a. EF 45% in 03/2013.  Marland Kitchen PAD (peripheral artery disease) ([place])    a. Occlusion of the right internal iliac artery, with significant atherosclerosis in the left internal iliac which was not amenable to reconstruction per notes from [facility].  . Pulmonary nodules 09/30/2014  . Rectal cancer ([place])   . Tobacco abuse     Patient Active Problem List   Diagnosis Date Noted  . History of DVT (deep vein thrombosis) 07/08/2016  . Paroxysmal atrial fibrillation ([place]) 07/08/2016  . SOB (shortness of breath)  07/08/2016  . Sepsis secondary to UTI ([place]) 07/08/2016  . Hypokalemia   . Gastroesophageal reflux disease   . Coronary artery disease involving native coronary artery of native heart with angina pectoris ([place])   . AKI (acute kidney injury) ([place])   . Fournier's gangrene in male 06/05/2016  . Abscess 06/05/2016  . Sepsis ([place])   . Necrotizing fasciitis ([place])   . Central venous catheter in place   . Acute respiratory failure ([place])   . PAD (peripheral artery disease) ([place]) 07/02/2015  . Preoperative cardiovascular examination 06/12/2015  . Cardiomyopathy, ischemic 06/12/2015  . Pulmonary nodules 09/30/2014  . ASCVD (arteriosclerotic cardiovascular disease) 05/02/2013  . Unstable angina ([place]) 04/18/2013  . Chest pain 03/29/2013  . Tobacco abuse 03/29/2013  . Chronic pain syndrome 11/09/2012  . Pain in joint, pelvic region and thigh 11/09/2012  . Neuralgia and neuritis 10/12/2012  . Atherosclerosis of native arteries of extremity with intermittent claudication ([place]) 08/19/2012  . Backache 03/24/2012  . Peripheral vascular disease ([place]) 12/04/2011  . Compression of vein 12/04/2011  . Heartburn 12/03/2011  . Personal history of digestive disease 11/19/2011  . Depressive disorder 09/24/2011  . Dysuria 08/31/2011  . Impotence of organic origin 08/31/2011  . Urinary frequency 08/31/2011  . Constipation 06/05/2011  .  Essential hypertension 06/05/2011  . Malignant neoplasm of rectum ([place]) 12/02/2010    Past Surgical History:  Procedure Laterality Date  . ABDOMINAL SURGERY  1990's   'for stomach ulcers" (04/17/2013)  . COLECTOMY  2012   "for rectal cancer" (04/17/2013)  . COLONOSCOPY  2013   Dr. Cheryll Cockayne: colorectal anastomosis with ulcer and inflammation, benign biopsy  . COLONOSCOPY WITH PROPOFOL N/A 09/07/2016   Procedure: COLONOSCOPY WITH PROPOFOL;  Surgeon: Daneil Dolin, MD;  [facility];  Service: Endoscopy;  Laterality: N/A;  10:00 am Colonoscopy via rectum  and ostomy  . COLOSTOMY TAKEDOWN  2013  . CORONARY ANGIOPLASTY WITH STENT PLACEMENT  04/17/2013   "?1" (04/17/2013)  . FEMORAL-POPLITEAL BYPASS GRAFT Left 07/02/2015   Procedure: BYPASS GRAFT LEFT COMMON FEMORAL ARTERY TO LEFT ABOVE KNEE POPLITEAL ARTERY - USING LEFT GREATER SAPPHENOUS VEIN;  Surgeon: Elam Dutch, MD;  Location: [place];  Service: Vascular;  Laterality: Left;  . INCISION AND DRAINAGE ABSCESS Left 06/05/2016   Procedure: INCISION AND DRAINAGE ABSCESS;  Surgeon: Aviva Signs, MD;  Location: [facility];  Service: General;  Laterality: Left;  . INCISION AND DRAINAGE PERIRECTAL ABSCESS Left 06/07/2016   Procedure: IRRIGATION AND DEBRIDEMENT LEFT BUTTOCK ABSCESS;  Surgeon: Greer Pickerel, MD;  Location: [place];  Service: General;  Laterality: Left;  . INGUINAL HERNIA REPAIR Bilateral 1990's  . LAPAROSCOPIC PARTIAL COLECTOMY N/A 06/11/2016   Procedure: LAPAROSCOPIC  OPEN COLOSTOMY;  Surgeon: [name], MD;  Location: [place];  Service: General;  Laterality: N/A;  . LEFT HEART CATHETERIZATION WITH CORONARY ANGIOGRAM N/A 04/17/2013   Procedure: LEFT HEART CATHETERIZATION WITH CORONARY ANGIOGRAM;  Surgeon: Peter M Martinique, MD;  Location: [facility] CATH LAB;  Service: Cardiovascular;  Laterality: N/A;  . PERCUTANEOUS STENT INTERVENTION  04/17/2013   Procedure: PERCUTANEOUS STENT INTERVENTION;  Surgeon: Peter M Martinique, MD;  Location: [facility] CATH LAB;  Service: Cardiovascular;;  . PERIPHERAL VASCULAR CATHETERIZATION N/A 06/14/2015   Procedure: Abdominal Aortogram;  Surgeon: Elam Dutch, MD;  Location: [place] CV LAB;  Service: Cardiovascular;  Laterality: N/A;  . PERIPHERAL VASCULAR CATHETERIZATION Bilateral 06/14/2015   Procedure: Lower Extremity Angiography;  Surgeon: Elam Dutch, MD;  Location: [place] CV LAB;  Service: Cardiovascular;  Laterality: Bilateral;  . VEIN HARVEST Left 07/02/2015   Procedure: VEIN HARVEST - LEFT GREATER SAPPHENOUS VEIN;  Surgeon: Elam Dutch, MD;  Location: [place];  Service: Vascular;  Laterality: Left;        Home Medications    Prior to Admission medications   Medication Sig Start Date End Date Taking? Authorizing Provider  amLODipine (NORVASC) 5 MG tablet Take 5 mg by mouth daily. 07/20/16  Yes [provider]  apixaban (ELIQUIS) 5 MG TABS tablet Take 1 tablet (5 mg total) by mouth 2 (two) times daily. 06/16/16  Yes Domenic Polite, MD  aspirin EC 81 MG tablet Take 81 mg by mouth daily.   Yes [provider]  dibucaine (NUPERCAINAL) 1 % OINT Place 1 application rectally as needed for anal irritation. 05/26/19  Yes Harris, Abigail, [name]  gabapentin (NEURONTIN) 300 MG capsule Take 1 capsule by mouth 2 (two) times daily. 05/22/19  Yes [provider]  isosorbide mononitrate (IMDUR) 30 MG 24 hr tablet TAKE 1/2 TABLET BY MOUTH ONCE DAILY. Patient taking differently: Take 15 mg by mouth daily.  12/04/15  Yes Herminio Commons, MD  lisinopril (PRINIVIL,ZESTRIL) 40 MG tablet Take 40 mg by mouth daily. 07/20/16  Yes [provider]  metoprolol succinate (TOPROL-XL) 50 MG 24 hr tablet Take 1 tablet by mouth daily. 04/14/19  Yes [provider]  NITROSTAT 0.4 MG SL tablet PLACE 1 TABLET UNDER THE TONGUE AS NEEDED FOR CHEST PAIN UP TO 3 DOSES 10/06/16  Yes Herminio Commons, MD  omeprazole (PRILOSEC) 40 MG capsule Take 40 mg by mouth daily. 07/20/16  Yes [provider]  amoxicillin-clavulanate (AUGMENTIN) 875-125 MG tablet Take 1 tablet by mouth 2 (two) times daily. One po bid x 7 days Patient not taking: Reported on 06/12/2019 05/26/19   Margarita Mail, [name]  atorvastatin (LIPITOR) 40 MG tablet Take 40 mg by mouth daily.    [provider]  cyclobenzaprine (FLEXERIL) 10 MG tablet Take 1 tablet (10 mg total) by mouth 3 (three) times daily as needed for muscle spasms. 06/12/19   Milton Ferguson, MD  oxyCODONE-acetaminophen (PERCOCET/ROXICET) 5-325 MG tablet Take 1 tablet by mouth every 6 (six) hours  as needed. 06/12/19   Milton Ferguson, MD    Family History Family History  Problem Relation Age of Onset  . Cancer Mother   . Hypertension Mother   . Bleeding Disorder Brother     Social History Social History   Tobacco Use  . Smoking status: Light Tobacco Smoker    Packs/day: 0.25    Years: 40.00    Pack years: 10.00    Types: Cigarettes    Start date: 03/14/1974  . Smokeless tobacco: Never Used  . Tobacco comment: 5-6 per day 06/12/15  Substance Use Topics  . Alcohol use: Yes    Alcohol/week: 3.0 standard drinks    Types: 3 Cans of beer per week  . Drug use: Yes    Types: Marijuana    Comment: 2 days ago      Allergies   Darvocet [propoxyphene n-acetaminophen] and Propoxyphene   Review of Systems Review of Systems  Constitutional: Negative for appetite change and fatigue.  HENT: Negative for congestion, ear discharge and sinus pressure.   Eyes: Negative for discharge.  Respiratory: Negative for cough.   Cardiovascular: Negative for chest pain.  Gastrointestinal: Negative for abdominal pain and diarrhea.  Genitourinary: Negative for frequency and hematuria.  Musculoskeletal: Positive for back pain.  Skin: Negative for rash.  Neurological: Negative for seizures and headaches.  Psychiatric/Behavioral: Negative for hallucinations.     Physical Exam Updated Vital Signs BP (!) 142/68   Pulse 80   Temp 98 F (36.7 C) (Oral)   Resp 16   Ht 5\' 9"  (1.753 m)   Wt 74.4 kg   SpO2 98%   BMI 24.22 kg/m   Physical Exam Vitals signs and nursing note reviewed.  Constitutional:      Appearance: He is well-developed.  HENT:     Head: Normocephalic.     Nose: Nose normal.  Eyes:     General: No scleral icterus.    Conjunctiva/sclera: Conjunctivae normal.  Neck:     Musculoskeletal: Neck supple.     Thyroid: No thyromegaly.  Cardiovascular:     Rate and Rhythm: Normal rate and regular rhythm.     Heart sounds: No murmur. No friction rub. No gallop.    Pulmonary:     Breath sounds: No stridor. No wheezing or rales.  Chest:     Chest wall: No tenderness.  Abdominal:     General: There is no distension.     Tenderness: There is no abdominal tenderness. There is no rebound.  Musculoskeletal: Normal range of motion.  Comments: Tender lumbar spine  Lymphadenopathy:     Cervical: No cervical adenopathy.  Skin:    Findings: No erythema or rash.  Neurological:     Mental Status: He is oriented to person, place, and time.     Motor: No abnormal muscle tone.     Coordination: Coordination normal.     Comments: Positive straight leg raise on the left  Psychiatric:        Behavior: Behavior normal.      ED Treatments / Results  Labs (all labs ordered are listed, but only abnormal results are displayed) Labs Reviewed  COMPREHENSIVE METABOLIC PANEL - Abnormal; Notable for the following components:      Result Value   CO2 21 (*)    All other components within normal limits  CBC WITH DIFFERENTIAL/PLATELET    EKG None  Radiology No results found.  Procedures Procedures (including critical care time)  Medications Ordered in ED Medications  HYDROmorphone (DILAUDID) injection 1 mg (1 mg Intravenous Given 06/12/19 1154)  ondansetron (ZOFRAN) injection 4 mg (4 mg Intravenous Given 06/12/19 1154)  methylPREDNISolone sodium succinate (SOLU-MEDROL) 125 mg/2 mL injection 125 mg (125 mg Intravenous Given 06/12/19 1154)     Initial Impression / Assessment and Plan / ED Course  I have reviewed the triage vital signs and the nursing notes.  Pertinent labs & imaging results that were available during my care of the patient were reviewed by me and considered in my medical decision making (see chart for details).   MRI shows disc disease.  Patient is referred to neurosurgery and will follow-up      Final Clinical Impressions(s) / ED Diagnoses   Final diagnoses:  Left leg pain    ED Discharge Orders         Ordered     cyclobenzaprine (FLEXERIL) 10 MG tablet  3 times daily PRN     06/12/19 1434    oxyCODONE-acetaminophen (PERCOCET/ROXICET) 5-325 MG tablet  Every 6 hours PRN     06/12/19 1434           Milton Ferguson, MD 06/15/19 1317

## 2019-06-22 DIAGNOSIS — I82401 Acute embolism and thrombosis of unspecified deep veins of right lower extremity: Secondary | ICD-10-CM | POA: Diagnosis not present

## 2019-06-22 DIAGNOSIS — M5126 Other intervertebral disc displacement, lumbar region: Secondary | ICD-10-CM | POA: Diagnosis not present

## 2019-06-22 DIAGNOSIS — I2510 Atherosclerotic heart disease of native coronary artery without angina pectoris: Secondary | ICD-10-CM | POA: Diagnosis not present

## 2019-07-03 DIAGNOSIS — I10 Essential (primary) hypertension: Secondary | ICD-10-CM | POA: Diagnosis not present

## 2019-07-03 DIAGNOSIS — Z7901 Long term (current) use of anticoagulants: Secondary | ICD-10-CM | POA: Diagnosis not present

## 2019-07-03 DIAGNOSIS — Y842 Radiological procedure and radiotherapy as the cause of abnormal reaction of the patient, or of later complication, without mention of misadventure at the time of the procedure: Secondary | ICD-10-CM | POA: Diagnosis not present

## 2019-07-03 DIAGNOSIS — K605 Anorectal fistula: Secondary | ICD-10-CM | POA: Diagnosis not present

## 2019-07-03 DIAGNOSIS — I2510 Atherosclerotic heart disease of native coronary artery without angina pectoris: Secondary | ICD-10-CM | POA: Diagnosis not present

## 2019-07-03 DIAGNOSIS — Z85048 Personal history of other malignant neoplasm of rectum, rectosigmoid junction, and anus: Secondary | ICD-10-CM | POA: Diagnosis not present

## 2019-07-03 DIAGNOSIS — Z933 Colostomy status: Secondary | ICD-10-CM | POA: Diagnosis not present

## 2019-07-03 DIAGNOSIS — I739 Peripheral vascular disease, unspecified: Secondary | ICD-10-CM | POA: Diagnosis not present

## 2019-07-13 DIAGNOSIS — Z933 Colostomy status: Secondary | ICD-10-CM | POA: Diagnosis not present

## 2019-07-13 DIAGNOSIS — Y842 Radiological procedure and radiotherapy as the cause of abnormal reaction of the patient, or of later complication, without mention of misadventure at the time of the procedure: Secondary | ICD-10-CM | POA: Diagnosis not present

## 2019-07-13 DIAGNOSIS — K605 Anorectal fistula: Secondary | ICD-10-CM | POA: Diagnosis not present

## 2019-07-13 DIAGNOSIS — Z7901 Long term (current) use of anticoagulants: Secondary | ICD-10-CM | POA: Diagnosis not present

## 2019-07-13 DIAGNOSIS — I739 Peripheral vascular disease, unspecified: Secondary | ICD-10-CM | POA: Diagnosis not present

## 2019-07-13 DIAGNOSIS — Z85048 Personal history of other malignant neoplasm of rectum, rectosigmoid junction, and anus: Secondary | ICD-10-CM | POA: Diagnosis not present

## 2019-07-13 DIAGNOSIS — I10 Essential (primary) hypertension: Secondary | ICD-10-CM | POA: Diagnosis not present

## 2019-07-13 DIAGNOSIS — I2510 Atherosclerotic heart disease of native coronary artery without angina pectoris: Secondary | ICD-10-CM | POA: Diagnosis not present

## 2019-07-17 DIAGNOSIS — Z933 Colostomy status: Secondary | ICD-10-CM | POA: Diagnosis not present

## 2019-07-22 DIAGNOSIS — I2510 Atherosclerotic heart disease of native coronary artery without angina pectoris: Secondary | ICD-10-CM | POA: Diagnosis not present

## 2019-07-22 DIAGNOSIS — I10 Essential (primary) hypertension: Secondary | ICD-10-CM | POA: Diagnosis not present

## 2019-08-03 DIAGNOSIS — Z85048 Personal history of other malignant neoplasm of rectum, rectosigmoid junction, and anus: Secondary | ICD-10-CM | POA: Diagnosis not present

## 2019-08-03 DIAGNOSIS — Z7901 Long term (current) use of anticoagulants: Secondary | ICD-10-CM | POA: Diagnosis not present

## 2019-08-03 DIAGNOSIS — Z933 Colostomy status: Secondary | ICD-10-CM | POA: Diagnosis not present

## 2019-08-03 DIAGNOSIS — K605 Anorectal fistula: Secondary | ICD-10-CM | POA: Diagnosis not present

## 2019-08-03 DIAGNOSIS — I739 Peripheral vascular disease, unspecified: Secondary | ICD-10-CM | POA: Diagnosis not present

## 2019-08-03 DIAGNOSIS — Y842 Radiological procedure and radiotherapy as the cause of abnormal reaction of the patient, or of later complication, without mention of misadventure at the time of the procedure: Secondary | ICD-10-CM | POA: Diagnosis not present

## 2019-08-03 DIAGNOSIS — I2510 Atherosclerotic heart disease of native coronary artery without angina pectoris: Secondary | ICD-10-CM | POA: Diagnosis not present

## 2019-08-03 DIAGNOSIS — I10 Essential (primary) hypertension: Secondary | ICD-10-CM | POA: Diagnosis not present

## 2019-08-06 IMAGING — CT CT ABD-PELV W/ CM
2 of 5 series · 15 of 46 positions shown, 17 images · IV contrast (Isovue)
Comparison: None.

CLINICAL DATA: Bleeding from colostomy.

EXAM:
CT ABDOMEN AND PELVIS WITH CONTRAST
TECHNIQUE: Multidetector CT imaging of the abdomen and pelvis was performed
using the standard protocol following bolus administration of
intravenous contrast.
CONTRAST:  100mL [id number] IOPAMIDOL (XF46O7-QGG) INJECTION 61%

[Series 2: axial st · axial · 0.82mm/px · z∈[+829,+1209]mm · 12 of 86 slices shown, 14 images]
[im 5/86  soft-tissue]
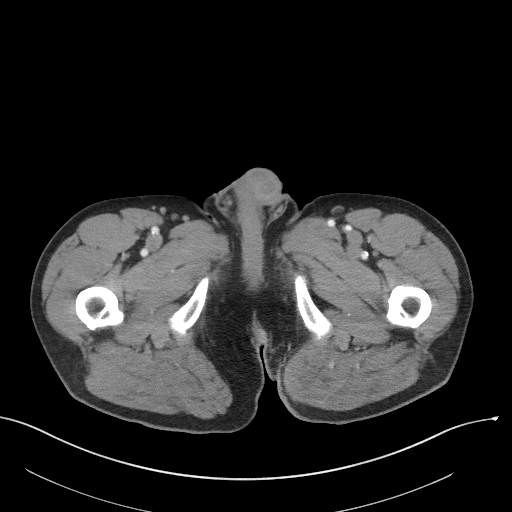
[im 5/86  bone]
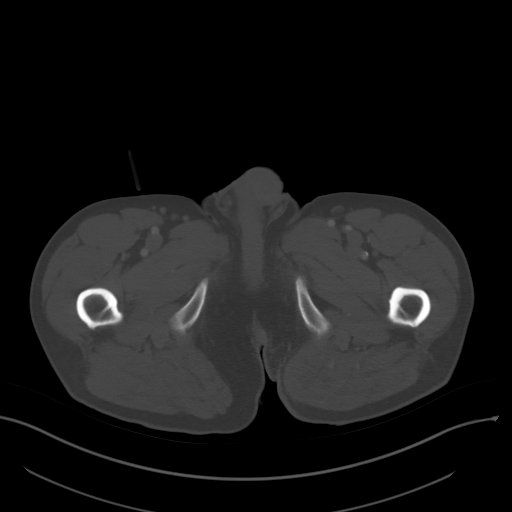
[im 13/86  soft-tissue]
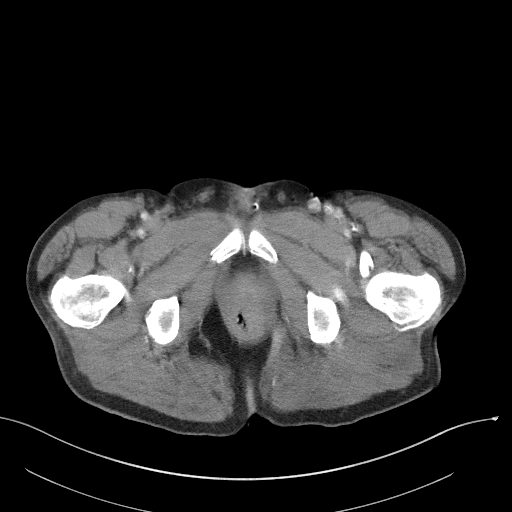
[im 18/86  soft-tissue]
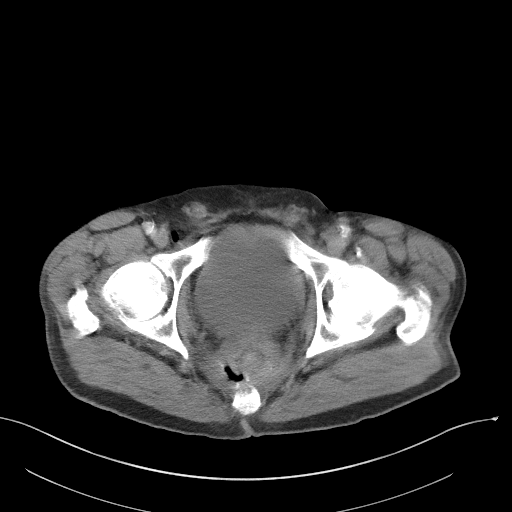
[im 26/86  soft-tissue]
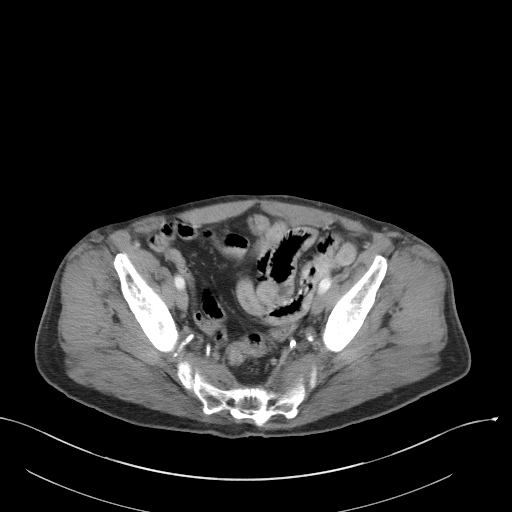
[im 35/86  soft-tissue]
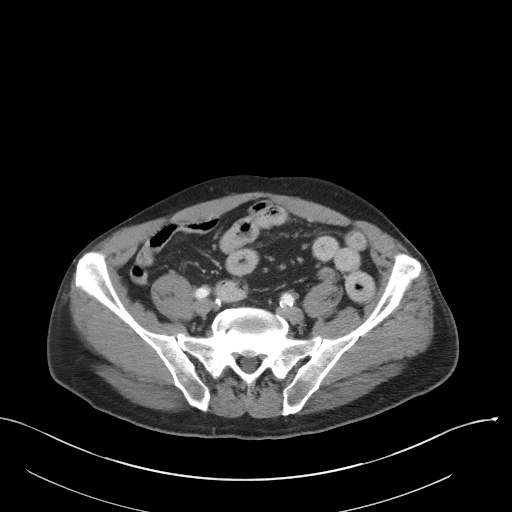
[im 39/86  soft-tissue]
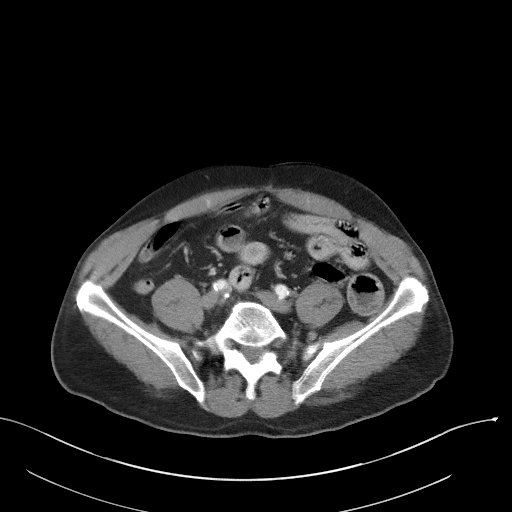
[im 47/86  soft-tissue]
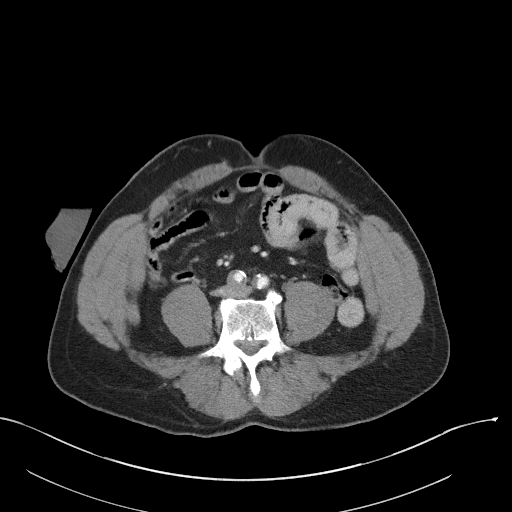
[im 52/86  soft-tissue]
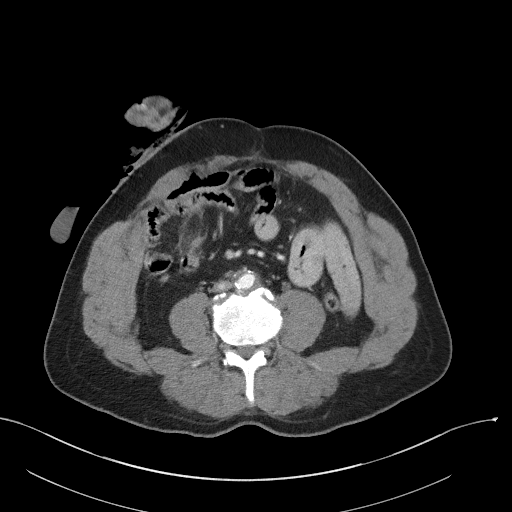
[im 60/86  soft-tissue]
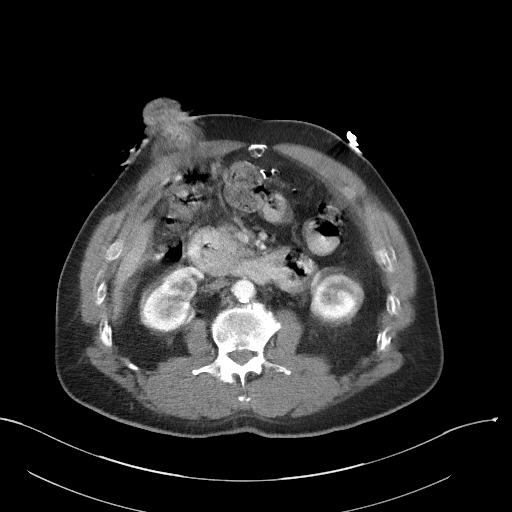
[im 60/86  bone]
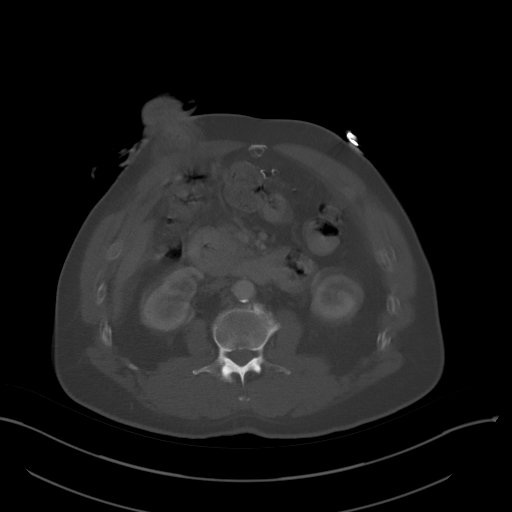
[im 69/86  soft-tissue]
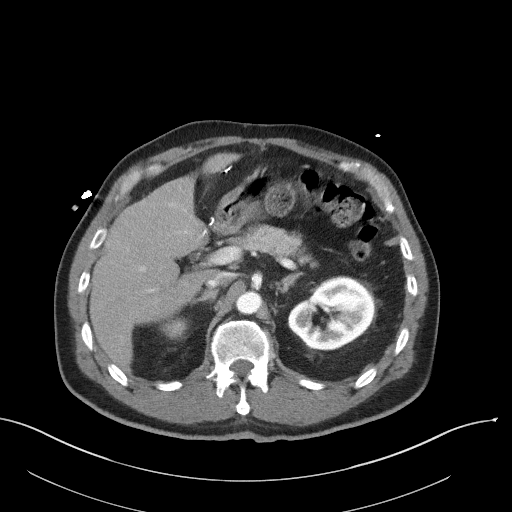
[im 73/86  soft-tissue]
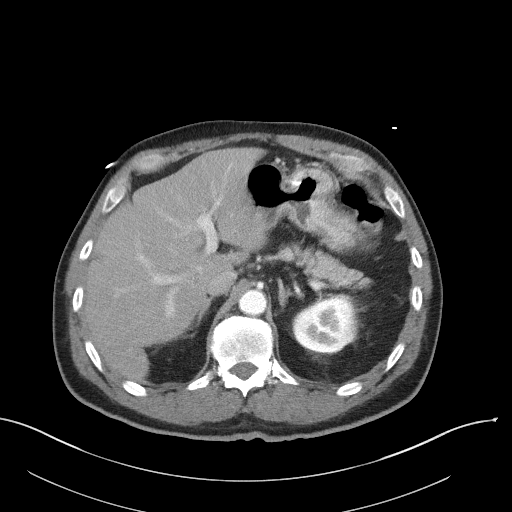
[im 81/86  soft-tissue]
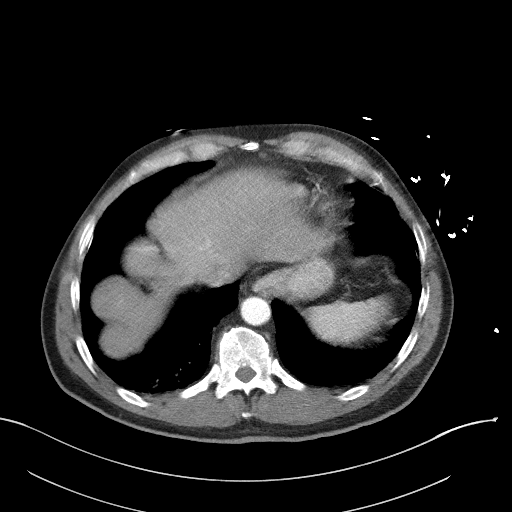

[Series 5: coronal st · coronal · 0.74mm/px · 3 of 97 slices shown]
[im 33/97  soft-tissue]
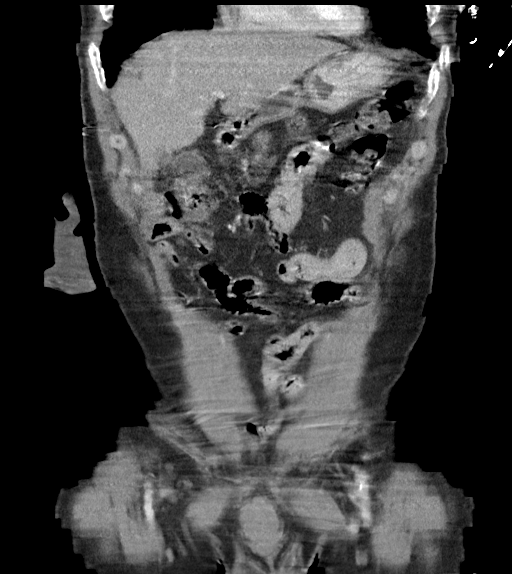
[im 43/97  soft-tissue]
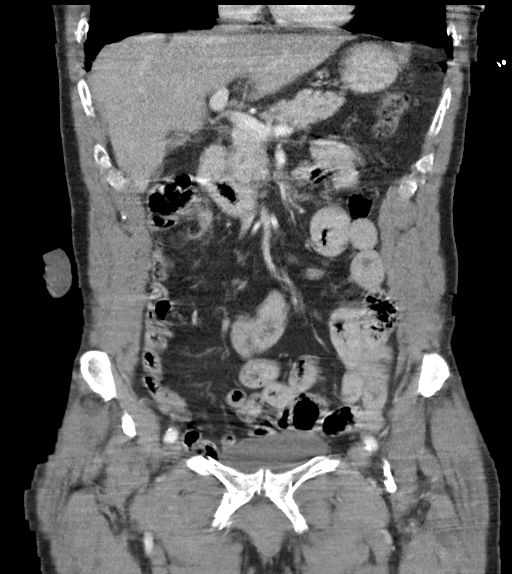
[im 54/97  soft-tissue]
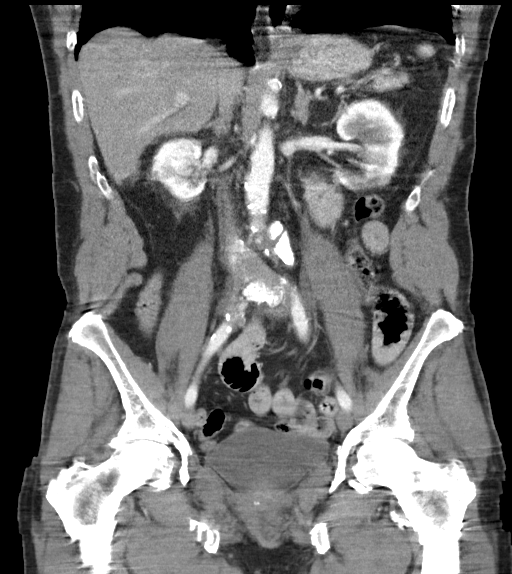

[15 of 46 positions shown; findings below may reference images not displayed]

FINDINGS: Lower chest: Motion degradation limits assessment of lung parenchyma
in the bases.

Hepatobiliary: No focal abnormality within the liver parenchyma.
There is no evidence for gallstones, gallbladder wall thickening, or
pericholecystic fluid. No intrahepatic or extrahepatic biliary
dilation.

Pancreas: No focal mass lesion. No dilatation of the main duct. No
intraparenchymal cyst. No peripancreatic edema.

Spleen: No splenomegaly. No focal mass lesion.

Adrenals/Urinary Tract: No adrenal nodule or mass. Kidneys
unremarkable. No evidence for hydroureter. The urinary bladder
appears normal for the degree of distention.

Stomach/Bowel: Stomach is nondistended. No gastric wall thickening.
No evidence of outlet obstruction. Prominent mucosal folds in the
jejunum. Ileum unremarkable. Patient has an ostomy in the right
abdomen, apparently an end colostomy although the marked motion
degradation hinders assessment. There appears to be along Hartmann's
pouch with blind end identified at the mid transverse colon level.
There is some edema/inflammation along the right colon tracking into
the ostomy, and associated wall thickening. Features are better
demonstrated on the renal delay series with less motion artifact

Vascular/Lymphatic: There is abdominal aortic atherosclerosis
without aneurysm. Prominent atherosclerotic plaque noted at the
origin of the right common iliac artery. There is no gastrohepatic
or hepatoduodenal ligament lymphadenopathy. No intraperitoneal or
retroperitoneal lymphadenopathy. No pelvic sidewall lymphadenopathy.

Reproductive: Prostate gland obscured by motion.

Other: There is abnormal presacral soft tissue attenuation with a
gas collection identified between the rectum and the sacrum.

Musculoskeletal: Bone windows reveal no worrisome lytic or sclerotic
osseous lesions. Scarring is noted in the medial left gluteal
region.
IMPRESSION: 1. Wall thickening in the right colon, tracking into the colostomy
with subtle pericolonic edema/inflammation. Infectious/inflammatory
colitis would be a consideration. There is protrusion of the ostomy
beyond the skin.
2. Prominent mucosal folds in the jejunum without obstruction. No
perienteric edema to suggest enteritis.
3. Abnormal presacral soft tissue is presumably treatment related.
There is a small gas collection between the sacrum and the distal
rectum. Small abscess cavity could have this appearance. Presacral
gas was seen on a remote study from 06/05/2016, but was more on the
left than the right at that time. The amount of gas has decreased
since [DATE].  Aortic Atherosclerois ([id number]-170.0)

## 2019-08-10 DIAGNOSIS — K605 Anorectal fistula: Secondary | ICD-10-CM | POA: Diagnosis not present

## 2019-08-10 DIAGNOSIS — I2510 Atherosclerotic heart disease of native coronary artery without angina pectoris: Secondary | ICD-10-CM | POA: Diagnosis not present

## 2019-08-10 DIAGNOSIS — Z85048 Personal history of other malignant neoplasm of rectum, rectosigmoid junction, and anus: Secondary | ICD-10-CM | POA: Diagnosis not present

## 2019-08-10 DIAGNOSIS — Z933 Colostomy status: Secondary | ICD-10-CM | POA: Diagnosis not present

## 2019-08-10 DIAGNOSIS — I10 Essential (primary) hypertension: Secondary | ICD-10-CM | POA: Diagnosis not present

## 2019-08-10 DIAGNOSIS — Y842 Radiological procedure and radiotherapy as the cause of abnormal reaction of the patient, or of later complication, without mention of misadventure at the time of the procedure: Secondary | ICD-10-CM | POA: Diagnosis not present

## 2019-08-10 DIAGNOSIS — I739 Peripheral vascular disease, unspecified: Secondary | ICD-10-CM | POA: Diagnosis not present

## 2019-08-10 DIAGNOSIS — Z7901 Long term (current) use of anticoagulants: Secondary | ICD-10-CM | POA: Diagnosis not present

## 2019-08-17 DIAGNOSIS — Z0001 Encounter for general adult medical examination with abnormal findings: Secondary | ICD-10-CM | POA: Diagnosis not present

## 2019-08-17 DIAGNOSIS — Z933 Colostomy status: Secondary | ICD-10-CM | POA: Diagnosis not present

## 2019-08-17 DIAGNOSIS — Z1331 Encounter for screening for depression: Secondary | ICD-10-CM | POA: Diagnosis not present

## 2019-08-17 DIAGNOSIS — I10 Essential (primary) hypertension: Secondary | ICD-10-CM | POA: Diagnosis not present

## 2019-08-17 DIAGNOSIS — Z1389 Encounter for screening for other disorder: Secondary | ICD-10-CM | POA: Diagnosis not present

## 2019-08-17 DIAGNOSIS — K605 Anorectal fistula: Secondary | ICD-10-CM | POA: Diagnosis not present

## 2019-09-07 ENCOUNTER — Emergency Department (HOSPITAL_COMMUNITY)
Admission: EM | Admit: 2019-09-07 | Discharge: 2019-09-07 | Disposition: A | Discharge status: Home / Self Care | Payer: Medicare HMO | Attending: Emergency Medicine | Admitting: Emergency Medicine

## 2019-09-07 ENCOUNTER — Other Ambulatory Visit: Payer: Self-pay

## 2019-09-07 ENCOUNTER — Emergency Department (HOSPITAL_COMMUNITY): Payer: Medicare HMO

## 2019-09-07 ENCOUNTER — Encounter (HOSPITAL_COMMUNITY): Payer: Self-pay | Admitting: *Deleted

## 2019-09-07 DIAGNOSIS — Z7982 Long term (current) use of aspirin: Secondary | ICD-10-CM | POA: Diagnosis not present

## 2019-09-07 DIAGNOSIS — F17210 Nicotine dependence, cigarettes, uncomplicated: Secondary | ICD-10-CM | POA: Diagnosis not present

## 2019-09-07 DIAGNOSIS — I10 Essential (primary) hypertension: Secondary | ICD-10-CM | POA: Insufficient documentation

## 2019-09-07 DIAGNOSIS — M1612 Unilateral primary osteoarthritis, left hip: Secondary | ICD-10-CM | POA: Insufficient documentation

## 2019-09-07 DIAGNOSIS — M79605 Pain in left leg: Secondary | ICD-10-CM

## 2019-09-07 DIAGNOSIS — Z79899 Other long term (current) drug therapy: Secondary | ICD-10-CM | POA: Diagnosis not present

## 2019-09-07 DIAGNOSIS — Z85048 Personal history of other malignant neoplasm of rectum, rectosigmoid junction, and anus: Secondary | ICD-10-CM | POA: Insufficient documentation

## 2019-09-07 DIAGNOSIS — R52 Pain, unspecified: Secondary | ICD-10-CM

## 2019-09-07 DIAGNOSIS — Z7901 Long term (current) use of anticoagulants: Secondary | ICD-10-CM | POA: Insufficient documentation

## 2019-09-07 DIAGNOSIS — I2510 Atherosclerotic heart disease of native coronary artery without angina pectoris: Secondary | ICD-10-CM | POA: Insufficient documentation

## 2019-09-07 DIAGNOSIS — M1611 Unilateral primary osteoarthritis, right hip: Secondary | ICD-10-CM | POA: Diagnosis not present

## 2019-09-07 MED ORDER — HYDROCODONE-ACETAMINOPHEN 5-325 MG PO TABS
2.0000 | ORAL_TABLET | Freq: Once | ORAL | Status: AC
  Administered 2019-09-07: 2 via ORAL
  Filled 2019-09-07: qty 2

## 2019-09-07 MED ORDER — OXYCODONE-ACETAMINOPHEN 5-325 MG PO TABS: 1.0000 | ORAL_TABLET | Freq: Four times a day (QID) | ORAL | 0 refills | Status: DC | PRN

## 2019-09-07 MED ORDER — DEXAMETHASONE SODIUM PHOSPHATE 10 MG/ML IJ SOLN
10.0000 mg | Freq: Once | INTRAMUSCULAR | Status: AC
  Administered 2019-09-07: 14:00:00 10 mg via INTRAMUSCULAR
  Filled 2019-09-07: qty 1

## 2019-09-07 NOTE — ED Triage Notes (Signed)
Patient comes to the ED with left leg pain for about 6 months.  Patient states he has been to a specialist with no relief.

## 2019-09-07 NOTE — ED Notes (Signed)
Patient left prior to signing discharge papers

## 2019-09-07 NOTE — Discharge Instructions (Signed)
Your left hip x-ray showed degenerative changes consistent with arthritis. We treated your pain and provided you with a  steroid injection which may help for a few days. However, you will need to follow up with your PCP to obtain further pain management. I will also recommend that you see your orthopedist to discuss long term treatment options.

## 2019-09-07 NOTE — ED Provider Notes (Cosign Needed)
[facility] EMERGENCY DEPARTMENT Provider Note   CSN: [id number] Arrival date & time: 09/07/19  5498     History Chief Complaint  Patient presents with   Leg Pain    left    Juan Wheeler is a 58 y.o. male presenting with worsening left leg pain x ~1 month. PMH notable for HTN, GERD, CAD/PAD with DES in 2014, prior DVT in 2013, previous rectal cancer s/p colectomy in 2017, tobacco use, atrial fibrillation on Eliquis, chronic back pain. Patient notes pain is primarily located in groin that occasionally radiates down leg particularly when leg is straight, with walking, or movement.  Patient has been treating the pain with muscle relaxer and gabapentin without any improvement. He had a left hip x-ray in 10/2018 that was notable for dystrophic calcification consistent with chronic calcific tendinosis of the psoas muscle plus chronic avascular necrosis of the left femoral head. Additionally, patient was treated by ED provider on 9/14. MRI of lumbar spine obtained at that time and notable for Spondylosis appears worst at L4-5 where there is a disc bulge and small left paracentral protrusion. Moderate central canal narrowing is present at this level with narrowing in the subarticular recesses which could impact the L5 roots, greater on the left. At that time he was treated with Flexeril, Percocet, and Solumedrol and referred to outpatient orthopedics. He notes he had improvement in his pain with that treatment. He notes he saw the orthopedic doctor that recommended against surgery at this time and referred him to "another doctor" that was going to possibly place a steroid shot into the joint. Patient has not seen that physician due to the high costs of seeing so many specialists. He notes that the pain has gotten so severe he is unable to ambulate well without fear of falling. It has become very uncomfortable even at rest. Patient lives at home with wife. Ambulates normally with cane. Denies any new falls  or trauma to the hip. Denies any lower extremity swelling.    Past Medical History:  Diagnosis Date   Chronic lower back pain    a. Followed by pain management at [facility].   Colon cancer [facility])    rectal cancer   Coronary artery disease    a. 03/2013: abnl nuc -> LHC s/p DES to LCx, residual moderate disease in LAD (med rx unless refractory angina). b. Not on BB due to bradycardia.   DVT (deep venous thrombosis) ([place]) ~ 2013   Dysrhythmia    AFib   GERD (gastroesophageal reflux disease)    H/O necrotising fasciitis    History of blood transfusion    "once; after throwing up alot of blood" (04/17/2013)   Hypertension    LV dysfunction    a. EF 45% in 03/2013.   PAD (peripheral artery disease) ([place])    a. Occlusion of the right internal iliac artery, with significant atherosclerosis in the left internal iliac which was not amenable to reconstruction per notes from [facility].   Pulmonary nodules 09/30/2014   Rectal cancer ([place])    Tobacco abuse     Patient Active Problem List   Diagnosis Date Noted   History of DVT (deep vein thrombosis) 07/08/2016   Paroxysmal atrial fibrillation ([place]) 07/08/2016   SOB (shortness of breath) 07/08/2016   Sepsis secondary to UTI ([place]) 07/08/2016   Hypokalemia    Gastroesophageal reflux disease    Coronary artery disease involving native coronary artery of native heart with angina pectoris ([place])  AKI (acute kidney injury) ([place])    Fournier's gangrene in male 06/05/2016   Abscess 06/05/2016   Sepsis ([place])    Necrotizing fasciitis ([place])    Central venous catheter in place    Acute respiratory failure (H[facility])    PAD (peripheral artery disease) ([place]) 07/02/2015   Preoperative cardiovascular examination 06/12/2015   Cardiomyopathy, ischemic 06/12/2015   Pulmonary nodules 09/30/2014   ASCVD (arteriosclerotic cardiovascular disease) 05/02/2013   Unstable angina ([place]) 04/18/2013   Chest pain 03/29/2013    Tobacco abuse 03/29/2013   Chronic pain syndrome 11/09/2012   Pain in joint, pelvic region and thigh 11/09/2012   Neuralgia and neuritis 10/12/2012   Atherosclerosis of native arteries of extremity with intermittent claudication ([place]) 08/19/2012   Backache 03/24/2012   Peripheral vascular disease ([place]) 12/04/2011   Compression of vein 12/04/2011   Heartburn 12/03/2011   Personal history of digestive disease 11/19/2011   Depressive disorder 09/24/2011   Dysuria 08/31/2011   Impotence of organic origin 08/31/2011   Urinary frequency 08/31/2011   Constipation 06/05/2011   Essential hypertension 06/05/2011   Malignant neoplasm of rectum ([place]) 12/02/2010    Past Surgical History:  Procedure Laterality Date   ABDOMINAL SURGERY  1990's   'for stomach ulcers" (04/17/2013)   COLECTOMY  2012   "for rectal cancer" (04/17/2013)   COLONOSCOPY  2013   Dr. Cheryll Cockayne: colorectal anastomosis with ulcer and inflammation, benign biopsy   COLONOSCOPY WITH PROPOFOL N/A 09/07/2016   Procedure: COLONOSCOPY WITH PROPOFOL;  Surgeon: Daneil Dolin, MD;  [facility];  Service: Endoscopy;  Laterality: N/A;  10:00 am Colonoscopy via rectum and ostomy   COLOSTOMY TAKEDOWN  2013   CORONARY ANGIOPLASTY WITH STENT PLACEMENT  04/17/2013   "?1" (04/17/2013)   FEMORAL-POPLITEAL BYPASS GRAFT Left 07/02/2015   Procedure: BYPASS GRAFT LEFT COMMON FEMORAL ARTERY TO LEFT ABOVE KNEE POPLITEAL ARTERY - USING LEFT GREATER SAPPHENOUS VEIN;  Surgeon: Elam Dutch, MD;  Location: [place];  Service: Vascular;  Laterality: Left;   INCISION AND DRAINAGE ABSCESS Left 06/05/2016   Procedure: INCISION AND DRAINAGE ABSCESS;  Surgeon: Aviva Signs, MD;  Location: [facility];  Service: General;  Laterality: Left;   INCISION AND DRAINAGE PERIRECTAL ABSCESS Left 06/07/2016   Procedure: IRRIGATION AND DEBRIDEMENT LEFT BUTTOCK ABSCESS;  Surgeon: Greer Pickerel, MD;  Location: [place];  Service: General;   Laterality: Left;   INGUINAL HERNIA REPAIR Bilateral 1990's   LAPAROSCOPIC PARTIAL COLECTOMY N/A 06/11/2016   Procedure: LAPAROSCOPIC  OPEN COLOSTOMY;  Surgeon: [name], MD;  Location: [place];  Service: General;  Laterality: N/A;   LEFT HEART CATHETERIZATION WITH CORONARY ANGIOGRAM N/A 04/17/2013   Procedure: LEFT HEART CATHETERIZATION WITH CORONARY ANGIOGRAM;  Surgeon: Peter M Martinique, MD;  Location: [facility] CATH LAB;  Service: Cardiovascular;  Laterality: N/A;   PERCUTANEOUS STENT INTERVENTION  04/17/2013   Procedure: PERCUTANEOUS STENT INTERVENTION;  Surgeon: Peter M Martinique, MD;  Location: [facility] CATH LAB;  Service: Cardiovascular;;   PERIPHERAL VASCULAR CATHETERIZATION N/A 06/14/2015   Procedure: Abdominal Aortogram;  Surgeon: Elam Dutch, MD;  Location: [place] CV LAB;  Service: Cardiovascular;  Laterality: N/A;   PERIPHERAL VASCULAR CATHETERIZATION Bilateral 06/14/2015   Procedure: Lower Extremity Angiography;  Surgeon: Elam Dutch, MD;  Location: [place] CV LAB;  Service: Cardiovascular;  Laterality: Bilateral;   VEIN HARVEST Left 07/02/2015   Procedure: VEIN HARVEST - LEFT GREATER SAPPHENOUS VEIN;  Surgeon: Elam Dutch, MD;  Location: [place];  Service: Vascular;  Laterality:  Left;       Family History  Problem Relation Age of Onset   Cancer Mother    Hypertension Mother    Bleeding Disorder Brother     Social History   Tobacco Use   Smoking status: Light Tobacco Smoker    Packs/day: 0.25    Years: 40.00    Pack years: 10.00    Types: Cigarettes    Start date: 03/14/1974   Smokeless tobacco: Never Used   Tobacco comment: 5-6 per day 06/12/15  Substance Use Topics   Alcohol use: Yes    Alcohol/week: 3.0 standard drinks    Types: 3 Cans of beer per week   Drug use: Yes    Types: Marijuana    Comment: 2 days ago     Home Medications Prior to Admission medications   Medication Sig Start Date End Date Taking? Authorizing Provider    amLODipine (NORVASC) 5 MG tablet Take 5 mg by mouth daily. 07/20/16   [provider]  apixaban (ELIQUIS) 5 MG TABS tablet Take 1 tablet (5 mg total) by mouth 2 (two) times daily. 06/16/16   Domenic Polite, MD  aspirin EC 81 MG tablet Take 81 mg by mouth daily.    [provider]  atorvastatin (LIPITOR) 40 MG tablet Take 40 mg by mouth daily.    [provider]  cyclobenzaprine (FLEXERIL) 10 MG tablet Take 1 tablet (10 mg total) by mouth 3 (three) times daily as needed for muscle spasms. 06/12/19   Milton Ferguson, MD  dibucaine (NUPERCAINAL) 1 % OINT Place 1 application rectally as needed for anal irritation. 05/26/19   Margarita Mail, [name]  gabapentin (NEURONTIN) 300 MG capsule Take 1 capsule by mouth 2 (two) times daily. 05/22/19   [provider]  isosorbide mononitrate (IMDUR) 30 MG 24 hr tablet TAKE 1/2 TABLET BY MOUTH ONCE DAILY. Patient taking differently: Take 15 mg by mouth daily.  12/04/15   Herminio Commons, MD  lisinopril (PRINIVIL,ZESTRIL) 40 MG tablet Take 40 mg by mouth daily. 07/20/16   [provider]  metoprolol succinate (TOPROL-XL) 50 MG 24 hr tablet Take 1 tablet by mouth daily. 04/14/19   [provider]  NITROSTAT 0.4 MG SL tablet PLACE 1 TABLET UNDER THE TONGUE AS NEEDED FOR CHEST PAIN UP TO 3 DOSES 10/06/16   Herminio Commons, MD  omeprazole (PRILOSEC) 40 MG capsule Take 40 mg by mouth daily. 07/20/16   [provider]  oxyCODONE-acetaminophen (PERCOCET/ROXICET) 5-325 MG tablet Take 1 tablet by mouth every 6 (six) hours as needed. 09/07/19   Jaidyn Usery P, DO    Allergies    Darvocet [propoxyphene n-acetaminophen] and Propoxyphene  Review of Systems   Review of Systems  Constitutional: Positive for activity change. Negative for chills and fever.  HENT: Negative for congestion and sore throat.   Respiratory: Negative for cough and shortness of breath.   Cardiovascular: Negative for chest pain.   Gastrointestinal: Negative for abdominal pain, nausea and vomiting.       Has colostomy bag. No acute GI issues  Genitourinary: Negative for dysuria.  Musculoskeletal: Positive for gait problem. Negative for joint swelling.  Skin: Negative for rash.    Physical Exam Updated Vital Signs BP (!) 153/109 (BP Location: Right Arm)    Pulse (!) 112    Temp (!) 96.9 F (36.1 C) (Temporal)    Resp 17    Ht 5\' 9"  (1.753 m)    Wt 75.3 kg    SpO2  97%    BMI 24.51 kg/m   Physical Exam Constitutional:      Appearance: Normal appearance. He is normal weight.  HENT:     Head: Normocephalic and atraumatic.  Cardiovascular:     Rate and Rhythm: Normal rate and regular rhythm.     Pulses: Normal pulses.     Heart sounds: Normal heart sounds.  Pulmonary:     Effort: Pulmonary effort is normal.     Breath sounds: Normal breath sounds.  Musculoskeletal:        General: Tenderness present. No swelling or signs of injury.     Right lower leg: No edema.     Left lower leg: No edema.     Comments: Severe pain to palpation along left groin, Able to fully flex and extend but has severe pain with ROM. Internal and external ROM limited due to pain. Antalgic gait favoring the left.   Skin:    General: Skin is warm and dry.     Capillary Refill: Capillary refill takes less than 2 seconds.     Comments: Chronic wound on left buttocks - bandage clean and intact, surrounding skin without erythema, edema or warmth.   Neurological:     Mental Status: He is alert.     ED Results / Procedures / Treatments   Labs (all labs ordered are listed, but only abnormal results are displayed) Labs Reviewed - No data to display  EKG None  Radiology DG HIP UNILAT WITH PELVIS 2-3 VIEWS LEFT  Result Date: 09/07/2019 CLINICAL DATA:  Left hip pain.  No injury. EXAM: DG HIP (WITH OR WITHOUT PELVIS) 2-3V LEFT COMPARISON:  11/22/2018. FINDINGS: Degenerative changes lumbar spine and both hips. No acute bony or joint  abnormality. No evidence of fracture dislocation. Stable bony protuberance noted arising from the left proximal femur. Aortoiliac and peripheral vascular calcification. Pelvic calcifications consistent phleboliths. IMPRESSION: 1. Degenerative changes lumbar spine and both hips. No acute bony abnormality identified. 2.  Aortoiliac and peripheral vascular disease. Electronically Signed   By: Marcello Moores  Register   On: 09/07/2019 13:48    Procedures Procedures (including critical care time)  Medications Ordered in ED Medications  HYDROcodone-acetaminophen (NORCO/VICODIN) 5-325 MG per tablet 2 tablet (2 tablets Oral Given 09/07/19 1416)  dexamethasone (DECADRON) injection 10 mg (10 mg Intramuscular Given 09/07/19 1418)    ED Course  I have reviewed the triage vital signs and the nursing notes.  Pertinent labs & imaging results that were available during my care of the patient were reviewed by me and considered in my medical decision making (see chart for details).  58 y.o. male with known chronic left hip pain presenting with worsening left hip pain that radiates down leg. Most recent x-ray notable for chronic avascular necrosis and psoas tendinosis. Unlikely acute fracture given lack of trauma or falls however given pain is worsening will obtain left x-ray to evaluate. Lower extremities warm and well perfused with intact pulses without any edema or signs of DVT. No systemic symptoms to indicate infection. Will treat pain with Norco and Decadron. Await x-ray results. If stable without fracture, will plan to discharge home with pain medicines and recommendation to see orthopedist for further evaluation and treatment. CSW consulted to help assist in specialist costs.   Left hip x-ray notable for degenerative changes to hips bilaterally without signs of acute fracture or dislocation. Discussed results with patient and recommended follow up. Patient understood and agreed to plan. Few percocet sent to  pharmacy. Patient  instructed to contact PCP to discuss pain control options going further and recommended to see orthopedist to discussed long term treatment options. Patient understood and agreed ot plan. Patient was stable for discharge.     MDM Rules/Calculators/A&P   Final Clinical Impression(s) / ED Diagnoses Final diagnoses:  Left leg pain  Osteoarthritis of left hip, unspecified osteoarthritis type    Rx / DC Orders ED Discharge Orders         Ordered    oxyCODONE-acetaminophen (PERCOCET/ROXICET) 5-325 MG tablet  Every 6 hours PRN     09/07/19 1417           Jo Booze P, DO 09/07/19 1439

## 2019-09-16 DIAGNOSIS — I10 Essential (primary) hypertension: Secondary | ICD-10-CM | POA: Diagnosis not present

## 2019-09-16 DIAGNOSIS — I2510 Atherosclerotic heart disease of native coronary artery without angina pectoris: Secondary | ICD-10-CM | POA: Diagnosis not present

## 2019-09-26 ENCOUNTER — Other Ambulatory Visit (HOSPITAL_COMMUNITY)
Admission: RE | Admit: 2019-09-26 | Discharge: 2019-09-26 | Disposition: A | Discharge status: Home / Self Care | Payer: Medicare HMO | Source: Ambulatory Visit | Attending: Internal Medicine | Admitting: Internal Medicine

## 2019-09-26 DIAGNOSIS — Z0001 Encounter for general adult medical examination with abnormal findings: Secondary | ICD-10-CM | POA: Insufficient documentation

## 2019-09-26 DIAGNOSIS — I10 Essential (primary) hypertension: Secondary | ICD-10-CM | POA: Insufficient documentation

## 2019-09-26 LAB — BASIC METABOLIC PANEL
Anion gap: 10 (ref 5–15)
BUN: 12 mg/dL (ref 6–20)
CO2: 24 mmol/L (ref 22–32)
Calcium: 9.4 mg/dL (ref 8.9–10.3)
Chloride: 106 mmol/L (ref 98–111)
Creatinine, Ser: 1.33 mg/dL — ABNORMAL HIGH (ref 0.61–1.24)
GFR calc Af Amer: >60 mL/min (ref >60)
GFR calc non Af Amer: 58 mL/min — ABNORMAL LOW (ref >60)
Glucose, Bld: 114 mg/dL — ABNORMAL HIGH (ref 70–99)
Potassium: 4.5 mmol/L (ref 3.5–5.1)
Sodium: 140 mmol/L (ref 135–145)

## 2019-09-26 LAB — CBC WITH DIFFERENTIAL/PLATELET
Abs Immature Granulocytes: 0.02 10*3/uL (ref 0.00–0.07)
Basophils Absolute: 0.1 10*3/uL (ref 0.0–0.1)
Basophils Relative: 1 %
Eosinophils Absolute: 0.3 10*3/uL (ref 0.0–0.5)
Eosinophils Relative: 3 %
HCT: 54.1 % — ABNORMAL HIGH (ref 39.0–52.0)
Hemoglobin: 17.7 g/dL — ABNORMAL HIGH (ref 13.0–17.0)
Immature Granulocytes: 0 %
Lymphocytes Relative: 25 %
Lymphs Abs: 2 10*3/uL (ref 0.7–4.0)
MCH: 32.4 pg (ref 26.0–34.0)
MCHC: 32.7 g/dL (ref 30.0–36.0)
MCV: 99.1 fL (ref 80.0–100.0)
Monocytes Absolute: 0.8 10*3/uL (ref 0.1–1.0)
Monocytes Relative: 9 %
Neutro Abs: 5.1 10*3/uL (ref 1.7–7.7)
Neutrophils Relative %: 62 %
Platelets: 233 10*3/uL (ref 150–400)
RBC: 5.46 MIL/uL (ref 4.22–5.81)
RDW: 14.1 % (ref 11.5–15.5)
WBC: 8.3 10*3/uL (ref 4.0–10.5)
nRBC: 0 % (ref 0.0–0.2)

## 2019-09-26 LAB — LIPID PANEL
Cholesterol: 179 mg/dL (ref 0–200)
HDL: 66 mg/dL (ref >40)
LDL Cholesterol: 97 mg/dL (ref 0–99)
Total CHOL/HDL Ratio: 2.7 RATIO
Triglycerides: 78 mg/dL (ref <150)
VLDL: 16 mg/dL (ref 0–40)

## 2019-09-26 LAB — HEPATIC FUNCTION PANEL
ALT: 18 U/L (ref 0–44)
AST: 19 U/L (ref 15–41)
Albumin: 3.9 g/dL (ref 3.5–5.0)
Alkaline Phosphatase: 78 U/L (ref 38–126)
Bilirubin, Direct: 0.1 mg/dL (ref 0.0–0.2)
Indirect Bilirubin: 0.9 mg/dL (ref 0.3–0.9)
Total Bilirubin: 1 mg/dL (ref 0.3–1.2)
Total Protein: 8.3 g/dL — ABNORMAL HIGH (ref 6.5–8.1)

## 2019-10-17 DIAGNOSIS — I10 Essential (primary) hypertension: Secondary | ICD-10-CM | POA: Diagnosis not present

## 2019-10-17 DIAGNOSIS — I2510 Atherosclerotic heart disease of native coronary artery without angina pectoris: Secondary | ICD-10-CM | POA: Diagnosis not present

## 2019-11-17 DIAGNOSIS — I2510 Atherosclerotic heart disease of native coronary artery without angina pectoris: Secondary | ICD-10-CM | POA: Diagnosis not present

## 2019-11-17 DIAGNOSIS — I10 Essential (primary) hypertension: Secondary | ICD-10-CM | POA: Diagnosis not present

## 2019-11-22 DIAGNOSIS — C20 Malignant neoplasm of rectum: Secondary | ICD-10-CM | POA: Diagnosis not present

## 2019-11-22 DIAGNOSIS — Z4801 Encounter for change or removal of surgical wound dressing: Secondary | ICD-10-CM | POA: Diagnosis not present

## 2019-11-22 DIAGNOSIS — Z933 Colostomy status: Secondary | ICD-10-CM | POA: Diagnosis not present

## 2019-11-22 DIAGNOSIS — S31829A Unspecified open wound of left buttock, initial encounter: Secondary | ICD-10-CM | POA: Diagnosis not present

## 2019-11-23 DIAGNOSIS — C20 Malignant neoplasm of rectum: Secondary | ICD-10-CM | POA: Diagnosis not present

## 2019-11-23 DIAGNOSIS — Z4801 Encounter for change or removal of surgical wound dressing: Secondary | ICD-10-CM | POA: Diagnosis not present

## 2019-11-23 DIAGNOSIS — S31829A Unspecified open wound of left buttock, initial encounter: Secondary | ICD-10-CM | POA: Diagnosis not present

## 2019-11-23 DIAGNOSIS — Z933 Colostomy status: Secondary | ICD-10-CM | POA: Diagnosis not present

## 2019-11-24 DIAGNOSIS — C20 Malignant neoplasm of rectum: Secondary | ICD-10-CM | POA: Diagnosis not present

## 2019-11-24 DIAGNOSIS — Z4801 Encounter for change or removal of surgical wound dressing: Secondary | ICD-10-CM | POA: Diagnosis not present

## 2019-11-24 DIAGNOSIS — Z933 Colostomy status: Secondary | ICD-10-CM | POA: Diagnosis not present

## 2019-11-24 DIAGNOSIS — S31829A Unspecified open wound of left buttock, initial encounter: Secondary | ICD-10-CM | POA: Diagnosis not present

## 2019-12-06 DIAGNOSIS — Z0001 Encounter for general adult medical examination with abnormal findings: Secondary | ICD-10-CM | POA: Diagnosis not present

## 2019-12-06 DIAGNOSIS — F17210 Nicotine dependence, cigarettes, uncomplicated: Secondary | ICD-10-CM | POA: Diagnosis not present

## 2019-12-06 DIAGNOSIS — Z933 Colostomy status: Secondary | ICD-10-CM | POA: Diagnosis not present

## 2019-12-06 DIAGNOSIS — Z1389 Encounter for screening for other disorder: Secondary | ICD-10-CM | POA: Diagnosis not present

## 2019-12-06 DIAGNOSIS — K603 Anal fistula: Secondary | ICD-10-CM | POA: Diagnosis not present

## 2019-12-06 DIAGNOSIS — I10 Essential (primary) hypertension: Secondary | ICD-10-CM | POA: Diagnosis not present

## 2020-01-06 DIAGNOSIS — I2510 Atherosclerotic heart disease of native coronary artery without angina pectoris: Secondary | ICD-10-CM | POA: Diagnosis not present

## 2020-01-06 DIAGNOSIS — M5489 Other dorsalgia: Secondary | ICD-10-CM | POA: Diagnosis not present

## 2020-01-15 DIAGNOSIS — Z4801 Encounter for change or removal of surgical wound dressing: Secondary | ICD-10-CM | POA: Diagnosis not present

## 2020-01-15 DIAGNOSIS — Z933 Colostomy status: Secondary | ICD-10-CM | POA: Diagnosis not present

## 2020-01-15 DIAGNOSIS — C20 Malignant neoplasm of rectum: Secondary | ICD-10-CM | POA: Diagnosis not present

## 2020-01-15 DIAGNOSIS — S31829A Unspecified open wound of left buttock, initial encounter: Secondary | ICD-10-CM | POA: Diagnosis not present

## 2020-01-29 DIAGNOSIS — Z4801 Encounter for change or removal of surgical wound dressing: Secondary | ICD-10-CM | POA: Diagnosis not present

## 2020-01-29 DIAGNOSIS — S31829A Unspecified open wound of left buttock, initial encounter: Secondary | ICD-10-CM | POA: Diagnosis not present

## 2020-01-29 DIAGNOSIS — C20 Malignant neoplasm of rectum: Secondary | ICD-10-CM | POA: Diagnosis not present

## 2020-01-29 DIAGNOSIS — Z933 Colostomy status: Secondary | ICD-10-CM | POA: Diagnosis not present

## 2020-02-05 DIAGNOSIS — I82401 Acute embolism and thrombosis of unspecified deep veins of right lower extremity: Secondary | ICD-10-CM | POA: Diagnosis not present

## 2020-02-05 DIAGNOSIS — I2510 Atherosclerotic heart disease of native coronary artery without angina pectoris: Secondary | ICD-10-CM | POA: Diagnosis not present

## 2020-03-05 ENCOUNTER — Other Ambulatory Visit: Payer: Self-pay

## 2020-03-05 ENCOUNTER — Inpatient Hospital Stay (HOSPITAL_COMMUNITY): Payer: Medicare Other | Attending: Hematology

## 2020-03-05 DIAGNOSIS — Z933 Colostomy status: Secondary | ICD-10-CM | POA: Insufficient documentation

## 2020-03-05 DIAGNOSIS — N189 Chronic kidney disease, unspecified: Secondary | ICD-10-CM | POA: Diagnosis not present

## 2020-03-05 DIAGNOSIS — C20 Malignant neoplasm of rectum: Secondary | ICD-10-CM | POA: Diagnosis not present

## 2020-03-05 DIAGNOSIS — K6289 Other specified diseases of anus and rectum: Secondary | ICD-10-CM | POA: Diagnosis not present

## 2020-03-05 DIAGNOSIS — K621 Rectal polyp: Secondary | ICD-10-CM | POA: Insufficient documentation

## 2020-03-05 LAB — CBC WITH DIFFERENTIAL/PLATELET
Abs Immature Granulocytes: 0.02 10*3/uL (ref 0.00–0.07)
Basophils Absolute: 0 10*3/uL (ref 0.0–0.1)
Basophils Relative: 0 %
Eosinophils Absolute: 0.1 10*3/uL (ref 0.0–0.5)
Eosinophils Relative: 1 %
HCT: 53.6 % — ABNORMAL HIGH (ref 39.0–52.0)
Hemoglobin: 17.9 g/dL — ABNORMAL HIGH (ref 13.0–17.0)
Immature Granulocytes: 0 %
Lymphocytes Relative: 16 %
Lymphs Abs: 1.7 10*3/uL (ref 0.7–4.0)
MCH: 32.8 pg (ref 26.0–34.0)
MCHC: 33.4 g/dL (ref 30.0–36.0)
MCV: 98.2 fL (ref 80.0–100.0)
Monocytes Absolute: 0.6 10*3/uL (ref 0.1–1.0)
Monocytes Relative: 6 %
Neutro Abs: 8 10*3/uL — ABNORMAL HIGH (ref 1.7–7.7)
Neutrophils Relative %: 77 %
Platelets: 214 10*3/uL (ref 150–400)
RBC: 5.46 MIL/uL (ref 4.22–5.81)
RDW: 14.3 % (ref 11.5–15.5)
WBC: 10.4 10*3/uL (ref 4.0–10.5)
nRBC: 0 % (ref 0.0–0.2)

## 2020-03-05 LAB — COMPREHENSIVE METABOLIC PANEL
ALT: 20 U/L (ref 0–44)
AST: 23 U/L (ref 15–41)
Albumin: 3.8 g/dL (ref 3.5–5.0)
Alkaline Phosphatase: 70 U/L (ref 38–126)
Anion gap: 13 (ref 5–15)
BUN: 17 mg/dL (ref 6–20)
CO2: 20 mmol/L — ABNORMAL LOW (ref 22–32)
Calcium: 9.4 mg/dL (ref 8.9–10.3)
Chloride: 103 mmol/L (ref 98–111)
Creatinine, Ser: 1.67 mg/dL — ABNORMAL HIGH (ref 0.61–1.24)
GFR calc Af Amer: 51 mL/min — ABNORMAL LOW (ref >60)
GFR calc non Af Amer: 44 mL/min — ABNORMAL LOW (ref >60)
Glucose, Bld: 204 mg/dL — ABNORMAL HIGH (ref 70–99)
Potassium: 4.3 mmol/L (ref 3.5–5.1)
Sodium: 136 mmol/L (ref 135–145)
Total Bilirubin: 0.7 mg/dL (ref 0.3–1.2)
Total Protein: 7.4 g/dL (ref 6.5–8.1)

## 2020-03-06 LAB — CEA: CEA: 11.4 ng/mL — ABNORMAL HIGH (ref 0.0–4.7)

## 2020-03-08 DIAGNOSIS — I2510 Atherosclerotic heart disease of native coronary artery without angina pectoris: Secondary | ICD-10-CM | POA: Diagnosis not present

## 2020-03-08 DIAGNOSIS — I10 Essential (primary) hypertension: Secondary | ICD-10-CM | POA: Diagnosis not present

## 2020-03-12 ENCOUNTER — Inpatient Hospital Stay (HOSPITAL_BASED_OUTPATIENT_CLINIC_OR_DEPARTMENT_OTHER): Payer: Medicare Other | Admitting: Nurse Practitioner

## 2020-03-12 ENCOUNTER — Ambulatory Visit (HOSPITAL_COMMUNITY): Payer: Medicare HMO | Admitting: Nurse Practitioner

## 2020-03-12 ENCOUNTER — Other Ambulatory Visit: Payer: Self-pay

## 2020-03-12 VITALS — BP 106/72 | HR 101 | Temp 98.4°F | Resp 18 | Wt 169.8 lb

## 2020-03-12 DIAGNOSIS — Z933 Colostomy status: Secondary | ICD-10-CM | POA: Diagnosis not present

## 2020-03-12 DIAGNOSIS — N189 Chronic kidney disease, unspecified: Secondary | ICD-10-CM | POA: Diagnosis not present

## 2020-03-12 DIAGNOSIS — C20 Malignant neoplasm of rectum: Secondary | ICD-10-CM | POA: Diagnosis not present

## 2020-03-12 DIAGNOSIS — K6289 Other specified diseases of anus and rectum: Secondary | ICD-10-CM | POA: Diagnosis not present

## 2020-03-12 DIAGNOSIS — K621 Rectal polyp: Secondary | ICD-10-CM | POA: Diagnosis not present

## 2020-03-12 NOTE — Assessment & Plan Note (Signed)
1.  Stage IIa rectal adenocarcinoma: -Xeloda plus radiation therapy from 02/03/2011 through 03/13/2011. -LAR by Dr. Cheron Schaumann, pathology showing YPT2Y PN0. -Xeloda was recommended but was not done secondary to pain reasons. -Colostomy revision for stomal prolapse in January 2020 by Dr. Cheron Schaumann. -Last CTAP on 01/20/2018 was negative for metastatic disease. -Labs done on 03/05/2020 showed hemoglobin 17.9, WBC 10.4, platelets 214, creatinine 1.67, CEA 11.4. -Last CEA level was in 2018 it was 4.1. -He also reports his rectal pain has increased.  And he is seeing spots of blood in his colostomy bag. -We will order a CT AP and follow-up afterwards.  We will also refer him to GI.  2.  CKD: -Creatinine on 03/05/2020 was 1.67 -We will continue to monitor

## 2020-03-12 NOTE — Patient Instructions (Signed)
[place] at [facility] Discharge Instructions  Follow up after Ct scans    Thank you for choosing [place] at [facility] to provide your oncology and hematology care.  To afford each patient quality time with our provider, please arrive at least 15 minutes before your scheduled appointment time.   If you have a lab appointment with the [facility] please come in thru the Main Entrance and check in at the main information desk.  You need to re-schedule your appointment should you arrive 10 or more minutes late.  We strive to give you quality time with our providers, and arriving late affects you and other patients whose appointments are after yours.  Also, if you no show three or more times for appointments you may be dismissed from the clinic at the providers discretion.     Again, thank you for choosing [facility].  Our hope is that these requests will decrease the amount of time that you wait before being seen by our physicians.       _____________________________________________________________  Should you have questions after your visit to [facility], please contact our office at [phone] between the hours of 8:00 a.m. and 4:30 p.m.  Voicemails left after 4:00 p.m. will not be returned until the following business day.  For prescription refill requests, have your pharmacy contact our office and allow 72 hours.    Due to Covid, you will need to wear a mask upon entering the hospital. If you do not have a mask, a mask will be given to you at the Main Entrance upon arrival. For doctor visits, patients may have 1 support person with them. For treatment visits, patients can not have anyone with them due to social distancing guidelines and our immunocompromised population.
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
## 2020-03-14 DIAGNOSIS — S31829A Unspecified open wound of left buttock, initial encounter: Secondary | ICD-10-CM | POA: Diagnosis not present

## 2020-03-14 DIAGNOSIS — Z4801 Encounter for change or removal of surgical wound dressing: Secondary | ICD-10-CM | POA: Diagnosis not present

## 2020-03-14 DIAGNOSIS — C20 Malignant neoplasm of rectum: Secondary | ICD-10-CM | POA: Diagnosis not present

## 2020-03-14 DIAGNOSIS — Z933 Colostomy status: Secondary | ICD-10-CM | POA: Diagnosis not present

## 2020-04-01 ENCOUNTER — Encounter (HOSPITAL_COMMUNITY): Payer: Self-pay | Admitting: *Deleted

## 2020-04-01 ENCOUNTER — Emergency Department (HOSPITAL_COMMUNITY): Payer: Medicare Other

## 2020-04-01 ENCOUNTER — Emergency Department (HOSPITAL_COMMUNITY)
Admission: EM | Admit: 2020-04-01 | Discharge: 2020-04-01 | Disposition: A | Discharge status: Home / Self Care | Payer: Medicare Other | Attending: Emergency Medicine | Admitting: Emergency Medicine

## 2020-04-01 ENCOUNTER — Other Ambulatory Visit: Payer: Self-pay

## 2020-04-01 DIAGNOSIS — I480 Paroxysmal atrial fibrillation: Secondary | ICD-10-CM | POA: Diagnosis not present

## 2020-04-01 DIAGNOSIS — W010XXA Fall on same level from slipping, tripping and stumbling without subsequent striking against object, initial encounter: Secondary | ICD-10-CM | POA: Insufficient documentation

## 2020-04-01 DIAGNOSIS — Z85048 Personal history of other malignant neoplasm of rectum, rectosigmoid junction, and anus: Secondary | ICD-10-CM | POA: Insufficient documentation

## 2020-04-01 DIAGNOSIS — F17210 Nicotine dependence, cigarettes, uncomplicated: Secondary | ICD-10-CM | POA: Insufficient documentation

## 2020-04-01 DIAGNOSIS — I2510 Atherosclerotic heart disease of native coronary artery without angina pectoris: Secondary | ICD-10-CM | POA: Diagnosis not present

## 2020-04-01 DIAGNOSIS — Y939 Activity, unspecified: Secondary | ICD-10-CM | POA: Insufficient documentation

## 2020-04-01 DIAGNOSIS — Y999 Unspecified external cause status: Secondary | ICD-10-CM | POA: Diagnosis not present

## 2020-04-01 DIAGNOSIS — Z86718 Personal history of other venous thrombosis and embolism: Secondary | ICD-10-CM | POA: Diagnosis not present

## 2020-04-01 DIAGNOSIS — S63501A Unspecified sprain of right wrist, initial encounter: Secondary | ICD-10-CM | POA: Diagnosis not present

## 2020-04-01 DIAGNOSIS — Z79899 Other long term (current) drug therapy: Secondary | ICD-10-CM | POA: Diagnosis not present

## 2020-04-01 DIAGNOSIS — I10 Essential (primary) hypertension: Secondary | ICD-10-CM | POA: Diagnosis not present

## 2020-04-01 DIAGNOSIS — S6991XA Unspecified injury of right wrist, hand and finger(s), initial encounter: Secondary | ICD-10-CM | POA: Diagnosis not present

## 2020-04-01 DIAGNOSIS — Z7982 Long term (current) use of aspirin: Secondary | ICD-10-CM | POA: Diagnosis not present

## 2020-04-01 DIAGNOSIS — Y929 Unspecified place or not applicable: Secondary | ICD-10-CM | POA: Diagnosis not present

## 2020-04-01 DIAGNOSIS — Z7901 Long term (current) use of anticoagulants: Secondary | ICD-10-CM | POA: Insufficient documentation

## 2020-04-01 MED ORDER — HYDROCODONE-ACETAMINOPHEN 5-325 MG PO TABS: ORAL_TABLET | ORAL | 0 refills | Status: DC

## 2020-04-01 NOTE — ED Provider Notes (Signed)
[facility] EMERGENCY DEPARTMENT Provider Note   CSN: [id number] Arrival date & time: 04/01/20  4098     History Chief Complaint  Patient presents with  . Wrist Pain    Juan Wheeler is a 59 y.o. male.  HPI      Juan Wheeler is a 59 y.o. male with past medical history of coronary artery disease, rectal cancer, paroxysmal atrial fibrillation who is anticoagulated on Eliquis.  He presents to the Emergency Department complaining of right wrist pain secondary to a mechanical fall that occurred yesterday.  He states that he tripped and fell forward landing on outstretched right hand.  He complains of swelling of his wrist and pain associated with movement.  He describes a constant throbbing pain to his wrist that kept him from sleeping last evening.  He has not tried any therapies prior to arrival.  He denies other injuries as result of the fall.  He denies head injury, LOC, numbness or weakness of the extremity, headache, neck pain, back pain, or dizziness.  No symptoms prior to the fall.  Past Medical History:  Diagnosis Date  . Chronic lower back pain    a. Followed by pain management at [facility].  . Colon cancer [facility])    rectal cancer  . Coronary artery disease    a. 03/2013: abnl nuc -> LHC s/p DES to LCx, residual moderate disease in LAD (med rx unless refractory angina). b. Not on BB due to bradycardia.  Marland Kitchen DVT (deep venous thrombosis) ([place]) ~ 2013  . Dysrhythmia    AFib  . GERD (gastroesophageal reflux disease)   . H/O necrotising fasciitis   . History of blood transfusion    "once; after throwing up alot of blood" (04/17/2013)  . Hypertension   . LV dysfunction    a. EF 45% in 03/2013.  Marland Kitchen PAD (peripheral artery disease) ([place])    a. Occlusion of the right internal iliac artery, with significant atherosclerosis in the left internal iliac which was not amenable to reconstruction per notes from [facility].  . Pulmonary nodules 09/30/2014  . Rectal cancer ([place])   .  Tobacco abuse     Patient Active Problem List   Diagnosis Date Noted  . History of DVT (deep vein thrombosis) 07/08/2016  . Paroxysmal atrial fibrillation ([place]) 07/08/2016  . SOB (shortness of breath) 07/08/2016  . Sepsis secondary to UTI ([place]) 07/08/2016  . Hypokalemia   . Gastroesophageal reflux disease   . Coronary artery disease involving native coronary artery of native heart with angina pectoris ([place])   . AKI (acute kidney injury) ([place])   . Fournier's gangrene in male 06/05/2016  . Abscess 06/05/2016  . Sepsis ([place])   . Necrotizing fasciitis ([place])   . Central venous catheter in place   . Acute respiratory failure ([place])   . PAD (peripheral artery disease) ([place]) 07/02/2015  . Preoperative cardiovascular examination 06/12/2015  . Cardiomyopathy, ischemic 06/12/2015  . Pulmonary nodules 09/30/2014  . ASCVD (arteriosclerotic cardiovascular disease) 05/02/2013  . Unstable angina ([place]) 04/18/2013  . Chest pain 03/29/2013  . Tobacco abuse 03/29/2013  . Chronic pain syndrome 11/09/2012  . Pain in joint, pelvic region and thigh 11/09/2012  . Neuralgia and neuritis 10/12/2012  . Atherosclerosis of native arteries of extremity with intermittent claudication ([place]) 08/19/2012  . Backache 03/24/2012  . Peripheral vascular disease ([place]) 12/04/2011  . Compression of vein 12/04/2011  . Heartburn 12/03/2011  . Personal history of digestive disease 11/19/2011  . Depressive  disorder 09/24/2011  . Dysuria 08/31/2011  . Impotence of organic origin 08/31/2011  . Urinary frequency 08/31/2011  . Constipation 06/05/2011  . Essential hypertension 06/05/2011  . Malignant neoplasm of rectum ([place]) 12/02/2010    Past Surgical History:  Procedure Laterality Date  . ABDOMINAL SURGERY  1990's   'for stomach ulcers" (04/17/2013)  . COLECTOMY  2012   "for rectal cancer" (04/17/2013)  . COLONOSCOPY  2013   Dr. Cheryll Cockayne: colorectal anastomosis with ulcer and inflammation, benign biopsy  .  COLONOSCOPY WITH PROPOFOL N/A 09/07/2016   Procedure: COLONOSCOPY WITH PROPOFOL;  Surgeon: Daneil Dolin, MD;  Location[facility];  Service: Endoscopy;  Laterality: N/A;  10:00 am Colonoscopy via rectum and ostomy  . COLOSTOMY TAKEDOWN  2013  . CORONARY ANGIOPLASTY WITH STENT PLACEMENT  04/17/2013   "?1" (04/17/2013)  . FEMORAL-POPLITEAL BYPASS GRAFT Left 07/02/2015   Procedure: BYPASS GRAFT LEFT COMMON FEMORAL ARTERY TO LEFT ABOVE KNEE POPLITEAL ARTERY - USING LEFT GREATER SAPPHENOUS VEIN;  Surgeon: Elam Dutch, MD;  Location: [place];  Service: Vascular;  Laterality: Left;  . INCISION AND DRAINAGE ABSCESS Left 06/05/2016   Procedure: INCISION AND DRAINAGE ABSCESS;  Surgeon: Aviva Signs, MD;  Location: [facility] ORS;  Service: General;  Laterality: Left;  . INCISION AND DRAINAGE PERIRECTAL ABSCESS Left 06/07/2016   Procedure: IRRIGATION AND DEBRIDEMENT LEFT BUTTOCK ABSCESS;  Surgeon: Greer Pickerel, MD;  Location: [place];  Service: General;  Laterality: Left;  . INGUINAL HERNIA REPAIR Bilateral 1990's  . LAPAROSCOPIC PARTIAL COLECTOMY N/A 06/11/2016   Procedure: LAPAROSCOPIC  OPEN COLOSTOMY;  Surgeon: [name], MD;  Location: [place];  Service: General;  Laterality: N/A;  . LEFT HEART CATHETERIZATION WITH CORONARY ANGIOGRAM N/A 04/17/2013   Procedure: LEFT HEART CATHETERIZATION WITH CORONARY ANGIOGRAM;  Surgeon: Peter M Martinique, MD;  Location: [facility] CATH LAB;  Service: Cardiovascular;  Laterality: N/A;  . PERCUTANEOUS STENT INTERVENTION  04/17/2013   Procedure: PERCUTANEOUS STENT INTERVENTION;  Surgeon: Peter M Martinique, MD;  Location: [facility] CATH LAB;  Service: Cardiovascular;;  . PERIPHERAL VASCULAR CATHETERIZATION N/A 06/14/2015   Procedure: Abdominal Aortogram;  Surgeon: Elam Dutch, MD;  Location: [place] CV LAB;  Service: Cardiovascular;  Laterality: N/A;  . PERIPHERAL VASCULAR CATHETERIZATION Bilateral 06/14/2015   Procedure: Lower Extremity Angiography;  Surgeon: Elam Dutch, MD;   Location: [place] CV LAB;  Service: Cardiovascular;  Laterality: Bilateral;  . VEIN HARVEST Left 07/02/2015   Procedure: VEIN HARVEST - LEFT GREATER SAPPHENOUS VEIN;  Surgeon: Elam Dutch, MD;  Location: [facility] OR;  Service: Vascular;  Laterality: Left;       Family History  Problem Relation Age of Onset  . Cancer Mother   . Hypertension Mother   . Bleeding Disorder Brother     Social History   Tobacco Use  . Smoking status: Light Tobacco Smoker    Packs/day: 0.25    Years: 40.00    Pack years: 10.00    Types: Cigarettes    Start date: 03/14/1974  . Smokeless tobacco: Never Used  . Tobacco comment: 5-6 per day 06/12/15  Substance Use Topics  . Alcohol use: Yes    Alcohol/week: 3.0 standard drinks    Types: 3 Cans of beer per week  . Drug use: Yes    Types: Marijuana    Comment: 2 days ago     Home Medications Prior to Admission medications   Medication Sig Start Date End Date Taking? Authorizing Provider  amLODipine (NORVASC) 5 MG  tablet Take 5 mg by mouth daily. 07/20/16   [provider]  apixaban (ELIQUIS) 5 MG TABS tablet Take 1 tablet (5 mg total) by mouth 2 (two) times daily. 06/16/16   Domenic Polite, MD  aspirin EC 81 MG tablet Take 81 mg by mouth daily.    [provider]  atorvastatin (LIPITOR) 40 MG tablet Take 40 mg by mouth daily.    [provider]  cyclobenzaprine (FLEXERIL) 10 MG tablet Take 1 tablet (10 mg total) by mouth 3 (three) times daily as needed for muscle spasms. Patient not taking: Reported on 03/12/2020 06/12/19   Milton Ferguson, MD  dibucaine (NUPERCAINAL) 1 % OINT Place 1 application rectally as needed for anal irritation. Patient not taking: Reported on 03/12/2020 05/26/19   Margarita Mail, [name]  gabapentin (NEURONTIN) 300 MG capsule Take 1 capsule by mouth 2 (two) times daily. 05/22/19   [provider]  isosorbide mononitrate (IMDUR) 30 MG 24 hr tablet TAKE 1/2 TABLET BY MOUTH ONCE DAILY. Patient taking  differently: Take 15 mg by mouth daily.  12/04/15   Herminio Commons, MD  lisinopril (PRINIVIL,ZESTRIL) 40 MG tablet Take 40 mg by mouth daily. 07/20/16   [provider]  metoprolol succinate (TOPROL-XL) 50 MG 24 hr tablet Take 1 tablet by mouth daily. 04/14/19   [provider]  NITROSTAT 0.4 MG SL tablet PLACE 1 TABLET UNDER THE TONGUE AS NEEDED FOR CHEST PAIN UP TO 3 DOSES Patient not taking: Reported on 03/12/2020 10/06/16   Herminio Commons, MD  omeprazole (PRILOSEC) 40 MG capsule Take 40 mg by mouth daily. 07/20/16   [provider]    Allergies    Darvocet [propoxyphene n-acetaminophen] and Propoxyphene  Review of Systems   Review of Systems  Constitutional: Negative for chills and fever.  Eyes: Negative for visual disturbance.  Cardiovascular: Negative for chest pain.  Gastrointestinal: Negative for nausea and vomiting.  Musculoskeletal: Positive for arthralgias (Right wrist pain and swelling). Negative for back pain, joint swelling and neck pain.  Skin: Negative for color change and wound.  Neurological: Negative for dizziness, syncope, facial asymmetry, weakness, numbness and headaches.    Physical Exam Updated Vital Signs BP (!) 143/97 (BP Location: Left Arm)   Pulse 93   Temp 98.9 F (37.2 C) (Oral)   Resp 18   Ht 5\' 9"  (1.753 m)   Wt 81.2 kg   SpO2 94%   BMI 26.43 kg/m   Physical Exam Vitals and nursing note reviewed.  Constitutional:      Appearance: Normal appearance. He is not ill-appearing or toxic-appearing.  HENT:     Head: Atraumatic. No raccoon eyes, Battle's sign or abrasion.     Mouth/Throat:     Mouth: Mucous membranes are moist.     Pharynx: Oropharynx is clear.  Eyes:     Conjunctiva/sclera: Conjunctivae normal.     Pupils: Pupils are equal, round, and reactive to light.  Cardiovascular:     Rate and Rhythm: Normal rate and regular rhythm.     Pulses: Normal pulses.  Pulmonary:     Effort: Pulmonary effort is  normal.     Breath sounds: Normal breath sounds.  Chest:     Chest wall: No tenderness.  Musculoskeletal:        General: Swelling, tenderness and signs of injury present.     Right wrist: Swelling, tenderness and snuff box tenderness present. No lacerations or bony tenderness. Decreased range of motion.     Cervical  back: Normal range of motion. No tenderness.     Comments: Tenderness to palpation of the radial aspect of the right wrist.  Mild to moderate tenderness at the anatomical snuffbox.  Edema noted at the distal wrist.  No bony deformities or open wounds.  No proximal tenderness.  Skin:    Capillary Refill: Capillary refill takes less than 2 seconds.     Findings: No bruising or erythema.  Neurological:     General: No focal deficit present.     Mental Status: He is alert.     Sensory: No sensory deficit.     Motor: No weakness.     ED Results / Procedures / Treatments   Labs (all labs ordered are listed, but only abnormal results are displayed) Labs Reviewed - No data to display  EKG None  Radiology DG Wrist Complete Right  Result Date: 04/01/2020 CLINICAL DATA:  Fall, wrist pain EXAM: RIGHT WRIST - COMPLETE 3+ VIEW COMPARISON:  None. FINDINGS: There is no evidence of fracture or dislocation. There is no evidence of arthropathy or other focal bone abnormality. Soft tissues are unremarkable. IMPRESSION: No acute osseous finding Electronically Signed   By: Jerilynn Mages.  Shick M.D.   On: 04/01/2020 12:09    Procedures Procedures (including critical care time)  Medications Ordered in ED Medications - No data to display  ED Course  I have reviewed the triage vital signs and the nursing notes.  Pertinent labs & imaging results that were available during my care of the patient were reviewed by me and considered in my medical decision making (see chart for details).    MDM Rules/Calculators/A&P                          Patient here with swelling and pain of the right wrist  secondary to mechanical fall that occurred last evening.  Reports falling on an outstretched hand.  X-ray is negative for acute bony injury.  Neurovascularly intact.  No open wounds.  Patient does take anticoagulant, but adamant there was no injury of the neck,back, or head.  No LOC.  velcro wrist splint applied, pain improved.  Discussed possible ligamentous injury and need for close orthopedic follow-up if not improving.    Final Clinical Impression(s) / ED Diagnoses Final diagnoses:  Sprain of right wrist, initial encounter    Rx / DC Orders ED Discharge Orders    None       Kem Parkinson, [name] 04/01/20 1816    Isla Pence, MD 04/02/20 [phone]

## 2020-04-01 NOTE — ED Triage Notes (Signed)
Pain in right wrist and hand, fell yesterday

## 2020-04-01 NOTE — Discharge Instructions (Addendum)
Elevate and apply ice packs on and off to your wrist.  Avoid excessive use of your right hand.  Call the orthopedic provider listed in a few days to arrange a follow-up appointment if not improving.

## 2020-04-03 ENCOUNTER — Other Ambulatory Visit: Payer: Self-pay

## 2020-04-03 ENCOUNTER — Ambulatory Visit (HOSPITAL_COMMUNITY)
Admission: RE | Admit: 2020-04-03 | Discharge: 2020-04-03 | Disposition: A | Discharge status: Home / Self Care | Payer: Medicare Other | Source: Ambulatory Visit | Attending: Nurse Practitioner | Admitting: Nurse Practitioner

## 2020-04-03 DIAGNOSIS — N3289 Other specified disorders of bladder: Secondary | ICD-10-CM | POA: Diagnosis not present

## 2020-04-03 DIAGNOSIS — K921 Melena: Secondary | ICD-10-CM | POA: Diagnosis not present

## 2020-04-03 DIAGNOSIS — C20 Malignant neoplasm of rectum: Secondary | ICD-10-CM | POA: Diagnosis not present

## 2020-04-03 MED ORDER — IOHEXOL 300 MG/ML  SOLN
80.0000 mL | Freq: Once | INTRAMUSCULAR | Status: AC | PRN
  Administered 2020-04-03: 100 mL via INTRAVENOUS

## 2020-04-07 DIAGNOSIS — I2510 Atherosclerotic heart disease of native coronary artery without angina pectoris: Secondary | ICD-10-CM | POA: Diagnosis not present

## 2020-04-07 DIAGNOSIS — I10 Essential (primary) hypertension: Secondary | ICD-10-CM | POA: Diagnosis not present

## 2020-04-10 ENCOUNTER — Other Ambulatory Visit: Payer: Self-pay

## 2020-04-10 ENCOUNTER — Inpatient Hospital Stay (HOSPITAL_COMMUNITY): Payer: Medicare Other | Attending: Hematology | Admitting: Hematology

## 2020-04-10 VITALS — BP 103/70 | HR 103 | Temp 97.3°F | Resp 18 | Wt 166.1 lb

## 2020-04-10 DIAGNOSIS — F17210 Nicotine dependence, cigarettes, uncomplicated: Secondary | ICD-10-CM | POA: Insufficient documentation

## 2020-04-10 DIAGNOSIS — N189 Chronic kidney disease, unspecified: Secondary | ICD-10-CM | POA: Insufficient documentation

## 2020-04-10 DIAGNOSIS — C20 Malignant neoplasm of rectum: Secondary | ICD-10-CM | POA: Insufficient documentation

## 2020-04-10 DIAGNOSIS — M87052 Idiopathic aseptic necrosis of left femur: Secondary | ICD-10-CM | POA: Diagnosis not present
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
## 2020-04-10 NOTE — Patient Instructions (Signed)
[place] at [facility] Discharge Instructions  You were seen today by Dr. Delton Coombes. He went over your recent results. You will be scheduled for a PET scan of your body. Dr. Delton Coombes will see you back after your PET scan for labs and follow up.   Thank you for choosing [place] at [facility] to provide your oncology and hematology care.  To afford each patient quality time with our provider, please arrive at least 15 minutes before your scheduled appointment time.   If you have a lab appointment with the [facility] please come in thru the Main Entrance and check in at the main information desk  You need to re-schedule your appointment should you arrive 10 or more minutes late.  We strive to give you quality time with our providers, and arriving late affects you and other patients whose appointments are after yours.  Also, if you no show three or more times for appointments you may be dismissed from the clinic at the providers discretion.     Again, thank you for choosing [facility].  Our hope is that these requests will decrease the amount of time that you wait before being seen by our physicians.       _____________________________________________________________  Should you have questions after your visit to [facility], please contact our office at [phone] between the hours of 8:00 a.m. and 4:30 p.m.  Voicemails left after 4:00 p.m. will not be returned until the following business day.  For prescription refill requests, have your pharmacy contact our office and allow 72 hours.    Cancer Center Support Programs:   > Cancer Support Group  2nd Tuesday of the month 1pm-2pm, Journey Room

## 2020-04-29 ENCOUNTER — Other Ambulatory Visit: Payer: Self-pay

## 2020-04-29 ENCOUNTER — Ambulatory Visit (HOSPITAL_COMMUNITY)
Admission: RE | Admit: 2020-04-29 | Discharge: 2020-04-29 | Disposition: A | Discharge status: Home / Self Care | Payer: Medicare Other | Source: Ambulatory Visit | Attending: Hematology | Admitting: Hematology

## 2020-04-29 DIAGNOSIS — K6289 Other specified diseases of anus and rectum: Secondary | ICD-10-CM | POA: Diagnosis not present

## 2020-04-29 DIAGNOSIS — J432 Centrilobular emphysema: Secondary | ICD-10-CM | POA: Diagnosis not present

## 2020-04-29 DIAGNOSIS — C20 Malignant neoplasm of rectum: Secondary | ICD-10-CM | POA: Diagnosis not present

## 2020-04-29 DIAGNOSIS — I2510 Atherosclerotic heart disease of native coronary artery without angina pectoris: Secondary | ICD-10-CM | POA: Diagnosis not present

## 2020-04-29 DIAGNOSIS — C218 Malignant neoplasm of overlapping sites of rectum, anus and anal canal: Secondary | ICD-10-CM | POA: Diagnosis not present

## 2020-04-29 MED ORDER — FLUDEOXYGLUCOSE F - 18 (FDG) INJECTION
9.6100 | Freq: Once | INTRAVENOUS | Status: AC | PRN
  Administered 2020-04-29: 9.61 via INTRAVENOUS

## 2020-05-01 ENCOUNTER — Inpatient Hospital Stay (HOSPITAL_COMMUNITY): Payer: Medicare Other | Attending: Hematology | Admitting: Hematology

## 2020-05-01 VITALS — BP 149/95 | HR 91 | Temp 97.2°F | Resp 18 | Wt 166.1 lb

## 2020-05-01 DIAGNOSIS — R918 Other nonspecific abnormal finding of lung field: Secondary | ICD-10-CM | POA: Insufficient documentation

## 2020-05-01 DIAGNOSIS — F17210 Nicotine dependence, cigarettes, uncomplicated: Secondary | ICD-10-CM | POA: Insufficient documentation

## 2020-05-01 DIAGNOSIS — N189 Chronic kidney disease, unspecified: Secondary | ICD-10-CM | POA: Diagnosis not present

## 2020-05-01 DIAGNOSIS — M25552 Pain in left hip: Secondary | ICD-10-CM | POA: Diagnosis not present

## 2020-05-01 DIAGNOSIS — C20 Malignant neoplasm of rectum: Secondary | ICD-10-CM | POA: Diagnosis not present

## 2020-05-01 DIAGNOSIS — M545 Low back pain: Secondary | ICD-10-CM | POA: Insufficient documentation

## 2020-05-01 DIAGNOSIS — R200 Anesthesia of skin: Secondary | ICD-10-CM | POA: Insufficient documentation

## 2020-05-01 MED ORDER — TRAMADOL HCL 50 MG PO TABS: 50.0000 mg | ORAL_TABLET | Freq: Four times a day (QID) | ORAL | 0 refills | Status: DC | PRN
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
## 2020-05-01 NOTE — Patient Instructions (Signed)
[place] at [facility] Discharge Instructions  You were seen today by Dr. Delton Coombes. He went over your recent results. You will be scheduled with interventional radiology to have a biopsy on your right lung. Dr. Delton Coombes will see you back after your biopsy returns for follow up.   Thank you for choosing [place] at [facility] to provide your oncology and hematology care.  To afford each patient quality time with our provider, please arrive at least 15 minutes before your scheduled appointment time.   If you have a lab appointment with the [place] please come in thru the Main Entrance and check in at the main information desk  You need to re-schedule your appointment should you arrive 10 or more minutes late.  We strive to give you quality time with our providers, and arriving late affects you and other patients whose appointments are after yours.  Also, if you no show three or more times for appointments you may be dismissed from the clinic at the providers discretion.     Again, thank you for choosing [facility].  Our hope is that these requests will decrease the amount of time that you wait before being seen by our physicians.       _____________________________________________________________  Should you have questions after your visit to [facility], please contact our office at [phone] between the hours of 8:00 a.m. and 4:30 p.m.  Voicemails left after 4:00 p.m. will not be returned until the following business day.  For prescription refill requests, have your pharmacy contact our office and allow 72 hours.    Cancer Center Support Programs:   > Cancer Support Group  2nd Tuesday of the month 1pm-2pm, Journey Room

## 2020-05-02 ENCOUNTER — Encounter (HOSPITAL_COMMUNITY): Payer: Self-pay | Admitting: Radiology

## 2020-05-02 NOTE — Progress Notes (Signed)
Juan L. [facility] Male, 59 y.o., [birth year] MRN:  [id number] Phone:  [phone] Juan Wheeler) PCP:  [name], MD Coverage:  [place] With Oncology 05/16/2020 at 2:00 PM  RE: CT Biopsy Received: Today Juan Cleveland, MD  Juan Wheeler   CT core bx  RLL mass enlarging since 2019   [facility]       Previous Messages   ----- Message -----  From: Juan Wheeler D  Sent: 05/01/2020  5:06 PM EDT  To: [facility] Procedure Requests  Subject: CT Biopsy                     Procedure:  CT Biopsy   Reason: Malignant neoplasm of rectum, right lung mass   History: CT, NM PET in computer   Provider: Derek Wheeler   Provider Contact:  [phone]

## 2020-05-02 NOTE — Progress Notes (Signed)
[facility] L. [facility] Male, 59 y.o., November 19, 1960 MRN:  [id number] Phone:  [phone] Jerilynn Mages) PCP:  [name], MD Coverage:  [facility] 05/16/2020 at 2:00 PM  RE: CT Biopsy Received: Jearld Adjutant, MD  Jillyn Hidden Ok to hold for 2 days prior to bx       Previous Messages   ----- Message -----  From: Garth Bigness D  Sent: 05/01/2020  5:10 PM EDT  To: [name], MD, Domenic Polite, MD  Subject: FW: CT Biopsy                   Patient is on Eliquis and will need to hold for 2 days prior to Biopsy. Need to know if it is okay for patient to hold prior to biopsy. Thanks Aniceto Boss  ----- Message -----  From: Garth Bigness D  Sent: 05/01/2020  5:06 PM EDT  To: [facility] Procedure Requests  Subject: CT Biopsy                     Procedure:  CT Biopsy   Reason: Malignant neoplasm of rectum, right lung mass   History: CT, NM PET in computer   Provider: [name]   Provider Contact:  [phone]

## 2020-05-06 ENCOUNTER — Other Ambulatory Visit (HOSPITAL_COMMUNITY)
Admission: RE | Admit: 2020-05-06 | Discharge: 2020-05-06 | Disposition: A | Discharge status: Home / Self Care | Payer: Medicare Other | Source: Ambulatory Visit | Attending: Hematology | Admitting: Hematology

## 2020-05-06 ENCOUNTER — Other Ambulatory Visit: Payer: Self-pay

## 2020-05-06 DIAGNOSIS — Z20822 Contact with and (suspected) exposure to covid-19: Secondary | ICD-10-CM | POA: Diagnosis not present

## 2020-05-06 DIAGNOSIS — Z01812 Encounter for preprocedural laboratory examination: Secondary | ICD-10-CM | POA: Diagnosis not present

## 2020-05-06 LAB — SARS CORONAVIRUS 2 (TAT 6-24 HRS): SARS Coronavirus 2: NEGATIVE

## 2020-05-07 ENCOUNTER — Other Ambulatory Visit: Payer: Self-pay | Admitting: Student

## 2020-05-08 ENCOUNTER — Ambulatory Visit (HOSPITAL_COMMUNITY)
Admission: RE | Admit: 2020-05-08 | Discharge: 2020-05-08 | Disposition: A | Discharge status: Home / Self Care | Payer: Medicare Other | Source: Ambulatory Visit | Attending: Hematology | Admitting: Hematology

## 2020-05-08 ENCOUNTER — Other Ambulatory Visit: Payer: Self-pay

## 2020-05-08 ENCOUNTER — Encounter (HOSPITAL_COMMUNITY): Payer: Self-pay

## 2020-05-08 DIAGNOSIS — Z85048 Personal history of other malignant neoplasm of rectum, rectosigmoid junction, and anus: Secondary | ICD-10-CM | POA: Insufficient documentation

## 2020-05-08 DIAGNOSIS — I2510 Atherosclerotic heart disease of native coronary artery without angina pectoris: Secondary | ICD-10-CM | POA: Insufficient documentation

## 2020-05-08 DIAGNOSIS — I10 Essential (primary) hypertension: Secondary | ICD-10-CM | POA: Diagnosis not present

## 2020-05-08 DIAGNOSIS — J95811 Postprocedural pneumothorax: Secondary | ICD-10-CM | POA: Diagnosis not present

## 2020-05-08 DIAGNOSIS — I739 Peripheral vascular disease, unspecified: Secondary | ICD-10-CM | POA: Insufficient documentation

## 2020-05-08 DIAGNOSIS — Z7901 Long term (current) use of anticoagulants: Secondary | ICD-10-CM | POA: Insufficient documentation

## 2020-05-08 DIAGNOSIS — Z955 Presence of coronary angioplasty implant and graft: Secondary | ICD-10-CM | POA: Diagnosis not present

## 2020-05-08 DIAGNOSIS — R918 Other nonspecific abnormal finding of lung field: Secondary | ICD-10-CM | POA: Diagnosis not present

## 2020-05-08 DIAGNOSIS — Z86718 Personal history of other venous thrombosis and embolism: Secondary | ICD-10-CM | POA: Diagnosis not present

## 2020-05-08 DIAGNOSIS — C20 Malignant neoplasm of rectum: Secondary | ICD-10-CM | POA: Insufficient documentation

## 2020-05-08 DIAGNOSIS — K921 Melena: Secondary | ICD-10-CM | POA: Insufficient documentation

## 2020-05-08 DIAGNOSIS — F17210 Nicotine dependence, cigarettes, uncomplicated: Secondary | ICD-10-CM | POA: Diagnosis not present

## 2020-05-08 DIAGNOSIS — I4891 Unspecified atrial fibrillation: Secondary | ICD-10-CM | POA: Insufficient documentation

## 2020-05-08 DIAGNOSIS — I700 Atherosclerosis of aorta: Secondary | ICD-10-CM | POA: Insufficient documentation

## 2020-05-08 DIAGNOSIS — Z933 Colostomy status: Secondary | ICD-10-CM | POA: Diagnosis not present

## 2020-05-08 DIAGNOSIS — Z79899 Other long term (current) drug therapy: Secondary | ICD-10-CM | POA: Insufficient documentation

## 2020-05-08 DIAGNOSIS — Z7982 Long term (current) use of aspirin: Secondary | ICD-10-CM | POA: Insufficient documentation

## 2020-05-08 DIAGNOSIS — K219 Gastro-esophageal reflux disease without esophagitis: Secondary | ICD-10-CM | POA: Diagnosis not present

## 2020-05-08 DIAGNOSIS — K625 Hemorrhage of anus and rectum: Secondary | ICD-10-CM | POA: Diagnosis not present

## 2020-05-08 DIAGNOSIS — C3431 Malignant neoplasm of lower lobe, right bronchus or lung: Secondary | ICD-10-CM | POA: Diagnosis not present

## 2020-05-08 LAB — CBC
HCT: 49.7 % (ref 39.0–52.0)
Hemoglobin: 16.4 g/dL (ref 13.0–17.0)
MCH: 32.5 pg (ref 26.0–34.0)
MCHC: 33 g/dL (ref 30.0–36.0)
MCV: 98.4 fL (ref 80.0–100.0)
Platelets: 220 10*3/uL (ref 150–400)
RBC: 5.05 MIL/uL (ref 4.22–5.81)
RDW: 14.4 % (ref 11.5–15.5)
WBC: 7.4 10*3/uL (ref 4.0–10.5)
nRBC: 0 % (ref 0.0–0.2)

## 2020-05-08 LAB — PROTIME-INR
INR: 1 (ref 0.8–1.2)
Prothrombin Time: 12.4 seconds (ref 11.4–15.2)

## 2020-05-08 MED ORDER — FENTANYL CITRATE (PF) 100 MCG/2ML IJ SOLN
INTRAMUSCULAR | Status: AC
  Filled 2020-05-08: qty 2

## 2020-05-08 MED ORDER — MIDAZOLAM HCL 2 MG/2ML IJ SOLN
INTRAMUSCULAR | Status: AC
  Filled 2020-05-08: qty 2

## 2020-05-08 MED ORDER — FENTANYL CITRATE (PF) 100 MCG/2ML IJ SOLN
INTRAMUSCULAR | Status: AC | PRN
  Administered 2020-05-08: 50 ug via INTRAVENOUS
  Administered 2020-05-08: 25 ug via INTRAVENOUS

## 2020-05-08 MED ORDER — MIDAZOLAM HCL 2 MG/2ML IJ SOLN
INTRAMUSCULAR | Status: AC | PRN
  Administered 2020-05-08: 1 mg via INTRAVENOUS
  Administered 2020-05-08: 0.5 mg via INTRAVENOUS

## 2020-05-08 MED ORDER — SODIUM CHLORIDE 0.9 % IV SOLN: INTRAVENOUS | Status: DC

## 2020-05-08 NOTE — Procedures (Signed)
Interventional Radiology Procedure Note  Procedure: CT Guided Biopsy of right lower lobe lung mass  Complications: None  Estimated Blood Loss: < 10 mL  Findings: 18 G core biopsy of RLL mass performed under CT guidance.  Two core samples obtained and sent to Pathology.  Venetia Night. Kathlene Cote, M.D Pager:  [phone]

## 2020-05-08 NOTE — Discharge Instructions (Signed)

## 2020-05-08 NOTE — H&P (Signed)
Chief Complaint: Patient was seen in consultation today for right lung mass biopsy at the request of [facility]  Referring Physician(s): [place]  Supervising Physician: Aletta Edouard  Patient Status: [facility] - Out-pt  History of Present Illness: Juan Wheeler is a 59 y.o. male   +smoker Hx rectal cancer-- 2012 Colostomy 2017--Revision 2020 New blood in stool 03/2020-- work up ensued  Follows with Dr Delton Coombes Follow up 05/01/20 CEA 11.4 (03/05/20) -CTAP on 04/03/2020 shows postsurgical/treatment changes in the rectum, perirectal soft tissues with probable left-sided seton stitch suggesting history of perianal fistula with no evidence of abscess. New 14 mm nodular opacity posterior right lung base, incompletely visualized. Changes of avascular necrosis of the left femoral head. -PET scan on 04/29/2020 shows thick-walled cavitary mass in the right lower lobe measuring 4.3 x 3.1 cm.  Hypermetabolic focus in the right hilum likely lymph node.  Hypermetabolic activity in the subcarinal lymph node.  Metabolic activity associated with presacral thickening extending into the left perianal region with a linear surgical seton.  This is consistent with chronic presacral and left gluteal/perianal infection.  Scheduled now for right lower lobe lung mass biopsy LD Eliquis 3 days ago  Past Medical History:  Diagnosis Date  . Chronic lower back pain    a. Followed by pain management at [facility].  . Colon cancer [facility])    rectal cancer  . Coronary artery disease    a. 03/2013: abnl nuc -> LHC s/p DES to LCx, residual moderate disease in LAD (med rx unless refractory angina). b. Not on BB due to bradycardia.  Marland Kitchen DVT (deep venous thrombosis) ([place]) ~ 2013  . Dysrhythmia    AFib  . GERD (gastroesophageal reflux disease)   . H/O necrotising fasciitis   . History of blood transfusion    "once; after throwing up alot of blood" (04/17/2013)  . Hypertension   . LV dysfunction     a. EF 45% in 03/2013.  Marland Kitchen PAD (peripheral artery disease) ([place])    a. Occlusion of the right internal iliac artery, with significant atherosclerosis in the left internal iliac which was not amenable to reconstruction per notes from [facility].  . Pulmonary nodules 09/30/2014  . Rectal cancer ([place])   . Tobacco abuse     Past Surgical History:  Procedure Laterality Date  . ABDOMINAL SURGERY  1990's   'for stomach ulcers" (04/17/2013)  . COLECTOMY  2012   "for rectal cancer" (04/17/2013)  . COLONOSCOPY  2013   Dr. Cheryll Cockayne: colorectal anastomosis with ulcer and inflammation, benign biopsy  . COLONOSCOPY WITH PROPOFOL N/A 09/07/2016   Procedure: COLONOSCOPY WITH PROPOFOL;  Surgeon: Daneil Dolin, MD;  [facility];  Service: Endoscopy;  Laterality: N/A;  10:00 am Colonoscopy via rectum and ostomy  . COLOSTOMY TAKEDOWN  2013  . CORONARY ANGIOPLASTY WITH STENT PLACEMENT  04/17/2013   "?1" (04/17/2013)  . FEMORAL-POPLITEAL BYPASS GRAFT Left 07/02/2015   Procedure: BYPASS GRAFT LEFT COMMON FEMORAL ARTERY TO LEFT ABOVE KNEE POPLITEAL ARTERY - USING LEFT GREATER SAPPHENOUS VEIN;  Surgeon: Elam Dutch, MD;  Location: [place];  Service: Vascular;  Laterality: Left;  . INCISION AND DRAINAGE ABSCESS Left 06/05/2016   Procedure: INCISION AND DRAINAGE ABSCESS;  Surgeon: Aviva Signs, MD;  Location: [facility] ORS;  Service: General;  Laterality: Left;  . INCISION AND DRAINAGE PERIRECTAL ABSCESS Left 06/07/2016   Procedure: IRRIGATION AND DEBRIDEMENT LEFT BUTTOCK ABSCESS;  Surgeon: Greer Pickerel, MD;  Location: [place];  Service: General;  Laterality: Left;  . INGUINAL HERNIA REPAIR Bilateral 1990's  . LAPAROSCOPIC PARTIAL COLECTOMY N/A 06/11/2016   Procedure: LAPAROSCOPIC  OPEN COLOSTOMY;  Surgeon: [name], MD;  Location: [place];  Service: General;  Laterality: N/A;  . LEFT HEART CATHETERIZATION WITH CORONARY ANGIOGRAM N/A 04/17/2013   Procedure: LEFT HEART CATHETERIZATION WITH CORONARY  ANGIOGRAM;  Surgeon: Peter M Martinique, MD;  Location: [facility] CATH LAB;  Service: Cardiovascular;  Laterality: N/A;  . PERCUTANEOUS STENT INTERVENTION  04/17/2013   Procedure: PERCUTANEOUS STENT INTERVENTION;  Surgeon: Peter M Martinique, MD;  Location: [facility] CATH LAB;  Service: Cardiovascular;;  . PERIPHERAL VASCULAR CATHETERIZATION N/A 06/14/2015   Procedure: Abdominal Aortogram;  Surgeon: Elam Dutch, MD;  Location: [place] CV LAB;  Service: Cardiovascular;  Laterality: N/A;  . PERIPHERAL VASCULAR CATHETERIZATION Bilateral 06/14/2015   Procedure: Lower Extremity Angiography;  Surgeon: Elam Dutch, MD;  Location: [place] CV LAB;  Service: Cardiovascular;  Laterality: Bilateral;  . VEIN HARVEST Left 07/02/2015   Procedure: VEIN HARVEST - LEFT GREATER SAPPHENOUS VEIN;  Surgeon: Elam Dutch, MD;  Location: [place];  Service: Vascular;  Laterality: Left;    Allergies: Tramadol and Darvocet [propoxyphene n-acetaminophen]  Medications: Prior to Admission medications   Medication Sig Start Date End Date Taking? Authorizing Provider  amLODipine (NORVASC) 5 MG tablet Take 5 mg by mouth daily. 07/20/16  Yes [provider]  apixaban (ELIQUIS) 5 MG TABS tablet Take 1 tablet (5 mg total) by mouth 2 (two) times daily. 06/16/16  Yes Domenic Polite, MD  aspirin EC 81 MG tablet Take 81 mg by mouth daily.   Yes [provider]  isosorbide mononitrate (IMDUR) 30 MG 24 hr tablet TAKE 1/2 TABLET BY MOUTH ONCE DAILY. Patient taking differently: Take 15 mg by mouth daily.  12/04/15  Yes Herminio Commons, MD  lisinopril (PRINIVIL,ZESTRIL) 40 MG tablet Take 40 mg by mouth daily. 07/20/16  Yes [provider]  omeprazole (PRILOSEC) 40 MG capsule Take 40 mg by mouth daily. 07/20/16  Yes [provider]  traMADol (ULTRAM) 50 MG tablet Take 1 tablet (50 mg total) by mouth every 6 (six) hours as needed. 05/01/20  Yes [name], MD  NITROSTAT 0.4 MG SL tablet PLACE 1  TABLET UNDER THE TONGUE AS NEEDED FOR CHEST PAIN UP TO 3 DOSES Patient taking differently: Place 0.4 mg under the tongue every 5 (five) minutes as needed for chest pain.  10/06/16   Herminio Commons, MD     Family History  Problem Relation Age of Onset  . Cancer Mother   . Hypertension Mother   . Bleeding Disorder Brother     Social History   Socioeconomic History  . Marital status: Married    Spouse name: Not on file  . Number of children: Not on file  . Years of education: Not on file  . Highest education level: Not on file  Occupational History  . Not on file  Tobacco Use  . Smoking status: Light Tobacco Smoker    Packs/day: 0.25    Years: 40.00    Pack years: 10.00    Types: Cigarettes    Start date: 03/14/1974  . Smokeless tobacco: Never Used  . Tobacco comment: 5-6 per day 06/12/15  Substance and Sexual Activity  . Alcohol use: Yes    Alcohol/week: 3.0 standard drinks    Types: 3 Cans of beer per week  . Drug use: Yes    Types: Marijuana    Comment: 2  days ago   . Sexual activity: Yes    Partners: Male    Birth control/protection: None  Other Topics Concern  . Not on file  Social History Narrative  . Not on file   Social Determinants of Health   Financial Resource Strain:   . Difficulty of Paying Living Expenses:   Food Insecurity:   . Worried About Charity fundraiser in the Last Year:   . Arboriculturist in the Last Year:   Transportation Needs:   . Film/video editor (Medical):   Marland Kitchen Lack of Transportation (Non-Medical):   Physical Activity:   . Days of Exercise per Week:   . Minutes of Exercise per Session:   Stress:   . Feeling of Stress :   Social Connections:   . Frequency of Communication with Friends and Family:   . Frequency of Social Gatherings with Friends and Family:   . Attends Religious Services:   . Active Member of Clubs or Organizations:   . Attends Archivist Meetings:   Marland Kitchen Marital Status:     Review of Systems:  A 12 point ROS discussed and pertinent positives are indicated in the HPI above.  All other systems are negative.  Review of Systems  Constitutional: Negative for activity change, fatigue and fever.  Respiratory: Negative for cough and shortness of breath.   Cardiovascular: Negative for chest pain.  Gastrointestinal: Positive for blood in stool.  Musculoskeletal: Positive for back pain.  Psychiatric/Behavioral: Negative for behavioral problems and confusion.    Vital Signs: BP 96/70   Pulse (!) 54   Temp 97.8 F (36.6 C) (Oral)   Resp 16   Ht 5\' 9"  (1.753 m)   Wt 164 lb (74.4 kg)   SpO2 100%   BMI 24.22 kg/m   Physical Exam Vitals reviewed.  HENT:     Mouth/Throat:     Mouth: Mucous membranes are moist.  Cardiovascular:     Rate and Rhythm: Normal rate and regular rhythm.     Heart sounds: Normal heart sounds.  Pulmonary:     Effort: Pulmonary effort is normal.     Breath sounds: Normal breath sounds.  Abdominal:     Palpations: Abdomen is soft.     Tenderness: There is no abdominal tenderness.  Musculoskeletal:        General: Normal range of motion.     Comments: Left hip and back pain  Skin:    General: Skin is warm.  Neurological:     Mental Status: He is alert and oriented to person, place, and time.  Psychiatric:        Behavior: Behavior normal.     Imaging: NM PET Image Initial (PI) Skull Base To Thigh  Result Date: 04/30/2020 CLINICAL DATA:  Subsequent treatment strategy for rectal carcinoma. Prior colostomy and radiation therapy. Elevated CEA. EXAM: NUCLEAR MEDICINE PET SKULL BASE TO THIGH TECHNIQUE: 9.6 mCi F-18 FDG was injected intravenously. Full-ring PET imaging was performed from the skull base to thigh after the radiotracer. CT data was obtained and used for attenuation correction and anatomic localization. Fasting blood glucose: 108 mg/dl COMPARISON:  CT 04/03/2020 FINDINGS: Mediastinal blood pool activity: SUV max 2.3 Liver activity: SUV max 3.2  NECK: No hypermetabolic lymph nodes in the neck. Incidental CT findings: none CHEST: Thick-walled cavitary mass in the RIGHT lower lobe measures 4.3 x 3.1 cm (image 129/3) and has intense metabolic activity SUV max equal 21.4. This hypermetabolic cavitary mass abuts the  pleural surface posteriorly. There is nodular extension inferiorly without significant metabolic activity (image 924/2). Nodule in the RIGHT middle lobe measures 7 mm on image 140). This nodule is flattened and not changed from comparison exam 2019. Centrilobular emphysema the upper lobes. Hypermetabolic focus in the RIGHT hilum likely represents a lymph node with SUV max equal 4.1. Lesion difficult define on noncontrast CT. Hypermetabolic activity associated with subcarinal lymph node measuring 1.1 cm with SUV max equal 3.4. No contralateral hypermetabolic nodes. No supraclavicular hypermetabolic nodes. Incidental CT findings: Coronary artery calcification and aortic atherosclerotic calcification. ABDOMEN/PELVIS: No abnormal metabolic activity liver. Adrenal glands normal. No hypermetabolic abdominopelvic lymph nodes. Intense hypermetabolic activity associated with pre sacral soft tissue thickening. There is a linear tract of hypermetabolic activity along the inner LEFT gluteal region favored chronic infection. Findings consistent with chronic presacral and LEFT gluteal/perianal infection. There is a linear seton stetch through this region. Colostomy in the RIGHT lower quadrant. Incidental CT findings: none SKELETON: No abnormal metabolic activity in the skeleton. Insert patient sclerotic pattern in the LEFT femoral head consistent with avascular necrosis. Incidental CT findings: none IMPRESSION: 1. Thick-walled hypermetabolic mass in the RIGHT lower lobe with differential including bronchogenic carcinoma versus pulmonary infection. Consider assessment and treatment for pulmonary infection follow-up CT scan versus more immediate bronchoscopy for  tissue sampling. 2. Hypermetabolic RIGHT hilar and subcarinal lymph nodes with differential including reactive adenopathy versus metastatic adenopathy. 3. Benign nodule RIGHT middle lobe. 4. Centrilobular emphysema upper lobes. 5. No evidence of colorectal carcinoma recurrence in the abdomen pelvis. 6. Metabolic activity associated presacral thickening extending into the LEFT perianal region with a linear surgical seton. Findings suggest chronic presacral and LEFT gluteal/perianal infection. Electronically Signed   By: Suzy Bouchard M.D.   On: 04/30/2020 11:25    Labs:  CBC: Recent Labs    06/12/19 1036 09/26/19 0858 03/05/20 1308 05/08/20 0855  WBC 7.6 8.3 10.4 7.4  HGB 16.7 17.7* 17.9* 16.4  HCT 49.7 54.1* 53.6* 49.7  PLT 188 233 214 220    COAGS: Recent Labs    05/08/20 0855  INR 1.0    BMP: Recent Labs    06/12/19 1036 09/26/19 0858 03/05/20 1308  NA 137 140 136  K 4.2 4.5 4.3  CL 107 106 103  CO2 21* 24 20*  GLUCOSE 98 114* 204*  BUN 18 12 17   CALCIUM 9.0 9.4 9.4  CREATININE 1.16 1.33* 1.67*  GFRNONAA >60 58* 44*  GFRAA >60 >60 51*    LIVER FUNCTION TESTS: Recent Labs    06/12/19 1036 09/26/19 0858 03/05/20 1308  BILITOT 0.9 1.0 0.7  AST 18 19 23   ALT 15 18 20   ALKPHOS 67 78 70  PROT 7.6 8.3* 7.4  ALBUMIN 3.5 3.9 3.8    TUMOR MARKERS: No results for input(s): AFPTM, CEA, CA199, CHROMGRNA in the last 8760 hours.  Assessment and Plan:  Known rectal cancer 2012 New blood in stool Work up reveals RLL mass; +PET Scheduled now for biopsy of same Risks and benefits of CT guided lung nodule biopsy was discussed with the patient including, but not limited to bleeding, hemoptysis, respiratory failure requiring intubation, infection, pneumothorax requiring chest tube placement, stroke from air embolism or even death.  All of the patient's questions were answered and the patient is agreeable to proceed.  Consent signed and in chart.   Thank you for  this interesting consult.  I greatly enjoyed meeting GERRIT RAFALSKI and look forward to participating in their care.  A copy of this report was sent to the requesting provider on this date.  Electronically Signed: Lavonia Drafts, [name] 05/08/2020, 9:57 AM   I spent a total of  30 Minutes   in face to face in clinical consultation, greater than 50% of which was counseling/coordinating care for right lung  mass biopsy

## 2020-05-09 ENCOUNTER — Other Ambulatory Visit (HOSPITAL_COMMUNITY): Payer: Self-pay

## 2020-05-09 DIAGNOSIS — C3490 Malignant neoplasm of unspecified part of unspecified bronchus or lung: Secondary | ICD-10-CM

## 2020-05-09 LAB — SURGICAL PATHOLOGY

## 2020-05-09 NOTE — Progress Notes (Signed)
Order placed for MRI Brain per Dr. Delton Coombes.

## 2020-05-16 ENCOUNTER — Ambulatory Visit (HOSPITAL_COMMUNITY): Payer: Medicare Other | Admitting: Hematology

## 2020-05-16 DIAGNOSIS — C20 Malignant neoplasm of rectum: Secondary | ICD-10-CM | POA: Diagnosis not present

## 2020-05-16 DIAGNOSIS — S31829A Unspecified open wound of left buttock, initial encounter: Secondary | ICD-10-CM | POA: Diagnosis not present

## 2020-05-16 DIAGNOSIS — Z933 Colostomy status: Secondary | ICD-10-CM | POA: Diagnosis not present

## 2020-05-16 DIAGNOSIS — Z4801 Encounter for change or removal of surgical wound dressing: Secondary | ICD-10-CM | POA: Diagnosis not present

## 2020-05-20 ENCOUNTER — Ambulatory Visit (HOSPITAL_COMMUNITY)
Admission: RE | Admit: 2020-05-20 | Discharge: 2020-05-20 | Disposition: A | Discharge status: Home / Self Care | Payer: Medicare Other | Source: Ambulatory Visit | Attending: Hematology | Admitting: Hematology

## 2020-05-20 ENCOUNTER — Other Ambulatory Visit: Payer: Self-pay

## 2020-05-20 DIAGNOSIS — C3490 Malignant neoplasm of unspecified part of unspecified bronchus or lung: Secondary | ICD-10-CM | POA: Insufficient documentation

## 2020-05-20 DIAGNOSIS — R9082 White matter disease, unspecified: Secondary | ICD-10-CM | POA: Diagnosis not present

## 2020-05-20 DIAGNOSIS — R609 Edema, unspecified: Secondary | ICD-10-CM | POA: Diagnosis not present

## 2020-05-20 DIAGNOSIS — G319 Degenerative disease of nervous system, unspecified: Secondary | ICD-10-CM | POA: Diagnosis not present

## 2020-05-20 MED ORDER — GADOBUTROL 1 MMOL/ML IV SOLN
7.0000 mL | Freq: Once | INTRAVENOUS | Status: AC | PRN
  Administered 2020-05-20: 7 mL via INTRAVENOUS

## 2020-05-22 ENCOUNTER — Encounter (HOSPITAL_COMMUNITY): Payer: Self-pay

## 2020-05-22 ENCOUNTER — Other Ambulatory Visit (HOSPITAL_COMMUNITY): Payer: Self-pay

## 2020-05-22 ENCOUNTER — Other Ambulatory Visit: Payer: Self-pay

## 2020-05-22 ENCOUNTER — Encounter (HOSPITAL_COMMUNITY): Payer: Self-pay | Admitting: Hematology

## 2020-05-22 ENCOUNTER — Inpatient Hospital Stay (HOSPITAL_COMMUNITY): Payer: Medicare Other

## 2020-05-22 ENCOUNTER — Inpatient Hospital Stay (HOSPITAL_BASED_OUTPATIENT_CLINIC_OR_DEPARTMENT_OTHER): Payer: Medicare Other | Admitting: Hematology

## 2020-05-22 VITALS — BP 133/76 | HR 72 | Temp 96.9°F | Resp 17 | Wt 168.3 lb

## 2020-05-22 DIAGNOSIS — N189 Chronic kidney disease, unspecified: Secondary | ICD-10-CM | POA: Diagnosis not present

## 2020-05-22 DIAGNOSIS — F17210 Nicotine dependence, cigarettes, uncomplicated: Secondary | ICD-10-CM | POA: Diagnosis not present

## 2020-05-22 DIAGNOSIS — R200 Anesthesia of skin: Secondary | ICD-10-CM | POA: Diagnosis not present

## 2020-05-22 DIAGNOSIS — C3491 Malignant neoplasm of unspecified part of right bronchus or lung: Secondary | ICD-10-CM

## 2020-05-22 DIAGNOSIS — C20 Malignant neoplasm of rectum: Secondary | ICD-10-CM | POA: Diagnosis not present

## 2020-05-22 DIAGNOSIS — C3490 Malignant neoplasm of unspecified part of unspecified bronchus or lung: Secondary | ICD-10-CM | POA: Diagnosis not present

## 2020-05-22 DIAGNOSIS — M25552 Pain in left hip: Secondary | ICD-10-CM | POA: Diagnosis not present

## 2020-05-22 DIAGNOSIS — M545 Low back pain: Secondary | ICD-10-CM | POA: Diagnosis not present

## 2020-05-22 DIAGNOSIS — R918 Other nonspecific abnormal finding of lung field: Secondary | ICD-10-CM | POA: Diagnosis not present

## 2020-05-22 LAB — COMPREHENSIVE METABOLIC PANEL
ALT: 16 U/L (ref 0–44)
AST: 18 U/L (ref 15–41)
Albumin: 3.7 g/dL (ref 3.5–5.0)
Alkaline Phosphatase: 67 U/L (ref 38–126)
Anion gap: 9 (ref 5–15)
BUN: 9 mg/dL (ref 6–20)
CO2: 25 mmol/L (ref 22–32)
Calcium: 8.9 mg/dL (ref 8.9–10.3)
Chloride: 104 mmol/L (ref 98–111)
Creatinine, Ser: 1.22 mg/dL (ref 0.61–1.24)
GFR calc Af Amer: >60 mL/min (ref >60)
GFR calc non Af Amer: >60 mL/min (ref >60)
Glucose, Bld: 97 mg/dL (ref 70–99)
Potassium: 4.2 mmol/L (ref 3.5–5.1)
Sodium: 138 mmol/L (ref 135–145)
Total Bilirubin: 0.6 mg/dL (ref 0.3–1.2)
Total Protein: 7.8 g/dL (ref 6.5–8.1)

## 2020-05-22 LAB — CBC WITH DIFFERENTIAL/PLATELET
Abs Immature Granulocytes: 0.02 10*3/uL (ref 0.00–0.07)
Basophils Absolute: 0.1 10*3/uL (ref 0.0–0.1)
Basophils Relative: 1 %
Eosinophils Absolute: 0.2 10*3/uL (ref 0.0–0.5)
Eosinophils Relative: 3 %
HCT: 50 % (ref 39.0–52.0)
Hemoglobin: 16.2 g/dL (ref 13.0–17.0)
Immature Granulocytes: 0 %
Lymphocytes Relative: 23 %
Lymphs Abs: 1.8 10*3/uL (ref 0.7–4.0)
MCH: 33.2 pg (ref 26.0–34.0)
MCHC: 32.4 g/dL (ref 30.0–36.0)
MCV: 102.5 fL — ABNORMAL HIGH (ref 80.0–100.0)
Monocytes Absolute: 0.7 10*3/uL (ref 0.1–1.0)
Monocytes Relative: 9 %
Neutro Abs: 5.1 10*3/uL (ref 1.7–7.7)
Neutrophils Relative %: 64 %
Platelets: 208 10*3/uL (ref 150–400)
RBC: 4.88 MIL/uL (ref 4.22–5.81)
RDW: 14.5 % (ref 11.5–15.5)
WBC: 7.9 10*3/uL (ref 4.0–10.5)
nRBC: 0 % (ref 0.0–0.2)

## 2020-05-22 MED ORDER — HYDROCODONE-ACETAMINOPHEN 5-325 MG PO TABS: 1.0000 | ORAL_TABLET | Freq: Two times a day (BID) | ORAL | 0 refills | Status: DC | PRN

## 2020-05-22 NOTE — Progress Notes (Signed)
I met with the patient today during visit with Dr. Delton Coombes. I provided smoking cessation information as well as information on Carboplatin and Taxol at Dr. Tomie China request. I encouraged the patient to call with any questions or concerns.

## 2020-05-22 NOTE — Patient Instructions (Addendum)
Juan Wheeler at [facility] Discharge Instructions  You were seen and examined today by Dr. Delton Coombes.   You recent PET scan and biopsy revealed Lung Cancer. This is not related to your previous rectal cancer.  You will need radiation and chemotherapy. We will refer you to a Radiation Oncologist in [place] as well as a Education officer, environmental here in [place]. The general surgeon will place a Port-A-Cath. A Port-A-Cath is the safest way to administer chemotherapy.   Thank you for choosing [place] at [facility] to provide your oncology and hematology care.  To afford each patient quality time with our provider, please arrive at least 15 minutes before your scheduled appointment time.   If you have a lab appointment with the [place] please come in thru the Main Entrance and check in at the main information desk.  You need to re-schedule your appointment should you arrive 10 or more minutes late.  We strive to give you quality time with our providers, and arriving late affects you and other patients whose appointments are after yours.  Also, if you no show three or more times for appointments you may be dismissed from the clinic at the providers discretion.     Again, thank you for choosing [facility].  Our hope is that these requests will decrease the amount of time that you wait before being seen by our physicians.       _____________________________________________________________  Should you have questions after your visit to [facility], please contact our office at [phone] and follow the prompts.  Our office hours are 8:00 a.m. and 4:30 p.m. Monday - Friday.  Please note that voicemails left after 4:00 p.m. may not be returned until the following business day.  We are closed weekends and major holidays.  You do have access to a nurse 24-7, just call the main number to the clinic [phone] and do not press any  options, hold on the line and a nurse will answer the phone.    For prescription refill requests, have your pharmacy contact our office and allow 72 hours.    Due to Covid, you will need to wear a mask upon entering the hospital. If you do not have a mask, a mask will be given to you at the Main Entrance upon arrival. For doctor visits, patients may have 1 support person age 25 or older with them. For treatment visits, patients can not have anyone with them due to social distancing guidelines and our immunocompromised population.      Steps to Quit Smoking Smoking tobacco is the leading cause of preventable death. It can affect almost every organ in the body. Smoking puts you and people around you at risk for many serious, long-lasting (chronic) diseases. Quitting smoking can be hard, but it is one of the best things that you can do for your health. It is never too late to quit. How do I get ready to quit? When you decide to quit smoking, make a plan to help you succeed. Before you quit:  Pick a date to quit. Set a date within the next 2 weeks to give you time to prepare.  Write down the reasons why you are quitting. Keep this list in places where you will see it often.  Tell your family, friends, and co-workers that you are quitting. Their support is important.  Talk with your doctor about the choices that may help  you quit.  Find out if your health insurance will pay for these treatments.  Know the people, places, things, and activities that make you want to smoke (triggers). Avoid them. What first steps can I take to quit smoking?  Throw away all cigarettes at home, at work, and in your car.  Throw away the things that you use when you smoke, such as ashtrays and lighters.  Clean your car. Make sure to empty the ashtray.  Clean your home, including curtains and carpets. What can I do to help me quit smoking? Talk with your doctor about taking medicines and seeing a counselor at  the same time. You are more likely to succeed when you do both.  If you are pregnant or breastfeeding, talk with your doctor about counseling or other ways to quit smoking. Do not take medicine to help you quit smoking unless your doctor tells you to do so. To quit smoking: Quit right away  Quit smoking totally, instead of slowly cutting back on how much you smoke over a period of time.  Go to counseling. You are more likely to quit if you go to counseling sessions regularly. Take medicine You may take medicines to help you quit. Some medicines need a prescription, and some you can buy over-the-counter. Some medicines may contain a drug called nicotine to replace the nicotine in cigarettes. Medicines may:  Help you to stop having the desire to smoke (cravings).  Help to stop the problems that come when you stop smoking (withdrawal symptoms). Your doctor may ask you to use:  Nicotine patches, gum, or lozenges.  Nicotine inhalers or sprays.  Non-nicotine medicine that is taken by mouth. Find resources Find resources and other ways to help you quit smoking and remain smoke-free after you quit. These resources are most helpful when you use them often. They include:  Online chats with a Social worker.  Phone quitlines.  Printed Furniture conservator/restorer.  Support groups or group counseling.  Text messaging programs.  Mobile phone apps. Use apps on your mobile phone or tablet that can help you stick to your quit plan. There are many free apps for mobile phones and tablets as well as websites. Examples include Quit Guide from the [employer] and smokefree.gov  What things can I do to make it easier to quit?   Talk to your family and friends. Ask them to support and encourage you.  Call a phone quitline (1-800-QUIT-NOW), reach out to support groups, or work with a Social worker.  Ask people who smoke to not smoke around you.  Avoid places that make you want to smoke, such  as: ? Bars. ? Parties. ? Smoke-break areas at work.  Spend time with people who do not smoke.  Lower the stress in your life. Stress can make you want to smoke. Try these things to help your stress: ? Getting regular exercise. ? Doing deep-breathing exercises. ? Doing yoga. ? Meditating. ? Doing a body scan. To do this, close your eyes, focus on one area of your body at a time from head to toe. Notice which parts of your body are tense. Try to relax the muscles in those areas. How will I feel when I quit smoking? Day 1 to 3 weeks Within the first 24 hours, you may start to have some problems that come from quitting tobacco. These problems are very bad 2-3 days after you quit, but they do not often last for more than 2-3 weeks. You may get these symptoms:  Mood swings.  Feeling restless, nervous, angry, or annoyed.  Trouble concentrating.  Dizziness.  Strong desire for high-sugar foods and nicotine.  Weight gain.  Trouble pooping (constipation).  Feeling like you may vomit (nausea).  Coughing or a sore throat.  Changes in how the medicines that you take for other issues work in your body.  Depression.  Trouble sleeping (insomnia). Week 3 and afterward After the first 2-3 weeks of quitting, you may start to notice more positive results, such as:  Better sense of smell and taste.  Less coughing and sore throat.  Slower heart rate.  Lower blood pressure.  Clearer skin.  Better breathing.  Fewer sick days. Quitting smoking can be hard. Do not give up if you fail the first time. Some people need to try a few times before they succeed. Do your best to stick to your quit plan, and talk with your doctor if you have any questions or concerns. Summary  Smoking tobacco is the leading cause of preventable death. Quitting smoking can be hard, but it is one of the best things that you can do for your health.  When you decide to quit smoking, make a plan to help you  succeed.  Quit smoking right away, not slowly over a period of time.  When you start quitting, seek help from your doctor, family, or friends. This information is not intended to replace advice given to you by your health care provider. Make sure you discuss any questions you have with your health care provider. Document Revised: 06/09/2019 Document Reviewed: 12/03/2018 Elsevier Patient Education  [place].

## 2020-05-22 NOTE — Progress Notes (Signed)
START ON PATHWAY REGIMEN - Non-Small Cell Lung     Administer weekly:     Paclitaxel      Carboplatin   **Always confirm dose/schedule in your pharmacy ordering system**  Patient Characteristics: Preoperative or Nonsurgical Candidate (Clinical Staging), Stage III - Nonsurgical Candidate (Nonsquamous and Squamous), PS = 0, 1 Therapeutic Status: Preoperative or Nonsurgical Candidate (Clinical Staging) AJCC T Category: cT2b AJCC N Category: cN2 AJCC M Category: cM0 AJCC 8 Stage Grouping: IIIA ECOG Performance Status: 1 Intent of Therapy: Curative Intent, Discussed with Patient
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
## 2020-05-23 ENCOUNTER — Encounter (HOSPITAL_COMMUNITY): Payer: Self-pay | Admitting: Lab

## 2020-05-23 NOTE — Progress Notes (Unsigned)
Referral sent to [facility].  Records faxed on 8/26

## 2020-05-29 DIAGNOSIS — C20 Malignant neoplasm of rectum: Secondary | ICD-10-CM | POA: Diagnosis not present

## 2020-05-29 DIAGNOSIS — F17210 Nicotine dependence, cigarettes, uncomplicated: Secondary | ICD-10-CM | POA: Diagnosis not present

## 2020-05-29 DIAGNOSIS — I2510 Atherosclerotic heart disease of native coronary artery without angina pectoris: Secondary | ICD-10-CM | POA: Diagnosis not present

## 2020-05-29 DIAGNOSIS — C3491 Malignant neoplasm of unspecified part of right bronchus or lung: Secondary | ICD-10-CM | POA: Diagnosis not present

## 2020-05-29 DIAGNOSIS — Z933 Colostomy status: Secondary | ICD-10-CM | POA: Diagnosis not present

## 2020-05-30 ENCOUNTER — Ambulatory Visit: Payer: Medicare Other | Admitting: General Surgery

## 2020-05-30 ENCOUNTER — Other Ambulatory Visit: Payer: Self-pay

## 2020-05-30 ENCOUNTER — Encounter: Payer: Self-pay | Admitting: General Surgery

## 2020-05-30 ENCOUNTER — Encounter (HOSPITAL_COMMUNITY): Payer: Self-pay | Admitting: Hematology

## 2020-05-30 VITALS — BP 126/74 | HR 85 | Temp 98.3°F | Resp 14 | Ht 69.0 in | Wt 168.0 lb

## 2020-05-30 DIAGNOSIS — C20 Malignant neoplasm of rectum: Secondary | ICD-10-CM | POA: Diagnosis not present

## 2020-05-30 DIAGNOSIS — Z95828 Presence of other vascular implants and grafts: Secondary | ICD-10-CM

## 2020-05-30 DIAGNOSIS — C7800 Secondary malignant neoplasm of unspecified lung: Secondary | ICD-10-CM | POA: Diagnosis not present

## 2020-05-30 HISTORY — DX: Presence of other vascular implants and grafts: Z95.828

## 2020-05-30 NOTE — Progress Notes (Signed)
Juan Wheeler; [id number]; 1961/09/07   HPI Patient is a 59 year old black male who was referred to my care by Drs. [name] and Katragadda for Port-A-Cath insertion.  He has metastatic rectal cancer and needs central venous access for chemotherapy.  He is on Eliquis for history of DVT. Past Medical History:  Diagnosis Date  . Chronic lower back pain    a. Followed by pain management at [facility].  . Colon cancer [facility])    rectal cancer  . Coronary artery disease    a. 03/2013: abnl nuc -> LHC s/p DES to LCx, residual moderate disease in LAD (med rx unless refractory angina). b. Not on BB due to bradycardia.  Marland Kitchen DVT (deep venous thrombosis) ([place]) ~ 2013  . Dysrhythmia    AFib  . GERD (gastroesophageal reflux disease)   . H/O necrotising fasciitis   . History of blood transfusion    "once; after throwing up alot of blood" (04/17/2013)  . Hypertension   . LV dysfunction    a. EF 45% in 03/2013.  Marland Kitchen PAD (peripheral artery disease) ([place])    a. Occlusion of the right internal iliac artery, with significant atherosclerosis in the left internal iliac which was not amenable to reconstruction per notes [facility]  . Pulmonary nodules 09/30/2014  . Rectal cancer ([place])   . Tobacco abuse     Past Surgical History:  Procedure Laterality Date  . ABDOMINAL SURGERY  1990's   'for stomach ulcers" (04/17/2013)  . COLECTOMY  2012   "for rectal cancer" (04/17/2013)  . COLONOSCOPY  2013   Dr. Cheryll Cockayne: colorectal anastomosis with ulcer and inflammation, benign biopsy  . COLONOSCOPY WITH PROPOFOL N/A 09/07/2016   Procedure: COLONOSCOPY WITH PROPOFOL;  Surgeon: Daneil Dolin, MD;  [facility];  Service: Endoscopy;  Laterality: N/A;  10:00 am Colonoscopy via rectum and ostomy  . COLOSTOMY TAKEDOWN  2013  . CORONARY ANGIOPLASTY WITH STENT PLACEMENT  04/17/2013   "?1" (04/17/2013)  . FEMORAL-POPLITEAL BYPASS GRAFT Left 07/02/2015   Procedure: BYPASS GRAFT LEFT COMMON FEMORAL ARTERY TO  LEFT ABOVE KNEE POPLITEAL ARTERY - USING LEFT GREATER SAPPHENOUS VEIN;  Surgeon: Elam Dutch, MD;  Location: [place];  Service: Vascular;  Laterality: Left;  . INCISION AND DRAINAGE ABSCESS Left 06/05/2016   Procedure: INCISION AND DRAINAGE ABSCESS;  Surgeon: Aviva Signs, MD;  Location: [facility];  Service: General;  Laterality: Left;  . INCISION AND DRAINAGE PERIRECTAL ABSCESS Left 06/07/2016   Procedure: IRRIGATION AND DEBRIDEMENT LEFT BUTTOCK ABSCESS;  Surgeon: Greer Pickerel, MD;  Location: [place];  Service: General;  Laterality: Left;  . INGUINAL HERNIA REPAIR Bilateral 1990's  . LAPAROSCOPIC PARTIAL COLECTOMY N/A 06/11/2016   Procedure: LAPAROSCOPIC  OPEN COLOSTOMY;  Surgeon: [name], MD;  Location: [place];  Service: General;  Laterality: N/A;  . LEFT HEART CATHETERIZATION WITH CORONARY ANGIOGRAM N/A 04/17/2013   Procedure: LEFT HEART CATHETERIZATION WITH CORONARY ANGIOGRAM;  Surgeon: Peter M Martinique, MD;  Location: [facility] CATH LAB;  Service: Cardiovascular;  Laterality: N/A;  . PERCUTANEOUS STENT INTERVENTION  04/17/2013   Procedure: PERCUTANEOUS STENT INTERVENTION;  Surgeon: Peter M Martinique, MD;  Location[facility] CATH LAB;  Service: Cardiovascular;;  . PERIPHERAL VASCULAR CATHETERIZATION N/A 06/14/2015   Procedure: Abdominal Aortogram;  Surgeon: Elam Dutch, MD;  Location: [place] CV LAB;  Service: Cardiovascular;  Laterality: N/A;  . PERIPHERAL VASCULAR CATHETERIZATION Bilateral 06/14/2015   Procedure: Lower Extremity Angiography;  Surgeon: Elam Dutch, MD;  Location: [facility]  INVASIVE CV LAB;  Service: Cardiovascular;  Laterality: Bilateral;  . VEIN HARVEST Left 07/02/2015   Procedure: VEIN HARVEST - LEFT GREATER SAPPHENOUS VEIN;  Surgeon: Elam Dutch, MD;  Location: [facility] OR;  Service: Vascular;  Laterality: Left;    Family History  Problem Relation Age of Onset  . Cancer Mother   . Hypertension Mother   . Bleeding Disorder Brother     Current Outpatient Medications on File Prior  to Visit  Medication Sig Dispense Refill  . apixaban (ELIQUIS) 5 MG TABS tablet Take 1 tablet (5 mg total) by mouth 2 (two) times daily.    Marland Kitchen aspirin EC 81 MG tablet Take 81 mg by mouth daily.    . isosorbide mononitrate (IMDUR) 30 MG 24 hr tablet TAKE 1/2 TABLET BY MOUTH ONCE DAILY. (Patient taking differently: Take 15 mg by mouth daily. ) 15 tablet 3  . lisinopril (ZESTRIL) 20 MG tablet Take 20 mg by mouth daily.    Marland Kitchen NITROSTAT 0.4 MG SL tablet PLACE 1 TABLET UNDER THE TONGUE AS NEEDED FOR CHEST PAIN UP TO 3 DOSES 25 tablet 3  . omeprazole (PRILOSEC) 40 MG capsule Take 40 mg by mouth daily.    Marland Kitchen HYDROcodone-acetaminophen (NORCO/VICODIN) 5-325 MG tablet Take 1 tablet by mouth 2 (two) times daily as needed for moderate pain. (Patient not taking: Reported on 05/30/2020) 30 tablet 0  . traMADol (ULTRAM) 50 MG tablet Take 1 tablet (50 mg total) by mouth every 6 (six) hours as needed. (Patient not taking: Reported on 05/30/2020) 30 tablet 0   No current facility-administered medications on file prior to visit.    Allergies  Allergen Reactions  . Tramadol     Felt sluggish and ineffective   . Darvocet [Propoxyphene N-Acetaminophen] Palpitations    Social History   Substance and Sexual Activity  Alcohol Use Yes  . Alcohol/week: 3.0 standard drinks  . Types: 3 Cans of beer per week    Social History   Tobacco Use  Smoking Status Light Tobacco Smoker  . Packs/day: 0.25  . Years: 40.00  . Pack years: 10.00  . Types: Cigarettes  . Start date: 03/14/1974  Smokeless Tobacco Never Used  Tobacco Comment   5-6 per day 06/12/15    Review of Systems  Constitutional: Negative.   HENT: Negative.   Eyes: Negative.   Respiratory: Positive for cough.   Cardiovascular: Negative.   Gastrointestinal: Positive for heartburn.  Musculoskeletal: Positive for back pain and joint pain.  Skin: Negative.   Neurological: Positive for sensory change.  Endo/Heme/Allergies: Bruises/bleeds easily.   Psychiatric/Behavioral: Negative.     Objective   Vitals:   05/30/20 0901  BP: 126/74  Pulse: 85  Resp: 14  Temp: 98.3 F (36.8 C)  SpO2: 95%    Physical Exam Vitals reviewed.  Constitutional:      Appearance: Normal appearance. He is normal weight. He is not ill-appearing.  HENT:     Head: Normocephalic and atraumatic.  Cardiovascular:     Rate and Rhythm: Normal rate and regular rhythm.     Heart sounds: Normal heart sounds. No murmur heard.  No friction rub. No gallop.   Pulmonary:     Effort: Pulmonary effort is normal. No respiratory distress.     Breath sounds: Normal breath sounds. No stridor. No wheezing, rhonchi or rales.  Skin:    General: Skin is warm and dry.  Neurological:     Mental Status: He is alert and oriented to person, place, and  time.   Oncology notes reviewed  Assessment  Metastatic rectal cancer, need for central venous access Plan   Patient is scheduled for Port-A-Cath insertion on 06/05/2020.  The risks and benefits of the procedure including bleeding, infection, and pneumothorax were fully explained to the patient, who gave informed consent.  Patient is to stop his Eliquis 2 days before the procedure.

## 2020-05-30 NOTE — H&P (Signed)
Juan Wheeler; [id number]; [birth year]   HPI Patient is a 59 year old black male who was referred to my care by Drs. [name] and Katragadda for Port-A-Cath insertion.  He has metastatic rectal cancer and needs central venous access for chemotherapy.  He is on Eliquis for history of DVT. Past Medical History:  Diagnosis Date  . Chronic lower back pain    a. Followed by [facility] management at [facility].  . Colon cancer [facility])    rectal cancer  . Coronary artery disease    a. 03/2013: abnl nuc -> LHC s/p DES to LCx, residual moderate disease in LAD (med rx unless refractory angina). b. Not on BB due to bradycardia.  Marland Kitchen DVT (deep venous thrombosis) ([place]) ~ 2013  . Dysrhythmia    AFib  . GERD (gastroesophageal reflux disease)   . H/O necrotising fasciitis   . History of blood transfusion    "once; after throwing up alot of blood" (04/17/2013)  . Hypertension   . LV dysfunction    a. EF 45% in 03/2013.  Marland Kitchen PAD (peripheral artery disease) ([place])    a. Occlusion of the right internal iliac artery, with significant atherosclerosis in the left internal iliac which was not amenable to reconstruction per notes from [facility].  . Pulmonary nodules 09/30/2014  . Rectal cancer ([place])   . Tobacco abuse     Past Surgical History:  Procedure Laterality Date  . ABDOMINAL SURGERY  1990's   'for stomach ulcers" (04/17/2013)  . COLECTOMY  2012   "for rectal cancer" (04/17/2013)  . COLONOSCOPY  2013   Dr. Cheryll Cockayne: colorectal anastomosis with ulcer and inflammation, benign biopsy  . COLONOSCOPY WITH PROPOFOL N/A 09/07/2016   Procedure: COLONOSCOPY WITH PROPOFOL;  Surgeon: Daneil Dolin, MD;  [facility];  Service: Endoscopy;  Laterality: N/A;  10:00 am Colonoscopy via rectum and ostomy  . COLOSTOMY TAKEDOWN  2013  . CORONARY ANGIOPLASTY WITH STENT PLACEMENT  04/17/2013   "?1" (04/17/2013)  . FEMORAL-POPLITEAL BYPASS GRAFT Left 07/02/2015   Procedure: BYPASS GRAFT LEFT COMMON FEMORAL ARTERY TO  LEFT ABOVE KNEE POPLITEAL ARTERY - USING LEFT GREATER SAPPHENOUS VEIN;  Surgeon: Elam Dutch, MD;  Location: [place];  Service: Vascular;  Laterality: Left;  . INCISION AND DRAINAGE ABSCESS Left 06/05/2016   Procedure: INCISION AND DRAINAGE ABSCESS;  Surgeon: Aviva Signs, MD;  Location: [facility];  Service: General;  Laterality: Left;  . INCISION AND DRAINAGE PERIRECTAL ABSCESS Left 06/07/2016   Procedure: IRRIGATION AND DEBRIDEMENT LEFT BUTTOCK ABSCESS;  Surgeon: Greer Pickerel, MD;  Location: [place];  Service: General;  Laterality: Left;  . INGUINAL HERNIA REPAIR Bilateral 1990's  . LAPAROSCOPIC PARTIAL COLECTOMY N/A 06/11/2016   Procedure: LAPAROSCOPIC  OPEN COLOSTOMY;  Surgeon: [name], MD;  Location: [place];  Service: General;  Laterality: N/A;  . LEFT HEART CATHETERIZATION WITH CORONARY ANGIOGRAM N/A 04/17/2013   Procedure: LEFT HEART CATHETERIZATION WITH CORONARY ANGIOGRAM;  Surgeon: Peter M Martinique, MD;  Location: [facility] CATH LAB;  Service: Cardiovascular;  Laterality: N/A;  . PERCUTANEOUS STENT INTERVENTION  04/17/2013   Procedure: PERCUTANEOUS STENT INTERVENTION;  Surgeon: Peter M Martinique, MD;  Location: [facility] CATH LAB;  Service: Cardiovascular;;  . PERIPHERAL VASCULAR CATHETERIZATION N/A 06/14/2015   Procedure: Abdominal Aortogram;  Surgeon: Elam Dutch, MD;  Location: [place] CV LAB;  Service: Cardiovascular;  Laterality: N/A;  . PERIPHERAL VASCULAR CATHETERIZATION Bilateral 06/14/2015   Procedure: Lower Extremity Angiography;  Surgeon: Elam Dutch, MD;  Location: [facility]  INVASIVE CV LAB;  Service: Cardiovascular;  Laterality: Bilateral;  . VEIN HARVEST Left 07/02/2015   Procedure: VEIN HARVEST - LEFT GREATER SAPPHENOUS VEIN;  Surgeon: Elam Dutch, MD;  Location: [facility] OR;  Service: Vascular;  Laterality: Left;    Family History  Problem Relation Age of Onset  . Cancer Mother   . Hypertension Mother   . Bleeding Disorder Brother     Current Outpatient Medications on File Prior  to Visit  Medication Sig Dispense Refill  . apixaban (ELIQUIS) 5 MG TABS tablet Take 1 tablet (5 mg total) by mouth 2 (two) times daily.    Marland Kitchen aspirin EC 81 MG tablet Take 81 mg by mouth daily.    . isosorbide mononitrate (IMDUR) 30 MG 24 hr tablet TAKE 1/2 TABLET BY MOUTH ONCE DAILY. (Patient taking differently: Take 15 mg by mouth daily. ) 15 tablet 3  . lisinopril (ZESTRIL) 20 MG tablet Take 20 mg by mouth daily.    Marland Kitchen NITROSTAT 0.4 MG SL tablet PLACE 1 TABLET UNDER THE TONGUE AS NEEDED FOR CHEST PAIN UP TO 3 DOSES 25 tablet 3  . omeprazole (PRILOSEC) 40 MG capsule Take 40 mg by mouth daily.    Marland Kitchen HYDROcodone-acetaminophen (NORCO/VICODIN) 5-325 MG tablet Take 1 tablet by mouth 2 (two) times daily as needed for moderate pain. (Patient not taking: Reported on 05/30/2020) 30 tablet 0  . traMADol (ULTRAM) 50 MG tablet Take 1 tablet (50 mg total) by mouth every 6 (six) hours as needed. (Patient not taking: Reported on 05/30/2020) 30 tablet 0   No current facility-administered medications on file prior to visit.    Allergies  Allergen Reactions  . Tramadol     Felt sluggish and ineffective   . Darvocet [Propoxyphene N-Acetaminophen] Palpitations    Social History   Substance and Sexual Activity  Alcohol Use Yes  . Alcohol/week: 3.0 standard drinks  . Types: 3 Cans of beer per week    Social History   Tobacco Use  Smoking Status Light Tobacco Smoker  . Packs/day: 0.25  . Years: 40.00  . Pack years: 10.00  . Types: Cigarettes  . Start date: 03/14/1974  Smokeless Tobacco Never Used  Tobacco Comment   5-6 per day 06/12/15    Review of Systems  Constitutional: Negative.   HENT: Negative.   Eyes: Negative.   Respiratory: Positive for cough.   Cardiovascular: Negative.   Gastrointestinal: Positive for heartburn.  Musculoskeletal: Positive for back pain and joint pain.  Skin: Negative.   Neurological: Positive for sensory change.  Endo/Heme/Allergies: Bruises/bleeds easily.   Psychiatric/Behavioral: Negative.     Objective   Vitals:   05/30/20 0901  BP: 126/74  Pulse: 85  Resp: 14  Temp: 98.3 F (36.8 C)  SpO2: 95%    Physical Exam Vitals reviewed.  Constitutional:      Appearance: Normal appearance. He is normal weight. He is not ill-appearing.  HENT:     Head: Normocephalic and atraumatic.  Cardiovascular:     Rate and Rhythm: Normal rate and regular rhythm.     Heart sounds: Normal heart sounds. No murmur heard.  No friction rub. No gallop.   Pulmonary:     Effort: Pulmonary effort is normal. No respiratory distress.     Breath sounds: Normal breath sounds. No stridor. No wheezing, rhonchi or rales.  Skin:    General: Skin is warm and dry.  Neurological:     Mental Status: He is alert and oriented to person, place, and  time.   Oncology notes reviewed  Assessment  Metastatic rectal cancer, need for central venous access Plan   Patient is scheduled for Port-A-Cath insertion on 06/05/2020.  The risks and benefits of the procedure including bleeding, infection, and pneumothorax were fully explained to the patient, who gave informed consent.  Patient is to stop his Eliquis 2 days before the procedure.

## 2020-05-30 NOTE — Patient Instructions (Signed)
Stop Eliquis on Monday, 9/6     Implanted [facility] Guide An implanted port is a device that is placed under the skin. It is usually placed in the chest. The device can be used to give IV medicine, to take blood, or for dialysis. You may have an implanted port if:  You need IV medicine that would be irritating to the small veins in your hands or arms.  You need IV medicines, such as antibiotics, for a long period of time.  You need IV nutrition for a long period of time.  You need dialysis. Having a port means that your health care provider will not need to use the veins in your arms for these procedures. You may have fewer limitations when using a port than you would if you used other types of long-term IVs, and you will likely be able to return to normal activities after your incision heals. An implanted port has two main parts:  Reservoir. The reservoir is the part where a needle is inserted to give medicines or draw blood. The reservoir is round. After it is placed, it appears as a small, raised area under your skin.  Catheter. The catheter is a thin, flexible tube that connects the reservoir to a vein. Medicine that is inserted into the reservoir goes into the catheter and then into the vein. How is my port accessed? To access your port:  A numbing cream may be placed on the skin over the port site.  Your health care provider will put on a mask and sterile gloves.  The skin over your port will be cleaned carefully with a germ-killing soap and allowed to dry.  Your health care provider will gently pinch the port and insert a needle into it.  Your health care provider will check for a blood return to make sure the port is in the vein and is not clogged.  If your port needs to remain accessed to get medicine continuously (constant infusion), your health care provider will place a clear bandage (dressing) over the needle site. The dressing and needle will need to be changed every  week, or as told by your health care provider. What is flushing? Flushing helps keep the port from getting clogged. Follow instructions from your health care provider about how and when to flush the port. Ports are usually flushed with saline solution or a medicine called heparin. The need for flushing will depend on how the port is used:  If the port is only used from time to time to give medicines or draw blood, the port may need to be flushed: ? Before and after medicines have been given. ? Before and after blood has been drawn. ? As part of routine maintenance. Flushing may be recommended every 4-6 weeks.  If a constant infusion is running, the port may not need to be flushed.  Throw away any syringes in a disposal container that is meant for sharp items (sharps container). You can buy a sharps container from a pharmacy, or you can make one by using an empty hard plastic bottle with a cover. How long will my port stay implanted? The port can stay in for as long as your health care provider thinks it is needed. When it is time for the port to come out, a surgery will be done to remove it. The surgery will be similar to the procedure that was done to put the port in. Follow these instructions at home:   Flush  your port as told by your health care provider.  If you need an infusion over several days, follow instructions from your health care provider about how to take care of your port site. Make sure you: ? Wash your hands with soap and water before you change your dressing. If soap and water are not available, use alcohol-based hand sanitizer. ? Change your dressing as told by your health care provider. ? Place any used dressings or infusion bags into a plastic bag. Throw that bag in the trash. ? Keep the dressing that covers the needle clean and dry. Do not get it wet. ? Do not use scissors or sharp objects near the tube. ? Keep the tube clamped, unless it is being used.  Check your  port site every day for signs of infection. Check for: ? Redness, swelling, or pain. ? Fluid or blood. ? Pus or a bad smell.  Protect the skin around the port site. ? Avoid wearing bra straps that rub or irritate the site. ? Protect the skin around your port from seat belts. Place a soft pad over your chest if needed.  Bathe or shower as told by your health care provider. The site may get wet as long as you are not actively receiving an infusion.  Return to your normal activities as told by your health care provider. Ask your health care provider what activities are safe for you.  Carry a medical alert card or wear a medical alert bracelet at all times. This will let health care providers know that you have an implanted port in case of an emergency. Get help right away if:  You have redness, swelling, or pain at the port site.  You have fluid or blood coming from your port site.  You have pus or a bad smell coming from the port site.  You have a fever. Summary  Implanted ports are usually placed in the chest for long-term IV access.  Follow instructions from your health care provider about flushing the port and changing bandages (dressings).  Take care of the area around your port by avoiding clothing that puts pressure on the area, and by watching for signs of infection.  Protect the skin around your port from seat belts. Place a soft pad over your chest if needed.  Get help right away if you have a fever or you have redness, swelling, pain, drainage, or a bad smell at the port site. This information is not intended to replace advice given to you by your health care provider. Make sure you discuss any questions you have with your health care provider. Document Revised: 01/06/2019 Document Reviewed: 10/17/2016 Elsevier Patient Education  2020 [employer].

## 2020-05-31 ENCOUNTER — Encounter (HOSPITAL_COMMUNITY): Payer: Self-pay

## 2020-05-31 ENCOUNTER — Other Ambulatory Visit: Payer: Self-pay

## 2020-05-31 ENCOUNTER — Encounter (HOSPITAL_COMMUNITY)
Admission: RE | Admit: 2020-05-31 | Discharge: 2020-05-31 | Disposition: A | Discharge status: Home / Self Care | Payer: Medicare Other | Source: Ambulatory Visit | Attending: General Surgery | Admitting: General Surgery

## 2020-06-04 ENCOUNTER — Other Ambulatory Visit (HOSPITAL_COMMUNITY)
Admission: RE | Admit: 2020-06-04 | Discharge: 2020-06-04 | Disposition: A | Discharge status: Home / Self Care | Payer: Medicare Other | Source: Ambulatory Visit | Attending: General Surgery | Admitting: General Surgery

## 2020-06-04 ENCOUNTER — Other Ambulatory Visit: Payer: Self-pay

## 2020-06-04 DIAGNOSIS — Z01812 Encounter for preprocedural laboratory examination: Secondary | ICD-10-CM | POA: Insufficient documentation

## 2020-06-04 DIAGNOSIS — Z20822 Contact with and (suspected) exposure to covid-19: Secondary | ICD-10-CM | POA: Diagnosis not present

## 2020-06-04 LAB — SARS CORONAVIRUS 2 (TAT 6-24 HRS): SARS Coronavirus 2: NEGATIVE

## 2020-06-05 ENCOUNTER — Ambulatory Visit (HOSPITAL_COMMUNITY): Payer: Medicare Other | Admitting: Hematology

## 2020-06-05 ENCOUNTER — Encounter (HOSPITAL_COMMUNITY)
Admission: RE | Disposition: A | Discharge status: Home / Self Care | Payer: Self-pay | Source: Home / Self Care | Attending: General Surgery

## 2020-06-05 ENCOUNTER — Other Ambulatory Visit (HOSPITAL_COMMUNITY): Payer: Medicare Other

## 2020-06-05 ENCOUNTER — Ambulatory Visit (HOSPITAL_COMMUNITY)
Admission: RE | Admit: 2020-06-05 | Discharge: 2020-06-05 | Disposition: A | Discharge status: Home / Self Care | Payer: Medicare Other | Attending: General Surgery | Admitting: General Surgery

## 2020-06-05 ENCOUNTER — Ambulatory Visit (HOSPITAL_COMMUNITY): Payer: Medicare Other

## 2020-06-05 ENCOUNTER — Encounter (HOSPITAL_COMMUNITY): Payer: Self-pay | Admitting: General Surgery

## 2020-06-05 ENCOUNTER — Ambulatory Visit (HOSPITAL_COMMUNITY): Payer: Medicare Other | Admitting: Anesthesiology

## 2020-06-05 DIAGNOSIS — Z86718 Personal history of other venous thrombosis and embolism: Secondary | ICD-10-CM | POA: Insufficient documentation

## 2020-06-05 DIAGNOSIS — C799 Secondary malignant neoplasm of unspecified site: Secondary | ICD-10-CM | POA: Diagnosis not present

## 2020-06-05 DIAGNOSIS — I10 Essential (primary) hypertension: Secondary | ICD-10-CM | POA: Insufficient documentation

## 2020-06-05 DIAGNOSIS — F17210 Nicotine dependence, cigarettes, uncomplicated: Secondary | ICD-10-CM | POA: Diagnosis not present

## 2020-06-05 DIAGNOSIS — C7800 Secondary malignant neoplasm of unspecified lung: Secondary | ICD-10-CM

## 2020-06-05 DIAGNOSIS — K219 Gastro-esophageal reflux disease without esophagitis: Secondary | ICD-10-CM | POA: Insufficient documentation

## 2020-06-05 DIAGNOSIS — I4891 Unspecified atrial fibrillation: Secondary | ICD-10-CM | POA: Diagnosis not present

## 2020-06-05 DIAGNOSIS — Z955 Presence of coronary angioplasty implant and graft: Secondary | ICD-10-CM | POA: Diagnosis not present

## 2020-06-05 DIAGNOSIS — Z7901 Long term (current) use of anticoagulants: Secondary | ICD-10-CM | POA: Diagnosis not present

## 2020-06-05 DIAGNOSIS — Z885 Allergy status to narcotic agent status: Secondary | ICD-10-CM | POA: Insufficient documentation

## 2020-06-05 DIAGNOSIS — C20 Malignant neoplasm of rectum: Secondary | ICD-10-CM | POA: Insufficient documentation

## 2020-06-05 DIAGNOSIS — I2510 Atherosclerotic heart disease of native coronary artery without angina pectoris: Secondary | ICD-10-CM | POA: Insufficient documentation

## 2020-06-05 DIAGNOSIS — I739 Peripheral vascular disease, unspecified: Secondary | ICD-10-CM | POA: Insufficient documentation

## 2020-06-05 DIAGNOSIS — Z79899 Other long term (current) drug therapy: Secondary | ICD-10-CM | POA: Insufficient documentation

## 2020-06-05 DIAGNOSIS — R918 Other nonspecific abnormal finding of lung field: Secondary | ICD-10-CM | POA: Diagnosis not present

## 2020-06-05 DIAGNOSIS — Z7982 Long term (current) use of aspirin: Secondary | ICD-10-CM | POA: Insufficient documentation

## 2020-06-05 DIAGNOSIS — I872 Venous insufficiency (chronic) (peripheral): Secondary | ICD-10-CM | POA: Diagnosis not present

## 2020-06-05 DIAGNOSIS — Z95828 Presence of other vascular implants and grafts: Secondary | ICD-10-CM

## 2020-06-05 HISTORY — PX: PORTACATH PLACEMENT: SHX2246

## 2020-06-05 SURGERY — INSERTION, TUNNELED CENTRAL VENOUS DEVICE, WITH PORT
Anesthesia: General | Site: Chest | Laterality: Left

## 2020-06-05 MED ORDER — PROPOFOL 10 MG/ML IV BOLUS
INTRAVENOUS | Status: DC | PRN
  Administered 2020-06-05: 20 mg via INTRAVENOUS

## 2020-06-05 MED ORDER — HEPARIN SOD (PORK) LOCK FLUSH 100 UNIT/ML IV SOLN
INTRAVENOUS | Status: AC
  Filled 2020-06-05: qty 5

## 2020-06-05 MED ORDER — CHLORHEXIDINE GLUCONATE CLOTH 2 % EX PADS: 6.0000 | MEDICATED_PAD | Freq: Once | CUTANEOUS | Status: DC

## 2020-06-05 MED ORDER — LIDOCAINE HCL (PF) 1 % IJ SOLN
INTRAMUSCULAR | Status: AC
  Filled 2020-06-05: qty 30

## 2020-06-05 MED ORDER — SODIUM CHLORIDE (PF) 0.9 % IJ SOLN
INTRAMUSCULAR | Status: DC | PRN
  Administered 2020-06-05: 500 mL

## 2020-06-05 MED ORDER — ONDANSETRON HCL 4 MG/2ML IJ SOLN
INTRAMUSCULAR | Status: DC | PRN
  Administered 2020-06-05: 4 mg via INTRAVENOUS

## 2020-06-05 MED ORDER — CEFAZOLIN SODIUM-DEXTROSE 2-4 GM/100ML-% IV SOLN
2.0000 g | INTRAVENOUS | Status: AC
  Administered 2020-06-05: 2 g via INTRAVENOUS

## 2020-06-05 MED ORDER — PROPOFOL 500 MG/50ML IV EMUL
INTRAVENOUS | Status: DC | PRN
  Administered 2020-06-05: 75 ug/kg/min via INTRAVENOUS

## 2020-06-05 MED ORDER — LACTATED RINGERS IV SOLN
INTRAVENOUS | Status: DC
  Administered 2020-06-05: 1000 mL via INTRAVENOUS

## 2020-06-05 MED ORDER — HYDROCODONE-ACETAMINOPHEN 5-325 MG PO TABS
1.0000 | ORAL_TABLET | ORAL | 0 refills | Status: DC | PRN
Start: 2020-06-05 — End: 2020-06-13

## 2020-06-05 MED ORDER — CHLORHEXIDINE GLUCONATE 0.12 % MT SOLN
15.0000 mL | Freq: Once | OROMUCOSAL | Status: AC
  Administered 2020-06-05: 15 mL via OROMUCOSAL

## 2020-06-05 MED ORDER — KETOROLAC TROMETHAMINE 30 MG/ML IJ SOLN
30.0000 mg | Freq: Once | INTRAMUSCULAR | Status: AC
  Administered 2020-06-05: 30 mg via INTRAVENOUS
  Filled 2020-06-05: qty 1

## 2020-06-05 MED ORDER — CEFAZOLIN SODIUM-DEXTROSE 2-4 GM/100ML-% IV SOLN
INTRAVENOUS | Status: AC
  Filled 2020-06-05: qty 100

## 2020-06-05 MED ORDER — MIDAZOLAM HCL 5 MG/5ML IJ SOLN
INTRAMUSCULAR | Status: DC | PRN
  Administered 2020-06-05: 1 mg via INTRAVENOUS

## 2020-06-05 MED ORDER — LIDOCAINE HCL (PF) 1 % IJ SOLN
INTRAMUSCULAR | Status: DC | PRN
  Administered 2020-06-05: 8 mL

## 2020-06-05 MED ORDER — MIDAZOLAM HCL 2 MG/2ML IJ SOLN
INTRAMUSCULAR | Status: AC
  Filled 2020-06-05: qty 2

## 2020-06-05 MED ORDER — HEPARIN SOD (PORK) LOCK FLUSH 100 UNIT/ML IV SOLN
INTRAVENOUS | Status: DC | PRN
  Administered 2020-06-05: 500 [IU] via INTRAVENOUS

## 2020-06-05 MED ORDER — ORAL CARE MOUTH RINSE: 15.0000 mL | Freq: Once | OROMUCOSAL | Status: AC

## 2020-06-05 MED ORDER — FENTANYL CITRATE (PF) 100 MCG/2ML IJ SOLN: 25.0000 ug | INTRAMUSCULAR | Status: DC | PRN

## 2020-06-05 SURGICAL SUPPLY — 31 items
ADH SKN CLS APL DERMABOND .7 (GAUZE/BANDAGES/DRESSINGS) ×1
APL PRP STRL LF ISPRP CHG 10.5 (MISCELLANEOUS) ×1
APPLICATOR CHLORAPREP 10.5 ORG (MISCELLANEOUS) ×2 IMPLANT
BAG DECANTER FOR FLEXI CONT (MISCELLANEOUS) ×2 IMPLANT
CLOTH BEACON ORANGE TIMEOUT ST (SAFETY) ×2 IMPLANT
COVER LIGHT HANDLE STERIS (MISCELLANEOUS) ×4 IMPLANT
COVER WAND RF STERILE (DRAPES) ×2 IMPLANT
DECANTER SPIKE VIAL GLASS SM (MISCELLANEOUS) ×2 IMPLANT
DERMABOND ADVANCED (GAUZE/BANDAGES/DRESSINGS) ×1
DERMABOND ADVANCED .7 DNX12 (GAUZE/BANDAGES/DRESSINGS) ×1 IMPLANT
DRAPE C-ARM FOLDED MOBILE STRL (DRAPES) ×2 IMPLANT
ELECT REM PT RETURN 9FT ADLT (ELECTROSURGICAL) ×2
ELECTRODE REM PT RTRN 9FT ADLT (ELECTROSURGICAL) ×1 IMPLANT
GLOVE BIO SURGEON STRL SZ 6.5 (GLOVE) ×2 IMPLANT
GLOVE BIOGEL PI IND STRL 7.0 (GLOVE) ×2 IMPLANT
GLOVE BIOGEL PI INDICATOR 7.0 (GLOVE) ×2
GLOVE SURG SS PI 7.5 STRL IVOR (GLOVE) ×2 IMPLANT
GOWN STRL REUS W/TWL LRG LVL3 (GOWN DISPOSABLE) ×4 IMPLANT
IV NS 500ML (IV SOLUTION) ×2
IV NS 500ML BAXH (IV SOLUTION) ×1 IMPLANT
KIT PORT POWER 8FR ISP MRI (Port) ×2 IMPLANT
KIT TURNOVER KIT A (KITS) ×2 IMPLANT
NEEDLE HYPO 25X1 1.5 SAFETY (NEEDLE) ×2 IMPLANT
PACK MINOR (CUSTOM PROCEDURE TRAY) ×2 IMPLANT
PAD ARMBOARD 7.5X6 YLW CONV (MISCELLANEOUS) ×2 IMPLANT
SET BASIN LINEN APH (SET/KITS/TRAYS/PACK) ×2 IMPLANT
SUT MNCRL AB 4-0 PS2 18 (SUTURE) ×2 IMPLANT
SUT VIC AB 3-0 SH 27 (SUTURE) ×2
SUT VIC AB 3-0 SH 27X BRD (SUTURE) ×1 IMPLANT
SYR 5ML LL (SYRINGE) ×2 IMPLANT
SYR CONTROL 10ML LL (SYRINGE) ×2 IMPLANT

## 2020-06-05 NOTE — Anesthesia Postprocedure Evaluation (Signed)
Anesthesia Post Note  Patient: Juan Wheeler  Procedure(s) Performed: INSERTION PORT-A-CATH (Left Chest)  Patient location during evaluation: PACU Anesthesia Type: General Level of consciousness: awake and alert and oriented Pain management: pain level controlled Vital Signs Assessment: post-procedure vital signs reviewed and stable Respiratory status: spontaneous breathing Cardiovascular status: blood pressure returned to baseline and stable Postop Assessment: no apparent nausea or vomiting Anesthetic complications: no   No complications documented.   Last Vitals:  Vitals:   06/05/20 0935 06/05/20 0945  BP: 93/67 94/64  Pulse:  83  Resp: 18 (!) 23  Temp: (!) 36.3 C   SpO2: 93% 92%    Last Pain:  Vitals:   06/05/20 0935  TempSrc:   PainSc: Asleep                 Candise Crabtree

## 2020-06-05 NOTE — Transfer of Care (Signed)
Immediate Anesthesia Transfer of Care Note  Patient: Juan Wheeler  Procedure(s) Performed: INSERTION PORT-A-CATH (Left Chest)  Patient Location: PACU  Anesthesia Type:General  Level of Consciousness: awake  Airway & Oxygen Therapy: Patient Spontanous Breathing  Post-op Assessment: Report given to RN  Post vital signs: Reviewed and stable  Last Vitals:  Vitals Value Taken Time  BP    Temp    Pulse    Resp    SpO2      Last Pain:  Vitals:   06/05/20 0831  TempSrc: Oral  PainSc: 0-No pain      Patients Stated Pain Goal: 8 (59/92/34 1443)  Complications: No complications documented.

## 2020-06-05 NOTE — Anesthesia Preprocedure Evaluation (Signed)
Anesthesia Evaluation  Patient identified by MRN, date of birth, ID band Patient awake    Reviewed: Allergy & Precautions, H&P , NPO status , Patient's Chart, lab work & pertinent test results, reviewed documented beta blocker date and time   Airway Mallampati: II  TM Distance: >3 FB Neck ROM: full    Dental no notable dental hx. (+) Edentulous Upper, Edentulous Lower   Pulmonary neg pulmonary ROS, Current Smoker,    Pulmonary exam normal breath sounds clear to auscultation       Cardiovascular Exercise Tolerance: Good hypertension, + angina + CAD and + Peripheral Vascular Disease  negative cardio ROS  + dysrhythmias Atrial Fibrillation  Rhythm:regular Rate:Normal     Neuro/Psych PSYCHIATRIC DISORDERS Depression negative neurological ROS  negative psych ROS   GI/Hepatic negative GI ROS, Neg liver ROS, GERD  Medicated,  Endo/Other  negative endocrine ROS  Renal/GU negative Renal ROS  negative genitourinary   Musculoskeletal   Abdominal   Peds  Hematology negative hematology ROS (+)   Anesthesia Other Findings   Reproductive/Obstetrics negative OB ROS                             Anesthesia Physical Anesthesia Plan  ASA: III  Anesthesia Plan: General   Post-op Pain Management:    Induction:   PONV Risk Score and Plan: Propofol infusion  Airway Management Planned:   Additional Equipment:   Intra-op Plan:   Post-operative Plan:   Informed Consent: I have reviewed the patients History and Physical, chart, labs and discussed the procedure including the risks, benefits and alternatives for the proposed anesthesia with the patient or authorized representative who has indicated his/her understanding and acceptance.     Dental Advisory Given  Plan Discussed with: CRNA  Anesthesia Plan Comments:         Anesthesia Quick Evaluation

## 2020-06-05 NOTE — Op Note (Signed)
Patient:  Juan Wheeler  DOB:  [birth year]  MRN:  [id number]   Preop Diagnosis: Metastatic rectal cancer  Postop Diagnosis: Same  Procedure: Port-A-Cath insertion  Surgeon: Aviva Signs, MD  Anes: MAC  Indications: Patient is a 59 year old white male who presents for Port-A-Cath.  He is undergoing chemotherapy for rectal cancer.  The risks and benefits of the procedure including bleeding, infection, and pneumothorax were fully explained to the patient, who gave informed consent.  Procedure note: The patient was placed in the supine Trendelenburg position.  After anesthesia was given, the left upper chest was prepped and draped using the usual sterile technique with ChloraPrep.  Surgical site confirmation was performed.  1% Xylocaine was used for local anesthesia.  An incision was made below the left clavicle.  A subcutaneous pocket was formed.  The needle was advanced into the left subclavian vein using the Seldinger technique without difficulty.  A guidewire was then advanced into the right atrium under fluoroscopic guidance.  An introducer and peel-away sheath were placed over the guidewire.  The catheter was then inserted into the peel-away sheath and the peel-away sheath was removed.  The catheter was then attached to the port and the port placed in subcutaneous pocket.  Adequate positioning was confirmed by fluoroscopy.  Good backflow of venous blood was noted on aspiration of the port.  The port was flushed with heparin flush.  The subcutaneous layer was reapproximated using a 3-0 Vicryl interrupted suture.  The skin was closed using a 4-0 Monocryl subcuticular suture.  Dermabond was applied.  All tape and needle counts were correct at the end of the procedure.  The patient was awakened and transferred to PACU in stable condition.  A chest x-ray will be performed at that time.  Complications: None  EBL: Minimal  Specimen: None

## 2020-06-05 NOTE — Discharge Instructions (Signed)
Implanted Port Home Guide An implanted port is a device that is placed under the skin. It is usually placed in the chest. The device can be used to give IV medicine, to take blood, or for dialysis. You may have an implanted port if:  You need IV medicine that would be irritating to the small veins in your hands or arms.  You need IV medicines, such as antibiotics, for a long period of time.  You need IV nutrition for a long period of time.  You need dialysis. Having a port means that your health care provider will not need to use the veins in your arms for these procedures. You may have fewer limitations when using a port than you would if you used other types of long-term IVs, and you will likely be able to return to normal activities after your incision heals. An implanted port has two main parts:  Reservoir. The reservoir is the part where a needle is inserted to give medicines or draw blood. The reservoir is round. After it is placed, it appears as a small, raised area under your skin.  Catheter. The catheter is a thin, flexible tube that connects the reservoir to a vein. Medicine that is inserted into the reservoir goes into the catheter and then into the vein. How is my port accessed? To access your port:  A numbing cream may be placed on the skin over the port site.  Your health care provider will put on a mask and sterile gloves.  The skin over your port will be cleaned carefully with a germ-killing soap and allowed to dry.  Your health care provider will gently pinch the port and insert a needle into it.  Your health care provider will check for a blood return to make sure the port is in the vein and is not clogged.  If your port needs to remain accessed to get medicine continuously (constant infusion), your health care provider will place a clear bandage (dressing) over the needle site. The dressing and needle will need to be changed every week, or as told by your health care  provider. What is flushing? Flushing helps keep the port from getting clogged. Follow instructions from your health care provider about how and when to flush the port. Ports are usually flushed with saline solution or a medicine called heparin. The need for flushing will depend on how the port is used:  If the port is only used from time to time to give medicines or draw blood, the port may need to be flushed: ? Before and after medicines have been given. ? Before and after blood has been drawn. ? As part of routine maintenance. Flushing may be recommended every 4-6 weeks.  If a constant infusion is running, the port may not need to be flushed.  Throw away any syringes in a disposal container that is meant for sharp items (sharps container). You can buy a sharps container from a pharmacy, or you can make one by using an empty hard plastic bottle with a cover. How long will my port stay implanted? The port can stay in for as long as your health care provider thinks it is needed. When it is time for the port to come out, a surgery will be done to remove it. The surgery will be similar to the procedure that was done to put the port in. Follow these instructions at home:   Flush your port as told by your health care provider.    If you need an infusion over several days, follow instructions from your health care provider about how to take care of your port site. Make sure you: ? Wash your hands with soap and water before you change your dressing. If soap and water are not available, use alcohol-based hand sanitizer. ? Change your dressing as told by your health care provider. ? Place any used dressings or infusion bags into a plastic bag. Throw that bag in the trash. ? Keep the dressing that covers the needle clean and dry. Do not get it wet. ? Do not use scissors or sharp objects near the tube. ? Keep the tube clamped, unless it is being used.  Check your port site every day for signs of  infection. Check for: ? Redness, swelling, or pain. ? Fluid or blood. ? Pus or a bad smell.  Protect the skin around the port site. ? Avoid wearing bra straps that rub or irritate the site. ? Protect the skin around your port from seat belts. Place a soft pad over your chest if needed.  Bathe or shower as told by your health care provider. The site may get wet as long as you are not actively receiving an infusion.  Return to your normal activities as told by your health care provider. Ask your health care provider what activities are safe for you.  Carry a medical alert card or wear a medical alert bracelet at all times. This will let health care providers know that you have an implanted port in case of an emergency. Get help right away if:  You have redness, swelling, or pain at the port site.  You have fluid or blood coming from your port site.  You have pus or a bad smell coming from the port site.  You have a fever. Summary  Implanted ports are usually placed in the chest for long-term IV access.  Follow instructions from your health care provider about flushing the port and changing bandages (dressings).  Take care of the area around your port by avoiding clothing that puts pressure on the area, and by watching for signs of infection.  Protect the skin around your port from seat belts. Place a soft pad over your chest if needed.  Get help right away if you have a fever or you have redness, swelling, pain, drainage, or a bad smell at the port site. This information is not intended to replace advice given to you by your health care provider. Make sure you discuss any questions you have with your health care provider. Document Revised: 01/06/2019 Document Reviewed: 10/17/2016 Elsevier Patient Education  2020 [employer]     General Anesthesia, Adult, Care After This sheet gives you information about how to care for yourself after your procedure. Your health care  provider may also give you more specific instructions. If you have problems or questions, contact your health care provider. What can I expect after the procedure? After the procedure, the following side effects are common:  Pain or discomfort at the IV site.  Nausea.  Vomiting.  Sore throat.  Trouble concentrating.  Feeling cold or chills.  Weak or tired.  Sleepiness and fatigue.  Soreness and body aches. These side effects can affect parts of the body that were not involved in surgery. Follow these instructions at home:  For at least 24 hours after the procedure:  Have a responsible adult stay with you. It is important to have someone help care for you until you   are awake and alert.  Rest as needed.  Do not: ? Participate in activities in which you could fall or become injured. ? Drive. ? Use heavy machinery. ? Drink alcohol. ? Take sleeping pills or medicines that cause drowsiness. ? Make important decisions or sign legal documents. ? Take care of children on your own. Eating and drinking  Follow any instructions from your health care provider about eating or drinking restrictions.  When you feel hungry, start by eating small amounts of foods that are soft and easy to digest (bland), such as toast. Gradually return to your regular diet.  Drink enough fluid to keep your urine pale yellow.  If you vomit, rehydrate by drinking water, juice, or clear broth. General instructions  If you have sleep apnea, surgery and certain medicines can increase your risk for breathing problems. Follow instructions from your health care provider about wearing your sleep device: ? Anytime you are sleeping, including during daytime naps. ? While taking prescription pain medicines, sleeping medicines, or medicines that make you drowsy.  Return to your normal activities as told by your health care provider. Ask your health care provider what activities are safe for you.  Take  over-the-counter and prescription medicines only as told by your health care provider.  If you smoke, do not smoke without supervision.  Keep all follow-up visits as told by your health care provider. This is important. Contact a health care provider if:  You have nausea or vomiting that does not get better with medicine.  You cannot eat or drink without vomiting.  You have pain that does not get better with medicine.  You are unable to pass urine.  You develop a skin rash.  You have a fever.  You have redness around your IV site that gets worse. Get help right away if:  You have difficulty breathing.  You have chest pain.  You have blood in your urine or stool, or you vomit blood. Summary  After the procedure, it is common to have a sore throat or nausea. It is also common to feel tired.  Have a responsible adult stay with you for the first 24 hours after general anesthesia. It is important to have someone help care for you until you are awake and alert.  When you feel hungry, start by eating small amounts of foods that are soft and easy to digest (bland), such as toast. Gradually return to your regular diet.  Drink enough fluid to keep your urine pale yellow.  Return to your normal activities as told by your health care provider. Ask your health care provider what activities are safe for you. This information is not intended to replace advice given to you by your health care provider. Make sure you discuss any questions you have with your health care provider. Document Revised: 09/17/2017 Document Reviewed: 04/30/2017 Elsevier Patient Education  2020 [employer]   

## 2020-06-05 NOTE — Interval H&P Note (Signed)
History and Physical Interval Note:  06/05/2020 9:01 AM  Juan Wheeler  has presented today for surgery, with the diagnosis of Rectal Cancer.  The various methods of treatment have been discussed with the patient and family. After consideration of risks, benefits and other options for treatment, the patient has consented to  Procedure(s): INSERTION PORT-A-CATH (Left) as a surgical intervention.  The patient's history has been reviewed, patient examined, no change in status, stable for surgery.  I have reviewed the patient's chart and labs.  Questions were answered to the patient's satisfaction.     Aviva Signs

## 2020-06-06 ENCOUNTER — Encounter (HOSPITAL_COMMUNITY): Payer: Self-pay | Admitting: General Surgery

## 2020-06-10 ENCOUNTER — Other Ambulatory Visit (HOSPITAL_COMMUNITY): Payer: Medicare Other

## 2020-06-10 ENCOUNTER — Ambulatory Visit (HOSPITAL_COMMUNITY): Payer: Medicare Other | Admitting: Hematology

## 2020-06-10 DIAGNOSIS — C7801 Secondary malignant neoplasm of right lung: Secondary | ICD-10-CM | POA: Diagnosis not present

## 2020-06-10 DIAGNOSIS — C801 Malignant (primary) neoplasm, unspecified: Secondary | ICD-10-CM | POA: Diagnosis not present

## 2020-06-11 DIAGNOSIS — C3431 Malignant neoplasm of lower lobe, right bronchus or lung: Secondary | ICD-10-CM | POA: Diagnosis not present

## 2020-06-11 DIAGNOSIS — C801 Malignant (primary) neoplasm, unspecified: Secondary | ICD-10-CM | POA: Diagnosis not present

## 2020-06-11 DIAGNOSIS — C7801 Secondary malignant neoplasm of right lung: Secondary | ICD-10-CM | POA: Diagnosis not present

## 2020-06-13 ENCOUNTER — Ambulatory Visit (HOSPITAL_COMMUNITY): Payer: Medicare Other

## 2020-06-13 ENCOUNTER — Other Ambulatory Visit (HOSPITAL_COMMUNITY): Payer: Medicare Other

## 2020-06-13 ENCOUNTER — Inpatient Hospital Stay (HOSPITAL_COMMUNITY): Payer: Medicare Other | Attending: Hematology | Admitting: Hematology

## 2020-06-13 ENCOUNTER — Other Ambulatory Visit (HOSPITAL_COMMUNITY): Payer: Self-pay | Admitting: *Deleted

## 2020-06-13 ENCOUNTER — Other Ambulatory Visit: Payer: Self-pay

## 2020-06-13 ENCOUNTER — Inpatient Hospital Stay (HOSPITAL_COMMUNITY): Payer: Medicare Other

## 2020-06-13 ENCOUNTER — Telehealth (HOSPITAL_COMMUNITY): Payer: Self-pay | Admitting: Hematology

## 2020-06-13 DIAGNOSIS — M25552 Pain in left hip: Secondary | ICD-10-CM | POA: Diagnosis not present

## 2020-06-13 DIAGNOSIS — F17210 Nicotine dependence, cigarettes, uncomplicated: Secondary | ICD-10-CM | POA: Diagnosis not present

## 2020-06-13 DIAGNOSIS — C20 Malignant neoplasm of rectum: Secondary | ICD-10-CM

## 2020-06-13 DIAGNOSIS — C3431 Malignant neoplasm of lower lobe, right bronchus or lung: Secondary | ICD-10-CM | POA: Insufficient documentation

## 2020-06-13 DIAGNOSIS — Z5111 Encounter for antineoplastic chemotherapy: Secondary | ICD-10-CM | POA: Diagnosis not present

## 2020-06-13 DIAGNOSIS — C3491 Malignant neoplasm of unspecified part of right bronchus or lung: Secondary | ICD-10-CM

## 2020-06-13 DIAGNOSIS — C3490 Malignant neoplasm of unspecified part of unspecified bronchus or lung: Secondary | ICD-10-CM

## 2020-06-13 DIAGNOSIS — N189 Chronic kidney disease, unspecified: Secondary | ICD-10-CM | POA: Insufficient documentation

## 2020-06-13 LAB — CBC WITH DIFFERENTIAL/PLATELET
Abs Immature Granulocytes: 0.02 10*3/uL (ref 0.00–0.07)
Basophils Absolute: 0 10*3/uL (ref 0.0–0.1)
Basophils Relative: 0 %
Eosinophils Absolute: 0.2 10*3/uL (ref 0.0–0.5)
Eosinophils Relative: 3 %
HCT: 51 % (ref 39.0–52.0)
Hemoglobin: 16.5 g/dL (ref 13.0–17.0)
Immature Granulocytes: 0 %
Lymphocytes Relative: 20 %
Lymphs Abs: 1.5 10*3/uL (ref 0.7–4.0)
MCH: 32.7 pg (ref 26.0–34.0)
MCHC: 32.4 g/dL (ref 30.0–36.0)
MCV: 101.2 fL — ABNORMAL HIGH (ref 80.0–100.0)
Monocytes Absolute: 0.7 10*3/uL (ref 0.1–1.0)
Monocytes Relative: 9 %
Neutro Abs: 5.2 10*3/uL (ref 1.7–7.7)
Neutrophils Relative %: 68 %
Platelets: 194 10*3/uL (ref 150–400)
RBC: 5.04 MIL/uL (ref 4.22–5.81)
RDW: 14.6 % (ref 11.5–15.5)
WBC: 7.7 10*3/uL (ref 4.0–10.5)
nRBC: 0 % (ref 0.0–0.2)

## 2020-06-13 LAB — COMPREHENSIVE METABOLIC PANEL
ALT: 14 U/L (ref 0–44)
AST: 16 U/L (ref 15–41)
Albumin: 3.4 g/dL — ABNORMAL LOW (ref 3.5–5.0)
Alkaline Phosphatase: 64 U/L (ref 38–126)
Anion gap: 8 (ref 5–15)
BUN: 11 mg/dL (ref 6–20)
CO2: 25 mmol/L (ref 22–32)
Calcium: 9.1 mg/dL (ref 8.9–10.3)
Chloride: 104 mmol/L (ref 98–111)
Creatinine, Ser: 1.17 mg/dL (ref 0.61–1.24)
GFR calc Af Amer: >60 mL/min (ref >60)
GFR calc non Af Amer: >60 mL/min (ref >60)
Glucose, Bld: 102 mg/dL — ABNORMAL HIGH (ref 70–99)
Potassium: 3.9 mmol/L (ref 3.5–5.1)
Sodium: 137 mmol/L (ref 135–145)
Total Bilirubin: 1.1 mg/dL (ref 0.3–1.2)
Total Protein: 7.5 g/dL (ref 6.5–8.1)

## 2020-06-13 MED ORDER — HEPARIN SOD (PORK) LOCK FLUSH 100 UNIT/ML IV SOLN
500.0000 [IU] | Freq: Once | INTRAVENOUS | Status: AC
  Administered 2020-06-13: 500 [IU] via INTRAVENOUS

## 2020-06-13 MED ORDER — SODIUM CHLORIDE 0.9% FLUSH
10.0000 mL | Freq: Once | INTRAVENOUS | Status: AC
  Administered 2020-06-13: 10 mL via INTRAVENOUS

## 2020-06-13 MED ORDER — HYDROCODONE-ACETAMINOPHEN 5-325 MG PO TABS
1.0000 | ORAL_TABLET | Freq: Three times a day (TID) | ORAL | 0 refills | Status: DC | PRN
Start: 2020-06-13 — End: 2020-07-08
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
## 2020-06-13 NOTE — Progress Notes (Signed)
Patients port flushed without difficulty.  Good blood return noted with no bruising or swelling noted at site.  Band aid applied.  VSS with discharge and left in satisfactory condition with no s/s of distress noted.   

## 2020-06-13 NOTE — Patient Instructions (Signed)
[place] at [facility] Discharge Instructions  You were seen today by Dr. Delton Coombes. He went over your recent results. You will be prescribed Norco 1 tablet every 8 hours for your back and hip pain. Dr. Delton Coombes will see you back on 9/30 for labs and follow up.   Thank you for choosing [place] at [facility] to provide your oncology and hematology care.  To afford each patient quality time with our provider, please arrive at least 15 minutes before your scheduled appointment time.   If you have a lab appointment with the [facility] please come in thru the Main Entrance and check in at the main information desk  You need to re-schedule your appointment should you arrive 10 or more minutes late.  We strive to give you quality time with our providers, and arriving late affects you and other patients whose appointments are after yours.  Also, if you no show three or more times for appointments you may be dismissed from the clinic at the providers discretion.     Again, thank you for choosing [facility].  Our hope is that these requests will decrease the amount of time that you wait before being seen by our physicians.       _____________________________________________________________  Should you have questions after your visit to [facility], please contact our office at [phone] between the hours of 8:00 a.m. and 4:30 p.m.  Voicemails left after 4:00 p.m. will not be returned until the following business day.  For prescription refill requests, have your pharmacy contact our office and allow 72 hours.    Cancer Center Support Programs:   > Cancer Support Group  2nd Tuesday of the month 1pm-2pm, Journey Room

## 2020-06-13 NOTE — Telephone Encounter (Signed)
Contacted [place] Transportation service spoke to [place]. Enrolled pt into the program and gave them pts next 2 appt dates of 9/30 and 10/7. Was advised that they will reach out to the pt closer to the [facility].

## 2020-06-18 ENCOUNTER — Inpatient Hospital Stay (HOSPITAL_COMMUNITY): Payer: Medicare Other | Admitting: General Practice

## 2020-06-18 ENCOUNTER — Encounter (HOSPITAL_COMMUNITY): Payer: Self-pay | Admitting: General Practice

## 2020-06-18 DIAGNOSIS — C20 Malignant neoplasm of rectum: Secondary | ICD-10-CM
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
## 2020-06-18 NOTE — Progress Notes (Signed)
[place] CSW Progress Notes  Call to [place], [employer]. They have rides scheduled for patient from home to [facility] and return on days he has infusion.  Per patient, he is also in daily radiation at [facility] and patient believes that facility has also coordinated rides for him to/from radiation.  Advised the [employer] Transportation needs to call patient and clarify pick up location for infusions, they will call patient.  Called DSS, determined that per his Medicaid worker Bevelyn Ngo [phone]), he is not eligible for Medicaid transportation.  Per patient, he is familiar w [facility] but cannot afford the $6/ride charge.  [facility] team advised.   Edwyna Shell, LCSW Clinical Social Worker Phone:  [phone]

## 2020-06-19 DIAGNOSIS — C801 Malignant (primary) neoplasm, unspecified: Secondary | ICD-10-CM | POA: Diagnosis not present

## 2020-06-19 DIAGNOSIS — C3431 Malignant neoplasm of lower lobe, right bronchus or lung: Secondary | ICD-10-CM | POA: Diagnosis not present

## 2020-06-19 DIAGNOSIS — C7801 Secondary malignant neoplasm of right lung: Secondary | ICD-10-CM | POA: Diagnosis not present

## 2020-06-20 ENCOUNTER — Other Ambulatory Visit (HOSPITAL_COMMUNITY): Payer: Medicare Other

## 2020-06-20 ENCOUNTER — Ambulatory Visit (HOSPITAL_COMMUNITY): Payer: Medicare Other | Admitting: Hematology

## 2020-06-20 ENCOUNTER — Ambulatory Visit (HOSPITAL_COMMUNITY): Payer: Medicare Other

## 2020-06-24 DIAGNOSIS — C7801 Secondary malignant neoplasm of right lung: Secondary | ICD-10-CM | POA: Diagnosis not present

## 2020-06-24 DIAGNOSIS — C3431 Malignant neoplasm of lower lobe, right bronchus or lung: Secondary | ICD-10-CM | POA: Diagnosis not present

## 2020-06-24 DIAGNOSIS — C801 Malignant (primary) neoplasm, unspecified: Secondary | ICD-10-CM | POA: Diagnosis not present

## 2020-06-24 NOTE — Progress Notes (Signed)
.   Pharmacist Chemotherapy Monitoring - Initial Assessment    Anticipated start date: 06/27/20  Regimen:  . Are orders appropriate based on the patient's diagnosis, regimen, and cycle? Yes . Does the plan date match the patient's scheduled date? Yes . Is the sequencing of drugs appropriate? Yes . Are the premedications appropriate for the patient's regimen? Yes . Prior Authorization for treatment is: Approved o If applicable, is the correct biosimilar selected based on the patient's insurance? NA  Organ Function and Labs: Marland Kitchen Are dose adjustments needed based on the patient's renal function, hepatic function, or hematologic function? No . Are appropriate labs ordered prior to the start of patient's treatment? Yes . Other organ system assessment, if indicated: N/A . The following baseline labs, if indicated, have been ordered: N/A  Dose Assessment: . Are the drug doses appropriate? Yes . Are the following correct: o Drug concentrations Yes o IV fluid compatible with drug Yes o Administration routes Yes o Timing of therapy Yes . If applicable, does the patient have documented access for treatment and/or plans for port-a-cath placement? yes . If applicable, have lifetime cumulative doses been properly documented and assessed? yes Lifetime Dose Tracking  No doses have been documented on this patient for the following tracked chemicals: Doxorubicin, Epirubicin, Idarubicin, Daunorubicin, Mitoxantrone, Bleomycin, Oxaliplatin, Carboplatin, Liposomal Doxorubicin  o   Toxicity Monitoring/Prevention: . The patient has the following take home antiemetics prescribed: Needs ordering . The patient has the following take home medications prescribed: N/A . Medication allergies and previous infusion related reactions, if applicable, have been reviewed and addressed. Yes . The patient's current medication list has been assessed for drug-drug interactions with their chemotherapy regimen. no significant  drug-drug interactions were identified on review.  Order Review: . Are the treatment plan orders signed? No . Is the patient scheduled to see a provider prior to their treatment? Yes  I verify that I have reviewed each item in the above checklist and answered each question accordingly.  Juan Wheeler 06/24/2020 8:47 AM

## 2020-06-26 MED ORDER — PROCHLORPERAZINE MALEATE 10 MG PO TABS: 10.0000 mg | ORAL_TABLET | Freq: Four times a day (QID) | ORAL | 1 refills | Status: DC | PRN

## 2020-06-26 MED ORDER — LIDOCAINE-PRILOCAINE 2.5-2.5 % EX CREA: TOPICAL_CREAM | CUTANEOUS | 3 refills | Status: DC

## 2020-06-27 ENCOUNTER — Inpatient Hospital Stay (HOSPITAL_COMMUNITY): Payer: Medicare Other

## 2020-06-27 ENCOUNTER — Other Ambulatory Visit: Payer: Self-pay

## 2020-06-27 ENCOUNTER — Inpatient Hospital Stay (HOSPITAL_BASED_OUTPATIENT_CLINIC_OR_DEPARTMENT_OTHER): Payer: Medicare Other | Admitting: Hematology

## 2020-06-27 VITALS — BP 124/73 | HR 65 | Temp 96.8°F | Resp 17 | Wt 165.5 lb

## 2020-06-27 VITALS — BP 129/81 | HR 70 | Temp 96.9°F | Resp 18

## 2020-06-27 DIAGNOSIS — C20 Malignant neoplasm of rectum: Secondary | ICD-10-CM

## 2020-06-27 DIAGNOSIS — M25552 Pain in left hip: Secondary | ICD-10-CM | POA: Diagnosis not present

## 2020-06-27 DIAGNOSIS — C3431 Malignant neoplasm of lower lobe, right bronchus or lung: Secondary | ICD-10-CM | POA: Diagnosis not present

## 2020-06-27 DIAGNOSIS — C7801 Secondary malignant neoplasm of right lung: Secondary | ICD-10-CM | POA: Diagnosis not present

## 2020-06-27 DIAGNOSIS — C3491 Malignant neoplasm of unspecified part of right bronchus or lung: Secondary | ICD-10-CM | POA: Diagnosis not present

## 2020-06-27 DIAGNOSIS — F17210 Nicotine dependence, cigarettes, uncomplicated: Secondary | ICD-10-CM | POA: Diagnosis not present

## 2020-06-27 DIAGNOSIS — Z5111 Encounter for antineoplastic chemotherapy: Secondary | ICD-10-CM | POA: Diagnosis not present

## 2020-06-27 DIAGNOSIS — Z95828 Presence of other vascular implants and grafts: Secondary | ICD-10-CM

## 2020-06-27 DIAGNOSIS — C801 Malignant (primary) neoplasm, unspecified: Secondary | ICD-10-CM | POA: Diagnosis not present

## 2020-06-27 DIAGNOSIS — N189 Chronic kidney disease, unspecified: Secondary | ICD-10-CM | POA: Diagnosis not present

## 2020-06-27 LAB — COMPREHENSIVE METABOLIC PANEL
ALT: 15 U/L (ref 0–44)
AST: 17 U/L (ref 15–41)
Albumin: 3.6 g/dL (ref 3.5–5.0)
Alkaline Phosphatase: 67 U/L (ref 38–126)
Anion gap: 7 (ref 5–15)
BUN: 12 mg/dL (ref 6–20)
CO2: 23 mmol/L (ref 22–32)
Calcium: 9.2 mg/dL (ref 8.9–10.3)
Chloride: 107 mmol/L (ref 98–111)
Creatinine, Ser: 1.27 mg/dL — ABNORMAL HIGH (ref 0.61–1.24)
GFR calc Af Amer: >60 mL/min (ref >60)
GFR calc non Af Amer: >60 mL/min (ref >60)
Glucose, Bld: 98 mg/dL (ref 70–99)
Potassium: 4.1 mmol/L (ref 3.5–5.1)
Sodium: 137 mmol/L (ref 135–145)
Total Bilirubin: 0.7 mg/dL (ref 0.3–1.2)
Total Protein: 7.8 g/dL (ref 6.5–8.1)

## 2020-06-27 LAB — CBC WITH DIFFERENTIAL/PLATELET
Abs Immature Granulocytes: 0.01 10*3/uL (ref 0.00–0.07)
Basophils Absolute: 0.1 10*3/uL (ref 0.0–0.1)
Basophils Relative: 1 %
Eosinophils Absolute: 0.3 10*3/uL (ref 0.0–0.5)
Eosinophils Relative: 4 %
HCT: 52.3 % — ABNORMAL HIGH (ref 39.0–52.0)
Hemoglobin: 17.1 g/dL — ABNORMAL HIGH (ref 13.0–17.0)
Immature Granulocytes: 0 %
Lymphocytes Relative: 22 %
Lymphs Abs: 1.6 10*3/uL (ref 0.7–4.0)
MCH: 33.4 pg (ref 26.0–34.0)
MCHC: 32.7 g/dL (ref 30.0–36.0)
MCV: 102.1 fL — ABNORMAL HIGH (ref 80.0–100.0)
Monocytes Absolute: 0.7 10*3/uL (ref 0.1–1.0)
Monocytes Relative: 9 %
Neutro Abs: 4.8 10*3/uL (ref 1.7–7.7)
Neutrophils Relative %: 64 %
Platelets: 201 10*3/uL (ref 150–400)
RBC: 5.12 MIL/uL (ref 4.22–5.81)
RDW: 14.7 % (ref 11.5–15.5)
WBC: 7.4 10*3/uL (ref 4.0–10.5)
nRBC: 0 % (ref 0.0–0.2)

## 2020-06-27 MED ORDER — SODIUM CHLORIDE 0.9 % IV SOLN
20.0000 mg | Freq: Once | INTRAVENOUS | Status: AC
  Administered 2020-06-27: 20 mg via INTRAVENOUS
  Filled 2020-06-27: qty 20

## 2020-06-27 MED ORDER — SODIUM CHLORIDE 0.9 % IV SOLN
183.6000 mg | Freq: Once | INTRAVENOUS | Status: AC
  Administered 2020-06-27: 180 mg via INTRAVENOUS
  Filled 2020-06-27: qty 18

## 2020-06-27 MED ORDER — DIPHENHYDRAMINE HCL 50 MG/ML IJ SOLN
INTRAMUSCULAR | Status: AC
  Filled 2020-06-27: qty 1

## 2020-06-27 MED ORDER — SODIUM CHLORIDE 0.9 % IV SOLN: Freq: Once | INTRAVENOUS | Status: AC

## 2020-06-27 MED ORDER — DIPHENHYDRAMINE HCL 50 MG/ML IJ SOLN
50.0000 mg | Freq: Once | INTRAMUSCULAR | Status: AC
  Administered 2020-06-27: 50 mg via INTRAVENOUS

## 2020-06-27 MED ORDER — SODIUM CHLORIDE 0.9 % IV SOLN
45.0000 mg/m2 | Freq: Once | INTRAVENOUS | Status: AC
  Administered 2020-06-27: 84 mg via INTRAVENOUS
  Filled 2020-06-27: qty 14

## 2020-06-27 MED ORDER — SODIUM CHLORIDE 0.9% FLUSH
10.0000 mL | INTRAVENOUS | Status: DC | PRN
  Administered 2020-06-27 (×2): 10 mL

## 2020-06-27 MED ORDER — HEPARIN SOD (PORK) LOCK FLUSH 100 UNIT/ML IV SOLN
500.0000 [IU] | Freq: Once | INTRAVENOUS | Status: AC | PRN
  Administered 2020-06-27: 500 [IU]

## 2020-06-27 MED ORDER — PALONOSETRON HCL INJECTION 0.25 MG/5ML
INTRAVENOUS | Status: AC
  Filled 2020-06-27: qty 5

## 2020-06-27 MED ORDER — FAMOTIDINE IN NACL 20-0.9 MG/50ML-% IV SOLN
INTRAVENOUS | Status: AC
  Filled 2020-06-27: qty 50

## 2020-06-27 MED ORDER — FAMOTIDINE IN NACL 20-0.9 MG/50ML-% IV SOLN
20.0000 mg | Freq: Once | INTRAVENOUS | Status: AC
  Administered 2020-06-27: 20 mg via INTRAVENOUS

## 2020-06-27 MED ORDER — PALONOSETRON HCL INJECTION 0.25 MG/5ML
0.2500 mg | Freq: Once | INTRAVENOUS | Status: AC
  Administered 2020-06-27: 0.25 mg via INTRAVENOUS

## 2020-06-27 NOTE — Progress Notes (Signed)
D1C1 treatment today. Consent obtained.  Teaching packet for chemotherapy given and explained to the patient.  All questions answered. Labs reviewed with dr Raliegh Ip, okay to proceed with treatment.   Tolerated first treatment well today without incidence.  Vital signs stable prior to discharge. Stable to discharge ambulatory.

## 2020-06-27 NOTE — Patient Instructions (Signed)
[facility] Chemotherapy Teaching   You are diagnosed with Stage IIIa right lung squamous cell carcinoma (cancer).  You will be treated in the clinic one day every week in conjunction with radiation therapy five days a week.  The chemotherapy drugs you will receive in the clinic are paclitaxel (Taxol) and carboplatin.  The intent of this treatment is cure.  You will see the doctor regularly throughout treatment.  We will obtain blood work from you prior to every treatment and monitor your results to make sure it is safe to give your treatment. The doctor monitors your response to treatment by the way you are feeling, your blood work, and by obtaining scans periodically.  There will be wait times while you are here for treatment.  It will take about 30 minutes to 1 hour for your lab work to result.  Then there will be wait times while pharmacy mixes your medications.    Medications you will receive in the clinic prior to your chemotherapy medications:  Aloxi:  ALOXI is used in adults to help prevent the nausea and vomiting that happens with certain chemotherapy drugs.  Aloxi is a long acting medication, and will remain in your system for about 2 days.   Dexamethasone:  This is a steroid given prior to chemotherapy to help prevent allergic reactions; it may also help prevent and control nausea and diarrhea.   Pepcid:  This medication is a histamine blocker that helps prevent and allergic reaction to your chemotherapy.   Benadryl:  This is a histamine blocker (different from the Pepcid) that helps prevent allergic/infusion reactions to your chemotherapy. This medication may cause dizziness/drowsiness.    Paclitaxel (Taxol)  About This Drug  Paclitaxel is a drug used to treat cancer. It is given in the vein (IV).  This will take ** hour to infuse.  This first infusion will take longer because it is increased slowly to monitor for reactions.  The nurse will be in the room with you for  the first 15 minutes of the first infusion.  Possible Side Effects  . Hair loss. Hair loss is often temporary, although with certain medicine, hair loss can sometimes be permanent. Hair loss may happen suddenly or gradually. If you lose hair, you may lose it from your head, face, armpits, pubic area, chest, and/or legs. You may also notice your hair getting thin.  . Swelling of your legs, ankles and/or feet (edema)  . Flushing  . Nausea and throwing up (vomiting)  . Loose bowel movements (diarrhea)  . Bone marrow depression. This is a decrease in the number of white blood cells, red blood cells, and platelets. This may raise your risk of infection, make you tired and weak (fatigue), and raise your risk of bleeding.  . Effects on the nerves are called peripheral neuropathy. You may feel numbness, tingling, or pain in your hands and feet. It may be hard for you to button your clothes, open jars, or walk as usual. The effect on the nerves may get worse with more doses of the drug. These effects get better in some people after the drug is stopped but it does not get better in all people.  . Changes in your liver function  . Bone, joint and muscle pain  . Abnormal EKG  . Allergic reaction: Allergic reactions, including anaphylaxis are rare but may happen in some patients. Signs of allergic reaction to this drug may be swelling of the face, feeling like your tongue  or throat are swelling, trouble breathing, rash, itching, fever, chills, feeling dizzy, and/or feeling that your heart is beating in a fast or not normal way. If this happens, do not take another dose of this drug. You should get urgent medical treatment.  . Infection  . Changes in your kidney function.  Note: Each of the side effects above was reported in 20% or greater of patients treated with paclitaxel. Not all possible side effects are included above.   Warnings and Precautions  . Severe allergic reactions  . Severe  bone marrow depression   Treating Side Effects  . To help with hair loss, wash with a mild shampoo and avoid washing your hair every day.  . Avoid rubbing your scalp, instead, pat your hair or scalp dry  . Avoid coloring your hair  . Limit your use of hair spray, electric curlers, blow dryers, and curling irons.  . If you are interested in getting a wig, talk to your nurse. You can also call the [facility] at 800-ACS-2345 to find out information about the "Look Good, Feel Better" program close to where you live. It is a free program where women getting chemotherapy can learn about wigs, turbans and scarves as well as makeup techniques and skin and nail care.  . Ask your doctor or nurse about medicines that are available to help stop or lessen diarrhea and/or nausea.  . To help with nausea and vomiting, eat small, frequent meals instead of three large meals a day. Choose foods and drinks that are at room temperature. Ask your nurse or doctor about other helpful tips and medicine that is available to help or stop lessen these symptoms.  . If you get diarrhea, eat low-fiber foods that are high in protein and calories and avoid foods that can irritate your digestive tracts or lead to cramping. Ask your nurse or doctor about medicine that can lessen or stop your diarrhea.  . Mouth care is very important. Your mouth care should consist of routine, gentle cleaning of your teeth or dentures and rinsing your mouth with a mixture of 1/2 teaspoon of salt in 8 ounces of water or  teaspoon of baking soda in 8 ounces of water. This should be done at least after each meal and at bedtime.  . If you have mouth sores, avoid mouthwash that has alcohol. Also avoid alcohol and smoking because they can bother your mouth and throat.  . Drink plenty of fluids (a minimum of eight glasses per day is recommended).  . Take your temperature as your doctor or nurse tells you, and whenever you feel like  you may have a fever.  . Talk to your doctor or nurse about precautions you can take to avoid infections and bleeding.  . Be careful when cooking, walking, and handling sharp objects and hot liquids.  Food and Drug Interactions  . There are no known interactions of paclitaxel with food.  . This drug may interact with other medicines. Tell your doctor and pharmacist about all the medicines and dietary supplements (vitamins, minerals, herbs and others) that you are taking at this time.  . The safety and use of dietary supplements and alternative diets are often not known. Using these might affect your cancer or interfere with your treatment. Until more is known, you should not use dietary supplements or alternative diets without your cancer doctor's help.  When to Call the Doctor  Call your doctor or nurse if you have any of  the following symptoms and/or any new or unusual symptoms:  . Fever of 100.4 F (38 C) or above  . Chills  . Redness, pain, warmth, or swelling at the IV site during the infusion  . Signs of allergic reaction: swelling of the face, feeling like your tongue or throat are swelling, trouble breathing, rash, itching, fever, chills, feeling dizzy, and/or feeling that your heart is beating in a fast or not normal way  . Feeling that your heart is beating in a fast or not normal way (palpitations)  . Weight gain of 5 pounds in one week (fluid retention)  . Decreased urine or very dark urine  . Signs of liver problems: dark urine, pale bowel movements, bad stomach pain, feeling very tired and weak, unusual  itching, or yellowing of the eyes or skin  . Heavy menstrual period that lasts longer than normal  . Easy bruising or bleeding  . Nausea that stops you from eating or drinking, and/or that is not relieved by prescribed medicines.  . Loose bowel movements (diarrhea) more than 4 times a day or diarrhea with weakness or lightheadedness  . Pain in your mouth or  throat that makes it hard to eat or drink  . Lasting loss of appetite or rapid weight loss of five pounds in a week  . Signs of peripheral neuropathy: numbness, tingling, or decreased feeling in fingers or toes; trouble walking or changes in the way you walk; or feeling clumsy when buttoning clothes, opening jars, or other routine activities  . Joint and muscle pain that is not relieved by prescribed medicines  . Extreme fatigue that interferes with normal activities  . While you are getting this drug, please tell your nurse right away if you have any pain, redness, or swelling at the site of the IV infusion.  . If you think you are pregnant.  Reproduction Warnings  . Pregnancy warning: This drug may have harmful effects on the unborn child, it is recommended that effective methods of birth control should be used during your cancer treatment. Let your doctor know right away if you think you may be pregnant.  . Breast feeding warning: Women should not breast feed during treatment because this drug could enter the breastmilk and cause harm to a breast feeding baby.   Carboplatin (Paraplatin, CBDCA)  About This Drug  Carboplatin is used to treat cancer. It is given in the vein through your port a cath.  It will take 30 minutes to infuse. You will receive this medication every 3 weeks.   Possible Side Effects  . Bone marrow suppression. This is a decrease in the number of white blood cells, red blood cells, and platelets. This may raise your risk of infection, make you tired and weak (fatigue), and raise your risk of bleeding.  . Nausea and vomiting (throwing up)  . Weakness  . Changes in your liver function  . Changes in your kidney function  . Electrolyte changes  . Pain  Note: Each of the side effects above was reported in 20% or greater of patients treated with carboplatin. Not all possible side effects are included above.   Warnings and Precautions  . Severe bone  marrow suppression  . Allergic reactions, including anaphylaxis are rare but may happen in some patients. Signs of allergic reaction to this drug may be swelling of the face, feeling like your tongue or throat are swelling, trouble breathing, rash, itching, fever, chills, feeling dizzy, and/or feeling that your heart  is beating in a fast or not normal way. If this happens, do not take another dose of this drug. You should get urgent medical treatment.  . Severe nausea and vomiting  . Effects on the nerves are called peripheral neuropathy. This risk is increased if you are over the age of 45 or if you have received other medicine with risk of peripheral neuropathy. You may feel numbness, tingling, or pain in your hands and feet. It may be hard for you to button your clothes, open jars, or walk as usual. The effect on the nerves may get worse with more doses of the drug. These effects get better in some people after the drug is stopped but it does not get better in all people.  Marland Kitchen Blurred vision, loss of vision or other changes in eyesight  . Decreased hearing  . Skin and tissue irritation including redness, pain, warmth, or swelling at the IV site if the drug leaks out of the vein and into nearby tissue.  . Severe changes in your kidney function, which can cause kidney failure  . Severe changes in your liver function, which can cause liver failure  Note: Some of the side effects above are very rare. If you have concerns and/or questions, please discuss them with your medical team.  Important Information  . This drug may be present in the saliva, tears, sweat, urine, stool, vomit, semen, and vaginal secretions. Talk to your doctor and/or your nurse about the necessary precautions to take during this time.  Treating Side Effects  . Manage tiredness by pacing your activities for the day.  . Be sure to include periods of rest between energy-draining activities.  . To decrease the risk of  infection, wash your hands regularly.  . Avoid close contact with people who have a cold, the flu, or other infections.  . Take your temperature as your doctor or nurse tells you, and whenever you feel like you may have a fever.  . To help decrease the risk of bleeding, use a soft toothbrush. Check with your nurse before using dental floss.  . Be very careful when using knives or tools.  . Use an electric shaver instead of a razor.  . Drink plenty of fluids (a minimum of eight glasses per day is recommended).  . If you throw up or have loose bowel movements, you should drink more fluids so that you do not become dehydrated (lack of water in the body from losing too much fluid).  . To help with nausea and vomiting, eat small, frequent meals instead of three large meals a day.  Choose foods and drinks that are at room temperature. Ask your nurse or doctor about other helpful tips and medicine that is available to help stop or lessen these symptoms.  . If you have numbness and tingling in your hands and feet, be careful when cooking, walking, and handling sharp objects and hot liquids.  Marland Kitchen Keeping your pain under control is important to your well-being. Please tell your doctor or nurse if you are experiencing pain.  Food and Drug Interactions  . There are no known interactions of carboplatin with food.  . This drug may interact with other medicines. Tell your doctor and pharmacist about all the prescription and over-the-counter medicines and dietary supplements (vitamins, minerals, herbs and others) that you are taking at this time. Also, check with your doctor or pharmacist before starting any new prescription or over-the-counter medicines, or dietary supplements to make sure  that there are no interactions.  When to Call the Doctor  Call your doctor or nurse if you have any of these symptoms and/or any new or unusual symptoms:  . Fever of 100.4 F (38 C) or higher  . Chills  .  Tiredness that interferes with your daily activities  . Feeling dizzy or lightheaded  . Easy bleeding or bruising  . Nausea that stops you from eating or drinking and/or is not relieved by prescribed medicines  . Throwing up/vomiting  . Blurred vision or other changes in eyesight  . Decrease in hearing or ringing in the ear  . Signs of allergic reaction: swelling of the face, feeling like your tongue or throat are swelling, trouble breathing, rash, itching, fever, chills, feeling dizzy, and/or feeling that your heart is beating in a fast or not normal way. If this happens, call 911 for emergency care.  . While you are getting this drug, please tell your nurse right away if you have any pain, redness, or swelling at the site of the IV infusion  . Signs of possible liver problems: dark urine, pale bowel movements, bad stomach pain, feeling very tired and weak, unusual itching, or yellowing of the eyes or skin  . Decreased urine, or very dark urine  . Numbness, tingling, or pain in your hands and feet  . Pain that does not go away or is not relieved by prescribed medicine  . If you think you may be pregnant  Reproduction Warnings  . Pregnancy warning: This drug may have harmful effects on the unborn baby. Women of child bearing potential should use effective methods of birth control during your cancer treatment. Let your doctor know right away if you think you may be pregnant.  . Breastfeeding warning: It is not known if this drug passes into breast milk. For this reason, women should not breastfeed during treatment because this drug could enter the breast milk and cause harm to a breastfeeding baby.  . Fertility warning: Human fertility studies have not been done with this drug. Talk with your doctor or nurse if you plan to have children. Ask for information on sperm or egg banking.  SELF CARE ACTIVITIES WHILE RECEIVING CHEMOTHERAPY:  Hydration Increase your fluid intake 48 hours  prior to treatment and drink at least 8 to 12 cups (64 ounces) of water/decaffeinated beverages per day after treatment. You can still have your cup of coffee or soda but these beverages do not count as part of your 8 to 12 cups that you need to drink daily. No alcohol intake.  Medications Continue taking your normal prescription medication as prescribed.  If you start any new herbal or new supplements please let us know first to make sure it is safe.  Mouth Care Have teeth cleaned professionally before starting treatment. Keep dentures and partial plates clean. Use soft toothbrush and do not use mouthwashes that contain alcohol. Biotene is a good mouthwash that is available at most pharmacies or may be ordered by calling [phone]. Use warm salt water gargles (1 teaspoon salt per 1 quart warm water) before and after meals and at bedtime. If you need dental work, please let the doctor know before you go for your appointment so that we can coordinate the best possible time for you in regards to your chemo regimen. You need to also let your dentist know that you are actively taking chemo. We may need to do labs prior to your dental appointment.  Skin Care  Always use sunscreen that has not expired and with SPF (Sun Protection Factor) of 50 or higher. Wear hats to protect your head from the sun. Remember to use sunscreen on your hands, ears, face, & feet.  Use good moisturizing lotions such as udder cream, eucerin, or even Vaseline. Some chemotherapies can cause dry skin, color changes in your skin and nails.    . Avoid long, hot showers or baths. . Use gentle, fragrance-free soaps and laundry detergent. . Use moisturizers, preferably creams or ointments rather than lotions because the thicker consistency is better at preventing skin dehydration. Apply the cream or ointment within 15 minutes of showering. Reapply moisturizer at night, and moisturize your hands every time after you wash them.  Hair  Loss (if your doctor says your hair will fall out)  . If your doctor says that your hair is likely to fall out, decide before you begin chemo whether you want to wear a wig. You may want to shop before treatment to match your hair color. . Hats, turbans, and scarves can also camouflage hair loss, although some people prefer to leave their heads uncovered. If you go bare-headed outdoors, be sure to use sunscreen on your scalp. . Cut your hair short. It eases the inconvenience of shedding lots of hair, but it also can reduce the emotional impact of watching your hair fall out. . Don't perm or color your hair during chemotherapy. Those chemical treatments are already damaging to hair and can enhance hair loss. Once your chemo treatments are done and your hair has grown back, it's OK to resume dyeing or perming hair.  With chemotherapy, hair loss is almost always temporary. But when it grows back, it may be a different color or texture. In older adults who still had hair color before chemotherapy, the new growth may be completely gray.  Often, new hair is very fine and soft.  Infection Prevention Please wash your hands for at least 30 seconds using warm soapy water. Handwashing is the #1 way to prevent the spread of germs. Stay away from sick people or people who are getting over a cold. If you develop respiratory systems such as green/yellow mucus production or productive cough or persistent cough let us know and we will see if you need an antibiotic. It is a good idea to keep a pair of gloves on when going into grocery stores/[employer] to decrease your risk of coming into contact with germs on the carts, etc. Carry alcohol hand gel with you at all times and use it frequently if out in public. If your temperature reaches 100.4 or higher please call the clinic and let us know.  If it is after hours or on the weekend please go to the ER if your temperature is over 100.4.  Please have your own personal thermometer  at home to use.    Sex and bodily fluids If you are going to have sex, a condom must be used to protect the person that isn't taking chemotherapy. Chemo can decrease your libido (sex drive). For a few days after chemotherapy, chemotherapy can be excreted through your bodily fluids.  When using the toilet please close the lid and flush the toilet twice.  Do this for a few day after you have had chemotherapy.   Effects of chemotherapy on your sex life Some changes are simple and won't last long. They won't affect your sex life permanently.  Sometimes you may feel: . too tired . not strong enough  to be very active . sick or sore  . not in the mood . anxious or low  Your anxiety might not seem related to sex. For example, you may be worried about the cancer and how your treatment is going. Or you may be worried about money, or about how you family are coping with your illness.  These things can cause stress, which can affect your interest in sex. It's important to talk to your partner about how you feel.  Remember - the changes to your sex life don't usually last long. There's usually no medical reason to stop having sex during chemo. The drugs won't have any long term physical effects on your performance or enjoyment of sex. Cancer can't be passed on to your partner during sex  Contraception It's important to use reliable contraception during treatment. Avoid getting pregnant while you or your partner are having chemotherapy. This is because the drugs may harm the baby. Sometimes chemotherapy drugs can leave a man or woman infertile.  This means you would not be able to have children in the future. You might want to talk to someone about permanent infertility. It can be very difficult to learn that you may no longer be able to have children. Some people find counselling helpful. There might be ways to preserve your fertility, although this is easier for men than for women. You may want to speak to a  fertility expert. You can talk about sperm banking or harvesting your eggs. You can also ask about other fertility options, such as donor eggs. If you have or have had breast cancer, your doctor might advise you not to take the contraceptive pill. This is because the hormones in it might affect the cancer. It is not known for sure whether or not chemotherapy drugs can be passed on through semen or secretions from the vagina. Because of this some doctors advise people to use a barrier method if you have sex during treatment. This applies to vaginal, anal or oral sex. Generally, doctors advise a barrier method only for the time you are actually having the treatment and for about a week after your treatment. Advice like this can be worrying, but this does not mean that you have to avoid being intimate with your partner. You can still have close contact with your partner and continue to enjoy sex.  Animals If you have cats or birds we just ask that you not change the litter or change the cage.  Please have someone else do this for you while you are on chemotherapy.   Food Safety During and After Cancer Treatment Food safety is important for people both during and after cancer treatment. Cancer and cancer treatments, such as chemotherapy, radiation therapy, and stem cell/bone marrow transplantation, often weaken the immune system. This makes it harder for your body to protect itself from foodborne illness, also called food poisoning. Foodborne illness is caused by eating food that contains harmful bacteria, parasites, or viruses.  Foods to avoid Some foods have a higher risk of becoming tainted with bacteria. These include: Marland Kitchen Unwashed fresh fruit and vegetables, especially leafy vegetables that can hide dirt and other contaminants . Raw sprouts, such as alfalfa sprouts . Raw or undercooked beef, especially ground beef, or other raw or undercooked meat and poultry . Fatty, fried, or spicy foods immediately  before or after treatment.  These can sit heavy on your stomach and make you feel nauseous. . Raw or undercooked shellfish, such as oysters. Marland Kitchen  Sushi and sashimi, which often contain raw fish.  . Unpasteurized beverages, such as unpasteurized fruit juices, raw milk, raw yogurt, or cider . Undercooked eggs, such as soft boiled, over easy, and poached; raw, unpasteurized eggs; or foods made with raw egg, such as homemade raw cookie dough and homemade mayonnaise  Simple steps for food safety  Shop smart. . Do not buy food stored or displayed in an unclean area. . Do not buy bruised or damaged fruits or vegetables. . Do not buy cans that have cracks, dents, or bulges. . Pick up foods that can spoil at the end of your shopping trip and store them in a cooler on the way home.  Prepare and clean up foods carefully. . Rinse all fresh fruits and vegetables under running water, and dry them with a clean towel or paper towel. . Clean the top of cans before opening them. . After preparing food, wash your hands for 20 seconds with hot water and soap. Pay special attention to areas between fingers and under nails. . Clean your utensils and dishes with hot water and soap. Marland Kitchen Disinfect your kitchen and cutting boards using 1 teaspoon of liquid, unscented bleach mixed into 1 quart of water.    Dispose of old food. . Eat canned and packaged food before its expiration date (the "use by" or "best before" date). . Consume refrigerated leftovers within 3 to 4 days. After that time, throw out the food. Even if the food does not smell or look spoiled, it still may be unsafe. Some bacteria, such as Listeria, can grow even on foods stored in the refrigerator if they are kept for too long.  Take precautions when eating out. . At restaurants, avoid buffets and salad bars where food sits out for a long time and comes in contact with many people. Food can become contaminated when someone with a virus, often a norovirus,  or another "bug" handles it. . Put any leftover food in a "to-go" container yourself, rather than having the server do it. And, refrigerate leftovers as soon as you get home. . Choose restaurants that are clean and that are willing to prepare your food as you order it cooked.   AT HOME MEDICATIONS:                                                                                                                                                                Compazine/Prochlorperazine 10mg  tablet. Take 1 tablet every 6 hours as needed for nausea/vomiting. (This can make you sleepy)   EMLA cream. Apply a quarter size amount to port site 1 hour prior to chemo. Do not rub in. Cover with plastic wrap.    Diarrhea Sheet   If you are having loose stools/diarrhea, please purchase  Imodium and begin taking as outlined:  At the first sign of poorly formed or loose stools you should begin taking Imodium (loperamide) 2 mg capsules.  Take two tablets (4mg ) followed by one tablet (2mg ) every 2 hours - DO NOT EXCEED 8 tablets in 24 hours.  If it is bedtime and you are having loose stools, take 2 tablets at bedtime, then 2 tablets every 4 hours until morning.   Always call the [facility] if you are having loose stools/diarrhea that you can't get under control.  Loose stools/diarrhea leads to dehydration (loss of water) in your body.  We have other options of trying to get the loose stools/diarrhea to stop but you must let us know!   Constipation Sheet  Colace - 100 mg capsules - take 2 capsules daily.  If this doesn't help then you can increase to 2 capsules twice daily.  Please call if the above does not work for you. Do not go more than 2 days without a bowel movement.  It is very important that you do not become constipated.  It will make you feel sick to your stomach (nausea) and can cause abdominal pain and vomiting.  Nausea Sheet   Compazine/Prochlorperazine 10mg  tablet. Take 1 tablet every 6 hours  as needed for nausea/vomiting (This can make you drowsy).  If you are having persistent nausea (nausea that does not stop) please call the [facility] and let us know the amount of nausea that you are experiencing.  If you begin to vomit, you need to call the [facility] and if it is the weekend and you have vomited more than one time and can't get it to stop-go to the Emergency Room.  Persistent nausea/vomiting can lead to dehydration (loss of fluid in your body) and will make you feel very weak and unwell. Ice chips, sips of clear liquids, foods that are at room temperature, crackers, and toast tend to be better tolerated.   SYMPTOMS TO REPORT AS SOON AS POSSIBLE AFTER TREATMENT:  FEVER GREATER THAN 100.4 F  CHILLS WITH OR WITHOUT FEVER  NAUSEA AND VOMITING THAT IS NOT CONTROLLED WITH YOUR NAUSEA MEDICATION  UNUSUAL SHORTNESS OF BREATH  UNUSUAL BRUISING OR BLEEDING  TENDERNESS IN MOUTH AND THROAT WITH OR WITHOUT PRESENCE OF ULCERS  URINARY PROBLEMS  BOWEL PROBLEMS  UNUSUAL RASH    Wear comfortable clothing and clothing appropriate for easy access to any Portacath or PICC line. Let us know if there is anything that we can do to make your therapy better!    What to do if you need assistance after hours or on the weekends: CALL [phone].  HOLD on the line, do not hang up.  You will hear multiple messages but at the end you will be connected with a nurse triage line.  They will contact the doctor if necessary.  Most of the time they will be able to assist you.  Do not call the hospital operator.      I have been informed and understand all of the instructions given to me and have received a copy. I have been instructed to call the clinic [phone] or my family physician as soon as possible for continued medical care, if indicated. I do not have any more questions at this time but understand that I may call the [facility] or the Patient Navigator at [phone]  during office hours should I have questions or need assistance in obtaining follow-up care.
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
## 2020-06-27 NOTE — Patient Instructions (Signed)
[facility] Discharge Instructions  Labs drawn from portacath today   Thank you for choosing  Cancer Center at [facility] to provide your oncology and hematology care.  To afford each patient quality time with our provider, please arrive at least 15 minutes before your scheduled appointment time.   If you have a lab appointment with the Cancer Center please come in thru the Main Entrance and check in at the main information desk.  You need to re-schedule your appointment should you arrive 10 or more minutes late.  We strive to give you quality time with our providers, and arriving late affects you and other patients whose appointments are after yours.  Also, if you no show three or more times for appointments you may be dismissed from the clinic at the providers discretion.     Again, thank you for choosing [facility].  Our hope is that these requests will decrease the amount of time that you wait before being seen by our physicians.       _____________________________________________________________  Should you have questions after your visit to  Cancer Center, please contact our office at [phone] and follow the prompts.  Our office hours are 8:00 a.m. and 4:30 p.m. Monday - Friday.  Please note that voicemails left after 4:00 p.m. may not be returned until the following business day.  We are closed weekends and major holidays.  You do have access to a nurse 24-7, just call the main number to the clinic [phone] and do not press any options, hold on the line and a nurse will answer the phone.    For prescription refill requests, have your pharmacy contact our office and allow 72 hours.    Due to Covid, you will need to wear a mask upon entering the hospital. If you do not have a mask, a mask will be given to you at the Main Entrance upon arrival. For doctor visits, patients may have 1 support person age 18  or older with them. For treatment visits, patients can not have anyone with them due to social distancing guidelines and our immunocompromised population.     

## 2020-06-27 NOTE — Patient Instructions (Signed)
[place] at [facility] Discharge Instructions  You were seen today by Dr. Delton Coombes. He went over your recent results. You received your first treatment today. Some side effects to be aware of include fatigue, nausea, worsening numbness in hands and feet, and weight loss. Dr. Delton Coombes will see you back in 1 week for labs and follow up.   Thank you for choosing [place] at [facility] to provide your oncology and hematology care.  To afford each patient quality time with our provider, please arrive at least 15 minutes before your scheduled appointment time.   If you have a lab appointment with the [facility] please come in thru the Main Entrance and check in at the main information desk  You need to re-schedule your appointment should you arrive 10 or more minutes late.  We strive to give you quality time with our providers, and arriving late affects you and other patients whose appointments are after yours.  Also, if you no show three or more times for appointments you may be dismissed from the clinic at the providers discretion.     Again, thank you for choosing [facility].  Our hope is that these requests will decrease the amount of time that you wait before being seen by our physicians.       _____________________________________________________________  Should you have questions after your visit to [facility] please contact our office at [phone] between the hours of 8:00 a.m. and 4:30 p.m.  Voicemails left after 4:00 p.m. will not be returned until the following business day.  For prescription refill requests, have your pharmacy contact our office and allow 72 hours.    Cancer Center Support Programs:   > Cancer Support Group  2nd Tuesday of the month 1pm-2pm, Journey Room

## 2020-06-27 NOTE — Progress Notes (Signed)
Patient was assessed by Dr. Delton Coombes and labs have been reviewed.  Creatinine 1.27, patient is okay to proceed with treatment today. Primary RN and pharmacy aware.

## 2020-06-28 ENCOUNTER — Telehealth (HOSPITAL_COMMUNITY): Payer: Self-pay

## 2020-06-28 DIAGNOSIS — I2510 Atherosclerotic heart disease of native coronary artery without angina pectoris: Secondary | ICD-10-CM | POA: Diagnosis not present

## 2020-06-28 DIAGNOSIS — C801 Malignant (primary) neoplasm, unspecified: Secondary | ICD-10-CM | POA: Diagnosis not present

## 2020-06-28 DIAGNOSIS — C7801 Secondary malignant neoplasm of right lung: Secondary | ICD-10-CM | POA: Diagnosis not present

## 2020-06-28 DIAGNOSIS — C3431 Malignant neoplasm of lower lobe, right bronchus or lung: Secondary | ICD-10-CM | POA: Diagnosis not present

## 2020-06-28 DIAGNOSIS — I10 Essential (primary) hypertension: Secondary | ICD-10-CM | POA: Diagnosis not present

## 2020-06-28 NOTE — Telephone Encounter (Signed)
24 hour follow up phone call- unable to leave a message at this time.

## 2020-07-01 DIAGNOSIS — C3431 Malignant neoplasm of lower lobe, right bronchus or lung: Secondary | ICD-10-CM | POA: Diagnosis not present

## 2020-07-01 DIAGNOSIS — C7801 Secondary malignant neoplasm of right lung: Secondary | ICD-10-CM | POA: Diagnosis not present

## 2020-07-01 DIAGNOSIS — C801 Malignant (primary) neoplasm, unspecified: Secondary | ICD-10-CM | POA: Diagnosis not present

## 2020-07-02 DIAGNOSIS — C7801 Secondary malignant neoplasm of right lung: Secondary | ICD-10-CM | POA: Diagnosis not present

## 2020-07-02 DIAGNOSIS — C3431 Malignant neoplasm of lower lobe, right bronchus or lung: Secondary | ICD-10-CM | POA: Diagnosis not present

## 2020-07-02 DIAGNOSIS — C801 Malignant (primary) neoplasm, unspecified: Secondary | ICD-10-CM | POA: Diagnosis not present

## 2020-07-03 DIAGNOSIS — C7801 Secondary malignant neoplasm of right lung: Secondary | ICD-10-CM | POA: Diagnosis not present

## 2020-07-03 DIAGNOSIS — C801 Malignant (primary) neoplasm, unspecified: Secondary | ICD-10-CM | POA: Diagnosis not present

## 2020-07-03 DIAGNOSIS — C3431 Malignant neoplasm of lower lobe, right bronchus or lung: Secondary | ICD-10-CM | POA: Diagnosis not present

## 2020-07-04 ENCOUNTER — Inpatient Hospital Stay (HOSPITAL_BASED_OUTPATIENT_CLINIC_OR_DEPARTMENT_OTHER): Payer: Medicare Other | Admitting: Hematology

## 2020-07-04 ENCOUNTER — Inpatient Hospital Stay (HOSPITAL_COMMUNITY): Payer: Medicare Other

## 2020-07-04 ENCOUNTER — Other Ambulatory Visit: Payer: Self-pay

## 2020-07-04 ENCOUNTER — Inpatient Hospital Stay (HOSPITAL_COMMUNITY): Payer: Medicare Other | Attending: Hematology

## 2020-07-04 VITALS — BP 131/75 | HR 72 | Temp 96.8°F | Resp 17

## 2020-07-04 VITALS — BP 128/71 | HR 78 | Temp 97.1°F | Resp 18 | Wt 168.1 lb

## 2020-07-04 DIAGNOSIS — Z923 Personal history of irradiation: Secondary | ICD-10-CM | POA: Insufficient documentation

## 2020-07-04 DIAGNOSIS — C3491 Malignant neoplasm of unspecified part of right bronchus or lung: Secondary | ICD-10-CM

## 2020-07-04 DIAGNOSIS — N189 Chronic kidney disease, unspecified: Secondary | ICD-10-CM | POA: Diagnosis not present

## 2020-07-04 DIAGNOSIS — G629 Polyneuropathy, unspecified: Secondary | ICD-10-CM | POA: Diagnosis not present

## 2020-07-04 DIAGNOSIS — Z5111 Encounter for antineoplastic chemotherapy: Secondary | ICD-10-CM | POA: Diagnosis not present

## 2020-07-04 DIAGNOSIS — Z9221 Personal history of antineoplastic chemotherapy: Secondary | ICD-10-CM | POA: Diagnosis not present

## 2020-07-04 DIAGNOSIS — Z933 Colostomy status: Secondary | ICD-10-CM | POA: Diagnosis not present

## 2020-07-04 DIAGNOSIS — C3431 Malignant neoplasm of lower lobe, right bronchus or lung: Secondary | ICD-10-CM | POA: Diagnosis not present

## 2020-07-04 DIAGNOSIS — C20 Malignant neoplasm of rectum: Secondary | ICD-10-CM | POA: Diagnosis not present

## 2020-07-04 DIAGNOSIS — D72819 Decreased white blood cell count, unspecified: Secondary | ICD-10-CM | POA: Insufficient documentation

## 2020-07-04 DIAGNOSIS — F17210 Nicotine dependence, cigarettes, uncomplicated: Secondary | ICD-10-CM | POA: Insufficient documentation

## 2020-07-04 DIAGNOSIS — C801 Malignant (primary) neoplasm, unspecified: Secondary | ICD-10-CM | POA: Diagnosis not present

## 2020-07-04 DIAGNOSIS — E8809 Other disorders of plasma-protein metabolism, not elsewhere classified: Secondary | ICD-10-CM | POA: Diagnosis not present

## 2020-07-04 DIAGNOSIS — M25552 Pain in left hip: Secondary | ICD-10-CM | POA: Insufficient documentation

## 2020-07-04 DIAGNOSIS — Z23 Encounter for immunization: Secondary | ICD-10-CM | POA: Insufficient documentation

## 2020-07-04 DIAGNOSIS — Z85048 Personal history of other malignant neoplasm of rectum, rectosigmoid junction, and anus: Secondary | ICD-10-CM | POA: Insufficient documentation

## 2020-07-04 DIAGNOSIS — D696 Thrombocytopenia, unspecified: Secondary | ICD-10-CM | POA: Insufficient documentation

## 2020-07-04 DIAGNOSIS — R1310 Dysphagia, unspecified: Secondary | ICD-10-CM | POA: Insufficient documentation

## 2020-07-04 DIAGNOSIS — C7801 Secondary malignant neoplasm of right lung: Secondary | ICD-10-CM | POA: Diagnosis not present

## 2020-07-04 DIAGNOSIS — Z95828 Presence of other vascular implants and grafts: Secondary | ICD-10-CM

## 2020-07-04 LAB — COMPREHENSIVE METABOLIC PANEL
ALT: 13 U/L (ref 0–44)
AST: 16 U/L (ref 15–41)
Albumin: 3.5 g/dL (ref 3.5–5.0)
Alkaline Phosphatase: 57 U/L (ref 38–126)
Anion gap: 6 (ref 5–15)
BUN: 11 mg/dL (ref 6–20)
CO2: 26 mmol/L (ref 22–32)
Calcium: 8.9 mg/dL (ref 8.9–10.3)
Chloride: 104 mmol/L (ref 98–111)
Creatinine, Ser: 1.12 mg/dL (ref 0.61–1.24)
GFR calc non Af Amer: >60 mL/min (ref >60)
Glucose, Bld: 89 mg/dL (ref 70–99)
Potassium: 4.3 mmol/L (ref 3.5–5.1)
Sodium: 136 mmol/L (ref 135–145)
Total Bilirubin: 0.9 mg/dL (ref 0.3–1.2)
Total Protein: 7.3 g/dL (ref 6.5–8.1)

## 2020-07-04 LAB — CBC WITH DIFFERENTIAL/PLATELET
Abs Immature Granulocytes: 0.05 10*3/uL (ref 0.00–0.07)
Basophils Absolute: 0 10*3/uL (ref 0.0–0.1)
Basophils Relative: 1 %
Eosinophils Absolute: 0.2 10*3/uL (ref 0.0–0.5)
Eosinophils Relative: 3 %
HCT: 47.5 % (ref 39.0–52.0)
Hemoglobin: 15.8 g/dL (ref 13.0–17.0)
Immature Granulocytes: 1 %
Lymphocytes Relative: 14 %
Lymphs Abs: 0.7 10*3/uL (ref 0.7–4.0)
MCH: 33.9 pg (ref 26.0–34.0)
MCHC: 33.3 g/dL (ref 30.0–36.0)
MCV: 101.9 fL — ABNORMAL HIGH (ref 80.0–100.0)
Monocytes Absolute: 0.3 10*3/uL (ref 0.1–1.0)
Monocytes Relative: 5 %
Neutro Abs: 4.1 10*3/uL (ref 1.7–7.7)
Neutrophils Relative %: 76 %
Platelets: 186 10*3/uL (ref 150–400)
RBC: 4.66 MIL/uL (ref 4.22–5.81)
RDW: 13.9 % (ref 11.5–15.5)
WBC: 5.3 10*3/uL (ref 4.0–10.5)
nRBC: 0 % (ref 0.0–0.2)

## 2020-07-04 MED ORDER — DIPHENHYDRAMINE HCL 50 MG/ML IJ SOLN
50.0000 mg | Freq: Once | INTRAMUSCULAR | Status: AC
  Administered 2020-07-04: 50 mg via INTRAVENOUS
  Filled 2020-07-04: qty 1

## 2020-07-04 MED ORDER — SODIUM CHLORIDE 0.9% FLUSH
10.0000 mL | INTRAVENOUS | Status: DC | PRN
  Administered 2020-07-04: 10 mL

## 2020-07-04 MED ORDER — PALONOSETRON HCL INJECTION 0.25 MG/5ML
0.2500 mg | Freq: Once | INTRAVENOUS | Status: AC
  Administered 2020-07-04: 0.25 mg via INTRAVENOUS
  Filled 2020-07-04: qty 5

## 2020-07-04 MED ORDER — SODIUM CHLORIDE 0.9 % IV SOLN
20.0000 mg | Freq: Once | INTRAVENOUS | Status: AC
  Administered 2020-07-04: 20 mg via INTRAVENOUS
  Filled 2020-07-04: qty 20

## 2020-07-04 MED ORDER — HEPARIN SOD (PORK) LOCK FLUSH 100 UNIT/ML IV SOLN
500.0000 [IU] | Freq: Once | INTRAVENOUS | Status: AC | PRN
  Administered 2020-07-04: 500 [IU]

## 2020-07-04 MED ORDER — SODIUM CHLORIDE 0.9 % IV SOLN
45.0000 mg/m2 | Freq: Once | INTRAVENOUS | Status: AC
  Administered 2020-07-04: 84 mg via INTRAVENOUS
  Filled 2020-07-04: qty 14

## 2020-07-04 MED ORDER — SODIUM CHLORIDE 0.9 % IV SOLN: Freq: Once | INTRAVENOUS | Status: AC

## 2020-07-04 MED ORDER — SODIUM CHLORIDE 0.9 % IV SOLN
201.4000 mg | Freq: Once | INTRAVENOUS | Status: AC
  Administered 2020-07-04: 200 mg via INTRAVENOUS
  Filled 2020-07-04: qty 20

## 2020-07-04 MED ORDER — FAMOTIDINE IN NACL 20-0.9 MG/50ML-% IV SOLN
20.0000 mg | Freq: Once | INTRAVENOUS | Status: AC
  Administered 2020-07-04: 20 mg via INTRAVENOUS
  Filled 2020-07-04: qty 50

## 2020-07-04 NOTE — Patient Instructions (Signed)
[facility] Discharge Instructions for Patients Receiving Chemotherapy  Today you received the following chemotherapy agents   To help prevent nausea and vomiting after your treatment, we encourage you to take your nausea medication   If you develop nausea and vomiting that is not controlled by your nausea medication, call the clinic.   BELOW ARE SYMPTOMS THAT SHOULD BE REPORTED IMMEDIATELY:  *FEVER GREATER THAN 100.5 F  *CHILLS WITH OR WITHOUT FEVER  NAUSEA AND VOMITING THAT IS NOT CONTROLLED WITH YOUR NAUSEA MEDICATION  *UNUSUAL SHORTNESS OF BREATH  *UNUSUAL BRUISING OR BLEEDING  TENDERNESS IN MOUTH AND THROAT WITH OR WITHOUT PRESENCE OF ULCERS  *URINARY PROBLEMS  *BOWEL PROBLEMS  UNUSUAL RASH Items with * indicate a potential emergency and should be followed up as soon as possible.  Feel free to call the clinic should you have any questions or concerns. The clinic phone number is [phone].  Please show the CHEMO ALERT CARD at check-in to the Emergency Department and triage nurse.   
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
## 2020-07-04 NOTE — Progress Notes (Signed)
Patient was assessed by Dr. Katragadda and labs have been reviewed.  Patient is okay to proceed with treatment today. Primary RN and pharmacy aware.   

## 2020-07-04 NOTE — Patient Instructions (Signed)
[place] at [facility] Discharge Instructions  You were seen today by Dr. Delton Coombes. He went over your recent results. You received your treatment today. If your cough starts producing a greenish sputum, please call the office immediately. Dr. Delton Coombes will see you back in 1 week for labs and follow up.   Thank you for choosing [place] at [facility] to provide your oncology and hematology care.  To afford each patient quality time with our provider, please arrive at least 15 minutes before your scheduled appointment time.   If you have a lab appointment with the [place] please come in thru the Main Entrance and check in at the main information desk  You need to re-schedule your appointment should you arrive 10 or more minutes late.  We strive to give you quality time with our providers, and arriving late affects you and other patients whose appointments are after yours.  Also, if you no show three or more times for appointments you may be dismissed from the clinic at the providers discretion.     Again, thank you for choosing [facility].  Our hope is that these requests will decrease the amount of time that you wait before being seen by our physicians.       _____________________________________________________________  Should you have questions after your visit to [facility] please contact our office at [phone] between the hours of 8:00 a.m. and 4:30 p.m.  Voicemails left after 4:00 p.m. will not be returned until the following business day.  For prescription refill requests, have your pharmacy contact our office and allow 72 hours.    Cancer Center Support Programs:   > Cancer Support Group  2nd Tuesday of the month 1pm-2pm, Journey Room

## 2020-07-04 NOTE — Progress Notes (Signed)
Patient presents today for treatment and follow up visit with Dr. Delton Coombes. Labs pending. Vital signs within parameters for treatment.   Patient denies any significant changes since his last visit. Patient states he experienced a chest tightness occasional after his last treatment and a cough. Patient denies any fever or chills. Patient states the cough is productive and when he coughs the chest tightness occurs. Patient states it resolved and he has had no problems for a couple of days. Per patient's words. Productive cough, thick, brown to white mucous per patient's words.   Message received from [facility] RN/ Dr. Delton Coombes to proceed with treatment at this time.   Treatment given today per MD orders. Tolerated infusion without adverse affects. Vital signs stable. No complaints at this time. Discharged from clinic ambulatory in stable condition. Alert and oriented x 3. F/U with [facility] as scheduled.

## 2020-07-04 NOTE — Patient Instructions (Signed)
[facility] at [facility] Discharge Instructions  Labs drawn from portacath today   Thank you for choosing [facility] at [facility] to provide your oncology and hematology care.  To afford each patient quality time with our provider, please arrive at least 15 minutes before your scheduled appointment time.   If you have a lab appointment with the Cancer Center please come in thru the Main Entrance and check in at the main information desk.  You need to re-schedule your appointment should you arrive 10 or more minutes late.  We strive to give you quality time with our providers, and arriving late affects you and other patients whose appointments are after yours.  Also, if you no show three or more times for appointments you may be dismissed from the clinic at the providers discretion.     Again, thank you for choosing [facility].  Our hope is that these requests will decrease the amount of time that you wait before being seen by our physicians.       _____________________________________________________________  Should you have questions after your visit to [facility], please contact our office at [phone] and follow the prompts.  Our office hours are 8:00 a.m. and 4:30 p.m. Monday - Friday.  Please note that voicemails left after 4:00 p.m. may not be returned until the following business day.  We are closed weekends and major holidays.  You do have access to a nurse 24-7, just call the main number to the clinic [phone] and do not press any options, hold on the line and a nurse will answer the phone.    For prescription refill requests, have your pharmacy contact our office and allow 72 hours.    Due to Covid, you will need to wear a mask upon entering the hospital. If you do not have a mask, a mask will be given to you at the Main Entrance upon arrival. For doctor visits, patients may have 1 support person age 18  or older with them. For treatment visits, patients can not have anyone with them due to social distancing guidelines and our immunocompromised population.     

## 2020-07-05 DIAGNOSIS — C7801 Secondary malignant neoplasm of right lung: Secondary | ICD-10-CM | POA: Diagnosis not present

## 2020-07-05 DIAGNOSIS — C3431 Malignant neoplasm of lower lobe, right bronchus or lung: Secondary | ICD-10-CM | POA: Diagnosis not present

## 2020-07-05 DIAGNOSIS — C801 Malignant (primary) neoplasm, unspecified: Secondary | ICD-10-CM | POA: Diagnosis not present

## 2020-07-08 ENCOUNTER — Other Ambulatory Visit (HOSPITAL_COMMUNITY): Payer: Self-pay | Admitting: *Deleted

## 2020-07-08 DIAGNOSIS — C801 Malignant (primary) neoplasm, unspecified: Secondary | ICD-10-CM | POA: Diagnosis not present

## 2020-07-08 DIAGNOSIS — C3431 Malignant neoplasm of lower lobe, right bronchus or lung: Secondary | ICD-10-CM | POA: Diagnosis not present

## 2020-07-08 DIAGNOSIS — C7801 Secondary malignant neoplasm of right lung: Secondary | ICD-10-CM | POA: Diagnosis not present

## 2020-07-08 MED ORDER — HYDROCODONE-ACETAMINOPHEN 5-325 MG PO TABS
1.0000 | ORAL_TABLET | Freq: Four times a day (QID) | ORAL | 0 refills | Status: DC | PRN
Start: 2020-07-08 — End: 2020-08-08

## 2020-07-09 DIAGNOSIS — C3431 Malignant neoplasm of lower lobe, right bronchus or lung: Secondary | ICD-10-CM | POA: Diagnosis not present

## 2020-07-09 DIAGNOSIS — C801 Malignant (primary) neoplasm, unspecified: Secondary | ICD-10-CM | POA: Diagnosis not present

## 2020-07-09 DIAGNOSIS — C7801 Secondary malignant neoplasm of right lung: Secondary | ICD-10-CM | POA: Diagnosis not present

## 2020-07-10 DIAGNOSIS — C7801 Secondary malignant neoplasm of right lung: Secondary | ICD-10-CM | POA: Diagnosis not present

## 2020-07-10 DIAGNOSIS — C3431 Malignant neoplasm of lower lobe, right bronchus or lung: Secondary | ICD-10-CM | POA: Diagnosis not present

## 2020-07-10 DIAGNOSIS — C801 Malignant (primary) neoplasm, unspecified: Secondary | ICD-10-CM | POA: Diagnosis not present

## 2020-07-11 ENCOUNTER — Other Ambulatory Visit: Payer: Self-pay

## 2020-07-11 ENCOUNTER — Inpatient Hospital Stay (HOSPITAL_BASED_OUTPATIENT_CLINIC_OR_DEPARTMENT_OTHER): Payer: Medicare Other | Admitting: Hematology

## 2020-07-11 ENCOUNTER — Inpatient Hospital Stay (HOSPITAL_COMMUNITY): Payer: Medicare Other

## 2020-07-11 VITALS — BP 108/77 | HR 81 | Temp 96.8°F | Resp 18 | Wt 169.2 lb

## 2020-07-11 VITALS — BP 121/78 | HR 77 | Temp 97.2°F | Resp 18

## 2020-07-11 DIAGNOSIS — Z923 Personal history of irradiation: Secondary | ICD-10-CM | POA: Diagnosis not present

## 2020-07-11 DIAGNOSIS — R1310 Dysphagia, unspecified: Secondary | ICD-10-CM | POA: Diagnosis not present

## 2020-07-11 DIAGNOSIS — C3491 Malignant neoplasm of unspecified part of right bronchus or lung: Secondary | ICD-10-CM

## 2020-07-11 DIAGNOSIS — Z5111 Encounter for antineoplastic chemotherapy: Secondary | ICD-10-CM | POA: Diagnosis not present

## 2020-07-11 DIAGNOSIS — G629 Polyneuropathy, unspecified: Secondary | ICD-10-CM | POA: Diagnosis not present

## 2020-07-11 DIAGNOSIS — Z95828 Presence of other vascular implants and grafts: Secondary | ICD-10-CM

## 2020-07-11 DIAGNOSIS — N189 Chronic kidney disease, unspecified: Secondary | ICD-10-CM | POA: Diagnosis not present

## 2020-07-11 DIAGNOSIS — F17210 Nicotine dependence, cigarettes, uncomplicated: Secondary | ICD-10-CM | POA: Diagnosis not present

## 2020-07-11 DIAGNOSIS — C20 Malignant neoplasm of rectum: Secondary | ICD-10-CM

## 2020-07-11 DIAGNOSIS — C801 Malignant (primary) neoplasm, unspecified: Secondary | ICD-10-CM | POA: Diagnosis not present

## 2020-07-11 DIAGNOSIS — Z23 Encounter for immunization: Secondary | ICD-10-CM | POA: Diagnosis not present

## 2020-07-11 DIAGNOSIS — Z9221 Personal history of antineoplastic chemotherapy: Secondary | ICD-10-CM | POA: Diagnosis not present

## 2020-07-11 DIAGNOSIS — C3431 Malignant neoplasm of lower lobe, right bronchus or lung: Secondary | ICD-10-CM | POA: Diagnosis not present

## 2020-07-11 DIAGNOSIS — E8809 Other disorders of plasma-protein metabolism, not elsewhere classified: Secondary | ICD-10-CM | POA: Diagnosis not present

## 2020-07-11 DIAGNOSIS — D72819 Decreased white blood cell count, unspecified: Secondary | ICD-10-CM | POA: Diagnosis not present

## 2020-07-11 DIAGNOSIS — Z85048 Personal history of other malignant neoplasm of rectum, rectosigmoid junction, and anus: Secondary | ICD-10-CM | POA: Diagnosis not present

## 2020-07-11 DIAGNOSIS — C7801 Secondary malignant neoplasm of right lung: Secondary | ICD-10-CM | POA: Diagnosis not present

## 2020-07-11 DIAGNOSIS — M25552 Pain in left hip: Secondary | ICD-10-CM | POA: Diagnosis not present

## 2020-07-11 DIAGNOSIS — D696 Thrombocytopenia, unspecified: Secondary | ICD-10-CM | POA: Diagnosis not present

## 2020-07-11 DIAGNOSIS — Z933 Colostomy status: Secondary | ICD-10-CM | POA: Diagnosis not present

## 2020-07-11 LAB — COMPREHENSIVE METABOLIC PANEL
ALT: 13 U/L (ref 0–44)
AST: 15 U/L (ref 15–41)
Albumin: 3.2 g/dL — ABNORMAL LOW (ref 3.5–5.0)
Alkaline Phosphatase: 54 U/L (ref 38–126)
Anion gap: 10 (ref 5–15)
BUN: 8 mg/dL (ref 6–20)
CO2: 25 mmol/L (ref 22–32)
Calcium: 8.3 mg/dL — ABNORMAL LOW (ref 8.9–10.3)
Chloride: 101 mmol/L (ref 98–111)
Creatinine, Ser: 1.03 mg/dL (ref 0.61–1.24)
GFR, Estimated: >60 mL/min (ref >60)
Glucose, Bld: 101 mg/dL — ABNORMAL HIGH (ref 70–99)
Potassium: 4 mmol/L (ref 3.5–5.1)
Sodium: 136 mmol/L (ref 135–145)
Total Bilirubin: 0.7 mg/dL (ref 0.3–1.2)
Total Protein: 6.9 g/dL (ref 6.5–8.1)

## 2020-07-11 LAB — CBC WITH DIFFERENTIAL/PLATELET
Abs Immature Granulocytes: 0.01 10*3/uL (ref 0.00–0.07)
Basophils Absolute: 0 10*3/uL (ref 0.0–0.1)
Basophils Relative: 0 %
Eosinophils Absolute: 0.1 10*3/uL (ref 0.0–0.5)
Eosinophils Relative: 3 %
HCT: 45.3 % (ref 39.0–52.0)
Hemoglobin: 15 g/dL (ref 13.0–17.0)
Immature Granulocytes: 0 %
Lymphocytes Relative: 19 %
Lymphs Abs: 0.5 10*3/uL — ABNORMAL LOW (ref 0.7–4.0)
MCH: 33.1 pg (ref 26.0–34.0)
MCHC: 33.1 g/dL (ref 30.0–36.0)
MCV: 100 fL (ref 80.0–100.0)
Monocytes Absolute: 0.3 10*3/uL (ref 0.1–1.0)
Monocytes Relative: 11 %
Neutro Abs: 1.7 10*3/uL (ref 1.7–7.7)
Neutrophils Relative %: 67 %
Platelets: 130 10*3/uL — ABNORMAL LOW (ref 150–400)
RBC: 4.53 MIL/uL (ref 4.22–5.81)
RDW: 13.4 % (ref 11.5–15.5)
WBC: 2.7 10*3/uL — ABNORMAL LOW (ref 4.0–10.5)
nRBC: 0 % (ref 0.0–0.2)

## 2020-07-11 MED ORDER — PALONOSETRON HCL INJECTION 0.25 MG/5ML
0.2500 mg | Freq: Once | INTRAVENOUS | Status: AC
  Administered 2020-07-11: 0.25 mg via INTRAVENOUS
  Filled 2020-07-11: qty 5

## 2020-07-11 MED ORDER — SODIUM CHLORIDE 0.9 % IV SOLN
45.0000 mg/m2 | Freq: Once | INTRAVENOUS | Status: AC
  Administered 2020-07-11: 84 mg via INTRAVENOUS
  Filled 2020-07-11: qty 14

## 2020-07-11 MED ORDER — SODIUM CHLORIDE 0.9% FLUSH
10.0000 mL | INTRAVENOUS | Status: DC | PRN
  Administered 2020-07-11 (×2): 10 mL

## 2020-07-11 MED ORDER — SODIUM CHLORIDE 0.9 % IV SOLN
160.9500 mg | Freq: Once | INTRAVENOUS | Status: AC
  Administered 2020-07-11: 160 mg via INTRAVENOUS
  Filled 2020-07-11: qty 16

## 2020-07-11 MED ORDER — FAMOTIDINE IN NACL 20-0.9 MG/50ML-% IV SOLN
20.0000 mg | Freq: Once | INTRAVENOUS | Status: AC
  Administered 2020-07-11: 20 mg via INTRAVENOUS
  Filled 2020-07-11: qty 50

## 2020-07-11 MED ORDER — HEPARIN SOD (PORK) LOCK FLUSH 100 UNIT/ML IV SOLN
500.0000 [IU] | Freq: Once | INTRAVENOUS | Status: AC | PRN
  Administered 2020-07-11: 500 [IU]

## 2020-07-11 MED ORDER — SODIUM CHLORIDE 0.9 % IV SOLN
20.0000 mg | Freq: Once | INTRAVENOUS | Status: AC
  Administered 2020-07-11: 20 mg via INTRAVENOUS
  Filled 2020-07-11: qty 20

## 2020-07-11 MED ORDER — DIPHENHYDRAMINE HCL 50 MG/ML IJ SOLN
50.0000 mg | Freq: Once | INTRAMUSCULAR | Status: AC
  Administered 2020-07-11: 50 mg via INTRAVENOUS
  Filled 2020-07-11: qty 1

## 2020-07-11 MED ORDER — SODIUM CHLORIDE 0.9 % IV SOLN: Freq: Once | INTRAVENOUS | Status: AC

## 2020-07-11 NOTE — Progress Notes (Signed)
Juan Wheeler presents today for [employer]. He reports he has been feeling more fatigued lately. He reports shortness of breath that worsens with a lot of activity as well as a cough and some tightness in his chest. He has started having neuropathy in his fingers and states his feet are very sensitive to touch at times. Lab results and vitals have been reviewed and are stable and within parameters for treatment. Patient has been assessed by Dr. Delton Coombes who has approved proceeding with treatment today as planned. Per MD, he will dose reduce today because of his ANC of 1.7.  Infusions tolerated without incident or complaint. VSS upon completion of treatment. Port flushed and deaccessed per protocol, see MAR and IV flowsheet for details. Discharged in satisfactory condition with follow up instructions.
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
## 2020-07-11 NOTE — Patient Instructions (Signed)
[facility] Discharge Instructions for Patients Receiving Chemotherapy   Beginning January 23rd 2017 lab work for the [facility] will be done in the  Main lab at [facility] on 1st floor. If you have a lab appointment with the [facility] please come in thru the  Main Entrance and check in at the main information desk   Today you received the following chemotherapy agents Carboplatin and Taxol  To help prevent nausea and vomiting after your treatment, we encourage you to take your nausea medication   If you develop nausea and vomiting, or diarrhea that is not controlled by your medication, call the clinic.  The clinic phone number is [phone]. Office hours are Monday-Friday 8:30am-5:00pm.  BELOW ARE SYMPTOMS THAT SHOULD BE REPORTED IMMEDIATELY:  *FEVER GREATER THAN 101.0 F  *CHILLS WITH OR WITHOUT FEVER  NAUSEA AND VOMITING THAT IS NOT CONTROLLED WITH YOUR NAUSEA MEDICATION  *UNUSUAL SHORTNESS OF BREATH  *UNUSUAL BRUISING OR BLEEDING  TENDERNESS IN MOUTH AND THROAT WITH OR WITHOUT PRESENCE OF ULCERS  *URINARY PROBLEMS  *BOWEL PROBLEMS  UNUSUAL RASH Items with * indicate a potential emergency and should be followed up as soon as possible. If you have an emergency after office hours please contact your primary care physician or go to the nearest emergency department.  Please call the clinic during office hours if you have any questions or concerns.   You may also contact the Patient Navigator at [phone] should you have any questions or need assistance in obtaining follow up care.      Resources For Cancer Patients and their Caregivers ? American Cancer Society: Can assist with transportation, wigs, general needs, runs Look Good Feel Better.        617-176-6466 ? Cancer Care: Provides financial assistance, online support groups, medication/co-pay assistance.  1-800-813-HOPE (720)098-1524) ? [place] Assists  [place] cancer patients and their families through emotional , educational and financial support.  865-603-2664 ? [place] DSS Where to apply for food stamps, Medicaid and utility assistance. 445-229-6687 ? [facility]: Transportation to medical appointments. (415)436-0399 ? Social Security Administration: May apply for disability if have a Stage IV cancer. 910-819-7605 617-201-1008 ? [employer], Disability and Transit Services: Assists with nutrition, care and transit needs. (628)306-9082

## 2020-07-11 NOTE — Patient Instructions (Signed)
[place] at [facility] Discharge Instructions  You were seen today by Dr. Delton Coombes. He went over your recent results. You received your treatment today. Start taking the hydrocodone every 6 hours for your pain. Dr. Delton Coombes will see you back in 1 week for labs and follow up.   Thank you for choosing [place] at [facility] to provide your oncology and hematology care.  To afford each patient quality time with our provider, please arrive at least 15 minutes before your scheduled appointment time.   If you have a lab appointment with the [facility] please come in thru the Main Entrance and check in at the main information desk  You need to re-schedule your appointment should you arrive 10 or more minutes late.  We strive to give you quality time with our providers, and arriving late affects you and other patients whose appointments are after yours.  Also, if you no show three or more times for appointments you may be dismissed from the clinic at the providers discretion.     Again, thank you for choosing [facility].  Our hope is that these requests will decrease the amount of time that you wait before being seen by our physicians.       _____________________________________________________________  Should you have questions after your visit to [facility], please contact our office at [phone] between the hours of 8:00 a.m. and 4:30 p.m.  Voicemails left after 4:00 p.m. will not be returned until the following business day.  For prescription refill requests, have your pharmacy contact our office and allow 72 hours.    Cancer Center Support Programs:   > Cancer Support Group  2nd Tuesday of the month 1pm-2pm, Journey Room

## 2020-07-11 NOTE — Progress Notes (Signed)
Patient was assessed by Dr. Delton Coombes and labs have been reviewed.  His white count has dropped slightly, Dr. Delton Coombes is dose reducing his chemotherapy.  Patient is okay to proceed with treatment today. Primary RN and pharmacy aware.

## 2020-07-12 DIAGNOSIS — C801 Malignant (primary) neoplasm, unspecified: Secondary | ICD-10-CM | POA: Diagnosis not present

## 2020-07-12 DIAGNOSIS — C3431 Malignant neoplasm of lower lobe, right bronchus or lung: Secondary | ICD-10-CM | POA: Diagnosis not present

## 2020-07-12 DIAGNOSIS — C7801 Secondary malignant neoplasm of right lung: Secondary | ICD-10-CM | POA: Diagnosis not present

## 2020-07-15 DIAGNOSIS — C3431 Malignant neoplasm of lower lobe, right bronchus or lung: Secondary | ICD-10-CM | POA: Diagnosis not present

## 2020-07-15 DIAGNOSIS — C801 Malignant (primary) neoplasm, unspecified: Secondary | ICD-10-CM | POA: Diagnosis not present

## 2020-07-15 DIAGNOSIS — C7801 Secondary malignant neoplasm of right lung: Secondary | ICD-10-CM | POA: Diagnosis not present

## 2020-07-16 DIAGNOSIS — C801 Malignant (primary) neoplasm, unspecified: Secondary | ICD-10-CM | POA: Diagnosis not present

## 2020-07-16 DIAGNOSIS — C7801 Secondary malignant neoplasm of right lung: Secondary | ICD-10-CM | POA: Diagnosis not present

## 2020-07-16 DIAGNOSIS — C3431 Malignant neoplasm of lower lobe, right bronchus or lung: Secondary | ICD-10-CM | POA: Diagnosis not present

## 2020-07-17 DIAGNOSIS — S31829A Unspecified open wound of left buttock, initial encounter: Secondary | ICD-10-CM | POA: Diagnosis not present

## 2020-07-17 DIAGNOSIS — C3431 Malignant neoplasm of lower lobe, right bronchus or lung: Secondary | ICD-10-CM | POA: Diagnosis not present

## 2020-07-17 DIAGNOSIS — C7801 Secondary malignant neoplasm of right lung: Secondary | ICD-10-CM | POA: Diagnosis not present

## 2020-07-17 DIAGNOSIS — Z4801 Encounter for change or removal of surgical wound dressing: Secondary | ICD-10-CM | POA: Diagnosis not present

## 2020-07-17 DIAGNOSIS — C801 Malignant (primary) neoplasm, unspecified: Secondary | ICD-10-CM | POA: Diagnosis not present

## 2020-07-17 DIAGNOSIS — C20 Malignant neoplasm of rectum: Secondary | ICD-10-CM | POA: Diagnosis not present

## 2020-07-17 DIAGNOSIS — Z933 Colostomy status: Secondary | ICD-10-CM | POA: Diagnosis not present

## 2020-07-18 ENCOUNTER — Inpatient Hospital Stay (HOSPITAL_COMMUNITY): Payer: Medicare Other

## 2020-07-18 ENCOUNTER — Inpatient Hospital Stay (HOSPITAL_BASED_OUTPATIENT_CLINIC_OR_DEPARTMENT_OTHER): Payer: Medicare Other | Admitting: Hematology

## 2020-07-18 ENCOUNTER — Other Ambulatory Visit: Payer: Self-pay

## 2020-07-18 VITALS — BP 105/70 | HR 86 | Temp 97.2°F | Resp 18

## 2020-07-18 VITALS — BP 97/61 | HR 94 | Temp 97.0°F | Resp 18 | Wt 166.8 lb

## 2020-07-18 DIAGNOSIS — Z5111 Encounter for antineoplastic chemotherapy: Secondary | ICD-10-CM | POA: Diagnosis not present

## 2020-07-18 DIAGNOSIS — C7801 Secondary malignant neoplasm of right lung: Secondary | ICD-10-CM | POA: Diagnosis not present

## 2020-07-18 DIAGNOSIS — C801 Malignant (primary) neoplasm, unspecified: Secondary | ICD-10-CM | POA: Diagnosis not present

## 2020-07-18 DIAGNOSIS — C3431 Malignant neoplasm of lower lobe, right bronchus or lung: Secondary | ICD-10-CM | POA: Diagnosis not present

## 2020-07-18 DIAGNOSIS — Z23 Encounter for immunization: Secondary | ICD-10-CM | POA: Diagnosis not present

## 2020-07-18 DIAGNOSIS — G629 Polyneuropathy, unspecified: Secondary | ICD-10-CM | POA: Diagnosis not present

## 2020-07-18 DIAGNOSIS — C3491 Malignant neoplasm of unspecified part of right bronchus or lung: Secondary | ICD-10-CM

## 2020-07-18 DIAGNOSIS — D696 Thrombocytopenia, unspecified: Secondary | ICD-10-CM | POA: Diagnosis not present

## 2020-07-18 DIAGNOSIS — E8809 Other disorders of plasma-protein metabolism, not elsewhere classified: Secondary | ICD-10-CM | POA: Diagnosis not present

## 2020-07-18 DIAGNOSIS — Z85048 Personal history of other malignant neoplasm of rectum, rectosigmoid junction, and anus: Secondary | ICD-10-CM | POA: Diagnosis not present

## 2020-07-18 DIAGNOSIS — D72819 Decreased white blood cell count, unspecified: Secondary | ICD-10-CM | POA: Diagnosis not present

## 2020-07-18 DIAGNOSIS — N189 Chronic kidney disease, unspecified: Secondary | ICD-10-CM | POA: Diagnosis not present

## 2020-07-18 DIAGNOSIS — F17210 Nicotine dependence, cigarettes, uncomplicated: Secondary | ICD-10-CM | POA: Diagnosis not present

## 2020-07-18 DIAGNOSIS — M25552 Pain in left hip: Secondary | ICD-10-CM | POA: Diagnosis not present

## 2020-07-18 DIAGNOSIS — Z9221 Personal history of antineoplastic chemotherapy: Secondary | ICD-10-CM | POA: Diagnosis not present

## 2020-07-18 DIAGNOSIS — C20 Malignant neoplasm of rectum: Secondary | ICD-10-CM | POA: Diagnosis not present

## 2020-07-18 DIAGNOSIS — R1310 Dysphagia, unspecified: Secondary | ICD-10-CM | POA: Diagnosis not present

## 2020-07-18 DIAGNOSIS — Z933 Colostomy status: Secondary | ICD-10-CM | POA: Diagnosis not present

## 2020-07-18 DIAGNOSIS — Z923 Personal history of irradiation: Secondary | ICD-10-CM | POA: Diagnosis not present

## 2020-07-18 DIAGNOSIS — Z95828 Presence of other vascular implants and grafts: Secondary | ICD-10-CM

## 2020-07-18 LAB — CBC WITH DIFFERENTIAL/PLATELET
Abs Immature Granulocytes: 0.01 10*3/uL (ref 0.00–0.07)
Basophils Absolute: 0 10*3/uL (ref 0.0–0.1)
Basophils Relative: 1 %
Eosinophils Absolute: 0 10*3/uL (ref 0.0–0.5)
Eosinophils Relative: 1 %
HCT: 45.5 % (ref 39.0–52.0)
Hemoglobin: 15.4 g/dL (ref 13.0–17.0)
Immature Granulocytes: 0 %
Lymphocytes Relative: 15 %
Lymphs Abs: 0.4 10*3/uL — ABNORMAL LOW (ref 0.7–4.0)
MCH: 33.6 pg (ref 26.0–34.0)
MCHC: 33.8 g/dL (ref 30.0–36.0)
MCV: 99.1 fL (ref 80.0–100.0)
Monocytes Absolute: 0.3 10*3/uL (ref 0.1–1.0)
Monocytes Relative: 11 %
Neutro Abs: 2 10*3/uL (ref 1.7–7.7)
Neutrophils Relative %: 72 %
Platelets: 137 10*3/uL — ABNORMAL LOW (ref 150–400)
RBC: 4.59 MIL/uL (ref 4.22–5.81)
RDW: 13.4 % (ref 11.5–15.5)
WBC: 2.9 10*3/uL — ABNORMAL LOW (ref 4.0–10.5)
nRBC: 0 % (ref 0.0–0.2)

## 2020-07-18 LAB — COMPREHENSIVE METABOLIC PANEL
ALT: 12 U/L (ref 0–44)
AST: 14 U/L — ABNORMAL LOW (ref 15–41)
Albumin: 3.3 g/dL — ABNORMAL LOW (ref 3.5–5.0)
Alkaline Phosphatase: 56 U/L (ref 38–126)
Anion gap: 10 (ref 5–15)
BUN: 10 mg/dL (ref 6–20)
CO2: 25 mmol/L (ref 22–32)
Calcium: 8.9 mg/dL (ref 8.9–10.3)
Chloride: 100 mmol/L (ref 98–111)
Creatinine, Ser: 1.15 mg/dL (ref 0.61–1.24)
GFR, Estimated: >60 mL/min (ref >60)
Glucose, Bld: 153 mg/dL — ABNORMAL HIGH (ref 70–99)
Potassium: 4 mmol/L (ref 3.5–5.1)
Sodium: 135 mmol/L (ref 135–145)
Total Bilirubin: 0.8 mg/dL (ref 0.3–1.2)
Total Protein: 7.1 g/dL (ref 6.5–8.1)

## 2020-07-18 MED ORDER — SODIUM CHLORIDE 0.9 % IV SOLN
45.0000 mg/m2 | Freq: Once | INTRAVENOUS | Status: AC
  Administered 2020-07-18: 84 mg via INTRAVENOUS
  Filled 2020-07-18: qty 14

## 2020-07-18 MED ORDER — SODIUM CHLORIDE 0.9 % IV SOLN
20.0000 mg | Freq: Once | INTRAVENOUS | Status: AC
  Administered 2020-07-18: 20 mg via INTRAVENOUS
  Filled 2020-07-18: qty 20

## 2020-07-18 MED ORDER — HEPARIN SOD (PORK) LOCK FLUSH 100 UNIT/ML IV SOLN
500.0000 [IU] | Freq: Once | INTRAVENOUS | Status: AC | PRN
  Administered 2020-07-18: 500 [IU]

## 2020-07-18 MED ORDER — SODIUM CHLORIDE 0.9 % IV SOLN: Freq: Once | INTRAVENOUS | Status: AC

## 2020-07-18 MED ORDER — FAMOTIDINE IN NACL 20-0.9 MG/50ML-% IV SOLN
20.0000 mg | Freq: Once | INTRAVENOUS | Status: AC
  Administered 2020-07-18: 20 mg via INTRAVENOUS
  Filled 2020-07-18: qty 50

## 2020-07-18 MED ORDER — DIPHENHYDRAMINE HCL 50 MG/ML IJ SOLN
50.0000 mg | Freq: Once | INTRAMUSCULAR | Status: AC
  Administered 2020-07-18: 50 mg via INTRAVENOUS
  Filled 2020-07-18: qty 1

## 2020-07-18 MED ORDER — SODIUM CHLORIDE 0.9 % IV SOLN
197.4000 mg | Freq: Once | INTRAVENOUS | Status: AC
  Administered 2020-07-18: 200 mg via INTRAVENOUS
  Filled 2020-07-18: qty 20

## 2020-07-18 MED ORDER — PALONOSETRON HCL INJECTION 0.25 MG/5ML
0.2500 mg | Freq: Once | INTRAVENOUS | Status: AC
  Administered 2020-07-18: 0.25 mg via INTRAVENOUS
  Filled 2020-07-18: qty 5

## 2020-07-18 MED ORDER — SODIUM CHLORIDE 0.9% FLUSH
10.0000 mL | INTRAVENOUS | Status: DC | PRN
  Administered 2020-07-18: 10 mL
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
## 2020-07-18 NOTE — Patient Instructions (Signed)
[place] at [facility] Discharge Instructions  You were seen today by Dr. Delton Coombes. He went over your recent results. You received your treatment today. Dr. Delton Coombes will see you back in 1 week for labs and follow up.   Thank you for choosing [place] at [facility] to provide your oncology and hematology care.  To afford each patient quality time with our provider, please arrive at least 15 minutes before your scheduled appointment time.   If you have a lab appointment with the [place] please come in thru the Main Entrance and check in at the main information desk  You need to re-schedule your appointment should you arrive 10 or more minutes late.  We strive to give you quality time with our providers, and arriving late affects you and other patients whose appointments are after yours.  Also, if you no show three or more times for appointments you may be dismissed from the clinic at the providers discretion.     Again, thank you for choosing [facility].  Our hope is that these requests will decrease the amount of time that you wait before being seen by our physicians.       _____________________________________________________________  Should you have questions after your visit to [facility], please contact our office at [phone] between the hours of 8:00 a.m. and 4:30 p.m.  Voicemails left after 4:00 p.m. will not be returned until the following business day.  For prescription refill requests, have your pharmacy contact our office and allow 72 hours.    Cancer Center Support Programs:   > Cancer Support Group  2nd Tuesday of the month 1pm-2pm, Journey Room

## 2020-07-18 NOTE — Progress Notes (Signed)
Confirmed today's dose of carboplatin to be AUC 2  T.O. Dr Rhys Martini, PharmD

## 2020-07-18 NOTE — Progress Notes (Signed)
Message received from [place] RN/ Dr. Delton Coombes proceed with treatment. Labs within parameters for treatment. MAR reviewed. Vital signs within parameters for treatment.   Patient denies any changes since his last visit. Patient has complaints of a cough, unchanged that is ongoing. No change in the severity or color of mucous per patient's words.    Treatment given today per MD orders. Tolerated infusion without adverse affects. Vital signs stable. No complaints at this time. Discharged from clinic ambulatory in stable condition. Alert and oriented x 3. F/U with [facility] as scheduled.

## 2020-07-18 NOTE — Progress Notes (Signed)
Patient was assessed by Dr. Katragadda and labs have been reviewed.  Patient is okay to proceed with treatment today. Primary RN and pharmacy aware.   

## 2020-07-18 NOTE — Patient Instructions (Signed)
[facility] at [facility] Discharge Instructions  Labs drawn from portacath today   Thank you for choosing [facility] at [facility] to provide your oncology and hematology care.  To afford each patient quality time with our provider, please arrive at least 15 minutes before your scheduled appointment time.   If you have a lab appointment with the Cancer Center please come in thru the Main Entrance and check in at the main information desk.  You need to re-schedule your appointment should you arrive 10 or more minutes late.  We strive to give you quality time with our providers, and arriving late affects you and other patients whose appointments are after yours.  Also, if you no show three or more times for appointments you may be dismissed from the clinic at the providers discretion.     Again, thank you for choosing [facility].  Our hope is that these requests will decrease the amount of time that you wait before being seen by our physicians.       _____________________________________________________________  Should you have questions after your visit to [facility], please contact our office at [phone] and follow the prompts.  Our office hours are 8:00 a.m. and 4:30 p.m. Monday - Friday.  Please note that voicemails left after 4:00 p.m. may not be returned until the following business day.  We are closed weekends and major holidays.  You do have access to a nurse 24-7, just call the main number to the clinic [phone] and do not press any options, hold on the line and a nurse will answer the phone.    For prescription refill requests, have your pharmacy contact our office and allow 72 hours.    Due to Covid, you will need to wear a mask upon entering the hospital. If you do not have a mask, a mask will be given to you at the Main Entrance upon arrival. For doctor visits, patients may have 1 support person age 18  or older with them. For treatment visits, patients can not have anyone with them due to social distancing guidelines and our immunocompromised population.     

## 2020-07-18 NOTE — Patient Instructions (Signed)
[facility] Discharge Instructions for Patients Receiving Chemotherapy  Today you received the following chemotherapy agents   To help prevent nausea and vomiting after your treatment, we encourage you to take your nausea medication   If you develop nausea and vomiting that is not controlled by your nausea medication, call the clinic.   BELOW ARE SYMPTOMS THAT SHOULD BE REPORTED IMMEDIATELY:  *FEVER GREATER THAN 100.5 F  *CHILLS WITH OR WITHOUT FEVER  NAUSEA AND VOMITING THAT IS NOT CONTROLLED WITH YOUR NAUSEA MEDICATION  *UNUSUAL SHORTNESS OF BREATH  *UNUSUAL BRUISING OR BLEEDING  TENDERNESS IN MOUTH AND THROAT WITH OR WITHOUT PRESENCE OF ULCERS  *URINARY PROBLEMS  *BOWEL PROBLEMS  UNUSUAL RASH Items with * indicate a potential emergency and should be followed up as soon as possible.  Feel free to call the clinic should you have any questions or concerns. The clinic phone number is [phone].  Please show the CHEMO ALERT CARD at check-in to the Emergency Department and triage nurse.   

## 2020-07-19 DIAGNOSIS — C7801 Secondary malignant neoplasm of right lung: Secondary | ICD-10-CM | POA: Diagnosis not present

## 2020-07-19 DIAGNOSIS — C801 Malignant (primary) neoplasm, unspecified: Secondary | ICD-10-CM | POA: Diagnosis not present

## 2020-07-19 DIAGNOSIS — C3431 Malignant neoplasm of lower lobe, right bronchus or lung: Secondary | ICD-10-CM | POA: Diagnosis not present

## 2020-07-22 DIAGNOSIS — C20 Malignant neoplasm of rectum: Secondary | ICD-10-CM | POA: Diagnosis not present

## 2020-07-22 DIAGNOSIS — Z4801 Encounter for change or removal of surgical wound dressing: Secondary | ICD-10-CM | POA: Diagnosis not present

## 2020-07-22 DIAGNOSIS — C7801 Secondary malignant neoplasm of right lung: Secondary | ICD-10-CM | POA: Diagnosis not present

## 2020-07-22 DIAGNOSIS — Z933 Colostomy status: Secondary | ICD-10-CM | POA: Diagnosis not present

## 2020-07-22 DIAGNOSIS — C3431 Malignant neoplasm of lower lobe, right bronchus or lung: Secondary | ICD-10-CM | POA: Diagnosis not present

## 2020-07-22 DIAGNOSIS — S31829A Unspecified open wound of left buttock, initial encounter: Secondary | ICD-10-CM | POA: Diagnosis not present

## 2020-07-22 DIAGNOSIS — C801 Malignant (primary) neoplasm, unspecified: Secondary | ICD-10-CM | POA: Diagnosis not present

## 2020-07-23 DIAGNOSIS — C7801 Secondary malignant neoplasm of right lung: Secondary | ICD-10-CM | POA: Diagnosis not present

## 2020-07-23 DIAGNOSIS — C801 Malignant (primary) neoplasm, unspecified: Secondary | ICD-10-CM | POA: Diagnosis not present

## 2020-07-23 DIAGNOSIS — C3431 Malignant neoplasm of lower lobe, right bronchus or lung: Secondary | ICD-10-CM | POA: Diagnosis not present

## 2020-07-24 DIAGNOSIS — C7801 Secondary malignant neoplasm of right lung: Secondary | ICD-10-CM | POA: Diagnosis not present

## 2020-07-24 DIAGNOSIS — C801 Malignant (primary) neoplasm, unspecified: Secondary | ICD-10-CM | POA: Diagnosis not present

## 2020-07-24 DIAGNOSIS — C3431 Malignant neoplasm of lower lobe, right bronchus or lung: Secondary | ICD-10-CM | POA: Diagnosis not present

## 2020-07-25 ENCOUNTER — Other Ambulatory Visit (HOSPITAL_COMMUNITY): Payer: Self-pay | Admitting: *Deleted

## 2020-07-25 ENCOUNTER — Inpatient Hospital Stay (HOSPITAL_BASED_OUTPATIENT_CLINIC_OR_DEPARTMENT_OTHER): Payer: Medicare Other | Admitting: Hematology

## 2020-07-25 ENCOUNTER — Other Ambulatory Visit: Payer: Self-pay

## 2020-07-25 ENCOUNTER — Inpatient Hospital Stay (HOSPITAL_COMMUNITY): Payer: Medicare Other

## 2020-07-25 VITALS — BP 105/81 | HR 78 | Temp 96.9°F | Resp 18 | Wt 164.4 lb

## 2020-07-25 DIAGNOSIS — C20 Malignant neoplasm of rectum: Secondary | ICD-10-CM

## 2020-07-25 DIAGNOSIS — C3431 Malignant neoplasm of lower lobe, right bronchus or lung: Secondary | ICD-10-CM | POA: Diagnosis not present

## 2020-07-25 DIAGNOSIS — C7801 Secondary malignant neoplasm of right lung: Secondary | ICD-10-CM | POA: Diagnosis not present

## 2020-07-25 DIAGNOSIS — C3491 Malignant neoplasm of unspecified part of right bronchus or lung: Secondary | ICD-10-CM | POA: Diagnosis not present

## 2020-07-25 DIAGNOSIS — Z933 Colostomy status: Secondary | ICD-10-CM | POA: Diagnosis not present

## 2020-07-25 DIAGNOSIS — M25552 Pain in left hip: Secondary | ICD-10-CM | POA: Diagnosis not present

## 2020-07-25 DIAGNOSIS — Z23 Encounter for immunization: Secondary | ICD-10-CM | POA: Diagnosis not present

## 2020-07-25 DIAGNOSIS — Z923 Personal history of irradiation: Secondary | ICD-10-CM | POA: Diagnosis not present

## 2020-07-25 DIAGNOSIS — N189 Chronic kidney disease, unspecified: Secondary | ICD-10-CM | POA: Diagnosis not present

## 2020-07-25 DIAGNOSIS — Z9221 Personal history of antineoplastic chemotherapy: Secondary | ICD-10-CM | POA: Diagnosis not present

## 2020-07-25 DIAGNOSIS — Z5111 Encounter for antineoplastic chemotherapy: Secondary | ICD-10-CM | POA: Diagnosis not present

## 2020-07-25 DIAGNOSIS — R1310 Dysphagia, unspecified: Secondary | ICD-10-CM | POA: Diagnosis not present

## 2020-07-25 DIAGNOSIS — C801 Malignant (primary) neoplasm, unspecified: Secondary | ICD-10-CM | POA: Diagnosis not present

## 2020-07-25 DIAGNOSIS — D702 Other drug-induced agranulocytosis: Secondary | ICD-10-CM

## 2020-07-25 DIAGNOSIS — Z85048 Personal history of other malignant neoplasm of rectum, rectosigmoid junction, and anus: Secondary | ICD-10-CM | POA: Diagnosis not present

## 2020-07-25 DIAGNOSIS — F17210 Nicotine dependence, cigarettes, uncomplicated: Secondary | ICD-10-CM | POA: Diagnosis not present

## 2020-07-25 DIAGNOSIS — D696 Thrombocytopenia, unspecified: Secondary | ICD-10-CM | POA: Diagnosis not present

## 2020-07-25 DIAGNOSIS — E8809 Other disorders of plasma-protein metabolism, not elsewhere classified: Secondary | ICD-10-CM | POA: Diagnosis not present

## 2020-07-25 DIAGNOSIS — G629 Polyneuropathy, unspecified: Secondary | ICD-10-CM | POA: Diagnosis not present

## 2020-07-25 DIAGNOSIS — D72819 Decreased white blood cell count, unspecified: Secondary | ICD-10-CM | POA: Diagnosis not present

## 2020-07-25 LAB — COMPREHENSIVE METABOLIC PANEL
ALT: 15 U/L (ref 0–44)
AST: 16 U/L (ref 15–41)
Albumin: 3.4 g/dL — ABNORMAL LOW (ref 3.5–5.0)
Alkaline Phosphatase: 58 U/L (ref 38–126)
Anion gap: 8 (ref 5–15)
BUN: 11 mg/dL (ref 6–20)
CO2: 22 mmol/L (ref 22–32)
Calcium: 8.9 mg/dL (ref 8.9–10.3)
Chloride: 105 mmol/L (ref 98–111)
Creatinine, Ser: 1.23 mg/dL (ref 0.61–1.24)
GFR, Estimated: >60 mL/min (ref >60)
Glucose, Bld: 111 mg/dL — ABNORMAL HIGH (ref 70–99)
Potassium: 4.6 mmol/L (ref 3.5–5.1)
Sodium: 135 mmol/L (ref 135–145)
Total Bilirubin: 0.9 mg/dL (ref 0.3–1.2)
Total Protein: 7.3 g/dL (ref 6.5–8.1)

## 2020-07-25 LAB — CBC WITH DIFFERENTIAL/PLATELET
Abs Immature Granulocytes: 0.01 10*3/uL (ref 0.00–0.07)
Basophils Absolute: 0 10*3/uL (ref 0.0–0.1)
Basophils Relative: 2 %
Eosinophils Absolute: 0 10*3/uL (ref 0.0–0.5)
Eosinophils Relative: 1 %
HCT: 47.5 % (ref 39.0–52.0)
Hemoglobin: 16 g/dL (ref 13.0–17.0)
Immature Granulocytes: 1 %
Lymphocytes Relative: 18 %
Lymphs Abs: 0.3 10*3/uL — ABNORMAL LOW (ref 0.7–4.0)
MCH: 33.7 pg (ref 26.0–34.0)
MCHC: 33.7 g/dL (ref 30.0–36.0)
MCV: 100 fL (ref 80.0–100.0)
Monocytes Absolute: 0.3 10*3/uL (ref 0.1–1.0)
Monocytes Relative: 13 %
Neutro Abs: 1.2 10*3/uL — ABNORMAL LOW (ref 1.7–7.7)
Neutrophils Relative %: 65 %
Platelets: 114 10*3/uL — ABNORMAL LOW (ref 150–400)
RBC: 4.75 MIL/uL (ref 4.22–5.81)
RDW: 13.5 % (ref 11.5–15.5)
WBC: 1.9 10*3/uL — ABNORMAL LOW (ref 4.0–10.5)
nRBC: 0 % (ref 0.0–0.2)

## 2020-07-25 MED ORDER — ALUM & MAG HYDROXIDE-SIMETH 200-200-20 MG/5 ML NICU TOPICAL: TOPICAL | 2 refills | Status: DC

## 2020-07-25 MED ORDER — SODIUM CHLORIDE 0.9 % IV SOLN: INTRAVENOUS | Status: AC

## 2020-07-25 MED ORDER — FILGRASTIM-SNDZ 300 MCG/0.5ML IJ SOSY
300.0000 ug | PREFILLED_SYRINGE | Freq: Once | INTRAMUSCULAR | Status: AC
  Administered 2020-07-25: 300 ug via SUBCUTANEOUS
  Filled 2020-07-25: qty 0.5
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
## 2020-07-25 NOTE — Patient Instructions (Addendum)
[place] at [facility] Discharge Instructions  You were seen today by Dr. Delton Coombes. He went over your recent results. You received your treatment today. You will be prescribed Magic Mouthwash; take 1 tablespoon by mouth 5 minutes before eating or drinking. If 1 tablespoon does not help, take 2 tablespoons. Purchase Boost/Ensure Plus and drink 3-4 cans daily to maintain your weight. Dr. Delton Coombes will see you back in 1 week for labs and follow up.   Thank you for choosing [place] at [facility] to provide your oncology and hematology care.  To afford each patient quality time with our provider, please arrive at least 15 minutes before your scheduled appointment time.   If you have a lab appointment with the [facility] please come in thru the Main Entrance and check in at the main information desk  You need to re-schedule your appointment should you arrive 10 or more minutes late.  We strive to give you quality time with our providers, and arriving late affects you and other patients whose appointments are after yours.  Also, if you no show three or more times for appointments you may be dismissed from the clinic at the providers discretion.     Again, thank you for choosing [facility].  Our hope is that these requests will decrease the amount of time that you wait before being seen by our physicians.       _____________________________________________________________  Should you have questions after your visit to [facility], please contact our office at [phone] between the hours of 8:00 a.m. and 4:30 p.m.  Voicemails left after 4:00 p.m. will not be returned until the following business day.  For prescription refill requests, have your pharmacy contact our office and allow 72 hours.    Cancer Center Support Programs:   > Cancer Support Group  2nd Tuesday of the month 1pm-2pm, Journey Room

## 2020-07-25 NOTE — Patient Instructions (Signed)
[place] at [facility]  Discharge Instructions:  Filgrastim, G-CSF injection What is this medicine? FILGRASTIM, G-CSF (fil GRA stim) is a granulocyte colony-stimulating factor that stimulates the growth of neutrophils, a type of white blood cell (WBC) important in the body's fight against infection. It is used to reduce the incidence of fever and infection in patients with certain types of cancer who are receiving chemotherapy that affects the bone marrow, to stimulate blood cell production for removal of WBCs from the body prior to a bone marrow transplantation, to reduce the incidence of fever and infection in patients who have severe chronic neutropenia, and to improve survival outcomes following high-dose radiation exposure that is toxic to the bone marrow. This medicine may be used for other purposes; ask your health care provider or pharmacist if you have questions. COMMON BRAND NAME(S): Neupogen, Nivestym, Zarxio What should I tell my health care provider before I take this medicine? They need to know if you have any of these conditions:  kidney disease  latex allergy  ongoing radiation therapy  sickle cell disease  an unusual or allergic reaction to filgrastim, pegfilgrastim, other medicines, foods, dyes, or preservatives  pregnant or trying to get pregnant  breast-feeding How should I use this medicine? This medicine is for injection under the skin or infusion into a vein. As an infusion into a vein, it is usually given by a health care professional in a hospital or clinic setting. If you get this medicine at home, you will be taught how to prepare and give this medicine. Refer to the Instructions for Use that come with your medication packaging. Use exactly as directed. Take your medicine at regular intervals. Do not take your medicine more often than directed. It is important that you put your used needles and syringes in a special sharps container. Do  not put them in a trash can. If you do not have a sharps container, call your pharmacist or healthcare provider to get one. Talk to your pediatrician regarding the use of this medicine in children. While this drug may be prescribed for children as young as 7 months for selected conditions, precautions do apply. Overdosage: If you think you have taken too much of this medicine contact a poison control center or emergency room at once. NOTE: This medicine is only for you. Do not share this medicine with others. What if I miss a dose? It is important not to miss your dose. Call your doctor or health care professional if you miss a dose. What may interact with this medicine? This medicine may interact with the following medications:  medicines that may cause a release of neutrophils, such as lithium This list may not describe all possible interactions. Give your health care provider a list of all the medicines, herbs, non-prescription drugs, or dietary supplements you use. Also tell them if you smoke, drink alcohol, or use illegal drugs. Some items may interact with your medicine. What should I watch for while using this medicine? You may need blood work done while you are taking this medicine. What side effects may I notice from receiving this medicine? Side effects that you should report to your doctor or health care professional as soon as possible:  allergic reactions like skin rash, itching or hives, swelling of the face, lips, or tongue  back pain  dizziness or feeling faint  fever  pain, redness, or irritation at site where injected  pinpoint red spots on the skin  shortness  of breath or breathing problems  signs and symptoms of kidney injury like trouble passing urine, change in the amount of urine, or red or dark-brown urine  stomach or side pain, or pain at the shoulder  swelling  tiredness  unusual bleeding or bruising Side effects that usually do not require medical  attention (report to your doctor or health care professional if they continue or are bothersome):  bone pain  cough  diarrhea  hair loss  headache  muscle pain This list may not describe all possible side effects. Call your doctor for medical advice about side effects. You may report side effects to FDA at 1-800-FDA-1088. Where should I keep my medicine? Keep out of the reach of children. Store in a refrigerator between 2 and 8 degrees C (36 and 46 degrees F). Do not freeze. Keep in carton to protect from light. Throw away this medicine if vials or syringes are left out of the refrigerator for more than 24 hours. Throw away any unused medicine after the expiration date. NOTE: This sheet is a summary. It may not cover all possible information. If you have questions about this medicine, talk to your doctor, pharmacist, or health care provider.  2020 Elsevier/Gold Standard (2018-07-14 16:55:01)  _______________________________________________________________  Thank you for choosing [place] at [facility] to provide your oncology and hematology care.  To afford each patient quality time with our providers, please arrive at least 15 minutes before your scheduled appointment.  You need to re-schedule your appointment if you arrive 10 or more minutes late.  We strive to give you quality time with our providers, and arriving late affects you and other patients whose appointments are after yours.  Also, if you no show three or more times for appointments you may be dismissed from the clinic.  Again, thank you for choosing [place] at [place] hope is that these requests will allow you access to exceptional care and in a timely manner. _______________________________________________________________  If you have questions after your visit, please contact our office at [phone] between the hours of 8:30 a.m. and 5:00 p.m. Voicemails left  after 4:30 p.m. will not be returned until the following business day. _______________________________________________________________  For prescription refill requests, have your pharmacy contact our office. _______________________________________________________________  Recommendations made by the consultant and any test results will be sent to your referring physician. _______________________________________________________________

## 2020-07-25 NOTE — Progress Notes (Signed)
Patient has radiation induced esophagitis.  Orders received for maalox/lidocaine.  Patient instructed to increase protein and calories to maintain his weight.  Creatinine is slightly elevated from his normal.  Orders received for normal saline 1000 ml over 2 hours.  At this time we are waiting on remaining labs to result.    Labs are back, WBC 1.9, ANC 1.2.  Orders received for GCSF 300 mg times once today and come back tomorrow for treatment.   Primary RN and pharmacy aware.

## 2020-07-25 NOTE — Patient Instructions (Signed)
[facility] at [facility] Discharge Instructions  Labs drawn from portacath today   Thank you for choosing [facility] at [facility] to provide your oncology and hematology care.  To afford each patient quality time with our provider, please arrive at least 15 minutes before your scheduled appointment time.   If you have a lab appointment with the Cancer Center please come in thru the Main Entrance and check in at the main information desk.  You need to re-schedule your appointment should you arrive 10 or more minutes late.  We strive to give you quality time with our providers, and arriving late affects you and other patients whose appointments are after yours.  Also, if you no show three or more times for appointments you may be dismissed from the clinic at the providers discretion.     Again, thank you for choosing [facility].  Our hope is that these requests will decrease the amount of time that you wait before being seen by our physicians.       _____________________________________________________________  Should you have questions after your visit to [facility], please contact our office at [phone] and follow the prompts.  Our office hours are 8:00 a.m. and 4:30 p.m. Monday - Friday.  Please note that voicemails left after 4:00 p.m. may not be returned until the following business day.  We are closed weekends and major holidays.  You do have access to a nurse 24-7, just call the main number to the clinic [phone] and do not press any options, hold on the line and a nurse will answer the phone.    For prescription refill requests, have your pharmacy contact our office and allow 72 hours.    Due to Covid, you will need to wear a mask upon entering the hospital. If you do not have a mask, a mask will be given to you at the Main Entrance upon arrival. For doctor visits, patients may have 1 support person age 18  or older with them. For treatment visits, patients can not have anyone with them due to social distancing guidelines and our immunocompromised population.     

## 2020-07-25 NOTE — Progress Notes (Signed)
Juan Wheeler presents today for [employer]. Tx to be held today due to [place] 1.2 per MD. Lesleigh Noe to be given per MD orders as well as 1L NS over 2 hours. Pt to return tomorrow for possible treatment.  Hydration tolerated without incident or complaint. VSS upon completion. Port flushed and deaccessed per protocol, see MAR and IV flowsheet for details. Discharged in satisfactory condition with follow up instructions.

## 2020-07-26 ENCOUNTER — Inpatient Hospital Stay (HOSPITAL_COMMUNITY): Payer: Medicare Other

## 2020-07-26 VITALS — BP 132/83 | HR 80 | Temp 97.7°F | Resp 18

## 2020-07-26 DIAGNOSIS — C801 Malignant (primary) neoplasm, unspecified: Secondary | ICD-10-CM | POA: Diagnosis not present

## 2020-07-26 DIAGNOSIS — Z933 Colostomy status: Secondary | ICD-10-CM | POA: Diagnosis not present

## 2020-07-26 DIAGNOSIS — R1310 Dysphagia, unspecified: Secondary | ICD-10-CM | POA: Diagnosis not present

## 2020-07-26 DIAGNOSIS — D72819 Decreased white blood cell count, unspecified: Secondary | ICD-10-CM | POA: Diagnosis not present

## 2020-07-26 DIAGNOSIS — C3431 Malignant neoplasm of lower lobe, right bronchus or lung: Secondary | ICD-10-CM | POA: Diagnosis not present

## 2020-07-26 DIAGNOSIS — C20 Malignant neoplasm of rectum: Secondary | ICD-10-CM | POA: Diagnosis not present

## 2020-07-26 DIAGNOSIS — G629 Polyneuropathy, unspecified: Secondary | ICD-10-CM | POA: Diagnosis not present

## 2020-07-26 DIAGNOSIS — Z95828 Presence of other vascular implants and grafts: Secondary | ICD-10-CM

## 2020-07-26 DIAGNOSIS — C7801 Secondary malignant neoplasm of right lung: Secondary | ICD-10-CM | POA: Diagnosis not present

## 2020-07-26 DIAGNOSIS — D696 Thrombocytopenia, unspecified: Secondary | ICD-10-CM | POA: Diagnosis not present

## 2020-07-26 DIAGNOSIS — M25552 Pain in left hip: Secondary | ICD-10-CM | POA: Diagnosis not present

## 2020-07-26 DIAGNOSIS — C3491 Malignant neoplasm of unspecified part of right bronchus or lung: Secondary | ICD-10-CM

## 2020-07-26 DIAGNOSIS — Z9221 Personal history of antineoplastic chemotherapy: Secondary | ICD-10-CM | POA: Diagnosis not present

## 2020-07-26 DIAGNOSIS — Z23 Encounter for immunization: Secondary | ICD-10-CM | POA: Diagnosis not present

## 2020-07-26 DIAGNOSIS — Z923 Personal history of irradiation: Secondary | ICD-10-CM | POA: Diagnosis not present

## 2020-07-26 DIAGNOSIS — Z5111 Encounter for antineoplastic chemotherapy: Secondary | ICD-10-CM | POA: Diagnosis not present

## 2020-07-26 DIAGNOSIS — F17210 Nicotine dependence, cigarettes, uncomplicated: Secondary | ICD-10-CM | POA: Diagnosis not present

## 2020-07-26 DIAGNOSIS — Z85048 Personal history of other malignant neoplasm of rectum, rectosigmoid junction, and anus: Secondary | ICD-10-CM | POA: Diagnosis not present

## 2020-07-26 DIAGNOSIS — N189 Chronic kidney disease, unspecified: Secondary | ICD-10-CM | POA: Diagnosis not present

## 2020-07-26 DIAGNOSIS — E8809 Other disorders of plasma-protein metabolism, not elsewhere classified: Secondary | ICD-10-CM | POA: Diagnosis not present

## 2020-07-26 LAB — CBC WITH DIFFERENTIAL/PLATELET
Band Neutrophils: 3 %
Basophils Absolute: 0 10*3/uL (ref 0.0–0.1)
Basophils Relative: 0 %
Eosinophils Absolute: 0 10*3/uL (ref 0.0–0.5)
Eosinophils Relative: 0 %
HCT: 46.4 % (ref 39.0–52.0)
Hemoglobin: 15.9 g/dL (ref 13.0–17.0)
Lymphocytes Relative: 14 %
Lymphs Abs: 0.6 10*3/uL — ABNORMAL LOW (ref 0.7–4.0)
MCH: 33.6 pg (ref 26.0–34.0)
MCHC: 34.3 g/dL (ref 30.0–36.0)
MCV: 98.1 fL (ref 80.0–100.0)
Metamyelocytes Relative: 6 %
Monocytes Absolute: 0.4 10*3/uL (ref 0.1–1.0)
Monocytes Relative: 10 %
Neutro Abs: 2.9 10*3/uL (ref 1.7–7.7)
Neutrophils Relative %: 67 %
Platelets: 104 10*3/uL — ABNORMAL LOW (ref 150–400)
RBC: 4.73 MIL/uL (ref 4.22–5.81)
RDW: 13.5 % (ref 11.5–15.5)
WBC: 4.1 10*3/uL (ref 4.0–10.5)
nRBC: 0 % (ref 0.0–0.2)

## 2020-07-26 MED ORDER — INFLUENZA VAC SPLIT QUAD 0.5 ML IM SUSY
0.5000 mL | PREFILLED_SYRINGE | Freq: Once | INTRAMUSCULAR | Status: AC
  Administered 2020-07-26: 0.5 mL via INTRAMUSCULAR

## 2020-07-26 MED ORDER — PALONOSETRON HCL INJECTION 0.25 MG/5ML
0.2500 mg | Freq: Once | INTRAVENOUS | Status: AC
  Administered 2020-07-26: 0.25 mg via INTRAVENOUS

## 2020-07-26 MED ORDER — PALONOSETRON HCL INJECTION 0.25 MG/5ML
INTRAVENOUS | Status: AC
  Filled 2020-07-26: qty 5

## 2020-07-26 MED ORDER — INFLUENZA VAC SPLIT QUAD 0.5 ML IM SUSY
PREFILLED_SYRINGE | INTRAMUSCULAR | Status: AC
  Filled 2020-07-26: qty 0.5

## 2020-07-26 MED ORDER — SODIUM CHLORIDE 0.9 % IV SOLN: Freq: Once | INTRAVENOUS | Status: AC

## 2020-07-26 MED ORDER — SODIUM CHLORIDE 0.9 % IV SOLN
187.8000 mg | Freq: Once | INTRAVENOUS | Status: AC
  Administered 2020-07-26: 190 mg via INTRAVENOUS
  Filled 2020-07-26: qty 19

## 2020-07-26 MED ORDER — DIPHENHYDRAMINE HCL 50 MG/ML IJ SOLN
50.0000 mg | Freq: Once | INTRAMUSCULAR | Status: AC
  Administered 2020-07-26: 50 mg via INTRAVENOUS

## 2020-07-26 MED ORDER — FAMOTIDINE IN NACL 20-0.9 MG/50ML-% IV SOLN
INTRAVENOUS | Status: AC
  Filled 2020-07-26: qty 50

## 2020-07-26 MED ORDER — FAMOTIDINE IN NACL 20-0.9 MG/50ML-% IV SOLN
20.0000 mg | Freq: Once | INTRAVENOUS | Status: AC
  Administered 2020-07-26: 20 mg via INTRAVENOUS

## 2020-07-26 MED ORDER — SODIUM CHLORIDE 0.9 % IV SOLN
45.0000 mg/m2 | Freq: Once | INTRAVENOUS | Status: AC
  Administered 2020-07-26: 84 mg via INTRAVENOUS
  Filled 2020-07-26: qty 14

## 2020-07-26 MED ORDER — SODIUM CHLORIDE 0.9% FLUSH: 10.0000 mL | INTRAVENOUS | Status: DC | PRN

## 2020-07-26 MED ORDER — DIPHENHYDRAMINE HCL 50 MG/ML IJ SOLN
INTRAMUSCULAR | Status: AC
  Filled 2020-07-26: qty 1

## 2020-07-26 MED ORDER — HEPARIN SOD (PORK) LOCK FLUSH 100 UNIT/ML IV SOLN
500.0000 [IU] | Freq: Once | INTRAVENOUS | Status: AC | PRN
  Administered 2020-07-26: 500 [IU]

## 2020-07-26 MED ORDER — SODIUM CHLORIDE 0.9 % IV SOLN
20.0000 mg | Freq: Once | INTRAVENOUS | Status: AC
  Administered 2020-07-26: 20 mg via INTRAVENOUS
  Filled 2020-07-26: qty 2

## 2020-07-26 NOTE — Patient Instructions (Signed)
[facility] at [facility] Discharge Instructions  Labs drawn from portacath today   Thank you for choosing [facility] at [facility] to provide your oncology and hematology care.  To afford each patient quality time with our provider, please arrive at least 15 minutes before your scheduled appointment time.   If you have a lab appointment with the Cancer Center please come in thru the Main Entrance and check in at the main information desk.  You need to re-schedule your appointment should you arrive 10 or more minutes late.  We strive to give you quality time with our providers, and arriving late affects you and other patients whose appointments are after yours.  Also, if you no show three or more times for appointments you may be dismissed from the clinic at the providers discretion.     Again, thank you for choosing [facility].  Our hope is that these requests will decrease the amount of time that you wait before being seen by our physicians.       _____________________________________________________________  Should you have questions after your visit to [facility], please contact our office at [phone] and follow the prompts.  Our office hours are 8:00 a.m. and 4:30 p.m. Monday - Friday.  Please note that voicemails left after 4:00 p.m. may not be returned until the following business day.  We are closed weekends and major holidays.  You do have access to a nurse 24-7, just call the main number to the clinic [phone] and do not press any options, hold on the line and a nurse will answer the phone.    For prescription refill requests, have your pharmacy contact our office and allow 72 hours.    Due to Covid, you will need to wear a mask upon entering the hospital. If you do not have a mask, a mask will be given to you at the Main Entrance upon arrival. For doctor visits, patients may have 1 support person age 18  or older with them. For treatment visits, patients can not have anyone with them due to social distancing guidelines and our immunocompromised population.     

## 2020-07-26 NOTE — Progress Notes (Signed)
Tolerated infusions w/o adverse reaction.  Alert, in no distress.  VSS.  Discharged ambulatory in stable condition.

## 2020-07-29 DIAGNOSIS — I10 Essential (primary) hypertension: Secondary | ICD-10-CM | POA: Diagnosis not present

## 2020-07-29 DIAGNOSIS — C3490 Malignant neoplasm of unspecified part of unspecified bronchus or lung: Secondary | ICD-10-CM | POA: Diagnosis not present

## 2020-07-29 DIAGNOSIS — C7801 Secondary malignant neoplasm of right lung: Secondary | ICD-10-CM | POA: Diagnosis not present

## 2020-07-29 DIAGNOSIS — C3431 Malignant neoplasm of lower lobe, right bronchus or lung: Secondary | ICD-10-CM | POA: Diagnosis not present

## 2020-07-29 DIAGNOSIS — C801 Malignant (primary) neoplasm, unspecified: Secondary | ICD-10-CM | POA: Diagnosis not present

## 2020-07-30 DIAGNOSIS — C7801 Secondary malignant neoplasm of right lung: Secondary | ICD-10-CM | POA: Diagnosis not present

## 2020-07-30 DIAGNOSIS — C801 Malignant (primary) neoplasm, unspecified: Secondary | ICD-10-CM | POA: Diagnosis not present

## 2020-07-30 DIAGNOSIS — C3431 Malignant neoplasm of lower lobe, right bronchus or lung: Secondary | ICD-10-CM | POA: Diagnosis not present

## 2020-07-31 DIAGNOSIS — C3431 Malignant neoplasm of lower lobe, right bronchus or lung: Secondary | ICD-10-CM | POA: Diagnosis not present

## 2020-07-31 DIAGNOSIS — C801 Malignant (primary) neoplasm, unspecified: Secondary | ICD-10-CM | POA: Diagnosis not present

## 2020-07-31 DIAGNOSIS — C7801 Secondary malignant neoplasm of right lung: Secondary | ICD-10-CM | POA: Diagnosis not present

## 2020-08-01 ENCOUNTER — Inpatient Hospital Stay (HOSPITAL_BASED_OUTPATIENT_CLINIC_OR_DEPARTMENT_OTHER): Payer: Medicare Other | Admitting: Hematology

## 2020-08-01 ENCOUNTER — Inpatient Hospital Stay (HOSPITAL_COMMUNITY): Payer: Medicare Other

## 2020-08-01 ENCOUNTER — Other Ambulatory Visit: Payer: Self-pay

## 2020-08-01 ENCOUNTER — Inpatient Hospital Stay (HOSPITAL_COMMUNITY): Payer: Medicare Other | Attending: Hematology

## 2020-08-01 ENCOUNTER — Encounter (HOSPITAL_COMMUNITY): Payer: Self-pay | Admitting: Hematology

## 2020-08-01 VITALS — BP 123/90 | HR 81 | Temp 96.9°F | Resp 16 | Wt 164.4 lb

## 2020-08-01 DIAGNOSIS — C7801 Secondary malignant neoplasm of right lung: Secondary | ICD-10-CM | POA: Diagnosis not present

## 2020-08-01 DIAGNOSIS — C3491 Malignant neoplasm of unspecified part of right bronchus or lung: Secondary | ICD-10-CM

## 2020-08-01 DIAGNOSIS — M25552 Pain in left hip: Secondary | ICD-10-CM | POA: Diagnosis not present

## 2020-08-01 DIAGNOSIS — R1310 Dysphagia, unspecified: Secondary | ICD-10-CM | POA: Diagnosis not present

## 2020-08-01 DIAGNOSIS — K603 Anal fistula: Secondary | ICD-10-CM | POA: Diagnosis not present

## 2020-08-01 DIAGNOSIS — G629 Polyneuropathy, unspecified: Secondary | ICD-10-CM | POA: Diagnosis not present

## 2020-08-01 DIAGNOSIS — N189 Chronic kidney disease, unspecified: Secondary | ICD-10-CM | POA: Diagnosis not present

## 2020-08-01 DIAGNOSIS — F17210 Nicotine dependence, cigarettes, uncomplicated: Secondary | ICD-10-CM | POA: Diagnosis not present

## 2020-08-01 DIAGNOSIS — C20 Malignant neoplasm of rectum: Secondary | ICD-10-CM | POA: Diagnosis not present

## 2020-08-01 DIAGNOSIS — C3431 Malignant neoplasm of lower lobe, right bronchus or lung: Secondary | ICD-10-CM | POA: Diagnosis not present

## 2020-08-01 DIAGNOSIS — E876 Hypokalemia: Secondary | ICD-10-CM

## 2020-08-01 DIAGNOSIS — D702 Other drug-induced agranulocytosis: Secondary | ICD-10-CM

## 2020-08-01 DIAGNOSIS — C801 Malignant (primary) neoplasm, unspecified: Secondary | ICD-10-CM | POA: Diagnosis not present

## 2020-08-01 LAB — CBC WITH DIFFERENTIAL/PLATELET
Abs Immature Granulocytes: 0.01 10*3/uL (ref 0.00–0.07)
Basophils Absolute: 0 10*3/uL (ref 0.0–0.1)
Basophils Relative: 3 %
Eosinophils Absolute: 0 10*3/uL (ref 0.0–0.5)
Eosinophils Relative: 3 %
HCT: 45.2 % (ref 39.0–52.0)
Hemoglobin: 15.2 g/dL (ref 13.0–17.0)
Immature Granulocytes: 1 %
Lymphocytes Relative: 20 %
Lymphs Abs: 0.2 10*3/uL — ABNORMAL LOW (ref 0.7–4.0)
MCH: 33.2 pg (ref 26.0–34.0)
MCHC: 33.6 g/dL (ref 30.0–36.0)
MCV: 98.7 fL (ref 80.0–100.0)
Monocytes Absolute: 0.1 10*3/uL (ref 0.1–1.0)
Monocytes Relative: 12 %
Neutro Abs: 0.7 10*3/uL — ABNORMAL LOW (ref 1.7–7.7)
Neutrophils Relative %: 61 %
Platelets: 95 10*3/uL — ABNORMAL LOW (ref 150–400)
RBC: 4.58 MIL/uL (ref 4.22–5.81)
RDW: 13.3 % (ref 11.5–15.5)
WBC: 1.2 10*3/uL — CL (ref 4.0–10.5)
nRBC: 0 % (ref 0.0–0.2)

## 2020-08-01 LAB — COMPREHENSIVE METABOLIC PANEL
ALT: 12 U/L (ref 0–44)
AST: 15 U/L (ref 15–41)
Albumin: 3.6 g/dL (ref 3.5–5.0)
Alkaline Phosphatase: 54 U/L (ref 38–126)
Anion gap: 9 (ref 5–15)
BUN: 28 mg/dL — ABNORMAL HIGH (ref 6–20)
CO2: 23 mmol/L (ref 22–32)
Calcium: 9.2 mg/dL (ref 8.9–10.3)
Chloride: 103 mmol/L (ref 98–111)
Creatinine, Ser: 1.44 mg/dL — ABNORMAL HIGH (ref 0.61–1.24)
GFR, Estimated: 56 mL/min — ABNORMAL LOW (ref >60)
Glucose, Bld: 125 mg/dL — ABNORMAL HIGH (ref 70–99)
Potassium: 4.7 mmol/L (ref 3.5–5.1)
Sodium: 135 mmol/L (ref 135–145)
Total Bilirubin: 0.9 mg/dL (ref 0.3–1.2)
Total Protein: 7.6 g/dL (ref 6.5–8.1)

## 2020-08-01 MED ORDER — AMOXICILLIN-POT CLAVULANATE 875-125 MG PO TABS: 1.0000 | ORAL_TABLET | Freq: Two times a day (BID) | ORAL | 0 refills | Status: DC

## 2020-08-01 MED ORDER — DENOSUMAB 120 MG/1.7ML ~~LOC~~ SOLN
SUBCUTANEOUS | Status: AC
  Filled 2020-08-01: qty 1.7

## 2020-08-01 MED ORDER — FILGRASTIM-SNDZ 300 MCG/0.5ML IJ SOSY
300.0000 ug | PREFILLED_SYRINGE | Freq: Once | INTRAMUSCULAR | Status: AC
  Administered 2020-08-01: 300 ug via SUBCUTANEOUS
  Filled 2020-08-01: qty 0.5

## 2020-08-01 MED ORDER — SODIUM CHLORIDE 0.9 % IV SOLN
Freq: Once | INTRAVENOUS | Status: AC
  Filled 2020-08-01: qty 10

## 2020-08-01 MED ORDER — HEPARIN SOD (PORK) LOCK FLUSH 100 UNIT/ML IV SOLN
500.0000 [IU] | Freq: Once | INTRAVENOUS | Status: AC
  Administered 2020-08-01: 500 [IU] via INTRAVENOUS

## 2020-08-01 NOTE — Progress Notes (Signed)
CRITICAL VALUE ALERT  Critical Value:  WBC 1.2  Date & Time Notied:  08/01/2020 at 1106  Provider Notified: Dr. Delton Coombes  Orders Received/Actions taken: hold chemotherapy today; give Zaxio 300 mcg [place] x 1 dose.
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
## 2020-08-01 NOTE — Progress Notes (Signed)
No tx today per Dr. Delton Coombes.  Orders rec'd to give IVF with electrolytes over 2 hours and Zarxio injection 300 mcg x 1 dose for neutropenia.   Tolerated infusion w/o adverse reaction.  Alert, in no distress.  VSS.  Discharged ambulatory in stable condition.

## 2020-08-01 NOTE — Patient Instructions (Addendum)
[place] at [facility] Discharge Instructions  You were seen today by Dr. Delton Coombes. He went over your recent results. You did not receive your treatment today, only WBC booster shot. You will be prescribed antibiotics, Augmentin, to take for 7 days for your fistula. Drink plenty of water daily to flush out your kidneys after chemotherapy. Dr. Delton Coombes will see you back in 1 week for labs and follow up.   Thank you for choosing [place] at [facility] to provide your oncology and hematology care.  To afford each patient quality time with our provider, please arrive at least 15 minutes before your scheduled appointment time.   If you have a lab appointment with the [place] please come in thru the Main Entrance and check in at the main information desk  You need to re-schedule your appointment should you arrive 10 or more minutes late.  We strive to give you quality time with our providers, and arriving late affects you and other patients whose appointments are after yours.  Also, if you no show three or more times for appointments you may be dismissed from the clinic at the providers discretion.     Again, thank you for choosing [facility].  Our hope is that these requests will decrease the amount of time that you wait before being seen by our physicians.       _____________________________________________________________  Should you have questions after your visit to [facility], please contact our office at [phone] between the hours of 8:00 a.m. and 4:30 p.m.  Voicemails left after 4:00 p.m. will not be returned until the following business day.  For prescription refill requests, have your pharmacy contact our office and allow 72 hours.    Cancer Center Support Programs:   > Cancer Support Group  2nd Tuesday of the month 1pm-2pm, Journey Room

## 2020-08-01 NOTE — Patient Instructions (Signed)
[facility] at [facility] Discharge Instructions  Labs drawn from portacath today   Thank you for choosing [facility] at [facility] to provide your oncology and hematology care.  To afford each patient quality time with our provider, please arrive at least 15 minutes before your scheduled appointment time.   If you have a lab appointment with the Cancer Center please come in thru the Main Entrance and check in at the main information desk.  You need to re-schedule your appointment should you arrive 10 or more minutes late.  We strive to give you quality time with our providers, and arriving late affects you and other patients whose appointments are after yours.  Also, if you no show three or more times for appointments you may be dismissed from the clinic at the providers discretion.     Again, thank you for choosing [facility].  Our hope is that these requests will decrease the amount of time that you wait before being seen by our physicians.       _____________________________________________________________  Should you have questions after your visit to [facility], please contact our office at [phone] and follow the prompts.  Our office hours are 8:00 a.m. and 4:30 p.m. Monday - Friday.  Please note that voicemails left after 4:00 p.m. may not be returned until the following business day.  We are closed weekends and major holidays.  You do have access to a nurse 24-7, just call the main number to the clinic [phone] and do not press any options, hold on the line and a nurse will answer the phone.    For prescription refill requests, have your pharmacy contact our office and allow 72 hours.    Due to Covid, you will need to wear a mask upon entering the hospital. If you do not have a mask, a mask will be given to you at the Main Entrance upon arrival. For doctor visits, patients may have 1 support person age 18  or older with them. For treatment visits, patients can not have anyone with them due to social distancing guidelines and our immunocompromised population.     

## 2020-08-02 DIAGNOSIS — C7801 Secondary malignant neoplasm of right lung: Secondary | ICD-10-CM | POA: Diagnosis not present

## 2020-08-02 DIAGNOSIS — C3431 Malignant neoplasm of lower lobe, right bronchus or lung: Secondary | ICD-10-CM | POA: Diagnosis not present

## 2020-08-02 DIAGNOSIS — C801 Malignant (primary) neoplasm, unspecified: Secondary | ICD-10-CM | POA: Diagnosis not present

## 2020-08-05 ENCOUNTER — Other Ambulatory Visit (HOSPITAL_COMMUNITY): Payer: Self-pay

## 2020-08-05 DIAGNOSIS — C801 Malignant (primary) neoplasm, unspecified: Secondary | ICD-10-CM | POA: Diagnosis not present

## 2020-08-05 DIAGNOSIS — C7801 Secondary malignant neoplasm of right lung: Secondary | ICD-10-CM | POA: Diagnosis not present

## 2020-08-05 DIAGNOSIS — C3431 Malignant neoplasm of lower lobe, right bronchus or lung: Secondary | ICD-10-CM | POA: Diagnosis not present

## 2020-08-05 MED ORDER — SUCRALFATE 1 GM/10ML PO SUSP: 1.0000 g | Freq: Three times a day (TID) | ORAL | 0 refills | Status: DC

## 2020-08-05 NOTE — Telephone Encounter (Signed)
Call received from [facility] that the patient reports inability to eat and a 12lb weight loss in one week. Dr. Delton Coombes made aware and has ordered Carafate 1g four times daily. Prescription sent to [facility]. Patient aware and verbalized understanding.

## 2020-08-06 DIAGNOSIS — C7801 Secondary malignant neoplasm of right lung: Secondary | ICD-10-CM | POA: Diagnosis not present

## 2020-08-06 DIAGNOSIS — C3431 Malignant neoplasm of lower lobe, right bronchus or lung: Secondary | ICD-10-CM | POA: Diagnosis not present

## 2020-08-06 DIAGNOSIS — C801 Malignant (primary) neoplasm, unspecified: Secondary | ICD-10-CM | POA: Diagnosis not present

## 2020-08-08 ENCOUNTER — Inpatient Hospital Stay (HOSPITAL_COMMUNITY): Payer: Medicare Other

## 2020-08-08 ENCOUNTER — Other Ambulatory Visit: Payer: Self-pay

## 2020-08-08 ENCOUNTER — Other Ambulatory Visit (HOSPITAL_COMMUNITY): Payer: Self-pay

## 2020-08-08 ENCOUNTER — Inpatient Hospital Stay (HOSPITAL_BASED_OUTPATIENT_CLINIC_OR_DEPARTMENT_OTHER): Payer: Medicare Other | Admitting: Hematology

## 2020-08-08 VITALS — BP 151/95 | HR 84 | Temp 97.0°F | Resp 18 | Wt 153.0 lb

## 2020-08-08 DIAGNOSIS — C20 Malignant neoplasm of rectum: Secondary | ICD-10-CM | POA: Diagnosis not present

## 2020-08-08 DIAGNOSIS — E876 Hypokalemia: Secondary | ICD-10-CM

## 2020-08-08 DIAGNOSIS — C7801 Secondary malignant neoplasm of right lung: Secondary | ICD-10-CM | POA: Diagnosis not present

## 2020-08-08 DIAGNOSIS — R1310 Dysphagia, unspecified: Secondary | ICD-10-CM | POA: Diagnosis not present

## 2020-08-08 DIAGNOSIS — C801 Malignant (primary) neoplasm, unspecified: Secondary | ICD-10-CM | POA: Diagnosis not present

## 2020-08-08 DIAGNOSIS — C3491 Malignant neoplasm of unspecified part of right bronchus or lung: Secondary | ICD-10-CM

## 2020-08-08 DIAGNOSIS — G629 Polyneuropathy, unspecified: Secondary | ICD-10-CM | POA: Diagnosis not present

## 2020-08-08 DIAGNOSIS — N189 Chronic kidney disease, unspecified: Secondary | ICD-10-CM | POA: Diagnosis not present

## 2020-08-08 DIAGNOSIS — C3431 Malignant neoplasm of lower lobe, right bronchus or lung: Secondary | ICD-10-CM | POA: Diagnosis not present

## 2020-08-08 DIAGNOSIS — K603 Anal fistula: Secondary | ICD-10-CM | POA: Diagnosis not present

## 2020-08-08 DIAGNOSIS — F17210 Nicotine dependence, cigarettes, uncomplicated: Secondary | ICD-10-CM | POA: Diagnosis not present

## 2020-08-08 DIAGNOSIS — Z95828 Presence of other vascular implants and grafts: Secondary | ICD-10-CM

## 2020-08-08 DIAGNOSIS — M25552 Pain in left hip: Secondary | ICD-10-CM | POA: Diagnosis not present

## 2020-08-08 LAB — COMPREHENSIVE METABOLIC PANEL
ALT: 20 U/L (ref 0–44)
AST: 17 U/L (ref 15–41)
Albumin: 3.5 g/dL (ref 3.5–5.0)
Alkaline Phosphatase: 74 U/L (ref 38–126)
Anion gap: 11 (ref 5–15)
BUN: 36 mg/dL — ABNORMAL HIGH (ref 6–20)
CO2: 22 mmol/L (ref 22–32)
Calcium: 9.3 mg/dL (ref 8.9–10.3)
Chloride: 105 mmol/L (ref 98–111)
Creatinine, Ser: 2.05 mg/dL — ABNORMAL HIGH (ref 0.61–1.24)
GFR, Estimated: 36 mL/min — ABNORMAL LOW (ref >60)
Glucose, Bld: 123 mg/dL — ABNORMAL HIGH (ref 70–99)
Potassium: 4.1 mmol/L (ref 3.5–5.1)
Sodium: 138 mmol/L (ref 135–145)
Total Bilirubin: 0.6 mg/dL (ref 0.3–1.2)
Total Protein: 8.2 g/dL — ABNORMAL HIGH (ref 6.5–8.1)

## 2020-08-08 MED ORDER — SODIUM CHLORIDE 0.9 % IV SOLN
Freq: Once | INTRAVENOUS | Status: AC
  Filled 2020-08-08: qty 10

## 2020-08-08 MED ORDER — HYDROCODONE-ACETAMINOPHEN 5-325 MG PO TABS: 1.0000 | ORAL_TABLET | ORAL | 0 refills | Status: DC | PRN

## 2020-08-08 MED ORDER — SUCRALFATE 1 GM/10ML PO SUSP
1.0000 g | ORAL | 1 refills | Status: DC | PRN
Start: 2020-08-08 — End: 2020-08-14

## 2020-08-08 MED ORDER — SODIUM CHLORIDE 0.9% FLUSH: 10.0000 mL | INTRAVENOUS | Status: DC | PRN

## 2020-08-08 MED ORDER — HEPARIN SOD (PORK) LOCK FLUSH 100 UNIT/ML IV SOLN: 500.0000 [IU] | Freq: Once | INTRAVENOUS | Status: AC
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
## 2020-08-08 NOTE — Patient Instructions (Signed)
[facility] at [facility] Discharge Instructions  Labs drawn from portacath today   Thank you for choosing [facility] at [facility] to provide your oncology and hematology care.  To afford each patient quality time with our provider, please arrive at least 15 minutes before your scheduled appointment time.   If you have a lab appointment with the Cancer Center please come in thru the Main Entrance and check in at the main information desk.  You need to re-schedule your appointment should you arrive 10 or more minutes late.  We strive to give you quality time with our providers, and arriving late affects you and other patients whose appointments are after yours.  Also, if you no show three or more times for appointments you may be dismissed from the clinic at the providers discretion.     Again, thank you for choosing [facility].  Our hope is that these requests will decrease the amount of time that you wait before being seen by our physicians.       _____________________________________________________________  Should you have questions after your visit to [facility], please contact our office at [phone] and follow the prompts.  Our office hours are 8:00 a.m. and 4:30 p.m. Monday - Friday.  Please note that voicemails left after 4:00 p.m. may not be returned until the following business day.  We are closed weekends and major holidays.  You do have access to a nurse 24-7, just call the main number to the clinic [phone] and do not press any options, hold on the line and a nurse will answer the phone.    For prescription refill requests, have your pharmacy contact our office and allow 72 hours.    Due to Covid, you will need to wear a mask upon entering the hospital. If you do not have a mask, a mask will be given to you at the Main Entrance upon arrival. For doctor visits, patients may have 1 support person age 18  or older with them. For treatment visits, patients can not have anyone with them due to social distancing guidelines and our immunocompromised population.     

## 2020-08-08 NOTE — Progress Notes (Signed)
Per Dr. Delton Coombes we will hold treatment today due to patients 11lb weight loss. House fluids to be given over 2 hours today.  Hydration tolerated without incident or complaint. VSS upon completion. Port flushed and deaccessed per protocol, see MAR and IV flowsheet for details. Discharged in satisfactory condition with follow up instructions.

## 2020-08-08 NOTE — Patient Instructions (Signed)
Dehydration, Adult Dehydration is a condition in which there is not enough water or other fluids in the body. This happens when a person loses more fluids than he or she takes in. Important organs, such as the kidneys, brain, and heart, cannot function without a proper amount of fluids. Any loss of fluids from the body can lead to dehydration. Dehydration can be mild, moderate, or severe. It should be treated right away to prevent it from becoming severe. What are the causes? Dehydration may be caused by:  Conditions that cause loss of water or other fluids, such as diarrhea, vomiting, or sweating or urinating a lot.  Not drinking enough fluids, especially when you are ill or doing activities that require a lot of energy.  Other illnesses and conditions, such as fever or infection.  Certain medicines, such as medicines that remove excess fluid from the body (diuretics).  Lack of safe drinking water.  Not being able to get enough water and food. What increases the risk? The following factors may make you more likely to develop this condition:  Having a long-term (chronic) illness that has not been treated properly, such as diabetes, heart disease, or kidney disease.  Being 60 years of age or older.  Having a disability.  Living in a place that is high in altitude, where thinner, drier air causes more fluid loss.  Doing exercises that put stress on your body for a long time (endurance sports). What are the signs or symptoms? Symptoms of dehydration depend on how severe it is. Mild or moderate dehydration  Thirst.  Dry lips or dry mouth.  Dizziness or light-headedness, especially when standing up from a seated position.  Muscle cramps.  Dark urine. Urine may be the color of tea.  Less urine or tears produced than usual.  Headache. Severe dehydration  Changes in skin. Your skin may be cold and clammy, blotchy, or pale. Your skin also may not return to normal after being  lightly pinched and released.  Little or no tears, urine, or sweat.  Changes in vital signs, such as rapid breathing and low blood pressure. Your pulse may be weak or may be faster than 100 beats a minute when you are sitting still.  Other changes, such as: ? Feeling very thirsty. ? Sunken eyes. ? Cold hands and feet. ? Confusion. ? Being very tired (lethargic) or having trouble waking from sleep. ? Short-term weight loss. ? Loss of consciousness. How is this diagnosed? This condition is diagnosed based on your symptoms and a physical exam. You may have blood and urine tests to help confirm the diagnosis. How is this treated? Treatment for this condition depends on how severe it is. Treatment should be started right away. Do not wait until dehydration becomes severe. Severe dehydration is an emergency and needs to be treated in a hospital.  Mild or moderate dehydration can be treated at home. You may be asked to: ? Drink more fluids. ? Drink an oral rehydration solution (ORS). This drink helps restore proper amounts of fluids and salts and minerals in the blood (electrolytes).  Severe dehydration can be treated: ? With IV fluids. ? By correcting abnormal levels of electrolytes. This is often done by giving electrolytes through a tube that is passed through your nose and into your stomach (nasogastric tube, or NG tube). ? By treating the underlying cause of dehydration. Follow these instructions at home: Oral rehydration solution If told by your health care provider, drink an ORS:  Make  an ORS by following instructions on the package.  Start by drinking small amounts, about  cup (120 mL) every 5-10 minutes.  Slowly increase how much you drink until you have taken the amount recommended by your health care provider. Eating and drinking         Drink enough clear fluid to keep your urine pale yellow. If you were told to drink an ORS, finish the ORS first and then start slowly  drinking other clear fluids. Drink fluids such as: ? Water. Do not drink only water. Doing that can lead to hyponatremia, which is having too little salt (sodium) in the body. ? Water from ice chips you suck on. ? Fruit juice that you have added water to (diluted fruit juice). ? Low-calorie sports drinks.  Eat foods that contain a healthy balance of electrolytes, such as bananas, oranges, potatoes, tomatoes, and spinach.  Do not drink alcohol.  Avoid the following: ? Drinks that contain a lot of sugar. These include high-calorie sports drinks, fruit juice that is not diluted, and soda. ? Caffeine. ? Foods that are greasy or contain a lot of fat or sugar. General instructions  Take over-the-counter and prescription medicines only as told by your health care provider.  Do not take sodium tablets. Doing that can lead to having too much sodium in the body (hypernatremia).  Return to your normal activities as told by your health care provider. Ask your health care provider what activities are safe for you.  Keep all follow-up visits as told by your health care provider. This is important. Contact a health care provider if:  You have muscle cramps, pain, or discomfort, such as: ? Pain in your abdomen and the pain gets worse or stays in one area (localizes). ? Stiff neck.  You have a rash.  You are more irritable than usual.  You are sleepier or have a harder time waking than usual.  You feel weak or dizzy.  You feel very thirsty. Get help right away if you have:  Any symptoms of severe dehydration.  Symptoms of vomiting, such as: ? You cannot eat or drink without vomiting. ? Vomiting gets worse or does not go away. ? Vomit includes blood or green matter (bile).  Symptoms that get worse with treatment.  A fever.  A severe headache.  Problems with urination or bowel movements, such as: ? Diarrhea that gets worse or does not go away. ? Blood in your stool (feces). This  may cause stool to look black and tarry. ? Not urinating, or urinating only a small amount of very dark urine, within 6-8 hours.  Trouble breathing. These symptoms may represent a serious problem that is an emergency. Do not wait to see if the symptoms will go away. Get medical help right away. Call your local emergency services (911 in the [place] Do not drive yourself to the hospital. Summary  Dehydration is a condition in which there is not enough water or other fluids in the body. This happens when a person loses more fluids than he or she takes in.  Treatment for this condition depends on how severe it is. Treatment should be started right away. Do not wait until dehydration becomes severe.  Drink enough clear fluid to keep your urine pale yellow. If you were told to drink an oral rehydration solution (ORS), finish the ORS first and then start slowly drinking other clear fluids.  Take over-the-counter and prescription medicines only as told by your health care  provider.  Get help right away if you have any symptoms of severe dehydration. This information is not intended to replace advice given to you by your health care provider. Make sure you discuss any questions you have with your health care provider. Document Revised: 04/27/2019 Document Reviewed: 04/27/2019 Elsevier Patient Education  [place].  _______________________________________________________________  Thank you for choosing [place] at [facility] to provide your oncology and hematology care.  To afford each patient quality time with our providers, please arrive at least 15 minutes before your scheduled appointment.  You need to re-schedule your appointment if you arrive 10 or more minutes late.  We strive to give you quality time with our providers, and arriving late affects you and other patients whose appointments are after yours.  Also, if you no show three or more times for appointments  you may be dismissed from the clinic.  Again, thank you for choosing [place] at [place] hope is that these requests will allow you access to exceptional care and in a timely manner. _______________________________________________________________  If you have questions after your visit, please contact our office at [phone] between the hours of 8:30 a.m. and 5:00 p.m. Voicemails left after 4:30 p.m. will not be returned until the following business day. _______________________________________________________________  For prescription refill requests, have your pharmacy contact our office. _______________________________________________________________  Recommendations made by the consultant and any test results will be sent to your referring physician. _______________________________________________________________

## 2020-08-08 NOTE — Progress Notes (Signed)
Patient was assessed by Dr. Delton Coombes and labs have been reviewed.  Patient will not receive treatment today.  Patient has lost 11lbs, we have refilled his Carafate and increased Hydrocodone to every 4 hours. Per Dr. Raliegh Ip patient to get fluids with electrolytes. Primary RN and pharmacy aware.

## 2020-08-08 NOTE — Patient Instructions (Signed)
[place] at [facility] Discharge Instructions  You were seen today by Dr. Delton Coombes. He went over your recent results. You did not receive your treatment today. Start taking Norco every 4 hours for your pain and Carafate every 3 hours. Dr. Delton Coombes will see you back in 1 week for labs and follow up.   Thank you for choosing [place] at [name] to provide your oncology and hematology care.  To afford each patient quality time with our provider, please arrive at least 15 minutes before your scheduled appointment time.   If you have a lab appointment with the [facility] please come in thru the Main Entrance and check in at the main information desk  You need to re-schedule your appointment should you arrive 10 or more minutes late.  We strive to give you quality time with our providers, and arriving late affects you and other patients whose appointments are after yours.  Also, if you no show three or more times for appointments you may be dismissed from the clinic at the providers discretion.     Again, thank you for choosing [facility].  Our hope is that these requests will decrease the amount of time that you wait before being seen by our physicians.       _____________________________________________________________  Should you have questions after your visit to [facility], please contact our office at [phone] between the hours of 8:00 a.m. and 4:30 p.m.  Voicemails left after 4:00 p.m. will not be returned until the following business day.  For prescription refill requests, have your pharmacy contact our office and allow 72 hours.    Cancer Center Support Programs:   > Cancer Support Group  2nd Tuesday of the month 1pm-2pm, Journey Room

## 2020-08-09 ENCOUNTER — Ambulatory Visit (HOSPITAL_COMMUNITY): Payer: Medicare Other

## 2020-08-09 ENCOUNTER — Inpatient Hospital Stay (HOSPITAL_COMMUNITY): Payer: Medicare Other

## 2020-08-09 DIAGNOSIS — C801 Malignant (primary) neoplasm, unspecified: Secondary | ICD-10-CM | POA: Diagnosis not present

## 2020-08-09 DIAGNOSIS — M25552 Pain in left hip: Secondary | ICD-10-CM | POA: Diagnosis not present

## 2020-08-09 DIAGNOSIS — R1310 Dysphagia, unspecified: Secondary | ICD-10-CM | POA: Diagnosis not present

## 2020-08-09 DIAGNOSIS — N189 Chronic kidney disease, unspecified: Secondary | ICD-10-CM | POA: Diagnosis not present

## 2020-08-09 DIAGNOSIS — C3431 Malignant neoplasm of lower lobe, right bronchus or lung: Secondary | ICD-10-CM | POA: Diagnosis not present

## 2020-08-09 DIAGNOSIS — G629 Polyneuropathy, unspecified: Secondary | ICD-10-CM | POA: Diagnosis not present

## 2020-08-09 DIAGNOSIS — K603 Anal fistula: Secondary | ICD-10-CM | POA: Diagnosis not present

## 2020-08-09 DIAGNOSIS — C7801 Secondary malignant neoplasm of right lung: Secondary | ICD-10-CM | POA: Diagnosis not present

## 2020-08-09 DIAGNOSIS — C20 Malignant neoplasm of rectum: Secondary | ICD-10-CM | POA: Diagnosis not present

## 2020-08-09 DIAGNOSIS — F17210 Nicotine dependence, cigarettes, uncomplicated: Secondary | ICD-10-CM | POA: Diagnosis not present

## 2020-08-09 LAB — CBC WITH DIFFERENTIAL/PLATELET
Abs Immature Granulocytes: 0.03 10*3/uL (ref 0.00–0.07)
Basophils Absolute: 0.1 10*3/uL (ref 0.0–0.1)
Basophils Relative: 1 %
Eosinophils Absolute: 0 10*3/uL (ref 0.0–0.5)
Eosinophils Relative: 1 %
HCT: 48.5 % (ref 39.0–52.0)
Hemoglobin: 16 g/dL (ref 13.0–17.0)
Immature Granulocytes: 1 %
Lymphocytes Relative: 9 %
Lymphs Abs: 0.5 10*3/uL — ABNORMAL LOW (ref 0.7–4.0)
MCH: 33.6 pg (ref 26.0–34.0)
MCHC: 33 g/dL (ref 30.0–36.0)
MCV: 101.9 fL — ABNORMAL HIGH (ref 80.0–100.0)
Monocytes Absolute: 0.6 10*3/uL (ref 0.1–1.0)
Monocytes Relative: 12 %
Neutro Abs: 4 10*3/uL (ref 1.7–7.7)
Neutrophils Relative %: 76 %
Platelets: 161 10*3/uL (ref 150–400)
RBC: 4.76 MIL/uL (ref 4.22–5.81)
RDW: 13.8 % (ref 11.5–15.5)
WBC: 5.2 10*3/uL (ref 4.0–10.5)
nRBC: 0.4 % — ABNORMAL HIGH (ref 0.0–0.2)

## 2020-08-09 MED ORDER — SODIUM CHLORIDE 0.9% FLUSH: 10.0000 mL | INTRAVENOUS | Status: DC | PRN

## 2020-08-09 MED ORDER — SODIUM CHLORIDE 0.9 % IV SOLN: INTRAVENOUS | Status: AC

## 2020-08-09 MED ORDER — HEPARIN SOD (PORK) LOCK FLUSH 100 UNIT/ML IV SOLN
500.0000 [IU] | Freq: Once | INTRAVENOUS | Status: AC
  Administered 2020-08-09: 500 [IU] via INTRAVENOUS

## 2020-08-09 NOTE — Progress Notes (Signed)
Patient here today for fluids per MD orders.  He reports same symptoms that have been going on. He is not able to eat/drink anything due to the pain / burning in the center of his chest.  He states that it burns majority of the time.  He had visit with nutritionist today who gave him orders for nutrition.  He has tolerated fluids without incidence.  Patient discharged ambulatory and in stable condition from clinic.  Patient to follow up as scheduled but advised that if he feels like he needs fluids next week to give [place] a call.  He verbalizes understanding.

## 2020-08-09 NOTE — Progress Notes (Signed)
Nutrition Assessment   Reason for Assessment:   Patient identified on Malnutrition Screening report for weight loss and poor appetite   ASSESSMENT:  59 year old male with rectal cancer with mets to lung cancer. Past medical history of CAD, HTN, GERD, PAD.  Patient receiving concurrent chemotherapy and radiation.   Met with patient today during fluids.  Patient with painful swallowing of saliva and most foods and liquids.  Can tolerate hot coffee and chicken broth. Reports that he tried to warm the ensure shakes but couldn't drink them.  Seems to tolerate the ensure clear better.    Patient is using pain medication more frequently and carafate.     Medications: carafate, hydrocodone   Labs: reviewed   Anthropometrics:   Height: 69 inches Weight: 153 lb on 11/11 11/4 164 lb 7/30 165 lb BMI: 22 7% weight loss in the last 7 days, significant  Estimated Energy Needs  Kcals: 2100-2450 Protein: 105-122 g Fluid: > 2 L   NUTRITION DIAGNOSIS: Inadequate oral intake related to cancer related treatment side effects as evidenced by 7% weight loss in 7 days, radiation esophagitis    INTERVENTION:  Provided recipes for pureed soups with high calories and protein to try as well as high calorie warm drinks (hot chocolate, etc) Encouraged small frequent sips/bits Contact information provided   MONITORING, EVALUATION, GOAL: weight trends, intake   Next Visit: Dec 3rd phone  Juan Wheeler B. Zenia Resides, [place], [place] Registered Dietitian [phone] (mobile)

## 2020-08-12 DIAGNOSIS — C3431 Malignant neoplasm of lower lobe, right bronchus or lung: Secondary | ICD-10-CM | POA: Diagnosis not present

## 2020-08-12 DIAGNOSIS — C7801 Secondary malignant neoplasm of right lung: Secondary | ICD-10-CM | POA: Diagnosis not present

## 2020-08-12 DIAGNOSIS — C801 Malignant (primary) neoplasm, unspecified: Secondary | ICD-10-CM | POA: Diagnosis not present

## 2020-08-13 DIAGNOSIS — C7801 Secondary malignant neoplasm of right lung: Secondary | ICD-10-CM | POA: Diagnosis not present

## 2020-08-13 DIAGNOSIS — C3431 Malignant neoplasm of lower lobe, right bronchus or lung: Secondary | ICD-10-CM | POA: Diagnosis not present

## 2020-08-13 DIAGNOSIS — C801 Malignant (primary) neoplasm, unspecified: Secondary | ICD-10-CM | POA: Diagnosis not present

## 2020-08-14 ENCOUNTER — Other Ambulatory Visit (HOSPITAL_COMMUNITY): Payer: Self-pay

## 2020-08-14 MED ORDER — SUCRALFATE 1 GM/10ML PO SUSP
1.0000 g | ORAL | 1 refills | Status: DC | PRN
Start: 2020-08-14 — End: 2021-01-18

## 2020-08-15 ENCOUNTER — Inpatient Hospital Stay (HOSPITAL_COMMUNITY): Payer: Medicare Other

## 2020-08-15 ENCOUNTER — Other Ambulatory Visit: Payer: Self-pay

## 2020-08-15 ENCOUNTER — Inpatient Hospital Stay (HOSPITAL_BASED_OUTPATIENT_CLINIC_OR_DEPARTMENT_OTHER): Payer: Medicare Other | Admitting: Hematology

## 2020-08-15 VITALS — BP 134/90 | HR 74 | Temp 97.1°F | Resp 18 | Wt 157.5 lb

## 2020-08-15 DIAGNOSIS — F17210 Nicotine dependence, cigarettes, uncomplicated: Secondary | ICD-10-CM | POA: Diagnosis not present

## 2020-08-15 DIAGNOSIS — C3431 Malignant neoplasm of lower lobe, right bronchus or lung: Secondary | ICD-10-CM | POA: Diagnosis not present

## 2020-08-15 DIAGNOSIS — C20 Malignant neoplasm of rectum: Secondary | ICD-10-CM | POA: Diagnosis not present

## 2020-08-15 DIAGNOSIS — M25552 Pain in left hip: Secondary | ICD-10-CM | POA: Diagnosis not present

## 2020-08-15 DIAGNOSIS — C3491 Malignant neoplasm of unspecified part of right bronchus or lung: Secondary | ICD-10-CM

## 2020-08-15 DIAGNOSIS — R1310 Dysphagia, unspecified: Secondary | ICD-10-CM | POA: Diagnosis not present

## 2020-08-15 DIAGNOSIS — Z95828 Presence of other vascular implants and grafts: Secondary | ICD-10-CM

## 2020-08-15 DIAGNOSIS — K603 Anal fistula: Secondary | ICD-10-CM | POA: Diagnosis not present

## 2020-08-15 DIAGNOSIS — G629 Polyneuropathy, unspecified: Secondary | ICD-10-CM | POA: Diagnosis not present

## 2020-08-15 DIAGNOSIS — N189 Chronic kidney disease, unspecified: Secondary | ICD-10-CM | POA: Diagnosis not present

## 2020-08-15 LAB — CBC WITH DIFFERENTIAL/PLATELET
Abs Immature Granulocytes: 0.01 10*3/uL (ref 0.00–0.07)
Basophils Absolute: 0 10*3/uL (ref 0.0–0.1)
Basophils Relative: 0 %
Eosinophils Absolute: 0.1 10*3/uL (ref 0.0–0.5)
Eosinophils Relative: 1 %
HCT: 41.4 % (ref 39.0–52.0)
Hemoglobin: 14 g/dL (ref 13.0–17.0)
Immature Granulocytes: 0 %
Lymphocytes Relative: 11 %
Lymphs Abs: 0.5 10*3/uL — ABNORMAL LOW (ref 0.7–4.0)
MCH: 33.1 pg (ref 26.0–34.0)
MCHC: 33.8 g/dL (ref 30.0–36.0)
MCV: 97.9 fL (ref 80.0–100.0)
Monocytes Absolute: 0.5 10*3/uL (ref 0.1–1.0)
Monocytes Relative: 10 %
Neutro Abs: 3.5 10*3/uL (ref 1.7–7.7)
Neutrophils Relative %: 78 %
Platelets: 193 10*3/uL (ref 150–400)
RBC: 4.23 MIL/uL (ref 4.22–5.81)
RDW: 13.8 % (ref 11.5–15.5)
WBC: 4.5 10*3/uL (ref 4.0–10.5)
nRBC: 0 % (ref 0.0–0.2)

## 2020-08-15 LAB — COMPREHENSIVE METABOLIC PANEL
ALT: 17 U/L (ref 0–44)
AST: 18 U/L (ref 15–41)
Albumin: 3 g/dL — ABNORMAL LOW (ref 3.5–5.0)
Alkaline Phosphatase: 58 U/L (ref 38–126)
Anion gap: 6 (ref 5–15)
BUN: 8 mg/dL (ref 6–20)
CO2: 23 mmol/L (ref 22–32)
Calcium: 8.7 mg/dL — ABNORMAL LOW (ref 8.9–10.3)
Chloride: 109 mmol/L (ref 98–111)
Creatinine, Ser: 1.05 mg/dL (ref 0.61–1.24)
GFR, Estimated: >60 mL/min (ref >60)
Glucose, Bld: 102 mg/dL — ABNORMAL HIGH (ref 70–99)
Potassium: 3.8 mmol/L (ref 3.5–5.1)
Sodium: 138 mmol/L (ref 135–145)
Total Bilirubin: 0.5 mg/dL (ref 0.3–1.2)
Total Protein: 6.8 g/dL (ref 6.5–8.1)

## 2020-08-15 NOTE — Progress Notes (Signed)
Juan Wheeler presents today for [employer]. He is still having significant pain in his throat and chest, especially with eating and drinking. He states he has been drinking the clear ensures but it takes him 2-3 hours to drink one. I encouraged him to continue taking in what he can and to continue his pain medicine as well as his mouth rinses to help with the pain. Labs and vitals are stable. Per Dr. Delton Coombes we will hold this last cycle due to his pain and nutritional status. He finished radiation therapy on Tuesday of this week. Pt aware of plan. Port deaccessed per protocol and pt discharged self ambulatory in satisfactory condition.

## 2020-08-15 NOTE — Patient Instructions (Signed)
[place] at [facility] Discharge Instructions  You were seen today by Dr. Delton Coombes. He went over your recent results. You received your treatment today. You will be scheduled for a CT scan of your chest before your next visit. Dr. Delton Coombes will see you back in 3 weeks for labs and follow up.   Thank you for choosing [place] at [facility] to provide your oncology and hematology care.  To afford each patient quality time with our provider, please arrive at least 15 minutes before your scheduled appointment time.   If you have a lab appointment with the [facility] please come in thru the Main Entrance and check in at the main information desk  You need to re-schedule your appointment should you arrive 10 or more minutes late.  We strive to give you quality time with our providers, and arriving late affects you and other patients whose appointments are after yours.  Also, if you no show three or more times for appointments you may be dismissed from the clinic at the providers discretion.     Again, thank you for choosing [facility].  Our hope is that these requests will decrease the amount of time that you wait before being seen by our physicians.       _____________________________________________________________  Should you have questions after your visit to [facility], please contact our office at [phone] between the hours of 8:00 a.m. and 4:30 p.m.  Voicemails left after 4:00 p.m. will not be returned until the following business day.  For prescription refill requests, have your pharmacy contact our office and allow 72 hours.    Cancer Center Support Programs:   > Cancer Support Group  2nd Tuesday of the month 1pm-2pm, Journey Room

## 2020-08-15 NOTE — Progress Notes (Signed)
Patient assessed and labs reviewed by Dr. Delton Coombes. Hold treatment today per Dr. Delton Coombes. Primary RN and Pharmacy aware.
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
## 2020-08-15 NOTE — Patient Instructions (Signed)
[facility] Discharge Instructions for Patients Receiving Chemotherapy   Beginning January 23rd 2017 lab work for the [facility], [facility], [facility] will be done in the  Main lab at [facility] on 1st floor. If you have a lab appointment with the [facility] please come in thru the  Main Entrance and check in at the main information desk   Today you received the following chemotherapy agents Carbo/Taxol  To help prevent nausea and vomiting after your treatment, we encourage you to take your nausea medication   If you develop nausea and vomiting, or diarrhea that is not controlled by your medication, call the clinic.  The clinic phone number is [phone]. Office hours are Monday-Friday 8:30am-5:00pm.  BELOW ARE SYMPTOMS THAT SHOULD BE REPORTED IMMEDIATELY:  *FEVER GREATER THAN 101.0 F  *CHILLS WITH OR WITHOUT FEVER  NAUSEA AND VOMITING THAT IS NOT CONTROLLED WITH YOUR NAUSEA MEDICATION  *UNUSUAL SHORTNESS OF BREATH  *UNUSUAL BRUISING OR BLEEDING  TENDERNESS IN MOUTH AND THROAT WITH OR WITHOUT PRESENCE OF ULCERS  *URINARY PROBLEMS  *BOWEL PROBLEMS  UNUSUAL RASH Items with * indicate a potential emergency and should be followed up as soon as possible. If you have an emergency after office hours please contact your primary care physician or go to the nearest emergency department.  Please call the clinic during office hours if you have any questions or concerns.   You may also contact the Patient Navigator at [phone] should you have any questions or need assistance in obtaining follow up care.      Resources For Cancer Patients and their Caregivers ? American Cancer Society: Can assist with transportation, wigs, general needs, runs Look Good Feel Better.        (208) 448-5128 ? Cancer Care: Provides financial assistance, online support groups, medication/co-pay assistance.  1-800-813-HOPE 902-023-5193) ? [facility] Assists [place]  cancer patients and their families through emotional , educational and financial support.  4370750964 ? [place] DSS Where to apply for food stamps, Medicaid and utility assistance. (628)704-1791 ? [facility]: Transportation to medical appointments. 256-595-5893 ? Social Security Administration: May apply for disability if have a Stage IV cancer. 848-848-4995 571-491-0811 ? [employer], Disability and Transit Services: Assists with nutrition, care and transit needs. [phone]

## 2020-08-16 NOTE — Progress Notes (Signed)
DISCONTINUE ON PATHWAY REGIMEN - Non-Small Cell Lung     Administer weekly:     Paclitaxel      Carboplatin   **Always confirm dose/schedule in your pharmacy ordering system**  REASON: Continuation Of Treatment PRIOR TREATMENT: [id number]: Carboplatin AUC=2 + Paclitaxel 45 mg/m2 Weekly During Radiation TREATMENT RESPONSE: Partial Response (PR)  START ON PATHWAY REGIMEN - Non-Small Cell Lung     A cycle is every 14 days:     Durvalumab   **Always confirm dose/schedule in your pharmacy ordering system**  Patient Characteristics: Preoperative or Nonsurgical Candidate (Clinical Staging), Stage III - Nonsurgical Candidate (Nonsquamous and Squamous), PS = 0, 1 Therapeutic Status: Preoperative or Nonsurgical Candidate (Clinical Staging) AJCC T Category: cT2b AJCC N Category: cN2 AJCC M Category: cM0 AJCC 8 Stage Grouping: IIIA ECOG Performance Status: 1 Intent of Therapy: Curative Intent, Discussed with Patient

## 2020-08-29 DIAGNOSIS — C3490 Malignant neoplasm of unspecified part of unspecified bronchus or lung: Secondary | ICD-10-CM | POA: Diagnosis not present

## 2020-08-29 DIAGNOSIS — I10 Essential (primary) hypertension: Secondary | ICD-10-CM | POA: Diagnosis not present

## 2020-08-30 ENCOUNTER — Ambulatory Visit (HOSPITAL_COMMUNITY): Payer: Medicare Other

## 2020-08-30 NOTE — Progress Notes (Signed)
Nutrition Follow-up:  Patient with rectal cancer with mets to lung.  Patient has completed chemotherapy and radiation therapy.   Planning to start durvalumab.    Spoke with patient via phone.  Patient reports that his appetite is better and he feels better after completing treatment 2-3 weeks ago.  Reports that he can eat whatever he wants to eat.  Supper last night was spaghetti and meatballs, potato chips and drank a soda.  Lunch yesterday was chicken. Has never ate breakfast.  Has been drinking water but not any shakes (clear, juice shakes).     Medications: reviewed  Labs: reviewed  Anthropometrics:   Weight 157 lb 8 oz on 11/18 improved from 153 lb on 11/11   NUTRITION DIAGNOSIS: Inadequate oral intake improved   INTERVENTION:  Encouraged patient to continue eating high calorie, high protein foods. Patient prefers to call RD if has any more nutrition needs.    NEXT VISIT: no follow-up. RD available if needed  Ruhani Umland B. Zenia Resides, [place], [place] Registered Dietitian [phone] (mobile)

## 2020-09-09 ENCOUNTER — Other Ambulatory Visit: Payer: Self-pay

## 2020-09-09 ENCOUNTER — Other Ambulatory Visit (HOSPITAL_COMMUNITY): Payer: Self-pay

## 2020-09-09 ENCOUNTER — Ambulatory Visit (HOSPITAL_COMMUNITY)
Admission: RE | Admit: 2020-09-09 | Discharge: 2020-09-09 | Disposition: A | Discharge status: Home / Self Care | Payer: Medicare Other | Source: Ambulatory Visit | Attending: Hematology | Admitting: Hematology

## 2020-09-09 ENCOUNTER — Inpatient Hospital Stay (HOSPITAL_COMMUNITY): Payer: Medicare Other | Attending: Hematology and Oncology

## 2020-09-09 DIAGNOSIS — Z5112 Encounter for antineoplastic immunotherapy: Secondary | ICD-10-CM | POA: Insufficient documentation

## 2020-09-09 DIAGNOSIS — I2510 Atherosclerotic heart disease of native coronary artery without angina pectoris: Secondary | ICD-10-CM | POA: Diagnosis not present

## 2020-09-09 DIAGNOSIS — C3491 Malignant neoplasm of unspecified part of right bronchus or lung: Secondary | ICD-10-CM | POA: Insufficient documentation

## 2020-09-09 DIAGNOSIS — F17210 Nicotine dependence, cigarettes, uncomplicated: Secondary | ICD-10-CM | POA: Insufficient documentation

## 2020-09-09 DIAGNOSIS — Z79899 Other long term (current) drug therapy: Secondary | ICD-10-CM | POA: Diagnosis not present

## 2020-09-09 DIAGNOSIS — J439 Emphysema, unspecified: Secondary | ICD-10-CM | POA: Diagnosis not present

## 2020-09-09 DIAGNOSIS — Z85048 Personal history of other malignant neoplasm of rectum, rectosigmoid junction, and anus: Secondary | ICD-10-CM | POA: Diagnosis not present

## 2020-09-09 DIAGNOSIS — C20 Malignant neoplasm of rectum: Secondary | ICD-10-CM

## 2020-09-09 DIAGNOSIS — C3490 Malignant neoplasm of unspecified part of unspecified bronchus or lung: Secondary | ICD-10-CM | POA: Diagnosis not present

## 2020-09-09 LAB — COMPREHENSIVE METABOLIC PANEL
ALT: 11 U/L (ref 0–44)
AST: 15 U/L (ref 15–41)
Albumin: 3.5 g/dL (ref 3.5–5.0)
Alkaline Phosphatase: 62 U/L (ref 38–126)
Anion gap: 10 (ref 5–15)
BUN: 9 mg/dL (ref 6–20)
CO2: 22 mmol/L (ref 22–32)
Calcium: 8.9 mg/dL (ref 8.9–10.3)
Chloride: 104 mmol/L (ref 98–111)
Creatinine, Ser: 1.17 mg/dL (ref 0.61–1.24)
GFR, Estimated: >60 mL/min (ref >60)
Glucose, Bld: 106 mg/dL — ABNORMAL HIGH (ref 70–99)
Potassium: 3.9 mmol/L (ref 3.5–5.1)
Sodium: 136 mmol/L (ref 135–145)
Total Bilirubin: 0.8 mg/dL (ref 0.3–1.2)
Total Protein: 7.5 g/dL (ref 6.5–8.1)

## 2020-09-09 LAB — CBC WITH DIFFERENTIAL/PLATELET
Abs Immature Granulocytes: 0 10*3/uL (ref 0.00–0.07)
Basophils Absolute: 0 10*3/uL (ref 0.0–0.1)
Basophils Relative: 0 %
Eosinophils Absolute: 0.3 10*3/uL (ref 0.0–0.5)
Eosinophils Relative: 5 %
HCT: 43.5 % (ref 39.0–52.0)
Hemoglobin: 14.2 g/dL (ref 13.0–17.0)
Immature Granulocytes: 0 %
Lymphocytes Relative: 15 %
Lymphs Abs: 0.8 10*3/uL (ref 0.7–4.0)
MCH: 34.1 pg — ABNORMAL HIGH (ref 26.0–34.0)
MCHC: 32.6 g/dL (ref 30.0–36.0)
MCV: 104.3 fL — ABNORMAL HIGH (ref 80.0–100.0)
Monocytes Absolute: 0.7 10*3/uL (ref 0.1–1.0)
Monocytes Relative: 14 %
Neutro Abs: 3.4 10*3/uL (ref 1.7–7.7)
Neutrophils Relative %: 66 %
Platelets: 201 10*3/uL (ref 150–400)
RBC: 4.17 MIL/uL — ABNORMAL LOW (ref 4.22–5.81)
RDW: 17.9 % — ABNORMAL HIGH (ref 11.5–15.5)
WBC: 5.2 10*3/uL (ref 4.0–10.5)
nRBC: 0 % (ref 0.0–0.2)

## 2020-09-09 MED ORDER — IOHEXOL 300 MG/ML  SOLN
75.0000 mL | Freq: Once | INTRAMUSCULAR | Status: AC | PRN
  Administered 2020-09-09: 75 mL via INTRAVENOUS

## 2020-09-09 MED ORDER — HYDROCODONE-ACETAMINOPHEN 5-325 MG PO TABS: 1.0000 | ORAL_TABLET | ORAL | 0 refills | Status: DC | PRN

## 2020-09-10 NOTE — Progress Notes (Signed)
.   Pharmacist Chemotherapy Monitoring - Initial Assessment    Anticipated start date: 09/12/20   Regimen:  . Are orders appropriate based on the patient's diagnosis, regimen, and cycle? Yes . Does the plan date match the patient's scheduled date? Yes . Is the sequencing of drugs appropriate? Yes . Are the premedications appropriate for the patient's regimen? Yes . Prior Authorization for treatment is: Approved o If applicable, is the correct biosimilar selected based on the patient's insurance? not applicable  Organ Function and Labs: Marland Kitchen Are dose adjustments needed based on the patient's renal function, hepatic function, or hematologic function? No . Are appropriate labs ordered prior to the start of patient's treatment? Yes . Other organ system assessment, if indicated: N/A . The following baseline labs, if indicated, have been ordered: durvalumab: baseline TSH +/- T4  Dose Assessment: . Are the drug doses appropriate? Yes . Are the following correct: o Drug concentrations Yes o IV fluid compatible with drug Yes o Administration routes Yes o Timing of therapy Yes . If applicable, does the patient have documented access for treatment and/or plans for port-a-cath placement? yes . If applicable, have lifetime cumulative doses been properly documented and assessed? not applicable Lifetime Dose Tracking  . Carboplatin: 930 mg = 0.01 % of the maximum lifetime dose of 999,999,999 mg  o   Toxicity Monitoring/Prevention: . The patient has the following take home antiemetics prescribed: N/A . The patient has the following take home medications prescribed: N/A . Medication allergies and previous infusion related reactions, if applicable, have been reviewed and addressed. Yes . The patient's current medication list has been assessed for drug-drug interactions with their chemotherapy regimen. no significant drug-drug interactions were identified on review.  Order Review: . Are the treatment  plan orders signed? No . Is the patient scheduled to see a provider prior to their treatment? Yes  I verify that I have reviewed each item in the above checklist and answered each question accordingly.  Wynona Neat 09/10/2020 3:09 PM
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
## 2020-09-12 ENCOUNTER — Inpatient Hospital Stay (HOSPITAL_COMMUNITY): Payer: Medicare Other

## 2020-09-12 ENCOUNTER — Inpatient Hospital Stay (HOSPITAL_BASED_OUTPATIENT_CLINIC_OR_DEPARTMENT_OTHER): Payer: Medicare Other | Admitting: Hematology and Oncology

## 2020-09-12 ENCOUNTER — Other Ambulatory Visit: Payer: Self-pay

## 2020-09-12 ENCOUNTER — Encounter (HOSPITAL_COMMUNITY): Payer: Self-pay | Admitting: Hematology and Oncology

## 2020-09-12 VITALS — BP 140/85 | HR 70 | Temp 97.0°F | Resp 18

## 2020-09-12 VITALS — BP 128/82 | HR 96 | Temp 97.0°F | Resp 18 | Wt 162.5 lb

## 2020-09-12 DIAGNOSIS — Z5112 Encounter for antineoplastic immunotherapy: Secondary | ICD-10-CM | POA: Diagnosis not present

## 2020-09-12 DIAGNOSIS — C3491 Malignant neoplasm of unspecified part of right bronchus or lung: Secondary | ICD-10-CM

## 2020-09-12 DIAGNOSIS — F17210 Nicotine dependence, cigarettes, uncomplicated: Secondary | ICD-10-CM | POA: Diagnosis not present

## 2020-09-12 DIAGNOSIS — Z79899 Other long term (current) drug therapy: Secondary | ICD-10-CM | POA: Diagnosis not present

## 2020-09-12 DIAGNOSIS — Z95828 Presence of other vascular implants and grafts: Secondary | ICD-10-CM

## 2020-09-12 LAB — TSH: TSH: 1.189 u[IU]/mL (ref 0.350–4.500)

## 2020-09-12 MED ORDER — HEPARIN SOD (PORK) LOCK FLUSH 100 UNIT/ML IV SOLN
500.0000 [IU] | Freq: Once | INTRAVENOUS | Status: AC | PRN
  Administered 2020-09-12: 500 [IU]

## 2020-09-12 MED ORDER — SODIUM CHLORIDE 0.9 % IV SOLN
10.0000 mg/kg | Freq: Once | INTRAVENOUS | Status: AC
  Administered 2020-09-12: 740 mg via INTRAVENOUS
  Filled 2020-09-12: qty 10

## 2020-09-12 MED ORDER — SODIUM CHLORIDE 0.9 % IV SOLN: Freq: Once | INTRAVENOUS | Status: AC

## 2020-09-12 NOTE — Progress Notes (Signed)
Patients port flushed without difficulty.  Good blood return noted with no bruising or swelling noted at site.  Transparent dressing applied.  Patient left access for chemotherapy treatment.

## 2020-09-12 NOTE — Patient Instructions (Signed)
Durvalumab        (dur-VAL-ue-mab)  Trade Name(s): Imfinzi  You will receive durvalumab (Imfinzi) through your port a cath once every two weeks. You will be monitored for an infusion reaction which is a rare occurrence.  Side Effects  Important things to remember about the side effects of durvalumab:   Most people will not experience all of the durvalumab side effects listed.  Side effects are often predictable in terms of their onset, duration, and severity.  Side effects are almost always reversible and will go away after therapy is complete.  Side effects may be quite manageable. There are many options to minimize or prevent side effects of durvalumab.  The following side effects are common (occurring in greater than 30%) for patients taking durvalumab:   Fatigue  Infection  These are less common side effects (occurring in 10-29%) for patients receiving durvalumab:   Muscle and/or bone pain  Constipation  Decreased appetite  Rash  Nausea  Swelling  Urinary tract infections  Abdominal pain  Fever  Colitis  Diarrhea  Decreased sodium level  Decreased lymphocyte count (a type of white blood cell)  The following are rare but serious complications of durvalumab therapy triggered by an auto-immune reaction where the immune system goes after normal cells in the body. This can happen at any time while taking, and/or after stopping durvalumab. Contact your healthcare provider immediately if you have signs/symptoms of the following:  Pneumonitis (lung problems) identified by:    New or worsening cough   Shortness of breath   Chest pain  Hepatitis (liver problems) identified by:   Yellowing of your skin or the whites of your eyes  Severe nausea and vomiting  Pain on the right side of your stomach  Dark, tea colored urine  Bleeding or bruising more easily than normal  Colitis (intestinal problems) identified by:   Diarrhea  Blood in your  stools or dark, tarry stool  Severe stomach pain or tenderness  Hormone gland problems (thyroid gland, adrenal gland, and pancreas) identified by:   Rapid heart beat  Increased sweating  Extreme tiredness  Unexpected weight gain or loss  Feeling more hungry or thirsty  High blood sugar  Hair loss  Irritability or forgetfulness  Constipation  Deepening of your voice  Low blood pressure  More frequent urination  Stomach pain  Kidney problems identified by:  Less frequent urination  Blood in your urine  Ankle swelling  Loss of appetite  Other organ problems:   Headache, change in balance, confusion  Severe muscle weakness or pain  Chest pain and tightness  Trouble breathing  Skin rash  Change in heartbeat  Flu like symptoms  Not all side effects are listed above. Side effects that are very rare -- occurring in less than about 10 percent of patients -- are not listed here. But you should always inform your health care provider if you experience any unusual symptoms.  When to Contact Your Doctor or [place] Provider  Contact your health care provider immediately, day or night, if you should experience any of the following symptoms:   Fever of 100.4 F (38 C) or higher, chills (possible signs of infection)  Shortness of breath, cough  Confusion, imbalance  The following symptoms require medical attention, but are not an emergency. Contact your health care provider within 24 hours of noticing any of the following:   Nausea (interferes with ability to eat and unrelieved with prescribed medication)  Vomiting (vomiting more than  4-5 times in a 24-hour period)  Diarrhea (4-6 episodes in a 24-hour period)  Unusual bleeding or bruising  Black or tarry stools, or blood in your stools  Blood in the urine  Pain or burning with urination  Extreme fatigue (unable to carry on self-care activities)  Yellowing of skin or eyes  Constipation  unrelieved by laxatives use.  Always inform your health care provider if you experience any unusual symptoms.  Precautions  Before starting durvalumab treatment, make sure you tell your doctor about any other medications you are taking (including prescription, over-the-counter, vitamins, herbal remedies, etc.).  Do not receive any kind of immunization or vaccination without your doctor's approval while taking durvalumab. Inform your health care professional if you are pregnant or may be pregnant prior to starting this treatment.   Pregnancy category X (Durvalumab may cause fetal harm when given to a pregnant woman. This drug must not be given to a pregnant woman or a woman who intends to become pregnant. If a woman becomes pregnant while taking durvalumab, the medication must be stopped immediately and the woman given appropriate counseling.  For both men and women: Use contraceptives, and do not conceive a child (get pregnant) while taking durvalumab. Barrier methods of contraception, such as condoms, are recommended for up to 3 months after last dose of durvalumab.  Do not breast feed while taking durvalumab or for at least 3 months after last dose of durvalumab.  Self-Care Tips   Drink at least two to three quarts of fluid every 24 hours, unless you are instructed otherwise.  You may be at risk of infection so try to avoid crowds or people with colds, and report fever or any other signs of infection immediately to your health care provider.  Wash your hands often.  To reduce nausea, take anti-nausea medication as prescribed by your doctor, and eat small, frequent meals.  Follow regimen of anti-diarrhea medication as prescribed by your health care professional.  Eat foods that may help reduce diarrhea (see managing side effects - diarrhea).  In general, drinking alcoholic beverages should be kept to a minimum or avoided completely. You should discuss this with your doctor.  Get  plenty of rest.  Maintain good nutrition.  Remain active as you are able. Gentle exercise is encouraged such as a daily walk.  If you experience symptoms or side effects, be sure to discuss them with your health care team. They can prescribe medications and/or offer other suggestions that are effective in managing such problems.  Monitoring and Testing While Taking Durvalumab  You will be checked regularly by your doctor while you are taking durvalumab to monitor side effects and check your response to therapy. Periodic blood work will be obtained to monitor your complete blood count (CBC), glucose, as well as the function of other organs (such as your kidneys, liver, thyroid) will also be ordered by your doctor.

## 2020-09-12 NOTE — Progress Notes (Signed)
Patient has been examined, vital signs and labs have been reviewed by Dr. Chryl Heck. ANC, Creatinine, LFTs, hemoglobin, and platelets are within treatment parameters per Dr. Chryl Heck. Patient is okay to proceed with treatment per M.D.   Tolerated Imfinzi infusion w/o adverse reaction.  Alert, in no distress.  VSS.  Discharged ambulatory in stable condition.

## 2020-09-13 LAB — T4: T4, Total: 6.9 ug/dL (ref 4.5–12.0)

## 2020-09-18 DIAGNOSIS — Z4801 Encounter for change or removal of surgical wound dressing: Secondary | ICD-10-CM | POA: Diagnosis not present

## 2020-09-18 DIAGNOSIS — C20 Malignant neoplasm of rectum: Secondary | ICD-10-CM | POA: Diagnosis not present

## 2020-09-18 DIAGNOSIS — S31829A Unspecified open wound of left buttock, initial encounter: Secondary | ICD-10-CM | POA: Diagnosis not present

## 2020-09-18 DIAGNOSIS — Z933 Colostomy status: Secondary | ICD-10-CM | POA: Diagnosis not present

## 2020-09-25 ENCOUNTER — Inpatient Hospital Stay (HOSPITAL_COMMUNITY): Payer: Medicare Other

## 2020-09-25 ENCOUNTER — Other Ambulatory Visit: Payer: Self-pay

## 2020-09-25 ENCOUNTER — Inpatient Hospital Stay (HOSPITAL_BASED_OUTPATIENT_CLINIC_OR_DEPARTMENT_OTHER): Payer: Medicare Other | Admitting: Hematology

## 2020-09-25 VITALS — BP 102/65 | HR 45 | Temp 96.8°F | Resp 18 | Wt 163.8 lb

## 2020-09-25 VITALS — BP 103/66 | HR 93 | Temp 97.5°F | Resp 18

## 2020-09-25 DIAGNOSIS — C3491 Malignant neoplasm of unspecified part of right bronchus or lung: Secondary | ICD-10-CM

## 2020-09-25 DIAGNOSIS — C20 Malignant neoplasm of rectum: Secondary | ICD-10-CM

## 2020-09-25 DIAGNOSIS — Z95828 Presence of other vascular implants and grafts: Secondary | ICD-10-CM

## 2020-09-25 DIAGNOSIS — F17210 Nicotine dependence, cigarettes, uncomplicated: Secondary | ICD-10-CM | POA: Diagnosis not present

## 2020-09-25 DIAGNOSIS — Z5112 Encounter for antineoplastic immunotherapy: Secondary | ICD-10-CM | POA: Diagnosis not present

## 2020-09-25 DIAGNOSIS — Z79899 Other long term (current) drug therapy: Secondary | ICD-10-CM | POA: Diagnosis not present

## 2020-09-25 LAB — CBC WITH DIFFERENTIAL/PLATELET
Abs Immature Granulocytes: 0.01 10*3/uL (ref 0.00–0.07)
Basophils Absolute: 0 10*3/uL (ref 0.0–0.1)
Basophils Relative: 1 %
Eosinophils Absolute: 0.2 10*3/uL (ref 0.0–0.5)
Eosinophils Relative: 4 %
HCT: 42.3 % (ref 39.0–52.0)
Hemoglobin: 13.8 g/dL (ref 13.0–17.0)
Immature Granulocytes: 0 %
Lymphocytes Relative: 12 %
Lymphs Abs: 0.7 10*3/uL (ref 0.7–4.0)
MCH: 35.4 pg — ABNORMAL HIGH (ref 26.0–34.0)
MCHC: 32.6 g/dL (ref 30.0–36.0)
MCV: 108.5 fL — ABNORMAL HIGH (ref 80.0–100.0)
Monocytes Absolute: 0.7 10*3/uL (ref 0.1–1.0)
Monocytes Relative: 11 %
Neutro Abs: 4.4 10*3/uL (ref 1.7–7.7)
Neutrophils Relative %: 72 %
Platelets: 168 10*3/uL (ref 150–400)
RBC: 3.9 MIL/uL — ABNORMAL LOW (ref 4.22–5.81)
RDW: 20.3 % — ABNORMAL HIGH (ref 11.5–15.5)
WBC: 6 10*3/uL (ref 4.0–10.5)
nRBC: 0 % (ref 0.0–0.2)

## 2020-09-25 LAB — COMPREHENSIVE METABOLIC PANEL
ALT: 9 U/L (ref 0–44)
AST: 14 U/L — ABNORMAL LOW (ref 15–41)
Albumin: 3.3 g/dL — ABNORMAL LOW (ref 3.5–5.0)
Alkaline Phosphatase: 61 U/L (ref 38–126)
Anion gap: 9 (ref 5–15)
BUN: 9 mg/dL (ref 6–20)
CO2: 23 mmol/L (ref 22–32)
Calcium: 8.5 mg/dL — ABNORMAL LOW (ref 8.9–10.3)
Chloride: 107 mmol/L (ref 98–111)
Creatinine, Ser: 0.99 mg/dL (ref 0.61–1.24)
GFR, Estimated: >60 mL/min (ref >60)
Glucose, Bld: 105 mg/dL — ABNORMAL HIGH (ref 70–99)
Potassium: 4 mmol/L (ref 3.5–5.1)
Sodium: 139 mmol/L (ref 135–145)
Total Bilirubin: 1 mg/dL (ref 0.3–1.2)
Total Protein: 7.1 g/dL (ref 6.5–8.1)

## 2020-09-25 MED ORDER — SODIUM CHLORIDE 0.9% FLUSH
10.0000 mL | INTRAVENOUS | Status: DC | PRN
  Administered 2020-09-25 (×2): 10 mL

## 2020-09-25 MED ORDER — HEPARIN SOD (PORK) LOCK FLUSH 100 UNIT/ML IV SOLN
500.0000 [IU] | Freq: Once | INTRAVENOUS | Status: AC | PRN
  Administered 2020-09-25: 500 [IU]

## 2020-09-25 MED ORDER — SODIUM CHLORIDE 0.9 % IV SOLN: Freq: Once | INTRAVENOUS | Status: AC

## 2020-09-25 MED ORDER — SODIUM CHLORIDE 0.9 % IV SOLN
10.0000 mg/kg | Freq: Once | INTRAVENOUS | Status: AC
  Administered 2020-09-25: 740 mg via INTRAVENOUS
  Filled 2020-09-25: qty 4.8
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
## 2020-09-25 NOTE — Patient Instructions (Signed)
[facility] at [facility] Discharge Instructions  Labs drawn from portacath today   Thank you for choosing [facility] at [facility] to provide your oncology and hematology care.  To afford each patient quality time with our provider, please arrive at least 15 minutes before your scheduled appointment time.   If you have a lab appointment with the Cancer Center please come in thru the Main Entrance and check in at the main information desk.  You need to re-schedule your appointment should you arrive 10 or more minutes late.  We strive to give you quality time with our providers, and arriving late affects you and other patients whose appointments are after yours.  Also, if you no show three or more times for appointments you may be dismissed from the clinic at the providers discretion.     Again, thank you for choosing [facility].  Our hope is that these requests will decrease the amount of time that you wait before being seen by our physicians.       _____________________________________________________________  Should you have questions after your visit to [facility], please contact our office at [phone] and follow the prompts.  Our office hours are 8:00 a.m. and 4:30 p.m. Monday - Friday.  Please note that voicemails left after 4:00 p.m. may not be returned until the following business day.  We are closed weekends and major holidays.  You do have access to a nurse 24-7, just call the main number to the clinic [phone] and do not press any options, hold on the line and a nurse will answer the phone.    For prescription refill requests, have your pharmacy contact our office and allow 72 hours.    Due to Covid, you will need to wear a mask upon entering the hospital. If you do not have a mask, a mask will be given to you at the Main Entrance upon arrival. For doctor visits, patients may have 1 support person age 18  or older with them. For treatment visits, patients can not have anyone with them due to social distancing guidelines and our immunocompromised population.     

## 2020-09-25 NOTE — Patient Instructions (Signed)
[place] at [facility] Discharge Instructions  You were seen today by Dr. Delton Coombes. He went over your recent results. You received your treatment today; continue getting your treatment every 2 weeks. Dr. Delton Coombes will see you back in 4 weeks for labs and follow up.   Thank you for choosing [place] at [facility] to provide your oncology and hematology care.  To afford each patient quality time with our provider, please arrive at least 15 minutes before your scheduled appointment time.   If you have a lab appointment with the [facility] please come in thru the Main Entrance and check in at the main information desk  You need to re-schedule your appointment should you arrive 10 or more minutes late.  We strive to give you quality time with our providers, and arriving late affects you and other patients whose appointments are after yours.  Also, if you no show three or more times for appointments you may be dismissed from the clinic at the providers discretion.     Again, thank you for choosing [facility].  Our hope is that these requests will decrease the amount of time that you wait before being seen by our physicians.       _____________________________________________________________  Should you have questions after your visit to [facility], please contact our office at [phone] between the hours of 8:00 a.m. and 4:30 p.m.  Voicemails left after 4:00 p.m. will not be returned until the following business day.  For prescription refill requests, have your pharmacy contact our office and allow 72 hours.    Cancer Center Support Programs:   > Cancer Support Group  2nd Tuesday of the month 1pm-2pm, Journey Room

## 2020-09-25 NOTE — Patient Instructions (Signed)
[facility] Discharge Instructions for Patients Receiving Chemotherapy   Beginning January 23rd 2017 lab work for the [facility] will be done in the  Main lab at [facility] on 1st floor. If you have a lab appointment with the [facility] please come in thru the  Main Entrance and check in at the main information desk   Today you received the following chemotherapy agents Imfinzi. Follow-up as scheduled  To help prevent nausea and vomiting after your treatment, we encourage you to take your nausea medication   If you develop nausea and vomiting, or diarrhea that is not controlled by your medication, call the clinic.  The clinic phone number is [phone]. Office hours are Monday-Friday 8:30am-5:00pm.  BELOW ARE SYMPTOMS THAT SHOULD BE REPORTED IMMEDIATELY:  *FEVER GREATER THAN 101.0 F  *CHILLS WITH OR WITHOUT FEVER  NAUSEA AND VOMITING THAT IS NOT CONTROLLED WITH YOUR NAUSEA MEDICATION  *UNUSUAL SHORTNESS OF BREATH  *UNUSUAL BRUISING OR BLEEDING  TENDERNESS IN MOUTH AND THROAT WITH OR WITHOUT PRESENCE OF ULCERS  *URINARY PROBLEMS  *BOWEL PROBLEMS  UNUSUAL RASH Items with * indicate a potential emergency and should be followed up as soon as possible. If you have an emergency after office hours please contact your primary care physician or go to the nearest emergency department.  Please call the clinic during office hours if you have any questions or concerns.   You may also contact the Patient Navigator at [phone] should you have any questions or need assistance in obtaining follow up care.      Resources For Cancer Patients and their Caregivers ? American Cancer Society: Can assist with transportation, wigs, general needs, runs Look Good Feel Better.        (725)254-0244 ? Cancer Care: Provides financial assistance, online support groups, medication/co-pay assistance.  1-800-813-HOPE 502-208-1835) ? [facility] Assists [place] cancer patients and their families through emotional , educational and financial support.  934-140-6609 ? [place] DSS Where to apply for food stamps, Medicaid and utility assistance. (872)150-9603 ? [facility]: Transportation to medical appointments. 803-558-1248 ? Social Security Administration: May apply for disability if have a Stage IV cancer. 218-334-3296 343 352 8360 ? [employer], Disability and Transit Services: Assists with nutrition, care and transit needs. 629-651-0670

## 2020-09-25 NOTE — Progress Notes (Signed)
0913 Labs reviewed with and pt seen by Dr. Delton Coombes and pt approved for Imfinzi infusion today per MD                                     Camila Li tolerated Imfinzi infusion well without complaints or incident. VSS upon discharge. Pt discharged self ambulatory in satisfactory condition

## 2020-09-26 ENCOUNTER — Ambulatory Visit (HOSPITAL_COMMUNITY): Payer: Medicare Other | Admitting: Hematology

## 2020-09-26 ENCOUNTER — Other Ambulatory Visit (HOSPITAL_COMMUNITY): Payer: Medicare Other

## 2020-09-29 DIAGNOSIS — I2510 Atherosclerotic heart disease of native coronary artery without angina pectoris: Secondary | ICD-10-CM | POA: Diagnosis not present

## 2020-09-29 DIAGNOSIS — I10 Essential (primary) hypertension: Secondary | ICD-10-CM | POA: Diagnosis not present

## 2020-10-01 DIAGNOSIS — G8929 Other chronic pain: Secondary | ICD-10-CM | POA: Diagnosis not present

## 2020-10-01 DIAGNOSIS — M549 Dorsalgia, unspecified: Secondary | ICD-10-CM | POA: Diagnosis not present

## 2020-10-01 DIAGNOSIS — R5383 Other fatigue: Secondary | ICD-10-CM | POA: Diagnosis not present

## 2020-10-01 DIAGNOSIS — Z85048 Personal history of other malignant neoplasm of rectum, rectosigmoid junction, and anus: Secondary | ICD-10-CM | POA: Diagnosis not present

## 2020-10-01 DIAGNOSIS — Z923 Personal history of irradiation: Secondary | ICD-10-CM | POA: Diagnosis not present

## 2020-10-01 DIAGNOSIS — C3490 Malignant neoplasm of unspecified part of unspecified bronchus or lung: Secondary | ICD-10-CM | POA: Diagnosis not present

## 2020-10-01 DIAGNOSIS — Z79899 Other long term (current) drug therapy: Secondary | ICD-10-CM | POA: Diagnosis not present

## 2020-10-01 DIAGNOSIS — Z9221 Personal history of antineoplastic chemotherapy: Secondary | ICD-10-CM | POA: Diagnosis not present

## 2020-10-07 ENCOUNTER — Other Ambulatory Visit (HOSPITAL_COMMUNITY): Payer: Self-pay

## 2020-10-07 DIAGNOSIS — C3491 Malignant neoplasm of unspecified part of right bronchus or lung: Secondary | ICD-10-CM

## 2020-10-07 MED ORDER — HYDROCODONE-ACETAMINOPHEN 5-325 MG PO TABS: 1.0000 | ORAL_TABLET | ORAL | 0 refills | Status: DC | PRN

## 2020-10-09 ENCOUNTER — Inpatient Hospital Stay (HOSPITAL_COMMUNITY): Payer: Medicare Other | Attending: Hematology

## 2020-10-09 ENCOUNTER — Other Ambulatory Visit: Payer: Self-pay

## 2020-10-09 ENCOUNTER — Inpatient Hospital Stay (HOSPITAL_COMMUNITY): Payer: Medicare Other

## 2020-10-09 ENCOUNTER — Encounter (HOSPITAL_COMMUNITY): Payer: Self-pay

## 2020-10-09 VITALS — BP 108/76 | HR 98 | Temp 97.2°F | Resp 18

## 2020-10-09 DIAGNOSIS — C3431 Malignant neoplasm of lower lobe, right bronchus or lung: Secondary | ICD-10-CM | POA: Diagnosis not present

## 2020-10-09 DIAGNOSIS — F17210 Nicotine dependence, cigarettes, uncomplicated: Secondary | ICD-10-CM | POA: Diagnosis not present

## 2020-10-09 DIAGNOSIS — C20 Malignant neoplasm of rectum: Secondary | ICD-10-CM

## 2020-10-09 DIAGNOSIS — Z5112 Encounter for antineoplastic immunotherapy: Secondary | ICD-10-CM | POA: Diagnosis not present

## 2020-10-09 DIAGNOSIS — C3491 Malignant neoplasm of unspecified part of right bronchus or lung: Secondary | ICD-10-CM

## 2020-10-09 DIAGNOSIS — Z95828 Presence of other vascular implants and grafts: Secondary | ICD-10-CM

## 2020-10-09 DIAGNOSIS — Z79899 Other long term (current) drug therapy: Secondary | ICD-10-CM | POA: Insufficient documentation

## 2020-10-09 LAB — COMPREHENSIVE METABOLIC PANEL
ALT: 15 U/L (ref 0–44)
AST: 20 U/L (ref 15–41)
Albumin: 3.5 g/dL (ref 3.5–5.0)
Alkaline Phosphatase: 56 U/L (ref 38–126)
Anion gap: 9 (ref 5–15)
BUN: 10 mg/dL (ref 6–20)
CO2: 22 mmol/L (ref 22–32)
Calcium: 8.4 mg/dL — ABNORMAL LOW (ref 8.9–10.3)
Chloride: 106 mmol/L (ref 98–111)
Creatinine, Ser: 1.08 mg/dL (ref 0.61–1.24)
GFR, Estimated: >60 mL/min (ref >60)
Glucose, Bld: 97 mg/dL (ref 70–99)
Potassium: 4.2 mmol/L (ref 3.5–5.1)
Sodium: 137 mmol/L (ref 135–145)
Total Bilirubin: 1.4 mg/dL — ABNORMAL HIGH (ref 0.3–1.2)
Total Protein: 7.5 g/dL (ref 6.5–8.1)

## 2020-10-09 LAB — CBC WITH DIFFERENTIAL/PLATELET
Abs Immature Granulocytes: 0.01 10*3/uL (ref 0.00–0.07)
Basophils Absolute: 0 10*3/uL (ref 0.0–0.1)
Basophils Relative: 0 %
Eosinophils Absolute: 0.1 10*3/uL (ref 0.0–0.5)
Eosinophils Relative: 3 %
HCT: 46.4 % (ref 39.0–52.0)
Hemoglobin: 15.3 g/dL (ref 13.0–17.0)
Immature Granulocytes: 0 %
Lymphocytes Relative: 13 %
Lymphs Abs: 0.6 10*3/uL — ABNORMAL LOW (ref 0.7–4.0)
MCH: 36 pg — ABNORMAL HIGH (ref 26.0–34.0)
MCHC: 33 g/dL (ref 30.0–36.0)
MCV: 109.2 fL — ABNORMAL HIGH (ref 80.0–100.0)
Monocytes Absolute: 0.5 10*3/uL (ref 0.1–1.0)
Monocytes Relative: 11 %
Neutro Abs: 3.4 10*3/uL (ref 1.7–7.7)
Neutrophils Relative %: 73 %
Platelets: 110 10*3/uL — ABNORMAL LOW (ref 150–400)
RBC: 4.25 MIL/uL (ref 4.22–5.81)
RDW: 19.4 % — ABNORMAL HIGH (ref 11.5–15.5)
WBC: 4.6 10*3/uL (ref 4.0–10.5)
nRBC: 0 % (ref 0.0–0.2)

## 2020-10-09 LAB — TSH: TSH: 1.211 u[IU]/mL (ref 0.350–4.500)

## 2020-10-09 MED ORDER — HEPARIN SOD (PORK) LOCK FLUSH 100 UNIT/ML IV SOLN
500.0000 [IU] | Freq: Once | INTRAVENOUS | Status: AC | PRN
  Administered 2020-10-09: 500 [IU]

## 2020-10-09 MED ORDER — SODIUM CHLORIDE 0.9% FLUSH
10.0000 mL | INTRAVENOUS | Status: DC | PRN
Start: 2020-10-09 — End: 2020-10-09
  Administered 2020-10-09: 10 mL

## 2020-10-09 MED ORDER — SODIUM CHLORIDE 0.9 % IV SOLN: Freq: Once | INTRAVENOUS | Status: AC

## 2020-10-09 MED ORDER — SODIUM CHLORIDE 0.9 % IV SOLN
10.0000 mg/kg | Freq: Once | INTRAVENOUS | Status: AC
  Administered 2020-10-09: 740 mg via INTRAVENOUS
  Filled 2020-10-09: qty 4.8

## 2020-10-09 NOTE — Progress Notes (Signed)
Patient tolerated therapy with no complaints voiced.  Side effects with management reviewed with understanding verbalized.  Port site clean and dry with no bruising or swelling noted at site.  Good blood return noted before and after administration of therapy.  Band aid applied.  Patient left in satisfactory condition with VSS and no s/s of distress noted.

## 2020-10-22 DIAGNOSIS — Z933 Colostomy status: Secondary | ICD-10-CM | POA: Diagnosis not present

## 2020-10-22 DIAGNOSIS — S31829A Unspecified open wound of left buttock, initial encounter: Secondary | ICD-10-CM | POA: Diagnosis not present

## 2020-10-22 DIAGNOSIS — Z4801 Encounter for change or removal of surgical wound dressing: Secondary | ICD-10-CM | POA: Diagnosis not present

## 2020-10-22 DIAGNOSIS — C20 Malignant neoplasm of rectum: Secondary | ICD-10-CM | POA: Diagnosis not present

## 2020-10-23 ENCOUNTER — Inpatient Hospital Stay (HOSPITAL_COMMUNITY): Payer: Medicare Other

## 2020-10-23 ENCOUNTER — Other Ambulatory Visit: Payer: Self-pay

## 2020-10-23 ENCOUNTER — Inpatient Hospital Stay (HOSPITAL_BASED_OUTPATIENT_CLINIC_OR_DEPARTMENT_OTHER): Payer: Medicare Other | Admitting: Hematology

## 2020-10-23 VITALS — BP 136/95 | HR 63 | Temp 97.0°F | Resp 17 | Wt 159.0 lb

## 2020-10-23 VITALS — BP 132/82 | HR 63 | Temp 97.7°F | Resp 18

## 2020-10-23 DIAGNOSIS — Z79899 Other long term (current) drug therapy: Secondary | ICD-10-CM | POA: Diagnosis not present

## 2020-10-23 DIAGNOSIS — Z95828 Presence of other vascular implants and grafts: Secondary | ICD-10-CM

## 2020-10-23 DIAGNOSIS — Z5112 Encounter for antineoplastic immunotherapy: Secondary | ICD-10-CM | POA: Diagnosis not present

## 2020-10-23 DIAGNOSIS — C3491 Malignant neoplasm of unspecified part of right bronchus or lung: Secondary | ICD-10-CM

## 2020-10-23 DIAGNOSIS — C3431 Malignant neoplasm of lower lobe, right bronchus or lung: Secondary | ICD-10-CM | POA: Diagnosis not present

## 2020-10-23 DIAGNOSIS — C20 Malignant neoplasm of rectum: Secondary | ICD-10-CM

## 2020-10-23 DIAGNOSIS — F17210 Nicotine dependence, cigarettes, uncomplicated: Secondary | ICD-10-CM | POA: Diagnosis not present

## 2020-10-23 LAB — CBC WITH DIFFERENTIAL/PLATELET
Abs Immature Granulocytes: 0.02 10*3/uL (ref 0.00–0.07)
Basophils Absolute: 0 10*3/uL (ref 0.0–0.1)
Basophils Relative: 0 %
Eosinophils Absolute: 0.1 10*3/uL (ref 0.0–0.5)
Eosinophils Relative: 2 %
HCT: 46.2 % (ref 39.0–52.0)
Hemoglobin: 15.3 g/dL (ref 13.0–17.0)
Immature Granulocytes: 0 %
Lymphocytes Relative: 14 %
Lymphs Abs: 0.8 10*3/uL (ref 0.7–4.0)
MCH: 36.6 pg — ABNORMAL HIGH (ref 26.0–34.0)
MCHC: 33.1 g/dL (ref 30.0–36.0)
MCV: 110.5 fL — ABNORMAL HIGH (ref 80.0–100.0)
Monocytes Absolute: 0.6 10*3/uL (ref 0.1–1.0)
Monocytes Relative: 12 %
Neutro Abs: 3.9 10*3/uL (ref 1.7–7.7)
Neutrophils Relative %: 72 %
Platelets: 162 10*3/uL (ref 150–400)
RBC: 4.18 MIL/uL — ABNORMAL LOW (ref 4.22–5.81)
RDW: 15.9 % — ABNORMAL HIGH (ref 11.5–15.5)
WBC: 5.5 10*3/uL (ref 4.0–10.5)
nRBC: 0 % (ref 0.0–0.2)

## 2020-10-23 LAB — COMPREHENSIVE METABOLIC PANEL
ALT: 15 U/L (ref 0–44)
AST: 19 U/L (ref 15–41)
Albumin: 3.2 g/dL — ABNORMAL LOW (ref 3.5–5.0)
Alkaline Phosphatase: 58 U/L (ref 38–126)
Anion gap: 11 (ref 5–15)
BUN: 10 mg/dL (ref 6–20)
CO2: 25 mmol/L (ref 22–32)
Calcium: 9.2 mg/dL (ref 8.9–10.3)
Chloride: 104 mmol/L (ref 98–111)
Creatinine, Ser: 1.23 mg/dL (ref 0.61–1.24)
GFR, Estimated: >60 mL/min (ref >60)
Glucose, Bld: 107 mg/dL — ABNORMAL HIGH (ref 70–99)
Potassium: 3.9 mmol/L (ref 3.5–5.1)
Sodium: 140 mmol/L (ref 135–145)
Total Bilirubin: 1.2 mg/dL (ref 0.3–1.2)
Total Protein: 7.1 g/dL (ref 6.5–8.1)

## 2020-10-23 MED ORDER — HYDROCODONE-ACETAMINOPHEN 10-325 MG PO TABS: 1.0000 | ORAL_TABLET | ORAL | 0 refills | Status: DC | PRN

## 2020-10-23 MED ORDER — HEPARIN SOD (PORK) LOCK FLUSH 100 UNIT/ML IV SOLN
500.0000 [IU] | Freq: Once | INTRAVENOUS | Status: AC | PRN
  Administered 2020-10-23: 500 [IU]

## 2020-10-23 MED ORDER — SODIUM CHLORIDE 0.9% FLUSH
10.0000 mL | INTRAVENOUS | Status: DC | PRN
  Administered 2020-10-23: 10 mL

## 2020-10-23 MED ORDER — SODIUM CHLORIDE 0.9 % IV SOLN
10.0000 mg/kg | Freq: Once | INTRAVENOUS | Status: AC
  Administered 2020-10-23: 740 mg via INTRAVENOUS
  Filled 2020-10-23: qty 10

## 2020-10-23 MED ORDER — SODIUM CHLORIDE 0.9 % IV SOLN: Freq: Once | INTRAVENOUS | Status: AC

## 2020-10-23 NOTE — Patient Instructions (Signed)
[place] at [facility] Discharge Instructions  You were seen today by Dr. Delton Coombes. He went over your recent results. You received your treatment today; continue getting your treatment every 2 weeks. Your Norco dose will be increased to 10-325 to take every 4 hours. Purchase Mucinex over the counter to help with your cough. You will be scheduled for a CT scan of your chest before your next visit. Dr. Delton Coombes will see you back in 4 weeks for labs and follow up.   Thank you for choosing [place] at [facility] to provide your oncology and hematology care.  To afford each patient quality time with our provider, please arrive at least 15 minutes before your scheduled appointment time.   If you have a lab appointment with the [place] please come in thru the Main Entrance and check in at the main information desk  You need to re-schedule your appointment should you arrive 10 or more minutes late.  We strive to give you quality time with our providers, and arriving late affects you and other patients whose appointments are after yours.  Also, if you no show three or more times for appointments you may be dismissed from the clinic at the providers discretion.     Again, thank you for choosing [facility].  Our hope is that these requests will decrease the amount of time that you wait before being seen by our physicians.       _____________________________________________________________  Should you have questions after your visit to [facility], please contact our office at [phone] between the hours of 8:00 a.m. and 4:30 p.m.  Voicemails left after 4:00 p.m. will not be returned until the following business day.  For prescription refill requests, have your pharmacy contact our office and allow 72 hours.    Cancer Center Support Programs:   > Cancer Support Group  2nd Tuesday of the month 1pm-2pm, Journey Room

## 2020-10-23 NOTE — Progress Notes (Signed)
Patient tolerated therapy with no complaints voiced.  Side effects with management reviewed with understanding verbalized.  Port site clean and dry with no bruising or swelling noted at site.  Good blood return noted before and after administration of therapy.  Band aid applied.  Patient left in satisfactory condition with VSS and no s/s of distress noted.
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
## 2020-10-23 NOTE — Progress Notes (Signed)
Patient was assessed by Dr. Katragadda and labs have been reviewed.  Patient is okay to proceed with treatment today. Primary RN and pharmacy aware.   

## 2020-10-30 DIAGNOSIS — I82401 Acute embolism and thrombosis of unspecified deep veins of right lower extremity: Secondary | ICD-10-CM | POA: Diagnosis not present

## 2020-10-30 DIAGNOSIS — I10 Essential (primary) hypertension: Secondary | ICD-10-CM | POA: Diagnosis not present

## 2020-11-05 DIAGNOSIS — Z933 Colostomy status: Secondary | ICD-10-CM | POA: Diagnosis not present

## 2020-11-05 DIAGNOSIS — C20 Malignant neoplasm of rectum: Secondary | ICD-10-CM | POA: Diagnosis not present

## 2020-11-05 DIAGNOSIS — Z4801 Encounter for change or removal of surgical wound dressing: Secondary | ICD-10-CM | POA: Diagnosis not present

## 2020-11-05 DIAGNOSIS — S31829A Unspecified open wound of left buttock, initial encounter: Secondary | ICD-10-CM | POA: Diagnosis not present

## 2020-11-06 ENCOUNTER — Inpatient Hospital Stay (HOSPITAL_COMMUNITY): Payer: Medicare Other

## 2020-11-06 ENCOUNTER — Inpatient Hospital Stay (HOSPITAL_COMMUNITY): Payer: Medicare Other | Attending: Hematology

## 2020-11-06 ENCOUNTER — Other Ambulatory Visit: Payer: Self-pay

## 2020-11-06 VITALS — BP 105/69 | HR 99 | Temp 97.5°F | Resp 18

## 2020-11-06 VITALS — BP 131/73 | HR 98 | Temp 97.5°F | Resp 18 | Wt 159.4 lb

## 2020-11-06 DIAGNOSIS — Z95828 Presence of other vascular implants and grafts: Secondary | ICD-10-CM

## 2020-11-06 DIAGNOSIS — S31829A Unspecified open wound of left buttock, initial encounter: Secondary | ICD-10-CM | POA: Diagnosis not present

## 2020-11-06 DIAGNOSIS — C20 Malignant neoplasm of rectum: Secondary | ICD-10-CM

## 2020-11-06 DIAGNOSIS — Z79899 Other long term (current) drug therapy: Secondary | ICD-10-CM | POA: Diagnosis not present

## 2020-11-06 DIAGNOSIS — C3491 Malignant neoplasm of unspecified part of right bronchus or lung: Secondary | ICD-10-CM

## 2020-11-06 DIAGNOSIS — Z5112 Encounter for antineoplastic immunotherapy: Secondary | ICD-10-CM | POA: Insufficient documentation

## 2020-11-06 DIAGNOSIS — C3431 Malignant neoplasm of lower lobe, right bronchus or lung: Secondary | ICD-10-CM | POA: Diagnosis not present

## 2020-11-06 DIAGNOSIS — Z4801 Encounter for change or removal of surgical wound dressing: Secondary | ICD-10-CM | POA: Diagnosis not present

## 2020-11-06 DIAGNOSIS — F17210 Nicotine dependence, cigarettes, uncomplicated: Secondary | ICD-10-CM | POA: Diagnosis not present

## 2020-11-06 DIAGNOSIS — Z933 Colostomy status: Secondary | ICD-10-CM | POA: Diagnosis not present

## 2020-11-06 LAB — COMPREHENSIVE METABOLIC PANEL
ALT: 10 U/L (ref 0–44)
AST: 14 U/L — ABNORMAL LOW (ref 15–41)
Albumin: 3.2 g/dL — ABNORMAL LOW (ref 3.5–5.0)
Alkaline Phosphatase: 63 U/L (ref 38–126)
Anion gap: 7 (ref 5–15)
BUN: 6 mg/dL (ref 6–20)
CO2: 26 mmol/L (ref 22–32)
Calcium: 8.7 mg/dL — ABNORMAL LOW (ref 8.9–10.3)
Chloride: 105 mmol/L (ref 98–111)
Creatinine, Ser: 1.07 mg/dL (ref 0.61–1.24)
GFR, Estimated: >60 mL/min (ref >60)
Glucose, Bld: 99 mg/dL (ref 70–99)
Potassium: 3.7 mmol/L (ref 3.5–5.1)
Sodium: 138 mmol/L (ref 135–145)
Total Bilirubin: 1.2 mg/dL (ref 0.3–1.2)
Total Protein: 7 g/dL (ref 6.5–8.1)

## 2020-11-06 LAB — CBC WITH DIFFERENTIAL/PLATELET
Abs Immature Granulocytes: 0.02 10*3/uL (ref 0.00–0.07)
Basophils Absolute: 0 10*3/uL (ref 0.0–0.1)
Basophils Relative: 0 %
Eosinophils Absolute: 0.2 10*3/uL (ref 0.0–0.5)
Eosinophils Relative: 3 %
HCT: 45 % (ref 39.0–52.0)
Hemoglobin: 14.8 g/dL (ref 13.0–17.0)
Immature Granulocytes: 0 %
Lymphocytes Relative: 12 %
Lymphs Abs: 0.6 10*3/uL — ABNORMAL LOW (ref 0.7–4.0)
MCH: 36.9 pg — ABNORMAL HIGH (ref 26.0–34.0)
MCHC: 32.9 g/dL (ref 30.0–36.0)
MCV: 112.2 fL — ABNORMAL HIGH (ref 80.0–100.0)
Monocytes Absolute: 0.7 10*3/uL (ref 0.1–1.0)
Monocytes Relative: 13 %
Neutro Abs: 3.9 10*3/uL (ref 1.7–7.7)
Neutrophils Relative %: 72 %
Platelets: 147 10*3/uL — ABNORMAL LOW (ref 150–400)
RBC: 4.01 MIL/uL — ABNORMAL LOW (ref 4.22–5.81)
RDW: 16.1 % — ABNORMAL HIGH (ref 11.5–15.5)
WBC: 5.5 10*3/uL (ref 4.0–10.5)
nRBC: 0 % (ref 0.0–0.2)

## 2020-11-06 LAB — TSH: TSH: 1.006 u[IU]/mL (ref 0.350–4.500)

## 2020-11-06 MED ORDER — SODIUM CHLORIDE 0.9 % IV SOLN: Freq: Once | INTRAVENOUS | Status: AC

## 2020-11-06 MED ORDER — HEPARIN SOD (PORK) LOCK FLUSH 100 UNIT/ML IV SOLN: 500.0000 [IU] | Freq: Once | INTRAVENOUS | Status: DC

## 2020-11-06 MED ORDER — SODIUM CHLORIDE 0.9% FLUSH
10.0000 mL | INTRAVENOUS | Status: DC | PRN
Start: 2020-11-06 — End: 2020-11-06
  Administered 2020-11-06: 10 mL

## 2020-11-06 MED ORDER — SODIUM CHLORIDE 0.9% FLUSH
10.0000 mL | INTRAVENOUS | Status: DC | PRN
  Administered 2020-11-06: 10 mL via INTRAVENOUS

## 2020-11-06 MED ORDER — SODIUM CHLORIDE 0.9 % IV SOLN
10.0000 mg/kg | Freq: Once | INTRAVENOUS | Status: AC
  Administered 2020-11-06: 740 mg via INTRAVENOUS
  Filled 2020-11-06: qty 10

## 2020-11-06 MED ORDER — HEPARIN SOD (PORK) LOCK FLUSH 100 UNIT/ML IV SOLN
500.0000 [IU] | Freq: Once | INTRAVENOUS | Status: AC | PRN
  Administered 2020-11-06: 500 [IU]

## 2020-11-06 NOTE — Progress Notes (Signed)
Patient presented this morning at 0911 for port flush, lab draw and treatment.  Port was accessed and blood return noted. Port is flushing patently.  Labs drawn.  Port left accessed for treatment.  Patient tolerated procedure well.  This nurse left patient alert and  in stable condition.

## 2020-11-06 NOTE — Progress Notes (Signed)
Patient presents today for Imfinzi infusion.  Vital signs and Labs are within parameters for treatment.  No new complaints since last visit.  Treatment given today per MD orders.  Tolerated infusion without adverse affects.  Vital signs stable.  No complaints at this time.  Discharge from clinic ambulatory in stable condition.  Alert and oriented X 3.  Follow up with [facility] as scheduled.

## 2020-11-06 NOTE — Progress Notes (Signed)
Patient presents today for Imfinzi.  Vital signs within paramerters for treatment.  Labs pending.  No new complaints since last visit.

## 2020-11-06 NOTE — Patient Instructions (Signed)
[facility] Discharge Instructions for Patients Receiving Chemotherapy  Today you received the following chemotherapy agents   To help prevent nausea and vomiting after your treatment, we encourage you to take your nausea medication   If you develop nausea and vomiting that is not controlled by your nausea medication, call the clinic.   BELOW ARE SYMPTOMS THAT SHOULD BE REPORTED IMMEDIATELY:  *FEVER GREATER THAN 100.5 F  *CHILLS WITH OR WITHOUT FEVER  NAUSEA AND VOMITING THAT IS NOT CONTROLLED WITH YOUR NAUSEA MEDICATION  *UNUSUAL SHORTNESS OF BREATH  *UNUSUAL BRUISING OR BLEEDING  TENDERNESS IN MOUTH AND THROAT WITH OR WITHOUT PRESENCE OF ULCERS  *URINARY PROBLEMS  *BOWEL PROBLEMS  UNUSUAL RASH Items with * indicate a potential emergency and should be followed up as soon as possible.  Feel free to call the clinic should you have any questions or concerns. The clinic phone number is [phone].  Please show the CHEMO ALERT CARD at check-in to the Emergency Department and triage nurse.   

## 2020-11-18 ENCOUNTER — Ambulatory Visit (HOSPITAL_COMMUNITY)
Admission: RE | Admit: 2020-11-18 | Discharge: 2020-11-18 | Disposition: A | Discharge status: Home / Self Care | Payer: Medicare Other | Source: Ambulatory Visit | Attending: Hematology | Admitting: Hematology

## 2020-11-18 ENCOUNTER — Other Ambulatory Visit: Payer: Self-pay

## 2020-11-18 DIAGNOSIS — I2510 Atherosclerotic heart disease of native coronary artery without angina pectoris: Secondary | ICD-10-CM | POA: Diagnosis not present

## 2020-11-18 DIAGNOSIS — C20 Malignant neoplasm of rectum: Secondary | ICD-10-CM | POA: Insufficient documentation

## 2020-11-18 DIAGNOSIS — J432 Centrilobular emphysema: Secondary | ICD-10-CM | POA: Diagnosis not present

## 2020-11-18 DIAGNOSIS — C3491 Malignant neoplasm of unspecified part of right bronchus or lung: Secondary | ICD-10-CM | POA: Diagnosis not present

## 2020-11-18 DIAGNOSIS — I708 Atherosclerosis of other arteries: Secondary | ICD-10-CM | POA: Diagnosis not present

## 2020-11-18 MED ORDER — IOHEXOL 300 MG/ML  SOLN
75.0000 mL | Freq: Once | INTRAMUSCULAR | Status: AC | PRN
  Administered 2020-11-18: 75 mL via INTRAVENOUS

## 2020-11-20 ENCOUNTER — Other Ambulatory Visit: Payer: Self-pay

## 2020-11-20 ENCOUNTER — Inpatient Hospital Stay (HOSPITAL_COMMUNITY): Payer: Medicare Other

## 2020-11-20 ENCOUNTER — Inpatient Hospital Stay (HOSPITAL_BASED_OUTPATIENT_CLINIC_OR_DEPARTMENT_OTHER): Payer: Medicare Other | Admitting: Hematology

## 2020-11-20 VITALS — BP 124/86 | HR 86 | Temp 97.0°F | Resp 20

## 2020-11-20 VITALS — BP 142/92 | HR 84 | Temp 97.1°F | Resp 20 | Wt 155.6 lb

## 2020-11-20 DIAGNOSIS — C3431 Malignant neoplasm of lower lobe, right bronchus or lung: Secondary | ICD-10-CM | POA: Diagnosis not present

## 2020-11-20 DIAGNOSIS — C20 Malignant neoplasm of rectum: Secondary | ICD-10-CM | POA: Diagnosis not present

## 2020-11-20 DIAGNOSIS — F17210 Nicotine dependence, cigarettes, uncomplicated: Secondary | ICD-10-CM | POA: Diagnosis not present

## 2020-11-20 DIAGNOSIS — C3491 Malignant neoplasm of unspecified part of right bronchus or lung: Secondary | ICD-10-CM

## 2020-11-20 DIAGNOSIS — Z5112 Encounter for antineoplastic immunotherapy: Secondary | ICD-10-CM | POA: Diagnosis not present

## 2020-11-20 DIAGNOSIS — Z79899 Other long term (current) drug therapy: Secondary | ICD-10-CM | POA: Diagnosis not present

## 2020-11-20 DIAGNOSIS — Z95828 Presence of other vascular implants and grafts: Secondary | ICD-10-CM

## 2020-11-20 LAB — CBC WITH DIFFERENTIAL/PLATELET
Abs Immature Granulocytes: 0.01 10*3/uL (ref 0.00–0.07)
Basophils Absolute: 0 10*3/uL (ref 0.0–0.1)
Basophils Relative: 1 %
Eosinophils Absolute: 0.1 10*3/uL (ref 0.0–0.5)
Eosinophils Relative: 3 %
HCT: 49.1 % (ref 39.0–52.0)
Hemoglobin: 16.2 g/dL (ref 13.0–17.0)
Immature Granulocytes: 0 %
Lymphocytes Relative: 14 %
Lymphs Abs: 0.7 10*3/uL (ref 0.7–4.0)
MCH: 36.9 pg — ABNORMAL HIGH (ref 26.0–34.0)
MCHC: 33 g/dL (ref 30.0–36.0)
MCV: 111.8 fL — ABNORMAL HIGH (ref 80.0–100.0)
Monocytes Absolute: 0.6 10*3/uL (ref 0.1–1.0)
Monocytes Relative: 12 %
Neutro Abs: 3.7 10*3/uL (ref 1.7–7.7)
Neutrophils Relative %: 70 %
Platelets: 172 10*3/uL (ref 150–400)
RBC: 4.39 MIL/uL (ref 4.22–5.81)
RDW: 15.4 % (ref 11.5–15.5)
WBC: 5.3 10*3/uL (ref 4.0–10.5)
nRBC: 0 % (ref 0.0–0.2)

## 2020-11-20 LAB — COMPREHENSIVE METABOLIC PANEL
ALT: 14 U/L (ref 0–44)
AST: 19 U/L (ref 15–41)
Albumin: 3.3 g/dL — ABNORMAL LOW (ref 3.5–5.0)
Alkaline Phosphatase: 64 U/L (ref 38–126)
Anion gap: 8 (ref 5–15)
BUN: 9 mg/dL (ref 6–20)
CO2: 25 mmol/L (ref 22–32)
Calcium: 9 mg/dL (ref 8.9–10.3)
Chloride: 107 mmol/L (ref 98–111)
Creatinine, Ser: 1.07 mg/dL (ref 0.61–1.24)
GFR, Estimated: >60 mL/min (ref >60)
Glucose, Bld: 102 mg/dL — ABNORMAL HIGH (ref 70–99)
Potassium: 3.9 mmol/L (ref 3.5–5.1)
Sodium: 140 mmol/L (ref 135–145)
Total Bilirubin: 1.1 mg/dL (ref 0.3–1.2)
Total Protein: 7.5 g/dL (ref 6.5–8.1)

## 2020-11-20 LAB — TSH: TSH: 1.491 u[IU]/mL (ref 0.350–4.500)

## 2020-11-20 MED ORDER — HEPARIN SOD (PORK) LOCK FLUSH 100 UNIT/ML IV SOLN
500.0000 [IU] | Freq: Once | INTRAVENOUS | Status: AC | PRN
  Administered 2020-11-20: 500 [IU]

## 2020-11-20 MED ORDER — SODIUM CHLORIDE 0.9 % IV SOLN: Freq: Once | INTRAVENOUS | Status: AC

## 2020-11-20 MED ORDER — HYDROCODONE-ACETAMINOPHEN 10-325 MG PO TABS: 1.0000 | ORAL_TABLET | ORAL | 0 refills | Status: DC | PRN

## 2020-11-20 MED ORDER — SODIUM CHLORIDE 0.9% FLUSH
10.0000 mL | INTRAVENOUS | Status: DC | PRN
  Administered 2020-11-20: 10 mL

## 2020-11-20 MED ORDER — SODIUM CHLORIDE 0.9 % IV SOLN
10.0000 mg/kg | Freq: Once | INTRAVENOUS | Status: AC
  Administered 2020-11-20: 740 mg via INTRAVENOUS
  Filled 2020-11-20: qty 10

## 2020-11-20 NOTE — Progress Notes (Signed)
Patient was assessed by Dr. Delton Coombes and labs have been reviewed.  Patient is okay to proceed with treatment today pending lab results. Primary RN and pharmacy aware.

## 2020-11-20 NOTE — Patient Instructions (Signed)
[facility] Discharge Instructions for Patients Receiving Chemotherapy  Today you received the following chemotherapy agents   To help prevent nausea and vomiting after your treatment, we encourage you to take your nausea medication   If you develop nausea and vomiting that is not controlled by your nausea medication, call the clinic.   BELOW ARE SYMPTOMS THAT SHOULD BE REPORTED IMMEDIATELY:  *FEVER GREATER THAN 100.5 F  *CHILLS WITH OR WITHOUT FEVER  NAUSEA AND VOMITING THAT IS NOT CONTROLLED WITH YOUR NAUSEA MEDICATION  *UNUSUAL SHORTNESS OF BREATH  *UNUSUAL BRUISING OR BLEEDING  TENDERNESS IN MOUTH AND THROAT WITH OR WITHOUT PRESENCE OF ULCERS  *URINARY PROBLEMS  *BOWEL PROBLEMS  UNUSUAL RASH Items with * indicate a potential emergency and should be followed up as soon as possible.  Feel free to call the clinic should you have any questions or concerns. The clinic phone number is [phone].  Please show the CHEMO ALERT CARD at check-in to the Emergency Department and triage nurse.   
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
## 2020-11-20 NOTE — Patient Instructions (Signed)
[place] at [facility] Discharge Instructions  You were seen today by Dr. Delton Coombes. He went over your recent results and scans. You received your treatment today; continue getting your treatment every 2 weeks. Dr. Delton Coombes will see you back in 4 weeks for labs and follow up.   Thank you for choosing [place] at [facility] to provide your oncology and hematology care.  To afford each patient quality time with our provider, please arrive at least 15 minutes before your scheduled appointment time.   If you have a lab appointment with the [facility] please come in thru the Main Entrance and check in at the main information desk  You need to re-schedule your appointment should you arrive 10 or more minutes late.  We strive to give you quality time with our providers, and arriving late affects you and other patients whose appointments are after yours.  Also, if you no show three or more times for appointments you may be dismissed from the clinic at the providers discretion.     Again, thank you for choosing [facility].  Our hope is that these requests will decrease the amount of time that you wait before being seen by our physicians.       _____________________________________________________________  Should you have questions after your visit to [facility], please contact our office at [phone] between the hours of 8:00 a.m. and 4:30 p.m.  Voicemails left after 4:00 p.m. will not be returned until the following business day.  For prescription refill requests, have your pharmacy contact our office and allow 72 hours.    Cancer Center Support Programs:   > Cancer Support Group  2nd Tuesday of the month 1pm-2pm, Journey Room

## 2020-11-20 NOTE — Progress Notes (Signed)
Patient presents today for treatment and follow up visit with Dr. Delton Coombes. Labs pending. Message received from [place] LPN/ Dr. Delton Coombes to proceed with treatment pending labs are within range.   Labs within parameters for treatment today.   Treatment given today per MD orders. Tolerated infusion without adverse affects. Vital signs stable. No complaints at this time. Discharged from clinic ambulatory in stable condition. Alert and oriented x 3. F/U with [facility] as scheduled.

## 2020-11-27 DIAGNOSIS — I2510 Atherosclerotic heart disease of native coronary artery without angina pectoris: Secondary | ICD-10-CM | POA: Diagnosis not present

## 2020-11-27 DIAGNOSIS — C20 Malignant neoplasm of rectum: Secondary | ICD-10-CM | POA: Diagnosis not present

## 2020-12-05 ENCOUNTER — Inpatient Hospital Stay (HOSPITAL_COMMUNITY): Payer: Medicare PPO | Attending: Hematology

## 2020-12-05 ENCOUNTER — Other Ambulatory Visit: Payer: Self-pay

## 2020-12-05 ENCOUNTER — Inpatient Hospital Stay (HOSPITAL_COMMUNITY): Payer: Medicare PPO | Attending: Hematology and Oncology

## 2020-12-05 VITALS — BP 118/76 | HR 100 | Temp 97.0°F | Resp 18

## 2020-12-05 DIAGNOSIS — Z5112 Encounter for antineoplastic immunotherapy: Secondary | ICD-10-CM | POA: Diagnosis not present

## 2020-12-05 DIAGNOSIS — C3491 Malignant neoplasm of unspecified part of right bronchus or lung: Secondary | ICD-10-CM | POA: Insufficient documentation

## 2020-12-05 DIAGNOSIS — C3431 Malignant neoplasm of lower lobe, right bronchus or lung: Secondary | ICD-10-CM | POA: Diagnosis not present

## 2020-12-05 DIAGNOSIS — F17210 Nicotine dependence, cigarettes, uncomplicated: Secondary | ICD-10-CM | POA: Diagnosis not present

## 2020-12-05 DIAGNOSIS — Z95828 Presence of other vascular implants and grafts: Secondary | ICD-10-CM

## 2020-12-05 DIAGNOSIS — Z79899 Other long term (current) drug therapy: Secondary | ICD-10-CM | POA: Diagnosis not present

## 2020-12-05 LAB — COMPREHENSIVE METABOLIC PANEL
ALT: 15 U/L (ref 0–44)
AST: 18 U/L (ref 15–41)
Albumin: 3.3 g/dL — ABNORMAL LOW (ref 3.5–5.0)
Alkaline Phosphatase: 70 U/L (ref 38–126)
Anion gap: 10 (ref 5–15)
BUN: 8 mg/dL (ref 6–20)
CO2: 23 mmol/L (ref 22–32)
Calcium: 8.8 mg/dL — ABNORMAL LOW (ref 8.9–10.3)
Chloride: 105 mmol/L (ref 98–111)
Creatinine, Ser: 1.11 mg/dL (ref 0.61–1.24)
GFR, Estimated: >60 mL/min (ref >60)
Glucose, Bld: 97 mg/dL (ref 70–99)
Potassium: 4 mmol/L (ref 3.5–5.1)
Sodium: 138 mmol/L (ref 135–145)
Total Bilirubin: 0.8 mg/dL (ref 0.3–1.2)
Total Protein: 7.2 g/dL (ref 6.5–8.1)

## 2020-12-05 LAB — CBC WITH DIFFERENTIAL/PLATELET
Abs Immature Granulocytes: 0.01 10*3/uL (ref 0.00–0.07)
Basophils Absolute: 0 10*3/uL (ref 0.0–0.1)
Basophils Relative: 0 %
Eosinophils Absolute: 0.2 10*3/uL (ref 0.0–0.5)
Eosinophils Relative: 3 %
HCT: 50.3 % (ref 39.0–52.0)
Hemoglobin: 16.8 g/dL (ref 13.0–17.0)
Immature Granulocytes: 0 %
Lymphocytes Relative: 12 %
Lymphs Abs: 0.8 10*3/uL (ref 0.7–4.0)
MCH: 36.8 pg — ABNORMAL HIGH (ref 26.0–34.0)
MCHC: 33.4 g/dL (ref 30.0–36.0)
MCV: 110.3 fL — ABNORMAL HIGH (ref 80.0–100.0)
Monocytes Absolute: 0.8 10*3/uL (ref 0.1–1.0)
Monocytes Relative: 11 %
Neutro Abs: 4.9 10*3/uL (ref 1.7–7.7)
Neutrophils Relative %: 74 %
Platelets: 145 10*3/uL — ABNORMAL LOW (ref 150–400)
RBC: 4.56 MIL/uL (ref 4.22–5.81)
RDW: 15.4 % (ref 11.5–15.5)
WBC: 6.7 10*3/uL (ref 4.0–10.5)
nRBC: 0 % (ref 0.0–0.2)

## 2020-12-05 MED ORDER — HEPARIN SOD (PORK) LOCK FLUSH 100 UNIT/ML IV SOLN
500.0000 [IU] | Freq: Once | INTRAVENOUS | Status: AC | PRN
  Administered 2020-12-05: 500 [IU]

## 2020-12-05 MED ORDER — SODIUM CHLORIDE 0.9 % IV SOLN
10.0000 mg/kg | Freq: Once | INTRAVENOUS | Status: AC
  Administered 2020-12-05: 740 mg via INTRAVENOUS
  Filled 2020-12-05: qty 10

## 2020-12-05 MED ORDER — SODIUM CHLORIDE 0.9% FLUSH
10.0000 mL | INTRAVENOUS | Status: DC | PRN
  Administered 2020-12-05: 10 mL

## 2020-12-05 MED ORDER — SODIUM CHLORIDE 0.9 % IV SOLN: Freq: Once | INTRAVENOUS | Status: AC

## 2020-12-05 NOTE — Progress Notes (Signed)
Patient presents today for Imfinzi infusion.  Vital signs within parameters for treatment. Labs pending.  Patient has no new complaints since last visit.  Labs within parameters for treatment.  Imfinzi infusion given today per MD orders.  Stable during infusion without adverse affects.  Vital signs stable.  No complaints at this time.  Discharge from clinic ambulatory in stable condition.  Alert and oriented X 3.  Follow up with [facility] as scheduled.

## 2020-12-05 NOTE — Patient Instructions (Signed)
Durvalumab infusion What is this medicine? DURVALUMAB (dur VAL ue mab) is a monoclonal antibody. It is used to treat lung cancer. This medicine may be used for other purposes; ask your health care provider or pharmacist if you have questions. COMMON BRAND NAME(S): IMFINZI What should I tell my health care provider before I take this medicine? They need to know if you have any of these conditions:  autoimmune diseases like Crohn's disease, ulcerative colitis, or lupus  have had or planning to have an allogeneic stem cell transplant (uses someone else's stem cells)  history of organ transplant  history of radiation to the chest  nervous system problems like myasthenia gravis or Guillain-Barre syndrome  an unusual or allergic reaction to durvalumab, other medicines, foods, dyes, or preservatives  pregnant or trying to get pregnant  breast-feeding How should I use this medicine? This medicine is for infusion into a vein. It is given by a health care professional in a hospital or clinic setting. A special MedGuide will be given to you before each treatment. Be sure to read this information carefully each time. Talk to your pediatrician regarding the use of this medicine in children. Special care may be needed. Overdosage: If you think you have taken too much of this medicine contact a poison control center or emergency room at once. NOTE: This medicine is only for you. Do not share this medicine with others. What if I miss a dose? It is important not to miss your dose. Call your doctor or health care professional if you are unable to keep an appointment. What may interact with this medicine? Interactions have not been studied. This list may not describe all possible interactions. Give your health care provider a list of all the medicines, herbs, non-prescription drugs, or dietary supplements you use. Also tell them if you smoke, drink alcohol, or use illegal drugs. Some items may interact  with your medicine. What should I watch for while using this medicine? This drug may make you feel generally unwell. Continue your course of treatment even though you feel ill unless your doctor tells you to stop. You may need blood work done while you are taking this medicine. Do not become pregnant while taking this medicine or for 3 months after stopping it. Women should inform their doctor if they wish to become pregnant or think they might be pregnant. There is a potential for serious side effects to an unborn child. Talk to your health care professional or pharmacist for more information. Do not breast-feed an infant while taking this medicine or for 3 months after stopping it. What side effects may I notice from receiving this medicine? Side effects that you should report to your doctor or health care professional as soon as possible:  allergic reactions like skin rash, itching or hives, swelling of the face, lips, or tongue  black, tarry stools  bloody or watery diarrhea  breathing problems  change in emotions or moods  change in sex drive  changes in vision  chest pain or chest tightness  chills  confusion  cough  facial flushing  fever  headache  signs and symptoms of high blood sugar such as dizziness; dry mouth; dry skin; fruity breath; nausea; stomach pain; increased hunger or thirst; increased urination  signs and symptoms of liver injury like dark yellow or brown urine; general ill feeling or flu-like symptoms; light-colored stools; loss of appetite; nausea; right upper belly pain; unusually weak or tired; yellowing of the eyes or skin  stomach pain  trouble passing urine or change in the amount of urine  weight gain or weight loss Side effects that usually do not require medical attention (report these to your doctor or health care professional if they continue or are bothersome):  bone pain  constipation  loss of appetite  muscle  pain  nausea  swelling of the ankles, feet, hands  tiredness This list may not describe all possible side effects. Call your doctor for medical advice about side effects. You may report side effects to FDA at 1-800-FDA-1088. Where should I keep my medicine? This drug is given in a hospital or clinic and will not be stored at home. NOTE: This sheet is a summary. It may not cover all possible information. If you have questions about this medicine, talk to your doctor, pharmacist, or health care provider.  2021 [employer]vier/Gold Standard (2019-11-23 13:01:29)

## 2020-12-19 ENCOUNTER — Inpatient Hospital Stay (HOSPITAL_COMMUNITY): Payer: Medicare PPO

## 2020-12-19 ENCOUNTER — Other Ambulatory Visit (HOSPITAL_COMMUNITY): Payer: Self-pay

## 2020-12-19 ENCOUNTER — Inpatient Hospital Stay (HOSPITAL_BASED_OUTPATIENT_CLINIC_OR_DEPARTMENT_OTHER): Payer: Medicare PPO | Admitting: Hematology

## 2020-12-19 ENCOUNTER — Other Ambulatory Visit: Payer: Self-pay

## 2020-12-19 VITALS — BP 121/95 | HR 80 | Temp 97.0°F | Resp 18 | Wt 155.6 lb

## 2020-12-19 VITALS — BP 120/84 | HR 92 | Temp 97.3°F | Resp 18

## 2020-12-19 DIAGNOSIS — Z79899 Other long term (current) drug therapy: Secondary | ICD-10-CM | POA: Diagnosis not present

## 2020-12-19 DIAGNOSIS — C20 Malignant neoplasm of rectum: Secondary | ICD-10-CM

## 2020-12-19 DIAGNOSIS — Z95828 Presence of other vascular implants and grafts: Secondary | ICD-10-CM

## 2020-12-19 DIAGNOSIS — F17210 Nicotine dependence, cigarettes, uncomplicated: Secondary | ICD-10-CM | POA: Diagnosis not present

## 2020-12-19 DIAGNOSIS — C3491 Malignant neoplasm of unspecified part of right bronchus or lung: Secondary | ICD-10-CM | POA: Diagnosis not present

## 2020-12-19 DIAGNOSIS — Z5112 Encounter for antineoplastic immunotherapy: Secondary | ICD-10-CM | POA: Diagnosis not present

## 2020-12-19 LAB — COMPREHENSIVE METABOLIC PANEL
ALT: 13 U/L (ref 0–44)
AST: 19 U/L (ref 15–41)
Albumin: 3.3 g/dL — ABNORMAL LOW (ref 3.5–5.0)
Alkaline Phosphatase: 70 U/L (ref 38–126)
Anion gap: 10 (ref 5–15)
BUN: 7 mg/dL (ref 6–20)
CO2: 26 mmol/L (ref 22–32)
Calcium: 8.6 mg/dL — ABNORMAL LOW (ref 8.9–10.3)
Chloride: 106 mmol/L (ref 98–111)
Creatinine, Ser: 1 mg/dL (ref 0.61–1.24)
GFR, Estimated: >60 mL/min (ref >60)
Glucose, Bld: 101 mg/dL — ABNORMAL HIGH (ref 70–99)
Potassium: 4 mmol/L (ref 3.5–5.1)
Sodium: 142 mmol/L (ref 135–145)
Total Bilirubin: 1 mg/dL (ref 0.3–1.2)
Total Protein: 7.3 g/dL (ref 6.5–8.1)

## 2020-12-19 LAB — CBC WITH DIFFERENTIAL/PLATELET
Abs Immature Granulocytes: 0.02 10*3/uL (ref 0.00–0.07)
Basophils Absolute: 0 10*3/uL (ref 0.0–0.1)
Basophils Relative: 0 %
Eosinophils Absolute: 0.2 10*3/uL (ref 0.0–0.5)
Eosinophils Relative: 3 %
HCT: 50.6 % (ref 39.0–52.0)
Hemoglobin: 16.6 g/dL (ref 13.0–17.0)
Immature Granulocytes: 0 %
Lymphocytes Relative: 13 %
Lymphs Abs: 0.8 10*3/uL (ref 0.7–4.0)
MCH: 35.2 pg — ABNORMAL HIGH (ref 26.0–34.0)
MCHC: 32.8 g/dL (ref 30.0–36.0)
MCV: 107.2 fL — ABNORMAL HIGH (ref 80.0–100.0)
Monocytes Absolute: 0.6 10*3/uL (ref 0.1–1.0)
Monocytes Relative: 10 %
Neutro Abs: 4.5 10*3/uL (ref 1.7–7.7)
Neutrophils Relative %: 74 %
Platelets: 178 10*3/uL (ref 150–400)
RBC: 4.72 MIL/uL (ref 4.22–5.81)
RDW: 15.1 % (ref 11.5–15.5)
WBC: 6 10*3/uL (ref 4.0–10.5)
nRBC: 0 % (ref 0.0–0.2)

## 2020-12-19 LAB — TSH: TSH: 1.288 u[IU]/mL (ref 0.350–4.500)

## 2020-12-19 MED ORDER — HYDROCODONE-ACETAMINOPHEN 10-325 MG PO TABS: 1.0000 | ORAL_TABLET | ORAL | 0 refills | Status: DC | PRN

## 2020-12-19 MED ORDER — SODIUM CHLORIDE 0.9 % IV SOLN: Freq: Once | INTRAVENOUS | Status: AC

## 2020-12-19 MED ORDER — HEPARIN SOD (PORK) LOCK FLUSH 100 UNIT/ML IV SOLN
500.0000 [IU] | Freq: Once | INTRAVENOUS | Status: AC | PRN
  Administered 2020-12-19: 500 [IU]

## 2020-12-19 MED ORDER — SODIUM CHLORIDE 0.9% FLUSH
10.0000 mL | INTRAVENOUS | Status: DC | PRN
  Administered 2020-12-19: 10 mL

## 2020-12-19 MED ORDER — DURVALUMAB 500 MG/10ML IV SOLN
10.0000 mg/kg | Freq: Once | INTRAVENOUS | Status: AC
Start: 2020-12-19 — End: 2020-12-19
  Administered 2020-12-19: 740 mg via INTRAVENOUS
  Filled 2020-12-19: qty 10

## 2020-12-19 NOTE — Patient Instructions (Signed)
[place] at [facility] Discharge Instructions  You were seen today by Dr. Delton Coombes. He went over your recent results. You received your treatment today; continue getting your treatment every 2 weeks. You will be scheduled to have a CT scan of your chest done before your next visit. Keep your appointment with [name] on March 31. Dr. Delton Coombes will see you back in 1 month for labs and follow up.   Thank you for choosing [place] at [facility] to provide your oncology and hematology care.  To afford each patient quality time with our provider, please arrive at least 15 minutes before your scheduled appointment time.   If you have a lab appointment with the [facility] please come in thru the Main Entrance and check in at the main information desk  You need to re-schedule your appointment should you arrive 10 or more minutes late.  We strive to give you quality time with our providers, and arriving late affects you and other patients whose appointments are after yours.  Also, if you no show three or more times for appointments you may be dismissed from the clinic at the providers discretion.     Again, thank you for choosing [facility].  Our hope is that these requests will decrease the amount of time that you wait before being seen by our physicians.       _____________________________________________________________  Should you have questions after your visit to [facility], please contact our office at [phone] between the hours of 8:00 a.m. and 4:30 p.m.  Voicemails left after 4:00 p.m. will not be returned until the following business day.  For prescription refill requests, have your pharmacy contact our office and allow 72 hours.    Cancer Center Support Programs:   > Cancer Support Group  2nd Tuesday of the month 1pm-2pm, Journey Room

## 2020-12-19 NOTE — Patient Instructions (Signed)
[facility] Discharge Instructions for Patients Receiving Chemotherapy   Beginning January 23rd 2017 lab work for the Cancer Center will be done in the  Main lab at [place] on 1st floor. If you have a lab appointment with the Cancer Center please come in thru the  Main Entrance and check in at the main information desk   Today you received the following chemotherapy agents Imfinzi  To help prevent nausea and vomiting after your treatment, we encourage you to take your nausea medication   If you develop nausea and vomiting, or diarrhea that is not controlled by your medication, call the clinic.  The clinic phone number is [phone]. Office hours are Monday-Friday 8:30am-5:00pm.  BELOW ARE SYMPTOMS THAT SHOULD BE REPORTED IMMEDIATELY:  *FEVER GREATER THAN 101.0 F  *CHILLS WITH OR WITHOUT FEVER  NAUSEA AND VOMITING THAT IS NOT CONTROLLED WITH YOUR NAUSEA MEDICATION  *UNUSUAL SHORTNESS OF BREATH  *UNUSUAL BRUISING OR BLEEDING  TENDERNESS IN MOUTH AND THROAT WITH OR WITHOUT PRESENCE OF ULCERS  *URINARY PROBLEMS  *BOWEL PROBLEMS  UNUSUAL RASH Items with * indicate a potential emergency and should be followed up as soon as possible. If you have an emergency after office hours please contact your primary care physician or go to the nearest emergency department.  Please call the clinic during office hours if you have any questions or concerns.   You may also contact the Patient Navigator at [phone] should you have any questions or need assistance in obtaining follow up care.      Resources For Cancer Patients and their Caregivers ? American Cancer Society: Can assist with transportation, wigs, general needs, runs Look Good Feel Better.        1-888-227-6333 ? Cancer Care: Provides financial assistance, online support groups, medication/co-pay assistance.  1-800-813-HOPE (4673) ? [facility] Assists [place] cancer  patients and their families through emotional , educational and financial support.  336-427-4357 ? [place] DSS Where to apply for food stamps, Medicaid and utility assistance. 336-342-1394 ? [facility]: Transportation to medical appointments. 336-347-2287 ? Social Security Administration: May apply for disability if have a Stage IV cancer. 336-342-7796 1-800-772-1213 ? [place] [facility]ging, Disability and Transit Services: Assists with nutrition, care and transit needs. 336-349-2343          

## 2020-12-19 NOTE — Progress Notes (Signed)
Patient was assessed by Dr. Katragadda and labs have been reviewed.  Patient is okay to proceed with treatment today. Primary RN and pharmacy aware.   
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
## 2020-12-19 NOTE — Progress Notes (Signed)
Juan Wheeler presents today for D1C8 Imfinzi. Pt denies any new changes or symptoms since last treatment. Lab results and vitals have been reviewed and are stable and within parameters for treatment. Patient has been assessed by Dr. Delton Coombes who has approved proceeding with treatment today as planned.  Infusions tolerated without incident or complaint. VSS upon completion of treatment. Port flushed and deaccessed per protocol, see MAR and IV flowsheet for details. Discharged in satisfactory condition with follow up instructions.

## 2020-12-26 DIAGNOSIS — S31809A Unspecified open wound of unspecified buttock, initial encounter: Secondary | ICD-10-CM | POA: Diagnosis not present

## 2021-01-02 ENCOUNTER — Inpatient Hospital Stay (HOSPITAL_COMMUNITY): Payer: Medicare PPO

## 2021-01-02 ENCOUNTER — Other Ambulatory Visit: Payer: Self-pay

## 2021-01-02 ENCOUNTER — Inpatient Hospital Stay (HOSPITAL_COMMUNITY): Payer: Medicare PPO | Attending: Hematology

## 2021-01-02 VITALS — BP 101/77 | HR 60 | Resp 18

## 2021-01-02 DIAGNOSIS — Z5112 Encounter for antineoplastic immunotherapy: Secondary | ICD-10-CM | POA: Diagnosis not present

## 2021-01-02 DIAGNOSIS — C20 Malignant neoplasm of rectum: Secondary | ICD-10-CM

## 2021-01-02 DIAGNOSIS — C3491 Malignant neoplasm of unspecified part of right bronchus or lung: Secondary | ICD-10-CM

## 2021-01-02 DIAGNOSIS — C3431 Malignant neoplasm of lower lobe, right bronchus or lung: Secondary | ICD-10-CM | POA: Insufficient documentation

## 2021-01-02 DIAGNOSIS — Z95828 Presence of other vascular implants and grafts: Secondary | ICD-10-CM

## 2021-01-02 DIAGNOSIS — F17210 Nicotine dependence, cigarettes, uncomplicated: Secondary | ICD-10-CM | POA: Diagnosis not present

## 2021-01-02 LAB — COMPREHENSIVE METABOLIC PANEL
ALT: 16 U/L (ref 0–44)
AST: 20 U/L (ref 15–41)
Albumin: 3.3 g/dL — ABNORMAL LOW (ref 3.5–5.0)
Alkaline Phosphatase: 74 U/L (ref 38–126)
Anion gap: 12 (ref 5–15)
BUN: 9 mg/dL (ref 6–20)
CO2: 23 mmol/L (ref 22–32)
Calcium: 9 mg/dL (ref 8.9–10.3)
Chloride: 105 mmol/L (ref 98–111)
Creatinine, Ser: 1.07 mg/dL (ref 0.61–1.24)
GFR, Estimated: >60 mL/min (ref >60)
Glucose, Bld: 93 mg/dL (ref 70–99)
Potassium: 3.7 mmol/L (ref 3.5–5.1)
Sodium: 140 mmol/L (ref 135–145)
Total Bilirubin: 1.6 mg/dL — ABNORMAL HIGH (ref 0.3–1.2)
Total Protein: 7.2 g/dL (ref 6.5–8.1)

## 2021-01-02 LAB — CBC WITH DIFFERENTIAL/PLATELET
Abs Immature Granulocytes: 0.01 10*3/uL (ref 0.00–0.07)
Basophils Absolute: 0 10*3/uL (ref 0.0–0.1)
Basophils Relative: 0 %
Eosinophils Absolute: 0.2 10*3/uL (ref 0.0–0.5)
Eosinophils Relative: 3 %
HCT: 51 % (ref 39.0–52.0)
Hemoglobin: 16.8 g/dL (ref 13.0–17.0)
Immature Granulocytes: 0 %
Lymphocytes Relative: 14 %
Lymphs Abs: 0.9 10*3/uL (ref 0.7–4.0)
MCH: 34.9 pg — ABNORMAL HIGH (ref 26.0–34.0)
MCHC: 32.9 g/dL (ref 30.0–36.0)
MCV: 106 fL — ABNORMAL HIGH (ref 80.0–100.0)
Monocytes Absolute: 0.6 10*3/uL (ref 0.1–1.0)
Monocytes Relative: 8 %
Neutro Abs: 5.2 10*3/uL (ref 1.7–7.7)
Neutrophils Relative %: 75 %
Platelets: 157 10*3/uL (ref 150–400)
RBC: 4.81 MIL/uL (ref 4.22–5.81)
RDW: 15.6 % — ABNORMAL HIGH (ref 11.5–15.5)
WBC: 7 10*3/uL (ref 4.0–10.5)
nRBC: 0 % (ref 0.0–0.2)

## 2021-01-02 LAB — MAGNESIUM: Magnesium: 1.8 mg/dL (ref 1.7–2.4)

## 2021-01-02 MED ORDER — SODIUM CHLORIDE 0.9 % IV SOLN
Freq: Once | INTRAVENOUS | Status: AC
Start: 2021-01-02 — End: 2021-01-02

## 2021-01-02 MED ORDER — HEPARIN SOD (PORK) LOCK FLUSH 100 UNIT/ML IV SOLN
500.0000 [IU] | Freq: Once | INTRAVENOUS | Status: AC | PRN
  Administered 2021-01-02: 500 [IU]

## 2021-01-02 MED ORDER — SODIUM CHLORIDE 0.9 % IV SOLN
10.0000 mg/kg | Freq: Once | INTRAVENOUS | Status: AC
  Administered 2021-01-02: 740 mg via INTRAVENOUS
  Filled 2021-01-02: qty 10

## 2021-01-02 MED ORDER — SODIUM CHLORIDE 0.9% FLUSH
10.0000 mL | INTRAVENOUS | Status: DC | PRN
  Administered 2021-01-02: 10 mL

## 2021-01-02 NOTE — Progress Notes (Signed)
Patient presents today for Imfinzi infusion.  Vital signs wtihin parameters for treatment.  Labs pending.  Patients only complaint is that he gets short of breath with minimal exertion.  Imfinzi infusion given today per MD orders.  Stable during infusion without adverse affects.  Vital signs stable.  No complaints at this time.  Discharge from clinic ambulatory in stable condition.  Alert and oriented X 3.  Follow up with [facility] as scheduled.

## 2021-01-02 NOTE — Patient Instructions (Signed)
Durvalumab infusion What is this medicine? DURVALUMAB (dur VAL ue mab) is a monoclonal antibody. It is used to treat lung cancer. This medicine may be used for other purposes; ask your health care provider or pharmacist if you have questions. COMMON BRAND NAME(S): IMFINZI What should I tell my health care provider before I take this medicine? They need to know if you have any of these conditions:  autoimmune diseases like Crohn's disease, ulcerative colitis, or lupus  have had or planning to have an allogeneic stem cell transplant (uses someone else's stem cells)  history of organ transplant  history of radiation to the chest  nervous system problems like myasthenia gravis or Guillain-Barre syndrome  an unusual or allergic reaction to durvalumab, other medicines, foods, dyes, or preservatives  pregnant or trying to get pregnant  breast-feeding How should I use this medicine? This medicine is for infusion into a vein. It is given by a health care professional in a hospital or clinic setting. A special MedGuide will be given to you before each treatment. Be sure to read this information carefully each time. Talk to your pediatrician regarding the use of this medicine in children. Special care may be needed. Overdosage: If you think you have taken too much of this medicine contact a poison control center or emergency room at once. NOTE: This medicine is only for you. Do not share this medicine with others. What if I miss a dose? It is important not to miss your dose. Call your doctor or health care professional if you are unable to keep an appointment. What may interact with this medicine? Interactions have not been studied. This list may not describe all possible interactions. Give your health care provider a list of all the medicines, herbs, non-prescription drugs, or dietary supplements you use. Also tell them if you smoke, drink alcohol, or use illegal drugs. Some items may interact  with your medicine. What should I watch for while using this medicine? This drug may make you feel generally unwell. Continue your course of treatment even though you feel ill unless your doctor tells you to stop. You may need blood work done while you are taking this medicine. Do not become pregnant while taking this medicine or for 3 months after stopping it. Women should inform their doctor if they wish to become pregnant or think they might be pregnant. There is a potential for serious side effects to an unborn child. Talk to your health care professional or pharmacist for more information. Do not breast-feed an infant while taking this medicine or for 3 months after stopping it. What side effects may I notice from receiving this medicine? Side effects that you should report to your doctor or health care professional as soon as possible:  allergic reactions like skin rash, itching or hives, swelling of the face, lips, or tongue  black, tarry stools  bloody or watery diarrhea  breathing problems  change in emotions or moods  change in sex drive  changes in vision  chest pain or chest tightness  chills  confusion  cough  facial flushing  fever  headache  signs and symptoms of high blood sugar such as dizziness; dry mouth; dry skin; fruity breath; nausea; stomach pain; increased hunger or thirst; increased urination  signs and symptoms of liver injury like dark yellow or brown urine; general ill feeling or flu-like symptoms; light-colored stools; loss of appetite; nausea; right upper belly pain; unusually weak or tired; yellowing of the eyes or skin  stomach pain  trouble passing urine or change in the amount of urine  weight gain or weight loss Side effects that usually do not require medical attention (report these to your doctor or health care professional if they continue or are bothersome):  bone pain  constipation  loss of appetite  muscle  pain  nausea  swelling of the ankles, feet, hands  tiredness This list may not describe all possible side effects. Call your doctor for medical advice about side effects. You may report side effects to FDA at 1-800-FDA-1088. Where should I keep my medicine? This drug is given in a hospital or clinic and will not be stored at home. NOTE: This sheet is a summary. It may not cover all possible information. If you have questions about this medicine, talk to your doctor, pharmacist, or health care provider.  2021 [employer]vier/Gold Standard (2019-11-23 13:01:29)

## 2021-01-02 NOTE — Progress Notes (Signed)
Patients port flushed without difficulty.  Good blood return noted with no bruising or swelling noted at site.  stable during access and blood draw.  Patient is remaining accessed for treatment.

## 2021-01-07 DIAGNOSIS — Z933 Colostomy status: Secondary | ICD-10-CM | POA: Diagnosis not present

## 2021-01-07 DIAGNOSIS — S31829A Unspecified open wound of left buttock, initial encounter: Secondary | ICD-10-CM | POA: Diagnosis not present

## 2021-01-07 DIAGNOSIS — C20 Malignant neoplasm of rectum: Secondary | ICD-10-CM | POA: Diagnosis not present

## 2021-01-07 DIAGNOSIS — Z4801 Encounter for change or removal of surgical wound dressing: Secondary | ICD-10-CM | POA: Diagnosis not present

## 2021-01-15 ENCOUNTER — Ambulatory Visit (HOSPITAL_COMMUNITY)
Admission: RE | Admit: 2021-01-15 | Discharge: 2021-01-15 | Disposition: A | Discharge status: Home / Self Care | Payer: Medicare PPO | Source: Ambulatory Visit | Attending: Hematology | Admitting: Hematology

## 2021-01-15 ENCOUNTER — Other Ambulatory Visit: Payer: Self-pay

## 2021-01-15 ENCOUNTER — Inpatient Hospital Stay (HOSPITAL_COMMUNITY)
Admission: EM | Admit: 2021-01-15 | Discharge: 2021-01-18 | DRG: 175 | Disposition: A | Discharge status: Home / Self Care | Payer: Medicare PPO | Attending: Internal Medicine | Admitting: Internal Medicine

## 2021-01-15 ENCOUNTER — Emergency Department (HOSPITAL_COMMUNITY): Payer: Medicare PPO

## 2021-01-15 ENCOUNTER — Encounter (HOSPITAL_COMMUNITY): Payer: Self-pay

## 2021-01-15 DIAGNOSIS — G894 Chronic pain syndrome: Secondary | ICD-10-CM | POA: Diagnosis present

## 2021-01-15 DIAGNOSIS — I2609 Other pulmonary embolism with acute cor pulmonale: Secondary | ICD-10-CM | POA: Diagnosis not present

## 2021-01-15 DIAGNOSIS — Z20822 Contact with and (suspected) exposure to covid-19: Secondary | ICD-10-CM | POA: Diagnosis present

## 2021-01-15 DIAGNOSIS — F17210 Nicotine dependence, cigarettes, uncomplicated: Secondary | ICD-10-CM | POA: Diagnosis present

## 2021-01-15 DIAGNOSIS — Z85048 Personal history of other malignant neoplasm of rectum, rectosigmoid junction, and anus: Secondary | ICD-10-CM | POA: Diagnosis not present

## 2021-01-15 DIAGNOSIS — Z923 Personal history of irradiation: Secondary | ICD-10-CM

## 2021-01-15 DIAGNOSIS — Z832 Family history of diseases of the blood and blood-forming organs and certain disorders involving the immune mechanism: Secondary | ICD-10-CM

## 2021-01-15 DIAGNOSIS — Z955 Presence of coronary angioplasty implant and graft: Secondary | ICD-10-CM | POA: Diagnosis not present

## 2021-01-15 DIAGNOSIS — I10 Essential (primary) hypertension: Secondary | ICD-10-CM | POA: Diagnosis present

## 2021-01-15 DIAGNOSIS — Z515 Encounter for palliative care: Secondary | ICD-10-CM | POA: Diagnosis not present

## 2021-01-15 DIAGNOSIS — Z86718 Personal history of other venous thrombosis and embolism: Secondary | ICD-10-CM | POA: Diagnosis not present

## 2021-01-15 DIAGNOSIS — Z8249 Family history of ischemic heart disease and other diseases of the circulatory system: Secondary | ICD-10-CM

## 2021-01-15 DIAGNOSIS — K219 Gastro-esophageal reflux disease without esophagitis: Secondary | ICD-10-CM | POA: Diagnosis not present

## 2021-01-15 DIAGNOSIS — I2694 Multiple subsegmental pulmonary emboli without acute cor pulmonale: Principal | ICD-10-CM | POA: Diagnosis present

## 2021-01-15 DIAGNOSIS — I4891 Unspecified atrial fibrillation: Secondary | ICD-10-CM | POA: Diagnosis not present

## 2021-01-15 DIAGNOSIS — J449 Chronic obstructive pulmonary disease, unspecified: Secondary | ICD-10-CM | POA: Diagnosis present

## 2021-01-15 DIAGNOSIS — R778 Other specified abnormalities of plasma proteins: Secondary | ICD-10-CM

## 2021-01-15 DIAGNOSIS — I255 Ischemic cardiomyopathy: Secondary | ICD-10-CM | POA: Diagnosis present

## 2021-01-15 DIAGNOSIS — D6959 Other secondary thrombocytopenia: Secondary | ICD-10-CM | POA: Diagnosis present

## 2021-01-15 DIAGNOSIS — C3490 Malignant neoplasm of unspecified part of unspecified bronchus or lung: Secondary | ICD-10-CM

## 2021-01-15 DIAGNOSIS — C787 Secondary malignant neoplasm of liver and intrahepatic bile duct: Secondary | ICD-10-CM | POA: Diagnosis not present

## 2021-01-15 DIAGNOSIS — G893 Neoplasm related pain (acute) (chronic): Secondary | ICD-10-CM | POA: Diagnosis present

## 2021-01-15 DIAGNOSIS — R718 Other abnormality of red blood cells: Secondary | ICD-10-CM

## 2021-01-15 DIAGNOSIS — I480 Paroxysmal atrial fibrillation: Secondary | ICD-10-CM | POA: Diagnosis not present

## 2021-01-15 DIAGNOSIS — E46 Unspecified protein-calorie malnutrition: Secondary | ICD-10-CM | POA: Diagnosis not present

## 2021-01-15 DIAGNOSIS — I739 Peripheral vascular disease, unspecified: Secondary | ICD-10-CM | POA: Diagnosis present

## 2021-01-15 DIAGNOSIS — C7801 Secondary malignant neoplasm of right lung: Secondary | ICD-10-CM | POA: Diagnosis present

## 2021-01-15 DIAGNOSIS — Z9225 Personal history of immunosupression therapy: Secondary | ICD-10-CM

## 2021-01-15 DIAGNOSIS — R0602 Shortness of breath: Secondary | ICD-10-CM | POA: Diagnosis not present

## 2021-01-15 DIAGNOSIS — J432 Centrilobular emphysema: Secondary | ICD-10-CM | POA: Diagnosis not present

## 2021-01-15 DIAGNOSIS — M545 Low back pain, unspecified: Secondary | ICD-10-CM | POA: Diagnosis present

## 2021-01-15 DIAGNOSIS — R54 Age-related physical debility: Secondary | ICD-10-CM | POA: Diagnosis present

## 2021-01-15 DIAGNOSIS — Z79899 Other long term (current) drug therapy: Secondary | ICD-10-CM

## 2021-01-15 DIAGNOSIS — C7800 Secondary malignant neoplasm of unspecified lung: Secondary | ICD-10-CM

## 2021-01-15 DIAGNOSIS — C20 Malignant neoplasm of rectum: Secondary | ICD-10-CM | POA: Diagnosis not present

## 2021-01-15 DIAGNOSIS — I2510 Atherosclerotic heart disease of native coronary artery without angina pectoris: Secondary | ICD-10-CM | POA: Diagnosis present

## 2021-01-15 DIAGNOSIS — J9601 Acute respiratory failure with hypoxia: Secondary | ICD-10-CM | POA: Diagnosis present

## 2021-01-15 DIAGNOSIS — Z7901 Long term (current) use of anticoagulants: Secondary | ICD-10-CM

## 2021-01-15 DIAGNOSIS — Z809 Family history of malignant neoplasm, unspecified: Secondary | ICD-10-CM | POA: Diagnosis not present

## 2021-01-15 DIAGNOSIS — Z885 Allergy status to narcotic agent status: Secondary | ICD-10-CM

## 2021-01-15 DIAGNOSIS — J90 Pleural effusion, not elsewhere classified: Secondary | ICD-10-CM | POA: Diagnosis present

## 2021-01-15 DIAGNOSIS — C3491 Malignant neoplasm of unspecified part of right bronchus or lung: Secondary | ICD-10-CM | POA: Diagnosis not present

## 2021-01-15 DIAGNOSIS — Z7189 Other specified counseling: Secondary | ICD-10-CM | POA: Diagnosis not present

## 2021-01-15 DIAGNOSIS — I2699 Other pulmonary embolism without acute cor pulmonale: Secondary | ICD-10-CM | POA: Diagnosis present

## 2021-01-15 DIAGNOSIS — E8809 Other disorders of plasma-protein metabolism, not elsewhere classified: Secondary | ICD-10-CM | POA: Diagnosis present

## 2021-01-15 DIAGNOSIS — Z791 Long term (current) use of non-steroidal anti-inflammatories (NSAID): Secondary | ICD-10-CM

## 2021-01-15 DIAGNOSIS — Z6822 Body mass index (BMI) 22.0-22.9, adult: Secondary | ICD-10-CM | POA: Diagnosis not present

## 2021-01-15 DIAGNOSIS — Z7982 Long term (current) use of aspirin: Secondary | ICD-10-CM

## 2021-01-15 LAB — CBC WITH DIFFERENTIAL/PLATELET
Abs Immature Granulocytes: 0.01 10*3/uL (ref 0.00–0.07)
Basophils Absolute: 0 10*3/uL (ref 0.0–0.1)
Basophils Relative: 1 %
Eosinophils Absolute: 0.2 10*3/uL (ref 0.0–0.5)
Eosinophils Relative: 3 %
HCT: 50 % (ref 39.0–52.0)
Hemoglobin: 16.7 g/dL (ref 13.0–17.0)
Immature Granulocytes: 0 %
Lymphocytes Relative: 13 %
Lymphs Abs: 0.7 10*3/uL (ref 0.7–4.0)
MCH: 34.7 pg — ABNORMAL HIGH (ref 26.0–34.0)
MCHC: 33.4 g/dL (ref 30.0–36.0)
MCV: 104 fL — ABNORMAL HIGH (ref 80.0–100.0)
Monocytes Absolute: 0.5 10*3/uL (ref 0.1–1.0)
Monocytes Relative: 9 %
Neutro Abs: 3.9 10*3/uL (ref 1.7–7.7)
Neutrophils Relative %: 74 %
Platelets: 154 10*3/uL (ref 150–400)
RBC: 4.81 MIL/uL (ref 4.22–5.81)
RDW: 15.7 % — ABNORMAL HIGH (ref 11.5–15.5)
WBC: 5.2 10*3/uL (ref 4.0–10.5)
nRBC: 0 % (ref 0.0–0.2)

## 2021-01-15 LAB — COMPREHENSIVE METABOLIC PANEL
ALT: 18 U/L (ref 0–44)
AST: 20 U/L (ref 15–41)
Albumin: 3.1 g/dL — ABNORMAL LOW (ref 3.5–5.0)
Alkaline Phosphatase: 68 U/L (ref 38–126)
Anion gap: 8 (ref 5–15)
BUN: 10 mg/dL (ref 6–20)
CO2: 24 mmol/L (ref 22–32)
Calcium: 8.8 mg/dL — ABNORMAL LOW (ref 8.9–10.3)
Chloride: 104 mmol/L (ref 98–111)
Creatinine, Ser: 1.16 mg/dL (ref 0.61–1.24)
GFR, Estimated: >60 mL/min (ref >60)
Glucose, Bld: 91 mg/dL (ref 70–99)
Potassium: 4 mmol/L (ref 3.5–5.1)
Sodium: 136 mmol/L (ref 135–145)
Total Bilirubin: 1 mg/dL (ref 0.3–1.2)
Total Protein: 7.2 g/dL (ref 6.5–8.1)

## 2021-01-15 LAB — TSH: TSH: 0.882 u[IU]/mL (ref 0.350–4.500)

## 2021-01-15 LAB — RESP PANEL BY RT-PCR (FLU A&B, COVID) ARPGX2
Influenza A by PCR: NEGATIVE
Influenza B by PCR: NEGATIVE
SARS Coronavirus 2 by RT PCR: NEGATIVE

## 2021-01-15 LAB — TROPONIN I (HIGH SENSITIVITY)
Troponin I (High Sensitivity): 45 ng/L — ABNORMAL HIGH (ref <18)
Troponin I (High Sensitivity): 56 ng/L — ABNORMAL HIGH (ref <18)

## 2021-01-15 MED ORDER — DILTIAZEM HCL-DEXTROSE 125-5 MG/125ML-% IV SOLN (PREMIX)
5.0000 mg/h | INTRAVENOUS | Status: DC
  Administered 2021-01-15 – 2021-01-17 (×2): 5 mg/h via INTRAVENOUS
  Filled 2021-01-15 (×3): qty 125

## 2021-01-15 MED ORDER — IOHEXOL 350 MG/ML SOLN
75.0000 mL | Freq: Once | INTRAVENOUS | Status: AC | PRN
  Administered 2021-01-15: 75 mL via INTRAVENOUS

## 2021-01-15 MED ORDER — IOHEXOL 300 MG/ML  SOLN
75.0000 mL | Freq: Once | INTRAMUSCULAR | Status: AC | PRN
  Administered 2021-01-15: 75 mL via INTRAVENOUS

## 2021-01-15 MED ORDER — ENOXAPARIN SODIUM 80 MG/0.8ML ~~LOC~~ SOLN
70.0000 mg | Freq: Two times a day (BID) | SUBCUTANEOUS | Status: DC
  Administered 2021-01-16: 70 mg via SUBCUTANEOUS
  Filled 2021-01-15: qty 0.8

## 2021-01-15 MED ORDER — WARFARIN - PHARMACIST DOSING INPATIENT: Freq: Every day | Status: DC

## 2021-01-15 MED ORDER — WARFARIN SODIUM 7.5 MG PO TABS
7.5000 mg | ORAL_TABLET | Freq: Once | ORAL | Status: AC
  Administered 2021-01-16: 7.5 mg via ORAL
  Filled 2021-01-15: qty 1

## 2021-01-15 MED ORDER — DILTIAZEM HCL 25 MG/5ML IV SOLN
20.0000 mg | Freq: Once | INTRAVENOUS | Status: AC
  Administered 2021-01-15: 20 mg via INTRAVENOUS
  Filled 2021-01-15: qty 5

## 2021-01-15 NOTE — ED Provider Notes (Signed)
[facility] EMERGENCY DEPARTMENT Provider Note   CSN: [id number] Arrival date & time: 01/15/21  1657     History Chief Complaint  Patient presents with  . Shortness of Breath    Juan Wheeler is a 60 y.o. male.  HPI   This patient is a 60 year old male, with a known history of colorectal cancer status post resection and diverting ostomy, history of coronary disease status post stenting in 2014, history of DVT and a history of atrial fibrillation currently on Eliquis.  The patient has known lung cancer which was diagnosed in the last couple of years, he has completed radiation and immunotherapy however he went for a CT scan today to further evaluate his lungs to look for progression and there was a possibility of a filling defect in one of his pulmonary arteries.  He was sent here by his oncologist Dr. Delton Coombes for a CT angiogram and further evaluation.  The patient does report to me that he is having exertional shortness of breath and exertional heaviness on his chest, this is concerning and seems to be rapidly and more aggressively progressive.  He states that he can hardly walk across the room or out the doors of his house without becoming short of breath and needing to stop for a break.  This is very unusual and is gotten much worse since last month.  Past Medical History:  Diagnosis Date  . Chronic lower back pain    a. Followed by pain management at [facility].  . Colon cancer [facility])    rectal cancer  . Coronary artery disease    a. 03/2013: abnl nuc -> LHC s/p DES to LCx, residual moderate disease in LAD (med rx unless refractory angina). b. Not on BB due to bradycardia.  Marland Kitchen DVT (deep venous thrombosis) ([place]) ~ 2013  . Dysrhythmia    AFib  . GERD (gastroesophageal reflux disease)   . H/O necrotising fasciitis   . History of blood transfusion    "once; after throwing up alot of blood" (04/17/2013)  . Hypertension   . LV dysfunction    a. EF 45% in 03/2013.  Marland Kitchen PAD  (peripheral artery disease) ([name])    a. Occlusion of the right internal iliac artery, with significant atherosclerosis in the left internal iliac which was not amenable to reconstruction per notes from [place] in place 05/30/2020  . Pulmonary nodules 09/30/2014  . Rectal cancer ([place])   . Tobacco abuse     Patient Active Problem List   Diagnosis Date Noted  . Neutropenia, drug-induced ([place]) 07/25/2020  . Port-A-Cath in place 05/30/2020  . Squamous cell lung cancer, right ([place]) 05/22/2020  . History of DVT (deep vein thrombosis) 07/08/2016  . Paroxysmal atrial fibrillation ([place]) 07/08/2016  . SOB (shortness of breath) 07/08/2016  . Sepsis secondary to UTI ([place]) 07/08/2016  . Hypokalemia   . Gastroesophageal reflux disease   . Coronary artery disease involving native coronary artery of native heart with angina pectoris ([place])   . AKI (acute kidney injury) ([place])   . Fournier's gangrene in male 06/05/2016  . Abscess 06/05/2016  . Sepsis ([place])   . Necrotizing fasciitis ([place])   . Central venous catheter in place   . Acute respiratory failure ([place])   . PAD (peripheral artery disease) ([place]) 07/02/2015  . Preoperative cardiovascular examination 06/12/2015  . Cardiomyopathy, ischemic 06/12/2015  . Pulmonary nodules 09/30/2014  . ASCVD (arteriosclerotic cardiovascular disease) 05/02/2013  . Unstable angina ([place]) 04/18/2013  .  Chest pain 03/29/2013  . Tobacco abuse 03/29/2013  . Chronic pain syndrome 11/09/2012  . Pain in joint, pelvic region and thigh 11/09/2012  . Neuralgia and neuritis 10/12/2012  . Atherosclerosis of native arteries of extremity with intermittent claudication ([place]) 08/19/2012  . Backache 03/24/2012  . Peripheral vascular disease ([place]) 12/04/2011  . Compression of vein 12/04/2011  . Heartburn 12/03/2011  . Personal history of digestive disease 11/19/2011  . Depressive disorder 09/24/2011  . Dysuria 08/31/2011  . Impotence of organic origin  08/31/2011  . Urinary frequency 08/31/2011  . Constipation 06/05/2011  . Essential hypertension 06/05/2011  . Rectal cancer metastasized to lung [facility] 12/02/2010    Past Surgical History:  Procedure Laterality Date  . ABDOMINAL SURGERY  1990's   'for stomach ulcers" (04/17/2013)  . COLECTOMY  2012   "for rectal cancer" (04/17/2013)  . COLONOSCOPY  2013   Dr. Cheryll Cockayne: colorectal anastomosis with ulcer and inflammation, benign biopsy  . COLONOSCOPY WITH PROPOFOL N/A 09/07/2016   Procedure: COLONOSCOPY WITH PROPOFOL;  Surgeon: Daneil Dolin, MD;  [facility];  Service: Endoscopy;  Laterality: N/A;  10:00 am Colonoscopy via rectum and ostomy  . COLOSTOMY TAKEDOWN  2013  . CORONARY ANGIOPLASTY WITH STENT PLACEMENT  04/17/2013   "?1" (04/17/2013)  . FEMORAL-POPLITEAL BYPASS GRAFT Left 07/02/2015   Procedure: BYPASS GRAFT LEFT COMMON FEMORAL ARTERY TO LEFT ABOVE KNEE POPLITEAL ARTERY - USING LEFT GREATER SAPPHENOUS VEIN;  Surgeon: Elam Dutch, MD;  Location: [place];  Service: Vascular;  Laterality: Left;  . INCISION AND DRAINAGE ABSCESS Left 06/05/2016   Procedure: INCISION AND DRAINAGE ABSCESS;  Surgeon: Aviva Signs, MD;  Location: [facility] ORS;  Service: General;  Laterality: Left;  . INCISION AND DRAINAGE PERIRECTAL ABSCESS Left 06/07/2016   Procedure: IRRIGATION AND DEBRIDEMENT LEFT BUTTOCK ABSCESS;  Surgeon: Greer Pickerel, MD;  Location: [place];  Service: General;  Laterality: Left;  . INGUINAL HERNIA REPAIR Bilateral 1990's  . LAPAROSCOPIC PARTIAL COLECTOMY N/A 06/11/2016   Procedure: LAPAROSCOPIC  OPEN COLOSTOMY;  Surgeon: [name], MD;  Location: [place];  Service: General;  Laterality: N/A;  . LEFT HEART CATHETERIZATION WITH CORONARY ANGIOGRAM N/A 04/17/2013   Procedure: LEFT HEART CATHETERIZATION WITH CORONARY ANGIOGRAM;  Surgeon: Peter M Martinique, MD;  Location: [facility] C[facility] LAB;  Service: Cardiovascular;  Laterality: N/A;  . PERCUTANEOUS STENT INTERVENTION  04/17/2013    Procedure: PERCUTANEOUS STENT INTERVENTION;  Surgeon: Peter M Martinique, MD;  Location: [facility] CATH LAB;  Service: Cardiovascular;;  . PERIPHERAL VASCULAR CATHETERIZATION N/A 06/14/2015   Procedure: Abdominal Aortogram;  Surgeon: Elam Dutch, MD;  Location: [place] CV LAB;  Service: Cardiovascular;  Laterality: N/A;  . PERIPHERAL VASCULAR CATHETERIZATION Bilateral 06/14/2015   Procedure: Lower Extremity Angiography;  Surgeon: Elam Dutch, MD;  Location: [place] CV LAB;  Service: Cardiovascular;  Laterality: Bilateral;  . PORTACATH PLACEMENT Left 06/05/2020   Procedure: INSERTION PORT-A-CATH;  Surgeon: Aviva Signs, MD;  Location: [facility];  Service: General;  Laterality: Left;  Marland Kitchen VEIN HARVEST Left 07/02/2015   Procedure: VEIN HARVEST - LEFT GREATER SAPPHENOUS VEIN;  Surgeon: Elam Dutch, MD;  Location: [facility] OR;  Service: Vascular;  Laterality: Left;       Family History  Problem Relation Age of Onset  . Cancer Mother   . Hypertension Mother   . Bleeding Disorder Brother     Social History   Tobacco Use  . Smoking status: Light Tobacco Smoker    Packs/day: 0.25  Years: 40.00    Pack years: 10.00    Types: Cigarettes    Start date: 03/14/1974  . Smokeless tobacco: Never Used  . Tobacco comment: 5-6 per day 06/12/15  Vaping Use  . Vaping Use: Never used  Substance Use Topics  . Alcohol use: Yes    Alcohol/week: 3.0 standard drinks    Types: 3 Cans of beer per week  . Drug use: Yes    Types: Marijuana    Comment: 2 days ago     Home Medications Prior to Admission medications   Medication Sig Start Date End Date Taking? Authorizing Provider  alum & mag hydroxide-simeth (MAALOX/MYLANTA) 200-200-20 MG/5 SUSP Take 1 tablespoon (64ml) by mouth before meals.  If 1 doesn't help, you can take 2 tablespoons 07/25/20   [name], MD  apixaban (ELIQUIS) 5 MG TABS tablet Take 1 tablet (5 mg total) by mouth 2 (two) times daily. 06/16/16   Domenic Polite, MD  aspirin  EC 81 MG tablet Take 81 mg by mouth daily.    [provider]  DURVALUMAB IV Inject into the vein every 14 (fourteen) days. 09/12/20   [provider]  HYDROcodone-acetaminophen (NORCO) 10-325 MG tablet Take 1 tablet by mouth every 4 (four) hours as needed. 12/19/20   [name], MD  isosorbide mononitrate (IMDUR) 30 MG 24 hr tablet TAKE 1/2 TABLET BY MOUTH ONCE DAILY. Patient taking differently: Take 15 mg by mouth daily. 12/04/15   Herminio Commons, MD  lidocaine (XYLOCAINE) 2 % solution  08/01/20   [provider]  lisinopril (ZESTRIL) 20 MG tablet Take 20 mg by mouth daily. 05/29/20   [provider]  meloxicam (MOBIC) 7.5 MG tablet Take by mouth. 06/22/19   [provider]  NITROSTAT 0.4 MG SL tablet PLACE 1 TABLET UNDER THE TONGUE AS NEEDED FOR CHEST PAIN UP TO 3 DOSES 10/06/16   Herminio Commons, MD  omeprazole (PRILOSEC) 40 MG capsule Take 40 mg by mouth daily. 07/20/16   [provider]  sucralfate (CARAFATE) 1 GM/10ML suspension Take 10 mLs (1 g total) by mouth every 3 (three) hours as needed. 08/14/20   [name], MD  traMADol (ULTRAM) 50 MG tablet Take 1 tablet (50 mg total) by mouth every 6 (six) hours as needed. 05/01/20   [name], MD  prochlorperazine (COMPAZINE) 10 MG tablet Take 1 tablet (10 mg total) by mouth every 6 (six) hours as needed (Nausea or vomiting). 06/26/20 08/16/20  [name], MD    Allergies    Tramadol and Darvocet [propoxyphene n-acetaminophen]  Review of Systems   Review of Systems  All other systems reviewed and are negative.   Physical Exam Updated Vital Signs BP 99/81   Pulse 100   Temp 98.6 F (37 C) (Oral)   Resp (!) 23   Ht 1.753 m (5\' 9" )   Wt 69.4 kg   SpO2 95%   BMI 22.59 kg/m   Physical Exam Vitals and nursing note reviewed.  Constitutional:      General: He is not in acute distress.    Appearance: He is well-developed.  HENT:     Head:  Normocephalic and atraumatic.     Nose: Nose normal. No congestion or rhinorrhea.     Mouth/Throat:     Pharynx: No oropharyngeal exudate.  Eyes:     General: No scleral icterus.       Right eye: No discharge.        Left eye: No discharge.  Conjunctiva/sclera: Conjunctivae normal.     Pupils: Pupils are equal, round, and reactive to light.  Neck:     Thyroid: No thyromegaly.     Vascular: No JVD.  Cardiovascular:     Rate and Rhythm: Tachycardia present. Rhythm irregular.     Heart sounds: Normal heart sounds. No murmur heard. No friction rub. No gallop.   Pulmonary:     Effort: Pulmonary effort is normal. No respiratory distress.     Breath sounds: Normal breath sounds. No wheezing or rales.  Abdominal:     General: Bowel sounds are normal. There is no distension.     Palpations: Abdomen is soft. There is no mass.     Tenderness: There is no abdominal tenderness.     Comments: Nontender abdomen, diverting ostomy present with liquid brown stool  Musculoskeletal:        General: No tenderness. Normal range of motion.     Cervical back: Normal range of motion and neck supple.     Right lower leg: No edema.     Left lower leg: No edema.  Lymphadenopathy:     Cervical: No cervical adenopathy.  Skin:    General: Skin is warm and dry.     Findings: No erythema or rash.  Neurological:     General: No focal deficit present.     Mental Status: He is alert.     Coordination: Coordination normal.  Psychiatric:        Behavior: Behavior normal.     ED Results / Procedures / Treatments   Labs (all labs ordered are listed, but only abnormal results are displayed) Labs Reviewed  CBC WITH DIFFERENTIAL/PLATELET - Abnormal; Notable for the following components:      Result Value   MCV 104.0 (*)    MCH 34.7 (*)    RDW 15.7 (*)    All other components within normal limits  COMPREHENSIVE METABOLIC PANEL - Abnormal; Notable for the following components:   Calcium 8.8 (*)     Albumin 3.1 (*)    All other components within normal limits  TROPONIN I (HIGH SENSITIVITY) - Abnormal; Notable for the following components:   Troponin I (High Sensitivity) 45 (*)    All other components within normal limits  TSH  TROPONIN I (HIGH SENSITIVITY)    EKG None  EKG showed rapid atrial fibrillation  Radiology CT Chest W Contrast  Result Date: 01/15/2021 CLINICAL DATA:  Non-small cell lung cancer diagnosed in 2021. Chemotherapy completed. Immunotherapy ongoing. EXAM: CT CHEST WITH CONTRAST TECHNIQUE: Multidetector CT imaging of the chest was performed during intravenous contrast administration. CONTRAST:  60mL OMNIPAQUE IOHEXOL 300 MG/ML  SOLN COMPARISON:  CT chest 11/18/2020 and 09/09/2020.  PET-CT 04/29/2020. FINDINGS: Cardiovascular: Although not performed as a pulmonary CTA, there is new ill-defined decreased enhancement within the segmental and subsegmental branches of the left lower lobe pulmonary artery (image 104/2), suspicious for pulmonary embolism, possibly subacute. Left subclavian Port-A-Cath extends to the mid SVC. There is atherosclerosis of the aorta, great vessels and coronary arteries. The heart size is normal. There is no pericardial effusion. Mediastinum/Nodes: There are no enlarged mediastinal, hilar or axillary lymph nodes.Small pretracheal and subcarinal lymph nodes are unchanged. The thyroid gland, trachea and esophagus demonstrate no significant findings. Lungs/Pleura: Stable small right pleural effusion and adjacent pleural thickening. Again demonstrated is moderate to severe centrilobular and paraseptal emphysema with mild diffuse central airway thickening. Progressive subpleural consolidation posteriorly in the right lower lobe, largely obscuring the previously demonstrated  mass on the lung windows. On the soft tissue windows, there is a thick walled nodular density in this area with probable peripheral enhancement which appears slightly larger, measuring 3.5 x  2.4 cm on image 101/2. Multiple additional pulmonary nodules bilaterally are unchanged, largest measuring 6 mm in the left lower lobe on image 122/4. No other enlarging nodules identified. Upper abdomen: Multiple new peripherally enhancing hepatic lesions highly suspicious for metastatic disease. Largest lesion is in the left hepatic lobe, measuring 2.5 x 2.5 cm on image 168/2. There are multiple smaller lesions, measuring 9 mm on image 150/2, 11 mm on image 157/2 and 15 mm on image 160/2. There is stable nodularity of the adrenal glands. Musculoskeletal/Chest wall: There is no chest wall mass or suspicious osseous finding. IMPRESSION: 1. Multiple new peripherally enhancing liver lesions highly suspicious for hepatic metastatic disease. 2. Enlarging, peripherally enhancing pulmonary lesion posteriorly in the right lower lobe, largely obscured by surrounding consolidation, but suspicious for local recurrence of lung cancer, especially in light of the abdominal findings. Follow-up PET-CT may be helpful for further staging. 3. Additional pulmonary nodules are unchanged. 4. Suspected new ill-defined filling defect within the segmental and subsegmental branches of the left lower lobe pulmonary artery, suspicious for pulmonary embolism, possibly subacute. 5. Coronary and Aortic Atherosclerosis (ICD10-I70.0). Emphysema (ICD10-J43.9). 6. Critical Value/emergent results were called by telephone at the time of interpretation on 01/15/2021 at 3:20 pm to provider [facility] , who verbally acknowledged these results. Electronically Signed   By: Richardean Sale M.D.   On: 01/15/2021 15:26   CT Angio Chest PE W and/or Wo Contrast  Result Date: 01/15/2021 CLINICAL DATA:  60 year old male with concern for pulmonary embolism. History of lung cancer. EXAM: CT ANGIOGRAPHY CHEST WITH CONTRAST TECHNIQUE: Multidetector CT imaging of the chest was performed using the standard protocol during bolus administration of intravenous  contrast. Multiplanar CT image reconstructions and MIPs were obtained to evaluate the vascular anatomy. CONTRAST:  72mL OMNIPAQUE IOHEXOL 350 MG/ML SOLN COMPARISON:  Chest CT dated 01/15/2021. FINDINGS: Cardiovascular: Mildly dilated right atrium with retrograde flow of contrast from the right atrium into the IVC suggestive of a degree of right heart dysfunction. No pericardial effusion. There is coronary vascular calcification. Mild atherosclerotic calcification of the thoracic aorta. Left lower lobe segmental pulmonary artery emboli as seen on the earlier CT. Additional linear and nonocclusive emboli in the segmental and subsegmental branches of the right lower lobe. No CT evidence of right heart dysfunction. Mediastinum/Nodes: Right hilar adenopathy as well as mildly enlarged subcarinal lymph node as seen on the earlier CT. The esophagus and the thyroid gland are grossly unremarkable. No mediastinal fluid collection. Lungs/Pleura: Background of severe emphysema. Small right pleural effusion with associated subsegmental compressive atelectasis of the right lower lobe. Focal area of subpleural consolidation in the right lower lobe (256/7) as seen on the earlier CT. Follow-up as per recommendation of earlier CT. There is a 6 mm nodule along the minor fissure (111/6). No pneumothorax. The central airways are patent. Upper Abdomen: Multiple hepatic hypodense lesions most consistent with metastatic disease. Postsurgical changes of bowel with right upper quadrant ostomy. Musculoskeletal: Degenerative changes of the spine. No acute osseous pathology. Left-sided Port-A-Cath with tip over central SVC. Review of the MIP images confirms the above findings. IMPRESSION: 1. Bilateral lower lobe segmental and subsegmental pulmonary artery emboli. No CT evidence of right heart dysfunction. 2. Small right pleural effusion with associated subsegmental compressive atelectasis of the right lower lobe. 3. Focal area of  subpleural  consolidation in the right lower lobe as seen on the earlier CT. Findings may be infectious in etiology or represent recurrence of malignancy. Follow-up as per recommendation of earlier chest CT. 4. Right hilar and subcarinal adenopathy as seen on the earlier CT. 5. Multiple hepatic metastatic disease. 6. Aortic Atherosclerosis (ICD10-I70.0) and Emphysema (ICD10-J43.9). These results were called by telephone at the time of interpretation on 01/15/2021 at 7:46 pm to provider [facility] , who verbally acknowledged these results. Electronically Signed   By: Anner Crete M.D.   On: 01/15/2021 19:51    Procedures .Critical Care Performed by: Noemi Chapel, MD Authorized by: Noemi Chapel, MD   Critical care provider statement:    Critical care time (minutes):  35   Critical care time was exclusive of:  Separately billable procedures and treating other patients and teaching time   Critical care was necessary to treat or prevent imminent or life-threatening deterioration of the following conditions:  Cardiac failure (Pulmonary Embolism)   Critical care was time spent personally by me on the following activities:  Blood draw for specimens, development of treatment plan with patient or surrogate, discussions with consultants, evaluation of patient's response to treatment, examination of patient, obtaining history from patient or surrogate, ordering and performing treatments and interventions, ordering and review of laboratory studies, ordering and review of radiographic studies, pulse oximetry, re-evaluation of patient's condition and review of old charts     Medications Ordered in ED Medications  diltiazem (CARDIZEM) 125 mg in dextrose 5% 125 mL (1 mg/mL) infusion (has no administration in time range)  diltiazem (CARDIZEM) injection 20 mg (20 mg Intravenous Given 01/15/21 1822)  iohexol (OMNIPAQUE) 350 MG/ML injection 75 mL (75 mLs Intravenous Contrast Given 01/15/21 1923)    ED Course  I have  reviewed the triage vital signs and the nursing notes.  Pertinent labs & imaging results that were available during my care of the patient were reviewed by me and considered in my medical decision making (see chart for details).  Clinical Course as of 01/15/21 2007  Wed Jan 15, 2021  1946 Per radiologist - there is a LLL PE - this is consistent with my interpretation - the RLL has some infiltrate as well [BM]    Clinical Course User Index [BM] Noemi Chapel, MD   MDM Rules/Calculators/A&P                          The patient has a history of atrial fibrillation and does appear to be in rapid ventricular rate with a pulse between 110 and 130.  Additionally he has had increasing shortness of breath and a possible abnormal CT scan.  CT angiogram will be ordered, as well as a troponin labs and an EKG.  The patient is agreeable to the plan  The patient's EKG shows afib  - rapid rate - cardizem and drip started -  I discussed the patient's care with Dr. Delton Coombes of the oncology service who recommends the patient be admitted, switched to Coumadin, rate control for A. fib, the patient's blood pressure is right around 929 systolic, no fever, according to the radiologist there is no signs of right heart strain with the bilateral pulmonary emboli.  Labs reveal no leukocytosis, no anemia, normal electrolytes and renal function, low albumin, troponin is 45.  The CT scan also revealed some small right pleural effusion with some compressive atelectasis, focal subpleural consolidation in the right lower lobe as seen  on the earlier CT from the day, infectious versus malignant process possibilities, multiple hepatic metastatic lesions.  Will discuss with the hospitalist for admission Final Clinical Impression(s) / ED Diagnoses Final diagnoses:  Multiple subsegmental pulmonary emboli without acute cor pulmonale ([place])  Atrial fibrillation with rapid ventricular response ([place])  Malignant neoplasm of lung,  unspecified laterality, unspecified part of lung ([place])      Noemi Chapel, MD 01/15/21 2007

## 2021-01-15 NOTE — H&P (Addendum)
History and Physical  DELMO MATTY JWJ:[id number] DOB: 06/09/1961 DOA: 01/15/2021  Referring physician: Noemi Chapel, MD PCP: [name], MD  Patient coming from: Home  Chief Complaint: Shortness of breath  HPI: Juan Wheeler is a 60 y.o. male with medical history significant for  colorectal cancer status post colectomy and diverting ostomy, CAD s/p DES to LCx, DVT, atrial fibrillation on Eliquis, GERD, hypertension and history of right-sided squamous cell lung cancer s/p chemotherapy and radiation therapy presents to the emergency department from Dr. Tomie China office due to suspicion for pulmonary embolism.  Patient followed up with Dr. Delton Coombes today to have a CT scan to evaluate his lungs for progression of lung cancer, this showed possible filling defect in one of his pulmonary arteries, he was sent to the ED for further evaluation. Patient complained of about 3-week onset of progressive exertional shortness of breath and chest.  Shortness of breath worsens with exertion and could barely walk across the room at home without being short of breath. This is quite unusual and different from his baseline functioning since last month. He denies fever, chills, nausea, vomiting or abdominal pain.  ED Course:  In the emergency department, he was tachypneic, but otherwise vital signs were within normal range.  Work-up in the ED showed normal CBC except for elevated MCV at 104 and normal BMP.  Troponin x2- 45 > 56, TSH 0.882, influenza A, B and SARS coronavirus 2 was negative. CT chest with contrast was suspicious for hepatic metastasis disease and local recurrence of lung cancer.  CT findings was also suspicious for pulmonary embolism. CT angiography of chest with contrast showed bilateral lower lobe segmental and subsegmental pulmonary artery emboli with no evidence of right heart dysfunction.  Small right pleural effusion with associated subsegmental compressive atelectasis of the right  lower lobe also noted. He was started on IV Cardizem drip and Coumadin and Lovenox were started due to PE.  Hospitalist was asked to admit patient for further evaluation and management.   Review of Systems: Constitutional: Negative for chills and fever.  HENT: Negative for ear pain and sore throat.   Eyes: Negative for pain and visual disturbance.  Respiratory: Positive for cough with occasional yellowish mucus and shortness of breath Cardiovascular: Negative for chest pain and palpitations.  Gastrointestinal: Negative for abdominal pain and vomiting.  Endocrine: Negative for polyphagia and polyuria.  Genitourinary: Negative for decreased urine volume, dysuria, enuresis Musculoskeletal: Negative for arthralgias and back pain.  Skin: Negative for color change and rash.  Allergic/Immunologic: Negative for immunocompromised state.  Neurological: Negative for tremors, syncope, speech difficulty Hematological: Does not bruise/bleed easily.  All other systems reviewed and are negative  Past Medical History:  Diagnosis Date  . Chronic lower back pain    a. Followed by pain management at [facility].  . Colon cancer [facility])    rectal cancer  . Coronary artery disease    a. 03/2013: abnl nuc -> LHC s/p DES to LCx, residual moderate disease in LAD (med rx unless refractory angina). b. Not on BB due to bradycardia.  Marland Kitchen DVT (deep venous thrombosis) ([place]) ~ 2013  . Dysrhythmia    AFib  . GERD (gastroesophageal reflux disease)   . H/O necrotising fasciitis   . History of blood transfusion    "once; after throwing up alot of blood" (04/17/2013)  . Hypertension   . LV dysfunction    a. EF 45% in 03/2013.  Marland Kitchen PAD (peripheral artery disease) ([place])    a.  Occlusion of the right internal iliac artery, with significant atherosclerosis in the left internal iliac which was not amenable to reconstruction per notes from [place] in place 05/30/2020  . Pulmonary nodules 09/30/2014  . Rectal  cancer ([place])   . Tobacco abuse    Past Surgical History:  Procedure Laterality Date  . ABDOMINAL SURGERY  1990's   'for stomach ulcers" (04/17/2013)  . COLECTOMY  2012   "for rectal cancer" (04/17/2013)  . COLONOSCOPY  2013   Dr. Cheryll Cockayne: colorectal anastomosis with ulcer and inflammation, benign biopsy  . COLONOSCOPY WITH PROPOFOL N/A 09/07/2016   Procedure: COLONOSCOPY WITH PROPOFOL;  Surgeon: Daneil Dolin, MD;  [facility];  Service: Endoscopy;  Laterality: N/A;  10:00 am Colonoscopy via rectum and ostomy  . COLOSTOMY TAKEDOWN  2013  . CORONARY ANGIOPLASTY WITH STENT PLACEMENT  04/17/2013   "?1" (04/17/2013)  . FEMORAL-POPLITEAL BYPASS GRAFT Left 07/02/2015   Procedure: BYPASS GRAFT LEFT COMMON FEMORAL ARTERY TO LEFT ABOVE KNEE POPLITEAL ARTERY - USING LEFT GREATER SAPPHENOUS VEIN;  Surgeon: Elam Dutch, MD;  Location: [place];  Service: Vascular;  Laterality: Left;  . INCISION AND DRAINAGE ABSCESS Left 06/05/2016   Procedure: INCISION AND DRAINAGE ABSCESS;  Surgeon: Aviva Signs, MD;  Location: [facility] ORS;  Service: General;  Laterality: Left;  . INCISION AND DRAINAGE PERIRECTAL ABSCESS Left 06/07/2016   Procedure: IRRIGATION AND DEBRIDEMENT LEFT BUTTOCK ABSCESS;  Surgeon: Greer Pickerel, MD;  Location: [place];  Service: General;  Laterality: Left;  . INGUINAL HERNIA REPAIR Bilateral 1990's  . LAPAROSCOPIC PARTIAL COLECTOMY N/A 06/11/2016   Procedure: LAPAROSCOPIC  OPEN COLOSTOMY;  Surgeon: [name], MD;  Location: [place];  Service: General;  Laterality: N/A;  . LEFT HEART CATHETERIZATION WITH CORONARY ANGIOGRAM N/A 04/17/2013   Procedure: LEFT HEART CATHETERIZATION WITH CORONARY ANGIOGRAM;  Surgeon: Peter M Martinique, MD;  Location: [facility] CATH LAB;  Service: Cardiovascular;  Laterality: N/A;  . PERCUTANEOUS STENT INTERVENTION  04/17/2013   Procedure: PERCUTANEOUS STENT INTERVENTION;  Surgeon: Peter M Martinique, MD;  Location: [facility] CATH LAB;  Service: Cardiovascular;;  .  PERIPHERAL VASCULAR CATHETERIZATION N/A 06/14/2015   Procedure: Abdominal Aortogram;  Surgeon: Elam Dutch, MD;  Location: [place] CV LAB;  Service: Cardiovascular;  Laterality: N/A;  . PERIPHERAL VASCULAR CATHETERIZATION Bilateral 06/14/2015   Procedure: Lower Extremity Angiography;  Surgeon: Elam Dutch, MD;  Location: [place] CV LAB;  Service: Cardiovascular;  Laterality: Bilateral;  . PORTACATH PLACEMENT Left 06/05/2020   Procedure: INSERTION PORT-A-CATH;  Surgeon: Aviva Signs, MD;  Location: [facility];  Service: General;  Laterality: Left;  Marland Kitchen VEIN HARVEST Left 07/02/2015   Procedure: VEIN HARVEST - LEFT GREATER SAPPHENOUS VEIN;  Surgeon: Elam Dutch, MD;  Location: [place];  Service: Vascular;  Laterality: Left;    Social History:  reports that he has been smoking cigarettes. He started smoking about 46 years ago. He has a 10.00 pack-year smoking history. He has never used smokeless tobacco. He reports current alcohol use of about 3.0 standard drinks of alcohol per week. He reports current drug use. Drug: Marijuana.   Allergies  Allergen Reactions  . Tramadol     Felt sluggish and ineffective   . Darvocet [Propoxyphene N-Acetaminophen] Palpitations    Family History  Problem Relation Age of Onset  . Cancer Mother   . Hypertension Mother   . Bleeding Disorder Brother     Prior to Admission medications   Medication Sig Start Date  End Date Taking? Authorizing Provider  alum & mag hydroxide-simeth (MAALOX/MYLANTA) 200-200-20 MG/5 SUSP Take 1 tablespoon (35ml) by mouth before meals.  If 1 doesn't help, you can take 2 tablespoons 07/25/20   [name], MD  apixaban (ELIQUIS) 5 MG TABS tablet Take 1 tablet (5 mg total) by mouth 2 (two) times daily. 06/16/16   Domenic Polite, MD  aspirin EC 81 MG tablet Take 81 mg by mouth daily.    [provider]  DURVALUMAB IV Inject into the vein every 14 (fourteen) days. 09/12/20   [provider]   HYDROcodone-acetaminophen (NORCO) 10-325 MG tablet Take 1 tablet by mouth every 4 (four) hours as needed. 12/19/20   [name], MD  isosorbide mononitrate (IMDUR) 30 MG 24 hr tablet TAKE 1/2 TABLET BY MOUTH ONCE DAILY. Patient taking differently: Take 15 mg by mouth daily. 12/04/15   Herminio Commons, MD  lidocaine (XYLOCAINE) 2 % solution  08/01/20   [provider]  lisinopril (ZESTRIL) 20 MG tablet Take 20 mg by mouth daily. 05/29/20   [provider]  meloxicam (MOBIC) 7.5 MG tablet Take by mouth. 06/22/19   [provider]  NITROSTAT 0.4 MG SL tablet PLACE 1 TABLET UNDER THE TONGUE AS NEEDED FOR CHEST PAIN UP TO 3 DOSES 10/06/16   Herminio Commons, MD  omeprazole (PRILOSEC) 40 MG capsule Take 40 mg by mouth daily. 07/20/16   [provider]  sucralfate (CARAFATE) 1 GM/10ML suspension Take 10 mLs (1 g total) by mouth every 3 (three) hours as needed. 08/14/20   [name], MD  traMADol (ULTRAM) 50 MG tablet Take 1 tablet (50 mg total) by mouth every 6 (six) hours as needed. 05/01/20   [name], MD  prochlorperazine (COMPAZINE) 10 MG tablet Take 1 tablet (10 mg total) by mouth every 6 (six) hours as needed (Nausea or vomiting). 06/26/20 08/16/20  [name], MD    Physical Exam: BP 106/76   Pulse 88   Temp 98.6 F (37 C) (Oral)   Resp (!) 21   Ht 5\' 9"  (1.753 m)   Wt 69.4 kg   SpO2 91%   BMI 22.59 kg/m   . General: 60 y.o. year-old male well developed  in no acute distress.  Alert and oriented x3. Marland Kitchen HEENT: NCAT, EOMI . Neck: Supple, trachea medial . Cardiovascular: Tachycardia, irregular rate and rhythm with no rubs or gallops.  No thyromegaly or JVD noted.  No lower extremity edema. 2/4 pulses in all 4 extremities. Marland Kitchen Respiratory: Clear to auscultation with no wheezes or rales. . Abdomen: Soft nontender, diverting ostomy with liquid brown stool noted.  Nondistended with normal bowel sounds x4  quadrants. . Muskuloskeletal: Left-sided Port-A-Cath noted.  No cyanosis or edema noted bilaterally . Neuro: CN II-XII intact, strength 5 x 5/4, no focal neurologic deficit . Skin: No ulcerative lesions noted or rashes . Psychiatry: Judgement and insight appear normal. Mood is appropriate for condition and setting          Labs on Admission:  Basic Metabolic Panel: Recent Labs  Lab 01/15/21 1820  NA 136  K 4.0  CL 104  CO2 24  GLUCOSE 91  BUN 10  CREATININE 1.16  CALCIUM 8.8*   Liver Function Tests: Recent Labs  Lab 01/15/21 1820  AST 20  ALT 18  ALKPHOS 68  BILITOT 1.0  PROT 7.2  ALBUMIN 3.1*   No results for input(s): LIPASE, AMYLASE in the last 168 hours. No results for input(s): AMMONIA  in the last 168 hours. CBC: Recent Labs  Lab 01/15/21 1820  WBC 5.2  NEUTROABS 3.9  HGB 16.7  HCT 50.0  MCV 104.0*  PLT 154   Cardiac Enzymes: No results for input(s): CKTOTAL, CKMB, CKMBINDEX, TROPONINI in the last 168 hours.  BNP (last 3 results) No results for input(s): BNP in the last 8760 hours.  ProBNP (last 3 results) No results for input(s): PROBNP in the last 8760 hours.  CBG: No results for input(s): GLUCAP in the last 168 hours.  Radiological Exams on Admission: CT Chest W Contrast  Result Date: 01/15/2021 CLINICAL DATA:  Non-small cell lung cancer diagnosed in 2021. Chemotherapy completed. Immunotherapy ongoing. EXAM: CT CHEST WITH CONTRAST TECHNIQUE: Multidetector CT imaging of the chest was performed during intravenous contrast administration. CONTRAST:  73mL OMNIPAQUE IOHEXOL 300 MG/ML  SOLN COMPARISON:  CT chest 11/18/2020 and 09/09/2020.  PET-CT 04/29/2020. FINDINGS: Cardiovascular: Although not performed as a pulmonary CTA, there is new ill-defined decreased enhancement within the segmental and subsegmental branches of the left lower lobe pulmonary artery (image 104/2), suspicious for pulmonary embolism, possibly subacute. Left subclavian Port-A-Cath  extends to the mid SVC. There is atherosclerosis of the aorta, great vessels and coronary arteries. The heart size is normal. There is no pericardial effusion. Mediastinum/Nodes: There are no enlarged mediastinal, hilar or axillary lymph nodes.Small pretracheal and subcarinal lymph nodes are unchanged. The thyroid gland, trachea and esophagus demonstrate no significant findings. Lungs/Pleura: Stable small right pleural effusion and adjacent pleural thickening. Again demonstrated is moderate to severe centrilobular and paraseptal emphysema with mild diffuse central airway thickening. Progressive subpleural consolidation posteriorly in the right lower lobe, largely obscuring the previously demonstrated mass on the lung windows. On the soft tissue windows, there is a thick walled nodular density in this area with probable peripheral enhancement which appears slightly larger, measuring 3.5 x 2.4 cm on image 101/2. Multiple additional pulmonary nodules bilaterally are unchanged, largest measuring 6 mm in the left lower lobe on image 122/4. No other enlarging nodules identified. Upper abdomen: Multiple new peripherally enhancing hepatic lesions highly suspicious for metastatic disease. Largest lesion is in the left hepatic lobe, measuring 2.5 x 2.5 cm on image 168/2. There are multiple smaller lesions, measuring 9 mm on image 150/2, 11 mm on image 157/2 and 15 mm on image 160/2. There is stable nodularity of the adrenal glands. Musculoskeletal/Chest wall: There is no chest wall mass or suspicious osseous finding. IMPRESSION: 1. Multiple new peripherally enhancing liver lesions highly suspicious for hepatic metastatic disease. 2. Enlarging, peripherally enhancing pulmonary lesion posteriorly in the right lower lobe, largely obscured by surrounding consolidation, but suspicious for local recurrence of lung cancer, especially in light of the abdominal findings. Follow-up PET-CT may be helpful for further staging. 3.  Additional pulmonary nodules are unchanged. 4. Suspected new ill-defined filling defect within the segmental and subsegmental branches of the left lower lobe pulmonary artery, suspicious for pulmonary embolism, possibly subacute. 5. Coronary and Aortic Atherosclerosis (ICD10-I70.0). Emphysema (ICD10-J43.9). 6. Critical Value/emergent results were called by telephone at the time of interpretation on 01/15/2021 at 3:20 pm to provider [facility] , who verbally acknowledged these results. Electronically Signed   By: Richardean Sale M.D.   On: 01/15/2021 15:26   CT Angio Chest PE W and/or Wo Contrast  Result Date: 01/15/2021 CLINICAL DATA:  60 year old male with concern for pulmonary embolism. History of lung cancer. EXAM: CT ANGIOGRAPHY CHEST WITH CONTRAST TECHNIQUE: Multidetector CT imaging of the chest was performed using the standard  protocol during bolus administration of intravenous contrast. Multiplanar CT image reconstructions and MIPs were obtained to evaluate the vascular anatomy. CONTRAST:  65mL OMNIPAQUE IOHEXOL 350 MG/ML SOLN COMPARISON:  Chest CT dated 01/15/2021. FINDINGS: Cardiovascular: Mildly dilated right atrium with retrograde flow of contrast from the right atrium into the IVC suggestive of a degree of right heart dysfunction. No pericardial effusion. There is coronary vascular calcification. Mild atherosclerotic calcification of the thoracic aorta. Left lower lobe segmental pulmonary artery emboli as seen on the earlier CT. Additional linear and nonocclusive emboli in the segmental and subsegmental branches of the right lower lobe. No CT evidence of right heart dysfunction. Mediastinum/Nodes: Right hilar adenopathy as well as mildly enlarged subcarinal lymph node as seen on the earlier CT. The esophagus and the thyroid gland are grossly unremarkable. No mediastinal fluid collection. Lungs/Pleura: Background of severe emphysema. Small right pleural effusion with associated subsegmental  compressive atelectasis of the right lower lobe. Focal area of subpleural consolidation in the right lower lobe (256/7) as seen on the earlier CT. Follow-up as per recommendation of earlier CT. There is a 6 mm nodule along the minor fissure (111/6). No pneumothorax. The central airways are patent. Upper Abdomen: Multiple hepatic hypodense lesions most consistent with metastatic disease. Postsurgical changes of bowel with right upper quadrant ostomy. Musculoskeletal: Degenerative changes of the spine. No acute osseous pathology. Left-sided Port-A-Cath with tip over central SVC. Review of the MIP images confirms the above findings. IMPRESSION: 1. Bilateral lower lobe segmental and subsegmental pulmonary artery emboli. No CT evidence of right heart dysfunction. 2. Small right pleural effusion with associated subsegmental compressive atelectasis of the right lower lobe. 3. Focal area of subpleural consolidation in the right lower lobe as seen on the earlier CT. Findings may be infectious in etiology or represent recurrence of malignancy. Follow-up as per recommendation of earlier chest CT. 4. Right hilar and subcarinal adenopathy as seen on the earlier CT. 5. Multiple hepatic metastatic disease. 6. Aortic Atherosclerosis (ICD10-I70.0) and Emphysema (ICD10-J43.9). These results were called by telephone at the time of interpretation on 01/15/2021 at 7:46 pm to provider [facility] , who verbally acknowledged these results. Electronically Signed   By: Anner Crete M.D.   On: 01/15/2021 19:51    EKG: I independently viewed the EKG done and my findings are as followed: A. fib with RVR  Assessment/Plan Present on Admission: . Pulmonary embolism ([place]) . Rectal cancer metastasized to lung ([place]) . Squamous cell lung cancer, right ([place]) . Essential hypertension . Gastroesophageal reflux disease  Principal Problem:   Pulmonary embolism ([facility]) Active Problems:   Essential hypertension   Rectal cancer  metastasized to lung ([facility])   Gastroesophageal reflux disease   Squamous cell lung cancer, right (H[facility])   Hepatic metastasis (H[facility])   Elevated troponin I level   Pleural effusion on right   Hypoalbuminemia due to protein-calorie malnutrition ([facility])   Elevated MCV   Pulmonary embolism Chest showed bilateral lower lobe segmental and subsegmental pulmonary artery emboli.  No CT evidence of right heart dysfunction Patient was started on Lovenox and Coumadin Echocardiogram will be checked in the morning Continue to monitor INR  History of rectal cancer metastasized to lung Squamous cell lung cancer, right Hepatic metastatic disease Patient is undergoing chemotherapy with durvalumab q. 14 days for lung cancer He follows with Dr. Delton Coombes  Elevated troponin possibly secondary to type II demand ischemia High-sensitivity troponin- 45 > 56.  Patient denies chest pain, continue to trend troponin level  Right pleural  effusion r/o CHF CT angiography of chest showed focal area of subpleural consolidation in the right lower lobe with findings suggestive of infectious in etiology or recurrence of malignancy. Patient denies fever, chills, WBC was 5.2, procalcitonin will be done to rule out infectious etiology Patient does not appear fluid overloaded and no peripheral edema.  Echocardiogram will be done in the morning Continue total input/output, daily weights and fluid restriction Continue Cardiac diet   Elevated MCV MCV 104.0, vitamin B12 and folate levels will be checked  Hypoalbuminemia possibly secondary to mild protein calorie malnutrition Protein supplement to be provided  GERD Continue Protonix  Essential hypertension Continue IV Cardizem drip Hold off on oral BP meds at this time  DVT prophylaxis: Lovenox, Coumadin  Code Status: Full code  Family Communication: None at bedside  Disposition Plan:  Patient is from:                        home Anticipated DC to:                    SNF or family members home Anticipated DC date:               2-3 days Anticipated DC barriers:           Patient requires inpatient management due to newly diagnosed pulmonary embolus pending further work-up.  Consults called: None  Admission status: Inpatient   Bernadette Hoit MD [facility]  01/16/2021, 12:55 AM

## 2021-01-15 NOTE — Progress Notes (Signed)
ANTICOAGULATION CONSULT NOTE - Initial Consult  Pharmacy Consult for warfarin Indication: pulmonary embolus  Allergies  Allergen Reactions  . Tramadol     Felt sluggish and ineffective   . Darvocet [Propoxyphene N-Acetaminophen] Palpitations    Patient Measurements: Height: 5\' 9"  (175.3 cm) Weight: 69.4 kg (153 lb) IBW/kg (Calculated) : 70.7  Vital Signs: Temp: 98.6 F (37 C) (04/20 1715) Temp Source: Oral (04/20 1715) BP: 107/82 (04/20 2011) Pulse Rate: 103 (04/20 2011)  Labs: Recent Labs    01/15/21 1820  HGB 16.7  HCT 50.0  PLT 154  CREATININE 1.16  TROPONINIHS 45*    Estimated Creatinine Clearance: 66.5 mL/min (by C-G formula based on SCr of 1.16 mg/dL).   AST/ALT WNL   Medical History: Past Medical History:  Diagnosis Date  . Chronic lower back pain    a. Followed by pain management at [facility].  . Colon cancer [facility])    rectal cancer  . Coronary artery disease    a. 03/2013: abnl nuc -> LHC s/p DES to LCx, residual moderate disease in LAD (med rx unless refractory angina). b. Not on BB due to bradycardia.  Marland Kitchen DVT (deep venous thrombosis) ([place]) ~ 2013  . Dysrhythmia    AFib  . GERD (gastroesophageal reflux disease)   . H/O necrotising fasciitis   . History of blood transfusion    "once; after throwing up alot of blood" (04/17/2013)  . Hypertension   . LV dysfunction    a. EF 45% in 03/2013.  Marland Kitchen PAD (peripheral artery disease) ([place])    a. Occlusion of the right internal iliac artery, with significant atherosclerosis in the left internal iliac which was not amenable to reconstruction per notes from [place] in place 05/30/2020  . Pulmonary nodules 09/30/2014  . Rectal cancer ([place])   . Tobacco abuse     Medications:  Prior to admission, patient was on apixaban 5 mg po BID for history of DVT and afib.  Assessment: Pharmacy consulted to dose warfarin for this patient with PE while on apixaban. Apixaban stopped today and last dose was  this morning (01/15/2021).  Estimated creatinine clearance is greater than 30 ml/min.  Goal of Therapy:  Goal INR 2-3 Monitor platelets by anticoagulation protocol: Yes   Plan:  Enoxaparin 1 mg/kg subq every 12 hours until INR therapeutic per provider order Warfarin 7.5 mg po x 1 this evening Monitor daily INR for warfarin dose and stop enoxaparin when INR therapeutic, CBC, s/s bleeding   Efraim Kaufmann, PharmD, BCPS 01/15/2021,8:38 PM

## 2021-01-15 NOTE — H&P (Incomplete)
History and Physical  Juan Wheeler XBM:[id number] DOB: 18-Feb-1961 DOA: 01/15/2021  Referring physician: Noemi Chapel, MD PCP: [name], MD  Patient coming from: Home  Chief Complaint: Shortness of breath  HPI: Juan Wheeler is a 60 y.o. male with medical history significant for  colorectal cancer status post colectomy and diverting ostomy, CAD s/p DES to LCx, DVT, atrial fibrillation on Eliquis, GERD, hypertension and history of right-sided squamous cell lung cancer s/p chemotherapy and radiation therapy presents to the emergency department from Dr. Tomie China office due to suspicion for pulmonary embolism.  Patient followed up with Dr. Delton Coombes today to have a CT scan to evaluate his lungs for progression of lung cancer, this showed possible filling defect in one of his pulmonary arteries, he was sent to the ED for further evaluation. Patient complained of about 3-week onset of progressive exertional shortness of breath and chest.  Shortness of breath worsens with exertion and could barely walk across the room at home without being short of breath. This is quite unusual and different from his baseline functioning since last month. He denies fever, chills, nausea, vomiting or abdominal pain.  ED Course:  In the emergency department, he was tachypneic, but otherwise vital signs were within normal range.  Work-up in the ED showed normal CBC except for elevated MCV at 104 and normal BMP.  Troponin x2- 45 > 56, TSH 0.882, influenza A, B and SARS coronavirus 2 was negative. CT chest with contrast was suspicious for hepatic metastasis disease and local recurrence of lung cancer.  CT findings was also suspicious for pulmonary embolism. CT angiography of chest with contrast showed bilateral lower lobe segmental and subsegmental pulmonary artery emboli with no evidence of right heart dysfunction.  Small right pleural effusion with associated subsegmental compressive atelectasis of the right  lower lobe also noted. He was started on IV Cardizem drip and Coumadin and Lovenox were started due to PE.  Hospitalist was asked to admit patient for further evaluation and management.   Review of Systems: Constitutional: Negative for chills and fever.  HENT: Negative for ear pain and sore throat.   Eyes: Negative for pain and visual disturbance.  Respiratory: Positive for cough with occasional yellowish mucus and shortness of breath Cardiovascular: Negative for chest pain and palpitations.  Gastrointestinal: Negative for abdominal pain and vomiting.  Endocrine: Negative for polyphagia and polyuria.  Genitourinary: Negative for decreased urine volume, dysuria, enuresis Musculoskeletal: Negative for arthralgias and back pain.  Skin: Negative for color change and rash.  Allergic/Immunologic: Negative for immunocompromised state.  Neurological: Negative for tremors, syncope, speech difficulty Hematological: Does not bruise/bleed easily.  All other systems reviewed and are negative  Past Medical History:  Diagnosis Date  . Chronic lower back pain    a. Followed by pain management at [facility].  . Colon cancer [facility])    rectal cancer  . Coronary artery disease    a. 03/2013: abnl nuc -> LHC s/p DES to LCx, residual moderate disease in LAD (med rx unless refractory angina). b. Not on BB due to bradycardia.  Marland Kitchen DVT (deep venous thrombosis) ([place]) ~ 2013  . Dysrhythmia    AFib  . GERD (gastroesophageal reflux disease)   . H/O necrotising fasciitis   . History of blood transfusion    "once; after throwing up alot of blood" (04/17/2013)  . Hypertension   . LV dysfunction    a. EF 45% in 03/2013.  Marland Kitchen PAD (peripheral artery disease) ([place])    a.  Occlusion of the right internal iliac artery, with significant atherosclerosis in the left internal iliac which was not amenable to reconstruction per notes from [place] in place 05/30/2020  . Pulmonary nodules 09/30/2014  . Rectal  cancer ([place])   . Tobacco abuse    Past Surgical History:  Procedure Laterality Date  . ABDOMINAL SURGERY  1990's   'for stomach ulcers" (04/17/2013)  . COLECTOMY  2012   "for rectal cancer" (04/17/2013)  . COLONOSCOPY  2013   Dr. Cheryll Cockayne: colorectal anastomosis with ulcer and inflammation, benign biopsy  . COLONOSCOPY WITH PROPOFOL N/A 09/07/2016   Procedure: COLONOSCOPY WITH PROPOFOL;  Surgeon: Daneil Dolin, MD;  [facility];  Service: Endoscopy;  Laterality: N/A;  10:00 am Colonoscopy via rectum and ostomy  . COLOSTOMY TAKEDOWN  2013  . CORONARY ANGIOPLASTY WITH STENT PLACEMENT  04/17/2013   "?1" (04/17/2013)  . FEMORAL-POPLITEAL BYPASS GRAFT Left 07/02/2015   Procedure: BYPASS GRAFT LEFT COMMON FEMORAL ARTERY TO LEFT ABOVE KNEE POPLITEAL ARTERY - USING LEFT GREATER SAPPHENOUS VEIN;  Surgeon: Elam Dutch, MD;  Location: [place];  Service: Vascular;  Laterality: Left;  . INCISION AND DRAINAGE ABSCESS Left 06/05/2016   Procedure: INCISION AND DRAINAGE ABSCESS;  Surgeon: Aviva Signs, MD;  Location: [facility] ORS;  Service: General;  Laterality: Left;  . INCISION AND DRAINAGE PERIRECTAL ABSCESS Left 06/07/2016   Procedure: IRRIGATION AND DEBRIDEMENT LEFT BUTTOCK ABSCESS;  Surgeon: Greer Pickerel, MD;  Location: [place];  Service: General;  Laterality: Left;  . INGUINAL HERNIA REPAIR Bilateral 1990's  . LAPAROSCOPIC PARTIAL COLECTOMY N/A 06/11/2016   Procedure: LAPAROSCOPIC  OPEN COLOSTOMY;  Surgeon: [name], MD;  Location: [place];  Service: General;  Laterality: N/A;  . LEFT HEART CATHETERIZATION WITH CORONARY ANGIOGRAM N/A 04/17/2013   Procedure: LEFT HEART CATHETERIZATION WITH CORONARY ANGIOGRAM;  Surgeon: Peter M Martinique, MD;  Location: [facility] CATH LAB;  Service: Cardiovascular;  Laterality: N/A;  . PERCUTANEOUS STENT INTERVENTION  04/17/2013   Procedure: PERCUTANEOUS STENT INTERVENTION;  Surgeon: Peter M Martinique, MD;  Location: [facility] CATH LAB;  Service: Cardiovascular;;  .  PERIPHERAL VASCULAR CATHETERIZATION N/A 06/14/2015   Procedure: Abdominal Aortogram;  Surgeon: Elam Dutch, MD;  Location: [place] CV LAB;  Service: Cardiovascular;  Laterality: N/A;  . PERIPHERAL VASCULAR CATHETERIZATION Bilateral 06/14/2015   Procedure: Lower Extremity Angiography;  Surgeon: Elam Dutch, MD;  Location: [place] CV LAB;  Service: Cardiovascular;  Laterality: Bilateral;  . PORTACATH PLACEMENT Left 06/05/2020   Procedure: INSERTION PORT-A-CATH;  Surgeon: Aviva Signs, MD;  Location: [facility];  Service: General;  Laterality: Left;  Marland Kitchen VEIN HARVEST Left 07/02/2015   Procedure: VEIN HARVEST - LEFT GREATER SAPPHENOUS VEIN;  Surgeon: Elam Dutch, MD;  Location: [place];  Service: Vascular;  Laterality: Left;    Social History:  reports that he has been smoking cigarettes. He started smoking about 46 years ago. He has a 10.00 pack-year smoking history. He has never used smokeless tobacco. He reports current alcohol use of about 3.0 standard drinks of alcohol per week. He reports current drug use. Drug: Marijuana.   Allergies  Allergen Reactions  . Tramadol     Felt sluggish and ineffective   . Darvocet [Propoxyphene N-Acetaminophen] Palpitations    Family History  Problem Relation Age of Onset  . Cancer Mother   . Hypertension Mother   . Bleeding Disorder Brother     ***  Prior to Admission medications   Medication Sig  Start Date End Date Taking? Authorizing Provider  alum & mag hydroxide-simeth (MAALOX/MYLANTA) 200-200-20 MG/5 SUSP Take 1 tablespoon (91ml) by mouth before meals.  If 1 doesn't help, you can take 2 tablespoons 07/25/20   [name], MD  apixaban (ELIQUIS) 5 MG TABS tablet Take 1 tablet (5 mg total) by mouth 2 (two) times daily. 06/16/16   Domenic Polite, MD  aspirin EC 81 MG tablet Take 81 mg by mouth daily.    [provider]  DURVALUMAB IV Inject into the vein every 14 (fourteen) days. 09/12/20   [provider]   HYDROcodone-acetaminophen (NORCO) 10-325 MG tablet Take 1 tablet by mouth every 4 (four) hours as needed. 12/19/20   [name], MD  isosorbide mononitrate (IMDUR) 30 MG 24 hr tablet TAKE 1/2 TABLET BY MOUTH ONCE DAILY. Patient taking differently: Take 15 mg by mouth daily. 12/04/15   Herminio Commons, MD  lidocaine (XYLOCAINE) 2 % solution  08/01/20   [provider]  lisinopril (ZESTRIL) 20 MG tablet Take 20 mg by mouth daily. 05/29/20   [provider]  meloxicam (MOBIC) 7.5 MG tablet Take by mouth. 06/22/19   [provider]  NITROSTAT 0.4 MG SL tablet PLACE 1 TABLET UNDER THE TONGUE AS NEEDED FOR CHEST PAIN UP TO 3 DOSES 10/06/16   Herminio Commons, MD  omeprazole (PRILOSEC) 40 MG capsule Take 40 mg by mouth daily. 07/20/16   [provider]  sucralfate (CARAFATE) 1 GM/10ML suspension Take 10 mLs (1 g total) by mouth every 3 (three) hours as needed. 08/14/20   [name], MD  traMADol (ULTRAM) 50 MG tablet Take 1 tablet (50 mg total) by mouth every 6 (six) hours as needed. 05/01/20   [name], MD  prochlorperazine (COMPAZINE) 10 MG tablet Take 1 tablet (10 mg total) by mouth every 6 (six) hours as needed (Nausea or vomiting). 06/26/20 08/16/20  [name], MD    Physical Exam: BP 103/71   Pulse 83   Temp 98.6 F (37 C) (Oral)   Resp (!) 25   Ht 5\' 9"  (1.753 m)   Wt 69.4 kg   SpO2 90%   BMI 22.59 kg/m   . General: 59 y.o. year-old male well developed  in no acute distress.  Alert and oriented x3. Marland Kitchen HEENT: NCAT, EOMI . Neck: Supple, trachea medial . Cardiovascular: Tachycardia, irregular rate and rhythm with no rubs or gallops.  No thyromegaly or JVD noted.  No lower extremity edema. 2/4 pulses in all 4 extremities. Marland Kitchen Respiratory: Clear to auscultation with no wheezes or rales. . Abdomen: Soft nontender, diverting ostomy with liquid brown stool noted.  Nondistended with normal bowel sounds x4  quadrants. . Muskuloskeletal: No cyanosis, clubbing or edema noted bilaterally . Neuro: CN II-XII intact, strength 5 x 5/4, no focal neurologic deficit . Skin: No ulcerative lesions noted or rashes . Psychiatry: Judgement and insight appear normal. Mood is appropriate for condition and setting          Labs on Admission:  Basic Metabolic Panel: Recent Labs  Lab 01/15/21 1820  NA 136  K 4.0  CL 104  CO2 24  GLUCOSE 91  BUN 10  CREATININE 1.16  CALCIUM 8.8*   Liver Function Tests: Recent Labs  Lab 01/15/21 1820  AST 20  ALT 18  ALKPHOS 68  BILITOT 1.0  PROT 7.2  ALBUMIN 3.1*   No results for input(s): LIPASE, AMYLASE in the last 168 hours. No results for input(s): AMMONIA in  the last 168 hours. CBC: Recent Labs  Lab 01/15/21 1820  WBC 5.2  NEUTROABS 3.9  HGB 16.7  HCT 50.0  MCV 104.0*  PLT 154   Cardiac Enzymes: No results for input(s): CKTOTAL, CKMB, CKMBINDEX, TROPONINI in the last 168 hours.  BNP (last 3 results) No results for input(s): BNP in the last 8760 hours.  ProBNP (last 3 results) No results for input(s): PROBNP in the last 8760 hours.  CBG: No results for input(s): GLUCAP in the last 168 hours.  Radiological Exams on Admission: CT Chest W Contrast  Result Date: 01/15/2021 CLINICAL DATA:  Non-small cell lung cancer diagnosed in 2021. Chemotherapy completed. Immunotherapy ongoing. EXAM: CT CHEST WITH CONTRAST TECHNIQUE: Multidetector CT imaging of the chest was performed during intravenous contrast administration. CONTRAST:  31mL OMNIPAQUE IOHEXOL 300 MG/ML  SOLN COMPARISON:  CT chest 11/18/2020 and 09/09/2020.  PET-CT 04/29/2020. FINDINGS: Cardiovascular: Although not performed as a pulmonary CTA, there is new ill-defined decreased enhancement within the segmental and subsegmental branches of the left lower lobe pulmonary artery (image 104/2), suspicious for pulmonary embolism, possibly subacute. Left subclavian Port-A-Cath extends to the mid  SVC. There is atherosclerosis of the aorta, great vessels and coronary arteries. The heart size is normal. There is no pericardial effusion. Mediastinum/Nodes: There are no enlarged mediastinal, hilar or axillary lymph nodes.Small pretracheal and subcarinal lymph nodes are unchanged. The thyroid gland, trachea and esophagus demonstrate no significant findings. Lungs/Pleura: Stable small right pleural effusion and adjacent pleural thickening. Again demonstrated is moderate to severe centrilobular and paraseptal emphysema with mild diffuse central airway thickening. Progressive subpleural consolidation posteriorly in the right lower lobe, largely obscuring the previously demonstrated mass on the lung windows. On the soft tissue windows, there is a thick walled nodular density in this area with probable peripheral enhancement which appears slightly larger, measuring 3.5 x 2.4 cm on image 101/2. Multiple additional pulmonary nodules bilaterally are unchanged, largest measuring 6 mm in the left lower lobe on image 122/4. No other enlarging nodules identified. Upper abdomen: Multiple new peripherally enhancing hepatic lesions highly suspicious for metastatic disease. Largest lesion is in the left hepatic lobe, measuring 2.5 x 2.5 cm on image 168/2. There are multiple smaller lesions, measuring 9 mm on image 150/2, 11 mm on image 157/2 and 15 mm on image 160/2. There is stable nodularity of the adrenal glands. Musculoskeletal/Chest wall: There is no chest wall mass or suspicious osseous finding. IMPRESSION: 1. Multiple new peripherally enhancing liver lesions highly suspicious for hepatic metastatic disease. 2. Enlarging, peripherally enhancing pulmonary lesion posteriorly in the right lower lobe, largely obscured by surrounding consolidation, but suspicious for local recurrence of lung cancer, especially in light of the abdominal findings. Follow-up PET-CT may be helpful for further staging. 3. Additional pulmonary  nodules are unchanged. 4. Suspected new ill-defined filling defect within the segmental and subsegmental branches of the left lower lobe pulmonary artery, suspicious for pulmonary embolism, possibly subacute. 5. Coronary and Aortic Atherosclerosis (ICD10-I70.0). Emphysema (ICD10-J43.9). 6. Critical Value/emergent results were called by telephone at the time of interpretation on 01/15/2021 at 3:20 pm to provider [facility] , who verbally acknowledged these results. Electronically Signed   By: Richardean Sale M.D.   On: 01/15/2021 15:26   CT Angio Chest PE W and/or Wo Contrast  Result Date: 01/15/2021 CLINICAL DATA:  60 year old male with concern for pulmonary embolism. History of lung cancer. EXAM: CT ANGIOGRAPHY CHEST WITH CONTRAST TECHNIQUE: Multidetector CT imaging of the chest was performed using the standard protocol  during bolus administration of intravenous contrast. Multiplanar CT image reconstructions and MIPs were obtained to evaluate the vascular anatomy. CONTRAST:  48mL OMNIPAQUE IOHEXOL 350 MG/ML SOLN COMPARISON:  Chest CT dated 01/15/2021. FINDINGS: Cardiovascular: Mildly dilated right atrium with retrograde flow of contrast from the right atrium into the IVC suggestive of a degree of right heart dysfunction. No pericardial effusion. There is coronary vascular calcification. Mild atherosclerotic calcification of the thoracic aorta. Left lower lobe segmental pulmonary artery emboli as seen on the earlier CT. Additional linear and nonocclusive emboli in the segmental and subsegmental branches of the right lower lobe. No CT evidence of right heart dysfunction. Mediastinum/Nodes: Right hilar adenopathy as well as mildly enlarged subcarinal lymph node as seen on the earlier CT. The esophagus and the thyroid gland are grossly unremarkable. No mediastinal fluid collection. Lungs/Pleura: Background of severe emphysema. Small right pleural effusion with associated subsegmental compressive atelectasis  of the right lower lobe. Focal area of subpleural consolidation in the right lower lobe (256/7) as seen on the earlier CT. Follow-up as per recommendation of earlier CT. There is a 6 mm nodule along the minor fissure (111/6). No pneumothorax. The central airways are patent. Upper Abdomen: Multiple hepatic hypodense lesions most consistent with metastatic disease. Postsurgical changes of bowel with right upper quadrant ostomy. Musculoskeletal: Degenerative changes of the spine. No acute osseous pathology. Left-sided Port-A-Cath with tip over central SVC. Review of the MIP images confirms the above findings. IMPRESSION: 1. Bilateral lower lobe segmental and subsegmental pulmonary artery emboli. No CT evidence of right heart dysfunction. 2. Small right pleural effusion with associated subsegmental compressive atelectasis of the right lower lobe. 3. Focal area of subpleural consolidation in the right lower lobe as seen on the earlier CT. Findings may be infectious in etiology or represent recurrence of malignancy. Follow-up as per recommendation of earlier chest CT. 4. Right hilar and subcarinal adenopathy as seen on the earlier CT. 5. Multiple hepatic metastatic disease. 6. Aortic Atherosclerosis (ICD10-I70.0) and Emphysema (ICD10-J43.9). These results were called by telephone at the time of interpretation on 01/15/2021 at 7:46 pm to provider [facility] , who verbally acknowledged these results. Electronically Signed   By: Anner Crete M.D.   On: 01/15/2021 19:51    EKG: I independently viewed the EKG done and my findings are as followed: A. fib with RVR  Assessment/Plan Present on Admission: . Pulmonary embolism ([place]) . Rectal cancer metastasized to lung ([place]) . Squamous cell lung cancer, right ([place]) . Essential hypertension . Gastroesophageal reflux disease  Principal Problem:   Pulmonary embolism (H[facility]) Active Problems:   Essential hypertension   Rectal cancer metastasized to lung (HCC)    Gastroesophageal reflux disease   Squamous cell lung cancer, right (HCC)   Hepatic metastasis (HCC)   Elevated troponin I level   Pleural effusion on right   Pulmonary embolism Chest showed bilateral lower lobe segmental and subsegmental pulmonary artery emboli.  No CT evidence of right heart dysfunction Patient was started on Lovenox and Coumadin Echocardiogram will be checked in the morning Continue to monitor INR  History of rectal cancer metastasized to lung Squamous cell lung cancer, right Hepatic metastatic disease Patient is undergoing chemotherapy with durvalumab q. 14 days for lung cancer He follows with Dr. Delton Coombes  Elevated troponin possible secondary to type II demand ischemia High-sensitivity troponin- 45 > 56.  Patient denies chest pain, continue to trend troponin level  Right pleural effusion r/o CHF CT angiography of chest showed focal area of subpleural consolidation  in the right lower lobe with findings suggestive of infectious in etiology or recurrence of malignancy. Patient denies fever, chills, WBC    GERD  Essential hypertension     DVT prophylaxis: ***   Code Status: ***   Family Communication: ***   Disposition Plan: ***   Consults called: ***   Admission status: ***     Bernadette Hoit MD [facility] Pager [phone]  If 7PM-7AM, please contact night-coverage [email or web address] Password [facility]  01/15/2021, 11:58 PM

## 2021-01-15 NOTE — ED Notes (Signed)
Pt sats noted to be 89% on RA. Pt placed on 2L of O2.

## 2021-01-15 NOTE — ED Triage Notes (Signed)
Pt to er, pt states that he just went through treatment for lung cancer, states that his pmd just sent him here to have a ct of his lungs/chest.  Pt states that he is a little sob, but it is better if he sits down to rest.

## 2021-01-16 ENCOUNTER — Encounter (HOSPITAL_COMMUNITY): Payer: Self-pay | Admitting: Internal Medicine

## 2021-01-16 ENCOUNTER — Other Ambulatory Visit (HOSPITAL_COMMUNITY): Payer: Medicare PPO

## 2021-01-16 ENCOUNTER — Ambulatory Visit (HOSPITAL_COMMUNITY): Payer: Medicare PPO | Admitting: Hematology

## 2021-01-16 ENCOUNTER — Ambulatory Visit (HOSPITAL_COMMUNITY): Payer: Medicare PPO

## 2021-01-16 DIAGNOSIS — C3490 Malignant neoplasm of unspecified part of unspecified bronchus or lung: Secondary | ICD-10-CM

## 2021-01-16 DIAGNOSIS — I2699 Other pulmonary embolism without acute cor pulmonale: Secondary | ICD-10-CM

## 2021-01-16 DIAGNOSIS — R718 Other abnormality of red blood cells: Secondary | ICD-10-CM

## 2021-01-16 DIAGNOSIS — E8809 Other disorders of plasma-protein metabolism, not elsewhere classified: Secondary | ICD-10-CM

## 2021-01-16 DIAGNOSIS — I4891 Unspecified atrial fibrillation: Secondary | ICD-10-CM

## 2021-01-16 DIAGNOSIS — I2694 Multiple subsegmental pulmonary emboli without acute cor pulmonale: Principal | ICD-10-CM

## 2021-01-16 DIAGNOSIS — Z7189 Other specified counseling: Secondary | ICD-10-CM

## 2021-01-16 DIAGNOSIS — Z515 Encounter for palliative care: Secondary | ICD-10-CM

## 2021-01-16 LAB — CBC
HCT: 47 % (ref 39.0–52.0)
Hemoglobin: 15.6 g/dL (ref 13.0–17.0)
MCH: 34.3 pg — ABNORMAL HIGH (ref 26.0–34.0)
MCHC: 33.2 g/dL (ref 30.0–36.0)
MCV: 103.3 fL — ABNORMAL HIGH (ref 80.0–100.0)
Platelets: 145 10*3/uL — ABNORMAL LOW (ref 150–400)
RBC: 4.55 MIL/uL (ref 4.22–5.81)
RDW: 15.5 % (ref 11.5–15.5)
WBC: 5.8 10*3/uL (ref 4.0–10.5)
nRBC: 0 % (ref 0.0–0.2)

## 2021-01-16 LAB — VITAMIN B12: Vitamin B-12: 218 pg/mL (ref 180–914)

## 2021-01-16 LAB — COMPREHENSIVE METABOLIC PANEL
ALT: 17 U/L (ref 0–44)
AST: 19 U/L (ref 15–41)
Albumin: 2.9 g/dL — ABNORMAL LOW (ref 3.5–5.0)
Alkaline Phosphatase: 61 U/L (ref 38–126)
Anion gap: 7 (ref 5–15)
BUN: 9 mg/dL (ref 6–20)
CO2: 22 mmol/L (ref 22–32)
Calcium: 8.6 mg/dL — ABNORMAL LOW (ref 8.9–10.3)
Chloride: 107 mmol/L (ref 98–111)
Creatinine, Ser: 0.92 mg/dL (ref 0.61–1.24)
GFR, Estimated: >60 mL/min (ref >60)
Glucose, Bld: 90 mg/dL (ref 70–99)
Potassium: 4.1 mmol/L (ref 3.5–5.1)
Sodium: 136 mmol/L (ref 135–145)
Total Bilirubin: 1.2 mg/dL (ref 0.3–1.2)
Total Protein: 6.6 g/dL (ref 6.5–8.1)

## 2021-01-16 LAB — PROTIME-INR
INR: 1.1 (ref 0.8–1.2)
Prothrombin Time: 14 seconds (ref 11.4–15.2)

## 2021-01-16 LAB — MAGNESIUM: Magnesium: 1.9 mg/dL (ref 1.7–2.4)

## 2021-01-16 LAB — PHOSPHORUS: Phosphorus: 3.2 mg/dL (ref 2.5–4.6)

## 2021-01-16 LAB — HEPARIN LEVEL (UNFRACTIONATED)
Heparin Unfractionated: 0.47 IU/mL (ref 0.30–0.70)
Heparin Unfractionated: 0.45 IU/mL (ref 0.30–0.70)

## 2021-01-16 LAB — TROPONIN I (HIGH SENSITIVITY)
Troponin I (High Sensitivity): 54 ng/L — ABNORMAL HIGH (ref <18)
Troponin I (High Sensitivity): 56 ng/L — ABNORMAL HIGH (ref <18)

## 2021-01-16 LAB — PROCALCITONIN: Procalcitonin: <0.1 ng/mL

## 2021-01-16 LAB — FOLATE: Folate: 10.2 ng/mL (ref >5.9)

## 2021-01-16 LAB — BRAIN NATRIURETIC PEPTIDE: B Natriuretic Peptide: 364 pg/mL — ABNORMAL HIGH (ref 0.0–100.0)

## 2021-01-16 LAB — HIV ANTIBODY (ROUTINE TESTING W REFLEX): HIV Screen 4th Generation wRfx: NONREACTIVE

## 2021-01-16 LAB — APTT: aPTT: 56 seconds — ABNORMAL HIGH (ref 24–36)

## 2021-01-16 MED ORDER — ENSURE ENLIVE PO LIQD
237.0000 mL | Freq: Two times a day (BID) | ORAL | Status: DC
  Administered 2021-01-16: 237 mL via ORAL
  Filled 2021-01-16 (×4): qty 237

## 2021-01-16 MED ORDER — HEPARIN (PORCINE) 25000 UT/250ML-% IV SOLN
1150.0000 [IU]/h | INTRAVENOUS | Status: DC
  Administered 2021-01-16 – 2021-01-18 (×3): 1150 [IU]/h via INTRAVENOUS
  Filled 2021-01-16 (×3): qty 250

## 2021-01-16 MED ORDER — KETOROLAC TROMETHAMINE 15 MG/ML IJ SOLN
15.0000 mg | Freq: Once | INTRAMUSCULAR | Status: AC
  Administered 2021-01-16: 15 mg via INTRAVENOUS

## 2021-01-16 MED ORDER — HYDROCODONE-ACETAMINOPHEN 10-325 MG PO TABS
1.0000 | ORAL_TABLET | ORAL | Status: DC | PRN
  Administered 2021-01-16 – 2021-01-18 (×7): 1 via ORAL
  Filled 2021-01-16 (×7): qty 1

## 2021-01-16 MED ORDER — PANTOPRAZOLE SODIUM 40 MG PO TBEC
40.0000 mg | DELAYED_RELEASE_TABLET | Freq: Every day | ORAL | Status: DC
  Administered 2021-01-16 – 2021-01-18 (×3): 40 mg via ORAL
  Filled 2021-01-16 (×3): qty 1

## 2021-01-16 MED ORDER — CHLORHEXIDINE GLUCONATE CLOTH 2 % EX PADS
6.0000 | MEDICATED_PAD | Freq: Every day | CUTANEOUS | Status: DC
  Administered 2021-01-16 – 2021-01-17 (×2): 6 via TOPICAL

## 2021-01-16 NOTE — Progress Notes (Signed)
ANTICOAGULATION CONSULT NOTE -   Pharmacy Consult for heparin Indication: pulmonary embolus  Allergies  Allergen Reactions  . Tramadol     Felt sluggish and ineffective   . Darvocet [Propoxyphene N-Acetaminophen] Palpitations    Patient Measurements: Height: 5\' 9"  (175.3 cm) Weight: 67.4 kg (148 lb 9.4 oz) IBW/kg (Calculated) : 70.7 Heparin Dosing Weight: 69.4 kg  Vital Signs: Temp: 98.2 F (36.8 C) (04/21 1724) Temp Source: Oral (04/21 1724) BP: 112/69 (04/21 1700) Pulse Rate: 83 (04/21 1700)  Labs: Recent Labs    01/15/21 1820 01/15/21 2121 01/16/21 0006 01/16/21 0238 01/16/21 1659  HGB 16.7  --   --  15.6  --   HCT 50.0  --   --  47.0  --   PLT 154  --   --  145*  --   APTT  --   --   --  56*  --   LABPROT  --   --   --  14.0  --   INR  --   --   --  1.1  --   HEPARINUNFRC  --   --   --   --  0.47  CREATININE 1.16  --   --  0.92  --   TROPONINIHS 45* 56* 56* 54*  --     Estimated Creatinine Clearance: 81.4 mL/min (by C-G formula based on SCr of 0.92 mg/dL).   Medical History: Past Medical History:  Diagnosis Date  . Chronic lower back pain    a. Follow[facility] at [facility].  . Colon cancer [facility])    rectal cancer  . Coronary artery disease    a. 03/2013: abnl nuc -> LHC s/p DES to LCx, residual moderate disease in LAD (med rx unless refractory angina). b. Not on BB due to bradycardia.  Marland Kitchen DVT (deep venous thrombosis) ([place]) ~ 2013  . Dysrhythmia    AFib  . GERD (gastroesophageal reflux disease)   . H/O necrotising fasciitis   . History of blood transfusion    "once; after throwing up alot of blood" (04/17/2013)  . Hypertension   . LV dysfunction    a. EF 45% in 03/2013.  Marland Kitchen PAD (peripheral artery disease) ([place])    a. Occlusion of the right internal iliac artery, with significant atherosclerosis in the left internal iliac which was not amenable to reconstruction per notes from [place] in place 05/30/2020  . Pulmonary  nodules 09/30/2014  . Rectal cancer ([place])   . Tobacco abuse     Medications:  Medications Prior to Admission  Medication Sig Dispense Refill Last Dose  . alum & mag hydroxide-simeth (MAALOX/MYLANTA) 200-200-20 MG/5 SUSP Take 1 tablespoon (4ml) by mouth before meals.  If 1 doesn't help, you can take 2 tablespoons 480 mL 2   . apixaban (ELIQUIS) 5 MG TABS tablet Take 1 tablet (5 mg total) by mouth 2 (two) times daily.   01/15/2021 at 0900  . aspirin EC 81 MG tablet Take 81 mg by mouth daily.   01/15/2021 at 0900  . DURVALUMAB IV Inject into the vein every 14 (fourteen) days.     Marland Kitchen HYDROcodone-acetaminophen (NORCO) 10-325 MG tablet Take 1 tablet by mouth every 4 (four) hours as needed. 180 tablet 0 01/15/2021 at Unknown time  . isosorbide mononitrate (IMDUR) 30 MG 24 hr tablet TAKE 1/2 TABLET BY MOUTH ONCE DAILY. (Patient taking differently: Take 15 mg by mouth daily.) 15 tablet 3 01/15/2021 at Unknown time  .  lidocaine (XYLOCAINE) 2 % solution      . lisinopril (ZESTRIL) 20 MG tablet Take 20 mg by mouth daily.   01/15/2021 at Unknown time  . NITROSTAT 0.4 MG SL tablet PLACE 1 TABLET UNDER THE TONGUE AS NEEDED FOR CHEST PAIN UP TO 3 DOSES 25 tablet 3   . omeprazole (PRILOSEC) 40 MG capsule Take 40 mg by mouth daily.   01/15/2021 at Unknown time  . gabapentin (NEURONTIN) 300 MG capsule Take 1 capsule by mouth 2 (two) times daily. (Patient not taking: Reported on 01/16/2021)   Not Taking at Unknown time  . sucralfate (CARAFATE) 1 GM/10ML suspension Take 10 mLs (1 g total) by mouth every 3 (three) hours as needed. (Patient not taking: Reported on 01/16/2021) 960 mL 1 Not Taking at Unknown time  . traMADol (ULTRAM) 50 MG tablet Take 1 tablet (50 mg total) by mouth every 6 (six) hours as needed. (Patient not taking: Reported on 01/16/2021) 30 tablet 0 Not Taking at Unknown time    Assessment: Pharmacy consulted to dose heparin in patient with pulmonary embolus. CTA chest showed bilateral LL emboli.  Patient on  Apixaban prior to admission and changed to warfarin- subtherapeutic INR today. He received warfarin and therapeutic dose of lovenox 4/21 0020.  HL 0.47- therapeutic  Goal of Therapy:  Heparin level 0.3-0.7 units/ml Monitor platelets by anticoagulation protocol: Yes   Plan:  Continue heparin infusion at 1150 units/hr. Check anti-Xa level in 6 hours and daily while on heparin. Continue to monitor H&H and platelets.   Margot Ables, PharmD Clinical Pharmacist 01/16/2021 5:36 PM

## 2021-01-16 NOTE — ED Notes (Signed)
Pt to ICU 11.

## 2021-01-16 NOTE — Progress Notes (Addendum)
ANTICOAGULATION CONSULT NOTE - Initial Consult  Pharmacy Consult for heparin Indication: pulmonary embolus  Allergies  Allergen Reactions  . Tramadol     Felt sluggish and ineffective   . Darvocet [Propoxyphene N-Acetaminophen] Palpitations    Patient Measurements: Height: 5\' 9"  (175.3 cm) Weight: 69.4 kg (153 lb) IBW/kg (Calculated) : 70.7 Heparin Dosing Weight: 69.4 kg  Vital Signs: BP: 110/78 (04/21 0800) Pulse Rate: 80 (04/21 0800)  Labs: Recent Labs    01/15/21 1820 01/15/21 2121 01/16/21 0006 01/16/21 0238  HGB 16.7  --   --  15.6  HCT 50.0  --   --  47.0  PLT 154  --   --  145*  APTT  --   --   --  56*  LABPROT  --   --   --  14.0  INR  --   --   --  1.1  CREATININE 1.16  --   --  0.92  TROPONINIHS 45* 56* 56* 54*    Estimated Creatinine Clearance: 83.8 mL/min (by C-G formula based on SCr of 0.92 mg/dL).   Medical History: Past Medical History:  Diagnosis Date  . Chronic lower back pain    a. Followed by pain management at [facility].  . Colon cancer [facility])    rectal cancer  . Coronary artery disease    a. 03/2013: abnl nuc -> LHC s/p DES to LCx, residual moderate disease in LAD (med rx unless refractory angina). b. Not on BB due to bradycardia.  Marland Kitchen DVT (deep venous thrombosis) ([place]) ~ 2013  . Dysrhythmia    AFib  . GERD (gastroesophageal reflux disease)   . H/O necrotising fasciitis   . History of blood transfusion    "once; after throwing up alot of blood" (04/17/2013)  . Hypertension   . LV dysfunction    a. EF 45% in 03/2013.  Marland Kitchen PAD (peripheral artery disease) ([place])    a. Occlusion of the right internal iliac artery, with significant atherosclerosis in the left internal iliac which was not amenable to reconstruction per notes from [place] in place 05/30/2020  . Pulmonary nodules 09/30/2014  . Rectal cancer ([place])   . Tobacco abuse     Medications:  (Not in a hospital admission)   Assessment: Pharmacy consulted to dose  heparin in patient with pulmonary embolus. CTA chest showed bilateral LL emboli.  Patient on Apixaban prior to admission and changed to warfarin- subtherapeutic INR today. He received warfarin and therapeutic dose of lovenox 4/21 0020.  CBC WNL  Goal of Therapy:  Heparin level 0.3-0.7 units/ml Monitor platelets by anticoagulation protocol: Yes   Plan:  No bolus since received therapeutic lovenox Start heparin infusion at 1150 units/hr. Check anti-Xa level in 6 hours and daily while on heparin. Continue to monitor H&H and platelets.   Margot Ables, PharmD Clinical Pharmacist 01/16/2021 8:39 AM

## 2021-01-16 NOTE — Progress Notes (Signed)
PROGRESS NOTE  ASBURY HAIR IHK:[id number] DOB: 01-06-1961 DOA: 01/15/2021 PCP: [name], MD  Brief History:  60 year old male with a history of stage IIa rectal adenocarcinoma status postresection and diverting colostomy, ischemic cardiomyopathy, coronary artery disease with stent, paroxysmal atrial fibrillation, stage IIIa squamous cell carcinoma of the right lung, hyperlipidemia, and tobacco abuse presenting with exertional dyspnea for the past 2 to 3 weeks.  The patient has been receiving immunotherapy with durvalumab (last dose 01/02/21) from Dr. Delton Coombes for his [facility] lung cancer, and the patient was scheduled for a CT of the chest to reassess his response to immunotherapy.  The CT chest on 01/15/2021 suggested a possible filling defect in the LLL.  As result, the patient was sent to the emergency department for CTA of the chest.  The patient states that he has had worsening shortness of breath for the past 2 to 3 weeks having difficulty even walking across the room without shortness of breath.  He has had some exertional chest pressure, but primarily he states that the shortness of breath is limiting his activity.  He denies any fevers, chills, headache, nausea, vomiting, diarrhea, abdominal pain, dysuria, hematuria.  He denies any hemoptysis, but he does have a cough with yellow sputum which has been chronic. Notably, the patient states that he has been taking his apixaban once daily for as long as he can remember rather than twice daily. In the emergency department, the patient was afebrile hemodynamically stable albeit with soft blood pressures with SBP 90-100s.  Oxygen saturation was 95% on 2 L.  The patient was noted to have atrial fibrillation with RVR and started on a diltiazem drip.  CTA chest showed bilateral LL emboli without RV strain.  There was focal subpleural consolidation in the RLL concerning for infection versus malignancy.  There is a small right pleural effusion  with right hilar and subcarinal lymphadenopathy.  There was numerous hepatic metastases.  Assessment/Plan: Acute pulmonary embolus -Start IV heparin -plan to discuss with Dr. Delton Coombes regarding his choice for [facility] going forward -Echo -CTA chest as discussed above  Atrial fibrillation with RVR -Continue anticoagulation -Transition to oral diltiazem -TSH 0.882  Thrombocytopenia -This may be related to his immunotherapy -Has been chronic since 10/09/2020 -Serum V56 433 -Folic acid 29.5 -TSH 1.884  Metastatic squamous cell carcinoma of the right lung -01/15/2021 CT chest showed multiple new peripheral enhancing liver metastases and enlarging peripheral RLL lung lesion -Follow-up with Dr. Delton Coombes  Chronic pain syndrome/neoplastic pain -Continue home dose of hydrocodone 10/325 -PMDP reviewed--no red flags  Right pleural effusion -This is likely secondary to his PE as well as a malignant effusion -Clinically euvolemic  Stage II gluteal wound -This has been chronic since surgical intervention for his necrotizing fasciitis -He is followed by [name] at [facility]     Status is: Inpatient  Remains inpatient appropriate because:IV treatments appropriate due to intensity of illness or inability to take PO   Dispo: The patient is from: Home              Anticipated d/c is to: Home              Patient currently is not medically stable to d/c.   Difficult to place patient No        Family Communication:   No Family at bedside  Consultants:  palliative  Code Status:  FULL   DVT Prophylaxis: IV hepain   Procedures: As  Listed in Progress Note Above  Antibiotics: None      Subjective: Patient denies fevers, chills, headache, chest pain, dyspnea, nausea, vomiting, diarrhea, abdominal pain, dysuria, hematuria, hematochezia, and melena.   Objective: Vitals:   01/16/21 0500 01/16/21 0530 01/16/21 0600 01/16/21 0630  BP: 93/73 93/72 110/78 103/77  Pulse: 80  76 (!) 124 85  Resp: 20 17 19 18   Temp:      TempSrc:      SpO2: 95% 93% 95% 97%  Weight:      Height:        Intake/Output Summary (Last 24 hours) at 01/16/2021 0715 Last data filed at 01/16/2021 0419 Gross per 24 hour  Intake 55.67 ml  Output 350 ml  Net -294.33 ml   Weight change:  Exam:   General:  Pt is alert, follows commands appropriately, not in acute distress  HEENT: No icterus, No thrush, No neck mass, [place]/AT  Cardiovascular: IRRR, S1/S2, no rubs, no gallops  Respiratory: Diminished breath sounds bilateral.  Bibasilar rales.  Abdomen: Soft/+BS, non tender, non distended, no guarding  Extremities: No edema, No lymphangitis, No petechiae, No rashes, no synovitis   Data Reviewed: I have personally reviewed following labs and imaging studies Basic Metabolic Panel: Recent Labs  Lab 01/15/21 1820 01/16/21 0238  NA 136 136  K 4.0 4.1  CL 104 107  CO2 24 22  GLUCOSE 91 90  BUN 10 9  CREATININE 1.16 0.92  CALCIUM 8.8* 8.6*  MG  --  1.9  PHOS  --  3.2   Liver Function Tests: Recent Labs  Lab 01/15/21 1820 01/16/21 0238  AST 20 19  ALT 18 17  ALKPHOS 68 61  BILITOT 1.0 1.2  PROT 7.2 6.6  ALBUMIN 3.1* 2.9*   No results for input(s): LIPASE, AMYLASE in the last 168 hours. No results for input(s): AMMONIA in the last 168 hours. Coagulation Profile: Recent Labs  Lab 01/16/21 0238  INR 1.1   CBC: Recent Labs  Lab 01/15/21 1820 01/16/21 0238  WBC 5.2 5.8  NEUTROABS 3.9  --   HGB 16.7 15.6  HCT 50.0 47.0  MCV 104.0* 103.3*  PLT 154 145*   Cardiac Enzymes: No results for input(s): CKTOTAL, CKMB, CKMBINDEX, TROPONINI in the last 168 hours. BNP: Invalid input(s): POCBNP CBG: No results for input(s): GLUCAP in the last 168 hours. HbA1C: No results for input(s): HGBA1C in the last 72 hours. Urine analysis:    Component Value Date/Time   COLORURINE YELLOW 12/31/2017 1144   APPEARANCEUR CLEAR 12/31/2017 1144   LABSPEC 1.027 12/31/2017 1144    PHURINE 5.0 12/31/2017 1144   GLUCOSEU NEGATIVE 12/31/2017 1144   HGBUR NEGATIVE 12/31/2017 1144   BILIRUBINUR NEGATIVE 12/31/2017 1144   KETONESUR NEGATIVE 12/31/2017 1144   PROTEINUR NEGATIVE 12/31/2017 1144   UROBILINOGEN 1.0 06/24/2015 0837   NITRITE NEGATIVE 12/31/2017 1144   LEUKOCYTESUR NEGATIVE 12/31/2017 1144   Sepsis Labs: @LABRCNTIP (procalcitonin:4,lacticidven:4) ) Recent Results (from the past 240 hour(s))  Resp Panel by RT-PCR (Flu A&B, Covid) Nasopharyngeal Swab     Status: None   Collection Time: 01/15/21  9:47 PM   Specimen: Nasopharyngeal Swab; Nasopharyngeal(NP) swabs in vial transport medium  Result Value Ref Range Status   SARS Coronavirus 2 by RT PCR NEGATIVE NEGATIVE Final    Comment: (NOTE) SARS-CoV-2 target nucleic acids are NOT DETECTED.  The SARS-CoV-2 RNA is generally detectable in upper respiratory specimens during the acute phase of infection. The lowest concentration of SARS-CoV-2 viral copies this assay  can detect is 138 copies/mL. A negative result does not preclude SARS-Cov-2 infection and should not be used as the sole basis for treatment or other patient management decisions. A negative result may occur with  improper specimen collection/handling, submission of specimen other than nasopharyngeal swab, presence of viral mutation(s) within the areas targeted by this assay, and inadequate number of viral copies(<138 copies/mL). A negative result must be combined with clinical observations, patient history, and epidemiological information. The expected result is Negative.  Fact Sheet for Patients:  EntrepreneurPulse.com.au  Fact Sheet for Healthcare Providers:  IncredibleEmployment.be  This test is no t yet approved or cleared by the [place] FDA and  has been authorized for detection and/or diagnosis of SARS-CoV-2 by FDA under an Emergency Use Authorization (EUA). This EUA will remain  in effect  (meaning this test can be used) for the duration of the COVID-19 declaration under Section 564(b)(1) of the Act, 21 U.S.C.section 360bbb-3(b)(1), unless the authorization is terminated  or revoked sooner.       Influenza A by PCR NEGATIVE NEGATIVE Final   Influenza B by PCR NEGATIVE NEGATIVE Final    Comment: (NOTE) The Xpert Xpress SARS-CoV-2/FLU/RSV plus assay is intended as an aid in the diagnosis of influenza from Nasopharyngeal swab specimens and should not be used as a sole basis for treatment. Nasal washings and aspirates are unacceptable for Xpert Xpress SARS-CoV-2/FLU/RSV testing.  Fact Sheet for Patients: EntrepreneurPulse.com.au  Fact Sheet for Healthcare Providers: IncredibleEmployment.be  This test is not yet approved or cleared by the [place] FDA and has been authorized for detection and/or diagnosis of SARS-CoV-2 by FDA under an Emergency Use Authorization (EUA). This EUA will remain in effect (meaning this test can be used) for the duration of the COVID-19 declaration under Section 564(b)(1) of the Act, 21 U.S.C. section 360bbb-3(b)(1), unless the authorization is terminated or revoked.  Performed at [facility], [address], [place], [place] [address]      Scheduled Meds: . enoxaparin (LOVENOX) injection  70 mg Subcutaneous Q12H  . feeding supplement  237 mL Oral BID BM  . pantoprazole  40 mg Oral Daily  . Warfarin - Pharmacist Dosing Inpatient   Does not apply q1600   Continuous Infusions: . diltiazem (CARDIZEM) infusion 5 mg/hr (01/16/21 0327)    Procedures/Studies: CT Chest W Contrast  Result Date: 01/15/2021 CLINICAL DATA:  Non-small cell lung cancer diagnosed in 2021. Chemotherapy completed. Immunotherapy ongoing. EXAM: CT CHEST WITH CONTRAST TECHNIQUE: Multidetector CT imaging of the chest was performed during intravenous contrast administration. CONTRAST:  37mL OMNIPAQUE IOHEXOL 300 MG/ML  SOLN  COMPARISON:  CT chest 11/18/2020 and 09/09/2020.  PET-CT 04/29/2020. FINDINGS: Cardiovascular: Although not performed as a pulmonary CTA, there is new ill-defined decreased enhancement within the segmental and subsegmental branches of the left lower lobe pulmonary artery (image 104/2), suspicious for pulmonary embolism, possibly subacute. Left subclavian Port-A-Cath extends to the mid SVC. There is atherosclerosis of the aorta, great vessels and coronary arteries. The heart size is normal. There is no pericardial effusion. Mediastinum/Nodes: There are no enlarged mediastinal, hilar or axillary lymph nodes.Small pretracheal and subcarinal lymph nodes are unchanged. The thyroid gland, trachea and esophagus demonstrate no significant findings. Lungs/Pleura: Stable small right pleural effusion and adjacent pleural thickening. Again demonstrated is moderate to severe centrilobular and paraseptal emphysema with mild diffuse central airway thickening. Progressive subpleural consolidation posteriorly in the right lower lobe, largely obscuring the previously demonstrated mass on the lung windows. On the soft tissue windows, there  is a thick walled nodular density in this area with probable peripheral enhancement which appears slightly larger, measuring 3.5 x 2.4 cm on image 101/2. Multiple additional pulmonary nodules bilaterally are unchanged, largest measuring 6 mm in the left lower lobe on image 122/4. No other enlarging nodules identified. Upper abdomen: Multiple new peripherally enhancing hepatic lesions highly suspicious for metastatic disease. Largest lesion is in the left hepatic lobe, measuring 2.5 x 2.5 cm on image 168/2. There are multiple smaller lesions, measuring 9 mm on image 150/2, 11 mm on image 157/2 and 15 mm on image 160/2. There is stable nodularity of the adrenal glands. Musculoskeletal/Chest wall: There is no chest wall mass or suspicious osseous finding. IMPRESSION: 1. Multiple new peripherally  enhancing liver lesions highly suspicious for hepatic metastatic disease. 2. Enlarging, peripherally enhancing pulmonary lesion posteriorly in the right lower lobe, largely obscured by surrounding consolidation, but suspicious for local recurrence of lung cancer, especially in light of the abdominal findings. Follow-up PET-CT may be helpful for further staging. 3. Additional pulmonary nodules are unchanged. 4. Suspected new ill-defined filling defect within the segmental and subsegmental branches of the left lower lobe pulmonary artery, suspicious for pulmonary embolism, possibly subacute. 5. Coronary and Aortic Atherosclerosis (ICD10-I70.0). Emphysema (ICD10-J43.9). 6. Critical Value/emergent results were called by telephone at the time of interpretation on 01/15/2021 at 3:20 pm to provider [facility] , who verbally acknowledged these results. Electronically Signed   By: Richardean Sale M.D.   On: 01/15/2021 15:26   CT Angio Chest PE W and/or Wo Contrast  Result Date: 01/15/2021 CLINICAL DATA:  60 year old male with concern for pulmonary embolism. History of lung cancer. EXAM: CT ANGIOGRAPHY CHEST WITH CONTRAST TECHNIQUE: Multidetector CT imaging of the chest was performed using the standard protocol during bolus administration of intravenous contrast. Multiplanar CT image reconstructions and MIPs were obtained to evaluate the vascular anatomy. CONTRAST:  59mL OMNIPAQUE IOHEXOL 350 MG/ML SOLN COMPARISON:  Chest CT dated 01/15/2021. FINDINGS: Cardiovascular: Mildly dilated right atrium with retrograde flow of contrast from the right atrium into the IVC suggestive of a degree of right heart dysfunction. No pericardial effusion. There is coronary vascular calcification. Mild atherosclerotic calcification of the thoracic aorta. Left lower lobe segmental pulmonary artery emboli as seen on the earlier CT. Additional linear and nonocclusive emboli in the segmental and subsegmental branches of the right lower  lobe. No CT evidence of right heart dysfunction. Mediastinum/Nodes: Right hilar adenopathy as well as mildly enlarged subcarinal lymph node as seen on the earlier CT. The esophagus and the thyroid gland are grossly unremarkable. No mediastinal fluid collection. Lungs/Pleura: Background of severe emphysema. Small right pleural effusion with associated subsegmental compressive atelectasis of the right lower lobe. Focal area of subpleural consolidation in the right lower lobe (256/7) as seen on the earlier CT. Follow-up as per recommendation of earlier CT. There is a 6 mm nodule along the minor fissure (111/6). No pneumothorax. The central airways are patent. Upper Abdomen: Multiple hepatic hypodense lesions most consistent with metastatic disease. Postsurgical changes of bowel with right upper quadrant ostomy. Musculoskeletal: Degenerative changes of the spine. No acute osseous pathology. Left-sided Port-A-Cath with tip over central SVC. Review of the MIP images confirms the above findings. IMPRESSION: 1. Bilateral lower lobe segmental and subsegmental pulmonary artery emboli. No CT evidence of right heart dysfunction. 2. Small right pleural effusion with associated subsegmental compressive atelectasis of the right lower lobe. 3. Focal area of subpleural consolidation in the right lower lobe as seen on the  earlier CT. Findings may be infectious in etiology or represent recurrence of malignancy. Follow-up as per recommendation of earlier chest CT. 4. Right hilar and subcarinal adenopathy as seen on the earlier CT. 5. Multiple hepatic metastatic disease. 6. Aortic Atherosclerosis (ICD10-I70.0) and Emphysema (ICD10-J43.9). These results were called by telephone at the time of interpretation on 01/15/2021 at 7:46 pm to provider [facility] , who verbally acknowledged these results. Electronically Signed   By: Anner Crete M.D.   On: 01/15/2021 19:51    Orson Eva, DO  [facility]  If 7PM-7AM, please  contact night-coverage [email or web address] Password [email or web address] 01/16/2021, 7:15 AM   LOS: 1 day

## 2021-01-16 NOTE — ED Notes (Signed)
Attempted to call report nurse currently not on the unit per staff.

## 2021-01-16 NOTE — Consult Note (Signed)
Consultation Note Date: 01/16/2021   Patient Name: Juan Wheeler  DOB: 1961/02/18  MRN: [id number]  Age / Sex: 60 y.o., male  PCP: [name], MD Referring Physician: Orson Eva, MD  Reason for Consultation: Establishing goals of care and Psychosocial/spiritual support  HPI/Patient Profile: 60 y.o. male  with past medical history of colorectal cancer status post colectomy/diverting ostomy, CAD status post DES to LCx, DVT, A. fib, GERD, HTN, right-sided squamous cell lung cancer status post chemotherapy admitted on 01/15/2021 with acute PE, A. fib with RVR.   Clinical Assessment and Goals of Care: I have reviewed medical records including EPIC notes, labs and imaging, received report from attending, examined the patient and met at bedside with Juan Wheeler to discuss diagnosis prognosis, [place], EOL wishes, disposition and options.  Juan Wheeler is lying quietly on the stretcher in the ED.  He greets me making and keeping eye contact.  He appears somewhat frail, and older than stated age.  He is alert and oriented, able to make his needs known.  I introduced Palliative Medicine as specialized medical care for people living with serious illness. It focuses on providing relief from the symptoms and stress of a serious illness.  We discussed a brief life review of the patient.  He tells me that he is legally married to his wife Juan Wheeler.  He has 5 children, but none with his current wife.  He has been independent with ADLs/IADLs.  We discussed current illness and what it means in the larger context of on-going co-morbidities.  Natural disease trajectory and expectations at EOL were discussed.  Juan Wheeler tells me that he has been taking immunotherapy with local oncologist, Dr. Raliegh Ip.  He tells me that he understands he now has a blood clot in his lungs.  We talked about the treatment plan.  At this point Juan Wheeler is  agreeable to any and all treatments offered.  Advanced directives, concepts specific to code status, were considered and discussed.   Palliative Care services outpatient were explained and offered.  Juan Wheeler would benefit from outpatient palliative services.  Questions and concerns were addressed.  The family was encouraged to call with questions or concerns.    HCPOA    NEXT OF KIN -Juan Wheeler names his wife, Juan Wheeler as his healthcare surrogate.  He states he has 5 children, none with Juan Wheeler.  He states that his children would except her decisions.    SUMMARY OF RECOMMENDATIONS   At this point continue full scope/full code Considering CODE STATUS choices Continue oncology appointments, agreeable to treatments as offered Outpatient palliative to follow  Code Status/Advance Care Planning:  Full code -Juan Wheeler states that he would not want life support if he is "brain dead".  We talked about the realities of CPR and the concept of " treat the treatable but allowing natural death".  I share my worry that if he worsens with treatment, and his heart and breathing stops, chest compressions and life support would not change his cancer.  He agrees.  I encouraged him to have these discussions with his wife/healthcare surrogate.   Symptom Management:   Per hospitalist, no additional needs at this time.  Palliative Prophylaxis:   Frequent Pain Assessment and Oral Care  Additional Recommendations (Limitations, Scope, Preferences):  Full Scope Treatment  Psycho-social/Spiritual:   Desire for further Chaplaincy support:yes  Additional Recommendations: Caregiving  Support/Resources  Prognosis:   Unable to determine, based on outcomes, oncology treatment plan.  Discharge Planning: To be determined, based on outcomes.      Primary Diagnoses: Present on Admission: . Pulmonary embolism ([place]) . Rectal cancer metastasized to lung ([place]) . Squamous cell lung cancer, right  ([place]) . Essential hypertension . Gastroesophageal reflux disease   I have reviewed the medical record, interviewed the patient and family, and examined the patient. The following aspects are pertinent.  Past Medical History:  Diagnosis Date  . Chronic lower back pain    a. Followed by pain management at [facility].  . Colon cancer [facility])    rectal cancer  . Coronary artery disease    a. 03/2013: abnl nuc -> LHC s/p DES to LCx, residual moderate disease in LAD (med rx unless refractory angina). b. Not on BB due to bradycardia.  Marland Kitchen DVT (deep venous thrombosis) ([place]) ~ 2013  . Dysrhythmia    AFib  . GERD (gastroesophageal reflux disease)   . H/O necrotising fasciitis   . History of blood transfusion    "once; after throwing up alot of blood" (04/17/2013)  . Hypertension   . LV dysfunction    a. EF 45% in 03/2013.  Marland Kitchen PAD (peripheral artery disease) ([place])    a. Occlusion of the right internal iliac artery, with significant atherosclerosis in the left internal iliac which was not amenable to reconstruction per notes from [facility] in place 05/30/2020  . Pulmonary nodules 09/30/2014  . Rectal cancer ([place])   . Tobacco abuse    Social History   Socioeconomic History  . Marital status: Married    Spouse name: Not on file  . Number of children: Not on file  . Years of education: Not on file  . Highest education level: Not on file  Occupational History  . Not on file  Tobacco Use  . Smoking status: Light Tobacco Smoker    Packs/day: 0.25    Years: 40.00    Pack years: 10.00    Types: Cigarettes    Start date: 03/14/1974  . Smokeless tobacco: Never Used  . Tobacco comment: 5-6 per day 06/12/15  Vaping Use  . Vaping Use: Never used  Substance and Sexual Activity  . Alcohol use: Yes    Alcohol/week: 3.0 standard drinks    Types: 3 Cans of beer per week  . Drug use: Yes    Types: Marijuana    Comment: 2 days ago   . Sexual activity: Yes    Partners: Male    Birth  control/protection: None  Other Topics Concern  . Not on file  Social History Narrative  . Not on file   Social Determinants of Health   Financial Resource Strain: Low Risk   . Difficulty of Paying Living Expenses: Not hard at all  Food Insecurity: No Food Insecurity  . Worried About Charity fundraiser in the Last Year: Never true  . Ran Out of Food in the Last Year: Never true  Transportation Needs: No Transportation Needs  . Lack of Transportation (Medical): No  . Lack  of Transportation (Non-Medical): No  Physical Activity: Inactive  . Days of Exercise per Week: 0 days  . Minutes of Exercise per Session: 0 min  Stress: Stress Concern Present  . Feeling of Stress : To some extent  Social Connections: Moderately Integrated  . Frequency of Communication with Friends and Family: Three times a week  . Frequency of Social Gatherings with Friends and Family: Three times a week  . Attends Religious Services: 1 to 4 times per year  . Active Member of Clubs or Organizations: No  . Attends Archivist Meetings: Never  . Marital Status: Married   Family History  Problem Relation Age of Onset  . Cancer Mother   . Hypertension Mother   . Bleeding Disorder Brother    Scheduled Meds: . feeding supplement  237 mL Oral BID BM  . pantoprazole  40 mg Oral Daily   Continuous Infusions: . diltiazem (CARDIZEM) infusion 5 mg/hr (01/16/21 1324)  . heparin 1,150 Units/hr (01/16/21 1520)   PRN Meds:. Medications Prior to Admission:  Prior to Admission medications   Medication Sig Start Date End Date Taking? Authorizing Provider  alum & mag hydroxide-simeth (MAALOX/MYLANTA) 200-200-20 MG/5 SUSP Take 1 tablespoon (25m) by mouth before meals.  If 1 doesn't help, you can take 2 tablespoons 07/25/20  Yes KDerek Jack MD  apixaban (ELIQUIS) 5 MG TABS tablet Take 1 tablet (5 mg total) by mouth 2 (two) times daily. 06/16/16  Yes JDomenic Polite MD  aspirin EC 81 MG tablet Take 81  mg by mouth daily.   Yes [provider]  DURVALUMAB IV Inject into the vein every 14 (fourteen) days. 09/12/20  Yes [provider]  HYDROcodone-acetaminophen (NORCO) 10-325 MG tablet Take 1 tablet by mouth every 4 (four) hours as needed. 12/19/20  Yes KDerek Jack MD  isosorbide mononitrate (IMDUR) 30 MG 24 hr tablet TAKE 1/2 TABLET BY MOUTH ONCE DAILY. Patient taking differently: Take 15 mg by mouth daily. 12/04/15  Yes KHerminio Commons MD  lidocaine (XYLOCAINE) 2 % solution  08/01/20  Yes [provider]  lisinopril (ZESTRIL) 20 MG tablet Take 20 mg by mouth daily. 05/29/20  Yes [provider]  NITROSTAT 0.4 MG SL tablet PLACE 1 TABLET UNDER THE TONGUE AS NEEDED FOR CHEST PAIN UP TO 3 DOSES 10/06/16  Yes KHerminio Commons MD  omeprazole (PRILOSEC) 40 MG capsule Take 40 mg by mouth daily. 07/20/16  Yes [provider]  gabapentin (NEURONTIN) 300 MG capsule Take 1 capsule by mouth 2 (two) times daily. Patient not taking: Reported on 01/16/2021 01/13/21   [provider]  sucralfate (CARAFATE) 1 GM/10ML suspension Take 10 mLs (1 g total) by mouth every 3 (three) hours as needed. Patient not taking: Reported on 01/16/2021 08/14/20   KDerek Jack MD  traMADol (ULTRAM) 50 MG tablet Take 1 tablet (50 mg total) by mouth every 6 (six) hours as needed. Patient not taking: Reported on 01/16/2021 05/01/20   KDerek Jack MD  prochlorperazine (COMPAZINE) 10 MG tablet Take 1 tablet (10 mg total) by mouth every 6 (six) hours as needed (Nausea or vomiting). 06/26/20 08/16/20  KDerek Jack MD   Allergies  Allergen Reactions  . Tramadol     Felt sluggish and ineffective   . Darvocet [Propoxyphene N-Acetaminophen] Palpitations   Review of Systems  Unable to perform ROS: Other    Physical Exam Vitals and nursing note reviewed.     Vital Signs: BP 109/74   Pulse 83  Temp 98.6 F (37 C) (Oral)   Resp 19   Ht _0   (1.753 m)   Wt 69.4 kg   SpO2 96%   BMI 22.59 kg/m  Pain Scale: 0-10   Pain Score: 0-No pain   SpO2: SpO2: 96 % O2 Device:SpO2: 96 % O2 Flow Rate: .O2 Flow Rate (L/min): 2 L/min  IO: Intake/output summary:   Intake/Output Summary (Last 24 hours) at 01/16/2021 1528 Last data filed at 01/16/2021 0419 Gross per 24 hour  Intake 55.67 ml  Output 350 ml  Net -294.33 ml    LBM:   Baseline Weight: Weight: 69.4 kg Most recent weight: Weight: 69.4 kg     Palliative Assessment/Data:   Flowsheet Rows   Flowsheet Row Most Recent Value  Intake Tab   Referral Department Hospitalist  Unit at Time of Referral Intermediate Care Unit  Palliative Care Primary Diagnosis Cancer  Date Notified 01/16/21  Palliative Care Type New Palliative care  Reason for referral Clarify Goals of Care  Date of Admission 01/15/21  Date first seen by Palliative Care 01/16/21  # of days Palliative referral response time 0 Day(s)  # of days IP prior to Palliative referral 1  Clinical Assessment   Palliative Performance Scale Score 30%  Pain Max last 24 hours Not able to report  Pain Min Last 24 hours Not able to report  Dyspnea Max Last 24 Hours Not able to report  Dyspnea Min Last 24 hours Not able to report  Psychosocial & Spiritual Assessment   Palliative Care Outcomes       Time In: 1500 Time Out: 1550 Time Total: 50 minutes  Greater than 50%  of this time was spent counseling and coordinating care related to the above assessment and plan.  Signed by: Drue Novel, NP   Please contact Palliative Medicine Team phone at [phone] for questions and concerns.  For individual provider: See Shea Evans

## 2021-01-16 NOTE — ED Notes (Signed)
Patient is asleep and resting comfortably.  

## 2021-01-16 NOTE — Progress Notes (Signed)
[place] for heparin Indication: pulmonary embolus  Allergies  Allergen Reactions  . Tramadol     Felt sluggish and ineffective   . Darvocet [Propoxyphene N-Acetaminophen] Palpitations    Patient Measurements: Height: 5\' 9"  (175.3 cm) Weight: 67.4 kg (148 lb 9.4 oz) IBW/kg (Calculated) : 70.7 Heparin Dosing Weight: 69.4 kg  Vital Signs: Temp: 97.8 F (36.6 C) (04/21 2012) Temp Source: Oral (04/21 2012) BP: 121/81 (04/21 2000) Pulse Rate: 83 (04/21 2000)  Labs: Recent Labs    01/15/21 1820 01/15/21 2121 01/16/21 0006 01/16/21 0238 01/16/21 1659 01/16/21 2248  HGB 16.7  --   --  15.6  --   --   HCT 50.0  --   --  47.0  --   --   PLT 154  --   --  145*  --   --   APTT  --   --   --  56*  --   --   LABPROT  --   --   --  14.0  --   --   INR  --   --   --  1.1  --   --   HEPARINUNFRC  --   --   --   --  0.47 0.45  CREATININE 1.16  --   --  0.92  --   --   TROPONINIHS 45* 56* 56* 54*  --   --     Estimated Creatinine Clearance: 81.4 mL/min (by C-G formula based on SCr of 0.92 mg/dL).   Medical History: Past Medical History:  Diagnosis Date  . Chronic lower back pain    a. Followed by pain management at [facility].  . Colon cancer [facility])    rectal cancer  . Coronary artery disease    a. 03/2013: abnl nuc -> LHC s/p DES to LCx, residual moderate disease in LAD (med rx unless refractory angina). b. Not on BB due to bradycardia.  Marland Kitchen DVT (deep venous thrombosis) ([place]) ~ 2013  . Dysrhythmia    AFib  . GERD (gastroesophageal reflux disease)   . H/O necrotising fasciitis   . History of blood transfusion    "once; after throwing up alot of blood" (04/17/2013)  . Hypertension   . LV dysfunction    a. EF 45% in 03/2013.  Marland Kitchen PAD (peripheral artery disease) ([place])    a. Occlusion of the right internal iliac artery, with significant atherosclerosis in the left internal iliac which was not amenable to reconstruction per notes from [facility] in place 05/30/2020  . Pulmonary nodules 09/30/2014  . Rectal cancer ([place])   . Tobacco abuse     Medications:  Medications Prior to Admission  Medication Sig Dispense Refill Last Dose  . alum & mag hydroxide-simeth (MAALOX/MYLANTA) 200-200-20 MG/5 SUSP Take 1 tablespoon (33ml) by mouth before meals.  If 1 doesn't help, you can take 2 tablespoons 480 mL 2   . apixaban (ELIQUIS) 5 MG TABS tablet Take 1 tablet (5 mg total) by mouth 2 (two) times daily.   01/15/2021 at 0900  . aspirin EC 81 MG tablet Take 81 mg by mouth daily.   01/15/2021 at 0900  . DURVALUMAB IV Inject into the vein every 14 (fourteen) days.     Marland Kitchen HYDROcodone-acetaminophen (NORCO) 10-325 MG tablet Take 1 tablet by mouth every 4 (four) hours as needed. 180 tablet 0 01/15/2021 at Unknown time  . isosorbide mononitrate (IMDUR) 30 MG 24 hr  tablet TAKE 1/2 TABLET BY MOUTH ONCE DAILY. (Patient taking differently: Take 15 mg by mouth daily.) 15 tablet 3 01/15/2021 at Unknown time  . lidocaine (XYLOCAINE) 2 % solution      . lisinopril (ZESTRIL) 20 MG tablet Take 20 mg by mouth daily.   01/15/2021 at Unknown time  . NITROSTAT 0.4 MG SL tablet PLACE 1 TABLET UNDER THE TONGUE AS NEEDED FOR CHEST PAIN UP TO 3 DOSES 25 tablet 3   . omeprazole (PRILOSEC) 40 MG capsule Take 40 mg by mouth daily.   01/15/2021 at Unknown time  . gabapentin (NEURONTIN) 300 MG capsule Take 1 capsule by mouth 2 (two) times daily. (Patient not taking: Reported on 01/16/2021)   Not Taking at Unknown time  . sucralfate (CARAFATE) 1 GM/10ML suspension Take 10 mLs (1 g total) by mouth every 3 (three) hours as needed. (Patient not taking: Reported on 01/16/2021) 960 mL 1 Not Taking at Unknown time  . traMADol (ULTRAM) 50 MG tablet Take 1 tablet (50 mg total) by mouth every 6 (six) hours as needed. (Patient not taking: Reported on 01/16/2021) 30 tablet 0 Not Taking at Unknown time    Assessment: Pharmacy consulted to dose heparin in patient with pulmonary  embolus. CTA chest showed bilateral LL emboli.  Patient on Apixaban prior to admission and changed to warfarin- subtherapeutic INR today. He received warfarin and therapeutic dose of lovenox 4/21 0020.  4/21 PM update:  Heparin level therapeutic x 2  Goal of Therapy:  Heparin level 0.3-0.7 units/ml Monitor platelets by anticoagulation protocol: Yes   Plan:  Continue heparin infusion at 1150 units/hr Daily CBC/heparin level Monitor for bleeding  Narda Bonds, PharmD, [place] Pharmacist Phone: [phone]

## 2021-01-17 ENCOUNTER — Inpatient Hospital Stay (HOSPITAL_COMMUNITY): Payer: Medicare PPO

## 2021-01-17 DIAGNOSIS — I2609 Other pulmonary embolism with acute cor pulmonale: Secondary | ICD-10-CM

## 2021-01-17 LAB — ECHOCARDIOGRAM COMPLETE
Area-P 1/2: 2.77 cm2
Height: 69 in
S' Lateral: 4.01 cm
Weight: 2380.97 oz

## 2021-01-17 LAB — CBC
HCT: 47.3 % (ref 39.0–52.0)
Hemoglobin: 15.8 g/dL (ref 13.0–17.0)
MCH: 34.4 pg — ABNORMAL HIGH (ref 26.0–34.0)
MCHC: 33.4 g/dL (ref 30.0–36.0)
MCV: 103.1 fL — ABNORMAL HIGH (ref 80.0–100.0)
Platelets: 149 10*3/uL — ABNORMAL LOW (ref 150–400)
RBC: 4.59 MIL/uL (ref 4.22–5.81)
RDW: 15.3 % (ref 11.5–15.5)
WBC: 4.6 10*3/uL (ref 4.0–10.5)
nRBC: 0 % (ref 0.0–0.2)

## 2021-01-17 LAB — BASIC METABOLIC PANEL
Anion gap: 8 (ref 5–15)
BUN: 12 mg/dL (ref 6–20)
CO2: 23 mmol/L (ref 22–32)
Calcium: 8.6 mg/dL — ABNORMAL LOW (ref 8.9–10.3)
Chloride: 104 mmol/L (ref 98–111)
Creatinine, Ser: 1.15 mg/dL (ref 0.61–1.24)
GFR, Estimated: >60 mL/min (ref >60)
Glucose, Bld: 90 mg/dL (ref 70–99)
Potassium: 4.2 mmol/L (ref 3.5–5.1)
Sodium: 135 mmol/L (ref 135–145)

## 2021-01-17 LAB — HEPARIN LEVEL (UNFRACTIONATED): Heparin Unfractionated: 0.38 IU/mL (ref 0.30–0.70)

## 2021-01-17 LAB — MRSA PCR SCREENING: MRSA by PCR: NEGATIVE

## 2021-01-17 MED ORDER — METOPROLOL TARTRATE 25 MG PO TABS
12.5000 mg | ORAL_TABLET | Freq: Two times a day (BID) | ORAL | Status: DC
  Administered 2021-01-17 – 2021-01-18 (×3): 12.5 mg via ORAL
  Filled 2021-01-17 (×4): qty 1

## 2021-01-17 NOTE — Progress Notes (Signed)
PROGRESS NOTE  Juan Wheeler VQQ:[id number] DOB: 05/20/1961 DOA: 01/15/2021 PCP: [name], MD  Brief History:  60 year old male with a history of stage IIa rectal adenocarcinoma status postresection and diverting colostomy, ischemic cardiomyopathy, coronary artery disease with stent, paroxysmal atrial fibrillation, stage IIIa squamous cell carcinoma of the right lung, hyperlipidemia, and tobacco abuse presenting with exertional dyspnea for the past 2 to 3 weeks.  The patient has been receiving immunotherapy with durvalumab (last dose 01/02/21) from Dr. Delton Coombes for his [facility] lung cancer, and the patient was scheduled for a CT of the chest to reassess his response to immunotherapy.  The CT chest on 01/15/2021 suggested a possible filling defect in the LLL.  As result, the patient was sent to the emergency department for CTA of the chest.  The patient states that he has had worsening shortness of breath for the past 2 to 3 weeks having difficulty even walking across the room without shortness of breath.  He has had some exertional chest pressure, but primarily he states that the shortness of breath is limiting his activity.  He denies any fevers, chills, headache, nausea, vomiting, diarrhea, abdominal pain, dysuria, hematuria.  He denies any hemoptysis, but he does have a cough with yellow sputum which has been chronic. Notably, the patient states that he has been taking his apixaban once daily for as long as he can remember rather than twice daily. In the emergency department, the patient was afebrile hemodynamically stable albeit with soft blood pressures with SBP 90-100s.  Oxygen saturation was 95% on 2 L.  The patient was noted to have atrial fibrillation with RVR and started on a diltiazem drip.  CTA chest showed bilateral LL emboli without RV strain.  There was focal subpleural consolidation in the RLL concerning for infection versus malignancy.  There is a small right pleural effusion  with right hilar and subcarinal lymphadenopathy.  There was numerous hepatic metastases.  Assessment/Plan: Acute pulmonary embolus -Start IV heparin -plan to discuss with Dr. Delton Coombes regarding his choice for [facility] going forward -01/17/21 Echo--EF 30-35%, global HK, mild decreased RV function, mild MR -11/16/18 Echo--EF 50-55% -do not feel that clinically that PE has directly caused the new Echo findings; ??effect from chemo vs ischemic disease -start low dose ARB if BP able to tolerate -CTA chest as discussed above  Atrial fibrillation with RVR -Continue anticoagulation -Transition off IV diltiazem to po metoprolol due to decrease in EF -now rate controlled -TSH 0.882  Thrombocytopenia -This may be related to his immunotherapy vs new PE (consumption) -Has been chronic since 10/09/2020 -Serum E33 295 -Folic acid 18.8 -TSH 4.166  Metastatic squamous cell carcinoma of the right lung -01/15/2021 CT chest showed multiple new peripheral enhancing liver metastases and enlarging peripheral RLL lung lesion -Follow-up with Dr. Delton Coombes  Chronic pain syndrome/neoplastic pain -Continue home dose of hydrocodone 10/325 -PMDP reviewed--no red flags  Right pleural effusion -This is likely secondary to his PE as well as a malignant effusion -Clinically euvolemic  Stage II gluteal wound -This has been chronic since surgical intervention for his necrotizing fasciitis -He is followed by [name] [facility]  Goals of Care -palliative consulted -after [place] discussion-->continue full scope of care    Status is: Inpatient  Remains inpatient appropriate because:IV treatments appropriate due to intensity of illness or inability to take PO   Dispo: The patient is from: Home  Anticipated d/c is to: Home  Patient currently is not  medically stable to d/c.              Difficult to place patient No        Family Communication:   spouse updated  at bedside 01/17/21  Consultants:  palliative  Code Status:  FULL   DVT Prophylaxis: IV hepain   Procedures: As Listed in Progress Note Above  Antibiotics: None    Subjective: Patient denies fevers, chills, headache, chest pain, dyspnea, nausea, vomiting, diarrhea, abdominal pain, dysuria, hematuria, hematochezia, and melena.   Objective: Vitals:   01/17/21 0400 01/17/21 0446 01/17/21 0500 01/17/21 0853  BP: 111/79  106/65   Pulse: 73  74   Resp: 16  15   Temp:  97.7 F (36.5 C)    TempSrc:  Oral    SpO2: 95%  95% 95%  Weight:  67.5 kg    Height:        Intake/Output Summary (Last 24 hours) at 01/17/2021 1446 Last data filed at 01/17/2021 0500 Gross per 24 hour  Intake 2846.57 ml  Output --  Net 2846.57 ml   Weight change: -2 kg Exam:   General:  Pt is alert, follows commands appropriately, not in acute distress  HEENT: No icterus, No thrush, No neck mass, [place]/AT  Cardiovascular: RRR, S1/S2, no rubs, no gallops  Respiratory: diminished BS.  Bibasilar rales  Abdomen: Soft/+BS, non tender, non distended, no guarding  Extremities: No edema, No lymphangitis, No petechiae, No rashes, no synovitis   Data Reviewed: I have personally reviewed following labs and imaging studies Basic Metabolic Panel: Recent Labs  Lab 01/15/21 1820 01/16/21 0238 01/17/21 0454  NA 136 136 135  K 4.0 4.1 4.2  CL 104 107 104  CO2 24 22 23   GLUCOSE 91 90 90  BUN 10 9 12   CREATININE 1.16 0.92 1.15  CALCIUM 8.8* 8.6* 8.6*  MG  --  1.9  --   PHOS  --  3.2  --    Liver Function Tests: Recent Labs  Lab 01/15/21 1820 01/16/21 0238  AST 20 19  ALT 18 17  ALKPHOS 68 61  BILITOT 1.0 1.2  PROT 7.2 6.6  ALBUMIN 3.1* 2.9*   No results for input(s): LIPASE, AMYLASE in the last 168 hours. No results for input(s): AMMONIA in the last 168 hours. Coagulation Profile: Recent Labs  Lab 01/16/21 0238  INR 1.1   CBC: Recent Labs  Lab 01/15/21 1820 01/16/21 0238  01/17/21 0454  WBC 5.2 5.8 4.6  NEUTROABS 3.9  --   --   HGB 16.7 15.6 15.8  HCT 50.0 47.0 47.3  MCV 104.0* 103.3* 103.1*  PLT 154 145* 149*   Cardiac Enzymes: No results for input(s): CKTOTAL, CKMB, CKMBINDEX, TROPONINI in the last 168 hours. BNP: Invalid input(s): POCBNP CBG: No results for input(s): GLUCAP in the last 168 hours. HbA1C: No results for input(s): HGBA1C in the last 72 hours. Urine analysis:    Component Value Date/Time   COLORURINE YELLOW 12/31/2017 1144   APPEARANCEUR CLEAR 12/31/2017 1144   LABSPEC 1.027 12/31/2017 1144   PHURINE 5.0 12/31/2017 1144   GLUCOSEU NEGATIVE 12/31/2017 1144   HGBUR NEGATIVE 12/31/2017 1144   BILIRUBINUR NEGATIVE 12/31/2017 1144   KETONESUR NEGATIVE 12/31/2017 1144   PROTEINUR NEGATIVE 12/31/2017 1144   UROBILINOGEN 1.0 06/24/2015 0837   NITRITE NEGATIVE 12/31/2017 1144   LEUKOCYTESUR NEGATIVE 12/31/2017 1144   Sepsis Labs: @LABRCNTIP (procalcitonin:4,lacticidven:4) ) Recent Results (from the past 240 hour(s))  Resp Panel by RT-PCR (Flu A&B, Covid)  Nasopharyngeal Swab     Status: None   Collection Time: 01/15/21  9:47 PM   Specimen: Nasopharyngeal Swab; Nasopharyngeal(NP) swabs in vial transport medium  Result Value Ref Range Status   SARS Coronavirus 2 by RT PCR NEGATIVE NEGATIVE Final    Comment: (NOTE) SARS-CoV-2 target nucleic acids are NOT DETECTED.  The SARS-CoV-2 RNA is generally detectable in upper respiratory specimens during the acute phase of infection. The lowest concentration of SARS-CoV-2 viral copies this assay can detect is 138 copies/mL. A negative result does not preclude SARS-Cov-2 infection and should not be used as the sole basis for treatment or other patient management decisions. A negative result may occur with  improper specimen collection/handling, submission of specimen other than nasopharyngeal swab, presence of viral mutation(s) within the areas targeted by this assay, and inadequate number  of viral copies(<138 copies/mL). A negative result must be combined with clinical observations, patient history, and epidemiological information. The expected result is Negative.  Fact Sheet for Patients:  EntrepreneurPulse.com.au  Fact Sheet for Healthcare Providers:  IncredibleEmployment.be  This test is no t yet approved or cleared by the [place] FDA and  has been authorized for detection and/or diagnosis of SARS-CoV-2 by FDA under an Emergency Use Authorization (EUA). This EUA will remain  in effect (meaning this test can be used) for the duration of the COVID-19 declaration under Section 564(b)(1) of the Act, 21 U.S.C.section 360bbb-3(b)(1), unless the authorization is terminated  or revoked sooner.       Influenza A by PCR NEGATIVE NEGATIVE Final   Influenza B by PCR NEGATIVE NEGATIVE Final    Comment: (NOTE) The Xpert Xpress SARS-CoV-2/FLU/RSV plus assay is intended as an aid in the diagnosis of influenza from Nasopharyngeal swab specimens and should not be used as a sole basis for treatment. Nasal washings and aspirates are unacceptable for Xpert Xpress SARS-CoV-2/FLU/RSV testing.  Fact Sheet for Patients: EntrepreneurPulse.com.au  Fact Sheet for Healthcare Providers: IncredibleEmployment.be  This test is not yet approved or cleared by the [place] FDA and has been authorized for detection and/or diagnosis of SARS-CoV-2 by FDA under an Emergency Use Authorization (EUA). This EUA will remain in effect (meaning this test can be used) for the duration of the COVID-19 declaration under Section 564(b)(1) of the Act, 21 U.S.C. section 360bbb-3(b)(1), unless the authorization is terminated or revoked.  Performed at [facility], [address], [place], [place] [address]   MRSA PCR Screening     Status: None   Collection Time: 01/16/21  5:47 PM   Specimen: Nasal Mucosa; Nasopharyngeal   Result Value Ref Range Status   MRSA by PCR NEGATIVE NEGATIVE Final    Comment:        The GeneXpert MRSA Assay (FDA approved for NASAL specimens only), is one component of a comprehensive MRSA colonization surveillance program. It is not intended to diagnose MRSA infection nor to guide or monitor treatment for MRSA infections. Performed at [facility], [address], [place],  [address]      Scheduled Meds: . Chlorhexidine Gluconate Cloth  6 each Topical Daily  . feeding supplement  237 mL Oral BID BM  . pantoprazole  40 mg Oral Daily   Continuous Infusions: . diltiazem (CARDIZEM) infusion 5 mg/hr (01/17/21 1425)  . heparin 1,150 Units/hr (01/17/21 0849)    Procedures/Studies: CT Chest W Contrast  Result Date: 01/15/2021 CLINICAL DATA:  Non-small cell lung cancer diagnosed in 2021. Chemotherapy completed. Immunotherapy ongoing. EXAM: CT CHEST WITH CONTRAST TECHNIQUE: Multidetector  CT imaging of the chest was performed during intravenous contrast administration. CONTRAST:  68mL OMNIPAQUE IOHEXOL 300 MG/ML  SOLN COMPARISON:  CT chest 11/18/2020 and 09/09/2020.  PET-CT 04/29/2020. FINDINGS: Cardiovascular: Although not performed as a pulmonary CTA, there is new ill-defined decreased enhancement within the segmental and subsegmental branches of the left lower lobe pulmonary artery (image 104/2), suspicious for pulmonary embolism, possibly subacute. Left subclavian Port-A-Cath extends to the mid SVC. There is atherosclerosis of the aorta, great vessels and coronary arteries. The heart size is normal. There is no pericardial effusion. Mediastinum/Nodes: There are no enlarged mediastinal, hilar or axillary lymph nodes.Small pretracheal and subcarinal lymph nodes are unchanged. The thyroid gland, trachea and esophagus demonstrate no significant findings. Lungs/Pleura: Stable small right pleural effusion and adjacent pleural thickening. Again demonstrated is moderate to severe  centrilobular and paraseptal emphysema with mild diffuse central airway thickening. Progressive subpleural consolidation posteriorly in the right lower lobe, largely obscuring the previously demonstrated mass on the lung windows. On the soft tissue windows, there is a thick walled nodular density in this area with probable peripheral enhancement which appears slightly larger, measuring 3.5 x 2.4 cm on image 101/2. Multiple additional pulmonary nodules bilaterally are unchanged, largest measuring 6 mm in the left lower lobe on image 122/4. No other enlarging nodules identified. Upper abdomen: Multiple new peripherally enhancing hepatic lesions highly suspicious for metastatic disease. Largest lesion is in the left hepatic lobe, measuring 2.5 x 2.5 cm on image 168/2. There are multiple smaller lesions, measuring 9 mm on image 150/2, 11 mm on image 157/2 and 15 mm on image 160/2. There is stable nodularity of the adrenal glands. Musculoskeletal/Chest wall: There is no chest wall mass or suspicious osseous finding. IMPRESSION: 1. Multiple new peripherally enhancing liver lesions highly suspicious for hepatic metastatic disease. 2. Enlarging, peripherally enhancing pulmonary lesion posteriorly in the right lower lobe, largely obscured by surrounding consolidation, but suspicious for local recurrence of lung cancer, especially in light of the abdominal findings. Follow-up PET-CT may be helpful for further staging. 3. Additional pulmonary nodules are unchanged. 4. Suspected new ill-defined filling defect within the segmental and subsegmental branches of the left lower lobe pulmonary artery, suspicious for pulmonary embolism, possibly subacute. 5. Coronary and Aortic Atherosclerosis (ICD10-I70.0). Emphysema (ICD10-J43.9). 6. Critical Value/emergent results were called by telephone at the time of interpretation on 01/15/2021 at 3:20 pm to provider [facility] , who verbally acknowledged these results. Electronically  Signed   By: Richardean Sale M.D.   On: 01/15/2021 15:26   CT Angio Chest PE W and/or Wo Contrast  Result Date: 01/15/2021 CLINICAL DATA:  60 year old male with concern for pulmonary embolism. History of lung cancer. EXAM: CT ANGIOGRAPHY CHEST WITH CONTRAST TECHNIQUE: Multidetector CT imaging of the chest was performed using the standard protocol during bolus administration of intravenous contrast. Multiplanar CT image reconstructions and MIPs were obtained to evaluate the vascular anatomy. CONTRAST:  52mL OMNIPAQUE IOHEXOL 350 MG/ML SOLN COMPARISON:  Chest CT dated 01/15/2021. FINDINGS: Cardiovascular: Mildly dilated right atrium with retrograde flow of contrast from the right atrium into the IVC suggestive of a degree of right heart dysfunction. No pericardial effusion. There is coronary vascular calcification. Mild atherosclerotic calcification of the thoracic aorta. Left lower lobe segmental pulmonary artery emboli as seen on the earlier CT. Additional linear and nonocclusive emboli in the segmental and subsegmental branches of the right lower lobe. No CT evidence of right heart dysfunction. Mediastinum/Nodes: Right hilar adenopathy as well as mildly enlarged subcarinal lymph node  as seen on the earlier CT. The esophagus and the thyroid gland are grossly unremarkable. No mediastinal fluid collection. Lungs/Pleura: Background of severe emphysema. Small right pleural effusion with associated subsegmental compressive atelectasis of the right lower lobe. Focal area of subpleural consolidation in the right lower lobe (256/7) as seen on the earlier CT. Follow-up as per recommendation of earlier CT. There is a 6 mm nodule along the minor fissure (111/6). No pneumothorax. The central airways are patent. Upper Abdomen: Multiple hepatic hypodense lesions most consistent with metastatic disease. Postsurgical changes of bowel with right upper quadrant ostomy. Musculoskeletal: Degenerative changes of the spine. No acute  osseous pathology. Left-sided Port-A-Cath with tip over central SVC. Review of the MIP images confirms the above findings. IMPRESSION: 1. Bilateral lower lobe segmental and subsegmental pulmonary artery emboli. No CT evidence of right heart dysfunction. 2. Small right pleural effusion with associated subsegmental compressive atelectasis of the right lower lobe. 3. Focal area of subpleural consolidation in the right lower lobe as seen on the earlier CT. Findings may be infectious in etiology or represent recurrence of malignancy. Follow-up as per recommendation of earlier chest CT. 4. Right hilar and subcarinal adenopathy as seen on the earlier CT. 5. Multiple hepatic metastatic disease. 6. Aortic Atherosclerosis (ICD10-I70.0) and Emphysema (ICD10-J43.9). These results were called by telephone at the time of interpretation on 01/15/2021 at 7:46 pm to provider [facility] , who verbally acknowledged these results. Electronically Signed   By: Anner Crete M.D.   On: 01/15/2021 19:51   ECHOCARDIOGRAM COMPLETE  Result Date: 01/17/2021    ECHOCARDIOGRAM REPORT   Patient Name:   CANDLER GINSBERG Date of Exam: 01/17/2021 Medical Rec #:  [id number]     Height:       69.0 in Accession #:    [id number]    Weight:       148.8 lb Date of Birth:  02/17/1961     BSA:          1.822 m Patient Age:    60 years      BP:           106/65 mmHg Patient Gender: M             HR:           74 bpm. Exam Location:  [facility] Procedure: 2D Echo Indications:    Pulmonary Embolus I26.09  History:        Patient has prior history of Echocardiogram examinations, most                 recent 11/16/2017. CAD, Signs/Symptoms:Chest Pain; Risk                 Factors:Hypertension and Current Smoker. Pulmonary Embolism,                 Elevated Troponin.  Sonographer:    Leavy Cella RDCS (AE) Referring Phys: [id number] OLADAPO ADEFESO IMPRESSIONS  1. Left ventricular ejection fraction, by estimation, is 30 to 35%. The left ventricle has moderately  decreased function. The left ventricle demonstrates global hypokinesis with some regional variation. Left ventricular diastolic parameters are indeterminate in the setting of atrial fibrillation.  2. Right ventricular systolic function is moderately reduced. The right ventricular size is normal. Tricuspid regurgitation signal is inadequate for assessing PA pressure.  3. Left atrial size was mildly dilated.  4. The mitral valve is grossly normal. Mild mitral valve regurgitation.  5. The aortic valve was not well visualized. There  is mild calcification of the aortic valve. Aortic valve regurgitation is not visualized.  6. The inferior vena cava is normal in size with greater than 50% respiratory variability, suggesting right atrial pressure of 3 mmHg. FINDINGS  Left Ventricle: Left ventricular ejection fraction, by estimation, is 30 to 35%. The left ventricle has moderately decreased function. The left ventricle demonstrates global hypokinesis. The left ventricular internal cavity size was normal in size. There is no left ventricular hypertrophy. Left ventricular diastolic parameters are indeterminate. Right Ventricle: The right ventricular size is normal. No increase in right ventricular wall thickness. Right ventricular systolic function is moderately reduced. Tricuspid regurgitation signal is inadequate for assessing PA pressure. Left Atrium: Left atrial size was mildly dilated. Right Atrium: Right atrial size was normal in size. Pericardium: There is no evidence of pericardial effusion. Mitral Valve: The mitral valve is grossly normal. Mild mitral valve regurgitation. Tricuspid Valve: The tricuspid valve is grossly normal. Tricuspid valve regurgitation is trivial. Aortic Valve: The aortic valve was not well visualized. There is mild calcification of the aortic valve. There is mild to moderate aortic valve annular calcification. Aortic valve regurgitation is not visualized. Pulmonic Valve: The pulmonic valve was  not well visualized. Pulmonic valve regurgitation is not visualized. Aorta: The aortic root is normal in size and structure. Venous: The inferior vena cava is normal in size with greater than 50% respiratory variability, suggesting right atrial pressure of 3 mmHg. IAS/Shunts: No atrial level shunt detected by color flow Doppler.  LEFT VENTRICLE PLAX 2D LVIDd:         4.77 cm  Diastology LVIDs:         4.01 cm  LV e' medial:    9.25 cm/s LV PW:         1.06 cm  LV E/e' medial:  7.4 LV IVS:        1.08 cm  LV e' lateral:   16.20 cm/s LVOT diam:     1.90 cm  LV E/e' lateral: 4.3 LVOT Area:     2.84 cm  RIGHT VENTRICLE RV S prime:     9.14 cm/s TAPSE (M-mode): 1.6 cm LEFT ATRIUM           Index       RIGHT ATRIUM           Index LA diam:      3.60 cm 1.98 cm/m  RA Area:     19.80 cm LA Vol (A2C): 68.6 ml 37.65 ml/m RA Volume:   57.50 ml  31.56 ml/m LA Vol (A4C): 64.9 ml 35.62 ml/m   AORTA Ao Root diam: 2.70 cm MITRAL VALVE MV Area (PHT): 2.77 cm    SHUNTS MV Decel Time: 274 msec    Systemic Diam: 1.90 cm MV E velocity: 68.90 cm/s Rozann Lesches MD Electronically signed by Rozann Lesches MD Signature Date/Time: 01/17/2021/12:17:50 PM    Final     Orson Eva, DO  [facility]  If 7PM-7AM, please contact night-coverage [email or web address] Password [facility] 01/17/2021, 2:46 PM   LOS: 2 days

## 2021-01-17 NOTE — Progress Notes (Signed)
ANTICOAGULATION CONSULT NOTE -   Pharmacy Consult for heparin Indication: pulmonary embolus  Allergies  Allergen Reactions  . Tramadol     Felt sluggish and ineffective   . Darvocet [Propoxyphene N-Acetaminophen] Palpitations    Patient Measurements: Height: 5\' 9"  (175.3 cm) Weight: 67.5 kg (148 lb 13 oz) IBW/kg (Calculated) : 70.7 Heparin Dosing Weight: 69.4 kg  Vital Signs: Temp: 97.7 F (36.5 C) (04/22 0446) Temp Source: Oral (04/22 0446) BP: 106/65 (04/22 0500) Pulse Rate: 74 (04/22 0500)  Labs: Recent Labs    01/15/21 1820 01/15/21 2121 01/16/21 0006 01/16/21 0238 01/16/21 1659 01/16/21 2248 01/17/21 0454  HGB 16.7  --   --  15.6  --   --  15.8  HCT 50.0  --   --  47.0  --   --  47.3  PLT 154  --   --  145*  --   --  149*  APTT  --   --   --  56*  --   --   --   LABPROT  --   --   --  14.0  --   --   --   INR  --   --   --  1.1  --   --   --   HEPARINUNFRC  --   --   --   --  0.47 0.45 0.38  CREATININE 1.16  --   --  0.92  --   --  1.15  TROPONINIHS 45* 56* 56* 54*  --   --   --     Estimated Creatinine Clearance: 65.2 mL/min (by C-G formula based on SCr of 1.15 mg/dL).   Medical History: Past Medical History:  Diagnosis Date  . Chronic lower back pain    a. Followed by pain management at [facility].  . Colon cancer [facility])    rectal cancer  . Coronary artery disease    a. 03/2013: abnl nuc -> LHC s/p DES to LCx, residual moderate disease in LAD (med rx unless refractory angina). b. Not on BB due to bradycardia.  Marland Kitchen DVT (deep venous thrombosis) ([place]) ~ 2013  . Dysrhythmia    AFib  . GERD (gastroesophageal reflux disease)   . H/O necrotising fasciitis   . History of blood transfusion    "once; after throwing up alot of blood" (04/17/2013)  . Hypertension   . LV dysfunction    a. EF 45% in 03/2013.  Marland Kitchen PAD (peripheral artery disease) ([place])    a. Occlusion of the right internal iliac artery, with significant atherosclerosis in the left internal iliac  which was not amenable to reconstruction per notes from [place] in place 05/30/2020  . Pulmonary nodules 09/30/2014  . Rectal cancer ([place])   . Tobacco abuse     Medications:  Medications Prior to Admission  Medication Sig Dispense Refill Last Dose  . alum & mag hydroxide-simeth (MAALOX/MYLANTA) 200-200-20 MG/5 SUSP Take 1 tablespoon (57ml) by mouth before meals.  If 1 doesn't help, you can take 2 tablespoons 480 mL 2   . apixaban (ELIQUIS) 5 MG TABS tablet Take 1 tablet (5 mg total) by mouth 2 (two) times daily.   01/15/2021 at 0900  . aspirin EC 81 MG tablet Take 81 mg by mouth daily.   01/15/2021 at 0900  . DURVALUMAB IV Inject into the vein every 14 (fourteen) days.     Marland Kitchen HYDROcodone-acetaminophen (NORCO) 10-325 MG tablet Take 1 tablet by mouth every  4 (four) hours as needed. 180 tablet 0 01/15/2021 at Unknown time  . isosorbide mononitrate (IMDUR) 30 MG 24 hr tablet TAKE 1/2 TABLET BY MOUTH ONCE DAILY. (Patient taking differently: Take 15 mg by mouth daily.) 15 tablet 3 01/15/2021 at Unknown time  . lidocaine (XYLOCAINE) 2 % solution      . lisinopril (ZESTRIL) 20 MG tablet Take 20 mg by mouth daily.   01/15/2021 at Unknown time  . NITROSTAT 0.4 MG SL tablet PLACE 1 TABLET UNDER THE TONGUE AS NEEDED FOR CHEST PAIN UP TO 3 DOSES 25 tablet 3   . omeprazole (PRILOSEC) 40 MG capsule Take 40 mg by mouth daily.   01/15/2021 at Unknown time  . gabapentin (NEURONTIN) 300 MG capsule Take 1 capsule by mouth 2 (two) times daily. (Patient not taking: Reported on 01/16/2021)   Not Taking at Unknown time  . sucralfate (CARAFATE) 1 GM/10ML suspension Take 10 mLs (1 g total) by mouth every 3 (three) hours as needed. (Patient not taking: Reported on 01/16/2021) 960 mL 1 Not Taking at Unknown time  . traMADol (ULTRAM) 50 MG tablet Take 1 tablet (50 mg total) by mouth every 6 (six) hours as needed. (Patient not taking: Reported on 01/16/2021) 30 tablet 0 Not Taking at Unknown time     Assessment: Pharmacy consulted to dose heparin in patient with pulmonary embolus. CTA chest showed bilateral LL emboli.  Patient on Apixaban prior to admission and changed to warfarin- subtherapeutic INR today. He received warfarin and therapeutic dose of lovenox 4/21 0020.  HL 0.38- therapeutic  Goal of Therapy:  Heparin level 0.3-0.7 units/ml Monitor platelets by anticoagulation protocol: Yes   Plan:  Continue heparin infusion at 1150 units/hr. Check anti-Xa level  daily while on heparin. Continue to monitor H&H and platelets.   Margot Ables, PharmD Clinical Pharmacist 01/17/2021 8:14 AM

## 2021-01-17 NOTE — Progress Notes (Signed)
*  PRELIMINARY RESULTS* Echocardiogram 2D Echocardiogram has been performed.  Leavy Cella 01/17/2021, 9:39 AM

## 2021-01-17 NOTE — Plan of Care (Signed)
Discussed with patient plan of care for the evening, pain management and new blood presure medications with some teach back displayed.  Problem: Education: Goal: Knowledge of General Education information will improve Description: Including pain rating scale, medication(s)/side effects and non-pharmacologic comfort measures Outcome: Progressing

## 2021-01-18 LAB — COMPREHENSIVE METABOLIC PANEL
ALT: 16 U/L (ref 0–44)
AST: 18 U/L (ref 15–41)
Albumin: 3.1 g/dL — ABNORMAL LOW (ref 3.5–5.0)
Alkaline Phosphatase: 64 U/L (ref 38–126)
Anion gap: 8 (ref 5–15)
BUN: 12 mg/dL (ref 6–20)
CO2: 25 mmol/L (ref 22–32)
Calcium: 8.7 mg/dL — ABNORMAL LOW (ref 8.9–10.3)
Chloride: 102 mmol/L (ref 98–111)
Creatinine, Ser: 1.16 mg/dL (ref 0.61–1.24)
GFR, Estimated: >60 mL/min (ref >60)
Glucose, Bld: 96 mg/dL (ref 70–99)
Potassium: 4.6 mmol/L (ref 3.5–5.1)
Sodium: 135 mmol/L (ref 135–145)
Total Bilirubin: 0.6 mg/dL (ref 0.3–1.2)
Total Protein: 6.8 g/dL (ref 6.5–8.1)

## 2021-01-18 LAB — CBC
HCT: 46.7 % (ref 39.0–52.0)
Hemoglobin: 15.6 g/dL (ref 13.0–17.0)
MCH: 34.7 pg — ABNORMAL HIGH (ref 26.0–34.0)
MCHC: 33.4 g/dL (ref 30.0–36.0)
MCV: 104 fL — ABNORMAL HIGH (ref 80.0–100.0)
Platelets: 144 10*3/uL — ABNORMAL LOW (ref 150–400)
RBC: 4.49 MIL/uL (ref 4.22–5.81)
RDW: 15 % (ref 11.5–15.5)
WBC: 4.5 10*3/uL (ref 4.0–10.5)
nRBC: 0 % (ref 0.0–0.2)

## 2021-01-18 LAB — MAGNESIUM: Magnesium: 1.8 mg/dL (ref 1.7–2.4)

## 2021-01-18 LAB — HEPARIN LEVEL (UNFRACTIONATED): Heparin Unfractionated: 0.42 IU/mL (ref 0.30–0.70)

## 2021-01-18 MED ORDER — APIXABAN 5 MG PO TABS
5.0000 mg | ORAL_TABLET | Freq: Two times a day (BID) | ORAL | Status: DC
  Administered 2021-01-18: 5 mg via ORAL
  Filled 2021-01-18: qty 1

## 2021-01-18 MED ORDER — METOPROLOL SUCCINATE ER 25 MG PO TB24: 25.0000 mg | ORAL_TABLET | Freq: Every day | ORAL | 1 refills | Status: DC

## 2021-01-18 MED ORDER — LOSARTAN POTASSIUM 25 MG PO TABS: 12.5000 mg | ORAL_TABLET | Freq: Every day | ORAL | 1 refills | Status: DC

## 2021-01-18 MED ORDER — APIXABAN 5 MG PO TABS: 5.0000 mg | ORAL_TABLET | Freq: Two times a day (BID) | ORAL | 1 refills | Status: DC

## 2021-01-18 MED ORDER — LOSARTAN POTASSIUM 25 MG PO TABS
12.5000 mg | ORAL_TABLET | Freq: Every day | ORAL | Status: DC
  Administered 2021-01-18: 12.5 mg via ORAL
  Filled 2021-01-18: qty 1

## 2021-01-18 NOTE — Progress Notes (Signed)
ANTICOAGULATION CONSULT NOTE -   Pharmacy Consult for heparin Indication: pulmonary embolus  Allergies  Allergen Reactions  . Tramadol     Felt sluggish and ineffective   . Darvocet [Propoxyphene N-Acetaminophen] Palpitations    Patient Measurements: Height: 5\' 9"  (175.3 cm) Weight: 68.3 kg (150 lb 9.2 oz) IBW/kg (Calculated) : 70.7 Heparin Dosing Weight: 69.4 kg  Vital Signs: Temp: 97.4 F (36.3 C) (04/23 0740) Temp Source: Oral (04/23 0740) BP: 108/83 (04/23 0600) Pulse Rate: 99 (04/23 0740)  Labs: Recent Labs    01/15/21 2121 01/16/21 0006 01/16/21 0238 01/16/21 1659 01/16/21 2248 01/17/21 0454 01/18/21 0355  HGB  --   --  15.6  --   --  15.8 15.6  HCT  --   --  47.0  --   --  47.3 46.7  PLT  --   --  145*  --   --  149* 144*  APTT  --   --  56*  --   --   --   --   LABPROT  --   --  14.0  --   --   --   --   INR  --   --  1.1  --   --   --   --   HEPARINUNFRC  --   --   --    < > 0.45 0.38 0.42  CREATININE  --   --  0.92  --   --  1.15 1.16  TROPONINIHS 56* 56* 54*  --   --   --   --    < > = values in this interval not displayed.    Estimated Creatinine Clearance: 65.4 mL/min (by C-G formula based on SCr of 1.16 mg/dL).   Medical History: Past Medical History:  Diagnosis Date  . Chronic lower back pain    a. Followed by pain management at [facility].  . Colon cancer [facility])    rectal cancer  . Coronary artery disease    a. 03/2013: abnl nuc -> LHC s/p DES to LCx, residual moderate disease in LAD (med rx unless refractory angina). b. Not on BB due to bradycardia.  Marland Kitchen DVT (deep venous thrombosis) ([place]) ~ 2013  . Dysrhythmia    AFib  . GERD (gastroesophageal reflux disease)   . H/O necrotising fasciitis   . History of blood transfusion    "once; after throwing up alot of blood" (04/17/2013)  . Hypertension   . LV dysfunction    a. EF 45% in 03/2013.  Marland Kitchen PAD (peripheral artery disease) ([place])    a. Occlusion of the right internal iliac artery, with  significant atherosclerosis in the left internal iliac which was not amenable to reconstruction per notes from [place] in place 05/30/2020  . Pulmonary nodules 09/30/2014  . Rectal cancer ([place])   . Tobacco abuse     Medications:  Medications Prior to Admission  Medication Sig Dispense Refill Last Dose  . alum & mag hydroxide-simeth (MAALOX/MYLANTA) 200-200-20 MG/5 SUSP Take 1 tablespoon (21ml) by mouth before meals.  If 1 doesn't help, you can take 2 tablespoons 480 mL 2   . apixaban (ELIQUIS) 5 MG TABS tablet Take 1 tablet (5 mg total) by mouth 2 (two) times daily.   01/15/2021 at 0900  . aspirin EC 81 MG tablet Take 81 mg by mouth daily.   01/15/2021 at 0900  . DURVALUMAB IV Inject into the vein every 14 (fourteen) days.     Marland Kitchen  HYDROcodone-acetaminophen (NORCO) 10-325 MG tablet Take 1 tablet by mouth every 4 (four) hours as needed. 180 tablet 0 01/15/2021 at Unknown time  . isosorbide mononitrate (IMDUR) 30 MG 24 hr tablet TAKE 1/2 TABLET BY MOUTH ONCE DAILY. (Patient taking differently: Take 15 mg by mouth daily.) 15 tablet 3 01/15/2021 at Unknown time  . lidocaine (XYLOCAINE) 2 % solution      . lisinopril (ZESTRIL) 20 MG tablet Take 20 mg by mouth daily.   01/15/2021 at Unknown time  . NITROSTAT 0.4 MG SL tablet PLACE 1 TABLET UNDER THE TONGUE AS NEEDED FOR CHEST PAIN UP TO 3 DOSES 25 tablet 3   . omeprazole (PRILOSEC) 40 MG capsule Take 40 mg by mouth daily.   01/15/2021 at Unknown time  . gabapentin (NEURONTIN) 300 MG capsule Take 1 capsule by mouth 2 (two) times daily. (Patient not taking: Reported on 01/16/2021)   Not Taking at Unknown time  . sucralfate (CARAFATE) 1 GM/10ML suspension Take 10 mLs (1 g total) by mouth every 3 (three) hours as needed. (Patient not taking: Reported on 01/16/2021) 960 mL 1 Not Taking at Unknown time  . traMADol (ULTRAM) 50 MG tablet Take 1 tablet (50 mg total) by mouth every 6 (six) hours as needed. (Patient not taking: Reported on 01/16/2021) 30  tablet 0 Not Taking at Unknown time    Assessment: Pharmacy consulted to dose heparin in patient with pulmonary embolus. CTA chest showed bilateral LL emboli.  Patient on Apixaban prior to admission and changed to warfarin- subtherapeutic INR today. He received warfarin and therapeutic dose of lovenox 4/21 0020.  HL 0.42- therapeutic  Goal of Therapy:  Heparin level 0.3-0.7 units/ml Monitor platelets by anticoagulation protocol: Yes   Plan:  Continue heparin infusion at 1150 units/hr. Check anti-Xa level  daily while on heparin. Continue to monitor H&H and platelets.   Hart Robinsons, PharmD Clinical Pharmacist 01/18/2021 7:46 AM

## 2021-01-18 NOTE — Progress Notes (Signed)
IV access removed without incident. Patient given all discharge paperwork. Given evening new ordered medications before discharge. Patient dressed himself and I rolled down to car by wheelchair.

## 2021-01-18 NOTE — Discharge Summary (Signed)
Physician Discharge Summary  Juan Wheeler KZS:[id number] DOB: [birth year] DOA: 01/15/2021  PCP: [name], MD  Admit date: 01/15/2021 Discharge date: 01/18/2021  Admitted From: Home  Disposition:  Home   Recommendations for Outpatient Follow-up:  1. Follow up with PCP in 1-2 weeks 2. Please obtain BMP/CBC in one week     Discharge Condition: Stable CODE STATUS: FULL Diet recommendation: Heart Healthy   Brief/Interim Summary: 60 year old male with a history of stage IIa rectal adenocarcinoma status postresection and diverting colostomy, ischemic cardiomyopathy, coronary artery disease with stent, paroxysmal atrial fibrillation, stage IIIa squamous cell carcinoma of the right lung, hyperlipidemia, and tobacco abuse presenting with exertional dyspnea for the past 2 to 3 weeks. The patient has been receiving immunotherapy with durvalumab (last dose 01/02/21) from Dr. Cephas Darby his [facility] cancer, and the patient was scheduled for a CT of the chest to reassess his response to immunotherapy. The CT chest on 01/15/2021 suggested a possible filling defect in the LLL.As result, the patient was sent to the emergency department for CTA of the chest.The patient states that he has had worsening shortness of breath for the past 2 to 3 weeks having difficulty even walking across the room without shortness of breath. He has had some exertional chest pressure, but primarily he states that the shortness of breath is limiting his activity. He denies any fevers, chills, headache, nausea, vomiting, diarrhea, abdominal pain, dysuria, hematuria. He denies any hemoptysis, but he does have a cough with yellow sputum which has been chronic. Notably, the patient states that he has been taking his apixaban once daily for as long as he can remember rather than twice daily. In the emergency department, the patient was afebrile hemodynamically stable albeit with soft blood pressures with  SBP90-100s.Oxygen saturation was 95% on 2 L. The patient was noted to have atrial fibrillation with RVR and started on a diltiazem drip. CTA chest showed bilateral LLemboli without RV strain. There was focal subpleural consolidation in the RLLconcerning for infection versus malignancy. There is a small right pleural effusion with right hilar and subcarinal lymphadenopathy. There was numerous hepatic metastases.   Discharge Diagnoses:  Acute pulmonary embolus -Started IV heparin>>transitioned to apixaban after 48 hours -discuss with Dr. Marsh Dolly to send home with apixaban 5 mg bid as patient was only taking it once daily PTA -01/17/21 Echo--EF 30-35%, global HK, mild decreased RV function, mild MR -11/16/18 Echo--EF 50-55% -do not feel that clinically that PE has directly caused the new Echo findings; ??effect from chemo vs ischemic disease -start low dose ARB if BP able to tolerate -CTA chest as discussed above  Acute respiratory failure with hypoxia -saturation in upper 80's on RA -due to PE in setting of underlying COPD and malignancy -was on 2L -weaned to RA -ambulatory pulse ox on day of d/c on RA did not show desaturation <92%  Atrial fibrillation with RVR -Continue anticoagulation -Transition off IV diltiazem to po metoprolol due to decrease in EF -now rate controlled -TSH 0.882  Cardiomyopathy, type unspecified -01/17/21 Echo EF 30-35% -start low dose losartan -started metoprolol -repeat echo in 3 months -outpatient cardiology follow up  Thrombocytopenia -This may be related to his immunotherapy vs new PE (consumption) -Has been chronic since 10/09/2020 -Serum D32 202 -Folic acid 54.2 -TSH 7.062  Metastatic squamous cell carcinoma of the right lung -01/15/2021 CT chest showed multiple new peripheral enhancing liver metastases and enlarging peripheralRLLlung lesion -Follow-up with Dr. Delton Coombes  Chronic pain syndrome/neoplastic pain -Continue home  dose of hydrocodone10/325 -  PMDPreviewed--no red flags  Right pleural effusion -This is likely secondary to his PE as well as a malignant effusion -Clinically euvolemic  Stage II gluteal wound -This has been chronic since surgical intervention for his necrotizing fasciitis -He is followed by Dr. Vania Rea [facility]  Goals of Care -palliative consulted -after [place] discussion-->continue full scope of care   Discharge Instructions   Allergies as of 01/18/2021      Reactions   Tramadol    Felt sluggish and ineffective    Darvocet [propoxyphene N-acetaminophen] Palpitations      Medication List    STOP taking these medications   gabapentin 300 MG capsule Commonly known as: NEURONTIN   lisinopril 20 MG tablet Commonly known as: ZESTRIL   sucralfate 1 GM/10ML suspension Commonly known as: Carafate   traMADol 50 MG tablet Commonly known as: ULTRAM     TAKE these medications   alum & mag hydroxide-simeth 200-200-20 MG/5 Susp Commonly known as: MAALOX/MYLANTA Take 1 tablespoon (38ml) by mouth before meals.  If 1 doesn't help, you can take 2 tablespoons   apixaban 5 MG Tabs tablet Commonly known as: ELIQUIS Take 1 tablet (5 mg total) by mouth 2 (two) times daily.   aspirin EC 81 MG tablet Take 81 mg by mouth daily.   DURVALUMAB IV Inject into the vein every 14 (fourteen) days.   HYDROcodone-acetaminophen 10-325 MG tablet Commonly known as: NORCO Take 1 tablet by mouth every 4 (four) hours as needed.   isosorbide mononitrate 30 MG 24 hr tablet Commonly known as: IMDUR TAKE 1/2 TABLET BY MOUTH ONCE DAILY. What changed:   how much to take  how to take this  when to take this  additional instructions   lidocaine 2 % solution Commonly known as: XYLOCAINE   losartan 25 MG tablet Commonly known as: COZAAR Take 0.5 tablets (12.5 mg total) by mouth daily.   metoprolol succinate 25 MG 24 hr tablet Commonly known as: TOPROL-XL Take 1 tablet (25 mg total) by  mouth daily.   Nitrostat 0.4 MG SL tablet Generic drug: nitroGLYCERIN PLACE 1 TABLET UNDER THE TONGUE AS NEEDED FOR CHEST PAIN UP TO 3 DOSES   omeprazole 40 MG capsule Commonly known as: PRILOSEC Take 40 mg by mouth daily.       Allergies  Allergen Reactions  . Tramadol     Felt sluggish and ineffective   . Darvocet [Propoxyphene N-Acetaminophen] Palpitations    Consultations:  none   Procedures/Studies: CT Chest W Contrast  Result Date: 01/15/2021 CLINICAL DATA:  Non-small cell lung cancer diagnosed in 2021. Chemotherapy completed. Immunotherapy ongoing. EXAM: CT CHEST WITH CONTRAST TECHNIQUE: Multidetector CT imaging of the chest was performed during intravenous contrast administration. CONTRAST:  20mL OMNIPAQUE IOHEXOL 300 MG/ML  SOLN COMPARISON:  CT chest 11/18/2020 and 09/09/2020.  PET-CT 04/29/2020. FINDINGS: Cardiovascular: Although not performed as a pulmonary CTA, there is new ill-defined decreased enhancement within the segmental and subsegmental branches of the left lower lobe pulmonary artery (image 104/2), suspicious for pulmonary embolism, possibly subacute. Left subclavian Port-A-Cath extends to the mid SVC. There is atherosclerosis of the aorta, great vessels and coronary arteries. The heart size is normal. There is no pericardial effusion. Mediastinum/Nodes: There are no enlarged mediastinal, hilar or axillary lymph nodes.Small pretracheal and subcarinal lymph nodes are unchanged. The thyroid gland, trachea and esophagus demonstrate no significant findings. Lungs/Pleura: Stable small right pleural effusion and adjacent pleural thickening. Again demonstrated is moderate to severe centrilobular and paraseptal emphysema with mild diffuse central airway  thickening. Progressive subpleural consolidation posteriorly in the right lower lobe, largely obscuring the previously demonstrated mass on the lung windows. On the soft tissue windows, there is a thick walled nodular density  in this area with probable peripheral enhancement which appears slightly larger, measuring 3.5 x 2.4 cm on image 101/2. Multiple additional pulmonary nodules bilaterally are unchanged, largest measuring 6 mm in the left lower lobe on image 122/4. No other enlarging nodules identified. Upper abdomen: Multiple new peripherally enhancing hepatic lesions highly suspicious for metastatic disease. Largest lesion is in the left hepatic lobe, measuring 2.5 x 2.5 cm on image 168/2. There are multiple smaller lesions, measuring 9 mm on image 150/2, 11 mm on image 157/2 and 15 mm on image 160/2. There is stable nodularity of the adrenal glands. Musculoskeletal/Chest wall: There is no chest wall mass or suspicious osseous finding. IMPRESSION: 1. Multiple new peripherally enhancing liver lesions highly suspicious for hepatic metastatic disease. 2. Enlarging, peripherally enhancing pulmonary lesion posteriorly in the right lower lobe, largely obscured by surrounding consolidation, but suspicious for local recurrence of lung cancer, especially in light of the abdominal findings. Follow-up PET-CT may be helpful for further staging. 3. Additional pulmonary nodules are unchanged. 4. Suspected new ill-defined filling defect within the segmental and subsegmental branches of the left lower lobe pulmonary artery, suspicious for pulmonary embolism, possibly subacute. 5. Coronary and Aortic Atherosclerosis (ICD10-I70.0). Emphysema (ICD10-J43.9). 6. Critical Value/emergent results were called by telephone at the time of interpretation on 01/15/2021 at 3:20 pm to provider [facility] , who verbally acknowledged these results. Electronically Signed   By: Richardean Sale M.D.   On: 01/15/2021 15:26   CT Angio Chest PE W and/or Wo Contrast  Result Date: 01/15/2021 CLINICAL DATA:  60 year old male with concern for pulmonary embolism. History of lung cancer. EXAM: CT ANGIOGRAPHY CHEST WITH CONTRAST TECHNIQUE: Multidetector CT imaging  of the chest was performed using the standard protocol during bolus administration of intravenous contrast. Multiplanar CT image reconstructions and MIPs were obtained to evaluate the vascular anatomy. CONTRAST:  70mL OMNIPAQUE IOHEXOL 350 MG/ML SOLN COMPARISON:  Chest CT dated 01/15/2021. FINDINGS: Cardiovascular: Mildly dilated right atrium with retrograde flow of contrast from the right atrium into the IVC suggestive of a degree of right heart dysfunction. No pericardial effusion. There is coronary vascular calcification. Mild atherosclerotic calcification of the thoracic aorta. Left lower lobe segmental pulmonary artery emboli as seen on the earlier CT. Additional linear and nonocclusive emboli in the segmental and subsegmental branches of the right lower lobe. No CT evidence of right heart dysfunction. Mediastinum/Nodes: Right hilar adenopathy as well as mildly enlarged subcarinal lymph node as seen on the earlier CT. The esophagus and the thyroid gland are grossly unremarkable. No mediastinal fluid collection. Lungs/Pleura: Background of severe emphysema. Small right pleural effusion with associated subsegmental compressive atelectasis of the right lower lobe. Focal area of subpleural consolidation in the right lower lobe (256/7) as seen on the earlier CT. Follow-up as per recommendation of earlier CT. There is a 6 mm nodule along the minor fissure (111/6). No pneumothorax. The central airways are patent. Upper Abdomen: Multiple hepatic hypodense lesions most consistent with metastatic disease. Postsurgical changes of bowel with right upper quadrant ostomy. Musculoskeletal: Degenerative changes of the spine. No acute osseous pathology. Left-sided Port-A-Cath with tip over central SVC. Review of the MIP images confirms the above findings. IMPRESSION: 1. Bilateral lower lobe segmental and subsegmental pulmonary artery emboli. No CT evidence of right heart dysfunction. 2. Small right pleural  effusion with  associated subsegmental compressive atelectasis of the right lower lobe. 3. Focal area of subpleural consolidation in the right lower lobe as seen on the earlier CT. Findings may be infectious in etiology or represent recurrence of malignancy. Follow-up as per recommendation of earlier chest CT. 4. Right hilar and subcarinal adenopathy as seen on the earlier CT. 5. Multiple hepatic metastatic disease. 6. Aortic Atherosclerosis (ICD10-I70.0) and Emphysema (ICD10-J43.9). These results were called by telephone at the time of interpretation on 01/15/2021 at 7:46 pm to provider [facility] , who verbally acknowledged these results. Electronically Signed   By: Anner Crete M.D.   On: 01/15/2021 19:51   ECHOCARDIOGRAM COMPLETE  Result Date: 01/17/2021    ECHOCARDIOGRAM REPORT   Patient Name:   Juan Wheeler Date of Exam: 01/17/2021 Medical Rec #:  [id number]     Height:       69.0 in Accession #:    [id number]    Weight:       148.8 lb Date of Birth:  Mar 03, 1961     BSA:          1.822 m Patient Age:    74 years      BP:           106/65 mmHg Patient Gender: M             HR:           74 bpm. Exam Location:  [facility] Procedure: 2D Echo Indications:    Pulmonary Embolus I26.09  History:        Patient has prior history of Echocardiogram examinations, most                 recent 11/16/2017. CAD, Signs/Symptoms:Chest Pain; Risk                 Factors:Hypertension and Current Smoker. Pulmonary Embolism,                 Elevated Troponin.  Sonographer:    Leavy Cella RDCS (AE) Referring Phys: [id number] OLADAPO ADEFESO IMPRESSIONS  1. Left ventricular ejection fraction, by estimation, is 30 to 35%. The left ventricle has moderately decreased function. The left ventricle demonstrates global hypokinesis with some regional variation. Left ventricular diastolic parameters are indeterminate in the setting of atrial fibrillation.  2. Right ventricular systolic function is moderately reduced. The right ventricular size is  normal. Tricuspid regurgitation signal is inadequate for assessing PA pressure.  3. Left atrial size was mildly dilated.  4. The mitral valve is grossly normal. Mild mitral valve regurgitation.  5. The aortic valve was not well visualized. There is mild calcification of the aortic valve. Aortic valve regurgitation is not visualized.  6. The inferior vena cava is normal in size with greater than 50% respiratory variability, suggesting right atrial pressure of 3 mmHg. FINDINGS  Left Ventricle: Left ventricular ejection fraction, by estimation, is 30 to 35%. The left ventricle has moderately decreased function. The left ventricle demonstrates global hypokinesis. The left ventricular internal cavity size was normal in size. There is no left ventricular hypertrophy. Left ventricular diastolic parameters are indeterminate. Right Ventricle: The right ventricular size is normal. No increase in right ventricular wall thickness. Right ventricular systolic function is moderately reduced. Tricuspid regurgitation signal is inadequate for assessing PA pressure. Left Atrium: Left atrial size was mildly dilated. Right Atrium: Right atrial size was normal in size. Pericardium: There is no evidence of pericardial effusion. Mitral Valve: The mitral valve is grossly  normal. Mild mitral valve regurgitation. Tricuspid Valve: The tricuspid valve is grossly normal. Tricuspid valve regurgitation is trivial. Aortic Valve: The aortic valve was not well visualized. There is mild calcification of the aortic valve. There is mild to moderate aortic valve annular calcification. Aortic valve regurgitation is not visualized. Pulmonic Valve: The pulmonic valve was not well visualized. Pulmonic valve regurgitation is not visualized. Aorta: The aortic root is normal in size and structure. Venous: The inferior vena cava is normal in size with greater than 50% respiratory variability, suggesting right atrial pressure of 3 mmHg. IAS/Shunts: No atrial  level shunt detected by color flow Doppler.  LEFT VENTRICLE PLAX 2D LVIDd:         4.77 cm  Diastology LVIDs:         4.01 cm  LV e' medial:    9.25 cm/s LV PW:         1.06 cm  LV E/e' medial:  7.4 LV IVS:        1.08 cm  LV e' lateral:   16.20 cm/s LVOT diam:     1.90 cm  LV E/e' lateral: 4.3 LVOT Area:     2.84 cm  RIGHT VENTRICLE RV S prime:     9.14 cm/s TAPSE (M-mode): 1.6 cm LEFT ATRIUM           Index       RIGHT ATRIUM           Index LA diam:      3.60 cm 1.98 cm/m  RA Area:     19.80 cm LA Vol (A2C): 68.6 ml 37.65 ml/m RA Volume:   57.50 ml  31.56 ml/m LA Vol (A4C): 64.9 ml 35.62 ml/m   AORTA Ao Root diam: 2.70 cm MITRAL VALVE MV Area (PHT): 2.77 cm    SHUNTS MV Decel Time: 274 msec    Systemic Diam: 1.90 cm MV E velocity: 68.90 cm/s Rozann Lesches MD Electronically signed by Rozann Lesches MD Signature Date/Time: 01/17/2021/12:17:50 PM    Final         Discharge Exam: Vitals:   01/18/21 1130 01/18/21 1200  BP:  110/75  Pulse: (!) 53 89  Resp:    Temp: (!) 97.5 F (36.4 C)   SpO2: 94% 92%   Vitals:   01/18/21 0935 01/18/21 1100 01/18/21 1130 01/18/21 1200  BP: 93/62 (!) 83/48  110/75  Pulse: 92 67 (!) 53 89  Resp:      Temp:   (!) 97.5 F (36.4 C)   TempSrc:   Oral   SpO2: 92% 92% 94% 92%  Weight:      Height:        General: Pt is alert, awake, not in acute distress Cardiovascular: RRR, S1/S2 +, no rubs, no gallops Respiratory: bibasilar rales. No wheeze.  Diminished BS bilateral Abdominal: Soft, NT, ND, bowel sounds + Extremities: no edema, no cyanosis   The results of significant diagnostics from this hospitalization (including imaging, microbiology, ancillary and laboratory) are listed below for reference.    Significant Diagnostic Studies: CT Chest W Contrast  Result Date: 01/15/2021 CLINICAL DATA:  Non-small cell lung cancer diagnosed in 2021. Chemotherapy completed. Immunotherapy ongoing. EXAM: CT CHEST WITH CONTRAST TECHNIQUE: Multidetector CT  imaging of the chest was performed during intravenous contrast administration. CONTRAST:  71mL OMNIPAQUE IOHEXOL 300 MG/ML  SOLN COMPARISON:  CT chest 11/18/2020 and 09/09/2020.  PET-CT 04/29/2020. FINDINGS: Cardiovascular: Although not performed as a pulmonary CTA, there is new ill-defined decreased enhancement  within the segmental and subsegmental branches of the left lower lobe pulmonary artery (image 104/2), suspicious for pulmonary embolism, possibly subacute. Left subclavian Port-A-Cath extends to the mid SVC. There is atherosclerosis of the aorta, great vessels and coronary arteries. The heart size is normal. There is no pericardial effusion. Mediastinum/Nodes: There are no enlarged mediastinal, hilar or axillary lymph nodes.Small pretracheal and subcarinal lymph nodes are unchanged. The thyroid gland, trachea and esophagus demonstrate no significant findings. Lungs/Pleura: Stable small right pleural effusion and adjacent pleural thickening. Again demonstrated is moderate to severe centrilobular and paraseptal emphysema with mild diffuse central airway thickening. Progressive subpleural consolidation posteriorly in the right lower lobe, largely obscuring the previously demonstrated mass on the lung windows. On the soft tissue windows, there is a thick walled nodular density in this area with probable peripheral enhancement which appears slightly larger, measuring 3.5 x 2.4 cm on image 101/2. Multiple additional pulmonary nodules bilaterally are unchanged, largest measuring 6 mm in the left lower lobe on image 122/4. No other enlarging nodules identified. Upper abdomen: Multiple new peripherally enhancing hepatic lesions highly suspicious for metastatic disease. Largest lesion is in the left hepatic lobe, measuring 2.5 x 2.5 cm on image 168/2. There are multiple smaller lesions, measuring 9 mm on image 150/2, 11 mm on image 157/2 and 15 mm on image 160/2. There is stable nodularity of the adrenal glands.  Musculoskeletal/Chest wall: There is no chest wall mass or suspicious osseous finding. IMPRESSION: 1. Multiple new peripherally enhancing liver lesions highly suspicious for hepatic metastatic disease. 2. Enlarging, peripherally enhancing pulmonary lesion posteriorly in the right lower lobe, largely obscured by surrounding consolidation, but suspicious for local recurrence of lung cancer, especially in light of the abdominal findings. Follow-up PET-CT may be helpful for further staging. 3. Additional pulmonary nodules are unchanged. 4. Suspected new ill-defined filling defect within the segmental and subsegmental branches of the left lower lobe pulmonary artery, suspicious for pulmonary embolism, possibly subacute. 5. Coronary and Aortic Atherosclerosis (ICD10-I70.0). Emphysema (ICD10-J43.9). 6. Critical Value/emergent results were called by telephone at the time of interpretation on 01/15/2021 at 3:20 pm to provider [facility] , who verbally acknowledged these results. Electronically Signed   By: Richardean Sale M.D.   On: 01/15/2021 15:26   CT Angio Chest PE W and/or Wo Contrast  Result Date: 01/15/2021 CLINICAL DATA:  60 year old male with concern for pulmonary embolism. History of lung cancer. EXAM: CT ANGIOGRAPHY CHEST WITH CONTRAST TECHNIQUE: Multidetector CT imaging of the chest was performed using the standard protocol during bolus administration of intravenous contrast. Multiplanar CT image reconstructions and MIPs were obtained to evaluate the vascular anatomy. CONTRAST:  72mL OMNIPAQUE IOHEXOL 350 MG/ML SOLN COMPARISON:  Chest CT dated 01/15/2021. FINDINGS: Cardiovascular: Mildly dilated right atrium with retrograde flow of contrast from the right atrium into the IVC suggestive of a degree of right heart dysfunction. No pericardial effusion. There is coronary vascular calcification. Mild atherosclerotic calcification of the thoracic aorta. Left lower lobe segmental pulmonary artery emboli as  seen on the earlier CT. Additional linear and nonocclusive emboli in the segmental and subsegmental branches of the right lower lobe. No CT evidence of right heart dysfunction. Mediastinum/Nodes: Right hilar adenopathy as well as mildly enlarged subcarinal lymph node as seen on the earlier CT. The esophagus and the thyroid gland are grossly unremarkable. No mediastinal fluid collection. Lungs/Pleura: Background of severe emphysema. Small right pleural effusion with associated subsegmental compressive atelectasis of the right lower lobe. Focal area of subpleural consolidation in the  right lower lobe (256/7) as seen on the earlier CT. Follow-up as per recommendation of earlier CT. There is a 6 mm nodule along the minor fissure (111/6). No pneumothorax. The central airways are patent. Upper Abdomen: Multiple hepatic hypodense lesions most consistent with metastatic disease. Postsurgical changes of bowel with right upper quadrant ostomy. Musculoskeletal: Degenerative changes of the spine. No acute osseous pathology. Left-sided Port-A-Cath with tip over central SVC. Review of the MIP images confirms the above findings. IMPRESSION: 1. Bilateral lower lobe segmental and subsegmental pulmonary artery emboli. No CT evidence of right heart dysfunction. 2. Small right pleural effusion with associated subsegmental compressive atelectasis of the right lower lobe. 3. Focal area of subpleural consolidation in the right lower lobe as seen on the earlier CT. Findings may be infectious in etiology or represent recurrence of malignancy. Follow-up as per recommendation of earlier chest CT. 4. Right hilar and subcarinal adenopathy as seen on the earlier CT. 5. Multiple hepatic metastatic disease. 6. Aortic Atherosclerosis (ICD10-I70.0) and Emphysema (ICD10-J43.9). These results were called by telephone at the time of interpretation on 01/15/2021 at 7:46 pm to provider [facility] , who verbally acknowledged these results. Electronically  Signed   By: Anner Crete M.D.   On: 01/15/2021 19:51   ECHOCARDIOGRAM COMPLETE  Result Date: 01/17/2021    ECHOCARDIOGRAM REPORT   Patient Name:   DAYTEN JUBA Date of Exam: 01/17/2021 Medical Rec #:  [id number]     Height:       69.0 in Accession #:    [id number]    Weight:       148.8 lb Date of Birth:  12/04/1960     BSA:          1.822 m Patient Age:    55 years      BP:           106/65 mmHg Patient Gender: M             HR:           74 bpm. Exam Location:  [facility] Procedure: 2D Echo Indications:    Pulmonary Embolus I26.09  History:        Patient has prior history of Echocardiogram examinations, most                 recent 11/16/2017. CAD, Signs/Symptoms:Chest Pain; Risk                 Factors:Hypertension and Current Smoker. Pulmonary Embolism,                 Elevated Troponin.  Sonographer:    Leavy Cella RDCS (AE) Referring Phys: [id number] OLADAPO ADEFESO IMPRESSIONS  1. Left ventricular ejection fraction, by estimation, is 30 to 35%. The left ventricle has moderately decreased function. The left ventricle demonstrates global hypokinesis with some regional variation. Left ventricular diastolic parameters are indeterminate in the setting of atrial fibrillation.  2. Right ventricular systolic function is moderately reduced. The right ventricular size is normal. Tricuspid regurgitation signal is inadequate for assessing PA pressure.  3. Left atrial size was mildly dilated.  4. The mitral valve is grossly normal. Mild mitral valve regurgitation.  5. The aortic valve was not well visualized. There is mild calcification of the aortic valve. Aortic valve regurgitation is not visualized.  6. The inferior vena cava is normal in size with greater than 50% respiratory variability, suggesting right atrial pressure of 3 mmHg. FINDINGS  Left Ventricle: Left ventricular ejection fraction, by estimation,  is 30 to 35%. The left ventricle has moderately decreased function. The left ventricle demonstrates  global hypokinesis. The left ventricular internal cavity size was normal in size. There is no left ventricular hypertrophy. Left ventricular diastolic parameters are indeterminate. Right Ventricle: The right ventricular size is normal. No increase in right ventricular wall thickness. Right ventricular systolic function is moderately reduced. Tricuspid regurgitation signal is inadequate for assessing PA pressure. Left Atrium: Left atrial size was mildly dilated. Right Atrium: Right atrial size was normal in size. Pericardium: There is no evidence of pericardial effusion. Mitral Valve: The mitral valve is grossly normal. Mild mitral valve regurgitation. Tricuspid Valve: The tricuspid valve is grossly normal. Tricuspid valve regurgitation is trivial. Aortic Valve: The aortic valve was not well visualized. There is mild calcification of the aortic valve. There is mild to moderate aortic valve annular calcification. Aortic valve regurgitation is not visualized. Pulmonic Valve: The pulmonic valve was not well visualized. Pulmonic valve regurgitation is not visualized. Aorta: The aortic root is normal in size and structure. Venous: The inferior vena cava is normal in size with greater than 50% respiratory variability, suggesting right atrial pressure of 3 mmHg. IAS/Shunts: No atrial level shunt detected by color flow Doppler.  LEFT VENTRICLE PLAX 2D LVIDd:         4.77 cm  Diastology LVIDs:         4.01 cm  LV e' medial:    9.25 cm/s LV PW:         1.06 cm  LV E/e' medial:  7.4 LV IVS:        1.08 cm  LV e' lateral:   16.20 cm/s LVOT diam:     1.90 cm  LV E/e' lateral: 4.3 LVOT Area:     2.84 cm  RIGHT VENTRICLE RV S prime:     9.14 cm/s TAPSE (M-mode): 1.6 cm LEFT ATRIUM           Index       RIGHT ATRIUM           Index LA diam:      3.60 cm 1.98 cm/m  RA Area:     19.80 cm LA Vol (A2C): 68.6 ml 37.65 ml/m RA Volume:   57.50 ml  31.56 ml/m LA Vol (A4C): 64.9 ml 35.62 ml/m   AORTA Ao Root diam: 2.70 cm MITRAL VALVE  MV Area (PHT): 2.77 cm    SHUNTS MV Decel Time: 274 msec    Systemic Diam: 1.90 cm MV E velocity: 68.90 cm/s Rozann Lesches MD Electronically signed by Rozann Lesches MD Signature Date/Time: 01/17/2021/12:17:50 PM    Final      Microbiology: Recent Results (from the past 240 hour(s))  Resp Panel by RT-PCR (Flu A&B, Covid) Nasopharyngeal Swab     Status: None   Collection Time: 01/15/21  9:47 PM   Specimen: Nasopharyngeal Swab; Nasopharyngeal(NP) swabs in vial transport medium  Result Value Ref Range Status   SARS Coronavirus 2 by RT PCR NEGATIVE NEGATIVE Final    Comment: (NOTE) SARS-CoV-2 target nucleic acids are NOT DETECTED.  The SARS-CoV-2 RNA is generally detectable in upper respiratory specimens during the acute phase of infection. The lowest concentration of SARS-CoV-2 viral copies this assay can detect is 138 copies/mL. A negative result does not preclude SARS-Cov-2 infection and should not be used as the sole basis for treatment or other patient management decisions. A negative result may occur with  improper specimen collection/handling, submission of specimen other than nasopharyngeal swab,  presence of viral mutation(s) within the areas targeted by this assay, and inadequate number of viral copies(<138 copies/mL). A negative result must be combined with clinical observations, patient history, and epidemiological information. The expected result is Negative.  Fact Sheet for Patients:  EntrepreneurPulse.com.au  Fact Sheet for Healthcare Providers:  IncredibleEmployment.be  This test is no t yet approved or cleared by the [place] FDA and  has been authorized for detection and/or diagnosis of SARS-CoV-2 by FDA under an Emergency Use Authorization (EUA). This EUA will remain  in effect (meaning this test can be used) for the duration of the COVID-19 declaration under Section 564(b)(1) of the Act, 21 U.S.C.section 360bbb-3(b)(1),  unless the authorization is terminated  or revoked sooner.       Influenza A by PCR NEGATIVE NEGATIVE Final   Influenza B by PCR NEGATIVE NEGATIVE Final    Comment: (NOTE) The Xpert Xpress SARS-CoV-2/FLU/RSV plus assay is intended as an aid in the diagnosis of influenza from Nasopharyngeal swab specimens and should not be used as a sole basis for treatment. Nasal washings and aspirates are unacceptable for Xpert Xpress SARS-CoV-2/FLU/RSV testing.  Fact Sheet for Patients: EntrepreneurPulse.com.au  Fact Sheet for Healthcare Providers: IncredibleEmployment.be  This test is not yet approved or cleared by the [place] FDA and has been authorized for detection and/or diagnosis of SARS-CoV-2 by FDA under an Emergency Use Authorization (EUA). This EUA will remain in effect (meaning this test can be used) for the duration of the COVID-19 declaration under Section 564(b)(1) of the Act, 21 U.S.C. section 360bbb-3(b)(1), unless the authorization is terminated or revoked.  Performed at [facility], [address] [place], [place] [address]   MRSA PCR Screening     Status: None   Collection Time: 01/16/21  5:47 PM   Specimen: Nasal Mucosa; Nasopharyngeal  Result Value Ref Range Status   MRSA by PCR NEGATIVE NEGATIVE Final    Comment:        The GeneXpert MRSA Assay (FDA approved for NASAL specimens only), is one component of a comprehensive MRSA colonization surveillance program. It is not intended to diagnose MRSA infection nor to guide or monitor treatment for MRSA infections. Performed at [facility], [address], [place], [place] [address]      Labs: Basic Metabolic Panel: Recent Labs  Lab 01/15/21 1820 01/16/21 0238 01/17/21 0454 01/18/21 0355  NA 136 136 135 135  K 4.0 4.1 4.2 4.6  CL 104 107 104 102  CO2 24 22 23 25   GLUCOSE 91 90 90 96  BUN 10 9 12 12   CREATININE 1.16 0.92 1.15 1.16  CALCIUM 8.8* 8.6* 8.6* 8.7*   MG  --  1.9  --  1.8  PHOS  --  3.2  --   --    Liver Function Tests: Recent Labs  Lab 01/15/21 1820 01/16/21 0238 01/18/21 0355  AST 20 19 18   ALT 18 17 16   ALKPHOS 68 61 64  BILITOT 1.0 1.2 0.6  PROT 7.2 6.6 6.8  ALBUMIN 3.1* 2.9* 3.1*   No results for input(s): LIPASE, AMYLASE in the last 168 hours. No results for input(s): AMMONIA in the last 168 hours. CBC: Recent Labs  Lab 01/15/21 1820 01/16/21 0238 01/17/21 0454 01/18/21 0355  WBC 5.2 5.8 4.6 4.5  NEUTROABS 3.9  --   --   --   HGB 16.7 15.6 15.8 15.6  HCT 50.0 47.0 47.3 46.7  MCV 104.0* 103.3* 103.1* 104.0*  PLT 154 145* 149* 144*  Cardiac Enzymes: No results for input(s): CKTOTAL, CKMB, CKMBINDEX, TROPONINI in the last 168 hours. BNP: Invalid input(s): POCBNP CBG: No results for input(s): GLUCAP in the last 168 hours.  Time coordinating discharge:  36 minutes  Signed:  Orson Eva, DO [facility] Pager: [phone] 01/18/2021, 1:07 PM

## 2021-01-20 ENCOUNTER — Other Ambulatory Visit (HOSPITAL_COMMUNITY): Payer: Self-pay | Admitting: Surgery

## 2021-01-20 DIAGNOSIS — C20 Malignant neoplasm of rectum: Secondary | ICD-10-CM

## 2021-01-20 DIAGNOSIS — C3491 Malignant neoplasm of unspecified part of right bronchus or lung: Secondary | ICD-10-CM

## 2021-01-20 MED ORDER — HYDROCODONE-ACETAMINOPHEN 10-325 MG PO TABS: 1.0000 | ORAL_TABLET | ORAL | 0 refills | Status: DC | PRN

## 2021-01-21 NOTE — Progress Notes (Signed)
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 Doppler.  LEFT VENTRICLE PLAX 2D LVIDd:  4.77 cm  Diastology LVIDs:         4.01 cm  LV e' medial:    9.25 cm/s LV PW:         1.06 cm  LV E/e' medial:  7.4 LV IVS:        1.08 cm  LV e' lateral:   16.20 cm/s LVOT diam:     1.90 cm  LV E/e' lateral: 4.3 LVOT Area:     2.84 cm  RIGHT VENTRICLE RV S prime:     9.14 cm/s TAPSE (M-mode): 1.6 cm LEFT ATRIUM           Index       RIGHT ATRIUM           Index LA diam:      3.60 cm 1.98 cm/m  RA Area:     19.80 cm LA Vol (A2C): 68.6 ml 37.65 ml/m RA Volume:   57.50 ml  31.56 ml/m LA Vol (A4C): 64.9 ml 35.62 ml/m   AORTA Ao Root diam: 2.70 cm MITRAL VALVE MV Area (PHT): 2.77 cm    SHUNTS MV Decel Time: 274 msec    Systemic Diam: 1.90 cm MV E velocity: 68.90 cm/s Rozann Lesches MD Electronically signed by Rozann Lesches MD Signature Date/Time: 01/17/2021/12:17:50 PM    Final      ASSESSMENT:  1. Stage IIa (UT3N0) rectal adenocarcinoma: -Xeloda plus radiation from 02/03/2011 through 03/13/2011. -LAR by [name], pathology [id number]. -XELOX was recommended but was not done secondary to pain  reasons. -Colostomy revision for stomal prolapse in January 2020 by [name]. -Last CEA 11.4 on 03/05/2020. -CTAP on 04/03/2020 shows postsurgical/treatment changes in the rectum, perirectal soft tissues with probable left-sided seton stitch suggesting history of perianal fistula with no evidence of abscess. New 14 mm nodular opacity posterior right lung base, incompletely visualized. Changes of avascular necrosis of the left femoral head. -PET scan on 04/29/2020 shows thick-walled cavitary mass in the right lower lobe measuring 4.3 x 3.1 cm. Hypermetabolic focus in the right hilum likely lymph node. Hypermetabolic activity in the subcarinal lymph node. Metabolic activity associated with presacral thickening extending into the left perianal region with a linear surgical seton. This is consistent with chronic presacral and left gluteal/perianal infection.  2. CKD: -Creatinine ranged between 1.3-1.6.  3. Tobacco abuse: -Patient smokes half pack per day for 45 years.  4. Stage IIIa right lung squamous cell carcinoma: -PET scan on 04/29/2020 shows right lower lobe thick-walled cavitary mass measuring 4.3 x 3.1 cm. Right middle lobe nodule 7 mm, unchanged from exam on 2019. Hypermetabolic right hilar lymph node with SUV 4.1. Hypermetabolic subcarinal lymph node 1.1 cm with SUV 3.4. -MRI of the brain on 05/21/2019 were negative for metastatic disease. -Right lower lobe biopsy on 05/08/2020 consistent with squamous cell carcinoma. -XRT started on 06/26/2020 with weekly carbotaxol on 06/27/2020. -XRT completed on 08/16/2020 with 5 weekly doses of carboplatin and Taxol. -CT chest showed cavitary mass measuring 3.1 x 2.4 cm, compared to the 5.9 x 3.9 cm. Left upper lobe lung nodule is unchanged. Left lower lobe lung nodule is stable. Subcarinal lymph nodes have not changed. Signs of hepatic steatosis with lobular hepatic contour is. -Imfinzi started on 09/12/2020.   PLAN:  1.Stage IIIa right  lung squamous cell carcinoma: -We have discussed the findings on the CT scan of the chest.  There is evidence of liver lesions along with incidental pulmonary embolism. - I have recommended checking a CEA level given his  history of colon cancer. - Have also recommended a PET CT scan. - We will also arrange for biopsy of liver lesions. - I have reviewed his labs today which showed normal LFTs except total bilirubin of 1.4.  CBC was normal. - I would hold off on his durvalumab indefinitely. - RTC after biopsy.  He may hold Eliquis 2 days prior to biopsy.  2.Diffuse body pain/left posterior hip pain: -Continue hydrocodone 10/325 every 6 hours as needed.  3. CKD: -Baseline creatinine between 1.2-1.4.  This is stable.  4. Neuropathy: -Right hand fingertip numbness is stable.  5.  Bilateral lower lobe segmental and subsegmental pulmonary embolism: - This was incidentally found on CT chest on 01/15/2021, confirmed with a CT angiogram. - Continue Eliquis twice daily.  6.  Cardiomyopathy: - Echocardiogram on 01/17/2021 shows ejection fraction 30-35%. - Continue metoprolol 25 mg daily and losartan 12.5 mg daily.   Orders placed this encounter:  No orders of the defined types were placed in this encounter.    [name], MD [place] [phone]   I, Milinda Antis, am acting as a scribe for Dr. Sanda Linger.  I, [name] MD, have reviewed the above documentation for accuracy and completeness, and I agree with the above.

## 2021-01-22 ENCOUNTER — Inpatient Hospital Stay (HOSPITAL_COMMUNITY): Payer: Medicare PPO

## 2021-01-22 ENCOUNTER — Other Ambulatory Visit: Payer: Self-pay

## 2021-01-22 ENCOUNTER — Inpatient Hospital Stay (HOSPITAL_BASED_OUTPATIENT_CLINIC_OR_DEPARTMENT_OTHER): Payer: Medicare PPO | Admitting: Hematology

## 2021-01-22 VITALS — BP 131/79 | HR 98 | Temp 97.0°F | Resp 18 | Wt 153.9 lb

## 2021-01-22 DIAGNOSIS — C20 Malignant neoplasm of rectum: Secondary | ICD-10-CM

## 2021-01-22 DIAGNOSIS — C3491 Malignant neoplasm of unspecified part of right bronchus or lung: Secondary | ICD-10-CM

## 2021-01-22 DIAGNOSIS — Z5112 Encounter for antineoplastic immunotherapy: Secondary | ICD-10-CM | POA: Diagnosis not present

## 2021-01-22 DIAGNOSIS — F17210 Nicotine dependence, cigarettes, uncomplicated: Secondary | ICD-10-CM | POA: Diagnosis not present

## 2021-01-22 DIAGNOSIS — Z95828 Presence of other vascular implants and grafts: Secondary | ICD-10-CM

## 2021-01-22 DIAGNOSIS — C3431 Malignant neoplasm of lower lobe, right bronchus or lung: Secondary | ICD-10-CM | POA: Diagnosis not present

## 2021-01-22 LAB — CBC WITH DIFFERENTIAL/PLATELET
Abs Immature Granulocytes: 0.02 10*3/uL (ref 0.00–0.07)
Basophils Absolute: 0 10*3/uL (ref 0.0–0.1)
Basophils Relative: 1 %
Eosinophils Absolute: 0.1 10*3/uL (ref 0.0–0.5)
Eosinophils Relative: 2 %
HCT: 46.7 % (ref 39.0–52.0)
Hemoglobin: 15.4 g/dL (ref 13.0–17.0)
Immature Granulocytes: 0 %
Lymphocytes Relative: 13 %
Lymphs Abs: 0.7 10*3/uL (ref 0.7–4.0)
MCH: 34.2 pg — ABNORMAL HIGH (ref 26.0–34.0)
MCHC: 33 g/dL (ref 30.0–36.0)
MCV: 103.8 fL — ABNORMAL HIGH (ref 80.0–100.0)
Monocytes Absolute: 0.7 10*3/uL (ref 0.1–1.0)
Monocytes Relative: 11 %
Neutro Abs: 4.3 10*3/uL (ref 1.7–7.7)
Neutrophils Relative %: 73 %
Platelets: 163 10*3/uL (ref 150–400)
RBC: 4.5 MIL/uL (ref 4.22–5.81)
RDW: 15.5 % (ref 11.5–15.5)
WBC: 5.9 10*3/uL (ref 4.0–10.5)
nRBC: 0 % (ref 0.0–0.2)

## 2021-01-22 LAB — COMPREHENSIVE METABOLIC PANEL
ALT: 24 U/L (ref 0–44)
AST: 24 U/L (ref 15–41)
Albumin: 3.3 g/dL — ABNORMAL LOW (ref 3.5–5.0)
Alkaline Phosphatase: 76 U/L (ref 38–126)
Anion gap: 9 (ref 5–15)
BUN: 13 mg/dL (ref 6–20)
CO2: 25 mmol/L (ref 22–32)
Calcium: 8.6 mg/dL — ABNORMAL LOW (ref 8.9–10.3)
Chloride: 104 mmol/L (ref 98–111)
Creatinine, Ser: 1.14 mg/dL (ref 0.61–1.24)
GFR, Estimated: >60 mL/min (ref >60)
Glucose, Bld: 100 mg/dL — ABNORMAL HIGH (ref 70–99)
Potassium: 4.2 mmol/L (ref 3.5–5.1)
Sodium: 138 mmol/L (ref 135–145)
Total Bilirubin: 1.4 mg/dL — ABNORMAL HIGH (ref 0.3–1.2)
Total Protein: 7.2 g/dL (ref 6.5–8.1)

## 2021-01-22 LAB — MAGNESIUM: Magnesium: 2 mg/dL (ref 1.7–2.4)

## 2021-01-22 MED ORDER — SODIUM CHLORIDE 0.9% FLUSH
10.0000 mL | Freq: Once | INTRAVENOUS | Status: AC
  Administered 2021-01-22: 10 mL via INTRAVENOUS

## 2021-01-22 MED ORDER — HEPARIN SOD (PORK) LOCK FLUSH 100 UNIT/ML IV SOLN
500.0000 [IU] | Freq: Once | INTRAVENOUS | Status: AC
  Administered 2021-01-22: 500 [IU] via INTRAVENOUS

## 2021-01-22 NOTE — Patient Instructions (Signed)
[place] at [facility] Discharge Instructions  You were seen and examined today by Dr. Delton Coombes. Dr. Delton Coombes discussed your most recent CT scan.  No treatment today. Dr. Delton Coombes is ordering a Liver biopsy and PET scan to see what the masses are in your liver.  Please follow up as scheduled.   Thank you for choosing [place] at [facility] to provide your oncology and hematology care.  To afford each patient quality time with our provider, please arrive at least 15 minutes before your scheduled appointment time.   If you have a lab appointment with the [facility] please come in thru the Main Entrance and check in at the main information desk.  You need to re-schedule your appointment should you arrive 10 or more minutes late.  We strive to give you quality time with our providers, and arriving late affects you and other patients whose appointments are after yours.  Also, if you no show three or more times for appointments you may be dismissed from the clinic at the providers discretion.     Again, thank you for choosing [facility].  Our hope is that these requests will decrease the amount of time that you wait before being seen by our physicians.       _____________________________________________________________  Should you have questions after your visit to [facility], please contact our office at [phone] and follow the prompts.  Our office hours are 8:00 a.m. and 4:30 p.m. Monday - Friday.  Please note that voicemails left after 4:00 p.m. may not be returned until the following business day.  We are closed weekends and major holidays.  You do have access to a nurse 24-7, just call the main number to the clinic [phone] and do not press any options, hold on the line and a nurse will answer the phone.    For prescription refill requests, have your pharmacy contact our office and allow 72 hours.     Due to Covid, you will need to wear a mask upon entering the hospital. If you do not have a mask, a mask will be given to you at the Main Entrance upon arrival. For doctor visits, patients may have 1 support person age 58 or older with them. For treatment visits, patients can not have anyone with them due to social distancing guidelines and our immunocompromised population.

## 2021-01-22 NOTE — Progress Notes (Signed)
Patient presents today for Imfinzi infusion.  Vital signs within parameters for treatment.  Labs pending.  Patient has no new complaints at this time.  Labs reviewed and within parameters for treatment.  Total Bilirubin noted to be elevated at 1.4.   No treatment today due to new liver masses per Dr. Delton Coombes.    Patient deaccessed.  No complaints at this time.  Discharge from clinic ambulatory in stable condition.  Alert and oriented X 3.  Follow up with [facility] as scheduled.

## 2021-01-22 NOTE — Progress Notes (Signed)
Patient was assessed by Dr. Delton Coombes and labs have been reviewed.  No treatment today. Dr. Delton Coombes is sending patient for biopsy of liver masses and PET scan. Primary RN and pharmacy aware.

## 2021-01-22 NOTE — Patient Instructions (Signed)
[facility]  Discharge Instructions: Thank you for choosing [facility] to provide your oncology and hematology care.  If you have a lab appointment with the [facility], please come in thru the Main Entrance and check in at the main information desk.  Wear comfortable clothing and clothing appropriate for easy access to any Portacath or PICC line.   We strive to give you quality time with your provider. You may need to reschedule your appointment if you arrive late (15 or more minutes).  Arriving late affects you and other patients whose appointments are after yours.  Also, if you miss three or more appointments without notifying the office, you may be dismissed from the clinic at the provider's discretion.      For prescription refill requests, have your pharmacy contact our office and allow 72 hours for refills to be completed.    Today you did not receive treatment.    To help prevent nausea and vomiting after your treatment, we encourage you to take your nausea medication as directed.  BELOW ARE SYMPTOMS THAT SHOULD BE REPORTED IMMEDIATELY: . *FEVER GREATER THAN 100.4 F (38 C) OR HIGHER . *CHILLS OR SWEATING . *NAUSEA AND VOMITING THAT IS NOT CONTROLLED WITH YOUR NAUSEA MEDICATION . *UNUSUAL SHORTNESS OF BREATH . *UNUSUAL BRUISING OR BLEEDING . *URINARY PROBLEMS (pain or burning when urinating, or frequent urination) . *BOWEL PROBLEMS (unusual diarrhea, constipation, pain near the anus) . TENDERNESS IN MOUTH AND THROAT WITH OR WITHOUT PRESENCE OF ULCERS (sore throat, sores in mouth, or a toothache) . UNUSUAL RASH, SWELLING OR PAIN  . UNUSUAL VAGINAL DISCHARGE OR ITCHING   Items with * indicate a potential emergency and should be followed up as soon as possible or go to the Emergency Department if any problems should occur.  Please show the CHEMOTHERAPY ALERT CARD or IMMUNOTHERAPY ALERT CARD at check-in to the Emergency Department and triage  nurse.  Should you have questions after your visit or need to cancel or reschedule your appointment, please contact [facility] [phone]  and follow the prompts.  Office hours are 8:00 a.m. to 4:30 p.m. Monday - Friday. Please note that voicemails left after 4:00 p.m. may not be returned until the following business day.  We are closed weekends and major holidays. You have access to a nurse at all times for urgent questions. Please call the main number to the clinic [phone] and follow the prompts.  For any non-urgent questions, you may also contact your provider using MyChart. We now offer e-Visits for anyone 68 and older to request care online for non-urgent symptoms. For details visit [email or web address].   Also download the MyChart app! Go to the app store, search "MyChart", open the app, select , and log in with your MyChart username and password.  Due to Covid, a mask is required upon entering the hospital/clinic. If you do not have a mask, one will be given to you upon arrival. For doctor visits, patients may have 1 support person aged 63 or older with them. For treatment visits, patients cannot have anyone with them due to current Covid guidelines and our immunocompromised population.

## 2021-01-23 ENCOUNTER — Encounter (HOSPITAL_COMMUNITY): Payer: Self-pay | Admitting: Radiology

## 2021-01-23 LAB — CEA: CEA: 633 ng/mL — ABNORMAL HIGH (ref 0.0–4.7)

## 2021-01-23 NOTE — Progress Notes (Signed)
[facility] Male, 60 y.o., 04-Feb-1961  MRN:  [id number] Phone:  [phone] Jerilynn Mages)       PCP:  [name], MD Coverage:  [facility] Medicare/[employer] Medicare Choice Ppo  Next Appt With Radiology (AP-PET CT 1) 02/03/2021 at 7:00 PM           RE: US Liver Biopsy Received: Today Criselda Peaches, MD  Garth Bigness D  Approved for US guided bx of liver lesion. Hx of both colon and lung cancer.   HKM        Previous Messages   ----- Message -----  From: Garth Bigness D  Sent: 01/22/2021 11:20 AM EDT  To: [facility] Procedure Requests  Subject: US Liver Biopsy                  Procedure: US Liver Biopsy   Reason: Squamous cell lung cancer, right, Malignant neoplasm of rectum, liver masses, hx of lung and colon cancer   History: CT in computer, NM PET scheduled for 02/03/21   Provider: [name]   Provider Contact: [phone]

## 2021-01-24 DIAGNOSIS — J9601 Acute respiratory failure with hypoxia: Secondary | ICD-10-CM | POA: Diagnosis not present

## 2021-01-24 DIAGNOSIS — I429 Cardiomyopathy, unspecified: Secondary | ICD-10-CM | POA: Diagnosis not present

## 2021-01-24 DIAGNOSIS — I2782 Chronic pulmonary embolism: Secondary | ICD-10-CM | POA: Diagnosis not present

## 2021-01-24 DIAGNOSIS — I480 Paroxysmal atrial fibrillation: Secondary | ICD-10-CM | POA: Diagnosis not present

## 2021-01-27 ENCOUNTER — Other Ambulatory Visit (HOSPITAL_COMMUNITY)
Admission: RE | Admit: 2021-01-27 | Discharge: 2021-01-27 | Disposition: A | Discharge status: Home / Self Care | Payer: Medicare PPO | Source: Ambulatory Visit | Attending: Internal Medicine | Admitting: Internal Medicine

## 2021-01-27 ENCOUNTER — Other Ambulatory Visit: Payer: Self-pay

## 2021-01-27 ENCOUNTER — Encounter (HOSPITAL_COMMUNITY): Payer: Self-pay | Admitting: Radiology

## 2021-01-27 DIAGNOSIS — Z0001 Encounter for general adult medical examination with abnormal findings: Secondary | ICD-10-CM | POA: Insufficient documentation

## 2021-01-27 DIAGNOSIS — Z79899 Other long term (current) drug therapy: Secondary | ICD-10-CM | POA: Diagnosis not present

## 2021-01-27 DIAGNOSIS — I10 Essential (primary) hypertension: Secondary | ICD-10-CM | POA: Diagnosis not present

## 2021-01-27 LAB — BASIC METABOLIC PANEL
Anion gap: 7 (ref 5–15)
BUN: 10 mg/dL (ref 6–20)
CO2: 25 mmol/L (ref 22–32)
Calcium: 9 mg/dL (ref 8.9–10.3)
Chloride: 107 mmol/L (ref 98–111)
Creatinine, Ser: 1.19 mg/dL (ref 0.61–1.24)
GFR, Estimated: >60 mL/min (ref >60)
Glucose, Bld: 123 mg/dL — ABNORMAL HIGH (ref 70–99)
Potassium: 4 mmol/L (ref 3.5–5.1)
Sodium: 139 mmol/L (ref 135–145)

## 2021-01-27 LAB — PROTIME-INR
INR: 1.2 (ref 0.8–1.2)
Prothrombin Time: 15.3 seconds — ABNORMAL HIGH (ref 11.4–15.2)

## 2021-01-27 NOTE — Progress Notes (Signed)
[facility] Male, 60 y.o., 11/16/1960  MRN:  [id number] Phone:  [phone] Jerilynn Mages)       PCP:  [name], MD Coverage:  [facility] Medicare/[employer] Medicare Choice Ppo  Next Appt With Radiology (AP-PET CT 1) 02/03/2021 at 7:00 PM           RE: US Liver Biopsy Received: Today [name], MD  Arlyn Leak to hold        Previous Messages   ----- Message -----  From: Garth Bigness D  Sent: 01/27/2021  9:49 AM EDT  To: [name], MD  Subject: FW: US Liver Biopsy                Good morning, patient is on Eliquis and will need to hold for 2 days prior to his biopsy. Sent message to Dr. Shanon Brow Tat and have no heard anything back yet. Just need to know if okay to hold, thanks.  ----- Message -----  From: Jillyn Hidden  Sent: 01/23/2021  9:51 AM EDT  To: Orson Eva, MD  Subject: FW: US Liver Biopsy                Good morning, Dr. Carles Collet  [name] has been approved to have a biopsy done but -patient is on Eliquis and will need to hold it for 2 days prior to his biopsy. Please advise if it is okay to hold. Thanks Aniceto Boss  ----- Message -----  From: Criselda Peaches, MD  Sent: 01/23/2021  8:05 AM EDT  To: Jillyn Hidden  Subject: RE: US Liver Biopsy                Approved for US guided bx of liver lesion. Hx of both colon and lung cancer.   HKM    ----- Message -----  From: Garth Bigness D  Sent: 01/22/2021 11:20 AM EDT  To: [facility] Procedure Requests  Subject: US Liver Biopsy                  Procedure: US Liver Biopsy   Reason: Squamous cell lung cancer, right, Malignant neoplasm of rectum, liver masses, hx of lung and colon cancer   History: CT in computer, NM PET scheduled for 02/03/21   Provider: [name]   Provider Contact: [phone]

## 2021-02-03 ENCOUNTER — Encounter (HOSPITAL_COMMUNITY)
Admission: RE | Admit: 2021-02-03 | Discharge: 2021-02-03 | Disposition: A | Discharge status: Home / Self Care | Payer: Medicare PPO | Source: Ambulatory Visit | Attending: Hematology | Admitting: Hematology

## 2021-02-03 ENCOUNTER — Other Ambulatory Visit: Payer: Self-pay

## 2021-02-03 DIAGNOSIS — C3491 Malignant neoplasm of unspecified part of right bronchus or lung: Secondary | ICD-10-CM | POA: Insufficient documentation

## 2021-02-03 DIAGNOSIS — C20 Malignant neoplasm of rectum: Secondary | ICD-10-CM | POA: Diagnosis not present

## 2021-02-03 DIAGNOSIS — Z0389 Encounter for observation for other suspected diseases and conditions ruled out: Secondary | ICD-10-CM | POA: Diagnosis not present

## 2021-02-03 MED ORDER — FLUDEOXYGLUCOSE F - 18 (FDG) INJECTION
8.6900 | Freq: Once | INTRAVENOUS | Status: AC | PRN
  Administered 2021-02-03: 8.69 via INTRAVENOUS

## 2021-02-05 ENCOUNTER — Other Ambulatory Visit: Payer: Self-pay | Admitting: Radiology

## 2021-02-06 ENCOUNTER — Ambulatory Visit (HOSPITAL_COMMUNITY)
Admission: RE | Admit: 2021-02-06 | Discharge: 2021-02-06 | Disposition: A | Discharge status: Home / Self Care | Payer: Medicare PPO | Source: Ambulatory Visit | Attending: Hematology | Admitting: Hematology

## 2021-02-06 ENCOUNTER — Other Ambulatory Visit: Payer: Self-pay

## 2021-02-06 ENCOUNTER — Encounter (HOSPITAL_COMMUNITY): Payer: Self-pay

## 2021-02-06 DIAGNOSIS — Z86718 Personal history of other venous thrombosis and embolism: Secondary | ICD-10-CM | POA: Diagnosis not present

## 2021-02-06 DIAGNOSIS — Z7901 Long term (current) use of anticoagulants: Secondary | ICD-10-CM | POA: Insufficient documentation

## 2021-02-06 DIAGNOSIS — I10 Essential (primary) hypertension: Secondary | ICD-10-CM | POA: Diagnosis not present

## 2021-02-06 DIAGNOSIS — I4891 Unspecified atrial fibrillation: Secondary | ICD-10-CM | POA: Insufficient documentation

## 2021-02-06 DIAGNOSIS — R160 Hepatomegaly, not elsewhere classified: Secondary | ICD-10-CM | POA: Insufficient documentation

## 2021-02-06 DIAGNOSIS — C3491 Malignant neoplasm of unspecified part of right bronchus or lung: Secondary | ICD-10-CM | POA: Insufficient documentation

## 2021-02-06 DIAGNOSIS — I2510 Atherosclerotic heart disease of native coronary artery without angina pectoris: Secondary | ICD-10-CM | POA: Insufficient documentation

## 2021-02-06 DIAGNOSIS — Z79899 Other long term (current) drug therapy: Secondary | ICD-10-CM | POA: Insufficient documentation

## 2021-02-06 DIAGNOSIS — K769 Liver disease, unspecified: Secondary | ICD-10-CM | POA: Diagnosis not present

## 2021-02-06 DIAGNOSIS — K219 Gastro-esophageal reflux disease without esophagitis: Secondary | ICD-10-CM | POA: Insufficient documentation

## 2021-02-06 DIAGNOSIS — F17210 Nicotine dependence, cigarettes, uncomplicated: Secondary | ICD-10-CM | POA: Insufficient documentation

## 2021-02-06 DIAGNOSIS — Z7982 Long term (current) use of aspirin: Secondary | ICD-10-CM | POA: Insufficient documentation

## 2021-02-06 DIAGNOSIS — C20 Malignant neoplasm of rectum: Secondary | ICD-10-CM | POA: Diagnosis not present

## 2021-02-06 LAB — CBC
HCT: 45.7 % (ref 39.0–52.0)
Hemoglobin: 15.1 g/dL (ref 13.0–17.0)
MCH: 33.6 pg (ref 26.0–34.0)
MCHC: 33 g/dL (ref 30.0–36.0)
MCV: 101.8 fL — ABNORMAL HIGH (ref 80.0–100.0)
Platelets: 129 10*3/uL — ABNORMAL LOW (ref 150–400)
RBC: 4.49 MIL/uL (ref 4.22–5.81)
RDW: 16.5 % — ABNORMAL HIGH (ref 11.5–15.5)
WBC: 6.7 10*3/uL (ref 4.0–10.5)
nRBC: 0 % (ref 0.0–0.2)

## 2021-02-06 LAB — PROTIME-INR
INR: 1.1 (ref 0.8–1.2)
Prothrombin Time: 14.3 seconds (ref 11.4–15.2)

## 2021-02-06 MED ORDER — FENTANYL CITRATE (PF) 100 MCG/2ML IJ SOLN
INTRAMUSCULAR | Status: AC | PRN
  Administered 2021-02-06: 25 ug via INTRAVENOUS
  Administered 2021-02-06: 50 ug via INTRAVENOUS

## 2021-02-06 MED ORDER — LIDOCAINE HCL (PF) 1 % IJ SOLN
INTRAMUSCULAR | Status: AC
  Filled 2021-02-06: qty 30

## 2021-02-06 MED ORDER — FENTANYL CITRATE (PF) 100 MCG/2ML IJ SOLN
INTRAMUSCULAR | Status: AC
  Filled 2021-02-06: qty 2

## 2021-02-06 MED ORDER — GELATIN ABSORBABLE 12-7 MM EX MISC
CUTANEOUS | Status: AC
  Filled 2021-02-06: qty 1

## 2021-02-06 MED ORDER — HEPARIN SOD (PORK) LOCK FLUSH 100 UNIT/ML IV SOLN
INTRAVENOUS | Status: AC
  Filled 2021-02-06: qty 5

## 2021-02-06 MED ORDER — MIDAZOLAM HCL 2 MG/2ML IJ SOLN
INTRAMUSCULAR | Status: AC | PRN
  Administered 2021-02-06: 1 mg via INTRAVENOUS

## 2021-02-06 MED ORDER — HEPARIN SOD (PORK) LOCK FLUSH 100 UNIT/ML IV SOLN
500.0000 [IU] | Freq: Once | INTRAVENOUS | Status: AC
  Administered 2021-02-06: 500 [IU] via INTRAVENOUS
  Filled 2021-02-06: qty 5

## 2021-02-06 MED ORDER — MIDAZOLAM HCL 2 MG/2ML IJ SOLN
INTRAMUSCULAR | Status: AC
  Filled 2021-02-06: qty 2

## 2021-02-06 MED ORDER — SODIUM CHLORIDE 0.9 % IV SOLN: INTRAVENOUS | Status: DC

## 2021-02-06 NOTE — H&P (Signed)
Chief Complaint: Patient was seen in consultation today for liver lesion biopsy.  Referring Physician(s): Firefighter  Supervising Physician: Mir, Biochemist, clinical  Patient Status: [facility] - Out-pt  History of Present Illness: Juan Wheeler is a 60 y.o. male with a past medical history significant for GERD, CAD, PAD, HTN, DVT, PE, a.fib on Eliquis, lung cancer s/p chemo/radiation (currently on immunotherapy) and colorectal cancer s/p colectomy and diverting ostomy who presents today for a liver lesion biopsy. [name] is followed regularly by Dr. Delton Coombes for stage IIIa right lung squamous cell carcinoma, in April of this year he reported worsening dyspnea after not taking his Eliquis as prescribed - he was sent for a CT/CTA chest which confirmed bilateral PE. Also noted on the CT were new multiple hepatic masses concerning for metastatic disease. He underwent PET scan on 02/03/21 which noted marked hypermetabolic activity within these previously noted liver lesions. He has been referred to IR for percutaneous biopsy to further direct care.  Past Medical History:  Diagnosis Date  . Chronic lower back pain    a. Followed by pain management at [facility].  . Colon cancer [facility])    rectal cancer  . Coronary artery disease    a. 03/2013: abnl nuc -> LHC s/p DES to LCx, residual moderate disease in LAD (med rx unless refractory angina). b. Not on BB due to bradycardia.  Marland Kitchen DVT (deep venous thrombosis) ([place]) ~ 2013  . Dysrhythmia    AFib  . GERD (gastroesophageal reflux disease)   . H/O necrotising fasciitis   . History of blood transfusion    "once; after throwing up alot of blood" (04/17/2013)  . Hypertension   . LV dysfunction    a. EF 45% in 03/2013.  Marland Kitchen PAD (peripheral artery disease) ([place])    a. Occlusion of the right internal iliac artery, with significant atherosclerosis in the left internal iliac which was not amenable to reconstruction per notes from [facility]  in place 05/30/2020  . Pulmonary nodules 09/30/2014  . Rectal cancer ([place])   . Tobacco abuse     Past Surgical History:  Procedure Laterality Date  . ABDOMINAL SURGERY  1990's   'for stomach ulcers" (04/17/2013)  . COLECTOMY  2012   "for rectal cancer" (04/17/2013)  . COLONOSCOPY  2013   Dr. Cheryll Cockayne: colorectal anastomosis with ulcer and inflammation, benign biopsy  . COLONOSCOPY WITH PROPOFOL N/A 09/07/2016   Procedure: COLONOSCOPY WITH PROPOFOL;  Surgeon: Daneil Dolin, MD;  [facility]: [facility];  Service: Endoscopy;  Laterality: N/A;  10:00 am Colonoscopy via rectum and ostomy  . COLOSTOMY TAKEDOWN  2013  . CORONARY ANGIOPLASTY WITH STENT PLACEMENT  04/17/2013   "?1" (04/17/2013)  . FEMORAL-POPLITEAL BYPASS GRAFT Left 07/02/2015   Procedure: BYPASS GRAFT LEFT COMMON FEMORAL ARTERY TO LEFT ABOVE KNEE POPLITEAL ARTERY - USING LEFT GREATER SAPPHENOUS VEIN;  Surgeon: Elam Dutch, MD;  Location: [place];  Service: Vascular;  Laterality: Left;  . INCISION AND DRAINAGE ABSCESS Left 06/05/2016   Procedure: INCISION AND DRAINAGE ABSCESS;  Surgeon: Aviva Signs, MD;  Location: [facility] ORS;  Service: General;  Laterality: Left;  . INCISION AND DRAINAGE PERIRECTAL ABSCESS Left 06/07/2016   Procedure: IRRIGATION AND DEBRIDEMENT LEFT BUTTOCK ABSCESS;  Surgeon: Greer Pickerel, MD;  Location: [place];  Service: General;  Laterality: Left;  . INGUINAL HERNIA REPAIR Bilateral 1990's  . LAPAROSCOPIC PARTIAL COLECTOMY N/A 06/11/2016   Procedure: LAPAROSCOPIC  OPEN COLOSTOMY;  Surgeon: [name], MD;  Location: [place];  Service: General;  Laterality: N/A;  . LEFT HEART CATHETERIZATION WITH CORONARY ANGIOGRAM N/A 04/17/2013   Procedure: LEFT HEART CATHETERIZATION WITH CORONARY ANGIOGRAM;  Surgeon: Peter M Martinique, MD;  Location: [facility] CATH LAB;  Service: Cardiovascular;  Laterality: N/A;  . PERCUTANEOUS STENT INTERVENTION  04/17/2013   Procedure: PERCUTANEOUS STENT INTERVENTION;  Surgeon: Peter M Martinique, MD;   Location: [facility] CATH LAB;  Service: Cardiovascular;;  . PERIPHERAL VASCULAR CATHETERIZATION N/A 06/14/2015   Procedure: Abdominal Aortogram;  Surgeon: Elam Dutch, MD;  Location: [place] CV LAB;  Service: Cardiovascular;  Laterality: N/A;  . PERIPHERAL VASCULAR CATHETERIZATION Bilateral 06/14/2015   Procedure: Lower Extremity Angiography;  Surgeon: Elam Dutch, MD;  Location: [place] CV LAB;  Service: Cardiovascular;  Laterality: Bilateral;  . PORTACATH PLACEMENT Left 06/05/2020   Procedure: INSERTION PORT-A-CATH;  Surgeon: Aviva Signs, MD;  Location: [facility];  Service: General;  Laterality: Left;  Marland Kitchen VEIN HARVEST Left 07/02/2015   Procedure: VEIN HARVEST - LEFT GREATER SAPPHENOUS VEIN;  Surgeon: Elam Dutch, MD;  Location: [place];  Service: Vascular;  Laterality: Left;    Allergies: Tramadol and Darvocet [propoxyphene n-acetaminophen]  Medications: Prior to Admission medications   Medication Sig Start Date End Date Taking? Authorizing Provider  apixaban (ELIQUIS) 5 MG TABS tablet Take 1 tablet (5 mg total) by mouth 2 (two) times daily. 01/18/21  Yes TatShanon Brow, MD  aspirin EC 81 MG tablet Take 81 mg by mouth daily.   Yes [provider]  HYDROcodone-acetaminophen (NORCO) 10-325 MG tablet Take 1 tablet by mouth every 4 (four) hours as needed. Patient taking differently: Take 1 tablet by mouth every 4 (four) hours as needed for severe pain. 01/20/21  Yes [name], MD  isosorbide mononitrate (IMDUR) 30 MG 24 hr tablet TAKE 1/2 TABLET BY MOUTH ONCE DAILY. Patient taking differently: Take 15 mg by mouth daily. 12/04/15  Yes Herminio Commons, MD  losartan (COZAAR) 25 MG tablet Take 0.5 tablets (12.5 mg total) by mouth daily. 01/18/21  Yes Tat, Shanon Brow, MD  metoprolol succinate (TOPROL-XL) 25 MG 24 hr tablet Take 1 tablet (25 mg total) by mouth daily. 01/18/21  Yes Tat, Shanon Brow, MD  NITROSTAT 0.4 MG SL tablet PLACE 1 TABLET UNDER THE TONGUE AS NEEDED FOR CHEST PAIN UP  TO 3 DOSES Patient taking differently: Place 0.4 mg under the tongue every 5 (five) minutes as needed for chest pain. 10/06/16  Yes Herminio Commons, MD  omeprazole (PRILOSEC) 40 MG capsule Take 40 mg by mouth daily. 07/20/16  Yes [provider]  DURVALUMAB IV Inject into the vein every 14 (fourteen) days. 09/12/20   [provider]  prochlorperazine (COMPAZINE) 10 MG tablet Take 1 tablet (10 mg total) by mouth every 6 (six) hours as needed (Nausea or vomiting). 06/26/20 08/16/20  [name], MD     Family History  Problem Relation Age of Onset  . Cancer Mother   . Hypertension Mother   . Bleeding Disorder Brother     Social History   Socioeconomic History  . Marital status: Married    Spouse name: Not on file  . Number of children: Not on file  . Years of education: Not on file  . Highest education level: Not on file  Occupational History  . Not on file  Tobacco Use  . Smoking status: Light Tobacco Smoker    Packs/day: 0.25    Years: 40.00    Pack years: 10.00  Types: Cigarettes    Start date: 03/14/1974  . Smokeless tobacco: Never Used  . Tobacco comment: 5-6 per day 06/12/15  Vaping Use  . Vaping Use: Never used  Substance and Sexual Activity  . Alcohol use: Yes    Alcohol/week: 3.0 standard drinks    Types: 3 Cans of beer per week  . Drug use: Yes    Types: Marijuana    Comment: 2 days ago   . Sexual activity: Yes    Partners: Male    Birth control/protection: None  Other Topics Concern  . Not on file  Social History Narrative  . Not on file   Social Determinants of Health   Financial Resource Strain: Low Risk   . Difficulty of Paying Living Expenses: Not hard at all  Food Insecurity: No Food Insecurity  . Worried About Charity fundraiser in the Last Year: Never true  . Ran Out of Food in the Last Year: Never true  Transportation Needs: No Transportation Needs  . Lack of Transportation (Medical): No  . Lack of  Transportation (Non-Medical): No  Physical Activity: Inactive  . Days of Exercise per Week: 0 days  . Minutes of Exercise per Session: 0 min  Stress: Stress Concern Present  . Feeling of Stress : To some extent  Social Connections: Moderately Integrated  . Frequency of Communication with Friends and Family: Three times a week  . Frequency of Social Gatherings with Friends and Family: Three times a week  . Attends Religious Services: 1 to 4 times per year  . Active Member of Clubs or Organizations: No  . Attends Archivist Meetings: Never  . Marital Status: Married     Review of Systems: A 12 point ROS discussed and pertinent positives are indicated in the HPI above.  All other systems are negative.  Review of Systems  Constitutional: Negative for chills and fever.  Respiratory: Negative for cough and shortness of breath.   Cardiovascular: Negative for chest pain.  Gastrointestinal: Negative for abdominal pain, nausea and vomiting.  Musculoskeletal: Positive for back pain (chronic; lower back).  Skin: Negative for color change.  Neurological: Negative for dizziness and headaches.    Vital Signs: BP (!) 107/93   Pulse (!) 55   Temp 98.3 F (36.8 C) (Oral)   Resp 18   Ht 5\' 9"  (1.753 m)   Wt 152 lb (68.9 kg)   SpO2 100%   BMI 22.45 kg/m   Physical Exam Vitals reviewed.  Constitutional:      General: He is not in acute distress. HENT:     Head: Normocephalic.     Mouth/Throat:     Mouth: Mucous membranes are moist.     Pharynx: Oropharynx is clear. No oropharyngeal exudate or posterior oropharyngeal erythema.  Eyes:     General: No scleral icterus. Cardiovascular:     Rate and Rhythm: Normal rate and regular rhythm.  Pulmonary:     Effort: Pulmonary effort is normal.     Breath sounds: Normal breath sounds.  Abdominal:     General: There is no distension.     Palpations: Abdomen is soft.     Tenderness: There is no abdominal tenderness.  Skin:     General: Skin is warm and dry.     Coloration: Skin is not jaundiced.  Neurological:     Mental Status: He is alert and oriented to person, place, and time.  Psychiatric:        Mood and  Affect: Mood normal.        Behavior: Behavior normal.        Thought Content: Thought content normal.        Judgment: Judgment normal.      MD Evaluation Airway: WNL Heart: WNL Abdomen: WNL Chest/ Lungs: WNL ASA  Classification: 3 Mallampati/Airway Score: Two   Imaging: CT Chest W Contrast  Result Date: 01/15/2021 CLINICAL DATA:  Non-small cell lung cancer diagnosed in 2021. Chemotherapy completed. Immunotherapy ongoing. EXAM: CT CHEST WITH CONTRAST TECHNIQUE: Multidetector CT imaging of the chest was performed during intravenous contrast administration. CONTRAST:  20mL OMNIPAQUE IOHEXOL 300 MG/ML  SOLN COMPARISON:  CT chest 11/18/2020 and 09/09/2020.  PET-CT 04/29/2020. FINDINGS: Cardiovascular: Although not performed as a pulmonary CTA, there is new ill-defined decreased enhancement within the segmental and subsegmental branches of the left lower lobe pulmonary artery (image 104/2), suspicious for pulmonary embolism, possibly subacute. Left subclavian Port-A-Cath extends to the mid SVC. There is atherosclerosis of the aorta, great vessels and coronary arteries. The heart size is normal. There is no pericardial effusion. Mediastinum/Nodes: There are no enlarged mediastinal, hilar or axillary lymph nodes.Small pretracheal and subcarinal lymph nodes are unchanged. The thyroid gland, trachea and esophagus demonstrate no significant findings. Lungs/Pleura: Stable small right pleural effusion and adjacent pleural thickening. Again demonstrated is moderate to severe centrilobular and paraseptal emphysema with mild diffuse central airway thickening. Progressive subpleural consolidation posteriorly in the right lower lobe, largely obscuring the previously demonstrated mass on the lung windows. On the soft tissue  windows, there is a thick walled nodular density in this area with probable peripheral enhancement which appears slightly larger, measuring 3.5 x 2.4 cm on image 101/2. Multiple additional pulmonary nodules bilaterally are unchanged, largest measuring 6 mm in the left lower lobe on image 122/4. No other enlarging nodules identified. Upper abdomen: Multiple new peripherally enhancing hepatic lesions highly suspicious for metastatic disease. Largest lesion is in the left hepatic lobe, measuring 2.5 x 2.5 cm on image 168/2. There are multiple smaller lesions, measuring 9 mm on image 150/2, 11 mm on image 157/2 and 15 mm on image 160/2. There is stable nodularity of the adrenal glands. Musculoskeletal/Chest wall: There is no chest wall mass or suspicious osseous finding. IMPRESSION: 1. Multiple new peripherally enhancing liver lesions highly suspicious for hepatic metastatic disease. 2. Enlarging, peripherally enhancing pulmonary lesion posteriorly in the right lower lobe, largely obscured by surrounding consolidation, but suspicious for local recurrence of lung cancer, especially in light of the abdominal findings. Follow-up PET-CT may be helpful for further staging. 3. Additional pulmonary nodules are unchanged. 4. Suspected new ill-defined filling defect within the segmental and subsegmental branches of the left lower lobe pulmonary artery, suspicious for pulmonary embolism, possibly subacute. 5. Coronary and Aortic Atherosclerosis (ICD10-I70.0). Emphysema (ICD10-J43.9). 6. Critical Value/emergent results were called by telephone at the time of interpretation on 01/15/2021 at 3:20 pm to provider [facility] , who verbally acknowledged these results. Electronically Signed   By: Richardean Sale M.D.   On: 01/15/2021 15:26   CT Angio Chest PE W and/or Wo Contrast  Result Date: 01/15/2021 CLINICAL DATA:  60 year old male with concern for pulmonary embolism. History of lung cancer. EXAM: CT ANGIOGRAPHY CHEST  WITH CONTRAST TECHNIQUE: Multidetector CT imaging of the chest was performed using the standard protocol during bolus administration of intravenous contrast. Multiplanar CT image reconstructions and MIPs were obtained to evaluate the vascular anatomy. CONTRAST:  4mL OMNIPAQUE IOHEXOL 350 MG/ML SOLN COMPARISON:  Chest CT dated  01/15/2021. FINDINGS: Cardiovascular: Mildly dilated right atrium with retrograde flow of contrast from the right atrium into the IVC suggestive of a degree of right heart dysfunction. No pericardial effusion. There is coronary vascular calcification. Mild atherosclerotic calcification of the thoracic aorta. Left lower lobe segmental pulmonary artery emboli as seen on the earlier CT. Additional linear and nonocclusive emboli in the segmental and subsegmental branches of the right lower lobe. No CT evidence of right heart dysfunction. Mediastinum/Nodes: Right hilar adenopathy as well as mildly enlarged subcarinal lymph node as seen on the earlier CT. The esophagus and the thyroid gland are grossly unremarkable. No mediastinal fluid collection. Lungs/Pleura: Background of severe emphysema. Small right pleural effusion with associated subsegmental compressive atelectasis of the right lower lobe. Focal area of subpleural consolidation in the right lower lobe (256/7) as seen on the earlier CT. Follow-up as per recommendation of earlier CT. There is a 6 mm nodule along the minor fissure (111/6). No pneumothorax. The central airways are patent. Upper Abdomen: Multiple hepatic hypodense lesions most consistent with metastatic disease. Postsurgical changes of bowel with right upper quadrant ostomy. Musculoskeletal: Degenerative changes of the spine. No acute osseous pathology. Left-sided Port-A-Cath with tip over central SVC. Review of the MIP images confirms the above findings. IMPRESSION: 1. Bilateral lower lobe segmental and subsegmental pulmonary artery emboli. No CT evidence of right heart  dysfunction. 2. Small right pleural effusion with associated subsegmental compressive atelectasis of the right lower lobe. 3. Focal area of subpleural consolidation in the right lower lobe as seen on the earlier CT. Findings may be infectious in etiology or represent recurrence of malignancy. Follow-up as per recommendation of earlier chest CT. 4. Right hilar and subcarinal adenopathy as seen on the earlier CT. 5. Multiple hepatic metastatic disease. 6. Aortic Atherosclerosis (ICD10-I70.0) and Emphysema (ICD10-J43.9). These results were called by telephone at the time of interpretation on 01/15/2021 at 7:46 pm to provider [facility] , who verbally acknowledged these results. Electronically Signed   By: Anner Crete M.D.   On: 01/15/2021 19:51   NM PET Image Restag (PS) Skull Base To Thigh  Result Date: 02/04/2021 CLINICAL DATA:  Subsequent treatment strategy for colorectal cancer in a 60 year old male, assess treatment response. EXAM: NUCLEAR MEDICINE PET SKULL BASE TO THIGH TECHNIQUE: 8.69 mCi F-18 FDG was injected intravenously. Full-ring PET imaging was performed from the skull base to thigh after the radiotracer. CT data was obtained and used for attenuation correction and anatomic localization. Fasting blood glucose: 103 mg/dl COMPARISON:  April of 2022 and prior PET imaging from August of 2021. FINDINGS: Mediastinal blood pool activity: SUV max 1.84 Liver activity: SUV max NA NECK: No hypermetabolic lymph nodes in the neck. Incidental CT findings: none CHEST: At the site of newly discovered nodularity amidst post treatment changes is evidence of increased metabolic activity. Maximum SUV in this location of 13.1. Area measuring approximately 3.1 x 2.3 cm. Adjacent pleural thickening similar to recent chest CT with small effusion dependently in the chest. (Image 102/3) subcentimeter lymph node in the anterior mediastinum, 8 mm short axis with a maximum SUV of 3.7 Mildly enlarged subcarinal nodal tissue  (image 131/3) maximum SUV of 4.3 Mild increased metabolic activity about the RIGHT hilum in a subcentimeter lymph node on image 131 of series 3 with a maximum SUV of 4. No additional foci of increased metabolic activity in the chest. Incidental CT findings: Marked pulmonary emphysema. Signs of ground-glass and septal thickening extending from the area of the RIGHT lower lobe  in the setting of prior post treatment changes. Small nodule at the LEFT lung base not well evaluated on the current study (image 160/3 not displaying marked hypermetabolic activity and unchanged since 2019. Unchanged juxtapleural nodule in the RIGHT mid chest and small RIGHT upper lobe nodules. Calcified coronary artery disease, three-vessel disease. No substantial pericardial effusion. Calcified aortic atherosclerosis. Normal caliber of the central pulmonary vessels. LEFT-sided Port-A-Cath terminates at the distal aspect of the superior vena cava. ABDOMEN/PELVIS: Multifocal hepatic metastatic disease with marked hypermetabolic activity (image 270/6) maximum SUV of 12.8 in an area of the LEFT hepatic lobe measuring approximately 4.1 x 3.2 cm. This may have enlarged since the prior study but early phase of enhancement on the prior study limits assessment. Numerous additional foci of hepatic metastatic disease throughout the liver including the RIGHT hepatic lobe, for instance in hepatic subsegment VII/VIII on image 183 of series 3 is an area displaying a maximum SUV of 9 and measuring approximately 2 cm, previously this area measured approximately 1.8 cm, again comparison with respect to size difficult. At least 12 additional lesions in the liver displaying similar metabolic activity. Celiac lymph node on image 203 of series 3 measuring 8 mm short axis with a maximum SUV of 9.3. Intense metabolic activity in the presacral region tracking towards the LEFT gluteal fold similar to the prior study, gas in this tract and C time and place. This is  adjacent to a low rectal anastomosis. Post RIGHT colectomy as before. No adenopathy in the pelvis. Incidental CT findings: Postoperative changes in the abdomen as described. No acute pancreatic process. Spleen is normal size. Mild thickening of the adrenal glands is unchanged without hypermetabolic features. The smooth contour the bilateral kidneys. No hydronephrosis. Mild perinephric stranding is similar to prior studies. Signs of RIGHT colectomy and diverting ostomy in the RIGHT hemiabdomen. Post treatment changes in the pelvis adjacent to anastomotic site with fistulous tract extending into the LEFT gluteal fold. See time and place. No discrete fluid collection aside from gas within the presacral region and along the presumed fistulous tract in the area of the sphincter complex crossing pelvic floor into the LEFT gluteal fold. Aortic atherosclerosis without aneurysmal dilation. SKELETON: No focal hypermetabolic activity to suggest skeletal metastasis. Incidental CT findings: Irregularity of the anterior sacrum in the setting of fistula in this location may reflect chronic osteomyelitis. Signs of avascular necrosis of LEFT femoral head. IMPRESSION: Signs of recurrent disease at the site of treated lung cancer. Mildly hypermetabolic lymph nodes in the chest are suspicious in light of other findings on the current study for nodal disease. Signs of hepatic metastatic disease either from lung primary or colonic primary. Celiac nodal disease also noted in the upper abdomen. Signs of fistulous tract in the LEFT gluteal region extending from the upper margin of the sphincter complex remaining associated with increased metabolic activity and with small amounts of gas along the tract. Gas may have increased slightly since more remote imaging. There is no drainable collection. Correlate with any worsening pelvic symptoms. Irregularity of the anterior sacrum may reflect chronic osteomyelitis. Marked changes of avascular  necrosis of LEFT femoral head. Aortic atherosclerosis. Pulmonary emphysema. Aortic Atherosclerosis (ICD10-I70.0) and Emphysema (ICD10-J43.9). Electronically Signed   By: Zetta Bills M.D.   On: 02/04/2021 14:44   ECHOCARDIOGRAM COMPLETE  Result Date: 01/17/2021    ECHOCARDIOGRAM REPORT   Patient Name:   Juan MOUNGER Date of Exam: 01/17/2021 Medical Rec #:  [id number]     Height:  69.0 in Accession #:    [id number]    Weight:       148.8 lb Date of Birth:  01/25/1961     BSA:          1.822 m Patient Age:    37 years      BP:           106/65 mmHg Patient Gender: M             HR:           74 bpm. Exam Location:  [facility] Procedure: 2D Echo Indications:    Pulmonary Embolus I26.09  History:        Patient has prior history of Echocardiogram examinations, most                 recent 11/16/2017. CAD, Signs/Symptoms:Chest Pain; Risk                 Factors:Hypertension and Current Smoker. Pulmonary Embolism,                 Elevated Troponin.  Sonographer:    Leavy Cella RDCS (AE) Referring Phys: [id number] OLADAPO ADEFESO IMPRESSIONS  1. Left ventricular ejection fraction, by estimation, is 30 to 35%. The left ventricle has moderately decreased function. The left ventricle demonstrates global hypokinesis with some regional variation. Left ventricular diastolic parameters are indeterminate in the setting of atrial fibrillation.  2. Right ventricular systolic function is moderately reduced. The right ventricular size is normal. Tricuspid regurgitation signal is inadequate for assessing PA pressure.  3. Left atrial size was mildly dilated.  4. The mitral valve is grossly normal. Mild mitral valve regurgitation.  5. The aortic valve was not well visualized. There is mild calcification of the aortic valve. Aortic valve regurgitation is not visualized.  6. The inferior vena cava is normal in size with greater than 50% respiratory variability, suggesting right atrial pressure of 3 mmHg. FINDINGS  Left Ventricle:  Left ventricular ejection fraction, by estimation, is 30 to 35%. The left ventricle has moderately decreased function. The left ventricle demonstrates global hypokinesis. The left ventricular internal cavity size was normal in size. There is no left ventricular hypertrophy. Left ventricular diastolic parameters are indeterminate. Right Ventricle: The right ventricular size is normal. No increase in right ventricular wall thickness. Right ventricular systolic function is moderately reduced. Tricuspid regurgitation signal is inadequate for assessing PA pressure. Left Atrium: Left atrial size was mildly dilated. Right Atrium: Right atrial size was normal in size. Pericardium: There is no evidence of pericardial effusion. Mitral Valve: The mitral valve is grossly normal. Mild mitral valve regurgitation. Tricuspid Valve: The tricuspid valve is grossly normal. Tricuspid valve regurgitation is trivial. Aortic Valve: The aortic valve was not well visualized. There is mild calcification of the aortic valve. There is mild to moderate aortic valve annular calcification. Aortic valve regurgitation is not visualized. Pulmonic Valve: The pulmonic valve was not well visualized. Pulmonic valve regurgitation is not visualized. Aorta: The aortic root is normal in size and structure. Venous: The inferior vena cava is normal in size with greater than 50% respiratory variability, suggesting right atrial pressure of 3 mmHg. IAS/Shunts: No atrial level shunt detected by color flow Doppler.  LEFT VENTRICLE PLAX 2D LVIDd:         4.77 cm  Diastology LVIDs:         4.01 cm  LV e' medial:    9.25 cm/s LV PW:  1.06 cm  LV E/e' medial:  7.4 LV IVS:        1.08 cm  LV e' lateral:   16.20 cm/s LVOT diam:     1.90 cm  LV E/e' lateral: 4.3 LVOT Area:     2.84 cm  RIGHT VENTRICLE RV S prime:     9.14 cm/s TAPSE (M-mode): 1.6 cm LEFT ATRIUM           Index       RIGHT ATRIUM           Index LA diam:      3.60 cm 1.98 cm/m  RA Area:     19.80  cm LA Vol (A2C): 68.6 ml 37.65 ml/m RA Volume:   57.50 ml  31.56 ml/m LA Vol (A4C): 64.9 ml 35.62 ml/m   AORTA Ao Root diam: 2.70 cm MITRAL VALVE MV Area (PHT): 2.77 cm    SHUNTS MV Decel Time: 274 msec    Systemic Diam: 1.90 cm MV E velocity: 68.90 cm/s Rozann Lesches MD Electronically signed by Rozann Lesches MD Signature Date/Time: 01/17/2021/12:17:50 PM    Final     Labs:  CBC: Recent Labs    01/16/21 0238 01/17/21 0454 01/18/21 0355 01/22/21 0925  WBC 5.8 4.6 4.5 5.9  HGB 15.6 15.8 15.6 15.4  HCT 47.0 47.3 46.7 46.7  PLT 145* 149* 144* 163    COAGS: Recent Labs    05/08/20 0855 01/16/21 0238 01/27/21 1000  INR 1.0 1.1 1.2  APTT  --  56*  --     BMP: Recent Labs    03/05/20 1308 05/22/20 1528 06/13/20 0844 06/27/20 0927 07/04/20 0939 01/17/21 0454 01/18/21 0355 01/22/21 0925 01/27/21 1000  NA 136 138 137 137   < > 135 135 138 139  K 4.3 4.2 3.9 4.1   < > 4.2 4.6 4.2 4.0  CL 103 104 104 107   < > 104 102 104 107  CO2 20* 25 25 23    < > 23 25 25 25   GLUCOSE 204* 97 102* 98   < > 90 96 100* 123*  BUN 17 9 11 12    < > 12 12 13 10   CALCIUM 9.4 8.9 9.1 9.2   < > 8.6* 8.7* 8.6* 9.0  CREATININE 1.67* 1.22 1.17 1.27*   < > 1.15 1.16 1.14 1.19  GFRNONAA 44* >60 >60 >60   < > >60 >60 >60 >60  GFRAA 51* >60 >60 >60  --   --   --   --   --    < > = values in this interval not displayed.    LIVER FUNCTION TESTS: Recent Labs    01/15/21 1820 01/16/21 0238 01/18/21 0355 01/22/21 0925  BILITOT 1.0 1.2 0.6 1.4*  AST 20 19 18 24   ALT 18 17 16 24   ALKPHOS 68 61 64 76  PROT 7.2 6.6 6.8 7.2  ALBUMIN 3.1* 2.9* 3.1* 3.3*    TUMOR MARKERS: No results for input(s): AFPTM, CEA, CA199, CHROMGRNA in the last 8760 hours.  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 Delton Coombes who presents today for a liver lesion  biopsy due to recently noted new hypermetabolic liver lesions concerning for metastatic disease.  Patient has been NPO since midnight, last dose of Eliquis Monday, last dose of ASA Monday. Afebrile, pre-procedure  labs pending.  Risks and benefits of liver lesion biopsy was discussed with the patient and/or patient's family including, but not limited to bleeding, infection, damage to adjacent structures or low yield requiring additional tests.  All of the questions were answered and there is agreement to proceed.  Consent signed and in chart.  Thank you for this interesting consult.  I greatly enjoyed meeting SACHIT GILMAN and look forward to participating in their care.  A copy of this report was sent to the requesting provider on this date.  Electronically Signed: Joaquim Nam, [name] 02/06/2021, 10:59 AM   I spent a total of 30 Minutes in face to face in clinical consultation, greater than 50% of which was counseling/coordinating care for liver lesion biopsy.

## 2021-02-06 NOTE — Procedures (Signed)
Interventional Radiology Procedure Note  Procedure: Right liver mass biopsy  Indication: Multiple liver masses  Findings: Please refer to procedural dictation for full description.  Complications: None  EBL: < 10 mL  Miachel Roux, MD [phone]

## 2021-02-06 NOTE — Discharge Instructions (Signed)
Liver Biopsy, Care After These instructions give you information on caring for yourself after your procedure. Your doctor may also give you more specific instructions. Call your doctor if you have any problems or questions after your procedure. What can I expect after the procedure? After the procedure, it is common to have:  Pain and soreness where the biopsy was done.  Bruising around the area where the biopsy was done.  Sleepiness and be tired for a few days. Follow these instructions at home: Medicines  Take over-the-counter and prescription medicines only as told by your doctor.  If you were prescribed an antibiotic medicine, take it as told by your doctor. Do not stop taking the antibiotic even if you start to feel better.  Do not take medicines such as aspirin and ibuprofen. These medicines can thin your blood. Do not take these medicines unless your doctor tells you to take them.  If you are taking prescription pain medicine, take actions to prevent or treat constipation. Your doctor may recommend that you: ? Drink enough fluid to keep your pee (urine) clear or pale yellow. ? Take over-the-counter or prescription medicines. ? Eat foods that are high in fiber, such as fresh fruits and vegetables, whole grains, and beans. ? Limit foods that are high in fat and processed sugars, such as fried and sweet foods. Caring for your cut  Follow instructions from your doctor about how to take care of your cuts from surgery (incisions). Make sure you: ? Wash your hands with soap and water before you change your bandage (dressing). If you cannot use soap and water, use hand sanitizer. ? Change your bandage as told by your doctor. ? Leave stitches (sutures), skin glue, or skin tape (adhesive) strips in place. They may need to stay in place for 2 weeks or longer. If tape strips get loose and curl up, you may trim the loose edges. Do not remove tape strips completely unless your doctor says it is  okay.  Check your cuts every day for signs of infection. Check for: ? Redness, swelling, or more pain. ? Fluid or blood. ? Pus or a bad smell. ? Warmth.  Do not take baths, swim, or use a hot tub until your doctor says it is okay to do so. Activity  Rest at home for 1-2 days or as told by your doctor. ? Avoid sitting for a long time without moving. Get up to take short walks every 1-2 hours.  Return to your normal activities as told by your doctor. Ask what activities are safe for you.  Do not do these things in the first 24 hours: ? Drive. ? Use machinery. ? Take a bath or shower.  Do not lift more than 10 pounds (4.5 kg) or play contact sports for the first 2 weeks.   General instructions  Do not drink alcohol in the first week after the procedure.  Have someone stay with you for at least 24 hours after the procedure.  Get your test results. Ask your doctor or the department that is doing the test: ? When will my results be ready? ? How will I get my results? ? What are my treatment options? ? What other tests do I need? ? What are my next steps?  Keep all follow-up visits as told by your doctor. This is important.   Contact a doctor if:  A cut bleeds and leaves more than just a small spot of blood.  A cut is red,   puffs up (swells), or hurts more than before.  Fluid or something else comes from a cut.  A cut smells bad.  You have a fever or chills. Get help right away if:  You have swelling, bloating, or pain in your belly (abdomen).  You get dizzy or faint.  You have a rash.  You feel sick to your stomach (nauseous) or throw up (vomit).  You have trouble breathing, feel short of breath, or feel faint.  Your chest hurts.  You have problems talking or seeing.  You have trouble with your balance or moving your arms or legs. Summary  After the procedure, it is common to have pain, soreness, bruising, and tiredness.  Your doctor will tell you how to  take care of yourself at home. Change your bandage, take your medicines, and limit your activities as told by your doctor.  Call your doctor if you have symptoms of infection. Get help right away if your belly swells, your cut bleeds a lot, or you have trouble talking or breathing. This information is not intended to replace advice given to you by your health care provider. Make sure you discuss any questions you have with your health care provider. Document Revised: 09/23/2017 Document Reviewed: 09/24/2017 Elsevier Patient Education  2021 [employer]  

## 2021-02-10 ENCOUNTER — Inpatient Hospital Stay (HOSPITAL_COMMUNITY): Payer: Medicare PPO | Attending: Hematology | Admitting: Hematology

## 2021-02-10 ENCOUNTER — Other Ambulatory Visit: Payer: Self-pay

## 2021-02-10 VITALS — BP 132/81 | HR 116 | Temp 97.7°F | Resp 19 | Wt 147.4 lb

## 2021-02-10 DIAGNOSIS — C3491 Malignant neoplasm of unspecified part of right bronchus or lung: Secondary | ICD-10-CM

## 2021-02-10 DIAGNOSIS — F17210 Nicotine dependence, cigarettes, uncomplicated: Secondary | ICD-10-CM | POA: Diagnosis not present

## 2021-02-10 DIAGNOSIS — G629 Polyneuropathy, unspecified: Secondary | ICD-10-CM | POA: Insufficient documentation

## 2021-02-10 DIAGNOSIS — R634 Abnormal weight loss: Secondary | ICD-10-CM | POA: Insufficient documentation

## 2021-02-10 DIAGNOSIS — Z85048 Personal history of other malignant neoplasm of rectum, rectosigmoid junction, and anus: Secondary | ICD-10-CM | POA: Diagnosis not present

## 2021-02-10 DIAGNOSIS — N189 Chronic kidney disease, unspecified: Secondary | ICD-10-CM | POA: Diagnosis not present

## 2021-02-10 DIAGNOSIS — C787 Secondary malignant neoplasm of liver and intrahepatic bile duct: Secondary | ICD-10-CM | POA: Insufficient documentation

## 2021-02-10 DIAGNOSIS — C3431 Malignant neoplasm of lower lobe, right bronchus or lung: Secondary | ICD-10-CM | POA: Diagnosis not present

## 2021-02-10 DIAGNOSIS — C20 Malignant neoplasm of rectum: Secondary | ICD-10-CM | POA: Diagnosis not present

## 2021-02-10 DIAGNOSIS — I429 Cardiomyopathy, unspecified: Secondary | ICD-10-CM | POA: Insufficient documentation

## 2021-02-10 LAB — SURGICAL PATHOLOGY

## 2021-02-10 MED ORDER — OXYCODONE-ACETAMINOPHEN 10-325 MG PO TABS: 1.0000 | ORAL_TABLET | Freq: Four times a day (QID) | ORAL | 0 refills | Status: DC | PRN

## 2021-02-10 NOTE — Patient Instructions (Addendum)
[place] [facility] Discharge Instructions  You were seen today by Dr. Delton Coombes. He went over your recent results. Drink 1 can of Boost/Ensure daily to increase your daily calorie intake; this is in addition to your typical meals. Dr. Delton Coombes will see you back in 1 week for labs and follow up.   Thank you for choosing [facility] at [facility] to provide your oncology and hematology care.  To afford each patient quality time with our provider, please arrive at least 15 minutes before your scheduled appointment time.   If you have a lab appointment with the [facility] please come in thru the Main Entrance and check in at the main information desk  You need to re-schedule your appointment should you arrive 10 or more minutes late.  We strive to give you quality time with our providers, and arriving late affects you and other patients whose appointments are after yours.  Also, if you no show three or more times for appointments you may be dismissed from the clinic at the providers discretion.     Again, thank you for choosing [facility].  Our hope is that these requests will decrease the amount of time that you wait before being seen by our physicians.       _____________________________________________________________  Should you have questions after your visit to [facility], please contact our office at [phone] between the hours of 8:00 a.m. and 4:30 p.m.  Voicemails left after 4:00 p.m. will not be returned until the following business day.  For prescription refill requests, have your pharmacy contact our office and allow 72 hours.    Cancer Center Support Programs:   > Cancer Support Group  2nd Tuesday of the month 1pm-2pm, Journey Room

## 2021-02-10 NOTE — Progress Notes (Signed)
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 activity in the presacral region tracking towards the LEFT gluteal fold similar to the prior study, gas in this tract and C time and place. This is adjacent to a low rectal anastomosis. Post RIGHT colectomy as before. No adenopathy in the pelvis. Incidental CT findings: Postoperative changes in the abdomen as described. No acute pancreatic process. Spleen is normal size. Mild thickening of the adrenal glands is unchanged without hypermetabolic features. The smooth contour the bilateral kidneys. No hydronephrosis. Mild perinephric stranding is similar to prior studies. Signs of RIGHT colectomy and diverting ostomy in the RIGHT hemiabdomen. Post treatment changes in the pelvis adjacent to anastomotic site with fistulous tract extending into the LEFT gluteal fold. See time and place. No discrete fluid collection aside from gas within  the presacral region and along the presumed fistulous tract in the area of the sphincter complex crossing pelvic floor into the LEFT gluteal fold. Aortic atherosclerosis without aneurysmal dilation. SKELETON: No focal hypermetabolic  activity to suggest skeletal metastasis. Incidental CT findings: Irregularity of the anterior sacrum in the setting of fistula in this location may reflect chronic osteomyelitis. Signs of avascular necrosis of LEFT femoral head. IMPRESSION: Signs of recurrent disease at the site of treated lung cancer. Mildly hypermetabolic lymph nodes in the chest are suspicious in light of other findings on the current study for nodal disease. Signs of hepatic metastatic disease either from lung primary or colonic primary. Celiac nodal disease also noted in the upper abdomen. Signs of fistulous tract in the LEFT gluteal region extending from the upper margin of the sphincter complex remaining associated with increased metabolic activity and with small amounts of gas along the tract. Gas may have increased slightly since more remote imaging. There is no drainable collection. Correlate with any worsening pelvic symptoms. Irregularity of the anterior sacrum may reflect chronic osteomyelitis. Marked changes of avascular necrosis of LEFT femoral head. Aortic atherosclerosis. Pulmonary emphysema. Aortic Atherosclerosis (ICD10-I70.0) and Emphysema (ICD10-J43.9). Electronically Signed   By: Zetta Bills M.D.   On: 02/04/2021 14:44   US BIOPSY (LIVER)  Result Date: 02/06/2021 INDICATION: 60 year old gentleman with history of colorectal malignancy presents today measure radiology for biopsy of liver lesion. EXAM: Ultrasound-guided biopsy of liver mass MEDICATIONS: None. ANESTHESIA/SEDATION: Moderate (conscious) sedation was employed during this procedure. A total of Versed 1 mg and Fentanyl 75 mcg was administered intravenously. Moderate Sedation Time: 10 minutes. The patient's level of consciousness and vital signs were monitored continuously by radiology nursing throughout the procedure under my direct supervision. COMPLICATIONS: None immediate. PROCEDURE: Informed written consent was obtained from the patient after a thorough  discussion of the procedural risks, benefits and alternatives. All questions were addressed. Maximal Sterile Barrier Technique was utilized including caps, mask, sterile gowns, sterile gloves, sterile drape, hand hygiene and skin antiseptic. A timeout was performed prior to the initiation of the procedure. Patient position supine on the ultrasound table. Right upper quadrant skin prepped and draped in usual sterile fashion. Following local lidocaine administration, 18 gauge by 0 pens needle was advanced into the right liver lesion, and three cores were obtained utilizing continuous ultrasound guidance. Samples were sent to pathology in formalin. Needle removed and hemostasis achieved with 5 minutes of manual compression. Post procedure ultrasound images showed no evidence of significant hemorrhage. IMPRESSION: Ultrasound-guided biopsy of right liver mass as above. Electronically Signed   By: Miachel Roux M.D.   On: 02/06/2021 15:14   ECHOCARDIOGRAM COMPLETE  Result Date: 01/17/2021    ECHOCARDIOGRAM REPORT   Patient Name:   Juan Wheeler Date of Exam: 01/17/2021 Medical Rec #:  [id number]     Height:       69.0 in Accession #:    [id number]    Weight:       148.8 lb Date of Birth:  16-Jan-1961     BSA:          1.822 m Patient Age:    3 years      BP:           106/65 mmHg Patient Gender: M             HR:           74 bpm. Exam Location:  [facility] Procedure: 2D Echo Indications:    Pulmonary  Embolus I26.09  History:        Patient has prior history of Echocardiogram examinations, most                 recent 11/16/2017. CAD, Signs/Symptoms:Chest Pain; Risk                 Factors:Hypertension and Current Smoker. Pulmonary Embolism,                 Elevated Troponin.  Sonographer:    Leavy Cella RDCS (AE) Referring Phys: [id number] OLADAPO ADEFESO IMPRESSIONS  1. Left ventricular ejection fraction, by estimation, is 30 to 35%. The left ventricle has moderately decreased function. The left ventricle demonstrates  global hypokinesis with some regional variation. Left ventricular diastolic parameters are indeterminate in the setting of atrial fibrillation.  2. Right ventricular systolic function is moderately reduced. The right ventricular size is normal. Tricuspid regurgitation signal is inadequate for assessing PA pressure.  3. Left atrial size was mildly dilated.  4. The mitral valve is grossly normal. Mild mitral valve regurgitation.  5. The aortic valve was not well visualized. There is mild calcification of the aortic valve. Aortic valve regurgitation is not visualized.  6. The inferior vena cava is normal in size with greater than 50% respiratory variability, suggesting right atrial pressure of 3 mmHg. FINDINGS  Left Ventricle: Left ventricular ejection fraction, by estimation, is 30 to 35%. The left ventricle has moderately decreased function. The left ventricle demonstrates global hypokinesis. The left ventricular internal cavity size was normal in size. There is no left ventricular hypertrophy. Left ventricular diastolic parameters are indeterminate. Right Ventricle: The right ventricular size is normal. No increase in right ventricular wall thickness. Right ventricular systolic function is moderately reduced. Tricuspid regurgitation signal is inadequate for assessing PA pressure. Left Atrium: Left atrial size was mildly dilated. Right Atrium: Right atrial size was normal in size. Pericardium: There is no evidence of pericardial effusion. Mitral Valve: The mitral valve is grossly normal. Mild mitral valve regurgitation. Tricuspid Valve: The tricuspid valve is grossly normal. Tricuspid valve regurgitation is trivial. Aortic Valve: The aortic valve was not well visualized. There is mild calcification of the aortic valve. There is mild to moderate aortic valve annular calcification. Aortic valve regurgitation is not visualized. Pulmonic Valve: The pulmonic valve was not well visualized. Pulmonic valve regurgitation is  not visualized. Aorta: The aortic root is normal in size and structure. Venous: The inferior vena cava is normal in size with greater than 50% respiratory variability, suggesting right atrial pressure of 3 mmHg. IAS/Shunts: No atrial level shunt detected by color flow Doppler.  LEFT VENTRICLE PLAX 2D LVIDd:         4.77 cm  Diastology LVIDs:         4.01 cm  LV e' medial:    9.25 cm/s LV PW:         1.06 cm  LV E/e' medial:  7.4 LV IVS:        1.08 cm  LV e' lateral:   16.20 cm/s LVOT diam:     1.90 cm  LV E/e' lateral: 4.3 LVOT Area:     2.84 cm  RIGHT VENTRICLE RV S prime:     9.14 cm/s TAPSE (M-mode): 1.6 cm LEFT ATRIUM           Index       RIGHT ATRIUM           Index LA diam:      3.60  cm 1.98 cm/m  RA Area:     19.80 cm LA Vol (A2C): 68.6 ml 37.65 ml/m RA Volume:   57.50 ml  31.56 ml/m LA Vol (A4C): 64.9 ml 35.62 ml/m   AORTA Ao Root diam: 2.70 cm MITRAL VALVE MV Area (PHT): 2.77 cm    SHUNTS MV Decel Time: 274 msec    Systemic Diam: 1.90 cm MV E velocity: 68.90 cm/s Rozann Lesches MD Electronically signed by Rozann Lesches MD Signature Date/Time: 01/17/2021/12:17:50 PM    Final      ASSESSMENT:  1. Stage IIa (UT3N0) rectal adenocarcinoma: -Xeloda plus radiation from 02/03/2011 through 03/13/2011. -LAR by [name], pathology [id number]. -XELOX was recommended but was not done secondary to pain reasons. -Colostomy revision for stomal prolapse in January 2020 by [name]. -Last CEA 11.4 on 03/05/2020. -CTAP on 04/03/2020 shows postsurgical/treatment changes in the rectum, perirectal soft tissues with probable left-sided seton stitch suggesting history of perianal fistula with no evidence of abscess. New 14 mm nodular opacity posterior right lung base, incompletely visualized. Changes of avascular necrosis of the left femoral head. -PET scan on 04/29/2020 shows thick-walled cavitary mass in the right lower lobe measuring 4.3 x 3.1 cm. Hypermetabolic focus in the right hilum likely lymph node.  Hypermetabolic activity in the subcarinal lymph node. Metabolic activity associated with presacral thickening extending into the left perianal region with a linear surgical seton. This is consistent with chronic presacral and left gluteal/perianal infection.  2. CKD: -Creatinine ranged between 1.3-1.6.  3. Tobacco abuse: -Patient smokes half pack per day for 45 years.  4. Stage IIIa right lung squamous cell carcinoma: -PET scan on 04/29/2020 shows right lower lobe thick-walled cavitary mass measuring 4.3 x 3.1 cm. Right middle lobe nodule 7 mm, unchanged from exam on 2019. Hypermetabolic right hilar lymph node with SUV 4.1. Hypermetabolic subcarinal lymph node 1.1 cm with SUV 3.4. -MRI of the brain on 05/21/2019 were negative for metastatic disease. -Right lower lobe biopsy on 05/08/2020 consistent with squamous cell carcinoma. -XRT started on 06/26/2020 with weekly carbotaxol on 06/27/2020. -XRT completed on 08/16/2020 with 5 weekly doses of carboplatin and Taxol. -CT chest showed cavitary mass measuring 3.1 x 2.4 cm, compared to the 5.9 x 3.9 cm. Left upper lobe lung nodule is unchanged. Left lower lobe lung nodule is stable. Subcarinal lymph nodes have not changed. Signs of hepatic steatosis with lobular hepatic contour is. -Imfinzi started on 09/12/2020.   PLAN:  1.Stage IIIa right lung squamous cell carcinoma: -PET CT scan showed increased metabolic activity at the site of newly discovered nodularity with SUV 13.1.  Area measures 3.1 x 2.3 cm.  Adjacent pleural thickening similar to recent chest CT.  Mildly enlarged subcarinal lymph node with SUV 4.3.  Mild increased metabolic activity in the right hilum.  Multifocal hepatic metastatic disease with marked hypermetabolic activity maximum SUV 12.8 in the left hepatic lobe measuring 4.1 x 3.2 cm.  Numerous additional foci of hepatic metastatic disease throughout the liver including right hepatic lobe.  Celiac lymph node 8 mm with  SUV 9.3.  No evidence of recurrence in the presacral region. - I have reviewed liver biopsy from 02/06/2021 which showed adenocarcinoma with positive CK7.  Negative for CK20, CDX2, p63, CK5/6, TTF-1, prostein and PSA.  Immunophenotype nonspecific with possibilities including pancreaticobiliary and upper GI. - There is no evidence of pancreaticobiliary or upper GI malignancy on the PET scan. - I will reach out to pathology and discuss this and compare it with the pathology  from 2011 at [facility]. - He reported lower quadrant pain after eating which eases up gradually.  He lost 6 pounds in the last 2 to 3 weeks. - He is eating 2-3 small meals per day.  I have recommended to start drinking boost 1 can daily. - RTC 1 week for follow-up.  2.Diffuse body pain/left posterior hip pain: -Currently taking hydrocodone 10/325 every 6 hours.  He reports that the pain is only controlled for couple of hours.  He will sleep at least an hour after taking each pill. - I will stop hydrocodone.  I will start him on Percocet 10/325 every 6 hours as needed.  3. CKD: -Baseline creatinine between 1.2-1.4 and stable.  4. Neuropathy: -Numbness in the right hand fingertip is stable.  5.  Bilateral lower lobe segmental and subsegmental pulmonary embolism: - Incidental finding on CT chest on 01/15/2021, confirmed with CT angiogram. - Continue Eliquis 5 mg twice daily.  No bleeding issues.  6.  Cardiomyopathy: -  Echo on 01/17/2021 shows EF 30-35%. - Continue metoprolol and losartan.   Orders placed this encounter:  No orders of the defined types were placed in this encounter.    [name], MD [place] [phone]   I, Thana Ates, am acting as a scribe for Dr. [name].  I, [name] MD, have reviewed the above documentation for accuracy and completeness, and I agree with the above.

## 2021-02-15 ENCOUNTER — Other Ambulatory Visit: Payer: Self-pay

## 2021-02-15 ENCOUNTER — Inpatient Hospital Stay (HOSPITAL_COMMUNITY)
Admission: EM | Admit: 2021-02-15 | Discharge: 2021-02-21 | DRG: 065 | Disposition: A | Discharge status: Rehabilitation Facility | Payer: Medicare PPO | Attending: Internal Medicine | Admitting: Internal Medicine

## 2021-02-15 ENCOUNTER — Inpatient Hospital Stay (HOSPITAL_COMMUNITY): Payer: Medicare PPO

## 2021-02-15 ENCOUNTER — Encounter (HOSPITAL_COMMUNITY): Payer: Self-pay | Admitting: Emergency Medicine

## 2021-02-15 ENCOUNTER — Emergency Department (HOSPITAL_COMMUNITY): Payer: Medicare PPO

## 2021-02-15 DIAGNOSIS — I69390 Apraxia following cerebral infarction: Secondary | ICD-10-CM | POA: Diagnosis not present

## 2021-02-15 DIAGNOSIS — Z9049 Acquired absence of other specified parts of digestive tract: Secondary | ICD-10-CM

## 2021-02-15 DIAGNOSIS — W1830XA Fall on same level, unspecified, initial encounter: Secondary | ICD-10-CM | POA: Diagnosis present

## 2021-02-15 DIAGNOSIS — I2510 Atherosclerotic heart disease of native coronary artery without angina pectoris: Secondary | ICD-10-CM | POA: Diagnosis not present

## 2021-02-15 DIAGNOSIS — I739 Peripheral vascular disease, unspecified: Secondary | ICD-10-CM | POA: Diagnosis present

## 2021-02-15 DIAGNOSIS — M50222 Other cervical disc displacement at C5-C6 level: Secondary | ICD-10-CM | POA: Diagnosis not present

## 2021-02-15 DIAGNOSIS — I255 Ischemic cardiomyopathy: Secondary | ICD-10-CM | POA: Diagnosis present

## 2021-02-15 DIAGNOSIS — F1210 Cannabis abuse, uncomplicated: Secondary | ICD-10-CM

## 2021-02-15 DIAGNOSIS — C20 Malignant neoplasm of rectum: Secondary | ICD-10-CM

## 2021-02-15 DIAGNOSIS — G459 Transient cerebral ischemic attack, unspecified: Secondary | ICD-10-CM | POA: Diagnosis present

## 2021-02-15 DIAGNOSIS — R29818 Other symptoms and signs involving the nervous system: Secondary | ICD-10-CM | POA: Diagnosis not present

## 2021-02-15 DIAGNOSIS — I639 Cerebral infarction, unspecified: Secondary | ICD-10-CM | POA: Diagnosis not present

## 2021-02-15 DIAGNOSIS — I429 Cardiomyopathy, unspecified: Secondary | ICD-10-CM | POA: Diagnosis present

## 2021-02-15 DIAGNOSIS — Z8249 Family history of ischemic heart disease and other diseases of the circulatory system: Secondary | ICD-10-CM

## 2021-02-15 DIAGNOSIS — R471 Dysarthria and anarthria: Secondary | ICD-10-CM | POA: Diagnosis present

## 2021-02-15 DIAGNOSIS — M50221 Other cervical disc displacement at C4-C5 level: Secondary | ICD-10-CM | POA: Diagnosis not present

## 2021-02-15 DIAGNOSIS — Z79899 Other long term (current) drug therapy: Secondary | ICD-10-CM

## 2021-02-15 DIAGNOSIS — C3490 Malignant neoplasm of unspecified part of unspecified bronchus or lung: Secondary | ICD-10-CM

## 2021-02-15 DIAGNOSIS — I5042 Chronic combined systolic (congestive) and diastolic (congestive) heart failure: Secondary | ICD-10-CM | POA: Diagnosis not present

## 2021-02-15 DIAGNOSIS — I499 Cardiac arrhythmia, unspecified: Secondary | ICD-10-CM | POA: Diagnosis not present

## 2021-02-15 DIAGNOSIS — Z720 Tobacco use: Secondary | ICD-10-CM | POA: Diagnosis not present

## 2021-02-15 DIAGNOSIS — C029 Malignant neoplasm of tongue, unspecified: Secondary | ICD-10-CM | POA: Diagnosis not present

## 2021-02-15 DIAGNOSIS — Z85048 Personal history of other malignant neoplasm of rectum, rectosigmoid junction, and anus: Secondary | ICD-10-CM

## 2021-02-15 DIAGNOSIS — C787 Secondary malignant neoplasm of liver and intrahepatic bile duct: Secondary | ICD-10-CM | POA: Diagnosis present

## 2021-02-15 DIAGNOSIS — D696 Thrombocytopenia, unspecified: Secondary | ICD-10-CM

## 2021-02-15 DIAGNOSIS — Z885 Allergy status to narcotic agent status: Secondary | ICD-10-CM

## 2021-02-15 DIAGNOSIS — Z7982 Long term (current) use of aspirin: Secondary | ICD-10-CM

## 2021-02-15 DIAGNOSIS — E119 Type 2 diabetes mellitus without complications: Secondary | ICD-10-CM | POA: Diagnosis present

## 2021-02-15 DIAGNOSIS — I6502 Occlusion and stenosis of left vertebral artery: Secondary | ICD-10-CM | POA: Diagnosis present

## 2021-02-15 DIAGNOSIS — I749 Embolism and thrombosis of unspecified artery: Secondary | ICD-10-CM

## 2021-02-15 DIAGNOSIS — I482 Chronic atrial fibrillation, unspecified: Secondary | ICD-10-CM | POA: Diagnosis not present

## 2021-02-15 DIAGNOSIS — R29810 Facial weakness: Secondary | ICD-10-CM | POA: Diagnosis present

## 2021-02-15 DIAGNOSIS — I10 Essential (primary) hypertension: Secondary | ICD-10-CM | POA: Diagnosis present

## 2021-02-15 DIAGNOSIS — R531 Weakness: Secondary | ICD-10-CM

## 2021-02-15 DIAGNOSIS — Z20822 Contact with and (suspected) exposure to covid-19: Secondary | ICD-10-CM | POA: Diagnosis not present

## 2021-02-15 DIAGNOSIS — Z85118 Personal history of other malignant neoplasm of bronchus and lung: Secondary | ICD-10-CM

## 2021-02-15 DIAGNOSIS — W19XXXA Unspecified fall, initial encounter: Secondary | ICD-10-CM | POA: Diagnosis not present

## 2021-02-15 DIAGNOSIS — Z932 Ileostomy status: Secondary | ICD-10-CM

## 2021-02-15 DIAGNOSIS — E785 Hyperlipidemia, unspecified: Secondary | ICD-10-CM | POA: Diagnosis not present

## 2021-02-15 DIAGNOSIS — E871 Hypo-osmolality and hyponatremia: Secondary | ICD-10-CM | POA: Diagnosis not present

## 2021-02-15 DIAGNOSIS — R4781 Slurred speech: Secondary | ICD-10-CM | POA: Diagnosis present

## 2021-02-15 DIAGNOSIS — G629 Polyneuropathy, unspecified: Secondary | ICD-10-CM | POA: Diagnosis not present

## 2021-02-15 DIAGNOSIS — M5021 Other cervical disc displacement,  high cervical region: Secondary | ICD-10-CM | POA: Diagnosis not present

## 2021-02-15 DIAGNOSIS — R000 Tachycardia, unspecified: Secondary | ICD-10-CM | POA: Diagnosis not present

## 2021-02-15 DIAGNOSIS — I69351 Hemiplegia and hemiparesis following cerebral infarction affecting right dominant side: Secondary | ICD-10-CM | POA: Diagnosis not present

## 2021-02-15 DIAGNOSIS — F17210 Nicotine dependence, cigarettes, uncomplicated: Secondary | ICD-10-CM | POA: Diagnosis present

## 2021-02-15 DIAGNOSIS — I4891 Unspecified atrial fibrillation: Secondary | ICD-10-CM

## 2021-02-15 DIAGNOSIS — R29700 NIHSS score 0: Secondary | ICD-10-CM | POA: Diagnosis present

## 2021-02-15 DIAGNOSIS — I2699 Other pulmonary embolism without acute cor pulmonale: Secondary | ICD-10-CM

## 2021-02-15 DIAGNOSIS — Z955 Presence of coronary angioplasty implant and graft: Secondary | ICD-10-CM

## 2021-02-15 DIAGNOSIS — K219 Gastro-esophageal reflux disease without esophagitis: Secondary | ICD-10-CM | POA: Diagnosis present

## 2021-02-15 DIAGNOSIS — G9389 Other specified disorders of brain: Secondary | ICD-10-CM | POA: Diagnosis not present

## 2021-02-15 DIAGNOSIS — I6340 Cerebral infarction due to embolism of unspecified cerebral artery: Principal | ICD-10-CM | POA: Diagnosis present

## 2021-02-15 DIAGNOSIS — G8191 Hemiplegia, unspecified affecting right dominant side: Secondary | ICD-10-CM | POA: Diagnosis not present

## 2021-02-15 DIAGNOSIS — N179 Acute kidney failure, unspecified: Secondary | ICD-10-CM | POA: Diagnosis not present

## 2021-02-15 DIAGNOSIS — G894 Chronic pain syndrome: Secondary | ICD-10-CM | POA: Diagnosis present

## 2021-02-15 DIAGNOSIS — G8929 Other chronic pain: Secondary | ICD-10-CM | POA: Diagnosis not present

## 2021-02-15 DIAGNOSIS — M4802 Spinal stenosis, cervical region: Secondary | ICD-10-CM | POA: Diagnosis not present

## 2021-02-15 DIAGNOSIS — Z7901 Long term (current) use of anticoagulants: Secondary | ICD-10-CM

## 2021-02-15 DIAGNOSIS — M549 Dorsalgia, unspecified: Secondary | ICD-10-CM | POA: Diagnosis not present

## 2021-02-15 DIAGNOSIS — Z86718 Personal history of other venous thrombosis and embolism: Secondary | ICD-10-CM

## 2021-02-15 DIAGNOSIS — Z86711 Personal history of pulmonary embolism: Secondary | ICD-10-CM

## 2021-02-15 LAB — CBC
HCT: 45 % (ref 39.0–52.0)
Hemoglobin: 15.1 g/dL (ref 13.0–17.0)
MCH: 33.9 pg (ref 26.0–34.0)
MCHC: 33.6 g/dL (ref 30.0–36.0)
MCV: 101.1 fL — ABNORMAL HIGH (ref 80.0–100.0)
Platelets: 134 10*3/uL — ABNORMAL LOW (ref 150–400)
RBC: 4.45 MIL/uL (ref 4.22–5.81)
RDW: 15.9 % — ABNORMAL HIGH (ref 11.5–15.5)
WBC: 7.3 10*3/uL (ref 4.0–10.5)
nRBC: 0 % (ref 0.0–0.2)

## 2021-02-15 LAB — COMPREHENSIVE METABOLIC PANEL
ALT: 25 U/L (ref 0–44)
AST: 28 U/L (ref 15–41)
Albumin: 3.1 g/dL — ABNORMAL LOW (ref 3.5–5.0)
Alkaline Phosphatase: 118 U/L (ref 38–126)
Anion gap: 6 (ref 5–15)
BUN: 11 mg/dL (ref 6–20)
CO2: 27 mmol/L (ref 22–32)
Calcium: 9.1 mg/dL (ref 8.9–10.3)
Chloride: 106 mmol/L (ref 98–111)
Creatinine, Ser: 1.21 mg/dL (ref 0.61–1.24)
GFR, Estimated: >60 mL/min (ref >60)
Glucose, Bld: 117 mg/dL — ABNORMAL HIGH (ref 70–99)
Potassium: 3.9 mmol/L (ref 3.5–5.1)
Sodium: 139 mmol/L (ref 135–145)
Total Bilirubin: 0.9 mg/dL (ref 0.3–1.2)
Total Protein: 7.1 g/dL (ref 6.5–8.1)

## 2021-02-15 LAB — RAPID URINE DRUG SCREEN, HOSP PERFORMED
Amphetamines: NOT DETECTED
Barbiturates: NOT DETECTED
Benzodiazepines: NOT DETECTED
Cocaine: NOT DETECTED
Opiates: NOT DETECTED
Tetrahydrocannabinol: POSITIVE — AB

## 2021-02-15 LAB — ETHANOL: Alcohol, Ethyl (B): <10 mg/dL (ref <10)

## 2021-02-15 LAB — I-STAT CHEM 8, ED
BUN: 9 mg/dL (ref 6–20)
Calcium, Ion: 1.23 mmol/L (ref 1.15–1.40)
Chloride: 105 mmol/L (ref 98–111)
Creatinine, Ser: 1.2 mg/dL (ref 0.61–1.24)
Glucose, Bld: 107 mg/dL — ABNORMAL HIGH (ref 70–99)
HCT: 46 % (ref 39.0–52.0)
Hemoglobin: 15.6 g/dL (ref 13.0–17.0)
Potassium: 4 mmol/L (ref 3.5–5.1)
Sodium: 142 mmol/L (ref 135–145)
TCO2: 29 mmol/L (ref 22–32)

## 2021-02-15 LAB — URINALYSIS, ROUTINE W REFLEX MICROSCOPIC
Bilirubin Urine: NEGATIVE
Glucose, UA: NEGATIVE mg/dL
Hgb urine dipstick: NEGATIVE
Ketones, ur: NEGATIVE mg/dL
Leukocytes,Ua: NEGATIVE
Nitrite: NEGATIVE
Protein, ur: NEGATIVE mg/dL
Specific Gravity, Urine: 1.016 (ref 1.005–1.030)
pH: 5 (ref 5.0–8.0)

## 2021-02-15 LAB — AMMONIA: Ammonia: 12 umol/L (ref 9–35)

## 2021-02-15 LAB — DIFFERENTIAL
Abs Immature Granulocytes: 0.02 10*3/uL (ref 0.00–0.07)
Basophils Absolute: 0 10*3/uL (ref 0.0–0.1)
Basophils Relative: 1 %
Eosinophils Absolute: 0.2 10*3/uL (ref 0.0–0.5)
Eosinophils Relative: 3 %
Immature Granulocytes: 0 %
Lymphocytes Relative: 8 %
Lymphs Abs: 0.6 10*3/uL — ABNORMAL LOW (ref 0.7–4.0)
Monocytes Absolute: 0.7 10*3/uL (ref 0.1–1.0)
Monocytes Relative: 10 %
Neutro Abs: 5.8 10*3/uL (ref 1.7–7.7)
Neutrophils Relative %: 78 %

## 2021-02-15 LAB — BRAIN NATRIURETIC PEPTIDE: B Natriuretic Peptide: 535 pg/mL — ABNORMAL HIGH (ref 0.0–100.0)

## 2021-02-15 LAB — PROTIME-INR
INR: 1.3 — ABNORMAL HIGH (ref 0.8–1.2)
Prothrombin Time: 15.9 seconds — ABNORMAL HIGH (ref 11.4–15.2)

## 2021-02-15 LAB — APTT: aPTT: 45 seconds — ABNORMAL HIGH (ref 24–36)

## 2021-02-15 LAB — RESP PANEL BY RT-PCR (FLU A&B, COVID) ARPGX2
Influenza A by PCR: NEGATIVE
Influenza B by PCR: NEGATIVE
SARS Coronavirus 2 by RT PCR: NEGATIVE

## 2021-02-15 LAB — TSH: TSH: 0.979 u[IU]/mL (ref 0.350–4.500)

## 2021-02-15 MED ORDER — STROKE: EARLY STAGES OF RECOVERY BOOK
Freq: Once | Status: AC
  Filled 2021-02-15 (×2): qty 1

## 2021-02-15 MED ORDER — HYDROCODONE-ACETAMINOPHEN 10-325 MG PO TABS
1.0000 | ORAL_TABLET | ORAL | Status: DC | PRN
  Administered 2021-02-15 – 2021-02-20 (×8): 1 via ORAL
  Filled 2021-02-15 (×9): qty 1

## 2021-02-15 MED ORDER — SIMVASTATIN 20 MG PO TABS
40.0000 mg | ORAL_TABLET | Freq: Every day | ORAL | Status: DC
  Administered 2021-02-16 – 2021-02-20 (×5): 40 mg via ORAL
  Filled 2021-02-15 (×3): qty 2
  Filled 2021-02-15: qty 4
  Filled 2021-02-15 (×4): qty 2

## 2021-02-15 MED ORDER — APIXABAN 5 MG PO TABS
5.0000 mg | ORAL_TABLET | Freq: Two times a day (BID) | ORAL | Status: DC
  Administered 2021-02-15 – 2021-02-17 (×4): 5 mg via ORAL
  Filled 2021-02-15 (×4): qty 1

## 2021-02-15 MED ORDER — ASPIRIN EC 81 MG PO TBEC
81.0000 mg | DELAYED_RELEASE_TABLET | Freq: Every day | ORAL | Status: DC
  Administered 2021-02-16 – 2021-02-21 (×6): 81 mg via ORAL
  Filled 2021-02-15 (×6): qty 1

## 2021-02-15 MED ORDER — METOPROLOL TARTRATE 5 MG/5ML IV SOLN
5.0000 mg | Freq: Once | INTRAVENOUS | Status: DC
  Filled 2021-02-15: qty 5

## 2021-02-15 MED ORDER — SODIUM CHLORIDE 0.9 % IV SOLN: INTRAVENOUS | Status: AC

## 2021-02-15 MED ORDER — GADOBUTROL 1 MMOL/ML IV SOLN
7.0000 mL | Freq: Once | INTRAVENOUS | Status: AC | PRN
  Administered 2021-02-15: 7 mL via INTRAVENOUS

## 2021-02-15 MED ORDER — CHLORHEXIDINE GLUCONATE CLOTH 2 % EX PADS
6.0000 | MEDICATED_PAD | Freq: Every day | CUTANEOUS | Status: DC
  Administered 2021-02-16 – 2021-02-21 (×6): 6 via TOPICAL

## 2021-02-15 MED ORDER — PANTOPRAZOLE SODIUM 40 MG PO TBEC
40.0000 mg | DELAYED_RELEASE_TABLET | Freq: Every day | ORAL | Status: DC
  Administered 2021-02-15 – 2021-02-21 (×7): 40 mg via ORAL
  Filled 2021-02-15 (×7): qty 1

## 2021-02-15 MED ORDER — SODIUM CHLORIDE 0.9 % IV BOLUS
500.0000 mL | Freq: Once | INTRAVENOUS | Status: AC
  Administered 2021-02-15: 500 mL via INTRAVENOUS

## 2021-02-15 MED ORDER — METOPROLOL TARTRATE 5 MG/5ML IV SOLN
5.0000 mg | Freq: Four times a day (QID) | INTRAVENOUS | Status: DC | PRN
  Administered 2021-02-16 – 2021-02-19 (×4): 5 mg via INTRAVENOUS
  Filled 2021-02-15 (×4): qty 5

## 2021-02-15 NOTE — Evaluation (Signed)
Physical Therapy Evaluation Patient Details Name: Juan Wheeler MRN: [id number] DOB: 09-23-1961 Today's Date: 02/15/2021   History of Present Illness  Juan Wheeler is a 60 y.o. male with medical history significant for squamous cell carcinoma of the lung, rectal adenocarcinoma, neuropathy, cardiomyopathy with LVEF 30-35%, bilateral PE on Eliquis, atrial fibrillation, and CAD/PAD who presented to the ED this morning after acute onset of diffuse weakness at home.  His last known well around 6:30 AM and symptoms began shortly after that.  He tried to use his urine with his right hand but it fell out of his hands and when he tried to pick it up from the floor with his other hand and he dropped it again.  His legs then became weak after he went to his couch.  He tried to get up but he could not do so on account of the weakness.  After few minutes of this he called his wife and he was noted to have a blank look on his face.  His wife called EMS and he was brought to the ED for further evaluation.  He states he has been compliant with his medications to include Eliquis daily.  He denies any fevers, chills, chest pain, shortness of breath, palpitations, abdominal pain, nausea, or vomiting.  He denies headache or vision changes.  Clinical Impression   Patient presents supine in bed, is awake, alert and cooperative. His wife is at bedside. Patient able to perform bed mobility Mod I, using UEs, and in slow, labored manner. Patient able to maintain seated balance and perform sit to stand Mod I using UEs. Patient able to take 2 small steps at bed side with standing marches, and maintains balance independently. Patient returned to sitting position and educated on seated LE strength activity. Also performed LE MMT which are [facility]. Patients wife noted patient has been having trouble with UE and grip strength with holding cell phone today. Will defer to OT evaluation for further assessment. Recommend home health PT if  necessary for continued strength and conditioning for improved functional mobility. Patient will benefit from continued physical therapy in hospital and recommended venue below to increase strength, balance, endurance for safe ADLs and gait.       Follow Up Recommendations Supervision - Intermittent;Supervision for mobility/OOB;Home health PT    Equipment Recommendations  Rolling walker with 5" wheels    Recommendations for Other Services OT consult     Precautions / Restrictions Precautions Precautions: Fall Restrictions Weight Bearing Restrictions: No      Mobility  Bed Mobility Overal bed mobility: Modified Independent             General bed mobility comments: Slow moving, but able to do Mod I using UEs    Transfers Overall transfer level: Modified independent Equipment used: None             General transfer comment: No AD, uses UEs  Ambulation/Gait Ambulation/Gait assistance: Supervision   Assistive device: None;1 person hand held assist       General Gait Details: 2-3 small steps at bedside with standing marches, CG assist, no LOB, no UE support  Stairs            Wheelchair Mobility    Modified Rankin (Stroke Patients Only)       Balance Overall balance assessment: Modified Independent Sitting-balance support: Bilateral upper extremity supported;Feet supported Sitting balance-Leahy Scale: Good Sitting balance - Comments: seated at EOB   Standing balance support: No upper extremity  supported Standing balance-Leahy Scale: Good                               Pertinent Vitals/Pain Pain Assessment: No/denies pain    Home Living Family/patient expects to be discharged to:: Private residence Living Arrangements: Spouse/significant other Available Help at Discharge: Family Type of Home: House Home Access: Stairs to enter     Home Layout: One level Home Equipment: Kasandra Knudsen - single point      Prior Function Level of  Independence: Independent               Hand Dominance        Extremity/Trunk Assessment   Upper Extremity Assessment Upper Extremity Assessment: Defer to OT evaluation    Lower Extremity Assessment Lower Extremity Assessment: Overall WFL for tasks assessed       Communication   Communication: No difficulties  Cognition Arousal/Alertness: Awake/alert Behavior During Therapy: WFL for tasks assessed/performed Overall Cognitive Status: Within Functional Limits for tasks assessed                                        General Comments      Exercises General Exercises - Lower Extremity Ankle Circles/Pumps: Both;10 reps;Seated Long Arc Quad: Left;10 reps;Seated Hip Flexion/Marching: Both;Seated;Standing;10 reps   Assessment/Plan    PT Assessment Patient needs continued PT services  PT Problem List         PT Treatment Interventions Functional mobility training;Therapeutic exercise;Therapeutic activities;Balance training;Neuromuscular re-education;Patient/family education;Stair training;Gait training;DME instruction    PT Goals (Current goals can be found in the Care Plan section)  Acute Rehab PT Goals Patient Stated Goal: Return home PT Goal Formulation: With patient/family Time For Goal Achievement: 03/01/21 Potential to Achieve Goals: Good    Frequency Min 5X/week   Barriers to discharge        Co-evaluation               AM-PAC PT "6 Clicks" Mobility  Outcome Measure Help needed turning from your back to your side while in a flat bed without using bedrails?: A Little Help needed moving from lying on your back to sitting on the side of a flat bed without using bedrails?: A Little Help needed moving to and from a bed to a chair (including a wheelchair)?: A Little Help needed standing up from a chair using your arms (e.g., wheelchair or bedside chair)?: None Help needed to walk in hospital room?: A Little Help needed climbing 3-5  steps with a railing? : A Little 6 Click Score: 19    End of Session Equipment Utilized During Treatment: Gait belt Activity Tolerance: Patient tolerated treatment well Patient left: in bed;with family/visitor present;with call bell/phone within reach Nurse Communication: Mobility status PT Visit Diagnosis: Other abnormalities of gait and mobility (R26.89);Muscle weakness (generalized) (M62.81);Difficulty in walking, not elsewhere classified (R26.2)    Time: 0210-0225 PT Time Calculation (min) (ACUTE ONLY): 15 min   Charges:   PT Evaluation $PT Eval Low Complexity: 1 Low         4:00 PM, 02/15/21 Josue Hector PT DPT  Physical Therapist with [facility]  [phone]

## 2021-02-15 NOTE — Progress Notes (Shared)
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 to 35%. The left ventricle has moderately decreased function. The left ventricle demonstrates global hypokinesis. The left ventricular internal cavity size was normal in size. There is no left ventricular hypertrophy. Left ventricular diastolic parameters are indeterminate. Right Ventricle: The right ventricular size is normal. No increase in right ventricular wall thickness. Right ventricular systolic function is moderately reduced. Tricuspid regurgitation signal is inadequate for assessing PA pressure. Left Atrium: Left atrial size was mildly dilated. Right Atrium: Right atrial size was normal in size. Pericardium: There is no evidence of pericardial effusion. Mitral Valve: The mitral valve is grossly normal. Mild mitral valve regurgitation. Tricuspid Valve: The tricuspid valve is grossly normal. Tricuspid valve regurgitation is trivial. Aortic Valve: The aortic valve was not well visualized. There is mild calcification of the aortic valve. There is mild to moderate aortic valve annular calcification. Aortic valve regurgitation is not visualized. Pulmonic Valve: The pulmonic valve was not well visualized. Pulmonic valve regurgitation is not  visualized. Aorta: The aortic root is normal in size and structure. Venous: The inferior vena cava is normal in size with greater than 50% respiratory variability, suggesting right atrial pressure of 3 mmHg. IAS/Shunts: No atrial level shunt detected by color flow Doppler.  LEFT VENTRICLE PLAX 2D LVIDd:         4.77 cm  Diastology LVIDs:         4.01 cm  LV e' medial:    9.25 cm/s LV PW:         1.06 cm  LV E/e' medial:  7.4 LV IVS:        1.08 cm  LV e' lateral:   16.20 cm/s LVOT diam:     1.90 cm  LV E/e' lateral: 4.3 LVOT Area:     2.84 cm  RIGHT VENTRICLE RV S prime:     9.14 cm/s TAPSE (M-mode): 1.6 cm LEFT ATRIUM           Index       RIGHT ATRIUM           Index LA diam:      3.60 cm 1.98 cm/m  RA Area:     19.80 cm LA Vol (A2C): 68.6 ml 37.65 ml/m RA Volume:   57.50 ml  31.56 ml/m LA Vol (A4C): 64.9 ml 35.62 ml/m   AORTA Ao Root diam: 2.70 cm MITRAL VALVE MV Area (PHT): 2.77 cm    SHUNTS MV Decel Time: 274 msec    Systemic Diam: 1.90 cm MV E velocity: 68.90 cm/s Rozann Lesches MD Electronically signed by Rozann Lesches MD Signature Date/Time: 01/17/2021/12:17:50 PM    Final    CT HEAD CODE STROKE WO CONTRAST  Result Date: 02/15/2021 CLINICAL DATA:  Code stroke. 60 year old male with acute neurologic deficit. Colorectal Wheeler. EXAM: CT HEAD WITHOUT CONTRAST TECHNIQUE: Contiguous axial images were obtained from the base of the skull through the vertex without intravenous contrast. COMPARISON:  Brain MRI 05/20/2020. FINDINGS: Brain: Broad-based area of course pachymeningeal calcification along the left superior hemisphere convexity is stable from the MRI last year although more conspicuous on CT. No acute intracranial hemorrhage identified. No acute intracranial mass  effect. No ventriculomegaly. Widespread patchy white matter hypodensity, more pronounced in the right hemisphere. No cortically based acute infarct identified. No cortical encephalomalacia identified. Vascular: Calcified  atherosclerosis at the skull base. No suspicious intracranial vascular hyperdensity. Skull: No acute or suspicious osseous lesion identified. Sinuses/Orbits: Visualized paranasal sinuses and mastoids are stable and well aerated. Other: Visualized orbits and scalp soft tissues are within normal limits. ASPECTS [facility] Stroke Program Early CT Score) Total score (0-10 with 10 being normal): 10 IMPRESSION: 1. No acute cortically based infarct or acute intracranial hemorrhage identified. ASPECTS 10. 2. Coarse chronic dural calcification along the left hemisphere, perhaps sequelae of a remote subdural hematoma or infection. Chronic white matter disease. Electronically Signed   By: Genevie Ann M.D.   On: 02/15/2021 09:07     ASSESSMENT:  1. Stage IIa (UT3N0) rectal adenocarcinoma: -Xeloda plus radiation from 02/03/2011 through 03/13/2011. -LAR by [name], pathology [id number]. -XELOX was recommended but was not done secondary to pain reasons. -Colostomy revision for stomal prolapse in January 2020 by [name]. -Last CEA 11.4 on 03/05/2020. -CTAP on 04/03/2020 shows postsurgical/treatment changes in the rectum, perirectal soft tissues with probable left-sided seton stitch suggesting history of perianal fistula with no evidence of abscess. New 14 mm nodular opacity posterior right lung base, incompletely visualized. Changes of avascular necrosis of the left femoral head. -PET scan on 04/29/2020 shows thick-walled cavitary mass in the right lower lobe measuring 4.3 x 3.1 cm. Hypermetabolic focus in the right hilum likely lymph node. Hypermetabolic activity in the subcarinal lymph node. Metabolic activity associated with presacral thickening extending into the left perianal region with a linear surgical seton. This is consistent with chronic presacral and left gluteal/perianal infection.  2. CKD: -Creatinine ranged between 1.3-1.6.  3. Tobacco abuse: -Patient smokes half pack per day for 45 years.  4.  Stage IIIa right lung squamous cell carcinoma: -PET scan on 04/29/2020 shows right lower lobe thick-walled cavitary mass measuring 4.3 x 3.1 cm. Right middle lobe nodule 7 mm, unchanged from exam on 2019. Hypermetabolic right hilar lymph node with SUV 4.1. Hypermetabolic subcarinal lymph node 1.1 cm with SUV 3.4. -MRI of the brain on 05/21/2019 were negative for metastatic disease. -Right lower lobe biopsy on 05/08/2020 consistent with squamous cell carcinoma. -XRT started on 06/26/2020 with weekly carbotaxol on 06/27/2020. -XRT completed on 08/16/2020 with 5 weekly doses of carboplatin and Taxol. -CT chest showed cavitary mass measuring 3.1 x 2.4 cm, compared to the 5.9 x 3.9 cm. Left upper lobe lung nodule is unchanged. Left lower lobe lung nodule is stable. Subcarinal lymph nodes have not changed. Signs of hepatic steatosis with lobular hepatic contour is. -Imfinzi started on 09/12/2020.   PLAN:  1.Stage IIIa right lung squamous cell carcinoma: -PET CT scan showed increased metabolic activity at the site of newly discovered nodularity with SUV 13.1.  Area measures 3.1 x 2.3 cm.  Adjacent pleural thickening similar to recent chest CT.  Mildly enlarged subcarinal lymph node with SUV 4.3.  Mild increased metabolic activity in the right hilum.  Multifocal hepatic metastatic disease with marked hypermetabolic activity maximum SUV 12.8 in the left hepatic lobe measuring 4.1 x 3.2 cm.  Numerous additional foci of hepatic metastatic disease throughout the liver including right hepatic lobe.  Celiac lymph node 8 mm with SUV 9.3.  No evidence of recurrence in the presacral region. - I have reviewed liver biopsy from 02/06/2021 which showed adenocarcinoma with positive CK7.  Negative for CK20, CDX2, p63, CK5/6, TTF-1, prostein and  PSA.  Immunophenotype nonspecific with possibilities including pancreaticobiliary and upper GI. - There is no evidence of pancreaticobiliary or upper GI malignancy on the PET  scan. - I will reach out to pathology and discuss this and compare it with the pathology from 2011 at [facility]. - He reported lower quadrant pain after eating which eases up gradually.  He lost 6 pounds in the last 2 to 3 weeks. - He is eating 2-3 small meals per day.  I have recommended to start drinking boost 1 can daily. - RTC 1 week for follow-up.  2.Diffuse body pain/left posterior hip pain: -Currently taking hydrocodone 10/325 every 6 hours.  He reports that the pain is only controlled for couple of hours.  He will sleep at least an hour after taking each pill. - I will stop hydrocodone.  I will start him on Percocet 10/325 every 6 hours as needed.  3. CKD: -Baseline creatinine between 1.2-1.4 and stable.  4. Neuropathy: -Numbness in the right hand fingertip is stable.  5. Bilateral lower lobe segmental and subsegmental pulmonary embolism: - Incidental finding on CT chest on 01/15/2021, confirmed with CT angiogram. - Continue Eliquis 5 mg twice daily.  No bleeding issues.  6. Cardiomyopathy: - Echo on 01/17/2021 shows EF 30-35%. - Continue metoprolol and losartan.   Orders placed this encounter:  No orders of the defined types were placed in this encounter.    Juan Jack, MD [place] [phone]   I, Thana Ates, am acting as a scribe for Dr. Derek Wheeler.  {Add Barista Statement}

## 2021-02-15 NOTE — Progress Notes (Signed)
PT arrived to unit Hospitalist notfied. VSS, pts bed is low and bed alarm is set. Call bell in reach and report was handed off to PM nurse.

## 2021-02-15 NOTE — H&P (Addendum)
History and Physical    FARID GRIGORIAN PJK:[id number] DOB: 1961-01-18 DOA: 02/15/2021  PCP: [name], MD   Patient coming from: Home  Chief Complaint: Diffuse weakness/coordination difficulty  HPI: Juan Wheeler is a 60 y.o. male with medical history significant for squamous cell carcinoma of the lung, rectal adenocarcinoma, neuropathy, cardiomyopathy with LVEF 30-35%, bilateral PE on Eliquis, atrial fibrillation, and CAD/PAD who presented to the ED this morning after acute onset of diffuse weakness at home.  His last known well around 6:30 AM and symptoms began shortly after that.  He tried to use his urine with his right hand but it fell out of his hands and when he tried to pick it up from the floor with his other hand and he dropped it again.  His legs then became weak after he went to his couch.  He tried to get up but he could not do so on account of the weakness.  After few minutes of this he called his wife and he was noted to have a blank look on his face.  His wife called EMS and he was brought to the ED for further evaluation.  He states he has been compliant with his medications to include Eliquis daily.  He denies any fevers, chills, chest pain, shortness of breath, palpitations, abdominal pain, nausea, or vomiting.  He denies headache or vision changes.   ED Course: Vital signs stable with mildly elevated heart rates currently 90-100 bpm.  Code stroke was called in the ED.  Laboratory data unremarkable aside from some mild thrombocytopenia.  CT of the head with no acute findings noted.  He has been evaluated by teleneurology with recommendations for brain and cervical spine MRI as well as MRA of the head and neck.  He was not noted to be a candidate for tPA due to his use of Eliquis.  He will also require PT/OT evaluation and permissive hypertension.  He is to continue on his Eliquis and statin.  He will need transfer to [facility] for imaging as well as neurology evaluation. UDS  positive for marijuana.  Review of Systems: Reviewed as noted above, otherwise negative.  Past Medical History:  Diagnosis Date  . Chronic lower back pain    a. Followed by pain management at [facility]  . Colon cancer [facility])    rectal cancer  . Coronary artery disease    a. 03/2013: abnl nuc -> LHC s/p DES to LCx, residual moderate disease in LAD (med rx unless refractory angina). b. Not on BB due to bradycardia.  Marland Kitchen DVT (deep venous thrombosis) ([place]) ~ 2013  . Dysrhythmia    AFib  . GERD (gastroesophageal reflux disease)   . H/O necrotising fasciitis   . History of blood transfusion    "once; after throwing up alot of blood" (04/17/2013)  . Hypertension   . LV dysfunction    a. EF 45% in 03/2013.  Marland Kitchen PAD (peripheral artery disease) ([place])    a. Occlusion of the right internal iliac artery, with significant atherosclerosis in the left internal iliac which was not amenable to reconstruction per notes from [place] in place 05/30/2020  . Pulmonary nodules 09/30/2014  . Rectal cancer ([place])   . Tobacco abuse     Past Surgical History:  Procedure Laterality Date  . ABDOMINAL SURGERY  1990's   'for stomach ulcers" (04/17/2013)  . COLECTOMY  2012   "for rectal cancer" (04/17/2013)  . COLONOSCOPY  2013  Dr. Cheryll Cockayne: colorectal anastomosis with ulcer and inflammation, benign biopsy  . COLONOSCOPY WITH PROPOFOL N/A 09/07/2016   Procedure: COLONOSCOPY WITH PROPOFOL;  Surgeon: Daneil Dolin, MD;  [facility];  Service: Endoscopy;  Laterality: N/A;  10:00 am Colonoscopy via rectum and ostomy  . COLOSTOMY TAKEDOWN  2013  . CORONARY ANGIOPLASTY WITH STENT PLACEMENT  04/17/2013   "?1" (04/17/2013)  . FEMORAL-POPLITEAL BYPASS GRAFT Left 07/02/2015   Procedure: BYPASS GRAFT LEFT COMMON FEMORAL ARTERY TO LEFT ABOVE KNEE POPLITEAL ARTERY - USING LEFT GREATER SAPPHENOUS VEIN;  Surgeon: Elam Dutch, MD;  Location: [place];  Service: Vascular;  Laterality: Left;   . INCISION AND DRAINAGE ABSCESS Left 06/05/2016   Procedure: INCISION AND DRAINAGE ABSCESS;  Surgeon: Aviva Signs, MD;  Location: [facility];  Service: General;  Laterality: Left;  . INCISION AND DRAINAGE PERIRECTAL ABSCESS Left 06/07/2016   Procedure: IRRIGATION AND DEBRIDEMENT LEFT BUTTOCK ABSCESS;  Surgeon: Greer Pickerel, MD;  Location: [place];  Service: General;  Laterality: Left;  . INGUINAL HERNIA REPAIR Bilateral 1990's  . LAPAROSCOPIC PARTIAL COLECTOMY N/A 06/11/2016   Procedure: LAPAROSCOPIC  OPEN COLOSTOMY;  Surgeon: [name], MD;  Location: [place];  Service: General;  Laterality: N/A;  . LEFT HEART CATHETERIZATION WITH CORONARY ANGIOGRAM N/A 04/17/2013   Procedure: LEFT HEART CATHETERIZATION WITH CORONARY ANGIOGRAM;  Surgeon: Peter M Martinique, MD;  Location: [facility] CATH LAB;  Service: Cardiovascular;  Laterality: N/A;  . PERCUTANEOUS STENT INTERVENTION  04/17/2013   Procedure: PERCUTANEOUS STENT INTERVENTION;  Surgeon: Peter M Martinique, MD;  Location: [facility] CATH LAB;  Service: Cardiovascular;;  . PERIPHERAL VASCULAR CATHETERIZATION N/A 06/14/2015   Procedure: Abdominal Aortogram;  Surgeon: Elam Dutch, MD;  Location: [place] CV LAB;  Service: Cardiovascular;  Laterality: N/A;  . PERIPHERAL VASCULAR CATHETERIZATION Bilateral 06/14/2015   Procedure: Lower Extremity Angiography;  Surgeon: Elam Dutch, MD;  Location: [place] CV LAB;  Service: Cardiovascular;  Laterality: Bilateral;  . PORTACATH PLACEMENT Left 06/05/2020   Procedure: INSERTION PORT-A-CATH;  Surgeon: Aviva Signs, MD;  Location: [facility];  Service: General;  Laterality: Left;  Marland Kitchen VEIN HARVEST Left 07/02/2015   Procedure: VEIN HARVEST - LEFT GREATER SAPPHENOUS VEIN;  Surgeon: Elam Dutch, MD;  Location: [place];  Service: Vascular;  Laterality: Left;     reports that he has been smoking cigarettes. He started smoking about 46 years ago. He has a 10.00 pack-year smoking history. He has never used smokeless tobacco. He  reports current alcohol use of about 3.0 standard drinks of alcohol per week. He reports current drug use. Drug: Marijuana.  Allergies  Allergen Reactions  . Tramadol     Felt sluggish and ineffective   . Darvocet [Propoxyphene N-Acetaminophen] Palpitations    Family History  Problem Relation Age of Onset  . Cancer Mother   . Hypertension Mother   . Bleeding Disorder Brother     Prior to Admission medications   Medication Sig Start Date End Date Taking? Authorizing Provider  apixaban (ELIQUIS) 5 MG TABS tablet Take 1 tablet (5 mg total) by mouth 2 (two) times daily. 01/18/21   Orson Eva, MD  aspirin EC 81 MG tablet Take 81 mg by mouth daily.    [provider]  DURVALUMAB IV Inject into the vein every 14 (fourteen) days. 09/12/20   [provider]  HYDROcodone-acetaminophen (NORCO) 10-325 MG tablet Take 1 tablet by mouth every 4 (four) hours as needed. Patient taking differently: Take 1 tablet by mouth  every 4 (four) hours as needed for severe pain. 01/20/21   [name], MD  isosorbide mononitrate (IMDUR) 30 MG 24 hr tablet TAKE 1/2 TABLET BY MOUTH ONCE DAILY. Patient taking differently: Take 15 mg by mouth daily. 12/04/15   Herminio Commons, MD  losartan (COZAAR) 25 MG tablet Take 0.5 tablets (12.5 mg total) by mouth daily. 01/18/21   Orson Eva, MD  metoprolol succinate (TOPROL-XL) 25 MG 24 hr tablet Take 1 tablet (25 mg total) by mouth daily. 01/18/21   Orson Eva, MD  NITROSTAT 0.4 MG SL tablet PLACE 1 TABLET UNDER THE TONGUE AS NEEDED FOR CHEST PAIN UP TO 3 DOSES Patient taking differently: Place 0.4 mg under the tongue every 5 (five) minutes as needed for chest pain. 10/06/16   Herminio Commons, MD  omeprazole (PRILOSEC) 40 MG capsule Take 40 mg by mouth daily. 07/20/16   [provider]  oxyCODONE-acetaminophen (PERCOCET) 10-325 MG tablet Take 1 tablet by mouth every 6 (six) hours as needed for pain. 02/10/21   [name], MD   prochlorperazine (COMPAZINE) 10 MG tablet Take 1 tablet (10 mg total) by mouth every 6 (six) hours as needed (Nausea or vomiting). 06/26/20 08/16/20  [name], MD    Physical Exam: Vitals:   02/15/21 0915 02/15/21 0927 02/15/21 1000 02/15/21 1015  BP: (!) 126/104 (!) 120/95 (!) 117/55 108/66  Pulse: 99 (!) 109 (!) 108 73  Resp: (!) 21 20 (!) 23 (!) 24  Temp:      TempSrc:      SpO2: 95% 98% 93% 94%  Weight:      Height:        Constitutional: NAD, calm, comfortable, appears groggy Vitals:   02/15/21 0915 02/15/21 0927 02/15/21 1000 02/15/21 1015  BP: (!) 126/104 (!) 120/95 (!) 117/55 108/66  Pulse: 99 (!) 109 (!) 108 73  Resp: (!) 21 20 (!) 23 (!) 24  Temp:      TempSrc:      SpO2: 95% 98% 93% 94%  Weight:      Height:       Eyes: lids and conjunctivae normal Neck: normal, supple Respiratory: clear to auscultation bilaterally. Normal respiratory effort. No accessory muscle use.  Port present to the left chest wall. Cardiovascular: Regular rate and rhythm, no murmurs. Abdomen: no tenderness, no distention. Bowel sounds positive.  Musculoskeletal:  No edema.  Patient was not very cooperative with motor or sensory testing Skin: no rashes, lesions, ulcers.  Psychiatric: Flat affect  Labs on Admission: I have personally reviewed following labs and imaging studies  CBC: Recent Labs  Lab 02/15/21 0833 02/15/21 0912  WBC 7.3  --   NEUTROABS 5.8  --   HGB 15.1 15.6  HCT 45.0 46.0  MCV 101.1*  --   PLT 134*  --    Basic Metabolic Panel: Recent Labs  Lab 02/15/21 0833 02/15/21 0912  NA 139 142  K 3.9 4.0  CL 106 105  CO2 27  --   GLUCOSE 117* 107*  BUN 11 9  CREATININE 1.21 1.20  CALCIUM 9.1  --    GFR: Estimated Creatinine Clearance: 65.5 mL/min (by C-G formula based on SCr of 1.2 mg/dL). Liver Function Tests: Recent Labs  Lab 02/15/21 0833  AST 28  ALT 25  ALKPHOS 118  BILITOT 0.9  PROT 7.1  ALBUMIN 3.1*   No results for input(s):  LIPASE, AMYLASE in the last 168 hours. No results for input(s): AMMONIA in the last 168  hours. Coagulation Profile: Recent Labs  Lab 02/15/21 0833  INR 1.3*   Cardiac Enzymes: No results for input(s): CKTOTAL, CKMB, CKMBINDEX, TROPONINI in the last 168 hours. BNP (last 3 results) No results for input(s): PROBNP in the last 8760 hours. HbA1C: No results for input(s): HGBA1C in the last 72 hours. CBG: No results for input(s): GLUCAP in the last 168 hours. Lipid Profile: No results for input(s): CHOL, HDL, LDLCALC, TRIG, CHOLHDL, LDLDIRECT in the last 72 hours. Thyroid Function Tests: No results for input(s): TSH, T4TOTAL, FREET4, T3FREE, THYROIDAB in the last 72 hours. Anemia Panel: No results for input(s): VITAMINB12, FOLATE, FERRITIN, TIBC, IRON, RETICCTPCT in the last 72 hours. Urine analysis:    Component Value Date/Time   COLORURINE YELLOW 02/15/2021 0955   APPEARANCEUR CLEAR 02/15/2021 0955   LABSPEC 1.016 02/15/2021 0955   PHURINE 5.0 02/15/2021 [place] 02/15/2021 0955   HGBUR NEGATIVE 02/15/2021 0955   BILIRUBINUR NEGATIVE 02/15/2021 0955   [place] 02/15/2021 0955   PROTEINUR NEGATIVE 02/15/2021 0955   UROBILINOGEN 1.0 06/24/2015 0837   NITRITE NEGATIVE 02/15/2021 0955   LEUKOCYTESUR NEGATIVE 02/15/2021 0955    Radiological Exams on Admission: CT HEAD CODE STROKE WO CONTRAST  Result Date: 02/15/2021 CLINICAL DATA:  Code stroke. 60 year old male with acute neurologic deficit. Colorectal cancer. EXAM: CT HEAD WITHOUT CONTRAST TECHNIQUE: Contiguous axial images were obtained from the base of the skull through the vertex without intravenous contrast. COMPARISON:  Brain MRI 05/20/2020. FINDINGS: Brain: Broad-based area of course pachymeningeal calcification along the left superior hemisphere convexity is stable from the MRI last year although more conspicuous on CT. No acute intracranial hemorrhage identified. No acute intracranial mass effect.  No ventriculomegaly. Widespread patchy white matter hypodensity, more pronounced in the right hemisphere. No cortically based acute infarct identified. No cortical encephalomalacia identified. Vascular: Calcified atherosclerosis at the skull base. No suspicious intracranial vascular hyperdensity. Skull: No acute or suspicious osseous lesion identified. Sinuses/Orbits: Visualized paranasal sinuses and mastoids are stable and well aerated. Other: Visualized orbits and scalp soft tissues are within normal limits. ASPECTS [facility] Stroke Program Early CT Score) Total score (0-10 with 10 being normal): 10 IMPRESSION: 1. No acute cortically based infarct or acute intracranial hemorrhage identified. ASPECTS 10. 2. Coarse chronic dural calcification along the left hemisphere, perhaps sequelae of a remote subdural hematoma or infection. Chronic white matter disease. Electronically Signed   By: Genevie Ann M.D.   On: 02/15/2021 09:07    EKG: Independently reviewed. 109 bpm atrial flutter.  Assessment/Plan Active Problems:   TIA (transient ischemic attack)    Diffuse weakness with concern for TIA/CVA -Transfer to [facility] for further evaluation by neurology -MRI brain and cervical spine as well as MRA head and neck -Continue Eliquis and start statin -Check lipid panel and hemoglobin A1c -PT/OT/speech evaluation -Recent 2D echocardiogram performed 4/22 with LVEF 30-35% -Monitor on telemetry  History of atrial fibrillation -Continue Eliquis for anticoagulation -Monitor on telemetry -Does not appear to have RVR currently, but is borderline elevated with heart rate -IV metoprolol as needed for heart rate control -No associated symptomatology present  History of hypertension/CAD/PAD -Hold home medications and treat for SBP greater than 180 -Plan to start statin  History of rectal adenocarcinoma/squamous cell lung cancer -Follow-up with oncology outpatient  Marijuana use -Noted on UDS  DVT  prophylaxis: Eliquis Code Status: Full Family Communication: Spoke with spouse on phone 5/21 Disposition Plan:Transfer to [facility] for Neurology evaluation and Brain MRI Consults called:TeleNeurology Dr. Cheral Marker Admission  status: Inpatient, Tele  Tamala Manzer D Manuella Ghazi DO [facility]  If 7PM-7AM, please contact night-coverage [email or web address]  02/15/2021, 10:40 AM

## 2021-02-15 NOTE — ED Notes (Signed)
Dr Laverta Baltimore in room to assess patient.

## 2021-02-15 NOTE — Consult Note (Addendum)
[facility] TeleNeurology Consult Services    Date of Service:  02/15/2021    Impression: Transient episode of diffuse weakness with possible asymmetry    Metrics: Last Known Well: 0630 Symptoms: As per HPI.  Patient is not a candidate for thrombolytic. NIHSS of 0.   Location of the provider: [facility]  Location of the patient: [facility]  mRS: 0  This consult was provided via telemedicine with 2-way video and audio communication. The patient/family was informed that care would be provided in this way and agreed to receive care in this manner.   ED Physician notified of diagnostic impression and management plan at: 9:51 AM.   Assessment: 60 year old male presenting after an episode of acute diffuse weakness at home  - Neurological exam is nonfocal. NIHSS = 0. Not a tPA or endovascular candidate.  - DDx for presentation includes cervical myelopathy, diffuse weakness secondary to transient worsening of his chronically low cardiac output (low EF on recent echocardiogram and also has atrial fibrillation/flutter), versus basilar artery TIA.     Recommendations: - MRA of head and neck - MRI brain to assess for possible stroke - MRI of cervical spine to assess for possible cervical myelopathy - Has had a TTE recently (see report conclusions in HPI) - Cardiology consult - PT/OT/Speech - Continue Eliquis - Permissive HTN using modified parameters given his low EF. Treat SBP if > 180. After 24 hours correct SBP by 15% per day to goal of 120-140. - Statin     ------------------------------------------------------------------------------   History of Present Illness: The patient is a 60 year old male with a PMHx of CAD, rectal cancer, DVT, atrial fibrillation, HTN and PAD who presented to the ED this morning after acute onset of diffuse weakness at home. LKN was the same as time of symptom onset: 0630. At that time the patient was washing  up and tried to use his deodorant when it dropped out of one of his hands. The patient tried to pick the deodorant up off the floor with his other hand and the item dropped again. The patient then went to his couch near which his legs gave out and he had a sliding fall to the floor without any trauma. He then tried to get up but could not immediately do so secondary to diffuse weakness. He was able to get back on the couch after about 5 minutes, then called his wife. When she saw him she feels he had a blank look on his face. He was stating that he did not feel well. His skin felt a little damp, per wife. EMS was called and CBG on scene was 168. The patient was in atrial fibrillation with RVR and HR of 83-157.   After arrival to the ED, the patient initially was with no deficits. A few minutes later he stated that he "felt off". Staff tested coordination which seemed abnormal on the right, a new change since their initial exam. Code Stroke was called and patient was taken for a STAT CT, which revealed no acute abnormality. Initial NIHSS per RN, after Code Stroke called, was 2. Subsequent NIHSS was 0.    CT head: 1. No acute cortically based infarct or acute intracranial hemorrhage identified. ASPECTS 10. 2. Coarse chronic dural calcification along the left hemisphere, perhaps sequelae of a remote subdural hematoma or infection. Chronic white matter disease.  Echocardiogram (4/22): 1. Left ventricular ejection fraction, by estimation, is 30 to 35%. The  left ventricle has moderately decreased function. The left ventricle  demonstrates global hypokinesis with some regional variation. Left  ventricular diastolic parameters are indeterminate in the setting of atrial fibrillation.  2. Right ventricular systolic function is moderately reduced. The right  ventricular size is normal. Tricuspid regurgitation signal is inadequate  for assessing PA pressure.  3. Left atrial size was mildly dilated.  4.  The mitral valve is grossly normal. Mild mitral valve regurgitation.  5. The aortic valve was not well visualized. There is mild calcification  of the aortic valve. Aortic valve regurgitation is not visualized.  6. The inferior vena cava is normal in size with greater than 50%  respiratory variability, suggesting right atrial pressure of 3 mmHg.   EKG:  Atrial flutter Prolonged QT interval   Past Medical History: Past Medical History:  Diagnosis Date  . Chronic lower back pain    a. Followed by pain management at [facility].  . Colon cancer [facility])    rectal cancer  . Coronary artery disease    a. 03/2013: abnl nuc -> LHC s/p DES to LCx, residual moderate disease in LAD (med rx unless refractory angina). b. Not on BB due to bradycardia.  Marland Kitchen DVT (deep venous thrombosis) ([place]) ~ 2013  . Dysrhythmia    AFib  . GERD (gastroesophageal reflux disease)   . H/O necrotising fasciitis   . History of blood transfusion    "once; after throwing up alot of blood" (04/17/2013)  . Hypertension   . LV dysfunction    a. EF 45% in 03/2013.  Marland Kitchen PAD (peripheral artery disease) ([place])    a. Occlusion of the right internal iliac artery, with significant atherosclerosis in the left internal iliac which was not amenable to reconstruction per notes from [place] in place 05/30/2020  . Pulmonary nodules 09/30/2014  . Rectal cancer ([place])   . Tobacco abuse       Past Surgical History: Past Surgical History:  Procedure Laterality Date  . ABDOMINAL SURGERY  1990's   'for stomach ulcers" (04/17/2013)  . COLECTOMY  2012   "for rectal cancer" (04/17/2013)  . COLONOSCOPY  2013   Dr. Cheryll Cockayne: colorectal anastomosis with ulcer and inflammation, benign biopsy  . COLONOSCOPY WITH PROPOFOL N/A 09/07/2016   Procedure: COLONOSCOPY WITH PROPOFOL;  Surgeon: Daneil Dolin, MD;  [facility];  Service: Endoscopy;  Laterality: N/A;  10:00 am Colonoscopy via rectum and ostomy  .  COLOSTOMY TAKEDOWN  2013  . CORONARY ANGIOPLASTY WITH STENT PLACEMENT  04/17/2013   "?1" (04/17/2013)  . FEMORAL-POPLITEAL BYPASS GRAFT Left 07/02/2015   Procedure: BYPASS GRAFT LEFT COMMON FEMORAL ARTERY TO LEFT ABOVE KNEE POPLITEAL ARTERY - USING LEFT GREATER SAPPHENOUS VEIN;  Surgeon: Elam Dutch, MD;  Location: [place];  Service: Vascular;  Laterality: Left;  . INCISION AND DRAINAGE ABSCESS Left 06/05/2016   Procedure: INCISION AND DRAINAGE ABSCESS;  Surgeon: Aviva Signs, MD;  Location: [facility];  Service: General;  Laterality: Left;  . INCISION AND DRAINAGE PERIRECTAL ABSCESS Left 06/07/2016   Procedure: IRRIGATION AND DEBRIDEMENT LEFT BUTTOCK ABSCESS;  Surgeon: Greer Pickerel, MD;  Location: [place];  Service: General;  Laterality: Left;  . INGUINAL HERNIA REPAIR Bilateral 1990's  . LAPAROSCOPIC PARTIAL COLECTOMY N/A 06/11/2016   Procedure: LAPAROSCOPIC  OPEN COLOSTOMY;  Surgeon: [name], MD;  Location: [place];  Service: General;  Laterality: N/A;  . LEFT HEART CATHETERIZATION WITH CORONARY ANGIOGRAM N/A 04/17/2013   Procedure: LEFT HEART  CATHETERIZATION WITH CORONARY ANGIOGRAM;  Surgeon: Peter M Martinique, MD;  Location: [facility] CATH LAB;  Service: Cardiovascular;  Laterality: N/A;  . PERCUTANEOUS STENT INTERVENTION  04/17/2013   Procedure: PERCUTANEOUS STENT INTERVENTION;  Surgeon: Peter M Martinique, MD;  Location: [facility] CATH LAB;  Service: Cardiovascular;;  . PERIPHERAL VASCULAR CATHETERIZATION N/A 06/14/2015   Procedure: Abdominal Aortogram;  Surgeon: Elam Dutch, MD;  Location: [place] CV LAB;  Service: Cardiovascular;  Laterality: N/A;  . PERIPHERAL VASCULAR CATHETERIZATION Bilateral 06/14/2015   Procedure: Lower Extremity Angiography;  Surgeon: Elam Dutch, MD;  Location: [place] CV LAB;  Service: Cardiovascular;  Laterality: Bilateral;  . PORTACATH PLACEMENT Left 06/05/2020   Procedure: INSERTION PORT-A-CATH;  Surgeon: Aviva Signs, MD;  Location: [facility];  Service: General;   Laterality: Left;  Marland Kitchen VEIN HARVEST Left 07/02/2015   Procedure: VEIN HARVEST - LEFT GREATER SAPPHENOUS VEIN;  Surgeon: Elam Dutch, MD;  Location: [place];  Service: Vascular;  Laterality: Left;       Medications:  No current facility-administered medications on file prior to encounter.   Current Outpatient Medications on File Prior to Encounter  Medication Sig Dispense Refill  . apixaban (ELIQUIS) 5 MG TABS tablet Take 1 tablet (5 mg total) by mouth 2 (two) times daily. 60 tablet 1  . aspirin EC 81 MG tablet Take 81 mg by mouth daily.    [name] Oris IV Inject into the vein every 14 (fourteen) days.    Marland Kitchen HYDROcodone-acetaminophen (NORCO) 10-325 MG tablet Take 1 tablet by mouth every 4 (four) hours as needed. (Patient taking differently: Take 1 tablet by mouth every 4 (four) hours as needed for severe pain.) 180 tablet 0  . isosorbide mononitrate (IMDUR) 30 MG 24 hr tablet TAKE 1/2 TABLET BY MOUTH ONCE DAILY. (Patient taking differently: Take 15 mg by mouth daily.) 15 tablet 3  . losartan (COZAAR) 25 MG tablet Take 0.5 tablets (12.5 mg total) by mouth daily. 30 tablet 1  . metoprolol succinate (TOPROL-XL) 25 MG 24 hr tablet Take 1 tablet (25 mg total) by mouth daily. 30 tablet 1  . NITROSTAT 0.4 MG SL tablet PLACE 1 TABLET UNDER THE TONGUE AS NEEDED FOR CHEST PAIN UP TO 3 DOSES (Patient taking differently: Place 0.4 mg under the tongue every 5 (five) minutes as needed for chest pain.) 25 tablet 3  . omeprazole (PRILOSEC) 40 MG capsule Take 40 mg by mouth daily.    Marland Kitchen oxyCODONE-acetaminophen (PERCOCET) 10-325 MG tablet Take 1 tablet by mouth every 6 (six) hours as needed for pain. 60 tablet 0  . [DISCONTINUED] prochlorperazine (COMPAZINE) 10 MG tablet Take 1 tablet (10 mg total) by mouth every 6 (six) hours as needed (Nausea or vomiting). 30 tablet 1         Social History: Drinks 3 can beer per week. Uses cannabis. Smokes tobacco with a 10 pack year history.    Family History:  Reviewed  in Epic   ROS: As per HPI    Anticoagulant use:  Eliquis   Antiplatelet use: ASA (has cardiac stent)   Examination:    BP (!) 117/55   Pulse (!) 108   Temp 97.7 F (36.5 C) (Oral)   Resp (!) 23   Ht 5\' 9"  (1.753 m)   Wt 71.2 kg   SpO2 93%   BMI 23.18 kg/m     1A: Level of Consciousness - 0 1B: Ask Month and Age - 0 1C: Blink Eyes & Squeeze Hands - 0 2:  Test Horizontal Extraocular Movements - 0 3: Test Visual Fields - 0 4: Test Facial Palsy (Use Grimace if Obtunded) - 0 5A: Test Left Arm Motor Drift - 0 5B: Test Right Arm Motor Drift - 0 6A: Test Left Leg Motor Drift - 0 6B: Test Right Leg Motor Drift - 0 7: Test Limb Ataxia (FNF/Heel-Shin) - 0 8: Test Sensation -  0 9: Test Language/Aphasia - 0 10: Test Dysarthria - Severe Dysarthria: 0 11: Test Extinction/Inattention - Extinction to bilateral simultaneous stimulation 0   NIHSS Score: 0   Pre-Morbid Modified Rankin Scale: 0     Patient/Family was informed the Neurology Consult would occur via TeleHealth consult by way of interactive audio and video telecommunications and consented to receiving care in this manner.   Patient is being evaluated for possible acute neurologic impairment and high pretest probability of imminent or life-threatening deterioration. I spent total of 40 minutes providing care to this patient, including time for face to face visit via telemedicine, review of medical records, imaging studies and discussion of findings with providers, the patient and/or family.   Electronically signed: Dr. Kerney Elbe

## 2021-02-15 NOTE — ED Notes (Signed)
Colostomy bag leaking and new colostomy bag obtained and fitted on patient at this time. Patient voices no complaints.

## 2021-02-15 NOTE — ED Notes (Signed)
Patient started to state "he felt off" while staff in room. Patient coordination on right side noted to be off to sign which is a new symptom. Patient coordination normal 10 minutes prior to event .

## 2021-02-15 NOTE — Plan of Care (Signed)
  Problem: Acute Rehab PT Goals(only PT should resolve) Goal: Pt Will Ambulate Flowsheets (Taken 02/15/2021 1601) Pt will Ambulate:  > 125 feet  with modified independence  with least restrictive assistive device Goal: Pt Will Go Up/Down Stairs Flowsheets (Taken 02/15/2021 1601) Pt will Go Up / Down Stairs:  3-5 stairs  with modified independence  with rail(s)   4:01 PM, 02/15/21 Josue Hector PT DPT  Physical Therapist with [facility]  [phone]

## 2021-02-15 NOTE — ED Provider Notes (Signed)
Emergency Department Provider Note   I have reviewed the triage vital signs and the nursing notes.   HISTORY  Chief Complaint Weakness   HPI Juan Wheeler is a 60 y.o. male with complicated past medical history reviewed below presents to the emergency department with acute onset generalized weakness and difficulty with coordination.  Last normal at 6 AM when he awoke.  He states he suddenly felt very weak all over and had difficulty using his deodorant.  He had a fall with possible head injury.  He is on Eliquis and has been taking this twice daily with his last dose being at 6 AM this morning.  He denies any fevers or chills.  No chest pain, shortness of breath, palpitations.  Denies any subjective numbness.  No vision changes. Denies subjective fever. EMS arrived to find the patient without focal deficits but a-fib with RVR. During triage, the patient seemed to suddenly lose his ability to write w/ his right hand according to bedside nurse.   Past Medical History:  Diagnosis Date  . Chronic lower back pain    a. Followed by pain management at [facility].  . Colon cancer [facility])    rectal cancer  . Coronary artery disease    a. 03/2013: abnl nuc -> LHC s/p DES to LCx, residual moderate disease in LAD (med rx unless refractory angina). b. Not on BB due to bradycardia.  Marland Kitchen DVT (deep venous thrombosis) ([place]) ~ 2013  . Dysrhythmia    AFib  . GERD (gastroesophageal reflux disease)   . H/O necrotising fasciitis   . History of blood transfusion    "once; after throwing up alot of blood" (04/17/2013)  . Hypertension   . LV dysfunction    a. EF 45% in 03/2013.  Marland Kitchen PAD (peripheral artery disease) ([place])    a. Occlusion of the right internal iliac artery, with significant atherosclerosis in the left internal iliac which was not amenable to reconstruction per notes from [place] in place 05/30/2020  . Pulmonary nodules 09/30/2014  . Rectal cancer ([place])   . Tobacco abuse      Patient Active Problem List   Diagnosis Date Noted  . TIA (transient ischemic attack) 02/15/2021  . Hypoalbuminemia due to protein-calorie malnutrition ([place]) 01/16/2021  . Elevated MCV 01/16/2021  . Atrial fibrillation with rapid ventricular response ([place])   . Malignant neoplasm of lung ([place])   . Pulmonary embolism ([place]) 01/15/2021  . Hepatic metastasis ([place]) 01/15/2021  . Elevated troponin I level 01/15/2021  . Pleural effusion on right 01/15/2021  . Neutropenia, drug-induced ([place]) 07/25/2020  . Port-A-Cath in place 05/30/2020  . Squamous cell lung cancer, right ([place]) 05/22/2020  . History of DVT (deep vein thrombosis) 07/08/2016  . Paroxysmal atrial fibrillation ([place]) 07/08/2016  . SOB (shortness of breath) 07/08/2016  . Sepsis secondary to UTI ([place]) 07/08/2016  . Hypokalemia   . Gastroesophageal reflux disease   . Coronary artery disease involving native coronary artery of native heart with angina pectoris ([place])   . AKI (acute kidney injury) ([place])   . Fournier's gangrene in male 06/05/2016  . Abscess 06/05/2016  . Sepsis ([place])   . Necrotizing fasciitis ([place])   . Central venous catheter in place   . Acute respiratory failure ([place])   . PAD (peripheral artery disease) ([place]) 07/02/2015  . Preoperative cardiovascular examination 06/12/2015  . Cardiomyopathy, ischemic 06/12/2015  . Pulmonary nodules 09/30/2014  . ASCVD (arteriosclerotic cardiovascular disease) 05/02/2013  . Unstable  angina ([place]) 04/18/2013  . Chest pain 03/29/2013  . Tobacco abuse 03/29/2013  . Chronic pain syndrome 11/09/2012  . Pain in joint, pelvic region and thigh 11/09/2012  . Neuralgia and neuritis 10/12/2012  . Atherosclerosis of native arteries of extremity with intermittent claudication ([place]) 08/19/2012  . Backache 03/24/2012  . Peripheral vascular disease ([place]) 12/04/2011  . Compression of vein 12/04/2011  . Heartburn 12/03/2011  . Personal history of digestive disease 11/19/2011  .  Depressive disorder 09/24/2011  . Dysuria 08/31/2011  . Impotence of organic origin 08/31/2011  . Urinary frequency 08/31/2011  . Constipation 06/05/2011  . Essential hypertension 06/05/2011  . Rectal cancer metastasized to lung [facility]) 12/02/2010    Past Surgical History:  Procedure Laterality Date  . ABDOMINAL SURGERY  1990's   'for stomach ulcers" (04/17/2013)  . COLECTOMY  2012   "for rectal cancer" (04/17/2013)  . COLONOSCOPY  2013   Dr. Cheryll Cockayne: colorectal anastomosis with ulcer and inflammation, benign biopsy  . COLONOSCOPY WITH PROPOFOL N/A 09/07/2016   Procedure: COLONOSCOPY WITH PROPOFOL;  Surgeon: Daneil Dolin, MD;  [facility];  Service: Endoscopy;  Laterality: N/A;  10:00 am Colonoscopy via rectum and ostomy  . COLOSTOMY TAKEDOWN  2013  . CORONARY ANGIOPLASTY WITH STENT PLACEMENT  04/17/2013   "?1" (04/17/2013)  . FEMORAL-POPLITEAL BYPASS GRAFT Left 07/02/2015   Procedure: BYPASS GRAFT LEFT COMMON FEMORAL ARTERY TO LEFT ABOVE KNEE POPLITEAL ARTERY - USING LEFT GREATER SAPPHENOUS VEIN;  Surgeon: Elam Dutch, MD;  Location: [place];  Service: Vascular;  Laterality: Left;  . INCISION AND DRAINAGE ABSCESS Left 06/05/2016   Procedure: INCISION AND DRAINAGE ABSCESS;  Surgeon: Aviva Signs, MD;  Location: [facility];  Service: General;  Laterality: Left;  . INCISION AND DRAINAGE PERIRECTAL ABSCESS Left 06/07/2016   Procedure: IRRIGATION AND DEBRIDEMENT LEFT BUTTOCK ABSCESS;  Surgeon: Greer Pickerel, MD;  Location: [place];  Service: General;  Laterality: Left;  . INGUINAL HERNIA REPAIR Bilateral 1990's  . LAPAROSCOPIC PARTIAL COLECTOMY N/A 06/11/2016   Procedure: LAPAROSCOPIC  OPEN COLOSTOMY;  Surgeon: [name], MD;  Location: [place];  Service: General;  Laterality: N/A;  . LEFT HEART CATHETERIZATION WITH CORONARY ANGIOGRAM N/A 04/17/2013   Procedure: LEFT HEART CATHETERIZATION WITH CORONARY ANGIOGRAM;  Surgeon: Peter M Martinique, MD;  Location: [facility] CATH LAB;  Service:  Cardiovascular;  Laterality: N/A;  . PERCUTANEOUS STENT INTERVENTION  04/17/2013   Procedure: PERCUTANEOUS STENT INTERVENTION;  Surgeon: Peter M Martinique, MD;  Location: [facility] CATH LAB;  Service: Cardiovascular;;  . PERIPHERAL VASCULAR CATHETERIZATION N/A 06/14/2015   Procedure: Abdominal Aortogram;  Surgeon: Elam Dutch, MD;  Location: [place] CV LAB;  Service: Cardiovascular;  Laterality: N/A;  . PERIPHERAL VASCULAR CATHETERIZATION Bilateral 06/14/2015   Procedure: Lower Extremity Angiography;  Surgeon: Elam Dutch, MD;  Location: [place] CV LAB;  Service: Cardiovascular;  Laterality: Bilateral;  . PORTACATH PLACEMENT Left 06/05/2020   Procedure: INSERTION PORT-A-CATH;  Surgeon: Aviva Signs, MD;  Location: [facility];  Service: General;  Laterality: Left;  Marland Kitchen VEIN HARVEST Left 07/02/2015   Procedure: VEIN HARVEST - LEFT GREATER SAPPHENOUS VEIN;  Surgeon: Elam Dutch, MD;  Location: [place];  Service: Vascular;  Laterality: Left;    Allergies Tramadol and Darvocet [propoxyphene n-acetaminophen]  Family History  Problem Relation Age of Onset  . Cancer Mother   . Hypertension Mother   . Bleeding Disorder Brother     Social History Social History   Tobacco Use  . Smoking status:  Light Tobacco Smoker    Packs/day: 0.25    Years: 40.00    Pack years: 10.00    Types: Cigarettes    Start date: 03/14/1974  . Smokeless tobacco: Never Used  . Tobacco comment: 5-6 per day 06/12/15  Vaping Use  . Vaping Use: Never used  Substance Use Topics  . Alcohol use: Yes    Alcohol/week: 3.0 standard drinks    Types: 3 Cans of beer per week  . Drug use: Yes    Types: Marijuana    Comment: 2 days ago     Review of Systems  Constitutional: No fever/chills. Positive generalized weakness.  Eyes: No visual changes. ENT: No sore throat. Cardiovascular: Denies chest pain. Respiratory: Denies shortness of breath. Gastrointestinal: No abdominal pain. No nausea, no vomiting.  No diarrhea.   No constipation. Genitourinary: Negative for dysuria. Musculoskeletal: Negative for back pain. Skin: Negative for rash. Neurological: Negative for headaches, focal weakness or numbness.  10-point ROS otherwise negative.  ____________________________________________   PHYSICAL EXAM:  VITAL SIGNS: ED Triage Vitals  Enc Vitals Group     BP 02/15/21 0814 120/89     Pulse Rate 02/15/21 0814 (!) 122     Resp 02/15/21 0814 19     Temp 02/15/21 0814 97.7 F (36.5 C)     Temp Source 02/15/21 0814 Oral     SpO2 02/15/21 0814 95 %     Weight 02/15/21 0815 157 lb (71.2 kg)     Height 02/15/21 0815 5\' 9"  (1.753 m)   Constitutional: Alert and oriented. Well appearing and in no acute distress. Eyes: Conjunctivae are normal. PERRL Head: Atraumatic. Nose: No congestion/rhinnorhea. Mouth/Throat: Mucous membranes are moist.  Neck: No stridor.   Cardiovascular: Irregularly irregular. Good peripheral circulation. Grossly normal heart sounds.   Respiratory: Normal respiratory effort.  No retractions. Lungs CTAB. Gastrointestinal: Soft and nontender. No distention.  Musculoskeletal: No lower extremity tenderness nor edema. No gross deformities of extremities. Neurologic:  Normal speech and language.  No facial asymmetry.  Globally decreased strength with no unilateral weakness.  He does have some difficulty with finger-to-nose testing mainly on the left.  He will touch his nose with his left hand and then move his right hand between his nose and finger despite multiple corrections and verbal cues.  No particular pronator drift in the upper or lower extremities.  Skin:  Skin is warm, dry and intact. No rash noted.   ____________________________________________   LABS (all labs ordered are listed, but only abnormal results are displayed)  Labs Reviewed  PROTIME-INR - Abnormal; Notable for the following components:      Result Value   Prothrombin Time 15.9 (*)    INR 1.3 (*)    All other  components within normal limits  APTT - Abnormal; Notable for the following components:   aPTT 45 (*)    All other components within normal limits  CBC - Abnormal; Notable for the following components:   MCV 101.1 (*)    RDW 15.9 (*)    Platelets 134 (*)    All other components within normal limits  DIFFERENTIAL - Abnormal; Notable for the following components:   Lymphs Abs 0.6 (*)    All other components within normal limits  COMPREHENSIVE METABOLIC PANEL - Abnormal; Notable for the following components:   Glucose, Bld 117 (*)    Albumin 3.1 (*)    All other components within normal limits  RAPID URINE DRUG SCREEN, HOSP PERFORMED - Abnormal; Notable for  the following components:   Tetrahydrocannabinol POSITIVE (*)    All other components within normal limits  BRAIN NATRIURETIC PEPTIDE - Abnormal; Notable for the following components:   B Natriuretic Peptide 535.0 (*)    All other components within normal limits  I-STAT CHEM 8, ED - Abnormal; Notable for the following components:   Glucose, Bld 107 (*)    All other components within normal limits  RESP PANEL BY RT-PCR (FLU A&B, COVID) ARPGX2  ETHANOL  URINALYSIS, ROUTINE W REFLEX MICROSCOPIC  TSH  AMMONIA   ____________________________________________  EKG   EKG Interpretation  Date/Time:  Saturday Feb 15 2021 08:08:01 EDT Ventricular Rate:  109 PR Interval:    QRS Duration: 90 QT Interval:  409 QTC Calculation: 546 R Axis:   62 Text Interpretation: Atrial flutter Prolonged QT interval Confirmed by Nanda Quinton [phone]) on 02/15/2021 8:11:05 AM       ____________________________________________  RADIOLOGY  CT HEAD CODE STROKE WO CONTRAST  Result Date: 02/15/2021 CLINICAL DATA:  Code stroke. 60 year old male with acute neurologic deficit. Colorectal cancer. EXAM: CT HEAD WITHOUT CONTRAST TECHNIQUE: Contiguous axial images were obtained from the base of the skull through the vertex without intravenous contrast.  COMPARISON:  Brain MRI 05/20/2020. FINDINGS: Brain: Broad-based area of course pachymeningeal calcification along the left superior hemisphere convexity is stable from the MRI last year although more conspicuous on CT. No acute intracranial hemorrhage identified. No acute intracranial mass effect. No ventriculomegaly. Widespread patchy white matter hypodensity, more pronounced in the right hemisphere. No cortically based acute infarct identified. No cortical encephalomalacia identified. Vascular: Calcified atherosclerosis at the skull base. No suspicious intracranial vascular hyperdensity. Skull: No acute or suspicious osseous lesion identified. Sinuses/Orbits: Visualized paranasal sinuses and mastoids are stable and well aerated. Other: Visualized orbits and scalp soft tissues are within normal limits. ASPECTS [facility] Stroke Program Early CT Score) Total score (0-10 with 10 being normal): 10 IMPRESSION: 1. No acute cortically based infarct or acute intracranial hemorrhage identified. ASPECTS 10. 2. Coarse chronic dural calcification along the left hemisphere, perhaps sequelae of a remote subdural hematoma or infection. Chronic white matter disease. Electronically Signed   By: Genevie Ann M.D.   On: 02/15/2021 09:07    ____________________________________________   PROCEDURES  Procedure(s) performed:   Procedures  None  ____________________________________________   INITIAL IMPRESSION / ASSESSMENT AND PLAN / ED COURSE  Pertinent labs & imaging results that were available during my care of the patient were reviewed by me and considered in my medical decision making (see chart for details).   Patient presents to the emergency department with generalized weakness which is acute onset this morning.  He is having some apparent coordination issues.  I do not appreciate any focal weakness or numbness.  He was attempting to sign his name upon checking into the emergency department his right hand was very  uncoordination and he was unable to sign his name.  On my assessment he has difficulty with coordinating his left hand with finger-to-nose testing. Have activated a CODE STROKE but patient is anticoagulated and so likely not a tPA candidate. Considering alternate etiologies for weakness as well anemia, metabolic encephalopathy, seizure, etc.   09:09 AM Spoke with Radiology regarding the CT head. No acute findings.   Reviewed case with Dr. Cheral Marker with Neurology. Agrees with CVA w/u but also considering other causes for symptoms such as cardiac or metabolic.   Discussed patient's case with [facility] to request admission. Patient and family (if present) updated with plan.  Care transferred to [facility] service.  I reviewed all nursing notes, vitals, pertinent old records, EKGs, labs, imaging (as available).  ____________________________________________  FINAL CLINICAL IMPRESSION(S) / ED DIAGNOSES  Final diagnoses:  Generalized weakness     MEDICATIONS GIVEN DURING THIS VISIT:  Medications  aspirin EC tablet 81 mg (81 mg Oral Not Given 02/15/21 1201)  HYDROcodone-acetaminophen (NORCO) 10-325 MG per tablet 1 tablet (1 tablet Oral Given 02/15/21 1212)  pantoprazole (PROTONIX) EC tablet 40 mg (40 mg Oral Given 02/15/21 1212)  apixaban (ELIQUIS) tablet 5 mg (5 mg Oral Not Given 02/15/21 1202)   stroke: mapping our early stages of recovery book (has no administration in time range)  0.9 %  sodium chloride infusion ( Intravenous New Bag/Given 02/15/21 1205)  metoprolol tartrate (LOPRESSOR) injection 5 mg (has no administration in time range)  simvastatin (ZOCOR) tablet 40 mg (40 mg Oral Not Given 02/15/21 1214)  sodium chloride 0.9 % bolus 500 mL (0 mLs Intravenous Stopped 02/15/21 1044)    Note:  This document was prepared using Dragon voice recognition software and may include unintentional dictation errors.  Nanda Quinton, MD, [facility] Emergency Medicine    Erlene Devita, [facility], MD 02/15/21 [phone]

## 2021-02-15 NOTE — ED Triage Notes (Signed)
Patient brought in via EMS from home. Al;ert and oriented. Airway patent. Patient c/o generalized weakness that started this morning suddenly. Denies any nausea, vomiting, fevers, dizziness, headache, or neurological deficits. Patient denies hitting head or LOC. Per patient still has generalized wekaness. Per EMS patients blood glucose 168. Paramedic reports patient in Af[place] RVR with rate 83-157. Patient does have hx of RVR.

## 2021-02-16 ENCOUNTER — Inpatient Hospital Stay (HOSPITAL_COMMUNITY): Payer: Medicare PPO

## 2021-02-16 DIAGNOSIS — I10 Essential (primary) hypertension: Secondary | ICD-10-CM | POA: Diagnosis not present

## 2021-02-16 LAB — CBC
HCT: 43.9 % (ref 39.0–52.0)
Hemoglobin: 14.9 g/dL (ref 13.0–17.0)
MCH: 33.8 pg (ref 26.0–34.0)
MCHC: 33.9 g/dL (ref 30.0–36.0)
MCV: 99.5 fL (ref 80.0–100.0)
Platelets: 123 10*3/uL — ABNORMAL LOW (ref 150–400)
RBC: 4.41 MIL/uL (ref 4.22–5.81)
RDW: 15.7 % — ABNORMAL HIGH (ref 11.5–15.5)
WBC: 6.2 10*3/uL (ref 4.0–10.5)
nRBC: 0 % (ref 0.0–0.2)

## 2021-02-16 LAB — BASIC METABOLIC PANEL
Anion gap: 5 (ref 5–15)
BUN: 7 mg/dL (ref 6–20)
CO2: 26 mmol/L (ref 22–32)
Calcium: 8.6 mg/dL — ABNORMAL LOW (ref 8.9–10.3)
Chloride: 106 mmol/L (ref 98–111)
Creatinine, Ser: 1.12 mg/dL (ref 0.61–1.24)
GFR, Estimated: >60 mL/min (ref >60)
Glucose, Bld: 88 mg/dL (ref 70–99)
Potassium: 4 mmol/L (ref 3.5–5.1)
Sodium: 137 mmol/L (ref 135–145)

## 2021-02-16 LAB — MAGNESIUM: Magnesium: 1.8 mg/dL (ref 1.7–2.4)

## 2021-02-16 LAB — HEMOGLOBIN A1C
Hgb A1c MFr Bld: 6.8 % — ABNORMAL HIGH (ref 4.8–5.6)
Mean Plasma Glucose: 148.46 mg/dL

## 2021-02-16 LAB — LIPID PANEL
Cholesterol: 158 mg/dL (ref 0–200)
HDL: 44 mg/dL (ref >40)
LDL Cholesterol: 99 mg/dL (ref 0–99)
Total CHOL/HDL Ratio: 3.6 RATIO
Triglycerides: 73 mg/dL (ref <150)
VLDL: 15 mg/dL (ref 0–40)

## 2021-02-16 MED ORDER — SODIUM CHLORIDE 0.9% FLUSH
10.0000 mL | INTRAVENOUS | Status: DC | PRN
Start: 2021-02-16 — End: 2021-02-21
  Administered 2021-02-20: 10 mL

## 2021-02-16 MED ORDER — METOPROLOL SUCCINATE ER 25 MG PO TB24
25.0000 mg | ORAL_TABLET | Freq: Every day | ORAL | Status: DC
  Administered 2021-02-16 – 2021-02-18 (×3): 25 mg via ORAL
  Filled 2021-02-16 (×3): qty 1

## 2021-02-16 NOTE — Discharge Instructions (Addendum)

## 2021-02-16 NOTE — Progress Notes (Signed)
PROGRESS NOTE    REASON HELZER  XBJ:[id number] DOB: Oct 22, 1960 DOA: 02/15/2021 PCP: [name], MD   Brief Narrative:  Juan Wheeler is a 60 y.o. male with medical history significant for squamous cell carcinoma of the lung, rectal adenocarcinoma, neuropathy, cardiomyopathy with LVEF 30-35%, bilateral PE on Eliquis, atrial fibrillation, and CAD/PAD who presented to the ED this morning after acute onset of diffuse weakness at home.  His last known well around 6:30 AM and symptoms began shortly after that.  He tried to use his urine with his right hand but it fell out of his hands and when he tried to pick it up from the floor with his other hand and he dropped it again.  His legs then became weak after he went to his couch.  He tried to get up but he could not do so on account of the weakness.  After few minutes of this he called his wife and he was noted to have a blank look on his face.  His wife called EMS and he was brought to the ED for further evaluation. CT of the head with no acute findings noted.  He has been evaluated by teleneurology with recommendations for brain and cervical spine MRI as well as MRA of the head and neck.  He was not noted to be a candidate for tPA due to his use of Eliquis.  He will also require PT/OT evaluation and permissive hypertension.  He is to continue on his Eliquis and statin.  He will need transfer to [facility] for imaging as well as neurology evaluation. UDS positive for marijuana.    Assessment & Plan:   Active Problems:   TIA (transient ischemic attack)   Acute CVA with profound bilateral weakness and ambulatory dysfunction, POA - MRI shows multifocal acute ischemia within both hemispheres -concerning for embolic origin, most likely A. fib given his history; occlusion of the left vertebral artery with distal reconstitution -Patient denies noncompliance with anticoagulation,continue Eliquis and start statin -Lipid panel essentially unremarkable;  A1c 5.9 -PT/OT/speech evaluation ongoing per protocol -Recent 2D echocardiogram performed 4/22 with LVEF 30-35% -Monitor on telemetry -A. fib as below, chronic  Afib, somewhat rate controlled -Continue Eliquis for anticoagulation -Likely the source of above stroke, patient denies noncompliance which is worrisome if true given stroke while on full anticoagulation -Resume home metoprolol, follow telemetry  History of hypertension/CAD/PAD -Permissive hypertension today, resume home medications per neurology -Plan to start statin  History of rectal adenocarcinoma/squamous cell lung cancer -Follow-up with oncology outpatient  Marijuana use -Noted on UDS  DVT prophylaxis: Eliquis Code Status: Full Family Communication: None present  Status is: Inpatient  Dispo: The patient is from: Home              Anticipated d/c is to: To be determined              Anticipated d/c date is: 48 to 72 hours              Patient currently not medically stable for discharge  Consultants:   Neurology  Procedures:   None  Antimicrobials:  None indicated  Subjective: No acute issues or events overnight, heart rate minimally uncontrolled this morning up to 130 with ambulation and exertion but otherwise controlled, patient denies chest pain shortness of breath headache fevers or chills.  Objective: Vitals:   02/15/21 1858 02/15/21 1927 02/15/21 2354 02/16/21 0314  BP: (!) 129/95 (!) 136/93 (!) 130/98 114/81  Pulse: 96 92  82 100  Resp: 20 18 19 19   Temp: 97.8 F (36.6 C) 97.6 F (36.4 C) 97.8 F (36.6 C) 98 F (36.7 C)  TempSrc: Oral Oral Oral Oral  SpO2: 97% 99% 97% 100%  Weight:      Height:        Intake/Output Summary (Last 24 hours) at 02/16/2021 0706 Last data filed at 02/16/2021 0314 Gross per 24 hour  Intake 500.71 ml  Output 150 ml  Net 350.71 ml   Filed Weights   02/15/21 0815  Weight: 71.2 kg    Examination:  General:  Pleasantly resting in bed, No acute  distress. HEENT:  Normocephalic atraumatic.  Sclerae nonicteric, noninjected.  Extraocular movements intact bilaterally. Neck:  Without mass or deformity.  Trachea is midline. Lungs:  Clear to auscultate bilaterally without rhonchi, wheeze, or rales. Heart:  Regular rate and rhythm.  Without murmurs, rubs, or gallops. Abdomen:  Soft, nontender, nondistended.  Without guarding or rebound. Extremities: Right upper extremity weakness, difficulty with fine motor skills Vascular:  Dorsalis pedis and posterior tibial pulses palpable bilaterally. Skin:  Warm and dry, no erythema, no ulcerations.   Data Reviewed: I have personally reviewed following labs and imaging studies  CBC: Recent Labs  Lab 02/15/21 0833 02/15/21 0912  WBC 7.3  --   NEUTROABS 5.8  --   HGB 15.1 15.6  HCT 45.0 46.0  MCV 101.1*  --   PLT 134*  --    Basic Metabolic Panel: Recent Labs  Lab 02/15/21 0833 02/15/21 0912  NA 139 142  K 3.9 4.0  CL 106 105  CO2 27  --   GLUCOSE 117* 107*  BUN 11 9  CREATININE 1.21 1.20  CALCIUM 9.1  --    GFR: Estimated Creatinine Clearance: 65.5 mL/min (by C-G formula based on SCr of 1.2 mg/dL). Liver Function Tests: Recent Labs  Lab 02/15/21 0833  AST 28  ALT 25  ALKPHOS 118  BILITOT 0.9  PROT 7.1  ALBUMIN 3.1*   No results for input(s): LIPASE, AMYLASE in the last 168 hours. Recent Labs  Lab 02/15/21 1023  AMMONIA 12   Coagulation Profile: Recent Labs  Lab 02/15/21 0833  INR 1.3*   Cardiac Enzymes: No results for input(s): CKTOTAL, CKMB, CKMBINDEX, TROPONINI in the last 168 hours. BNP (last 3 results) No results for input(s): PROBNP in the last 8760 hours. HbA1C: No results for input(s): HGBA1C in the last 72 hours. CBG: No results for input(s): GLUCAP in the last 168 hours. Lipid Profile: Recent Labs    02/16/21 0445  CHOL 158  HDL 44  LDLCALC 99  TRIG 73  CHOLHDL 3.6   Thyroid Function Tests: Recent Labs    02/15/21 1023  TSH 0.979    Anemia Panel: No results for input(s): VITAMINB12, FOLATE, FERRITIN, TIBC, IRON, RETICCTPCT in the last 72 hours. Sepsis Labs: No results for input(s): PROCALCITON, LATICACIDVEN in the last 168 hours.  Recent Results (from the past 240 hour(s))  Resp Panel by RT-PCR (Flu A&B, Covid) Nasopharyngeal Swab     Status: None   Collection Time: 02/15/21  8:41 AM   Specimen: Nasopharyngeal Swab; Nasopharyngeal(NP) swabs in vial transport medium  Result Value Ref Range Status   SARS Coronavirus 2 by RT PCR NEGATIVE NEGATIVE Final    Comment: (NOTE) SARS-CoV-2 target nucleic acids are NOT DETECTED.  The SARS-CoV-2 RNA is generally detectable in upper respiratory specimens during the acute phase of infection. The lowest concentration of SARS-CoV-2 viral copies this  assay can detect is 138 copies/mL. A negative result does not preclude SARS-Cov-2 infection and should not be used as the sole basis for treatment or other patient management decisions. A negative result may occur with  improper specimen collection/handling, submission of specimen other than nasopharyngeal swab, presence of viral mutation(s) within the areas targeted by this assay, and inadequate number of viral copies(<138 copies/mL). A negative result must be combined with clinical observations, patient history, and epidemiological information. The expected result is Negative.  Fact Sheet for Patients:  EntrepreneurPulse.com.au  Fact Sheet for Healthcare Providers:  IncredibleEmployment.be  This test is no t yet approved or cleared by the [place] FDA and  has been authorized for detection and/or diagnosis of SARS-CoV-2 by FDA under an Emergency Use Authorization (EUA). This EUA will remain  in effect (meaning this test can be used) for the duration of the COVID-19 declaration under Section 564(b)(1) of the Act, 21 U.S.C.section 360bbb-3(b)(1), unless the authorization is terminated   or revoked sooner.       Influenza A by PCR NEGATIVE NEGATIVE Final   Influenza B by PCR NEGATIVE NEGATIVE Final    Comment: (NOTE) The Xpert Xpress SARS-CoV-2/FLU/RSV plus assay is intended as an aid in the diagnosis of influenza from Nasopharyngeal swab specimens and should not be used as a sole basis for treatment. Nasal washings and aspirates are unacceptable for Xpert Xpress SARS-CoV-2/FLU/RSV testing.  Fact Sheet for Patients: EntrepreneurPulse.com.au  Fact Sheet for Healthcare Providers: IncredibleEmployment.be  This test is not yet approved or cleared by the [place] FDA and has been authorized for detection and/or diagnosis of SARS-CoV-2 by FDA under an Emergency Use Authorization (EUA). This EUA will remain in effect (meaning this test can be used) for the duration of the COVID-19 declaration under Section 564(b)(1) of the Act, 21 U.S.C. section 360bbb-3(b)(1), unless the authorization is terminated or revoked.  Performed at [facility], [address] [place], [place] [address]          Radiology Studies: MR ANGIO HEAD WO CONTRAST  Result Date: 02/15/2021 CLINICAL DATA:  Squamous cell carcinoma of the tongue. Acute neurologic deficit. EXAM: MRI HEAD WITHOUT CONTRAST MRA HEAD WITHOUT CONTRAST MRA OF THE NECK WITHOUT AND WITH CONTRAST TECHNIQUE: Multiplanar, multi-echo pulse sequences of the brain and surrounding structures were acquired without intravenous contrast. Angiographic images of the Circle of Willis were acquired using MRA technique without intravenous contrast. Angiographic images of the neck were acquired using MRA technique without and with intravenous contrast. Carotid stenosis measurements (when applicable) are obtained utilizing NASCET criteria, using the distal internal carotid diameter as the denominator. CONTRAST:  66mL GADAVIST GADOBUTROL 1 MMOL/ML IV SOLN COMPARISON:  No pertinent prior exam. FINDINGS: MR  HEAD FINDINGS Brain: There is multifocal abnormal diffusion restriction within both hemispheres, left-greater-than-right. The largest area of abnormality is at the posterior left insula. There are other lesions in the right frontal lobe, both parietal lobes and both occipital lobes. Focus of chronic microhemorrhage in the left frontal operculum. There is multifocal hyperintense T2-weighted signal within the white matter. Generalized volume loss without a clear lobar predilection. The midline structures are normal. Vascular: Major flow voids are preserved. Skull and upper cervical spine: Normal calvarium and skull base. Visualized upper cervical spine and soft tissues are normal. Sinuses/Orbits:No paranasal sinus fluid levels or advanced mucosal thickening. No mastoid or middle ear effusion. Normal orbits. MRA HEAD FINDINGS POSTERIOR CIRCULATION: --Vertebral arteries: Normal --Inferior cerebellar arteries: Normal. --Basilar artery: Normal. --Superior cerebellar arteries: Normal. --Posterior  cerebral arteries: Normal. ANTERIOR CIRCULATION: --Intracranial internal carotid arteries: Normal. --Anterior cerebral arteries (ACA): Normal. --Middle cerebral arteries (MCA): Normal. ANATOMIC VARIANTS: None MRA NECK FINDINGS Aortic arch: Normal 3 vessel branching pattern. Right carotid system: No stenosis or occlusion Left carotid system: No stenosis or occlusion Vertebral arteries: The left vertebral artery is occluded. The distal portion is reconstituted, likely due to flow across the vertebrobasilar confluence. Other: None. IMPRESSION: 1. Multifocal acute ischemia within both hemispheres, left-greater-than-right, and multiple vascular territories. The largest area of abnormality is at the posterior left insula. The pattern is most suggestive of an embolic process, particularly in a patient with atrial fibrillation. 2. Occlusion of the left vertebral artery with distal reconstitution due to flow across the vertebrobasilar  confluence. 3. Normal intracranial MRA Electronically Signed   By: Ulyses Jarred M.D.   On: 02/15/2021 23:44   MR ANGIO NECK W WO CONTRAST  Result Date: 02/15/2021 CLINICAL DATA:  Squamous cell carcinoma of the tongue. Acute neurologic deficit. EXAM: MRI HEAD WITHOUT CONTRAST MRA HEAD WITHOUT CONTRAST MRA OF THE NECK WITHOUT AND WITH CONTRAST TECHNIQUE: Multiplanar, multi-echo pulse sequences of the brain and surrounding structures were acquired without intravenous contrast. Angiographic images of the Circle of Willis were acquired using MRA technique without intravenous contrast. Angiographic images of the neck were acquired using MRA technique without and with intravenous contrast. Carotid stenosis measurements (when applicable) are obtained utilizing NASCET criteria, using the distal internal carotid diameter as the denominator. CONTRAST:  46mL GADAVIST GADOBUTROL 1 MMOL/ML IV SOLN COMPARISON:  No pertinent prior exam. FINDINGS: MR HEAD FINDINGS Brain: There is multifocal abnormal diffusion restriction within both hemispheres, left-greater-than-right. The largest area of abnormality is at the posterior left insula. There are other lesions in the right frontal lobe, both parietal lobes and both occipital lobes. Focus of chronic microhemorrhage in the left frontal operculum. There is multifocal hyperintense T2-weighted signal within the white matter. Generalized volume loss without a clear lobar predilection. The midline structures are normal. Vascular: Major flow voids are preserved. Skull and upper cervical spine: Normal calvarium and skull base. Visualized upper cervical spine and soft tissues are normal. Sinuses/Orbits:No paranasal sinus fluid levels or advanced mucosal thickening. No mastoid or middle ear effusion. Normal orbits. MRA HEAD FINDINGS POSTERIOR CIRCULATION: --Vertebral arteries: Normal --Inferior cerebellar arteries: Normal. --Basilar artery: Normal. --Superior cerebellar arteries: Normal.  --Posterior cerebral arteries: Normal. ANTERIOR CIRCULATION: --Intracranial internal carotid arteries: Normal. --Anterior cerebral arteries (ACA): Normal. --Middle cerebral arteries (MCA): Normal. ANATOMIC VARIANTS: None MRA NECK FINDINGS Aortic arch: Normal 3 vessel branching pattern. Right carotid system: No stenosis or occlusion Left carotid system: No stenosis or occlusion Vertebral arteries: The left vertebral artery is occluded. The distal portion is reconstituted, likely due to flow across the vertebrobasilar confluence. Other: None. IMPRESSION: 1. Multifocal acute ischemia within both hemispheres, left-greater-than-right, and multiple vascular territories. The largest area of abnormality is at the posterior left insula. The pattern is most suggestive of an embolic process, particularly in a patient with atrial fibrillation. 2. Occlusion of the left vertebral artery with distal reconstitution due to flow across the vertebrobasilar confluence. 3. Normal intracranial MRA Electronically Signed   By: Ulyses Jarred M.D.   On: 02/15/2021 23:44   MR BRAIN WO CONTRAST  Result Date: 02/15/2021 CLINICAL DATA:  Squamous cell carcinoma of the tongue. Acute neurologic deficit. EXAM: MRI HEAD WITHOUT CONTRAST MRA HEAD WITHOUT CONTRAST MRA OF THE NECK WITHOUT AND WITH CONTRAST TECHNIQUE: Multiplanar, multi-echo pulse sequences of the brain and surrounding structures were  acquired without intravenous contrast. Angiographic images of the Circle of Willis were acquired using MRA technique without intravenous contrast. Angiographic images of the neck were acquired using MRA technique without and with intravenous contrast. Carotid stenosis measurements (when applicable) are obtained utilizing NASCET criteria, using the distal internal carotid diameter as the denominator. CONTRAST:  76mL GADAVIST GADOBUTROL 1 MMOL/ML IV SOLN COMPARISON:  No pertinent prior exam. FINDINGS: MR HEAD FINDINGS Brain: There is multifocal abnormal  diffusion restriction within both hemispheres, left-greater-than-right. The largest area of abnormality is at the posterior left insula. There are other lesions in the right frontal lobe, both parietal lobes and both occipital lobes. Focus of chronic microhemorrhage in the left frontal operculum. There is multifocal hyperintense T2-weighted signal within the white matter. Generalized volume loss without a clear lobar predilection. The midline structures are normal. Vascular: Major flow voids are preserved. Skull and upper cervical spine: Normal calvarium and skull base. Visualized upper cervical spine and soft tissues are normal. Sinuses/Orbits:No paranasal sinus fluid levels or advanced mucosal thickening. No mastoid or middle ear effusion. Normal orbits. MRA HEAD FINDINGS POSTERIOR CIRCULATION: --Vertebral arteries: Normal --Inferior cerebellar arteries: Normal. --Basilar artery: Normal. --Superior cerebellar arteries: Normal. --Posterior cerebral arteries: Normal. ANTERIOR CIRCULATION: --Intracranial internal carotid arteries: Normal. --Anterior cerebral arteries (ACA): Normal. --Middle cerebral arteries (MCA): Normal. ANATOMIC VARIANTS: None MRA NECK FINDINGS Aortic arch: Normal 3 vessel branching pattern. Right carotid system: No stenosis or occlusion Left carotid system: No stenosis or occlusion Vertebral arteries: The left vertebral artery is occluded. The distal portion is reconstituted, likely due to flow across the vertebrobasilar confluence. Other: None. IMPRESSION: 1. Multifocal acute ischemia within both hemispheres, left-greater-than-right, and multiple vascular territories. The largest area of abnormality is at the posterior left insula. The pattern is most suggestive of an embolic process, particularly in a patient with atrial fibrillation. 2. Occlusion of the left vertebral artery with distal reconstitution due to flow across the vertebrobasilar confluence. 3. Normal intracranial MRA Electronically  Signed   By: Ulyses Jarred M.D.   On: 02/15/2021 23:44   CT HEAD CODE STROKE WO CONTRAST  Result Date: 02/15/2021 CLINICAL DATA:  Code stroke. 60 year old male with acute neurologic deficit. Colorectal cancer. EXAM: CT HEAD WITHOUT CONTRAST TECHNIQUE: Contiguous axial images were obtained from the base of the skull through the vertex without intravenous contrast. COMPARISON:  Brain MRI 05/20/2020. FINDINGS: Brain: Broad-based area of course pachymeningeal calcification along the left superior hemisphere convexity is stable from the MRI last year although more conspicuous on CT. No acute intracranial hemorrhage identified. No acute intracranial mass effect. No ventriculomegaly. Widespread patchy white matter hypodensity, more pronounced in the right hemisphere. No cortically based acute infarct identified. No cortical encephalomalacia identified. Vascular: Calcified atherosclerosis at the skull base. No suspicious intracranial vascular hyperdensity. Skull: No acute or suspicious osseous lesion identified. Sinuses/Orbits: Visualized paranasal sinuses and mastoids are stable and well aerated. Other: Visualized orbits and scalp soft tissues are within normal limits. ASPECTS [facility] Stroke Program Early CT Score) Total score (0-10 with 10 being normal): 10 IMPRESSION: 1. No acute cortically based infarct or acute intracranial hemorrhage identified. ASPECTS 10. 2. Coarse chronic dural calcification along the left hemisphere, perhaps sequelae of a remote subdural hematoma or infection. Chronic white matter disease. Electronically Signed   By: Genevie Ann M.D.   On: 02/15/2021 09:07   Scheduled Meds: . apixaban  5 mg Oral BID  . aspirin EC  81 mg Oral Daily  . Chlorhexidine Gluconate Cloth  6 each Topical Daily  .  pantoprazole  40 mg Oral Daily  . simvastatin  40 mg Oral q1800   Continuous Infusions:   LOS: 1 day   Time spent: 72min  Chun Sellen C Deyonna Fitzsimmons, DO [facility]  If 7PM-7AM, please contact  night-coverage [email or web address]  02/16/2021, 7:06 AM

## 2021-02-16 NOTE — Evaluation (Signed)
Occupational Therapy Evaluation Patient Details Name: Juan Wheeler MRN: [id number] DOB: Apr 13, 1961 Today's Date: 02/16/2021    History of Present Illness 60 y.o. male presenting to ED on 5/21 with acute onset of diffuse weakness. MRI (+) multifocal acute ischemia within both hemispheres (L >R) with largest area at posterior L insula suggestive of embolic process in setting of A-fib. Patient also with occlusion of L vertebral artery. PMHx significant for lung cancer, rectal cancer, neuropathy, cardiomyopathy, A-fib and CAD/PAD.   Clinical Impression   PTA patient was living with his spouse in a private residence and was grossly I with ADLs/IADLs. Patient reports shared responsibility with wife for cooking/cleaning and reports managing home meds and colostomy with I. Patient currently functioning below baseline demonstrating observed ADLs including managing colostomy with Min A. Session limited to bed level only secondary to elevated HR at rest. Verbal and written education provided on RUE NMR. Patient would benefit from continued education. Patient also limited by deficits listed below including RUE weakness, ataxia, and low frustration tolerance and would benefit from continued acute OT services in prep for safe d/c home. Patient with preference for HHOT prior to transition to outpatient neuro.      Follow Up Recommendations  Home health OT;Supervision/Assistance - 24 hour (Initially)    Equipment Recommendations  3 in 1 bedside commode    Recommendations for Other Services       Precautions / Restrictions Precautions Precautions: Fall Precaution Comments: Monitor HR Restrictions Weight Bearing Restrictions: No      Mobility Bed Mobility Overal bed mobility: Modified Independent             General bed mobility comments: Patient able to complete supine scoot toward [facility] with supervision A for safety and use of RUE and bilateral LE. Did not attempt to use RUE.     Transfers Overall transfer level: Modified independent Equipment used: None             General transfer comment: Unable to progress beyond bed level this date 2/2 elevated HR at rest.    Balance Overall balance assessment: Modified Independent Sitting-balance support: Bilateral upper extremity supported;Feet supported Sitting balance-Leahy Scale: Good Sitting balance - Comments: seated at EOB   Standing balance support: No upper extremity supported Standing balance-Leahy Scale: Good                             ADL either performed or assessed with clinical judgement   ADL Overall ADL's : Needs assistance/impaired                                       General ADL Comments: Evaluation limited 2/2 elevated HR. Will continue to assess.     Vision Baseline Vision/History: No visual deficits Patient Visual Report: No change from baseline Vision Assessment?: No apparent visual deficits     Perception     Praxis      Pertinent Vitals/Pain Pain Assessment: Faces Faces Pain Scale: Hurts little more Pain Location: Generalized with movement Pain Descriptors / Indicators: Aching;Sore Pain Intervention(s): Limited activity within patient's tolerance;Monitored during session;Repositioned     Hand Dominance Right   Extremity/Trunk Assessment Upper Extremity Assessment Upper Extremity Assessment: RUE deficits/detail RUE Deficits / Details: Patient with "floppy wrist". Able to flex/abduct shoulder, and flex/extend elbow. Decreased extension of digits and wrist. Able to abduct thumb in  gravity minimized plane. RUE Sensation: decreased light touch;decreased proprioception RUE Coordination: decreased fine motor;decreased gross motor   Lower Extremity Assessment Lower Extremity Assessment: Defer to PT evaluation       Communication Communication Communication: Expressive difficulties;HOH (Slurred speech and word finding deficits)   Cognition  Arousal/Alertness: Awake/alert Behavior During Therapy: WFL for tasks assessed/performed Overall Cognitive Status: No family/caregiver present to determine baseline cognitive functioning                                 General Comments: Patient A&Ox4 with increased time to recall month/yr. Self corrected month after initially stating April.   General Comments  RN reports patient with HR in 150's with mild activity this a.m. HR 110's at rest and Max HR 115bpm with BUE MMT with bed in chair position. Limited evaluation to bed level only this date. BP and SpO2 (on 2L O2 via [place]) stable throughout.    Exercises Exercises: Hand activities;General Upper Extremity General Exercises - Upper Extremity Shoulder Flexion: AROM;Right;10 reps Elbow Flexion: AROM;Right;10 reps Elbow Extension: AROM;Right;10 reps Wrist Flexion: AROM;Right;10 reps Wrist Extension: AROM;Right;10 reps Digit Composite Flexion: AROM;Right;10 reps Composite Extension: AROM;Right;10 reps General Exercises - Lower Extremity Ankle Circles/Pumps: Both;10 reps;Seated Long Arc Quad: Left;10 reps;Seated Hip Flexion/Marching: Both;Seated;Standing;10 reps Hand Exercises Forearm Supination: AROM;10 reps;Right Forearm Pronation: AROM;Right;10 reps Digit Lifts: AAROM;Right;10 reps Digit Lifts Limitations: Assist from LUE. Thumb Abduction: AROM;Right;10 reps Opposition: AAROM;Right;10 reps Opposition Limitations: Unable   Shoulder Instructions      Home Living Family/patient expects to be discharged to:: Private residence Living Arrangements: Spouse/significant other Available Help at Discharge: Family;Available PRN/intermittently (Wife works 5 days/wk) Type of Home: House Home Access: Stairs to enter Technical brewer of Steps: 3-4 Entrance Stairs-Rails: Right;Left Home Layout: One level     Bathroom Shower/Tub: Corporate investment banker: Standard Bathroom Accessibility: Yes   Home  Equipment: Cane - single point;Hand held shower head          Prior Functioning/Environment Level of Independence: Independent        Comments: I with ADLs/IADLs without AD; drives; shared responsibility with wife for IADLs        OT Problem List: Decreased strength;Decreased range of motion;Decreased activity tolerance;Impaired balance (sitting and/or standing);Decreased coordination;Impaired sensation;Impaired tone;Impaired UE functional use      OT Treatment/Interventions: Self-care/ADL training;Therapeutic exercise;Neuromuscular education;Energy conservation;DME and/or AE instruction;Therapeutic activities;Cognitive remediation/compensation;Balance training;Patient/family education    OT Goals(Current goals can be found in the care plan section) Acute Rehab OT Goals Patient Stated Goal: Return home with [facility] therapies OT Goal Formulation: With patient Time For Goal Achievement: 03/02/21 Potential to Achieve Goals: Good ADL Goals Pt Will Perform Eating: sitting;with modified independence;with adaptive utensils Pt Will Perform Grooming: with modified independence;standing Pt Will Perform Upper Body Dressing: with modified independence;sitting Pt Will Perform Lower Body Dressing: with modified independence;sit to/from stand Pt Will Transfer to Toilet: with modified independence;ambulating Pt Will Perform Toileting - Clothing Manipulation and hygiene: with modified independence;sit to/from stand Additional ADL Goal #1: Patient will utilize affected RUE at gross assist level during ADLs with no more than minimal verbal cues.  OT Frequency: Min 3X/week   Barriers to D/C: Decreased caregiver support  Wife works 5 days/wk       Co-evaluation              AM-PAC OT "6 Clicks" Daily Activity     Outcome Measure Help from another person  eating meals?: A Little Help from another person taking care of personal grooming?: A Lot Help from another person toileting, which includes  using toliet, bedpan, or urinal?: A Lot Help from another person bathing (including washing, rinsing, drying)?: A Lot Help from another person to put on and taking off regular upper body clothing?: A Little Help from another person to put on and taking off regular lower body clothing?: A Lot 6 Click Score: 14   End of Session Nurse Communication: Mobility status;Other (comment) (HR)  Activity Tolerance: Treatment limited secondary to medical complications (Comment) (Elevated HR at rest) Patient left: in bed;with call bell/phone within reach;with bed alarm set  OT Visit Diagnosis: Muscle weakness (generalized) (M62.81);Ataxia, unspecified (R27.0);Hemiplegia and hemiparesis Hemiplegia - Right/Left: Right Hemiplegia - dominant/non-dominant: Dominant Hemiplegia - caused by: Cerebral infarction                Time: 7169-6789 OT Time Calculation (min): 30 min Charges:  OT General Charges $OT Visit: 1 Visit OT Evaluation $OT Eval Moderate Complexity: 1 Mod OT Treatments $Neuromuscular Re-education: 8-22 mins  Destyn Parfitt H. OTR/L Supplemental OT, Department of rehab services [phone]  Yan Okray R H. 02/16/2021, 10:40 AM

## 2021-02-16 NOTE — Plan of Care (Signed)

## 2021-02-16 NOTE — Progress Notes (Signed)
Pt's HR was maintaining 130's-150's while eating. IV metoprolol was given given at 7am for uncontrolled afib in the 150's. HR seems to go up with exertion. MD was notified and is okay if HR stays under 140.

## 2021-02-17 ENCOUNTER — Ambulatory Visit (HOSPITAL_COMMUNITY): Payer: Medicare PPO | Admitting: Hematology

## 2021-02-17 ENCOUNTER — Inpatient Hospital Stay (HOSPITAL_COMMUNITY): Payer: Medicare PPO

## 2021-02-17 DIAGNOSIS — I10 Essential (primary) hypertension: Secondary | ICD-10-CM | POA: Diagnosis not present

## 2021-02-17 DIAGNOSIS — C20 Malignant neoplasm of rectum: Secondary | ICD-10-CM

## 2021-02-17 DIAGNOSIS — C3491 Malignant neoplasm of unspecified part of right bronchus or lung: Secondary | ICD-10-CM

## 2021-02-17 LAB — BASIC METABOLIC PANEL
Anion gap: 6 (ref 5–15)
BUN: 9 mg/dL (ref 6–20)
CO2: 26 mmol/L (ref 22–32)
Calcium: 8.7 mg/dL — ABNORMAL LOW (ref 8.9–10.3)
Chloride: 104 mmol/L (ref 98–111)
Creatinine, Ser: 1.18 mg/dL (ref 0.61–1.24)
GFR, Estimated: >60 mL/min (ref >60)
Glucose, Bld: 94 mg/dL (ref 70–99)
Potassium: 4.2 mmol/L (ref 3.5–5.1)
Sodium: 136 mmol/L (ref 135–145)

## 2021-02-17 LAB — CBC
HCT: 45.1 % (ref 39.0–52.0)
Hemoglobin: 15.2 g/dL (ref 13.0–17.0)
MCH: 33.2 pg (ref 26.0–34.0)
MCHC: 33.7 g/dL (ref 30.0–36.0)
MCV: 98.5 fL (ref 80.0–100.0)
Platelets: 125 10*3/uL — ABNORMAL LOW (ref 150–400)
RBC: 4.58 MIL/uL (ref 4.22–5.81)
RDW: 15.5 % (ref 11.5–15.5)
WBC: 5.8 10*3/uL (ref 4.0–10.5)
nRBC: 0 % (ref 0.0–0.2)

## 2021-02-17 NOTE — Progress Notes (Signed)
I did call RR nurse Hella and discussed with her what was going on, just to give them a heads up, in case there are any further changes.  She said that she would come lay eyes on him and she does agree with what Dr Avon Gully is doing, as per stroke worsening protocol.

## 2021-02-17 NOTE — TOC CAGE-AID Note (Signed)
Transition of Care [facility]) - CAGE-AID Screening   Patient Details  Name: CHAIM GATLEY MRN: [id number] Date of Birth: 05-14-1961  Transition of Care [facility]) CM/SW Contact:    Pollie Friar, RN Phone Number: 02/17/2021, 11:20 AM   Clinical Narrative: Pt states he only has a couple drinks a week with friends. He denies need for inpatient/ outpatient alcohol counseling resources.   CAGE-AID Screening:    Have You Ever Felt You Ought to Cut Down on Your Drinking or Drug Use?: No Have People Annoyed You By Critizing Your Drinking Or Drug Use?: No Have You Felt Bad Or Guilty About Your Drinking Or Drug Use?: No Have You Ever Had a Drink or Used Drugs First Thing In The Morning to Steady Your Nerves or to Get Rid of a Hangover?: No CAGE-AID Score: 0  Substance Abuse Education Offered: Yes (patient denied need for resources)

## 2021-02-17 NOTE — Evaluation (Signed)
Physical Therapy Re-Evaluation Patient Details Name: Juan Wheeler MRN: [id number] DOB: 06-Mar-1961 Today's Date: 02/17/2021   History of Present Illness  60 y.o. male presenting to ED on 5/21 with acute onset of diffuse weakness. MRI (+) multifocal acute ischemia within both hemispheres (L >R) with largest area at posterior L insula suggestive of embolic process in setting of A-fib. Patient also with occlusion of L vertebral artery. PMHx significant for lung cancer, rectal cancer, neuropathy, cardiomyopathy, A-fib and CAD/PAD.  Clinical Impression   This PT spoke with the RN prior to seeing this pt, RN Davy Pique states pt has been waxing and waning with neuro s/s over his hospital course and is worse today, MD aware of this and of PT assessment at the time because MD was notified by RN via telephone. RN states appropriate to proceed with PT assessment of mobility to determine functional change. At that time stat CT order not placed. Pt now presenting with severe R-sided weakness with inattention, impaired balance, max assist for bed mobility and static standing, slurred speech, and poor activity tolerance. This is a very different clinical picture vs prior PT evaluation, given increased level of assist now needed PT recommending CIR post-acutely.     Follow Up Recommendations Supervision for mobility/OOB;CIR    Equipment Recommendations  Rolling walker with 5" wheels    Recommendations for Other Services OT consult     Precautions / Restrictions Precautions Precautions: Fall Precaution Comments: Monitor HR      Mobility  Bed Mobility Overal bed mobility: Needs Assistance Bed Mobility: Supine to Sit;Sit to Supine     Supine to sit: Max assist Sit to supine: Max assist   General bed mobility comments: max assist for moving to/from EOB for RLE management, scooting to EOB.    Transfers Overall transfer level: Needs assistance Equipment used: 1 person hand held assist Transfers: Sit  to/from Stand Sit to Stand: Max assist         General transfer comment: max assist for power up, rise, steady, and RLE blocking; repeated stand for wife to remove soiled pad and placement of clean pad.  Ambulation/Gait             General Gait Details: NT  Stairs            Wheelchair Mobility    Modified Rankin (Stroke Patients Only) Modified Rankin (Stroke Patients Only) Pre-Morbid Rankin Score: No symptoms Modified Rankin: Moderately severe disability     Balance Overall balance assessment: Needs assistance Sitting-balance support: No upper extremity supported;Feet supported Sitting balance-Leahy Scale: Fair     Standing balance support: During functional activity;Bilateral upper extremity supported Standing balance-Leahy Scale: Poor                               Pertinent Vitals/Pain Pain Assessment: Faces Faces Pain Scale: No hurt Pain Intervention(s): Monitored during session    Home Living Family/patient expects to be discharged to:: Private residence Living Arrangements: Spouse/significant other Available Help at Discharge: Family;Available PRN/intermittently (Wife works 5 days/wk) Type of Home: House Home Access: Stairs to enter Entrance Stairs-Rails: Psychiatric nurse of Steps: 3-4 Home Layout: One level Home Equipment: Cane - single point;Hand held shower head      Prior Function Level of Independence: Independent         Comments: I with ADLs/IADLs without AD; drives; shared responsibility with wife for IADLs     Hand Dominance   Dominant  Hand: Right    Extremity/Trunk Assessment   Upper Extremity Assessment Upper Extremity Assessment: Defer to OT evaluation RUE Deficits / Details: no active RUE movement observed    Lower Extremity Assessment Lower Extremity Assessment: RLE deficits/detail RLE Deficits / Details: able to perform hip abd/add for partial ROM, hip flexion contraction, and knee  extension contraction in supine. No active DF/PF LLE Sensation: WNL    Cervical / Trunk Assessment Cervical / Trunk Assessment: Normal  Communication   Communication: Expressive difficulties;HOH (Slurred speech and word finding deficits)  Cognition Arousal/Alertness: Awake/alert Behavior During Therapy: WFL for tasks assessed/performed Overall Cognitive Status: No family/caregiver present to determine baseline cognitive functioning                                 General Comments: Pt answering questions appropriately, requiring increased processing time to do so. Intermittent slurred speech and difficulty with word finding      General Comments General comments (skin integrity, edema, etc.): HR 100-130s bpm, RN notified of PT assessment    Exercises     Assessment/Plan    PT Assessment Patient needs continued PT services  PT Problem List         PT Treatment Interventions Functional mobility training;Therapeutic exercise;Therapeutic activities;Balance training;Neuromuscular re-education;Patient/family education;Stair training;Gait training;DME instruction    PT Goals (Current goals can be found in the Care Plan section)  Acute Rehab PT Goals Patient Stated Goal: get better PT Goal Formulation: With patient/family Time For Goal Achievement: 03/01/21 Potential to Achieve Goals: Good    Frequency Min 4X/week   Barriers to discharge        Co-evaluation               AM-PAC PT "6 Clicks" Mobility  Outcome Measure Help needed turning from your back to your side while in a flat bed without using bedrails?: A Lot Help needed moving from lying on your back to sitting on the side of a flat bed without using bedrails?: A Lot Help needed moving to and from a bed to a chair (including a wheelchair)?: A Lot Help needed standing up from a chair using your arms (e.g., wheelchair or bedside chair)?: A Lot Help needed to walk in hospital room?: A Lot Help  needed climbing 3-5 steps with a railing? : Total 6 Click Score: 11    End of Session Equipment Utilized During Treatment: Gait belt Activity Tolerance: Patient limited by fatigue;Other (comment) (change in neuro status, RN notified of PT assesment) Patient left: in bed;with family/visitor present;with call bell/phone within reach Nurse Communication: Mobility status PT Visit Diagnosis: Other abnormalities of gait and mobility (R26.89);Muscle weakness (generalized) (M62.81);Difficulty in walking, not elsewhere classified (R26.2)    Time: 1525-1550 PT Time Calculation (min) (ACUTE ONLY): 25 min   Charges:   PT Evaluation $PT Re-evaluation: 1 Re-eval          Stacie Glaze, PT DPT Acute Rehabilitation Services Pager [phone]  Office [phone]  Roxine Caddy E Ruffin Pyo 02/17/2021, 5:14 PM

## 2021-02-17 NOTE — Progress Notes (Addendum)
OT Cancellation Note  Patient Details Name: Juan Wheeler MRN: [id number] DOB: [birth year]   Cancelled Treatment:    Reason Eval/Treat Not Completed: Medical issues which prohibited therapy;Other (comment) per chart review, patient has worsening slurred speech and right-sided deficits, awaiting CT results, will await results prior to OT session. Will follow acutely per POC.   Corinne Ports K., COTA/L Acute Rehabilitation Services [phone]   [name] 02/17/2021, 4:28 PM

## 2021-02-17 NOTE — Progress Notes (Addendum)
Paged Dr Avon Gully because is more slurred and his right leg was a 4/5 now it is a 2/5.  We attempted to get him up to walk and he could not even hold himself up to sit on the side of the bed.  He has some right sided neglect as well.  I called Dr Avon Gully who did note that he had noticed the same thing, as he does wax and wane with this.  I was going to call a code stroke but Dr said this has been what he was doing and there would be nothing any different that we would do.  He did order a stat head CT though.  Wife at his side and aware of what is going on and was able to speak with Dr Avon Gully on the phone.

## 2021-02-17 NOTE — Progress Notes (Signed)
PROGRESS NOTE    [facility]  LYY:[id number] DOB: Jun 15, 1961 DOA: 02/15/2021 PCP: [name], MD   Brief Narrative:  [facility] is a 60 y.o. male with medical history significant for squamous cell carcinoma of the lung, rectal adenocarcinoma, neuropathy, cardiomyopathy with LVEF 30-35%, bilateral PE on Eliquis, atrial fibrillation, and CAD/PAD who presented to the ED this morning after acute onset of diffuse weakness at home.  His last known well around 6:30 AM and symptoms began shortly after that.  He tried to use his urine with his right hand but it fell out of his hands and when he tried to pick it up from the floor with his other hand and he dropped it again.  His legs then became weak after he went to his couch.  He tried to get up but he could not do so on account of the weakness.  After few minutes of this he called his wife and he was noted to have a blank look on his face.  His wife called EMS and he was brought to the ED for further evaluation. CT of the head with no acute findings noted.  He has been evaluated by teleneurology with recommendations for brain and cervical spine MRI as well as MRA of the head and neck.  He was not noted to be a candidate for tPA due to his use of Eliquis.  He will also require PT/OT evaluation and permissive hypertension.  He is to continue on his Eliquis and statin.  He will need transfer to [facility] for imaging as well as neurology evaluation. UDS positive for marijuana.   **Afternoon update, patient has worsening slurred speech and right-sided deficits from this morning NIH stroke scale increased by approximately 3-4 points from morning evaluation.  As such we will repeat head CT to ensure no transition from ischemic to hemorrhagic stroke.  Less likely but given patient's change will follow imaging closely, if necessary will consult neurology or neurosurgery as appropriate.  Assessment & Plan:  Acute ischemic CVA with profound bilateral  weakness and ambulatory dysfunction, POA - MRI shows multifocal acute ischemia within both hemispheres -concerning for embolic origin, most likely A. fib given his history; occlusion of the left vertebral artery with distal reconstitution -Patient denies noncompliance with anticoagulation,continue Eliquis and start statin -Lipid panel essentially unremarkable; A1c 5.9 -PT/OT/speech evaluation ongoing per protocol - difficulty ambulating 5/22 - will continue to advance with PT as tolerated - likely to require SNF -Recent 2D echocardiogram performed 4/22 with LVEF 30-35% -Monitor on telemetry -A. fib as below, chronic  Afib, somewhat rate controlled -Continue Eliquis for anticoagulation -Likely the source of above stroke, patient denies noncompliance which is worrisome if true given stroke while on full anticoagulation -Resume home metoprolol, follow telemetry  History of hypertension/CAD/PAD -Permissive hypertension today, resume home medications per neurology -Plan to start statin  History of rectal adenocarcinoma/squamous cell lung cancer -Follow-up with oncology outpatient  Marijuana use -Noted on UDS  DVT prophylaxis: Eliquis Code Status: Full Family Communication: None present  Status is: Inpatient  Dispo: The patient is from: Home              Anticipated d/c is to: To be determined              Anticipated d/c date is: 48 to 72 hours              Patient currently not medically stable for discharge  Consultants:   Tele-Neurology  Procedures:  None  Antimicrobials:  None indicated  Subjective: No acute issues events overnight, speech somewhat slurred but ROS unremarkable, somewhat frustrated with RUE/RLE weakness and speech difficulties.  Objective: Vitals:   02/16/21 1917 02/16/21 2328 02/17/21 0039 02/17/21 0300  BP: 129/89  126/82 113/82  Pulse:  76 74 76  Resp:   14   Temp: 97.8 F (36.6 C) 98 F (36.7 C) (!) 97.5 F (36.4 C) 97.6 F (36.4 C)   TempSrc: Oral Oral Oral Oral  SpO2:  98% 99% 96%  Weight:      Height:        Intake/Output Summary (Last 24 hours) at 02/17/2021 0729 Last data filed at 02/17/2021 0653 Gross per 24 hour  Intake 240 ml  Output 300 ml  Net -60 ml   Filed Weights   02/15/21 0815  Weight: 71.2 kg    Examination:  General:  Pleasantly resting in bed, No acute distress. HEENT:  Normocephalic atraumatic.  Sclerae nonicteric, noninjected.  Extraocular movements intact bilaterally. Slurred speech and difficulty word finding Neck:  Without mass or deformity.  Trachea is midline. Lungs:  Clear to auscultate bilaterally without rhonchi, wheeze, or rales. Heart:  Regular rate and rhythm.  Without murmurs, rubs, or gallops. Abdomen:  Soft, nontender, nondistended.  Without guarding or rebound. Extremities: Right upper extremity weakness, difficulty with fine motor skills; RUE 2/5, RLE 3/5 Vascular:  Dorsalis pedis and posterior tibial pulses palpable bilaterally. Skin:  Warm and dry, no erythema, no ulcerations.   Data Reviewed: I have personally reviewed following labs and imaging studies  CBC: Recent Labs  Lab 02/15/21 0833 02/15/21 0912 02/17/21 0530  WBC 7.3  --  5.8  NEUTROABS 5.8  --   --   HGB 15.1 15.6 15.2  HCT 45.0 46.0 45.1  MCV 101.1*  --  98.5  PLT 134*  --  751*   Basic Metabolic Panel: Recent Labs  Lab 02/15/21 0833 02/15/21 0912 02/17/21 0530  NA 139 142 136  K 3.9 4.0 4.2  CL 106 105 104  CO2 27  --  26  GLUCOSE 117* 107* 94  BUN 11 9 9   CREATININE 1.21 1.20 1.18  CALCIUM 9.1  --  8.7*   GFR: Estimated Creatinine Clearance: 66.6 mL/min (by C-G formula based on SCr of 1.18 mg/dL). Liver Function Tests: Recent Labs  Lab 02/15/21 0833  AST 28  ALT 25  ALKPHOS 118  BILITOT 0.9  PROT 7.1  ALBUMIN 3.1*   No results for input(s): LIPASE, AMYLASE in the last 168 hours. Recent Labs  Lab 02/15/21 1023  AMMONIA 12   Coagulation Profile: Recent Labs  Lab  02/15/21 0833  INR 1.3*   Cardiac Enzymes: No results for input(s): CKTOTAL, CKMB, CKMBINDEX, TROPONINI in the last 168 hours. BNP (last 3 results) No results for input(s): PROBNP in the last 8760 hours. HbA1C: No results for input(s): HGBA1C in the last 72 hours. CBG: No results for input(s): GLUCAP in the last 168 hours. Lipid Profile: Recent Labs    02/16/21 0445  CHOL 158  HDL 44  LDLCALC 99  TRIG 73  CHOLHDL 3.6   Thyroid Function Tests: Recent Labs    02/15/21 1023  TSH 0.979   Anemia Panel: No results for input(s): VITAMINB12, FOLATE, FERRITIN, TIBC, IRON, RETICCTPCT in the last 72 hours. Sepsis Labs: No results for input(s): PROCALCITON, LATICACIDVEN in the last 168 hours.  Recent Results (from the past 240 hour(s))  Resp Panel by RT-PCR (Flu A&B, Covid)  Nasopharyngeal Swab     Status: None   Collection Time: 02/15/21  8:41 AM   Specimen: Nasopharyngeal Swab; Nasopharyngeal(NP) swabs in vial transport medium  Result Value Ref Range Status   SARS Coronavirus 2 by RT PCR NEGATIVE NEGATIVE Final    Comment: (NOTE) SARS-CoV-2 target nucleic acids are NOT DETECTED.  The SARS-CoV-2 RNA is generally detectable in upper respiratory specimens during the acute phase of infection. The lowest concentration of SARS-CoV-2 viral copies this assay can detect is 138 copies/mL. A negative result does not preclude SARS-Cov-2 infection and should not be used as the sole basis for treatment or other patient management decisions. A negative result may occur with  improper specimen collection/handling, submission of specimen other than nasopharyngeal swab, presence of viral mutation(s) within the areas targeted by this assay, and inadequate number of viral copies(<138 copies/mL). A negative result must be combined with clinical observations, patient history, and epidemiological information. The expected result is Negative.  Fact Sheet for Patients:   EntrepreneurPulse.com.au  Fact Sheet for Healthcare Providers:  IncredibleEmployment.be  This test is no t yet approved or cleared by the [place] FDA and  has been authorized for detection and/or diagnosis of SARS-CoV-2 by FDA under an Emergency Use Authorization (EUA). This EUA will remain  in effect (meaning this test can be used) for the duration of the COVID-19 declaration under Section 564(b)(1) of the Act, 21 U.S.C.section 360bbb-3(b)(1), unless the authorization is terminated  or revoked sooner.       Influenza A by PCR NEGATIVE NEGATIVE Final   Influenza B by PCR NEGATIVE NEGATIVE Final    Comment: (NOTE) The Xpert Xpress SARS-CoV-2/FLU/RSV plus assay is intended as an aid in the diagnosis of influenza from Nasopharyngeal swab specimens and should not be used as a sole basis for treatment. Nasal washings and aspirates are unacceptable for Xpert Xpress SARS-CoV-2/FLU/RSV testing.  Fact Sheet for Patients: EntrepreneurPulse.com.au  Fact Sheet for Healthcare Providers: IncredibleEmployment.be  This test is not yet approved or cleared by the [place] FDA and has been authorized for detection and/or diagnosis of SARS-CoV-2 by FDA under an Emergency Use Authorization (EUA). This EUA will remain in effect (meaning this test can be used) for the duration of the COVID-19 declaration under Section 564(b)(1) of the Act, 21 U.S.C. section 360bbb-3(b)(1), unless the authorization is terminated or revoked.  Performed at [facility], [address], [place], [place] [address]          Radiology Studies: MR ANGIO HEAD WO CONTRAST  Result Date: 02/15/2021 CLINICAL DATA:  Squamous cell carcinoma of the tongue. Acute neurologic deficit. EXAM: MRI HEAD WITHOUT CONTRAST MRA HEAD WITHOUT CONTRAST MRA OF THE NECK WITHOUT AND WITH CONTRAST TECHNIQUE: Multiplanar, multi-echo pulse sequences of the brain  and surrounding structures were acquired without intravenous contrast. Angiographic images of the Circle of Willis were acquired using MRA technique without intravenous contrast. Angiographic images of the neck were acquired using MRA technique without and with intravenous contrast. Carotid stenosis measurements (when applicable) are obtained utilizing NASCET criteria, using the distal internal carotid diameter as the denominator. CONTRAST:  40mL GADAVIST GADOBUTROL 1 MMOL/ML IV SOLN COMPARISON:  No pertinent prior exam. FINDINGS: MR HEAD FINDINGS Brain: There is multifocal abnormal diffusion restriction within both hemispheres, left-greater-than-right. The largest area of abnormality is at the posterior left insula. There are other lesions in the right frontal lobe, both parietal lobes and both occipital lobes. Focus of chronic microhemorrhage in the left frontal operculum. There is multifocal hyperintense  T2-weighted signal within the white matter. Generalized volume loss without a clear lobar predilection. The midline structures are normal. Vascular: Major flow voids are preserved. Skull and upper cervical spine: Normal calvarium and skull base. Visualized upper cervical spine and soft tissues are normal. Sinuses/Orbits:No paranasal sinus fluid levels or advanced mucosal thickening. No mastoid or middle ear effusion. Normal orbits. MRA HEAD FINDINGS POSTERIOR CIRCULATION: --Vertebral arteries: Normal --Inferior cerebellar arteries: Normal. --Basilar artery: Normal. --Superior cerebellar arteries: Normal. --Posterior cerebral arteries: Normal. ANTERIOR CIRCULATION: --Intracranial internal carotid arteries: Normal. --Anterior cerebral arteries (ACA): Normal. --Middle cerebral arteries (MCA): Normal. ANATOMIC VARIANTS: None MRA NECK FINDINGS Aortic arch: Normal 3 vessel branching pattern. Right carotid system: No stenosis or occlusion Left carotid system: No stenosis or occlusion Vertebral arteries: The left  vertebral artery is occluded. The distal portion is reconstituted, likely due to flow across the vertebrobasilar confluence. Other: None. IMPRESSION: 1. Multifocal acute ischemia within both hemispheres, left-greater-than-right, and multiple vascular territories. The largest area of abnormality is at the posterior left insula. The pattern is most suggestive of an embolic process, particularly in a patient with atrial fibrillation. 2. Occlusion of the left vertebral artery with distal reconstitution due to flow across the vertebrobasilar confluence. 3. Normal intracranial MRA Electronically Signed   By: Ulyses Jarred M.D.   On: 02/15/2021 23:44   MR ANGIO NECK W WO CONTRAST  Result Date: 02/15/2021 CLINICAL DATA:  Squamous cell carcinoma of the tongue. Acute neurologic deficit. EXAM: MRI HEAD WITHOUT CONTRAST MRA HEAD WITHOUT CONTRAST MRA OF THE NECK WITHOUT AND WITH CONTRAST TECHNIQUE: Multiplanar, multi-echo pulse sequences of the brain and surrounding structures were acquired without intravenous contrast. Angiographic images of the Circle of Willis were acquired using MRA technique without intravenous contrast. Angiographic images of the neck were acquired using MRA technique without and with intravenous contrast. Carotid stenosis measurements (when applicable) are obtained utilizing NASCET criteria, using the distal internal carotid diameter as the denominator. CONTRAST:  35mL GADAVIST GADOBUTROL 1 MMOL/ML IV SOLN COMPARISON:  No pertinent prior exam. FINDINGS: MR HEAD FINDINGS Brain: There is multifocal abnormal diffusion restriction within both hemispheres, left-greater-than-right. The largest area of abnormality is at the posterior left insula. There are other lesions in the right frontal lobe, both parietal lobes and both occipital lobes. Focus of chronic microhemorrhage in the left frontal operculum. There is multifocal hyperintense T2-weighted signal within the white matter. Generalized volume loss  without a clear lobar predilection. The midline structures are normal. Vascular: Major flow voids are preserved. Skull and upper cervical spine: Normal calvarium and skull base. Visualized upper cervical spine and soft tissues are normal. Sinuses/Orbits:No paranasal sinus fluid levels or advanced mucosal thickening. No mastoid or middle ear effusion. Normal orbits. MRA HEAD FINDINGS POSTERIOR CIRCULATION: --Vertebral arteries: Normal --Inferior cerebellar arteries: Normal. --Basilar artery: Normal. --Superior cerebellar arteries: Normal. --Posterior cerebral arteries: Normal. ANTERIOR CIRCULATION: --Intracranial internal carotid arteries: Normal. --Anterior cerebral arteries (ACA): Normal. --Middle cerebral arteries (MCA): Normal. ANATOMIC VARIANTS: None MRA NECK FINDINGS Aortic arch: Normal 3 vessel branching pattern. Right carotid system: No stenosis or occlusion Left carotid system: No stenosis or occlusion Vertebral arteries: The left vertebral artery is occluded. The distal portion is reconstituted, likely due to flow across the vertebrobasilar confluence. Other: None. IMPRESSION: 1. Multifocal acute ischemia within both hemispheres, left-greater-than-right, and multiple vascular territories. The largest area of abnormality is at the posterior left insula. The pattern is most suggestive of an embolic process, particularly in a patient with atrial fibrillation. 2. Occlusion of the left  vertebral artery with distal reconstitution due to flow across the vertebrobasilar confluence. 3. Normal intracranial MRA Electronically Signed   By: Ulyses Jarred M.D.   On: 02/15/2021 23:44   MR BRAIN WO CONTRAST  Result Date: 02/15/2021 CLINICAL DATA:  Squamous cell carcinoma of the tongue. Acute neurologic deficit. EXAM: MRI HEAD WITHOUT CONTRAST MRA HEAD WITHOUT CONTRAST MRA OF THE NECK WITHOUT AND WITH CONTRAST TECHNIQUE: Multiplanar, multi-echo pulse sequences of the brain and surrounding structures were acquired  without intravenous contrast. Angiographic images of the Circle of Willis were acquired using MRA technique without intravenous contrast. Angiographic images of the neck were acquired using MRA technique without and with intravenous contrast. Carotid stenosis measurements (when applicable) are obtained utilizing NASCET criteria, using the distal internal carotid diameter as the denominator. CONTRAST:  98mL GADAVIST GADOBUTROL 1 MMOL/ML IV SOLN COMPARISON:  No pertinent prior exam. FINDINGS: MR HEAD FINDINGS Brain: There is multifocal abnormal diffusion restriction within both hemispheres, left-greater-than-right. The largest area of abnormality is at the posterior left insula. There are other lesions in the right frontal lobe, both parietal lobes and both occipital lobes. Focus of chronic microhemorrhage in the left frontal operculum. There is multifocal hyperintense T2-weighted signal within the white matter. Generalized volume loss without a clear lobar predilection. The midline structures are normal. Vascular: Major flow voids are preserved. Skull and upper cervical spine: Normal calvarium and skull base. Visualized upper cervical spine and soft tissues are normal. Sinuses/Orbits:No paranasal sinus fluid levels or advanced mucosal thickening. No mastoid or middle ear effusion. Normal orbits. MRA HEAD FINDINGS POSTERIOR CIRCULATION: --Vertebral arteries: Normal --Inferior cerebellar arteries: Normal. --Basilar artery: Normal. --Superior cerebellar arteries: Normal. --Posterior cerebral arteries: Normal. ANTERIOR CIRCULATION: --Intracranial internal carotid arteries: Normal. --Anterior cerebral arteries (ACA): Normal. --Middle cerebral arteries (MCA): Normal. ANATOMIC VARIANTS: None MRA NECK FINDINGS Aortic arch: Normal 3 vessel branching pattern. Right carotid system: No stenosis or occlusion Left carotid system: No stenosis or occlusion Vertebral arteries: The left vertebral artery is occluded. The distal portion  is reconstituted, likely due to flow across the vertebrobasilar confluence. Other: None. IMPRESSION: 1. Multifocal acute ischemia within both hemispheres, left-greater-than-right, and multiple vascular territories. The largest area of abnormality is at the posterior left insula. The pattern is most suggestive of an embolic process, particularly in a patient with atrial fibrillation. 2. Occlusion of the left vertebral artery with distal reconstitution due to flow across the vertebrobasilar confluence. 3. Normal intracranial MRA Electronically Signed   By: Ulyses Jarred M.D.   On: 02/15/2021 23:44   MR CERVICAL SPINE WO CONTRAST  Result Date: 02/17/2021 CLINICAL DATA:  Progressive myelopathy EXAM: MRI CERVICAL SPINE WITHOUT CONTRAST TECHNIQUE: Multiplanar, multisequence MR imaging of the cervical spine was performed. No intravenous contrast was administered. COMPARISON:  None. FINDINGS: Alignment: Physiologic. Vertebrae: No fracture, evidence of discitis, or bone lesion. Cord: Normal signal and morphology. Posterior Fossa, vertebral arteries, paraspinal tissues: Abnormal left vertebral artery flow void. Disc levels: C1-C2: Normal. C2-C3: Mild left uncovertebral hypertrophy. Mild left foraminal stenosis. No spinal canal stenosis. C3-C4: Small disc bulge with bilateral uncovertebral hypertrophy. Mild left foraminal stenosis. C4-C5: Small disc bulge without stenosis. C5-C6: Small disc bulge with uncovertebral hypertrophy. Mild spinal canal and bilateral neural foraminal stenosis. C6-C7: Small disc bulge with left-greater-than-right uncovertebral hypertrophy. Mild left foraminal stenosis. C7-T1: Normal disc space and facets. No spinal canal or neuroforaminal stenosis. IMPRESSION: 1. Abnormal left vertebral artery flow void consistent with occlusion or high-grade stenosis. 2. Mild spinal canal and bilateral neural foraminal stenosis at  C5-C6. 3. Mild left C2-3, left C3-4 and left C6-7 neural foraminal stenosis.  Electronically Signed   By: Ulyses Jarred M.D.   On: 02/17/2021 01:17   CT HEAD CODE STROKE WO CONTRAST  Result Date: 02/15/2021 CLINICAL DATA:  Code stroke. 60 year old male with acute neurologic deficit. Colorectal cancer. EXAM: CT HEAD WITHOUT CONTRAST TECHNIQUE: Contiguous axial images were obtained from the base of the skull through the vertex without intravenous contrast. COMPARISON:  Brain MRI 05/20/2020. FINDINGS: Brain: Broad-based area of course pachymeningeal calcification along the left superior hemisphere convexity is stable from the MRI last year although more conspicuous on CT. No acute intracranial hemorrhage identified. No acute intracranial mass effect. No ventriculomegaly. Widespread patchy white matter hypodensity, more pronounced in the right hemisphere. No cortically based acute infarct identified. No cortical encephalomalacia identified. Vascular: Calcified atherosclerosis at the skull base. No suspicious intracranial vascular hyperdensity. Skull: No acute or suspicious osseous lesion identified. Sinuses/Orbits: Visualized paranasal sinuses and mastoids are stable and well aerated. Other: Visualized orbits and scalp soft tissues are within normal limits. ASPECTS [facility] Stroke Program Early CT Score) Total score (0-10 with 10 being normal): 10 IMPRESSION: 1. No acute cortically based infarct or acute intracranial hemorrhage identified. ASPECTS 10. 2. Coarse chronic dural calcification along the left hemisphere, perhaps sequelae of a remote subdural hematoma or infection. Chronic white matter disease. Electronically Signed   By: Genevie Ann M.D.   On: 02/15/2021 09:07   Scheduled Meds: . apixaban  5 mg Oral BID  . aspirin EC  81 mg Oral Daily  . Chlorhexidine Gluconate Cloth  6 each Topical Daily  . metoprolol succinate  25 mg Oral Daily  . pantoprazole  40 mg Oral Daily  . simvastatin  40 mg Oral q1800   Continuous Infusions:   LOS: 2 days   Time spent: 56min  Ojas Coone C  Rin Gorton, DO [facility]  If 7PM-7AM, please contact night-coverage [email or web address]  02/17/2021, 7:29 AM

## 2021-02-17 NOTE — Plan of Care (Signed)

## 2021-02-17 NOTE — Consult Note (Addendum)
[facility] Nurse ostomy consult note Stoma type/location: RLQ ileostomy, long established.  Wife and patient care for ostomy at home. He is not able to participate in his own care at this time.  Stomal assessment/size: 1" pink and moist  Producing liquid green stool.  Peristomal assessment: pouch intact  Treatment options for stomal/peristomal skin: barrier ring and 1 piece pouch.  LAWSON # [id number] OMVEHM CNOB SJGGEZ # [id number] Output liquid green stool.  Ostomy pouching: 1pc.convex with barrier ring Education provided: Patient and wife manage pouch at home.  Patient will need assistance at this time due to current state of health.  RUE weakness noted at this time.  OT is seeing patient as well.   Enrolled patient in [place] program: Yes  Previously.  WOC Nurse Consult Note: Reason for Consult:History of left buttock abscess with I & D and fistula formation to the rectum.  This has area has minimal purulence chronically and is covered with dry dressing. [facility] by surgeon for follow up 02/11/21 with no additional intervention warranted.  Wound type: Infectious Pressure Injury POA: NA Measurement: 1 cm x 1 cm x 0.2 cm palpable with cotton tip applicator Wound bed:not able to visualize Drainage (amount, consistency, odor) scant purulence at times  Musty odor reported Periwound:scarring from previous abscess and I & D  Dressing procedure/placement/frequency: Dry dressing to left buttock wound  Change daily.   Will not follow at this time.  Please re-consult if needed.  Domenic Moras MSN, RN, FNP-BC CWON Wound, Ostomy, Continence Nurse Pager [phone]

## 2021-02-18 ENCOUNTER — Other Ambulatory Visit (HOSPITAL_COMMUNITY): Payer: Self-pay

## 2021-02-18 ENCOUNTER — Encounter (HOSPITAL_COMMUNITY): Payer: Self-pay | Admitting: Hematology

## 2021-02-18 DIAGNOSIS — G459 Transient cerebral ischemic attack, unspecified: Secondary | ICD-10-CM | POA: Diagnosis not present

## 2021-02-18 DIAGNOSIS — I10 Essential (primary) hypertension: Secondary | ICD-10-CM | POA: Diagnosis not present

## 2021-02-18 LAB — CBC
HCT: 46.6 % (ref 39.0–52.0)
Hemoglobin: 16.1 g/dL (ref 13.0–17.0)
MCH: 33.5 pg (ref 26.0–34.0)
MCHC: 34.5 g/dL (ref 30.0–36.0)
MCV: 97.1 fL (ref 80.0–100.0)
Platelets: 128 10*3/uL — ABNORMAL LOW (ref 150–400)
RBC: 4.8 MIL/uL (ref 4.22–5.81)
RDW: 15.2 % (ref 11.5–15.5)
WBC: 6.8 10*3/uL (ref 4.0–10.5)
nRBC: 0 % (ref 0.0–0.2)

## 2021-02-18 LAB — BASIC METABOLIC PANEL
Anion gap: 10 (ref 5–15)
BUN: 7 mg/dL (ref 6–20)
CO2: 24 mmol/L (ref 22–32)
Calcium: 9 mg/dL (ref 8.9–10.3)
Chloride: 102 mmol/L (ref 98–111)
Creatinine, Ser: 1.08 mg/dL (ref 0.61–1.24)
GFR, Estimated: >60 mL/min (ref >60)
Glucose, Bld: 101 mg/dL — ABNORMAL HIGH (ref 70–99)
Potassium: 4.1 mmol/L (ref 3.5–5.1)
Sodium: 136 mmol/L (ref 135–145)

## 2021-02-18 LAB — MRSA PCR SCREENING: MRSA by PCR: NEGATIVE

## 2021-02-18 MED ORDER — METOPROLOL SUCCINATE ER 50 MG PO TB24
50.0000 mg | ORAL_TABLET | Freq: Every day | ORAL | Status: DC
  Administered 2021-02-19 – 2021-02-21 (×3): 50 mg via ORAL
  Filled 2021-02-18 (×3): qty 1

## 2021-02-18 MED ORDER — DABIGATRAN ETEXILATE MESYLATE 150 MG PO CAPS
150.0000 mg | ORAL_CAPSULE | Freq: Two times a day (BID) | ORAL | Status: DC
  Administered 2021-02-18 – 2021-02-21 (×6): 150 mg via ORAL
  Filled 2021-02-18 (×8): qty 1

## 2021-02-18 MED ORDER — METOPROLOL SUCCINATE ER 25 MG PO TB24
25.0000 mg | ORAL_TABLET | Freq: Once | ORAL | Status: AC
  Administered 2021-02-18: 25 mg via ORAL
  Filled 2021-02-18: qty 1

## 2021-02-18 NOTE — TOC Benefit Eligibility Note (Addendum)
Patient Teacher, English as a foreign language completed.    The patient is currently admitted and upon discharge could be taking Pradaxa 150 mg.  The current 30 day co-pay is, $9.85.   The patient is insured through [employer] [place] Patient Advocate Specialist [place] Team Direct Number: [phone]  Fax: [phone]

## 2021-02-18 NOTE — Progress Notes (Signed)
Rehab Admissions Coordinator Note:  Patient was screened by Cleatrice Burke for appropriateness for an Inpatient Acute Rehab Consult per therapy recs.   At this time, we are recommending Inpatient Rehab consult. I will place order per protocol.  Cleatrice Burke RN MSN 02/18/2021, 3:37 PM  I can be reached at [phone].

## 2021-02-18 NOTE — Progress Notes (Signed)
PROGRESS NOTE    Juan Wheeler  CNO:[id number] DOB: 1961-06-24 DOA: 02/15/2021 PCP: [name], MD   Brief Narrative:  Juan Wheeler is a 60 y.o. male with medical history significant for squamous cell carcinoma of the lung, rectal adenocarcinoma, neuropathy, cardiomyopathy with LVEF 30-35%, bilateral PE on Eliquis, atrial fibrillation, and CAD/PAD who presented to the ED this morning after acute onset of diffuse weakness at home.  His last known well around 6:30 AM and symptoms began shortly after that.  He tried to use his urine with his right hand but it fell out of his hands and when he tried to pick it up from the floor with his other hand and he dropped it again.  His legs then became weak after he went to his couch.  He tried to get up but he could not do so on account of the weakness.  After few minutes of this he called his wife and he was noted to have a blank look on his face.  His wife called EMS and he was brought to the ED for further evaluation. CT of the head with no acute findings noted.  He has been evaluated by teleneurology with recommendations for brain and cervical spine MRI as well as MRA of the head and neck.  He was not noted to be a candidate for tPA due to his use of Eliquis.  He will also require PT/OT evaluation and permissive hypertension.  He is to continue on his Eliquis and statin.  He will need transfer to [facility] for imaging as well as neurology evaluation. UDS positive for marijuana.   Late afternoon on 02/17/2021 patient has worsening slurred speech and right-sided deficits from this morning NIH stroke scale increased by approximately 3-4 points from morning evaluation.  As such we will repeat head CT to ensure no transition from ischemic to hemorrhagic stroke.  Neurology following, CT reassuringly shows no conversion to hemorrhagic stroke but will hold anticoagulation per recommendations for now.  Assessment & Plan:  Acute ischemic CVA with profound  bilateral weakness and ambulatory dysfunction, POA - MRI shows multifocal acute ischemia within both hemispheres - concerning for embolic origin, most likely A. fib given his history; occlusion of the left vertebral artery with distal reconstitution - Patient denies noncompliance with anticoagulation, hold Eliquis per neurology given risk to convert to hemorrhagic stroke in the interim - Lipid panel essentially unremarkable; A1c 5.9 - PT/OT/speech evaluation ongoing per protocol -recommending CIR - Recent 2D echocardiogram performed 4/22 with LVEF 30-35% - Monitor on telemetry -A. fib as below, chronic  Afib, somewhat rate controlled -Currently holding Eliquis -Likely the source of above stroke, patient denies noncompliance which is worrisome if true given stroke while on anticoagulation - likely  Higher risk than typical afib patient due to cancer diagnosis as below -Resume home metoprolol, follow telemetry  History of hypertension/CAD/PAD -Permissive hypertension today, resume home medications per neurology -Plan to start statin  History of rectal adenocarcinoma/squamous cell lung cancer -Follow-up with oncology outpatient  Marijuana use -Noted on UDS  DVT prophylaxis: SCDs/Early ambulation given above -defer to neurology on timeframe to resume anticoagulation Code Status: Full Family Communication: None present  Status is: Inpatient  Dispo: The patient is from: Home              Anticipated d/c is to: To be determined              Anticipated d/c date is: 48 to 72 hours  Patient currently not medically stable for discharge  Consultants:   Neurology  Procedures:   None  Antimicrobials:  None indicated  Subjective: No further acute issues or events overnight, patient appears to be stable from repeat evaluation yesterday late afternoon, denies nausea vomiting diarrhea constipation headache fevers or chills.  Objective: Vitals:   02/17/21 1600  02/17/21 1917 02/17/21 2320 02/18/21 0325  BP: (!) 130/105 120/78 129/82 (!) 139/91  Pulse: 92 84 (!) 101 69  Resp: 20 15 18 20   Temp: 97.9 F (36.6 C) 98 F (36.7 C) 98.1 F (36.7 C) (!) 97.5 F (36.4 C)  TempSrc: Oral Oral Oral Oral  SpO2: 94% 96% 95% 93%  Weight:      Height:        Intake/Output Summary (Last 24 hours) at 02/18/2021 0730 Last data filed at 02/18/2021 0411 Gross per 24 hour  Intake 680 ml  Output 670 ml  Net 10 ml   Filed Weights   02/15/21 0815  Weight: 71.2 kg    Examination:  General:  Pleasantly resting in bed, No acute distress. HEENT:  Normocephalic atraumatic.  Sclerae nonicteric, noninjected.  Extraocular movements intact bilaterally. Slurred speech and difficulty word finding Neck:  Without mass or deformity.  Trachea is midline. Lungs:  Clear to auscultate bilaterally without rhonchi, wheeze, or rales. Heart:  Regular rate and rhythm.  Without murmurs, rubs, or gallops. Abdomen:  Soft, nontender, nondistended.  Without guarding or rebound. Extremities: Right upper extremity weakness, difficulty with fine motor skills; RUE 1/5, RLE 1/5 Vascular:  Dorsalis pedis and posterior tibial pulses palpable bilaterally. Skin:  Warm and dry, no erythema, no ulcerations.   Data Reviewed: I have personally reviewed following labs and imaging studies  CBC: Recent Labs  Lab 02/15/21 0833 02/15/21 0912 02/16/21 0445 02/17/21 0530 02/18/21 0139  WBC 7.3  --  6.2 5.8 6.8  NEUTROABS 5.8  --   --   --   --   HGB 15.1 15.6 14.9 15.2 16.1  HCT 45.0 46.0 43.9 45.1 46.6  MCV 101.1*  --  99.5 98.5 97.1  PLT 134*  --  123* 125* 675*   Basic Metabolic Panel: Recent Labs  Lab 02/15/21 0833 02/15/21 0912 02/16/21 0445 02/17/21 0530 02/18/21 0139  NA 139 142 137 136 136  K 3.9 4.0 4.0 4.2 4.1  CL 106 105 106 104 102  CO2 27  --  26 26 24   GLUCOSE 117* 107* 88 94 101*  BUN 11 9 7 9 7   CREATININE 1.21 1.20 1.12 1.18 1.08  CALCIUM 9.1  --  8.6* 8.7*  9.0  MG  --   --  1.8  --   --    GFR: Estimated Creatinine Clearance: 72.7 mL/min (by C-G formula based on SCr of 1.08 mg/dL). Liver Function Tests: Recent Labs  Lab 02/15/21 0833  AST 28  ALT 25  ALKPHOS 118  BILITOT 0.9  PROT 7.1  ALBUMIN 3.1*   No results for input(s): LIPASE, AMYLASE in the last 168 hours. Recent Labs  Lab 02/15/21 1023  AMMONIA 12   Coagulation Profile: Recent Labs  Lab 02/15/21 0833  INR 1.3*   Cardiac Enzymes: No results for input(s): CKTOTAL, CKMB, CKMBINDEX, TROPONINI in the last 168 hours. BNP (last 3 results) No results for input(s): PROBNP in the last 8760 hours. HbA1C: Recent Labs    02/16/21 0445  HGBA1C 6.8*   CBG: No results for input(s): GLUCAP in the last 168 hours. Lipid Profile: Recent  Labs    02/16/21 0445  CHOL 158  HDL 44  LDLCALC 99  TRIG 73  CHOLHDL 3.6   Thyroid Function Tests: Recent Labs    02/15/21 1023  TSH 0.979   Anemia Panel: No results for input(s): VITAMINB12, FOLATE, FERRITIN, TIBC, IRON, RETICCTPCT in the last 72 hours. Sepsis Labs: No results for input(s): PROCALCITON, LATICACIDVEN in the last 168 hours.  Recent Results (from the past 240 hour(s))  Resp Panel by RT-PCR (Flu A&B, Covid) Nasopharyngeal Swab     Status: None   Collection Time: 02/15/21  8:41 AM   Specimen: Nasopharyngeal Swab; Nasopharyngeal(NP) swabs in vial transport medium  Result Value Ref Range Status   SARS Coronavirus 2 by RT PCR NEGATIVE NEGATIVE Final    Comment: (NOTE) SARS-CoV-2 target nucleic acids are NOT DETECTED.  The SARS-CoV-2 RNA is generally detectable in upper respiratory specimens during the acute phase of infection. The lowest concentration of SARS-CoV-2 viral copies this assay can detect is 138 copies/mL. A negative result does not preclude SARS-Cov-2 infection and should not be used as the sole basis for treatment or other patient management decisions. A negative result may occur with  improper  specimen collection/handling, submission of specimen other than nasopharyngeal swab, presence of viral mutation(s) within the areas targeted by this assay, and inadequate number of viral copies(<138 copies/mL). A negative result must be combined with clinical observations, patient history, and epidemiological information. The expected result is Negative.  Fact Sheet for Patients:  EntrepreneurPulse.com.au  Fact Sheet for Healthcare Providers:  IncredibleEmployment.be  This test is no t yet approved or cleared by the [place] FDA and  has been authorized for detection and/or diagnosis of SARS-CoV-2 by FDA under an Emergency Use Authorization (EUA). This EUA will remain  in effect (meaning this test can be used) for the duration of the COVID-19 declaration under Section 564(b)(1) of the Act, 21 U.S.C.section 360bbb-3(b)(1), unless the authorization is terminated  or revoked sooner.       Influenza A by PCR NEGATIVE NEGATIVE Final   Influenza B by PCR NEGATIVE NEGATIVE Final    Comment: (NOTE) The Xpert Xpress SARS-CoV-2/FLU/RSV plus assay is intended as an aid in the diagnosis of influenza from Nasopharyngeal swab specimens and should not be used as a sole basis for treatment. Nasal washings and aspirates are unacceptable for Xpert Xpress SARS-CoV-2/FLU/RSV testing.  Fact Sheet for Patients: EntrepreneurPulse.com.au  Fact Sheet for Healthcare Providers: IncredibleEmployment.be  This test is not yet approved or cleared by the [place] FDA and has been authorized for detection and/or diagnosis of SARS-CoV-2 by FDA under an Emergency Use Authorization (EUA). This EUA will remain in effect (meaning this test can be used) for the duration of the COVID-19 declaration under Section 564(b)(1) of the Act, 21 U.S.C. section 360bbb-3(b)(1), unless the authorization is terminated or revoked.  Performed at  [facility], [address] [place], [place] [address]          Radiology Studies: CT HEAD WO CONTRAST  Result Date: 02/17/2021 CLINICAL DATA:  Stroke, worsening right-sided deficits EXAM: CT HEAD WITHOUT CONTRAST TECHNIQUE: Contiguous axial images were obtained from the base of the skull through the vertex without intravenous contrast. COMPARISON:  Correlation made with recent MRI and CT. FINDINGS: Brain: Interval development of areas of hypoattenuation, for example along the posterior left insula corresponding to acute infarcts on MRI. There is no evidence of hemorrhagic transformation. Stable findings of chronic microvascular ischemic changes. Ventricles are stable in size. Vascular: No  new findings. Skull: Calvarium is unremarkable. Sinuses/Orbits: No acute finding. Other: None. IMPRESSION: Areas of acute infarction on recent MRI are now partially visible on CT. No evidence of hemorrhagic transformation. Electronically Signed   By: Macy Mis M.D.   On: 02/17/2021 17:35   MR CERVICAL SPINE WO CONTRAST  Result Date: 02/17/2021 CLINICAL DATA:  Progressive myelopathy EXAM: MRI CERVICAL SPINE WITHOUT CONTRAST TECHNIQUE: Multiplanar, multisequence MR imaging of the cervical spine was performed. No intravenous contrast was administered. COMPARISON:  None. FINDINGS: Alignment: Physiologic. Vertebrae: No fracture, evidence of discitis, or bone lesion. Cord: Normal signal and morphology. Posterior Fossa, vertebral arteries, paraspinal tissues: Abnormal left vertebral artery flow void. Disc levels: C1-C2: Normal. C2-C3: Mild left uncovertebral hypertrophy. Mild left foraminal stenosis. No spinal canal stenosis. C3-C4: Small disc bulge with bilateral uncovertebral hypertrophy. Mild left foraminal stenosis. C4-C5: Small disc bulge without stenosis. C5-C6: Small disc bulge with uncovertebral hypertrophy. Mild spinal canal and bilateral neural foraminal stenosis. C6-C7: Small disc bulge with  left-greater-than-right uncovertebral hypertrophy. Mild left foraminal stenosis. C7-T1: Normal disc space and facets. No spinal canal or neuroforaminal stenosis. IMPRESSION: 1. Abnormal left vertebral artery flow void consistent with occlusion or high-grade stenosis. 2. Mild spinal canal and bilateral neural foraminal stenosis at C5-C6. 3. Mild left C2-3, left C3-4 and left C6-7 neural foraminal stenosis. Electronically Signed   By: Ulyses Jarred M.D.   On: 02/17/2021 01:17   Scheduled Meds: . aspirin EC  81 mg Oral Daily  . Chlorhexidine Gluconate Cloth  6 each Topical Daily  . metoprolol succinate  25 mg Oral Daily  . pantoprazole  40 mg Oral Daily  . simvastatin  40 mg Oral q1800   Continuous Infusions:   LOS: 3 days   Time spent: 80min  Nida Manfredi C Icess Bertoni, DO [facility]  If 7PM-7AM, please contact night-coverage [email or web address]  02/18/2021, 7:30 AM

## 2021-02-18 NOTE — Progress Notes (Signed)
Occupational Therapy REEVALUATION change in functional task  Patient Details Name: Juan Wheeler MRN: [id number] DOB: [birth year] Today's Date: 02/18/2021    History of present illness 60 y.o. male presenting to ED on 5/21 with acute onset of diffuse weakness. MRI (+) multifocal acute ischemia within both hemispheres (L >R) with largest area at posterior L insula suggestive of embolic process in setting of A-fib. Patient also with occlusion of L vertebral artery. CT 5/23  Areas of acute infarction on recent MRI are now partially visible on CT.PMHx significant for lung cancer, rectal cancer, neuropathy, cardiomyopathy, A-fib and CAD/PAD.   OT comments  Pt with only trace triceps in RUE. Pt with flaccid wrist digits elbow and scapula this session. Attempting multiple methods to engage scapula movement in all planes. Pt static sitting EOB with visual scanning task. Pt noted to have increased impairments so down graded goals / d/c now CIR> recommendation for CIR at this time. Pt was mod I at [facility] for d/c recommendation prior to today.   Follow Up Recommendations  CIR    Equipment Recommendations  3 in 1 bedside commode;Wheelchair (measurements OT);Wheelchair cushion (measurements OT)    Recommendations for Other Services Rehab consult    Precautions / Restrictions Precautions Precautions: Fall Precaution Comments: Monitor HR /watch BP       Mobility Bed Mobility                    Transfers Overall transfer level: Needs assistance Equipment used: 2 person hand held assist Transfers: Sit to/from Stand Sit to Stand: +2 physical assistance;Mod assist         General transfer comment: pt requires blocking of R LE and guarding of R UE    Balance Overall balance assessment: Needs assistance Sitting-balance support: Single extremity supported;Feet supported Sitting balance-Leahy Scale: Fair     Standing balance support: Single extremity supported;During functional  activity Standing balance-Leahy Scale: Poor                             ADL either performed or assessed with clinical judgement   ADL Overall ADL's : Needs assistance/impaired     Grooming: Maximal assistance Grooming Details (indicate cue type and reason): sitting at EOB with (A) for static balance Upper Body Bathing: Maximal assistance               Toilet Transfer: +2 for physical assistance;Moderate assistance Toilet Transfer Details (indicate cue type and reason): simulated EOB static standing           General ADL Comments: Pt motivated to participate.     Vision   Vision Assessment?: Vision impaired- to be further tested in functional context Additional Comments: pt able to track past midline. pt with head turns and movement so needs max cues to attempt to only get eye movement. Pt tracking in all quadrants   Perception     Praxis      Cognition Arousal/Alertness: Awake/alert Behavior During Therapy: WFL for tasks assessed/performed Overall Cognitive Status: Impaired/Different from baseline Area of Impairment: Attention                   Current Attention Level: Sustained                    Exercises Exercises: Other exercises Other Exercises Other Exercises: Pt lateral lean on R UE with pushing up into sitting activating R UE tricep   Shoulder  Instructions       General Comments BP supine 119/84 HR 120 sitting 107/66 HR 124 end of session 116/75 HR 135    Pertinent Vitals/ Pain       Pain Assessment: No/denies pain  Home Living                                          Prior Functioning/Environment              Frequency  Min 3X/week        Progress Toward Goals  OT Goals(current goals can now be found in the care plan section)  Progress towards OT goals: Goals drowngraded-see care plan  Acute Rehab OT Goals Patient Stated Goal: none stated OT Goal Formulation: With patient Time  For Goal Achievement: 03/04/21 Potential to Achieve Goals: Good ADL Goals Pt Will Perform Eating: with max assist;sitting Pt Will Perform Grooming: with max assist;sitting Pt Will Perform Upper Body Dressing: with max assist;sitting Pt Will Perform Lower Body Dressing: with max assist;sit to/from stand Pt Will Transfer to Toilet: with max assist;squat pivot transfer Additional ADL Goal #1: pt will demonstrate R UE AROM at the scapula with Max cues  Plan Discharge plan needs to be updated    Co-evaluation    PT/OT/SLP Co-Evaluation/Treatment: Yes Reason for Co-Treatment: Necessary to address cognition/behavior during functional activity;For patient/therapist safety;To address functional/ADL transfers   OT goals addressed during session: ADL's and self-care;Proper use of Adaptive equipment and DME;Strengthening/ROM      AM-PAC OT "6 Clicks" Daily Activity     Outcome Measure   Help from another person eating meals?: A Lot Help from another person taking care of personal grooming?: A Lot Help from another person toileting, which includes using toliet, bedpan, or urinal?: A Lot Help from another person bathing (including washing, rinsing, drying)?: A Lot Help from another person to put on and taking off regular upper body clothing?: A Lot Help from another person to put on and taking off regular lower body clothing?: A Lot 6 Click Score: 12    End of Session    OT Visit Diagnosis: Muscle weakness (generalized) (M62.81);Ataxia, unspecified (R27.0);Hemiplegia and hemiparesis Hemiplegia - Right/Left: Right Hemiplegia - dominant/non-dominant: Dominant Hemiplegia - caused by: Cerebral infarction   Activity Tolerance Patient tolerated treatment well   Patient Left in bed;with call bell/phone within reach;with bed alarm set   Nurse Communication Mobility status;Precautions        Time: 4975-3005 OT Time Calculation (min): 23 min  Charges: OT General Charges $OT Visit: 1  Visit OT Evaluation $OT Re-eval: 1 Re-eval   Brynn, OTR/L  Acute Rehabilitation Services Pager: [phone] Office: [phone] .    Jeri Modena 02/18/2021, 10:07 AM

## 2021-02-18 NOTE — Progress Notes (Signed)
Physical Therapy Treatment Patient Details Name: Juan Wheeler MRN: [id number] DOB: 02-11-1961 Today's Date: 02/18/2021    History of Present Illness 60 y.o. male presenting to ED on 5/21 with acute onset of diffuse weakness. MRI (+) multifocal acute ischemia within both hemispheres (L >R) with largest area at posterior L insula suggestive of embolic process in setting of A-fib. Patient also with occlusion of L vertebral artery. CT 5/23Areas of acute infarction on recent MRI are now partially visible on  CT.PMHx significant for lung cancer, rectal cancer, neuropathy, cardiomyopathy, A-fib and CAD/PAD.    PT Comments    Pt with persistent R sided hemiparesis (+ quad, hip flexor contraction), impaired sitting and standing balance, and activity tolerance today. Pt with + DBP orthostatic hypotension from supine>sit, improved upon return to supine. Pt overall requiring mod-max +2 for bed mobility, transfer to stand, and lateral steps this day. PT continuing to recommend CIR post-acutely.      Follow Up Recommendations  Supervision for mobility/OOB;CIR     Equipment Recommendations  Rolling walker with 5" wheels    Recommendations for Other Services       Precautions / Restrictions Precautions Precautions: Fall Precaution Comments: Monitor HR /watch BP Restrictions Weight Bearing Restrictions: No    Mobility  Bed Mobility Overal bed mobility: Needs Assistance Bed Mobility: Supine to Sit     Supine to sit: Mod assist;HOB elevated     General bed mobility comments: mod assist for progression of RLE to EOB, sequential scooting to EOB. Bed exit towards pt's L, verbal cuing for sequencing task and safe placement of RUE.    Transfers Overall transfer level: Needs assistance Equipment used: 2 person hand held assist Transfers: Sit to/from Stand Sit to Stand: +2 physical assistance;Mod assist         General transfer comment: Mod +2 for initial power up, rise, steadying, RLE  close guard with blacking during dynamic activity.  Ambulation/Gait Ambulation/Gait assistance: +2 physical assistance;Max assist Gait Distance (Feet): 2 Feet Assistive device: 2 person hand held assist Gait Pattern/deviations: Step-to pattern;Decreased weight shift to right;Decreased stance time - right;Trunk flexed     General Gait Details: Pt able to take 2 steps towards [facility] with max +2 assist for weight shifting L/R, RLE blocking during stance phase, steadying.   Stairs             Wheelchair Mobility    Modified Rankin (Stroke Patients Only) Modified Rankin (Stroke Patients Only) Pre-Morbid Rankin Score: No symptoms Modified Rankin: Moderately severe disability     Balance Overall balance assessment: Needs assistance Sitting-balance support: Single extremity supported;Feet supported Sitting balance-Leahy Scale: Fair     Standing balance support: Single extremity supported;During functional activity Standing balance-Leahy Scale: Poor                              Cognition Arousal/Alertness: Awake/alert Behavior During Therapy: WFL for tasks assessed/performed Overall Cognitive Status: Impaired/Different from baseline Area of Impairment: Attention                   Current Attention Level: Sustained           General Comments: Pt answering questions appropriately, requiring increased processing time to do so. Intermittent slurred speech and difficulty with word finding      Exercises Other Exercises Other Exercises: Pt lateral lean on R UE with pushing up into sitting activating R UE tricep, PT assist for keeping R  hand flat on bed during tricep pushup    General Comments General comments (skin integrity, edema, etc.): BP and HR supine 119/84 120, BP and HR sitting 107/66 124, BP and HR end of session 116/75 135      Pertinent Vitals/Pain Pain Assessment: No/denies pain Faces Pain Scale: No hurt Pain Intervention(s): Monitored  during session    Home Living                      Prior Function            PT Goals (current goals can now be found in the care plan section) Acute Rehab PT Goals Patient Stated Goal: get better PT Goal Formulation: With patient/family Time For Goal Achievement: 03/01/21 Potential to Achieve Goals: Good Progress towards PT goals: Progressing toward goals    Frequency    Min 4X/week      PT Plan Current plan remains appropriate    Co-evaluation PT/OT/SLP Co-Evaluation/Treatment: Yes Reason for Co-Treatment: For patient/therapist safety;To address functional/ADL transfers PT goals addressed during session: Mobility/safety with mobility;Balance OT goals addressed during session: ADL's and self-care;Proper use of Adaptive equipment and DME;Strengthening/ROM      AM-PAC PT "6 Clicks" Mobility   Outcome Measure  Help needed turning from your back to your side while in a flat bed without using bedrails?: A Lot Help needed moving from lying on your back to sitting on the side of a flat bed without using bedrails?: A Lot Help needed moving to and from a bed to a chair (including a wheelchair)?: A Lot Help needed standing up from a chair using your arms (e.g., wheelchair or bedside chair)?: A Lot Help needed to walk in hospital room?: A Lot Help needed climbing 3-5 steps with a railing? : Total 6 Click Score: 11    End of Session Equipment Utilized During Treatment: Gait belt Activity Tolerance: Patient limited by fatigue Patient left: in bed;with family/visitor present;with call bell/phone within reach Nurse Communication: Mobility status PT Visit Diagnosis: Other abnormalities of gait and mobility (R26.89);Muscle weakness (generalized) (M62.81);Difficulty in walking, not elsewhere classified (R26.2)     Time: 2585-2778 PT Time Calculation (min) (ACUTE ONLY): 24 min  Charges:  $Therapeutic Activity: 8-22 mins                     Stacie Glaze, PT DPT Acute  Rehabilitation Services Pager [phone]  Office [phone]   Juan Wheeler 02/18/2021, 11:23 AM

## 2021-02-18 NOTE — Plan of Care (Signed)

## 2021-02-18 NOTE — Progress Notes (Addendum)
STROKE TEAM PROGRESS NOTE   INTERVAL HISTORY He is awake and interactive.  Dysarthric.  Left gaze preference. Unable to move RUE. RLE 2/5 following commands.  Full strength on the left.  MRI scan of the brain shows bilateral embolic looking infarcts in anterior and posterior circulation more on the right than the left normal cortex and subcortical regions.  Patient states he had been compliant with his Eliquis and not missed any dose.  Echocardiogram done on 01/17/2021 shows diminished ejection fraction of 30 to 35% but no clot.  LDL cholesterol is 99 mg percent.  Hemoglobin A1c 6.8. Vitals:   02/18/21 0325 02/18/21 0829 02/18/21 1128 02/18/21 1246  BP: (!) 139/91 119/84 (!) 126/95   Pulse: 69 94 (!) 109 (!) 125  Resp: 20 18 15 15   Temp: (!) 97.5 F (36.4 C) (!) 97.5 F (36.4 C) 97.7 F (36.5 C) 97.7 F (36.5 C)  TempSrc: Oral Oral Oral Oral  SpO2: 93% 93% 98%   Weight:      Height:       CBC:  Recent Labs  Lab 02/15/21 0833 02/15/21 0912 02/17/21 0530 02/18/21 0139  WBC 7.3   < > 5.8 6.8  NEUTROABS 5.8  --   --   --   HGB 15.1   < > 15.2 16.1  HCT 45.0   < > 45.1 46.6  MCV 101.1*   < > 98.5 97.1  PLT 134*   < > 125* 128*   < > = values in this interval not displayed.   Basic Metabolic Panel:  Recent Labs  Lab 02/16/21 0445 02/17/21 0530 02/18/21 0139  NA 137 136 136  K 4.0 4.2 4.1  CL 106 104 102  CO2 26 26 24   GLUCOSE 88 94 101*  BUN 7 9 7   CREATININE 1.12 1.18 1.08  CALCIUM 8.6* 8.7* 9.0  MG 1.8  --   --    Lipid Panel:  Recent Labs  Lab 02/16/21 0445  CHOL 158  TRIG 73  HDL 44  CHOLHDL 3.6  VLDL 15  LDLCALC 99   HgbA1c:  Recent Labs  Lab 02/16/21 0445  HGBA1C 6.8*   Urine Drug Screen:  Recent Labs  Lab 02/15/21 0955  LABOPIA NONE DETECTED  COCAINSCRNUR NONE DETECTED  LABBENZ NONE DETECTED  AMPHETMU NONE DETECTED  THCU POSITIVE*  LABBARB NONE DETECTED    Alcohol Level  Recent Labs  Lab 02/15/21 0833  ETH <10    IMAGING past 24  hours CT HEAD WO CONTRAST  Result Date: 02/17/2021  IMPRESSION: Areas of acute infarction on recent MRI are now partially visible on CT. No evidence of hemorrhagic transformation.   Echo Result date 01/17/2021 IMPRESSIONS  1. Left ventricular ejection fraction, by estimation, is 30 to 35%. The  left ventricle has moderately decreased function. The left ventricle  demonstrates global hypokinesis with some regional variation. Left  ventricular diastolic parameters are  indeterminate in the setting of atrial fibrillation.  2. Right ventricular systolic function is moderately reduced. The right  ventricular size is normal. Tricuspid regurgitation signal is inadequate  for assessing PA pressure.  3. Left atrial size was mildly dilated.  4. The mitral valve is grossly normal. Mild mitral valve regurgitation.  5. The aortic valve was not well visualized. There is mild calcification  of the aortic valve. Aortic valve regurgitation is not visualized.  6. The inferior vena cava is normal in size with greater than 50%  respiratory variability, suggesting right atrial pressure  of 3 mmHg.   MRI Cspine  Result date: 02/16/2021 IMPRESSION: 1. Abnormal left vertebral artery flow void consistent with occlusion or high-grade stenosis. 2. Mild spinal canal and bilateral neural foraminal stenosis at C5-C6. 3. Mild left C2-3, left C3-4 and left C6-7 neural foraminal Stenosis.  MRA head/neck; MR brain Result read 02/15/2021  IMPRESSION: 1. Multifocal acute ischemia within both hemispheres, left-greater-than-right, and multiple vascular territories. The largest area of abnormality is at the posterior left insula. The pattern is most suggestive of an embolic process, particularly in a patient with atrial fibrillation. 2. Occlusion of the left vertebral artery with distal reconstitution due to flow across the vertebrobasilar confluence. 3. Normal intracranial MRA  CT head code stroke Result  read: 02/15/2021 IMPRESSION: 1. No acute cortically based infarct or acute intracranial hemorrhage identified. ASPECTS 10. 2. Coarse chronic dural calcification along the left hemisphere, perhaps sequelae of a remote subdural hematoma or infection. Chronic white matter disease.  PHYSICAL EXAM General:  Pleasantly resting in bed, No acute distress. HEENT:  Normocephalic atraumatic.  Sclerae nonicteric, noninjected.  Extraocular movements intact bilaterally. Lungs:  Clear to auscultate bilaterally without rhonchi, wheeze, or rales. Heart:  Irregular rate and rhythm.   Abdomen:  Soft, nontender, nondistended.  Without guarding or rebound. Vascular:  Dorsalis pedis and posterior tibial pulses palpable bilaterally. Skin:  Warm and dry, no erythema, no ulcerations.  Neurological Exam : Patient is awake alert and cooperative.  He is oriented to time place and person.  He has moderate dysarthria.  He has left gaze preference but can look all the way to the right.  Blinks to threat bilaterally.  Right lower facial weakness.  Tongue midline.  Dense right hemiplegia with right upper extremity 0/5 strength in right lower extremity 1-2/5 strength with diminished tone.  Good antigravity strength on the left.  Sensation intact bilaterally.  Gait not tested.  ASSESSMENT/PLAN [name] is a 60 y.o. male with history of squamous cell carcinoma of the lung, rectal adenocarcinoma,neuropathy, cardiomyopathy with LVEF 30-35%, bilateral PE on Eliquis, atrial fibrillation, and CAD/PAD who presented to the ED after acute onset of diffuse weakness at home.  Stroke: multifocal acute ischemia within both hemispheres -concerning for embolic origin, most likely A. fib given his history; occlusion of the left vertebral artery with distal reconstitution  Code Stroke: CT head No acute cortically based infarct or acute intracranial  hemorrhage. ASPECTS 10  MR brain: Multifocal acute ischemia within both  hemispheres, left-greater-than-right  MRA head/neck:  multifocal acute ischemia within both hemispheres  2D Echo (01/17/2021): EF 30-35%, left ventricle with global hypokinesis  LDL 99  HgbA1c 6.8  VTE prophylaxis - SCDs  Diet: heart healthy  aspirin 81 mg daily and Eliquis (apixaban) daily prior to admission, now on aspirin 81 mg daily and Pradaxa (dabigatran) twice a day.   Therapy recommendations:  CIR  Disposition:  pending  Hypertension  Home meds:  imdur 15mg  daily, cozaar 12.5mg  daily, Toprol XL 25mg  daily  Increase Toprol XL to 50mg  daily  Stable . SBP goal <180 given his low EF . Long-term BP goal normotensive  Hyperlipidemia  Home meds:  none  LDL 99, goal < 70  Simvastatin 40g daily  Continue statin at discharge  Diabetes type II Controlled  Home meds:  none  HgbA1c 6.8, goal < 7.0  CBGs  No results for input(s): GLUCAP in the last 72 hours.   SSI  Atrial Fibrillations  Takes Eliquis 5mg  bid at home  Other Stroke  Risk Factors  Cigarette smoker. Smokes tobacco with a 10 pack year history. Uses cannabis. Advised to stop smoking  ETOH use, alcohol level <10, advised to drink no more than 1 drink a day  Substance abuse - UDS:  THC POSITIVE, Cocaine NONE DETECTED. Patient advised to stop using due to stroke risk.  Coronary artery disease: 03/2013 abnormal nuclear study, left heart cath s/p DES to LCx, residual moderate disease in LAD  Congestive heart failure: LV dysfunction EF 45% in 2015  Other Active Problems  PAD  GERD: takes omeprazole at home  DVT in 2013  Colon/rectal cancer  Chronic back pain: take Norco and percocet prn  Hospital day # 3 I have personally obtained history,examined this patient, reviewed notes, independently viewed imaging studies, participated in medical decision making and plan of care.ROS completed by me personally and pertinent positives fully documented  I have made any additions or clarifications  directly to the above note. Agree with note above.  Patient presented with dysarthria and right hemiplegia secondary to multifocal bilateral anterior and posterior circulation embolic infarcts likely from his atrial fibrillation despite being on anticoagulation with Eliquis which he states he was compliant with.  Recommend consider changing Eliquis to Pradaxa if patient's insurance co-pay does not significantly go up.  Continue ongoing stroke work-up and aggressive risk factor modification.  Physical Occupational Therapy and rehab consults.  No family available at the bedside for discussion.  Discussed with Dr. Avon Gully.  Greater than 50% time during this 35-minute visit was spent in counseling and coordination of care about his embolic strokes and answering questions about his care.  Stroke team will sign off.  Kindly call for questions  Antony Contras, MD  Antony Contras, MD Medical Director [facility] Pager: [phone] 02/18/2021 3:22 PM   To contact Stroke Continuity provider, please refer to [email or web address] After hours, contact General Neurology

## 2021-02-18 NOTE — Evaluation (Signed)
Speech Language Pathology Evaluation Patient Details Name: Juan Wheeler MRN: [id number] DOB: 1960/12/17 Today's Date: 02/18/2021 Time: 4098-1191 SLP Time Calculation (min) (ACUTE ONLY): 25 min  Problem List:  Patient Active Problem List   Diagnosis Date Noted  . TIA (transient ischemic attack) 02/15/2021  . Hypoalbuminemia due to protein-calorie malnutrition ([place]) 01/16/2021  . Elevated MCV 01/16/2021  . Atrial fibrillation with rapid ventricular response ([place])   . Malignant neoplasm of lung ([place])   . Pulmonary embolism ([place]) 01/15/2021  . Hepatic metastasis ([place]) 01/15/2021  . Elevated troponin I level 01/15/2021  . Pleural effusion on right 01/15/2021  . Neutropenia, drug-induced ([place]) 07/25/2020  . Port-A-Cath in place 05/30/2020  . Squamous cell lung cancer, right ([place]) 05/22/2020  . History of DVT (deep vein thrombosis) 07/08/2016  . Paroxysmal atrial fibrillation ([place]) 07/08/2016  . SOB (shortness of breath) 07/08/2016  . Sepsis secondary to UTI ([place]) 07/08/2016  . Hypokalemia   . Gastroesophageal reflux disease   . Coronary artery disease involving native coronary artery of native heart with angina pectoris ([place])   . AKI (acute kidney injury) ([place])   . Fournier's gangrene in male 06/05/2016  . Abscess 06/05/2016  . Sepsis ([place])   . Necrotizing fasciitis ([place])   . Central venous catheter in place   . Acute respiratory failure ([place])   . PAD (peripheral artery disease) ([place]) 07/02/2015  . Preoperative cardiovascular examination 06/12/2015  . Cardiomyopathy, ischemic 06/12/2015  . Pulmonary nodules 09/30/2014  . ASCVD (arteriosclerotic cardiovascular disease) 05/02/2013  . Unstable angina ([place]) 04/18/2013  . Chest pain 03/29/2013  . Tobacco abuse 03/29/2013  . Chronic pain syndrome 11/09/2012  . Pain in joint, pelvic region and thigh 11/09/2012  . Neuralgia and neuritis 10/12/2012  . Atherosclerosis of native arteries of extremity with intermittent claudication  ([place]) 08/19/2012  . Backache 03/24/2012  . Peripheral vascular disease ([place]) 12/04/2011  . Compression of vein 12/04/2011  . Heartburn 12/03/2011  . Personal history of digestive disease 11/19/2011  . Depressive disorder 09/24/2011  . Dysuria 08/31/2011  . Impotence of organic origin 08/31/2011  . Urinary frequency 08/31/2011  . Constipation 06/05/2011  . Essential hypertension 06/05/2011  . Rectal cancer metastasized to lung [facility]) 12/02/2010   Past Medical History:  Past Medical History:  Diagnosis Date  . Chronic lower back pain    a. Follow[facility] at [facility].  . Colon cancer [facility])    rectal cancer  . Coronary artery disease    a. 03/2013: abnl nuc -> LHC s/p DES to LCx, residual moderate disease in LAD (med rx unless refractory angina). b. Not on BB due to bradycardia.  Marland Kitchen DVT (deep venous thrombosis) ([place]) ~ 2013  . Dysrhythmia    AFib  . GERD (gastroesophageal reflux disease)   . H/O necrotising fasciitis   . History of blood transfusion    "once; after throwing up alot of blood" (04/17/2013)  . Hypertension   . LV dysfunction    a. EF 45% in 03/2013.  Marland Kitchen PAD (peripheral artery disease) ([place])    a. Occlusion of the right internal iliac artery, with significant atherosclerosis in the left internal iliac which was not amenable to reconstruction per notes from [place] in place 05/30/2020  . Pulmonary nodules 09/30/2014  . Rectal cancer ([place])   . Tobacco abuse    Past Surgical History:  Past Surgical History:  Procedure Laterality Date  . ABDOMINAL SURGERY  1990's   'for stomach ulcers" (04/17/2013)  .  COLECTOMY  2012   "for rectal cancer" (04/17/2013)  . COLONOSCOPY  2013   Dr. Cheryll Cockayne: colorectal anastomosis with ulcer and inflammation, benign biopsy  . COLONOSCOPY WITH PROPOFOL N/A 09/07/2016   Procedure: COLONOSCOPY WITH PROPOFOL;  Surgeon: Daneil Dolin, MD;  [facility];  Service: Endoscopy;  Laterality: N/A;   10:00 am Colonoscopy via rectum and ostomy  . COLOSTOMY TAKEDOWN  2013  . CORONARY ANGIOPLASTY WITH STENT PLACEMENT  04/17/2013   "?1" (04/17/2013)  . FEMORAL-POPLITEAL BYPASS GRAFT Left 07/02/2015   Procedure: BYPASS GRAFT LEFT COMMON FEMORAL ARTERY TO LEFT ABOVE KNEE POPLITEAL ARTERY - USING LEFT GREATER SAPPHENOUS VEIN;  Surgeon: Elam Dutch, MD;  Location: [place];  Service: Vascular;  Laterality: Left;  . INCISION AND DRAINAGE ABSCESS Left 06/05/2016   Procedure: INCISION AND DRAINAGE ABSCESS;  Surgeon: Aviva Signs, MD;  Location: [facility] ORS;  Service: General;  Laterality: Left;  . INCISION AND DRAINAGE PERIRECTAL ABSCESS Left 06/07/2016   Procedure: IRRIGATION AND DEBRIDEMENT LEFT BUTTOCK ABSCESS;  Surgeon: Greer Pickerel, MD;  Location: [place];  Service: General;  Laterality: Left;  . INGUINAL HERNIA REPAIR Bilateral 1990's  . LAPAROSCOPIC PARTIAL COLECTOMY N/A 06/11/2016   Procedure: LAPAROSCOPIC  OPEN COLOSTOMY;  Surgeon: [name], MD;  Location: [place];  Service: General;  Laterality: N/A;  . LEFT HEART CATHETERIZATION WITH CORONARY ANGIOGRAM N/A 04/17/2013   Procedure: LEFT HEART CATHETERIZATION WITH CORONARY ANGIOGRAM;  Surgeon: Peter M Martinique, MD;  Location: [facility] CATH LAB;  Service: Cardiovascular;  Laterality: N/A;  . PERCUTANEOUS STENT INTERVENTION  04/17/2013   Procedure: PERCUTANEOUS STENT INTERVENTION;  Surgeon: Peter M Martinique, MD;  Location: [facility] CATH LAB;  Service: Cardiovascular;;  . PERIPHERAL VASCULAR CATHETERIZATION N/A 06/14/2015   Procedure: Abdominal Aortogram;  Surgeon: Elam Dutch, MD;  Location: [place] CV LAB;  Service: Cardiovascular;  Laterality: N/A;  . PERIPHERAL VASCULAR CATHETERIZATION Bilateral 06/14/2015   Procedure: Lower Extremity Angiography;  Surgeon: Elam Dutch, MD;  Location: [place] CV LAB;  Service: Cardiovascular;  Laterality: Bilateral;  . PORTACATH PLACEMENT Left 06/05/2020   Procedure: INSERTION PORT-A-CATH;  Surgeon: Aviva Signs, MD;   Location: [facility];  Service: General;  Laterality: Left;  Marland Kitchen VEIN HARVEST Left 07/02/2015   Procedure: VEIN HARVEST - LEFT GREATER SAPPHENOUS VEIN;  Surgeon: Elam Dutch, MD;  Location: [facility] OR;  Service: Vascular;  Laterality: Left;   HPI:  60 y.o. male presenting to ED on 5/21 with acute onset of diffuse weakness. MRI (+) multifocal acute ischemia within both hemispheres (L >R) with largest area at posterior L insula suggestive of embolic process in setting of A-fib. Patient also with occlusion of L vertebral artery. CT 5/23Areas of acute infarction on recent MRI are now partially visible on  CT.PMHx significant for lung cancer, rectal cancer, neuropathy, cardiomyopathy, A-fib and CAD/PAD.   Assessment / Plan / Recommendation Clinical Impression  Pt presents with mild to moderate deficits in cognition, dysarthria of speech, and mild anomic aphasia. Pt affirms changes in thinking skills and speech. PLOF pt lives with spouse in mobile home. He states spouse works full time and that he is on disability. He does not drive and he and wife take care of medicines and finances together. Current deficits in cognitive abilities include some temporal disorientation, reduced executive function skills, decreased recall, slowed thought organization, and reduced problem solving. Pt appears HOH that impacted his ability to accurately comprehend biographical information questions. Dysarthria of speech includes reduced articulation of sounds,  decreased vocal intensity, and hypernasality at times impairing communication intellgibility. Pt with anomic aphasia, he states he has novel word finding difficulties but remains heavily fluent with language. SLP to follow up for cognitive linguistic deficits.    SLP Assessment  SLP Recommendation/Assessment: Patient needs continued Speech Lanaguage Pathology Services SLP Visit Diagnosis: Aphasia (R47.01);Dysarthria and anarthria (R47.1);Cognitive communication deficit (R41.841)     Follow Up Recommendations  Inpatient Rehab;Home health SLP (per PT/OT recommendations)    Frequency and Duration min 1 x/week  2 weeks      SLP Evaluation Cognition  Overall Cognitive Status: Impaired/Different from baseline Arousal/Alertness: Awake/alert Orientation Level: Oriented to person;Oriented to place;Disoriented to time Attention: Focused;Sustained Focused Attention: Impaired Focused Attention Impairment: Verbal complex;Functional complex Sustained Attention: Impaired Sustained Attention Impairment: Verbal complex;Functional complex Memory: Impaired Memory Impairment: Decreased recall of new information;Decreased short term memory Awareness: Impaired Problem Solving: Impaired Problem Solving Impairment: Verbal complex;Functional complex Executive Function: Organizing;Self Monitoring Organizing: Impaired Self Monitoring: Impaired Safety/Judgment: Impaired       Comprehension  Auditory Comprehension Overall Auditory Comprehension: Impaired Yes/No Questions: Impaired Commands: Impaired Interfering Components: Hearing EffectiveTechniques: Extra processing time;Increased volume;Slowed speech;Repetition    Expression Expression Primary Mode of Expression: Verbal Verbal Expression Overall Verbal Expression: Impaired Initiation: No impairment Level of Generative/Spontaneous Verbalization: Sentence Naming: Impairment Pragmatics: Impairment Impairments: Monotone Written Expression Dominant Hand: Right   Oral / Motor  Oral Motor/Sensory Function Overall Oral Motor/Sensory Function: Within functional limits Motor Speech Overall Motor Speech: Impaired Respiration: Impaired Level of Impairment: Word Phonation: Normal Resonance: Hypernasality Articulation: Impaired Level of Impairment: Word Intelligibility: Intelligibility reduced Word: 75-100% accurate Phrase: 50-74% accurate Sentence: 50-74% accurate Motor Planning: Witnin functional limits Effective  Techniques: Slow rate;Increased vocal intensity;Over-articulate   [place], CCC-SLP Acute Rehabilitation Services   02/18/2021, 3:20 PM

## 2021-02-18 NOTE — Care Management Important Message (Signed)
Important Message  Patient Details  Name: Juan Wheeler MRN: [id number] Date of Birth: [birth year]   Medicare Important Message Given:  Yes     Giulia Hickey [name] 02/18/2021, 4:01 PM

## 2021-02-19 DIAGNOSIS — I4891 Unspecified atrial fibrillation: Secondary | ICD-10-CM

## 2021-02-19 DIAGNOSIS — I2510 Atherosclerotic heart disease of native coronary artery without angina pectoris: Secondary | ICD-10-CM | POA: Diagnosis not present

## 2021-02-19 DIAGNOSIS — M549 Dorsalgia, unspecified: Secondary | ICD-10-CM

## 2021-02-19 DIAGNOSIS — C3490 Malignant neoplasm of unspecified part of unspecified bronchus or lung: Secondary | ICD-10-CM

## 2021-02-19 DIAGNOSIS — I5042 Chronic combined systolic (congestive) and diastolic (congestive) heart failure: Secondary | ICD-10-CM | POA: Diagnosis not present

## 2021-02-19 DIAGNOSIS — G8929 Other chronic pain: Secondary | ICD-10-CM

## 2021-02-19 DIAGNOSIS — I2699 Other pulmonary embolism without acute cor pulmonale: Secondary | ICD-10-CM

## 2021-02-19 DIAGNOSIS — I749 Embolism and thrombosis of unspecified artery: Secondary | ICD-10-CM | POA: Diagnosis not present

## 2021-02-19 DIAGNOSIS — I10 Essential (primary) hypertension: Secondary | ICD-10-CM

## 2021-02-19 DIAGNOSIS — F1210 Cannabis abuse, uncomplicated: Secondary | ICD-10-CM

## 2021-02-19 DIAGNOSIS — C20 Malignant neoplasm of rectum: Secondary | ICD-10-CM

## 2021-02-19 DIAGNOSIS — Z720 Tobacco use: Secondary | ICD-10-CM

## 2021-02-19 LAB — BASIC METABOLIC PANEL
Anion gap: 10 (ref 5–15)
BUN: 10 mg/dL (ref 6–20)
CO2: 24 mmol/L (ref 22–32)
Calcium: 9.2 mg/dL (ref 8.9–10.3)
Chloride: 103 mmol/L (ref 98–111)
Creatinine, Ser: 1.2 mg/dL (ref 0.61–1.24)
GFR, Estimated: >60 mL/min (ref >60)
Glucose, Bld: 100 mg/dL — ABNORMAL HIGH (ref 70–99)
Potassium: 4.4 mmol/L (ref 3.5–5.1)
Sodium: 137 mmol/L (ref 135–145)

## 2021-02-19 LAB — CBC
HCT: 47.9 % (ref 39.0–52.0)
Hemoglobin: 16.5 g/dL (ref 13.0–17.0)
MCH: 33.3 pg (ref 26.0–34.0)
MCHC: 34.4 g/dL (ref 30.0–36.0)
MCV: 96.6 fL (ref 80.0–100.0)
Platelets: 110 10*3/uL — ABNORMAL LOW (ref 150–400)
RBC: 4.96 MIL/uL (ref 4.22–5.81)
RDW: 15.4 % (ref 11.5–15.5)
WBC: 6.7 10*3/uL (ref 4.0–10.5)
nRBC: 0 % (ref 0.0–0.2)

## 2021-02-19 NOTE — Progress Notes (Addendum)
Inpatient Rehabilitation Admissions Coordinator  Inpatient rehab consult received. I met with patient at bedside for assessment. He gave me permission and I left a voicemail for his wife to call me to discuss rehab venue options and caregiver supports available.  Danne Baxter, RN, MSN Rehab Admissions Coordinator [phone] 02/19/2021 2:17 PM   Patient's wife returned my call. We discussed the goals and expectations of a possible CIR admit. She is to check with family to clarify caregiver supports. Tomorrow I will begin insurance Auth with [facility] Medicare for a possible Cir admit. Wife prefers CIR rather than SNF.  Danne Baxter, RN, MSN Rehab Admissions Coordinator [phone] 02/19/2021 6:42 PM

## 2021-02-19 NOTE — Progress Notes (Signed)
Physical Therapy Treatment Patient Details Name: Juan Wheeler MRN: [id number] DOB: 03/07/1961 Today's Date: 02/19/2021    History of Present Illness 60 y.o. male presenting to ED on 5/21 with acute onset of diffuse weakness. MRI (+) multifocal acute ischemia within both hemispheres (L >R) with largest area at posterior L insula suggestive of embolic process in setting of A-fib. Patient also with occlusion of L vertebral artery. CT 5/23Areas of acute infarction on recent MRI are now partially visible on  CT.PMHx significant for lung cancer, rectal cancer, neuropathy, cardiomyopathy, A-fib and CAD/PAD.    PT Comments    Pt resting in bed upon PT arrival, presents with flat affect. Pt requiring light assist only to move to EOB today, demonstrating improved weight shifting in sitting tasks to maintain balance. Pt continues to require max +2 assist for standing tasks, tolerated x3 ft gait today but with max facilitation and cuing for truncal weight shift to L, progressing and supporting RLE. PT continuing to recommend CIR post-acutely.    Follow Up Recommendations  Supervision for mobility/OOB;CIR     Equipment Recommendations  Rolling walker with 5" wheels    Recommendations for Other Services       Precautions / Restrictions Precautions Precautions: Fall Precaution Comments: Monitor HR /watch BP Restrictions Weight Bearing Restrictions: No    Mobility  Bed Mobility Overal bed mobility: Needs Assistance Bed Mobility: Supine to Sit     Supine to sit: Min assist     General bed mobility comments: min assist for completion of trunk elevation, RLE management. Exited bed towards R    Transfers Overall transfer level: Needs assistance Equipment used: 2 person hand held assist Transfers: Sit to/from Stand Sit to Stand: Mod assist;+2 physical assistance         General transfer comment: mod +2 for power up, rise, steadying, RLE blocking to prevent severe buckling. Max  tactile and verbal cuing to shift weight to L side, pt with preference for R lateral leaning. STS x2  Ambulation/Gait Ambulation/Gait assistance: +2 physical assistance;Max assist Gait Distance (Feet): 3 Feet Assistive device: 2 person hand held assist Gait Pattern/deviations: Step-to pattern;Decreased stance time - right;Trunk flexed;Decreased step length - right Gait velocity: decr   General Gait Details: Max +2 for truncal support, correcting RLE buckling in stance, facilitiating weight shift L/R, and progressing RLE during swing phase of gait. Multiple episodes of buckling RLE corrected by PT given pt's prefernce for R truncal lean   Stairs             Wheelchair Mobility    Modified Rankin (Stroke Patients Only) Modified Rankin (Stroke Patients Only) Pre-Morbid Rankin Score: No symptoms Modified Rankin: Moderately severe disability     Balance Overall balance assessment: Needs assistance Sitting-balance support: Single extremity supported;Feet supported Sitting balance-Leahy Scale: Fair     Standing balance support: Single extremity supported;During functional activity Standing balance-Leahy Scale: Poor Standing balance comment: max +2 assist                            Cognition Arousal/Alertness: Awake/alert Behavior During Therapy: WFL for tasks assessed/performed Overall Cognitive Status: Impaired/Different from baseline Area of Impairment: Attention;Following commands                   Current Attention Level: Sustained   Following Commands: Follows one step commands consistently       General Comments: Requires max cuing for positioning and safety during mobility, lacks  body awareness and orientation in space      Exercises Other Exercises Other Exercises: bridge x5, with PT supporting RLE in neutral positioning and maintaining hip alignment/lift. + R glute activation    General Comments General comments (skin integrity, edema,  etc.): HR 127-160 bpm, RN notified and in room at end of PT session. SBP 129 post-short distance gait and transfer to recliner      Pertinent Vitals/Pain Pain Assessment: Faces Faces Pain Scale: No hurt Pain Intervention(s): Limited activity within patient's tolerance;Monitored during session    Home Living                      Prior Function            PT Goals (current goals can now be found in the care plan section) Acute Rehab PT Goals Patient Stated Goal: get better PT Goal Formulation: With patient/family Time For Goal Achievement: 03/01/21 Potential to Achieve Goals: Good Progress towards PT goals: Progressing toward goals    Frequency    Min 4X/week      PT Plan Current plan remains appropriate    Co-evaluation              AM-PAC PT "6 Clicks" Mobility   Outcome Measure  Help needed turning from your back to your side while in a flat bed without using bedrails?: A Lot Help needed moving from lying on your back to sitting on the side of a flat bed without using bedrails?: A Lot Help needed moving to and from a bed to a chair (including a wheelchair)?: A Lot Help needed standing up from a chair using your arms (e.g., wheelchair or bedside chair)?: A Lot Help needed to walk in hospital room?: A Lot Help needed climbing 3-5 steps with a railing? : Total 6 Click Score: 11    End of Session Equipment Utilized During Treatment: Gait belt Activity Tolerance: Patient limited by fatigue Patient left: in bed;with family/visitor present;with call bell/phone within reach Nurse Communication: Mobility status PT Visit Diagnosis: Other abnormalities of gait and mobility (R26.89);Muscle weakness (generalized) (M62.81);Difficulty in walking, not elsewhere classified (R26.2)     Time: 4315-4008 PT Time Calculation (min) (ACUTE ONLY): 23 min  Charges:  $Therapeutic Activity: 8-22 mins $Neuromuscular Re-education: 8-22 mins                     Stacie Glaze,  PT DPT Acute Rehabilitation Services Pager [phone]  Office [phone]    Roxine Caddy E Ruffin Pyo 02/19/2021, 1:03 PM

## 2021-02-19 NOTE — Progress Notes (Signed)
PROGRESS NOTE    Juan Wheeler  YWV:[id number] DOB: July 02, 1961 DOA: 02/15/2021 PCP: [name], MD   Brief Narrative:  HPI on 02/15/21 by Dr. Heath Lark Juan Wheeler is a 60 y.o. male with medical history significant for squamous cell carcinoma of the lung, rectal adenocarcinoma, neuropathy, cardiomyopathy with LVEF 30-35%, bilateral PE on Eliquis, atrial fibrillation, and CAD/PAD who presented to the ED this morning after acute onset of diffuse weakness at home.  His last known well around 6:30 AM and symptoms began shortly after that.  He tried to use his urine with his right hand but it fell out of his hands and when he tried to pick it up from the floor with his other hand and he dropped it again.  His legs then became weak after he went to his couch.  He tried to get up but he could not do so on account of the weakness.  After few minutes of this he called his wife and he was noted to have a blank look on his face.  His wife called EMS and he was brought to the ED for further evaluation.  He states he has been compliant with his medications to include Eliquis daily.  He denies any fevers, chills, chest pain, shortness of breath, palpitations, abdominal pain, nausea, or vomiting.  He denies headache or vision changes.  Interim history Patient mated with acute CVA and profound weakness with amatory dysfunction.  Neurology was consulted.  Patient was placed on aspirin and Pradaxa.  Currently pending placement, either CIR or SNF. Assessment & Plan   Acute CVA -Patient presented with diffuse weakness -Concern for embolic origin given A. fib.-CT head showed no acute intracranial infarct or hemorrhage -MRI brain showed multifocal acute ischemia within both hemispheres, left greater than right, multiple vascular territories, largest area of abnormality left posterior insula.   -MRA brain and neck: occlusion of the left vertebral artery.  Otherwise normal intracranial MRA -Previous  echocardiogram on 01/17/2021 showed an EF of 30 to 35%, global hypokinesis with some regional variation, LV diastolic parameters indeterminate in the setting of A. Fib. -Hemoglobin A1c 6.8, LDL 99 -Continue statin, aspirin -Neurology consulted and appreciated-patient started on Pradaxa -PT and OT recommending CIR  Atrial fibrillation, chronic -Patient was on Eliquis prior to admission however given acute CVA, question compliance versus ineffectiveness -Continue Pradaxa -Continue metoprolol  Essential hypertension -Initially allowed for permissive hypertension -Continue metoprolol and PRN metoprolol for HR >120 -Cozaar and Imdur currently held  CAD/PAD -Currently stable, no complaints of chest pain -Continue metoprolol, aspirin, statin  History of rectal adenocarcinoma/squamous cell lung cancer -Follow-up with oncology as an outpatient  Marijuana use -Noted on urine drug screen -Counseled   DVT Prophylaxis Pradaxa  Code Status: Full  Family Communication: None at bedside  Disposition Plan:  Status is: Inpatient  Remains inpatient appropriate because:Hemodynamically unstable and Inpatient level of care appropriate due to severity of illness   Dispo: The patient is from: Home              Anticipated d/c is to: CIR              Patient currently is not medically stable to d/c.   Difficult to place patient No   Consultants Neurology Inpatient rehab  Procedures  None  Antibiotics   Anti-infectives (From admission, onward)   None      Subjective:   Juan Wheeler seen and examined today.  Patient with no complaints this morning.  Continues to have right-sided weakness.  Denies current chest pain, shortness of breath, abdominal pain, nausea or vomiting, diarrhea or constipation.  Complains of feeling very tired and sleepy today.   Objective:   Vitals:   02/18/21 2318 02/19/21 0354 02/19/21 0724 02/19/21 0900  BP: (!) 127/103 (!) 117/93 122/76   Pulse: 86 66  63 (!) 118  Resp: 20 18 16    Temp: (!) 97.5 F (36.4 C) 97.7 F (36.5 C) 97.7 F (36.5 C)   TempSrc: Oral Oral Oral   SpO2: 93% 93% 95%   Weight:      Height:        Intake/Output Summary (Last 24 hours) at 02/19/2021 1829 Last data filed at 02/19/2021 9371 Gross per 24 hour  Intake 100 ml  Output 775 ml  Net -675 ml   Filed Weights   02/15/21 0815  Weight: 71.2 kg    Exam  General: Well developed, well nourished, NAD, appears older than stated age  60: NCAT, PERRLA, EOMI, Anicteic Sclera, mucous membranes moist.   Neck: Supple  Cardiovascular: S1 S2 auscultated, irregularly irregular  Respiratory: Clear to auscultation bilaterally, no wheezing  Abdomen: Soft, nontender, nondistended, + bowel sounds  Extremities: warm dry without cyanosis clubbing or edema  Neuro: AAOx3, right-sided weakness, otherwise nonfocal  Skin: Without rashes exudates or nodules  Psych: Normal affect and demeanor with intact judgement and insight   Data Reviewed: I have personally reviewed following labs and imaging studies  CBC: Recent Labs  Lab 02/15/21 0833 02/15/21 0912 02/16/21 0445 02/17/21 0530 02/18/21 0139 02/19/21 0348  WBC 7.3  --  6.2 5.8 6.8 6.7  NEUTROABS 5.8  --   --   --   --   --   HGB 15.1 15.6 14.9 15.2 16.1 16.5  HCT 45.0 46.0 43.9 45.1 46.6 47.9  MCV 101.1*  --  99.5 98.5 97.1 96.6  PLT 134*  --  123* 125* 128* 696*   Basic Metabolic Panel: Recent Labs  Lab 02/15/21 0833 02/15/21 0912 02/16/21 0445 02/17/21 0530 02/18/21 0139 02/19/21 0348  NA 139 142 137 136 136 137  K 3.9 4.0 4.0 4.2 4.1 4.4  CL 106 105 106 104 102 103  CO2 27  --  26 26 24 24   GLUCOSE 117* 107* 88 94 101* 100*  BUN 11 9 7 9 7 10   CREATININE 1.21 1.20 1.12 1.18 1.08 1.20  CALCIUM 9.1  --  8.6* 8.7* 9.0 9.2  MG  --   --  1.8  --   --   --    GFR: Estimated Creatinine Clearance: 65.5 mL/min (by C-G formula based on SCr of 1.2 mg/dL). Liver Function Tests: Recent Labs   Lab 02/15/21 0833  AST 28  ALT 25  ALKPHOS 118  BILITOT 0.9  PROT 7.1  ALBUMIN 3.1*   No results for input(s): LIPASE, AMYLASE in the last 168 hours. Recent Labs  Lab 02/15/21 1023  AMMONIA 12   Coagulation Profile: Recent Labs  Lab 02/15/21 0833  INR 1.3*   Cardiac Enzymes: No results for input(s): CKTOTAL, CKMB, CKMBINDEX, TROPONINI in the last 168 hours. BNP (last 3 results) No results for input(s): PROBNP in the last 8760 hours. HbA1C: No results for input(s): HGBA1C in the last 72 hours. CBG: No results for input(s): GLUCAP in the last 168 hours. Lipid Profile: No results for input(s): CHOL, HDL, LDLCALC, TRIG, CHOLHDL, LDLDIRECT in the last 72 hours. Thyroid Function Tests: No results for input(s): TSH,  T4TOTAL, FREET4, T3FREE, THYROIDAB in the last 72 hours. Anemia Panel: No results for input(s): VITAMINB12, FOLATE, FERRITIN, TIBC, IRON, RETICCTPCT in the last 72 hours. Urine analysis:    Component Value Date/Time   COLORURINE YELLOW 02/15/2021 [place] 02/15/2021 0955   LABSPEC 1.016 02/15/2021 0955   PHURINE 5.0 02/15/2021 0955   GLUCOSEU NEGATIVE 02/15/2021 0955   HGBUR NEGATIVE 02/15/2021 [place] 02/15/2021 [place] 02/15/2021 0955   PROTEINUR NEGATIVE 02/15/2021 0955   UROBILINOGEN 1.0 06/24/2015 0837   NITRITE NEGATIVE 02/15/2021 0955   LEUKOCYTESUR NEGATIVE 02/15/2021 0955   Sepsis Labs: @LABRCNTIP (procalcitonin:4,lacticidven:4)  ) Recent Results (from the past 240 hour(s))  Resp Panel by RT-PCR (Flu A&B, Covid) Nasopharyngeal Swab     Status: None   Collection Time: 02/15/21  8:41 AM   Specimen: Nasopharyngeal Swab; Nasopharyngeal(NP) swabs in vial transport medium  Result Value Ref Range Status   SARS Coronavirus 2 by RT PCR NEGATIVE NEGATIVE Final    Comment: (NOTE) SARS-CoV-2 target nucleic acids are NOT DETECTED.  The SARS-CoV-2 RNA is generally detectable in upper  respiratory specimens during the acute phase of infection. The lowest concentration of SARS-CoV-2 viral copies this assay can detect is 138 copies/mL. A negative result does not preclude SARS-Cov-2 infection and should not be used as the sole basis for treatment or other patient management decisions. A negative result may occur with  improper specimen collection/handling, submission of specimen other than nasopharyngeal swab, presence of viral mutation(s) within the areas targeted by this assay, and inadequate number of viral copies(<138 copies/mL). A negative result must be combined with clinical observations, patient history, and epidemiological information. The expected result is Negative.  Fact Sheet for Patients:  EntrepreneurPulse.com.au  Fact Sheet for Healthcare Providers:  IncredibleEmployment.be  This test is no t yet approved or cleared by the [place] FDA and  has been authorized for detection and/or diagnosis of SARS-CoV-2 by FDA under an Emergency Use Authorization (EUA). This EUA will remain  in effect (meaning this test can be used) for the duration of the COVID-19 declaration under Section 564(b)(1) of the Act, 21 U.S.C.section 360bbb-3(b)(1), unless the authorization is terminated  or revoked sooner.       Influenza A by PCR NEGATIVE NEGATIVE Final   Influenza B by PCR NEGATIVE NEGATIVE Final    Comment: (NOTE) The Xpert Xpress SARS-CoV-2/FLU/RSV plus assay is intended as an aid in the diagnosis of influenza from Nasopharyngeal swab specimens and should not be used as a sole basis for treatment. Nasal washings and aspirates are unacceptable for Xpert Xpress SARS-CoV-2/FLU/RSV testing.  Fact Sheet for Patients: EntrepreneurPulse.com.au  Fact Sheet for Healthcare Providers: IncredibleEmployment.be  This test is not yet approved or cleared by the [place] FDA and has been  authorized for detection and/or diagnosis of SARS-CoV-2 by FDA under an Emergency Use Authorization (EUA). This EUA will remain in effect (meaning this test can be used) for the duration of the COVID-19 declaration under Section 564(b)(1) of the Act, 21 U.S.C. section 360bbb-3(b)(1), unless the authorization is terminated or revoked.  Performed at [facility], [address], [place], [place] [address]   MRSA PCR Screening     Status: None   Collection Time: 02/18/21 12:30 PM   Specimen: Nasal Mucosa; Nasopharyngeal  Result Value Ref Range Status   MRSA by PCR NEGATIVE NEGATIVE Final    Comment:        The GeneXpert MRSA Assay (FDA approved for  NASAL specimens only), is one component of a comprehensive MRSA colonization surveillance program. It is not intended to diagnose MRSA infection nor to guide or monitor treatment for MRSA infections. Performed at [facility], [place] [address], [place], [place] [address]       Radiology Studies: CT HEAD WO CONTRAST  Result Date: 02/17/2021 CLINICAL DATA:  Stroke, worsening right-sided deficits EXAM: CT HEAD WITHOUT CONTRAST TECHNIQUE: Contiguous axial images were obtained from the base of the skull through the vertex without intravenous contrast. COMPARISON:  Correlation made with recent MRI and CT. FINDINGS: Brain: Interval development of areas of hypoattenuation, for example along the posterior left insula corresponding to acute infarcts on MRI. There is no evidence of hemorrhagic transformation. Stable findings of chronic microvascular ischemic changes. Ventricles are stable in size. Vascular: No new findings. Skull: Calvarium is unremarkable. Sinuses/Orbits: No acute finding. Other: None. IMPRESSION: Areas of acute infarction on recent MRI are now partially visible on CT. No evidence of hemorrhagic transformation. Electronically Signed   By: Macy Mis M.D.   On: 02/17/2021 17:35     Scheduled Meds: . aspirin EC  81 mg Oral  Daily  . Chlorhexidine Gluconate Cloth  6 each Topical Daily  . dabigatran  150 mg Oral Q12H  . metoprolol succinate  50 mg Oral Daily  . pantoprazole  40 mg Oral Daily  . simvastatin  40 mg Oral q1800   Continuous Infusions:   LOS: 4 days   Time Spent in minutes   45 minutes  Heywood Tokunaga D.O. on 02/19/2021 at 9:02 AM  Between 7am to 7pm - Please see pager noted on [email or web address]  After 7pm go to [email or web address]  And look for the night coverage person covering for me after hours  [facility] Office  [phone]

## 2021-02-19 NOTE — Consult Note (Signed)
Physical Medicine and Rehabilitation Consult Reason for Consult: Dysarthria with left gaze preference Referring Physician: Dr. Avon Gully   HPI: Juan Wheeler is a 60 y.o. right-handed male with history of squamous cell carcinoma of the lung, rectal adenocarcinoma, neuropathy, cardiomyopathy with ejection fraction of 30-35%, bilateral pulmonary emboli on Eliquis, atrial fibrillation, CAD/PAD, hypertension, tobacco abuse, chronic back pain followed at [facility].  History taken from chart review due to cognition and HOH.  Patient lives with spouse independent prior to admission.  1 level home with 3-4 steps to entry.  He presented on 02/23/2021 with weakness, dysarthria, and left gaze preference. CT/MRI as well as MRA showed multifocal acute ischemia within bilateral, left greater than right multiple vascular territories.  The largest area of abnormality at the posterior left insula.  The pattern most suggestive of embolic process.  Occlusion of the left vertebral artery with distal reconstitution due to flow across the vertebrobasilar confluence.  Normal intracranial MRA MRI cervical spine showed mild spinal canal and bilateral neuroforaminal stenosis at C5-6.  Mild left C2-3, left C3-4 and left C6-7 neural foraminal stenosis.  Admission chemistries unremarkable aside from glucose of 117, alcohol negative, urine drug screen positive marijuana.  Recent echocardiogram with ejection fraction of 30-35%, global hypokinesis.  Neurology follow-up currently maintained on Wheeler-dose aspirin as well as Pradaxa for CVA prophylaxis.  Tolerating a regular consistency diet.  CIR was consulted due to right hemiparesis, dysarthria, cognitive dysfunction.  Review of Systems  Constitutional: Positive for malaise/fatigue. Negative for chills and fever.  HENT: Negative for hearing loss.   Eyes: Negative for blurred vision and double vision.  Respiratory: Negative for cough.        Occasional shortness of breath  with heavy exertion  Cardiovascular: Positive for palpitations. Negative for chest pain and leg swelling.  Gastrointestinal: Positive for constipation. Negative for heartburn and nausea.       GERD  Genitourinary: Positive for urgency. Negative for dysuria, flank pain and hematuria.  Musculoskeletal: Positive for joint pain and myalgias.  Skin: Negative for rash.  Neurological: Positive for speech change, focal weakness and weakness.  All other systems reviewed and are negative.  Past Medical History:  Diagnosis Date  . Chronic lower back pain    a. Followed by pain management at [facility].  . Colon cancer [facility])    rectal cancer  . Coronary artery disease    a. 03/2013: abnl nuc -> LHC s/p DES to LCx, residual moderate disease in LAD (med rx unless refractory angina). b. Not on BB due to bradycardia.  Marland Kitchen DVT (deep venous thrombosis) ([place]) ~ 2013  . Dysrhythmia    AFib  . GERD (gastroesophageal reflux disease)   . H/O necrotising fasciitis   . History of blood transfusion    "once; after throwing up alot of blood" (04/17/2013)  . Hypertension   . LV dysfunction    a. EF 45% in 03/2013.  Marland Kitchen PAD (peripheral artery disease) ([place])    a. Occlusion of the right internal iliac artery, with significant atherosclerosis in the left internal iliac which was not amenable to reconstruction per notes from [place] in place 05/30/2020  . Pulmonary nodules 09/30/2014  . Rectal cancer ([place])   . Tobacco abuse    Past Surgical History:  Procedure Laterality Date  . ABDOMINAL SURGERY  1990's   'for stomach ulcers" (04/17/2013)  . COLECTOMY  2012   "for rectal cancer" (04/17/2013)  . COLONOSCOPY  2013  Dr. Cheryll Cockayne: colorectal anastomosis with ulcer and inflammation, benign biopsy  . COLONOSCOPY WITH PROPOFOL N/A 09/07/2016   Procedure: COLONOSCOPY WITH PROPOFOL;  Surgeon: Daneil Dolin, MD;  [facility]: [facility];  Service: Endoscopy;  Laterality: N/A;  10:00  am Colonoscopy via rectum and ostomy  . COLOSTOMY TAKEDOWN  2013  . CORONARY ANGIOPLASTY WITH STENT PLACEMENT  04/17/2013   "?1" (04/17/2013)  . FEMORAL-POPLITEAL BYPASS GRAFT Left 07/02/2015   Procedure: BYPASS GRAFT LEFT COMMON FEMORAL ARTERY TO LEFT ABOVE KNEE POPLITEAL ARTERY - USING LEFT GREATER SAPPHENOUS VEIN;  Surgeon: Elam Dutch, MD;  Location: [place];  Service: Vascular;  Laterality: Left;  . INCISION AND DRAINAGE ABSCESS Left 06/05/2016   Procedure: INCISION AND DRAINAGE ABSCESS;  Surgeon: Aviva Signs, MD;  Location: [facility] ORS;  Service: General;  Laterality: Left;  . INCISION AND DRAINAGE PERIRECTAL ABSCESS Left 06/07/2016   Procedure: IRRIGATION AND DEBRIDEMENT LEFT BUTTOCK ABSCESS;  Surgeon: Greer Pickerel, MD;  Location: [place];  Service: General;  Laterality: Left;  . INGUINAL HERNIA REPAIR Bilateral 1990's  . LAPAROSCOPIC PARTIAL COLECTOMY N/A 06/11/2016   Procedure: LAPAROSCOPIC  OPEN COLOSTOMY;  Surgeon: [name], MD;  Location: [place];  Service: General;  Laterality: N/A;  . LEFT HEART CATHETERIZATION WITH CORONARY ANGIOGRAM N/A 04/17/2013   Procedure: LEFT HEART CATHETERIZATION WITH CORONARY ANGIOGRAM;  Surgeon: Peter M Martinique, MD;  Location: [facility] CATH LAB;  Service: Cardiovascular;  Laterality: N/A;  . PERCUTANEOUS STENT INTERVENTION  04/17/2013   Procedure: PERCUTANEOUS STENT INTERVENTION;  Surgeon: Peter M Martinique, MD;  Location: [facility] CATH LAB;  Service: Cardiovascular;;  . PERIPHERAL VASCULAR CATHETERIZATION N/A 06/14/2015   Procedure: Abdominal Aortogram;  Surgeon: Elam Dutch, MD;  Location: [place] CV LAB;  Service: Cardiovascular;  Laterality: N/A;  . PERIPHERAL VASCULAR CATHETERIZATION Bilateral 06/14/2015   Procedure: Lower Extremity Angiography;  Surgeon: Elam Dutch, MD;  Location: [place] CV LAB;  Service: Cardiovascular;  Laterality: Bilateral;  . PORTACATH PLACEMENT Left 06/05/2020   Procedure: INSERTION PORT-A-CATH;  Surgeon: Aviva Signs, MD;   Location: [facility];  Service: General;  Laterality: Left;  Marland Kitchen VEIN HARVEST Left 07/02/2015   Procedure: VEIN HARVEST - LEFT GREATER SAPPHENOUS VEIN;  Surgeon: Elam Dutch, MD;  Location: [facility] OR;  Service: Vascular;  Laterality: Left;   Family History  Problem Relation Age of Onset  . Cancer Mother   . Hypertension Mother   . Bleeding Disorder Brother    Social History:  reports that he has been smoking cigarettes. He started smoking about 46 years ago. He has a 10.00 pack-year smoking history. He has never used smokeless tobacco. He reports current alcohol use of about 3.0 standard drinks of alcohol per week. He reports current drug use. Drug: Marijuana. Allergies:  Allergies  Allergen Reactions  . Tramadol Other (See Comments)    Felt sluggish and ineffective   . Darvocet [Propoxyphene N-Acetaminophen] Palpitations   Medications Prior to Admission  Medication Sig Dispense Refill  . apixaban (ELIQUIS) 5 MG TABS tablet Take 1 tablet (5 mg total) by mouth 2 (two) times daily. 60 tablet 1  . aspirin EC 81 MG tablet Take 81 mg by mouth daily.    [name] Oris IV Inject into the vein every 14 (fourteen) days.    Marland Kitchen HYDROcodone-acetaminophen (NORCO) 10-325 MG tablet Take 1 tablet by mouth every 4 (four) hours as needed. (Patient taking differently: Take 1 tablet by mouth every 4 (four) hours as needed for severe pain.) 180 tablet  0  . isosorbide mononitrate (IMDUR) 30 MG 24 hr tablet TAKE 1/2 TABLET BY MOUTH ONCE DAILY. (Patient taking differently: Take 15 mg by mouth daily.) 15 tablet 3  . losartan (COZAAR) 25 MG tablet Take 0.5 tablets (12.5 mg total) by mouth daily. 30 tablet 1  . metoprolol succinate (TOPROL-XL) 25 MG 24 hr tablet Take 1 tablet (25 mg total) by mouth daily. 30 tablet 1  . NITROSTAT 0.4 MG SL tablet PLACE 1 TABLET UNDER THE TONGUE AS NEEDED FOR CHEST PAIN UP TO 3 DOSES (Patient taking differently: Place 0.4 mg under the tongue every 5 (five) minutes as needed for chest pain.)  25 tablet 3  . omeprazole (PRILOSEC) 40 MG capsule Take 40 mg by mouth daily.    Marland Kitchen oxyCODONE-acetaminophen (PERCOCET) 10-325 MG tablet Take 1 tablet by mouth every 6 (six) hours as needed for pain. 60 tablet 0    Home: Home Living Family/patient expects to be discharged to:: Private residence Living Arrangements: Spouse/significant other Available Help at Discharge: Family,Available PRN/intermittently Type of Home: Mobile home Home Access: Stairs to enter [employer] of Steps: 3-4 Entrance Stairs-Rails: Right,Left Home Layout: One level Bathroom Shower/Tub: Engineer, petroleum: Standard Bathroom Accessibility: Yes Home Equipment: Cane - single point,Hand held shower head  Lives With: Spouse  Functional History: Prior Function Level of Independence: Independent Comments: I with ADLs/IADLs without AD; drives; shared responsibility with wife for IADLs Functional Status:  Mobility: Bed Mobility Overal bed mobility: Needs Assistance Bed Mobility: Supine to Sit Supine to sit: Mod assist,HOB elevated Sit to supine: Max assist General bed mobility comments: mod assist for progression of RLE to EOB, sequential scooting to EOB. Bed exit towards pt's L, verbal cuing for sequencing task and safe placement of RUE. Transfers Overall transfer level: Needs assistance Equipment used: 2 person hand held assist Transfers: Sit to/from Stand Sit to Stand: +2 physical assistance,Mod assist General transfer comment: Mod +2 for initial power up, rise, steadying, RLE close guard with blacking during dynamic activity. Ambulation/Gait Ambulation/Gait assistance: +2 physical assistance,Max assist Gait Distance (Feet): 2 Feet Assistive device: 2 person hand held assist Gait Pattern/deviations: Step-to pattern,Decreased weight shift to right,Decreased stance time - right,Trunk flexed General Gait Details: Pt able to take 2 steps towards [facility] with max +2 assist for weight  shifting L/R, RLE blocking during stance phase, steadying.    ADL: ADL Overall ADL's : Needs assistance/impaired Grooming: Maximal assistance Grooming Details (indicate cue type and reason): sitting at EOB with (A) for static balance Upper Body Bathing: Maximal assistance Toilet Transfer: +2 for physical assistance,Moderate assistance Toilet Transfer Details (indicate cue type and reason): simulated EOB static standing General ADL Comments: Pt motivated to participate.  Cognition: Cognition Overall Cognitive Status: Impaired/Different from baseline Arousal/Alertness: Awake/alert Orientation Level: Oriented X4 Attention: Focused,Sustained Focused Attention: Impaired Focused Attention Impairment: Verbal complex,Functional complex Sustained Attention: Impaired Sustained Attention Impairment: Verbal complex,Functional complex Memory: Impaired Memory Impairment: Decreased recall of new information,Decreased short term memory Awareness: Impaired Problem Solving: Impaired Problem Solving Impairment: Verbal complex,Functional complex Executive Function: Organizing,Self Monitoring Organizing: Impaired Self Monitoring: Impaired Safety/Judgment: Impaired Cognition Arousal/Alertness: Awake/alert Behavior During Therapy: WFL for tasks assessed/performed Overall Cognitive Status: Impaired/Different from baseline Area of Impairment: Attention Current Attention Level: Sustained General Comments: Pt answering questions appropriately, requiring increased processing time to do so. Intermittent slurred speech and difficulty with word finding  Blood pressure (!) 117/93, pulse 66, temperature 97.7 F (36.5 C), temperature source Oral, resp. rate 18, height 5\' 9"  (1.753 m), weight  71.2 kg, SpO2 93 %. Physical Exam Constitutional:      General: He is not in acute distress.    Appearance: He is normal weight. He is not ill-appearing.  HENT:     Head: Normocephalic and atraumatic.     Right  Ear: External ear normal.     Left Ear: External ear normal.     Nose: Nose normal.  Eyes:     General:        Right eye: No discharge.        Left eye: No discharge.     Extraocular Movements: Extraocular movements intact.  Cardiovascular:     Comments: Irregularly irregular Pulmonary:     Effort: Pulmonary effort is normal. No respiratory distress.     Breath sounds: No stridor.  Abdominal:     General: Abdomen is flat. Bowel sounds are normal. There is no distension.  Musculoskeletal:     Cervical back: Normal range of motion and neck supple.     Comments: No edema or tenderness in extremities  Skin:    General: Skin is warm and dry.  Neurological:     Mental Status: He is alert.     Comments: NAD Dysarthria HOH Motor: LUE/LE: 5/5 proximal distal RUE/RLE: 0/5 proximal distal   Psychiatric:        Speech: Speech is delayed and slurred.        Behavior: Behavior is slowed. Behavior is not agitated.     Results for orders placed or performed during the hospital encounter of 02/15/21 (from the past 24 hour(s))  MRSA PCR Screening     Status: None   Collection Time: 02/18/21 12:30 PM   Specimen: Nasal Mucosa; Nasopharyngeal  Result Value Ref Range   MRSA by PCR NEGATIVE NEGATIVE  CBC     Status: Abnormal   Collection Time: 02/19/21  3:48 AM  Result Value Ref Range   WBC 6.7 4.0 - 10.5 K/uL   RBC 4.96 4.22 - 5.81 MIL/uL   Hemoglobin 16.5 13.0 - 17.0 g/dL   HCT 47.9 39.0 - 52.0 %   MCV 96.6 80.0 - 100.0 fL   MCH 33.3 26.0 - 34.0 pg   MCHC 34.4 30.0 - 36.0 g/dL   RDW 15.4 11.5 - 15.5 %   Platelets 110 (L) 150 - 400 K/uL   nRBC 0.0 0.0 - 0.2 %  Basic metabolic panel     Status: Abnormal   Collection Time: 02/19/21  3:48 AM  Result Value Ref Range   Sodium 137 135 - 145 mmol/L   Potassium 4.4 3.5 - 5.1 mmol/L   Chloride 103 98 - 111 mmol/L   CO2 24 22 - 32 mmol/L   Glucose, Bld 100 (H) 70 - 99 mg/dL   BUN 10 6 - 20 mg/dL   Creatinine, Ser 1.20 0.61 - 1.24 mg/dL    Calcium 9.2 8.9 - 10.3 mg/dL   GFR, Estimated >60 >60 mL/min   Anion gap 10 5 - 15   CT HEAD WO CONTRAST  Result Date: 02/17/2021 CLINICAL DATA:  Stroke, worsening right-sided deficits EXAM: CT HEAD WITHOUT CONTRAST TECHNIQUE: Contiguous axial images were obtained from the base of the skull through the vertex without intravenous contrast. COMPARISON:  Correlation made with recent MRI and CT. FINDINGS: Brain: Interval development of areas of hypoattenuation, for example along the posterior left insula corresponding to acute infarcts on MRI. There is no evidence of hemorrhagic transformation. Stable findings of chronic microvascular ischemic changes. Ventricles are  stable in size. Vascular: No new findings. Skull: Calvarium is unremarkable. Sinuses/Orbits: No acute finding. Other: None. IMPRESSION: Areas of acute infarction on recent MRI are now partially visible on CT. No evidence of hemorrhagic transformation. Electronically Signed   By: Macy Mis M.D.   On: 02/17/2021 17:35   Assessment/Plan: Diagnosis: Multifocal acute ischemia within bilateral, left greater than right multiple vascular territories due to left vertebral artery occlusion Stroke: Continue secondary stroke prophylaxis and Risk Factor Modification listed below:   Antiplatelet therapy:   Blood Pressure Management:  Continue current medication with prn's with permisive HTN per primary team Statin Agent:   Tobacco abuse:   Right sided hemiparesis: fit for orthosis to prevent contractures (resting hand splint for day, wrist cock up splint at night, PRAFO, etc) PT/OT for mobility, ADL training  Motor recovery: Fluoxetine Labs independently reviewed.  Records reviewed and summated above.  1. Does the need for close, 24 hr/day medical supervision in concert with the patient's rehab needs make it unreasonable for this patient to be served in a less intensive setting? Yes 2. Co-Morbidities requiring supervision/potential  complications: squamous cell carcinoma of the lung, rectal adenocarcinoma, neuropathy, cardiomyopathy with ejection fraction of 30-35% (Monitor in accordance with increased physical activity and avoid UE resistance excercises), bilateral pulmonary emboli (now on Pradaxa), atrial fibrillation, CAD/PAD, HTN (monitor and provide prns in accordance with increased physical exertion and pain), tobacco abuse (counsel), chronic back pain (Biofeedback training with therapies to help reduce reliance on opiate pain medications, monitor pain control during therapies, and sedation at rest and titrate to maximum efficacy to ensure participation and gains in therapies), marijuana abuse (counsel and appropriate) 3. Due to bladder management, bowel management, safety, skin/wound care, disease management, medication administration, pain management and patient education, does the patient require 24 hr/day rehab nursing? Yes 4. Does the patient require coordinated care of a physician, rehab nurse, therapy disciplines of PT/OT/SLP to address physical and functional deficits in the context of the above medical diagnosis(es)? Yes Addressing deficits in the following areas: balance, endurance, locomotion, strength, transferring, bathing, dressing, toileting, cognition, speech and psychosocial support 5. Can the patient actively participate in an intensive therapy program of at least 3 hrs of therapy per day at least 5 days per week? Yes 6. The potential for patient to make measurable gains while on inpatient rehab is excellent 7. Anticipated functional outcomes upon discharge from inpatient rehab are min assist and mod assist  with PT, min assist and mod assist with OT, supervision and min assist with SLP. 8. Estimated rehab length of stay to reach the above functional goals is: 20-24 days 9. Anticipated discharge destination: TBD 10. Overall Rehab/Functional Prognosis: good  RECOMMENDATIONS: This patient's condition is  appropriate for continued rehabilitative care in the following setting: CIR if adequate caregiver support available upon discharge. Patient has agreed to participate in recommended program. Potentially Note that insurance prior authorization may be required for reimbursement for recommended care.  Comment: Rehab Admissions Coordinator to follow up.  I have personally performed a face to face diagnostic evaluation, including, but not limited to relevant history and physical exam findings, of this patient and developed relevant assessment and plan.  Additionally, I have reviewed and concur with the physician assistant's documentation above.   Delice Lesch, MD, ABPMR Lavon Paganini Angiulli, [name] 02/19/2021

## 2021-02-20 ENCOUNTER — Encounter (HOSPITAL_COMMUNITY): Payer: Self-pay | Admitting: Hematology

## 2021-02-20 DIAGNOSIS — I482 Chronic atrial fibrillation, unspecified: Secondary | ICD-10-CM

## 2021-02-20 DIAGNOSIS — I749 Embolism and thrombosis of unspecified artery: Secondary | ICD-10-CM | POA: Diagnosis not present

## 2021-02-20 DIAGNOSIS — F1210 Cannabis abuse, uncomplicated: Secondary | ICD-10-CM | POA: Diagnosis not present

## 2021-02-20 DIAGNOSIS — I5042 Chronic combined systolic (congestive) and diastolic (congestive) heart failure: Secondary | ICD-10-CM | POA: Diagnosis not present

## 2021-02-20 LAB — BASIC METABOLIC PANEL
Anion gap: 11 (ref 5–15)
BUN: 12 mg/dL (ref 6–20)
CO2: 24 mmol/L (ref 22–32)
Calcium: 9.5 mg/dL (ref 8.9–10.3)
Chloride: 101 mmol/L (ref 98–111)
Creatinine, Ser: 1.4 mg/dL — ABNORMAL HIGH (ref 0.61–1.24)
GFR, Estimated: 58 mL/min — ABNORMAL LOW (ref >60)
Glucose, Bld: 110 mg/dL — ABNORMAL HIGH (ref 70–99)
Potassium: 4.2 mmol/L (ref 3.5–5.1)
Sodium: 136 mmol/L (ref 135–145)

## 2021-02-20 LAB — CBC
HCT: 50.4 % (ref 39.0–52.0)
Hemoglobin: 17.1 g/dL — ABNORMAL HIGH (ref 13.0–17.0)
MCH: 33.1 pg (ref 26.0–34.0)
MCHC: 33.9 g/dL (ref 30.0–36.0)
MCV: 97.7 fL (ref 80.0–100.0)
Platelets: 123 10*3/uL — ABNORMAL LOW (ref 150–400)
RBC: 5.16 MIL/uL (ref 4.22–5.81)
RDW: 15.4 % (ref 11.5–15.5)
WBC: 7.2 10*3/uL (ref 4.0–10.5)
nRBC: 0 % (ref 0.0–0.2)

## 2021-02-20 MED ORDER — HEPARIN SOD (PORK) LOCK FLUSH 100 UNIT/ML IV SOLN
500.0000 [IU] | INTRAVENOUS | Status: AC | PRN
Start: 2021-02-20 — End: 2021-02-20
  Administered 2021-02-20: 500 [IU]
  Filled 2021-02-20: qty 5

## 2021-02-20 NOTE — Progress Notes (Signed)
Physical Therapy Treatment Patient Details Name: Juan Wheeler MRN: [id number] DOB: November 24, 1960 Today's Date: 02/20/2021    History of Present Illness 60 y.o. male presenting to ED on 5/21 with acute onset of diffuse weakness. MRI (+) multifocal acute ischemia within both hemispheres (L >R) with largest area at posterior L insula suggestive of embolic process in setting of A-fib. Patient also with occlusion of L vertebral artery. CT 5/23Areas of acute infarction on recent MRI are now partially visible on  CT.PMHx significant for lung cancer, rectal cancer, neuropathy, cardiomyopathy, A-fib and CAD/PAD.    PT Comments    Pt motivated to participate in mobility, PT and OT seeing pt together to progress functional mobility. Pt with improving activity tolerance, performing repeated sit<>stands and standing tasks x3 minutes with UE support. PT focused on midline positioning with tactile facilitation, preventing R knee recurvatum or hyperflexion, and controlled descent during stand>sit. + hamstring contraction appreciated today, but unable to produce on command. Pt continues to be a great CIR candidate, will continue to follow.     Follow Up Recommendations  Supervision for mobility/OOB;CIR     Equipment Recommendations  Rolling walker with 5" wheels    Recommendations for Other Services       Precautions / Restrictions Precautions Precautions: Fall Precaution Comments: Monitor HR Juan Wheeler BP - WFL and stable today Restrictions Weight Bearing Restrictions: No    Mobility  Bed Mobility Overal bed mobility: Needs Assistance Bed Mobility: Supine to Sit     Supine to sit: Min assist     General bed mobility comments: min assist for completion of trunk elevation, RLE management. Exited bed towards R, PT assisting pt in weight shifting L/R for sequential scooting to EOB.    Transfers Overall transfer level: Needs assistance Equipment used: 2 person hand held assist Transfers: Sit  to/from Stand Sit to Stand: Mod assist;+2 physical assistance         General transfer comment: mod +2 for power up, rise, steadying, RLE blocking to prevent severe buckling. Max tactile and verbal cuing to shift weight to L side, pt with preference for R lateral leaning. sit<>stand x3 from EOB and recliner x2. Overall standing tolerance x3 mins  Ambulation/Gait                 Stairs             Wheelchair Mobility    Modified Rankin (Stroke Patients Only) Modified Rankin (Stroke Patients Only) Pre-Morbid Rankin Score: No symptoms Modified Rankin: Moderately severe disability     Balance Overall balance assessment: Needs assistance Sitting-balance support: Single extremity supported;Feet supported Sitting balance-Leahy Scale: Fair Sitting balance - Comments: seated at EOB   Standing balance support: Single extremity supported;During functional activity Standing balance-Leahy Scale: Poor Standing balance comment: mod-max +2                            Cognition Arousal/Alertness: Awake/alert Behavior During Therapy: WFL for tasks assessed/performed Overall Cognitive Status: Impaired/Different from baseline Area of Impairment: Attention;Following commands;Problem solving                   Current Attention Level: Sustained   Following Commands: Follows one step commands consistently     Problem Solving: Slow processing;Requires verbal cues;Difficulty sequencing General Comments: Cues for sequencing of washing face and attending to task. Cues for problem solving weight shifting      Exercises      General  Comments General comments (skin integrity, edema, etc.): HR 90-105 bpm during session, BP WFL pre- and mid-mobility      Pertinent Vitals/Pain Pain Assessment: Faces Faces Pain Scale: Hurts a little bit Pain Location: R hand Pain Descriptors / Indicators: Discomfort;Grimacing Pain Intervention(s): Limited activity within  patient's tolerance;Monitored during session;Repositioned    Home Living                      Prior Function            PT Goals (current goals can now be found in the care plan section) Acute Rehab PT Goals Patient Stated Goal: get better PT Goal Formulation: With patient/family Time For Goal Achievement: 03/01/21 Potential to Achieve Goals: Good Progress towards PT goals: Progressing toward goals    Frequency    Min 4X/week      PT Plan Current plan remains appropriate    Co-evaluation PT/OT/SLP Co-Evaluation/Treatment: Yes Reason for Co-Treatment: For patient/therapist safety;To address functional/ADL transfers PT goals addressed during session: Mobility/safety with mobility;Balance;Strengthening/ROM OT goals addressed during session: ADL's and self-care      AM-PAC PT "6 Clicks" Mobility   Outcome Measure  Help needed turning from your back to your side while in a flat bed without using bedrails?: A Lot Help needed moving from lying on your back to sitting on the side of a flat bed without using bedrails?: A Lot Help needed moving to and from a bed to a chair (including a wheelchair)?: A Lot Help needed standing up from a chair using your arms (e.g., wheelchair or bedside chair)?: A Lot Help needed to walk in hospital room?: A Lot Help needed climbing 3-5 steps with a railing? : Total 6 Click Score: 11    End of Session Equipment Utilized During Treatment: Gait belt Activity Tolerance: Patient tolerated treatment well Patient left: with call bell/phone within reach;in chair;with chair alarm set Nurse Communication: Mobility status;Other (comment) (needs new wound bandage on buttocks, +2 squat pivot towards R back to bed in ~1 hour - conveyed to NT) PT Visit Diagnosis: Other abnormalities of gait and mobility (R26.89);Muscle weakness (generalized) (M62.81);Difficulty in walking, not elsewhere classified (R26.2)     Time: 5366-4403 PT Time Calculation  (min) (ACUTE ONLY): 30 min  Charges:  $Neuromuscular Re-education: 8-22 mins                    Juan Wheeler, PT DPT Acute Rehabilitation Services Pager [phone]  Office [phone]   Juan Wheeler 02/20/2021, 3:44 PM

## 2021-02-20 NOTE — Progress Notes (Signed)
Occupational Therapy Treatment Patient Details Name: Juan Wheeler MRN: [id number] DOB: 10-Nov-1960 Today's Date: 02/20/2021    History of present illness 60 y.o. male presenting to ED on 5/21 with acute onset of diffuse weakness. MRI (+) multifocal acute ischemia within both hemispheres (L >R) with largest area at posterior L insula suggestive of embolic process in setting of A-fib. Patient also with occlusion of L vertebral artery. CT 5/23Areas of acute infarction on recent MRI are now partially visible on  CT.PMHx significant for lung cancer, rectal cancer, neuropathy, cardiomyopathy, A-fib and CAD/PAD.   OT comments  Pt progressing towards established OT goals and presents with high motivation to participate in therapy despite fatigue. Pt performing stand pivot to recliner with Mod A +2; blocking of R knee. Pt performing grooming task at sink with Max A +2 for standing balance, blocking of R knee, and weight bearing through RUE. Cues for sequencing to wash face. Pt performing sit<>stand from recliner with Mod A +2 x3. Continue to highly recommend dc to CIR for intensive OT and will continue to follow acutely as admitted.    Follow Up Recommendations  CIR    Equipment Recommendations  3 in 1 bedside commode;Wheelchair (measurements OT);Wheelchair cushion (measurements OT)    Recommendations for Other Services Rehab consult    Precautions / Restrictions Precautions Precautions: Fall Precaution Comments: Monitor HR /watch BP       Mobility Bed Mobility Overal bed mobility: Needs Assistance Bed Mobility: Supine to Sit     Supine to sit: Min assist     General bed mobility comments: min assist for completion of trunk elevation, RLE management. Exited bed towards R. Faciltiating weight bearing through RUE to push into sitting    Transfers Overall transfer level: Needs assistance Equipment used: 2 person hand held assist Transfers: Sit to/from Stand Sit to Stand: Mod assist;+2  physical assistance         General transfer comment: mod +2 for power up, rise, steadying, RLE blocking to prevent severe buckling. Max tactile and verbal cuing to shift weight to L side, pt with preference for R lateral leaning. sit<>stand x3    Balance Overall balance assessment: Needs assistance Sitting-balance support: Single extremity supported;Feet supported Sitting balance-Leahy Scale: Fair Sitting balance - Comments: seated at EOB   Standing balance support: Single extremity supported;During functional activity Standing balance-Leahy Scale: Poor Standing balance comment: max +2 assist                           ADL either performed or assessed with clinical judgement   ADL Overall ADL's : Needs assistance/impaired     Grooming: Wash/dry face;Maximal assistance;Standing Grooming Details (indicate cue type and reason): Pt standing at sink with Max A +2 for support at R knee and trunk. Faciltiating weight bearing through RUE at elbow and wrist. Pt washing his face with cues for sequencing and faciltiating weight bearing, crossing midling, weight shifts, and visual tracking.                 Toilet Transfer: Moderate assistance;+2 for physical assistance;Stand-pivot Toilet Transfer Details (indicate cue type and reason): Simulated to recliner. Mod A +2 for pivot to recliner.         Functional mobility during ADLs: Min guard;Rolling walker General ADL Comments: Pt motivated to participate. presenting with decreased control of RUE, balance, cognition, and strength     Vision   Vision Assessment?: Vision impaired- to be further tested  in functional context   Perception     Praxis      Cognition Arousal/Alertness: Awake/alert Behavior During Therapy: WFL for tasks assessed/performed Overall Cognitive Status: Impaired/Different from baseline Area of Impairment: Attention;Following commands;Problem solving                   Current Attention  Level: Sustained   Following Commands: Follows one step commands consistently     Problem Solving: Slow processing;Requires verbal cues;Difficulty sequencing General Comments: Cues for sequencing of washing face and attending to task. Cues for problem solving weight shifting        Exercises     Shoulder Instructions       General Comments HR and BP stable. HR 90-105    Pertinent Vitals/ Pain       Pain Assessment: Faces Faces Pain Scale: No hurt Pain Location: Generalized with movement Pain Descriptors / Indicators: Aching;Sore Pain Intervention(s): Monitored during session;Limited activity within patient's tolerance;Repositioned  Home Living                                          Prior Functioning/Environment              Frequency  Min 3X/week        Progress Toward Goals  OT Goals(current goals can now be found in the care plan section)  Progress towards OT goals: Progressing toward goals  Acute Rehab OT Goals Patient Stated Goal: get better OT Goal Formulation: With patient Time For Goal Achievement: 03/04/21 Potential to Achieve Goals: Good ADL Goals Pt Will Perform Eating: with max assist;sitting Pt Will Perform Grooming: with max assist;sitting Pt Will Perform Upper Body Dressing: with max assist;sitting Pt Will Perform Lower Body Dressing: with max assist;sit to/from stand Pt Will Transfer to Toilet: with max assist;squat pivot transfer Pt Will Perform Toileting - Clothing Manipulation and hygiene: with modified independence;sit to/from stand Additional ADL Goal #1: pt will demonstrate R UE AROM at the scapula with Max cues  Plan Discharge plan remains appropriate    Co-evaluation    PT/OT/SLP Co-Evaluation/Treatment: Yes Reason for Co-Treatment: For patient/therapist safety;To address functional/ADL transfers   OT goals addressed during session: ADL's and self-care      AM-PAC OT "6 Clicks" Daily Activity      Outcome Measure   Help from another person eating meals?: A Lot Help from another person taking care of personal grooming?: A Lot Help from another person toileting, which includes using toliet, bedpan, or urinal?: A Lot Help from another person bathing (including washing, rinsing, drying)?: A Lot Help from another person to put on and taking off regular upper body clothing?: A Lot Help from another person to put on and taking off regular lower body clothing?: A Lot 6 Click Score: 12    End of Session Equipment Utilized During Treatment: Gait belt  OT Visit Diagnosis: Muscle weakness (generalized) (M62.81);Ataxia, unspecified (R27.0);Hemiplegia and hemiparesis Hemiplegia - Right/Left: Right Hemiplegia - dominant/non-dominant: Dominant Hemiplegia - caused by: Cerebral infarction   Activity Tolerance Patient tolerated treatment well   Patient Left with call bell/phone within reach;in chair;with chair alarm set   Nurse Communication Mobility status;Precautions        Time: 5621-3086 OT Time Calculation (min): 30 min  Charges: OT General Charges $OT Visit: 1 Visit OT Treatments $Self Care/Home Management : 8-22 mins  Kashae Carstens MSOT, OTR/L Acute Rehab  Pager: [phone] Office: [place] 02/20/2021, 2:08 PM

## 2021-02-20 NOTE — PMR Pre-admission (Addendum)
PMR Admission Coordinator Pre-Admission Assessment  Patient: Juan Wheeler is an 60 y.o., male MRN: [id number] DOB: Feb 26, 1961 Height: 5\' 9"  (175.3 cm) Weight: 71.2 kg              Insurance Information HMO:     PPO: yes     PCP:      IPA:      80/20:      OTHER:  PRIMARY: [employer]      Policy#: [id number]      Subscriber: pt CM Name: Dot      Phone#: [phone] ext [phone]     Fax#: [phone] Pre-Cert#: [id number] approved for 7 days f/u with Edwena Felty ext [phone] same fax      Employer:  Benefits:  Phone #: [phone]     Name: 5/26 Eff. Date: 11/26/2020     Deduct: none      Out of Pocket Max: $7550      Life Max: none  CIR: $450 per day days 1 until 4      SNF: no copay days 1 until 20; $188 co pay per day days 21 until 42; no copay days 42 until 100 Outpatient: $20 to $40 per visit     Co-Pay: visits per medical neccesity Home Health: 100%      Co-Pay: visits per medical neccesity DME: 80%     Co-Pay: 20% Providers: in network  SECONDARY: Medicaid of [place]   Policy#:   [id number] L:   Financial Counselor:       Phone#:   The "Data Collection Information Summary" for patients in Inpatient Rehabilitation Facilities with attached "Privacy Act [place] Records" was provided and verbally reviewed with: Patient and Family  Emergency Contact Information Contact Information     Name Relation Home Work Mobile   [place] Spouse [phone]      Current Medical History  Patient Admitting Diagnosis: CVA  History of Present Illness: 60 y.o. right-handed male with history of squamous cell carcinoma of the lung, rectal adenocarcinoma, neuropathy, cardiomyopathy with ejection fraction of 30-35%, bilateral pulmonary emboli on Eliquis, atrial fibrillation, CAD/PAD, hypertension, tobacco abuse, chronic back pain followed at [facility].   He presented on 02/23/2021 with weakness, dysarthria, and left gaze preference. CT/MRI as well as MRA  showed multifocal acute ischemia within bilateral, left greater than right multiple vascular territories.  The largest area of abnormality at the posterior left insula.  The pattern most suggestive of embolic process.  Occlusion of the left vertebral artery with distal reconstitution due to flow across the vertebrobasilar confluence.  Normal intracranial MRA MRI cervical spine showed mild spinal canal and bilateral neuro foraminal stenosis at C5-6.  Mild left C2-3, left C3-4 and left C6-7 neural foraminal stenosis.  Admission chemistries unremarkable aside from glucose of 117, alcohol negative, urine drug screen positive marijuana.  Recent echocardiogram with ejection fraction of 30-35%, global hypokinesis.  Neurology follow-up currently maintained on low-dose aspirin as well as Pradaxa for CVA prophylaxis.  Tolerating a regular consistency diet.   Complete NIHSS TOTAL: 3 Glasgow Coma Scale Score: 15  Past Medical History  Past Medical History:  Diagnosis Date   Chronic lower back pain    a. Followed by pain management at [facility].   Colon cancer [facility])    rectal cancer   Coronary artery disease    a. 03/2013: abnl nuc -> LHC s/p DES to LCx, residual moderate disease in LAD (med rx unless refractory angina). b. Not on BB due to  bradycardia.   DVT (deep venous thrombosis) ([place]) ~ 2013   Dysrhythmia    AFib   GERD (gastroesophageal reflux disease)    H/O necrotising fasciitis    History of blood transfusion    "once; after throwing up alot of blood" (04/17/2013)   Hypertension    LV dysfunction    a. EF 45% in 03/2013.   PAD (peripheral artery disease) ([place])    a. Occlusion of the right internal iliac artery, with significant atherosclerosis in the left internal iliac which was not amenable to reconstruction per notes from [place] in place 05/30/2020   Pulmonary nodules 09/30/2014   Rectal cancer ([place])    Tobacco abuse     Family History  family history includes Bleeding  Disorder in his brother; Cancer in his mother; Hypertension in his mother.  Prior Rehab/Hospitalizations:  Has the patient had prior rehab or hospitalizations prior to admission? Yes  Has the patient had major surgery during 100 days prior to admission? No  Current Medications   Current Facility-Administered Medications:    aspirin EC tablet 81 mg, 81 mg, Oral, Daily, Heath Lark D, DO, 81 mg at 02/21/21 0932   Chlorhexidine Gluconate Cloth 2 % PADS 6 each, 6 each, Topical, Daily, Heath Lark D, DO, 6 each at 02/21/21 6712   dabigatran (PRADAXA) capsule 150 mg, 150 mg, Oral, Q12H, Little Ishikawa, MD, 150 mg at 02/21/21 4580   HYDROcodone-acetaminophen (NORCO) 10-325 MG per tablet 1 tablet, 1 tablet, Oral, Q4H PRN, Manuella Ghazi, Pratik D, DO, 1 tablet at 02/20/21 9983   metoprolol succinate (TOPROL-XL) 24 hr tablet 50 mg, 50 mg, Oral, Daily, Leonie Man, Pramod S, MD, 50 mg at 02/21/21 0820   metoprolol tartrate (LOPRESSOR) injection 5 mg, 5 mg, Intravenous, Q6H PRN, Manuella Ghazi, Pratik D, DO, 5 mg at 02/19/21 0925   pantoprazole (PROTONIX) EC tablet 40 mg, 40 mg, Oral, Daily, Manuella Ghazi, Pratik D, DO, 40 mg at 02/21/21 3825   simvastatin (ZOCOR) tablet 40 mg, 40 mg, Oral, q1800, Manuella Ghazi, Pratik D, DO, 40 mg at 02/20/21 1900   sodium chloride flush (NS) 0.9 % injection 10-40 mL, 10-40 mL, Intracatheter, PRN, Little Ishikawa, MD, 10 mL at 02/20/21 1100  Patients Current Diet:  Diet Order             Diet Heart Room service appropriate? Yes; Fluid consistency: Thin  Diet effective now                   Precautions / Restrictions Precautions Precautions: Fall Precaution Comments: Monitor HR Liliane Shi BP - WFL and stable today Restrictions Weight Bearing Restrictions: No   Has the patient had 2 or more falls or a fall with injury in the past year?No  Prior Activity Level Community (5-7x/wk): independent, no AD, drives  Prior Functional Level Prior Function Level of Independence:  Independent Comments: I with ADLs/IADLs without AD; drives; shared responsibility with wife for IADLs  Self Care: Did the patient need help bathing, dressing, using the toilet or eating?  Independent  Indoor Mobility: Did the patient need assistance with walking from room to room (with or without device)? Independent  Stairs: Did the patient need assistance with internal or external stairs (with or without device)? Independent  Functional Cognition: Did the patient need help planning regular tasks such as shopping or remembering to take medications? Independent  Home Assistive Devices / Equipment Home Equipment: Cane - single point,Hand held shower head  Prior Device Use: Indicate devices/aids  used by the patient prior to current illness, exacerbation or injury? None of the above  Current Functional Level Cognition  Arousal/Alertness: Awake/alert Overall Cognitive Status: Impaired/Different from baseline Current Attention Level: Sustained Orientation Level: Oriented X4 Following Commands: Follows one step commands consistently General Comments: Cues for sequencing of washing face and attending to task. Cues for problem solving weight shifting Attention: Focused,Sustained Focused Attention: Impaired Focused Attention Impairment: Verbal complex,Functional complex Sustained Attention: Impaired Sustained Attention Impairment: Verbal complex,Functional complex Memory: Impaired Memory Impairment: Decreased recall of new information,Decreased short term memory Awareness: Impaired Problem Solving: Impaired Problem Solving Impairment: Verbal complex,Functional complex Executive Function: Organizing,Self Monitoring Organizing: Impaired Self Monitoring: Impaired Safety/Judgment: Impaired    Extremity Assessment (includes Sensation/Coordination)  Upper Extremity Assessment: RUE deficits/detail RUE Deficits / Details: trace muscle engagement at tricept and bicep. No active movement of  hand wrist elbow scapula or elbow. RUE Sensation: decreased light touch,decreased proprioception RUE Coordination: decreased fine motor,decreased gross motor  Lower Extremity Assessment: Defer to PT evaluation RLE Deficits / Details: able to perform hip abd/add for partial ROM, hip flexion contraction, and knee extension contraction in supine. No active DF/PF LLE Sensation: WNL    ADLs  Overall ADL's : Needs assistance/impaired Grooming: Wash/dry face,Maximal assistance,Standing Grooming Details (indicate cue type and reason): Pt standing at sink with Max A +2 for support at R knee and trunk. Faciltiating weight bearing through RUE at elbow and wrist. Pt washing his face with cues for sequencing and faciltiating weight bearing, crossing midling, weight shifts, and visual tracking. Upper Body Bathing: Maximal assistance Toilet Transfer: Moderate assistance,+2 for physical assistance,Stand-pivot Toilet Transfer Details (indicate cue type and reason): Simulated to recliner. Mod A +2 for pivot to recliner. Functional mobility during ADLs: Min guard,Rolling walker General ADL Comments: Pt motivated to participate. presenting with decreased control of RUE, balance, cognition, and strength    Mobility  Overal bed mobility: Needs Assistance Bed Mobility: Supine to Sit Supine to sit: Min assist Sit to supine: Max assist General bed mobility comments: min assist for completion of trunk elevation, RLE management. Exited bed towards R, PT assisting pt in weight shifting L/R for sequential scooting to EOB.    Transfers  Overall transfer level: Needs assistance Equipment used: 2 person hand held assist Transfers: Sit to/from Stand Sit to Stand: Mod assist,+2 physical assistance General transfer comment: mod +2 for power up, rise, steadying, RLE blocking to prevent severe buckling. Max tactile and verbal cuing to shift weight to L side, pt with preference for R lateral leaning. sit<>stand x3 from EOB  and recliner x2. Overall standing tolerance x3 mins    Ambulation / Gait / Stairs / Wheelchair Mobility  Ambulation/Gait Ambulation/Gait assistance: +2 physical assistance,Max assist Gait Distance (Feet): 3 Feet Assistive device: 2 person hand held assist Gait Pattern/deviations: Step-to pattern,Decreased stance time - right,Trunk flexed,Decreased step length - right General Gait Details: Max +2 for truncal support, correcting RLE buckling in stance, facilitiating weight shift L/R, and progressing RLE during swing phase of gait. Multiple episodes of buckling RLE corrected by PT given pt's prefernce for R truncal lean Gait velocity: decr    Posture / Balance Dynamic Sitting Balance Sitting balance - Comments: seated at EOB Balance Overall balance assessment: Needs assistance Sitting-balance support: Single extremity supported,Feet supported Sitting balance-Leahy Scale: Fair Sitting balance - Comments: seated at EOB Standing balance support: Single extremity supported,During functional activity Standing balance-Leahy Scale: Poor Standing balance comment: mod-max +2    Special needs/care consideration Hgb A1c 6.8 THC positive  Smoker     Previous Home Environment  Living Arrangements: Spouse/significant other  Lives With: Spouse Available Help at Discharge: Family,Available 24 hours/day (wife works days; sister can assist) Type of Home: Mobile home Home Layout: One level Home Access: Stairs to enter Entrance Stairs-Rails: Surveyor, mining of Steps: 3-4 Bathroom Shower/Tub: Paramedic: Yes Home Care Services: No  Discharge Living Setting Plans for Discharge Living Setting: Patient's home,Mobile Home,Lives with (comment) (wife) Type of Home at Discharge: Mobile home Discharge Home Layout: One level Discharge Home Access: Stairs to enter Entrance Stairs-Rails: Surveyor, mining of  Steps: 3-4 Discharge Bathroom Shower/Tub: Tub/shower unit,Curtain Discharge Bathroom Toilet: Standard Discharge Bathroom Accessibility: Yes How Accessible: Accessible via walker Does the patient have any problems obtaining your medications?: No  Social/Family/Support Systems Contact Information: Chief Financial Officer, wife Anticipated Caregiver: wife and sister Anticipated Ambulance person Information: see above Ability/Limitations of Caregiver: wife works day shift Caregiver Availability: 24/7 Discharge Plan Discussed with Primary Caregiver: Yes Is Caregiver In Agreement with Plan?: Yes Does Caregiver/Family have Issues with Lodging/Transportation while Pt is in Rehab?: No   Patient's sister and family will assist when wife working. Wife will take 1 week off when he first gets home to assist with home transition.  Goals Patient/Family Goal for Rehab: min PT and OT, supervision SLP Expected length of stay: ELOS 17-20 days. Pt/Family Agrees to Admission and willing to participate: Yes Program Orientation Provided & Reviewed with Pt/Caregiver Including Roles  & Responsibilities: Yes  Decrease burden of Care through IP rehab admission: n/a  Possible need for SNF placement upon discharge:not anticipated  Patient Condition: This patient's medical and functional status has changed since the consult dated: 02/19/2021 in which the Rehabilitation Physician determined and documented that the patient's condition is appropriate for intensive rehabilitative care in an inpatient rehabilitation facility. See "History of Present Illness" (above) for medical update. Functional changes are: overall mod assist. Patient's medical and functional status update has been discussed with the Rehabilitation physician and patient remains appropriate for inpatient rehabilitation. Will admit to inpatient rehab today.  Preadmission Screen Completed By:  Cleatrice Burke, RN, 02/21/2021 10:27  AM ______________________________________________________________________   Discussed status with Dr. Posey Pronto on 02/21/2021 at  1028 and received approval for admission today.  Admission Coordinator:  Cleatrice Burke, time 3086 Date 02/21/2021

## 2021-02-20 NOTE — TOC Benefit Eligibility Note (Signed)
Transition of Care [facility]) Benefit Eligibility Note    Patient Details  Name: Juan Wheeler MRN: [id number] Date of Birth: 08/18/1961   Medication/Dose: PRADAXA  150 MG BID  Covered?: Yes  Tier:  (TIER- 4 DRUG)  Prescription Coverage Preferred Pharmacy: [facility] [address]  Spoke with Person/Company/Phone Number:: [facility] @  [employer] # [phone]  Co-Pay: $99.00  Prior Approval: No  Deductible: Met (OUT-OF-POCKET:UNMET)  Additional Notes: PT'S HAS MEDICAID OF [place]  EFF-DATE : 01-26-2021  CO-PAY- $4.00 FOR EACH  PRECRIPTION    Memory Argue Phone Number: 02/20/2021, 10:32 AM

## 2021-02-20 NOTE — Progress Notes (Signed)
Inpatient Rehabilitation Admissions Coordinator  I have begun insurance Auth with [facility] for a possible admit pending their approval.  Danne Baxter, RN, MSN Rehab Admissions Coordinator [phone] 02/20/2021 11:08 AM

## 2021-02-20 NOTE — Consult Note (Signed)
   [facility] Inpatient Consult   02/20/2021  DENI LEFEVER [birth year] [id number]   [place] Organization [ACO] Patient: [employer]  Primary Care Provider: [name], MD,    Patient screened for less than 30 day re-hospitalization with noted high risk score for unplanned readmission risk.  Also,  to assess for potential [employer] Management service needs for post hospital transition.  Review of patient's medical record reveals patient is being recommended for an inpatient rehabilitation transition.  Plan:  Continue to follow progress and disposition to assess for post hospital care management needs.    For questions contact:   Natividad Brood, RN BSN [facility] Liaison  [phone] business mobile phone Toll free office [phone]  Fax number: [phone] [email or web address]

## 2021-02-20 NOTE — Progress Notes (Signed)
PROGRESS NOTE    Juan Wheeler  NFA:[id number] DOB: [birth year] DOA: 02/15/2021 PCP: [name], MD   Brief Narrative:  HPI on 02/15/21 by Dr. Heath Lark Juan Wheeler is a 60 y.o. male with medical history significant for squamous cell carcinoma of the lung, rectal adenocarcinoma, neuropathy, cardiomyopathy with LVEF 30-35%, bilateral PE on Eliquis, atrial fibrillation, and CAD/PAD who presented to the ED this morning after acute onset of diffuse weakness at home.  His last known well around 6:30 AM and symptoms began shortly after that.  He tried to use his urine with his right hand but it fell out of his hands and when he tried to pick it up from the floor with his other hand and he dropped it again.  His legs then became weak after he went to his couch.  He tried to get up but he could not do so on account of the weakness.  After few minutes of this he called his wife and he was noted to have a blank look on his face.  His wife called EMS and he was brought to the ED for further evaluation.  He states he has been compliant with his medications to include Eliquis daily.  He denies any fevers, chills, chest pain, shortness of breath, palpitations, abdominal pain, nausea, or vomiting.  He denies headache or vision changes.  Interim history Patient mated with acute CVA and profound weakness with amatory dysfunction.  Neurology was consulted.  Patient was placed on aspirin and Pradaxa.  Currently pending placement, either CIR or SNF. Assessment & Plan   Acute CVA -Patient presented with diffuse weakness -Concern for embolic origin given A. fib.-CT head showed no acute intracranial infarct or hemorrhage -MRI brain showed multifocal acute ischemia within both hemispheres, left greater than right, multiple vascular territories, largest area of abnormality left posterior insula.   -MRA brain and neck: occlusion of the left vertebral artery.  Otherwise normal intracranial MRA -Previous  echocardiogram on 01/17/2021 showed an EF of 30 to 35%, global hypokinesis with some regional variation, LV diastolic parameters indeterminate in the setting of A. Fib. -Hemoglobin A1c 6.8, LDL 99 -Continue statin, aspirin -Neurology consulted and appreciated-patient started on Pradaxa -PT and OT recommending CIR- pending on insurance authorization  Atrial fibrillation, chronic -Patient was on Eliquis prior to admission however given acute CVA, question compliance versus ineffectiveness -Continue Pradaxa -Continue metoprolol  Essential hypertension -Initially allowed for permissive hypertension -Continue metoprolol and PRN metoprolol for HR >120 -Cozaar and Imdur currently held  CAD/PAD -Currently stable, no complaints of chest pain -Continue metoprolol, aspirin, statin  History of rectal adenocarcinoma/squamous cell lung cancer -Follow-up with oncology as an outpatient  Marijuana use -Noted on urine drug screen -Counseled  Thrombocytopenia -No bleeding at this point, platelets have increased as compared to 02/19/2021 -Continue to monitor CBC  DVT Prophylaxis Pradaxa  Code Status: Full  Family Communication: None at bedside  Disposition Plan:  Status is: Inpatient  Remains inpatient appropriate because:Hemodynamically unstable and Inpatient level of care appropriate due to severity of illness   Dispo: The patient is from: Home              Anticipated d/c is to: CIR              Patient currently is not medically stable to d/c.   Difficult to place patient No   Consultants Neurology Inpatient rehab  Procedures  None  Antibiotics   Anti-infectives (From admission, onward)   None  Subjective:   Juan Wheeler seen and examined today.  Patient with no complaints this morning.  Feeling somewhat sleepy and weak.  Denies current chest pain or shortness of breath, abdominal pain, nausea or vomiting, diarrhea or constipation, dizziness or headache.  States he  did not sleep well overnight.   Objective:   Vitals:   02/19/21 2331 02/20/21 0315 02/20/21 0726 02/20/21 1019  BP: 128/89 113/82 123/88 115/76  Pulse: 86 80 75 84  Resp: 18 18 16    Temp: 98.1 F (36.7 C) 98 F (36.7 C) 97.8 F (36.6 C)   TempSrc: Oral Oral Oral   SpO2: 94% 93% 94%   Weight:      Height:        Intake/Output Summary (Last 24 hours) at 02/20/2021 1045 Last data filed at 02/20/2021 0348 Gross per 24 hour  Intake 240 ml  Output 350 ml  Net -110 ml   Filed Weights   02/15/21 0815  Weight: 71.2 kg   Exam  General: Well developed, well nourished, NAD, appears stated age  HEENT: NCAT, mucous membranes moist.   Cardiovascular: S1 S2 auscultated, irregular  Respiratory: Clear to auscultation bilaterally, no wheezing  Abdomen: Soft, nontender, nondistended, + bowel sounds  Extremities: warm dry without cyanosis clubbing or edema  Neuro: AAOx3, right-sided weakness, otherwise nonfocal  Psych: pleasant, appropriate mood and affect  Data Reviewed: I have personally reviewed following labs and imaging studies  CBC: Recent Labs  Lab 02/15/21 0833 02/15/21 0912 02/16/21 0445 02/17/21 0530 02/18/21 0139 02/19/21 0348 02/20/21 0551  WBC 7.3  --  6.2 5.8 6.8 6.7 7.2  NEUTROABS 5.8  --   --   --   --   --   --   HGB 15.1   < > 14.9 15.2 16.1 16.5 17.1*  HCT 45.0   < > 43.9 45.1 46.6 47.9 50.4  MCV 101.1*  --  99.5 98.5 97.1 96.6 97.7  PLT 134*  --  123* 125* 128* 110* 123*   < > = values in this interval not displayed.   Basic Metabolic Panel: Recent Labs  Lab 02/16/21 0445 02/17/21 0530 02/18/21 0139 02/19/21 0348 02/20/21 0551  NA 137 136 136 137 136  K 4.0 4.2 4.1 4.4 4.2  CL 106 104 102 103 101  CO2 26 26 24 24 24   GLUCOSE 88 94 101* 100* 110*  BUN 7 9 7 10 12   CREATININE 1.12 1.18 1.08 1.20 1.40*  CALCIUM 8.6* 8.7* 9.0 9.2 9.5  MG 1.8  --   --   --   --    GFR: Estimated Creatinine Clearance: 56.1 mL/min (A) (by C-G formula based  on SCr of 1.4 mg/dL (H)). Liver Function Tests: Recent Labs  Lab 02/15/21 0833  AST 28  ALT 25  ALKPHOS 118  BILITOT 0.9  PROT 7.1  ALBUMIN 3.1*   No results for input(s): LIPASE, AMYLASE in the last 168 hours. Recent Labs  Lab 02/15/21 1023  AMMONIA 12   Coagulation Profile: Recent Labs  Lab 02/15/21 0833  INR 1.3*   Cardiac Enzymes: No results for input(s): CKTOTAL, CKMB, CKMBINDEX, TROPONINI in the last 168 hours. BNP (last 3 results) No results for input(s): PROBNP in the last 8760 hours. HbA1C: No results for input(s): HGBA1C in the last 72 hours. CBG: No results for input(s): GLUCAP in the last 168 hours. Lipid Profile: No results for input(s): CHOL, HDL, LDLCALC, TRIG, CHOLHDL, LDLDIRECT in the last 72 hours. Thyroid Function Tests:  No results for input(s): TSH, T4TOTAL, FREET4, T3FREE, THYROIDAB in the last 72 hours. Anemia Panel: No results for input(s): VITAMINB12, FOLATE, FERRITIN, TIBC, IRON, RETICCTPCT in the last 72 hours. Urine analysis:    Component Value Date/Time   COLORURINE YELLOW 02/15/2021 [place] 02/15/2021 0955   LABSPEC 1.016 02/15/2021 0955   PHURINE 5.0 02/15/2021 0955   GLUCOSEU NEGATIVE 02/15/2021 0955   HGBUR NEGATIVE 02/15/2021 0955   BILIRUBINUR NEGATIVE 02/15/2021 [place] 02/15/2021 0955   PROTEINUR NEGATIVE 02/15/2021 0955   UROBILINOGEN 1.0 06/24/2015 0837   NITRITE NEGATIVE 02/15/2021 0955   LEUKOCYTESUR NEGATIVE 02/15/2021 0955   Sepsis Labs: @LABRCNTIP (procalcitonin:4,lacticidven:4)  ) Recent Results (from the past 240 hour(s))  Resp Panel by RT-PCR (Flu A&B, Covid) Nasopharyngeal Swab     Status: None   Collection Time: 02/15/21  8:41 AM   Specimen: Nasopharyngeal Swab; Nasopharyngeal(NP) swabs in vial transport medium  Result Value Ref Range Status   SARS Coronavirus 2 by RT PCR NEGATIVE NEGATIVE Final    Comment: (NOTE) SARS-CoV-2 target nucleic acids are NOT DETECTED.  The  SARS-CoV-2 RNA is generally detectable in upper respiratory specimens during the acute phase of infection. The lowest concentration of SARS-CoV-2 viral copies this assay can detect is 138 copies/mL. A negative result does not preclude SARS-Cov-2 infection and should not be used as the sole basis for treatment or other patient management decisions. A negative result may occur with  improper specimen collection/handling, submission of specimen other than nasopharyngeal swab, presence of viral mutation(s) within the areas targeted by this assay, and inadequate number of viral copies(<138 copies/mL). A negative result must be combined with clinical observations, patient history, and epidemiological information. The expected result is Negative.  Fact Sheet for Patients:  EntrepreneurPulse.com.au  Fact Sheet for Healthcare Providers:  IncredibleEmployment.be  This test is no t yet approved or cleared by the [place] FDA and  has been authorized for detection and/or diagnosis of SARS-CoV-2 by FDA under an Emergency Use Authorization (EUA). This EUA will remain  in effect (meaning this test can be used) for the duration of the COVID-19 declaration under Section 564(b)(1) of the Act, 21 U.S.C.section 360bbb-3(b)(1), unless the authorization is terminated  or revoked sooner.       Influenza A by PCR NEGATIVE NEGATIVE Final   Influenza B by PCR NEGATIVE NEGATIVE Final    Comment: (NOTE) The Xpert Xpress SARS-CoV-2/FLU/RSV plus assay is intended as an aid in the diagnosis of influenza from Nasopharyngeal swab specimens and should not be used as a sole basis for treatment. Nasal washings and aspirates are unacceptable for Xpert Xpress SARS-CoV-2/FLU/RSV testing.  Fact Sheet for Patients: EntrepreneurPulse.com.au  Fact Sheet for Healthcare Providers: IncredibleEmployment.be  This test is not yet approved or  cleared by the [place] FDA and has been authorized for detection and/or diagnosis of SARS-CoV-2 by FDA under an Emergency Use Authorization (EUA). This EUA will remain in effect (meaning this test can be used) for the duration of the COVID-19 declaration under Section 564(b)(1) of the Act, 21 U.S.C. section 360bbb-3(b)(1), unless the authorization is terminated or revoked.  Performed at [facility], [address] [place], [place] [address]   MRSA PCR Screening     Status: None   Collection Time: 02/18/21 12:30 PM   Specimen: Nasal Mucosa; Nasopharyngeal  Result Value Ref Range Status   MRSA by PCR NEGATIVE NEGATIVE Final    Comment:        The GeneXpert  MRSA Assay (FDA approved for NASAL specimens only), is one component of a comprehensive MRSA colonization surveillance program. It is not intended to diagnose MRSA infection nor to guide or monitor treatment for MRSA infections. Performed at [facility], [place] [address], [place], [place] [address]       Radiology Studies: No results found.   Scheduled Meds: . aspirin EC  81 mg Oral Daily  . Chlorhexidine Gluconate Cloth  6 each Topical Daily  . dabigatran  150 mg Oral Q12H  . metoprolol succinate  50 mg Oral Daily  . pantoprazole  40 mg Oral Daily  . simvastatin  40 mg Oral q1800   Continuous Infusions:   LOS: 5 days   Time Spent in minutes   30 minutes  Valicia Rief D.O. on 02/20/2021 at 10:45 AM  Between 7am to 7pm - Please see pager noted on [email or web address]  After 7pm go to [email or web address]  And look for the night coverage person covering for me after hours  [facility] Office  [phone]

## 2021-02-20 NOTE — Progress Notes (Signed)
  Speech Language Pathology Treatment: Cognitive-Linquistic  Patient Details Name: Juan Wheeler MRN: [id number] DOB: Jan 04, 1961 Today's Date: 02/20/2021 Time: 2641-5830 SLP Time Calculation (min) (ACUTE ONLY): 15 min  Assessment / Plan / Recommendation Clinical Impression  Pt seen for cognitive linguistic intervention at bedside. SLP educated upon strategies for dysarthria including slow, loud, over artictulation, and pausing. Pt required moderate cues for implementation of strategies during structured reading tasks. Communication at the phrase at sentence level remains unintelligible in approximately 45 percent of spontaneous speech without strategies. Pt oriented x4 this date with environmental visual aids. Pt with delayed recall of daily event, however struggled with details of prior task. Discussed functional strategies for improved safety at home. Continued ST intervention indicated for cognitive linguistics.    HPI HPI: 60 y.o. male presenting to ED on 5/21 with acute onset of diffuse weakness. MRI (+) multifocal acute ischemia within both hemispheres (L >R) with largest area at posterior L insula suggestive of embolic process in setting of A-fib. Patient also with occlusion of L vertebral artery. CT 5/23Areas of acute infarction on recent MRI are now partially visible on  CT.PMHx significant for lung cancer, rectal cancer, neuropathy, cardiomyopathy, A-fib and CAD/PAD       SLP Plan  Continue with current plan of care       Recommendations                   Oral Care Recommendations: Oral care BID Follow up Recommendations: Inpatient Rehab;Home health SLP SLP Visit Diagnosis: Aphasia (R47.01);Dysarthria and anarthria (R47.1);Cognitive communication deficit (R41.841) Plan: Continue with current plan of care       [place], [place]   02/20/2021, 2:33 PM

## 2021-02-20 NOTE — Plan of Care (Signed)

## 2021-02-20 NOTE — Progress Notes (Addendum)
Inpatient Rehabilitation Admissions Coordinator  I have received insurance approval from [facility] for CIR admit. I await bed availability.  Danne Baxter, RN, MSN Rehab Admissions Coordinator [phone] 02/20/2021 3:04 PM

## 2021-02-21 ENCOUNTER — Other Ambulatory Visit: Payer: Self-pay

## 2021-02-21 ENCOUNTER — Encounter (HOSPITAL_COMMUNITY): Payer: Self-pay | Admitting: Physical Medicine and Rehabilitation

## 2021-02-21 ENCOUNTER — Inpatient Hospital Stay (HOSPITAL_COMMUNITY)
Admission: RE | Admit: 2021-02-21 | Discharge: 2021-03-12 | DRG: 057 | Disposition: A | Discharge status: Home Health | Payer: Medicare PPO | Source: Intra-hospital | Attending: Physical Medicine and Rehabilitation | Admitting: Physical Medicine and Rehabilitation

## 2021-02-21 DIAGNOSIS — I639 Cerebral infarction, unspecified: Secondary | ICD-10-CM | POA: Diagnosis not present

## 2021-02-21 DIAGNOSIS — C3491 Malignant neoplasm of unspecified part of right bronchus or lung: Secondary | ICD-10-CM

## 2021-02-21 DIAGNOSIS — Z7982 Long term (current) use of aspirin: Secondary | ICD-10-CM | POA: Diagnosis not present

## 2021-02-21 DIAGNOSIS — Z85048 Personal history of other malignant neoplasm of rectum, rectosigmoid junction, and anus: Secondary | ICD-10-CM

## 2021-02-21 DIAGNOSIS — R0902 Hypoxemia: Secondary | ICD-10-CM | POA: Diagnosis not present

## 2021-02-21 DIAGNOSIS — I739 Peripheral vascular disease, unspecified: Secondary | ICD-10-CM | POA: Diagnosis present

## 2021-02-21 DIAGNOSIS — I69390 Apraxia following cerebral infarction: Secondary | ICD-10-CM | POA: Diagnosis not present

## 2021-02-21 DIAGNOSIS — I69322 Dysarthria following cerebral infarction: Secondary | ICD-10-CM

## 2021-02-21 DIAGNOSIS — I749 Embolism and thrombosis of unspecified artery: Secondary | ICD-10-CM | POA: Diagnosis not present

## 2021-02-21 DIAGNOSIS — I2510 Atherosclerotic heart disease of native coronary artery without angina pectoris: Secondary | ICD-10-CM | POA: Diagnosis present

## 2021-02-21 DIAGNOSIS — E785 Hyperlipidemia, unspecified: Secondary | ICD-10-CM | POA: Diagnosis present

## 2021-02-21 DIAGNOSIS — Z85118 Personal history of other malignant neoplasm of bronchus and lung: Secondary | ICD-10-CM | POA: Diagnosis not present

## 2021-02-21 DIAGNOSIS — Z933 Colostomy status: Secondary | ICD-10-CM | POA: Diagnosis not present

## 2021-02-21 DIAGNOSIS — Z86718 Personal history of other venous thrombosis and embolism: Secondary | ICD-10-CM

## 2021-02-21 DIAGNOSIS — I69351 Hemiplegia and hemiparesis following cerebral infarction affecting right dominant side: Secondary | ICD-10-CM | POA: Diagnosis not present

## 2021-02-21 DIAGNOSIS — D696 Thrombocytopenia, unspecified: Secondary | ICD-10-CM | POA: Diagnosis not present

## 2021-02-21 DIAGNOSIS — K219 Gastro-esophageal reflux disease without esophagitis: Secondary | ICD-10-CM | POA: Diagnosis present

## 2021-02-21 DIAGNOSIS — Z955 Presence of coronary angioplasty implant and graft: Secondary | ICD-10-CM

## 2021-02-21 DIAGNOSIS — Z8711 Personal history of peptic ulcer disease: Secondary | ICD-10-CM | POA: Diagnosis not present

## 2021-02-21 DIAGNOSIS — E871 Hypo-osmolality and hyponatremia: Secondary | ICD-10-CM | POA: Diagnosis present

## 2021-02-21 DIAGNOSIS — R531 Weakness: Secondary | ICD-10-CM | POA: Diagnosis not present

## 2021-02-21 DIAGNOSIS — C20 Malignant neoplasm of rectum: Secondary | ICD-10-CM

## 2021-02-21 DIAGNOSIS — M549 Dorsalgia, unspecified: Secondary | ICD-10-CM | POA: Diagnosis present

## 2021-02-21 DIAGNOSIS — R5381 Other malaise: Secondary | ICD-10-CM | POA: Diagnosis not present

## 2021-02-21 DIAGNOSIS — F1210 Cannabis abuse, uncomplicated: Secondary | ICD-10-CM | POA: Diagnosis not present

## 2021-02-21 DIAGNOSIS — G629 Polyneuropathy, unspecified: Secondary | ICD-10-CM | POA: Diagnosis present

## 2021-02-21 DIAGNOSIS — Z9049 Acquired absence of other specified parts of digestive tract: Secondary | ICD-10-CM | POA: Diagnosis not present

## 2021-02-21 DIAGNOSIS — R4189 Other symptoms and signs involving cognitive functions and awareness: Secondary | ICD-10-CM | POA: Diagnosis present

## 2021-02-21 DIAGNOSIS — G459 Transient cerebral ischemic attack, unspecified: Secondary | ICD-10-CM | POA: Diagnosis not present

## 2021-02-21 DIAGNOSIS — N179 Acute kidney failure, unspecified: Secondary | ICD-10-CM | POA: Diagnosis not present

## 2021-02-21 DIAGNOSIS — I429 Cardiomyopathy, unspecified: Secondary | ICD-10-CM | POA: Diagnosis present

## 2021-02-21 DIAGNOSIS — I10 Essential (primary) hypertension: Secondary | ICD-10-CM | POA: Diagnosis not present

## 2021-02-21 DIAGNOSIS — Z8249 Family history of ischemic heart disease and other diseases of the circulatory system: Secondary | ICD-10-CM

## 2021-02-21 DIAGNOSIS — I2699 Other pulmonary embolism without acute cor pulmonale: Secondary | ICD-10-CM | POA: Diagnosis not present

## 2021-02-21 DIAGNOSIS — Z86711 Personal history of pulmonary embolism: Secondary | ICD-10-CM

## 2021-02-21 DIAGNOSIS — Z743 Need for continuous supervision: Secondary | ICD-10-CM | POA: Diagnosis not present

## 2021-02-21 DIAGNOSIS — I5042 Chronic combined systolic (congestive) and diastolic (congestive) heart failure: Secondary | ICD-10-CM | POA: Diagnosis not present

## 2021-02-21 DIAGNOSIS — I482 Chronic atrial fibrillation, unspecified: Secondary | ICD-10-CM | POA: Diagnosis not present

## 2021-02-21 DIAGNOSIS — F17210 Nicotine dependence, cigarettes, uncomplicated: Secondary | ICD-10-CM | POA: Diagnosis present

## 2021-02-21 DIAGNOSIS — Z923 Personal history of irradiation: Secondary | ICD-10-CM

## 2021-02-21 DIAGNOSIS — I4891 Unspecified atrial fibrillation: Secondary | ICD-10-CM | POA: Diagnosis not present

## 2021-02-21 DIAGNOSIS — Z7901 Long term (current) use of anticoagulants: Secondary | ICD-10-CM

## 2021-02-21 DIAGNOSIS — G8929 Other chronic pain: Secondary | ICD-10-CM | POA: Diagnosis present

## 2021-02-21 DIAGNOSIS — Z9221 Personal history of antineoplastic chemotherapy: Secondary | ICD-10-CM

## 2021-02-21 DIAGNOSIS — Z79899 Other long term (current) drug therapy: Secondary | ICD-10-CM

## 2021-02-21 LAB — CBC
HCT: 47.7 % (ref 39.0–52.0)
Hemoglobin: 16.6 g/dL (ref 13.0–17.0)
MCH: 33.5 pg (ref 26.0–34.0)
MCHC: 34.8 g/dL (ref 30.0–36.0)
MCV: 96.4 fL (ref 80.0–100.0)
Platelets: 130 10*3/uL — ABNORMAL LOW (ref 150–400)
RBC: 4.95 MIL/uL (ref 4.22–5.81)
RDW: 15.4 % (ref 11.5–15.5)
WBC: 7.8 10*3/uL (ref 4.0–10.5)
nRBC: 0 % (ref 0.0–0.2)

## 2021-02-21 LAB — BASIC METABOLIC PANEL
Anion gap: 8 (ref 5–15)
BUN: 16 mg/dL (ref 6–20)
CO2: 25 mmol/L (ref 22–32)
Calcium: 9.3 mg/dL (ref 8.9–10.3)
Chloride: 104 mmol/L (ref 98–111)
Creatinine, Ser: 1.5 mg/dL — ABNORMAL HIGH (ref 0.61–1.24)
GFR, Estimated: 53 mL/min — ABNORMAL LOW (ref >60)
Glucose, Bld: 104 mg/dL — ABNORMAL HIGH (ref 70–99)
Potassium: 4.7 mmol/L (ref 3.5–5.1)
Sodium: 137 mmol/L (ref 135–145)

## 2021-02-21 MED ORDER — ASPIRIN EC 81 MG PO TBEC
81.0000 mg | DELAYED_RELEASE_TABLET | Freq: Every day | ORAL | Status: DC
  Administered 2021-02-22 – 2021-03-12 (×19): 81 mg via ORAL
  Filled 2021-02-21 (×20): qty 1

## 2021-02-21 MED ORDER — SIMVASTATIN 40 MG PO TABS: 40.0000 mg | ORAL_TABLET | Freq: Every day | ORAL | Status: DC

## 2021-02-21 MED ORDER — DABIGATRAN ETEXILATE MESYLATE 150 MG PO CAPS
150.0000 mg | ORAL_CAPSULE | Freq: Two times a day (BID) | ORAL | Status: DC
  Administered 2021-02-21 – 2021-03-12 (×38): 150 mg via ORAL
  Filled 2021-02-21 (×40): qty 1

## 2021-02-21 MED ORDER — DABIGATRAN ETEXILATE MESYLATE 150 MG PO CAPS
150.0000 mg | ORAL_CAPSULE | Freq: Two times a day (BID) | ORAL | Status: DC
Start: 2021-02-21 — End: 2021-03-11

## 2021-02-21 MED ORDER — METOPROLOL SUCCINATE ER 50 MG PO TB24
50.0000 mg | ORAL_TABLET | Freq: Every day | ORAL | Status: DC
  Administered 2021-02-22 – 2021-02-24 (×3): 50 mg via ORAL
  Filled 2021-02-21 (×5): qty 1

## 2021-02-21 MED ORDER — HYDROCODONE-ACETAMINOPHEN 10-325 MG PO TABS
1.0000 | ORAL_TABLET | ORAL | Status: DC | PRN
  Administered 2021-02-21 – 2021-03-11 (×42): 1 via ORAL
  Filled 2021-02-21 (×43): qty 1

## 2021-02-21 MED ORDER — ACETAMINOPHEN 325 MG PO TABS: 325.0000 mg | ORAL_TABLET | ORAL | Status: DC | PRN

## 2021-02-21 MED ORDER — METOPROLOL SUCCINATE ER 50 MG PO TB24: 50.0000 mg | ORAL_TABLET | Freq: Every day | ORAL | Status: DC

## 2021-02-21 MED ORDER — SIMVASTATIN 20 MG PO TABS
40.0000 mg | ORAL_TABLET | Freq: Every day | ORAL | Status: DC
  Administered 2021-02-21 – 2021-03-11 (×19): 40 mg via ORAL
  Filled 2021-02-21 (×20): qty 2

## 2021-02-21 MED ORDER — PANTOPRAZOLE SODIUM 40 MG PO TBEC
40.0000 mg | DELAYED_RELEASE_TABLET | Freq: Every day | ORAL | Status: DC
  Administered 2021-02-22 – 2021-03-09 (×16): 40 mg via ORAL
  Filled 2021-02-21 (×17): qty 1

## 2021-02-21 NOTE — Progress Notes (Signed)
Per telemetry pt had 10 beat run of [place] at 0645. Murray Hodgkins, RN the care nurse informed by Probation officer.

## 2021-02-21 NOTE — Progress Notes (Signed)
Inpatient Rehabilitation Medication Review by a Pharmacist  A complete drug regimen review was completed for this patient to identify any potential clinically significant medication issues.  Clinically significant medication issues were identified:  no  Check AMION for pharmacist assigned to patient if future medication questions/issues arise during this admission.  Pharmacist comments: DC note said to con durvalumab but med has been put on hold indefinitely per onc.   Time spent performing this drug regimen review (minutes):  10

## 2021-02-21 NOTE — H&P (Signed)
Physical Medicine and Rehabilitation Admission H&P    Chief Complaint  Patient presents with  . Weakness  : HPI: Juan Wheeler is a 60 year old right-handed male with history of squamous cell carcinoma of the lung, rectal adenocarcinoma, neuropathy, cardiomyopathy with ejection fraction of 30 to 35%, bilateral pulmonary emboli on Eliquis, atrial fibrillation, CAD/PAD, hypertension, tobacco abuse, chronic back pain followed at [facility].  History taken from chart review and patient.  Patient lives with spouse and independent prior to admission.  1 level home 3-4 steps to entry.  He presented on 5/29 with weakness, dysarthria, and left gaze preference.  CT/MRI as well as MRA showed multifocal acute ischemia within bilateral left greater than right multiple vascular territories.  The largest area of abnormality at the posterior left insula.  Pattern most suggestive of embolic process.  Occlusion of left vertebral artery with distal reconstitution due to flow across the vertebrobasilar confluence.  Normal intracranial MRA.  MRI cervical spine showed mild spinal canal stenosis and bilateral neuroforaminal stenosis C5-6.  Mild left C2-3 3-4 and C6-7 neuroforaminal stenosis.  Admission chemistries unremarkable except urine drug screen positive for marijuana.  Recent echocardiogram with ejection fraction of 30-35%, global hypokinesis.  Neurology follow-up currently maintained on low-dose aspirin as well as Pradaxa for CVA prophylaxis.  Tolerating a regular diet.  Due to patient's right hemiparesis, dysarthria, and cognitive dysfunction was admitted for a comprehensive rehab program.  Please see preadmission assessment from earlier today as well.  Review of Systems  Constitutional: Positive for malaise/fatigue. Negative for chills and fever.  HENT: Negative for hearing loss.   Eyes: Negative for blurred vision and double vision.  Respiratory: Negative for cough.        Occasional shortness of  breath with exertion  Cardiovascular: Positive for palpitations.  Gastrointestinal: Positive for constipation. Negative for heartburn, nausea and vomiting.       GERD  Genitourinary: Negative for hematuria.  Musculoskeletal: Positive for joint pain and myalgias.  Skin: Negative for rash.  Neurological: Positive for speech change, focal weakness and weakness.  All other systems reviewed and are negative.  Past Medical History:  Diagnosis Date  . Chronic lower back pain    a. Followed by pain management at [facility].  . Colon cancer [facility])    rectal cancer  . Coronary artery disease    a. 03/2013: abnl nuc -> LHC s/p DES to LCx, residual moderate disease in LAD (med rx unless refractory angina). b. Not on BB due to bradycardia.  Marland Kitchen DVT (deep venous thrombosis) ([place]) ~ 2013  . Dysrhythmia    AFib  . GERD (gastroesophageal reflux disease)   . H/O necrotising fasciitis   . History of blood transfusion    "once; after throwing up alot of blood" (04/17/2013)  . Hypertension   . LV dysfunction    a. EF 45% in 03/2013.  Marland Kitchen PAD (peripheral artery disease) ([place])    a. Occlusion of the right internal iliac artery, with significant atherosclerosis in the left internal iliac which was not amenable to reconstruction per notes from [place] in place 05/30/2020  . Pulmonary nodules 09/30/2014  . Rectal cancer ([place])   . Tobacco abuse    Past Surgical History:  Procedure Laterality Date  . ABDOMINAL SURGERY  1990's   'for stomach ulcers" (04/17/2013)  . COLECTOMY  2012   "for rectal cancer" (04/17/2013)  . COLONOSCOPY  2013   Dr. Cheryll Cockayne: colorectal anastomosis with ulcer  and inflammation, benign biopsy  . COLONOSCOPY WITH PROPOFOL N/A 09/07/2016   Procedure: COLONOSCOPY WITH PROPOFOL;  Surgeon: Daneil Dolin, MD;  [facility];  Service: Endoscopy;  Laterality: N/A;  10:00 am Colonoscopy via rectum and ostomy  . COLOSTOMY TAKEDOWN  2013  . CORONARY ANGIOPLASTY  WITH STENT PLACEMENT  04/17/2013   "?1" (04/17/2013)  . FEMORAL-POPLITEAL BYPASS GRAFT Left 07/02/2015   Procedure: BYPASS GRAFT LEFT COMMON FEMORAL ARTERY TO LEFT ABOVE KNEE POPLITEAL ARTERY - USING LEFT GREATER SAPPHENOUS VEIN;  Surgeon: Elam Dutch, MD;  Location: [place];  Service: Vascular;  Laterality: Left;  . INCISION AND DRAINAGE ABSCESS Left 06/05/2016   Procedure: INCISION AND DRAINAGE ABSCESS;  Surgeon: Aviva Signs, MD;  Location: [facility] OR[facility];  Service: General;  Laterality: Left;  . INCISION AND DRAINAGE PERIRECTAL ABSCESS Left 06/07/2016   Procedure: IRRIGATION AND DEBRIDEMENT LEFT BUTTOCK ABSCESS;  Surgeon: Greer Pickerel, MD;  Location: [place];  Service: General;  Laterality: Left;  . INGUINAL HERNIA REPAIR Bilateral 1990's  . LAPAROSCOPIC PARTIAL COLECTOMY N/A 06/11/2016   Procedure: LAPAROSCOPIC  OPEN COLOSTOMY;  Surgeon: [name], MD;  Location: [place];  Service: General;  Laterality: N/A;  . LEFT HEART CATHETERIZATION WITH CORONARY ANGIOGRAM N/A 04/17/2013   Procedure: LEFT HEART CATHETERIZATION WITH CORONARY ANGIOGRAM;  Surgeon: Peter M Martinique, MD;  Location: [facility] CATH LAB;  Service: Cardiovascular;  Laterality: N/A;  . PERCUTANEOUS STENT INTERVENTION  04/17/2013   Procedure: PERCUTANEOUS STENT INTERVENTION;  Surgeon: Peter M Martinique, MD;  Location: [facility] CATH LAB;  Service: Cardiovascular;;  . PERIPHERAL VASCULAR CATHETERIZATION N/A 06/14/2015   Procedure: Abdominal Aortogram;  Surgeon: Elam Dutch, MD;  Location: [place] CV LAB;  Service: Cardiovascular;  Laterality: N/A;  . PERIPHERAL VASCULAR CATHETERIZATION Bilateral 06/14/2015   Procedure: Lower Extremity Angiography;  Surgeon: Elam Dutch, MD;  Location: [place] CV LAB;  Service: Cardiovascular;  Laterality: Bilateral;  . PORTACATH PLACEMENT Left 06/05/2020   Procedure: INSERTION PORT-A-CATH;  Surgeon: Aviva Signs, MD;  Location: [facility];  Service: General;  Laterality: Left;  Marland Kitchen VEIN HARVEST Left 07/02/2015    Procedure: VEIN HARVEST - LEFT GREATER SAPPHENOUS VEIN;  Surgeon: Elam Dutch, MD;  Location[facility] OR;  Service: Vascular;  Laterality: Left;   Family History  Problem Relation Age of Onset  . Cancer Mother   . Hypertension Mother   . Bleeding Disorder Brother    Social History:  reports that he has been smoking cigarettes. He started smoking about 46 years ago. He has a 10.00 pack-year smoking history. He has never used smokeless tobacco. He reports current alcohol use of about 3.0 standard drinks of alcohol per week. He reports current drug use. Drug: Marijuana. Allergies:  Allergies  Allergen Reactions  . Tramadol Other (See Comments)    Felt sluggish and ineffective   . Darvocet [Propoxyphene N-Acetaminophen] Palpitations   Medications Prior to Admission  Medication Sig Dispense Refill  . apixaban (ELIQUIS) 5 MG TABS tablet Take 1 tablet (5 mg total) by mouth 2 (two) times daily. 60 tablet 1  . aspirin EC 81 MG tablet Take 81 mg by mouth daily.    [name] Oris IV Inject into the vein every 14 (fourteen) days.    Marland Kitchen HYDROcodone-acetaminophen (NORCO) 10-325 MG tablet Take 1 tablet by mouth every 4 (four) hours as needed. (Patient taking differently: Take 1 tablet by mouth every 4 (four) hours as needed for severe pain.) 180 tablet 0  . isosorbide mononitrate (IMDUR) 30  MG 24 hr tablet TAKE 1/2 TABLET BY MOUTH ONCE DAILY. (Patient taking differently: Take 15 mg by mouth daily.) 15 tablet 3  . losartan (COZAAR) 25 MG tablet Take 0.5 tablets (12.5 mg total) by mouth daily. 30 tablet 1  . metoprolol succinate (TOPROL-XL) 25 MG 24 hr tablet Take 1 tablet (25 mg total) by mouth daily. 30 tablet 1  . NITROSTAT 0.4 MG SL tablet PLACE 1 TABLET UNDER THE TONGUE AS NEEDED FOR CHEST PAIN UP TO 3 DOSES (Patient taking differently: Place 0.4 mg under the tongue every 5 (five) minutes as needed for chest pain.) 25 tablet 3  . omeprazole (PRILOSEC) 40 MG capsule Take 40 mg by mouth daily.    Marland Kitchen  oxyCODONE-acetaminophen (PERCOCET) 10-325 MG tablet Take 1 tablet by mouth every 6 (six) hours as needed for pain. 60 tablet 0    Drug Regimen Review Drug regimen was reviewed and remains appropriate with no significant issues identified  Home: Home Living Family/patient expects to be discharged to:: Private residence Living Arrangements: Spouse/significant other Available Help at Discharge: Family,Available 24 hours/day (wife works days; sister can assist) Type of Home: Mobile home Home Access: Stairs to enter Technical brewer of Steps: 3-4 Entrance Stairs-Rails: Right,Left Home Layout: One level Bathroom Shower/Tub: Engineer, petroleum: Standard Bathroom Accessibility: Yes Home Equipment: Cane - single point,Hand held shower head  Lives With: Spouse   Functional History: Prior Function Level of Independence: Independent Comments: I with ADLs/IADLs without AD; drives; shared responsibility with wife for IADLs  Functional Status:  Mobility: Bed Mobility Overal bed mobility: Needs Assistance Bed Mobility: Supine to Sit Supine to sit: Min assist Sit to supine: Max assist General bed mobility comments: min assist for completion of trunk elevation, RLE management. Exited bed towards R, PT assisting pt in weight shifting L/R for sequential scooting to EOB. Transfers Overall transfer level: Needs assistance Equipment used: 2 person hand held assist Transfers: Sit to/from Stand Sit to Stand: Mod assist,+2 physical assistance General transfer comment: mod +2 for power up, rise, steadying, RLE blocking to prevent severe buckling. Max tactile and verbal cuing to shift weight to L side, pt with preference for R lateral leaning. sit<>stand x3 from EOB and recliner x2. Overall standing tolerance x3 mins Ambulation/Gait Ambulation/Gait assistance: +2 physical assistance,Max assist Gait Distance (Feet): 3 Feet Assistive device: 2 person hand held assist Gait  Pattern/deviations: Step-to pattern,Decreased stance time - right,Trunk flexed,Decreased step length - right General Gait Details: Max +2 for truncal support, correcting RLE buckling in stance, facilitiating weight shift L/R, and progressing RLE during swing phase of gait. Multiple episodes of buckling RLE corrected by PT given pt's prefernce for R truncal lean Gait velocity: decr    ADL: ADL Overall ADL's : Needs assistance/impaired Grooming: Wash/dry face,Maximal assistance,Standing Grooming Details (indicate cue type and reason): Pt standing at sink with Max A +2 for support at R knee and trunk. Faciltiating weight bearing through RUE at elbow and wrist. Pt washing his face with cues for sequencing and faciltiating weight bearing, crossing midling, weight shifts, and visual tracking. Upper Body Bathing: Maximal assistance Toilet Transfer: Moderate assistance,+2 for physical assistance,Stand-pivot Toilet Transfer Details (indicate cue type and reason): Simulated to recliner. Mod A +2 for pivot to recliner. Functional mobility during ADLs: Min guard,Rolling walker General ADL Comments: Pt motivated to participate. presenting with decreased control of RUE, balance, cognition, and strength  Cognition: Cognition Overall Cognitive Status: Impaired/Different from baseline Arousal/Alertness: Awake/alert Orientation Level: Oriented X4 Attention: Focused,Sustained Focused Attention:  Impaired Focused Attention Impairment: Verbal complex,Functional complex Sustained Attention: Impaired Sustained Attention Impairment: Verbal complex,Functional complex Memory: Impaired Memory Impairment: Decreased recall of new information,Decreased short term memory Awareness: Impaired Problem Solving: Impaired Problem Solving Impairment: Verbal complex,Functional complex Executive Function: Organizing,Self Monitoring Organizing: Impaired Self Monitoring: Impaired Safety/Judgment:  Impaired Cognition Arousal/Alertness: Awake/alert Behavior During Therapy: WFL for tasks assessed/performed Overall Cognitive Status: Impaired/Different from baseline Area of Impairment: Attention,Following commands,Problem solving Current Attention Level: Sustained Following Commands: Follows one step commands consistently Problem Solving: Slow processing,Requires verbal cues,Difficulty sequencing General Comments: Cues for sequencing of washing face and attending to task. Cues for problem solving weight shifting  Physical Exam: Blood pressure 118/73, pulse 88, temperature 98.3 F (36.8 C), temperature source Oral, resp. rate 18, height 5\' 9"  (1.753 m), weight 71.2 kg, SpO2 93 %. Physical Exam Vitals reviewed.  Constitutional:      General: He is not in acute distress.    Appearance: He is not ill-appearing.  HENT:     Head: Normocephalic and atraumatic.     Right Ear: External ear normal.     Left Ear: External ear normal.     Nose: Nose normal.  Eyes:     General:        Right eye: No discharge.        Left eye: No discharge.     Extraocular Movements: Extraocular movements intact.  Cardiovascular:     Rate and Rhythm: Normal rate and regular rhythm.  Pulmonary:     Effort: Pulmonary effort is normal. No respiratory distress.     Breath sounds: No stridor.  Abdominal:     General: Abdomen is flat. Bowel sounds are normal. There is no distension.  Musculoskeletal:     Cervical back: Normal range of motion and neck supple.     Comments: No edema or tenderness in extremities  Skin:    Comments: No edema or tenderness in extremities  Neurological:     Mental Status: He is alert.     Comments: Alert No acute distress.   Makes eye contact with examiner.   Left gaze preference.   Mild dysarthria but intelligible.   Provides name and age.  Follows commands. Motor: LUE/LLE: 5/5 proximal distal RUE: 1+-2 -/5 proximal to distal with apraxia RLE: 0/5 proximal  distal Increase in tone right lower extremity  Psychiatric:        Speech: Speech is slurred.        Behavior: Behavior is slowed.     Results for orders placed or performed during the hospital encounter of 02/15/21 (from the past 48 hour(s))  CBC     Status: Abnormal   Collection Time: 02/20/21  5:51 AM  Result Value Ref Range   WBC 7.2 4.0 - 10.5 K/uL   RBC 5.16 4.22 - 5.81 MIL/uL   Hemoglobin 17.1 (H) 13.0 - 17.0 g/dL   HCT 50.4 39.0 - 52.0 %   MCV 97.7 80.0 - 100.0 fL   MCH 33.1 26.0 - 34.0 pg   MCHC 33.9 30.0 - 36.0 g/dL   RDW 15.4 11.5 - 15.5 %   Platelets 123 (L) 150 - 400 K/uL    Comment: REPEATED TO VERIFY   nRBC 0.0 0.0 - 0.2 %    Comment: Performed at [facility], [place] [address], [place], [place] [address]  Basic metabolic panel     Status: Abnormal   Collection Time: 02/20/21  5:51 AM  Result Value Ref Range   Sodium 136  135 - 145 mmol/L   Potassium 4.2 3.5 - 5.1 mmol/L   Chloride 101 98 - 111 mmol/L   CO2 24 22 - 32 mmol/L   Glucose, Bld 110 (H) 70 - 99 mg/dL    Comment: Glucose reference range applies only to samples taken after fasting for at least 8 hours.   BUN 12 6 - 20 mg/dL   Creatinine, Ser 1.40 (H) 0.61 - 1.24 mg/dL   Calcium 9.5 8.9 - 10.3 mg/dL   GFR, Estimated 58 (L) >60 mL/min    Comment: (NOTE) Calculated using the CKD-EPI Creatinine Equation (2021)    Anion gap 11 5 - 15    Comment: Performed at [facility] [address], [place], [place] [address]  CBC     Status: Abnormal   Collection Time: 02/21/21  6:03 AM  Result Value Ref Range   WBC 7.8 4.0 - 10.5 K/uL   RBC 4.95 4.22 - 5.81 MIL/uL   Hemoglobin 16.6 13.0 - 17.0 g/dL   HCT 47.7 39.0 - 52.0 %   MCV 96.4 80.0 - 100.0 fL   MCH 33.5 26.0 - 34.0 pg   MCHC 34.8 30.0 - 36.0 g/dL   RDW 15.4 11.5 - 15.5 %   Platelets 130 (L) 150 - 400 K/uL    Comment: REPEATED TO VERIFY   nRBC 0.0 0.0 - 0.2 %    Comment: Performed at [facility], [place] [address], [place],  [place] [address]  Basic metabolic panel     Status: Abnormal   Collection Time: 02/21/21  6:03 AM  Result Value Ref Range   Sodium 137 135 - 145 mmol/L   Potassium 4.7 3.5 - 5.1 mmol/L   Chloride 104 98 - 111 mmol/L   CO2 25 22 - 32 mmol/L   Glucose, Bld 104 (H) 70 - 99 mg/dL    Comment: Glucose reference range applies only to samples taken after fasting for at least 8 hours.   BUN 16 6 - 20 mg/dL   Creatinine, Ser 1.50 (H) 0.61 - 1.24 mg/dL   Calcium 9.3 8.9 - 10.3 mg/dL   GFR, Estimated 53 (L) >60 mL/min    Comment: (NOTE) Calculated using the CKD-EPI Creatinine Equation (2021)    Anion gap 8 5 - 15    Comment: Performed at [facility] [address] [place], [place] [address]   No results found.     Medical Problem List and Plan: 1.  Dysarthria/right hemiparesis with left gaze preference secondary to multifocal acute ischemia within the bilateral, left greater than right multiple vascular territories due to left vertebral artery occlusion.  -patient may shower  -ELOS/Goals: 14-17 days/supervision/min a  Admit to CIR 2.  Antithrombotics: -DVT/anticoagulation: Pradaxa  -antiplatelet therapy: Aspirin 81 mg daily 3. Pain Management: Hydrocodone as needed 4. Mood: Provide emotional support  -antipsychotic agents: N/A 5. Neuropsych: This patient is capable of making decisions on his own behalf. 6. Skin/Wound Care: Routine skin checks 7. Fluids/Electrolytes/Nutrition: Routine in and outs  CMP ordered 8.  Hypertension.  Toprol-XL 50 mg daily.    Monitor with increased mobility 9.  Hyperlipidemia: Zocor 10.  History of squamous cell carcinoma of the lung/rectal adenocarcinoma.  Follow-up outpatient 11.  Cardiomyopathy with history of bilateral pulmonary emboli.  Continue Pradaxa 12.  Atrial fibrillation.  Cardiac rate controlled  Monitor heart rate with increased physical activity 13.  History of tobacco abuse.  Counseling 14.  GERD: Protonix drop with meds as  below 15.   Thrombocytopenia  Platelets 130 on 5/27, CBC ordered  Cathlyn Parsons, [name] 02/21/2021  I have personally performed a face to face diagnostic evaluation, including, but not limited to relevant history and physical exam findings, of this patient and developed relevant assessment and plan.  Additionally, I have reviewed and concur with the physician assistant's documentation above.  Delice Lesch, MD, ABPMR  The patient's status has not changed. Any changes from the pre-admission screening or documentation from the acute chart are noted above.   Delice Lesch, MD, ABPMR

## 2021-02-21 NOTE — Discharge Summary (Signed)
Physician Discharge Summary  JERE BOSTROM NKN:[id number] DOB: [birth year] DOA: 02/15/2021  PCP: [name], MD  Admit date: 02/15/2021 Discharge date: 02/21/2021  Time spent: 45 minutes  Recommendations for Outpatient Follow-up:  Patient will be discharged to: Inpatient rehab, continue physical, occupational, speech therapies.  Patient will need to follow up with primary care provider within one week of discharge.  Follow-up with neurology in 4 to 6 weeks.  Patient should continue medications as prescribed.  Patient should follow a heart healthy diet.    Discharge Diagnoses:  Acute CVA Atrial fibrillation, chronic Essential hypertension CAD/PAD History of rectal adenocarcinoma/squamous cell lung cancer Marijuana use Thrombocytopenia  Discharge Condition: Stable  Diet recommendation: Heart healthy  Filed Weights   02/15/21 0815  Weight: 71.2 kg    History of present illness:  on 02/15/21 by Dr. Lovell Sheehan L Simmonsis a 60 y.o.malewith medical history significant forsquamous cell carcinoma of the lung, rectal adenocarcinoma,neuropathy, cardiomyopathy with LVEF 30-35%, bilateral PE on Eliquis, atrial fibrillation, and CAD/PAD who presented to the ED this morning after acute onset of diffuse weakness at home. His last known well around 6:30 AM and symptoms began shortly after that. He tried to use his urine with his right hand but it fell out of his hands and when he tried to pick it up from the floor with his other hand and he dropped it again. His legs then became weak after he went to his couch. He tried to get up but he could not do so on account of the weakness. After few minutes of this he called his wife and he was noted to have a blank look on his face. His wife called EMS and he was brought to the ED for further evaluation.He states he has been compliant with his medications to include Eliquis daily. He denies any fevers, chills, chest pain, shortness of  breath, palpitations, abdominal pain, nausea, or vomiting. He denies headache or vision changes.  Hospital Course:  Acute CVA -Patient presented with diffuse weakness -Concern for embolic origin given A. fib.-CT head showed no acute intracranial infarct or hemorrhage -MRI brain showed multifocal acute ischemia within both hemispheres, left greater than right, multiple vascular territories, largest area of abnormality left posterior insula.   -MRA brain and neck: occlusion of the left vertebral artery.  Otherwise normal intracranial MRA -Previous echocardiogram on 01/17/2021 showed an EF of 30 to 35%, global hypokinesis with some regional variation, LV diastolic parameters indeterminate in the setting of A. Fib. -Hemoglobin A1c 6.8, LDL 99 -Continue statin, aspirin -Neurology consulted and appreciated-patient started on Pradaxa -PT and OT recommending CIR  Atrial fibrillation, chronic -Patient was on Eliquis prior to admission however given acute CVA, question compliance versus ineffectiveness -Continue Pradaxa -Continue metoprolol  Essential hypertension -Initially allowed for permissive hypertension -Continue metoprolol and PRN metoprolol for HR >120 -Cozaar and Imdur currently held- BP currently stable  CAD/PAD -Currently stable, no complaints of chest pain -Continue metoprolol, aspirin, statin  History of rectal adenocarcinoma/squamous cell lung cancer -Follow-up with oncology as an outpatient  Marijuana use -Noted on urine drug screen -Counseled  Thrombocytopenia -No bleeding at this point, platelets have increased as compared to 02/19/2021 -Repeat CBC in one week  Consultants Neurology Inpatient rehab  Procedures  None  Discharge Exam: Vitals:   02/21/21 0329 02/21/21 0750  BP: 115/70 118/73  Pulse: 94 88  Resp: 18 18  Temp: 97.8 F (36.6 C) 98.3 F (36.8 C)  SpO2: 94% 93%  General: Well developed, well nourished, NAD, appears stated  age  60: NCAT, mucous membranes moist.   Cardiovascular: S1 S2 auscultated, irregular  Respiratory: Clear to auscultation bilaterally, no wheezing  Abdomen: Soft, nontender, nondistended, + bowel sounds  Extremities: warm dry without cyanosis clubbing or edema  Neuro: AAOx3, right-sided weakness, otherwise nonfocal  Psych: pleasant, appropriate mood and affect  Discharge Instructions Discharge Instructions    Diet - low sodium heart healthy   Complete by: As directed    Discharge instructions   Complete by: As directed    Patient will be discharged to: Inpatient rehab, continue physical, occupational, speech therapies.  Patient will need to follow up with primary care provider within one week of discharge.  Follow-up with neurology in 4 to 6 weeks.  Patient should continue medications as prescribed.  Patient should follow a heart healthy diet.   Discharge wound care:   Complete by: As directed    Dry dressing to chronic left buttock wound.  Change daily   Increase activity slowly   Complete by: As directed      Allergies as of 02/21/2021      Reactions   Tramadol Other (See Comments)   Felt sluggish and ineffective    Darvocet [propoxyphene N-acetaminophen] Palpitations      Medication List    STOP taking these medications   apixaban 5 MG Tabs tablet Commonly known as: ELIQUIS   isosorbide mononitrate 30 MG 24 hr tablet Commonly known as: IMDUR   losartan 25 MG tablet Commonly known as: COZAAR   oxyCODONE-acetaminophen 10-325 MG tablet Commonly known as: Percocet     TAKE these medications   aspirin EC 81 MG tablet Take 81 mg by mouth daily.   dabigatran 150 MG Caps capsule Commonly known as: PRADAXA Take 1 capsule (150 mg total) by mouth every 12 (twelve) hours.   DURVALUMAB IV Inject into the vein every 14 (fourteen) days.   HYDROcodone-acetaminophen 10-325 MG tablet Commonly known as: NORCO Take 1 tablet by mouth every 4 (four) hours as  needed. What changed: reasons to take this   metoprolol succinate 50 MG 24 hr tablet Commonly known as: TOPROL-XL Take 1 tablet (50 mg total) by mouth daily. Take with or immediately following a meal. Start taking on: Feb 22, 2021 What changed:   medication strength  how much to take  additional instructions   Nitrostat 0.4 MG SL tablet Generic drug: nitroGLYCERIN PLACE 1 TABLET UNDER THE TONGUE AS NEEDED FOR CHEST PAIN UP TO 3 DOSES What changed: See the new instructions.   omeprazole 40 MG capsule Commonly known as: PRILOSEC Take 40 mg by mouth daily.   simvastatin 40 MG tablet Commonly known as: ZOCOR Take 1 tablet (40 mg total) by mouth daily at 6 PM.            Discharge Care Instructions  (From admission, onward)         Start     Ordered   02/21/21 0000  Discharge wound care:       Comments: Dry dressing to chronic left buttock wound.  Change daily   02/21/21 1041         Allergies  Allergen Reactions  . Tramadol Other (See Comments)    Felt sluggish and ineffective   . Darvocet [Propoxyphene N-Acetaminophen] Palpitations      The results of significant diagnostics from this hospitalization (including imaging, microbiology, ancillary and laboratory) are listed below for reference.    Significant Diagnostic Studies: CT  HEAD WO CONTRAST  Result Date: 02/17/2021 CLINICAL DATA:  Stroke, worsening right-sided deficits EXAM: CT HEAD WITHOUT CONTRAST TECHNIQUE: Contiguous axial images were obtained from the base of the skull through the vertex without intravenous contrast. COMPARISON:  Correlation made with recent MRI and CT. FINDINGS: Brain: Interval development of areas of hypoattenuation, for example along the posterior left insula corresponding to acute infarcts on MRI. There is no evidence of hemorrhagic transformation. Stable findings of chronic microvascular ischemic changes. Ventricles are stable in size. Vascular: No new findings. Skull: Calvarium  is unremarkable. Sinuses/Orbits: No acute finding. Other: None. IMPRESSION: Areas of acute infarction on recent MRI are now partially visible on CT. No evidence of hemorrhagic transformation. Electronically Signed   By: Macy Mis M.D.   On: 02/17/2021 17:35   MR ANGIO HEAD WO CONTRAST  Result Date: 02/15/2021 CLINICAL DATA:  Squamous cell carcinoma of the tongue. Acute neurologic deficit. EXAM: MRI HEAD WITHOUT CONTRAST MRA HEAD WITHOUT CONTRAST MRA OF THE NECK WITHOUT AND WITH CONTRAST TECHNIQUE: Multiplanar, multi-echo pulse sequences of the brain and surrounding structures were acquired without intravenous contrast. Angiographic images of the Circle of Willis were acquired using MRA technique without intravenous contrast. Angiographic images of the neck were acquired using MRA technique without and with intravenous contrast. Carotid stenosis measurements (when applicable) are obtained utilizing NASCET criteria, using the distal internal carotid diameter as the denominator. CONTRAST:  80mL GADAVIST GADOBUTROL 1 MMOL/ML IV SOLN COMPARISON:  No pertinent prior exam. FINDINGS: MR HEAD FINDINGS Brain: There is multifocal abnormal diffusion restriction within both hemispheres, left-greater-than-right. The largest area of abnormality is at the posterior left insula. There are other lesions in the right frontal lobe, both parietal lobes and both occipital lobes. Focus of chronic microhemorrhage in the left frontal operculum. There is multifocal hyperintense T2-weighted signal within the white matter. Generalized volume loss without a clear lobar predilection. The midline structures are normal. Vascular: Major flow voids are preserved. Skull and upper cervical spine: Normal calvarium and skull base. Visualized upper cervical spine and soft tissues are normal. Sinuses/Orbits:No paranasal sinus fluid levels or advanced mucosal thickening. No mastoid or middle ear effusion. Normal orbits. MRA HEAD FINDINGS POSTERIOR  CIRCULATION: --Vertebral arteries: Normal --Inferior cerebellar arteries: Normal. --Basilar artery: Normal. --Superior cerebellar arteries: Normal. --Posterior cerebral arteries: Normal. ANTERIOR CIRCULATION: --Intracranial internal carotid arteries: Normal. --Anterior cerebral arteries (ACA): Normal. --Middle cerebral arteries (MCA): Normal. ANATOMIC VARIANTS: None MRA NECK FINDINGS Aortic arch: Normal 3 vessel branching pattern. Right carotid system: No stenosis or occlusion Left carotid system: No stenosis or occlusion Vertebral arteries: The left vertebral artery is occluded. The distal portion is reconstituted, likely due to flow across the vertebrobasilar confluence. Other: None. IMPRESSION: 1. Multifocal acute ischemia within both hemispheres, left-greater-than-right, and multiple vascular territories. The largest area of abnormality is at the posterior left insula. The pattern is most suggestive of an embolic process, particularly in a patient with atrial fibrillation. 2. Occlusion of the left vertebral artery with distal reconstitution due to flow across the vertebrobasilar confluence. 3. Normal intracranial MRA Electronically Signed   By: Ulyses Jarred M.D.   On: 02/15/2021 23:44   MR ANGIO NECK W WO CONTRAST  Result Date: 02/15/2021 CLINICAL DATA:  Squamous cell carcinoma of the tongue. Acute neurologic deficit. EXAM: MRI HEAD WITHOUT CONTRAST MRA HEAD WITHOUT CONTRAST MRA OF THE NECK WITHOUT AND WITH CONTRAST TECHNIQUE: Multiplanar, multi-echo pulse sequences of the brain and surrounding structures were acquired without intravenous contrast. Angiographic images of the Circle of Willis  were acquired using MRA technique without intravenous contrast. Angiographic images of the neck were acquired using MRA technique without and with intravenous contrast. Carotid stenosis measurements (when applicable) are obtained utilizing NASCET criteria, using the distal internal carotid diameter as the denominator.  CONTRAST:  39mL GADAVIST GADOBUTROL 1 MMOL/ML IV SOLN COMPARISON:  No pertinent prior exam. FINDINGS: MR HEAD FINDINGS Brain: There is multifocal abnormal diffusion restriction within both hemispheres, left-greater-than-right. The largest area of abnormality is at the posterior left insula. There are other lesions in the right frontal lobe, both parietal lobes and both occipital lobes. Focus of chronic microhemorrhage in the left frontal operculum. There is multifocal hyperintense T2-weighted signal within the white matter. Generalized volume loss without a clear lobar predilection. The midline structures are normal. Vascular: Major flow voids are preserved. Skull and upper cervical spine: Normal calvarium and skull base. Visualized upper cervical spine and soft tissues are normal. Sinuses/Orbits:No paranasal sinus fluid levels or advanced mucosal thickening. No mastoid or middle ear effusion. Normal orbits. MRA HEAD FINDINGS POSTERIOR CIRCULATION: --Vertebral arteries: Normal --Inferior cerebellar arteries: Normal. --Basilar artery: Normal. --Superior cerebellar arteries: Normal. --Posterior cerebral arteries: Normal. ANTERIOR CIRCULATION: --Intracranial internal carotid arteries: Normal. --Anterior cerebral arteries (ACA): Normal. --Middle cerebral arteries (MCA): Normal. ANATOMIC VARIANTS: None MRA NECK FINDINGS Aortic arch: Normal 3 vessel branching pattern. Right carotid system: No stenosis or occlusion Left carotid system: No stenosis or occlusion Vertebral arteries: The left vertebral artery is occluded. The distal portion is reconstituted, likely due to flow across the vertebrobasilar confluence. Other: None. IMPRESSION: 1. Multifocal acute ischemia within both hemispheres, left-greater-than-right, and multiple vascular territories. The largest area of abnormality is at the posterior left insula. The pattern is most suggestive of an embolic process, particularly in a patient with atrial fibrillation. 2.  Occlusion of the left vertebral artery with distal reconstitution due to flow across the vertebrobasilar confluence. 3. Normal intracranial MRA Electronically Signed   By: Ulyses Jarred M.D.   On: 02/15/2021 23:44   MR BRAIN WO CONTRAST  Result Date: 02/15/2021 CLINICAL DATA:  Squamous cell carcinoma of the tongue. Acute neurologic deficit. EXAM: MRI HEAD WITHOUT CONTRAST MRA HEAD WITHOUT CONTRAST MRA OF THE NECK WITHOUT AND WITH CONTRAST TECHNIQUE: Multiplanar, multi-echo pulse sequences of the brain and surrounding structures were acquired without intravenous contrast. Angiographic images of the Circle of Willis were acquired using MRA technique without intravenous contrast. Angiographic images of the neck were acquired using MRA technique without and with intravenous contrast. Carotid stenosis measurements (when applicable) are obtained utilizing NASCET criteria, using the distal internal carotid diameter as the denominator. CONTRAST:  72mL GADAVIST GADOBUTROL 1 MMOL/ML IV SOLN COMPARISON:  No pertinent prior exam. FINDINGS: MR HEAD FINDINGS Brain: There is multifocal abnormal diffusion restriction within both hemispheres, left-greater-than-right. The largest area of abnormality is at the posterior left insula. There are other lesions in the right frontal lobe, both parietal lobes and both occipital lobes. Focus of chronic microhemorrhage in the left frontal operculum. There is multifocal hyperintense T2-weighted signal within the white matter. Generalized volume loss without a clear lobar predilection. The midline structures are normal. Vascular: Major flow voids are preserved. Skull and upper cervical spine: Normal calvarium and skull base. Visualized upper cervical spine and soft tissues are normal. Sinuses/Orbits:No paranasal sinus fluid levels or advanced mucosal thickening. No mastoid or middle ear effusion. Normal orbits. MRA HEAD FINDINGS POSTERIOR CIRCULATION: --Vertebral arteries: Normal --Inferior  cerebellar arteries: Normal. --Basilar artery: Normal. --Superior cerebellar arteries: Normal. --Posterior cerebral arteries: Normal. ANTERIOR  CIRCULATION: --Intracranial internal carotid arteries: Normal. --Anterior cerebral arteries (ACA): Normal. --Middle cerebral arteries (MCA): Normal. ANATOMIC VARIANTS: None MRA NECK FINDINGS Aortic arch: Normal 3 vessel branching pattern. Right carotid system: No stenosis or occlusion Left carotid system: No stenosis or occlusion Vertebral arteries: The left vertebral artery is occluded. The distal portion is reconstituted, likely due to flow across the vertebrobasilar confluence. Other: None. IMPRESSION: 1. Multifocal acute ischemia within both hemispheres, left-greater-than-right, and multiple vascular territories. The largest area of abnormality is at the posterior left insula. The pattern is most suggestive of an embolic process, particularly in a patient with atrial fibrillation. 2. Occlusion of the left vertebral artery with distal reconstitution due to flow across the vertebrobasilar confluence. 3. Normal intracranial MRA Electronically Signed   By: Ulyses Jarred M.D.   On: 02/15/2021 23:44   MR CERVICAL SPINE WO CONTRAST  Result Date: 02/17/2021 CLINICAL DATA:  Progressive myelopathy EXAM: MRI CERVICAL SPINE WITHOUT CONTRAST TECHNIQUE: Multiplanar, multisequence MR imaging of the cervical spine was performed. No intravenous contrast was administered. COMPARISON:  None. FINDINGS: Alignment: Physiologic. Vertebrae: No fracture, evidence of discitis, or bone lesion. Cord: Normal signal and morphology. Posterior Fossa, vertebral arteries, paraspinal tissues: Abnormal left vertebral artery flow void. Disc levels: C1-C2: Normal. C2-C3: Mild left uncovertebral hypertrophy. Mild left foraminal stenosis. No spinal canal stenosis. C3-C4: Small disc bulge with bilateral uncovertebral hypertrophy. Mild left foraminal stenosis. C4-C5: Small disc bulge without stenosis. C5-C6:  Small disc bulge with uncovertebral hypertrophy. Mild spinal canal and bilateral neural foraminal stenosis. C6-C7: Small disc bulge with left-greater-than-right uncovertebral hypertrophy. Mild left foraminal stenosis. C7-T1: Normal disc space and facets. No spinal canal or neuroforaminal stenosis. IMPRESSION: 1. Abnormal left vertebral artery flow void consistent with occlusion or high-grade stenosis. 2. Mild spinal canal and bilateral neural foraminal stenosis at C5-C6. 3. Mild left C2-3, left C3-4 and left C6-7 neural foraminal stenosis. Electronically Signed   By: Ulyses Jarred M.D.   On: 02/17/2021 01:17   NM PET Image Restag (PS) Skull Base To Thigh  Result Date: 02/04/2021 CLINICAL DATA:  Subsequent treatment strategy for colorectal cancer in a 61 year old male, assess treatment response. EXAM: NUCLEAR MEDICINE PET SKULL BASE TO THIGH TECHNIQUE: 8.69 mCi F-18 FDG was injected intravenously. Full-ring PET imaging was performed from the skull base to thigh after the radiotracer. CT data was obtained and used for attenuation correction and anatomic localization. Fasting blood glucose: 103 mg/dl COMPARISON:  April of 2022 and prior PET imaging from August of 2021. FINDINGS: Mediastinal blood pool activity: SUV max 1.84 Liver activity: SUV max NA NECK: No hypermetabolic lymph nodes in the neck. Incidental CT findings: none CHEST: At the site of newly discovered nodularity amidst post treatment changes is evidence of increased metabolic activity. Maximum SUV in this location of 13.1. Area measuring approximately 3.1 x 2.3 cm. Adjacent pleural thickening similar to recent chest CT with small effusion dependently in the chest. (Image 102/3) subcentimeter lymph node in the anterior mediastinum, 8 mm short axis with a maximum SUV of 3.7 Mildly enlarged subcarinal nodal tissue (image 131/3) maximum SUV of 4.3 Mild increased metabolic activity about the RIGHT hilum in a subcentimeter lymph node on image 131 of series  3 with a maximum SUV of 4. No additional foci of increased metabolic activity in the chest. Incidental CT findings: Marked pulmonary emphysema. Signs of ground-glass and septal thickening extending from the area of the RIGHT lower lobe in the setting of prior post treatment changes. Small nodule at the LEFT  lung base not well evaluated on the current study (image 160/3 not displaying marked hypermetabolic activity and unchanged since 2019. Unchanged juxtapleural nodule in the RIGHT mid chest and small RIGHT upper lobe nodules. Calcified coronary artery disease, three-vessel disease. No substantial pericardial effusion. Calcified aortic atherosclerosis. Normal caliber of the central pulmonary vessels. LEFT-sided Port-A-Cath terminates at the distal aspect of the superior vena cava. ABDOMEN/PELVIS: Multifocal hepatic metastatic disease with marked hypermetabolic activity (image 841/3) maximum SUV of 12.8 in an area of the LEFT hepatic lobe measuring approximately 4.1 x 3.2 cm. This may have enlarged since the prior study but early phase of enhancement on the prior study limits assessment. Numerous additional foci of hepatic metastatic disease throughout the liver including the RIGHT hepatic lobe, for instance in hepatic subsegment VII/VIII on image 183 of series 3 is an area displaying a maximum SUV of 9 and measuring approximately 2 cm, previously this area measured approximately 1.8 cm, again comparison with respect to size difficult. At least 12 additional lesions in the liver displaying similar metabolic activity. Celiac lymph node on image 203 of series 3 measuring 8 mm short axis with a maximum SUV of 9.3. Intense metabolic activity in the presacral region tracking towards the LEFT gluteal fold similar to the prior study, gas in this tract and C time and place. This is adjacent to a low rectal anastomosis. Post RIGHT colectomy as before. No adenopathy in the pelvis. Incidental CT findings: Postoperative changes  in the abdomen as described. No acute pancreatic process. Spleen is normal size. Mild thickening of the adrenal glands is unchanged without hypermetabolic features. The smooth contour the bilateral kidneys. No hydronephrosis. Mild perinephric stranding is similar to prior studies. Signs of RIGHT colectomy and diverting ostomy in the RIGHT hemiabdomen. Post treatment changes in the pelvis adjacent to anastomotic site with fistulous tract extending into the LEFT gluteal fold. See time and place. No discrete fluid collection aside from gas within the presacral region and along the presumed fistulous tract in the area of the sphincter complex crossing pelvic floor into the LEFT gluteal fold. Aortic atherosclerosis without aneurysmal dilation. SKELETON: No focal hypermetabolic activity to suggest skeletal metastasis. Incidental CT findings: Irregularity of the anterior sacrum in the setting of fistula in this location may reflect chronic osteomyelitis. Signs of avascular necrosis of LEFT femoral head. IMPRESSION: Signs of recurrent disease at the site of treated lung cancer. Mildly hypermetabolic lymph nodes in the chest are suspicious in light of other findings on the current study for nodal disease. Signs of hepatic metastatic disease either from lung primary or colonic primary. Celiac nodal disease also noted in the upper abdomen. Signs of fistulous tract in the LEFT gluteal region extending from the upper margin of the sphincter complex remaining associated with increased metabolic activity and with small amounts of gas along the tract. Gas may have increased slightly since more remote imaging. There is no drainable collection. Correlate with any worsening pelvic symptoms. Irregularity of the anterior sacrum may reflect chronic osteomyelitis. Marked changes of avascular necrosis of LEFT femoral head. Aortic atherosclerosis. Pulmonary emphysema. Aortic Atherosclerosis (ICD10-I70.0) and Emphysema (ICD10-J43.9).  Electronically Signed   By: Zetta Bills M.D.   On: 02/04/2021 14:44   US BIOPSY (LIVER)  Result Date: 02/06/2021 INDICATION: 60 year old gentleman with history of colorectal malignancy presents today measure radiology for biopsy of liver lesion. EXAM: Ultrasound-guided biopsy of liver mass MEDICATIONS: None. ANESTHESIA/SEDATION: Moderate (conscious) sedation was employed during this procedure. A total of Versed 1 mg and  Fentanyl 75 mcg was administered intravenously. Moderate Sedation Time: 10 minutes. The patient's level of consciousness and vital signs were monitored continuously by radiology nursing throughout the procedure under my direct supervision. COMPLICATIONS: None immediate. PROCEDURE: Informed written consent was obtained from the patient after a thorough discussion of the procedural risks, benefits and alternatives. All questions were addressed. Maximal Sterile Barrier Technique was utilized including caps, mask, sterile gowns, sterile gloves, sterile drape, hand hygiene and skin antiseptic. A timeout was performed prior to the initiation of the procedure. Patient position supine on the ultrasound table. Right upper quadrant skin prepped and draped in usual sterile fashion. Following local lidocaine administration, 18 gauge by 0 pens needle was advanced into the right liver lesion, and three cores were obtained utilizing continuous ultrasound guidance. Samples were sent to pathology in formalin. Needle removed and hemostasis achieved with 5 minutes of manual compression. Post procedure ultrasound images showed no evidence of significant hemorrhage. IMPRESSION: Ultrasound-guided biopsy of right liver mass as above. Electronically Signed   By: Miachel Roux M.D.   On: 02/06/2021 15:14   CT HEAD CODE STROKE WO CONTRAST  Result Date: 02/15/2021 CLINICAL DATA:  Code stroke. 60 year old male with acute neurologic deficit. Colorectal cancer. EXAM: CT HEAD WITHOUT CONTRAST TECHNIQUE: Contiguous axial  images were obtained from the base of the skull through the vertex without intravenous contrast. COMPARISON:  Brain MRI 05/20/2020. FINDINGS: Brain: Broad-based area of course pachymeningeal calcification along the left superior hemisphere convexity is stable from the MRI last year although more conspicuous on CT. No acute intracranial hemorrhage identified. No acute intracranial mass effect. No ventriculomegaly. Widespread patchy white matter hypodensity, more pronounced in the right hemisphere. No cortically based acute infarct identified. No cortical encephalomalacia identified. Vascular: Calcified atherosclerosis at the skull base. No suspicious intracranial vascular hyperdensity. Skull: No acute or suspicious osseous lesion identified. Sinuses/Orbits: Visualized paranasal sinuses and mastoids are stable and well aerated. Other: Visualized orbits and scalp soft tissues are within normal limits. ASPECTS [facility] Stroke Program Early CT Score) Total score (0-10 with 10 being normal): 10 IMPRESSION: 1. No acute cortically based infarct or acute intracranial hemorrhage identified. ASPECTS 10. 2. Coarse chronic dural calcification along the left hemisphere, perhaps sequelae of a remote subdural hematoma or infection. Chronic white matter disease. Electronically Signed   By: Genevie Ann M.D.   On: 02/15/2021 09:07    Microbiology: Recent Results (from the past 240 hour(s))  Resp Panel by RT-PCR (Flu A&B, Covid) Nasopharyngeal Swab     Status: None   Collection Time: 02/15/21  8:41 AM   Specimen: Nasopharyngeal Swab; Nasopharyngeal(NP) swabs in vial transport medium  Result Value Ref Range Status   SARS Coronavirus 2 by RT PCR NEGATIVE NEGATIVE Final    Comment: (NOTE) SARS-CoV-2 target nucleic acids are NOT DETECTED.  The SARS-CoV-2 RNA is generally detectable in upper respiratory specimens during the acute phase of infection. The lowest concentration of SARS-CoV-2 viral copies this assay can detect is 138  copies/mL. A negative result does not preclude SARS-Cov-2 infection and should not be used as the sole basis for treatment or other patient management decisions. A negative result may occur with  improper specimen collection/handling, submission of specimen other than nasopharyngeal swab, presence of viral mutation(s) within the areas targeted by this assay, and inadequate number of viral copies(<138 copies/mL). A negative result must be combined with clinical observations, patient history, and epidemiological information. The expected result is Negative.  Fact Sheet for Patients:  EntrepreneurPulse.com.au  Fact Sheet  for Healthcare Providers:  IncredibleEmployment.be  This test is no t yet approved or cleared by the [place] and  has been authorized for detection and/or diagnosis of SARS-CoV-2 by FDA under an Emergency Use Authorization (EUA). This EUA will remain  in effect (meaning this test can be used) for the duration of the COVID-19 declaration under Section 564(b)(1) of the Act, 21 U.S.C.section 360bbb-3(b)(1), unless the authorization is terminated  or revoked sooner.       Influenza A by PCR NEGATIVE NEGATIVE Final   Influenza B by PCR NEGATIVE NEGATIVE Final    Comment: (NOTE) The Xpert Xpress SARS-CoV-2/FLU/RSV plus assay is intended as an aid in the diagnosis of influenza from Nasopharyngeal swab specimens and should not be used as a sole basis for treatment. Nasal washings and aspirates are unacceptable for Xpert Xpress SARS-CoV-2/FLU/RSV testing.  Fact Sheet for Patients: EntrepreneurPulse.com.au  Fact Sheet for Healthcare Providers: IncredibleEmployment.be  This test is not yet approved or cleared by the [place] FDA and has been authorized for detection and/or diagnosis of SARS-CoV-2 by FDA under an Emergency Use Authorization (EUA). This EUA will remain in effect (meaning  this test can be used) for the duration of the COVID-19 declaration under Section 564(b)(1) of the Act, 21 U.S.C. section 360bbb-3(b)(1), unless the authorization is terminated or revoked.  Performed at [facility], [address], [place], [place] [address]   MRSA PCR Screening     Status: None   Collection Time: 02/18/21 12:30 PM   Specimen: Nasal Mucosa; Nasopharyngeal  Result Value Ref Range Status   MRSA by PCR NEGATIVE NEGATIVE Final    Comment:        The GeneXpert MRSA Assay (FDA approved for NASAL specimens only), is one component of a comprehensive MRSA colonization surveillance program. It is not intended to diagnose MRSA infection nor to guide or monitor treatment for MRSA infections. Performed at [facility], [place] [address] [place], [place] [address]      Labs: Basic Metabolic Panel: Recent Labs  Lab 02/16/21 0445 02/17/21 0530 02/18/21 0139 02/19/21 0348 02/20/21 0551 02/21/21 0603  NA 137 136 136 137 136 137  K 4.0 4.2 4.1 4.4 4.2 4.7  CL 106 104 102 103 101 104  CO2 26 26 24 24 24 25   GLUCOSE 88 94 101* 100* 110* 104*  BUN 7 9 7 10 12 16   CREATININE 1.12 1.18 1.08 1.20 1.40* 1.50*  CALCIUM 8.6* 8.7* 9.0 9.2 9.5 9.3  MG 1.8  --   --   --   --   --    Liver Function Tests: Recent Labs  Lab 02/15/21 0833  AST 28  ALT 25  ALKPHOS 118  BILITOT 0.9  PROT 7.1  ALBUMIN 3.1*   No results for input(s): LIPASE, AMYLASE in the last 168 hours. Recent Labs  Lab 02/15/21 1023  AMMONIA 12   CBC: Recent Labs  Lab 02/15/21 0833 02/15/21 0912 02/17/21 0530 02/18/21 0139 02/19/21 0348 02/20/21 0551 02/21/21 0603  WBC 7.3   < > 5.8 6.8 6.7 7.2 7.8  NEUTROABS 5.8  --   --   --   --   --   --   HGB 15.1   < > 15.2 16.1 16.5 17.1* 16.6  HCT 45.0   < > 45.1 46.6 47.9 50.4 47.7  MCV 101.1*   < > 98.5 97.1 96.6 97.7 96.4  PLT 134*   < > 125* 128* 110* 123* 130*   < > =  values in this interval not displayed.   Cardiac Enzymes: No results  for input(s): CKTOTAL, CKMB, CKMBINDEX, TROPONINI in the last 168 hours. BNP: BNP (last 3 results) Recent Labs    01/16/21 0239 02/15/21 1023  BNP 364.0* 535.0*    ProBNP (last 3 results) No results for input(s): PROBNP in the last 8760 hours.  CBG: No results for input(s): GLUCAP in the last 168 hours.     Signed:  Cristal Ford  [facility] 02/21/2021, 10:42 AM

## 2021-02-21 NOTE — Progress Notes (Signed)
Patient admitted to rehab hall this afternoon. Colostomy in place, leaking. Sacral dressing replaced. Patient comfortable with call light and personal belongings within reach, no complaints of pain at this time. Angie Fava

## 2021-02-21 NOTE — Progress Notes (Signed)
Jamse Arn, MD  Physician  Physical Medicine and Rehabilitation  Consult Note      Signed  Date of Service:  02/19/2021  5:35 AM      Related encounter: ED to Hosp-Admission (Discharged) from 02/15/2021 in [facility]       Signed      Expand All Collapse All     Show:Clear all [x] Manual[x] Template[] Copied  Added by: [x] Angiulli, Lavon Paganini, [name][x] Jamse Arn, MD   [] Hover for details           Physical Medicine and Rehabilitation Consult Reason for Consult: Dysarthria with left gaze preference Referring Physician: Dr. Avon Gully     HPI: CODI KERTZ is a 60 y.o. right-handed male with history of squamous cell carcinoma of the lung, rectal adenocarcinoma, neuropathy, cardiomyopathy with ejection fraction of 30-35%, bilateral pulmonary emboli on Eliquis, atrial fibrillation, CAD/PAD, hypertension, tobacco abuse, chronic back pain followed at [facility].  History taken from chart review due to cognition and HOH.  Patient lives with spouse independent prior to admission.  1 level home with 3-4 steps to entry.  He presented on 02/23/2021 with weakness, dysarthria, and left gaze preference. CT/MRI as well as MRA showed multifocal acute ischemia within bilateral, left greater than right multiple vascular territories.  The largest area of abnormality at the posterior left insula.  The pattern most suggestive of embolic process.  Occlusion of the left vertebral artery with distal reconstitution due to flow across the vertebrobasilar confluence.  Normal intracranial MRA MRI cervical spine showed mild spinal canal and bilateral neuroforaminal stenosis at C5-6.  Mild left C2-3, left C3-4 and left C6-7 neural foraminal stenosis.  Admission chemistries unremarkable aside from glucose of 117, alcohol negative, urine drug screen positive marijuana.  Recent echocardiogram with ejection fraction of 30-35%, global hypokinesis.  Neurology follow-up currently  maintained on low-dose aspirin as well as Pradaxa for CVA prophylaxis.  Tolerating a regular consistency diet.  CIR was consulted due to right hemiparesis, dysarthria, cognitive dysfunction.   Review of Systems  Constitutional: Positive for malaise/fatigue. Negative for chills and fever.  HENT: Negative for hearing loss.   Eyes: Negative for blurred vision and double vision.  Respiratory: Negative for cough.        Occasional shortness of breath with heavy exertion  Cardiovascular: Positive for palpitations. Negative for chest pain and leg swelling.  Gastrointestinal: Positive for constipation. Negative for heartburn and nausea.       GERD  Genitourinary: Positive for urgency. Negative for dysuria, flank pain and hematuria.  Musculoskeletal: Positive for joint pain and myalgias.  Skin: Negative for rash.  Neurological: Positive for speech change, focal weakness and weakness.  All other systems reviewed and are negative.   Past Medical History:  Diagnosis Date  . Chronic lower back pain      a. Followed by pain management at [facility].  . Colon cancer [facility])      rectal cancer  . Coronary artery disease      a. 03/2013: abnl nuc -> LHC s/p DES to LCx, residual moderate disease in LAD (med rx unless refractory angina). b. Not on BB due to bradycardia.  Marland Kitchen DVT (deep venous thrombosis) ([place]) ~ 2013  . Dysrhythmia      AFib  . GERD (gastroesophageal reflux disease)    . H/O necrotising fasciitis    . History of blood transfusion      "once; after throwing up alot of blood" (04/17/2013)  . Hypertension    .  LV dysfunction      a. EF 45% in 03/2013.  Marland Kitchen PAD (peripheral artery disease) ([place])      a. Occlusion of the right internal iliac artery, with significant atherosclerosis in the left internal iliac which was not amenable to reconstruction per notes from [place] in place 05/30/2020  . Pulmonary nodules 09/30/2014  . Rectal cancer ([place])    . Tobacco abuse      Past  Surgical History:  Procedure Laterality Date  . ABDOMINAL SURGERY   1990's    'for stomach ulcers" (04/17/2013)  . COLECTOMY   2012    "for rectal cancer" (04/17/2013)  . COLONOSCOPY   2013    Dr. Cheryll Cockayne: colorectal anastomosis with ulcer and inflammation, benign biopsy  . COLONOSCOPY WITH PROPOFOL N/A 09/07/2016    Procedure: COLONOSCOPY WITH PROPOFOL;  Surgeon: Daneil Dolin, MD;  [facility]: [facility];  Service: Endoscopy;  Laterality: N/A;  10:00 am Colonoscopy via rectum and ostomy  . COLOSTOMY TAKEDOWN   2013  . CORONARY ANGIOPLASTY WITH STENT PLACEMENT   04/17/2013    "?1" (04/17/2013)  . FEMORAL-POPLITEAL BYPASS GRAFT Left 07/02/2015    Procedure: BYPASS GRAFT LEFT COMMON FEMORAL ARTERY TO LEFT ABOVE KNEE POPLITEAL ARTERY - USING LEFT GREATER SAPPHENOUS VEIN;  Surgeon: Elam Dutch, MD;  Location: [place];  Service: Vascular;  Laterality: Left;  . INCISION AND DRAINAGE ABSCESS Left 06/05/2016    Procedure: INCISION AND DRAINAGE ABSCESS;  Surgeon: Aviva Signs, MD;  Location: [facility] ORS;  Service: General;  Laterality: Left;  . INCISION AND DRAINAGE PERIRECTAL ABSCESS Left 06/07/2016    Procedure: IRRIGATION AND DEBRIDEMENT LEFT BUTTOCK ABSCESS;  Surgeon: Greer Pickerel, MD;  Location: [place];  Service: General;  Laterality: Left;  . INGUINAL HERNIA REPAIR Bilateral 1990's  . LAPAROSCOPIC PARTIAL COLECTOMY N/A 06/11/2016    Procedure: LAPAROSCOPIC  OPEN COLOSTOMY;  Surgeon: [name], MD;  Location: [place];  Service: General;  Laterality: N/A;  . LEFT HEART CATHETERIZATION WITH CORONARY ANGIOGRAM N/A 04/17/2013    Procedure: LEFT HEART CATHETERIZATION WITH CORONARY ANGIOGRAM;  Surgeon: Peter M Martinique, MD;  Location: [facility] CATH LAB;  Service: Cardiovascular;  Laterality: N/A;  . PERCUTANEOUS STENT INTERVENTION   04/17/2013    Procedure: PERCUTANEOUS STENT INTERVENTION;  Surgeon: Peter M Martinique, MD;  Location: [facility] CATH LAB;  Service: Cardiovascular;;  . PERIPHERAL VASCULAR  CATHETERIZATION N/A 06/14/2015    Procedure: Abdominal Aortogram;  Surgeon: Elam Dutch, MD;  Location: [place] CV LAB;  Service: Cardiovascular;  Laterality: N/A;  . PERIPHERAL VASCULAR CATHETERIZATION Bilateral 06/14/2015    Procedure: Lower Extremity Angiography;  Surgeon: Elam Dutch, MD;  Location: [place] CV LAB;  Service: Cardiovascular;  Laterality: Bilateral;  . PORTACATH PLACEMENT Left 06/05/2020    Procedure: INSERTION PORT-A-CATH;  Surgeon: Aviva Signs, MD;  Location: [facility];  Service: General;  Laterality: Left;  Marland Kitchen VEIN HARVEST Left 07/02/2015    Procedure: VEIN HARVEST - LEFT GREATER SAPPHENOUS VEIN;  Surgeon: Elam Dutch, MD;  Location[facility];  Service: Vascular;  Laterality: Left;         Family History  Problem Relation Age of Onset  . Cancer Mother    . Hypertension Mother    . Bleeding Disorder Brother      Social History:  reports that he has been smoking cigarettes. He started smoking about 46 years ago. He has a 10.00 pack-year smoking history. He has never used smokeless tobacco. He  reports current alcohol use of about 3.0 standard drinks of alcohol per week. He reports current drug use. Drug: Marijuana. Allergies:       Allergies  Allergen Reactions  . Tramadol Other (See Comments)      Felt sluggish and ineffective   . Darvocet [Propoxyphene N-Acetaminophen] Palpitations          Medications Prior to Admission  Medication Sig Dispense Refill  . apixaban (ELIQUIS) 5 MG TABS tablet Take 1 tablet (5 mg total) by mouth 2 (two) times daily. 60 tablet 1  . aspirin EC 81 MG tablet Take 81 mg by mouth daily.      [name] Oris IV Inject into the vein every 14 (fourteen) days.      Marland Kitchen HYDROcodone-acetaminophen (NORCO) 10-325 MG tablet Take 1 tablet by mouth every 4 (four) hours as needed. (Patient taking differently: Take 1 tablet by mouth every 4 (four) hours as needed for severe pain.) 180 tablet 0  . isosorbide mononitrate (IMDUR) 30 MG 24 hr  tablet TAKE 1/2 TABLET BY MOUTH ONCE DAILY. (Patient taking differently: Take 15 mg by mouth daily.) 15 tablet 3  . losartan (COZAAR) 25 MG tablet Take 0.5 tablets (12.5 mg total) by mouth daily. 30 tablet 1  . metoprolol succinate (TOPROL-XL) 25 MG 24 hr tablet Take 1 tablet (25 mg total) by mouth daily. 30 tablet 1  . NITROSTAT 0.4 MG SL tablet PLACE 1 TABLET UNDER THE TONGUE AS NEEDED FOR CHEST PAIN UP TO 3 DOSES (Patient taking differently: Place 0.4 mg under the tongue every 5 (five) minutes as needed for chest pain.) 25 tablet 3  . omeprazole (PRILOSEC) 40 MG capsule Take 40 mg by mouth daily.      Marland Kitchen oxyCODONE-acetaminophen (PERCOCET) 10-325 MG tablet Take 1 tablet by mouth every 6 (six) hours as needed for pain. 60 tablet 0      Home: Home Living Family/patient expects to be discharged to:: Private residence Living Arrangements: Spouse/significant other Available Help at Discharge: Family,Available PRN/intermittently Type of Home: Mobile home Home Access: Stairs to enter [employer] of Steps: 3-4 Entrance Stairs-Rails: Right,Left Home Layout: One level Bathroom Shower/Tub: Engineer, petroleum: Standard Bathroom Accessibility: Yes Home Equipment: Cane - single point,Hand held shower head  Lives With: Spouse  Functional History: Prior Function Level of Independence: Independent Comments: I with ADLs/IADLs without AD; drives; shared responsibility with wife for IADLs Functional Status:  Mobility: Bed Mobility Overal bed mobility: Needs Assistance Bed Mobility: Supine to Sit Supine to sit: Mod assist,HOB elevated Sit to supine: Max assist General bed mobility comments: mod assist for progression of RLE to EOB, sequential scooting to EOB. Bed exit towards pt's L, verbal cuing for sequencing task and safe placement of RUE. Transfers Overall transfer level: Needs assistance Equipment used: 2 person hand held assist Transfers: Sit to/from Stand Sit  to Stand: +2 physical assistance,Mod assist General transfer comment: Mod +2 for initial power up, rise, steadying, RLE close guard with blacking during dynamic activity. Ambulation/Gait Ambulation/Gait assistance: +2 physical assistance,Max assist Gait Distance (Feet): 2 Feet Assistive device: 2 person hand held assist Gait Pattern/deviations: Step-to pattern,Decreased weight shift to right,Decreased stance time - right,Trunk flexed General Gait Details: Pt able to take 2 steps towards [facility] with max +2 assist for weight shifting L/R, RLE blocking during stance phase, steadying.   ADL: ADL Overall ADL's : Needs assistance/impaired Grooming: Maximal assistance Grooming Details (indicate cue type and reason): sitting at EOB with (A) for static balance Upper Body  Bathing: Maximal assistance Toilet Transfer: +2 for physical assistance,Moderate assistance Toilet Transfer Details (indicate cue type and reason): simulated EOB static standing General ADL Comments: Pt motivated to participate.   Cognition: Cognition Overall Cognitive Status: Impaired/Different from baseline Arousal/Alertness: Awake/alert Orientation Level: Oriented X4 Attention: Focused,Sustained Focused Attention: Impaired Focused Attention Impairment: Verbal complex,Functional complex Sustained Attention: Impaired Sustained Attention Impairment: Verbal complex,Functional complex Memory: Impaired Memory Impairment: Decreased recall of new information,Decreased short term memory Awareness: Impaired Problem Solving: Impaired Problem Solving Impairment: Verbal complex,Functional complex Executive Function: Organizing,Self Monitoring Organizing: Impaired Self Monitoring: Impaired Safety/Judgment: Impaired Cognition Arousal/Alertness: Awake/alert Behavior During Therapy: WFL for tasks assessed/performed Overall Cognitive Status: Impaired/Different from baseline Area of Impairment: Attention Current Attention Level:  Sustained General Comments: Pt answering questions appropriately, requiring increased processing time to do so. Intermittent slurred speech and difficulty with word finding   Blood pressure (!) 117/93, pulse 66, temperature 97.7 F (36.5 C), temperature source Oral, resp. rate 18, height 5\' 9"  (1.753 m), weight 71.2 kg, SpO2 93 %. Physical Exam Constitutional:      General: He is not in acute distress.    Appearance: He is normal weight. He is not ill-appearing.  HENT:     Head: Normocephalic and atraumatic.     Right Ear: External ear normal.     Left Ear: External ear normal.     Nose: Nose normal.  Eyes:     General:        Right eye: No discharge.        Left eye: No discharge.     Extraocular Movements: Extraocular movements intact.  Cardiovascular:     Comments: Irregularly irregular Pulmonary:     Effort: Pulmonary effort is normal. No respiratory distress.     Breath sounds: No stridor.  Abdominal:     General: Abdomen is flat. Bowel sounds are normal. There is no distension.  Musculoskeletal:     Cervical back: Normal range of motion and neck supple.     Comments: No edema or tenderness in extremities  Skin:    General: Skin is warm and dry.  Neurological:     Mental Status: He is alert.     Comments: NAD Dysarthria HOH Motor: LUE/LE: 5/5 proximal distal RUE/RLE: 0/5 proximal distal   Psychiatric:        Speech: Speech is delayed and slurred.        Behavior: Behavior is slowed. Behavior is not agitated.        Lab Results Last 24 Hours       Results for orders placed or performed during the hospital encounter of 02/15/21 (from the past 24 hour(s))  MRSA PCR Screening     Status: None    Collection Time: 02/18/21 12:30 PM    Specimen: Nasal Mucosa; Nasopharyngeal  Result Value Ref Range    MRSA by PCR NEGATIVE NEGATIVE  CBC     Status: Abnormal    Collection Time: 02/19/21  3:48 AM  Result Value Ref Range    WBC 6.7 4.0 - 10.5 K/uL    RBC 4.96 4.22 -  5.81 MIL/uL    Hemoglobin 16.5 13.0 - 17.0 g/dL    HCT 47.9 39.0 - 52.0 %    MCV 96.6 80.0 - 100.0 fL    MCH 33.3 26.0 - 34.0 pg    MCHC 34.4 30.0 - 36.0 g/dL    RDW 15.4 11.5 - 15.5 %    Platelets 110 (L) 150 - 400 K/uL  nRBC 0.0 0.0 - 0.2 %  Basic metabolic panel     Status: Abnormal    Collection Time: 02/19/21  3:48 AM  Result Value Ref Range    Sodium 137 135 - 145 mmol/L    Potassium 4.4 3.5 - 5.1 mmol/L    Chloride 103 98 - 111 mmol/L    CO2 24 22 - 32 mmol/L    Glucose, Bld 100 (H) 70 - 99 mg/dL    BUN 10 6 - 20 mg/dL    Creatinine, Ser 1.20 0.61 - 1.24 mg/dL    Calcium 9.2 8.9 - 10.3 mg/dL    GFR, Estimated >60 >60 mL/min    Anion gap 10 5 - 15       Imaging Results (Last 48 hours)  CT HEAD WO CONTRAST   Result Date: 02/17/2021 CLINICAL DATA:  Stroke, worsening right-sided deficits EXAM: CT HEAD WITHOUT CONTRAST TECHNIQUE: Contiguous axial images were obtained from the base of the skull through the vertex without intravenous contrast. COMPARISON:  Correlation made with recent MRI and CT. FINDINGS: Brain: Interval development of areas of hypoattenuation, for example along the posterior left insula corresponding to acute infarcts on MRI. There is no evidence of hemorrhagic transformation. Stable findings of chronic microvascular ischemic changes. Ventricles are stable in size. Vascular: No new findings. Skull: Calvarium is unremarkable. Sinuses/Orbits: No acute finding. Other: None. IMPRESSION: Areas of acute infarction on recent MRI are now partially visible on CT. No evidence of hemorrhagic transformation. Electronically Signed   By: Macy Mis M.D.   On: 02/17/2021 17:35     Assessment/Plan: Diagnosis: Multifocal acute ischemia within bilateral, left greater than right multiple vascular territories due to left vertebral artery occlusion Stroke: Continue secondary stroke prophylaxis and Risk Factor Modification listed below:   Antiplatelet therapy:   Blood Pressure  Management:  Continue current medication with prn's with permisive HTN per primary team Statin Agent:   Tobacco abuse:   Right sided hemiparesis: fit for orthosis to prevent contractures (resting hand splint for day, wrist cock up splint at night, PRAFO, etc) PT/OT for mobility, ADL training  Motor recovery: Fluoxetine Labs independently reviewed.  Records reviewed and summated above.   1. Does the need for close, 24 hr/day medical supervision in concert with the patient's rehab needs make it unreasonable for this patient to be served in a less intensive setting? Yes 2. Co-Morbidities requiring supervision/potential complications: squamous cell carcinoma of the lung, rectal adenocarcinoma, neuropathy, cardiomyopathy with ejection fraction of 30-35% (Monitor in accordance with increased physical activity and avoid UE resistance excercises), bilateral pulmonary emboli (now on Pradaxa), atrial fibrillation, CAD/PAD, HTN (monitor and provide prns in accordance with increased physical exertion and pain), tobacco abuse (counsel), chronic back pain (Biofeedback training with therapies to help reduce reliance on opiate pain medications, monitor pain control during therapies, and sedation at rest and titrate to maximum efficacy to ensure participation and gains in therapies), marijuana abuse (counsel and appropriate) 3. Due to bladder management, bowel management, safety, skin/wound care, disease management, medication administration, pain management and patient education, does the patient require 24 hr/day rehab nursing? Yes 4. Does the patient require coordinated care of a physician, rehab nurse, therapy disciplines of PT/OT/SLP to address physical and functional deficits in the context of the above medical diagnosis(es)? Yes Addressing deficits in the following areas: balance, endurance, locomotion, strength, transferring, bathing, dressing, toileting, cognition, speech and psychosocial support 5. Can the  patient actively participate in an intensive therapy program of at least 3  hrs of therapy per day at least 5 days per week? Yes 6. The potential for patient to make measurable gains while on inpatient rehab is excellent 7. Anticipated functional outcomes upon discharge from inpatient rehab are min assist and mod assist  with PT, min assist and mod assist with OT, supervision and min assist with SLP. 8. Estimated rehab length of stay to reach the above functional goals is: 20-24 days 9. Anticipated discharge destination: TBD 10. Overall Rehab/Functional Prognosis: good   RECOMMENDATIONS: This patient's condition is appropriate for continued rehabilitative care in the following setting: CIR if adequate caregiver support available upon discharge. Patient has agreed to participate in recommended program. Potentially Note that insurance prior authorization may be required for reimbursement for recommended care.   Comment: Rehab Admissions Coordinator to follow up.   I have personally performed a face to face diagnostic evaluation, including, but not limited to relevant history and physical exam findings, of this patient and developed relevant assessment and plan.  Additionally, I have reviewed and concur with the physician assistant's documentation above.    Delice Lesch, MD, ABPMR Cathlyn Parsons, [name] 02/19/2021          Revision History                        Routing History                   Note Details  Author Jamse Arn, MD File Time 02/19/2021  3:04 PM  Author Type Physician Status Signed  Last Editor Jamse Arn, MD Service Physical Medicine and Rehabilitation

## 2021-02-21 NOTE — Progress Notes (Signed)
Jamse Arn, MD  Physician  Physical Medicine and Rehabilitation  PMR Pre-admission      Addendum  Date of Service:  02/20/2021  8:46 AM      Related encounter: ED to Hosp-Admission (Discharged) from 02/15/2021 in [place]           Show:Clear all [x] Manual[x] Template[x] Copied  Added by: [x] Julious Payer Vertis Kelch, RN[x] Jamse Arn, MD   [] Hover for details  PMR Admission Coordinator Pre-Admission Assessment   Patient: Juan Wheeler is an 60 y.o., male MRN: [id number] DOB: [birth year] Height: 5\' 9"  (175.3 cm) Weight: 71.2 kg                                                                                                                                                  Insurance Information HMO:     PPO: yes     PCP:      IPA:      80/20:      OTHER:  PRIMARY: [employer]      Policy#: [id number]      Subscriber: pt CM Name: Dot      Phone#: [phone] ext [phone]     Fax#: [phone] Pre-Cert#: [id number] approved for 7 days f/u with Edwena Felty ext [phone] same fax      Employer:  Benefits:  Phone #: [phone]     Name: 5/26 Eff. Date: 11/26/2020     Deduct: none      Out of Pocket Max: $7550      Life Max: none  CIR: $450 per day days 1 until 4      SNF: no copay days 1 until 20; $188 co pay per day days 21 until 42; no copay days 42 until 100 Outpatient: $20 to $40 per visit     Co-Pay: visits per medical neccesity Home Health: 100%      Co-Pay: visits per medical neccesity DME: 80%     Co-Pay: 20% Providers: in network  SECONDARY: Medicaid [place]   Policy#:   [id number] L:    Financial Counselor:       Phone#:    The "Data Collection Information Summary" for patients in Inpatient Rehabilitation Facilities with attached "Privacy Act [place] Records" was provided and verbally reviewed with: Patient and Family   Emergency Contact Information Contact Information       Name Relation Home Work Mobile    [place]  Spouse [phone]         Current Medical History  Patient Admitting Diagnosis: CVA   History of Present Illness: 60 y.o. right-handed male with history of squamous cell carcinoma of the lung, rectal adenocarcinoma, neuropathy, cardiomyopathy with ejection fraction of 30-35%, bilateral pulmonary emboli on Eliquis, atrial fibrillation, CAD/PAD, hypertension, tobacco abuse, chronic back pain followed at [facility].   He presented on 02/23/2021 with  weakness, dysarthria, and left gaze preference. CT/MRI as well as MRA showed multifocal acute ischemia within bilateral, left greater than right multiple vascular territories.  The largest area of abnormality at the posterior left insula.  The pattern most suggestive of embolic process.  Occlusion of the left vertebral artery with distal reconstitution due to flow across the vertebrobasilar confluence.  Normal intracranial MRA MRI cervical spine showed mild spinal canal and bilateral neuro foraminal stenosis at C5-6.  Mild left C2-3, left C3-4 and left C6-7 neural foraminal stenosis.  Admission chemistries unremarkable aside from glucose of 117, alcohol negative, urine drug screen positive marijuana.  Recent echocardiogram with ejection fraction of 30-35%, global hypokinesis.  Neurology follow-up currently maintained on low-dose aspirin as well as Pradaxa for CVA prophylaxis.  Tolerating a regular consistency diet.    Complete NIHSS TOTAL: 3 Glasgow Coma Scale Score: 15   Past Medical History      Past Medical History:  Diagnosis Date  . Chronic lower back pain      a. Followed by pain management at [facility].  . Colon cancer [facility])      rectal cancer  . Coronary artery disease      a. 03/2013: abnl nuc -> LHC s/p DES to LCx, residual moderate disease in LAD (med rx unless refractory angina). b. Not on BB due to bradycardia.  Marland Kitchen DVT (deep venous thrombosis) ([place]) ~ 2013  . Dysrhythmia      AFib  . GERD (gastroesophageal  reflux disease)    . H/O necrotising fasciitis    . History of blood transfusion      "once; after throwing up alot of blood" (04/17/2013)  . Hypertension    . LV dysfunction      a. EF 45% in 03/2013.  Marland Kitchen PAD (peripheral artery disease) ([place])      a. Occlusion of the right internal iliac artery, with significant atherosclerosis in the left internal iliac which was not amenable to reconstruction per notes from [place] in place 05/30/2020  . Pulmonary nodules 09/30/2014  . Rectal cancer ([place])    . Tobacco abuse        Family History  family history includes Bleeding Disorder in his brother; Cancer in his mother; Hypertension in his mother.   Prior Rehab/Hospitalizations:  Has the patient had prior rehab or hospitalizations prior to admission? Yes   Has the patient had major surgery during 100 days prior to admission? No   Current Medications    Current Facility-Administered Medications:  .  aspirin EC tablet 81 mg, 81 mg, Oral, Daily, Heath Lark D, DO, 81 mg at 02/21/21 2025 .  Chlorhexidine Gluconate Cloth 2 % PADS 6 each, 6 each, Topical, Daily, Heath Lark D, DO, 6 each at 02/21/21 [phone] .  dabigatran (PRADAXA) capsule 150 mg, 150 mg, Oral, Q12H, Little Ishikawa, MD, 150 mg at 02/21/21 6237 .  HYDROcodone-acetaminophen (NORCO) 10-325 MG per tablet 1 tablet, 1 tablet, Oral, Q4H PRN, Heath Lark D, DO, 1 tablet at 02/20/21 [phone] .  metoprolol succinate (TOPROL-XL) 24 hr tablet 50 mg, 50 mg, Oral, Daily, Garvin Fila, MD, 50 mg at 02/21/21 0820 .  metoprolol tartrate (LOPRESSOR) injection 5 mg, 5 mg, Intravenous, Q6H PRN, Manuella Ghazi, Pratik D, DO, 5 mg at 02/19/21 0925 .  pantoprazole (PROTONIX) EC tablet 40 mg, 40 mg, Oral, Daily, Manuella Ghazi, Pratik D, DO, 40 mg at 02/21/21 1517 .  simvastatin (ZOCOR) tablet 40 mg, 40 mg, Oral, q1800, Manuella Ghazi,  Pratik D, DO, 40 mg at 02/20/21 1900 .  sodium chloride flush (NS) 0.9 % injection 10-40 mL, 10-40 mL, Intracatheter, PRN, Little Ishikawa, MD, 10 mL at 02/20/21 1100   Patients Current Diet:  Diet Order                  Diet Heart Room service appropriate? Yes; Fluid consistency: Thin  Diet effective now                         Precautions / Restrictions Precautions Precautions: Fall Precaution Comments: Monitor HR Liliane Shi BP - WFL and stable today Restrictions Weight Bearing Restrictions: No    Has the patient had 2 or more falls or a fall with injury in the past year?No   Prior Activity Level Community (5-7x/wk): independent, no AD, drives   Prior Functional Level Prior Function Level of Independence: Independent Comments: I with ADLs/IADLs without AD; drives; shared responsibility with wife for IADLs   Self Care: Did the patient need help bathing, dressing, using the toilet or eating?  Independent   Indoor Mobility: Did the patient need assistance with walking from room to room (with or without device)? Independent   Stairs: Did the patient need assistance with internal or external stairs (with or without device)? Independent   Functional Cognition: Did the patient need help planning regular tasks such as shopping or remembering to take medications? Independent   Home Assistive Devices / Equipment Home Equipment: Cane - single point,Hand held shower head   Prior Device Use: Indicate devices/aids used by the patient prior to current illness, exacerbation or injury? None of the above   Current Functional Level Cognition   Arousal/Alertness: Awake/alert Overall Cognitive Status: Impaired/Different from baseline Current Attention Level: Sustained Orientation Level: Oriented X4 Following Commands: Follows one step commands consistently General Comments: Cues for sequencing of washing face and attending to task. Cues for problem solving weight shifting Attention: Focused,Sustained Focused Attention: Impaired Focused Attention Impairment: Verbal complex,Functional complex Sustained  Attention: Impaired Sustained Attention Impairment: Verbal complex,Functional complex Memory: Impaired Memory Impairment: Decreased recall of new information,Decreased short term memory Awareness: Impaired Problem Solving: Impaired Problem Solving Impairment: Verbal complex,Functional complex Executive Function: Organizing,Self Monitoring Organizing: Impaired Self Monitoring: Impaired Safety/Judgment: Impaired    Extremity Assessment (includes Sensation/Coordination)   Upper Extremity Assessment: RUE deficits/detail RUE Deficits / Details: trace muscle engagement at tricept and bicep. No active movement of hand wrist elbow scapula or elbow. RUE Sensation: decreased light touch,decreased proprioception RUE Coordination: decreased fine motor,decreased gross motor  Lower Extremity Assessment: Defer to PT evaluation RLE Deficits / Details: able to perform hip abd/add for partial ROM, hip flexion contraction, and knee extension contraction in supine. No active DF/PF LLE Sensation: WNL     ADLs   Overall ADL's : Needs assistance/impaired Grooming: Wash/dry face,Maximal assistance,Standing Grooming Details (indicate cue type and reason): Pt standing at sink with Max A +2 for support at R knee and trunk. Faciltiating weight bearing through RUE at elbow and wrist. Pt washing his face with cues for sequencing and faciltiating weight bearing, crossing midling, weight shifts, and visual tracking. Upper Body Bathing: Maximal assistance Toilet Transfer: Moderate assistance,+2 for physical assistance,Stand-pivot Toilet Transfer Details (indicate cue type and reason): Simulated to recliner. Mod A +2 for pivot to recliner. Functional mobility during ADLs: Min guard,Rolling walker General ADL Comments: Pt motivated to participate. presenting with decreased control of RUE, balance, cognition, and strength     Mobility  Overal bed mobility: Needs Assistance Bed Mobility: Supine to Sit Supine to sit:  Min assist Sit to supine: Max assist General bed mobility comments: min assist for completion of trunk elevation, RLE management. Exited bed towards R, PT assisting pt in weight shifting L/R for sequential scooting to EOB.     Transfers   Overall transfer level: Needs assistance Equipment used: 2 person hand held assist Transfers: Sit to/from Stand Sit to Stand: Mod assist,+2 physical assistance General transfer comment: mod +2 for power up, rise, steadying, RLE blocking to prevent severe buckling. Max tactile and verbal cuing to shift weight to L side, pt with preference for R lateral leaning. sit<>stand x3 from EOB and recliner x2. Overall standing tolerance x3 mins     Ambulation / Gait / Stairs / Wheelchair Mobility   Ambulation/Gait Ambulation/Gait assistance: +2 physical assistance,Max assist Gait Distance (Feet): 3 Feet Assistive device: 2 person hand held assist Gait Pattern/deviations: Step-to pattern,Decreased stance time - right,Trunk flexed,Decreased step length - right General Gait Details: Max +2 for truncal support, correcting RLE buckling in stance, facilitiating weight shift L/R, and progressing RLE during swing phase of gait. Multiple episodes of buckling RLE corrected by PT given pt's prefernce for R truncal lean Gait velocity: decr     Posture / Balance Dynamic Sitting Balance Sitting balance - Comments: seated at EOB Balance Overall balance assessment: Needs assistance Sitting-balance support: Single extremity supported,Feet supported Sitting balance-Leahy Scale: Fair Sitting balance - Comments: seated at EOB Standing balance support: Single extremity supported,During functional activity Standing balance-Leahy Scale: Poor Standing balance comment: mod-max +2     Special needs/care consideration Hgb A1c 6.8 THC positive Smoker        Previous Home Environment  Living Arrangements: Spouse/significant other  Lives With: Spouse Available Help at Discharge:  Family,Available 24 hours/day (wife works days; sister can assist) Type of Home: Mobile home Home Layout: One level Home Access: Stairs to enter Entrance Stairs-Rails: Surveyor, mining of Steps: 3-4 Bathroom Shower/Tub: Paramedic: Yes Home Care Services: No   Discharge Living Setting Plans for Discharge Living Setting: Patient's home,Mobile Home,Lives with (comment) (wife) Type of Home at Discharge: Mobile home Discharge Home Layout: One level Discharge Home Access: Stairs to enter Entrance Stairs-Rails: Surveyor, mining of Steps: 3-4 Discharge Bathroom Shower/Tub: Tub/shower unit,Curtain Discharge Bathroom Toilet: Standard Discharge Bathroom Accessibility: Yes How Accessible: Accessible via walker Does the patient have any problems obtaining your medications?: No   Social/Family/Support Systems Contact Information: Chief Financial Officer, wife Anticipated Caregiver: wife and sister Anticipated Ambulance person Information: see above Ability/Limitations of Caregiver: wife works day shift Caregiver Availability: 24/7 Discharge Plan Discussed with Primary Caregiver: Yes Is Caregiver In Agreement with Plan?: Yes Does Caregiver/Family have Issues with Lodging/Transportation while Pt is in Rehab?: No    Patient's sister and family will assist when wife working. Wife will take 1 week off when he first gets home to assist with home transition.   Goals Patient/Family Goal for Rehab: min PT and OT, supervision SLP Expected length of stay: [facility] 17-20 days. Pt/Family Agrees to Admission and willing to participate: Yes Program Orientation Provided & Reviewed with Pt/Caregiver Including Roles  & Responsibilities: Yes   Decrease burden of Care through IP rehab admission: n/a   Possible need for SNF placement upon discharge:not anticipated   Patient Condition: This patient's medical and functional  status has changed since the consult dated: 02/19/2021 in which the Rehabilitation Physician determined and documented that the patient's condition  is appropriate for intensive rehabilitative care in an inpatient rehabilitation facility. See "History of Present Illness" (above) for medical update. Functional changes are: overall mod assist. Patient's medical and functional status update has been discussed with the Rehabilitation physician and patient remains appropriate for inpatient rehabilitation. Will admit to inpatient rehab today.   Preadmission Screen Completed By:  Cleatrice Burke, RN, 02/21/2021 10:27 AM ______________________________________________________________________   Discussed status with Dr. Posey Pronto on 02/21/2021 at  1028 and received approval for admission today.   Admission Coordinator:  Cleatrice Burke, time 8891 Date 02/21/2021           Revision History                                       Note Details  Author Posey Pronto, Domenick Bookbinder, MD File Time 02/21/2021 10:29 AM  Author Type Physician Status Addendum  Last Editor Jamse Arn, MD Service Physical Medicine and Rehabilitation

## 2021-02-21 NOTE — H&P (Signed)
Physical Medicine and Rehabilitation Admission H&P    Chief Complaint  Patient presents with  . Weakness  : HPI: Juan Wheeler is a 60 year old right-handed male with history of squamous cell carcinoma of the lung, rectal adenocarcinoma, neuropathy, cardiomyopathy with ejection fraction of 30 to 35%, bilateral pulmonary emboli on Eliquis, atrial fibrillation, CAD/PAD, hypertension, tobacco abuse, chronic back pain followed at [facility].  History taken from chart review and patient.  Patient lives with spouse and independent prior to admission.  1 level home 3-4 steps to entry.  He presented on 5/29 with weakness, dysarthria, and left gaze preference.  CT/MRI as well as MRA showed multifocal acute ischemia within bilateral left greater than right multiple vascular territories.  The largest area of abnormality at the posterior left insula.  Pattern most suggestive of embolic process.  Occlusion of left vertebral artery with distal reconstitution due to flow across the vertebrobasilar confluence.  Normal intracranial MRA.  MRI cervical spine showed mild spinal canal stenosis and bilateral neuroforaminal stenosis C5-6.  Mild left C2-3 3-4 and C6-7 neuroforaminal stenosis.  Admission chemistries unremarkable except urine drug screen positive for marijuana.  Recent echocardiogram with ejection fraction of 30-35%, global hypokinesis.  Neurology follow-up currently maintained on low-dose aspirin as well as Pradaxa for CVA prophylaxis.  Tolerating a regular diet.  Due to patient's right hemiparesis, dysarthria, and cognitive dysfunction was admitted for a comprehensive rehab program.  Please see preadmission assessment from earlier today as well.  Review of Systems  Constitutional: Positive for malaise/fatigue. Negative for chills and fever.  HENT: Negative for hearing loss.   Eyes: Negative for blurred vision and double vision.  Respiratory: Negative for cough.        Occasional shortness of  breath with exertion  Cardiovascular: Positive for palpitations.  Gastrointestinal: Positive for constipation. Negative for heartburn, nausea and vomiting.       GERD  Genitourinary: Negative for hematuria.  Musculoskeletal: Positive for joint pain and myalgias.  Skin: Negative for rash.  Neurological: Positive for speech change, focal weakness and weakness.  All other systems reviewed and are negative.  Past Medical History:  Diagnosis Date  . Chronic lower back pain    a. Followed by pain management at [facility].  . Colon cancer [facility])    rectal cancer  . Coronary artery disease    a. 03/2013: abnl nuc -> LHC s/p DES to LCx, residual moderate disease in LAD (med rx unless refractory angina). b. Not on BB due to bradycardia.  Marland Kitchen DVT (deep venous thrombosis) ([place]) ~ 2013  . Dysrhythmia    AFib  . GERD (gastroesophageal reflux disease)   . H/O necrotising fasciitis   . History of blood transfusion    "once; after throwing up alot of blood" (04/17/2013)  . Hypertension   . LV dysfunction    a. EF 45% in 03/2013.  Marland Kitchen PAD (peripheral artery disease) ([place])    a. Occlusion of the right internal iliac artery, with significant atherosclerosis in the left internal iliac which was not amenable to reconstruction per notes from [place] in place 05/30/2020  . Pulmonary nodules 09/30/2014  . Rectal cancer ([place])   . Tobacco abuse    Past Surgical History:  Procedure Laterality Date  . ABDOMINAL SURGERY  1990's   'for stomach ulcers" (04/17/2013)  . COLECTOMY  2012   "for rectal cancer" (04/17/2013)  . COLONOSCOPY  2013   Dr. Cheryll Cockayne: colorectal anastomosis with ulcer  and inflammation, benign biopsy  . COLONOSCOPY WITH PROPOFOL N/A 09/07/2016   Procedure: COLONOSCOPY WITH PROPOFOL;  Surgeon: Daneil Dolin, MD;  [facility];  Service: Endoscopy;  Laterality: N/A;  10:00 am Colonoscopy via rectum and ostomy  . COLOSTOMY TAKEDOWN  2013  . CORONARY ANGIOPLASTY  WITH STENT PLACEMENT  04/17/2013   "?1" (04/17/2013)  . FEMORAL-POPLITEAL BYPASS GRAFT Left 07/02/2015   Procedure: BYPASS GRAFT LEFT COMMON FEMORAL ARTERY TO LEFT ABOVE KNEE POPLITEAL ARTERY - USING LEFT GREATER SAPPHENOUS VEIN;  Surgeon: Elam Dutch, MD;  Location: [place];  Service: Vascular;  Laterality: Left;  . INCISION AND DRAINAGE ABSCESS Left 06/05/2016   Procedure: INCISION AND DRAINAGE ABSCESS;  Surgeon: Aviva Signs, MD;  Location: [facility] OR[facility];  Service: General;  Laterality: Left;  . INCISION AND DRAINAGE PERIRECTAL ABSCESS Left 06/07/2016   Procedure: IRRIGATION AND DEBRIDEMENT LEFT BUTTOCK ABSCESS;  Surgeon: Greer Pickerel, MD;  Location: [place];  Service: General;  Laterality: Left;  . INGUINAL HERNIA REPAIR Bilateral 1990's  . LAPAROSCOPIC PARTIAL COLECTOMY N/A 06/11/2016   Procedure: LAPAROSCOPIC  OPEN COLOSTOMY;  Surgeon: [name], MD;  Location: [place];  Service: General;  Laterality: N/A;  . LEFT HEART CATHETERIZATION WITH CORONARY ANGIOGRAM N/A 04/17/2013   Procedure: LEFT HEART CATHETERIZATION WITH CORONARY ANGIOGRAM;  Surgeon: Peter M Martinique, MD;  Location: [facility] CATH LAB;  Service: Cardiovascular;  Laterality: N/A;  . PERCUTANEOUS STENT INTERVENTION  04/17/2013   Procedure: PERCUTANEOUS STENT INTERVENTION;  Surgeon: Peter M Martinique, MD;  Location: [facility] CATH LAB;  Service: Cardiovascular;;  . PERIPHERAL VASCULAR CATHETERIZATION N/A 06/14/2015   Procedure: Abdominal Aortogram;  Surgeon: Elam Dutch, MD;  Location: [place] CV LAB;  Service: Cardiovascular;  Laterality: N/A;  . PERIPHERAL VASCULAR CATHETERIZATION Bilateral 06/14/2015   Procedure: Lower Extremity Angiography;  Surgeon: Elam Dutch, MD;  Location: [place] CV LAB;  Service: Cardiovascular;  Laterality: Bilateral;  . PORTACATH PLACEMENT Left 06/05/2020   Procedure: INSERTION PORT-A-CATH;  Surgeon: Aviva Signs, MD;  Location: [facility];  Service: General;  Laterality: Left;  Marland Kitchen VEIN HARVEST Left 07/02/2015    Procedure: VEIN HARVEST - LEFT GREATER SAPPHENOUS VEIN;  Surgeon: Elam Dutch, MD;  Location: [facility] OR;  Service: Vascular;  Laterality: Left;   Family History  Problem Relation Age of Onset  . Cancer Mother   . Hypertension Mother   . Bleeding Disorder Brother    Social History:  reports that he has been smoking cigarettes. He started smoking about 46 years ago. He has a 10.00 pack-year smoking history. He has never used smokeless tobacco. He reports current alcohol use of about 3.0 standard drinks of alcohol per week. He reports current drug use. Drug: Marijuana. Allergies:  Allergies  Allergen Reactions  . Tramadol Other (See Comments)    Felt sluggish and ineffective   . Darvocet [Propoxyphene N-Acetaminophen] Palpitations   Medications Prior to Admission  Medication Sig Dispense Refill  . apixaban (ELIQUIS) 5 MG TABS tablet Take 1 tablet (5 mg total) by mouth 2 (two) times daily. 60 tablet 1  . aspirin EC 81 MG tablet Take 81 mg by mouth daily.    [name] Oris IV Inject into the vein every 14 (fourteen) days.    Marland Kitchen HYDROcodone-acetaminophen (NORCO) 10-325 MG tablet Take 1 tablet by mouth every 4 (four) hours as needed. (Patient taking differently: Take 1 tablet by mouth every 4 (four) hours as needed for severe pain.) 180 tablet 0  . isosorbide mononitrate (IMDUR) 30  MG 24 hr tablet TAKE 1/2 TABLET BY MOUTH ONCE DAILY. (Patient taking differently: Take 15 mg by mouth daily.) 15 tablet 3  . losartan (COZAAR) 25 MG tablet Take 0.5 tablets (12.5 mg total) by mouth daily. 30 tablet 1  . metoprolol succinate (TOPROL-XL) 25 MG 24 hr tablet Take 1 tablet (25 mg total) by mouth daily. 30 tablet 1  . NITROSTAT 0.4 MG SL tablet PLACE 1 TABLET UNDER THE TONGUE AS NEEDED FOR CHEST PAIN UP TO 3 DOSES (Patient taking differently: Place 0.4 mg under the tongue every 5 (five) minutes as needed for chest pain.) 25 tablet 3  . omeprazole (PRILOSEC) 40 MG capsule Take 40 mg by mouth daily.    Marland Kitchen  oxyCODONE-acetaminophen (PERCOCET) 10-325 MG tablet Take 1 tablet by mouth every 6 (six) hours as needed for pain. 60 tablet 0    Drug Regimen Review Drug regimen was reviewed and remains appropriate with no significant issues identified  Home: Home Living Family/patient expects to be discharged to:: Private residence Living Arrangements: Spouse/significant other Available Help at Discharge: Family,Available 24 hours/day (wife works days; sister can assist) Type of Home: Mobile home Home Access: Stairs to enter Technical brewer of Steps: 3-4 Entrance Stairs-Rails: Right,Left Home Layout: One level Bathroom Shower/Tub: Engineer, petroleum: Standard Bathroom Accessibility: Yes Home Equipment: Cane - single point,Hand held shower head  Lives With: Spouse   Functional History: Prior Function Level of Independence: Independent Comments: I with ADLs/IADLs without AD; drives; shared responsibility with wife for IADLs  Functional Status:  Mobility: Bed Mobility Overal bed mobility: Needs Assistance Bed Mobility: Supine to Sit Supine to sit: Min assist Sit to supine: Max assist General bed mobility comments: min assist for completion of trunk elevation, RLE management. Exited bed towards R, PT assisting pt in weight shifting L/R for sequential scooting to EOB. Transfers Overall transfer level: Needs assistance Equipment used: 2 person hand held assist Transfers: Sit to/from Stand Sit to Stand: Mod assist,+2 physical assistance General transfer comment: mod +2 for power up, rise, steadying, RLE blocking to prevent severe buckling. Max tactile and verbal cuing to shift weight to L side, pt with preference for R lateral leaning. sit<>stand x3 from EOB and recliner x2. Overall standing tolerance x3 mins Ambulation/Gait Ambulation/Gait assistance: +2 physical assistance,Max assist Gait Distance (Feet): 3 Feet Assistive device: 2 person hand held assist Gait  Pattern/deviations: Step-to pattern,Decreased stance time - right,Trunk flexed,Decreased step length - right General Gait Details: Max +2 for truncal support, correcting RLE buckling in stance, facilitiating weight shift L/R, and progressing RLE during swing phase of gait. Multiple episodes of buckling RLE corrected by PT given pt's prefernce for R truncal lean Gait velocity: decr    ADL: ADL Overall ADL's : Needs assistance/impaired Grooming: Wash/dry face,Maximal assistance,Standing Grooming Details (indicate cue type and reason): Pt standing at sink with Max A +2 for support at R knee and trunk. Faciltiating weight bearing through RUE at elbow and wrist. Pt washing his face with cues for sequencing and faciltiating weight bearing, crossing midling, weight shifts, and visual tracking. Upper Body Bathing: Maximal assistance Toilet Transfer: Moderate assistance,+2 for physical assistance,Stand-pivot Toilet Transfer Details (indicate cue type and reason): Simulated to recliner. Mod A +2 for pivot to recliner. Functional mobility during ADLs: Min guard,Rolling walker General ADL Comments: Pt motivated to participate. presenting with decreased control of RUE, balance, cognition, and strength  Cognition: Cognition Overall Cognitive Status: Impaired/Different from baseline Arousal/Alertness: Awake/alert Orientation Level: Oriented X4 Attention: Focused,Sustained Focused Attention:  Impaired Focused Attention Impairment: Verbal complex,Functional complex Sustained Attention: Impaired Sustained Attention Impairment: Verbal complex,Functional complex Memory: Impaired Memory Impairment: Decreased recall of new information,Decreased short term memory Awareness: Impaired Problem Solving: Impaired Problem Solving Impairment: Verbal complex,Functional complex Executive Function: Organizing,Self Monitoring Organizing: Impaired Self Monitoring: Impaired Safety/Judgment:  Impaired Cognition Arousal/Alertness: Awake/alert Behavior During Therapy: WFL for tasks assessed/performed Overall Cognitive Status: Impaired/Different from baseline Area of Impairment: Attention,Following commands,Problem solving Current Attention Level: Sustained Following Commands: Follows one step commands consistently Problem Solving: Slow processing,Requires verbal cues,Difficulty sequencing General Comments: Cues for sequencing of washing face and attending to task. Cues for problem solving weight shifting  Physical Exam: Blood pressure 118/73, pulse 88, temperature 98.3 F (36.8 C), temperature source Oral, resp. rate 18, height 5\' 9"  (1.753 m), weight 71.2 kg, SpO2 93 %. Physical Exam Vitals reviewed.  Constitutional:      General: He is not in acute distress.    Appearance: He is not ill-appearing.  HENT:     Head: Normocephalic and atraumatic.     Right Ear: External ear normal.     Left Ear: External ear normal.     Nose: Nose normal.  Eyes:     General:        Right eye: No discharge.        Left eye: No discharge.     Extraocular Movements: Extraocular movements intact.  Cardiovascular:     Rate and Rhythm: Normal rate and regular rhythm.  Pulmonary:     Effort: Pulmonary effort is normal. No respiratory distress.     Breath sounds: No stridor.  Abdominal:     General: Abdomen is flat. Bowel sounds are normal. There is no distension.  Musculoskeletal:     Cervical back: Normal range of motion and neck supple.     Comments: No edema or tenderness in extremities  Skin:    Comments: No edema or tenderness in extremities  Neurological:     Mental Status: He is alert.     Comments: Alert No acute distress.   Makes eye contact with examiner.   Left gaze preference.   Mild dysarthria but intelligible.   Provides name and age.  Follows commands. Motor: LUE/LLE: 5/5 proximal distal RUE: 1+-2 -/5 proximal to distal with apraxia RLE: 0/5 proximal  distal Increase in tone right lower extremity  Psychiatric:        Speech: Speech is slurred.        Behavior: Behavior is slowed.     Results for orders placed or performed during the hospital encounter of 02/15/21 (from the past 48 hour(s))  CBC     Status: Abnormal   Collection Time: 02/20/21  5:51 AM  Result Value Ref Range   WBC 7.2 4.0 - 10.5 K/uL   RBC 5.16 4.22 - 5.81 MIL/uL   Hemoglobin 17.1 (H) 13.0 - 17.0 g/dL   HCT 50.4 39.0 - 52.0 %   MCV 97.7 80.0 - 100.0 fL   MCH 33.1 26.0 - 34.0 pg   MCHC 33.9 30.0 - 36.0 g/dL   RDW 15.4 11.5 - 15.5 %   Platelets 123 (L) 150 - 400 K/uL    Comment: REPEATED TO VERIFY   nRBC 0.0 0.0 - 0.2 %    Comment: Performed at [facility], [place] [address], [place], [place] [address]  Basic metabolic panel     Status: Abnormal   Collection Time: 02/20/21  5:51 AM  Result Value Ref Range   Sodium 136  135 - 145 mmol/L   Potassium 4.2 3.5 - 5.1 mmol/L   Chloride 101 98 - 111 mmol/L   CO2 24 22 - 32 mmol/L   Glucose, Bld 110 (H) 70 - 99 mg/dL    Comment: Glucose reference range applies only to samples taken after fasting for at least 8 hours.   BUN 12 6 - 20 mg/dL   Creatinine, Ser 1.40 (H) 0.61 - 1.24 mg/dL   Calcium 9.5 8.9 - 10.3 mg/dL   GFR, Estimated 58 (L) >60 mL/min    Comment: (NOTE) Calculated using the CKD-EPI Creatinine Equation (2021)    Anion gap 11 5 - 15    Comment: Performed at [place] [address], [place], [place] [address]  CBC     Status: Abnormal   Collection Time: 02/21/21  6:03 AM  Result Value Ref Range   WBC 7.8 4.0 - 10.5 K/uL   RBC 4.95 4.22 - 5.81 MIL/uL   Hemoglobin 16.6 13.0 - 17.0 g/dL   HCT 47.7 39.0 - 52.0 %   MCV 96.4 80.0 - 100.0 fL   MCH 33.5 26.0 - 34.0 pg   MCHC 34.8 30.0 - 36.0 g/dL   RDW 15.4 11.5 - 15.5 %   Platelets 130 (L) 150 - 400 K/uL    Comment: REPEATED TO VERIFY   nRBC 0.0 0.0 - 0.2 %    Comment: Performed at [facility], [place] [address], [place],  [place] [address]  Basic metabolic panel     Status: Abnormal   Collection Time: 02/21/21  6:03 AM  Result Value Ref Range   Sodium 137 135 - 145 mmol/L   Potassium 4.7 3.5 - 5.1 mmol/L   Chloride 104 98 - 111 mmol/L   CO2 25 22 - 32 mmol/L   Glucose, Bld 104 (H) 70 - 99 mg/dL    Comment: Glucose reference range applies only to samples taken after fasting for at least 8 hours.   BUN 16 6 - 20 mg/dL   Creatinine, Ser 1.50 (H) 0.61 - 1.24 mg/dL   Calcium 9.3 8.9 - 10.3 mg/dL   GFR, Estimated 53 (L) >60 mL/min    Comment: (NOTE) Calculated using the CKD-EPI Creatinine Equation (2021)    Anion gap 8 5 - 15    Comment: Performed at [place] [facility] [address], [place], [place] [address]   No results found.     Medical Problem List and Plan: 1.  Dysarthria/right hemiparesis with left gaze preference secondary to multifocal acute ischemia within the bilateral, left greater than right multiple vascular territories due to left vertebral artery occlusion.  -patient may shower  -ELOS/Goals: 14-17 days/supervision/min a  Admit to CIR 2.  Antithrombotics: -DVT/anticoagulation: Pradaxa  -antiplatelet therapy: Aspirin 81 mg daily 3. Pain Management: Hydrocodone as needed 4. Mood: Provide emotional support  -antipsychotic agents: N/A 5. Neuropsych: This patient is capable of making decisions on his own behalf. 6. Skin/Wound Care: Routine skin checks 7. Fluids/Electrolytes/Nutrition: Routine in and outs  CMP ordered 8.  Hypertension.  Toprol-XL 50 mg daily.    Monitor with increased mobility 9.  Hyperlipidemia: Zocor 10.  History of squamous cell carcinoma of the lung/rectal adenocarcinoma.  Follow-up outpatient 11.  Cardiomyopathy with history of bilateral pulmonary emboli.  Continue Pradaxa 12.  Atrial fibrillation.  Cardiac rate controlled  Monitor heart rate with increased physical activity 13.  History of tobacco abuse.  Counseling 14.  GERD: Protonix drop with meds as  below 15.   Thrombocytopenia  Platelets 130 on 5/27, CBC ordered  Cathlyn Parsons, [name] 02/21/2021  I have personally performed a face to face diagnostic evaluation, including, but not limited to relevant history and physical exam findings, of this patient and developed relevant assessment and plan.  Additionally, I have reviewed and concur with the physician assistant's documentation above.  Delice Lesch, MD, ABPMR

## 2021-02-21 NOTE — TOC Transition Note (Signed)
Transition of Care [facility]) - CM/SW Discharge Note   Patient Details  Name: Juan Wheeler MRN: [id number] Date of Birth: [birth year]  Transition [facility]) CM/SW Contact:  Pollie Friar, RN Phone Number: 02/21/2021, 10:43 AM   Clinical Narrative:    Patient discharging to CIR today. CM signing off.   Final next level of care: IP Rehab Facility Barriers to Discharge: No Barriers Identified   Patient Goals and CMS Choice        Discharge Placement                       Discharge Plan and Services                                     Social Determinants of Health (SDOH) Interventions     Readmission Risk Interventions No flowsheet data found.

## 2021-02-21 NOTE — Progress Notes (Signed)
Patient ID: MAXIME BECKNER, male   DOB: Oct 09, 1960, 60 y.o.   MRN: [id number]  Met with patient in room, introduced myself, and explained my role in his care. Explained the rehab process, rehab scheduled, and what takes place during Team Conference on Tuesdays. Gave him additional educational handouts on heart healthy eating, DASH diet, cooking with less salt, HTN, managing your HTN, smoking tobacco, managing the challenge of quitting smoking, steps to quitting smoking, CAD, chronic back pain, and managing chronic back pain. I explained that he would be able to wear comfortable clothing during his therapy sessions and I would let his family know so that they could bring him some clothing. He did not have any questions at this time. I will continue to monitor his progress on rehab.  Dorthula Nettles, RN3, BSN, CBIS, [place], [facility], Inpatient Rehabilitation Office [phone] Cell [phone]

## 2021-02-21 NOTE — Progress Notes (Signed)
Inpatient Rehabilitation Admissions Coordinator  I have insurance approval and CIR bed to admit patient to CIR today. I met with patient at bedside and he is in agreement. I have alerted acute team and TOC. I will make the arrangements to admit today.  Danne Baxter, RN, MSN Rehab Admissions Coordinator [phone] 02/21/2021 10:23 AM

## 2021-02-22 DIAGNOSIS — I639 Cerebral infarction, unspecified: Secondary | ICD-10-CM

## 2021-02-22 DIAGNOSIS — I10 Essential (primary) hypertension: Secondary | ICD-10-CM | POA: Diagnosis not present

## 2021-02-22 DIAGNOSIS — I482 Chronic atrial fibrillation, unspecified: Secondary | ICD-10-CM

## 2021-02-22 MED ORDER — CHLORHEXIDINE GLUCONATE CLOTH 2 % EX PADS
6.0000 | MEDICATED_PAD | Freq: Every day | CUTANEOUS | Status: DC
  Administered 2021-02-22 – 2021-02-23 (×2): 6 via TOPICAL

## 2021-02-22 NOTE — Evaluation (Signed)
Physical Therapy Assessment and Plan  Patient Details  Name: Juan Wheeler MRN: [id number] Date of Birth: 1961-05-13  PT Diagnosis: Abnormal posture, Coordination disorder, Hemiplegia dominant, Hypotonia and Muscle weakness Rehab Potential: Good ELOS: 16-21 days   Today's Date: 02/22/2021 PT Individual Time: 1100-1155 PT Individual Time Calculation (min): 55 min    Hospital Problem: Principal Problem:   Acute cerebral infarction [facility]) Active Problems:   Ischemic cerebrovascular accident (CVA) of frontal lobe ([place])   Past Medical History:  Past Medical History:  Diagnosis Date  . Chronic lower back pain    a. Followed by pain management at [facility].  . Colon cancer [facility])    rectal cancer  . Coronary artery disease    a. 03/2013: abnl nuc -> LHC s/p DES to LCx, residual moderate disease in LAD (med rx unless refractory angina). b. Not on BB due to bradycardia.  Marland Kitchen DVT (deep venous thrombosis) ([place]) ~ 2013  . Dysrhythmia    AFib  . GERD (gastroesophageal reflux disease)   . H/O necrotising fasciitis   . History of blood transfusion    "once; after throwing up alot of blood" (04/17/2013)  . Hypertension   . LV dysfunction    a. EF 45% in 03/2013.  Marland Kitchen PAD (peripheral artery disease) ([place])    a. Occlusion of the right internal iliac artery, with significant atherosclerosis in the left internal iliac which was not amenable to reconstruction per notes from [place] in place 05/30/2020  . Pulmonary nodules 09/30/2014  . Rectal cancer ([place])   . Tobacco abuse    Past Surgical History:  Past Surgical History:  Procedure Laterality Date  . ABDOMINAL SURGERY  1990's   'for stomach ulcers" (04/17/2013)  . COLECTOMY  2012   "for rectal cancer" (04/17/2013)  . COLONOSCOPY  2013   Dr. Cheryll Cockayne: colorectal anastomosis with ulcer and inflammation, benign biopsy  . COLONOSCOPY WITH PROPOFOL N/A 09/07/2016   Procedure: COLONOSCOPY WITH PROPOFOL;  Surgeon: Daneil Dolin, MD;  [facility];  Service: Endoscopy;  Laterality: N/A;  10:00 am Colonoscopy via rectum and ostomy  . COLOSTOMY TAKEDOWN  2013  . CORONARY ANGIOPLASTY WITH STENT PLACEMENT  04/17/2013   "?1" (04/17/2013)  . FEMORAL-POPLITEAL BYPASS GRAFT Left 07/02/2015   Procedure: BYPASS GRAFT LEFT COMMON FEMORAL ARTERY TO LEFT ABOVE KNEE POPLITEAL ARTERY - USING LEFT GREATER SAPPHENOUS VEIN;  Surgeon: Elam Dutch, MD;  Location: [place];  Service: Vascular;  Laterality: Left;  . INCISION AND DRAINAGE ABSCESS Left 06/05/2016   Procedure: INCISION AND DRAINAGE ABSCESS;  Surgeon: Aviva Signs, MD;  Location: [facility] ORS;  Service: General;  Laterality: Left;  . INCISION AND DRAINAGE PERIRECTAL ABSCESS Left 06/07/2016   Procedure: IRRIGATION AND DEBRIDEMENT LEFT BUTTOCK ABSCESS;  Surgeon: Greer Pickerel, MD;  Location: [place];  Service: General;  Laterality: Left;  . INGUINAL HERNIA REPAIR Bilateral 1990's  . LAPAROSCOPIC PARTIAL COLECTOMY N/A 06/11/2016   Procedure: LAPAROSCOPIC  OPEN COLOSTOMY;  Surgeon: [name], MD;  Location: [place];  Service: General;  Laterality: N/A;  . LEFT HEART CATHETERIZATION WITH CORONARY ANGIOGRAM N/A 04/17/2013   Procedure: LEFT HEART CATHETERIZATION WITH CORONARY ANGIOGRAM;  Surgeon: Peter M Martinique, MD;  Location: [facility] CATH LAB;  Service: Cardiovascular;  Laterality: N/A;  . PERCUTANEOUS STENT INTERVENTION  04/17/2013   Procedure: PERCUTANEOUS STENT INTERVENTION;  Surgeon: Peter M Martinique, MD;  Location: [facility] CATH LAB;  Service: Cardiovascular;;  . PERIPHERAL VASCULAR CATHETERIZATION N/A 06/14/2015  Procedure: Abdominal Aortogram;  Surgeon: Elam Dutch, MD;  Location: [place] CV LAB;  Service: Cardiovascular;  Laterality: N/A;  . PERIPHERAL VASCULAR CATHETERIZATION Bilateral 06/14/2015   Procedure: Lower Extremity Angiography;  Surgeon: Elam Dutch, MD;  Location: [place] CV LAB;  Service: Cardiovascular;  Laterality: Bilateral;  . PORTACATH PLACEMENT  Left 06/05/2020   Procedure: INSERTION PORT-A-CATH;  Surgeon: Aviva Signs, MD;  Location: [facility];  Service: General;  Laterality: Left;  Marland Kitchen VEIN HARVEST Left 07/02/2015   Procedure: VEIN HARVEST - LEFT GREATER SAPPHENOUS VEIN;  Surgeon: Elam Dutch, MD;  Location: [facility] OR;  Service: Vascular;  Laterality: Left;    Assessment & Plan Clinical Impression: Patient is a 60 year old right-handed male with history of squamous cell carcinoma of the lung, rectal adenocarcinoma, neuropathy, cardiomyopathy with ejection fraction of 30 to 35%, bilateral pulmonary emboli on Eliquis, atrial fibrillation, CAD/PAD, hypertension, tobacco abuse, chronic back pain followed at [facility].  History taken from chart review and patient.  Patient lives with spouse and independent prior to admission.  1 level home 3-4 steps to entry.  He presented on 5/29 with weakness, dysarthria, and left gaze preference.  CT/MRI as well as MRA showed multifocal acute ischemia within bilateral left greater than right multiple vascular territories.  The largest area of abnormality at the posterior left insula.  Pattern most suggestive of embolic process.  Occlusion of left vertebral artery with distal reconstitution due to flow across the vertebrobasilar confluence.  Normal intracranial MRA.  MRI cervical spine showed mild spinal canal stenosis and bilateral neuroforaminal stenosis C5-6.  Mild left C2-3 3-4 and C6-7 neuroforaminal stenosis.  Admission chemistries unremarkable except urine drug screen positive for marijuana.  Recent echocardiogram with ejection fraction of 30-35%, global hypokinesis.  Neurology follow-up currently maintained on low-dose aspirin as well as Pradaxa for CVA prophylaxis.  Tolerating a regular diet.  Due to patient's right hemiparesis, dysarthria, and cognitive dysfunction was admitted for a comprehensive rehab program.   Patient transferred to CIR on 02/21/2021 .   Patient currently requires max with mobility  secondary to muscle weakness, muscle joint tightness and muscle paralysis, decreased cardiorespiratoy endurance, abnormal tone, unbalanced muscle activation and decreased coordination, decreased attention to right and decreased sitting balance, decreased standing balance, decreased postural control, hemiplegia and decreased balance strategies.  Prior to hospitalization, patient was modified independent  with mobility and lived with Spouse,Family in a Mobile home home.  Home access is 3-4Stairs to enter.  Patient will benefit from skilled PT intervention to maximize safe functional mobility, minimize fall risk and decrease caregiver burden for planned discharge home with 24 hour assist.  Anticipate patient will benefit from follow up [facility] at discharge.  PT - End of Session Activity Tolerance: Tolerates < 10 min activity, no significant change in vital signs Endurance Deficit: Yes Endurance Deficit Description: Reports significant fatigue after seated ADL PT Assessment Rehab Potential (ACUTE/IP ONLY): Good PT Barriers to Discharge: [place] home environment;Home environment access/layout;Decreased caregiver support;Wound Care;Lack of/limited family support;Nutrition means PT Patient demonstrates impairments in the following area(s): Balance;Behavior;Edema;Endurance;Motor;Nutrition;Pain;Perception;Safety;Sensory;Skin Integrity PT Transfers Functional Problem(s): Bed Mobility;Bed to Chair;Car;Furniture;Floor PT Locomotion Functional Problem(s): Ambulation;Wheelchair Mobility;Stairs PT Plan PT Intensity: Minimum of 1-2 x/day ,45 to 90 minutes PT Frequency: 5 out of 7 days PT Duration Estimated Length of Stay: 16-21 days PT Treatment/Interventions: Ambulation/gait training;Community reintegration;DME/adaptive equipment instruction;Neuromuscular re-education;Psychosocial support;Stair training;UE/LE Strength taining/ROM;Wheelchair propulsion/positioning;UE/LE Coordination activities;Therapeutic  Activities;Skin care/wound management;Pain management;Functional electrical stimulation;Discharge planning;Balance/vestibular training;Disease management/prevention;Cognitive remediation/compensation;Functional mobility training;Patient/family education;Splinting/orthotics;Therapeutic Exercise;Visual/perceptual remediation/compensation  PT Transfers Anticipated Outcome(s): Min assist trasnfers with LRAD PT Locomotion Anticipated Outcome(s): Min assist gait with LRAD at [place] hold distances. supervision assist WC mobility PT Recommendation Follow Up Recommendations: Home health PT Patient destination: Home Equipment Recommended: To be determined;Wheelchair cushion (measurements);Wheelchair (measurements);Rolling walker with 5" wheels   PT Evaluation Precautions/Restrictions   fall ostomy General   Vital SignsTherapy Vitals Temp: 97.6 F (36.4 C) Pulse Rate: 89 Resp: 16 BP: 99/65 Patient Position (if appropriate): Lying Oxygen Therapy SpO2: 91 % O2 Device: Room Air Pain   denies Home Living/Prior Functioning Home Living Available Help at Discharge: Family;Available PRN/intermittently (wife works during day but plans to stay home 24/7 upon DC) Type of Home: Mobile home Home Access: Stairs to enter [employer] of Steps: 3-4 Entrance Stairs-Rails: Right;Left Home Layout: One level Bathroom Shower/Tub: Product/process development scientist: Standard Bathroom Accessibility: Yes Additional Comments: unsure if WC would fit.  Lives With: Spouse;Family Prior Function Level of Independence: Independent with basic ADLs;Independent with homemaking with ambulation;Independent with gait  Able to Take Stairs?: Yes Driving: No Comments: I with ADLs/IADLs without AD; drives; shared responsibility with wife for IADLs Vision/Perception  Vision - Assessment Ocular Range of Motion: Restricted on the right Alignment/Gaze Preference: Head tilt;Gaze left Tracking/Visual  Pursuits: Decreased smoothness of vertical tracking;Decreased smoothness of horizontal tracking Convergence: Impaired (comment) Perception Inattention/Neglect: Does not attend to right side of body Praxis Praxis: Impaired Praxis Impairment Details: Ideomotor  Cognition   Sensation Sensation Light Touch: Impaired by gross assessment (unable to appreciate LT to R digits/toes) Light Touch Impaired Details: Impaired RUE;Impaired RLE Hot/Cold: Not tested Proprioception: Impaired by gross assessment Stereognosis: Impaired by gross assessment Coordination Gross Motor Movements are Fluid and Coordinated: No Fine Motor Movements are Fluid and Coordinated: No Coordination and Movement Description: dense R hemi, head tilt L Finger Nose Finger Test: unable to complete R side Motor  Motor Motor: Hemiplegia;Abnormal tone;Abnormal postural alignment and control Motor - Skilled Clinical Observations: dense R hemi, kyphotic, L head tilt   Trunk/Postural Assessment  Cervical Assessment Cervical Assessment: Exceptions to [facility] (L head tilt, forward head) Thoracic Assessment Thoracic Assessment: Exceptions to [facility] (increased kyphosis) Lumbar Assessment Lumbar Assessment: Exceptions to [facility] (posterior pelvic tilt) Postural Control Postural Control: Deficits on evaluation  Balance Balance Balance Assessed: Yes Static Sitting Balance Static Sitting - Balance Support: Feet supported Static Sitting - Level of Assistance: 3: Mod assist;4: Min assist Dynamic Sitting Balance Dynamic Sitting - Balance Support: Feet supported Dynamic Sitting - Level of Assistance: 3: Mod assist Static Standing Balance Static Standing - Balance Support: No upper extremity supported Static Standing - Level of Assistance: 2: Max assist Extremity Assessment  RUE Assessment RUE Assessment: Exceptions to [facility] General Strength Comments: 0/5 proximal to distal RUE Body System: Neuro Brunstrum levels for arm and hand:  Arm;Hand Brunstrum level for arm: Stage I Presynergy Brunstrum level for hand: Stage I Flaccidity LUE Assessment LUE Assessment: Within Functional Limits      Care Tool Care Tool Bed Mobility Roll left and right activity   Roll left and right assist level: Moderate Assistance - Patient 50 - 74%    Sit to lying activity   Sit to lying assist level: Maximal Assistance - Patient 25 - 49%    Lying to sitting edge of bed activity   Lying to sitting edge of bed assist level: Maximal Assistance - Patient 25 - 49%     Care Tool Transfers Sit to stand transfer   Sit to stand assist  level: Maximal Assistance - Patient 25 - 49%    Chair/bed transfer   Chair/bed transfer assist level: Maximal Assistance - Patient 25 - 49%     Physiological scientist transfer assist level: Maximal Assistance - Patient 25 - 49%      Care Tool Locomotion Ambulation Ambulation activity did not occur: Safety/medical concerns        Walk 10 feet activity Walk 10 feet activity did not occur: Safety/medical concerns       Walk 50 feet with 2 turns activity Walk 50 feet with 2 turns activity did not occur: Safety/medical concerns      Walk 150 feet activity Walk 150 feet activity did not occur: Safety/medical concerns      Walk 10 feet on uneven surfaces activity Walk 10 feet on uneven surfaces activity did not occur: Safety/medical concerns      Stairs Stair activity did not occur: Safety/medical concerns        Walk up/down 1 step activity Walk up/down 1 step or curb (drop down) activity did not occur: Safety/medical concerns     Walk up/down 4 steps activity did not occuR: Safety/medical concerns  Walk up/down 4 steps activity      Walk up/down 12 steps activity Walk up/down 12 steps activity did not occur: Safety/medical concerns      Pick up small objects from floor Pick up small object from the floor (from standing position) activity did not occur: Safety/medical  concerns      Wheelchair Will patient use wheelchair at discharge?: Yes     Wheelchair assist level: Moderate Assistance - Patient 50 - 74% Max wheelchair distance: 150  Wheel 50 feet with 2 turns activity   Assist Level: Moderate Assistance - Patient 50 - 74%  Wheel 150 feet activity   Assist Level: Moderate Assistance - Patient 50 - 74%    Refer to Care Plan for Long Term Goals  SHORT TERM GOAL WEEK 1 PT Short Term Goal 1 (Week 1): Pt will perform bed mobility with mod assist PT Short Term Goal 2 (Week 1): Pt will transfer to [facility] with mod assist PT Short Term Goal 3 (Week 1): Pt will ambulate with mod assist +2 for safety and LRAD x 32f PT Short Term Goal 4 (Week 1): Pt will propell WC with supervisoin assist >1099f Recommendations for other services: None   Skilled Therapeutic Intervention   Pt received supine in bed and agreeable to PT. Supine>sit transfer with max asist for control of R side. PT instructed patient in PT Evaluation and initiated treatment intervention; see above for results. PT educated patient in [place], rehab goals, and discharge recommendations along with recommendation for follow-up rehabilitation services. WC mobility with hemi technique. Sit<>stand in parallel bars with mod-max assist due to knee instability. Gait training with max assist at rail in hall as listed below to block the RLE in stance and assist with adveancement. Noted to have trace activation in stance and limb advancement. Car transfer with max assist to block the RLE to prevent knee buckle with poor motor planning and attention to the RLE. Pt returned to room and performed squat pivot transfer to bed with maxA. Sit>supine completed with max assist and left supine in bed with call bell in reach and all needs met.      Mobility Bed Mobility Bed Mobility: Rolling Right;Right Sidelying to Sit;Supine to Sit Rolling Right: Moderate Assistance -  Patient 50-74% Right Sidelying to  Sit: Moderate Assistance - Patient 50-74% Supine to Sit: Moderate Assistance - Patient 50-74% Transfers Transfers: Sit to Stand;Stand to Sit;Squat Pivot Transfers Sit to Stand: Maximal Assistance - Patient 25-49% Stand to Sit: Maximal Assistance - Patient 25-49% Squat Pivot Transfers: Moderate Assistance - Patient 50-74% Locomotion  Gait Ambulation: Yes Gait Assistance: Maximal Assistance - Patient 25-49% Gait Distance (Feet): 10 Feet Assistive device: Other (Comment) (rail in hall) Gait Gait: Yes Gait Pattern: Impaired Gait Pattern: Poor foot clearance - right;Lateral hip instability;Right flexed knee in stance;Right steppage;Right hip hike;Right foot flat;Right circumduction Stairs / Additional Locomotion Stairs: No Wheelchair Mobility Wheelchair Mobility: Yes Wheelchair Assistance: Moderate Assistance - Patient 50 - 74% Wheelchair Propulsion: Left upper extremity;Left lower extremity Wheelchair Parts Management: Needs assistance Distance: 150   Discharge Criteria: Patient will be discharged from PT if patient refuses treatment 3 consecutive times without medical reason, if treatment goals not met, if there is a change in medical status, if patient makes no progress towards goals or if patient is discharged from hospital.  The above assessment, treatment plan, treatment alternatives and goals were discussed and mutually agreed upon: by patient and by family  Lorie Phenix 02/22/2021, 6:07 PM

## 2021-02-22 NOTE — Progress Notes (Signed)
PROGRESS NOTE   Subjective/Complaints: Reports no problems last night. Eating breakfast when I saw him. Denies any issues.   ROS: Patient denies fever, rash, sore throat, blurred vision, nausea, vomiting, diarrhea, cough, shortness of breath or chest pain, joint or back pain, headache, or mood change.   Objective:   No results found. Recent Labs    02/20/21 0551 02/21/21 0603  WBC 7.2 7.8  HGB 17.1* 16.6  HCT 50.4 47.7  PLT 123* 130*   Recent Labs    02/20/21 0551 02/21/21 0603  NA 136 137  K 4.2 4.7  CL 101 104  CO2 24 25  GLUCOSE 110* 104*  BUN 12 16  CREATININE 1.40* 1.50*  CALCIUM 9.5 9.3    Intake/Output Summary (Last 24 hours) at 02/22/2021 1303 Last data filed at 02/22/2021 0900 Gross per 24 hour  Intake 360 ml  Output 1150 ml  Net -790 ml        Physical Exam: Vital Signs Blood pressure 118/80, pulse (!) 52, temperature 98.2 F (36.8 C), temperature source Oral, resp. rate 18, weight 63.8 kg, SpO2 92 %.  General: Alert and oriented x 3, No apparent distress HEENT: Head is normocephalic, atraumatic, PERRLA, EOMI, sclera anicteric, oral mucosa pink and moist, dentition intact, ext ear canals clear,  Neck: Supple without JVD or lymphadenopathy Heart: Reg rate and rhythm. No murmurs rubs or gallops Chest: CTA bilaterally without wheezes, rales, or rhonchi; no distress Abdomen: Soft, non-tender, non-distended, bowel sounds positive. Extremities: No clubbing, cyanosis, or edema. Pulses are 2+ Psych: Pt's affect is appropriate. Pt is cooperative Skin: Clean and intact without signs of breakdown Neuro: alert, oriented. Follows basic commands. Feeding self breakfast. Dysarthric but language intact. Left gaze preference. Tending to all items on tray.  LUE and LLE 5/5. RUE 1-2/5, RLE 0/5 prox to distal. Motor apraxia. Extensor tone RLE.   Musculoskeletal: Full ROM, No pain with AROM or PROM in the neck,  trunk, or extremities. Posture appropriate     Assessment/Plan: 1. Functional deficits which require 3+ hours per day of interdisciplinary therapy in a comprehensive inpatient rehab setting.  Physiatrist is providing close team supervision and 24 hour management of active medical problems listed below.  Physiatrist and rehab team continue to assess barriers to discharge/monitor patient progress toward functional and medical goals  Care Tool:  Bathing    Body parts bathed by patient: Front perineal area,Abdomen,Chest,Right upper leg,Left upper leg,Face   Body parts bathed by helper: Left arm,Buttocks,Left lower leg,Right lower leg     Bathing assist Assist Level: Maximal Assistance - Patient 24 - 49%     Upper Body Dressing/Undressing Upper body dressing   What is the patient wearing?: Pull over shirt    Upper body assist Assist Level: Maximal Assistance - Patient 25 - 49%    Lower Body Dressing/Undressing Lower body dressing      What is the patient wearing?: Pants     Lower body assist Assist for lower body dressing: Maximal Assistance - Patient 25 - 49%     Toileting Toileting    Toileting assist Assist for toileting: Maximal Assistance - Patient 25 - 49%     Transfers  Chair/bed transfer  Transfers assist     Chair/bed transfer assist level: Maximal Assistance - Patient 25 - 49%     Locomotion Ambulation   Ambulation assist              Walk 10 feet activity   Assist           Walk 50 feet activity   Assist           Walk 150 feet activity   Assist           Walk 10 feet on uneven surface  activity   Assist           Wheelchair     Assist               Wheelchair 50 feet with 2 turns activity    Assist            Wheelchair 150 feet activity     Assist          Blood pressure 118/80, pulse (!) 52, temperature 98.2 F (36.8 C), temperature source Oral, resp. rate 18, weight 63.8  kg, SpO2 92 %.  Medical Problem List and Plan: 1.  Dysarthria/right hemiparesis with left gaze preference secondary to multifocal acute ischemia within the bilateral, left greater than right multiple vascular territories due to left vertebral artery occlusion.             -patient may shower             -ELOS/Goals: 14-17 days/supervision/min a            -Patient is beginning CIR therapies today including PT, OT, and SLP  2.  Antithrombotics: -DVT/anticoagulation: Pradaxa             -antiplatelet therapy: Aspirin 81 mg daily 3. Pain Management: Hydrocodone as needed 4. Mood: Provide emotional support             -antipsychotic agents: N/A 5. Neuropsych: This patient is capable of making decisions on his own behalf. 6. Skin/Wound Care: Routine skin checks 7. Fluids/Electrolytes/Nutrition: Routine in and outs             CMP ordered for Monday  -appears to have good appetite 8.  Hypertension.  Toprol-XL 50 mg daily.               5/28 bp's controlled at present 9.  Hyperlipidemia: Zocor 10.  History of squamous cell carcinoma of the lung/rectal adenocarcinoma.  Follow-up outpatient 11.  Cardiomyopathy with history of bilateral pulmonary emboli.  Continue Pradaxa 12.  Atrial fibrillation.  Cardiac rate in 50's-60's             Monitor heart rate with increased physical activity 13.  History of tobacco abuse.  Counseling 14.  GERD: Protonix drop with meds as below 15.  Thrombocytopenia             Platelets 130 on 5/27, CBC ordered for monday    LOS: 1 days A FACE TO [facility] 02/22/2021, 1:03 PM

## 2021-02-22 NOTE — Plan of Care (Signed)
Problem: RH Balance Goal: LTG: Patient will maintain dynamic sitting balance (OT) Description: LTG:  Patient will maintain dynamic sitting balance with assistance during activities of daily living (OT) Flowsheets (Taken 02/22/2021 0959) LTG: Pt will maintain dynamic sitting balance during ADLs with: Supervision/Verbal cueing Goal: LTG Patient will maintain dynamic standing with ADLs (OT) Description: LTG:  Patient will maintain dynamic standing balance with assist during activities of daily living (OT)  Flowsheets (Taken 02/22/2021 0959) LTG: Pt will maintain dynamic standing balance during ADLs with: Minimal Assistance - Patient > 75%   Problem: Sit to Stand Goal: LTG:  Patient will perform sit to stand in prep for activites of daily living with assistance level (OT) Description: LTG:  Patient will perform sit to stand in prep for activites of daily living with assistance level (OT) Flowsheets (Taken 02/22/2021 0959) LTG: PT will perform sit to stand in prep for activites of daily living with assistance level: Minimal Assistance - Patient > 75%   Problem: RH Eating Goal: LTG Patient will perform eating w/assist, cues/equip (OT) Description: LTG: Patient will perform eating with assist, with/without cues using equipment (OT) Flowsheets (Taken 02/22/2021 0959) LTG: Pt will perform eating with assistance level of: Independent with assistive device    Problem: RH Grooming Goal: LTG Patient will perform grooming w/assist,cues/equip (OT) Description: LTG: Patient will perform grooming with assist, with/without cues using equipment (OT) Flowsheets (Taken 02/22/2021 0959) LTG: Pt will perform grooming with assistance level of: Supervision/Verbal cueing   Problem: RH Bathing Goal: LTG Patient will bathe all body parts with assist levels (OT) Description: LTG: Patient will bathe all body parts with assist levels (OT) Flowsheets (Taken 02/22/2021 0959) LTG: Pt will perform bathing with assistance  level/cueing: Minimal Assistance - Patient > 75%   Problem: RH Dressing Goal: LTG Patient will perform upper body dressing (OT) Description: LTG Patient will perform upper body dressing with assist, with/without cues (OT). Flowsheets (Taken 02/22/2021 0959) LTG: Pt will perform upper body dressing with assistance level of: Supervision/Verbal cueing Goal: LTG Patient will perform lower body dressing w/assist (OT) Description: LTG: Patient will perform lower body dressing with assist, with/without cues in positioning using equipment (OT) Flowsheets (Taken 02/22/2021 0959) LTG: Pt will perform lower body dressing with assistance level of: Minimal Assistance - Patient > 75%   Problem: RH Toileting Goal: LTG Patient will perform toileting task (3/3 steps) with assistance level (OT) Description: LTG: Patient will perform toileting task (3/3 steps) with assistance level (OT)  Flowsheets (Taken 02/22/2021 0959) LTG: Pt will perform toileting task (3/3 steps) with assistance level: Minimal Assistance - Patient > 75%   Problem: RH Functional Use of Upper Extremity Goal: LTG Patient will use RT/LT upper extremity as a (OT) Description: LTG: Patient will use right/left upper extremity as a stabilizer/gross assist/diminished/nondominant/dominant level with assist, with/without cues during functional activity (OT) Flowsheets (Taken 02/22/2021 0959) LTG: Use of upper extremity in functional activities: RUE as a stabilizer LTG: Pt will use upper extremity in functional activity with assistance level of: Moderate Assistance - Patient 50 - 74%   Problem: RH Toilet Transfers Goal: LTG Patient will perform toilet transfers w/assist (OT) Description: LTG: Patient will perform toilet transfers with assist, with/without cues using equipment (OT) Flowsheets (Taken 02/22/2021 0959) LTG: Pt will perform toilet transfers with assistance level of: Minimal Assistance - Patient > 75%   Problem: RH Tub/Shower  Transfers Goal: LTG Patient will perform tub/shower transfers w/assist (OT) Description: LTG: Patient will perform tub/shower transfers with assist, with/without cues using equipment (  OT) Flowsheets (Taken 02/22/2021 0959) LTG: Pt will perform tub/shower stall transfers with assistance level of: Minimal Assistance - Patient > 75%

## 2021-02-22 NOTE — Evaluation (Signed)
Occupational Therapy Assessment and Plan  Patient Details  Name: Juan Wheeler MRN: [id number] Date of Birth: Oct 02, 1960  OT Diagnosis: abnormal posture, cognitive deficits, flaccid hemiplegia and hemiparesis, hemiplegia affecting dominant side, muscle weakness (generalized) and decreased postural control, L gaze preference, mild R inattention impairing ADL/func mobility Rehab Potential: Rehab Potential (ACUTE ONLY): Good ELOS: 16 to 18 days   Today's Date: 02/22/2021 OT Individual Time: 0865-7846 OT Individual Time Calculation (min): 47 min     Hospital Problem: Principal Problem:   Acute cerebral infarction [facility]) Active Problems:   Ischemic cerebrovascular accident (CVA) of frontal lobe ([place])   Past Medical History:  Past Medical History:  Diagnosis Date  . Chronic lower back pain    a. Followed by pain management at [facility].  . Colon cancer [facility])    rectal cancer  . Coronary artery disease    a. 03/2013: abnl nuc -> LHC s/p DES to LCx, residual moderate disease in LAD (med rx unless refractory angina). b. Not on BB due to bradycardia.  Marland Kitchen DVT (deep venous thrombosis) ([place]) ~ 2013  . Dysrhythmia    AFib  . GERD (gastroesophageal reflux disease)   . H/O necrotising fasciitis   . History of blood transfusion    "once; after throwing up alot of blood" (04/17/2013)  . Hypertension   . LV dysfunction    a. EF 45% in 03/2013.  Marland Kitchen PAD (peripheral artery disease) ([place])    a. Occlusion of the right internal iliac artery, with significant atherosclerosis in the left internal iliac which was not amenable to reconstruction per notes from [place] in place 05/30/2020  . Pulmonary nodules 09/30/2014  . Rectal cancer ([place])   . Tobacco abuse    Past Surgical History:  Past Surgical History:  Procedure Laterality Date  . ABDOMINAL SURGERY  1990's   'for stomach ulcers" (04/17/2013)  . COLECTOMY  2012   "for rectal cancer" (04/17/2013)  . COLONOSCOPY  2013   Dr.  Cheryll Cockayne: colorectal anastomosis with ulcer and inflammation, benign biopsy  . COLONOSCOPY WITH PROPOFOL N/A 09/07/2016   Procedure: COLONOSCOPY WITH PROPOFOL;  Surgeon: Daneil Dolin, MD;  [facility];  Service: Endoscopy;  Laterality: N/A;  10:00 am Colonoscopy via rectum and ostomy  . COLOSTOMY TAKEDOWN  2013  . CORONARY ANGIOPLASTY WITH STENT PLACEMENT  04/17/2013   "?1" (04/17/2013)  . FEMORAL-POPLITEAL BYPASS GRAFT Left 07/02/2015   Procedure: BYPASS GRAFT LEFT COMMON FEMORAL ARTERY TO LEFT ABOVE KNEE POPLITEAL ARTERY - USING LEFT GREATER SAPPHENOUS VEIN;  Surgeon: Elam Dutch, MD;  Location: [place];  Service: Vascular;  Laterality: Left;  . INCISION AND DRAINAGE ABSCESS Left 06/05/2016   Procedure: INCISION AND DRAINAGE ABSCESS;  Surgeon: Aviva Signs, MD;  Location: [facility] ORS;  Service: General;  Laterality: Left;  . INCISION AND DRAINAGE PERIRECTAL ABSCESS Left 06/07/2016   Procedure: IRRIGATION AND DEBRIDEMENT LEFT BUTTOCK ABSCESS;  Surgeon: Greer Pickerel, MD;  Location: [place];  Service: General;  Laterality: Left;  . INGUINAL HERNIA REPAIR Bilateral 1990's  . LAPAROSCOPIC PARTIAL COLECTOMY N/A 06/11/2016   Procedure: LAPAROSCOPIC  OPEN COLOSTOMY;  Surgeon: [name], MD;  Location: [place];  Service: General;  Laterality: N/A;  . LEFT HEART CATHETERIZATION WITH CORONARY ANGIOGRAM N/A 04/17/2013   Procedure: LEFT HEART CATHETERIZATION WITH CORONARY ANGIOGRAM;  Surgeon: Peter M Martinique, MD;  Location: [facility] CATH LAB;  Service: Cardiovascular;  Laterality: N/A;  . PERCUTANEOUS STENT INTERVENTION  04/17/2013   Procedure:  PERCUTANEOUS STENT INTERVENTION;  Surgeon: Peter M Martinique, MD;  Location: [facility] CATH LAB;  Service: Cardiovascular;;  . PERIPHERAL VASCULAR CATHETERIZATION N/A 06/14/2015   Procedure: Abdominal Aortogram;  Surgeon: Elam Dutch, MD;  Location: [place] CV LAB;  Service: Cardiovascular;  Laterality: N/A;  . PERIPHERAL VASCULAR CATHETERIZATION Bilateral  06/14/2015   Procedure: Lower Extremity Angiography;  Surgeon: Elam Dutch, MD;  Location: [place] CV LAB;  Service: Cardiovascular;  Laterality: Bilateral;  . PORTACATH PLACEMENT Left 06/05/2020   Procedure: INSERTION PORT-A-CATH;  Surgeon: Aviva Signs, MD;  Location: [facility];  Service: General;  Laterality: Left;  Marland Kitchen VEIN HARVEST Left 07/02/2015   Procedure: VEIN HARVEST - LEFT GREATER SAPPHENOUS VEIN;  Surgeon: Elam Dutch, MD;  Location: [facility] OR;  Service: Vascular;  Laterality: Left;    Assessment & Plan Clinical Impression: Patient is a 60 y.o. year old male with history of squamous cell carcinoma of the lung, rectal adenocarcinoma, neuropathy, cardiomyopathy with ejection fraction of 30 to 35%, bilateral pulmonary emboli on Eliquis, atrial fibrillation, CAD/PAD, hypertension, tobacco abuse, chronic back pain followed at [facility].  History taken from chart review and patient.  Patient lives with spouse and independent prior to admission.  1 level home 3-4 steps to entry.  He presented on 5/29 with weakness, dysarthria, and left gaze preference.  CT/MRI as well as MRA showed multifocal acute ischemia within bilateral left greater than right multiple vascular territories.  The largest area of abnormality at the posterior left insula.  Pattern most suggestive of embolic process.  Occlusion of left vertebral artery with distal reconstitution due to flow across the vertebrobasilar confluence.  Normal intracranial MRA.  MRI cervical spine showed mild spinal canal stenosis and bilateral neuroforaminal stenosis C5-6.  Mild left C2-3 3-4 and C6-7 neuroforaminal stenosis.  Admission chemistries unremarkable except urine drug screen positive for marijuana.  Recent echocardiogram with ejection fraction of 30-35%, global hypokinesis.  Neurology follow-up currently maintained on low-dose aspirin as well as Pradaxa for CVA prophylaxis.  Tolerating a regular diet.  Due to patient's right  hemiparesis, dysarthria, and cognitive dysfunction was admitted for a comprehensive rehab program.   Patient transferred to CIR on 02/21/2021 .    Patient currently requires max with basic self-care skills secondary to muscle weakness, decreased cardiorespiratoy endurance, impaired timing and sequencing, abnormal tone, unbalanced muscle activation, motor apraxia, decreased coordination and decreased motor planning, decreased midline orientation and decreased attention to right, decreased awareness, decreased problem solving, decreased safety awareness and delayed processing and decreased sitting balance, decreased standing balance, decreased postural control and hemiplegia.  Prior to hospitalization, patient could complete BADL/IADL with independent .  Patient will benefit from skilled intervention to decrease level of assist with basic self-care skills and increase independence with basic self-care skills prior to discharge home with care partner.  Anticipate patient will require 24 hour supervision and minimal physical assistance and follow up home health.  OT - End of Session Activity Tolerance: Tolerates 10 - 20 min activity with multiple rests Endurance Deficit: Yes Endurance Deficit Description: Reports significant fatigue after seated ADL OT Assessment Rehab Potential (ACUTE ONLY): Good OT Patient demonstrates impairments in the following area(s): Balance;Motor;Sensory;Skin Integrity;Pain;Cognition;Endurance;Safety;Perception;Vision OT Basic ADL's Functional Problem(s): Eating;Grooming;Bathing;Dressing;Toileting OT Transfers Functional Problem(s): Toilet;Tub/Shower OT Additional Impairment(s): Fuctional Use of Upper Extremity OT Plan OT Intensity: Minimum of 1-2 x/day, 45 to 90 minutes OT Frequency: 5 out of 7 days OT Duration/Estimated Length of Stay: 16 to 18 days OT Treatment/Interventions: Balance/vestibular training;Community reintegration;Disease  mangement/prevention;Functional  electrical stimulation;Neuromuscular re-education;Patient/family education;Self Care/advanced ADL retraining;Splinting/orthotics;Therapeutic Exercise;UE/LE Coordination activities;Wheelchair propulsion/positioning;Visual/perceptual remediation/compensation;UE/LE Strength taining/ROM;Therapeutic Activities;Skin care/wound managment;Psychosocial support;Pain management;Functional mobility training;DME/adaptive equipment instruction;Discharge planning;Cognitive remediation/compensation OT Self Feeding Anticipated Outcome(s): S OT Basic Self-Care Anticipated Outcome(s): min A OT Toileting Anticipated Outcome(s): min A OT Bathroom Transfers Anticipated Outcome(s): min A OT Recommendation Patient destination: Home Follow Up Recommendations: Home health OT Equipment Recommended: To be determined   OT Evaluation Precautions/Restrictions  Precautions Precautions: Fall Precaution Comments: dense R hemi, dysarthria, colostomy Restrictions Weight Bearing Restrictions: No General Chart Reviewed: Yes Family/Caregiver Present: Yes Pain Pain Assessment Pain Scale: Faces Pain Score: 7  Faces Pain Scale: Hurts a little bit Pain Type: Chronic pain Pain Location: Back Pain Orientation: Mid Pain Descriptors / Indicators: Aching Pain Frequency: Constant Pain Onset: On-going Patients Stated Pain Goal: 2 Pain Intervention(s): Medication (See eMAR) Home Living/Prior [place] expects to be discharged to:: Private residence Living Arrangements: Spouse/significant other Available Help at Discharge: Family,Available PRN/intermittently (wife works during day but plans to stay home 24/7 upon DC) Type of Home: Mobile home Home Access: Stairs to enter Technical brewer of Steps: 3-4 Entrance Stairs-Rails: Right,Left Home Layout: One level Bathroom Shower/Tub: Engineer, petroleum: Standard Bathroom Accessibility: Yes Additional Comments: unsure if  WC would fit.  Lives With: Spouse,Family IADL History Homemaking Responsibilities: Yes Education: high school Occupation: On disability Leisure and Hobbies: "hanging out with friends" Prior Function Level of Independence: Independent with basic ADLs,Independent with homemaking with ambulation,Independent with gait  Able to Take Stairs?: Yes Driving: No Vocation: On disability Comments: I with ADLs/IADLs without AD; drives; shared responsibility with wife for IADLs Vision Baseline Vision/History: No visual deficits Patient Visual Report: No change from baseline Vision Assessment?: Yes Ocular Range of Motion: Restricted on the right Alignment/Gaze Preference: Head tilt;Gaze left Tracking/Visual Pursuits: Decreased smoothness of vertical tracking;Decreased smoothness of horizontal tracking Saccades: Additional head turns occurred during testing;Decreased speed of saccadic movement Convergence: Impaired (comment) Visual Fields: Right visual field deficit;Impaired-to be further tested in functional context Perception  Perception: Impaired Inattention/Neglect: Does not attend to right side of body Praxis Praxis: Impaired Praxis Impairment Details: Motor planning Cognition Overall Cognitive Status: Impaired/Different from baseline Arousal/Alertness: Awake/alert Orientation Level: Person;Place;Situation Person: Oriented Place: Oriented Situation: Oriented Year: 2022 Month: May Day of Week: Incorrect Immediate Memory Recall: Sock;Blue;Bed Memory Recall Sock: Without Cue Memory Recall Blue: Without Cue Memory Recall Bed: Without Cue Attention: Sustained Sustained Attention Impairment: Verbal complex;Functional complex Problem Solving: Impaired Safety/Judgment: Impaired Sensation Sensation Light Touch: Impaired by gross assessment (unable to appreciate LT to R digits/toes) Light Touch Impaired Details: Impaired RUE;Impaired RLE Hot/Cold: Not tested Proprioception: Impaired by  gross assessment Stereognosis: Impaired by gross assessment Coordination Gross Motor Movements are Fluid and Coordinated: No Fine Motor Movements are Fluid and Coordinated: No Coordination and Movement Description: dense R hemi, head tilt L Finger Nose Finger Test: unable to complete R side Motor  Motor Motor: Hemiplegia;Abnormal tone;Abnormal postural alignment and control Motor - Skilled Clinical Observations: dense R hemi, kyphotic, L head tilt  Trunk/Postural Assessment  Cervical Assessment Cervical Assessment: Exceptions to [facility] (L head tilt, forward head) Thoracic Assessment Thoracic Assessment: Exceptions to [facility] (increased kyphosis) Lumbar Assessment Lumbar Assessment: Exceptions to [facility] (posterior pelvic tilt) Postural Control Postural Control: Deficits on evaluation  Balance Balance Balance Assessed: Yes Static Sitting Balance Static Sitting - Balance Support: Feet supported Static Sitting - Level of Assistance: 3: Mod assist;4: Min assist Dynamic Sitting Balance Dynamic Sitting - Balance Support: Feet supported Dynamic  Sitting - Level of Assistance: 3: Mod assist Static Standing Balance Static Standing - Balance Support: No upper extremity supported Static Standing - Level of Assistance: 2: Max assist Extremity/Trunk Assessment RUE Assessment RUE Assessment: Exceptions to [facility] Strength Comments: 0/5 proximal to distal RUE Body System: Neuro Brunstrum levels for arm and hand: Arm;Hand Brunstrum level for arm: Stage I Presynergy Brunstrum level for hand: Stage I Flaccidity LUE Assessment LUE Assessment: Within Functional Limits  Care Tool Care Tool Self Care Eating   Eating Assist Level: Supervision/Verbal cueing    Oral Care    Oral Care Assist Level: Supervision/Verbal cueing    Bathing   Body parts bathed by patient: Front perineal area;Abdomen;Chest;Right upper leg;Left upper leg;Face Body parts bathed by helper: Left arm;Buttocks;Left lower  leg;Right lower leg   Assist Level: Maximal Assistance - Patient 24 - 49%    Upper Body Dressing(including orthotics)   What is the patient wearing?: Pull over shirt   Assist Level: Maximal Assistance - Patient 25 - 49%    Lower Body Dressing (excluding footwear)   What is the patient wearing?: Pants Assist for lower body dressing: Maximal Assistance - Patient 25 - 49%    Putting on/Taking off footwear   What is the patient wearing?: Non-skid slipper socks Assist for footwear: Total Assistance - Patient < 25%       Care Tool Toileting Toileting activity   Assist for toileting: Maximal Assistance - Patient 25 - 49%     Care Tool Bed Mobility Roll left and right activity   Roll left and right assist level: Moderate Assistance - Patient 50 - 74%    Sit to lying activity   Sit to lying assist level: Moderate Assistance - Patient 50 - 74%    Lying to sitting edge of bed activity   Lying to sitting edge of bed assist level: Moderate Assistance - Patient 50 - 74%     Care Tool Transfers Sit to stand transfer   Sit to stand assist level: Maximal Assistance - Patient 25 - 49%    Chair/bed transfer   Chair/bed transfer assist level: Maximal Assistance - Patient 25 - 49%     Toilet transfer   Assist Level: Maximal Assistance - Patient 24 - 49%     Care Tool Cognition Expression of Ideas and Wants Expression of Ideas and Wants: Some difficulty - exhibits some difficulty with expressing needs and ideas (e.g, some words or finishing thoughts) or speech is not clear   Understanding Verbal and Non-Verbal Content Understanding Verbal and Non-Verbal Content: Usually understands - understands most conversations, but misses some part/intent of message. Requires cues at times to understand   Memory/Recall Ability *first 3 days only Memory/Recall Ability *first 3 days only: That he or she is in a hospital/hospital unit;Current season    Refer to Care Plan for Long Term Goals  SHORT  TERM GOAL WEEK 1 OT Short Term Goal 1 (Week 1): Pt will don shirt with mod A via hemi-technique. OT Short Term Goal 2 (Week 1): Pt will maintain static sitting balance unsupported with close S for >5 min. OT Short Term Goal 3 (Week 1): Pt will complete STS with mod A in prep for standing ADL.  Recommendations for other services: None    Skilled Therapeutic Intervention ADL ADL Eating: Supervision/safety Where Assessed-Eating: Bed level;Wheelchair Grooming: Supervision/safety Where Assessed-Grooming: Sitting at sink Upper Body Bathing: Moderate assistance Where Assessed-Upper Body Bathing: Wheelchair;Sitting at sink Lower Body Bathing: Maximal assistance Where Assessed-Lower  Body Bathing: Sitting at sink;Standing at sink Upper Body Dressing: Maximal assistance Where Assessed-Upper Body Dressing: Sitting at sink Lower Body Dressing: Maximal assistance Where Assessed-Lower Body Dressing: Standing at sink Toileting: Not assessed Toilet Transfer: Not assessed Tub/Shower Transfer: Not assessed Walk-In Shower Transfer: Not assessed Mobility  Bed Mobility Bed Mobility: Rolling Right;Right Sidelying to Sit;Supine to Sit Rolling Right: Moderate Assistance - Patient 50-74% Right Sidelying to Sit: Moderate Assistance - Patient 50-74% Supine to Sit: Moderate Assistance - Patient 50-74% Transfers Sit to Stand: Maximal Assistance - Patient 25-49% Stand to Sit: Maximal Assistance - Patient 25-49%  Session Note: Pt received semi-reclined in bed, c/o back pain but agreeable to OT eval. Reviewed role of CIR OT, evaluation process, ADL/func mobility retraining, goals for therapy, and safety plan. Evaluation completed as documented above with session focus on func mobility, sink side bathing, dressing, and grooming. Edu with pt and wife on RUE therapeutic positioning and safety 2/2 decreased sensation. Pt came to sitting EOB with mod A to progress BLE off bed + lift trunk. Initially req mod A to  maintain static sitting balance 2/2 R / posterior lean. With time + feet supported ong round, able to maintain with CGA. STS x2 with max to mod A to block R knee and maintain upright posture. Squat-pivot to pt's L side with mod A to block R knee. Doffed hospital gown min A to remove RUE/untie. Donned new shirt and pants with max A. Pt does demonstrate good initiation of ADL tasks, but declines bathing at this time. Overall demonstrates flat affect throughout session but appropriately engages/answers questions. Able to state he is in the hospital because of a stroke and mentions his "Right side" when asked how it has affected him. Completed oral care seated with S and set-up of tooth brush. Squat-pivot back to bed same manner as above, returned to supine mod A to progress BLE onto bed. Wife present at end of session and confirmed PLOF and home set-up. Pt left semi-reclined in bed, hemi side protected, call bell in reach, and all immediate needs met.    Discharge Criteria: Patient will be discharged from OT if patient refuses treatment 3 consecutive times without medical reason, if treatment goals not met, if there is a change in medical status, if patient makes no progress towards goals or if patient is discharged from hospital.  The above assessment, treatment plan, treatment alternatives and goals were discussed and mutually agreed upon: by patient and by family  Volanda Napoleon MS, OTR/L  02/22/2021, 9:57 AM

## 2021-02-22 NOTE — Progress Notes (Signed)
Inpatient Rehabilitation  Patient information reviewed and entered into eRehab system by Narcissa Melder M. Kasmira Cacioppo, M.A., CCC/SLP, PPS Coordinator.  Information including medical coding, functional ability and quality indicators will be reviewed and updated through discharge.    

## 2021-02-22 NOTE — Plan of Care (Signed)
  Problem: RH Balance Goal: LTG Patient will maintain dynamic sitting balance (PT) Description: LTG:  Patient will maintain dynamic sitting balance with assistance during mobility activities (PT) Flowsheets (Taken 02/22/2021 1801) LTG: Pt will maintain dynamic sitting balance during mobility activities with:: Supervision/Verbal cueing Goal: LTG Patient will maintain dynamic standing balance (PT) Description: LTG:  Patient will maintain dynamic standing balance with assistance during mobility activities (PT) Flowsheets (Taken 02/22/2021 1801) LTG: Pt will maintain dynamic standing balance during mobility activities with:: Minimal Assistance - Patient > 75%   Problem: Sit to Stand Goal: LTG:  Patient will perform sit to stand with assistance level (PT) Description: LTG:  Patient will perform sit to stand with assistance level (PT) Flowsheets (Taken 02/22/2021 1801) LTG: PT will perform sit to stand in preparation for functional mobility with assistance level: Minimal Assistance - Patient > 75%   Problem: RH Bed Mobility Goal: LTG Patient will perform bed mobility with assist (PT) Description: LTG: Patient will perform bed mobility with assistance, with/without cues (PT). Flowsheets (Taken 02/22/2021 1801) LTG: Pt will perform bed mobility with assistance level of: Minimal Assistance - Patient > 75%   Problem: RH Bed to Chair Transfers Goal: LTG Patient will perform bed/chair transfers w/assist (PT) Description: LTG: Patient will perform bed to chair transfers with assistance (PT). Flowsheets (Taken 02/22/2021 1801) LTG: Pt will perform Bed to Chair Transfers with assistance level: Minimal Assistance - Patient > 75%   Problem: RH Car Transfers Goal: LTG Patient will perform car transfers with assist (PT) Description: LTG: Patient will perform car transfers with assistance (PT). Flowsheets (Taken 02/22/2021 1801) LTG: Pt will perform car transfers with assist:: Moderate Assistance - Patient 50  - 74%   Problem: RH Ambulation Goal: LTG Patient will ambulate in controlled environment (PT) Description: LTG: Patient will ambulate in a controlled environment, # of feet with assistance (PT). Flowsheets (Taken 02/22/2021 1801) LTG: Pt will ambulate in controlled environ  assist needed:: Minimal Assistance - Patient > 75% LTG: Ambulation distance in controlled environment: 59ft with LRAD Goal: LTG Patient will ambulate in home environment (PT) Description: LTG: Patient will ambulate in home environment, # of feet with assistance (PT). Flowsheets (Taken 02/22/2021 1801) LTG: Pt will ambulate in home environ  assist needed:: Minimal Assistance - Patient > 75% LTG: Ambulation distance in home environment: 25 ft with LRAD   Problem: RH Wheelchair Mobility Goal: LTG Patient will propel w/c in controlled environment (PT) Description: LTG: Patient will propel wheelchair in controlled environment, # of feet with assist (PT) Flowsheets (Taken 02/22/2021 1801) LTG: Pt will propel w/c in controlled environ  assist needed:: Supervision/Verbal cueing LTG: Propel w/c distance in controlled environment: 184ft Goal: LTG Patient will propel w/c in home environment (PT) Description: LTG: Patient will propel wheelchair in home environment, # of feet with assistance (PT). Flowsheets (Taken 02/22/2021 1801) LTG: Pt will propel w/c in home environ  assist needed:: Supervision/Verbal cueing Distance: wheelchair distance in controlled environment: 50   Problem: RH Stairs Goal: LTG Patient will ambulate up and down stairs w/assist (PT) Description: LTG: Patient will ambulate up and down # of stairs with assistance (PT) Flowsheets (Taken 02/22/2021 1801) LTG: Pt will ambulate up/down stairs assist needed:: Moderate Assistance - Patient 50 - 74% LTG: Pt will  ambulate up and down number of stairs: 4 steps with UE support

## 2021-02-22 NOTE — Evaluation (Signed)
Speech Language Pathology Assessment and Plan  Patient Details  Name: Juan Wheeler MRN: [id number] Date of Birth: Mar 02, 1961  SLP Diagnosis: Dysarthria;Cognitive Impairments;Dysphagia  Rehab Potential: Good ELOS: 14-17 days   Today's Date: 02/22/2021 SLP Individual Time: 1300-1400 SLP Individual Time Calculation (min): 60 min  Hospital Problem: Principal Problem:   Acute cerebral infarction [facility]) Active Problems:   Ischemic cerebrovascular accident (CVA) of frontal lobe ([place])  Past Medical History:  Past Medical History:  Diagnosis Date  . Chronic lower back pain    a. Followed by pain management at [facility].  . Colon cancer [facility])    rectal cancer  . Coronary artery disease    a. 03/2013: abnl nuc -> LHC s/p DES to LCx, residual moderate disease in LAD (med rx unless refractory angina). b. Not on BB due to bradycardia.  Marland Kitchen DVT (deep venous thrombosis) ([place]) ~ 2013  . Dysrhythmia    AFib  . GERD (gastroesophageal reflux disease)   . H/O necrotising fasciitis   . History of blood transfusion    "once; after throwing up alot of blood" (04/17/2013)  . Hypertension   . LV dysfunction    a. EF 45% in 03/2013.  Marland Kitchen PAD (peripheral artery disease) ([place])    a. Occlusion of the right internal iliac artery, with significant atherosclerosis in the left internal iliac which was not amenable to reconstruction per notes from [place] in place 05/30/2020  . Pulmonary nodules 09/30/2014  . Rectal cancer ([place])   . Tobacco abuse    Past Surgical History:  Past Surgical History:  Procedure Laterality Date  . ABDOMINAL SURGERY  1990's   'for stomach ulcers" (04/17/2013)  . COLECTOMY  2012   "for rectal cancer" (04/17/2013)  . COLONOSCOPY  2013   Dr. Cheryll Cockayne: colorectal anastomosis with ulcer and inflammation, benign biopsy  . COLONOSCOPY WITH PROPOFOL N/A 09/07/2016   Procedure: COLONOSCOPY WITH PROPOFOL;  Surgeon: Daneil Dolin, MD;  [facility];   Service: Endoscopy;  Laterality: N/A;  10:00 am Colonoscopy via rectum and ostomy  . COLOSTOMY TAKEDOWN  2013  . CORONARY ANGIOPLASTY WITH STENT PLACEMENT  04/17/2013   "?1" (04/17/2013)  . FEMORAL-POPLITEAL BYPASS GRAFT Left 07/02/2015   Procedure: BYPASS GRAFT LEFT COMMON FEMORAL ARTERY TO LEFT ABOVE KNEE POPLITEAL ARTERY - USING LEFT GREATER SAPPHENOUS VEIN;  Surgeon: Elam Dutch, MD;  Location: [place];  Service: Vascular;  Laterality: Left;  . INCISION AND DRAINAGE ABSCESS Left 06/05/2016   Procedure: INCISION AND DRAINAGE ABSCESS;  Surgeon: Aviva Signs, MD;  Location: [facility] ORS;  Service: General;  Laterality: Left;  . INCISION AND DRAINAGE PERIRECTAL ABSCESS Left 06/07/2016   Procedure: IRRIGATION AND DEBRIDEMENT LEFT BUTTOCK ABSCESS;  Surgeon: Greer Pickerel, MD;  Location: [place];  Service: General;  Laterality: Left;  . INGUINAL HERNIA REPAIR Bilateral 1990's  . LAPAROSCOPIC PARTIAL COLECTOMY N/A 06/11/2016   Procedure: LAPAROSCOPIC  OPEN COLOSTOMY;  Surgeon: [name], MD;  Location: [place];  Service: General;  Laterality: N/A;  . LEFT HEART CATHETERIZATION WITH CORONARY ANGIOGRAM N/A 04/17/2013   Procedure: LEFT HEART CATHETERIZATION WITH CORONARY ANGIOGRAM;  Surgeon: Peter M Martinique, MD;  Location[facility] CATH LAB;  Service: Cardiovascular;  Laterality: N/A;  . PERCUTANEOUS STENT INTERVENTION  04/17/2013   Procedure: PERCUTANEOUS STENT INTERVENTION;  Surgeon: Peter M Martinique, MD;  Location: [facility] CATH LAB;  Service: Cardiovascular;;  . PERIPHERAL VASCULAR CATHETERIZATION N/A 06/14/2015   Procedure: Abdominal Aortogram;  Surgeon: Elam Dutch,  MD;  Location: [place] CV LAB;  Service: Cardiovascular;  Laterality: N/A;  . PERIPHERAL VASCULAR CATHETERIZATION Bilateral 06/14/2015   Procedure: Lower Extremity Angiography;  Surgeon: Elam Dutch, MD;  Location: [place] CV LAB;  Service: Cardiovascular;  Laterality: Bilateral;  . PORTACATH PLACEMENT Left 06/05/2020   Procedure: INSERTION  PORT-A-CATH;  Surgeon: Aviva Signs, MD;  Location: [facility];  Service: General;  Laterality: Left;  Marland Kitchen VEIN HARVEST Left 07/02/2015   Procedure: VEIN HARVEST - LEFT GREATER SAPPHENOUS VEIN;  Surgeon: Elam Dutch, MD;  Location: [facility] OR;  Service: Vascular;  Laterality: Left;    Assessment / Plan / Recommendation Clinical Impression Patient is a 60 year old right-handed male with history of squamous cell carcinoma of the lung, rectal adenocarcinoma, neuropathy, cardiomyopathy with ejection fraction of 30 to 35%, bilateral pulmonary emboli on Eliquis, atrial fibrillation, CAD/PAD, hypertension, tobacco abuse, chronic back pain followed at [facility]. History taken from chart review and patient. Patient lives with spouse and independent prior to admission. 1 level home 3-4 steps to entry. He presented on 5/29 with weakness, dysarthria, and left gaze preference. CT/MRI as well as MRA showed multifocal acute ischemia within bilateral left greater than right multiple vascular territories. The largest area of abnormality at the posterior left insula. Pattern most suggestive of embolic process. Occlusion of left vertebral artery with distal reconstitution due to flow across the vertebrobasilar confluence. Normal intracranial MRA. MRI cervical spine showed mild spinal canal stenosis and bilateral neuroforaminal stenosis C5-6. Mild left C2-3 3-4 and C6-7 neuroforaminal stenosis. Admission chemistries unremarkable except urine drug screen positive for marijuana. Recent echocardiogram with ejection fraction of 30-35%, global hypokinesis. Neurology follow-up currently maintained on low-dose aspirin as well as Pradaxa for CVA prophylaxis. Tolerating a regular diet. Due to patient's right hemiparesis, dysarthria, and cognitive dysfunction was admitted for a comprehensive rehab program.   Patient transferred to CIR on 02/21/2021 .   Pt presents with mild-mod cognitive impairment as evidenced by  SLUMS score 18/30 (WFL = 27+). Pt is HOH which at times impacted ability to follow directions during assessment. Pt oriented x4 but demonstrates decreased insight in current cognitive deficits, reporting no change since CVA. Pt with notable deficits in verbal fluency, recall, working memory. problem solving and executive function. Pt able to recall 4/4 story elements and complete basic mathematics during assessment. Pt is responsible for money and med management at home, primarily utilizing cash and states he takes medication directly from pill bottle.   Pt presents with mild oropharyngeal dysphagia characterized by decreased bolus awareness/prep/transit, mild R anterior loss of solids, R pocketing of solids and c/o globus sensation and needing to "cough things up" with all intake. Pt states he is preferring puree or very soft solids d/t feeling like food is stuck in throat. Suspect pharyngeal weakness and residue with solids. Pt with no overt s/s aspiration with thin liquids or puree solids, pt with voluntary weak cough following regular solid trials. Pt appears anxious with advanced solid textures at this time, requests diet downgrade. Pt and wife educated on standard swallow precautions and pt encouraged to utilize effortful swallow and alternating solids and liquids at 1:1 ratio. Pt diet downgraded to Dys 2, remain thin liquids.  Pt with mild dysarthria characterized by reduced articulatory precision and decreased vocal intensity. Pt able to recall 1/4 compensatory strategies as trained in Acute care, benefits from visual aid and mod cues to utilize throughout tx session. Intelligibility at phrase/sentence level ~60%. Pt will benefit from skilled ST  services for speech, swallowing and cognition to maximize safety and independence with daily routine and swallow function in current and d/c environment.    Skilled Therapeutic Interventions          Pt participating in Bedside Swallow Evaluation, [place]room Mental Status Examination (SLUMS) and other non-standardized assessments of speech, language and cognition. Please see above.   SLP Assessment  Patient will need skilled Speech Lanaguage Pathology Services during CIR admission    Recommendations  SLP Diet Recommendations: Dysphagia 2 (Fine chop);Thin Liquid Administration via: Cup;Straw Medication Administration: Crushed with puree Supervision: Patient able to self feed Compensations: Minimize environmental distractions;Slow rate;Small sips/bites;Lingual sweep for clearance of pocketing;Follow solids with liquid;Effortful swallow Postural Changes and/or Swallow Maneuvers: Seated upright 90 degrees Oral Care Recommendations: Oral care BID Recommendations for Other Services: Neuropsych consult Patient destination: Home Follow up Recommendations: Home Health SLP Equipment Recommended: None recommended by SLP    SLP Frequency 3 to 5 out of 7 days   SLP Duration  SLP Intensity  SLP Treatment/Interventions 14-17 days  Minumum of 1-2 x/day, 30 to 90 minutes  Cognitive remediation/compensation;Speech/Language facilitation;Therapeutic Activities;Functional tasks;Cueing hierarchy;Therapeutic Exercise;Internal/external aids;Dysphagia/aspiration precaution training;Medication managment;Patient/family education    Pain Pain Assessment Pain Scale: 0-10 Pain Score: 7  Faces Pain Scale: Hurts a little bit Pain Type: Chronic pain Pain Location: Back  Prior Functioning Cognitive/Linguistic Baseline: Within functional limits Type of Home: Mobile home  Lives With: Spouse;Family Available Help at Discharge: Family;Available PRN/intermittently (wife works but plans to be home at d/c) Education: GED Vocation: On disability  SLP Evaluation Cognition Overall Cognitive Status: Impaired/Different from baseline Arousal/Alertness: Awake/alert Orientation Level: Oriented X4 Attention: Sustained Focused Attention: Impaired Focused  Attention Impairment: Verbal complex;Functional complex Sustained Attention: Impaired Sustained Attention Impairment: Verbal complex;Functional complex Memory: Impaired Memory Impairment: Decreased recall of new information;Decreased short term memory Immediate Memory Recall: Sock;Blue;Bed Memory Recall Sock: Without Cue Memory Recall Blue: Without Cue Memory Recall Bed: Without Cue Awareness: Impaired Awareness Impairment: Emergent impairment Problem Solving: Impaired Problem Solving Impairment: Verbal complex;Functional complex Executive Function: Organizing;Self Monitoring Organizing: Impaired Organizing Impairment: Verbal basic Self Monitoring: Impaired Self Monitoring Impairment: Verbal complex Safety/Judgment: Impaired  Comprehension Auditory Comprehension Overall Auditory Comprehension: Appears within functional limits for tasks assessed Interfering Components: Hearing EffectiveTechniques: Extra processing time;Increased volume;Slowed speech;Repetition Expression Expression Primary Mode of Expression: Verbal Verbal Expression Overall Verbal Expression: Appears within functional limits for tasks assessed Initiation: No impairment Written Expression Dominant Hand: Right Oral Motor Oral Motor/Sensory Function Overall Oral Motor/Sensory Function: Within functional limits Motor Speech Overall Motor Speech: Impaired Respiration: Impaired Level of Impairment: Word Phonation: Normal Articulation: Impaired Level of Impairment: Phrase Intelligibility: Intelligibility reduced Word: 75-100% accurate Phrase: 50-74% accurate Sentence: 50-74% accurate Motor Planning: Witnin functional limits Effective Techniques: Slow rate;Increased vocal intensity;Over-articulate  Care Tool Care Tool Cognition Expression of Ideas and Wants Expression of Ideas and Wants: Some difficulty - exhibits some difficulty with expressing needs and ideas (e.g, some words or finishing thoughts) or  speech is not clear   Understanding Verbal and Non-Verbal Content Understanding Verbal and Non-Verbal Content: Usually understands - understands most conversations, but misses some part/intent of message. Requires cues at times to understand   Memory/Recall Ability *first 3 days only Memory/Recall Ability *first 3 days only: That he or she is in a hospital/hospital unit;Current season    Intelligibility: Intelligibility reduced Word: 75-100% accurate Phrase: 50-74% accurate Sentence: 50-74% accurate  Bedside Swallowing Assessment General Previous Swallow Assessment: n/a Diet Prior to this Study: Regular;Thin liquids Temperature  Spikes Noted: No Respiratory Status: Room air History of Recent Intubation: No Behavior/Cognition: Alert;Cooperative Oral Cavity - Dentition: Dentures, top;Edentulous;Missing dentition Self-Feeding Abilities: Able to feed self Patient Positioning: Upright in bed Baseline Vocal Quality: Normal Volitional Cough: Weak Volitional Swallow: Able to elicit  Oral Care Assessment Does patient have any of the following "high(er) risk" factors?: None of the above Does patient have any of the following "at risk" factors?: None of the above Patient is LOW RISK: Follow universal precautions (see row information) Ice Chips Ice chips: Not tested Thin Liquid Thin Liquid: Within functional limits Nectar Thick Nectar Thick Liquid: Not tested Honey Thick Honey Thick Liquid: Not tested Puree Puree: Impaired Presentation: Self Fed;Spoon Oral Phase Impairments: Reduced lingual movement/coordination Other Comments: c/o food sticking Solid Solid: Impaired Oral Phase Impairments: Impaired mastication;Reduced lingual movement/coordination;Poor awareness of bolus Oral Phase Functional Implications: Impaired mastication;Oral residue;Right lateral sulci pocketing Pharyngeal Phase Impairments: Cough - Immediate BSE Assessment Risk for Aspiration Impact on safety and  function: Mild aspiration risk  Short Term Goals: Week 1: SLP Short Term Goal 1 (Week 1): Pt will tolerate Dys 2/thin diet with min s/s dysphagia/aspiration with mod A cues for use of strategies SLP Short Term Goal 2 (Week 1): Pt will trial Dys 3 textures with min s/s dysphagia/aspiration with mod A cues for use of strategies SLP Short Term Goal 3 (Week 1): Pt will understand, recall and utilize compensatory strategies to increase speech intelligibility to 75% with mod A verbal cues SLP Short Term Goal 4 (Week 1): Pt will increase sustained attention to functional tasks to >5 minutes with min A verbal cues SLP Short Term Goal 5 (Week 1): Pt will recall novel and functional information with 70% accuracy provided mod A cues SLP Short Term Goal 6 (Week 1): Pt will complete midly complex problem solving tasks with 50% accuracy provided mod A cues  Refer to Care Plan for Long Term Goals  Recommendations for other services: Neuropsych  Discharge Criteria: Patient will be discharged from SLP if patient refuses treatment 3 consecutive times without medical reason, if treatment goals not met, if there is a change in medical status, if patient makes no progress towards goals or if patient is discharged from hospital.  The above assessment, treatment plan, treatment alternatives and goals were discussed and mutually agreed upon: by patient and by family  Dewaine Conger 02/22/2021, 4:03 PM

## 2021-02-24 DIAGNOSIS — I639 Cerebral infarction, unspecified: Secondary | ICD-10-CM | POA: Diagnosis not present

## 2021-02-24 LAB — COMPREHENSIVE METABOLIC PANEL
ALT: 38 U/L (ref 0–44)
AST: 40 U/L (ref 15–41)
Albumin: 3 g/dL — ABNORMAL LOW (ref 3.5–5.0)
Alkaline Phosphatase: 127 U/L — ABNORMAL HIGH (ref 38–126)
Anion gap: 8 (ref 5–15)
BUN: 25 mg/dL — ABNORMAL HIGH (ref 6–20)
CO2: 23 mmol/L (ref 22–32)
Calcium: 9.4 mg/dL (ref 8.9–10.3)
Chloride: 105 mmol/L (ref 98–111)
Creatinine, Ser: 1.56 mg/dL — ABNORMAL HIGH (ref 0.61–1.24)
GFR, Estimated: 51 mL/min — ABNORMAL LOW (ref >60)
Glucose, Bld: 108 mg/dL — ABNORMAL HIGH (ref 70–99)
Potassium: 5.1 mmol/L (ref 3.5–5.1)
Sodium: 136 mmol/L (ref 135–145)
Total Bilirubin: 1.5 mg/dL — ABNORMAL HIGH (ref 0.3–1.2)
Total Protein: 7.3 g/dL (ref 6.5–8.1)

## 2021-02-24 LAB — CBC WITH DIFFERENTIAL/PLATELET
Abs Immature Granulocytes: 0.02 10*3/uL (ref 0.00–0.07)
Basophils Absolute: 0.1 10*3/uL (ref 0.0–0.1)
Basophils Relative: 1 %
Eosinophils Absolute: 0.3 10*3/uL (ref 0.0–0.5)
Eosinophils Relative: 3 %
HCT: 51.5 % (ref 39.0–52.0)
Hemoglobin: 17.7 g/dL — ABNORMAL HIGH (ref 13.0–17.0)
Immature Granulocytes: 0 %
Lymphocytes Relative: 11 %
Lymphs Abs: 1 10*3/uL (ref 0.7–4.0)
MCH: 33.4 pg (ref 26.0–34.0)
MCHC: 34.4 g/dL (ref 30.0–36.0)
MCV: 97.2 fL (ref 80.0–100.0)
Monocytes Absolute: 1 10*3/uL (ref 0.1–1.0)
Monocytes Relative: 11 %
Neutro Abs: 6.7 10*3/uL (ref 1.7–7.7)
Neutrophils Relative %: 74 %
Platelets: 189 10*3/uL (ref 150–400)
RBC: 5.3 MIL/uL (ref 4.22–5.81)
RDW: 15.1 % (ref 11.5–15.5)
WBC: 9.1 10*3/uL (ref 4.0–10.5)
nRBC: 0 % (ref 0.0–0.2)

## 2021-02-24 NOTE — Progress Notes (Signed)
Occupational Therapy Session Note  Patient Details  Name: Juan Wheeler MRN: [id number] Date of Birth: Mar 08, 1961  Today's Date: 02/24/2021 OT Individual Time: 9784-7841 OT Individual Time Calculation (min): 59 min    Short Term Goals: Week 1:  OT Short Term Goal 1 (Week 1): Pt will don shirt with mod A via hemi-technique. OT Short Term Goal 2 (Week 1): Pt will maintain static sitting balance unsupported with close S for >5 min. OT Short Term Goal 3 (Week 1): Pt will complete STS with mod A in prep for standing ADL.  Skilled Therapeutic Interventions/Progress Updates:    Treatment session with focus on self-care retraining, dynamic sitting balance, sit > stand, dynamic standing balance, transfers, and RUE NMR. Pt received supine in bed agreeable to bathing and dressing from EOB.  Pt completed bed mobility with min-mod assist to roll to EOB.  Therapist educating on hemi-technique during mobility and attention to RUE during mobility.  Pt required CGA to min assist for sitting balance during UB bathing. Pt intermittently stating that he couldn't do something prior to attempts, however educated on hemi-technique and therapist assisting due to RUE hemi.  Completed sit > stand from EOB with mod assist, therapist bracing at R knee during sit > stand.  Pt able to assist with pulling pants over L hip while therapist supported pt in standing.  Completed squat pivot transfer bed > w/c to R with mod assist.  Engaged in [place] in sitting and standing with towel glides on table initially in standing, requiring mod assist for standing.  Transitioned to completing in sitting due to fatigue.  Pt able to elicit some shoulder flexion and abduction/adduction with towel glides in sitting when therapist providing support at elbow to decrease gravity and with visual target.  Pt using slight compensatory trunk movements, however therapist giving cue to draw awareness. Pt returned to room and transferred w/c > bed with min  assist with LUE support on bed.    Therapy Documentation Precautions:  Precautions Precautions: Fall Precaution Comments: dense R hemi, dysarthria, colostomy Restrictions Weight Bearing Restrictions: No Pain: Pt with no c/o pain  Therapy/Group: Individual Therapy  Simonne Come 02/24/2021, 11:56 AM

## 2021-02-24 NOTE — Progress Notes (Signed)
Physical Therapy Session Note  Patient Details  Name: Juan Wheeler MRN: [id number] Date of Birth: 1961/07/08  Today's Date: 02/24/2021 PT Individual Time: 9528-4132 PT Individual Time Calculation (min): 69 min   Short Term Goals: Week 1:  PT Short Term Goal 1 (Week 1): Pt will perform bed mobility with mod assist PT Short Term Goal 2 (Week 1): Pt will transfer to [facility] with mod assist PT Short Term Goal 3 (Week 1): Pt will ambulate with mod assist +2 for safety and LRAD x 71ft PT Short Term Goal 4 (Week 1): Pt will propell WC with supervisoin assist >173ft  Skilled Therapeutic Interventions/Progress Updates:   Received pt sitting in WC, pt agreeable to therapy, and denied any pain during session but reported feeling fatigued. Session with emphasis on functional mobility/transfers, generalized strengthening, dynamic sitting/standing balance/coordination, NMR, and improved activity tolerance. Pt performed WC mobility 154ft using R hemi technique and supervision with cues to avoid running into obstacles on R side. Pt transported remainder of way to 4W therapy gym in [facility] total A and transferred WC<>mat squat<>pivot to R with mod A blocking R knee and sit<>supine with mod A. Pt performed the following activities supine on mat: -heel slides x10 bilaterally; AAROM on RLE (total A to flex knee but pt with traces of knee extension and able to straighten out RLE slightly -bridges 2x10; pt able to prevent excessive R hip abduction ~50% of time but required total A to achieve hooklying position on R -bridges with LLE extended to facilitate increased weight bearing through RLE 2x10; pt required increased assist to maintain LEs in hooklying.  -hip adductor ball squeezes 2x10 with emphasis on R hip adductor strengthening and preventing excessive R hip abduction -SAQ 2x8 AAROM on RLE and 2x10 on LLE. Pt with traces of R quad activation.  Pt transferred supine<>R sidelying<>sitting EOB with mod A and cues with  total A to manage RLE. Pt transferred sit<>stand with mod A x2 trials with RUE supported around therapist and blocking R knee. Worked on dynamic standing balance, upright posture, R knee extension, and R lateral weight shifting hooking/unhooking horseshoes to mirror with mod A for balance as pt with multiple R lateral LOB. Pt then reported urge to use restroom and transferred mat<>WC to L with min A and transported back to room in [facility] total A. Pt requested to use urinal in bed and transferred WC<>bed squat<>pivot to L with min A and sit<>supine with mod A for RLE management. Pt able to manage pants and brief with min A and void in urinal with supervision and requested to remain in bed at end of session. Scooted to [facility] with supervision pulling with LUE on headboard. Concluded session with pt supine in bed, needs within reach, and bed alarm on. RUE supported on pillow and provided pt with fresh drink.   Therapy Documentation Precautions:  Precautions Precautions: Fall Precaution Comments: dense R hemi, dysarthria, colostomy Restrictions Weight Bearing Restrictions: No  Therapy/Group: Individual Therapy Alfonse Alpers PT, DPT   02/24/2021, 7:17 AM

## 2021-02-24 NOTE — Progress Notes (Signed)
Speech Language Pathology Daily Session Note  Patient Details  Name: Juan Wheeler MRN: [id number] Date of Birth: 1961/04/04  Today's Date: 02/24/2021 SLP Individual Time: 2751-7001 SLP Individual Time Calculation (min): 44 min  Short Term Goals: Week 1: SLP Short Term Goal 1 (Week 1): Pt will tolerate Dys 2/thin diet with min s/s dysphagia/aspiration with mod A cues for use of strategies SLP Short Term Goal 2 (Week 1): Pt will trial Dys 3 textures with min s/s dysphagia/aspiration with mod A cues for use of strategies SLP Short Term Goal 3 (Week 1): Pt will understand, recall and utilize compensatory strategies to increase speech intelligibility to 75% with mod A verbal cues SLP Short Term Goal 4 (Week 1): Pt will increase sustained attention to functional tasks to >5 minutes with min A verbal cues SLP Short Term Goal 5 (Week 1): Pt will recall novel and functional information with 70% accuracy provided mod A cues SLP Short Term Goal 6 (Week 1): Pt will complete midly complex problem solving tasks with 50% accuracy provided mod A cues  Skilled Therapeutic Interventions:Skilled ST services focused on swallow and speech skills. Pt reported difficulty swallowing dys 2 textures on lunch tray due to ill fitting dentures and globus sensation. SLP facilitated PO consumption of dys 2 textured snack and thin liquids via straw, Pt demonstrated x1 throat clearing reporting regurgitating mild amounts of food and x1 belch. SLP educated pt to consume small bites/sips (in which he was demonstrating mod I), swallow hard/effeortfully, alternating solids/liquids and remain upright for 30-45 minutes. Pt required min A verbal cues to alternate solids/lqiuids and demonstrate effortful swallow., in which pt reported improvement in globus sensation. Pt reported speech intelligibility is 50% back to baseline due to dysarthria and further impacted by not wearing dentures. Pt required mod A verbal cues to slow rate and  over articulate at sentence level for 70% intelligibility. Pt's cousins entered room, SLP provided education pertaining to current swallow and speech strategies, with pt's permission. All questions answered to satisfaction. Pt was left in room with family, call bell within reach and bed alarm set. SLP recommends to continue skilled services.     Pain Pain Assessment Pain Score: 0-No pain  Therapy/Group: Individual Therapy  Torra Pala  [facility] 02/24/2021, 1:48 PM

## 2021-02-24 NOTE — Progress Notes (Signed)
Inpatient Rehabilitation Care Coordinator Assessment and Plan Patient Details  Name: Juan Wheeler MRN: [id number] Date of Birth: 09-30-1960  Today's Date: 02/24/2021  Hospital Problems: Principal Problem:   Acute cerebral infarction [facility]) Active Problems:   Ischemic cerebrovascular accident (CVA) of frontal lobe [facility])  Past Medical History:  Past Medical History:  Diagnosis Date  . Chronic lower back pain    a. Followed by pain management at [facility].  . Colon cancer [facility])    rectal cancer  . Coronary artery disease    a. 03/2013: abnl nuc -> LHC s/p DES to LCx, residual moderate disease in LAD (med rx unless refractory angina). b. Not on BB due to bradycardia.  Marland Kitchen DVT (deep venous thrombosis) ([place]) ~ 2013  . Dysrhythmia    AFib  . GERD (gastroesophageal reflux disease)   . H/O necrotising fasciitis   . History of blood transfusion    "once; after throwing up alot of blood" (04/17/2013)  . Hypertension   . LV dysfunction    a. EF 45% in 03/2013.  Marland Kitchen PAD (peripheral artery disease) ([place])    a. Occlusion of the right internal iliac artery, with significant atherosclerosis in the left internal iliac which was not amenable to reconstruction per notes from [facility] in place 05/30/2020  . Pulmonary nodules 09/30/2014  . Rectal cancer ([place])   . Tobacco abuse    Past Surgical History:  Past Surgical History:  Procedure Laterality Date  . ABDOMINAL SURGERY  1990's   'for stomach ulcers" (04/17/2013)  . COLECTOMY  2012   "for rectal cancer" (04/17/2013)  . COLONOSCOPY  2013   Dr. Cheryll Cockayne: colorectal anastomosis with ulcer and inflammation, benign biopsy  . COLONOSCOPY WITH PROPOFOL N/A 09/07/2016   Procedure: COLONOSCOPY WITH PROPOFOL;  Surgeon: Daneil Dolin, MD;  [facility];  Service: Endoscopy;  Laterality: N/A;  10:00 am Colonoscopy via rectum and ostomy  . COLOSTOMY TAKEDOWN  2013  . CORONARY ANGIOPLASTY WITH STENT PLACEMENT  04/17/2013    "?1" (04/17/2013)  . FEMORAL-POPLITEAL BYPASS GRAFT Left 07/02/2015   Procedure: BYPASS GRAFT LEFT COMMON FEMORAL ARTERY TO LEFT ABOVE KNEE POPLITEAL ARTERY - USING LEFT GREATER SAPPHENOUS VEIN;  Surgeon: Elam Dutch, MD;  Location: [place];  Service: Vascular;  Laterality: Left;  . INCISION AND DRAINAGE ABSCESS Left 06/05/2016   Procedure: INCISION AND DRAINAGE ABSCESS;  Surgeon: Aviva Signs, MD;  Location: [facility] ORS;  Service: General;  Laterality: Left;  . INCISION AND DRAINAGE PERIRECTAL ABSCESS Left 06/07/2016   Procedure: IRRIGATION AND DEBRIDEMENT LEFT BUTTOCK ABSCESS;  Surgeon: Greer Pickerel, MD;  Location: [place];  Service: General;  Laterality: Left;  . INGUINAL HERNIA REPAIR Bilateral 1990's  . LAPAROSCOPIC PARTIAL COLECTOMY N/A 06/11/2016   Procedure: LAPAROSCOPIC  OPEN COLOSTOMY;  Surgeon: [name], MD;  Location: [place];  Service: General;  Laterality: N/A;  . LEFT HEART CATHETERIZATION WITH CORONARY ANGIOGRAM N/A 04/17/2013   Procedure: LEFT HEART CATHETERIZATION WITH CORONARY ANGIOGRAM;  Surgeon: Peter M Martinique, MD;  Location: [facility] CATH LAB;  Service: Cardiovascular;  Laterality: N/A;  . PERCUTANEOUS STENT INTERVENTION  04/17/2013   Procedure: PERCUTANEOUS STENT INTERVENTION;  Surgeon: Peter M Martinique, MD;  Location: [facility] CATH LAB;  Service: Cardiovascular;;  . PERIPHERAL VASCULAR CATHETERIZATION N/A 06/14/2015   Procedure: Abdominal Aortogram;  Surgeon: Elam Dutch, MD;  Location: [place] CV LAB;  Service: Cardiovascular;  Laterality: N/A;  . PERIPHERAL VASCULAR CATHETERIZATION Bilateral 06/14/2015   Procedure: Lower  Extremity Angiography;  Surgeon: Elam Dutch, MD;  Location: [place] CV LAB;  Service: Cardiovascular;  Laterality: Bilateral;  . PORTACATH PLACEMENT Left 06/05/2020   Procedure: INSERTION PORT-A-CATH;  Surgeon: Aviva Signs, MD;  Location: [facility];  Service: General;  Laterality: Left;  Marland Kitchen VEIN HARVEST Left 07/02/2015   Procedure: VEIN HARVEST - LEFT  GREATER SAPPHENOUS VEIN;  Surgeon: Elam Dutch, MD;  Location: [place];  Service: Vascular;  Laterality: Left;   Social History:  reports that he has been smoking cigarettes. He started smoking about 46 years ago. He has a 10.00 pack-year smoking history. He has never used smokeless tobacco. He reports current alcohol use of about 3.0 standard drinks of alcohol per week. He reports current drug use. Drug: Marijuana.  Family / Support Systems Marital Status: Married Patient Roles: Spouse,Parent Spouse/Significant Other: Juan Wheeler [phone]  [phone] Children: Pt has five children and wife has one child Other Supports: His sister she lives in [place] Anticipated Caregiver: Wife and sister Ability/Limitations of Caregiver: wife works days and plans to take off one week at [place]. Sister lives in [place] Availability: 24/7 (short time) Family Dynamics: Close with wife and children they call and check on him. Wife plans to be off for a week for transition home from the hospital. His kids can not provide assist-sister may be able too  Social History Preferred language: English Religion: Non-Denominational Cultural Background: No issues Education: HS Read: Yes Write: Yes Employment Status: Disabled Public relations account executive Issues: No issues Guardian/Conservator: None-according to MD pt is capable of making his own decisions but will include wife since at times it is difficult to understand pt   Abuse/Neglect Abuse/Neglect Assessment Can Be Completed: Yes Physical Abuse: Denies Verbal Abuse: Denies Sexual Abuse: Denies Exploitation of patient/patient's resources: Denies Self-Neglect: Denies  Emotional Status Pt's affect, behavior and adjustment status: Pt is motivated to improve he has always been independent and was still driving prior to this CVA. He is not one to ask for assist and does not want to burden his wife. He is hopeful he will do well here Recent  Psychosocial Issues: cancer diagnosis and treatments, along with chronic back issues Psychiatric History: No history do feel pt would benefit from seeing neuro-psych while here for coping Substance Abuse History: Pt admits to smoking tobacco and THC positive he feels it helps him with the pain and for appetite. Aware of the risks he is taking.  Patient / Family Perceptions, Expectations & Goals Pt/Family understanding of illness & functional limitations: Pt and wife can explain his stroke and deficits, both are hopeful he will do well here and recover. Both talk with the MD and feel they have a understanding of his treatment going forward. Premorbid pt/family roles/activities: Husband, father, sibling, retiree, cancer survivor, etc Anticipated changes in roles/activities/participation: resume Pt/family expectations/goals: Pt states: " I want to do well and to for myself again.'  Wife states: " I hope he can be mobile he is not one to sit still.'  [employer]: Other (Comment) [facility] for cancer follow up) Premorbid Home Care/DME Agencies: Other (Comment) (has cane) Transportation available at discharge: Wife, pt drove PTA Resource referrals recommended: Neuropsychology  Discharge Planning Living Arrangements: Spouse/significant other Support Systems: Spouse/significant other,Children,Other relatives,Friends/neighbors Type of Residence: Private residence Insurance Resources: Multimedia programmer (specify),Medicaid (specify county) Actor) Museum/gallery curator Resources: [employer] Support Financial Screen Referred: No Living Expenses: Own Money Management: Spouse Does the patient have any problems obtaining your medications?: No Home  Management: Wife Patient/Family Preliminary Plans: Return home with wife who does work during the day, plans on taking one week off for the transition home at [place]. Made aware pt will need 24/7 care at [place]. Both hope he will exceed his  goals of min assist level. Care Coordinator Barriers to Discharge: Lack of/limited family support,Insurance for SNF coverage Care Coordinator Barriers to Discharge Comments: Wife works first shift and sister lives in another town Care Coordinator Anticipated Follow Up Needs: [place] at times hard to understand gentleman who is motivated to improve and regain his independence from this stroke. His wife is involved and plans to assist at discharge. Will work on discharge needs and wife to check to see if sister can assist and for how long. Work on 24/7 care.  Elease Hashimoto 02/24/2021, 1:30 PM

## 2021-02-24 NOTE — Progress Notes (Signed)
[facility] Individual Statement of Services  Patient Name:  Juan Wheeler  Date:  02/24/2021  Welcome to the [facility].  Our goal is to provide you with an individualized program based on your diagnosis and situation, designed to meet your specific needs.  With this comprehensive rehabilitation program, you will be expected to participate in at least 3 hours of rehabilitation therapies Monday-Friday, with modified therapy programming on the weekends.  Your rehabilitation program will include the following services:  Physical Therapy (PT), Occupational Therapy (OT), Speech Therapy (ST), 24 hour per day rehabilitation nursing, Neuropsychology, Care Coordinator, Rehabilitation Medicine, Nutrition Services and Pharmacy Services  Weekly team conferences will be held on Tuesday to discuss your progress.  Your Inpatient Rehabilitation Care Coordinator will talk with you frequently to get your input and to update you on team discussions.  Team conferences with you and your family in attendance may also be held.  Expected length of stay: 16-21 days  Overall anticipated outcome: overall min assist level  Depending on your progress and recovery, your program may change. Your Inpatient Rehabilitation Care Coordinator will coordinate services and will keep you informed of any changes. Your Inpatient Rehabilitation Care Coordinator's name and contact numbers are listed  below.  The following services may also be recommended but are not provided by the [facility]:    [place] [facility] [place] will be made to provide these services after discharge if needed.  Arrangements include referral to agencies that provide these services.  Your insurance has been verified to be:  [facility] & medicaid Your primary doctor is:  [name]  Pertinent information will be shared with  your doctor and your insurance company.  Inpatient Rehabilitation Care Coordinator:  Ovidio Kin, [place] or ([phone]  Information discussed with and copy given to patient by: Elease Hashimoto, 02/24/2021, 1:15 PM

## 2021-02-24 NOTE — Progress Notes (Signed)
PROGRESS NOTE   Subjective/Complaints:    No pain c/os, no new issues , states he was smoking PTA  ROS: Patient denies CP, SOB, N/V/D.   Objective:   No results found. Recent Labs    02/24/21 0543  WBC 9.1  HGB 17.7*  HCT 51.5  PLT 189   Recent Labs    02/24/21 0543  NA 136  K 5.1  CL 105  CO2 23  GLUCOSE 108*  BUN 25*  CREATININE 1.56*  CALCIUM 9.4    Intake/Output Summary (Last 24 hours) at 02/24/2021 0909 Last data filed at 02/24/2021 0827 Gross per 24 hour  Intake 330 ml  Output 925 ml  Net -595 ml        Physical Exam: Vital Signs Blood pressure 127/81, pulse 68, temperature 97.7 F (36.5 C), temperature source Oral, resp. rate 16, weight 63.8 kg, SpO2 96 %.   General: No acute distress Mood and affect are appropriate Heart: irregular rate and rhythm no rubs murmurs or extra sounds Lungs: Clear to auscultation, breathing unlabored, no rales or wheezes Abdomen: Positive bowel sounds, soft nontender to palpation, nondistended, Colostomy FOS RLQ Extremities: No clubbing, cyanosis, or edema Skin: No evidence of breakdown, no evidence of rash  Neuro: alert, oriented. Follows basic commands. Feeding self breakfast. Dysarthric but language intact. Left gaze preference. Tending to all items on tray.  LUE and LLE 5/5. RUE 1-2/5, RLE trace hip/knee extensor synergy  0/5  distal.Musculoskeletal: Full ROM, No pain with AROM or PROM in the neck, trunk, or extremities. Posture appropriate     Assessment/Plan: 1. Functional deficits which require 3+ hours per day of interdisciplinary therapy in a comprehensive inpatient rehab setting.  Physiatrist is providing close team supervision and 24 hour management of active medical problems listed below.  Physiatrist and rehab team continue to assess barriers to discharge/monitor patient progress toward functional and medical goals  Care Tool:  Bathing     Body parts bathed by patient: Face   Body parts bathed by helper: Right arm,Left arm,Chest,Abdomen,Front perineal area,Buttocks,Right upper leg,Left upper leg,Right lower leg,Left lower leg     Bathing assist Assist Level: Total Assistance - Patient < 25%     Upper Body Dressing/Undressing Upper body dressing   What is the patient wearing?: Pull over shirt    Upper body assist Assist Level: Dependent - Patient 0%    Lower Body Dressing/Undressing Lower body dressing      What is the patient wearing?: Pants     Lower body assist Assist for lower body dressing: Total Assistance - Patient < 25%     Toileting Toileting    Toileting assist Assist for toileting: Maximal Assistance - Patient 25 - 49% Assistive Device Comment: colostomy, urinal   Transfers Chair/bed transfer  Transfers assist     Chair/bed transfer assist level: Maximal Assistance - Patient 25 - 49%     Locomotion Ambulation   Ambulation assist   Ambulation activity did not occur: Safety/medical concerns          Walk 10 feet activity   Assist  Walk 10 feet activity did not occur: Safety/medical concerns  Walk 50 feet activity   Assist Walk 50 feet with 2 turns activity did not occur: Safety/medical concerns         Walk 150 feet activity   Assist Walk 150 feet activity did not occur: Safety/medical concerns         Walk 10 feet on uneven surface  activity   Assist Walk 10 feet on uneven surfaces activity did not occur: Safety/medical concerns         Wheelchair     Assist Will patient use wheelchair at discharge?: Yes      Wheelchair assist level: Moderate Assistance - Patient 50 - 74% Max wheelchair distance: 150    Wheelchair 50 feet with 2 turns activity    Assist        Assist Level: Moderate Assistance - Patient 50 - 74%   Wheelchair 150 feet activity     Assist      Assist Level: Moderate Assistance - Patient 50 - 74%    Blood pressure 127/81, pulse 68, temperature 97.7 F (36.5 C), temperature source Oral, resp. rate 16, weight 63.8 kg, SpO2 96 %.  Medical Problem List and Plan: 1.  Dysarthria/right hemiparesis with left gaze preference secondary to multifocal acute ischemia within the bilateral, left greater than right multiple vascular territories due to cardioembolic source             -patient may shower             -ELOS/Goals: 14-17 days/supervision/min a            -Patient is beginning CIR therapies today including PT, OT, and SLP  2.  Antithrombotics: -DVT/anticoagulation: Pradaxa for Afib             -antiplatelet therapy: Aspirin 81 mg daily 3. Pain Management: Hydrocodone as needed 4. Mood: Provide emotional support             -antipsychotic agents: N/A 5. Neuropsych: This patient is capable of making decisions on his own behalf. 6. Skin/Wound Care: Routine skin checks 7. Fluids/Electrolytes/Nutrition: Routine in and outs             CMP ordered for Monday  -appears to have good appetite 8.  Hypertension.  Toprol-XL 50 mg daily.   Vitals:   02/23/21 1932 02/24/21 0403  BP: 102/80 127/81  Pulse: 87 68  Resp: 16 16  Temp: 97.8 F (36.6 C) 97.7 F (36.5 C)  SpO2: 92% 96%   9.  Hyperlipidemia: Zocor 10.  History of squamous cell carcinoma of the lung Follow-up outpatient 11.  Cardiomyopathy with history of bilateral pulmonary emboli.  Continue Pradaxa 12.  Atrial fibrillation.  Cardiac rate in 60s- 80s             Monitor heart rate with increased physical activity 13.  History of tobacco abuse.  Counseling 14.  GERD: Protonix drop with meds as below 15.  Thrombocytopenia Resolved  16.  ELevated Hgb- may have some hemoconcentration with elevated BUN/Creat enc po fluid, baseline 15-16 17.  Hx rectal adenoCA with colostomy from 2017- will have issues managing this due to hemiplegia LOS: 3 days A FACE TO [place] E Sachi Boulay 02/24/2021, 9:09 AM

## 2021-02-24 NOTE — Progress Notes (Signed)
Physical Therapy Session Note  Patient Details  Name: Juan Wheeler MRN: [id number] Date of Birth: Dec 12, 1960  Today's Date: 02/24/2021 PT Individual Time: 0920-1008 PT Individual Time Calculation (min): 48 min   Short Term Goals: Week 1:  PT Short Term Goal 1 (Week 1): Pt will perform bed mobility with mod assist PT Short Term Goal 2 (Week 1): Pt will transfer to [facility] with mod assist PT Short Term Goal 3 (Week 1): Pt will ambulate with mod assist +2 for safety and LRAD x 6ft PT Short Term Goal 4 (Week 1): Pt will propell WC with supervisoin assist >136ft  Skilled Therapeutic Interventions/Progress Updates:    Pt received supine in bed and agreeable to therapy session. Supine>sitting R EOB, mod/max assist for R hemibody management and trunk upright. Sitting EOB, donned tennis shoes max assist for time management- pt able to maintain sitting balance using L UE support as needed with supervision.  L squat pivot EOB>w/c mod assist for lifting/pivoting hips.  Transported to/from gym in w/c for time management and energy conservation. Sit<>stands to/from EOM during session with mod assist for lifting and balance while therapist blocked R knee - pt demos activation of R glute/quad but poor sustained contraction. Standing mini-squats with mirror feedback 2x10 reps with therapist blocking R knee and cuing/facilitating for increased R glute and quad activation - when pt does activate R quads it causes knee to move back into full extension/hyperextension. Donned R LE DF assist ACE wrap. Gait training 24ft 2x with 3 Musketeer support (R UE in protected position due to flaccidity) and +2 mod/max assist for balance and R LE management - pt able to initiate R LE advancement during swing though requires mod assist to complete forward movement every step - requires max assist to block R knee during stance phase to prevent buckling and block hyperextension, cuing for quad and glute activation along with forward  pelvic translation. Transported back to room in w/c and pt agreeable to remain sitting up - left with needs in reach, seat belt alarm on, and R UE supported on 1/2 lap tray.  Therapy Documentation Precautions:  Precautions Precautions: Fall Precaution Comments: dense R hemi, dysarthria, colostomy Restrictions Weight Bearing Restrictions: No  Pain: No reports of pain throughout session. However, therapist palpated a "popping" type sensation in R hip during squat pivot transfer and during gait - this was felt less during 2nd walk when thearpist able to facilitate a more normal gait pattern with R LE.   Therapy/Group: Individual Therapy  Tawana Scale , PT, DPT, CSRS  02/24/2021, 7:56 AM

## 2021-02-25 DIAGNOSIS — I639 Cerebral infarction, unspecified: Secondary | ICD-10-CM | POA: Diagnosis not present

## 2021-02-25 MED ORDER — METOPROLOL TARTRATE 25 MG PO TABS: 25.0000 mg | ORAL_TABLET | Freq: Two times a day (BID) | ORAL | Status: DC

## 2021-02-25 MED ORDER — METOPROLOL SUCCINATE ER 50 MG PO TB24
50.0000 mg | ORAL_TABLET | Freq: Every day | ORAL | Status: DC
  Administered 2021-02-25 – 2021-03-12 (×16): 50 mg via ORAL
  Filled 2021-02-25 (×15): qty 1

## 2021-02-25 NOTE — Progress Notes (Signed)
Patient ID: Juan Wheeler, male   DOB: 1961/07/23, 60 y.o.   MRN: [id number]  Met with pt to discuss team conference goals supervision-min assist and target discharge date 6/15. Have left message for wife and will await return call. Want to discuss care pt will need at discharge.

## 2021-02-25 NOTE — Progress Notes (Signed)
Speech Language Pathology Daily Session Note  Patient Details  Name: Juan Wheeler MRN: [id number] Date of Birth: May 01, 1961  Today's Date: 02/25/2021 SLP Individual Time: 9675-9163 SLP Individual Time Calculation (min): 60 min  Short Term Goals: Week 1: SLP Short Term Goal 1 (Week 1): Pt will tolerate Dys 2/thin diet with min s/s dysphagia/aspiration with mod A cues for use of strategies SLP Short Term Goal 2 (Week 1): Pt will trial Dys 3 textures with min s/s dysphagia/aspiration with mod A cues for use of strategies SLP Short Term Goal 3 (Week 1): Pt will understand, recall and utilize compensatory strategies to increase speech intelligibility to 75% with mod A verbal cues SLP Short Term Goal 4 (Week 1): Pt will increase sustained attention to functional tasks to >5 minutes with min A verbal cues SLP Short Term Goal 5 (Week 1): Pt will recall novel and functional information with 70% accuracy provided mod A cues SLP Short Term Goal 6 (Week 1): Pt will complete midly complex problem solving tasks with 50% accuracy provided mod A cues  Skilled Therapeutic Interventions: Skilled SLP intervention focused on dysphagia and cognition. Pt seen with pills whole with water. He was agreeable to ice cream snack and consumed 4 oz cup. He reported continued globus sensation with eggs and ground sausage. He demonstrated good recall of swallow strategies sign posted in room and was able to recall 2 of the written strategies. Pt completed speech intelligibility naming task at word/phrase level with mod A for use of overarticulation and segmenting words. SLP required clarification for 25% of woods/phrases. Pt left seated upright in bed with call button within reach and bed alarm set. Cont with therapy per plan of care.   Pain Pain Assessment Pain Scale: Faces Pain Score: 9  Faces Pain Scale: No hurt Pain Type: Chronic pain Pain Location: Back Pain Orientation: Mid;Lower Pain Descriptors / Indicators:  Aching Pain Frequency: Constant Pain Onset: On-going Patients Stated Pain Goal: 2  Therapy/Group: Individual Therapy  [name] Jlee Harkless 02/25/2021, 10:49 AM

## 2021-02-25 NOTE — Patient Care Conference (Signed)
Inpatient RehabilitationTeam Conference and Plan of Care Update Date: 02/25/2021   Time: 1:11 PM    Patient Name: [place] Record Number: [id number]  Date of Birth: 11-05-1960 Sex: Male         Room/Bed: 5C08C/5C08C-01 Payor Info: Payor: [employer] MEDICARE / Plan: [employer] MEDICARE CHOICE PPO / Product Type: *No Product type* /    Admit Date/Time:  02/21/2021  2:00 PM  Primary Diagnosis:  Acute cerebral infarction [facility])  Hospital Problems: Principal Problem:   Acute cerebral infarction [facility]) Active Problems:   Ischemic cerebrovascular accident (CVA) of frontal lobe [facility])    Expected Discharge Date: Expected Discharge Date: 03/12/21  Team Members Present: Physician leading conference: Dr. Leeroy Cha Care Coodinator Present: Dorthula Nettles, RN, BSN, CRRN;Becky Dupree, LCSW Nurse Present: Dorthula Nettles, RN PT Present: Becky Sax, PT OT Present: Simonne Come, OT SLP Present: Charolett Bumpers, SLP PPS Coordinator present : Gunnar Fusi, SLP     Current Status/Progress Goal Weekly Team Focus  Bowel/Bladder   Continent of bowel and bladder: LBM 5/30  Remain continent of bowel and bladder.  Assess bowel and bladder needs q shift and PRN.   Swallow/Nutrition/ Hydration   dys 2 textures and thin liquids, report globus sensation  supervision A  carryover of swallow startegies and dys 3 trials   ADL's   Mod A bathing and UB dressing, total A LB dressing, Mod A transfers, RUE weakness and RLE instability  Min A overall, Supervision grooming and UB dressing  ADL retraining, sit > stand, transfers, RUE NMR, activity tolerance/endurance   Mobility   bed mobility max A, transfers max A, no gait currently  min/mod A; supervision WC mobility  bed mobility, functional mobility/transfers, generalized strengthening, dynamic sitting/standing balance/coordination, NMR, and endurance to activity.   Communication   sentence level 70% mod A  Supervision A  speech  intelligibility strategies   Safety/Cognition/ Behavioral Observations  mod-max A  Supervision A  sustained attention, problem solving and recall   Pain   Patient currently rates his pain 0/10.  Pain <3/10  Assess pain q shift and PRN, medicate as directed.   Skin   Chronic sacral wound  Pt remains free of infection and skin breakdown.  Assess skin q shift and PRN     Discharge Planning:  Home with wife and family member to assist-will confirm sister's assist wife would not committ to her assisting-lives in [place]   Team Discussion: MD asking SLP if it is ok to swallow medications whole? BP is running soft. MD asking if having dizziness during therapy? Patient has ostomy that we are managing while he is on rehab. Pain is managed with PRN medications, some drainage noted from chronic wound to buttocks, covered with foam dressing. Educating family on colostomy care due to patient being at an independent level prior to admission. To be discharged home with wife but will require 24/7 care. Patient on target to meet rehab goals: yes, he has not complained of any dizziness with therapy. Mod assist with bed mobility, transfers to the left, 50 ft +2 and the left knee buckles and hyperextends. Min/mod assist with steps, supervision with W/C. Mod assist with bathing and upper body dressing, max/total assist with lower body dressing. Some movement in shoulder, decreased awareness of it. SLP has supervision goals, he can swallow medications whole in puree. Will begin a Dys 3 trial. Mod/max assist for cognition at this point.   *See Care Plan and progress notes  for long and short-term goals.   Revisions to Treatment Plan:  MD making medication adjustments.  Teaching Needs: Family education, medication management, pain management, colostomy care and management, skin/wound care, transfer training, gait training, stair training, balance training, endurance training, safety awareness.  Current Barriers to  Discharge: Decreased caregiver support, Medical stability, Home enviroment access/layout, Wound care, Lack of/limited family support, Medication compliance, Behavior and Nutritional means  Possible Resolutions to Barriers: Continue current medications, provide emotional support.     Medical Summary Current Status: colostomy, dysphagia, AKI, hypotension, cardiomyopathy, flaccid hemiparesis  Barriers to Discharge: Medical stability;Wound care  Barriers to Discharge Comments: colostomy, dysphagia, AKI, hypotension, cardiomyopathy, flaccid hemiparesis Possible Resolutions to [employer] Focus: provide colostomy education, encourage hydration, continue pradaxa, metoprolol and monitor BP TID, stroke education   Continued Need for Acute Rehabilitation Level of Care: The patient requires daily medical management by a physician with specialized training in physical medicine and rehabilitation for the following reasons: Direction of a multidisciplinary physical rehabilitation program to maximize functional independence : Yes Medical management of patient stability for increased activity during participation in an intensive rehabilitation regime.: Yes Analysis of laboratory values and/or radiology reports with any subsequent need for medication adjustment and/or medical intervention. : Yes   I attest that I was present, lead the team conference, and concur with the assessment and plan of the team.   Cristi Loron 02/25/2021, 1:21 PM

## 2021-02-25 NOTE — IPOC Note (Signed)
Overall Plan of Care [facility]) Patient Details Name: Juan Wheeler MRN: [id number] DOB: 1961-08-23  Admitting Diagnosis: Acute cerebral infarction [facility])  Hospital Problems: Principal Problem:   Acute cerebral infarction [facility]) Active Problems:   Ischemic cerebrovascular accident (CVA) of frontal lobe ([place])     Functional Problem List: Nursing Bladder,Bowel,Edema,Endurance,Medication Management,Pain,Safety,Skin Integrity  PT Balance,Behavior,Edema,Endurance,Motor,Nutrition,Pain,Perception,Safety,Sensory,Skin Integrity  OT Balance,Motor,Sensory,Skin Integrity,Pain,Cognition,Endurance,Safety,Perception,Vision  SLP Cognition,Safety,Nutrition  TR         Basic ADL's: OT Eating,Grooming,Bathing,Dressing,Toileting     Advanced  ADL's: OT       Transfers: PT Bed Mobility,Bed to [place]  OT Toilet,Tub/Shower     Locomotion: PT Ambulation,Wheelchair Mobility,Stairs     Additional Impairments: OT Fuctional Use of Upper Extremity  SLP Swallowing      TR      Anticipated Outcomes Item Anticipated Outcome  Self Feeding S  Swallowing  Mod I   Basic self-care  min A  Toileting  min A   Bathroom Transfers min A  Bowel/Bladder  min assist  Transfers  Min assist trasnfers with LRAD  Locomotion  Min assist gait with LRAD at [place] hold distances. supervision assist WC mobility  Communication  Supervision  Cognition  Supervision basic cog  Pain  < 3  Safety/Judgment  min assist and no falls   Therapy Plan: PT Intensity: Minimum of 1-2 x/day ,45 to 90 minutes PT Frequency: 5 out of 7 days PT Duration Estimated Length of Stay: 16-21 days OT Intensity: Minimum of 1-2 x/day, 45 to 90 minutes OT Frequency: 5 out of 7 days OT Duration/Estimated Length of Stay: 16 to 18 days SLP Intensity: Minumum of 1-2 x/day, 30 to 90 minutes SLP Frequency: 3 to 5 out of 7 days SLP Duration/Estimated Length of Stay: 14-17 days   Due to the current state of  emergency, patients may not be receiving their 3-hours of Medicare-mandated therapy.   Team Interventions: Nursing Interventions Patient/Family Education,Bladder Management,Bowel Management,Disease Management/Prevention,Pain Management,Medication Management,Skin Care/Wound Management,Psychosocial Support  PT interventions Ambulation/gait training,Community Corporate treasurer re-education,Psychosocial support,Stair training,UE/LE Strength taining/ROM,Wheelchair propulsion/positioning,UE/LE Coordination activities,Therapeutic Activities,Skin care/wound Scientist, product/process development stimulation,Discharge planning,Balance/vestibular training,Disease management/prevention,Cognitive remediation/compensation,Functional mobility training,Patient/family education,Splinting/orthotics,Therapeutic Exercise,Visual/perceptual remediation/compensation  OT Interventions Balance/vestibular training,Community reintegration,Disease mangement/prevention,Functional electrical stimulation,Neuromuscular re-education,Patient/family education,Self Care/advanced ADL retraining,Splinting/orthotics,Therapeutic Exercise,UE/LE Museum/gallery conservator propulsion/positioning,Visual/perceptual remediation/compensation,UE/LE Strength taining/ROM,Therapeutic Activities,Skin care/wound managment,Psychosocial support,Pain management,Functional mobility training,DME/adaptive equipment instruction,Discharge planning,Cognitive remediation/compensation  SLP Interventions Cognitive remediation/compensation,Speech/Language facilitation,Therapeutic Activities,Functional tasks,Cueing hierarchy,Therapeutic Exercise,Internal/external aids,Dysphagia/aspiration precaution training,Medication managment,Patient/family education  TR Interventions    SW/CM Interventions Discharge Planning,Patient/Family Education,Psychosocial Support   Barriers to Discharge MD  Medical stability   Nursing Decreased caregiver support,Home environment access/layout,Wound Care,Lack of/limited family support,Medication compliance,Behavior,Nutrition means,Pending chemo/radiation    PT Inaccessible home environment,Home environment access/layout,Decreased caregiver support,Wound Care,Lack of/limited family support,Nutrition means    OT      SLP      SW Lack of/limited family support,Insurance for SNF coverage Wife works first shift and sister lives in another town   Team Discharge Planning: Destination: PT-Home ,OT- Home , SLP-Home Projected Follow-up: PT-Home health PT, OT-  Home health OT, SLP-Home Health SLP Projected Equipment Needs: PT-To be determined,Wheelchair cushion Editor, commissioning (measurements),Rolling walker with 5" wheels, OT- To be determined, SLP-None recommended by SLP Equipment Details: PT- , OT-  Patient/family involved in discharge planning: PT- Patient,Family member/caregiver,  OT-Patient,Family member/caregiver, SLP-Patient,Family member/caregiver  MD ELOS: 14-17 days S/MinA Medical Rehab Prognosis:  Excellent Assessment: Juan Wheeler is a 60 year old man who is admitted to CIR with dysarthria/right hemiparesis with left gaze preference secondary  to multifocal acute ischemia within the bilateral, left greater than right multiple vascular territories due to cardioembolic source. Labs and vitas are being monitored and medications adjusted as needed.    See Team Conference Notes for weekly updates to the plan of care

## 2021-02-25 NOTE — Progress Notes (Signed)
Physical Therapy Session Note  Patient Details  Name: Juan Wheeler MRN: [id number] Date of Birth: June 16, 1961  Today's Date: 02/25/2021 PT Individual Time: 1330-1440 PT Individual Time Calculation (min): 70 min   Short Term Goals: Week 1:  PT Short Term Goal 1 (Week 1): Pt will perform bed mobility with mod assist PT Short Term Goal 2 (Week 1): Pt will transfer to [facility] with mod assist PT Short Term Goal 3 (Week 1): Pt will ambulate with mod assist +2 for safety and LRAD x 47ft PT Short Term Goal 4 (Week 1): Pt will propell WC with supervisoin assist >130ft  Skilled Therapeutic Interventions/Progress Updates:   Received pt supine, pt agreeable to PT treatment, and reported mild low back pain but declined any pain interventions. Session with emphasis on functional mobility/transfers, generalized strengthening, dynamic standing balance/coordination, NMR, toileting, and improved activity tolerance. Pt transferred supine<>sitting EOB with mod A for RLE management and cues for attention to RUE and donned shoes sitting EOB with max A for time management purposes. Squat<>pivot bed<>WC to L with min A and transported to 4W therapy gym in [facility] total A for time management purposes. Pt performed squat<>pivot WC<>mat to R with min A while blocking R knee. Pt transferred sit<>stand with RUE supported around therapist with mod A and worked on dynamic standing balance performing the following activities using mirror for visual feedback with emphasis on R lateral weight shifting, R knee extension, and upright posture: -mini squats 4x5 reps with R knee blocked  -pre-gait stepping with LLE with R knee blocked 3x10 Transitioned to standing clipping/unclipping clothespins on basketball net using LUE with emphasis on R lateral weight shifting, upright posture, R knee extension and balance with RUE supported around therapist and blocking R knee x 3 trials. Pt then performed active assisted R knee extension on towel 3x8 but  lacked hamstring activation to perform knee flexion. Used mirror for visual feedback and target "kick towel towards mirror" with cues for upright posture. Worked on dynamic sitting balance and trunk control reaching for cones in upper L quadrant, crossing midline, and stacking them in lower R quadrant using LUE x 2 trials with min A for sitting balance. Squat<>pivot mat<>WC to R with mod A and transported back to room in [facility] total A and requested to return to bed. WC<>bed squat<>pivot with mod A to R and sit<>supine with mod A for RLE management. Pt reported urge to urinate and doffed brief/pants with mod A and able to void in urinal with supervision. Pt able to bridge hips to assist in pulling brief/pants up. Concluded session with pt supine in bed, needs within reach, and bed alarm on.   Therapy Documentation Precautions:  Precautions Precautions: Fall Precaution Comments: dense R hemi, dysarthria, colostomy Restrictions Weight Bearing Restrictions: No  Therapy/Group: Individual Therapy Alfonse Alpers PT, DPT   02/25/2021, 7:29 AM

## 2021-02-25 NOTE — Progress Notes (Signed)
Inpatient Rehabilitation  Patient information reviewed and entered into eRehab system by Melessa Cowell M. Trever Streater, M.A., CCC/SLP, PPS Coordinator.  Information including medical coding, functional ability and quality indicators will be reviewed and updated through discharge.    

## 2021-02-25 NOTE — Progress Notes (Signed)
PROGRESS NOTE   Subjective/Complaints: Was asked by pharmacy and nursing about pradaxa and metoprolol not being able to be crushed- cleared for regular pills by SLP Denies pain  ROS: Patient denies CP, SOB, N/V/D, pain   Objective:   No results found. Recent Labs    02/24/21 0543  WBC 9.1  HGB 17.7*  HCT 51.5  PLT 189   Recent Labs    02/24/21 0543  NA 136  K 5.1  CL 105  CO2 23  GLUCOSE 108*  BUN 25*  CREATININE 1.56*  CALCIUM 9.4    Intake/Output Summary (Last 24 hours) at 02/25/2021 1110 Last data filed at 02/25/2021 9563 Gross per 24 hour  Intake 720 ml  Output 1025 ml  Net -305 ml        Physical Exam: Vital Signs Blood pressure 100/86, pulse 71, temperature 97.7 F (36.5 C), temperature source Oral, resp. rate 17, weight 63.8 kg, SpO2 97 %. Gen: no distress, normal appearing HEENT: oral mucosa pink and moist, NCAT Cardio: Reg rate Chest: normal effort, normal rate of breathing Abd: soft, non-distended Ext: no edema Psych: pleasant, normal affect Skin: intact  Neuro: alert, oriented. Follows basic commands. Feeding self breakfast. Dysarthric but language intact. Left gaze preference. Tending to all items on tray.  LUE and LLE 5/5. RUE 1-2/5, RLE trace hip/knee extensor synergy  0/5  distal.Musculoskeletal: Full ROM, No pain with AROM or PROM in the neck, trunk, or extremities. Posture appropriate     Assessment/Plan: 1. Functional deficits which require 3+ hours per day of interdisciplinary therapy in a comprehensive inpatient rehab setting.  Physiatrist is providing close team supervision and 24 hour management of active medical problems listed below.  Physiatrist and rehab team continue to assess barriers to discharge/monitor patient progress toward functional and medical goals  Care Tool:  Bathing    Body parts bathed by patient: Right arm,Chest,Abdomen,Right upper leg,Left upper  leg,Face   Body parts bathed by helper: Left arm     Bathing assist Assist Level: Moderate Assistance - Patient 50 - 74%     Upper Body Dressing/Undressing Upper body dressing   What is the patient wearing?: Pull over shirt    Upper body assist Assist Level: Moderate Assistance - Patient 50 - 74%    Lower Body Dressing/Undressing Lower body dressing      What is the patient wearing?: Pants     Lower body assist Assist for lower body dressing: Maximal Assistance - Patient 25 - 49%     Toileting Toileting    Toileting assist Assist for toileting: Moderate Assistance - Patient 50 - 74% Assistive Device Comment: colostomy, urinal   Transfers Chair/bed transfer  Transfers assist     Chair/bed transfer assist level: Moderate Assistance - Patient 50 - 74% (squat pivot)     Locomotion Ambulation   Ambulation assist   Ambulation activity did not occur: Safety/medical concerns  Assist level: 2 helpers (+2 mod A) Assistive device: Other (comment) (3 Musketeer) Max distance: 21ft   Walk 10 feet activity   Assist  Walk 10 feet activity did not occur: Safety/medical concerns        Walk 50 feet activity  Assist Walk 50 feet with 2 turns activity did not occur: Safety/medical concerns         Walk 150 feet activity   Assist Walk 150 feet activity did not occur: Safety/medical concerns         Walk 10 feet on uneven surface  activity   Assist Walk 10 feet on uneven surfaces activity did not occur: Safety/medical concerns         Wheelchair     Assist Will patient use wheelchair at discharge?: Yes Type of Wheelchair: Manual    Wheelchair assist level: Supervision/Verbal cueing Max wheelchair distance: 150    Wheelchair 50 feet with 2 turns activity    Assist        Assist Level: Supervision/Verbal cueing   Wheelchair 150 feet activity     Assist      Assist Level: Supervision/Verbal cueing   Blood pressure  100/86, pulse 71, temperature 97.7 F (36.5 C), temperature source Oral, resp. rate 17, weight 63.8 kg, SpO2 97 %.  Medical Problem List and Plan: 1.  Dysarthria/right hemiparesis with left gaze preference secondary to multifocal acute ischemia within the bilateral, left greater than right multiple vascular territories due to cardioembolic source             -patient may shower             -ELOS/Goals: 14-17 days/supervision/min a           -Interdisciplinary Team Conference today  2.  Antithrombotics: -DVT/anticoagulation: Pradaxa for Afib             -antiplatelet therapy: Aspirin 81 mg daily 3. Pain Management: Continue Hydrocodone as needed 4. Mood: Provide emotional support             -antipsychotic agents: N/A 5. Neuropsych: This patient is capable of making decisions on his own behalf. 6. Skin/Wound Care: Routine skin checks 7. Fluids/Electrolytes/Nutrition: Routine in and outs             CMP ordered for Monday  -appears to have good appetite 8.  Hypertension.  Toprol-XL 50 mg daily.  BP soft- continue to monitor.  Vitals:   02/24/21 1937 02/25/21 0431  BP: 98/67 100/86  Pulse: 87 71  Resp: 18 17  Temp: 98.3 F (36.8 C) 97.7 F (36.5 C)  SpO2: 93% 97%   9.  Hyperlipidemia: Zocor 10.  History of squamous cell carcinoma of the lung Follow-up outpatient 11.  Cardiomyopathy with history of bilateral pulmonary emboli.  Continue Pradaxa 12.  Atrial fibrillation.  Cardiac rate in 60s- 80s             Continue to monitor heart rate with increased physical activity 13.  History of tobacco abuse.  Counseling 14.  GERD: Protonix drop with meds as below 15.  Thrombocytopenia Resolved  16.  ELevated Hgb- may have some hemoconcentration with elevated BUN/Creat enc po fluid, baseline 15-16 17.  Hx rectal adenoCA with colostomy from 2017- will have issues managing this due to hemiplegia 18. AKI: encourage hydration. Repeat creatinine tomorrow LOS: 4 days A FACE TO FACE EVALUATION  WAS PERFORMED  Shashana Fullington P Ayvah Caroll 02/25/2021, 11:10 AM

## 2021-02-25 NOTE — Progress Notes (Signed)
Occupational Therapy Session Note  Patient Details  Name: Juan Wheeler MRN: [id number] Date of Birth: 07/13/1961  Today's Date: 02/25/2021 OT Individual Time: 7543-6067 OT Individual Time Calculation (min): 54 min    Short Term Goals: Week 1:  OT Short Term Goal 1 (Week 1): Pt will don shirt with mod A via hemi-technique. OT Short Term Goal 2 (Week 1): Pt will maintain static sitting balance unsupported with close S for >5 min. OT Short Term Goal 3 (Week 1): Pt will complete STS with mod A in prep for standing ADL.  Skilled Therapeutic Interventions/Progress Updates:    Treatment session with focus on self-care retraining, dynamic sitting balance, functional transfers, and RUE NMR.  Pt received supine in bed agreeable to therapy session.  Pt declined bathing but wanting to get dressed.  Pt completed bed mobility with min-mod assist with cues and physical assistance for positioning of RUE and RLE during mobility.  Therapist providing CGA to Min assist for sitting balance during UB and LB dressing.  Pt able to thread LUE and LLE with cues for sequencing, therapist assisting with RUE/RLE due to hemiplegia.  Pt completed sit > stand with min-mod assist and blocking at R knee.  Pt required up to mod assist for dynamic standing balance as pt attempting to pull pants over L hip.  Completed squat pivot to R with mod assist to w/c.  Engaged in [place] in supine and sitting with focus on shoulder PROM/AAROM.  Pt demonstrating min activation at shoulder and elbow in supine with support and elbow to decrease impacts of gravity and facilitate improved movement.  Engaged in reciprocal and lateral scooting at edge of mat with focus on body awareness.  Utilized UE Ranger in sitting with pt able to complete 2 sets of 10 shoulder flexion/extension and horizontal abduction/adduction with tactile cues at elbow for improved positioning.  Therapist educated pt on self-ROM with proper hand placement for shoulder  flexion/extension and horizontal abduction/adduction.  Pt able to complete with min cues for improved hand placement. Pt returned to room and completed squat pivot transfer back to bed min assist to L.  Pt remained semi-reclined in bed with all needs in reach.  Therapy Documentation Precautions:  Precautions Precautions: Fall Precaution Comments: dense R hemi, dysarthria, colostomy Restrictions Weight Bearing Restrictions: No Pain: Pain Assessment Pain Scale: Faces Pain Score: 9  Faces Pain Scale: No hurt Pain Type: Chronic pain Pain Location: Back Pain Orientation: Mid;Lower Pain Descriptors / Indicators: Aching Pain Frequency: Constant Pain Onset: On-going Patients Stated Pain Goal: 2   Therapy/Group: Individual Therapy  Simonne Come 02/25/2021, 10:48 AM

## 2021-02-26 LAB — MAGNESIUM: Magnesium: 2.1 mg/dL (ref 1.7–2.4)

## 2021-02-26 LAB — BASIC METABOLIC PANEL
Anion gap: 11 (ref 5–15)
BUN: 28 mg/dL — ABNORMAL HIGH (ref 6–20)
CO2: 24 mmol/L (ref 22–32)
Calcium: 9.3 mg/dL (ref 8.9–10.3)
Chloride: 99 mmol/L (ref 98–111)
Creatinine, Ser: 1.54 mg/dL — ABNORMAL HIGH (ref 0.61–1.24)
GFR, Estimated: 51 mL/min — ABNORMAL LOW (ref >60)
Glucose, Bld: 98 mg/dL (ref 70–99)
Potassium: 4.4 mmol/L (ref 3.5–5.1)
Sodium: 134 mmol/L — ABNORMAL LOW (ref 135–145)

## 2021-02-26 NOTE — Progress Notes (Signed)
Speech Language Pathology Daily Session Note  Patient Details  Name: Juan Wheeler MRN: [id number] Date of Birth: Aug 27, 1961  Today's Date: 02/26/2021 SLP Individual Time: 0300-0320 SLP Individual Time Calculation (min): 20 min  Short Term Goals: Week 1: SLP Short Term Goal 1 (Week 1): Pt will tolerate Dys 2/thin diet with min s/s dysphagia/aspiration with mod A cues for use of strategies SLP Short Term Goal 2 (Week 1): Pt will trial Dys 3 textures with min s/s dysphagia/aspiration with mod A cues for use of strategies SLP Short Term Goal 3 (Week 1): Pt will understand, recall and utilize compensatory strategies to increase speech intelligibility to 75% with mod A verbal cues SLP Short Term Goal 4 (Week 1): Pt will increase sustained attention to functional tasks to >5 minutes with min A verbal cues SLP Short Term Goal 5 (Week 1): Pt will recall novel and functional information with 70% accuracy provided mod A cues SLP Short Term Goal 6 (Week 1): Pt will complete midly complex problem solving tasks with 50% accuracy provided mod A cues  Skilled Therapeutic Interventions:   Skilled SLP intervention focused on dysphagia. Pt agreeable to dys 3 trials with snack this session. He demonstrated slow mastication but able to clear solids adequately. Pt complained of "food getting stuck" and stopped eating after 3-4 bites. Voice was clear after all trials. No overt s/sx of aspiration or penetration with dys 3 snack this session. He was agreeable to trial dys 3 tray next session to assess tolerance. Cont with therapy per plan of care. Pt left seated upright in bed with bed alarm set and call button within reach.  Pain Pain Assessment Pain Scale: Faces Faces Pain Scale: No hurt  Therapy/Group: Individual Therapy  [name] Clarence Dunsmore 02/26/2021, 3:27 PM

## 2021-02-26 NOTE — Progress Notes (Signed)
PROGRESS NOTE   Subjective/Complaints:   Slept poorly with poor appetite   No pain , SOB , bowel or bladder issues at noc Feels like food sticks in throat working with SLP on dysphagia   ROS: Patient denies CP, SOB, N/V/D, pain   Objective:   No results found. Recent Labs    02/24/21 0543  WBC 9.1  HGB 17.7*  HCT 51.5  PLT 189   Recent Labs    02/24/21 0543 02/26/21 0512  NA 136 134*  K 5.1 4.4  CL 105 99  CO2 23 24  GLUCOSE 108* 98  BUN 25* 28*  CREATININE 1.56* 1.54*  CALCIUM 9.4 9.3    Intake/Output Summary (Last 24 hours) at 02/26/2021 0814 Last data filed at 02/25/2021 1846 Gross per 24 hour  Intake 720 ml  Output 400 ml  Net 320 ml        Physical Exam: Vital Signs Blood pressure 109/81, pulse 74, temperature 98 F (36.7 C), temperature source Oral, resp. rate 17, weight 63.8 kg, SpO2 95 %.  General: No acute distress Mood and affect are appropriate Heart: Regular rate and rhythm no rubs murmurs or extra sounds Lungs: Clear to auscultation, breathing unlabored, no rales or wheezes Abdomen: Positive bowel sounds, soft nontender to palpation, nondistended Extremities: No clubbing, cyanosis, or edema Skin: No evidence of breakdown, no evidence of rash  Neuro: alert, oriented. Follows basic commands. Feeding self breakfast. Dysarthric but language intact. Left gaze preference. Tending to all items on tray.  LUE and LLE 5/5. RUE 1-2/5, RLE trace hip/knee extensor synergy  0/5  distal.Musculoskeletal: Full ROM, No pain with AROM or PROM in the neck, trunk, or extremities. Posture appropriate     Assessment/Plan: 1. Functional deficits which require 3+ hours per day of interdisciplinary therapy in a comprehensive inpatient rehab setting.  Physiatrist is providing close team supervision and 24 hour management of active medical problems listed below.  Physiatrist and rehab team continue to assess  barriers to discharge/monitor patient progress toward functional and medical goals  Care Tool:  Bathing    Body parts bathed by patient: Right arm,Chest,Abdomen,Right upper leg,Left upper leg,Face   Body parts bathed by helper: Left arm     Bathing assist Assist Level: Moderate Assistance - Patient 50 - 74%     Upper Body Dressing/Undressing Upper body dressing   What is the patient wearing?: Pull over shirt    Upper body assist Assist Level: Moderate Assistance - Patient 50 - 74%    Lower Body Dressing/Undressing Lower body dressing      What is the patient wearing?: Pants     Lower body assist Assist for lower body dressing: Maximal Assistance - Patient 25 - 49%     Toileting Toileting    Toileting assist Assist for toileting: Moderate Assistance - Patient 50 - 74% Assistive Device Comment: colostomy, urinal   Transfers Chair/bed transfer  Transfers assist     Chair/bed transfer assist level: Minimal Assistance - Patient > 75%     Locomotion Ambulation   Ambulation assist   Ambulation activity did not occur: Safety/medical concerns  Assist level: 2 helpers (+2 mod A) Assistive device: Other (  comment) (3 Musketeer) Max distance: 97ft   Walk 10 feet activity   Assist  Walk 10 feet activity did not occur: Safety/medical concerns        Walk 50 feet activity   Assist Walk 50 feet with 2 turns activity did not occur: Safety/medical concerns         Walk 150 feet activity   Assist Walk 150 feet activity did not occur: Safety/medical concerns         Walk 10 feet on uneven surface  activity   Assist Walk 10 feet on uneven surfaces activity did not occur: Safety/medical concerns         Wheelchair     Assist Will patient use wheelchair at discharge?: Yes Type of Wheelchair: Manual    Wheelchair assist level: Supervision/Verbal cueing Max wheelchair distance: 150    Wheelchair 50 feet with 2 turns  activity    Assist        Assist Level: Supervision/Verbal cueing   Wheelchair 150 feet activity     Assist      Assist Level: Supervision/Verbal cueing   Blood pressure 109/81, pulse 74, temperature 98 F (36.7 C), temperature source Oral, resp. rate 17, weight 63.8 kg, SpO2 95 %.  Medical Problem List and Plan: 1.  Dysarthria/right hemiparesis with left gaze preference secondary to multifocal acute ischemia within the bilateral, left greater than right multiple vascular territories due to cardioembolic source             -patient may shower                       -Interdisciplinary Team Conference yesterday D/C date 6/15 2.  Antithrombotics: -DVT/anticoagulation: Pradaxa for Afib             -antiplatelet therapy: Aspirin 81 mg daily 3. Pain Management: Continue Hydrocodone as needed 4. Mood: Provide emotional support             -antipsychotic agents: N/A 5. Neuropsych: This patient is capable of making decisions on his own behalf. 6. Skin/Wound Care: Routine skin checks 7. Fluids/Electrolytes/Nutrition: Routine in and outs             CMP ordered for Monday  -appears to have good appetite 8.  Hypertension.  Toprol-XL 50 mg daily.  BP soft- continue to monitor.  Vitals:   02/25/21 1928 02/26/21 0600  BP: 104/73 109/81  Pulse: 86 74  Resp: 16 17  Temp: (!) 97.3 F (36.3 C) 98 F (36.7 C)  SpO2: 95% 95%  controlled 6/1 9.  Hyperlipidemia: Zocor 10.  History of squamous cell carcinoma of the lung Follow-up outpatient 11.  Cardiomyopathy with history of bilateral pulmonary emboli.  Continue Pradaxa 12.  Atrial fibrillation.  Cardiac rate in 60s- 80s             Continue to monitor heart rate with increased physical activity 13.  History of tobacco abuse.  Counseling 14.  GERD: Protonix drop with meds as below 15.  Thrombocytopenia Resolved  16.  ELevated Hgb- may have some hemoconcentration with elevated BUN/Creat enc po fluid, baseline 15-16 17.  Hx rectal  adenoCA with colostomy from 2017- will have issues managing this due to hemiplegia 18. AKI: encourage hydration. Repeat creatinine tomorrow 19.  Poor appetite, pt states it is due to food sticking in his throat, cont to work with SLP  LOS: 5 days A FACE TO FACE EVALUATION WAS PERFORMED  Charlett Blake 02/26/2021, 8:14  AM

## 2021-02-26 NOTE — Progress Notes (Signed)
Occupational Therapy Session Note  Patient Details  Name: ZYEN TRIGGS MRN: [id number] Date of Birth: May 29, 1961  Today's Date: 02/26/2021 OT Individual Time: 1345-1400 OT Individual Time Calculation (min): 15 min    Short Term Goals: Week 1:  OT Short Term Goal 1 (Week 1): Pt will don shirt with mod A via hemi-technique. OT Short Term Goal 2 (Week 1): Pt will maintain static sitting balance unsupported with close S for >5 min. OT Short Term Goal 3 (Week 1): Pt will complete STS with mod A in prep for standing ADL.  Skilled Therapeutic Interventions/Progress Updates:    Pt received in room lying supine in bed, consented to OT tx. Pt was seen for self-care retraining instruction and encouraged use of RUE as tolerated. Pt was Mod A for lying > sitting EOB. Provided cues for hemi-dressing/undressing techniques. Pt was Min A for bathing UB seated EOB primarily using L hand. Pt required assist for R side due to hemiplegia. Pt was Mod A for sitting EOB > lying in supine back to bed for LB bathing and dressing. Pt required max assist for LB bathing and dressing due to fatigue. Pt was able to roll and hike pants to hips once placed above his knees. Left pt lying supine in bed, call bell within reach and all needs met.  Therapy Documentation Precautions:  Precautions Precautions: Fall Precaution Comments: dense R hemi, dysarthria, colostomy Restrictions Weight Bearing Restrictions: No General: General OT Amount of Missed Time: 20 Minutes Vital Signs: Therapy Vitals Pulse Rate: 79 Resp: 17 BP: 128/87 Patient Position (if appropriate): Lying Oxygen Therapy SpO2: 94 % O2 Device: Room Air Pain: Pain Assessment Pain Scale: Faces Faces Pain Scale: No hurt   Therapy/Group: Individual Therapy  Amere Bricco 02/26/2021, 3:58 PM

## 2021-02-26 NOTE — Progress Notes (Signed)
Physical Therapy Session Note  Patient Details  Name: Juan Wheeler MRN: [id number] Date of Birth: 08/10/1961  Today's Date: 02/26/2021 PT Individual Time: 1100-1154 PT Individual Time Calculation (min): 54 min   Short Term Goals: Week 1:  PT Short Term Goal 1 (Week 1): Pt will perform bed mobility with mod assist PT Short Term Goal 2 (Week 1): Pt will transfer to [facility] with mod assist PT Short Term Goal 3 (Week 1): Pt will ambulate with mod assist +2 for safety and LRAD x 29ft PT Short Term Goal 4 (Week 1): Pt will propell WC with supervisoin assist >135ft  Skilled Therapeutic Interventions/Progress Updates:   Received pt sitting in WC in therapy gym, handoff from OT, pt agreeable to PT treatment, and denied any pain during session but continues to report fatigue. Session with emphasis on functional mobility/transfers, generalized strengthening, dynamic standing balance/coordination, gait training, NMR, and improved activity tolerance. Ace wrapped RLE into DF and eversion with total A and pt transferred sit<>stand with L rail in hallway with mod A and ambulated 48ft with max A +2 for close WC follow using mirror for visual feedback. Pt required manual facilitation for weight shifting and total A to advance RLE and to prevent R knee buckling/hyperextension. Squat<>pivot mat<>WC to R with mod A and worked on active assisted R knee extension using towel with external target and cues to "kick ball" 2x10 reps. Pt then transferred sit<>stand with 1in step underneath LLE to facilitate increased weight shifting and weight bearing onto RLE x 5 reps with max A and RUE supported around therapist. Pt with increased difficulty with this activity and required total A to block R knee from hyperextension/buckling. Transitioned to dynamic sitting balance performing R shoulder flexion towel slides to reach target of cone 2x10 reps. Pt required manual facilitation at elbow for full flexion ROM but with more shoulder  extension activation as pt able to bring towel back to starting point without assist. Squat<>pivot mat<>WC to L with min A and transported back to room in [facility] total A. Pt agreeable to remain in [facility] for lunch with convincing. Concluded session with pt sitting in WC, needs within reach, and seatbelt alarm on. R lap tray added to WC.   Therapy Documentation Precautions:  Precautions Precautions: Fall Precaution Comments: dense R hemi, dysarthria, colostomy Restrictions Weight Bearing Restrictions: No  Therapy/Group: Individual Therapy Alfonse Alpers PT, DPT   02/26/2021, 7:09 AM

## 2021-02-26 NOTE — Progress Notes (Signed)
Speech Language Pathology Daily Session Note  Patient Details  Name: AMAD MAU MRN: [id number] Date of Birth: Nov 18, 1960  Today's Date: 02/26/2021 SLP Individual Time: 0800-0830 SLP Individual Time Calculation (min): 30 min  Short Term Goals: Week 1: SLP Short Term Goal 1 (Week 1): Pt will tolerate Dys 2/thin diet with min s/s dysphagia/aspiration with mod A cues for use of strategies SLP Short Term Goal 2 (Week 1): Pt will trial Dys 3 textures with min s/s dysphagia/aspiration with mod A cues for use of strategies SLP Short Term Goal 3 (Week 1): Pt will understand, recall and utilize compensatory strategies to increase speech intelligibility to 75% with mod A verbal cues SLP Short Term Goal 4 (Week 1): Pt will increase sustained attention to functional tasks to >5 minutes with min A verbal cues SLP Short Term Goal 5 (Week 1): Pt will recall novel and functional information with 70% accuracy provided mod A cues SLP Short Term Goal 6 (Week 1): Pt will complete midly complex problem solving tasks with 50% accuracy provided mod A cues  Skilled Therapeutic Interventions: Skilled SLP intervention focused on cognition. Pt sustained attention for entire 30 min session. He completed speech intelligibility task and demonstrated 70% intelligibility at sentence level with mod A for visual models and verbal cues for overarticulation. Pt answered simple problem solving questions related to rehab schedule with supervision A for attention to details and awareness of errors in time calculations. Pt consumed less than 30% of dys 2 breakfast tray and refused further po trials stating he was not hungry. No overt s/sx of aspiration or penetration with meal or thin liquids. Cont with therapy per plan of care.      Pain Pain Assessment Pain Scale: Faces Faces Pain Scale: No hurt  Therapy/Group: Individual Therapy  [name] Tarick Parenteau 02/26/2021, 3:12 PM

## 2021-02-26 NOTE — Progress Notes (Signed)
Occupational Therapy Session Note  Patient Details  Name: Juan Wheeler MRN: [id number] Date of Birth: [birth year]  Today's Date: 02/26/2021 OT Individual Time: 1020-1100 OT Individual Time Calculation (min): 40 min    Short Term Goals: Week 1:  OT Short Term Goal 1 (Week 1): Pt will don shirt with mod A via hemi-technique. OT Short Term Goal 2 (Week 1): Pt will maintain static sitting balance unsupported with close S for >5 min. OT Short Term Goal 3 (Week 1): Pt will complete STS with mod A in prep for standing ADL.  Skilled Therapeutic Interventions/Progress Updates:    Pt received in bed and consented to OT tx. Pt required min A to stand pivot transfer from EOB to w/c due to decreased coordination and balance. Pt taken down to therapy gym, instructed to stand with quad cane with max A for upright posture. Task downgraded to stand at elevated table top for increased support as pt demo's decreased trunk control when standing and reaching to match cards. Pt demo's multiple LOB's during fwd trunk flexion, but is able to catch himself on table and therapist min-mod A with R knee block. Pt required frequent seated rest breaks due to fatigue. Pt required cuing and assist to increase BOS and properly position feet. After tx, pt handed off to PT.   Therapy Documentation Precautions:  Precautions Precautions: Fall Precaution Comments: dense R hemi, dysarthria, colostomy Restrictions Weight Bearing Restrictions: No Pain: Pain Assessment Pain Score: 0-No pain   Therapy/Group: Individual Therapy  [name] 02/26/2021, 12:01 PM

## 2021-02-27 NOTE — Progress Notes (Signed)
Occupational Therapy Session Note  Patient Details  Name: Juan Wheeler MRN: [id number] Date of Birth: [birth year]  Today's Date: 02/27/2021 OT Individual Time: 1001-1059 OT Individual Time Calculation (min): 58 min    Short Term Goals: Week 1:  OT Short Term Goal 1 (Week 1): Pt will don shirt with mod A via hemi-technique. OT Short Term Goal 2 (Week 1): Pt will maintain static sitting balance unsupported with close S for >5 min. OT Short Term Goal 3 (Week 1): Pt will complete STS with mod A in prep for standing ADL.  Skilled Therapeutic Interventions/Progress Updates:    Pt received in bed and consented to OT tx. Pt req min A to transition from semifowler to sitting EOB, min A to scoot to EOB. Pt req min A to SPT with no device to w/c towards L side. Pt wheeled down to gym, instructed in standing tolerance and balance activity at elevated table top for support. Therapist min-mod A during STS's as pt pushes toward R side when pushing from arm rest to stand. While in standing, pt req mod A for balance with therapist blocking R knee. Pt instructed in large pegboard activity to match reference card requiring multiple seated rest breaks due to fatigue. Pt also required min cuing for peg activity accuracy. While standing, pt needed cuing for proper body mechanics and upright position with poor carryover, relied heavily on therapist and table top for support. Pt then instructed in dynamic sitting activity with card matching for functional reach towards L side to increase unsupported sitting balance and orientation to midline. Pt instructed in towel sliding activity seated at table, instructed in forward flexion with forward sitting to engage core muscles and windshield wiper motion for horizontal ab/adduction for 2x10. After tx, pt helped back to bed and left with all needs met, bed alarm on.   Therapy Documentation Precautions:  Precautions Precautions: Fall Precaution Comments: dense R hemi,  dysarthria, colostomy Restrictions Weight Bearing Restrictions: No Pain: Pain Assessment Pain Scale: Faces Faces Pain Scale: No hurt ADL: ADL Eating: Supervision/safety Where Assessed-Eating: Bed level,Wheelchair Grooming: Supervision/safety Where Assessed-Grooming: Sitting at sink Upper Body Bathing: Moderate assistance Where Assessed-Upper Body Bathing: Wheelchair,Sitting at sink Lower Body Bathing: Maximal assistance Where Assessed-Lower Body Bathing: Sitting at sink,Standing at sink Upper Body Dressing: Maximal assistance Where Assessed-Upper Body Dressing: Sitting at sink Lower Body Dressing: Maximal assistance Where Assessed-Lower Body Dressing: Standing at sink Toileting: Not assessed Toilet Transfer: Not assessed Tub/Shower Transfer: Not assessed [employer] Transfer: Not assessed   Therapy/Group: Individual Therapy  Dagan Heinz 02/27/2021, 10:36 AM

## 2021-02-27 NOTE — Progress Notes (Signed)
Evaluated wound to buttocks. Moderate amount of red drainage to foam dressing. Wound per pt is from radiation tx and has been there sn 2005. Wound is located left buttocks close to anus and there is blue vessel visible. Requesting WOC to evaluate.

## 2021-02-27 NOTE — Progress Notes (Signed)
PROGRESS NOTE   Subjective/Complaints: Patient's chart reviewed- No issues reported overnight Vitals signs stable Hydrocodone does help with his pain; has tylenol allergy so discontinued this  ROS: Patient denies CP, SOB, N/V/D, + pain   Objective:   No results found. No results for input(s): WBC, HGB, HCT, PLT in the last 72 hours. Recent Labs    02/26/21 0512  NA 134*  K 4.4  CL 99  CO2 24  GLUCOSE 98  BUN 28*  CREATININE 1.54*  CALCIUM 9.3    Intake/Output Summary (Last 24 hours) at 02/27/2021 1016 Last data filed at 02/27/2021 0700 Gross per 24 hour  Intake 130 ml  Output 403 ml  Net -273 ml        Physical Exam: Vital Signs Blood pressure 112/73, pulse 77, temperature 97.8 F (36.6 C), temperature source Oral, resp. rate 17, weight 63.8 kg, SpO2 90 %. Gen: no distress, normal appearing HEENT: oral mucosa pink and moist, NCAT Cardio: Reg rate Chest: normal effort, normal rate of breathing Abd: soft, non-distended Ext: no edema Psych: pleasant, normal affect Skin: intact  Neuro: alert, oriented. Follows basic commands. Feeding self breakfast. Dysarthric but language intact. Left gaze preference. Tending to all items on tray.  LUE and LLE 5/5. RUE 1-2/5, RLE trace hip/knee extensor synergy  0/5  distal.Musculoskeletal: Full ROM, No pain with AROM or PROM in the neck, trunk, or extremities. Posture appropriate     Assessment/Plan: 1. Functional deficits which require 3+ hours per day of interdisciplinary therapy in a comprehensive inpatient rehab setting.  Physiatrist is providing close team supervision and 24 hour management of active medical problems listed below.  Physiatrist and rehab team continue to assess barriers to discharge/monitor patient progress toward functional and medical goals  Care Tool:  Bathing    Body parts bathed by patient: Right arm,Chest,Abdomen,Right upper leg,Left upper  leg,Face   Body parts bathed by helper: Left arm     Bathing assist Assist Level: Moderate Assistance - Patient 50 - 74%     Upper Body Dressing/Undressing Upper body dressing   What is the patient wearing?: Pull over shirt    Upper body assist Assist Level: Moderate Assistance - Patient 50 - 74%    Lower Body Dressing/Undressing Lower body dressing      What is the patient wearing?: Pants     Lower body assist Assist for lower body dressing: Maximal Assistance - Patient 25 - 49%     Toileting Toileting    Toileting assist Assist for toileting: Moderate Assistance - Patient 50 - 74% Assistive Device Comment: ueinal   Transfers Chair/bed transfer  Transfers assist     Chair/bed transfer assist level: Moderate Assistance - Patient 50 - 74%     Locomotion Ambulation   Ambulation assist   Ambulation activity did not occur: Safety/medical concerns  Assist level: 2 helpers Assistive device: Other (comment) (L handrail) Max distance: 89ft   Walk 10 feet activity   Assist  Walk 10 feet activity did not occur: Safety/medical concerns  Assist level: 2 helpers Assistive device: Other (comment) (L handrail)   Walk 50 feet activity   Assist Walk 50 feet with 2 turns  activity did not occur: Safety/medical concerns         Walk 150 feet activity   Assist Walk 150 feet activity did not occur: Safety/medical concerns         Walk 10 feet on uneven surface  activity   Assist Walk 10 feet on uneven surfaces activity did not occur: Safety/medical concerns         Wheelchair     Assist Will patient use wheelchair at discharge?: Yes Type of Wheelchair: Manual    Wheelchair assist level: Supervision/Verbal cueing Max wheelchair distance: 150    Wheelchair 50 feet with 2 turns activity    Assist        Assist Level: Supervision/Verbal cueing   Wheelchair 150 feet activity     Assist      Assist Level: Supervision/Verbal  cueing   Blood pressure 112/73, pulse 77, temperature 97.8 F (36.6 C), temperature source Oral, resp. rate 17, weight 63.8 kg, SpO2 90 %.  Medical Problem List and Plan: 1.  Dysarthria/right hemiparesis with left gaze preference secondary to multifocal acute ischemia within the bilateral, left greater than right multiple vascular territories due to cardioembolic source             -patient may shower           -Continue CIR.  D/C date 6/15 2.  Antithrombotics: -DVT/anticoagulation: Pradaxa for Afib             -antiplatelet therapy: Aspirin 81 mg daily 3. Pain Management: Continue Hydrocodone as needed. D/c tylenol given allergy (palpitations) 4. Mood: Provide emotional support             -antipsychotic agents: N/A 5. Neuropsych: This patient is capable of making decisions on his own behalf. 6. Skin/Wound Care: Routine skin checks 7. Hyponatremia: monitor CMP weekly.   -appears to have good appetite 8.  Hypertension.  Continue Toprol-XL 50 mg daily.  BP soft- continue to monitor.  Vitals:   02/26/21 1938 02/27/21 0422  BP: 110/80 112/73  Pulse: 74 77  Resp: 16 17  Temp: 97.8 F (36.6 C) 97.8 F (36.6 C)  SpO2: 92% 90%  controlled 6/2 9.  Hyperlipidemia: Zocor 10.  History of squamous cell carcinoma of the lung Follow-up outpatient 11.  Cardiomyopathy with history of bilateral pulmonary emboli.  Continue Pradaxa 12.  Atrial fibrillation.  Cardiac rate in 60s- 80s             Continue to monitor heart rate with increased physical activity 13.  History of tobacco abuse.  Counseling 14.  GERD: Protonix drop with meds as below 15.  Thrombocytopenia Resolved  16.  ELevated Hgb- may have some hemoconcentration with elevated BUN/Creat enc po fluid, baseline 15-16 17.  Hx rectal adenoCA with colostomy from 2017- will have issues managing this due to hemiplegia 18. AKI: encourage hydration. Repeat creatinine on Monday 19.  Poor appetite, pt states it is due to food sticking in his  throat, cont to work with SLP   LOS: 6 days A FACE TO FACE EVALUATION WAS PERFORMED  Martha Clan P Koray Soter 02/27/2021, 10:16 AM

## 2021-02-27 NOTE — Progress Notes (Signed)
Speech Language Pathology Daily Session Note  Patient Details  Name: Juan Wheeler MRN: [id number] Date of Birth: June 07, 1961  Today's Date: 02/27/2021 SLP Individual Time: 0730-0830 SLP Individual Time Calculation (min): 60 min  Short Term Goals: Week 1: SLP Short Term Goal 1 (Week 1): Pt will tolerate Dys 2/thin diet with min s/s dysphagia/aspiration with mod A cues for use of strategies SLP Short Term Goal 2 (Week 1): Pt will trial Dys 3 textures with min s/s dysphagia/aspiration with mod A cues for use of strategies SLP Short Term Goal 3 (Week 1): Pt will understand, recall and utilize compensatory strategies to increase speech intelligibility to 75% with mod A verbal cues SLP Short Term Goal 4 (Week 1): Pt will increase sustained attention to functional tasks to >5 minutes with min A verbal cues SLP Short Term Goal 5 (Week 1): Pt will recall novel and functional information with 70% accuracy provided mod A cues SLP Short Term Goal 6 (Week 1): Pt will complete midly complex problem solving tasks with 50% accuracy provided mod A cues  Skilled Therapeutic Interventions: Skilled SLP intervention focused on cognition and dysphagia. Pt seen with dys 3 texture during meal and thin liquids. Coughing before swallow noted x1 with dys 3 textures. Pt demonstrated use of swallow strategies posted in room with min A verbal cues. He tolerated thin liquids and pills whole with thin liquid with no overt s/sx of aspiration or penetration.    Math calculations and written word problems completed with increased time for processing and min A verbal cues. He completed  Dollar and coin calculations with increased time and mod A verbal and visual cues. Pt demonstrated awareness of increased time needed to complete mental calculations.  Recommend trials next session with dys 3 texture and upgrade if tolerating.      Pain Pain Assessment Pain Scale: Faces Pain Score: Asleep Faces Pain Scale: No  hurt  Therapy/Group: Individual Therapy  [name] [name] 02/27/2021, 8:28 AM

## 2021-02-27 NOTE — Progress Notes (Signed)
Physical Therapy Session Note  Patient Details  Name: Juan Wheeler MRN: [id number] Date of Birth: 1961-02-13  Today's Date: 02/27/2021 PT Individual Time: 3704-8889 PT Individual Time Calculation (min): 29 min   Short Term Goals: Week 1:  PT Short Term Goal 1 (Week 1): Pt will perform bed mobility with mod assist PT Short Term Goal 2 (Week 1): Pt will transfer to [facility] with mod assist PT Short Term Goal 3 (Week 1): Pt will ambulate with mod assist +2 for safety and LRAD x 21ft PT Short Term Goal 4 (Week 1): Pt will propell WC with supervisoin assist >153ft  Skilled Therapeutic Interventions/Progress Updates: Pt presents supine in bed and agreeable to therapy.  Pt transferred sup to sit using siderail and mod A.  Pt sat EOB holding rail for total A donning shoes, although able to maintain sitting short periods w/o UE.  Pt sat EOB w/ facilitation at R elbow, hand and shoulder for weightshifting side to side.  Pt performed sit to stand w/ mod A and then SPT to w/c.  Pt wheeled to sink w/ mod A to maintain direction using LUE and LE.  Pt performed multiple sit to stand transfers at sink for brushing teeth and washing face, blocking R knee and mod A.  Pt performed multiple sit to stand transfers w/ mod A at hand rail in hallway , then performing partial squats w/ assist at R knee to maintain slow eccentric movement.  Pt performed SPT w/c > bed w/ mod A and then to side lying on left w/ mod to min A for LEs and then rolled to supine.  Bed alarm on and all needs in reach.     Therapy Documentation Precautions:  Precautions Precautions: Fall Precaution Comments: dense R hemi, dysarthria, colostomy Restrictions Weight Bearing Restrictions: No General:   Vital Signs:   Pain:0/10 Pain Assessment Pain Scale: Faces Faces Pain Scale: No hurt    Therapy/Group: Individual Therapy  Ladoris Gene 02/27/2021, 9:47 AM

## 2021-02-27 NOTE — Progress Notes (Signed)
Physical Therapy Session Note  Patient Details  Name: Juan Wheeler MRN: [id number] Date of Birth: 05-31-1961  Today's Date: 02/27/2021 PT Individual Time: 6295-2841 PT Individual Time Calculation (min): 54 min   Short Term Goals: Week 1:  PT Short Term Goal 1 (Week 1): Pt will perform bed mobility with mod assist PT Short Term Goal 2 (Week 1): Pt will transfer to [facility] with mod assist PT Short Term Goal 3 (Week 1): Pt will ambulate with mod assist +2 for safety and LRAD x 12ft PT Short Term Goal 4 (Week 1): Pt will propell WC with supervisoin assist >162ft  Skilled Therapeutic Interventions/Progress Updates:   Received pt supine in bed, pt agreeable to PT treatment, and denied any pain during session. Session with emphasis on functional mobility/transfers, generalized strengthening, dynamic standing balance/coordination, gait training, and improved activity tolerance. Pt reported urge to urinate and required min A to doff pants on R side and total A to place urinal. Pt able to void in urinal with supervision. Noted blood from sacral wound in brief. RN notified and doffed soiled brief and removed dirty dressing. Applied new foam dressing and donned brief with max A. Pt able to roll L/R with supervision and use of bedrails and able to bridge hips to assist with dressing with manual assist to achieve hooklying position on R. Supine<>sitting EOB with min/mod A with total A to manage RLE. Donned shoes with max A and pt transferred stand<>pivot bed<>WC to L with min A. Pt performed WC mobility 137ft using R hemi technique and supervision to [facility] elevators. Pt transported remainder of way to 4W therapy gym in [facility] total A for time management purposes. Donned R DF ace wrap with total A and pt transferred sit<>stand with L railing in hallway with RUE supported around therapist with light mod A. Pt ambulated 59ft x 2 trials with L handrail and mod A +2 for close WC follow using mirror for visual feedback. Pt  demonstrated improvements in R knee control with no hyperextension noted and increased stride length compared to yesterday. Pt required 1 extended seated rest break in between ambulation bouts due to fatigue. Pt then performed 2x10 squats standing at L railing with min A overall with therapist guarding R knee but no buckling noted; emphasis on quad control and R lateral weight shifting. Pt transported back to room in [facility] total A and requested to return to bed. WC<>bed stand<>pivot with min A and doffed shoes with total A. Sit<>supine with mod A for RLE management. Concluded session with pt supine in bed, needs within reach, and bed alarm on. Provided pt with fresh drink and snack (cleared by RN).   Therapy Documentation Precautions:  Precautions Precautions: Fall Precaution Comments: dense R hemi, dysarthria, colostomy Restrictions Weight Bearing Restrictions: No  Therapy/Group: Individual Therapy Alfonse Alpers PT, DPT   02/27/2021, 7:19 AM

## 2021-02-28 MED ORDER — ZINC OXIDE 12.8 % EX OINT
TOPICAL_OINTMENT | CUTANEOUS | Status: DC | PRN
  Filled 2021-02-28: qty 56.7

## 2021-02-28 NOTE — Consult Note (Signed)
[place] Nurse Consult Note: Pt receiving care in [place] Patient with hx of rectal adenocarcinoma Reason for Consult: "Area to left of anus, mod amount of red fluid and blue vessel exposed.  Wound type: Gel spacer placed during radiation therapy for rectal cancer with blue extension hanging out of the rectum. There is a moderate amount of sanguinous drainage with this. Patient states that he has had it for years. I have ordered some triple paste to be used PRN if any breakdown starts because of the moisture from the drainage.  Continue to place foam dressings to the area and change PRN soilage.   Monitor the wound area(s) for worsening of condition such as: Signs/symptoms of infection, increase in size, development of or worsening of odor, development of pain, or increased pain at the affected locations.   Notify the medical team if any of these develop.  Thank you for the consult. [place] nurse will not follow at this time.   Please re-consult the [place] team if needed.  Cathlean Marseilles Tamala Julian, MSN, RN, [place], Lysle Pearl, [facility] Wound Treatment Associate Pager [phone]

## 2021-02-28 NOTE — Progress Notes (Signed)
Occupational Therapy Session Note  Patient Details  Name: Juan Wheeler MRN: [id number] Date of Birth: Dec 14, 1960  Today's Date: 02/28/2021 OT Individual Time: 2725-3664 OT Individual Time Calculation (min): 39 min    Short Term Goals: Week 1:  OT Short Term Goal 1 (Week 1): Pt will don shirt with mod A via hemi-technique. OT Short Term Goal 2 (Week 1): Pt will maintain static sitting balance unsupported with close S for >5 min. OT Short Term Goal 3 (Week 1): Pt will complete STS with mod A in prep for standing ADL.  Skilled Therapeutic Interventions/Progress Updates:    Treatment session with focus on functional mobility, transfers, and sit > stand. Pt received semi-reclined in bed agreeable to therapy session.  Pt completed bed mobility with min assist to come to sitting EOB, requiring assistance to advance RLE.  Pt completed squat pivot transfer to R with mod assist.  Pt transported outside for change of scenery and mood improvement. Engaged in sit > stand with min assist, therapist providing blocking and stabilization at R knee. Pt requested return inside.  Engaged in sit > stand at high-low table to allow UE support in standing. Engaged in mini-squats in with UE support to challenge balance and sit > stand while therapist providing blocking at R knee for stability during transitional movements.  Therapist educated pt on hemi-technique to propel w/c with initial min assist fading to supervision.  Pt transferred back to bed min assist to L.  Pt left semi-reclined in bed with all needs in reach.  Therapy Documentation Precautions:  Precautions Precautions: Fall Precaution Comments: dense R hemi, dysarthria, colostomy Restrictions Weight Bearing Restrictions: No Vital Signs: Therapy Vitals Temp: 97.8 F (36.6 C) Temp Source: Oral Pulse Rate: 93 Resp: 16 BP: 101/68 Patient Position (if appropriate): Lying Oxygen Therapy SpO2: 95 % O2 Device: Room Air Pain:  Pt with no c/o  pain   Therapy/Group: Individual Therapy  Simonne Come 02/28/2021, 3:01 PM

## 2021-02-28 NOTE — Progress Notes (Signed)
Physical Therapy Weekly Progress Note  Patient Details  Name: Juan Wheeler MRN: [id number] Date of Birth: [birth year]  Beginning of progress report period: Feb 22, 2021 End of progress report period: February 28, 2021  Today's Date: 02/28/2021 PT Individual Time: 0800-0853 PT Individual Time Calculation (min): 53 min   Patient has met 4 of 4 short term goals. Pt demonstrates steady progress towards long term goals. Pt is currently able to roll L/R with supervision, transfer supine<>sit with mod A, and transfer sit<>supine with min A. Pt able to perform stand/squat<>pivot transfers with min/mod A overall, ambulate up to 63f with L handrail and mod A +2 for close WC follow with assist to advance and stabilize R knee, and perform WC mobility up to 1576fwith supervision using R hemi technique. Pt is limited by fatigue and sacral pain but remains motivated to participate in therapy sessions.   Patient continues to demonstrate the following deficits muscle weakness, decreased cardiorespiratoy endurance, impaired timing and sequencing, abnormal tone, unbalanced muscle activation, decreased coordination and decreased motor planning, decreased awareness, decreased problem solving, decreased memory and delayed processing and decreased sitting balance, decreased standing balance, decreased postural control, hemiplegia and decreased balance strategies and therefore will continue to benefit from skilled PT intervention to increase functional independence with mobility.  Patient progressing toward long term goals..  Continue plan of care.  PT Short Term Goals Week 1:  PT Short Term Goal 1 (Week 1): Pt will perform bed mobility with mod assist PT Short Term Goal 1 - Progress (Week 1): Met PT Short Term Goal 2 (Week 1): [facility] Short Term Goal 2 - Progress (Week 1): Met PT Short Term Goal 3 (Week 1): Pt will ambulate with mod assist +2 for safety and LRAD x 3061fT Short Term  Goal 3 - Progress (Week 1): Met PT Short Term Goal 4 (Week 1): Pt will propell WC with supervisoin assist >[id number] Short Term Goal 4 - Progress (Week 1): Met Week 2:  PT Short Term Goal 1 (Week 2): Pt will ambulate 15ft20fh max A of 1 PT Short Term Goal 2 (Week 2): Pt will initiate stair navigation PT Short Term Goal 3 (Week 2): Pt initate understanding of managing WC parts  Skilled Therapeutic Interventions/Progress Updates:  Ambulation/gait training;Community reintegration;DME/adaptive equipment instruction;Neuromuscular re-education;Psychosocial support;Stair training;UE/LE Strength taining/ROM;Wheelchair propulsion/positioning;UE/LE Coordination activities;Therapeutic Activities;Skin care/wound management;Pain management;Functional electrical stimulation;Discharge planning;Balance/vestibular training;Disease management/prevention;Cognitive remediation/compensation;Functional mobility training;Patient/family education;Splinting/orthotics;Therapeutic Exercise;Visual/perceptual remediation/compensation   Today's Interventions: Received pt supine in bed, pt agreeable to therapy, but reported not sleeping well last night due to sacral pain and c/o continued pain this morning. RN notified and present to administer medications. MD present for morning rounds and PT assisted in positioning pt on R side for MD to inspect wound. Session with emphasis on dressing, functional mobility/transfers, generalized strengthening, dynamic standing balance/coordination, NMR, and improved activity tolerance. Provided pt with scrub pants and donned pants in supine with total A to thread RLE through and min A to thread LLE through. Pt able to bridge hips and pull pants over hips with min/mod A as pt unable to use RUE to assist. Pt required total A to position RLE in hooklying. Wound care RN then present requesting to inspect wound; assisted in getting pt in R sidelying position again. Pt transferred supine<>sitting EOB with  mod A for RLE management only and doffed dirty shirt and donned clean one with mod A. Donned shoes sitting EOB with  max A and transferred bed<>WC stand<>pivot to L with min A. Pt transported to 4W therapy gym in [facility] total A for time management purposes. Sit<>stand in hallway with L rail and min/mod A x 3 trials and worked on dynamic standing balance, R lateral weight shifting, and R knee control hooking/unhooking horseshoes to/from railing to top R corner of mirror x 2 trials with therapist guarding R knee but no buckling noted. Transitioned to squats x10 with LUE support on handrail and mod A; pt with increased R knee hyperextension with difficulty correcting. Pt reported having a "rough morning" ultimately requesting to return to room. Pt transported back to room in [facility] total A and requested to return to bed despite OT coming in 30 minutes. Squat<>pivot WC<>bed with min A and doffed shoes with total A. Sit<>supine with min A for RLE management. Concluded session with pt supine in bed asleep, needs within reach, and bed alarm on.   Therapy Documentation Precautions:  Precautions Precautions: Fall Precaution Comments: dense R hemi, dysarthria, colostomy Restrictions Weight Bearing Restrictions: No  Therapy/Group: Individual Therapy Alfonse Alpers PT, DPT  02/28/2021, 7:21 AM

## 2021-02-28 NOTE — Progress Notes (Signed)
Occupational Therapy Session Note  Patient Details  Name: Juan Wheeler MRN: [id number] Date of Birth: 12-30-1960  Today's Date: 02/28/2021 OT Individual Time: 2035-5974 OT Individual Time Calculation (min): 30 min  and Today's Date: 02/28/2021 OT Missed Time: 15 Minutes Missed Time Reason: Patient fatigue   Short Term Goals: Week 1:  OT Short Term Goal 1 (Week 1): Pt will don shirt with mod A via hemi-technique. OT Short Term Goal 2 (Week 1): Pt will maintain static sitting balance unsupported with close S for >5 min. OT Short Term Goal 3 (Week 1): Pt will complete STS with mod A in prep for standing ADL.  Skilled Therapeutic Interventions/Progress Updates:    Pt greeted semi-reclined in bed asleep. Pt was easy to wake, but reported being very fatigued from earlier therapy sessions. Pt requests to stay in the bed, but agreeable to shoulder exercises. Pt brought into sidelying with min A. R UE NMR in gravity eliminated sidelying position with joint input through elbow/wrist to bring pt through full ROM. Pt completed 3 sets of 20 shoulder FF and elbow flex/ext with OT assist. Pt with trace shoulder activation within exercises. Also worked on functional grasp/release with yellow foam block. Pt missed 15 minutes of OT treatment session 2/2 fatigue. Pt left semi-reclined in bed at end of session with bed alarm on, call bell in reach, and needs met.    Therapy Documentation Precautions:  Precautions Precautions: Fall Precaution Comments: dense R hemi, dysarthria, colostomy Restrictions Weight Bearing Restrictions: No General: General OT Amount of Missed Time: 15 Minutes Pain:   denies pain  Therapy/Group: Individual Therapy  Valma Cava 02/28/2021, 1:10 PM

## 2021-02-28 NOTE — Progress Notes (Signed)
PROGRESS NOTE   Subjective/Complaints: "butt" still sore. "has been for years". Otherwise he feels ok, fatigued at tiems.   ROS: Patient denies fever, rash, sore throat, blurred vision, nausea, vomiting, diarrhea, cough, shortness of breath or chest pain, headache, or mood change.    Objective:   No results found. No results for input(s): WBC, HGB, HCT, PLT in the last 72 hours. Recent Labs    02/26/21 0512  NA 134*  K 4.4  CL 99  CO2 24  GLUCOSE 98  BUN 28*  CREATININE 1.54*  CALCIUM 9.3    Intake/Output Summary (Last 24 hours) at 02/28/2021 1459 Last data filed at 02/28/2021 0530 Gross per 24 hour  Intake 120 ml  Output 800 ml  Net -680 ml        Physical Exam: Vital Signs Blood pressure 101/68, pulse 93, temperature 97.8 F (36.6 C), temperature source Oral, resp. rate 16, weight 63.8 kg, SpO2 95 %. Constitutional: No distress . Vital signs reviewed. HEENT: EOMI, oral membranes moist Neck: supple Cardiovascular: RRR without murmur. No JVD    Respiratory/Chest: CTA Bilaterally without wheezes or rales. Normal effort    GI/Abdomen: BS +, non-tender, non-distended, ostomy Ext: no clubbing, cyanosis, or edema Psych: pleasant and cooperative Skin: in his sacram/perinal there is some sort of blue, rubber retention-like material with associated slight serosanguinous drainage Neuro: alert, oriented. Follows basic commands. Feeding self breakfast. Dysarthric but language intact. Left gaze preference. Tending to all items on tray.  LUE and LLE 5/5. RUE 1-2/5, RLE trace hip/knee extensor synergy  0/5  Distal .Musculoskeletal: Full ROM, No pain with AROM or PROM in the neck, trunk, or extremities. Posture appropriate     Assessment/Plan: 1. Functional deficits which require 3+ hours per day of interdisciplinary therapy in a comprehensive inpatient rehab setting.  Physiatrist is providing close team supervision and 24  hour management of active medical problems listed below.  Physiatrist and rehab team continue to assess barriers to discharge/monitor patient progress toward functional and medical goals  Care Tool:  Bathing    Body parts bathed by patient: Right arm,Chest,Abdomen,Right upper leg,Left upper leg,Face   Body parts bathed by helper: Left arm     Bathing assist Assist Level: Moderate Assistance - Patient 50 - 74%     Upper Body Dressing/Undressing Upper body dressing   What is the patient wearing?: Pull over shirt    Upper body assist Assist Level: Moderate Assistance - Patient 50 - 74%    Lower Body Dressing/Undressing Lower body dressing      What is the patient wearing?: Pants     Lower body assist Assist for lower body dressing: Maximal Assistance - Patient 25 - 49%     Toileting Toileting    Toileting assist Assist for toileting: Moderate Assistance - Patient 50 - 74% Assistive Device Comment: ueinal   Transfers Chair/bed transfer  Transfers assist     Chair/bed transfer assist level: Moderate Assistance - Patient 50 - 74%     Locomotion Ambulation   Ambulation assist   Ambulation activity did not occur: Safety/medical concerns  Assist level: 2 helpers Assistive device: Other (comment) (L handrail) Max distance: 16ft  Walk 10 feet activity   Assist  Walk 10 feet activity did not occur: Safety/medical concerns  Assist level: 2 helpers Assistive device: Other (comment) (L handrail)   Walk 50 feet activity   Assist Walk 50 feet with 2 turns activity did not occur: Safety/medical concerns         Walk 150 feet activity   Assist Walk 150 feet activity did not occur: Safety/medical concerns         Walk 10 feet on uneven surface  activity   Assist Walk 10 feet on uneven surfaces activity did not occur: Safety/medical concerns         Wheelchair     Assist Will patient use wheelchair at discharge?: Yes Type of  Wheelchair: Manual    Wheelchair assist level: Supervision/Verbal cueing Max wheelchair distance: 150    Wheelchair 50 feet with 2 turns activity    Assist        Assist Level: Supervision/Verbal cueing   Wheelchair 150 feet activity     Assist      Assist Level: Supervision/Verbal cueing   Blood pressure 101/68, pulse 93, temperature 97.8 F (36.6 C), temperature source Oral, resp. rate 16, weight 63.8 kg, SpO2 95 %.  Medical Problem List and Plan: 1.  Dysarthria/right hemiparesis with left gaze preference secondary to multifocal acute ischemia within the bilateral, left greater than right multiple vascular territories due to cardioembolic source             -patient may shower           -Continue CIR.  D/C date 6/15 2.  Antithrombotics: -DVT/anticoagulation: Pradaxa for Afib             -antiplatelet therapy: Aspirin 81 mg daily 3. Pain Management: Continue Hydrocodone as needed. D/c tylenol given allergy (palpitations) 4. Mood: Provide emotional support             -antipsychotic agents: N/A 5. Neuropsych: This patient is capable of making decisions on his own behalf. 6. Skin/Wound Care:  Pt with hx of rectal cancer with radiation. Has had chronic wound. Has exposed retention material with slight s/s drainage.   -appreciate WOC RN follow up  -continue local care, keep area clean. Triple paste per [place] RN 7. Hyponatremia: monitor CMP weekly.   -appears to have good appetite 8.  Hypertension.  Continue Toprol-XL 50 mg daily.  BP soft- continue to monitor.  Vitals:   02/28/21 0330 02/28/21 1321  BP: 112/76 101/68  Pulse: 81 93  Resp: 18 16  Temp: 97.8 F (36.6 C) 97.8 F (36.6 C)  SpO2: 93% 95%  controlled 6/3 9.  Hyperlipidemia: Zocor 10.  History of squamous cell carcinoma of the lung Follow-up outpatient 11.  Cardiomyopathy with history of bilateral pulmonary emboli.  Continue Pradaxa 12.  Atrial fibrillation.  Cardiac rate in 60s- 80s              Continue to monitor heart rate with increased physical activity 13.  History of tobacco abuse.  Counseling 14.  GERD: Protonix drop with meds as below 15.  Thrombocytopenia   Resolved  16.  ELevated Hgb- may have some hemoconcentration with elevated BUN/Creat enc po fluid, baseline 15-16 17.  Hx rectal adenoCA with colostomy from 2017- will have issues managing this due to hemiplegia (see above also) 18. AKI: encourage hydration. Repeat creatinine on Monday 19.  Poor appetite, pt states it is due to food sticking in his throat, cont to work with SLP  LOS: 7 days A FACE TO FACE EVALUATION WAS PERFORMED  Meredith Staggers 02/28/2021, 2:59 PM

## 2021-02-28 NOTE — Progress Notes (Signed)
Occupational Therapy Session Note  Patient Details  Name: Juan Wheeler MRN: [id number] Date of Birth: Mar 06, 1961  Today's Date: 02/28/2021 OT Individual Time: 4536-4680 OT Individual Time Calculation (min): 71 min    Short Term Goals: Week 1:  OT Short Term Goal 1 (Week 1): Pt will don shirt with mod A via hemi-technique. OT Short Term Goal 2 (Week 1): Pt will maintain static sitting balance unsupported with close S for >5 min. OT Short Term Goal 3 (Week 1): Pt will complete STS with mod A in prep for standing ADL.  Skilled Therapeutic Interventions/Progress Updates:    Treatment session with focus on self-care retraining, trunk control, sit > stand, dynamic standing balance, and RUE NMR.  Pt received supine in bed reporting pain in "backside" premedicated and fatigue from minimal sleep overnight, however pt still agreeable to therapy session.  Pt completed stand pivot transfer to R mod assist, therapist facilitating weight shift and stepping with RLE.  Pt completed oral care seated at sink with setup to open items.  Pt completing mini sit > stand to spit in to sink.  Engaged in sit > stand with min-mod assist with UE support on w/c. Engaged in towel glides in standing with mod assist and blocking at R knee due to instability.  Pt reports fatigue, completed additional towel glides in standing with therapist providing support at elbow.  Engaged in [place] in [place] with use of powder board and hand skate while completing flexion/extension.  Engaged in core stabilization in supine with ball between pt knees to engage core and focus on sustaining positioning and control of RLE during bridging and hip rotation. Pt returned to room and transferred to bed min assist to L.  Pt left semi-reclined in bed with all needs in reach.  Therapy Documentation Precautions:  Precautions Precautions: Fall Precaution Comments: dense R hemi, dysarthria, colostomy Restrictions Weight Bearing Restrictions:  No General:   Vital Signs:   Pain: Pain Assessment Pain Scale: 0-10 Pain Score: 8  Pain Type: Chronic pain Pain Location: Back Pain Orientation: Lower Pain Descriptors / Indicators: Aching;Discomfort Pain Intervention(s): Medication (See eMAR) ADL: ADL Eating: Supervision/safety Where Assessed-Eating: Bed level,Wheelchair Grooming: Supervision/safety Where Assessed-Grooming: Sitting at sink Upper Body Bathing: Moderate assistance Where Assessed-Upper Body Bathing: Wheelchair,Sitting at sink Lower Body Bathing: Maximal assistance Where Assessed-Lower Body Bathing: Sitting at sink,Standing at sink Upper Body Dressing: Maximal assistance Where Assessed-Upper Body Dressing: Sitting at sink Lower Body Dressing: Maximal assistance Where Assessed-Lower Body Dressing: Standing at sink Toileting: Not assessed Toilet Transfer: Not assessed Tub/Shower Transfer: Not assessed Gaffer Transfer: Not assessed Vision   Perception    Praxis   Exercises:   Other Treatments:     Therapy/Group: Individual Therapy  Simonne Come 02/28/2021, 10:41 AM

## 2021-03-01 MED ORDER — GABAPENTIN 600 MG PO TABS
300.0000 mg | ORAL_TABLET | Freq: Every day | ORAL | Status: DC
  Administered 2021-03-01 – 2021-03-11 (×11): 300 mg via ORAL
  Filled 2021-03-01 (×11): qty 1

## 2021-03-01 NOTE — Progress Notes (Signed)
Occupational Therapy Session Note  Patient Details  Name: Juan Wheeler MRN: [id number] Date of Birth: 08-01-1961  Today's Date: 03/01/2021 OT Individual Time: 0907-1000 OT Individual Time Calculation (min): 53 min    Short Term Goals: Week 1:  OT Short Term Goal 1 (Week 1): Pt will don shirt with mod A via hemi-technique. OT Short Term Goal 2 (Week 1): Pt will maintain static sitting balance unsupported with close S for >5 min. OT Short Term Goal 3 (Week 1): Pt will complete STS with mod A in prep for standing ADL.  Skilled Therapeutic Interventions/Progress Updates:    1:1. Pt received in bed agreeable to OT. Pt with pain in buttocks and rest provided PRN. RN already delivered meds. Provided handout for positioning in sidelying to promote wound healing. Pt completes sup>sit EOB with MIN A and sits EOB with S-MIN A during dynamic balance tasks throughout ADL. Pt requires MIN HOH A to incorporate RUE into ADLs and MIN A to don new shirt (S to doff). Pt threads BL Einto pants with A to place into figure 4 and MIN A to thread second LE. Multiple sit to stands at EOB for bladder void and dressing at MIN A. Exited session with pt seated in w/c, exit alarm on and call light in reach   Therapy Documentation Precautions:  Precautions Precautions: Fall Precaution Comments: dense R hemi, dysarthria, colostomy Restrictions Weight Bearing Restrictions: No General:   Vital Signs: Therapy Vitals Temp: 98.3 F (36.8 C) Temp Source: Oral Pulse Rate: 83 Resp: 16 BP: 101/67 Patient Position (if appropriate): Lying Oxygen Therapy SpO2: 98 % O2 Device: Room Air Pain: Pain Assessment Pain Scale: 0-10 Pain Score: 8  Pain Type: Acute pain Pain Location: Buttocks Pain Orientation: Mid Pain Descriptors / Indicators: Discomfort Pain Frequency: Intermittent Pain Onset: On-going Patients Stated Pain Goal: 0 Pain Intervention(s): Medication (See eMAR) ADL: ADL Eating:  Supervision/safety Where Assessed-Eating: Bed level,Wheelchair Grooming: Supervision/safety Where Assessed-Grooming: Sitting at sink Upper Body Bathing: Moderate assistance Where Assessed-Upper Body Bathing: Wheelchair,Sitting at sink Lower Body Bathing: Maximal assistance Where Assessed-Lower Body Bathing: Sitting at sink,Standing at sink Upper Body Dressing: Maximal assistance Where Assessed-Upper Body Dressing: Sitting at sink Lower Body Dressing: Maximal assistance Where Assessed-Lower Body Dressing: Standing at sink Toileting: Not assessed Toilet Transfer: Not assessed Tub/Shower Transfer: Not assessed Gaffer Transfer: Not assessed Vision   Perception    Praxis   Exercises:   Other Treatments:     Therapy/Group: Individual Therapy  Tonny Branch 03/01/2021, 6:47 AM

## 2021-03-01 NOTE — Progress Notes (Signed)
Occupational Therapy Session Note  Patient Details  Name: Juan Wheeler MRN: [id number] Date of Birth: 1960-12-17  Today's Date: 03/01/2021 OT Individual Time: 4665-9935 OT Individual Time Calculation (min): 54 min  and Today's Date: 03/01/2021 OT Missed Time: 21 Minutes Missed Time Reason: Patient fatigue;Pain   Short Term Goals: Week 1:  OT Short Term Goal 1 (Week 1): Pt will don shirt with mod A via hemi-technique. OT Short Term Goal 2 (Week 1): Pt will maintain static sitting balance unsupported with close S for >5 min. OT Short Term Goal 3 (Week 1): Pt will complete STS with mod A in prep for standing ADL.  Skilled Therapeutic Interventions/Progress Updates:    Treatment session with focus on functional transfers, sitting balance, and RUE NMR.  Pt received in sidelying reporting fatigue but ultimately agreeable to attempting to participate in therapy to tolerance.  Pt completed bed mobility with min assist.  Completed stand pivot transfer to R min assist.  Attempted to engage shoulder elevation and depression in sitting with large therapy ball for improved activation.  Pt unable to active shoulder movements with compensatory trunk movements.  Therapist providing tactile cues and hand over hand to facilitate ROM while decreasing compensatory movements.  Therapist educated pt on e-stim with pt in agreement to attempt. NMES applied to supraspinatus and middle deltoid to help approximate shoulder joint to reduce risk of sublux and pain while activating shoulder elevation.  Pt maintaining static sitting balance with distant supervision during and in between UE NMR.  Following stimulation engaged in carryover with shoulder elevation/depression and scapular retraction.  Therapist utilizing mirror for visual feedback of movements and providing mod facilitation from increased movement.  Pt reporting fatigue and pain in buttocks, therefore returned to room early.  Pt transferred back to bed min assist  stand pivot and assisted with positioning RLE in to bed.  Ratio 1:3 Rate 35 pps Waveform- Asymmetric Ramp 1.0 Pulse 300 Intensity- 25 Duration -   10 min  Pt with no report of pain in shoulder at the beginning or end of session.  No adverse reactions after treatment and is skin intact.    Therapy Documentation Precautions:  Precautions Precautions: Fall Precaution Comments: dense R hemi, dysarthria, colostomy Restrictions Weight Bearing Restrictions: No Pain: Pain Assessment Pain Scale: 0-10 Pain Score: 7  Pain Location: Buttocks   Therapy/Group: Individual Therapy  Simonne Come 03/01/2021, 2:40 PM

## 2021-03-01 NOTE — Progress Notes (Signed)
PROGRESS NOTE   Subjective/Complaints: C/o pain over rectum, chronic, appreciate wound care consult.  Not sleeping well due to the pain. Discussed starting Gabapentin 300mg  at night and he and wife agreeable +colostomy  ROS: Patient denies fever, rash, sore throat, blurred vision, nausea, vomiting, diarrhea, cough, shortness of breath or chest pain, headache, or mood change, +insomnia   Objective:   No results found. No results for input(s): WBC, HGB, HCT, PLT in the last 72 hours. No results for input(s): NA, K, CL, CO2, GLUCOSE, BUN, CREATININE, CALCIUM in the last 72 hours.  Intake/Output Summary (Last 24 hours) at 03/01/2021 1617 Last data filed at 03/01/2021 1435 Gross per 24 hour  Intake 771 ml  Output 565 ml  Net 206 ml        Physical Exam: Vital Signs Blood pressure 101/67, pulse 83, temperature 98.3 F (36.8 C), temperature source Oral, resp. rate 16, weight 63.8 kg, SpO2 98 %. Gen: no distress, normal appearing, cachectic HEENT: oral mucosa pink and moist, NCAT Cardio: Reg rate Chest: normal effort, normal rate of breathing Abd: soft, non-distended Ext: no edema Psych: pleasant, normal affect Skin: in his sacram/perinal there is some sort of blue, rubber retention-like material with associated slight serosanguinous drainage Neuro: alert, oriented. Follows basic commands. Feeding self breakfast. Dysarthric but language intact. Left gaze preference. Tending to all items on tray.  LUE and LLE 5/5. RUE 1-2/5, RLE trace hip/knee extensor synergy  0/5  Distal .Musculoskeletal: Full ROM, No pain with AROM or PROM in the neck, trunk, or extremities. Posture appropriate     Assessment/Plan: 1. Functional deficits which require 3+ hours per day of interdisciplinary therapy in a comprehensive inpatient rehab setting.  Physiatrist is providing close team supervision and 24 hour management of active medical problems  listed below.  Physiatrist and rehab team continue to assess barriers to discharge/monitor patient progress toward functional and medical goals  Care Tool:  Bathing    Body parts bathed by patient: Right arm,Chest,Abdomen,Right upper leg,Left upper leg,Face   Body parts bathed by helper: Left arm     Bathing assist Assist Level: Moderate Assistance - Patient 50 - 74%     Upper Body Dressing/Undressing Upper body dressing   What is the patient wearing?: Pull over shirt    Upper body assist Assist Level: Moderate Assistance - Patient 50 - 74%    Lower Body Dressing/Undressing Lower body dressing      What is the patient wearing?: Pants     Lower body assist Assist for lower body dressing: Maximal Assistance - Patient 25 - 49%     Toileting Toileting    Toileting assist Assist for toileting: Moderate Assistance - Patient 50 - 74% Assistive Device Comment: ueinal   Transfers Chair/bed transfer  Transfers assist     Chair/bed transfer assist level: Moderate Assistance - Patient 50 - 74%     Locomotion Ambulation   Ambulation assist   Ambulation activity did not occur: Safety/medical concerns  Assist level: 2 helpers Assistive device: Other (comment) (L handrail) Max distance: 45ft   Walk 10 feet activity   Assist  Walk 10 feet activity did not occur: Safety/medical concerns  Assist  level: 2 helpers Assistive device: Other (comment) (L handrail)   Walk 50 feet activity   Assist Walk 50 feet with 2 turns activity did not occur: Safety/medical concerns         Walk 150 feet activity   Assist Walk 150 feet activity did not occur: Safety/medical concerns         Walk 10 feet on uneven surface  activity   Assist Walk 10 feet on uneven surfaces activity did not occur: Safety/medical concerns         Wheelchair     Assist Will patient use wheelchair at discharge?: Yes Type of Wheelchair: Manual    Wheelchair assist level:  Supervision/Verbal cueing Max wheelchair distance: 150    Wheelchair 50 feet with 2 turns activity    Assist        Assist Level: Supervision/Verbal cueing   Wheelchair 150 feet activity     Assist      Assist Level: Supervision/Verbal cueing   Blood pressure 101/67, pulse 83, temperature 98.3 F (36.8 C), temperature source Oral, resp. rate 16, weight 63.8 kg, SpO2 98 %.  Medical Problem List and Plan: 1.  Dysarthria/right hemiparesis with left gaze preference secondary to multifocal acute ischemia within the bilateral, left greater than right multiple vascular territories due to cardioembolic source             -patient may shower           -Continue CIR.  D/C date 6/15 2.  Atrial fibrillation: continue pradaxa             -antiplatelet therapy: Aspirin 81 mg daily 3. Rectal pain 2/2 adenocarcinomat: Continue Hydrocodone as needed. D/c tylenol given allergy (palpitations). Add Gabapentin 300mg  HS.  4. Mood: Provide emotional support             -antipsychotic agents: N/A 5. Neuropsych: This patient is capable of making decisions on his own behalf. 6. Skin/Wound Care:  Pt with hx of rectal cancer with radiation. Has had chronic wound. Has exposed retention material with slight s/s drainage.   -appreciate WOC RN follow up  -continue local care, keep area clean. Triple paste per WOC RN. Continue foam dressings and change PRN soilage. 7. Hyponatremia: monitor CMP weekly.  8.  Hypertension.  Continue Toprol-XL 50 mg daily.  BP soft- continue to monitor.  Vitals:   02/28/21 2028 03/01/21 0546  BP: 103/66 101/67  Pulse: 81 83  Resp: 17 16  Temp: (!) 97.5 F (36.4 C) 98.3 F (36.8 C)  SpO2: 92% 98%  controlled 6/3 9.  Hyperlipidemia: Zocor 10.  History of squamous cell carcinoma of the lung Follow-up outpatient 11.  Cardiomyopathy with history of bilateral pulmonary emboli.  Continue Pradaxa 12.  Atrial fibrillation.  Cardiac rate in 60s- 80s             Continue  to monitor heart rate with increased physical activity 13.  History of tobacco abuse.  Counseling 14.  GERD: Protonix drop with meds as below 15.  Thrombocytopenia   Resolved  16.  ELevated Hgb- may have some hemoconcentration with elevated BUN/Creat enc po fluid, baseline 15-16 17.  Hx rectal adenoCA with colostomy from 2017- will have issues managing this due to hemiplegia (see above also) 18. AKI: encourage hydration. Repeat creatinine on Monday 19.  Poor appetite, pt states it is due to food sticking in his throat, cont to work with SLP   LOS: 8 days A FACE TO FACE EVALUATION WAS  PERFORMED  Izora Ribas 03/01/2021, 4:17 PM

## 2021-03-01 NOTE — Progress Notes (Signed)
Speech Language Pathology Daily Session Note  Patient Details  Name: Juan Wheeler MRN: [id number] Date of Birth: [birth year]  Today's Date: 03/01/2021 SLP Individual Time: 0141-0301 SLP Individual Time Calculation (min): 26 min  Short Term Goals: Week 1: SLP Short Term Goal 1 (Week 1): Pt will tolerate Dys 2/thin diet with min s/s dysphagia/aspiration with mod A cues for use of strategies SLP Short Term Goal 2 (Week 1): Pt will trial Dys 3 textures with min s/s dysphagia/aspiration with mod A cues for use of strategies SLP Short Term Goal 3 (Week 1): Pt will understand, recall and utilize compensatory strategies to increase speech intelligibility to 75% with mod A verbal cues SLP Short Term Goal 4 (Week 1): Pt will increase sustained attention to functional tasks to >5 minutes with min A verbal cues SLP Short Term Goal 5 (Week 1): Pt will recall novel and functional information with 70% accuracy provided mod A cues SLP Short Term Goal 6 (Week 1): Pt will complete midly complex problem solving tasks with 50% accuracy provided mod A cues  Skilled Therapeutic Interventions: Pt seen for skilled ST with focus on dysphagia goals. Pt agreeable to ongoing Dys 3 trials despite reportedly poor appetite. Pt consuming various Dys 3 items with independent use of liquid assistance as needed. Pt with minimal pocketing in L cheek, minimal lingual stasis as well, all effectively cleared with liquid wash. 1 instance of pt reporting food feels "stuck", hard throat clear and thin liquid reducing globus sensation. No overt s/s aspiration with any trials, diet upgraded to Dys 3 with ongoing standard swallow precautions as trained. Wife and pt agreeable. Pt left in bed with alarm set and wife present. Cont ST POC.   Pain Pain Assessment Pain Scale: 0-10 Pain Score: 7  Pain Location: Buttocks  Therapy/Group: Individual Therapy  Dewaine Conger 03/01/2021, 11:04 AM

## 2021-03-02 NOTE — Progress Notes (Signed)
PROGRESS NOTE   Subjective/Complaints: Somnolent this morning Patient's chart reviewed- No issues reported overnight Vitals signs stable  ROS: Patient denies fever, rash, sore throat, blurred vision, nausea, vomiting, diarrhea, cough, shortness of breath or chest pain, headache, or mood change, +insomnia, +rectal pain   Objective:   No results found. No results for input(s): WBC, HGB, HCT, PLT in the last 72 hours. No results for input(s): NA, K, CL, CO2, GLUCOSE, BUN, CREATININE, CALCIUM in the last 72 hours.  Intake/Output Summary (Last 24 hours) at 03/02/2021 1447 Last data filed at 03/02/2021 1405 Gross per 24 hour  Intake 709 ml  Output 370 ml  Net 339 ml        Physical Exam: Vital Signs Blood pressure 102/62, pulse 75, temperature 97.8 F (36.6 C), temperature source Oral, resp. rate 16, weight 63.8 kg, SpO2 97 %. Gen: no distress, normal appearing HEENT: oral mucosa pink and moist, NCAT Cardio: Reg rate Chest: normal effort, normal rate of breathing Abd: soft, non-distended Ext: no edema Psych: pleasant, normal affect Skin: in his sacram/perinal there is some sort of blue, rubber retention-like material with associated slight serosanguinous drainage Neuro: alert, oriented. Follows basic commands. Feeding self breakfast. Dysarthric but language intact. Left gaze preference. Tending to all items on tray.  LUE and LLE 5/5. RUE 1-2/5, RLE trace hip/knee extensor synergy  0/5  Distal .Musculoskeletal: Full ROM, No pain with AROM or PROM in the neck, trunk, or extremities. Posture appropriate     Assessment/Plan: 1. Functional deficits which require 3+ hours per day of interdisciplinary therapy in a comprehensive inpatient rehab setting.  Physiatrist is providing close team supervision and 24 hour management of active medical problems listed below.  Physiatrist and rehab team continue to assess barriers to  discharge/monitor patient progress toward functional and medical goals  Care Tool:  Bathing    Body parts bathed by patient: Right arm,Chest,Abdomen,Right upper leg,Left upper leg,Face   Body parts bathed by helper: Left arm     Bathing assist Assist Level: Moderate Assistance - Patient 50 - 74%     Upper Body Dressing/Undressing Upper body dressing   What is the patient wearing?: Pull over shirt    Upper body assist Assist Level: Moderate Assistance - Patient 50 - 74%    Lower Body Dressing/Undressing Lower body dressing      What is the patient wearing?: Pants     Lower body assist Assist for lower body dressing: Maximal Assistance - Patient 25 - 49%     Toileting Toileting    Toileting assist Assist for toileting: Moderate Assistance - Patient 50 - 74% Assistive Device Comment: ueinal   Transfers Chair/bed transfer  Transfers assist     Chair/bed transfer assist level: Moderate Assistance - Patient 50 - 74%     Locomotion Ambulation   Ambulation assist   Ambulation activity did not occur: Safety/medical concerns  Assist level: 2 helpers Assistive device: Other (comment) (L handrail) Max distance: 38ft   Walk 10 feet activity   Assist  Walk 10 feet activity did not occur: Safety/medical concerns  Assist level: 2 helpers Assistive device: Other (comment) (L handrail)   Walk 50 feet activity  Assist Walk 50 feet with 2 turns activity did not occur: Safety/medical concerns         Walk 150 feet activity   Assist Walk 150 feet activity did not occur: Safety/medical concerns         Walk 10 feet on uneven surface  activity   Assist Walk 10 feet on uneven surfaces activity did not occur: Safety/medical concerns         Wheelchair     Assist Will patient use wheelchair at discharge?: Yes Type of Wheelchair: Manual    Wheelchair assist level: Supervision/Verbal cueing Max wheelchair distance: 150    Wheelchair 50  feet with 2 turns activity    Assist        Assist Level: Supervision/Verbal cueing   Wheelchair 150 feet activity     Assist      Assist Level: Supervision/Verbal cueing   Blood pressure 102/62, pulse 75, temperature 97.8 F (36.6 C), temperature source Oral, resp. rate 16, weight 63.8 kg, SpO2 97 %.  Medical Problem List and Plan: 1.  Dysarthria/right hemiparesis with left gaze preference secondary to multifocal acute ischemia within the bilateral, left greater than right multiple vascular territories due to cardioembolic source             -patient may shower           -Continue CIR.  D/C date 6/15 2.  Atrial fibrillation: continue pradaxa             -antiplatelet therapy: Aspirin 81 mg daily 3. Rectal pain 2/2 adenocarcinoma: Continue Hydrocodone as needed. D/c tylenol given allergy (palpitations). Add Gabapentin 300mg  HS.  4. Mood: Provide emotional support             -antipsychotic agents: N/A 5. Neuropsych: This patient is capable of making decisions on his own behalf. 6. Skin/Wound Care:  Pt with hx of rectal cancer with radiation. Has had chronic wound. Has exposed retention material with slight s/s drainage.   -appreciate WOC RN follow up  -continue local care, keep area clean. Triple paste per WOC RN. Continue foam dressings and change PRN soilage. 7. Hyponatremia: monitor CMP weekly.  8.  Hypertension.  Continue Toprol-XL 50 mg daily.  BP soft- continue to monitor.  Vitals:   03/02/21 0354 03/02/21 1405  BP: 107/75 102/62  Pulse: 80 75  Resp:  16  Temp: (!) 97.5 F (36.4 C) 97.8 F (36.6 C)  SpO2: 93% 97%  controlled 6/3 9.  Hyperlipidemia: Zocor 10.  History of squamous cell carcinoma of the lung Follow-up outpatient 11.  Cardiomyopathy with history of bilateral pulmonary emboli.  Continue Pradaxa 12.  Atrial fibrillation.  Cardiac rate in 60s- 80s             Continue to monitor heart rate with increased physical activity 13.  History of tobacco  abuse.  Counseling 14.  GERD: Protonix drop with meds as below 15.  Thrombocytopenia   Resolved  16.  ELevated Hgb- may have some hemoconcentration with elevated BUN/Creat enc po fluid, baseline 15-16 17.  Hx rectal adenoCA with colostomy from 2017- will have issues managing this due to hemiplegia (see above also) 18. AKI: encourage hydration. Repeat creatinine on Monday 19.  Poor appetite, pt states it is due to food sticking in his throat, cont to work with SLP. Consider mirtazepine at night if persists to help with appetite and sleep. Discussed with wife bringing in meals he prefers from home and reviewing with SLP D3 options  he would like  LOS: 9 days A FACE TO FACE EVALUATION WAS PERFORMED  Clide Deutscher Alaia Lordi 03/02/2021, 2:47 PM

## 2021-03-03 NOTE — Progress Notes (Signed)
Physical Therapy Session Note  Patient Details  Name: Juan Wheeler MRN: [id number] Date of Birth: Jan 06, 1961  Today's Date: 03/03/2021 PT Individual Time: 4720-7218 PT Individual Time Calculation (min): 55 min   Short Term Goals: Week 1:  PT Short Term Goal 1 (Week 1): Pt will perform bed mobility with mod assist PT Short Term Goal 1 - Progress (Week 1): Met PT Short Term Goal 2 (Week 1): Pt will transfer to [facility] with mod assist PT Short Term Goal 2 - Progress (Week 1): Met PT Short Term Goal 3 (Week 1): Pt will ambulate with mod assist +2 for safety and LRAD x 90f PT Short Term Goal 3 - Progress (Week 1): Met PT Short Term Goal 4 (Week 1): Pt will propell WC with supervisoin assist >1060fPT Short Term Goal 4 - Progress (Week 1): Met Week 2:  PT Short Term Goal 1 (Week 2): Pt will ambulate 1545fith max A of 1 PT Short Term Goal 2 (Week 2): Pt will initiate stair navigation PT Short Term Goal 3 (Week 2): Pt initate understanding of managing WC parts  Skilled Therapeutic Interventions/Progress Updates:   Received pt supine in bed with wife present at bedside, pt agreeable to therapy, and denied any pain during session but reported not sleeping well last night due to sacral wound. Session with emphasis on dressing, functional mobility/transfers, generalized strengthening, dynamic standing balance/coordination, NMR, and improved activity tolerance. Doffed dirty brief and donned clean one with max A via rolling L/R with supervision and use of bedrails. Donned pants in supine with max A for RLE management and min A for LLE management and pt able to bridge and pull pants over hips with max A to achieve RLE hooklying position. Cleaned ostomy bag and pt transferred supine<>sitting EOB with min A  for RLE management and doffed gown and donned clean pull over shirt with mod A. Bed<>WC stand<>pivot to L with min A. Donned shoes with total A. Provided pt with RW and hand orthosis and pt transferred  sit<>stand with RW and mod A. Pt with R knee hyperextension and lacking hip flexor activation to advance RLE. Practice blocked practice sit<>stands with RW and mod A fading to min A to fatigue (x5 reps). Cues for scooting to edge of WC, hand placement, and upright posture and knee control in stance. Pt then performed seated active assisted R knee extension towel slides 2x10 with emphasis on quad strengthening. Of note, pt with increased hamstring activation but still required max A for knee flexion. Concluded session with pt sitting in WC, needs within reach, and seatbelt alarm on. Wife present at bedside and staff notified that pt is only to use RW with PT.   Therapy Documentation Precautions:  Precautions Precautions: Fall Precaution Comments: dense R hemi, dysarthria, colostomy Restrictions Weight Bearing Restrictions: No  Therapy/Group: Individual Therapy AnnAlfonse Alpers, DPT   03/03/2021, 7:22 AM

## 2021-03-03 NOTE — Progress Notes (Signed)
Occupational Therapy Session Note  Patient Details  Name: Juan Wheeler MRN: [id number] Date of Birth: [birth year]  Today's Date: 03/03/2021 OT Individual Time: 5537-4827 OT Individual Time Calculation (min): 57 min    Short Term Goals: Week 2:  OT Short Term Goal 1 (Week 2): Pt will complete LB dressing min assist at sit > stand level OT Short Term Goal 2 (Week 2): Pt will complete UB dressing with min assist OT Short Term Goal 3 (Week 2): Pt will complete stand pivot transfers with min assist 3 of 4 attempts  Skilled Therapeutic Interventions/Progress Updates:    Treatment session with focus on functional transfers, trunk control, and RUE NMR.  Pt received supine in bed agreeable to therapy session.  Completed squat pivot transfer to R with focus on anterior weight shift and maintaining low posture as pt frequently attempting to stand and then requiring increased assistance to advance RLE during transfer.  Engaged in lateral scoots R and L on therapy mat with focus on anterior weight shift and clearance of buttocks.  Pt required tactile cues at R knee and min cues for weight shifting.  Engaged in [place] in sitting with use of mirror for visual feedback and UE Ranger.  Therapist providing support at elbow to decrease subluxation and minimize effects of gravity while pt completed shoulder flexion/extension and horizontal abduction/adduction.  Engaged in sit > stand with back of chair in front to allow for UE support.  Therapist providing support at R knee during transitional movements even challenging pt to complete stand > partial sit > stand to challenge motor control and RLE stability.  Pt returned to w/c squat pivot mod assist to R and min assist to L back to bed.  Pt returned to supine, educating on lying in sidelying or in partial sidelying to relieve pressure on buttocks.    Therapy Documentation Precautions:  Precautions Precautions: Fall Precaution Comments: dense R hemi, dysarthria,  colostomy Restrictions Weight Bearing Restrictions: No General:   Vital Signs: Therapy Vitals Temp: 97.9 F (36.6 C) Temp Source: Oral Pulse Rate: 94 Resp: 17 BP: 103/68 Patient Position (if appropriate): Lying Oxygen Therapy SpO2: 92 % O2 Device: Room Air Pain: Pain Assessment Pain Scale: Faces Faces Pain Scale: No hurt   Therapy/Group: Individual Therapy  Simonne Come 03/03/2021, 3:10 PM

## 2021-03-03 NOTE — Progress Notes (Signed)
Occupational Therapy Weekly Progress Note  Patient Details  Name: Juan Wheeler MRN: [id number] Date of Birth: 04/19/1961  Beginning of progress report period: Feb 22, 2021 End of progress report period: March 03, 2021   Patient has met 3 of 3 short term goals.  Pt is making steady progress towards goals.  Pt currently requires mod assist for squat and stand pivot transfers, especially when transferring towards R side.  Pt is demonstrating improved sit > stand, able to complete with as little as min assist for sit > stand and static standing balance. Pt requires up to mod assist for dynamic standing balance with blocking at R knee due to instability in standing. Pt still demonstrates decreased awareness and sensation in RUE, especially in regards to positioning UE during mobility.  Pt is demonstrating minimal mobility in RUE distal > proximal.  Have begun NMES to shoulder to facilitate motor recovery and decrease subluxation and pain.  Patient continues to demonstrate the following deficits: muscle weakness, decreased cardiorespiratoy endurance, impaired timing and sequencing, abnormal tone, unbalanced muscle activation, motor apraxia, decreased coordination and decreased motor planning, decreased midline orientation and decreased attention to right, decreased awareness, decreased problem solving, decreased safety awareness and delayed processing and decreased sitting balance, decreased standing balance, decreased postural control and hemiplegia and therefore will continue to benefit from skilled OT intervention to enhance overall performance with BADL and Reduce care partner burden.  Patient progressing toward long term goals..  Continue plan of care.  OT Short Term Goals Week 1:  OT Short Term Goal 1 (Week 1): Pt will don shirt with mod A via hemi-technique. OT Short Term Goal 1 - Progress (Week 1): Met OT Short Term Goal 2 (Week 1): Pt will maintain static sitting balance unsupported with close  S for >5 min. OT Short Term Goal 2 - Progress (Week 1): Met OT Short Term Goal 3 (Week 1): Pt will complete STS with mod A in prep for standing ADL. OT Short Term Goal 3 - Progress (Week 1): Met Week 2:  OT Short Term Goal 1 (Week 2): Pt will complete LB dressing min assist at sit > stand level OT Short Term Goal 2 (Week 2): Pt will complete UB dressing with min assist OT Short Term Goal 3 (Week 2): Pt will complete stand pivot transfers with min assist 3 of 4 attempts     Therapy/Group: Individual Therapy  Simonne Come 03/03/2021, 8:51 AM

## 2021-03-03 NOTE — Progress Notes (Signed)
PROGRESS NOTE   Subjective/Complaints: Still c/o rectal/sacral pain. Is able to adjust himself in bed. Otherwise he reports that he's doing ok  ROS: Patient denies fever, rash, sore throat, blurred vision, nausea, vomiting, diarrhea, cough, shortness of breath or chest pain,  headache, or mood change.    Objective:   No results found. No results for input(s): WBC, HGB, HCT, PLT in the last 72 hours. No results for input(s): NA, K, CL, CO2, GLUCOSE, BUN, CREATININE, CALCIUM in the last 72 hours.  Intake/Output Summary (Last 24 hours) at 03/03/2021 1238 Last data filed at 03/03/2021 0816 Gross per 24 hour  Intake 772 ml  Output 800 ml  Net -28 ml        Physical Exam: Vital Signs Blood pressure 114/65, pulse 99, temperature 97.7 F (36.5 C), temperature source Oral, resp. rate 17, weight 63.8 kg, SpO2 91 %. Constitutional: No distress . Vital signs reviewed. HEENT: EOMI, oral membranes moist Neck: supple Cardiovascular: RRR without murmur. No JVD    Respiratory/Chest: CTA Bilaterally without wheezes or rales. Normal effort    GI/Abdomen: BS +, non-tender, non-distended Ext: no clubbing, cyanosis, or edema Psych: pleasant and cooperative Skin: in his sacram/perinal there a blue, rubber retention-like material with associated slight serosanguinous drainage on gauze/foam dressing Neuro: alert, oriented. Follows basic commands. Feeding self breakfast. Dysarthric but language intact. Left gaze preference. Tending to all items on tray.  LUE and LLE 5/5. RUE 1-2/5, RLE trace hip/knee extensor synergy  0/5  Distal .Musculoskeletal: Full ROM, No pain with AROM or PROM in the neck, trunk, or extremities. Posture appropriate      Assessment/Plan: 1. Functional deficits which require 3+ hours per day of interdisciplinary therapy in a comprehensive inpatient rehab setting.  Physiatrist is providing close team supervision and 24 hour  management of active medical problems listed below.  Physiatrist and rehab team continue to assess barriers to discharge/monitor patient progress toward functional and medical goals  Care Tool:  Bathing    Body parts bathed by patient: Right arm,Chest,Abdomen,Right upper leg,Left upper leg,Face   Body parts bathed by helper: Left arm     Bathing assist Assist Level: Moderate Assistance - Patient 50 - 74%     Upper Body Dressing/Undressing Upper body dressing   What is the patient wearing?: Pull over shirt    Upper body assist Assist Level: Moderate Assistance - Patient 50 - 74%    Lower Body Dressing/Undressing Lower body dressing      What is the patient wearing?: Pants     Lower body assist Assist for lower body dressing: Maximal Assistance - Patient 25 - 49%     Toileting Toileting    Toileting assist Assist for toileting: Moderate Assistance - Patient 50 - 74% Assistive Device Comment: ueinal   Transfers Chair/bed transfer  Transfers assist     Chair/bed transfer assist level: Minimal Assistance - Patient > 75%     Locomotion Ambulation   Ambulation assist   Ambulation activity did not occur: Safety/medical concerns  Assist level: 2 helpers Assistive device: Other (comment) (L handrail) Max distance: 75ft   Walk 10 feet activity   Assist  Walk 10 feet activity  did not occur: Safety/medical concerns  Assist level: 2 helpers Assistive device: Other (comment) (L handrail)   Walk 50 feet activity   Assist Walk 50 feet with 2 turns activity did not occur: Safety/medical concerns         Walk 150 feet activity   Assist Walk 150 feet activity did not occur: Safety/medical concerns         Walk 10 feet on uneven surface  activity   Assist Walk 10 feet on uneven surfaces activity did not occur: Safety/medical concerns         Wheelchair     Assist Will patient use wheelchair at discharge?: Yes Type of Wheelchair:  Manual    Wheelchair assist level: Supervision/Verbal cueing Max wheelchair distance: 150    Wheelchair 50 feet with 2 turns activity    Assist        Assist Level: Supervision/Verbal cueing   Wheelchair 150 feet activity     Assist      Assist Level: Supervision/Verbal cueing   Blood pressure 114/65, pulse 99, temperature 97.7 F (36.5 C), temperature source Oral, resp. rate 17, weight 63.8 kg, SpO2 91 %.  Medical Problem List and Plan: 1.  Dysarthria/right hemiparesis with left gaze preference secondary to multifocal acute ischemia within the bilateral, left greater than right multiple vascular territories due to cardioembolic source             -patient may shower           -Continue CIR.  D/C date 6/15 2.  Atrial fibrillation: continue pradaxa             -antiplatelet therapy: Aspirin 81 mg daily 3. Rectal pain 2/2 adenocarcinoma: Continue Hydrocodone as needed. D/c tylenol given allergy (palpitations). Added Gabapentin 300mg  HS.   -needs to keep turning in bed as well 4. Mood: Provide emotional support             -antipsychotic agents: N/A 5. Neuropsych: This patient is capable of making decisions on his own behalf. 6. Skin/Wound Care:  Pt with hx of rectal cancer with radiation. Has had chronic wound. Has exposed retention material with slight s/s drainage.   -appreciate WOC RN follow up  -continue local care, keep area clean. Triple paste per WOC RN. Continue foam dressings and change PRN for soilage. 7. Hyponatremia: monitor CMP weekly.  8.  Hypertension.  Continue Toprol-XL 50 mg daily.  BP soft- continue to monitor.  Vitals:   03/02/21 1943 03/03/21 0529  BP: 104/74 114/65  Pulse: 75 99  Resp: 16 17  Temp: 97.7 F (36.5 C) 97.7 F (36.5 C)  SpO2: 92% 91%  controlled 6/6 9.  Hyperlipidemia: Zocor 10.  History of squamous cell carcinoma of the lung Follow-up outpatient 11.  Cardiomyopathy with history of bilateral pulmonary emboli.  Continue  Pradaxa 12.  Atrial fibrillation.                HR controlled 13.  History of tobacco abuse.  Counseling 14.  GERD: Protonix drop with meds as below 15.  Thrombocytopenia   Resolved  16.  ELevated Hgb- may have some hemoconcentration with elevated BUN/Creat enc po fluid, baseline 15-16 17.  Hx rectal adenoCA with colostomy from 2017- will have issues managing this due to hemiplegia (see above also) 18. AKI: encourage hydration. Repeat creatinine on Monday---no labs today 6/6  -will order for Tuesday 19.  Poor appetite, pt states it is due to food sticking in his throat, cont to  work with SLP. Consider mirtazepine at night if persists to help with appetite and sleep. Discussed with wife bringing in meals he prefers from home and reviewing with SLP D3 options he would like  -6/6 has eaten nearly 100% of meals for last 30 hours.   LOS: 10 days A FACE TO FACE EVALUATION WAS PERFORMED  Meredith Staggers 03/03/2021, 12:38 PM

## 2021-03-03 NOTE — Progress Notes (Signed)
Speech Language Pathology Weekly Progress and Session Note  Patient Details  Name: Juan Wheeler MRN: [id number] Date of Birth: October 29, 1960  Beginning of progress report period: Feb 24, 2021 End of progress report period: March 03, 2021  Today's Date: 03/03/2021 SLP Individual Time: 1220-1250 SLP Individual Time Calculation (min): 30 min  Short Term Goals: Week 1: SLP Short Term Goal 1 (Week 1): Pt will tolerate Dys 2/thin diet with min s/s dysphagia/aspiration with mod A cues for use of strategies SLP Short Term Goal 1 - Progress (Week 1): Met SLP Short Term Goal 2 (Week 1): Pt will trial Dys 3 textures with min s/s dysphagia/aspiration with mod A cues for use of strategies SLP Short Term Goal 2 - Progress (Week 1): Met SLP Short Term Goal 3 (Week 1): Pt will understand, recall and utilize compensatory strategies to increase speech intelligibility to 75% with mod A verbal cues SLP Short Term Goal 3 - Progress (Week 1): Met SLP Short Term Goal 4 (Week 1): Pt will increase sustained attention to functional tasks to >5 minutes with min A verbal cues SLP Short Term Goal 4 - Progress (Week 1): Met SLP Short Term Goal 5 (Week 1): Pt will recall novel and functional information with 70% accuracy provided mod A cues SLP Short Term Goal 5 - Progress (Week 1): Met SLP Short Term Goal 6 (Week 1): Pt will complete midly complex problem solving tasks with 50% accuracy provided mod A cues SLP Short Term Goal 6 - Progress (Week 1): Met    New Short Term Goals: Week 2: SLP Short Term Goal 1 (Week 2): Pt will tolerate current diet and thin liquids with no overt s/sx of aspiration or penetration and use of strategies. SLP Short Term Goal 2 (Week 2): Pt will demonstrate use of  compensatory strategies to increase speech intelligibility to 85% with min A verbal cues SLP Short Term Goal 3 (Week 2): Pt will increase sustained attention to functional tasks for 10 minutes with min A verbal cues SLP Short Term  Goal 4 (Week 2): Pt will recall novel and functional information with 85% accuracy provided min A cues SLP Short Term Goal 5 (Week 2): Pt will complete midly complex problem solving tasks with 85% accuracy provided mod A cues  Weekly Progress Updates: Pt has met 6/6 Stg's this reporting period due to improved tolerance with Dys 3 diet, improvement in sustained attention and improvement in problem solving. He demonstrates improved speech intelligibility at sentence and conversational level but continues to require min A verbal cues for use of overarticulation and segmenting words.  He continues to require mod A for mildly complex problem solving tasks but overall demonstrates improved accuracy once familiar with task. Pt education is ongoing. He will benefit from continued skilled SLP intervention to improve cognition and speech intelligibility.      Intensity: Minumum of 1-2 x/day, 30 to 90 minutes Frequency: 3 to 5 out of 7 days Duration/Length of Stay: 14-17 days Treatment/Interventions: Cognitive remediation/compensation;Speech/Language facilitation;Therapeutic Activities;Functional tasks;Cueing hierarchy;Therapeutic Exercise;Internal/external aids;Dysphagia/aspiration precaution training;Medication managment;Patient/family education   Daily Session  Skilled Therapeutic Interventions: Skilled SLP intervention focused on dysphagia. Pt seen with dys 3 lunch tray and thin liquids. He consumed 75% of meal. Mastication and oral clearance were adequate. He tolerated well with no overt s/sx of aspiration or penetration. Pt demonstrated used of strategies for swallowing with current diet with supervision A. Recommend pt cont with dys 3 diet and thin liquids. Cont with therapy per plan of  care.      General    Pain Pain Assessment Pain Scale: Faces Faces Pain Scale: No hurt  Therapy/Group: Individual Therapy  [name] Willam Munford 03/03/2021, 12:43 PM

## 2021-03-03 NOTE — Progress Notes (Signed)
Speech Language Pathology Daily Session Note  Patient Details  Name: Juan Wheeler MRN: [id number] Date of Birth: December 02, 1960  Today's Date: 03/03/2021 SLP Individual Time: 0730-0800 SLP Individual Time Calculation (min): 30 min  Short Term Goals: Week 1: SLP Short Term Goal 1 (Week 1): Pt will tolerate Dys 2/thin diet with min s/s dysphagia/aspiration with mod A cues for use of strategies SLP Short Term Goal 2 (Week 1): Pt will trial Dys 3 textures with min s/s dysphagia/aspiration with mod A cues for use of strategies SLP Short Term Goal 3 (Week 1): Pt will understand, recall and utilize compensatory strategies to increase speech intelligibility to 75% with mod A verbal cues SLP Short Term Goal 4 (Week 1): Pt will increase sustained attention to functional tasks to >5 minutes with min A verbal cues SLP Short Term Goal 5 (Week 1): Pt will recall novel and functional information with 70% accuracy provided mod A cues SLP Short Term Goal 6 (Week 1): Pt will complete midly complex problem solving tasks with 50% accuracy provided mod A cues  Skilled Therapeutic Interventions:Skilled SLP intervention focused on speech intelligibility and cognition. Pt able to complete simple time calculations using rehab schedule with min A verbal cues. He was able to recall diet being upgraded and reported dys 3 food is more appetizing to him.  Pt consumed 100% of food on tray before SLP arrived. Pt 80% intelligible at sentence level with speech intelligibility task. Min A verbal cues for overarticulation. Cont with therapy per plan of care.      Pain Pain Assessment Pain Scale: Faces Faces Pain Scale: No hurt  Therapy/Group: Individual Therapy  [name] Mathew Postiglione 03/03/2021, 7:58 AM

## 2021-03-04 LAB — CBC
HCT: 47.7 % (ref 39.0–52.0)
Hemoglobin: 16 g/dL (ref 13.0–17.0)
MCH: 32.6 pg (ref 26.0–34.0)
MCHC: 33.5 g/dL (ref 30.0–36.0)
MCV: 97.1 fL (ref 80.0–100.0)
Platelets: 175 10*3/uL (ref 150–400)
RBC: 4.91 MIL/uL (ref 4.22–5.81)
RDW: 14.6 % (ref 11.5–15.5)
WBC: 6.6 10*3/uL (ref 4.0–10.5)
nRBC: 0 % (ref 0.0–0.2)

## 2021-03-04 LAB — BASIC METABOLIC PANEL
Anion gap: 8 (ref 5–15)
BUN: 21 mg/dL — ABNORMAL HIGH (ref 6–20)
CO2: 23 mmol/L (ref 22–32)
Calcium: 9.1 mg/dL (ref 8.9–10.3)
Chloride: 103 mmol/L (ref 98–111)
Creatinine, Ser: 1.26 mg/dL — ABNORMAL HIGH (ref 0.61–1.24)
GFR, Estimated: >60 mL/min (ref >60)
Glucose, Bld: 97 mg/dL (ref 70–99)
Potassium: 4.4 mmol/L (ref 3.5–5.1)
Sodium: 134 mmol/L — ABNORMAL LOW (ref 135–145)

## 2021-03-04 MED ORDER — OXYCODONE HCL ER 10 MG PO T12A
10.0000 mg | EXTENDED_RELEASE_TABLET | Freq: Two times a day (BID) | ORAL | Status: DC
  Administered 2021-03-04 – 2021-03-12 (×16): 10 mg via ORAL
  Filled 2021-03-04 (×16): qty 1

## 2021-03-04 NOTE — Progress Notes (Signed)
Occupational Therapy Session Note  Patient Details  Name: Juan Wheeler MRN: [id number] Date of Birth: [birth year]  Today's Date: 03/04/2021 OT Individual Time: 3845-3646 and 1130-1200 OT Individual Time Calculation (min): 33 min and 59min and Today's Date: 03/04/2021 OT Missed Time: 27 Minutes Missed Time Reason: Patient fatigue;Pain   Short Term Goals: Week 2:  OT Short Term Goal 1 (Week 2): Pt will complete LB dressing min assist at sit > stand level OT Short Term Goal 2 (Week 2): Pt will complete UB dressing with min assist OT Short Term Goal 3 (Week 2): Pt will complete stand pivot transfers with min assist 3 of 4 attempts  Skilled Therapeutic Interventions/Progress Updates:    1) Treatment session with focus on self-care retraining, dynamic sitting and standing balance, and hemi-technique for bathing and dressing.  Pt received supine in bed reporting not sleeping well over night due to pain.  Pt completed bed mobility with min assist and cues for attention to RUE and RLE during mobility.  Engaged in bathing and dressing from EOB with focus on sitting balance. Therapist educated on figure 4 position to allow pt to wash feet and completing LB dressing with increased participation.  Pt reports increased pain in sacral wound with sitting upright.  Pt completed UB dressing with min assist to thread R sleeve while therapist educating on hemi-dressing technique.  Pt able to thread BLE with assist to obtain and maintain figure 4 position to thread RLE.  Pt then thread LLE and was able to pull pants over L hip while standing.  Therapist providing min-mod assist and blocking at R knee during standing.  Pt reports "tired as hell" and requested to return to supine to rest until next therapy session.  Pt repositioned and left in semi-sidelying to pressure relief.  2) Treatment session with focus on functional transfers, sit > stand, and stroke education.  Pt's brother present and asking questions about  stroke etiology, symptoms, and warning signs.  Therapist educated on BE FAST and provided pt and brother with handout while educating on each acronym. Pt completed bed mobility with min assist and squat pivot transfer to R min-mod assist with blocking at RLE.  Pt completed oral care in sitting with use of RUE as stabilizer when opening and applying toothpaste.  Pt completed sit > partial stand when spitting and rinsing mouth, requiring min assist.  Pt returned to bed and semi-sidelying for pressure relief.   Therapy Documentation Precautions:  Precautions Precautions: Fall Precaution Comments: dense R hemi, dysarthria, colostomy Restrictions Weight Bearing Restrictions: No General: General OT Amount of Missed Time: 27 Minutes Pain: Pain Assessment Pain Scale: Faces Faces Pain Scale: No hurt   Therapy/Group: Individual Therapy  Simonne Come 03/04/2021, 12:09 PM

## 2021-03-04 NOTE — Progress Notes (Signed)
PROGRESS NOTE   Subjective/Complaints: Sleepy this morning Tolerated SLP well Discussed with Sarah OT sponge bathing given rectal adenocarcinoma.  Still have difficulty sleeping at night and tolerating therapy due to rectal pain  ROS: Patient denies fever, rash, sore throat, blurred vision, nausea, vomiting, diarrhea, cough, shortness of breath or chest pain,  headache, or mood change. +rectal pain   Objective:   No results found. Recent Labs    03/04/21 0513  WBC 6.6  HGB 16.0  HCT 47.7  PLT 175   Recent Labs    03/04/21 0513  NA 134*  K 4.4  CL 103  CO2 23  GLUCOSE 97  BUN 21*  CREATININE 1.26*  CALCIUM 9.1    Intake/Output Summary (Last 24 hours) at 03/04/2021 1022 Last data filed at 03/04/2021 0721 Gross per 24 hour  Intake 614 ml  Output 875 ml  Net -261 ml        Physical Exam: Vital Signs Blood pressure 114/74, pulse 88, temperature 98.1 F (36.7 C), temperature source Oral, resp. rate 18, weight 63.8 kg, SpO2 93 %. Gen: no distress, normal appearing HEENT: oral mucosa pink and moist, NCAT Cardio: Reg rate Chest: normal effort, normal rate of breathing Abd: soft, non-distended Ext: no edema Psych: pleasant and cooperative Skin: in his sacram/perinal there a blue, rubber retention-like material with associated slight serosanguinous drainage on gauze/foam dressing Neuro: alert, oriented. Follows basic commands. Feeding self breakfast. Dysarthric but language intact. Left gaze preference. Tending to all items on tray.  LUE and LLE 5/5. RUE 1-2/5, RLE trace hip/knee extensor synergy  0/5  Distal Musculoskeletal: Full ROM, No pain with AROM or PROM in the neck, trunk, or extremities. Posture appropriate     Assessment/Plan: 1. Functional deficits which require 3+ hours per day of interdisciplinary therapy in a comprehensive inpatient rehab setting.  Physiatrist is providing close team supervision  and 24 hour management of active medical problems listed below.  Physiatrist and rehab team continue to assess barriers to discharge/monitor patient progress toward functional and medical goals  Care Tool:  Bathing    Body parts bathed by patient: Right arm,Chest,Abdomen,Right upper leg,Left upper leg,Face   Body parts bathed by helper: Left arm     Bathing assist Assist Level: Moderate Assistance - Patient 50 - 74%     Upper Body Dressing/Undressing Upper body dressing   What is the patient wearing?: Pull over shirt    Upper body assist Assist Level: Moderate Assistance - Patient 50 - 74%    Lower Body Dressing/Undressing Lower body dressing      What is the patient wearing?: Pants     Lower body assist Assist for lower body dressing: Maximal Assistance - Patient 25 - 49%     Toileting Toileting    Toileting assist Assist for toileting: Moderate Assistance - Patient 50 - 74% Assistive Device Comment: ueinal   Transfers Chair/bed transfer  Transfers assist     Chair/bed transfer assist level: Minimal Assistance - Patient > 75%     Locomotion Ambulation   Ambulation assist   Ambulation activity did not occur: Safety/medical concerns  Assist level: 2 helpers Assistive device: Other (comment) (L handrail)  Max distance: 57ft   Walk 10 feet activity   Assist  Walk 10 feet activity did not occur: Safety/medical concerns  Assist level: 2 helpers Assistive device: Other (comment) (L handrail)   Walk 50 feet activity   Assist Walk 50 feet with 2 turns activity did not occur: Safety/medical concerns         Walk 150 feet activity   Assist Walk 150 feet activity did not occur: Safety/medical concerns         Walk 10 feet on uneven surface  activity   Assist Walk 10 feet on uneven surfaces activity did not occur: Safety/medical concerns         Wheelchair     Assist Will patient use wheelchair at discharge?: Yes Type of  Wheelchair: Manual    Wheelchair assist level: Supervision/Verbal cueing Max wheelchair distance: 150    Wheelchair 50 feet with 2 turns activity    Assist        Assist Level: Supervision/Verbal cueing   Wheelchair 150 feet activity     Assist      Assist Level: Supervision/Verbal cueing   Blood pressure 114/74, pulse 88, temperature 98.1 F (36.7 C), temperature source Oral, resp. rate 18, weight 63.8 kg, SpO2 93 %.  Medical Problem List and Plan: 1.  Dysarthria/right hemiparesis with left gaze preference secondary to multifocal acute ischemia within the bilateral, left greater than right multiple vascular territories due to cardioembolic source             -patient may shower           -Continue CIR.  D/C date 6/15 2.  Atrial fibrillation: continue pradaxa             -antiplatelet therapy: Aspirin 81 mg daily 3. Rectal pain 2/2 adenocarcinoma: Continue Hydrocodone as needed. D/c tylenol given allergy (palpitations). Added Gabapentin 300mg  HS. ER Oxycontin q12H 10mg  started 6/6.   -needs to keep turning in bed as well 4. Mood: Provide emotional support             -antipsychotic agents: N/A 5. Neuropsych: This patient is capable of making decisions on his own behalf. 6. Skin/Wound Care:  Pt with hx of rectal cancer with radiation. Has had chronic wound. Has exposed retention material with slight s/s drainage.   -appreciate WOC RN follow up  -continue local care, keep area clean. Triple paste per WOC RN. Continue foam dressings and change PRN for soilage. 7. Hyponatremia: monitor CMP weekly.  8.  Hypertension.  Continue Toprol-XL 50 mg daily.  BP soft- continue to monitor.  Vitals:   03/03/21 2010 03/04/21 0514  BP: 106/73 114/74  Pulse: 86 88  Resp: 18 18  Temp: 97.9 F (36.6 C) 98.1 F (36.7 C)  SpO2: 95% 93%  controlled 6/6 9.  Hyperlipidemia: Zocor 10.  History of squamous cell carcinoma of the lung Follow-up outpatient 11.  Cardiomyopathy with  history of bilateral pulmonary emboli.  Continue Pradaxa 12.  Atrial fibrillation.                HR controlled 13.  History of tobacco abuse.  Counseling 14.  GERD: Protonix drop with meds as below 15.  Thrombocytopenia   Resolved  16.  ELevated Hgb- may have some hemoconcentration with elevated BUN/Creat enc po fluid, baseline 15-16 17.  Hx rectal adenoCA with colostomy from 2017- will have issues managing this due to hemiplegia (see above also) 18. AKI: encourage hydration. Trending downward 6/6, monitor weekly.  19.  Poor appetite: eating much better after upgraded to D3   LOS: 11 days A FACE TO [place] Elman Dettman 03/04/2021, 10:22 AM

## 2021-03-04 NOTE — Progress Notes (Signed)
Physical Therapy Session Note  Patient Details  Name: Juan Wheeler MRN: [id number] Date of Birth: 01/18/1961  Today's Date: 03/04/2021 PT Individual Time: 1330-1409 PT Individual Time Calculation (min): 39 min   Short Term Goals: Week 1:  PT Short Term Goal 1 (Week 1): Pt will perform bed mobility with mod assist PT Short Term Goal 1 - Progress (Week 1): Met PT Short Term Goal 2 (Week 1): Pt will transfer to [name] with mod assist PT Short Term Goal 2 - Progress (Week 1): Met PT Short Term Goal 3 (Week 1): Pt will ambulate with mod assist +2 for safety and LRAD x 59f PT Short Term Goal 3 - Progress (Week 1): Met PT Short Term Goal 4 (Week 1): Pt will propell WC with supervisoin assist >1082fPT Short Term Goal 4 - Progress (Week 1): Met Week 2:  PT Short Term Goal 1 (Week 2): Pt will ambulate 1564fith max A of 1 PT Short Term Goal 2 (Week 2): Pt will initiate stair navigation PT Short Term Goal 3 (Week 2): Pt initate understanding of managing WC parts  Skilled Therapeutic Interventions/Progress Updates:   Received pt supine in bed, pt agreeable to PT treatment, and reported "moderate" pain in sacrum but motivated to participate in therapy. Session with emphasis on functional mobility/transfers, generalized strengthening, dynamic standing balance/coordination, gait training, and improved activity tolerance. Pt transferred supine<>sitting EOB with min A for trunk control however pt able to lift RLE from bed to move to EOB today! Stand<>pivot bed<>WC with min A and donned shoes with max A for time management purposes. Pt transported to 4W therapy gym and donned R DF ace wrap with total A. Worked on blocked practice sit<>stands with RW x 5 reps with mod A fading to min A with total A to manage RUE on hand orthosis and cues to scoot to edge of chair prior to standing. Attempted to ambulate with RW however returned to sitting due to lack of R knee control, difficulty advancing RLE, LOB, and pt  feeling "weak". Instead transitioned to gait with L handrail [id number] trials with mod A +2 with assist to advance RLE and heavily guarding (with minor blocking) of R knee from buckling/hyperextending. Pt [facility] and requested to return to bed. Stand<>pivot WC<>bed with min A and doffed shoes with total A. Sit<>supine with min A for RLE management. Concluded session with pt supine in bed, needs within reach, and bed alarm on. Provided pt with fresh drink and snack.   Therapy Documentation Precautions:  Precautions Precautions: Fall Precaution Comments: dense R hemi, dysarthria, colostomy Restrictions Weight Bearing Restrictions: No  Therapy/Group: Individual Therapy AnnaAlfonse Alpers DPT   03/04/2021, 7:25 AM

## 2021-03-04 NOTE — Patient Care Conference (Signed)
Inpatient RehabilitationTeam Conference and Plan of Care Update Date: 03/04/2021   Time: 1:14 PM   Patient Name: Evaro Record Number: [id number]  Date of Birth: [birth year] Sex: Male         Room/Bed: 5C08C/5C08C-01 Payor Info: Payor: [employer] MEDICARE / Plan: [employer] MEDICARE CHOICE PPO / Product Type: *No Product type* /    Admit Date/Time:  02/21/2021  2:00 PM  Primary Diagnosis:  Acute cerebral infarction [facility])  Hospital Problems: Principal Problem:   Acute cerebral infarction [facility]) Active Problems:   Ischemic cerebrovascular accident (CVA) of frontal lobe [facility])    Expected Discharge Date: Expected Discharge Date: 03/12/21  Team Members Present: Physician leading conference: Dr. Leeroy Cha Care Coodinator Present: Ovidio Kin, LCSW;Tavio Biegel Creig Hines, RN, BSN, CRRN Nurse Present: Dorthula Nettles, RN PT Present: Becky Sax, PT OT Present: Simonne Come, OT SLP Present: Charolett Bumpers, SLP PPS Coordinator present : Ileana Ladd, PT     Current Status/Progress Goal Weekly Team Focus  Bowel/Bladder   Pt is incont/cont of bladder. Pt has a colostomy. LBM 03/03/21  Pt gain cont of bladder and learn how to manage Colostomy  Assess pt B/B needs qshift   Swallow/Nutrition/ Hydration   upgraded to dys 3 and thin. less reports of globus sensation  mod i  tolerance with D3 and carry over of strategies.   ADL's   Min-mod assist bathing, Mod assist UB dressing, Max assist LB dressing, Min-mod A transfers, RUE weakness and RLE instability  Min A overall, Supervision grooming and UB dressing  ADL retraining, sit > stand, dynamic standing balance, transfers, RUE NMR, activity tolerance/endurance   Mobility   bed mobility min A, transfers min A, gait 43ft with mod A +2, WC mobility 110ft supervision  min/mod A; supervision WC mobility  bed mobility, functional mobility/transfers, generalized strengthening, dynamic sitting/standing balance/coordination, NMR, gait, and  endurance to activity.   Communication   sentence -conversation 75% intelligible  Supervision  speeech intell strategies.   Safety/Cognition/ Behavioral Observations     Supervision for basic cog  problem solving with written information, memory   Pain   Pt complains of pain at an 8 out of 10 on his back  Pt pain goal of < 3 out of 10  Assess for pain qshift and provide prn medications   Skin   Chronic sacral  wound  pt skin remains free of additional breakdown  Skin care each shift     Discharge Planning:  Have not heard back from wife regarding their ability to provide care to pt at discharge. He will need 24/7 care and she does work, will continue to try to reach and discuss care. Pt is very motivated and pushing himself daily in therapies.   Team Discussion: Rectal adenocarcinoma, Hydrocodone effective, have patient drink 6-8 cups of water per day. Continent B/B, 8/10 pain to the lower back. Teach colostomy care, wound care to sacral wound and buttocks. Patient will need 24/7 care at discharge. Patient on target to meet rehab goals: yes, wife saw patient try to stand. Min assist for bed mobility, mod assist to stand +2 at the rail in the hallway. Min/mod assist goals overall. Supervision W/C goals. Working on technique for ADL's but pain is limiting, and doesn't sleep well at night.  *See Care Plan and progress notes for long and short-term goals.   Revisions to Treatment Plan:  Monitoring labs. Teaching Needs: Family education, medication management, pain management, colostomy education, skin/wound care,  transfer training, gait training, balance training, endurance training, safety awareness.  Current Barriers to Discharge: Decreased caregiver support, Medical stability, Home enviroment access/layout, Wound care, Lack of/limited family support, Medication compliance and Behavior  Possible Resolutions to Barriers: Continue current medications, provide emotional support.      Medical Summary Current Status: rectal adenocarcinoma, rectal pain, insomnia, poor appetite, elevated creatinine  Barriers to Discharge: Medical stability;Wound care  Barriers to Discharge Comments: rectal adenocarcinoma, rectal pain, insomnia, poor appetite, elevated creatinine Possible Resolutions to [employer]: continue daily would care and education of family regarding wound care, appetite has much improved with counseling, monitor creatinine   Continued Need for Acute Rehabilitation Level of Care: The patient requires daily medical management by a physician with specialized training in physical medicine and rehabilitation for the following reasons: Direction of a multidisciplinary physical rehabilitation program to maximize functional independence : Yes Medical management of patient stability for increased activity during participation in an intensive rehabilitation regime.: Yes Analysis of laboratory values and/or radiology reports with any subsequent need for medication adjustment and/or medical intervention. : Yes   I attest that I was present, lead the team conference, and concur with the assessment and plan of the team.   Cristi Loron 03/04/2021, 6:48 PM

## 2021-03-04 NOTE — Progress Notes (Signed)
Speech Language Pathology Daily Session Note  Patient Details  Name: Juan Wheeler MRN: [id number] Date of Birth: 01/19/1961  Today's Date: 03/04/2021 SLP Individual Time: 0800-0830 SLP Individual Time Calculation (min): 30 min  Short Term Goals: Week 2: SLP Short Term Goal 1 (Week 2): Pt will tolerate current diet and thin liquids with no overt s/sx of aspiration or penetration and use of strategies. SLP Short Term Goal 2 (Week 2): Pt will demonstrate use of  compensatory strategies to increase speech intelligibility to 85% with min A verbal cues SLP Short Term Goal 3 (Week 2): Pt will increase sustained attention to functional tasks for 10 minutes with min A verbal cues SLP Short Term Goal 4 (Week 2): Pt will recall novel and functional information with 85% accuracy provided min A cues SLP Short Term Goal 5 (Week 2): Pt will complete midly complex problem solving tasks with 85% accuracy provided mod A cues  Skilled Therapeutic Interventions:   Skilled SLP intervention focused on cognition. Pt recalled specific details from 5-6 sentences of verbal information with min A verbal cues. He increased accuracy with repetition of information and choice of 2 to increase retrieval. Mildly complex problem solving task required comprehension of written information completed with min A verbal cues. Pt required verbal cue  for upright positioning when taking medications this session. Cont with therapy per plan of care.   Pain Pain Assessment Pain Scale: Faces Pain Score: Asleep Faces Pain Scale: No hurt  Therapy/Group: Individual Therapy  [name] Rickell Wiehe 03/04/2021, 8:29 AM

## 2021-03-05 NOTE — Progress Notes (Signed)
Physical Therapy Session Note  Patient Details  Name: Juan Wheeler MRN: [id number] Date of Birth: 04/23/1961  Today's Date: 03/05/2021 PT Individual Time: 5638-9373 and 4287-6811 PT Individual Time Calculation (min): 26 min and 53 min  Short Term Goals: Week 1:  PT Short Term Goal 1 (Week 1): Pt will perform bed mobility with mod assist PT Short Term Goal 1 - Progress (Week 1): Met PT Short Term Goal 2 (Week 1): Pt will transfer to [facility] with mod assist PT Short Term Goal 2 - Progress (Week 1): Met PT Short Term Goal 3 (Week 1): Pt will ambulate with mod assist +2 for safety and LRAD x 9f PT Short Term Goal 3 - Progress (Week 1): Met PT Short Term Goal 4 (Week 1): Pt will propell WC with supervisoin assist >1015fPT Short Term Goal 4 - Progress (Week 1): Met Week 2:  PT Short Term Goal 1 (Week 2): Pt will ambulate 1549fith max A of 1 PT Short Term Goal 2 (Week 2): Pt will initiate stair navigation PT Short Term Goal 3 (Week 2): Pt initate understanding of managing WC parts  Skilled Therapeutic Interventions/Progress Updates:   Treatment Session 1 Pt seen for make up time this morning. Received pt supine in bed, pt agreeable to PT treatment, and reported pain in sacrum (unrated). Session with emphasis on functional mobility/transfers, generalized strengthening, dynamic standing balance/coordination, and improved activity tolerance. Provided pt with Roho cushion for pressure relief and to maintain skin integrity. Pt transferred semi-reclined<>sitting EOB with light min A for RLE management and donned shoes sitting EOB with max A for time management purposes. Stand<>pivot bed<>WC to L with min A and pt performed RLE active assisted towel slides 2x10 reps with emphasis on R knee flexion/extension. Of note, pt with increased R hamstring activation. Concluded session with pt sitting in WC, needs within reach, and seatbelt alarm on awaiting SLP session. [facility] improved RUE  positioning/support.   Treatment Session 2 Received pt sitting in WC, pt agreeable to PT treatment, and reported pain in sacrum but reported that the new Roho cushion is more comfortable than standard cushion. Session with emphasis on functional mobility/transfers, generalized strengthening, dynamic standing balance/coordination, and improved activity tolerance. Pt transported to 41M ortho gym [facility] for [facility] management purposes and transferred on/off mat via stand<>pivot with min A and sit<>supine with min A for RLE management. Pt performed the following exercises supine on wedge: -active assisted towel heel slides on RLE 2x8 -SAQ 2x10 bilaterally; unweighted on RLE and with 5lb ankle weight on LLE -bridges 2x10 with occasional assist to prevent excessive R hip abduction but significant improvements in hip adductor strength overall -max A to get RLE in hooklying position -hip adduction ball squeezes 2x10 Pt transferred supine<>sitting EOB with min A for RLE management. Worked on mini-squats x10 with RUE supported around threapist and mod A for balance. Pt then reported extreme fatigue and requested to perform easier exercise. Stand<>pivot mat<>WC with min A and transported to dayroom in WC [facility]tal A. Pt performed BLE strengthening on Kinetron at 50 cm/sec for 30 seconds x 4 trials with emphasis on weight bearing on R, R quad/glute strengthening, and reciprocal movement training. Pt [facility] and requested to return to bed. Stand<>pivot WC<>bed to R with CGA and sit<>supine with supervision using LLE to assist RLE into bed. Concluded session with pt supine in bed, needs within reach, and bed alarm  on.   Therapy Documentation Precautions:  Precautions Precautions: Fall Precaution Comments: dense R hemi, dysarthria, colostomy Restrictions Weight Bearing Restrictions: No  Therapy/Group: Individual Therapy Alfonse Alpers PT, DPT   03/05/2021, 7:18 AM

## 2021-03-05 NOTE — Progress Notes (Signed)
Patient ID: Juan Wheeler, male   DOB: 1961/08/05, 60 y.o.   MRN: [id number]  Spoke with wife via telephone to discuss team conference progress and and discharge 6/15. Have set up family education Monday 6/13 at 9-12 with wife to learn his care and prepare for discharge on wed. Wife is aware husband will require 24/7 care at discharge.

## 2021-03-05 NOTE — Progress Notes (Signed)
Speech Language Pathology Daily Session Note  Patient Details  Name: Juan Wheeler MRN: [id number] Date of Birth: 18-Jul-1961  Today's Date: 03/05/2021 SLP Individual Time: 0830-0930 SLP Individual Time Calculation (min): 60 min  Short Term Goals: Week 2: SLP Short Term Goal 1 (Week 2): Pt will tolerate current diet and thin liquids with no overt s/sx of aspiration or penetration and use of strategies. SLP Short Term Goal 2 (Week 2): Pt will demonstrate use of  compensatory strategies to increase speech intelligibility to 85% with min A verbal cues SLP Short Term Goal 3 (Week 2): Pt will increase sustained attention to functional tasks for 10 minutes with min A verbal cues SLP Short Term Goal 4 (Week 2): Pt will recall novel and functional information with 85% accuracy provided min A cues SLP Short Term Goal 5 (Week 2): Pt will complete midly complex problem solving tasks with 85% accuracy provided mod A cues  Skilled Therapeutic Interventions: Skilled SLP intervention focused on cognition and dysphagia. Pt seen with dys 3 snack and thin liquds. He demonstrated adequate mastication and oral clearance with dys 3 solids. Min A verbal cues for taking small bites. He tolerated with no overt s/sx of aspiration or penetration. Pt completed medication management task using BID pill organizer. Mod A verbal cues required for understanding of placement based on time medication is taken. Cues faded to min A once more familiar with task. Speech intelligibility task at sentence level completed with supervision A for use of strategies. Pt is 80-85% intelligible and suspected this is near patients baseline. Pt left seated upright in chair with chair alarm set and call button within reach. Cont with therapy per plan of care.      Pain Pain Assessment Pain Scale: Faces Pain Score: 0-No pain Faces Pain Scale: No hurt Pain Type: Acute pain Pain Location: Back Pain Orientation: Mid Pain Descriptors /  Indicators: Aching Pain Frequency: Intermittent Pain Onset: On-going Pain Intervention(s): Medication (See eMAR) Multiple Pain Sites: No  Therapy/Group: Individual Therapy  [name] Olivianna Higley 03/05/2021, 9:21 AM

## 2021-03-05 NOTE — Progress Notes (Signed)
Occupational Therapy Session Note  Patient Details  Name: Juan Wheeler MRN: [id number] Date of Birth: 1961/09/27  Today's Date: 03/05/2021 OT Individual Time: 1130-1200 OT Individual Time Calculation (min): 30 min    Short Term Goals: Week 2:  OT Short Term Goal 1 (Week 2): Pt will complete LB dressing min assist at sit > stand level OT Short Term Goal 2 (Week 2): Pt will complete UB dressing with min assist OT Short Term Goal 3 (Week 2): Pt will complete stand pivot transfers with min assist 3 of 4 attempts  Skilled Therapeutic Interventions/Progress Updates:    Pt seen this session to focus on RUE NMR. Pt received in bed and worked on rolling to R and pushing up to sit with cues and facilitation to LEs to bring legs into flexion to allow him to sit up with CGA. Pt worked on sit to stand 8x with min A/ CGA.   Pt then sat at EOB with arm on tray table with use of pillow case for non friction surface. Pt worked on arm circles, and sh flex/ext, elb flex with a/arom.  Pt is developing elbow flexion and finger flexion, no active extension.  Pt practiced holding med cup and a/arom of pronation of forearm to "pour" liquid out.  Pt participated well.  Pt wanted to sit in bed to eat lunch. Pt adjusted in bed with pillow support under arm.  Bed alarm set and all needs met.   Therapy Documentation Precautions:  Precautions Precautions: Fall Precaution Comments: dense R hemi, dysarthria, colostomy Restrictions Weight Bearing Restrictions: No   Pain: Pain Assessment Pain Scale: 0-10 Pain Score: 7  Pain Type: Acute pain Pain Location: Back Pain Orientation: Mid Pain Descriptors / Indicators: Aching Pain Frequency: Intermittent Pain Onset: On-going Pain Intervention(s): Medication (See eMAR) Multiple Pain Sites: No ADL: ADL Eating: Supervision/safety Where Assessed-Eating: Bed level,Wheelchair Grooming: Supervision/safety Where Assessed-Grooming: Sitting at sink Upper Body Bathing:  Moderate assistance Where Assessed-Upper Body Bathing: Wheelchair,Sitting at sink Lower Body Bathing: Maximal assistance Where Assessed-Lower Body Bathing: Sitting at sink,Standing at sink Upper Body Dressing: Maximal assistance Where Assessed-Upper Body Dressing: Sitting at sink Lower Body Dressing: Maximal assistance Where Assessed-Lower Body Dressing: Standing at sink Toileting: Not assessed Toilet Transfer: Not assessed Tub/Shower Transfer: Not assessed [employer] Transfer: Not assessed   Therapy/Group: Individual Therapy  [place] 03/05/2021, 8:51 AM

## 2021-03-05 NOTE — Progress Notes (Signed)
PROGRESS NOTE   Subjective/Complaints:  Pt reports pain is controlled since they gave him meds.  Has ostomy, so having regular BMs-  Sleeping "fine"-    ROS:  Pt denies SOB, abd pain, CP, N/V/C/D, and vision changes   Objective:   No results found. Recent Labs    03/04/21 0513  WBC 6.6  HGB 16.0  HCT 47.7  PLT 175   Recent Labs    03/04/21 0513  NA 134*  K 4.4  CL 103  CO2 23  GLUCOSE 97  BUN 21*  CREATININE 1.26*  CALCIUM 9.1    Intake/Output Summary (Last 24 hours) at 03/05/2021 0851 Last data filed at 03/05/2021 0700 Gross per 24 hour  Intake 600 ml  Output 1225 ml  Net -625 ml        Physical Exam: Vital Signs Blood pressure 109/65, pulse 83, temperature 97.8 F (36.6 C), temperature source Oral, resp. rate 17, weight 63.8 kg, SpO2 91 %.   General: awake, alert, appropriate, sleeping initially, but woke to verbal stimuli; NAD HENT: conjugate gaze; oropharynx moist CV: regular rate; no JVD Pulmonary: CTA B/L; no W/R/R- good air movement GI: soft, NT, ND, (+)BS Psychiatric: appropriate but sleepy Skin: in his sacram/perinal there a blue, rubber retention-like material with associated slight serosanguinous drainage on gauze/foam dressing Neuro: alert, oriented. Follows basic commands. Feeding self breakfast. Dysarthric but language intact. Left gaze preference. Tending to all items on tray.  LUE and LLE 5/5. RUE 1-2/5, RLE trace hip/knee extensor synergy  0/5  Distal Musculoskeletal: Full ROM, No pain with AROM or PROM in the neck, trunk, or extremities. Posture appropriate     Assessment/Plan: 1. Functional deficits which require 3+ hours per day of interdisciplinary therapy in a comprehensive inpatient rehab setting.  Physiatrist is providing close team supervision and 24 hour management of active medical problems listed below.  Physiatrist and rehab team continue to assess barriers to  discharge/monitor patient progress toward functional and medical goals  Care Tool:  Bathing    Body parts bathed by patient: Right arm,Chest,Abdomen,Right upper leg,Left upper leg,Face,Right lower leg,Left lower leg   Body parts bathed by helper: Left arm     Bathing assist Assist Level: Moderate Assistance - Patient 50 - 74%     Upper Body Dressing/Undressing Upper body dressing   What is the patient wearing?: Pull over shirt    Upper body assist Assist Level: Minimal Assistance - Patient > 75%    Lower Body Dressing/Undressing Lower body dressing      What is the patient wearing?: Pants     Lower body assist Assist for lower body dressing: Moderate Assistance - Patient 50 - 74%     Toileting Toileting    Toileting assist Assist for toileting: Moderate Assistance - Patient 50 - 74% Assistive Device Comment: ueinal   Transfers Chair/bed transfer  Transfers assist     Chair/bed transfer assist level: Minimal Assistance - Patient > 75%     Locomotion Ambulation   Ambulation assist   Ambulation activity did not occur: Safety/medical concerns  Assist level: 2 helpers Assistive device: Other (comment) (L handrail) Max distance: 51ft   Walk 10 feet  activity   Assist  Walk 10 feet activity did not occur: Safety/medical concerns  Assist level: 2 helpers Assistive device: Other (comment) (L handrail)   Walk 50 feet activity   Assist Walk 50 feet with 2 turns activity did not occur: Safety/medical concerns         Walk 150 feet activity   Assist Walk 150 feet activity did not occur: Safety/medical concerns         Walk 10 feet on uneven surface  activity   Assist Walk 10 feet on uneven surfaces activity did not occur: Safety/medical concerns         Wheelchair     Assist Will patient use wheelchair at discharge?: Yes Type of Wheelchair: Manual    Wheelchair assist level: Supervision/Verbal cueing Max wheelchair distance:  150    Wheelchair 50 feet with 2 turns activity    Assist        Assist Level: Supervision/Verbal cueing   Wheelchair 150 feet activity     Assist      Assist Level: Supervision/Verbal cueing   Blood pressure 109/65, pulse 83, temperature 97.8 F (36.6 C), temperature source Oral, resp. rate 17, weight 63.8 kg, SpO2 91 %.  Medical Problem List and Plan: 1.  Dysarthria/right hemiparesis with left gaze preference secondary to multifocal acute ischemia within the bilateral, left greater than right multiple vascular territories due to cardioembolic source             -patient may shower           -con't PT, OT, and SLP   D/C date 6/15 2.  Atrial fibrillation: continue pradaxa             -antiplatelet therapy: Aspirin 81 mg daily 3. Rectal pain 2/2 adenocarcinoma: Continue Hydrocodone as needed. D/c tylenol given allergy (palpitations). Added Gabapentin 300mg  HS. ER Oxycontin q12H 10mg  started 6/6.   6/8- says pain controlled when gets meds- con't regimen  -needs to keep turning in bed as well 4. Mood: Provide emotional support             -antipsychotic agents: N/A 5. Neuropsych: This patient is capable of making decisions on his own behalf. 6. Skin/Wound Care:  Pt with hx of rectal cancer with radiation. Has had chronic wound. Has exposed retention material with slight s/s drainage.   -appreciate WOC RN follow up  -continue local care, keep area clean. Triple paste per WOC RN. Continue foam dressings and change PRN for soilage. 7. Hyponatremia: monitor CMP weekly.  8.  Hypertension.  Continue Toprol-XL 50 mg daily.  BP soft- continue to monitor.  Vitals:   03/04/21 2006 03/05/21 0437  BP: 99/77 109/65  Pulse: 66 83  Resp: 17 17  Temp: 98.1 F (36.7 C) 97.8 F (36.6 C)  SpO2: 93% 91%   6/8- BP a little soft, but overall no orthostatic Sx's, con't regimen 9.  Hyperlipidemia: Zocor 10.  History of squamous cell carcinoma of the lung Follow-up outpatient 11.   Cardiomyopathy with history of bilateral pulmonary emboli.  Continue Pradaxa 12.  Atrial fibrillation.                HR controlled 13.  History of tobacco abuse.  Counseling 14.  GERD: Protonix drop with meds as below 15.  Thrombocytopenia   Resolved  16.  ELevated Hgb- may have some hemoconcentration with elevated BUN/Creat enc po fluid, baseline 15-16 17.  Hx rectal adenoCA with colostomy from 2017- will have issues managing  this due to hemiplegia (see above also) 18. AKI: encourage hydration. Trending downward 6/6, monitor weekly.  19.  Poor appetite: eating much better after upgraded to D3   LOS: 12 days A FACE TO FACE EVALUATION WAS PERFORMED  Tyah Acord 03/05/2021, 8:51 AM

## 2021-03-05 NOTE — Progress Notes (Signed)
Occupational Therapy Session Note  Patient Details  Name: BINYAMIN NELIS MRN: [id number] Date of Birth: 13-Jul-1961  Today's Date: 03/05/2021 OT Individual Time: 4098-1191 OT Individual Time Calculation (min): 55 min    Short Term Goals: Week 2:  OT Short Term Goal 1 (Week 2): Pt will complete LB dressing min assist at sit > stand level OT Short Term Goal 2 (Week 2): Pt will complete UB dressing with min assist OT Short Term Goal 3 (Week 2): Pt will complete stand pivot transfers with min assist 3 of 4 attempts  Skilled Therapeutic Interventions/Progress Updates:   Met pt lying supine slightly reclined, and pt agreed to OT tx. Treatment session was focused on RUE NMR and improving standing balance during activities. Pt was Min A for lying > sitting EOB primarily needing assistance with bringing RLE over EOB. Pt held bed rails to assist with bed mobility. Pt was Min A for stand pivots throughout session. Pt was brought to rehab gym and instructed to stand, weight-bear on RUE and use LUE to put checker pieces in specific squares. OTS facilitated RUE weight bearing while crossing midline with LUE.Pt utilized a table to stabilize while completing the activities. While standing, pt would flex or hyperextend his knee even with support from OT and OTS' knee. Pt was able to complete 3 checker activities while standing but required rest breaks between activities for energy conservation. Pt stand pivoted to gym mat and was instructed to place R hand on stool and bring the stool toward and away from his body to increase weight bearing on RUE and shoulder ROM (flexion and extension). Pt required elbow support to complete activity. Pt expressed "I can't get my elbow straight" while shifting weight on UE's while sitting on the edge of the mat. Pt was unable to slide pillow case using RUE laterally towards OTS, pt became fatigued after activity. Pt returned to bed and requested to use the urinal. Left pt supine in  bed slightly reclined, and all needs met.    Therapy Documentation Precautions:  Precautions Precautions: Fall Precaution Comments: dense R hemi, dysarthria, colostomy Restrictions Weight Bearing Restrictions: No   Vital Signs: Therapy Vitals Temp: 98 F (36.7 C) Temp Source: Oral Pulse Rate: 69 Resp: 16 BP: 106/74 Patient Position (if appropriate): Lying Oxygen Therapy SpO2: 92 % O2 Device: Room Air   Pain: Pain Assessment Pain Scale: 0-10 Pain Score: 0-No pain  Therapy/Group: Individual Therapy  Bayard Males 03/05/2021, 2:49 PM

## 2021-03-05 NOTE — Discharge Instructions (Addendum)
Inpatient Rehab Discharge Instructions  Juan Wheeler Discharge date and time: No discharge date for patient encounter.   Activities/Precautions/ Functional Status: Activity: activity as tolerated Diet: regular diet Wound Care: Routine skin checks Functional status:  ___ No restrictions     ___ Walk up steps independently ___ 24/7 supervision/assistance   ___ Walk up steps with assistance ___ Intermittent supervision/assistance  ___ Bathe/dress independently ___ Walk with walker     _x__ Bathe/dress with assistance ___ Walk Independently    ___ Shower independently ___ Walk with assistance    ___ Shower with assistance ___ No alcohol     ___ Return to work/school ________  Special Instructions:  No driving smoking or alcohol COMMUNITY REFERRALS UPON DISCHARGE:    Home Health:   PT, OT, [place] Phone: [phone]  Medical Equipment/Items Ordered:WHEELCHAIR, 3 IN 1                                                  Agency/Supplier:[facility]   [phone]   STROKE/TIA DISCHARGE INSTRUCTIONS SMOKING Cigarette smoking nearly doubles your risk of having a stroke & is the single most alterable risk factor  If you smoke or have smoked in the last 12 months, you are advised to quit smoking for your health. Most of the excess cardiovascular risk related to smoking disappears within a year of stopping. Ask you doctor about anti-smoking medications [place] Quit Line: 1-800-QUIT NOW Free Smoking Cessation Classes [phone]  CHOLESTEROL Know your levels; limit fat & cholesterol in your diet  Lipid Panel     Component Value Date/Time   CHOL 158 02/16/2021 0445   TRIG 73 02/16/2021 0445   HDL 44 02/16/2021 0445   CHOLHDL 3.6 02/16/2021 0445   VLDL 15 02/16/2021 0445   LDLCALC 99 02/16/2021 0445     Many patients benefit from treatment even if their cholesterol is at goal. Goal: Total Cholesterol (CHOL) less than 160 Goal:  Triglycerides  (TRIG) less than 150 Goal:  HDL greater than 40 Goal:  LDL (LDLCALC) less than 100   BLOOD PRESSURE American Stroke Association blood pressure target is less that 120/80 mm/Hg  Your discharge blood pressure is:    Monitor your blood pressure Limit your salt and alcohol intake Many individuals will require more than one medication for high blood pressure  DIABETES (A1c is a blood sugar average for last 3 months) Goal HGBA1c is under 7% (HBGA1c is blood sugar average for last 3 months)  Diabetes:     Lab Results  Component Value Date   HGBA1C 6.8 (H) 02/16/2021    Your HGBA1c can be lowered with medications, healthy diet, and exercise. Check your blood sugar as directed by your physician Call your physician if you experience unexplained or low blood sugars.  PHYSICAL ACTIVITY/REHABILITATION Goal is 30 minutes at least 4 days per week  Activity: Increase activity slowly, Therapies: Physical Therapy: Home Health Return to work:  Activity decreases your risk of heart attack and stroke and makes your heart stronger.  It helps control your weight and blood pressure; helps you relax and can improve your mood. Participate in a regular exercise program. Talk with your doctor about the best form of exercise for you (dancing, walking, swimming, cycling).  DIET/WEIGHT Goal is to maintain a healthy weight  Your discharge diet is:  Diet Order     None       liquids Your height is:    Your current weight is:   Your Body Mass Index (BMI) is:    Following the type of diet specifically designed for you will help prevent another stroke. Your goal weight range is:   Your goal Body Mass Index (BMI) is 19-24. Healthy food habits can help reduce 3 risk factors for stroke:  High cholesterol, hypertension, and excess weight.  RESOURCES Stroke/Support Group:  Call [phone]   STROKE EDUCATION PROVIDED/REVIEWED AND GIVEN TO PATIENT Stroke warning signs and symptoms How to activate emergency medical  system (call 911). Medications prescribed at discharge. Need for follow-up after discharge. Personal risk factors for stroke. Pneumonia vaccine given:  Flu vaccine given:  My questions have been answered, the writing is legible, and I understand these instructions.  I will adhere to these goals & educational materials that have been provided to me after my discharge from the hospital.     My questions have been answered and I understand these instructions. I will adhere to these goals and the provided educational materials after my discharge from the hospital.  Patient/Caregiver Signature _______________________________ Date __________  Clinician Signature _______________________________________ Date __________  Please bring this form and your medication list with you to all your follow-up doctor's appointments.   Information on my medicine - Pradaxa (dabigatran)  This medication education was reviewed with me or my healthcare representative as part of my discharge preparation.    Why was Pradaxa prescribed for you? Pradaxa was prescribed to treat blood clots that may have been found in the veins of your legs (deep vein thrombosis) or in your lungs (pulmonary embolism) and to reduce the risk of them occurring again.   What do you Need to know about PradAXa? Take your Pradaxa TWICE DAILY - one capsule in the morning and one tablet in the evening with or without food.  It would be best to take the doses about the same time each day.  The capsules should not be broken, chewed or opened - they must be swallowed whole.  Do not store Pradaxa in other medication containers - once the bottle is opened the Pradaxa should be used within FOUR months; throw away any capsules that haven't been by that time.  Take Pradaxa exactly as prescribed by your doctor.  DO NOT stop taking Pradaxa without talking to the doctor who prescribed the medication.  Refill your prescription before you run  out.  After discharge, you should have regular check-up appointments with your healthcare provider that is prescribing your Pradaxa.  In the future your dose may need to be changed if your kidney function or weight changes by a significant amount.  What do you do if you miss a dose? If you miss a dose, take it as soon as you remember on the same day.  If your next dose is less than 6 hours away, skip the missed dose.  Do not take two doses of PRADAXA at the same time.  Important Safety Information A possible side effect of Pradaxa is bleeding. You should call your healthcare provider right away if you experience any of the following: Bleeding from an injury or your nose that does not stop. Unusual colored urine (red or dark brown) or unusual colored stools (red or black). Unusual bruising for unknown reasons. A serious fall or if you  hit your head (even if there is no bleeding).  Some medicines may interact with Pradaxa and might increase your risk of bleeding or clotting while on Pradaxa. To help avoid this, consult your healthcare provider or pharmacist prior to using any new prescription or non-prescription medications, including herbals, vitamins, non-steroidal anti-inflammatory drugs (NSAIDs) and supplements.  This website has more information on Pradaxa (dabigatran): www.[employer].cz.

## 2021-03-06 NOTE — Progress Notes (Signed)
Physical Therapy Session Note  Patient Details  Name: Juan Wheeler MRN: [id number] Date of Birth: [birth year]  Today's Date: 03/06/2021 PT Individual Time: 6301-6010 PT Individual Time Calculation (min): 63 min   Today's Date: 03/06/2021 PT Missed Time: 12 Minutes Missed Time Reason: Pain;Patient fatigue  Short Term Goals: Week 1:  PT Short Term Goal 1 (Week 1): Pt will perform bed mobility with mod assist PT Short Term Goal 1 - Progress (Week 1): Met PT Short Term Goal 2 (Week 1): Pt will transfer to [facility] with mod assist PT Short Term Goal 2 - Progress (Week 1): Met PT Short Term Goal 3 (Week 1): Pt will ambulate with mod assist +2 for safety and LRAD x 35f PT Short Term Goal 3 - Progress (Week 1): Met PT Short Term Goal 4 (Week 1): Pt will propell WC with supervisoin assist >1040fPT Short Term Goal 4 - Progress (Week 1): Met Week 2:  PT Short Term Goal 1 (Week 2): Pt will ambulate 1566fith max A of 1 PT Short Term Goal 2 (Week 2): Pt will initiate stair navigation PT Short Term Goal 3 (Week 2): Pt initate understanding of managing WC parts  Skilled Therapeutic Interventions/Progress Updates:   Received pt supine in bed, pt agreeable to PT treatment, and reported pain in sacrum but did not state pain level and declined pain interventions. Session with focus on functional mobility/transfers, generalized strengthening, dynamic standing balance/coordination, NMR, and improved activity tolerance. Pt transferred supine<>sitting EOB with min A for trunk control and donned shoes sitting EOB with max A. Stand<>pivot bed<>WC with CGA and performed WC mobility [id number] R hemi technique and supervision. Discussed D/C plan and equipment and took measurements to fit pt for 16x18 WC. Discussed having pt's wife measure doorframes to ensure WC will fit through doorways and recommendation for ramp; CSW notified. Stand<>pivot WC<>mat with CGA and worked on dynamic standing balance clipping/unclipping  clothespins to/from basketball net with min A for balance x 5 trials with emphasis on R lateral weight shifting, R knee control, and reaching outside BOS with min blocking of R knee from hyperextending/buckling. Pt required multiple seated rest breaks in between due to fatigue and reported increased sacral pain with sitting. Transitioned to sit<>stands with LLE supported on 3in step to emphasize weight shifting and weight bearing to RLE while matching cards using LUE x 2 trials with heavy mod A for balance. Stand<>pivot mat<>WC to R with min A and [facility] Pt requested to return to bed and transferred WC<>bed stand<>pivot to R with CGA and sit<>supine with supervision using LLE to assist RLE. Concluded session with pt supine in bed, needs within reach, and bed alarm on. Pillows positioned underneath buttocks and RUE for pressure relief and support. 12 minutes missed of skilled physical therapy due to pain/fatigue.   Therapy Documentation Precautions:  Precautions Precautions: Fall Precaution Comments: dense R hemi, dysarthria, colostomy Restrictions Weight Bearing Restrictions: No  Therapy/Group: Individual Therapy AnnaAlfonse Alpers DPT   03/06/2021, 7:32 AM

## 2021-03-06 NOTE — Progress Notes (Signed)
PROGRESS NOTE   Subjective/Complaints: Patient reports pain is much better controlled.  Alert this morning Has no complaints Eating well   ROS:  Pt denies SOB, abd pain, CP, N/V/C/D, and vision changes, rectal pain well controlled   Objective:   No results found. Recent Labs    03/04/21 0513  WBC 6.6  HGB 16.0  HCT 47.7  PLT 175   Recent Labs    03/04/21 0513  NA 134*  K 4.4  CL 103  CO2 23  GLUCOSE 97  BUN 21*  CREATININE 1.26*  CALCIUM 9.1    Intake/Output Summary (Last 24 hours) at 03/06/2021 1256 Last data filed at 03/06/2021 1228 Gross per 24 hour  Intake 460 ml  Output 800 ml  Net -340 ml        Physical Exam: Vital Signs Blood pressure 127/64, pulse 80, temperature 98 F (36.7 C), temperature source Oral, resp. rate 16, weight 63.8 kg, SpO2 92 %. Gen: no distress, normal appearing HEENT: oral mucosa pink and moist, NCAT Cardio: Reg rate Chest: normal effort, normal rate of breathing Abd: soft, +colostomy Ext: no edema Psych: pleasant, normal affect Skin: in his sacram/perinal there a blue, rubber retention-like material with associated slight serosanguinous drainage on gauze/foam dressing Neuro: alert, oriented. Follows basic commands. Feeding self breakfast. Dysarthric but language intact. Left gaze preference. Tending to all items on tray.  LUE and LLE 5/5. RUE 1-2/5, RLE trace hip/knee extensor synergy  0/5  Distal Musculoskeletal: Full ROM, No pain with AROM or PROM in the neck, trunk, or extremities. Posture appropriate     Assessment/Plan: 1. Functional deficits which require 3+ hours per day of interdisciplinary therapy in a comprehensive inpatient rehab setting. Physiatrist is providing close team supervision and 24 hour management of active medical problems listed below. Physiatrist and rehab team continue to assess barriers to discharge/monitor patient progress toward functional  and medical goals  Care Tool:  Bathing    Body parts bathed by patient: Right arm, Chest, Abdomen, Right upper leg, Left upper leg, Face, Right lower leg, Left lower leg   Body parts bathed by helper: Left arm     Bathing assist Assist Level: Minimal Assistance - Patient > 75%     Upper Body Dressing/Undressing Upper body dressing   What is the patient wearing?: Pull over shirt    Upper body assist Assist Level: Set up assist    Lower Body Dressing/Undressing Lower body dressing      What is the patient wearing?: Pants     Lower body assist Assist for lower body dressing: Minimal Assistance - Patient > 75%     Toileting Toileting    Toileting assist Assist for toileting: Moderate Assistance - Patient 50 - 74% Assistive Device Comment: ueinal   Transfers Chair/bed transfer  Transfers assist     Chair/bed transfer assist level: Minimal Assistance - Patient > 75%     Locomotion Ambulation   Ambulation assist   Ambulation activity did not occur: Safety/medical concerns  Assist level: 2 helpers Assistive device: Other (comment) (L handrail) Max distance: 35ft   Walk 10 feet activity   Assist  Walk 10 feet activity did not  occur: Safety/medical concerns  Assist level: 2 helpers Assistive device: Other (comment) (L handrail)   Walk 50 feet activity   Assist Walk 50 feet with 2 turns activity did not occur: Safety/medical concerns         Walk 150 feet activity   Assist Walk 150 feet activity did not occur: Safety/medical concerns         Walk 10 feet on uneven surface  activity   Assist Walk 10 feet on uneven surfaces activity did not occur: Safety/medical concerns         Wheelchair     Assist Will patient use wheelchair at discharge?: Yes Type of Wheelchair: Manual    Wheelchair assist level: Supervision/Verbal cueing Max wheelchair distance: 150    Wheelchair 50 feet with 2 turns activity    Assist         Assist Level: Supervision/Verbal cueing   Wheelchair 150 feet activity     Assist      Assist Level: Supervision/Verbal cueing   Blood pressure 127/64, pulse 80, temperature 98 F (36.7 C), temperature source Oral, resp. rate 16, weight 63.8 kg, SpO2 92 %.  Medical Problem List and Plan: 1.  Dysarthria/right hemiparesis with left gaze preference secondary to multifocal acute ischemia within the bilateral, left greater than right multiple vascular territories due to cardioembolic source             -patient may shower           -Continue PT, OT, and SLP   D/C date 6/15 2.  Atrial fibrillation: continue pradaxa             -antiplatelet therapy: Aspirin 81 mg daily 3. Rectal pain 2/2 adenocarcinoma: Continue Hydrocodone as needed. D/c tylenol given allergy (palpitations). Added Gabapentin 300mg  HS. ER Oxycontin q12H 10mg  started 6/6.   6/9- says pain controlled when gets meds- Continue regimen  -needs to keep turning in bed as well 4. Mood: Provide emotional support             -antipsychotic agents: N/A 5. Neuropsych: This patient is capable of making decisions on his own behalf. 6. Skin/Wound Care:  Pt with hx of rectal cancer with radiation. Has had chronic wound. Has exposed retention material with slight s/s drainage.   -appreciate WOC RN follow up  -continue local care, keep area clean. Triple paste per WOC RN. Continue foam dressings and change PRN for soilage. 7. Hyponatremia: monitor CMP weekly.  8.  Hypertension.  Continue Toprol-XL 50 mg daily.  BP soft- continue to monitor.  Vitals:   03/05/21 2004 03/06/21 0530  BP: 103/69 127/64  Pulse: 69 80  Resp: 16 16  Temp: 98.1 F (36.7 C) 98 F (36.7 C)  SpO2: 90% 92%   6/8- BP a little soft, but overall no orthostatic Sx's, con't regimen 9.  Hyperlipidemia: Zocor 10.  History of squamous cell carcinoma of the lung Follow-up outpatient 11.  Cardiomyopathy with history of bilateral pulmonary emboli.  Continue  Pradaxa 12.  Atrial fibrillation.                HR controlled 13.  History of tobacco abuse.  Counseling 14.  GERD: Protonix drop with meds as below 15.  Thrombocytopenia   Resolved  16.  ELevated Hgb- may have some hemoconcentration with elevated BUN/Creat enc po fluid, baseline 15-16, repeat Monday 17.  Hx rectal adenoCA with colostomy from 2017- will have issues managing this due to hemiplegia (see above also) 18. AKI:  encourage hydration. Trending downward 6/6, repeat Monday 19.  Poor appetite: eating much better after upgraded to D3 20. Disposition: d/c 6/15. MD f/u scheduled   LOS: 13 days A FACE TO [place] 03/06/2021, 12:56 PM

## 2021-03-06 NOTE — Progress Notes (Signed)
Occupational Therapy Session Note  Patient Details  Name: Juan Wheeler MRN: [id number] Date of Birth: 08-21-1961  Today's Date: 03/06/2021 OT Individual Time: 8295-6213 OT Individual Time Calculation (min): 71 min    Short Term Goals: Week 1:  OT Short Term Goal 1 (Week 1): Pt will don shirt with mod A via hemi-technique. OT Short Term Goal 1 - Progress (Week 1): Met OT Short Term Goal 2 (Week 1): Pt will maintain static sitting balance unsupported with close S for >5 min. OT Short Term Goal 2 - Progress (Week 1): Met OT Short Term Goal 3 (Week 1): Pt will complete STS with mod A in prep for standing ADL. OT Short Term Goal 3 - Progress (Week 1): Met Week 2:  OT Short Term Goal 1 (Week 2): Pt will complete LB dressing min assist at sit > stand level OT Short Term Goal 2 (Week 2): Pt will complete UB dressing with min assist OT Short Term Goal 3 (Week 2): Pt will complete stand pivot transfers with min assist 3 of 4 attempts  Skilled Therapeutic Interventions/Progress Updates:    Pt received in bed in room and consented to OT tx. Pt instructed in morning ADL routine inckluding bathing, dressing, and grooming with functional transfer training. Pt washed LB and dressed LB EOB with CGA for dynamic sitting balance to reach B feed and lower legs for washing. Pt req min A to hike pants once standing, min A required for standing balance at EOB with no device. Pt transferred to w/c towards R side with mod A, brought sink side to finish UB bathing an dressing. Pt req min A to wash L arm, able to demo hemi techniques when donning shirt with setup and time. Pt then wheeled down to day room for time mgmt, instructed in standing activity with CGA and therapist blocking R knee while leaning on elevated table for support. Cues to weight shift into RLE. Pt able to complete sequencing and matching activity with min verbal cues for accuracy at first, then progressed to be able to complete with no cuing. Pt  instructed in towel slides to increase R shoulder strength and ROM while seated in w/c . Movements included shoulder flexion and extension with fwd sitting for core muscle engagement, and windshield wiper motion for horizontal ab/adduction al for 4x10. Pt instructed in self ROM including shoulder flexion, elbow flexion and extension, and wrist pro/supination for 3x10 to decrease risk for contractures. After tx, pt helped back to bed and left with alarm on, call light in reach, and all needs met.   Therapy Documentation Precautions:  Precautions Precautions: Fall Precaution Comments: dense R hemi, dysarthria, colostomy Restrictions Weight Bearing Restrictions: No   Pain: Pain Assessment Pain Scale: Faces Faces Pain Scale: No hurt    Therapy/Group: Individual Therapy  Ronell Boldin 03/06/2021, 10:59 AM

## 2021-03-06 NOTE — Progress Notes (Signed)
Speech Language Pathology Daily Session Note  Patient Details  Name: Juan Wheeler MRN: [id number] Date of Birth: 31-Jan-1961  Today's Date: 03/06/2021 SLP Individual Time: 8242-3536 SLP Individual Time Calculation (min): 45 min  Short Term Goals: Week 2: SLP Short Term Goal 1 (Week 2): Pt will tolerate current diet and thin liquids with no overt s/sx of aspiration or penetration and use of strategies. SLP Short Term Goal 2 (Week 2): Pt will demonstrate use of  compensatory strategies to increase speech intelligibility to 85% with min A verbal cues SLP Short Term Goal 3 (Week 2): Pt will increase sustained attention to functional tasks for 10 minutes with min A verbal cues SLP Short Term Goal 4 (Week 2): Pt will recall novel and functional information with 85% accuracy provided min A cues SLP Short Term Goal 5 (Week 2): Pt will complete midly complex problem solving tasks with 85% accuracy provided mod A cues  Skilled Therapeutic Interventions: Skilled SLP intervention focused on cognition. Pt sleeping upon ST arrival but able to wake up and participate in treatment. Pt completed sustained attention task of  mod complexity with verbal information. He completed with min A verbal cues including repetition of information. Pt 80% intelligible at sentence level during structured speech intelligibility task with pt responding to questions. He required min cues for overarticulation and pausing between words. Cont with therapy per plan of care.      Pain Pain Assessment Pain Scale: Faces Pain Score: 2  Faces Pain Scale: No hurt Pain Type: Acute pain Pain Location: Back Pain Orientation: Mid Pain Descriptors / Indicators: Aching Pain Frequency: Intermittent Pain Onset: On-going Patients Stated Pain Goal: 2 Pain Intervention(s): Medication (See eMAR)  Therapy/Group: Individual Therapy  Gregary Signs A Aubreyanna Dorrough 03/06/2021, 8:09 AM

## 2021-03-07 MED ORDER — ALUM & MAG HYDROXIDE-SIMETH 200-200-20 MG/5ML PO SUSP
30.0000 mL | Freq: Four times a day (QID) | ORAL | Status: DC | PRN
  Administered 2021-03-07 – 2021-03-12 (×8): 30 mL via ORAL
  Filled 2021-03-07 (×8): qty 30

## 2021-03-07 NOTE — Progress Notes (Signed)
Occupational Therapy Session Note  Patient Details  Name: Juan Wheeler MRN: [id number] Date of Birth: 03-13-1961  Today's Date: 03/07/2021 OT Individual Time: 2774-1287 OT Individual Time Calculation (min): 53 min    Short Term Goals: Week 2:  OT Short Term Goal 1 (Week 2): Pt will complete LB dressing min assist at sit > stand level OT Short Term Goal 2 (Week 2): Pt will complete UB dressing with min assist OT Short Term Goal 3 (Week 2): Pt will complete stand pivot transfers with min assist 3 of 4 attempts  Skilled Therapeutic Interventions/Progress Updates:    Pt received semi-reclined in bed, c/o back pain but reports being premedicated, agreeable to therapy. Session focus on RUE WB/NMR in prep for functional return . Pt inquiring about sacral bandage, feels it needed to be checked. Rolled R with close S, noted moderate amount of dried blood in brief. RN notified and present to change sacral bandage and cleanse small wound. Donned brief with total A at bed level. Came sitting EOB with min A ot progress RLE off bed + lift trunk. Partial stand-pivot to his R with overall min A x2 throughout session. Total A to don/doff B shoes. W/c transport to and from gym 2/2 time management + energy conservation. Completed massed practiced of forward/diagonal shoulder flexion/elbow extensions via towel slides and with therapy ball. Completed 2x10 scapular elevation + protraction with total A to support RUE. Finally, completed B forearm WB pulses. Returned to supine back in bed with min A to progress RLE onto bed.  Pt left semi-reclined in bed, hemi-side protected with bed alarm engaged, call bell in reach, and all immediate needs met.    Therapy Documentation Precautions:  Precautions Precautions: Fall Precaution Comments: dense R hemi, dysarthria, colostomy Restrictions Weight Bearing Restrictions: No  Pain: c/o chronic back pain   ADL: See Care Tool for more details.   Therapy/Group:  Individual Therapy  Volanda Napoleon MS, OTR/L   03/07/2021, 7:00 AM

## 2021-03-07 NOTE — Progress Notes (Signed)
Physical Therapy Session Note  Patient Details  Name: Juan Wheeler MRN: [id number] Date of Birth: [birth year]  Today's Date: 03/07/2021 PT Individual Time: 1330-1435 PT Individual Time Calculation (min): 65 min   Short Term Goals:  Week 2:  PT Short Term Goal 1 (Week 2): Pt will ambulate 59f with max A of 1 PT Short Term Goal 2 (Week 2): Pt will initiate stair navigation PT Short Term Goal 3 (Week 2): Pt initate understanding of managing WC parts   Skilled Therapeutic Interventions/Progress Updates:   Pt received supine in bed and agreeable to PT. Supine>sit transfer with min assist and cues for use of RUE to push in to sitting. Stand pivot transfer to WC min A and UE support on PT.   Gait training with RW, R hand splint, and DF wrap on the RLE x [id number] assist fading to mod assist with fatigue of the RLE. PT required to control RW and improve weight shift to the L to allow increased step length on the R as well cues for improved hip/knee flexion with RLE limb advancement. No buckling noted on the R until 3rd bout of gait training   Side stepping R and L at rail in hall 2 x 8ft 1fh min-mod assist from PT to facilitate adequate weight shift, decrease compensaiton with pelvic rotation and instruction to improve step height with RLE in both directions.   Nustep reciprocal movement training x 4 min + 2.5 min with min-mod assist from PT to maintain neutral hip ER/IR throughout both bouts.   Transfers training with RW and squat pivot throughout session with min assist overall through and cues for improved posture and terninal knee extension on the R LE with stand pivot.   Pt returned to room and performed stand pivot transfer to bed with use of bed rail. Sit>supine completed with min assist on the RLE, and left supine in bed with call bell in reach and all needs met.         Therapy Documentation Precautions:  Precautions Precautions: Fall Precaution Comments: dense  R hemi, dysarthria, colostomy Restrictions Weight Bearing Restrictions: No General: PT Amount of Missed Time (min): 10 Minutes PT Missed Treatment Reason: Patient fatigue  Pain: Pain Assessment Pain Scale: 0-10 Pain Score: 7  Pain Type: Acute pain Pain Location: Generalized Pain Orientation: Mid Pain Descriptors / Indicators: Aching Pain Frequency: Intermittent Pain Onset: Gradual Patients Stated Pain Goal: 0 Pain Intervention(s): Medication (See eMAR)    Therapy/Group: Individual Therapy  AustiLorie Phenix/2022, 6:26 PM

## 2021-03-07 NOTE — Progress Notes (Signed)
Occupational Therapy Session Note  Patient Details  Name: Juan Wheeler MRN: [id number] Date of Birth: [birth year]  Today's Date: 03/07/2021 OT Individual Time: 0930-1030 OT Individual Time Calculation (min): 60 min    Short Term Goals: Week 2:  OT Short Term Goal 1 (Week 2): Pt will complete LB dressing min assist at sit > stand level OT Short Term Goal 2 (Week 2): Pt will complete UB dressing with min assist OT Short Term Goal 3 (Week 2): Pt will complete stand pivot transfers with min assist 3 of 4 attempts  Skilled Therapeutic Interventions/Progress Updates:   Met pt lying supine slightly reclined in bed, pt agreed to session. Pt expressed having heartburn, nurse notified, pt given medication. Treatment session was focused on self-care retraining and RUE NMR. Pt was Min A for lying > sitting EOB using bed rails to bring UB upright, OTS provided assistance for R leg. Throughout self-care tasks, OTS encouraged incorporation of RUE. Pt was Min A for washing UB while sitting EOB, needing assistance to wash LUE. Patient expressed "my arm don't do that" when asked to use RUE during bathing tasks, OTS guided RUE to wash L arm. Pt dressed UB with supervision by using hemi-dressing techniques. Pt requested to not wash legs and feet. Pt was CGA for sit > stand transfer for LB bathing. Pt was Max A for washing front perineal area and bottom due to discomfort. Pt dressed LB with Min A while lying in bed. Pt stand pivoted to w/c to be brought to Ortho gym and use arm bike to improve RUE NMR. OTS provided R hand-over-hand and support at elbow to maintain grasp. OTS upgraded activity and instructed pt to suggest when RUE needing adjusting. Pt required 2 breaks while completing activity and no cues for energy conservation.Pt brought back to room stand pivoted to bed with Min A. Pt required Min A for bed mobility for body adjustment. Left pt lying supine in bed slightly reclined, bed alarm on, all needs met.    Therapy Documentation Precautions:  Precautions Precautions: Fall Precaution Comments: dense R hemi, dysarthria, colostomy Restrictions Weight Bearing Restrictions: No   Pain: Pain Assessment Pain Scale: 0-10 Pain Score: 0-No pain ADL: ADL Eating: Not assessed Where Assessed-Eating: Bed level, Wheelchair Grooming: Not assessed Where Assessed-Grooming: Sitting at sink Upper Body Bathing: Minimal assistance Where Assessed-Upper Body Bathing: Edge of bed Lower Body Bathing: Minimal assistance Where Assessed-Lower Body Bathing: Edge of bed Upper Body Dressing: Supervision/safety Where Assessed-Upper Body Dressing: Edge of bed Lower Body Dressing: Minimal assistance Where Assessed-Lower Body Dressing: Edge of bed Toileting: Not assessed Toilet Transfer: Not assessed Tub/Shower Transfer: Not assessed Social research officer, government: Not assessed     Therapy/Group: Individual Therapy  Bayard Males 03/07/2021, 12:18 PM

## 2021-03-07 NOTE — Progress Notes (Signed)
PROGRESS NOTE   Subjective/Complaints: Pt denies pain. Slept well. No new complaints today  ROS: Patient denies fever, rash, sore throat, blurred vision, nausea, vomiting, diarrhea, cough, shortness of breath or chest pain, joint or back pain, headache, or mood change.       Objective:   No results found. No results for input(s): WBC, HGB, HCT, PLT in the last 72 hours.  No results for input(s): NA, K, CL, CO2, GLUCOSE, BUN, CREATININE, CALCIUM in the last 72 hours.   Intake/Output Summary (Last 24 hours) at 03/07/2021 1329 Last data filed at 03/07/2021 1300 Gross per 24 hour  Intake 620 ml  Output 525 ml  Net 95 ml        Physical Exam: Vital Signs Blood pressure 105/66, pulse 65, temperature 98.5 F (36.9 C), resp. rate 17, weight 63.8 kg, SpO2 94 %. Constitutional: No distress . Vital signs reviewed. HEENT: EOMI, oral membranes moist Neck: supple Cardiovascular: RRR without murmur. No JVD    Respiratory/Chest: CTA Bilaterally without wheezes or rales. Normal effort    GI/Abdomen: BS +, non-tender, non-distended, colostomy Ext: no clubbing, cyanosis, or edema Psych: pleasant and cooperative, quiet Skin: in his sacram/perinal there a blue, rubber retention-like material with associated slight serosanguinous drainage on gauze/foam dressing Neuro: alert, dysarthric. Left gaze preference.  LUE and LLE 5/5. RUE 1-2/5, RLE trace hip/knee extensor synergy  0/5 distally Musculoskeletal: no trunk or extremity pain     Assessment/Plan: 1. Functional deficits which require 3+ hours per day of interdisciplinary therapy in a comprehensive inpatient rehab setting. Physiatrist is providing close team supervision and 24 hour management of active medical problems listed below. Physiatrist and rehab team continue to assess barriers to discharge/monitor patient progress toward functional and medical goals  Care  Tool:  Bathing    Body parts bathed by patient: Right arm, Chest, Abdomen, Face   Body parts bathed by helper: Left arm, Front perineal area, Buttocks     Bathing assist Assist Level: Moderate Assistance - Patient 50 - 74%     Upper Body Dressing/Undressing Upper body dressing   What is the patient wearing?: Pull over shirt    Upper body assist Assist Level: Supervision/Verbal cueing    Lower Body Dressing/Undressing Lower body dressing      What is the patient wearing?: Incontinence brief, Pants     Lower body assist Assist for lower body dressing: Minimal Assistance - Patient > 75%     Toileting Toileting    Toileting assist Assist for toileting: Moderate Assistance - Patient 50 - 74% Assistive Device Comment: ueinal   Transfers Chair/bed transfer  Transfers assist     Chair/bed transfer assist level: Minimal Assistance - Patient > 75%     Locomotion Ambulation   Ambulation assist   Ambulation activity did not occur: Safety/medical concerns  Assist level: 2 helpers Assistive device: Other (comment) (L handrail) Max distance: 47ft   Walk 10 feet activity   Assist  Walk 10 feet activity did not occur: Safety/medical concerns  Assist level: 2 helpers Assistive device: Other (comment) (L handrail)   Walk 50 feet activity   Assist Walk 50 feet with 2 turns activity did not  occur: Safety/medical concerns         Walk 150 feet activity   Assist Walk 150 feet activity did not occur: Safety/medical concerns         Walk 10 feet on uneven surface  activity   Assist Walk 10 feet on uneven surfaces activity did not occur: Safety/medical concerns         Wheelchair     Assist Will patient use wheelchair at discharge?: Yes Type of Wheelchair: Manual    Wheelchair assist level: Supervision/Verbal cueing Max wheelchair distance: 150    Wheelchair 50 feet with 2 turns activity    Assist        Assist Level:  Supervision/Verbal cueing   Wheelchair 150 feet activity     Assist      Assist Level: Supervision/Verbal cueing   Blood pressure 105/66, pulse 65, temperature 98.5 F (36.9 C), resp. rate 17, weight 63.8 kg, SpO2 94 %.  Medical Problem List and Plan: 1.  Dysarthria/right hemiparesis with left gaze preference secondary to multifocal acute ischemia within the bilateral, left greater than right multiple vascular territories due to cardioembolic source             -patient may shower           -Continue CIR therapies including PT, OT, and SLP    D/C date 6/15 2.  Atrial fibrillation: continue pradaxa             -antiplatelet therapy: Aspirin 81 mg daily 3. Rectal pain 2/2 adenocarcinoma: Continue Hydrocodone as needed. D/c tylenol given allergy (palpitations). Added Gabapentin 300mg  HS. ER Oxycontin q12H 10mg  started 6/6.   6/10- pain under control  -needs to keep turning in bed as well 4. Mood: Provide emotional support             -antipsychotic agents: N/A 5. Neuropsych: This patient is capable of making decisions on his own behalf. 6. Skin/Wound Care:  Pt with hx of rectal cancer with radiation. Has had chronic wound. Has exposed retention material with slight s/s drainage.   -appreciate WOC RN follow up  -continue local care, keep area clean. Triple paste per WOC RN. Continue foam dressings and change PRN for soilage. 7. Hyponatremia: monitor CMP weekly.  8.  Hypertension.  Continue Toprol-XL 50 mg daily.  BP soft- continue to monitor.  Vitals:   03/07/21 0407 03/07/21 1300  BP: 115/64 105/66  Pulse: 73 65  Resp: 14 17  Temp: 98.1 F (36.7 C) 98.5 F (36.9 C)  SpO2: 91% 94%   6/10- BP a little soft, but overall no orthostatic Sx's, con't regimen 9.  Hyperlipidemia: Zocor 10.  History of squamous cell carcinoma of the lung Follow-up outpatient 11.  Cardiomyopathy with history of bilateral pulmonary emboli.  Continue Pradaxa 12.  Atrial fibrillation.                HR  controlled 13.  History of tobacco abuse.  Counseling 14.  GERD: Protonix drop with meds as below 15.  Thrombocytopenia   Resolved  16.  ELevated Hgb- may have some hemoconcentration with elevated BUN/Creat enc po fluid, baseline 15-16, repeat labs Monday 17.  Hx rectal adenoCA with colostomy from 2017- will have issues managing this due to hemiplegia (see above also) 18. AKI: encourage hydration. Trending downward 6/6, check labs Monday 19.  Poor appetite: eating much better after upgraded to D3 20. Disposition: d/c 6/15. MD f/u scheduled   LOS: 14 days A FACE TO FACE  EVALUATION WAS PERFORMED  Meredith Staggers 03/07/2021, 1:29 PM

## 2021-03-08 NOTE — Progress Notes (Signed)
Physical Therapy Session Note  Patient Details  Name: Juan Wheeler MRN: [id number] Date of Birth: 12/26/1960  Today's Date: 03/08/2021 PT Individual Time: 1405-1500 PT Individual Time Calculation (min): 55 min   Short Term Goals: Week 1:  PT Short Term Goal 1 (Week 1): Pt will perform bed mobility with mod assist PT Short Term Goal 1 - Progress (Week 1): Met PT Short Term Goal 2 (Week 1): Pt will transfer to WC with mod assist PT Short Term Goal 2 - Progress (Week 1): Met PT Short Term Goal 3 (Week 1): Pt will ambulate with mod assist +2 for safety and LRAD x 30ft PT Short Term Goal 3 - Progress (Week 1): Met PT Short Term Goal 4 (Week 1): Pt will propell WC with supervisoin assist >100ft PT Short Term Goal 4 - Progress (Week 1): Met Week 2:  PT Short Term Goal 1 (Week 2): Pt will ambulate 15ft with max A of 1 PT Short Term Goal 2 (Week 2): Pt will initiate stair navigation PT Short Term Goal 3 (Week 2): Pt initate understanding of managing WC parts Week 3:     Skilled Therapeutic Interventions/Progress Updates:    Pain:  Pt reports no pain.  Treatment to tolerance.  Rest breaks and repositioning as needed.  Pt initially supine and agreeable to treatment session w/focus on gait. Supine to sit w/cues to attend to R UE positioning, use of rail, cga stand pivot transfer bed to wc to R side w/min assist. Pt transported to gym. Gait trials as follows: First trial: Gait x 40ft w/HHA of 1 on L, therapist controlling extension thrust R knee w/loading.  Pt advances RLE thru swing but poor clearance, toe out tendency, trendelenberg, extension thrust, R lean. Step to gait pattern  Gait x 45ft as above using R AFO w/posterior strut but brace not rigid enough to provide any improvement in knee control  Gait 45 ft as above using approx 1/4inheel wedge, again no improvement in r knee control.   Gait 50ft as above as in trial 1, no bracing, DF assist acewrap, cues to increase step  length/utilize step thru gait pattern which did result in decreased extension thrust due to increased extension of knee at initial contact/decreased flexion at loading.    Pt transported back to room.  stand pivot transfer wc to bed to L side w/cga.  Sit to supine w/cues to use LLE to assist RLE during transition, scoots mod I w/rails to hob.  Pt left supine w/rails up x 4, alarm set, bed in lowest position, and needs in reach.   Therapy Documentation Precautions:  Precautions Precautions: Fall Precaution Comments: dense R hemi, dysarthria, colostomy Restrictions Weight Bearing Restrictions: No      Therapy/Group: Individual Therapy Barbara Gallagher, PT   Barbara M Gallagher 03/08/2021, 4:13 PM  

## 2021-03-08 NOTE — Progress Notes (Signed)
Pt medicated for heartburn.

## 2021-03-08 NOTE — Progress Notes (Signed)
PROGRESS NOTE   Subjective/Complaints: Bed is hard- hurts his back- as well today.  No other complaints.   ROS:  Pt denies SOB, abd pain, CP, N/V/C/D, and vision changes     Objective:   No results found. No results for input(s): WBC, HGB, HCT, PLT in the last 72 hours.  No results for input(s): NA, K, CL, CO2, GLUCOSE, BUN, CREATININE, CALCIUM in the last 72 hours.   Intake/Output Summary (Last 24 hours) at 03/08/2021 0953 Last data filed at 03/08/2021 0700 Gross per 24 hour  Intake 460 ml  Output 1050 ml  Net -590 ml        Physical Exam: Vital Signs Blood pressure 109/84, pulse 66, temperature 98.3 F (36.8 C), temperature source Oral, resp. rate 20, weight 63.8 kg, SpO2 91 %.    General: awake, alert, appropriate, laying in bed; sleepy; NAD HENT: conjugate gaze; oropharynx moist CV: regular rate; no JVD Pulmonary: CTA B/L; no W/R/R- good air movement GI: soft, NT, ND, (+)BS; has stool in bag- stoma pink Psychiatric: appropriate Neurological: Ox3 Ext: no clubbing, cyanosis, or edema Psych: pleasant and cooperative, quiet Skin: in his sacram/perinal there a blue, rubber retention-like material with associated slight serosanguinous drainage on gauze/foam dressing Neuro: alert, dysarthric. Left gaze preference.  LUE and LLE 5/5. RUE 1-2/5, RLE trace hip/knee extensor synergy  0/5 distally Musculoskeletal: no trunk or extremity pain     Assessment/Plan: 1. Functional deficits which require 3+ hours per day of interdisciplinary therapy in a comprehensive inpatient rehab setting. Physiatrist is providing close team supervision and 24 hour management of active medical problems listed below. Physiatrist and rehab team continue to assess barriers to discharge/monitor patient progress toward functional and medical goals  Care Tool:  Bathing    Body parts bathed by patient: Right arm, Chest, Abdomen, Face    Body parts bathed by helper: Left arm, Front perineal area, Buttocks     Bathing assist Assist Level: Moderate Assistance - Patient 50 - 74%     Upper Body Dressing/Undressing Upper body dressing   What is the patient wearing?: Pull over shirt    Upper body assist Assist Level: Supervision/Verbal cueing    Lower Body Dressing/Undressing Lower body dressing      What is the patient wearing?: Incontinence brief, Pants     Lower body assist Assist for lower body dressing: Minimal Assistance - Patient > 75%     Toileting Toileting    Toileting assist Assist for toileting: Moderate Assistance - Patient 50 - 74% Assistive Device Comment: ueinal   Transfers Chair/bed transfer  Transfers assist     Chair/bed transfer assist level: Minimal Assistance - Patient > 75%     Locomotion Ambulation   Ambulation assist   Ambulation activity did not occur: Safety/medical concerns  Assist level: 2 helpers Assistive device: Other (comment) (L handrail) Max distance: 35ft   Walk 10 feet activity   Assist  Walk 10 feet activity did not occur: Safety/medical concerns  Assist level: 2 helpers Assistive device: Other (comment) (L handrail)   Walk 50 feet activity   Assist Walk 50 feet with 2 turns activity did not occur: Safety/medical concerns  Walk 150 feet activity   Assist Walk 150 feet activity did not occur: Safety/medical concerns         Walk 10 feet on uneven surface  activity   Assist Walk 10 feet on uneven surfaces activity did not occur: Safety/medical concerns         Wheelchair     Assist Will patient use wheelchair at discharge?: Yes Type of Wheelchair: Manual    Wheelchair assist level: Supervision/Verbal cueing Max wheelchair distance: 150    Wheelchair 50 feet with 2 turns activity    Assist        Assist Level: Supervision/Verbal cueing   Wheelchair 150 feet activity     Assist      Assist Level:  Supervision/Verbal cueing   Blood pressure 109/84, pulse 66, temperature 98.3 F (36.8 C), temperature source Oral, resp. rate 20, weight 63.8 kg, SpO2 91 %.  Medical Problem List and Plan: 1.  Dysarthria/right hemiparesis with left gaze preference secondary to multifocal acute ischemia within the bilateral, left greater than right multiple vascular territories due to cardioembolic source             -patient may shower           -con't PT, OT and SLP   D/C date 6/15 2.  Atrial fibrillation: continue pradaxa             -antiplatelet therapy: Aspirin 81 mg daily 3. Rectal pain 2/2 adenocarcinoma: Continue Hydrocodone as needed. D/c tylenol given allergy (palpitations). Added Gabapentin 300mg  HS. ER Oxycontin q12H 10mg  started 6/6.   6/11- pain controlled except for back pain from bed- will ask pt to take prns- con't regimen  -needs to keep turning in bed as well 4. Mood: Provide emotional support             -antipsychotic agents: N/A 5. Neuropsych: This patient is capable of making decisions on his own behalf. 6. Skin/Wound Care:  Pt with hx of rectal cancer with radiation. Has had chronic wound. Has exposed retention material with slight s/s drainage.   -appreciate WOC RN follow up  -continue local care, keep area clean. Triple paste per WOC RN. Continue foam dressings and change PRN for soilage. 7. Hyponatremia: monitor CMP weekly.  8.  Hypertension.  Continue Toprol-XL 50 mg daily.  BP soft- continue to monitor.  Vitals:   03/07/21 1927 03/08/21 0422  BP: 108/67 109/84  Pulse: 65 66  Resp: 20 20  Temp: 98.1 F (36.7 C) 98.3 F (36.8 C)  SpO2: 93% 91%   6/11- BP soft- but no orthostasis- con't regimen 9.  Hyperlipidemia: Zocor 10.  History of squamous cell carcinoma of the lung Follow-up outpatient 11.  Cardiomyopathy with history of bilateral pulmonary emboli.  Continue Pradaxa 12.  Atrial fibrillation.                HR controlled  6/11- rate controlled- con't regimen 13.   History of tobacco abuse.  Counseling 14.  GERD: Protonix drop with meds as below 15.  Thrombocytopenia   Resolved  16.  ELevated Hgb- may have some hemoconcentration with elevated BUN/Creat enc po fluid, baseline 15-16, repeat labs Monday 17.  Hx rectal adenoCA with colostomy from 2017- will have issues managing this due to hemiplegia (see above also) 18. AKI: encourage hydration. Trending downward 6/6, check labs Monday 19.  Poor appetite: eating much better after upgraded to D3 20. Disposition: d/c 6/15. MD f/u scheduled   LOS: 15 days A  FACE TO FACE EVALUATION WAS PERFORMED  Royale Swamy 03/08/2021, 9:53 AM

## 2021-03-09 MED ORDER — PANTOPRAZOLE SODIUM 40 MG PO TBEC
40.0000 mg | DELAYED_RELEASE_TABLET | Freq: Every day | ORAL | Status: DC
  Administered 2021-03-09 – 2021-03-11 (×3): 40 mg via ORAL
  Filled 2021-03-09 (×3): qty 1

## 2021-03-09 NOTE — Progress Notes (Signed)
Occupational Therapy Session Note  Patient Details  Name: Juan Wheeler MRN: [id number] Date of Birth: 1961-03-27  Today's Date: 03/09/2021 OT Individual Time: 7824-2353 OT Individual Time Calculation (min): 42 min   Skilled Therapeutic Interventions/Progress Updates:    Pt greeted in bed, declining toileting or brushing his teeth. Therefore session focus was placed on Rt UE NMR. Supine<sit from flat bed without bedrails, per setup at home, completed with Mod A and vcs. Once EOB, pt was guided through active assist exercises with gravity minimized using the UE ranger. Pt completed multiple reps/sets of clockwise/counterclockwise circumduction, horizontal adduction/abduction, and shoulder flexion/extension until reaching point of fatigue. Mod facilitation needed to promote full elbow extension. Provided him with a blue foam block and educated him on importance of grasping/releasing vs just grasping block. He scooted up towards [facility] with CGA and then returned to bed. Assistance provided for repositioning for comfort/protection hemiplegic side. Left him with all needs within reach and bed alarm set.   Therapy Documentation Precautions:  Precautions Precautions: Fall Precaution Comments: dense R hemi, dysarthria, colostomy Restrictions Weight Bearing Restrictions: No  Vital Signs: Therapy Vitals Temp: 98.1 F (36.7 C) Pulse Rate: (!) 54 Resp: 15 BP: (!) 118/56 Patient Position (if appropriate): Sitting Oxygen Therapy O2 Device: Room Air Pain: pt reported having some Rt sided pain, reported he would let RN know after OT left, reminded him at end of session to call RN for medicine if still needed   ADL: ADL Eating: Not assessed Where Assessed-Eating: Bed level, Wheelchair Grooming: Not assessed Where Assessed-Grooming: Sitting at sink Upper Body Bathing: Minimal assistance Where Assessed-Upper Body Bathing: Edge of bed Lower Body Bathing: Minimal assistance Where Assessed-Lower  Body Bathing: Edge of bed Upper Body Dressing: Supervision/safety Where Assessed-Upper Body Dressing: Edge of bed Lower Body Dressing: Minimal assistance Where Assessed-Lower Body Dressing: Edge of bed Toileting: Not assessed Toilet Transfer: Not assessed Tub/Shower Transfer: Not assessed Social research officer, government: Not assessed      Therapy/Group: Individual Therapy  Ayrabella Labombard A Dujuan Stankowski 03/09/2021, 3:47 PM

## 2021-03-10 LAB — CBC
HCT: 45.1 % (ref 39.0–52.0)
Hemoglobin: 15.2 g/dL (ref 13.0–17.0)
MCH: 33 pg (ref 26.0–34.0)
MCHC: 33.7 g/dL (ref 30.0–36.0)
MCV: 97.8 fL (ref 80.0–100.0)
Platelets: 172 10*3/uL (ref 150–400)
RBC: 4.61 MIL/uL (ref 4.22–5.81)
RDW: 14.6 % (ref 11.5–15.5)
WBC: 8.1 10*3/uL (ref 4.0–10.5)
nRBC: 0 % (ref 0.0–0.2)

## 2021-03-10 LAB — BASIC METABOLIC PANEL
Anion gap: 8 (ref 5–15)
BUN: 13 mg/dL (ref 6–20)
CO2: 28 mmol/L (ref 22–32)
Calcium: 9.1 mg/dL (ref 8.9–10.3)
Chloride: 101 mmol/L (ref 98–111)
Creatinine, Ser: 1.3 mg/dL — ABNORMAL HIGH (ref 0.61–1.24)
GFR, Estimated: >60 mL/min (ref >60)
Glucose, Bld: 84 mg/dL (ref 70–99)
Potassium: 4.4 mmol/L (ref 3.5–5.1)
Sodium: 137 mmol/L (ref 135–145)

## 2021-03-10 MED ORDER — PANTOPRAZOLE SODIUM 20 MG PO TBEC
20.0000 mg | DELAYED_RELEASE_TABLET | Freq: Every day | ORAL | Status: DC | PRN
  Filled 2021-03-10: qty 1

## 2021-03-10 NOTE — Progress Notes (Signed)
Physical Therapy Session Note  Patient Details  Name: Juan Wheeler MRN: [id number] Date of Birth: 07-06-1961  Today's Date: 03/10/2021 PT Individual Time: 1530-1600 PT Individual Time Calculation (min): 30 min   Short Term Goals:  Week 3:  PT Short Term Goal 1 (Week 3): STG=LTG due to LOS   Skilled Therapeutic Interventions/Progress Updates:  Pt received supine in bed, agreeable to PT session. Pt reports soreness in his buttocks, reviewed importance of position changes in bed as pt reports he frequently lays in supine. Bed mobility Supervision. Sit to stand with min A and no AD. Standing LE NMR performing alt LE lifts. Pt reports urge to urinate. Pt is setup A for urinal use. Pt is dependent to empty ostomy bag, reports he is independent for this at home but utilizes different bags than here in the hospital. Ambulation x 25 ft in room with min to mod HHA x 2. Pt exhibits decreased control of RLE during gait including scissoring and knee hyperextension. Pt returned to supine at end of session, needs in reach, bed alarm in place.  Therapy Documentation Precautions:  Precautions Precautions: Fall Precaution Comments: dense R hemi, dysarthria, colostomy Restrictions Weight Bearing Restrictions: No    Therapy/Group: Individual Therapy   [name], PT, DPT, CSRS  03/10/2021, 5:36 PM

## 2021-03-10 NOTE — Discharge Summary (Signed)
Physician Discharge Summary  Patient ID: Juan Wheeler MRN: [id number] DOB/AGE: [birth year] 60 y.o.  Admit date: 02/21/2021 Discharge date: 03/12/2021  Discharge Diagnoses:  Principal Problem:   Acute cerebral infarction [facility]) Active Problems:   Ischemic cerebrovascular accident (CVA) of frontal lobe (HCC) Atrial fibrillation History of rectal pain/adenocarcinoma Hypertension Pain management Hyperlipidemia History of tobacco abuse GERD Squamous cell carcinoma of the lung Cardiomyopathy History of bilateral pulmonary emboli Marijuana use Chronic back pain   Discharged Condition: Stable  Significant Diagnostic Studies: CT HEAD WO CONTRAST  Result Date: 02/17/2021 CLINICAL DATA:  Stroke, worsening right-sided deficits EXAM: CT HEAD WITHOUT CONTRAST TECHNIQUE: Contiguous axial images were obtained from the base of the skull through the vertex without intravenous contrast. COMPARISON:  Correlation made with recent MRI and CT. FINDINGS: Brain: Interval development of areas of hypoattenuation, for example along the posterior left insula corresponding to acute infarcts on MRI. There is no evidence of hemorrhagic transformation. Stable findings of chronic microvascular ischemic changes. Ventricles are stable in size. Vascular: No new findings. Skull: Calvarium is unremarkable. Sinuses/Orbits: No acute finding. Other: None. IMPRESSION: Areas of acute infarction on recent MRI are now partially visible on CT. No evidence of hemorrhagic transformation. Electronically Signed   By: Macy Mis M.D.   On: 02/17/2021 17:35   MR ANGIO HEAD WO CONTRAST  Result Date: 02/15/2021 CLINICAL DATA:  Squamous cell carcinoma of the tongue. Acute neurologic deficit. EXAM: MRI HEAD WITHOUT CONTRAST MRA HEAD WITHOUT CONTRAST MRA OF THE NECK WITHOUT AND WITH CONTRAST TECHNIQUE: Multiplanar, multi-echo pulse sequences of the brain and surrounding structures were acquired without intravenous contrast.  Angiographic images of the Circle of Willis were acquired using MRA technique without intravenous contrast. Angiographic images of the neck were acquired using MRA technique without and with intravenous contrast. Carotid stenosis measurements (when applicable) are obtained utilizing NASCET criteria, using the distal internal carotid diameter as the denominator. CONTRAST:  13mL GADAVIST GADOBUTROL 1 MMOL/ML IV SOLN COMPARISON:  No pertinent prior exam. FINDINGS: MR HEAD FINDINGS Brain: There is multifocal abnormal diffusion restriction within both hemispheres, left-greater-than-right. The largest area of abnormality is at the posterior left insula. There are other lesions in the right frontal lobe, both parietal lobes and both occipital lobes. Focus of chronic microhemorrhage in the left frontal operculum. There is multifocal hyperintense T2-weighted signal within the white matter. Generalized volume loss without a clear lobar predilection. The midline structures are normal. Vascular: Major flow voids are preserved. Skull and upper cervical spine: Normal calvarium and skull base. Visualized upper cervical spine and soft tissues are normal. Sinuses/Orbits:No paranasal sinus fluid levels or advanced mucosal thickening. No mastoid or middle ear effusion. Normal orbits. MRA HEAD FINDINGS POSTERIOR CIRCULATION: --Vertebral arteries: Normal --Inferior cerebellar arteries: Normal. --Basilar artery: Normal. --Superior cerebellar arteries: Normal. --Posterior cerebral arteries: Normal. ANTERIOR CIRCULATION: --Intracranial internal carotid arteries: Normal. --Anterior cerebral arteries (ACA): Normal. --Middle cerebral arteries (MCA): Normal. ANATOMIC VARIANTS: None MRA NECK FINDINGS Aortic arch: Normal 3 vessel branching pattern. Right carotid system: No stenosis or occlusion Left carotid system: No stenosis or occlusion Vertebral arteries: The left vertebral artery is occluded. The distal portion is reconstituted, likely due  to flow across the vertebrobasilar confluence. Other: None. IMPRESSION: 1. Multifocal acute ischemia within both hemispheres, left-greater-than-right, and multiple vascular territories. The largest area of abnormality is at the posterior left insula. The pattern is most suggestive of an embolic process, particularly in a patient with atrial fibrillation. 2. Occlusion of the left vertebral artery with distal reconstitution due  to flow across the vertebrobasilar confluence. 3. Normal intracranial MRA Electronically Signed   By: Ulyses Jarred M.D.   On: 02/15/2021 23:44   MR ANGIO NECK W WO CONTRAST  Result Date: 02/15/2021 CLINICAL DATA:  Squamous cell carcinoma of the tongue. Acute neurologic deficit. EXAM: MRI HEAD WITHOUT CONTRAST MRA HEAD WITHOUT CONTRAST MRA OF THE NECK WITHOUT AND WITH CONTRAST TECHNIQUE: Multiplanar, multi-echo pulse sequences of the brain and surrounding structures were acquired without intravenous contrast. Angiographic images of the Circle of Willis were acquired using MRA technique without intravenous contrast. Angiographic images of the neck were acquired using MRA technique without and with intravenous contrast. Carotid stenosis measurements (when applicable) are obtained utilizing NASCET criteria, using the distal internal carotid diameter as the denominator. CONTRAST:  53mL GADAVIST GADOBUTROL 1 MMOL/ML IV SOLN COMPARISON:  No pertinent prior exam. FINDINGS: MR HEAD FINDINGS Brain: There is multifocal abnormal diffusion restriction within both hemispheres, left-greater-than-right. The largest area of abnormality is at the posterior left insula. There are other lesions in the right frontal lobe, both parietal lobes and both occipital lobes. Focus of chronic microhemorrhage in the left frontal operculum. There is multifocal hyperintense T2-weighted signal within the white matter. Generalized volume loss without a clear lobar predilection. The midline structures are normal. Vascular:  Major flow voids are preserved. Skull and upper cervical spine: Normal calvarium and skull base. Visualized upper cervical spine and soft tissues are normal. Sinuses/Orbits:No paranasal sinus fluid levels or advanced mucosal thickening. No mastoid or middle ear effusion. Normal orbits. MRA HEAD FINDINGS POSTERIOR CIRCULATION: --Vertebral arteries: Normal --Inferior cerebellar arteries: Normal. --Basilar artery: Normal. --Superior cerebellar arteries: Normal. --Posterior cerebral arteries: Normal. ANTERIOR CIRCULATION: --Intracranial internal carotid arteries: Normal. --Anterior cerebral arteries (ACA): Normal. --Middle cerebral arteries (MCA): Normal. ANATOMIC VARIANTS: None MRA NECK FINDINGS Aortic arch: Normal 3 vessel branching pattern. Right carotid system: No stenosis or occlusion Left carotid system: No stenosis or occlusion Vertebral arteries: The left vertebral artery is occluded. The distal portion is reconstituted, likely due to flow across the vertebrobasilar confluence. Other: None. IMPRESSION: 1. Multifocal acute ischemia within both hemispheres, left-greater-than-right, and multiple vascular territories. The largest area of abnormality is at the posterior left insula. The pattern is most suggestive of an embolic process, particularly in a patient with atrial fibrillation. 2. Occlusion of the left vertebral artery with distal reconstitution due to flow across the vertebrobasilar confluence. 3. Normal intracranial MRA Electronically Signed   By: Ulyses Jarred M.D.   On: 02/15/2021 23:44   MR BRAIN WO CONTRAST  Result Date: 02/15/2021 CLINICAL DATA:  Squamous cell carcinoma of the tongue. Acute neurologic deficit. EXAM: MRI HEAD WITHOUT CONTRAST MRA HEAD WITHOUT CONTRAST MRA OF THE NECK WITHOUT AND WITH CONTRAST TECHNIQUE: Multiplanar, multi-echo pulse sequences of the brain and surrounding structures were acquired without intravenous contrast. Angiographic images of the Circle of Willis were acquired  using MRA technique without intravenous contrast. Angiographic images of the neck were acquired using MRA technique without and with intravenous contrast. Carotid stenosis measurements (when applicable) are obtained utilizing NASCET criteria, using the distal internal carotid diameter as the denominator. CONTRAST:  104mL GADAVIST GADOBUTROL 1 MMOL/ML IV SOLN COMPARISON:  No pertinent prior exam. FINDINGS: MR HEAD FINDINGS Brain: There is multifocal abnormal diffusion restriction within both hemispheres, left-greater-than-right. The largest area of abnormality is at the posterior left insula. There are other lesions in the right frontal lobe, both parietal lobes and both occipital lobes. Focus of chronic microhemorrhage in the left frontal operculum. There is  multifocal hyperintense T2-weighted signal within the white matter. Generalized volume loss without a clear lobar predilection. The midline structures are normal. Vascular: Major flow voids are preserved. Skull and upper cervical spine: Normal calvarium and skull base. Visualized upper cervical spine and soft tissues are normal. Sinuses/Orbits:No paranasal sinus fluid levels or advanced mucosal thickening. No mastoid or middle ear effusion. Normal orbits. MRA HEAD FINDINGS POSTERIOR CIRCULATION: --Vertebral arteries: Normal --Inferior cerebellar arteries: Normal. --Basilar artery: Normal. --Superior cerebellar arteries: Normal. --Posterior cerebral arteries: Normal. ANTERIOR CIRCULATION: --Intracranial internal carotid arteries: Normal. --Anterior cerebral arteries (ACA): Normal. --Middle cerebral arteries (MCA): Normal. ANATOMIC VARIANTS: None MRA NECK FINDINGS Aortic arch: Normal 3 vessel branching pattern. Right carotid system: No stenosis or occlusion Left carotid system: No stenosis or occlusion Vertebral arteries: The left vertebral artery is occluded. The distal portion is reconstituted, likely due to flow across the vertebrobasilar confluence. Other: None.  IMPRESSION: 1. Multifocal acute ischemia within both hemispheres, left-greater-than-right, and multiple vascular territories. The largest area of abnormality is at the posterior left insula. The pattern is most suggestive of an embolic process, particularly in a patient with atrial fibrillation. 2. Occlusion of the left vertebral artery with distal reconstitution due to flow across the vertebrobasilar confluence. 3. Normal intracranial MRA Electronically Signed   By: Ulyses Jarred M.D.   On: 02/15/2021 23:44   MR CERVICAL SPINE WO CONTRAST  Result Date: 02/17/2021 CLINICAL DATA:  Progressive myelopathy EXAM: MRI CERVICAL SPINE WITHOUT CONTRAST TECHNIQUE: Multiplanar, multisequence MR imaging of the cervical spine was performed. No intravenous contrast was administered. COMPARISON:  None. FINDINGS: Alignment: Physiologic. Vertebrae: No fracture, evidence of discitis, or bone lesion. Cord: Normal signal and morphology. Posterior Fossa, vertebral arteries, paraspinal tissues: Abnormal left vertebral artery flow void. Disc levels: C1-C2: Normal. C2-C3: Mild left uncovertebral hypertrophy. Mild left foraminal stenosis. No spinal canal stenosis. C3-C4: Small disc bulge with bilateral uncovertebral hypertrophy. Mild left foraminal stenosis. C4-C5: Small disc bulge without stenosis. C5-C6: Small disc bulge with uncovertebral hypertrophy. Mild spinal canal and bilateral neural foraminal stenosis. C6-C7: Small disc bulge with left-greater-than-right uncovertebral hypertrophy. Mild left foraminal stenosis. C7-T1: Normal disc space and facets. No spinal canal or neuroforaminal stenosis. IMPRESSION: 1. Abnormal left vertebral artery flow void consistent with occlusion or high-grade stenosis. 2. Mild spinal canal and bilateral neural foraminal stenosis at C5-C6. 3. Mild left C2-3, left C3-4 and left C6-7 neural foraminal stenosis. Electronically Signed   By: Ulyses Jarred M.D.   On: 02/17/2021 01:17   CT HEAD CODE STROKE WO  CONTRAST  Result Date: 02/15/2021 CLINICAL DATA:  Code stroke. 60 year old male with acute neurologic deficit. Colorectal cancer. EXAM: CT HEAD WITHOUT CONTRAST TECHNIQUE: Contiguous axial images were obtained from the base of the skull through the vertex without intravenous contrast. COMPARISON:  Brain MRI 05/20/2020. FINDINGS: Brain: Broad-based area of course pachymeningeal calcification along the left superior hemisphere convexity is stable from the MRI last year although more conspicuous on CT. No acute intracranial hemorrhage identified. No acute intracranial mass effect. No ventriculomegaly. Widespread patchy white matter hypodensity, more pronounced in the right hemisphere. No cortically based acute infarct identified. No cortical encephalomalacia identified. Vascular: Calcified atherosclerosis at the skull base. No suspicious intracranial vascular hyperdensity. Skull: No acute or suspicious osseous lesion identified. Sinuses/Orbits: Visualized paranasal sinuses and mastoids are stable and well aerated. Other: Visualized orbits and scalp soft tissues are within normal limits. ASPECTS [facility] Stroke Program Early CT Score) Total score (0-10 with 10 being normal): 10 IMPRESSION: 1. No acute cortically based  infarct or acute intracranial hemorrhage identified. ASPECTS 10. 2. Coarse chronic dural calcification along the left hemisphere, perhaps sequelae of a remote subdural hematoma or infection. Chronic white matter disease. Electronically Signed   By: Genevie Ann M.D.   On: 02/15/2021 09:07    Labs:  Basic Metabolic Panel: Recent Labs  Lab 03/10/21 0619  NA 137  K 4.4  CL 101  CO2 28  GLUCOSE 84  BUN 13  CREATININE 1.30*  CALCIUM 9.1    CBC: Recent Labs  Lab 03/10/21 0619  WBC 8.1  HGB 15.2  HCT 45.1  MCV 97.8  PLT 172    CBG: No results for input(s): GLUCAP in the last 168 hours.  Family history.  Mother with cancer and hypertension.  Brother with bleeding disorder.  Denies any  esophageal cancer or colon cancer  Brief HPI:   Juan Wheeler is a 60 y.o. right-handed male with history of squamous cell carcinoma of the lung, rectal adenocarcinoma neuropathy cardiomyopathy with ejection fraction of 30 to 35% bilateral pulmonary emboli on Eliquis atrial fibrillation, CAD/PAD, hypertension, tobacco abuse, chronic back pain followed at [facility].  Per chart review lives with spouse independent prior to admission.  1 level home 3-4 steps to entry.  Presented 02/23/2021 with weakness dysarthria and left gaze preference.  CT/MRI as well as MRA showed multifocal acute ischemia within bilateral left greater than right multiple vascular territories.  The largest area of abnormality at the posterior left insula.  Pattern most suggestive of embolic process.  Occlusion of left vertebral artery with distal reconstitution due to flow across the vertebrobasilar confluence.  Normal intracranial MRA.  MRI cervical spine showed mild spinal canal stenosis and bilateral neuroforaminal stenosis C5-6.  Mild left C2-3, 3-4 and C6-7 neuroforaminal stenosis.  Admission chemistries unremarkable except urine drug screen positive marijuana.  Recent echocardiogram with ejection fraction of 30 to 35%, global hypokinesis.  Neurology follow-up maintained on low-dose aspirin as well as Pradaxa.  Tolerating a regular diet.  Due to patient's right side weakness dysarthria and cognitive deficits was admitted for a comprehensive rehab program.   Hospital Course: Juan Wheeler was admitted to rehab 02/21/2021 for inpatient therapies to consist of PT, ST and OT at least three hours five days a week. Past admission physiatrist, therapy team and rehab RN have worked together to provide customized collaborative inpatient rehab.  Pertain to patient's multifocal ischemia within the bilateral, left greater than right multiple vascular territories due to cardioembolic source noted dysarthria right side weakness left gaze  preference.  He continued to participate with therapies.  Maintained on Pradaxa as well as low-dose aspirin would follow-up with neurology services.  Patient with history of rectal adenocarcinoma as well as squamous cell lung cancer follow-up outpatient.  Chronic pain managed use of Neurontin 300 mg nightly scheduled OxyContin 10 mg every 12 hour as well as hydrocodone as needed and patient would follow-up at [facility] for management as prior to admission.  Zocor ongoing for hyperlipidemia.  Noted history of cardiomyopathy with history of bilateral pulmonary emboli as well as atrial fibrillation cardiac rate controlled patient remained on Pradaxa.  No chest pain or shortness of breath.  He did have a history of tobacco abuse noted positive marijuana on urine drug screen receiving counts regards to cessation of these products.   Blood pressures were monitored on TID basis and soft and monitored     Rehab course: During patient's stay in rehab weekly team conferences were  held to monitor patient's progress, set goals and discuss barriers to discharge. At admission, patient required minimal assist supine to sit max assist sit to supine max assist 3 feet to person hand-held assist max assist for grooming max assist upper body dressing and bathing mod assist toilet transfers  Physical exam.  Blood pressure 118/73 pulse 88 temperature 98.3 respirations 18 oxygen saturation 93% room air Constitutional.  No acute distress HEENT Head.  Normocephalic and atraumatic Eyes.  Pupils round and reactive to light no discharge without nystagmus Neck.  Supple nontender no JVD without thyromegaly Cardiac regular rate rhythm not extra sounds or murmur heard Abdomen.  Soft nontender positive bowel sounds without rebound Respiratory effort normal no respiratory distress without wheeze Skin.  No edema or tenderness extremities Neurologic.  Alert no acute distress makes eye contact with examiner noted left  gaze preference mild dysarthria but intelligible.  Provides name and age follows commands Motor.  Left upper left lower extremity 5/5 proximal distal Right upper extremity 1+-2 -/5 proximal to distal with apraxia Right lower extremity 0/5 proximal distal  He/She  has had improvement in activity tolerance, balance, postural control as well as ability to compensate for deficits. He/She has had improvement in functional use RUE/LUE  and RLE/LLE as well as improvement in awareness.  Supine to sit from flat bed without bed rails completed with moderate assist verbal cues.  Scooted to edge of bed contact-guard assist.  Minimal assist upper body bathing minimal assist lower body bathing supervision upper body dressing minimal assist lower body dressing.  Ambulates 45 feet right AFO brace with assistance.  Sit to supine with cues to use left lower extremity.  Full family teaching completed plan discharge to home       Disposition: Discharged home    Diet: Mechanical soft  Special Instructions: No driving smoking or alcohol  Medications at discharge 1.  Aspirin 81 mg p.o. daily 2.  Pradaxa 150 mg p.o. every 12 hours 3.  Neurontin 300 mg p.o. nightly 4.  Hydrocodone 10/325 mg 1 tablet every 4 hours as needed pain 5.  Toprol-XL 50 mg p.o. daily 6.  OxyContin 10 mg every 12 hours 7.  Protonix 40 mg p.o. nightly 8.  Zocor 40 mg p.o. daily  30-35 minutes were spent completing discharge summary and discharge planning  Discharge Instructions     Ambulatory referral to Neurology   Complete by: As directed    An appointment is requested in approximately 4 weeks multifocal ischemia with bilateral left greater than right vascular territory infarctions   Ambulatory referral to Physical Medicine Rehab   Complete by: As directed    Moderate complexity follow-up 1 to 2 weeks multifocal ischemic with bilateral left greater than right territory infarctions        Follow-up Information     Raulkar,  Clide Deutscher, MD Follow up.   Specialty: Physical Medicine and Rehabilitation Why: 05/02/21 please arrive at 1:40pm for 2:00pm appointment, thank you! Contact information: [address] [place] [address] [phone]                 Signed: Cathlyn Parsons 03/12/2021, 5:18 AM

## 2021-03-10 NOTE — Progress Notes (Signed)
Occupational Therapy Session Note  Patient Details  Name: Juan Wheeler MRN: [id number] Date of Birth: 16-Jan-1961  Today's Date: 03/10/2021 OT Individual Time: 1771-1657 OT Individual Time Calculation (min): 56 min    Short Term Goals: Week 2:  OT Short Term Goal 1 (Week 2): Pt will complete LB dressing min assist at sit > stand level OT Short Term Goal 2 (Week 2): Pt will complete UB dressing with min assist OT Short Term Goal 3 (Week 2): Pt will complete stand pivot transfers with min assist 3 of 4 attempts  Skilled Therapeutic Interventions/Progress Updates:    Treatment session with focus on self-care retraining and hands on family education with wife.  Pt received semi-reclined in bed agreeable to therapy session to simulate self-care tasks as pt reports already bathing and dressing this AM.  Discussed typical routine for bathing/dressing at home.  Pt completed bed mobility from flat surface with CGA to come to sitting EOB after educating on placement of RLE over LLE to allow for assistance to advance RLE to EOB.  Engaged in simulated bathing and dressing with focus on hemi-technique and active assistance of RUE during bathing.  Pt able to advance RUE to place it on L lower arm, therapist then provided support and facilitation at elbow to simulate washing LUE.  Therapist issued pt a wash mitt and educated pt and wife on use to increase independence with bathing.  Pt able to achieve and maintain figure 4 position to complete LB dressing, therapist providing CGA for sitting balance.  Completed sit > stand with Min A - CGA with pt demonstrating improved standing balance and weight shifting to simulate LB dressing and hygiene.  Pt's wife completed sit > stand x3 with pt with focus on improved body positioning and awareness to allow for weight shifting for sit > stand.  Therapist demonstrated squat and stand pivot transfer with pt, then pt and wife able to return demonstration with initial mod  assist fading to min assist with practice.  Pt able to direct his care with ability to verbalize to wife improved positioning appropriately.  Pt demonstrating improved grasp and AROM in RUE to utilize as stabilizer and to gross assist during self-care tasks.  Therapist provided pt with self-ROM HEP to continue to address RUE NMR.  Pt returned to semi-reclined and left with all needs in reach.  Therapy Documentation Precautions:  Precautions Precautions: Fall Precaution Comments: dense R hemi, dysarthria, colostomy Restrictions Weight Bearing Restrictions: No  Pain: Pain Assessment Pain Scale: Faces Faces Pain Scale: No hurt   Therapy/Group: Individual Therapy  Simonne Come 03/10/2021, 10:50 AM

## 2021-03-10 NOTE — Progress Notes (Signed)
Speech Language Pathology Weekly Progress and Session Note  Patient Details  Name: Juan Wheeler MRN: [id number] Date of Birth: 1961-04-29  Beginning of progress report period: March 03, 2021 End of progress report period: March 10, 2021  Today's Date: 03/10/2021 SLP Individual Time: 0830-0900 SLP Individual Time Calculation (min): 30 min  Short Term Goals: Week 2: SLP Short Term Goal 1 (Week 2): Pt will tolerate current diet and thin liquids with no overt s/sx of aspiration or penetration and use of strategies. SLP Short Term Goal 1 - Progress (Week 2): Met SLP Short Term Goal 2 (Week 2): Pt will demonstrate use of  compensatory strategies to increase speech intelligibility to 85% with min A verbal cues SLP Short Term Goal 2 - Progress (Week 2): Progressing toward goal SLP Short Term Goal 3 (Week 2): Pt will increase sustained attention to functional tasks for 10 minutes with min A verbal cues SLP Short Term Goal 3 - Progress (Week 2): Met SLP Short Term Goal 4 (Week 2): Pt will recall novel and functional information with 85% accuracy provided min A cues SLP Short Term Goal 4 - Progress (Week 2): Met SLP Short Term Goal 5 (Week 2): Pt will complete midly complex problem solving tasks with 85% accuracy provided mod A cues SLP Short Term Goal 5 - Progress (Week 2): Met    New Short Term Goals: Week 3: SLP Short Term Goal 1 (Week 3): STG=LTG due to ELOS  Weekly Progress Updates: Pt has met 4 out of 5 stg's this reporting period due to improvement in problem solving, sustained attention, recall of novel information following a delay and tolerance with diet. He is currently on a dysphagia 3 diet and tolerating well with improved po intake since diet upgrade. Pt continues to require min A for use of speech intelligibility strategies to increase clarity of speech. He will benefit from continued skilled SLP intervention to increase speech and cognition to reduce caregiver burden and increase  independence with functional tasks.      Intensity: Minumum of 1-2 x/day, 30 to 90 minutes Frequency: 3 to 5 out of 7 days Duration/Length of Stay: 14-17 days Treatment/Interventions: Cognitive remediation/compensation;Speech/Language facilitation;Therapeutic Activities;Functional tasks;Cueing hierarchy;Therapeutic Exercise;Internal/external aids;Dysphagia/aspiration precaution training;Medication managment;Patient/family education   Daily Session  Skilled Therapeutic Interventions: Skilled SLP intervention focused on cognition and discharge education. Pt completed visual memory task with supervision to min a verbal cues for use of repetition and assciations as a strategy to increase recall. He was able to recall 70% of details after a delay.Wife  and patient educated on using a BID pill organizer and a monthly calendar to keep track of medications and doctors/therapist appointments. Pt participated well In skilled slp intervention. Wife reports she noties pt still has some slurring in speech but is mostly back to baseline with speech. Educated pt on overarticulation and segmenting syllables. Cont with therapy per plan of care.     General    Pain Pain Assessment Pain Scale: Faces Pain Score: Asleep Faces Pain Scale: No hurt  Therapy/Group: Individual Therapy  [name] Rozelle Caudle 03/10/2021, 8:51 AM

## 2021-03-10 NOTE — Progress Notes (Signed)
PROGRESS NOTE   Subjective/Complaints: C/o of GERD nearly every morning, does report he ate a large meal this morning: discussed eating smaller more frequent meals, sitting up after eating, protonix PRN When asked, he does report continued rectal pain. Discussed long acting oxycodone and prn Norco which he is using much less frequently (daily)   ROS:  Pt denies SOB, abd pain, CP, N/V/C/D, and vision changes, +GERD     Objective:   No results found. Recent Labs    03/10/21 0619  WBC 8.1  HGB 15.2  HCT 45.1  PLT 172    Recent Labs    03/10/21 0619  NA 137  K 4.4  CL 101  CO2 28  GLUCOSE 84  BUN 13  CREATININE 1.30*  CALCIUM 9.1     Intake/Output Summary (Last 24 hours) at 03/10/2021 1037 Last data filed at 03/10/2021 0700 Gross per 24 hour  Intake 720 ml  Output 1075 ml  Net -355 ml        Physical Exam: Vital Signs Blood pressure 115/79, pulse 65, temperature 98.3 F (36.8 C), resp. rate 20, weight 63.8 kg, SpO2 91 %. Gen: no distress, normal appearing HEENT: oral mucosa pink and moist, NCAT Cardio: Reg rate Chest: normal effort, normal rate of breathing Abd: soft, non-distended  Ext: no clubbing, cyanosis, or edema Psych: pleasant and cooperative, quiet Skin: in his sacram/perinal there a blue, rubber retention-like material with associated slight serosanguinous drainage on gauze/foam dressing Neuro: alert, dysarthric. Left gaze preference.  LUE and LLE 5/5. RUE 1-2/5, RLE trace hip/knee extensor synergy  0/5 distally Musculoskeletal: no trunk or extremity pain     Assessment/Plan: 1. Functional deficits which require 3+ hours per day of interdisciplinary therapy in a comprehensive inpatient rehab setting. Physiatrist is providing close team supervision and 24 hour management of active medical problems listed below. Physiatrist and rehab team continue to assess barriers to discharge/monitor  patient progress toward functional and medical goals  Care Tool:  Bathing    Body parts bathed by patient: Right arm, Chest, Abdomen, Face   Body parts bathed by helper: Left arm, Front perineal area, Buttocks     Bathing assist Assist Level: Moderate Assistance - Patient 50 - 74%     Upper Body Dressing/Undressing Upper body dressing   What is the patient wearing?: Pull over shirt    Upper body assist Assist Level: Supervision/Verbal cueing    Lower Body Dressing/Undressing Lower body dressing      What is the patient wearing?: Incontinence brief, Pants     Lower body assist Assist for lower body dressing: Minimal Assistance - Patient > 75%     Toileting Toileting    Toileting assist Assist for toileting: Moderate Assistance - Patient 50 - 74% Assistive Device Comment: ueinal   Transfers Chair/bed transfer  Transfers assist     Chair/bed transfer assist level: Minimal Assistance - Patient > 75%     Locomotion Ambulation   Ambulation assist   Ambulation activity did not occur: Safety/medical concerns  Assist level: 2 helpers Assistive device: Other (comment) (L handrail) Max distance: 8ft   Walk 10 feet activity   Assist  Walk 10 feet  activity did not occur: Safety/medical concerns  Assist level: 2 helpers Assistive device: Other (comment) (L handrail)   Walk 50 feet activity   Assist Walk 50 feet with 2 turns activity did not occur: Safety/medical concerns         Walk 150 feet activity   Assist Walk 150 feet activity did not occur: Safety/medical concerns         Walk 10 feet on uneven surface  activity   Assist Walk 10 feet on uneven surfaces activity did not occur: Safety/medical concerns         Wheelchair     Assist Will patient use wheelchair at discharge?: Yes Type of Wheelchair: Manual    Wheelchair assist level: Supervision/Verbal cueing Max wheelchair distance: 150    Wheelchair 50 feet with 2 turns  activity    Assist        Assist Level: Supervision/Verbal cueing   Wheelchair 150 feet activity     Assist      Assist Level: Supervision/Verbal cueing   Blood pressure 115/79, pulse 65, temperature 98.3 F (36.8 C), resp. rate 20, weight 63.8 kg, SpO2 91 %.  Medical Problem List and Plan: 1.  Dysarthria/right hemiparesis with left gaze preference secondary to multifocal acute ischemia within the bilateral, left greater than right multiple vascular territories due to cardioembolic source             -patient may shower           Continue PT, OT and SLP   D/C date 6/15 2.  Atrial fibrillation: continue pradaxa             -antiplatelet therapy: Aspirin 81 mg daily 3. Rectal pain 2/2 adenocarcinoma: Continue Hydrocodone as needed. D/c tylenol given allergy (palpitations). Added Gabapentin 300mg  HS. ER Oxycontin q12H 10mg  started 6/6.   6/13: continue current regimen, take PNR norco if needed.   -needs to keep turning in bed as well 4. Mood: Provide emotional support             -antipsychotic agents: N/A 5. Neuropsych: This patient is capable of making decisions on his own behalf. 6. Skin/Wound Care:  Pt with hx of rectal cancer with radiation. Has had chronic wound. Has exposed retention material with slight s/s drainage.   -appreciate WOC RN follow up  -continue local care, keep area clean. Triple paste per WOC RN. Continue foam dressings and change PRN for soilage. 7. Hyponatremia: monitor CMP weekly.  8.  Hypertension.  Continue Toprol-XL 50 mg daily.  BP soft- continue to monitor.  Vitals:   03/09/21 1930 03/10/21 0450  BP: 121/68 115/79  Pulse: 64 65  Resp: 20 20  Temp: 97.8 F (36.6 C) 98.3 F (36.8 C)  SpO2: 91% 91%   6/13- BP soft- but no orthostasis- con't regimen 9.  Hyperlipidemia: Zocor 10.  History of squamous cell carcinoma of the lung Follow-up outpatient 11.  Cardiomyopathy with history of bilateral pulmonary emboli.  Continue Pradaxa 12.   Atrial fibrillation.                HR controlled  6/11- rate controlled- con't regimen 13.  History of tobacco abuse.  Counseling 14.  GERD: Protonix drop with meds as below 15.  Thrombocytopenia   Resolved  16.  ELevated Hgb- may have some hemoconcentration with elevated BUN/Creat enc po fluid, baseline 15-16, repeat labs Monday 17.  Hx rectal adenoCA with colostomy from 2017- will have issues managing this due to hemiplegia (see  above also) 18. AKI: encourage hydration. Trending downward 6/6, check labs Monday 19.  Poor appetite: eating much better after upgraded to D3 20. GERD: advised regarding eating smaller more frequent meals, sitting up after eating, added protonix 20mg  PRN daily for GERD 21. Disposition: d/c 6/15. MD f/u scheduled   LOS: 17 days A FACE TO [facility] [place] 03/10/2021, 10:37 AM

## 2021-03-10 NOTE — Progress Notes (Signed)
Physical Therapy Weekly Progress Note  Patient Details  Name: Juan Wheeler MRN: [id number] Date of Birth: [birth year]  Beginning of progress report period: Feb 22, 2021 End of progress report period: March 10, 2021  Today's Date: 03/10/2021 PT Individual Time: 1100-1143 PT Individual Time Calculation (min): 43 min   Today's Date: 03/10/2021 PT Missed Time: 17 Minutes Missed Time Reason: Patient fatigue  Patient has met 2 of 3 short term goals. Pt demonstrates gradual progress towards long term goals. Pt is currently able to perform bed mobility with supervision, stand<>pivot transfers without AD and CGA/min A, ambulate up to 6f with L handrail and mod A with assist to prevent R knee buckling/hyperextension, and perform WC mobility up to 1581fusing R hemi technique. Pt continues to be limited by fatigue, sacral pain, R hemiparesis, decreased balance/postural control, impaired motor planning/sequencing, and decreased activity tolerance. Pt's wife attended family education training on 6/13 and verbalized and demonstrated confidence with all tasks to ensure safe discharge home.   Patient continues to demonstrate the following deficits muscle weakness, decreased cardiorespiratoy endurance, impaired timing and sequencing, unbalanced muscle activation, decreased coordination, and decreased motor planning, and decreased standing balance, decreased postural control, hemiplegia, and decreased balance strategies and therefore will continue to benefit from skilled PT intervention to increase functional independence with mobility.  Patient progressing toward long term goals..  Continue plan of care.  PT Short Term Goals Week 2:  PT Short Term Goal 1 (Week 2): Pt will ambulate 1550fith max A of 1 PT Short Term Goal 1 - Progress (Week 2): Met PT Short Term Goal 2 (Week 2): Pt will initiate stair navigation PT Short Term Goal 2 - Progress (Week 2): Progressing toward goal PT Short Term Goal 3 (Week 2):  Pt initate understanding of managing WC parts PT Short Term Goal 3 - Progress (Week 2): Met Week 3:  PT Short Term Goal 1 (Week 3): STG=LTG due to LOS  Skilled Therapeutic Interventions/Progress Updates:  Ambulation/gait training;Community reintegration;DME/adaptive equipment instruction;Neuromuscular re-education;Psychosocial support;Stair training;UE/LE Strength taining/ROM;Wheelchair propulsion/positioning;UE/LE Coordination activities;Therapeutic Activities;Skin care/wound management;Pain management;Functional electrical stimulation;Discharge planning;Balance/vestibular training;Disease management/prevention;Cognitive remediation/compensation;Functional mobility training;Patient/family education;Splinting/orthotics;Therapeutic Exercise;Visual/perceptual remediation/compensation   Today's Interventions: Received pt supine in bed, reporting fatigue, with wife present for family education training. Session with emphasis on discharge planning, functional mobility/transfers, generalized strengthening, dynamic standing balance/coordination, simulated car transfers, and improved activity tolerance. Pt transferred supine<>sitting EOB with supervision and use of bedrails and donned shoes sitting EOB with max A from pt's wife. Stand<>pivot bed<>WC with min A provided by pt's wife. Educated pt's wife on importance of using gait belt with special consideration for ostomy bag. Also educated pt's wife on [facility] (able to perform with verbal instruction from pt by end of session). Pt transported to [facility] ortho gym [facility] for time management purposes and performed simulated car transfer with min A with education for placement of WC next to car and body mechanics/postioning with transfer. Educated pt/pt's wife of importance of avoiding ambulating at home and with PT only due to safety concerns; both in agreement. Discussed getting in home as pt has 4 steps. Recommended again getting ramp installed (CSW to  provide pt/pt's wife with information) with backup plan to be transported home in ambulance and avoiding leaving home until ramp is installed; both in agreement. Suggested having pt's wife observe pt ambulate with this therapist however pt politely declined stating "If I'm not walking at home then there's no sense in having her watch".  Pt performed WC mobility 57f x 1 and 243fx 1 using R hemi technique and supervision [facility] transported remainder of way due to fatigue. Pt requested to return to bed and transferred back to bed with assist provided by wife. Sit<>supine with supervision. Of note, when bridging to adjust chuck pad pt with increased R hamstring activation and able to actively flex knee to 50 degrees prior to using LUE to assist remainder of range. Concluded session with pt supine in bed, needs within reach, and bed alarm on. 17 minutes missed of skilled physical therapy due to fatigue.   Therapy Documentation Precautions:  Precautions Precautions: Fall Precaution Comments: dense R hemi, dysarthria, colostomy Restrictions Weight Bearing Restrictions: No  Therapy/Group: Individual Therapy AnAlfonse AlpersT, DPT   03/10/2021, 7:31 AM

## 2021-03-11 MED ORDER — DABIGATRAN ETEXILATE MESYLATE 150 MG PO CAPS: 150.0000 mg | ORAL_CAPSULE | Freq: Two times a day (BID) | ORAL | 0 refills | Status: AC

## 2021-03-11 MED ORDER — SIMVASTATIN 40 MG PO TABS: 40.0000 mg | ORAL_TABLET | Freq: Every day | ORAL | 0 refills | Status: AC

## 2021-03-11 MED ORDER — OMEPRAZOLE 40 MG PO CPDR: 40.0000 mg | DELAYED_RELEASE_CAPSULE | Freq: Every day | ORAL | 0 refills | Status: AC

## 2021-03-11 MED ORDER — HYDROCODONE-ACETAMINOPHEN 10-325 MG PO TABS: 1.0000 | ORAL_TABLET | ORAL | 0 refills | Status: AC | PRN

## 2021-03-11 MED ORDER — OXYCODONE HCL ER 10 MG PO T12A: 10.0000 mg | EXTENDED_RELEASE_TABLET | Freq: Two times a day (BID) | ORAL | 0 refills | Status: AC

## 2021-03-11 MED ORDER — NITROGLYCERIN 0.4 MG SL SUBL: 0.4000 mg | SUBLINGUAL_TABLET | SUBLINGUAL | 0 refills | Status: AC | PRN

## 2021-03-11 MED ORDER — METOPROLOL SUCCINATE ER 50 MG PO TB24: 50.0000 mg | ORAL_TABLET | Freq: Every day | ORAL | 0 refills | Status: AC

## 2021-03-11 MED ORDER — GABAPENTIN 600 MG PO TABS: 300.0000 mg | ORAL_TABLET | Freq: Every day | ORAL | 0 refills | Status: AC

## 2021-03-11 NOTE — Progress Notes (Signed)
Occupational Therapy Discharge Summary  Patient Details  Name: Juan Wheeler MRN: [id number] Date of Birth: 10-19-1960   Patient has met 10 of 11 long term goals due to improved activity tolerance, improved balance, postural control, ability to compensate for deficits, functional use of  RIGHT upper and RIGHT lower extremity, improved attention, and improved awareness.  Patient to discharge at [facility] Assist level.  Patient's care partner is independent to provide the necessary physical and cognitive assistance at discharge.    Reasons goals not met: Pt continues to require Supervision/setup for eating as pt requires assist to open containers.  Recommendation:  Patient will benefit from ongoing skilled OT services in home health setting to continue to advance functional skills in the area of BADL and Reduce care partner burden.  Equipment: 3 in 1 BSC  Reasons for discharge: treatment goals met and discharge from hospital  Patient/family agrees with progress made and goals achieved: Yes  OT Discharge Precautions/Restrictions  Precautions Precautions: Fall Precaution Comments: R hemi, dysarthria, colostomy General   Vital Signs Therapy Vitals Temp: 97.9 F (36.6 C) Pulse Rate: 73 Resp: 18 BP: 99/69 Oxygen Therapy SpO2: 96 % Pain  Pt with c/o pain in buttocks. Repositioned ADL ADL Eating: Set up Where Assessed-Eating: Edge of bed Grooming: Setup Where Assessed-Grooming: Sitting at sink Upper Body Bathing: Setup Where Assessed-Upper Body Bathing: Edge of bed Lower Body Bathing: Contact guard Where Assessed-Lower Body Bathing: Edge of bed Upper Body Dressing: Supervision/safety Where Assessed-Upper Body Dressing: Edge of bed Lower Body Dressing: Minimal assistance Where Assessed-Lower Body Dressing: Edge of bed Toileting: Modified independent Toilet Transfer: Minimal assistance Armed forces technical officer Method: Arts development officer: Research officer, political party: Not assessed Social research officer, government: Not assessed Vision Baseline Vision/History: No visual deficits Patient Visual Report: No change from baseline Vision Assessment?: Yes Ocular Range of Motion: Restricted on the right Tracking/Visual Pursuits: Decreased smoothness of vertical tracking;Decreased smoothness of horizontal tracking Saccades: Additional head turns occurred during testing;Decreased speed of saccadic movement  Cognition Overall Cognitive Status: Impaired/Different from baseline Arousal/Alertness: Awake/alert Attention: Sustained Sustained Attention: Appears intact Memory: Impaired Awareness: Appears intact Problem Solving: Appears intact Safety/Judgment: Appears intact Sensation Sensation Light Touch: Impaired Detail Central sensation comments: absent sensation in RUE Light Touch Impaired Details: Absent RUE Hot/Cold: Not tested Proprioception: Appears Intact Coordination Gross Motor Movements are Fluid and Coordinated: No Fine Motor Movements are Fluid and Coordinated: No Coordination and Movement Description: grossly uncoordinated due to R hemi, decreased balance/postural control, decreaed motor planning/sequencing, fatigue, sacral pain, and generalized weakness Finger Nose Finger Test: WFL on LUE, only able to flex R elbow to 100 degrees Heel Shin Test: WFL on LLE, unable to perform on RLE Motor  Motor Motor: Hemiplegia;Abnormal tone;Abnormal postural alignment and control Motor - Skilled Clinical Observations: grossly uncoordinated due to R hemi, decreaesd balance/postural control, decreaed motor planning/sequencing, fatigue, sacral pain, and generalized weakness Mobility  Bed Mobility Bed Mobility: Rolling Right;Rolling Left;Sit to Supine;Supine to Sit Rolling Right: Supervision/verbal cueing Rolling Left: Supervision/Verbal cueing Supine to Sit: Supervision/Verbal cueing Sit to Supine: Supervision/Verbal cueing Transfers Sit  to Stand: Minimal Assistance - Patient > 75% Stand to Sit: Minimal Assistance - Patient > 75%  Trunk/Postural Assessment  Cervical Assessment Cervical Assessment: Exceptions to [facility] (forward head) Thoracic Assessment Thoracic Assessment: Exceptions to [facility] (kyphosis) Lumbar Assessment Lumbar Assessment: Exceptions to [facility] (posterior pelvic tilt) Postural Control Postural Control: Deficits on evaluation  Balance Balance Balance Assessed: Yes Static Sitting Balance Static Sitting - Balance Support:  Feet supported;Left upper extremity supported Static Sitting - Level of Assistance: 5: Stand by assistance (supervision) Dynamic Sitting Balance Dynamic Sitting - Balance Support: Feet supported;No upper extremity supported Dynamic Sitting - Level of Assistance: 5: Stand by assistance (supervision) Static Standing Balance Static Standing - Balance Support: Left upper extremity supported Static Standing - Level of Assistance: 5: Stand by assistance (CGA) Dynamic Standing Balance Dynamic Standing - Balance Support: Left upper extremity supported Dynamic Standing - Level of Assistance: 3: Mod assist Extremity/Trunk Assessment RUE Assessment RUE Assessment: Exceptions to [facility] Passive Range of Motion (PROM) Comments: shoulder flexion to 150* then reports pain, elbow and wrist WNL Active Range of Motion (AROM) Comments: shoulder abduction 20*, elbow flexion 10-100*, wrist extension to neutral General Strength Comments: 1/5 RUE Body System: Neuro Brunstrum levels for arm and hand: Arm;Hand Brunstrum level for arm: Stage II Synergy is developing Brunstrum level for hand: Stage II Synergy is developing LUE Assessment LUE Assessment: Within Functional Limits   Juan Wheeler 03/11/2021, 3:29 PM

## 2021-03-11 NOTE — Progress Notes (Signed)
Occupational Therapy Session Note  Patient Details  Name: Juan Wheeler MRN: [id number] Date of Birth: 1960-11-09  Today's Date: 03/11/2021 OT Individual Time: 8502-7741 OT Individual Time Calculation (min): 26 min    Short Term Goals: Week 2:  OT Short Term Goal 1 (Week 2): Pt will complete LB dressing min assist at sit > stand level OT Short Term Goal 2 (Week 2): Pt will complete UB dressing with min assist OT Short Term Goal 3 (Week 2): Pt will complete stand pivot transfers with min assist 3 of 4 attempts  Skilled Therapeutic Interventions/Progress Updates:    Treatment session with focus on RUE NMR and self-ROM.  Pt received semi-reclined in bed agreeable to treatment session. Pt completed bed mobility supervision with use of bed rails.  Engaged in [place] in sitting at EOB with towel glides.  Therapist provided pt with visual targets during towel glides to facilitate movement in all directions.  Pt demonstrating increased AAROM, therapist with assist/support at R elbow.  Pt able to open and close hand for loose gross grasp on bottle to allow pt to open container.  Educated on self-ROM exercises with pt able to complete 1 set of 10 shoulder flexion/extension, abduction/adduction, and internal/external rotation.  Pt also demonstrating increased elbow flexion/extension and wrist flexion to neutral.  Encouraged pt to continue to engage in self-ROM exercises and attempt to utilize RUE as stabilizer to gross assist to continue with NMR.  Pt returned to semi-sidelying position with supervision and left with all needs in reach.  Therapy Documentation Precautions:  Precautions Precautions: Fall Precaution Comments: R hemi, dysarthria, colostomy Restrictions Weight Bearing Restrictions: No  Pain:  Pt with c/o pain in buttocks, not rated.  Repositioned in semi-sidelying at end of session    Therapy/Group: Individual Therapy  Simonne Come 03/11/2021, 11:46 AM

## 2021-03-11 NOTE — Progress Notes (Addendum)
Patient ID: Juan Wheeler, male   DOB: [birth year], 60 y.o.   MRN: [id number] Wife was here yesterday to go through family education. Did hands on care and will need ramp for home to get pt up the stairs. For discharge tomorrow will need ambulance home. Aware of team conference and goals of min assist level.   3:20 PM Wife will be here in the am to pick up pt's wheelchair to take home and wants ambulance scheduled for 12:00

## 2021-03-11 NOTE — Progress Notes (Signed)
Physical Therapy Discharge Summary  Patient Details  Name: Juan Wheeler MRN: [id number] Date of Birth: [birth year]  Today's Date: 03/11/2021 PT Individual Time: 9379-0240 PT Individual Time Calculation (min): 54 min   Patient has met 8 of 11 long term goals due to improved activity tolerance, improved balance, improved postural control, increased strength, ability to compensate for deficits, functional use of  right lower extremity, improved attention, improved awareness, and improved coordination.  Patient to discharge at a wheelchair level  min A .  Patient's care partner is independent to provide the necessary physical assistance at discharge. Pt's wife attended family education training on 6/13 and verbalized and demonstrated confidence with all transfers to ensure safe discharge home. Educated pt/pt's wife on avoiding ambulating at home (and with PT only) due to fall risks and safety concerns associated with R hemiparesis and R knee inability; both verbalized understanding and in agreement.   Reasons goals not met: Pt did not meet ambulation goal of min A as pt currently requires mod A and not meet stair goal as pt is currently unsafe to navigate stairs due to R hemiparesis and R knee instability.   Recommendation:  Patient will benefit from ongoing skilled PT services in home health setting to continue to advance safe functional mobility, address ongoing impairments in transfers, generalized strengthening, dynamic standing balance/coordination, gait training, NMR, endurance, and to minimize fall risk.  Equipment: 18x18 manual WC ; recommendation for ramp to enter home   Reasons for discharge: treatment goals met and discharge from hospital  Patient/family agrees with progress made and goals achieved: Yes  Today's Interventions: Received pt supine in bed asleep, upon wakening pt reported having a "rough night" and not getting much sleep. However, pt agreeable to PT treatment and did not  c/o pain during session. Session with emphasis on discharge planning, functional mobility/transfers, generalized strengthening, dynamic standing balance/coordination, simulated car transfers, and improved activity tolerance. Pt performed bed mobility with supervision using bedrails x 2 trials throughout session. Donned/doffed shoes sitting EOB with max A and performed x 2 stand<>pivot transfers to/from bed with min A to R and CGA to L throughout session. Pt transported to 103M ortho gym in [facility] total A for time management purposes. Pt performed simulated car transfer with min A with cues to scoot to edge of chair and to ensure proper RLE foot placement prior to transferring. Pt transported to dayroom in [facility] total A and performed seated BLE strengthening on Kinetron at 30 cm/sec for 1 minute x 4 trials with LUE support with emphasis on R glute/quad activation and core strengthening. Pt then performed seated active assisted towel slides 2x10. Pt able to extend knee but required manual assist for knee flexion but with traces of hamstring activation. Pt transported back to room in [facility] total A. Concluded session with pt supine in bed, needs within reach, and bed alarm on.   PT Discharge Precautions/Restrictions Precautions Precautions: Fall Precaution Comments: R hemi, dysarthria, colostomy Restrictions Weight Bearing Restrictions: No Cognition Overall Cognitive Status: Impaired/Different from baseline Arousal/Alertness: Awake/alert Orientation Level: Oriented X4 Memory: Impaired Awareness: Appears intact Problem Solving: Appears intact Safety/Judgment: Appears intact Sensation Sensation Light Touch: Appears Intact Proprioception: Appears Intact Coordination Gross Motor Movements are Fluid and Coordinated: No Fine Motor Movements are Fluid and Coordinated: No Coordination and Movement Description: grossly uncoordinated due to R hemi, decreased balance/postural control, decreaed motor  planning/sequencing, fatigue, sacral pain, and generalized weakness Finger Nose Finger Test: WFL on LUE, only able to flex R elbow  to 90 degrees Heel Shin Test: WFL on LLE, unable to perform on RLE Motor  Motor Motor: Hemiplegia;Abnormal tone;Abnormal postural alignment and control Motor - Skilled Clinical Observations: grossly uncoordinated due to R hemi, decreaesd balance/postural control, decreaed motor planning/sequencing, fatigue, sacral pain, and generalized weakness  Mobility Bed Mobility Bed Mobility: Rolling Right;Rolling Left;Sit to Supine;Supine to Sit Rolling Right: Supervision/verbal cueing Rolling Left: Supervision/Verbal cueing Supine to Sit: Supervision/Verbal cueing Sit to Supine: Supervision/Verbal cueing Transfers Transfers: Sit to Stand;Stand to Sit;Stand Pivot Transfers Sit to Stand: Minimal Assistance - Patient > 75% Stand to Sit: Minimal Assistance - Patient > 75% Stand Pivot Transfers: Minimal Assistance - Patient > 75% Stand Pivot Transfer Details: Verbal cues for precautions/safety;Verbal cues for technique Stand Pivot Transfer Details (indicate cue type and reason): verbal cues for scooting to edge of chair, hand placement, and RLE placement Transfer (Assistive device): None Locomotion  Gait Ambulation: Yes Gait Assistance: Moderate Assistance - Patient 50-74% Gait Distance (Feet): 50 Feet Assistive device: Other (Comment) (L handrail) Gait Assistance Details: Manual facilitation for placement;Verbal cues for technique;Verbal cues for gait pattern;Verbal cues for sequencing;Tactile cues for placement Gait Assistance Details: verbal cues for weight shifting and upright posture, manual faciliation to advace RLE and to prevent hyperextension/buckling Gait Gait: Yes Gait Pattern: Impaired Gait Pattern: Poor foot clearance - right;Lateral hip instability;Right flexed knee in stance;Right foot flat;Decreased stance time - right;Decreased dorsiflexion -  right;Decreased stride length;Decreased weight shift to right;Decreased step length - right;Decreased step length - left;Decreased hip/knee flexion - right;Trunk flexed Gait velocity: decreased Wheelchair Mobility Wheelchair Mobility: Yes Wheelchair Assistance: Supervision/Verbal cueing Wheelchair Propulsion: Left upper extremity;Left lower extremity Wheelchair Parts Management: Needs assistance Distance: 124f  Trunk/Postural Assessment  Cervical Assessment Cervical Assessment: Exceptions to [facility](forward head) Thoracic Assessment Thoracic Assessment: Exceptions to [facility](kyphosis) Lumbar Assessment Lumbar Assessment: Exceptions to [facility](posterior pelvic tilt) Postural Control Postural Control: Deficits on evaluation  Balance Balance Balance Assessed: Yes Static Sitting Balance Static Sitting - Balance Support: Feet supported;Left upper extremity supported Static Sitting - Level of Assistance: 5: Stand by assistance (supervision) Dynamic Sitting Balance Dynamic Sitting - Balance Support: Feet supported;No upper extremity supported Dynamic Sitting - Level of Assistance: 5: Stand by assistance (supervision) Static Standing Balance Static Standing - Balance Support: Left upper extremity supported Static Standing - Level of Assistance: 5: Stand by assistance (CGA) Dynamic Standing Balance Dynamic Standing - Balance Support: Left upper extremity supported Dynamic Standing - Level of Assistance: 3: Mod assist Dynamic Standing - Comments: with gait Extremity Assessment  RLE Assessment RLE Assessment: Exceptions to [facility] CenterR[facility] Strength Right Hip Flexion: 2-/5 Right Hip ABduction: 2-/5 Right Hip ADduction: 2-/5 Right Knee Flexion: 0/5 Right Knee Extension: 3-/5 Right Ankle Dorsiflexion: 0/5 Right Ankle Plantar Flexion: 2+/5 LLE Assessment LLE Assessment: Exceptions to [facility] Strength Comments: grossly generalized to 4Audubon ParkPT, DPT  03/11/2021, 7:43  AM

## 2021-03-11 NOTE — Patient Care Conference (Signed)
Inpatient RehabilitationTeam Conference and Plan of Care Update Date: 03/11/2021   Time: 1:52 PM    Patient Name: Juan Wheeler Record Number: [id number]  Date of Birth: 10-02-1960 Sex: Male         Room/Bed: 5C08C/5C08C-01 Payor Info: Payor: [employer] MEDICARE / Plan: [employer] MEDICARE CHOICE PPO / Product Type: *No Product type* /    Admit Date/Time:  02/21/2021  2:00 PM  Primary Diagnosis:  Acute cerebral infarction [facility])  Hospital Problems: Principal Problem:   Acute cerebral infarction [facility]) Active Problems:   Ischemic cerebrovascular accident (CVA) of frontal lobe [facility])    Expected Discharge Date: Expected Discharge Date: 03/12/21  Team Members Present: Physician leading conference: Dr. Leeroy Cha Care Coodinator Present: Dorthula Nettles, RN, BSN, CRRN;Becky Dupree, LCSW Nurse Present: Dorthula Nettles, RN PT Present: Becky Sax, PT OT Present: Simonne Come, OT SLP Present: Charolett Bumpers, SLP PPS Coordinator present : Ileana Ladd, PT     Current Status/Progress Goal Weekly Team Focus  Bowel/Bladder   Pt cont of Bladder. Colostomy in place.  Pt remain cont  toilet pt as needed   Swallow/Nutrition/ Hydration             ADL's   Min A bathing and dressing, S UB dressing, Min A sit > stand, dynamic standing, and transfers  Min A overall, Supervision grooming and UB dressing  ADL retraining, dynamic standing balance, transfers, RUE NMR, d/c planning   Mobility   bed mobility supervision/min A, stand<>pivot transfers CGA/min A, gait 47f with L handrail mod A, and WC mobility 1588fsupervision  min/mod A; supervision WC mobility  bed mobility, functional mobility/transfers, generalized strengthening, dynamic sitting/standing balance/coordination, NMR, gait, family education, and improved endurance to activity.   Communication             Safety/Cognition/ Behavioral Observations            Pain   pt complains of pain in back periodically  Pt pain < 3   Assess for pain qshift and provide PRN medication as needed   Skin   Pt has a chronic sacral wound  Skin remain clean and intact with no additional skin breakdown  assess skin qshift and provide skin care     Discharge Planning:  Wife was in yesterday for family education needs a ramp at home for steps into home. Will need to go home by ambulance to get in tomorrow.   Team Discussion: Has OxyContin for pain, has decreased appetite. Continent bladder, has colostomy and continued education. Pain is controlled, has fissure to the bottom with a foam dressing. Going home by ambulance tomorrow. Patient on target to meet rehab goals: yes, supervision for bed mobility, min assist for stand pivot transfers. Instructed to do no walking at home, only to use the W/C. RUE has more return, able to grip more. With upper body bathing able to hold container. Supervision upper body dressing, min assist STS. Met goals for SLP.   *See Care Plan and progress notes for long and short-term goals.   Revisions to Treatment Plan:  Patient ready for discharge.  Teaching Needs: Patient education complete and ready for discharge.  Current Barriers to Discharge: Decreased caregiver support, Medical stability, Home enviroment access/layout, Wound care, Lack of/limited family support, Weight, Weight bearing restrictions, Medication compliance, and Behavior  Possible Resolutions to Barriers: Continue current medications, provide emotional support.     Medical Summary Current Status: pain secondary to rectal adenocarcinoma, dysphagia, severe  rectal wound, insomnia, poor appetite  Barriers to Discharge: Medical stability;Wound care  Barriers to Discharge Comments: pain secondary to rectal adenocarcinoma, dysphagia, severe rectal wound, poor appetite Possible Resolutions to [employer] Focus: continue long acting oxycontin, prn Norco, diet upgraded to D3, continue daily wound care, continue dietary  counseling   Continued Need for Acute Rehabilitation Level of Care: The patient requires daily medical management by a physician with specialized training in physical medicine and rehabilitation for the following reasons: Direction of a multidisciplinary physical rehabilitation program to maximize functional independence : Yes Medical management of patient stability for increased activity during participation in an intensive rehabilitation regime.: Yes Analysis of laboratory values and/or radiology reports with any subsequent need for medication adjustment and/or medical intervention. : Yes   I attest that I was present, lead the team conference, and concur with the assessment and plan of the team.   Cristi Loron 03/11/2021, 2:29 PM

## 2021-03-11 NOTE — Progress Notes (Signed)
Occupational Therapy Session Note  Patient Details  Name: Juan Wheeler MRN: [id number] Date of Birth: 1960/10/24  Today's Date: 03/11/2021 OT Individual Time: 1000-1040 OT Individual Time Calculation (min): 40 min    Short Term Goals: Week 2:  OT Short Term Goal 1 (Week 2): Pt will complete LB dressing min assist at sit > stand level OT Short Term Goal 2 (Week 2): Pt will complete UB dressing with min assist OT Short Term Goal 3 (Week 2): Pt will complete stand pivot transfers with min assist 3 of 4 attempts  Skilled Therapeutic Interventions/Progress Updates:    Patient in bed, alert and ready for therapy session.  He requests to complete sponge bath and get dressed.  He denies pain at this time.  Nursing completed ostomy care - patient able to direct care approx 50%.  Supine to/from sitting with CS.  He is able to sit unsupported with CS.  UB bathing and dressing completed with set up, oral care set up.  LB bathing and dressing completed bed level at his request - set up for washing and min A over right foot to donn brief and pants.  Completed right UE AAROM and stretching of tight shoulder flexors with good tolerance.  He remained in bed at close of session, bed alarm set and call bell in reach.    Therapy Documentation Precautions:  Precautions Precautions: Fall Precaution Comments: dense R hemi, dysarthria, colostomy Restrictions Weight Bearing Restrictions: No   Therapy/Group: Individual Therapy  Carlos Levering 03/11/2021, 7:35 AM

## 2021-03-11 NOTE — Progress Notes (Signed)
Speech Language Pathology Discharge Summary  Patient Details  Name: Juan Wheeler MRN: [id number] Date of Birth: 07-18-1961  Today's Date: 03/11/2021 SLP Individual Time: 0730-0810 SLP Individual Time Calculation (min): 40 min   Skilled Therapeutic Interventions: Skilled SLP intervention focused on discharge education. Pt able to recall stategies for safe swallow and speech intelligiblilty strategies. He was given signs in room to have once home as visual reminders. Pt completed speech intelligiblity task with supervision A at phrase level in less structured task.     Patient has met 5 of 5 long term goals.  Patient to discharge at overall Supervision level.  Reasons goals not met:     Clinical Impression/Discharge Summary:   Pt has met 5 out of 5 goals this admission. He currently requires supervision A for medication management, complex problem solving and use of speech intelligibility strategies in conversation. He demonstrates good awareness of physical limitations and anticipatory awareness. Discharge education has been completed with patient and wife. Pt will discharge home with 24 hour supervision from wife.    Care Partner:  Caregiver Able to Provide Assistance: Yes  Type of Caregiver Assistance: Physical;Cognitive  Recommendation:  Home Health SLP  Rationale for SLP Follow Up: Maximize cognitive function and independence;Maximize swallowing safety   Equipment: no equipment recommended at this time   Reasons for discharge: Discharged from hospital   Patient/Family Agrees with Progress Made and Goals Achieved: Yes    [name] Noelia Lenart 03/11/2021, 7:57 AM

## 2021-03-11 NOTE — Progress Notes (Signed)
Physical Therapy Session Note  Patient Details  Name: Juan Wheeler MRN: [id number] Date of Birth: [birth year]  Today's Date: 03/11/2021 PT Individual Time: 1595-3967 PT Individual Time Calculation (min): 45 min   Short Term Goals: Week 1:  PT Short Term Goal 1 (Week 1): Pt will perform bed mobility with mod assist PT Short Term Goal 1 - Progress (Week 1): Met PT Short Term Goal 2 (Week 1): Pt will transfer to [facility] with mod assist PT Short Term Goal 2 - Progress (Week 1): Met PT Short Term Goal 3 (Week 1): Pt will ambulate with mod assist +2 for safety and LRAD x 57f PT Short Term Goal 3 - Progress (Week 1): Met PT Short Term Goal 4 (Week 1): Pt will propell WC with supervisoin assist >1081fPT Short Term Goal 4 - Progress (Week 1): Met Week 2:  PT Short Term Goal 1 (Week 2): Pt will ambulate 1511fith max A of 1 PT Short Term Goal 1 - Progress (Week 2): Met PT Short Term Goal 2 (Week 2): Pt will initiate stair navigation PT Short Term Goal 2 - Progress (Week 2): Progressing toward goal PT Short Term Goal 3 (Week 2): Pt initate understanding of managing WC parts PT Short Term Goal 3 - Progress (Week 2): Met Week 3:  PT Short Term Goal 1 (Week 3): STG=LTG due to LOS  Skilled Therapeutic Interventions/Progress Updates:    Pain:  Pt reports no pain.  Treatment to tolerance.  Rest breaks and repositioning as needed.  Pt initially supine and agreeable to treatment session.  Supine to sit w/rail and cues, additional time.  Therapist assisted w/donning shoes.  Pt then requested use of urinal.  Sit to supine w/min assist.  Pt able to manage clothing and use urinal w/set up, additional time. Supine to sit w/use of rail, supervision.  stand pivot transfer to wc to R side w/mod assist. Pt propels wc x 125f100fing L exts w/supervision. In parallel bars worked on the following strengthening/NMRE activities: Standing RLE marches partial ROM Sidestepping single step L/R w/cues for  isolation/posture x 10 reps Seated hamstring curls + knee extension w/foot on slidinboard/towel to decrease friction 2x20 Seated hip abd w/resistance to LLE for pupose of "overflow" effect to RLE w/some activation elicited on R. Wt shifting in standing w/therapist blocking RLE at knee, tactile cues for glut activation. Pt declined further activitiy citing fatigue.  Transported to room. Wc to bed squat pivot to L side w/set up, supervision. Sit to supine w/cues for sequencing, min assist for Les. Pt left supine w/rails up x 4, alarm set, bed in lowest position, and needs in reach. Pt quickly falls asleep.   Therapy Documentation Precautions:  Precautions Precautions: Fall Precaution Comments: R hemi, dysarthria, colostomy Restrictions Weight Bearing Restrictions: No    Therapy/Group: Individual Therapy BarbCallie Fielding  Decorah4/2022, 3:50 PM

## 2021-03-11 NOTE — Progress Notes (Signed)
PROGRESS NOTE   Subjective/Complaints: Sleepy this morning Met 5/5 goals of SLP Team conference today  ROS:  Pt denies SOB, abd pain, CP, N/V/C/D, and vision changes, +GERD     Objective:   No results found. Recent Labs    03/10/21 0619  WBC 8.1  HGB 15.2  HCT 45.1  PLT 172    Recent Labs    03/10/21 0619  NA 137  K 4.4  CL 101  CO2 28  GLUCOSE 84  BUN 13  CREATININE 1.30*  CALCIUM 9.1     Intake/Output Summary (Last 24 hours) at 03/11/2021 1323 Last data filed at 03/11/2021 1256 Gross per 24 hour  Intake 200 ml  Output 1375 ml  Net -1175 ml        Physical Exam: Vital Signs Blood pressure 123/74, pulse 70, temperature 97.7 F (36.5 C), temperature source Oral, resp. rate 17, weight 63.8 kg, SpO2 93 %. Gen: no distress, normal appearing HEENT: oral mucosa pink and moist, NCAT Cardio: Reg rate Chest: normal effort, normal rate of breathing Abd: soft, non-distended Ext: no clubbing, cyanosis, or edema Psych: pleasant and cooperative, quiet Skin: in his sacram/perinal there a blue, rubber retention-like material with associated slight serosanguinous drainage on gauze/foam dressing Neuro: alert, dysarthric. Left gaze preference.  LUE and LLE 5/5. RUE 1-2/5, RLE trace hip/knee extensor synergy  0/5 distally Musculoskeletal: no trunk or extremity pain     Assessment/Plan: 1. Functional deficits which require 3+ hours per day of interdisciplinary therapy in a comprehensive inpatient rehab setting. Physiatrist is providing close team supervision and 24 hour management of active medical problems listed below. Physiatrist and rehab team continue to assess barriers to discharge/monitor patient progress toward functional and medical goals  Care Tool:  Bathing    Body parts bathed by patient: Right arm, Left arm, Chest, Abdomen, Front perineal area, Buttocks, Face, Right upper leg, Left upper leg    Body parts bathed by helper: Left arm, Front perineal area, Buttocks     Bathing assist Assist Level: Set up assist     Upper Body Dressing/Undressing Upper body dressing   What is the patient wearing?: Pull over shirt    Upper body assist Assist Level: Supervision/Verbal cueing    Lower Body Dressing/Undressing Lower body dressing      What is the patient wearing?: Pants, Incontinence brief     Lower body assist Assist for lower body dressing: Minimal Assistance - Patient > 75%     Toileting Toileting    Toileting assist Assist for toileting: Independent with assistive device Assistive Device Comment: urinal   Transfers Chair/bed transfer  Transfers assist     Chair/bed transfer assist level: Minimal Assistance - Patient > 75%     Locomotion Ambulation   Ambulation assist   Ambulation activity did not occur: Safety/medical concerns  Assist level: Moderate Assistance - Patient 50 - 74% Assistive device: Other (comment) (L handrail) Max distance: 53f   Walk 10 feet activity   Assist  Walk 10 feet activity did not occur: Safety/medical concerns  Assist level: Moderate Assistance - Patient - 50 - 74% Assistive device: Other (comment) (L handrail)   Walk 50 feet  activity   Assist Walk 50 feet with 2 turns activity did not occur: Safety/medical concerns (R hemi, decreased balance/postural control, weakness)         Walk 150 feet activity   Assist Walk 150 feet activity did not occur: Safety/medical concerns (R hemi, decreased balance/postural control, weakness)         Walk 10 feet on uneven surface  activity   Assist Walk 10 feet on uneven surfaces activity did not occur: Safety/medical concerns (R hemi, decreased balance/postural control, weakness)         Wheelchair     Assist Will patient use wheelchair at discharge?: Yes Type of Wheelchair: Manual    Wheelchair assist level: Supervision/Verbal cueing Max wheelchair  distance: 150    Wheelchair 50 feet with 2 turns activity    Assist        Assist Level: Supervision/Verbal cueing   Wheelchair 150 feet activity     Assist      Assist Level: Supervision/Verbal cueing   Blood pressure 123/74, pulse 70, temperature 97.7 F (36.5 C), temperature source Oral, resp. rate 17, weight 63.8 kg, SpO2 93 %.  Medical Problem List and Plan: 1.  Dysarthria/right hemiparesis with left gaze preference secondary to multifocal acute ischemia within the bilateral, left greater than right multiple vascular territories due to cardioembolic source             -patient may shower           Continue PT, OT and SLP   D/C date 6/15 2.  Atrial fibrillation: continue pradaxa             -antiplatelet therapy: Aspirin 81 mg daily 3. Rectal pain 2/2 adenocarcinoma: Continue Hydrocodone as needed. D/c tylenol given allergy (palpitations). Added Gabapentin 364m HS. ER Oxycontin q12H 112mstarted 6/6.   6/14: continue current regimen, take PNR norco if needed.   -needs to keep turning in bed as well 4. Mood: Provide emotional support             -antipsychotic agents: N/A 5. Neuropsych: This patient is capable of making decisions on his own behalf. 6. Skin/Wound Care:  Pt with hx of rectal cancer with radiation. Has had chronic wound. Has exposed retention material with slight s/s drainage.   -appreciate WOC RN follow up  -continue local care, keep area clean. Triple paste per WOC RN. Continue foam dressings and change PRN for soilage. 7. Hyponatremia: monitor outpatient  8.  Hypertension. Continue Toprol-XL 50 mg daily.  BP soft- continue to monitor.  Vitals:   03/10/21 1929 03/11/21 0501  BP: 111/72 123/74  Pulse: 61 70  Resp: 16 17  Temp: 97.9 F (36.6 C) 97.7 F (36.5 C)  SpO2: 90% 93%   9.  Hyperlipidemia: Zocor 10.  History of squamous cell carcinoma of the lung Follow-up outpatient 11.  Cardiomyopathy with history of bilateral pulmonary emboli.   Continue Pradaxa 12.  Atrial fibrillation.                HR controlled, continue pradaxa 13.  History of tobacco abuse.  Counseling 14.  GERD: Protonix drop with meds as below 15.  Thrombocytopenia   Resolved  16.  ELevated Hgb- may have some hemoconcentration with elevated BUN/Creat enc po fluid, baseline 15-16, repeat labs Monday 17.  Hx rectal adenoCA with colostomy from 2017- will have issues managing this due to hemiplegia (see above also) 18. AKI: encourage hydration. Trending downward 6/6, slight uptick 6/13, monitor outpatient 19.  Poor appetite: eating much better after upgraded to D3 20. GERD: advised regarding eating smaller more frequent meals, sitting up after eating, added protonix 92m PRN daily for GERD 21. Disposition: d/c 6/15. MD f/u scheduled. Wife updated this week.    LOS: 18 days A FACE TO FACE EVALUATION WAS PERFORMED  Shanna Strength P Chirag Krueger 03/11/2021, 1:23 PM

## 2021-03-12 NOTE — Progress Notes (Signed)
PROGRESS NOTE   Subjective/Complaints: Sleepy this morning Stable for d/c Patient's chart reviewed- No issues reported overnight Vitals signs stable   ROS:  Pt denies SOB, abd pain, CP, N/V/C/D, and vision changes, +GERD     Objective:   No results found. Recent Labs    03/10/21 0619  WBC 8.1  HGB 15.2  HCT 45.1  PLT 172    Recent Labs    03/10/21 0619  NA 137  K 4.4  CL 101  CO2 28  GLUCOSE 84  BUN 13  CREATININE 1.30*  CALCIUM 9.1     Intake/Output Summary (Last 24 hours) at 03/12/2021 1129 Last data filed at 03/12/2021 0728 Gross per 24 hour  Intake 238 ml  Output 700 ml  Net -462 ml        Physical Exam: Vital Signs Blood pressure 126/77, pulse 70, temperature 98 F (36.7 C), temperature source Oral, resp. rate 17, weight 63.8 kg, SpO2 95 %. Gen: no distress, normal appearing HEENT: oral mucosa pink and moist, NCAT Cardio: Reg rate Chest: normal effort, normal rate of breathing Abd: soft, non-distended Ext: no clubbing, cyanosis, or edema Psych: pleasant and cooperative, quiet Skin: in his sacram/perinal there a blue, rubber retention-like material with associated slight serosanguinous drainage on gauze/foam dressing Neuro: alert, dysarthric. Left gaze preference.  LUE and LLE 5/5. RUE 1-2/5, RLE trace hip/knee extensor synergy  0/5 distally Musculoskeletal: no trunk or extremity pain     Assessment/Plan: 1. Functional deficits which require 3+ hours per day of interdisciplinary therapy in a comprehensive inpatient rehab setting. Physiatrist is providing close team supervision and 24 hour management of active medical problems listed below. Physiatrist and rehab team continue to assess barriers to discharge/monitor patient progress toward functional and medical goals  Care Tool:  Bathing    Body parts bathed by patient: Right arm, Left arm, Chest, Abdomen, Front perineal area,  Buttocks, Face, Right upper leg, Left upper leg   Body parts bathed by helper: Left arm, Front perineal area, Buttocks     Bathing assist Assist Level: Set up assist     Upper Body Dressing/Undressing Upper body dressing   What is the patient wearing?: Pull over shirt    Upper body assist Assist Level: Supervision/Verbal cueing    Lower Body Dressing/Undressing Lower body dressing      What is the patient wearing?: Pants, Incontinence brief     Lower body assist Assist for lower body dressing: Minimal Assistance - Patient > 75%     Toileting Toileting    Toileting assist Assist for toileting: Independent with assistive device Assistive Device Comment: urinal   Transfers Chair/bed transfer  Transfers assist     Chair/bed transfer assist level: Minimal Assistance - Patient > 75%     Locomotion Ambulation   Ambulation assist   Ambulation activity did not occur: Safety/medical concerns  Assist level: Moderate Assistance - Patient 50 - 74% Assistive device: Other (comment) (L handrail) Max distance: 18ft   Walk 10 feet activity   Assist  Walk 10 feet activity did not occur: Safety/medical concerns  Assist level: Moderate Assistance - Patient - 50 - 74% Assistive device: Other (comment) (L  handrail)   Walk 50 feet activity   Assist Walk 50 feet with 2 turns activity did not occur: Safety/medical concerns (R hemi, decreased balance/postural control, weakness)         Walk 150 feet activity   Assist Walk 150 feet activity did not occur: Safety/medical concerns (R hemi, decreased balance/postural control, weakness)         Walk 10 feet on uneven surface  activity   Assist Walk 10 feet on uneven surfaces activity did not occur: Safety/medical concerns (R hemi, decreased balance/postural control, weakness)         Wheelchair     Assist Will patient use wheelchair at discharge?: Yes Type of Wheelchair: Manual    Wheelchair assist  level: Supervision/Verbal cueing Max wheelchair distance: 150    Wheelchair 50 feet with 2 turns activity    Assist        Assist Level: Supervision/Verbal cueing   Wheelchair 150 feet activity     Assist      Assist Level: Supervision/Verbal cueing   Blood pressure 126/77, pulse 70, temperature 98 F (36.7 C), temperature source Oral, resp. rate 17, weight 63.8 kg, SpO2 95 %.  Medical Problem List and Plan: 1.  Dysarthria/right hemiparesis with left gaze preference secondary to multifocal acute ischemia within the bilateral, left greater than right multiple vascular territories due to cardioembolic source             d/c today 2.  Atrial fibrillation: continue pradaxa             -antiplatelet therapy: Aspirin 81 mg daily 3. Rectal pain 2/2 adenocarcinoma: Continue Hydrocodone as needed. D/c tylenol given allergy (palpitations). Added Gabapentin 300mg  HS. ER Oxycontin q12H 10mg  started 6/6.   6/15: continue current regimen, take PNR norco if needed.   -needs to keep turning in bed as well 4. Mood: Provide emotional support             -antipsychotic agents: N/A 5. Neuropsych: This patient is capable of making decisions on his own behalf. 6. Skin/Wound Care:  Pt with hx of rectal cancer with radiation. Has had chronic wound. Has exposed retention material with slight s/s drainage.   -appreciate WOC RN follow up  -continue local care, keep area clean. Triple paste per WOC RN. Continue foam dressings and change PRN for soilage. 7. Hyponatremia: monitor outpatient  8.  Hypertension. Well controlled, continue Toprol-XL 50 mg daily.  BP soft- continue to monitor.  Vitals:   03/11/21 1925 03/12/21 0423  BP: 119/66 126/77  Pulse: 67 70  Resp: 16 17  Temp: 97.7 F (36.5 C) 98 F (36.7 C)  SpO2: 90% 95%   9.  Hyperlipidemia: Continue Zocor 10.  History of squamous cell carcinoma of the lung Follow-up outpatient 11.  Cardiomyopathy with history of bilateral pulmonary  emboli.  Continue Pradaxa 12.  Atrial fibrillation.                HR controlled, continue pradaxa 13.  History of tobacco abuse.  Counseling 14.  GERD: Protonix drop with meds as below 15.  Thrombocytopenia   Resolved  16.  ELevated Hgb- may have some hemoconcentration with elevated BUN/Creat enc po fluid, baseline 15-16, repeat labs Monday 17.  Hx rectal adenoCA with colostomy from 2017- will have issues managing this due to hemiplegia (see above also) 18. AKI: encourage hydration. Trending downward 6/6, slight uptick 6/13, monitor outpatient 19.  Poor appetite: eating much better after upgraded to D3 20. GERD:  advised regarding eating smaller more frequent meals, sitting up after eating, added protonix 20mg  PRN daily for GERD 21. Disposition: d/c 6/15. MD f/u scheduled. Wife updated this week.    >30 minutes spent in discharge of patient including review of medications and follow-up appointments, physical examination, and in answering all patient's questions    LOS: 19 days A FACE TO FACE EVALUATION WAS [place] 03/12/2021, 11:29 AM

## 2021-03-12 NOTE — Progress Notes (Signed)
Inpatient Rehabilitation Care Coordinator Discharge Note  The overall goal for the admission was met for:   Discharge location: Yes-HOME WITH WIFE WHO CAN PROVIDE 24/7 CARE  Length of Stay: Yes-19 DAYS  Discharge activity level: Yes-MIN ASSIST WHEELCHAIR LEVEL  Home/community participation: Yes  Services provided included: MD, RD, PT, OT, SLP, RN, CM, Pharmacy, Neuropsych, and SW  Financial Services: Medicaid and Private Insurance: [place] offered to/list presented to:PT AND WIFE  Follow-up services arranged: Home Health: [facility]-PT,OT,SP,RN, DME: [facility]-WHEELCHAIR AND 3 IN 1, and Patient/Family has no preference for HH/DME agencies  Comments (or additional information):WIFE IN FOR [place]. WILL HAVE HELP FROM HIS CHILDREN. WILL BUILD RAMP ONCE HOME  Patient/Family verbalized understanding of follow-up arrangements: Yes  Individual responsible for coordination of the follow-up plan: [name]-WIFE [phone]  Confirmed correct DME delivered: Elease Hashimoto 03/12/2021    Si Jachim, [place]

## 2021-03-12 NOTE — Progress Notes (Signed)
Pt discharge to home via EMS. Wife picked up equipment and personal belongings. Discharge instructions provided by PA. Wife and pt verbalized an understanding of instructions, medications and follow up appts. Colostomy emptied and pt voided prior to discharge.

## 2021-03-13 ENCOUNTER — Other Ambulatory Visit: Payer: Self-pay | Admitting: *Deleted

## 2021-03-13 ENCOUNTER — Encounter: Payer: Self-pay | Admitting: Physical Medicine and Rehabilitation

## 2021-03-14 ENCOUNTER — Telehealth: Payer: Self-pay

## 2021-03-14 NOTE — Telephone Encounter (Signed)
Transition Care Management Unsuccessful Follow-up Telephone Call  Date of discharge and from where:  03/12/21 from [facility]  Attempts:  1st Attempt  Reason for unsuccessful TCM follow-up call:  Left voice message with wife [place]

## 2021-03-15 DIAGNOSIS — K602 Anal fissure, unspecified: Secondary | ICD-10-CM | POA: Diagnosis not present

## 2021-03-15 DIAGNOSIS — I69318 Other symptoms and signs involving cognitive functions following cerebral infarction: Secondary | ICD-10-CM | POA: Diagnosis not present

## 2021-03-15 DIAGNOSIS — I10 Essential (primary) hypertension: Secondary | ICD-10-CM | POA: Diagnosis not present

## 2021-03-15 DIAGNOSIS — C20 Malignant neoplasm of rectum: Secondary | ICD-10-CM | POA: Diagnosis not present

## 2021-03-15 DIAGNOSIS — I69322 Dysarthria following cerebral infarction: Secondary | ICD-10-CM | POA: Diagnosis not present

## 2021-03-15 DIAGNOSIS — C3490 Malignant neoplasm of unspecified part of unspecified bronchus or lung: Secondary | ICD-10-CM | POA: Diagnosis not present

## 2021-03-15 DIAGNOSIS — I69351 Hemiplegia and hemiparesis following cerebral infarction affecting right dominant side: Secondary | ICD-10-CM | POA: Diagnosis not present

## 2021-03-15 DIAGNOSIS — I2510 Atherosclerotic heart disease of native coronary artery without angina pectoris: Secondary | ICD-10-CM | POA: Diagnosis not present

## 2021-03-15 DIAGNOSIS — G8929 Other chronic pain: Secondary | ICD-10-CM | POA: Diagnosis not present

## 2021-03-17 ENCOUNTER — Telehealth: Payer: Self-pay | Admitting: *Deleted

## 2021-03-17 ENCOUNTER — Encounter (HOSPITAL_COMMUNITY): Payer: Self-pay | Admitting: Hematology

## 2021-03-17 DIAGNOSIS — K602 Anal fissure, unspecified: Secondary | ICD-10-CM | POA: Diagnosis not present

## 2021-03-17 DIAGNOSIS — I2510 Atherosclerotic heart disease of native coronary artery without angina pectoris: Secondary | ICD-10-CM | POA: Diagnosis not present

## 2021-03-17 DIAGNOSIS — I69322 Dysarthria following cerebral infarction: Secondary | ICD-10-CM | POA: Diagnosis not present

## 2021-03-17 DIAGNOSIS — I69318 Other symptoms and signs involving cognitive functions following cerebral infarction: Secondary | ICD-10-CM | POA: Diagnosis not present

## 2021-03-17 DIAGNOSIS — C3490 Malignant neoplasm of unspecified part of unspecified bronchus or lung: Secondary | ICD-10-CM | POA: Diagnosis not present

## 2021-03-17 DIAGNOSIS — I10 Essential (primary) hypertension: Secondary | ICD-10-CM | POA: Diagnosis not present

## 2021-03-17 DIAGNOSIS — I69351 Hemiplegia and hemiparesis following cerebral infarction affecting right dominant side: Secondary | ICD-10-CM | POA: Diagnosis not present

## 2021-03-17 DIAGNOSIS — C20 Malignant neoplasm of rectum: Secondary | ICD-10-CM | POA: Diagnosis not present

## 2021-03-17 DIAGNOSIS — G8929 Other chronic pain: Secondary | ICD-10-CM | POA: Diagnosis not present

## 2021-03-17 NOTE — Telephone Encounter (Signed)
Linna Hoff will take care of this issue.

## 2021-03-17 NOTE — Telephone Encounter (Signed)
Sharyn Lull OT from [facility] called to request [place] orders for 2wk1, 1wk2,2wk3, 1wk 2.  Approval given.

## 2021-03-18 DIAGNOSIS — K602 Anal fissure, unspecified: Secondary | ICD-10-CM | POA: Diagnosis not present

## 2021-03-18 DIAGNOSIS — C3490 Malignant neoplasm of unspecified part of unspecified bronchus or lung: Secondary | ICD-10-CM | POA: Diagnosis not present

## 2021-03-18 DIAGNOSIS — G8929 Other chronic pain: Secondary | ICD-10-CM | POA: Diagnosis not present

## 2021-03-18 DIAGNOSIS — I69351 Hemiplegia and hemiparesis following cerebral infarction affecting right dominant side: Secondary | ICD-10-CM | POA: Diagnosis not present

## 2021-03-18 DIAGNOSIS — I10 Essential (primary) hypertension: Secondary | ICD-10-CM | POA: Diagnosis not present

## 2021-03-18 DIAGNOSIS — I69318 Other symptoms and signs involving cognitive functions following cerebral infarction: Secondary | ICD-10-CM | POA: Diagnosis not present

## 2021-03-18 DIAGNOSIS — C20 Malignant neoplasm of rectum: Secondary | ICD-10-CM | POA: Diagnosis not present

## 2021-03-18 DIAGNOSIS — I69322 Dysarthria following cerebral infarction: Secondary | ICD-10-CM | POA: Diagnosis not present

## 2021-03-18 DIAGNOSIS — I2510 Atherosclerotic heart disease of native coronary artery without angina pectoris: Secondary | ICD-10-CM | POA: Diagnosis not present

## 2021-03-19 DIAGNOSIS — I2510 Atherosclerotic heart disease of native coronary artery without angina pectoris: Secondary | ICD-10-CM | POA: Diagnosis not present

## 2021-03-19 DIAGNOSIS — C20 Malignant neoplasm of rectum: Secondary | ICD-10-CM | POA: Diagnosis not present

## 2021-03-19 DIAGNOSIS — I69351 Hemiplegia and hemiparesis following cerebral infarction affecting right dominant side: Secondary | ICD-10-CM | POA: Diagnosis not present

## 2021-03-19 DIAGNOSIS — C3490 Malignant neoplasm of unspecified part of unspecified bronchus or lung: Secondary | ICD-10-CM | POA: Diagnosis not present

## 2021-03-19 DIAGNOSIS — K602 Anal fissure, unspecified: Secondary | ICD-10-CM | POA: Diagnosis not present

## 2021-03-19 DIAGNOSIS — I10 Essential (primary) hypertension: Secondary | ICD-10-CM | POA: Diagnosis not present

## 2021-03-19 DIAGNOSIS — G8929 Other chronic pain: Secondary | ICD-10-CM | POA: Diagnosis not present

## 2021-03-19 DIAGNOSIS — I69318 Other symptoms and signs involving cognitive functions following cerebral infarction: Secondary | ICD-10-CM | POA: Diagnosis not present

## 2021-03-19 DIAGNOSIS — I69322 Dysarthria following cerebral infarction: Secondary | ICD-10-CM | POA: Diagnosis not present

## 2021-03-20 DIAGNOSIS — I69322 Dysarthria following cerebral infarction: Secondary | ICD-10-CM | POA: Diagnosis not present

## 2021-03-20 DIAGNOSIS — I10 Essential (primary) hypertension: Secondary | ICD-10-CM | POA: Diagnosis not present

## 2021-03-20 DIAGNOSIS — C20 Malignant neoplasm of rectum: Secondary | ICD-10-CM | POA: Diagnosis not present

## 2021-03-20 DIAGNOSIS — I69351 Hemiplegia and hemiparesis following cerebral infarction affecting right dominant side: Secondary | ICD-10-CM | POA: Diagnosis not present

## 2021-03-20 DIAGNOSIS — I2510 Atherosclerotic heart disease of native coronary artery without angina pectoris: Secondary | ICD-10-CM | POA: Diagnosis not present

## 2021-03-20 DIAGNOSIS — C3490 Malignant neoplasm of unspecified part of unspecified bronchus or lung: Secondary | ICD-10-CM | POA: Diagnosis not present

## 2021-03-20 DIAGNOSIS — K602 Anal fissure, unspecified: Secondary | ICD-10-CM | POA: Diagnosis not present

## 2021-03-20 DIAGNOSIS — I69318 Other symptoms and signs involving cognitive functions following cerebral infarction: Secondary | ICD-10-CM | POA: Diagnosis not present

## 2021-03-20 DIAGNOSIS — G8929 Other chronic pain: Secondary | ICD-10-CM | POA: Diagnosis not present

## 2021-03-24 DIAGNOSIS — I69351 Hemiplegia and hemiparesis following cerebral infarction affecting right dominant side: Secondary | ICD-10-CM | POA: Diagnosis not present

## 2021-03-24 DIAGNOSIS — I69318 Other symptoms and signs involving cognitive functions following cerebral infarction: Secondary | ICD-10-CM | POA: Diagnosis not present

## 2021-03-24 DIAGNOSIS — G8929 Other chronic pain: Secondary | ICD-10-CM | POA: Diagnosis not present

## 2021-03-24 DIAGNOSIS — C3490 Malignant neoplasm of unspecified part of unspecified bronchus or lung: Secondary | ICD-10-CM | POA: Diagnosis not present

## 2021-03-24 DIAGNOSIS — I69322 Dysarthria following cerebral infarction: Secondary | ICD-10-CM | POA: Diagnosis not present

## 2021-03-24 DIAGNOSIS — I639 Cerebral infarction, unspecified: Secondary | ICD-10-CM | POA: Diagnosis not present

## 2021-03-24 DIAGNOSIS — K5903 Drug induced constipation: Secondary | ICD-10-CM | POA: Diagnosis not present

## 2021-03-24 DIAGNOSIS — G8191 Hemiplegia, unspecified affecting right dominant side: Secondary | ICD-10-CM | POA: Diagnosis not present

## 2021-03-24 DIAGNOSIS — K602 Anal fissure, unspecified: Secondary | ICD-10-CM | POA: Diagnosis not present

## 2021-03-24 DIAGNOSIS — I2510 Atherosclerotic heart disease of native coronary artery without angina pectoris: Secondary | ICD-10-CM | POA: Diagnosis not present

## 2021-03-24 DIAGNOSIS — I10 Essential (primary) hypertension: Secondary | ICD-10-CM | POA: Diagnosis not present

## 2021-03-24 DIAGNOSIS — C20 Malignant neoplasm of rectum: Secondary | ICD-10-CM | POA: Diagnosis not present

## 2021-03-24 NOTE — Telephone Encounter (Signed)
Hospital follow up 05/02/2021 Dr. Ranell Patrick

## 2021-03-25 ENCOUNTER — Telehealth: Payer: Self-pay | Admitting: *Deleted

## 2021-03-25 NOTE — Telephone Encounter (Signed)
[facility] called and said that per pts request, they are moving his next week OT appt to this week due to holiday.

## 2021-03-26 DIAGNOSIS — I69318 Other symptoms and signs involving cognitive functions following cerebral infarction: Secondary | ICD-10-CM | POA: Diagnosis not present

## 2021-03-26 DIAGNOSIS — C3490 Malignant neoplasm of unspecified part of unspecified bronchus or lung: Secondary | ICD-10-CM | POA: Diagnosis not present

## 2021-03-26 DIAGNOSIS — I2510 Atherosclerotic heart disease of native coronary artery without angina pectoris: Secondary | ICD-10-CM | POA: Diagnosis not present

## 2021-03-26 DIAGNOSIS — K602 Anal fissure, unspecified: Secondary | ICD-10-CM | POA: Diagnosis not present

## 2021-03-26 DIAGNOSIS — C20 Malignant neoplasm of rectum: Secondary | ICD-10-CM | POA: Diagnosis not present

## 2021-03-26 DIAGNOSIS — I10 Essential (primary) hypertension: Secondary | ICD-10-CM | POA: Diagnosis not present

## 2021-03-26 DIAGNOSIS — G8929 Other chronic pain: Secondary | ICD-10-CM | POA: Diagnosis not present

## 2021-03-26 DIAGNOSIS — I69322 Dysarthria following cerebral infarction: Secondary | ICD-10-CM | POA: Diagnosis not present

## 2021-03-26 DIAGNOSIS — I69351 Hemiplegia and hemiparesis following cerebral infarction affecting right dominant side: Secondary | ICD-10-CM | POA: Diagnosis not present

## 2021-03-28 DIAGNOSIS — R531 Weakness: Secondary | ICD-10-CM | POA: Diagnosis not present

## 2021-03-28 DIAGNOSIS — K7689 Other specified diseases of liver: Secondary | ICD-10-CM | POA: Diagnosis not present

## 2021-03-28 DIAGNOSIS — I70209 Unspecified atherosclerosis of native arteries of extremities, unspecified extremity: Secondary | ICD-10-CM | POA: Diagnosis not present

## 2021-03-28 DIAGNOSIS — R1110 Vomiting, unspecified: Secondary | ICD-10-CM | POA: Diagnosis not present

## 2021-03-28 DIAGNOSIS — C801 Malignant (primary) neoplasm, unspecified: Secondary | ICD-10-CM | POA: Diagnosis not present

## 2021-03-28 DIAGNOSIS — Z885 Allergy status to narcotic agent status: Secondary | ICD-10-CM | POA: Diagnosis not present

## 2021-03-28 DIAGNOSIS — Z8673 Personal history of transient ischemic attack (TIA), and cerebral infarction without residual deficits: Secondary | ICD-10-CM | POA: Diagnosis not present

## 2021-03-28 DIAGNOSIS — I700 Atherosclerosis of aorta: Secondary | ICD-10-CM | POA: Diagnosis not present

## 2021-03-28 DIAGNOSIS — R1032 Left lower quadrant pain: Secondary | ICD-10-CM | POA: Diagnosis not present

## 2021-03-28 DIAGNOSIS — C799 Secondary malignant neoplasm of unspecified site: Secondary | ICD-10-CM | POA: Diagnosis not present

## 2021-03-28 DIAGNOSIS — J90 Pleural effusion, not elsewhere classified: Secondary | ICD-10-CM | POA: Diagnosis not present

## 2021-03-28 DIAGNOSIS — C787 Secondary malignant neoplasm of liver and intrahepatic bile duct: Secondary | ICD-10-CM | POA: Diagnosis not present

## 2021-03-29 DIAGNOSIS — R0902 Hypoxemia: Secondary | ICD-10-CM | POA: Diagnosis not present

## 2021-03-29 DIAGNOSIS — R52 Pain, unspecified: Secondary | ICD-10-CM | POA: Diagnosis not present

## 2021-03-29 DIAGNOSIS — R404 Transient alteration of awareness: Secondary | ICD-10-CM | POA: Diagnosis not present

## 2021-03-29 DIAGNOSIS — R531 Weakness: Secondary | ICD-10-CM | POA: Diagnosis not present

## 2021-03-29 DIAGNOSIS — R1084 Generalized abdominal pain: Secondary | ICD-10-CM | POA: Diagnosis not present

## 2021-03-31 ENCOUNTER — Inpatient Hospital Stay (HOSPITAL_COMMUNITY)
Admission: EM | Admit: 2021-03-31 | Discharge: 2021-04-28 | DRG: 270 | Disposition: E | Payer: Medicare PPO | Attending: Cardiology | Admitting: Cardiology

## 2021-03-31 ENCOUNTER — Encounter (HOSPITAL_COMMUNITY): Payer: Self-pay | Admitting: Hematology

## 2021-03-31 ENCOUNTER — Inpatient Hospital Stay (HOSPITAL_COMMUNITY): Admission: EM | Disposition: E | Payer: Self-pay | Source: Home / Self Care | Attending: Cardiovascular Disease

## 2021-03-31 ENCOUNTER — Emergency Department (HOSPITAL_COMMUNITY): Payer: Medicare PPO

## 2021-03-31 ENCOUNTER — Other Ambulatory Visit: Payer: Self-pay

## 2021-03-31 ENCOUNTER — Encounter (HOSPITAL_COMMUNITY): Payer: Self-pay | Admitting: Cardiology

## 2021-03-31 DIAGNOSIS — I2119 ST elevation (STEMI) myocardial infarction involving other coronary artery of inferior wall: Principal | ICD-10-CM

## 2021-03-31 DIAGNOSIS — J9811 Atelectasis: Secondary | ICD-10-CM | POA: Diagnosis present

## 2021-03-31 DIAGNOSIS — N184 Chronic kidney disease, stage 4 (severe): Secondary | ICD-10-CM

## 2021-03-31 DIAGNOSIS — D6489 Other specified anemias: Secondary | ICD-10-CM | POA: Diagnosis present

## 2021-03-31 DIAGNOSIS — N179 Acute kidney failure, unspecified: Secondary | ICD-10-CM | POA: Diagnosis present

## 2021-03-31 DIAGNOSIS — I517 Cardiomegaly: Secondary | ICD-10-CM | POA: Diagnosis not present

## 2021-03-31 DIAGNOSIS — I2510 Atherosclerotic heart disease of native coronary artery without angina pectoris: Secondary | ICD-10-CM | POA: Diagnosis not present

## 2021-03-31 DIAGNOSIS — N1831 Chronic kidney disease, stage 3a: Secondary | ICD-10-CM | POA: Diagnosis present

## 2021-03-31 DIAGNOSIS — I739 Peripheral vascular disease, unspecified: Secondary | ICD-10-CM | POA: Diagnosis present

## 2021-03-31 DIAGNOSIS — R0602 Shortness of breath: Secondary | ICD-10-CM | POA: Diagnosis not present

## 2021-03-31 DIAGNOSIS — J9601 Acute respiratory failure with hypoxia: Secondary | ICD-10-CM | POA: Diagnosis not present

## 2021-03-31 DIAGNOSIS — I5043 Acute on chronic combined systolic (congestive) and diastolic (congestive) heart failure: Secondary | ICD-10-CM | POA: Diagnosis not present

## 2021-03-31 DIAGNOSIS — R5381 Other malaise: Secondary | ICD-10-CM | POA: Diagnosis present

## 2021-03-31 DIAGNOSIS — I69351 Hemiplegia and hemiparesis following cerebral infarction affecting right dominant side: Secondary | ICD-10-CM | POA: Diagnosis not present

## 2021-03-31 DIAGNOSIS — I4819 Other persistent atrial fibrillation: Secondary | ICD-10-CM | POA: Diagnosis not present

## 2021-03-31 DIAGNOSIS — J439 Emphysema, unspecified: Secondary | ICD-10-CM | POA: Diagnosis not present

## 2021-03-31 DIAGNOSIS — I480 Paroxysmal atrial fibrillation: Secondary | ICD-10-CM | POA: Diagnosis not present

## 2021-03-31 DIAGNOSIS — Z515 Encounter for palliative care: Secondary | ICD-10-CM | POA: Diagnosis not present

## 2021-03-31 DIAGNOSIS — R64 Cachexia: Secondary | ICD-10-CM | POA: Diagnosis not present

## 2021-03-31 DIAGNOSIS — I4891 Unspecified atrial fibrillation: Secondary | ICD-10-CM | POA: Diagnosis present

## 2021-03-31 DIAGNOSIS — R7989 Other specified abnormal findings of blood chemistry: Secondary | ICD-10-CM | POA: Diagnosis present

## 2021-03-31 DIAGNOSIS — J910 Malignant pleural effusion: Secondary | ICD-10-CM | POA: Diagnosis not present

## 2021-03-31 DIAGNOSIS — I2111 ST elevation (STEMI) myocardial infarction involving right coronary artery: Secondary | ICD-10-CM

## 2021-03-31 DIAGNOSIS — I255 Ischemic cardiomyopathy: Secondary | ICD-10-CM

## 2021-03-31 DIAGNOSIS — K602 Anal fissure, unspecified: Secondary | ICD-10-CM | POA: Diagnosis not present

## 2021-03-31 DIAGNOSIS — Z9049 Acquired absence of other specified parts of digestive tract: Secondary | ICD-10-CM

## 2021-03-31 DIAGNOSIS — Z933 Colostomy status: Secondary | ICD-10-CM

## 2021-03-31 DIAGNOSIS — Z955 Presence of coronary angioplasty implant and graft: Secondary | ICD-10-CM | POA: Diagnosis not present

## 2021-03-31 DIAGNOSIS — R570 Cardiogenic shock: Secondary | ICD-10-CM | POA: Diagnosis not present

## 2021-03-31 DIAGNOSIS — Z9221 Personal history of antineoplastic chemotherapy: Secondary | ICD-10-CM

## 2021-03-31 DIAGNOSIS — J9600 Acute respiratory failure, unspecified whether with hypoxia or hypercapnia: Secondary | ICD-10-CM

## 2021-03-31 DIAGNOSIS — G8929 Other chronic pain: Secondary | ICD-10-CM | POA: Diagnosis present

## 2021-03-31 DIAGNOSIS — J90 Pleural effusion, not elsewhere classified: Secondary | ICD-10-CM | POA: Diagnosis present

## 2021-03-31 DIAGNOSIS — Z86718 Personal history of other venous thrombosis and embolism: Secondary | ICD-10-CM

## 2021-03-31 DIAGNOSIS — I959 Hypotension, unspecified: Secondary | ICD-10-CM | POA: Diagnosis present

## 2021-03-31 DIAGNOSIS — I130 Hypertensive heart and chronic kidney disease with heart failure and stage 1 through stage 4 chronic kidney disease, or unspecified chronic kidney disease: Secondary | ICD-10-CM | POA: Diagnosis present

## 2021-03-31 DIAGNOSIS — I213 ST elevation (STEMI) myocardial infarction of unspecified site: Secondary | ICD-10-CM

## 2021-03-31 DIAGNOSIS — Z8673 Personal history of transient ischemic attack (TIA), and cerebral infarction without residual deficits: Secondary | ICD-10-CM

## 2021-03-31 DIAGNOSIS — F419 Anxiety disorder, unspecified: Secondary | ICD-10-CM | POA: Diagnosis present

## 2021-03-31 DIAGNOSIS — Z6822 Body mass index (BMI) 22.0-22.9, adult: Secondary | ICD-10-CM | POA: Diagnosis not present

## 2021-03-31 DIAGNOSIS — Z923 Personal history of irradiation: Secondary | ICD-10-CM

## 2021-03-31 DIAGNOSIS — C20 Malignant neoplasm of rectum: Secondary | ICD-10-CM | POA: Diagnosis not present

## 2021-03-31 DIAGNOSIS — Z7901 Long term (current) use of anticoagulants: Secondary | ICD-10-CM

## 2021-03-31 DIAGNOSIS — I69322 Dysarthria following cerebral infarction: Secondary | ICD-10-CM | POA: Diagnosis not present

## 2021-03-31 DIAGNOSIS — K219 Gastro-esophageal reflux disease without esophagitis: Secondary | ICD-10-CM | POA: Diagnosis present

## 2021-03-31 DIAGNOSIS — Z66 Do not resuscitate: Secondary | ICD-10-CM | POA: Diagnosis not present

## 2021-03-31 DIAGNOSIS — C3490 Malignant neoplasm of unspecified part of unspecified bronchus or lung: Secondary | ICD-10-CM | POA: Diagnosis present

## 2021-03-31 DIAGNOSIS — R0600 Dyspnea, unspecified: Secondary | ICD-10-CM | POA: Diagnosis not present

## 2021-03-31 DIAGNOSIS — I69318 Other symptoms and signs involving cognitive functions following cerebral infarction: Secondary | ICD-10-CM | POA: Diagnosis not present

## 2021-03-31 DIAGNOSIS — Z8249 Family history of ischemic heart disease and other diseases of the circulatory system: Secondary | ICD-10-CM

## 2021-03-31 DIAGNOSIS — R52 Pain, unspecified: Secondary | ICD-10-CM | POA: Diagnosis not present

## 2021-03-31 DIAGNOSIS — R7401 Elevation of levels of liver transaminase levels: Secondary | ICD-10-CM | POA: Diagnosis present

## 2021-03-31 DIAGNOSIS — Z885 Allergy status to narcotic agent status: Secondary | ICD-10-CM

## 2021-03-31 DIAGNOSIS — C3491 Malignant neoplasm of unspecified part of right bronchus or lung: Secondary | ICD-10-CM | POA: Diagnosis present

## 2021-03-31 DIAGNOSIS — Z9889 Other specified postprocedural states: Secondary | ICD-10-CM

## 2021-03-31 DIAGNOSIS — E785 Hyperlipidemia, unspecified: Secondary | ICD-10-CM | POA: Diagnosis present

## 2021-03-31 DIAGNOSIS — Z79899 Other long term (current) drug therapy: Secondary | ICD-10-CM

## 2021-03-31 DIAGNOSIS — F17210 Nicotine dependence, cigarettes, uncomplicated: Secondary | ICD-10-CM | POA: Diagnosis present

## 2021-03-31 DIAGNOSIS — J9602 Acute respiratory failure with hypercapnia: Secondary | ICD-10-CM | POA: Diagnosis not present

## 2021-03-31 DIAGNOSIS — C19 Malignant neoplasm of rectosigmoid junction: Secondary | ICD-10-CM | POA: Diagnosis present

## 2021-03-31 DIAGNOSIS — E43 Unspecified severe protein-calorie malnutrition: Secondary | ICD-10-CM | POA: Diagnosis present

## 2021-03-31 DIAGNOSIS — I5022 Chronic systolic (congestive) heart failure: Secondary | ICD-10-CM | POA: Diagnosis present

## 2021-03-31 DIAGNOSIS — C787 Secondary malignant neoplasm of liver and intrahepatic bile duct: Secondary | ICD-10-CM | POA: Diagnosis present

## 2021-03-31 DIAGNOSIS — M879 Osteonecrosis, unspecified: Secondary | ICD-10-CM | POA: Diagnosis present

## 2021-03-31 DIAGNOSIS — I429 Cardiomyopathy, unspecified: Secondary | ICD-10-CM

## 2021-03-31 DIAGNOSIS — Z993 Dependence on wheelchair: Secondary | ICD-10-CM

## 2021-03-31 DIAGNOSIS — Z20822 Contact with and (suspected) exposure to covid-19: Secondary | ICD-10-CM | POA: Diagnosis not present

## 2021-03-31 DIAGNOSIS — I499 Cardiac arrhythmia, unspecified: Secondary | ICD-10-CM | POA: Diagnosis not present

## 2021-03-31 DIAGNOSIS — I2101 ST elevation (STEMI) myocardial infarction involving left main coronary artery: Secondary | ICD-10-CM

## 2021-03-31 DIAGNOSIS — R079 Chest pain, unspecified: Secondary | ICD-10-CM | POA: Diagnosis not present

## 2021-03-31 DIAGNOSIS — Z86711 Personal history of pulmonary embolism: Secondary | ICD-10-CM

## 2021-03-31 DIAGNOSIS — I10 Essential (primary) hypertension: Secondary | ICD-10-CM | POA: Diagnosis not present

## 2021-03-31 DIAGNOSIS — Z720 Tobacco use: Secondary | ICD-10-CM | POA: Diagnosis present

## 2021-03-31 DIAGNOSIS — Z8711 Personal history of peptic ulcer disease: Secondary | ICD-10-CM

## 2021-03-31 DIAGNOSIS — Z4509 Encounter for adjustment and management of other cardiac device: Secondary | ICD-10-CM

## 2021-03-31 DIAGNOSIS — Z7982 Long term (current) use of aspirin: Secondary | ICD-10-CM

## 2021-03-31 DIAGNOSIS — R54 Age-related physical debility: Secondary | ICD-10-CM | POA: Diagnosis present

## 2021-03-31 DIAGNOSIS — Z7189 Other specified counseling: Secondary | ICD-10-CM

## 2021-03-31 DIAGNOSIS — R0789 Other chest pain: Secondary | ICD-10-CM | POA: Diagnosis not present

## 2021-03-31 DIAGNOSIS — M545 Low back pain, unspecified: Secondary | ICD-10-CM | POA: Diagnosis present

## 2021-03-31 DIAGNOSIS — C801 Malignant (primary) neoplasm, unspecified: Secondary | ICD-10-CM | POA: Diagnosis not present

## 2021-03-31 HISTORY — DX: ST elevation (STEMI) myocardial infarction involving other coronary artery of inferior wall: I21.19

## 2021-03-31 HISTORY — PX: CORONARY/GRAFT ACUTE MI REVASCULARIZATION: CATH118305

## 2021-03-31 HISTORY — DX: Cardiomyopathy, unspecified: I42.9

## 2021-03-31 HISTORY — DX: ST elevation (STEMI) myocardial infarction of unspecified site: I21.3

## 2021-03-31 HISTORY — DX: Long term (current) use of anticoagulants: Z79.01

## 2021-03-31 HISTORY — PX: IABP INSERTION: CATH118242

## 2021-03-31 HISTORY — PX: RIGHT/LEFT HEART CATH AND CORONARY ANGIOGRAPHY: CATH118266

## 2021-03-31 LAB — POCT I-STAT 7, (LYTES, BLD GAS, ICA,H+H)
Acid-base deficit: 5 mmol/L — ABNORMAL HIGH (ref 0.0–2.0)
Bicarbonate: 20.1 mmol/L (ref 20.0–28.0)
Calcium, Ion: 1.16 mmol/L (ref 1.15–1.40)
HCT: 35 % — ABNORMAL LOW (ref 39.0–52.0)
Hemoglobin: 11.9 g/dL — ABNORMAL LOW (ref 13.0–17.0)
O2 Saturation: 98 %
Potassium: 4.8 mmol/L (ref 3.5–5.1)
Sodium: 136 mmol/L (ref 135–145)
TCO2: 21 mmol/L — ABNORMAL LOW (ref 22–32)
pCO2 arterial: 35.2 mmHg (ref 32.0–48.0)
pH, Arterial: 7.364 (ref 7.350–7.450)
pO2, Arterial: 105 mmHg (ref 83.0–108.0)

## 2021-03-31 LAB — BASIC METABOLIC PANEL
Anion gap: 10 (ref 5–15)
BUN: 28 mg/dL — ABNORMAL HIGH (ref 8–23)
CO2: 20 mmol/L — ABNORMAL LOW (ref 22–32)
Calcium: 8.1 mg/dL — ABNORMAL LOW (ref 8.9–10.3)
Chloride: 100 mmol/L (ref 98–111)
Creatinine, Ser: 2.04 mg/dL — ABNORMAL HIGH (ref 0.61–1.24)
GFR, Estimated: 36 mL/min — ABNORMAL LOW (ref >60)
Glucose, Bld: 220 mg/dL — ABNORMAL HIGH (ref 70–99)
Potassium: 4.5 mmol/L (ref 3.5–5.1)
Sodium: 130 mmol/L — ABNORMAL LOW (ref 135–145)

## 2021-03-31 LAB — CBC
HCT: 38.4 % — ABNORMAL LOW (ref 39.0–52.0)
Hemoglobin: 12.6 g/dL — ABNORMAL LOW (ref 13.0–17.0)
MCH: 32.6 pg (ref 26.0–34.0)
MCHC: 32.8 g/dL (ref 30.0–36.0)
MCV: 99.5 fL (ref 80.0–100.0)
Platelets: 230 10*3/uL (ref 150–400)
RBC: 3.86 MIL/uL — ABNORMAL LOW (ref 4.22–5.81)
RDW: 15.6 % — ABNORMAL HIGH (ref 11.5–15.5)
WBC: 17.6 10*3/uL — ABNORMAL HIGH (ref 4.0–10.5)
nRBC: 0 % (ref 0.0–0.2)

## 2021-03-31 LAB — I-STAT CHEM 8, ED
BUN: 28 mg/dL — ABNORMAL HIGH (ref 8–23)
Calcium, Ion: 1.11 mmol/L — ABNORMAL LOW (ref 1.15–1.40)
Chloride: 102 mmol/L (ref 98–111)
Creatinine, Ser: 2.1 mg/dL — ABNORMAL HIGH (ref 0.61–1.24)
Glucose, Bld: 120 mg/dL — ABNORMAL HIGH (ref 70–99)
HCT: 41 % (ref 39.0–52.0)
Hemoglobin: 13.9 g/dL (ref 13.0–17.0)
Potassium: 4.8 mmol/L (ref 3.5–5.1)
Sodium: 133 mmol/L — ABNORMAL LOW (ref 135–145)
TCO2: 22 mmol/L (ref 22–32)

## 2021-03-31 LAB — POCT I-STAT EG7
Acid-base deficit: 3 mmol/L — ABNORMAL HIGH (ref 0.0–2.0)
Acid-base deficit: 3 mmol/L — ABNORMAL HIGH (ref 0.0–2.0)
Bicarbonate: 22.4 mmol/L (ref 20.0–28.0)
Bicarbonate: 22.8 mmol/L (ref 20.0–28.0)
Calcium, Ion: 1.06 mmol/L — ABNORMAL LOW (ref 1.15–1.40)
Calcium, Ion: 1.2 mmol/L (ref 1.15–1.40)
HCT: 36 % — ABNORMAL LOW (ref 39.0–52.0)
HCT: 37 % — ABNORMAL LOW (ref 39.0–52.0)
Hemoglobin: 12.2 g/dL — ABNORMAL LOW (ref 13.0–17.0)
Hemoglobin: 12.6 g/dL — ABNORMAL LOW (ref 13.0–17.0)
O2 Saturation: 56 %
O2 Saturation: 57 %
Potassium: 4.6 mmol/L (ref 3.5–5.1)
Potassium: 4.9 mmol/L (ref 3.5–5.1)
Sodium: 135 mmol/L (ref 135–145)
Sodium: 137 mmol/L (ref 135–145)
TCO2: 24 mmol/L (ref 22–32)
TCO2: 24 mmol/L (ref 22–32)
pCO2, Ven: 42.1 mmHg — ABNORMAL LOW (ref 44.0–60.0)
pCO2, Ven: 43.4 mmHg — ABNORMAL LOW (ref 44.0–60.0)
pH, Ven: 7.329 (ref 7.250–7.430)
pH, Ven: 7.334 (ref 7.250–7.430)
pO2, Ven: 32 mmHg (ref 32.0–45.0)
pO2, Ven: 32 mmHg (ref 32.0–45.0)

## 2021-03-31 LAB — MRSA NEXT GEN BY PCR, NASAL: MRSA by PCR Next Gen: NOT DETECTED

## 2021-03-31 LAB — COMPREHENSIVE METABOLIC PANEL
ALT: 73 U/L — ABNORMAL HIGH (ref 0–44)
AST: 113 U/L — ABNORMAL HIGH (ref 15–41)
Albumin: 2.6 g/dL — ABNORMAL LOW (ref 3.5–5.0)
Alkaline Phosphatase: 234 U/L — ABNORMAL HIGH (ref 38–126)
Anion gap: 11 (ref 5–15)
BUN: 28 mg/dL — ABNORMAL HIGH (ref 8–23)
CO2: 22 mmol/L (ref 22–32)
Calcium: 8.9 mg/dL (ref 8.9–10.3)
Chloride: 101 mmol/L (ref 98–111)
Creatinine, Ser: 2.02 mg/dL — ABNORMAL HIGH (ref 0.61–1.24)
GFR, Estimated: 37 mL/min — ABNORMAL LOW (ref >60)
Glucose, Bld: 126 mg/dL — ABNORMAL HIGH (ref 70–99)
Potassium: 4.8 mmol/L (ref 3.5–5.1)
Sodium: 134 mmol/L — ABNORMAL LOW (ref 135–145)
Total Bilirubin: 1.4 mg/dL — ABNORMAL HIGH (ref 0.3–1.2)
Total Protein: 6.9 g/dL (ref 6.5–8.1)

## 2021-03-31 LAB — CBC WITH DIFFERENTIAL/PLATELET
Abs Immature Granulocytes: 0.1 10*3/uL — ABNORMAL HIGH (ref 0.00–0.07)
Basophils Absolute: 0 10*3/uL (ref 0.0–0.1)
Basophils Relative: 0 %
Eosinophils Absolute: 0 10*3/uL (ref 0.0–0.5)
Eosinophils Relative: 0 %
HCT: 40.9 % (ref 39.0–52.0)
Hemoglobin: 13.5 g/dL (ref 13.0–17.0)
Immature Granulocytes: 1 %
Lymphocytes Relative: 5 %
Lymphs Abs: 0.8 10*3/uL (ref 0.7–4.0)
MCH: 32.6 pg (ref 26.0–34.0)
MCHC: 33 g/dL (ref 30.0–36.0)
MCV: 98.8 fL (ref 80.0–100.0)
Monocytes Absolute: 1.6 10*3/uL — ABNORMAL HIGH (ref 0.1–1.0)
Monocytes Relative: 10 %
Neutro Abs: 12.8 10*3/uL — ABNORMAL HIGH (ref 1.7–7.7)
Neutrophils Relative %: 84 %
Platelets: 190 10*3/uL (ref 150–400)
RBC: 4.14 MIL/uL — ABNORMAL LOW (ref 4.22–5.81)
RDW: 15.2 % (ref 11.5–15.5)
WBC: 15.4 10*3/uL — ABNORMAL HIGH (ref 4.0–10.5)
nRBC: 0 % (ref 0.0–0.2)

## 2021-03-31 LAB — RESP PANEL BY RT-PCR (FLU A&B, COVID) ARPGX2
Influenza A by PCR: NEGATIVE
Influenza B by PCR: NEGATIVE
SARS Coronavirus 2 by RT PCR: NEGATIVE

## 2021-03-31 LAB — POCT ACTIVATED CLOTTING TIME
Activated Clotting Time: 781 seconds
Activated Clotting Time: 874 seconds

## 2021-03-31 LAB — TROPONIN I (HIGH SENSITIVITY)
Troponin I (High Sensitivity): 19869 ng/L (ref <18)
Troponin I (High Sensitivity): >24000 ng/L (ref <18)

## 2021-03-31 LAB — GLUCOSE, CAPILLARY: Glucose-Capillary: 116 mg/dL — ABNORMAL HIGH (ref 70–99)

## 2021-03-31 LAB — MAGNESIUM: Magnesium: 1.8 mg/dL (ref 1.7–2.4)

## 2021-03-31 SURGERY — CORONARY/GRAFT ACUTE MI REVASCULARIZATION
Anesthesia: LOCAL

## 2021-03-31 MED ORDER — TIROFIBAN (AGGRASTAT) BOLUS VIA INFUSION
INTRAVENOUS | Status: DC | PRN
  Administered 2021-03-31: 1587.5 ug via INTRAVENOUS

## 2021-03-31 MED ORDER — ASPIRIN EC 81 MG PO TBEC
81.0000 mg | DELAYED_RELEASE_TABLET | Freq: Every day | ORAL | Status: DC
  Administered 2021-04-01 – 2021-04-05 (×5): 81 mg via ORAL
  Filled 2021-03-31 (×5): qty 1

## 2021-03-31 MED ORDER — FENTANYL CITRATE (PF) 100 MCG/2ML IJ SOLN
INTRAMUSCULAR | Status: AC
  Filled 2021-03-31: qty 2

## 2021-03-31 MED ORDER — HYDROCODONE-ACETAMINOPHEN 10-325 MG PO TABS
1.0000 | ORAL_TABLET | ORAL | Status: DC | PRN
  Administered 2021-04-01 – 2021-04-05 (×15): 1 via ORAL
  Filled 2021-03-31 (×15): qty 1

## 2021-03-31 MED ORDER — TIROFIBAN HCL IN NACL 5-0.9 MG/100ML-% IV SOLN
INTRAVENOUS | Status: AC
  Filled 2021-03-31: qty 100

## 2021-03-31 MED ORDER — SODIUM CHLORIDE 0.9% FLUSH
10.0000 mL | Freq: Two times a day (BID) | INTRAVENOUS | Status: DC
Start: 2021-04-01 — End: 2021-04-07
  Administered 2021-04-01 – 2021-04-07 (×9): 10 mL

## 2021-03-31 MED ORDER — EPINEPHRINE 1 MG/10ML IJ SOSY
PREFILLED_SYRINGE | INTRAMUSCULAR | Status: DC | PRN
  Administered 2021-03-31: 1 mg via INTRAVENOUS

## 2021-03-31 MED ORDER — CLOPIDOGREL BISULFATE 300 MG PO TABS
ORAL_TABLET | ORAL | Status: DC | PRN
  Administered 2021-03-31: 600 mg via ORAL

## 2021-03-31 MED ORDER — HYDRALAZINE HCL 20 MG/ML IJ SOLN: 10.0000 mg | INTRAMUSCULAR | Status: AC | PRN

## 2021-03-31 MED ORDER — VERAPAMIL HCL 2.5 MG/ML IV SOLN
INTRAVENOUS | Status: AC
  Filled 2021-03-31: qty 2

## 2021-03-31 MED ORDER — HEPARIN (PORCINE) IN NACL 1000-0.9 UT/500ML-% IV SOLN
INTRAVENOUS | Status: DC | PRN
  Administered 2021-03-31 (×2): 500 mL

## 2021-03-31 MED ORDER — SODIUM CHLORIDE 0.9% FLUSH
3.0000 mL | INTRAVENOUS | Status: DC | PRN
  Administered 2021-04-01: 3 mL via INTRAVENOUS

## 2021-03-31 MED ORDER — HEPARIN SODIUM (PORCINE) 1000 UNIT/ML IJ SOLN
4000.0000 [IU] | Freq: Once | INTRAMUSCULAR | Status: AC
  Administered 2021-03-31: 4000 [IU] via INTRAVENOUS

## 2021-03-31 MED ORDER — SODIUM CHLORIDE 0.9 % IV BOLUS
1000.0000 mL | Freq: Once | INTRAVENOUS | Status: AC
  Administered 2021-03-31: 1000 mL via INTRAVENOUS

## 2021-03-31 MED ORDER — NOREPINEPHRINE 16 MG/250ML-% IV SOLN
0.0000 ug/min | INTRAVENOUS | Status: DC
Start: 2021-03-31 — End: 2021-03-31
  Filled 2021-03-31: qty 250

## 2021-03-31 MED ORDER — NOREPINEPHRINE 4 MG/250ML-% IV SOLN
0.0000 ug/min | INTRAVENOUS | Status: DC
  Administered 2021-03-31: 40 ug/min via INTRAVENOUS

## 2021-03-31 MED ORDER — CHLORHEXIDINE GLUCONATE CLOTH 2 % EX PADS
6.0000 | MEDICATED_PAD | Freq: Every day | CUTANEOUS | Status: DC
  Administered 2021-03-31 – 2021-04-06 (×7): 6 via TOPICAL

## 2021-03-31 MED ORDER — ORAL CARE MOUTH RINSE
15.0000 mL | Freq: Two times a day (BID) | OROMUCOSAL | Status: DC
  Administered 2021-04-01 – 2021-04-08 (×15): 15 mL via OROMUCOSAL

## 2021-03-31 MED ORDER — NOREPINEPHRINE 4 MG/250ML-% IV SOLN
INTRAVENOUS | Status: AC
  Filled 2021-03-31: qty 250

## 2021-03-31 MED ORDER — ATORVASTATIN CALCIUM 80 MG PO TABS
80.0000 mg | ORAL_TABLET | Freq: Every day | ORAL | Status: DC
  Administered 2021-04-01 – 2021-04-04 (×4): 80 mg via ORAL
  Filled 2021-03-31 (×4): qty 1

## 2021-03-31 MED ORDER — CLOPIDOGREL BISULFATE 300 MG PO TABS
ORAL_TABLET | ORAL | Status: AC
  Filled 2021-03-31: qty 2

## 2021-03-31 MED ORDER — ROCURONIUM BROMIDE 10 MG/ML (PF) SYRINGE
PREFILLED_SYRINGE | INTRAVENOUS | Status: AC
  Filled 2021-03-31: qty 10

## 2021-03-31 MED ORDER — CLOPIDOGREL BISULFATE 75 MG PO TABS
75.0000 mg | ORAL_TABLET | Freq: Every day | ORAL | Status: DC
  Administered 2021-04-01 – 2021-04-05 (×5): 75 mg via ORAL
  Filled 2021-03-31 (×6): qty 1

## 2021-03-31 MED ORDER — TIROFIBAN HCL IN NACL 5-0.9 MG/100ML-% IV SOLN: 0.0750 ug/kg/min | INTRAVENOUS | Status: AC

## 2021-03-31 MED ORDER — VERAPAMIL HCL 2.5 MG/ML IV SOLN
INTRAVENOUS | Status: DC | PRN
  Administered 2021-03-31: 10 mL via INTRA_ARTERIAL

## 2021-03-31 MED ORDER — SODIUM CHLORIDE 0.9 % IV SOLN
250.0000 mL | INTRAVENOUS | Status: DC | PRN
  Administered 2021-04-02: 250 mL via INTRAVENOUS

## 2021-03-31 MED ORDER — EPINEPHRINE 1 MG/10ML IJ SOSY
PREFILLED_SYRINGE | INTRAMUSCULAR | Status: AC
  Filled 2021-03-31: qty 10

## 2021-03-31 MED ORDER — SODIUM CHLORIDE 0.9% FLUSH
3.0000 mL | Freq: Two times a day (BID) | INTRAVENOUS | Status: DC
  Administered 2021-03-31 – 2021-04-01 (×2): 3 mL via INTRAVENOUS

## 2021-03-31 MED ORDER — IOHEXOL 350 MG/ML SOLN
INTRAVENOUS | Status: DC | PRN
Start: 2021-03-31 — End: 2021-03-31
  Administered 2021-03-31: 90 mL

## 2021-03-31 MED ORDER — HEPARIN (PORCINE) IN NACL 1000-0.9 UT/500ML-% IV SOLN
INTRAVENOUS | Status: AC
  Filled 2021-03-31: qty 1000

## 2021-03-31 MED ORDER — HEPARIN SODIUM (PORCINE) 1000 UNIT/ML IJ SOLN
INTRAMUSCULAR | Status: AC
  Filled 2021-03-31: qty 1

## 2021-03-31 MED ORDER — ONDANSETRON HCL 4 MG/2ML IJ SOLN
INTRAMUSCULAR | Status: DC | PRN
  Administered 2021-03-31: 8 mg via INTRAVENOUS

## 2021-03-31 MED ORDER — GABAPENTIN 600 MG PO TABS
300.0000 mg | ORAL_TABLET | Freq: Every day | ORAL | Status: DC
  Administered 2021-03-31 – 2021-04-05 (×6): 300 mg via ORAL
  Filled 2021-03-31 (×6): qty 1

## 2021-03-31 MED ORDER — NOREPINEPHRINE 4 MG/250ML-% IV SOLN
0.0000 ug/min | INTRAVENOUS | Status: DC
  Administered 2021-03-31: 24 ug/min via INTRAVENOUS
  Filled 2021-03-31 (×2): qty 250

## 2021-03-31 MED ORDER — HEPARIN SODIUM (PORCINE) 1000 UNIT/ML IJ SOLN
INTRAMUSCULAR | Status: DC | PRN
  Administered 2021-03-31: 10000 [IU] via INTRAVENOUS

## 2021-03-31 MED ORDER — SODIUM CHLORIDE 0.9% FLUSH: 10.0000 mL | INTRAVENOUS | Status: DC | PRN

## 2021-03-31 MED ORDER — ONDANSETRON HCL 4 MG/2ML IJ SOLN
INTRAMUSCULAR | Status: AC
  Filled 2021-03-31: qty 4

## 2021-03-31 MED ORDER — HEPARIN BOLUS VIA INFUSION: 4000.0000 [IU] | Freq: Once | INTRAVENOUS | Status: DC

## 2021-03-31 MED ORDER — LIDOCAINE HCL (PF) 1 % IJ SOLN
INTRAMUSCULAR | Status: AC
  Filled 2021-03-31: qty 30

## 2021-03-31 MED ORDER — MIDAZOLAM HCL 2 MG/2ML IJ SOLN
INTRAMUSCULAR | Status: AC
  Filled 2021-03-31: qty 2

## 2021-03-31 MED ORDER — TIROFIBAN HCL IN NACL 5-0.9 MG/100ML-% IV SOLN
INTRAVENOUS | Status: AC | PRN
  Administered 2021-03-31: 0.075 ug/kg/min via INTRAVENOUS

## 2021-03-31 MED ORDER — ONDANSETRON HCL 4 MG/2ML IJ SOLN: 4.0000 mg | Freq: Four times a day (QID) | INTRAMUSCULAR | Status: DC | PRN

## 2021-03-31 MED ORDER — PANTOPRAZOLE SODIUM 40 MG IV SOLR
40.0000 mg | INTRAVENOUS | Status: DC
  Administered 2021-03-31 – 2021-04-06 (×7): 40 mg via INTRAVENOUS
  Filled 2021-03-31 (×7): qty 40

## 2021-03-31 MED ORDER — NOREPINEPHRINE 4 MG/250ML-% IV SOLN
0.0000 ug/min | INTRAVENOUS | Status: DC
  Administered 2021-03-31: 40 ug/min via INTRAVENOUS
  Administered 2021-03-31: 2 ug/min via INTRAVENOUS
  Filled 2021-03-31: qty 250

## 2021-03-31 MED ORDER — ETOMIDATE 2 MG/ML IV SOLN
INTRAVENOUS | Status: AC
  Filled 2021-03-31: qty 20

## 2021-03-31 MED ORDER — SODIUM CHLORIDE 0.9 % IV SOLN: INTRAVENOUS | Status: AC

## 2021-03-31 MED ORDER — LABETALOL HCL 5 MG/ML IV SOLN: 10.0000 mg | INTRAVENOUS | Status: DC | PRN

## 2021-03-31 MED ORDER — LIDOCAINE HCL (PF) 1 % IJ SOLN
INTRAMUSCULAR | Status: DC | PRN
  Administered 2021-03-31: 20 mL
  Administered 2021-03-31: 2 mL

## 2021-03-31 SURGICAL SUPPLY — 27 items
BALL SAPPHIRE NC24 3.0X22 (BALLOONS) ×2
BALLN IABP SENSA PLUS 7.5F 40C (BALLOONS) ×2
BALLN SAPPHIRE 2.0X12 (BALLOONS) ×2
BALLOON IABP SENS PLUS 7.5F40C (BALLOONS) ×1 IMPLANT
BALLOON SAPPHIRE 2.0X12 (BALLOONS) ×1 IMPLANT
BALLOON SAPPHIRE NC24 3.0X22 (BALLOONS) ×1 IMPLANT
CATH 5FR JL3.5 JR4 ANG PIG MP (CATHETERS) ×2 IMPLANT
CATH PRIORITY ONE AC 6F (CATHETERS) ×2 IMPLANT
CATH SWAN GANZ VIP 7.5F (CATHETERS) ×2 IMPLANT
CATH VISTA GUIDE 6FR JR4 (CATHETERS) ×2 IMPLANT
GLIDESHEATH SLEND SS 6F .021 (SHEATH) ×2 IMPLANT
KIT ENCORE 26 ADVANTAGE (KITS) ×2 IMPLANT
KIT HEART LEFT (KITS) ×2 IMPLANT
PACK CARDIAC CATHETERIZATION (CUSTOM PROCEDURE TRAY) ×2 IMPLANT
SHEATH PINNACLE 7F 10CM (SHEATH) ×2 IMPLANT
SHEATH PINNACLE 8F 10CM (SHEATH) ×2 IMPLANT
STENT SYNERGY XD 2.75X12 (Permanent Stent) ×1 IMPLANT
STENT SYNERGY XD 2.75X28 (Permanent Stent) ×1 IMPLANT
STENT SYNERGY XD 2.75X32 (Permanent Stent) ×1 IMPLANT
SYNERGY XD 2.75X12 (Permanent Stent) ×2 IMPLANT
SYNERGY XD 2.75X28 (Permanent Stent) ×2 IMPLANT
SYNERGY XD 2.75X32 (Permanent Stent) ×2 IMPLANT
TRANSDUCER W/STOPCOCK (MISCELLANEOUS) ×2 IMPLANT
TUBING CIL FLEX 10 FLL-RA (TUBING) ×2 IMPLANT
WIRE COUGAR XT STRL 190CM (WIRE) ×2 IMPLANT
WIRE EMERALD 3MM-J .035X150CM (WIRE) ×2 IMPLANT
WIRE HI TORQ VERSACORE J 260CM (WIRE) ×2 IMPLANT

## 2021-03-31 NOTE — Progress Notes (Signed)
Responded to code blue in the cath lab. Patient with STEMI, recent CVA on pradaxa for Afib, lung cancer who had chest pain since 7AM, presented to [facility] with STEMI. Hypotensive with rapidly escalating pressor requirements. Code blue called for severe hypotension concerning for pre-arrest. Complaining of SOB and nausea. Zofran given, Norepi 61mcg running peripherally. PCI, femoral swan, IABP placed. Case completed, night team to continue caring for the patient. Going to [facility] ICU.  Julian Hy, DO 04/21/2021 7:34 PM [facility]

## 2021-03-31 NOTE — Consult Note (Addendum)
NAME:  Juan Wheeler, MRN:  [id number], DOB:  16-Aug-1961, LOS: 0 ADMISSION DATE:  04/03/2021, CONSULTATION DATE:  04/03/2021 REFERRING MD:  Dr. Angelena Form, CHIEF COMPLAINT:  Chest Pain    History of Present Illness:  60 y/o M who presented to [facility] ER on 7/4 with reports of chest pain.    At baseline, he lives with his wife and has a hx of tobacco abuse, colorectal adenocarcinoma, squamous cell lung cancer, DVT/PE on Pradaxa, PAF, CAD, ICM with LVEF 30-35% (12/2020).    The patient began having chest pain around 0700 and called EMS but initially refused transport.  He did not have relief from chest pain with NTG and his wife prompted him to go to the ER around 5pm.  EKG was concerning for inferior & anterolateral ST elevation.  He was hypotensive with SBP in 60-70's in the ER.  He was treated with IVF's, heparin infusion and levophed.  The patient was transferred emergently to the [facility] Cath Lab where it was identified he had an occlusion of the RCA which was opened.  IABP placed.  While in cath lab, he developed shortness of breath with concerns for need of intubation.  Labs notable for AKI on CKD, elevated LFT's, troponin 19,869, WBC 15.4, hgb 13.5, and platelets 190.  CXR with left chest port-a-cath in place, R pleural effusion and basilar atelectasis.  He remains on 40 mcg's levophed via PIV.   PCCM consulted for evaluation.   Pertinent  Medical History  Tobacco abuse Colorectal adenocarcinoma Squamous cell lung cancer DVT/PE on Pradaxa PAF CAD ICM with LVEF 30-35%.  PAD s/p Left Fem-pop bypass 2016  Significant Hospital Events: Including procedures, antibiotic start and stop dates in addition to other pertinent events   7/4 > admit.  Interim History / Subjective:  As above   Objective   Blood pressure (!) 88/71, pulse 94, resp. rate 19, height 5\' 9"  (1.753 m), weight 63.5 kg, SpO2 100 %.       No intake or output data in the 24 hours ending 04/07/2021 1847 Filed Weights    04/23/2021 1800  Weight: 63.5 kg    Examination: General: Adult male, on cath lab table, in NAD. Neuro: A&O x 3, no deficits. HEENT: [place]/AT. Sclerae anicteric. EOMI. Cardiovascular: RRR, no M/R/G.  Lungs: Respirations even and unlabored.  CTA bilaterally, No W/R/R. Abdomen: BS x 4, soft, NT/ND.  Musculoskeletal: No gross deformities, no edema.  Skin: Intact, warm, no rashes.   Resolved Hospital Problem list     Assessment & Plan:   Inferolateral STEMI - s/p RCA stent x 3. - Post op care per cardiology.  Cardiogenic shock - 2/2 above.  S/p balloon pump in cath lab. - Continue balloon pump per cards. - Continue Levophed as needed for goal MAP > 65.  Hx CAD, ICM, PAF, HTN. - As above. - Hold home Toprol-XL, Simvastatin.  Hx DVT/PE on Pradaxa. - Heparin gtt per pharmacy in lieu of home Pradaxa. - Hold home Pradaxa.  Hx CKD. - Supportive care. - Follow BMP.  Transaminitis - presumed 2/2 above. - Continue supportive care. - Trend LFT's.  Hx recent stroke. - Supportive care.  Hx Lung CA and Colorectal CA - s/p XRT, lap colostomy, concurrent chemoradiation and currently on Durvalumab (followed by Dr. Delton Coombes at [facility]). - F/u as outpatient.   Best Practice (right click and "Reselect all SmartList Selections" daily)   Diet/type: NPO - sips with meds DVT prophylaxis: systemic heparin GI  prophylaxis: N/A Lines: Central line Foley:  N/A Code Status:  full code Last date of multidisciplinary goals of care discussion: not yet  Labs   CBC: Recent Labs  Lab 04/17/2021 1632 04/14/2021 1652  WBC 15.4*  --   NEUTROABS 12.8*  --   HGB 13.5 13.9  HCT 40.9 41.0  MCV 98.8  --   PLT 190  --     Basic Metabolic Panel: Recent Labs  Lab 04/10/2021 1632 04/15/2021 1652  NA 134* 133*  K 4.8 4.8  CL 101 102  CO2 22  --   GLUCOSE 126* 120*  BUN 28* 28*  CREATININE 2.02* 2.10*  CALCIUM 8.9  --    GFR: Estimated Creatinine Clearance: 33.2 mL/min (A) (by C-G  formula based on SCr of 2.1 mg/dL (H)). Recent Labs  Lab 04/07/2021 1632  WBC 15.4*    Liver Function Tests: Recent Labs  Lab 04/12/2021 1632  AST 113*  ALT 73*  ALKPHOS 234*  BILITOT 1.4*  PROT 6.9  ALBUMIN 2.6*   No results for input(s): LIPASE, AMYLASE in the last 168 hours. No results for input(s): AMMONIA in the last 168 hours.  ABG    Component Value Date/Time   PHART 7.285 (L) 06/05/2016 1706   PCO2ART 47.8 06/05/2016 1706   PO2ART 161.0 (H) 06/05/2016 1706   HCO3 22.7 06/05/2016 1706   TCO2 22 03/30/2021 1652   ACIDBASEDEF 4.0 (H) 06/05/2016 1706   O2SAT 99.0 06/05/2016 1706     Coagulation Profile: No results for input(s): INR, PROTIME in the last 168 hours.  Cardiac Enzymes: No results for input(s): CKTOTAL, CKMB, CKMBINDEX, TROPONINI in the last 168 hours.  HbA1C: Hgb A1c MFr Bld  Date/Time Value Ref Range Status  02/16/2021 04:45 AM 6.8 (H) 4.8 - 5.6 % Final    Comment:    (NOTE) Pre diabetes:          5.7%-6.4%  Diabetes:              >6.4%  Glycemic control for   <7.0% adults with diabetes   06/13/2016 05:40 AM 5.9 (H) 4.8 - 5.6 % Final    Comment:    (NOTE)         Pre-diabetes: 5.7 - 6.4         Diabetes: >6.4         Glycemic control for adults with diabetes: <7.0     CBG: No results for input(s): GLUCAP in the last 168 hours.  Review of Systems:   All negative; except for those that are bolded, which indicate positives.  Constitutional: weight loss, weight gain, night sweats, fevers, chills, fatigue, weakness.  HEENT: headaches, sore throat, sneezing, nasal congestion, post nasal drip, difficulty swallowing, tooth/dental problems, visual complaints, visual changes, ear aches. Neuro: difficulty with speech, weakness, numbness, ataxia. CV:  chest pain, orthopnea, PND, swelling in lower extremities, dizziness, palpitations, syncope.  Resp: cough, hemoptysis, dyspnea, wheezing. GI: heartburn, indigestion, abdominal pain, nausea,  vomiting, diarrhea, constipation, change in bowel habits, loss of appetite, hematemesis, melena, hematochezia.  GU: dysuria, change in color of urine, urgency or frequency, flank pain, hematuria. MSK: joint pain or swelling, decreased range of motion. Psych: change in mood or affect, depression, anxiety, suicidal ideations, homicidal ideations. Skin: rash, itching, bruising.   Past Medical History:  He,  has a past medical history of Chronic lower back pain, Colon cancer ([place]), Coronary artery disease, DVT (deep venous thrombosis) ([place]) (~ 2013), Dysrhythmia, GERD (gastroesophageal reflux disease), H/O necrotising  fasciitis, History of blood transfusion, Hypertension, LV dysfunction, PAD (peripheral artery disease) ([place]), Port-A-Cath in place (05/30/2020), Pulmonary nodules (09/30/2014), Rectal cancer ([place]), and Tobacco abuse.   Surgical History:   Past Surgical History:  Procedure Laterality Date   ABDOMINAL SURGERY  1990's   'for stomach ulcers" (04/17/2013)   COLECTOMY  2012   "for rectal cancer" (04/17/2013)   COLONOSCOPY  2013   Dr. Cheryll Cockayne: colorectal anastomosis with ulcer and inflammation, benign biopsy   COLONOSCOPY WITH PROPOFOL N/A 09/07/2016   Procedure: COLONOSCOPY WITH PROPOFOL;  Surgeon: Daneil Dolin, MD;  [facility];  Service: Endoscopy;  Laterality: N/A;  10:00 am Colonoscopy via rectum and ostomy   COLOSTOMY TAKEDOWN  2013   CORONARY ANGIOPLASTY WITH STENT PLACEMENT  04/17/2013   "?1" (04/17/2013)   FEMORAL-POPLITEAL BYPASS GRAFT Left 07/02/2015   Procedure: BYPASS GRAFT LEFT COMMON FEMORAL ARTERY TO LEFT ABOVE KNEE POPLITEAL ARTERY - USING LEFT GREATER SAPPHENOUS VEIN;  Surgeon: Elam Dutch, MD;  Location: [place];  Service: Vascular;  Laterality: Left;   INCISION AND DRAINAGE ABSCESS Left 06/05/2016   Procedure: INCISION AND DRAINAGE ABSCESS;  Surgeon: Aviva Signs, MD;  Location: [facility];  Service: General;  Laterality: Left;   INCISION AND DRAINAGE  PERIRECTAL ABSCESS Left 06/07/2016   Procedure: IRRIGATION AND DEBRIDEMENT LEFT BUTTOCK ABSCESS;  Surgeon: Greer Pickerel, MD;  Location: [place];  Service: General;  Laterality: Left;   INGUINAL HERNIA REPAIR Bilateral 1990's   LAPAROSCOPIC PARTIAL COLECTOMY N/A 06/11/2016   Procedure: LAPAROSCOPIC  OPEN COLOSTOMY;  Surgeon: [name], MD;  Location: [place];  Service: General;  Laterality: N/A;   LEFT HEART CATHETERIZATION WITH CORONARY ANGIOGRAM N/A 04/17/2013   Procedure: LEFT HEART CATHETERIZATION WITH CORONARY ANGIOGRAM;  Surgeon: Peter M Martinique, MD;  Location: [facility] CATH LAB;  Service: Cardiovascular;  Laterality: N/A;   PERCUTANEOUS STENT INTERVENTION  04/17/2013   Procedure: PERCUTANEOUS STENT INTERVENTION;  Surgeon: Peter M Martinique, MD;  Location: [facility] CATH LAB;  Service: Cardiovascular;;   PERIPHERAL VASCULAR CATHETERIZATION N/A 06/14/2015   Procedure: Abdominal Aortogram;  Surgeon: Elam Dutch, MD;  Location: [place] CV LAB;  Service: Cardiovascular;  Laterality: N/A;   PERIPHERAL VASCULAR CATHETERIZATION Bilateral 06/14/2015   Procedure: Lower Extremity Angiography;  Surgeon: Elam Dutch, MD;  Location: [place] CV LAB;  Service: Cardiovascular;  Laterality: Bilateral;   PORTACATH PLACEMENT Left 06/05/2020   Procedure: INSERTION PORT-A-CATH;  Surgeon: Aviva Signs, MD;  Location: [facility];  Service: General;  Laterality: Left;   VEIN HARVEST Left 07/02/2015   Procedure: [place];  Surgeon: Elam Dutch, MD;  Location: [place];  Service: Vascular;  Laterality: Left;     Social History:   reports that he has been smoking cigarettes. He started smoking about 47 years ago. He has a 10.00 pack-year smoking history. He has never used smokeless tobacco. He reports current alcohol use of about 3.0 standard drinks of alcohol per week. He reports current drug use. Drug: Marijuana.   Family History:  His family history includes Bleeding Disorder in his  brother; Cancer in his mother; Hypertension in his mother.   Allergies Allergies  Allergen Reactions   Tramadol Other (See Comments)    Felt sluggish and ineffective    Darvocet [Propoxyphene N-Acetaminophen] Palpitations     Home Medications  Prior to Admission medications   Medication Sig Start Date End Date Taking? Authorizing Provider  aspirin EC 81 MG tablet Take 81 mg  by mouth daily.    [provider]  dabigatran (PRADAXA) 150 MG CAPS capsule Take 1 capsule (150 mg total) by mouth every 12 (twelve) hours. 03/11/21   Angiulli, Lavon Paganini, [name]  DURVALUMAB IV Inject into the vein every 14 (fourteen) days. 09/12/20   [provider]  gabapentin (NEURONTIN) 600 MG tablet Take 0.5 tablets (300 mg total) by mouth at bedtime. 03/11/21   Angiulli, Lavon Paganini, [name]  HYDROcodone-acetaminophen (NORCO) 10-325 MG tablet Take 1 tablet by mouth every 4 (four) hours as needed. 03/11/21   Angiulli, Lavon Paganini, [name]  metoprolol succinate (TOPROL-XL) 50 MG 24 hr tablet Take 1 tablet (50 mg total) by mouth daily. Take with or immediately following a meal. 03/11/21   Angiulli, Lavon Paganini, [name]  nitroGLYCERIN (NITROSTAT) 0.4 MG SL tablet Place 1 tablet (0.4 mg total) under the tongue every 5 (five) minutes as needed for chest pain. 03/11/21   Angiulli, Lavon Paganini, [name]  omeprazole (PRILOSEC) 40 MG capsule Take 1 capsule (40 mg total) by mouth daily. 03/11/21   Angiulli, Lavon Paganini, [name]  simvastatin (ZOCOR) 40 MG tablet Take 1 tablet (40 mg total) by mouth daily at 6 PM. 03/11/21   Angiulli, Lavon Paganini, [name]  prochlorperazine (COMPAZINE) 10 MG tablet Take 1 tablet (10 mg total) by mouth every 6 (six) hours as needed (Nausea or vomiting). 06/26/20 08/16/20  [name], MD     Critical care time: 40 min.    Montey Hora, [place] [facility] For pager details, please see AMION or use Epic chat  After 1900, please call [facility] for cross coverage needs 03/28/2021, 7:22  PM    Pulmonary critical care attending:  This is a 60 year old gentleman, past medical history of tobacco abuse, colorectal adenocarcinoma, squamous cell carcinoma of the lung, history of DVT PE on Pradaxa, PAF, CAD, ischemic cardiomyopathy with an LVEF of 30 to 35% in April 2022.  Patient called this morning EMS around 7 AM.  Had chest pain but refused transport.  Eventually wife prompted him to go to the emergency room around 5 PM this evening.  EKG was concerning for inferior and anterior lateral ST elevation.  Patient was hypotensive and transferred to [facility] for further evaluation by cardiology.  He was brought to the Cath Lab.  He had full occlusion of the RCA which was opened by interventional cardiology.  Unfortunately remained need for support and intra-aortic balloon pump was placed.  Pulmonary critical care was consulted for ICU management and transferred from Cath Lab to the ICU.  BP 96/62   Pulse 84   Temp 99.14 F (37.3 C)   Resp 11   Ht 5\' 9"  (1.753 m)   Wt 63.5 kg   SpO2 99%   BMI 20.67 kg/m   General: Elderly male resting comfortably in bed, was seen initially in the Cath Lab as well as postoperative in the ICU. HEENT: Muscle wasting present, tracking appropriately, heart: Regular rate rhythm S1-S2 Lungs: Clear to auscultation Abdomen: Soft nontender nondistended Extremities: Cool, pulses are palpable Labs: Reviewed  Assessment: Cardiogenic shock Acute RCA ST segment elevation MI History of ischemic cardiomyopathy History of colorectal cancer History of squamous cell lung cancer History of DVT PE on Pradaxa prior to admission  Plan: Pharmacy consulted for heparin Pending ATC checks  On antiplatelet per cards  Wean IABP as able  Remains on NEPI to maintain MAP >15mmHg  Titrate as tolerated   This patient is critically ill  with multiple organ system failure; which, requires frequent high complexity decision making, assessment, support, evaluation, and  titration of therapies. This was completed through the application of advanced monitoring technologies and extensive interpretation of multiple databases. During this encounter critical care time was devoted to patient care services described in this note for 50 minutes.  Garner Nash, DO [place] [facility] 04/18/2021 9:50 PM

## 2021-03-31 NOTE — Discharge Instructions (Addendum)
Patient to be transferred to the [facility] cardiac Cath Lab Dr. Angelena Form except

## 2021-03-31 NOTE — Progress Notes (Signed)
eLink Physician-Brief Progress Note Patient Name: Juan Wheeler DOB: [birth year] MRN: [id number]   Date of Service  03/29/2021  HPI/Events of Note  16 M HFrEF 30%, afib on Pradaxa, CAD, PAD, CVA, lung and colon cancer ongoing therapy, hypertension, presented with chest pain. Hypotensive on presentation. EKG with STEMI. LHC showed RCA occlusion s/p stent.  eICU Interventions  Cardiogenic shock on IABP and norepinephrine STEMI s/p PCI to RCA on tirofiban        Grey Rakestraw T Torrey Ballinas 04/20/2021, 9:24 PM

## 2021-03-31 NOTE — Progress Notes (Addendum)
ANTICOAGULATION CONSULT NOTE - Initial Consult  Pharmacy Consult for heparin Indication:  IABP  Allergies  Allergen Reactions   Tramadol Other (See Comments)    Felt sluggish and ineffective    Darvocet [Propoxyphene N-Acetaminophen] Palpitations    Patient Measurements: Height: 5\' 9"  (175.3 cm) Weight: 63.5 kg (140 lb) IBW/kg (Calculated) : 70.7 Heparin Dosing Weight: 63.5 kg   Vital Signs: Temp: 99.14 F (37.3 C) (07/04 2027) BP: 96/62 (07/04 1953) Pulse Rate: 84 (07/04 1953)  Labs: Recent Labs    04/06/2021 1632 04/06/2021 1652 04/01/2021 1925 04/19/2021 1928  HGB 13.5 13.9 11.9*  12.2* 12.6*  HCT 40.9 41.0 35.0*  36.0* 37.0*  PLT 190  --   --   --   CREATININE 2.02* 2.10*  --   --   TROPONINIHS 73,710*  --   --   --     Estimated Creatinine Clearance: 33.2 mL/min (A) (by C-G formula based on SCr of 2.1 mg/dL (H)).   Medical History: Past Medical History:  Diagnosis Date   Anticoagulation adequate 03/30/2021   Cardiomyopathy ([place]) 04/07/2021   Chronic lower back pain    a. Followed [facility] management at [facility].   Colon cancer [facility])    rectal cancer   Coronary artery disease    a. 03/2013: abnl nuc -> LHC s/p DES to LCx, residual moderate disease in LAD (med rx unless refractory angina). b. Not on BB due to bradycardia.   DVT (deep venous thrombosis) ([place]) ~ 2013   Dysrhythmia    AFib   GERD (gastroesophageal reflux disease)    H/O necrotising fasciitis    History of blood transfusion    "once; after throwing up alot of blood" (04/17/2013)   Hypertension    LV dysfunction    a. EF 45% in 03/2013.   PAD (peripheral artery disease) ([place])    a. Occlusion of the right internal iliac artery, with significant atherosclerosis in the left internal iliac which was not amenable to reconstruction per notes from [place] in place 05/30/2020   Pulmonary nodules 09/30/2014   Rectal cancer ([facility])    STEMI (ST elevation myocardial infarction) ([place]) 04/25/2021    STEMI involving oth coronary artery of inferior wall ([place]) 04/06/2021   Tobacco abuse     Medications:  Scheduled:   [START ON 04/01/2021] aspirin EC  81 mg Oral Daily   [START ON 04/01/2021] atorvastatin  80 mg Oral Daily   [START ON 04/01/2021] clopidogrel  75 mg Oral Q breakfast   gabapentin  300 mg Oral QHS   pantoprazole (PROTONIX) IV  40 mg Intravenous Q24H   sodium chloride flush  3 mL Intravenous Q12H    Assessment: 67 yom presenting with code STEMI. On pradaxa PTA for hx Afib and bilateral PE/DVT - LD 7/4@0900 .   Hgb 12.6, plt 190, trop 19869. Cardiac cath finding prox-mid RCA occlusion now s/p DES. Was hypotensive in cath found to be in cardiogenic shock with IABP placed - now at 1:1. No s/sx of bleeding. Plan for aggrastat for 2 hours post-cath.   ACT elevated at 874 at end of cath - discussed with team and will start systemic heparin for IABP once ACT<180.   Goal of Therapy:  Heparin level 0.3-0.5 units/ml Monitor platelets by anticoagulation protocol: Yes   Plan:  Monitor ACT q1 hr and will get heparin level in 8 hours after cath Start heparin at 700 units/hr once ACT<180 Order heparin level 6-8 hours after restart Monitor  daily HL, CBC, and for s/sx of bleeding  Antonietta Jewel, PharmD, [place] Pharmacist  Phone: [phone] 04/20/2021 9:34 PM  Please check AMION for all [place] phone numbers After 10:00 PM, call [place] [phone]

## 2021-03-31 NOTE — H&P (Addendum)
Cardiology Admission History and Physical:   Patient ID: Juan Wheeler MRN: [id number]; DOB: [birth year]   Admission date: 04/15/2021  PCP:  [name], MD   [place] Prov[facility] Cardiologist: Former Jacinta Shoe ([place])  Chief Complaint:  Chest pain  History of Present Illness:   [name] is a 60 yo male with history of HTN, chronic kidney disease, colorectal adenocarcinoma, lung cancer (squamous cell carcinoma), tobacco abuse, DVT, bilateral PE on Pradaxa, paroxysmal atrial fibrillation, CAD with prior stenting of the Circumflex artery in 2014, ischemic cardiomyopathy with LVEF=30-35% in April 2022, PAD with prior left fem-pop bypass in 2016 and stroke May 2022 who presented to the [facility] ED with c/o chest pain around 4:45 pm. His chest pain began around 7 am but he did not seek medical attention. He has been having pain all day. No relief with NTG. EKG in ED with inferior and anterolateral ST elevation. Pt with ongoing chest pain. Code STEMI activated by ED staff. Systolic BP 94-70 in the ED. He was given a fluid bolus, heparin bolus and started on Levophed.   Upon arrival to [facility] the patient is having ongoing chest pain.    Past Medical History:  Diagnosis Date   Chronic lower back pain    a. Followed by pain management at [facility].   Colon cancer [facility])    rectal cancer   Coronary artery disease    a. 03/2013: abnl nuc -> LHC s/p DES to LCx, residual moderate disease in LAD (med rx unless refractory angina). b. Not on BB due to bradycardia.   DVT (deep venous thrombosis) ([place]) ~ 2013   Dysrhythmia    AFib   GERD (gastroesophageal reflux disease)    H/O necrotising fasciitis    History of blood transfusion    "once; after throwing up alot of blood" (04/17/2013)   Hypertension    LV dysfunction    a. EF 45% in 03/2013.   PAD (peripheral artery disease) ([place])    a. Occlusion of the right internal iliac artery, with significant atherosclerosis  in the left internal iliac which was not amenable to reconstruction per notes from [place] in place 05/30/2020   Pulmonary nodules 09/30/2014   Rectal cancer ([place])    Tobacco abuse     Past Surgical History:  Procedure Laterality Date   ABDOMINAL SURGERY  1990's   'for stomach ulcers" (04/17/2013)   COLECTOMY  2012   "for rectal cancer" (04/17/2013)   COLONOSCOPY  2013   Dr. Cheryll Cockayne: colorectal anastomosis with ulcer and inflammation, benign biopsy   COLONOSCOPY WITH PROPOFOL N/A 09/07/2016   Procedure: COLONOSCOPY WITH PROPOFOL;  Surgeon: Daneil Dolin, MD;  [facility];  Service: Endoscopy;  Laterality: N/A;  10:00 am Colonoscopy via rectum and ostomy   COLOSTOMY TAKEDOWN  2013   CORONARY ANGIOPLASTY WITH STENT PLACEMENT  04/17/2013   "?1" (04/17/2013)   FEMORAL-POPLITEAL BYPASS GRAFT Left 07/02/2015   Procedure: BYPASS GRAFT LEFT COMMON FEMORAL ARTERY TO LEFT ABOVE KNEE POPLITEAL ARTERY - USING LEFT GREATER SAPPHENOUS VEIN;  Surgeon: Elam Dutch, MD;  Location: [place];  Service: Vascular;  Laterality: Left;   INCISION AND DRAINAGE ABSCESS Left 06/05/2016   Procedure: INCISION AND DRAINAGE ABSCESS;  Surgeon: Aviva Signs, MD;  Location: [facility] ORS;  Service: General;  Laterality: Left;   INCISION AND DRAINAGE PERIRECTAL ABSCESS Left 06/07/2016   Procedure: IRRIGATION AND DEBRIDEMENT LEFT BUTTOCK ABSCESS;  Surgeon: Greer Pickerel, MD;  Location: [facility] OR;  Service: General;  Laterality: Left;   INGUINAL HERNIA REPAIR Bilateral 1990's   LAPAROSCOPIC PARTIAL COLECTOMY N/A 06/11/2016   Procedure: LAPAROSCOPIC  OPEN COLOSTOMY;  Surgeon: [name], MD;  Location: [place];  Service: General;  Laterality: N/A;   LEFT HEART CATHETERIZATION WITH CORONARY ANGIOGRAM N/A 04/17/2013   Procedure: LEFT HEART CATHETERIZATION WITH CORONARY ANGIOGRAM;  Surgeon: Peter M Martinique, MD;  Location: [facility] CATH LAB;  Service: Cardiovascular;  Laterality: N/A;   PERCUTANEOUS STENT  INTERVENTION  04/17/2013   Procedure: PERCUTANEOUS STENT INTERVENTION;  Surgeon: Peter M Martinique, MD;  Location: [facility] CATH LAB;  Service: Cardiovascular;;   PERIPHERAL VASCULAR CATHETERIZATION N/A 06/14/2015   Procedure: Abdominal Aortogram;  Surgeon: Elam Dutch, MD;  Location: [place] CV LAB;  Service: Cardiovascular;  Laterality: N/A;   PERIPHERAL VASCULAR CATHETERIZATION Bilateral 06/14/2015   Procedure: Lower Extremity Angiography;  Surgeon: Elam Dutch, MD;  Location: [place] CV LAB;  Service: Cardiovascular;  Laterality: Bilateral;   PORTACATH PLACEMENT Left 06/05/2020   Procedure: INSERTION PORT-A-CATH;  Surgeon: Aviva Signs, MD;  Location: [facility];  Service: General;  Laterality: Left;   VEIN HARVEST Left 07/02/2015   Procedure: [place];  Surgeon: Elam Dutch, MD;  Location: [place];  Service: Vascular;  Laterality: Left;     Medications Prior to Admission: Prior to Admission medications   Medication Sig Start Date End Date Taking? Authorizing Provider  aspirin EC 81 MG tablet Take 81 mg by mouth daily.    [provider]  dabigatran (PRADAXA) 150 MG CAPS capsule Take 1 capsule (150 mg total) by mouth every 12 (twelve) hours. 03/11/21   Angiulli, Lavon Paganini, [name]  DURVALUMAB IV Inject into the vein every 14 (fourteen) days. 09/12/20   [provider]  gabapentin (NEURONTIN) 600 MG tablet Take 0.5 tablets (300 mg total) by mouth at bedtime. 03/11/21   Angiulli, Lavon Paganini, [name]  HYDROcodone-acetaminophen (NORCO) 10-325 MG tablet Take 1 tablet by mouth every 4 (four) hours as needed. 03/11/21   Angiulli, Lavon Paganini, [name]  metoprolol succinate (TOPROL-XL) 50 MG 24 hr tablet Take 1 tablet (50 mg total) by mouth daily. Take with or immediately following a meal. 03/11/21   Angiulli, Lavon Paganini, [name]  nitroGLYCERIN (NITROSTAT) 0.4 MG SL tablet Place 1 tablet (0.4 mg total) under the tongue every 5 (five) minutes as needed for chest pain.  03/11/21   Angiulli, Lavon Paganini, [name]  omeprazole (PRILOSEC) 40 MG capsule Take 1 capsule (40 mg total) by mouth daily. 03/11/21   Angiulli, Lavon Paganini, [name]  simvastatin (ZOCOR) 40 MG tablet Take 1 tablet (40 mg total) by mouth daily at 6 PM. 03/11/21   Angiulli, Lavon Paganini, [name]  prochlorperazine (COMPAZINE) 10 MG tablet Take 1 tablet (10 mg total) by mouth every 6 (six) hours as needed (Nausea or vomiting). 06/26/20 08/16/20  [name], MD     Allergies:    Allergies  Allergen Reactions   Tramadol Other (See Comments)    Felt sluggish and ineffective    Darvocet [Propoxyphene N-Acetaminophen] Palpitations    Social History:   Social History   Socioeconomic History   Marital status: Married    Spouse name: Not on file   Number of children: Not on file   Years of education: Not on file   Highest education level: Not on file  Occupational History   Not on file  Tobacco Use   Smoking status: Light Smoker  Packs/day: 0.25    Years: 40.00    Pack years: 10.00    Types: Cigarettes    Start date: 03/14/1974   Smokeless tobacco: Never   Tobacco comments:    5-6 per day 06/12/15  Vaping Use   Vaping Use: Never used  Substance and Sexual Activity   Alcohol use: Yes    Alcohol/week: 3.0 standard drinks    Types: 3 Cans of beer per week   Drug use: Yes    Types: Marijuana    Comment: 2 days ago    Sexual activity: Yes    Partners: Male    Birth control/protection: None  Other Topics Concern   Not on file  Social History Narrative   Not on file   Social Determinants of Health   Financial Resource Strain: Low Risk    Difficulty of Paying Living Expenses: Not hard at all  Food Insecurity: No Food Insecurity   Worried About Charity fundraiser in the Last Year: Never true   [place] in the Last Year: Never true  Transportation Needs: No Transportation Needs   Lack of Transportation (Medical): No   Lack of Transportation (Non-Medical): No  Physical Activity:  Inactive   Days of Exercise per Week: 0 days   Minutes of Exercise per Session: 0 min  Stress: Stress Concern Present   Feeling of Stress : To some extent  Social Connections: Moderately Integrated   Frequency of Communication with Friends and Family: Three times a week   Frequency of Social Gatherings with Friends and Family: Three times a week   Attends Religious Services: 1 to 4 times per year   Active Member of Clubs or Organizations: No   Attends Archivist Meetings: Never   Marital Status: Married  Human resources officer Violence: Not At Risk   Fear of Current or Ex-Partner: No   Emotionally Abused: No   Physically Abused: No   Sexually Abused: No    Family History:   The patient's family history includes Bleeding Disorder in his brother; Cancer in his mother; Hypertension in his mother.    ROS:  Please see the history of present illness.  All other ROS reviewed and negative.     Physical Exam/Data:   Vitals:   04/07/2021 1706 04/10/2021 1709 04/24/2021 1710 03/29/2021 1715  BP: (!) 69/58 (!) 81/50 (!) 70/53 (!) 88/71  Pulse:      Resp: 18 16 20 19   SpO2:       No intake or output data in the 24 hours ending 04/24/2021 1801 Last 3 Weights 02/21/2021 02/15/2021 02/10/2021  Weight (lbs) 140 lb 10.5 oz 157 lb 147 lb 6.4 oz  Weight (kg) 63.8 kg 71.215 kg 66.86 kg     There is no height or weight on file to calculate BMI.  General:  Thin, cachectic male, appears uncomfortable HEENT: normal Lymph: no adenopathy Neck: no JVD Endocrine:  No thryomegaly Cardiac:  normal S1, S2; RRR; no murmur  Lungs:  clear to auscultation bilaterally, + wheezes.   Abd: soft, nontender Ext: no LE edema Musculoskeletal:  No deformities, BUE and BLE strength normal and equal Skin: warm and dry  Neuro:  CNs 2-12 intact, no focal abnormalities noted Psych:  Normal affect   EKG:  The ECG that was done was personally reviewed and demonstrates atrial fib with RVR, inferior and anterolateral ST  elevation  Relevant CV Studies:   Laboratory Data:  High Sensitivity Troponin:   Recent Labs  Lab 04/27/2021 1632  TROPONINIHS 19,869*      Chemistry Recent Labs  Lab 03/28/2021 1632 04/02/2021 1652  NA 134* 133*  K 4.8 4.8  CL 101 102  CO2 22  --   GLUCOSE 126* 120*  BUN 28* 28*  CREATININE 2.02* 2.10*  CALCIUM 8.9  --   GFRNONAA 37*  --   ANIONGAP 11  --     Recent Labs  Lab 04/21/2021 1632  PROT 6.9  ALBUMIN 2.6*  AST 113*  ALT 73*  ALKPHOS 234*  BILITOT 1.4*   Hematology Recent Labs  Lab 04/06/2021 1632 04/07/2021 1652  WBC 15.4*  --   RBC 4.14*  --   HGB 13.5 13.9  HCT 40.9 41.0  MCV 98.8  --   MCH 32.6  --   MCHC 33.0  --   RDW 15.2  --   PLT 190  --    BNPNo results for input(s): BNP, PROBNP in the last 168 hours.  DDimer No results for input(s): DDIMER in the last 168 hours.   Radiology/Studies:  DG Chest Port 1 View  Result Date: 04/16/2021 CLINICAL DATA:  Shortness of breath. EXAM: PORTABLE CHEST 1 VIEW COMPARISON:  06/05/2020 FINDINGS: Moderate right pleural effusion with right mid and lower lung collapse/consolidative opacity. No focal consolidation in the left lung. Interstitial markings are diffusely coarsened with chronic features. Left Port-A-Cath noted. Telemetry leads overlie the chest. IMPRESSION: Moderate right pleural effusion with right mid and lower lung collapse/consolidative opacity. Electronically Signed   By: Misty Stanley M.D.   On: 04/06/2021 17:33     Assessment and Plan:   Acute inferolateral STEMI: Will plan emergent cardiac cath. Further plans to follow after cath.  2.   Ischemic cardiomyopathy 3.   H/O DVT/PE on Pradaxa 4.   Recent stroke 5.   PAF 6.   Lung cancer 7.   Colorectal cancer 8.   Acute on chronic renal failure  Addendum Post Cath:  RCA was occluded. PCI of the RCA performed with 3 overlapping drug eluting stents. Cardiogenic shock with IABP in place. SWAN in right femoral vein. Levophed 40. I spoke to his  wife and communicated that his prognosis is guarded currently but stable.   Echo tomorrow. ASA/Plavix/high intensity statin Aggrastat for 2 hours. Plavix was loaded in the cath lab.  Radial sheath pulled in the cath lab. TR band in place.  I have asked pharmacy to start IV heparin for the balloon pump when ACT less than 180.  He is at very high risk for bleeding.     Risk Assessment/Risk Scores:    TIMI Risk Score for ST  Elevation MI:   The patient's TIMI risk score is 7 which indicates a 23.4% risk of all cause mortality at 30 days.     CHA2DS2-VASc Score = 4  This indicates a 4% annual risk of stroke. The patient's score is based upon: CHF History: No HTN History: Yes Diabetes History: No Stroke History: Yes Vascular Disease History: Yes Age Score: 0 Gender Score: 0     Severity of Illness: The appropriate patient status for this patient is INPATIENT. Inpatient status is judged to be reasonable and necessary in order to provide the required intensity of service to ensure the patient's safety. The patient's presenting symptoms, physical exam findings, and initial radiographic and laboratory data in the context of their chronic comorbidities is felt to place them at high risk for further clinical deterioration. Furthermore, it is not anticipated that  the patient will be medically stable for discharge from the hospital within 2 midnights of admission. The following factors support the patient status of inpatient.   " The patient's presenting symptoms include chest pain " The worrisome physical exam findings include  " The initial radiographic and laboratory data are worrisome because of inferior ST elevation " The chronic co-morbidities include see above   * I certify that at the point of admission it is my clinical judgment that the patient will require inpatient hospital care spanning beyond 2 midnights from the point of admission due to high intensity of service, high risk  for further deterioration and high frequency of surveillance required.*   For questions or updates, please contact [place] Please consult [email or web address] for contact info under     Signed, Lauree Chandler, MD  04/06/2021 6:01 PM

## 2021-03-31 NOTE — ED Provider Notes (Signed)
[facility] EMERGENCY DEPARTMENT Provider Note   CSN: [id number] Arrival date & time: 04/13/2021  1603     History No chief complaint on file.   Juan Wheeler is a 60 y.o. male.  Patient states she has been having chest pain severe since 7 AM.  He took some nitro that helped with the pain and it has returned around lunchtime.  Patient has a history of coronary disease with a stent and also has a history of colon cancer and lung cancer.   Chest Pain Pain location:  L chest Pain radiates to:  Precordial region Pain severity:  Moderate Onset quality:  Sudden Duration: 10 hours. Timing:  Constant Progression:  Worsening Chronicity:  Recurrent Context: not breathing   Relieved by:  Nothing Worsened by:  Nothing Associated symptoms: no abdominal pain, no back pain, no cough, no fatigue and no headache       Past Medical History:  Diagnosis Date   Chronic lower back pain    a. Followed by pain management at [facility].   Colon cancer [facility])    rectal cancer   Coronary artery disease    a. 03/2013: abnl nuc -> LHC s/p DES to LCx, residual moderate disease in LAD (med rx unless refractory angina). b. Not on BB due to bradycardia.   DVT (deep venous thrombosis) ([place]) ~ 2013   Dysrhythmia    AFib   GERD (gastroesophageal reflux disease)    H/O necrotising fasciitis    History of blood transfusion    "once; after throwing up alot of blood" (04/17/2013)   Hypertension    LV dysfunction    a. EF 45% in 03/2013.   PAD (peripheral artery disease) ([place])    a. Occlusion of the right internal iliac artery, with significant atherosclerosis in the left internal iliac which was not amenable to reconstruction per notes from [place] in place 05/30/2020   Pulmonary nodules 09/30/2014   Rectal cancer ([place])    Tobacco abuse     Patient Active Problem List   Diagnosis Date Noted   Ischemic cerebrovascular accident (CVA) of frontal lobe ([place]) 02/21/2021   Acute cerebral  infarction ([place]) 02/21/2021   Thrombocytopenia ([place])    Dyslipidemia    Embolic infarction (HCC)    Squamous cell carcinoma of lung (HCC)    Rectal adenocarcinoma (HCC)    Chronic combined systolic and diastolic CHF (congestive heart failure) (HCC)    Bilateral pulmonary embolism (HCC)    Atrial fibrillation (HCC)    Marijuana abuse    TIA (transient ischemic attack) 02/15/2021   Hypoalbuminemia due to protein-calorie malnutrition ([place]) 01/16/2021   Elevated MCV 01/16/2021   Atrial fibrillation with rapid ventricular response (HCC)    Malignant neoplasm of lung (HCC)    Pulmonary embolism ([place]) 01/15/2021   Hepatic metastasis ([place]) 01/15/2021   Elevated troponin I level 01/15/2021   Pleural effusion on right 01/15/2021   Neutropenia, drug-induced ([place]) 07/25/2020   Port-A-Cath in place 05/30/2020   Squamous cell lung cancer, right ([place]) 05/22/2020   History of DVT (deep vein thrombosis) 07/08/2016   Paroxysmal atrial fibrillation ([place]) 07/08/2016   SOB (shortness of breath) 07/08/2016   Sepsis secondary to UTI ([place]) 07/08/2016   Hypokalemia    Gastroesophageal reflux disease    Coronary artery disease involving native coronary artery of native heart with angina pectoris ([place])    AKI (acute kidney injury) ([place])    Fournier's gangrene in male 06/05/2016   Abscess  06/05/2016   Sepsis ([place])    Necrotizing fasciitis ([place])    Central venous catheter in place    Acute respiratory failure ([place])    PAD (peripheral artery disease) ([place]) 07/02/2015   Preoperative cardiovascular examination 06/12/2015   Cardiomyopathy, ischemic 06/12/2015   Pulmonary nodules 09/30/2014   ASCVD (arteriosclerotic cardiovascular disease) 05/02/2013   Unstable angina ([place]) 04/18/2013   Chest pain 03/29/2013   Tobacco abuse 03/29/2013   Chronic pain syndrome 11/09/2012   Pain in joint, pelvic region and thigh 11/09/2012   Neuralgia and neuritis 10/12/2012   Atherosclerosis of native arteries of  extremity with intermittent claudication ([place]) 08/19/2012   Backache 03/24/2012   Peripheral vascular disease ([place]) 12/04/2011   Compression of vein 12/04/2011   Heartburn 12/03/2011   Personal history of digestive disease 11/19/2011   Depressive disorder 09/24/2011   Dysuria 08/31/2011   Impotence of organic origin 08/31/2011   Urinary frequency 08/31/2011   Constipation 06/05/2011   Essential hypertension 06/05/2011   Rectal cancer metastasized to lung ([place]) 12/02/2010    Past Surgical History:  Procedure Laterality Date   ABDOMINAL SURGERY  1990's   'for stomach ulcers" (04/17/2013)   COLECTOMY  2012   "for rectal cancer" (04/17/2013)   COLONOSCOPY  2013   Dr. Cheryll Cockayne: colorectal anastomosis with ulcer and inflammation, benign biopsy   COLONOSCOPY WITH PROPOFOL N/A 09/07/2016   Procedure: COLONOSCOPY WITH PROPOFOL;  Surgeon: Daneil Dolin, MD;  [facility];  Service: Endoscopy;  Laterality: N/A;  10:00 am Colonoscopy via rectum and ostomy   COLOSTOMY TAKEDOWN  2013   CORONARY ANGIOPLASTY WITH STENT PLACEMENT  04/17/2013   "?1" (04/17/2013)   FEMORAL-POPLITEAL BYPASS GRAFT Left 07/02/2015   Procedure: BYPASS GRAFT LEFT COMMON FEMORAL ARTERY TO LEFT ABOVE KNEE POPLITEAL ARTERY - USING LEFT GREATER SAPPHENOUS VEIN;  Surgeon: Elam Dutch, MD;  Location: [place];  Service: Vascular;  Laterality: Left;   INCISION AND DRAINAGE ABSCESS Left 06/05/2016   Procedure: INCISION AND DRAINAGE ABSCESS;  Surgeon: Aviva Signs, MD;  Location: [facility] ORS;  Service: General;  Laterality: Left;   INCISION AND DRAINAGE PERIRECTAL ABSCESS Left 06/07/2016   Procedure: IRRIGATION AND DEBRIDEMENT LEFT BUTTOCK ABSCESS;  Surgeon: Greer Pickerel, MD;  Location: [place];  Service: General;  Laterality: Left;   INGUINAL HERNIA REPAIR Bilateral 1990's   LAPAROSCOPIC PARTIAL COLECTOMY N/A 06/11/2016   Procedure: LAPAROSCOPIC  OPEN COLOSTOMY;  Surgeon: [name], MD;  Location: [place];  Service:  General;  Laterality: N/A;   LEFT HEART CATHETERIZATION WITH CORONARY ANGIOGRAM N/A 04/17/2013   Procedure: LEFT HEART CATHETERIZATION WITH CORONARY ANGIOGRAM;  Surgeon: Peter M Martinique, MD;  Location: [facility] CATH LAB;  Service: Cardiovascular;  Laterality: N/A;   PERCUTANEOUS STENT INTERVENTION  04/17/2013   Procedure: PERCUTANEOUS STENT INTERVENTION;  Surgeon: Peter M Martinique, MD;  Location: [facility] CATH LAB;  Service: Cardiovascular;;   PERIPHERAL VASCULAR CATHETERIZATION N/A 06/14/2015   Procedure: Abdominal Aortogram;  Surgeon: Elam Dutch, MD;  Location: [place] CV LAB;  Service: Cardiovascular;  Laterality: N/A;   PERIPHERAL VASCULAR CATHETERIZATION Bilateral 06/14/2015   Procedure: Lower Extremity Angiography;  Surgeon: Elam Dutch, MD;  Location: [place] CV LAB;  Service: Cardiovascular;  Laterality: Bilateral;   PORTACATH PLACEMENT Left 06/05/2020   Procedure: INSERTION PORT-A-CATH;  Surgeon: Aviva Signs, MD;  Location: [facility];  Service: General;  Laterality: Left;   VEIN HARVEST Left 07/02/2015   Procedure: [place];  Surgeon: Jessy Oto  Fields, MD;  Location: [place];  Service: Vascular;  Laterality: Left;       Family History  Problem Relation Age of Onset   Cancer Mother    Hypertension Mother    Bleeding Disorder Brother     Social History   Tobacco Use   Smoking status: Light Smoker    Packs/day: 0.25    Years: 40.00    Pack years: 10.00    Types: Cigarettes    Start date: 03/14/1974   Smokeless tobacco: Never   Tobacco comments:    5-6 per day 06/12/15  Vaping Use   Vaping Use: Never used  Substance Use Topics   Alcohol use: Yes    Alcohol/week: 3.0 standard drinks    Types: 3 Cans of beer per week   Drug use: Yes    Types: Marijuana    Comment: 2 days ago     Home Medications Prior to Admission medications   Medication Sig Start Date End Date Taking? Authorizing Provider  aspirin EC 81 MG tablet Take 81 mg by mouth  daily.    [provider]  dabigatran (PRADAXA) 150 MG CAPS capsule Take 1 capsule (150 mg total) by mouth every 12 (twelve) hours. 03/11/21   Angiulli, Lavon Paganini, [name]  DURVALUMAB IV Inject into the vein every 14 (fourteen) days. 09/12/20   [provider]  gabapentin (NEURONTIN) 600 MG tablet Take 0.5 tablets (300 mg total) by mouth at bedtime. 03/11/21   Angiulli, Lavon Paganini, [name]  HYDROcodone-acetaminophen (NORCO) 10-325 MG tablet Take 1 tablet by mouth every 4 (four) hours as needed. 03/11/21   Angiulli, Lavon Paganini, [name]  metoprolol succinate (TOPROL-XL) 50 MG 24 hr tablet Take 1 tablet (50 mg total) by mouth daily. Take with or immediately following a meal. 03/11/21   Angiulli, Lavon Paganini, [name]  nitroGLYCERIN (NITROSTAT) 0.4 MG SL tablet Place 1 tablet (0.4 mg total) under the tongue every 5 (five) minutes as needed for chest pain. 03/11/21   Angiulli, Lavon Paganini, [name]  omeprazole (PRILOSEC) 40 MG capsule Take 1 capsule (40 mg total) by mouth daily. 03/11/21   Angiulli, Lavon Paganini, [name]  simvastatin (ZOCOR) 40 MG tablet Take 1 tablet (40 mg total) by mouth daily at 6 PM. 03/11/21   Angiulli, Lavon Paganini, [name]  prochlorperazine (COMPAZINE) 10 MG tablet Take 1 tablet (10 mg total) by mouth every 6 (six) hours as needed (Nausea or vomiting). 06/26/20 08/16/20  [name], MD    Allergies    Tramadol and Darvocet [propoxyphene n-acetaminophen]  Review of Systems   Review of Systems  Constitutional:  Negative for appetite change and fatigue.  HENT:  Negative for congestion, ear discharge and sinus pressure.   Eyes:  Negative for discharge.  Respiratory:  Negative for cough.   Cardiovascular:  Positive for chest pain.  Gastrointestinal:  Negative for abdominal pain and diarrhea.  Genitourinary:  Negative for frequency and hematuria.  Musculoskeletal:  Negative for back pain.  Skin:  Negative for rash.  Neurological:  Negative for seizures and headaches.  Psychiatric/Behavioral:   Negative for hallucinations.    Physical Exam Updated Vital Signs There were no vitals taken for this visit.  Physical Exam Vitals and nursing note reviewed.  Constitutional:      Appearance: He is well-developed.  HENT:     Head: Normocephalic.     Mouth/Throat:     Mouth: Mucous membranes are moist.  Eyes:     General: No scleral icterus.    Conjunctiva/sclera:  Conjunctivae normal.  Neck:     Thyroid: No thyromegaly.  Cardiovascular:     Rate and Rhythm: Normal rate and regular rhythm.     Heart sounds: No murmur heard.   No friction rub. No gallop.  Pulmonary:     Breath sounds: No stridor. No wheezing or rales.  Chest:     Chest wall: No tenderness.  Abdominal:     General: There is no distension.     Tenderness: There is no abdominal tenderness. There is no rebound.  Musculoskeletal:        General: Normal range of motion.     Cervical back: Neck supple.  Lymphadenopathy:     Cervical: No cervical adenopathy.  Skin:    Findings: No erythema or rash.  Neurological:     Mental Status: He is alert and oriented to person, place, and time.     Motor: No abnormal muscle tone.     Coordination: Coordination normal.  Psychiatric:        Behavior: Behavior normal.    ED Results / Procedures / Treatments   Labs (all labs ordered are listed, but only abnormal results are displayed) Labs Reviewed  RESP PANEL BY RT-PCR (FLU A&B, COVID) ARPGX2  CBC WITH DIFFERENTIAL/PLATELET  COMPREHENSIVE METABOLIC PANEL  OCCULT BLOOD X 1 CARD TO LAB, STOOL  I-STAT CHEM 8, ED  TROPONIN I (HIGH SENSITIVITY)    EKG EKG Interpretation  Date/Time:  Monday March 31 2021 16:27:14 EDT Ventricular Rate:  117 PR Interval:    QRS Duration: 104 QT Interval:  325 QTC Calculation: 850 R Axis:   60 Text Interpretation: Atrial fibrillation RSR' in V1 or V2, right VCD or RVH Inferior infarct, acute (LCx) Lateral leads are also involved >>> Acute MI <<< Confirmed by Milton Ferguson [phone]) on  04/16/2021 4:33:05 PM  Radiology No results found.  Procedures Procedures   Medications Ordered in ED Medications  sodium chloride 0.9 % bolus 1,000 mL (has no administration in time range)  sodium chloride 0.9 % bolus 1,000 mL (has no administration in time range)  heparin bolus via infusion 4,000 Units (has no administration in time range)    ED Course  I have reviewed the triage vital signs and the nursing notes.  Pertinent labs & imaging results that were available during my care of the patient were reviewed by me and considered in my medical decision making (see chart for details).   CRITICAL CARE Performed by: Milton Ferguson Total critical care time: 45 minutes Critical care time was exclusive of separately billable procedures and treating other patients. Critical care was necessary to treat or prevent imminent or life-threatening deterioration. Critical care was time spent personally by me on the following activities: development of treatment plan with patient and/or surrogate as well as nursing, discussions with consultants, evaluation of patient's response to treatment, examination of patient, obtaining history from patient or surrogate, ordering and performing treatments and interventions, ordering and review of laboratory studies, ordering and review of radiographic studies, pulse oximetry and re-evaluation of patient's condition. Patient with EKG changes suggesting inferior STEMI.  I spoke with Dr. Angelena Form cardiology and he agrees with calling the patient a STEMI and transfer him to the Cath Lab at [facility].  The patient is hypotensive with pressure around 28-78 systolic.  He did not respond to 500 cc of saline so we are starting him on some Levophed.  He will be transferred immediately to [facility] MDM Rules/Calculators/A&P  STEMI with hypotension Final Clinical Impression(s) / ED Diagnoses Final diagnoses:  None    Rx / DC Orders ED Discharge Orders      None        Milton Ferguson, MD 04/24/2021 1705

## 2021-04-01 ENCOUNTER — Encounter (HOSPITAL_COMMUNITY): Payer: Self-pay | Admitting: Cardiovascular Disease

## 2021-04-01 ENCOUNTER — Inpatient Hospital Stay (HOSPITAL_COMMUNITY): Payer: Medicare PPO

## 2021-04-01 DIAGNOSIS — I2510 Atherosclerotic heart disease of native coronary artery without angina pectoris: Secondary | ICD-10-CM

## 2021-04-01 DIAGNOSIS — R570 Cardiogenic shock: Secondary | ICD-10-CM

## 2021-04-01 DIAGNOSIS — Z7901 Long term (current) use of anticoagulants: Secondary | ICD-10-CM

## 2021-04-01 DIAGNOSIS — I2119 ST elevation (STEMI) myocardial infarction involving other coronary artery of inferior wall: Secondary | ICD-10-CM | POA: Diagnosis not present

## 2021-04-01 DIAGNOSIS — I213 ST elevation (STEMI) myocardial infarction of unspecified site: Secondary | ICD-10-CM | POA: Diagnosis not present

## 2021-04-01 LAB — CBC
HCT: 35.8 % — ABNORMAL LOW (ref 39.0–52.0)
Hemoglobin: 11.7 g/dL — ABNORMAL LOW (ref 13.0–17.0)
MCH: 32.5 pg (ref 26.0–34.0)
MCHC: 32.7 g/dL (ref 30.0–36.0)
MCV: 99.4 fL (ref 80.0–100.0)
Platelets: 211 10*3/uL (ref 150–400)
RBC: 3.6 MIL/uL — ABNORMAL LOW (ref 4.22–5.81)
RDW: 15.3 % (ref 11.5–15.5)
WBC: 15.5 10*3/uL — ABNORMAL HIGH (ref 4.0–10.5)
nRBC: 0 % (ref 0.0–0.2)

## 2021-04-01 LAB — BASIC METABOLIC PANEL
Anion gap: 10 (ref 5–15)
BUN: 28 mg/dL — ABNORMAL HIGH (ref 8–23)
CO2: 19 mmol/L — ABNORMAL LOW (ref 22–32)
Calcium: 8.3 mg/dL — ABNORMAL LOW (ref 8.9–10.3)
Chloride: 103 mmol/L (ref 98–111)
Creatinine, Ser: 1.75 mg/dL — ABNORMAL HIGH (ref 0.61–1.24)
GFR, Estimated: 44 mL/min — ABNORMAL LOW (ref >60)
Glucose, Bld: 190 mg/dL — ABNORMAL HIGH (ref 70–99)
Potassium: 4.6 mmol/L (ref 3.5–5.1)
Sodium: 132 mmol/L — ABNORMAL LOW (ref 135–145)

## 2021-04-01 LAB — LACTIC ACID, PLASMA
Lactic Acid, Venous: 3 mmol/L (ref 0.5–1.9)
Lactic Acid, Venous: 3.1 mmol/L (ref 0.5–1.9)

## 2021-04-01 LAB — POCT ACTIVATED CLOTTING TIME
Activated Clotting Time: 254 seconds
Activated Clotting Time: 254 seconds
Activated Clotting Time: 248 seconds
Activated Clotting Time: 231 seconds
Activated Clotting Time: 219 seconds
Activated Clotting Time: 213 seconds
Activated Clotting Time: 219 seconds
Activated Clotting Time: 213 seconds
Activated Clotting Time: 213 seconds
Activated Clotting Time: 208 seconds
Activated Clotting Time: 208 seconds
Activated Clotting Time: 208 seconds
Activated Clotting Time: 202 seconds
Activated Clotting Time: 207 seconds
Activated Clotting Time: 190 seconds
Activated Clotting Time: 196 seconds
Activated Clotting Time: 196 seconds

## 2021-04-01 LAB — COOXEMETRY PANEL
Carboxyhemoglobin: 0.9 % (ref 0.5–1.5)
Carboxyhemoglobin: 0.8 % (ref 0.5–1.5)
Carboxyhemoglobin: 0.7 % (ref 0.5–1.5)
Methemoglobin: 0.8 % (ref 0.0–1.5)
Methemoglobin: 0.6 % (ref 0.0–1.5)
Methemoglobin: 0.6 % (ref 0.0–1.5)
O2 Saturation: 46.3 %
O2 Saturation: 50.2 %
O2 Saturation: 59.7 %
Total hemoglobin: 12.8 g/dL (ref 12.0–16.0)
Total hemoglobin: 14.6 g/dL (ref 12.0–16.0)
Total hemoglobin: 19.4 g/dL — ABNORMAL HIGH (ref 12.0–16.0)

## 2021-04-01 LAB — ECHOCARDIOGRAM COMPLETE
Area-P 1/2: 4.49 cm2
Calc EF: 45.9 %
Height: 69 in
S' Lateral: 3.4 cm
Single Plane A2C EF: 43 %
Single Plane A4C EF: 46.6 %
Weight: 2208.13 oz

## 2021-04-01 LAB — GLUCOSE, CAPILLARY
Glucose-Capillary: 137 mg/dL — ABNORMAL HIGH (ref 70–99)
Glucose-Capillary: 140 mg/dL — ABNORMAL HIGH (ref 70–99)
Glucose-Capillary: 149 mg/dL — ABNORMAL HIGH (ref 70–99)
Glucose-Capillary: 164 mg/dL — ABNORMAL HIGH (ref 70–99)
Glucose-Capillary: 133 mg/dL — ABNORMAL HIGH (ref 70–99)

## 2021-04-01 LAB — MAGNESIUM: Magnesium: 1.9 mg/dL (ref 1.7–2.4)

## 2021-04-01 LAB — HEPARIN LEVEL (UNFRACTIONATED): Heparin Unfractionated: <0.1 IU/mL — ABNORMAL LOW (ref 0.30–0.70)

## 2021-04-01 MED ORDER — MILRINONE LACTATE IN DEXTROSE 20-5 MG/100ML-% IV SOLN
0.2500 ug/kg/min | INTRAVENOUS | Status: DC
  Administered 2021-04-01: 0.25 ug/kg/min via INTRAVENOUS
  Filled 2021-04-01: qty 200

## 2021-04-01 MED ORDER — LACTATED RINGERS IV SOLN: INTRAVENOUS | Status: DC

## 2021-04-01 MED ORDER — NOREPINEPHRINE 16 MG/250ML-% IV SOLN
0.0000 ug/min | INTRAVENOUS | Status: DC
  Administered 2021-04-01: 25 ug/min via INTRAVENOUS
  Administered 2021-04-01: 18 ug/min via INTRAVENOUS
  Administered 2021-04-01: 4 ug/min via INTRAVENOUS
  Administered 2021-04-02: 10 ug/min via INTRAVENOUS
  Administered 2021-04-03: 12 ug/min via INTRAVENOUS
  Administered 2021-04-04: 16 ug/min via INTRAVENOUS
  Filled 2021-04-01 (×7): qty 250

## 2021-04-01 MED ORDER — NITROPRUSSIDE SODIUM-NACL 20-0.9 MG/100ML-% IV SOLN: 0.1000 ug/kg/min | INTRAVENOUS | Status: DC

## 2021-04-01 MED ORDER — HEPARIN (PORCINE) 25000 UT/250ML-% IV SOLN
850.0000 [IU]/h | INTRAVENOUS | Status: DC
  Administered 2021-04-01: 550 [IU]/h via INTRAVENOUS
  Administered 2021-04-02: 700 [IU]/h via INTRAVENOUS
  Administered 2021-04-03: 850 [IU]/h via INTRAVENOUS
  Filled 2021-04-01 (×2): qty 250

## 2021-04-01 MED ORDER — ENSURE ENLIVE PO LIQD
237.0000 mL | Freq: Two times a day (BID) | ORAL | Status: DC
  Administered 2021-04-02 – 2021-04-05 (×5): 237 mL via ORAL

## 2021-04-01 MED ORDER — SODIUM CHLORIDE 0.9% IV SOLUTION: INTRAVENOUS | Status: DC | PRN

## 2021-04-01 MED ORDER — LACTATED RINGERS IV BOLUS
500.0000 mL | Freq: Once | INTRAVENOUS | Status: AC
  Administered 2021-04-01: 500 mL via INTRAVENOUS

## 2021-04-01 MED ORDER — VASOPRESSIN 20 UNITS/100 ML INFUSION FOR SHOCK
0.0000 [IU]/min | INTRAVENOUS | Status: DC
  Administered 2021-04-01 – 2021-04-02 (×4): 0.04 [IU]/min via INTRAVENOUS
  Administered 2021-04-02 – 2021-04-03 (×2): 0.03 [IU]/min via INTRAVENOUS
  Filled 2021-04-01 (×6): qty 100

## 2021-04-01 MED ORDER — MILRINONE LACTATE IN DEXTROSE 20-5 MG/100ML-% IV SOLN
0.3750 ug/kg/min | INTRAVENOUS | Status: DC
  Administered 2021-04-01 – 2021-04-03 (×6): 0.375 ug/kg/min via INTRAVENOUS
  Filled 2021-04-01 (×5): qty 100

## 2021-04-01 MED ORDER — ZINC OXIDE 12.8 % EX OINT
TOPICAL_OINTMENT | Freq: Every day | CUTANEOUS | Status: DC
  Filled 2021-04-01 (×2): qty 56.7

## 2021-04-01 MED ORDER — ALBUMIN HUMAN 5 % IV SOLN
12.5000 g | Freq: Once | INTRAVENOUS | Status: AC
  Administered 2021-04-01: 12.5 g via INTRAVENOUS
  Filled 2021-04-01: qty 250

## 2021-04-01 MED ORDER — LIP MEDEX EX OINT
TOPICAL_OINTMENT | CUTANEOUS | Status: DC | PRN
  Filled 2021-04-01: qty 7

## 2021-04-01 MED ORDER — ADULT MULTIVITAMIN W/MINERALS CH
1.0000 | ORAL_TABLET | Freq: Every day | ORAL | Status: DC
  Administered 2021-04-01 – 2021-04-04 (×4): 1 via ORAL
  Filled 2021-04-01 (×4): qty 1

## 2021-04-01 MED ORDER — INSULIN ASPART 100 UNIT/ML IJ SOLN
2.0000 [IU] | INTRAMUSCULAR | Status: DC
  Administered 2021-04-01: 4 [IU] via SUBCUTANEOUS
  Administered 2021-04-01 – 2021-04-02 (×5): 2 [IU] via SUBCUTANEOUS
  Administered 2021-04-02: 4 [IU] via SUBCUTANEOUS
  Administered 2021-04-02: 2 [IU] via SUBCUTANEOUS
  Administered 2021-04-03: 4 [IU] via SUBCUTANEOUS
  Administered 2021-04-03 – 2021-04-04 (×3): 2 [IU] via SUBCUTANEOUS
  Administered 2021-04-04: 4 [IU] via SUBCUTANEOUS

## 2021-04-01 NOTE — Progress Notes (Signed)
Initial Nutrition Assessment  DOCUMENTATION CODES:   Severe malnutrition in context of chronic illness  INTERVENTION:   Downgrade diet to DYS 3 given poor dentition   Ensure Enlive po BID, each supplement provides 350 kcal and 20 grams of protein Magic cup TID with meals, each supplement provides 290 kcal and 9 grams of protein MVI with minerals   NUTRITION DIAGNOSIS:   Severe Malnutrition related to chronic illness, cancer and cancer related treatments as evidenced by severe fat depletion, severe muscle depletion, percent weight loss.   GOAL:   Patient will meet greater than or equal to 90% of their needs  MONITOR:   PO intake, Supplement acceptance, Weight trends, Labs, Skin, I & O's  REASON FOR ASSESSMENT:   Rounds    ASSESSMENT:   Patient with PMH significant for ischemic cardiomyopathy, CAD, DVT, GERD, rectal adenocarcinoma, squamous cell lung cancer III (chemotherapy 07/2020), and CVA in 02/2021. Presents this admission with RCA STEMI.  7/4- s/p LHC, PCI to RCA, IABP placed   Patient tired upon RD visit. Unable to provide detailed diet history. Per RN, patient's brother states patient had a difficult time eating PTA. Not hungry this admission. Wears top dentures and is waiting for wife to bring them in tomorrow. Will downgrade diet to DYS 3 for comfort and add supplementation to maximize kcal and protein this admission.   Patient unable to state UBW but does endorse an unknown amount of weight loss. Records indicate patient weighed 74.3 kg on 09/25/20 and 62.6 kg this admission (15.7% wt loss in 7 months, significant for time frame).   UOP: 590 ml x 24 hrs  Colostomy: 200 ml x 24 hrs   Drips: LR @ 75 ml/hr, levophed, vasopressin  Medications: SS novolog Labs: Na 132 (L) CBG 137-140  NUTRITION - FOCUSED PHYSICAL EXAM:  Flowsheet Row Most Recent Value  Orbital Region Severe depletion  Upper Arm Region Moderate depletion  Thoracic and Lumbar Region Unable to  assess  Buccal Region Severe depletion  Temple Region Severe depletion  Clavicle Bone Region Severe depletion  Clavicle and Acromion Bone Region Severe depletion  Scapular Bone Region Unable to assess  Dorsal Hand Severe depletion  Patellar Region Severe depletion  Anterior Thigh Region Severe depletion  Posterior Calf Region Severe depletion  Edema (RD Assessment) Mild  Hair Reviewed  Eyes Reviewed  Mouth Reviewed  Skin Reviewed  Nails Reviewed      Diet Order:   Diet Order             Diet Heart Room service appropriate? Yes; Fluid consistency: Thin  Diet effective now                   EDUCATION NEEDS:   Not appropriate for education at this time  Skin:  Skin Assessment: Skin Integrity Issues: Skin Integrity Issues:: Other (Comment) Other: L buttocks wound  Last BM:  7/5  Height:   Ht Readings from Last 1 Encounters:  04/21/2021 5\' 9"  (1.753 m)    Weight:   Wt Readings from Last 1 Encounters:  04/01/21 62.6 kg    BMI:  Body mass index is 20.38 kg/m.  Estimated Nutritional Needs:   Kcal:  2000-2200 kcal  Protein:  100-120 grams  Fluid:  >/= 2 L/day  Mariana Single MS, RD, LDN, CNSC Clinical Nutrition Pager listed in [place]

## 2021-04-01 NOTE — Progress Notes (Signed)
Progress Note  Patient Name: Juan Wheeler Date of Encounter: 04/01/2021  [facility] Cardiologist: Kate Sable, MD (Inactive)   Subjective   Denies chest pain or shortness of breath.  Appears ill.  Inpatient Medications    Scheduled Meds:  aspirin EC  81 mg Oral Daily   atorvastatin  80 mg Oral Daily   Chlorhexidine Gluconate Cloth  6 each Topical Daily   clopidogrel  75 mg Oral Q breakfast   gabapentin  300 mg Oral QHS   mouth rinse  15 mL Mouth Rinse BID   pantoprazole (PROTONIX) IV  40 mg Intravenous Q24H   sodium chloride flush  10-40 mL Intracatheter Q12H   sodium chloride flush  3 mL Intravenous Q12H   Continuous Infusions:  sodium chloride     milrinone 0.375 mcg/kg/min (04/01/21 0700)   norepinephrine (LEVOPHED) Adult infusion 34 mcg/min (04/01/21 0700)   vasopressin 0.04 Units/min (04/01/21 0700)   PRN Meds: sodium chloride, sodium chloride, HYDROcodone-acetaminophen, ondansetron (ZOFRAN) IV, sodium chloride flush, sodium chloride flush   Vital Signs    Vitals:   04/01/21 0630 04/01/21 0642 04/01/21 0645 04/01/21 0700  BP:    121/75  Pulse: 84  74 77  Resp: 12 (!) 7 (!) 8 (!) 7  Temp: 98.2 F (36.8 C) 98.24 F (36.8 C) 98.42 F (36.9 C) 98.24 F (36.8 C)  TempSrc:      SpO2: 100%  93% 93%  Weight:      Height:        Intake/Output Summary (Last 24 hours) at 04/01/2021 0755 Last data filed at 04/01/2021 0700 Gross per 24 hour  Intake 1624.7 ml  Output 790 ml  Net 834.7 ml   Last 3 Weights 04/01/2021 04/12/2021 02/21/2021  Weight (lbs) 138 lb 0.1 oz 140 lb 140 lb 10.5 oz  Weight (kg) 62.6 kg 63.504 kg 63.8 kg      Telemetry    Atrial fibrillation with controlled ventricular rate, few PVCs - Personally Reviewed  ECG    Atrial fibrillation with controlled rate of 71 bpm, occasional PVC - Personally Reviewed  Physical Exam  Awake, answers questions appropriately, ill-appearing, thin GEN: No acute distress.   Neck: No JVD Cardiac:  Irregularly irregular, no murmurs, rubs, or gallops.  Respiratory: Clear to auscultation bilaterally. GI: Soft, diffusely tender with no rebound or guarding, non-distended  MS: No edema; No deformity.  Right femoral site is clear with intra-aortic balloon pump and Swan-Ganz catheter sites in the right femoral artery and vein, respectively Neuro:  Nonfocal  Psych: Flat affect   Labs    High Sensitivity Troponin:   Recent Labs  Lab 04/18/2021 1632 04/07/2021 2155  TROPONINIHS 19,869* >24,000*      Chemistry Recent Labs  Lab 04/07/2021 1632 04/07/2021 1652 04/01/2021 1925 03/28/2021 1928 04/03/2021 2155 04/01/21 0320  NA 134* 133*   < > 137 130* 132*  K 4.8 4.8   < > 4.6 4.5 4.6  CL 101 102  --   --  100 103  CO2 22  --   --   --  20* 19*  GLUCOSE 126* 120*  --   --  220* 190*  BUN 28* 28*  --   --  28* 28*  CREATININE 2.02* 2.10*  --   --  2.04* 1.75*  CALCIUM 8.9  --   --   --  8.1* 8.3*  PROT 6.9  --   --   --   --   --  ALBUMIN 2.6*  --   --   --   --   --   AST 113*  --   --   --   --   --   ALT 73*  --   --   --   --   --   ALKPHOS 234*  --   --   --   --   --   BILITOT 1.4*  --   --   --   --   --   GFRNONAA 37*  --   --   --  36* 44*  ANIONGAP 11  --   --   --  10 10   < > = values in this interval not displayed.     Hematology Recent Labs  Lab 04/02/2021 1632 04/19/2021 1652 04/17/2021 1928 04/12/2021 2155 04/01/21 0320  WBC 15.4*  --   --  17.6* 15.5*  RBC 4.14*  --   --  3.86* 3.60*  HGB 13.5   < > 12.6* 12.6* 11.7*  HCT 40.9   < > 37.0* 38.4* 35.8*  MCV 98.8  --   --  99.5 99.4  MCH 32.6  --   --  32.6 32.5  MCHC 33.0  --   --  32.8 32.7  RDW 15.2  --   --  15.6* 15.3  PLT 190  --   --  230 211   < > = values in this interval not displayed.    BNPNo results for input(s): BNP, PROBNP in the last 168 hours.   DDimer No results for input(s): DDIMER in the last 168 hours.   Radiology    CARDIAC CATHETERIZATION  Result Date: 03/28/2021  Prox RCA to Mid RCA  lesion is 100% stenosed.  Previously placed Prox Cx to Mid Cx stent (unknown type) is widely patent.  Dist LAD lesion is 70% stenosed.  Mid LAD lesion is 40% stenosed.  A drug-eluting stent was successfully placed using a SYNERGY XD 2.75X32.  Post intervention, there is a 0% residual stenosis.  1. Acute inferior ST elevation MI secondary to thrombotic occlusion of the RCA 2. Successful PTCA/aspiration thrombectomy/stenting of the proximal to mid RCA 3. Patent mid Circumflex stent with minimal restenosis 4. Mild to moderate mid LAD stenosis. Moderately severe very distal LAD stenosis that is unchanged from last cath. 5. Cardiogenic shock. IABP in place. Levophed drip at high dose. Recommendation: Will admit to the ICU. IABP at 1:1. IV heparin tonight when ACT under 180. Continue DAPT with ASA and Plavix for one month. Will resume Pradaxa when his IABP has been removed. High intensity statin. Beta blocker on hold with hypotension. Aggrastat drip for two hours post PCI. Echo tomorrow. PCCM present in cath lab and will follow in the ICU also.   DG Chest Port 1 View  Result Date: 04/23/2021 CLINICAL DATA:  Shortness of breath. EXAM: PORTABLE CHEST 1 VIEW COMPARISON:  06/05/2020 FINDINGS: Moderate right pleural effusion with right mid and lower lung collapse/consolidative opacity. No focal consolidation in the left lung. Interstitial markings are diffusely coarsened with chronic features. Left Port-A-Cath noted. Telemetry leads overlie the chest. IMPRESSION: Moderate right pleural effusion with right mid and lower lung collapse/consolidative opacity. Electronically Signed   By: Misty Stanley M.D.   On: 03/29/2021 17:33    Cardiac Studies   Cardiac catheterization as above, films reviewed.  2D echocardiogram pending.  Patient Profile     60 y.o. male presenting with an acute inferolateral  STEMI complicated by cardiogenic shock.  The patient has multiple severe comorbid medical conditions with history of  DVT/PE on chronic anticoagulation, recent stroke, lung cancer, and colorectal cancer  Assessment & Plan    1.  Acute inferolateral STEMI: Continue uninterrupted dual antiplatelet therapy with aspirin and clopidogrel.  Patient treated with primary PCI using overlapping drug-eluting stents in the RCA.  Not a candidate for ACE/ARB/beta-blocker in the setting of shock. 2.  Cardiogenic shock: Blood pressure appears to be improving this morning after persistent hypotension overnight.  Patient currently on 0.375 mcg/kg/min of milrinone, vasopressin, and 34 mcg/min of norepinephrine.  Mixed venous oxygen saturation has improved from 46 to 50 to 59.7 this morning.  CVP remains 4-5.  Patient remains on intra-aortic balloon pump 1:1.  Creatinine improving from 2.04-1.75 this morning.  All signs point to modest improvement, but I am concerned that the patient's cardiac index remains very low at 1.5.  Discussed with critical care team.  Continue current treatment at present with intra-aortic balloon pump at one-to-one and current inotropic support.  Wean as tolerated.  I would not recommend escalating hemodynamic support measures in this patient who is at poor baseline health with multiple problems outlined above.  I think the risk of complications with more aggressive support would outweigh any potential benefit.  Hopefully he will continue to improve with current measures. 3.  Acute on chronic kidney injury: Baseline creatinine is approximately 1.2.  Suspect secondary to hypoperfusion, acute MI.  Continue to optimize hemodynamics. 4.  Paroxysmal atrial fibrillation: Maintaining sinus rhythm at present.  Cover with IV heparin while critically ill. 5.  DVT/PE by history: Again, cover with IV heparin  The patient is critically ill with multiple organ systems failure and requires high complexity decision making for assessment and support, frequent evaluation and titration of therapies, application of advanced monitoring  technologies and extensive interpretation of multiple databases.   Critical Care Time devoted to patient care services described in this note is 45 minutes.    For questions or updates, please contact [place] Please consult [email or web address] for contact info under        Signed, Sherren Mocha, MD  04/01/2021, 7:55 AM

## 2021-04-01 NOTE — Progress Notes (Signed)
NAME:  Juan Wheeler, MRN:  [id number], DOB:  12/12/1960, LOS: 1 ADMISSION DATE:  03/30/2021, CONSULTATION DATE:  7/4 REFERRING MD:  Angelena Form, CHIEF COMPLAINT:  Chest pain   History of Present Illness:  60 y/o male with multiple medical problems presented with RCA STEMI to [facility].  He was moved to [facility] for LHC where he had a PCI to RCA and a balloon pump was placed due to cardiogenic shock.   Pertinent  Medical History  Ischemic cardiomyopathy 12/2020 TTE LVEF 01-02%, RV ssytolic function decreased aortic valve not well visualized, LA dilated CAD DVT, bilateral pulmonary emboli GERD Colon cancer> rectal adenocarcinoma Squamous cell lung cancer IIIa, diagnosed 2021> treated with carboplatin and paclitaxel 05/2020 > 07/2020 Atrial fibrillation on pradaxa Chronic low back pain  Tobacco abuse CVA in 02/2021 > vertebral artery stroke, left greater than right vascular territories.    Significant Hospital Events: Including procedures, antibiotic start and stop dates in addition to other pertinent events   7/4 admitted with RCA STEMI, cardiogenic shock, IABP  Interim History / Subjective:  Sleepy but able to wake up and talk to [place] Denies chest pain or dyspnea He wants to have something to drink, doesn't want to eat.   Objective   Blood pressure 121/75, pulse 77, temperature 98.24 F (36.8 C), resp. rate (!) 7, height 5\' 9"  (1.753 m), weight 62.6 kg, SpO2 93 %. PAP: (26-292)/(11-31) 30/13 CVP:  [2 mmHg-16 mmHg] 3 mmHg PCWP:  [12 mmHg-30 mmHg] 12 mmHg CO:  [2.5 L/min-3 L/min] 2.6 L/min CI:  [1.4 L/min/m2-1.7 L/min/m2] 1.5 L/min/m2      Intake/Output Summary (Last 24 hours) at 04/01/2021 7253 Last data filed at 04/01/2021 0700 Gross per 24 hour  Intake 1624.7 ml  Output 760 ml  Net 864.7 ml   Filed Weights   04/03/2021 1800 04/01/21 0413  Weight: 63.5 kg 62.6 kg    Examination:  General:  Resting comfortably in bed HENT: NCAT OP clear PULM: Crackles bases, normal effort CV: RRR, no  mgr, IABP audible GI: BS infrequent, colostomy in place, soft, nontender MSK: normal bulk and tone Neuro: sleepy but he will wake, alert, no distress, MAEW   Resolved Hospital Problem list     Assessment & Plan:  Cardiogenic shock RCA STEMI s/p PCI CVP low Give LR bolus now and LR infusion No escalation beyond current management (IABP, milrinone, norepinephrine, vasopressin) Continue lveophed, titrate to MAP > 65 Monitor UOP ASA, Plavix  Atrial fibrillation Tele Heparin per pharmacy consult  History of DVT/PE on Eliquis Heparin infusion  AKI > slight improvement overnight with inotrope therapy Monitor BMET and UOP Replace electrolytes as needed No role for CRRT if this worsens  Lung cancer Adenocarcinoma on liver biopsy from 01/2021, last heme onc note indicates unclear primary Colon cancer s/p colostomy Monitor imaging  Prognosis: poor based on his multiple advanced health problems prior to admission including advanced malignancy, recent strokes and PE and now with large MI. Discussed with the cardiology service, we will not offer more aggressive mechanical cardiac.  Palliative care consultation ordered.  Best Practice (right click and "Reselect all SmartList Selections" daily)   Diet/type: Regular consistency (see orders) DVT prophylaxis: systemic heparin GI prophylaxis: N/A Lines: yes and it is still needed Foley:  Yes, and it is still needed Code Status:  DNR Last date of multidisciplinary goals of care discussion [7/5 with [place], updated his wife with this information, she agrees.]  Labs   CBC: Recent Labs  Lab 04/26/2021 1632  04/13/2021 1652 04/10/2021 1925 04/01/2021 1928 04/18/2021 2155 04/01/21 0320  WBC 15.4*  --   --   --  17.6* 15.5*  NEUTROABS 12.8*  --   --   --   --   --   HGB 13.5 13.9 11.9*  12.2* 12.6* 12.6* 11.7*  HCT 40.9 41.0 35.0*  36.0* 37.0* 38.4* 35.8*  MCV 98.8  --   --   --  99.5 99.4  PLT 190  --   --   --  230 211    Basic  Metabolic Panel: Recent Labs  Lab 04/09/2021 1632 04/10/2021 1652 04/18/2021 1925 04/02/2021 1928 04/02/2021 2155 04/01/21 0320  NA 134* 133* 136  135 137 130* 132*  K 4.8 4.8 4.8  4.9 4.6 4.5 4.6  CL 101 102  --   --  100 103  CO2 22  --   --   --  20* 19*  GLUCOSE 126* 120*  --   --  220* 190*  BUN 28* 28*  --   --  28* 28*  CREATININE 2.02* 2.10*  --   --  2.04* 1.75*  CALCIUM 8.9  --   --   --  8.1* 8.3*  MG  --   --   --   --  1.8 1.9   GFR: Estimated Creatinine Clearance: 39.2 mL/min (A) (by C-G formula based on SCr of 1.75 mg/dL (H)). Recent Labs  Lab 04/17/2021 1632 04/14/2021 2155 04/07/2021 2335 04/01/21 0251 04/01/21 0320  WBC 15.4* 17.6*  --   --  15.5*  LATICACIDVEN  --   --  3.1* 3.0*  --     Liver Function Tests: Recent Labs  Lab 04/12/2021 1632  AST 113*  ALT 73*  ALKPHOS 234*  BILITOT 1.4*  PROT 6.9  ALBUMIN 2.6*   No results for input(s): LIPASE, AMYLASE in the last 168 hours. No results for input(s): AMMONIA in the last 168 hours.  ABG    Component Value Date/Time   PHART 7.364 04/22/2021 1925   PCO2ART 35.2 04/15/2021 1925   PO2ART 105 04/18/2021 1925   HCO3 22.4 04/23/2021 1928   TCO2 24 04/09/2021 1928   ACIDBASEDEF 3.0 (H) 04/17/2021 1928   O2SAT 59.7 04/01/2021 0605     Coagulation Profile: No results for input(s): INR, PROTIME in the last 168 hours.  Cardiac Enzymes: No results for input(s): CKTOTAL, CKMB, CKMBINDEX, TROPONINI in the last 168 hours.  HbA1C: Hgb A1c MFr Bld  Date/Time Value Ref Range Status  02/16/2021 04:45 AM 6.8 (H) 4.8 - 5.6 % Final    Comment:    (NOTE) Pre diabetes:          5.7%-6.4%  Diabetes:              >6.4%  Glycemic control for   <7.0% adults with diabetes   06/13/2016 05:40 AM 5.9 (H) 4.8 - 5.6 % Final    Comment:    (NOTE)         Pre-diabetes: 5.7 - 6.4         Diabetes: >6.4         Glycemic control for adults with diabetes: <7.0     CBG: Recent Labs  Lab 04/12/2021 2031  GLUCAP 116*        Critical care time: 40 minutes     Roselie Awkward, MD [place] PCCM Pager: [phone] Cell: [phone] If no response, please call [phone] until 7pm After 7:00 pm call [place]  [phone]  

## 2021-04-01 NOTE — Progress Notes (Signed)
  Echocardiogram 2D Echocardiogram has been performed.  Fidel Levy 04/01/2021, 12:27 PM

## 2021-04-01 NOTE — Progress Notes (Signed)
[place] for heparin Indication:  IABP  Allergies  Allergen Reactions   Tramadol Other (See Comments)    Felt sluggish and ineffective    Darvocet [Propoxyphene N-Acetaminophen] Palpitations    Patient Measurements: Height: 5\' 9"  (175.3 cm) Weight: 62.6 kg (138 lb 0.1 oz) IBW/kg (Calculated) : 70.7 Heparin Dosing Weight: 63.5 kg   Vital Signs: Temp: 98.24 F (36.8 C) (07/05 1930) Temp Source: Core (07/05 1600) BP: 105/81 (07/05 1900) Pulse Rate: 102 (07/05 1930)  Labs: Recent Labs    04/19/2021 1632 04/17/2021 1652 04/22/2021 1925 04/15/2021 1928 04/04/2021 2155 04/01/21 0320 04/01/21 1800  HGB 13.5 13.9   < > 12.6* 12.6* 11.7*  --   HCT 40.9 41.0   < > 37.0* 38.4* 35.8*  --   PLT 190  --   --   --  230 211  --   HEPARINUNFRC  --   --   --   --   --   --  <0.10*  CREATININE 2.02* 2.10*  --   --  2.04* 1.75*  --   TROPONINIHS 19,147*  --   --   --  >24,000*  --   --    < > = values in this interval not displayed.     Estimated Creatinine Clearance: 39.2 mL/min (A) (by C-G formula based on SCr of 1.75 mg/dL (H)).   Medical History: Past Medical History:  Diagnosis Date   Anticoagulation adequate 04/09/2021   Cardiomyopathy ([place]) 04/05/2021   Chronic lower back pain    a. Followed by pain management at [facility].   Colon cancer [facility])    rectal cancer   Coronary artery disease    a. 03/2013: abnl nuc -> LHC s/p DES to LCx, residual moderate disease in LAD (med rx unless refractory angina). b. Not on BB due to bradycardia.   DVT (deep venous thrombosis) ([place]) ~ 2013   Dysrhythmia    AFib   GERD (gastroesophageal reflux disease)    H/O necrotising fasciitis    History of blood transfusion    "once; after throwing up alot of blood" (04/17/2013)   Hypertension    LV dysfunction    a. EF 45% in 03/2013.   PAD (peripheral artery disease) ([place])    a. Occlusion of the right internal iliac artery, with significant atherosclerosis in the  left internal iliac which was not amenable to reconstruction per notes from [place] in place 05/30/2020   Pulmonary nodules 09/30/2014   Rectal cancer (HCC)    STEMI (ST elevation myocardial infarction) ([place]) 04/24/2021   STEMI involving oth coronary artery of inferior wall ([place]) 04/13/2021   Tobacco abuse     Medications:  Scheduled:   aspirin EC  81 mg Oral Daily   atorvastatin  80 mg Oral Daily   Chlorhexidine Gluconate Cloth  6 each Topical Daily   clopidogrel  75 mg Oral Q breakfast   [START ON 04/02/2021] feeding supplement  237 mL Oral BID BM   gabapentin  300 mg Oral QHS   insulin aspart  2-6 Units Subcutaneous Q4H   mouth rinse  15 mL Mouth Rinse BID   multivitamin with minerals  1 tablet Oral Daily   pantoprazole (PROTONIX) IV  40 mg Intravenous Q24H   sodium chloride flush  10-40 mL Intracatheter Q12H   sodium chloride flush  3 mL Intravenous Q12H   Zinc Oxide   Topical Daily    Assessment: 60  yom presenting with code STEMI. On pradaxa PTA for hx Afib and bilateral PE/DVT - LD 7/4@0900 .  Cardiac cath finding prox-mid RCA occlusion now s/p DES. Was hypotensive in cath found to be in cardiogenic shock with IABP placed - now at 1:1.   ACT has continued to be >180. Last ACT 196. It is possible that previous dabigatran use is affecting ACT. Discussed with Dr. Burt Knack and heparin level ordered for consideration of starting low dose heparin infusion. Heparin level undetectable.   Goal of Therapy:  Heparin level 0.3-0.5 units/ml Monitor platelets by anticoagulation protocol: Yes   Plan:  Start heparin 550 units/hr Check 6 hr heparin level  Monitor heparin level, CBC and s/s of bleeding daily   Cristela Felt, PharmD Clinical Pharmacist

## 2021-04-01 NOTE — Progress Notes (Signed)
[place] for heparin Indication:  IABP  Allergies  Allergen Reactions   Tramadol Other (See Comments)    Felt sluggish and ineffective    Darvocet [Propoxyphene N-Acetaminophen] Palpitations    Patient Measurements: Height: 5\' 9"  (175.3 cm) Weight: 62.6 kg (138 lb 0.1 oz) IBW/kg (Calculated) : 70.7 Heparin Dosing Weight: 63.5 kg   Vital Signs: Temp: 97.7 F (36.5 C) (07/05 1515) Temp Source: Core (07/05 1200) BP: 101/62 (07/05 1500) Pulse Rate: 92 (07/05 1515)  Labs: Recent Labs    04/24/2021 1632 04/17/2021 1652 04/22/2021 1925 04/22/2021 1928 04/06/2021 2155 04/01/21 0320  HGB 13.5 13.9   < > 12.6* 12.6* 11.7*  HCT 40.9 41.0   < > 37.0* 38.4* 35.8*  PLT 190  --   --   --  230 211  CREATININE 2.02* 2.10*  --   --  2.04* 1.75*  TROPONINIHS 85,885*  --   --   --  >24,000*  --    < > = values in this interval not displayed.     Estimated Creatinine Clearance: 39.2 mL/min (A) (by C-G formula based on SCr of 1.75 mg/dL (H)).   Medical History: Past Medical History:  Diagnosis Date   Anticoagulation adequate 04/10/2021   Cardiomyopathy ([place]) 04/26/2021   Chronic lower back pain    a. Followed [facility] management at [facility].   Colon cancer [facility])    rectal cancer   Coronary artery disease    a. 03/2013: abnl nuc -> LHC s/p DES to LCx, residual moderate disease in LAD (med rx unless refractory angina). b. Not on BB due to bradycardia.   DVT (deep venous thrombosis) ([place]) ~ 2013   Dysrhythmia    AFib   GERD (gastroesophageal reflux disease)    H/O necrotising fasciitis    History of blood transfusion    "once; after throwing up alot of blood" (04/17/2013)   Hypertension    LV dysfunction    a. EF 45% in 03/2013.   PAD (peripheral artery disease) ([place])    a. Occlusion of the right internal iliac artery, with significant atherosclerosis in the left internal iliac which was not amenable to reconstruction per notes from [place] in place 05/30/2020   Pulmonary nodules 09/30/2014   Rectal cancer (HCC)    STEMI (ST elevation myocardial infarction) ([place]) 03/28/2021   STEMI involving oth coronary artery of inferior wall ([place]) 04/01/2021   Tobacco abuse     Medications:  Scheduled:   aspirin EC  81 mg Oral Daily   atorvastatin  80 mg Oral Daily   Chlorhexidine Gluconate Cloth  6 each Topical Daily   clopidogrel  75 mg Oral Q breakfast   gabapentin  300 mg Oral QHS   insulin aspart  2-6 Units Subcutaneous Q4H   mouth rinse  15 mL Mouth Rinse BID   pantoprazole (PROTONIX) IV  40 mg Intravenous Q24H   sodium chloride flush  10-40 mL Intracatheter Q12H   sodium chloride flush  3 mL Intravenous Q12H   Zinc Oxide   Topical Daily    Assessment: 32 yom presenting with code STEMI. On pradaxa PTA for hx Afib and bilateral PE/DVT - LD 7/4@0900 .  Cardiac cath finding prox-mid RCA occlusion now s/p DES. Was hypotensive in cath found to be in cardiogenic shock with IABP placed - now at 1:1.  -ACT remains > 200 (last was 203)  Goal of Therapy:  Heparin level 0.3-0.5 units/ml  Monitor platelets by anticoagulation protocol: Yes   Plan:  -Check a heparin level and if < 0.3 will consider starting heparin   Hildred Laser, PharmD Clinical Pharmacist **Pharmacist phone directory can now be found on [email or web address] ([email or web address]).  Listed under [place].

## 2021-04-01 NOTE — Progress Notes (Signed)
[place] for Milrinone Indication: Cardiogenic Shock  Allergies  Allergen Reactions   Tramadol Other (See Comments)    Felt sluggish and ineffective    Darvocet [Propoxyphene N-Acetaminophen] Palpitations    Patient Measurements: Height: 5\' 9"  (175.3 cm) Weight: 63.5 kg (140 lb) IBW/kg (Calculated) : 70.7  Vital Signs: Temp: 98.8 F (37.1 C) (07/05 0015) Temp Source: Core (07/05 0000) BP: 95/85 (07/05 0000) Pulse Rate: 59 (07/05 0000) Intake/Output from previous day: 07/04 0701 - 07/05 0700 In: 1012.8 [P.O.:120; I.V.:892.8] Out: 200 [Stool:200] Intake/Output from this shift: Total I/O In: 1012.8 [P.O.:120; I.V.:892.8] Out: 200 [Stool:200]  Labs: Recent Labs    03/30/2021 1632 04/22/2021 1652 04/16/2021 1925 04/14/2021 1928 04/21/2021 2155  WBC 15.4*  --   --   --  17.6*  HGB 13.5 13.9 11.9*  12.2* 12.6* 12.6*  HCT 40.9 41.0 35.0*  36.0* 37.0* 38.4*  PLT 190  --   --   --  230  CREATININE 2.02* 2.10*  --   --  2.04*  MG  --   --   --   --  1.8  ALBUMIN 2.6*  --   --   --   --   PROT 6.9  --   --   --   --   AST 113*  --   --   --   --   ALT 73*  --   --   --   --   ALKPHOS 234*  --   --   --   --   BILITOT 1.4*  --   --   --   --    Estimated Creatinine Clearance: 34.2 mL/min (A) (by C-G formula based on SCr of 2.04 mg/dL (H)).    Assessment: 60 y/o M s/p cath/PCI in cardiogenic shock with IABP in place. Consulted to order Milrinone. Tenuous BP on Levophed.  Renal dysfunction with Scr 2.04.   Plan:  -Milrinone 0.25 mcg/kg/min -Any further titrations per cardiology -Close BP monitoring; contact on call cardiology if unable to maintain adequate pressures with Milrinone/Levophed  Narda Bonds, PharmD, [place] Pharmacist Phone: [phone]

## 2021-04-01 NOTE — Consult Note (Signed)
Patient know to the [place] team from admission last week [place] Nurse Consult Note: Patient with hx of rectal adenocarcinoma Reason for Consult: " gel spacer with blue extension tubing in rectum"  Wound type: Gel spacer placed during radiation therapy for rectal cancer with blue extension hanging out of the rectum. Per notes last week patient states that he has had it for years. Triple paste to protect area Continue to place foam dressings to the area and change PRN soilage.    Monitor the wound area(s) for worsening of condition such as: Signs/symptoms of infection, increase in size, development of or worsening of odor, development of pain, or increased pain at the affected locations.   Notify the medical team if any of these develop.   Thank you for the consult. [place] nurse will not follow at this time.   Please re-consult the [place] team if needed. [address], [place], [place]

## 2021-04-02 ENCOUNTER — Inpatient Hospital Stay (HOSPITAL_COMMUNITY): Payer: Medicare PPO

## 2021-04-02 DIAGNOSIS — J9601 Acute respiratory failure with hypoxia: Secondary | ICD-10-CM | POA: Diagnosis not present

## 2021-04-02 DIAGNOSIS — E43 Unspecified severe protein-calorie malnutrition: Secondary | ICD-10-CM | POA: Insufficient documentation

## 2021-04-02 DIAGNOSIS — Z7901 Long term (current) use of anticoagulants: Secondary | ICD-10-CM | POA: Diagnosis not present

## 2021-04-02 DIAGNOSIS — Z515 Encounter for palliative care: Secondary | ICD-10-CM

## 2021-04-02 DIAGNOSIS — Z66 Do not resuscitate: Secondary | ICD-10-CM | POA: Diagnosis not present

## 2021-04-02 DIAGNOSIS — J9602 Acute respiratory failure with hypercapnia: Secondary | ICD-10-CM

## 2021-04-02 DIAGNOSIS — J90 Pleural effusion, not elsewhere classified: Secondary | ICD-10-CM

## 2021-04-02 DIAGNOSIS — Z7189 Other specified counseling: Secondary | ICD-10-CM

## 2021-04-02 DIAGNOSIS — I213 ST elevation (STEMI) myocardial infarction of unspecified site: Secondary | ICD-10-CM | POA: Diagnosis not present

## 2021-04-02 DIAGNOSIS — I2119 ST elevation (STEMI) myocardial infarction involving other coronary artery of inferior wall: Secondary | ICD-10-CM | POA: Diagnosis not present

## 2021-04-02 LAB — CBC WITH DIFFERENTIAL/PLATELET
Abs Immature Granulocytes: 0.05 10*3/uL (ref 0.00–0.07)
Basophils Absolute: 0 10*3/uL (ref 0.0–0.1)
Basophils Relative: 0 %
Eosinophils Absolute: 0.1 10*3/uL (ref 0.0–0.5)
Eosinophils Relative: 1 %
HCT: 27.6 % — ABNORMAL LOW (ref 39.0–52.0)
Hemoglobin: 9.2 g/dL — ABNORMAL LOW (ref 13.0–17.0)
Immature Granulocytes: 1 %
Lymphocytes Relative: 5 %
Lymphs Abs: 0.5 10*3/uL — ABNORMAL LOW (ref 0.7–4.0)
MCH: 32.6 pg (ref 26.0–34.0)
MCHC: 33.3 g/dL (ref 30.0–36.0)
MCV: 97.9 fL (ref 80.0–100.0)
Monocytes Absolute: 1 10*3/uL (ref 0.1–1.0)
Monocytes Relative: 10 %
Neutro Abs: 7.9 10*3/uL — ABNORMAL HIGH (ref 1.7–7.7)
Neutrophils Relative %: 83 %
Platelets: 157 10*3/uL (ref 150–400)
RBC: 2.82 MIL/uL — ABNORMAL LOW (ref 4.22–5.81)
RDW: 15.3 % (ref 11.5–15.5)
WBC: 9.5 10*3/uL (ref 4.0–10.5)
nRBC: 0 % (ref 0.0–0.2)

## 2021-04-02 LAB — COMPREHENSIVE METABOLIC PANEL
ALT: 53 U/L — ABNORMAL HIGH (ref 0–44)
AST: 80 U/L — ABNORMAL HIGH (ref 15–41)
Albumin: 2 g/dL — ABNORMAL LOW (ref 3.5–5.0)
Alkaline Phosphatase: 192 U/L — ABNORMAL HIGH (ref 38–126)
Anion gap: 7 (ref 5–15)
BUN: 15 mg/dL (ref 8–23)
CO2: 21 mmol/L — ABNORMAL LOW (ref 22–32)
Calcium: 8 mg/dL — ABNORMAL LOW (ref 8.9–10.3)
Chloride: 104 mmol/L (ref 98–111)
Creatinine, Ser: 0.97 mg/dL (ref 0.61–1.24)
GFR, Estimated: >60 mL/min (ref >60)
Glucose, Bld: 116 mg/dL — ABNORMAL HIGH (ref 70–99)
Potassium: 4 mmol/L (ref 3.5–5.1)
Sodium: 132 mmol/L — ABNORMAL LOW (ref 135–145)
Total Bilirubin: 1.3 mg/dL — ABNORMAL HIGH (ref 0.3–1.2)
Total Protein: 5.1 g/dL — ABNORMAL LOW (ref 6.5–8.1)

## 2021-04-02 LAB — COOXEMETRY PANEL
Carboxyhemoglobin: 0.9 % (ref 0.5–1.5)
Carboxyhemoglobin: 0.9 % (ref 0.5–1.5)
Methemoglobin: 0.7 % (ref 0.0–1.5)
Methemoglobin: 0.9 % (ref 0.0–1.5)
O2 Saturation: 61.8 %
O2 Saturation: 59 %
Total hemoglobin: 13.6 g/dL (ref 12.0–16.0)
Total hemoglobin: 14.1 g/dL (ref 12.0–16.0)

## 2021-04-02 LAB — GLUCOSE, CAPILLARY
Glucose-Capillary: 115 mg/dL — ABNORMAL HIGH (ref 70–99)
Glucose-Capillary: 115 mg/dL — ABNORMAL HIGH (ref 70–99)
Glucose-Capillary: 117 mg/dL — ABNORMAL HIGH (ref 70–99)
Glucose-Capillary: 123 mg/dL — ABNORMAL HIGH (ref 70–99)
Glucose-Capillary: 124 mg/dL — ABNORMAL HIGH (ref 70–99)
Glucose-Capillary: 165 mg/dL — ABNORMAL HIGH (ref 70–99)

## 2021-04-02 LAB — HEPARIN LEVEL (UNFRACTIONATED)
Heparin Unfractionated: 0.14 IU/mL — ABNORMAL LOW (ref 0.30–0.70)
Heparin Unfractionated: <0.1 IU/mL — ABNORMAL LOW (ref 0.30–0.70)

## 2021-04-02 LAB — BODY FLUID CELL COUNT WITH DIFFERENTIAL
Eos, Fluid: 0 %
Lymphs, Fluid: 79 %
Monocyte-Macrophage-Serous Fluid: 0 % — ABNORMAL LOW (ref 50–90)
Neutrophil Count, Fluid: 21 % (ref 0–25)
Total Nucleated Cell Count, Fluid: 225 cu mm (ref 0–1000)

## 2021-04-02 LAB — CBC
HCT: 27 % — ABNORMAL LOW (ref 39.0–52.0)
Hemoglobin: 9.1 g/dL — ABNORMAL LOW (ref 13.0–17.0)
MCH: 33.1 pg (ref 26.0–34.0)
MCHC: 33.7 g/dL (ref 30.0–36.0)
MCV: 98.2 fL (ref 80.0–100.0)
Platelets: 152 10*3/uL (ref 150–400)
RBC: 2.75 MIL/uL — ABNORMAL LOW (ref 4.22–5.81)
RDW: 15.5 % (ref 11.5–15.5)
WBC: 8.9 10*3/uL (ref 4.0–10.5)
nRBC: 0 % (ref 0.0–0.2)

## 2021-04-02 LAB — LACTATE DEHYDROGENASE, PLEURAL OR PERITONEAL FLUID: LD, Fluid: 337 U/L — ABNORMAL HIGH (ref 3–23)

## 2021-04-02 LAB — PROTEIN, PLEURAL OR PERITONEAL FLUID: Total protein, fluid: 3.4 g/dL

## 2021-04-02 LAB — GLUCOSE, PLEURAL OR PERITONEAL FLUID: Glucose, Fluid: 128 mg/dL

## 2021-04-02 MED ORDER — AMIODARONE HCL IN DEXTROSE 360-4.14 MG/200ML-% IV SOLN
30.0000 mg/h | INTRAVENOUS | Status: DC
  Administered 2021-04-03 (×2): 30 mg/h via INTRAVENOUS
  Filled 2021-04-02: qty 200

## 2021-04-02 MED ORDER — AMIODARONE LOAD VIA INFUSION
150.0000 mg | Freq: Once | INTRAVENOUS | Status: AC
  Administered 2021-04-02: 150 mg via INTRAVENOUS
  Filled 2021-04-02: qty 83.34

## 2021-04-02 MED ORDER — AMIODARONE HCL IN DEXTROSE 360-4.14 MG/200ML-% IV SOLN
60.0000 mg/h | INTRAVENOUS | Status: AC
  Administered 2021-04-02 (×2): 60 mg/h via INTRAVENOUS
  Filled 2021-04-02 (×2): qty 200

## 2021-04-02 NOTE — Procedures (Signed)
Thoracentesis  Procedure Note  Juan Wheeler  [id number]  03/09/1961  Date:04/02/21  Time:3:25 PM   Provider Performing:Juan Wheeler Juan Donald Pore NP   Procedure: Thoracentesis with imaging guidance [phone])  Indication(s) Pleural Effusion  Consent Risks of the procedure as well as the alternatives and risks of each were explained to the patient and/or caregiver.  Consent for the procedure was obtained and is signed in the bedside chart  Anesthesia Topical only with 1% lidocaine    Time Out Verified patient identification, verified procedure, site/side was marked, verified correct patient position, special equipment/implants available, medications/allergies/relevant history reviewed, required imaging and test results available.   Sterile Technique Maximal sterile technique including full sterile barrier drape, hand hygiene, sterile gown, sterile gloves, mask, hair covering, sterile ultrasound probe cover (if used).  Procedure Description Ultrasound was used to identify appropriate pleural anatomy for placement and overlying skin marked.  Area of drainage cleaned and draped in sterile fashion. Lidocaine was used to anesthetize the skin and subcutaneous tissue.  1200 cc's of dark straw colored appearing fluid was drained from the right pleural space. Catheter then removed and bandaid applied to site.     Complications/Tolerance None; patient tolerated the procedure well. Chest X-ray is ordered to confirm no post-procedural complication.   EBL Minimal   Specimen(s) Pleural fluid   Juan Wheeler [facility] 04/02/2021, 3:27 PM  Please see [email or web address] for pager details.  From 7A-7P if no response, please call [phone]. After hours, please call ELink [phone].

## 2021-04-02 NOTE — Progress Notes (Signed)
[place] for heparin Indication:  IABP  and h/o Afib  Allergies  Allergen Reactions   Tramadol Other (See Comments)    Felt sluggish and ineffective    Darvocet [Propoxyphene N-Acetaminophen] Palpitations    Patient Measurements: Height: 5\' 9"  (175.3 cm) Weight: 62.6 kg (138 lb 0.1 oz) IBW/kg (Calculated) : 70.7 Heparin Dosing Weight: 63.5 kg   Vital Signs: Temp: 97.88 F (36.6 C) (07/06 0439) Temp Source: Core (07/06 0400) BP: 111/81 (07/06 0400) Pulse Rate: 90 (07/06 0439)  Labs: Recent Labs    04/09/2021 1632 04/14/2021 1652 04/09/2021 2155 04/01/21 0320 04/01/21 1800 04/02/21 0315  HGB 13.5   < > 12.6* 11.7*  --  9.2*  HCT 40.9   < > 38.4* 35.8*  --  27.6*  PLT 190  --  230 211  --  157  HEPARINUNFRC  --   --   --   --  <0.10* <0.10*  CREATININE 2.02*   < > 2.04* 1.75*  --  0.97  TROPONINIHS 19,869*  --  >24,000*  --   --   --    < > = values in this interval not displayed.     Estimated Creatinine Clearance: 70.8 mL/min (by C-G formula based on SCr of 0.97 mg/dL).   Assessment: 60 y.o. male admitted with STEMI s/p PCI, IABP, h/o Afib and Pradaxa on hold, for heparin  Goal of Therapy:  Heparin level 0.3-0.5 units/ml Monitor platelets by anticoagulation protocol: Yes   Plan:  Increase Heparin 700 units/hr  Check heparin level in 6 hours.   Phillis Knack, PharmD, BCPS

## 2021-04-02 NOTE — Consult Note (Addendum)
Palliative Medicine Inpatient Consult Note  Reason for consult:  Goals of Care  HPI:  Per intake H&P --> Juan Wheeler is a 60 yo male with history of HTN, chronic kidney disease, colorectal adenocarcinoma, lung cancer (squamous cell carcinoma), tobacco abuse, DVT, bilateral PE on Pradaxa, paroxysmal atrial fibrillation, CAD with prior stenting of the Circumflex artery in 2014, ischemic cardiomyopathy with LVEF=30-35% in April 2022, PAD with prior left fem-pop bypass in 2016 and stroke May 2022 who presented to the [facility] ED with c/o chest pain around 4:45 pm. His chest pain began around 7 am but he did not seek medical attention. He has been having pain all day. No relief with NTG. EKG in ED with inferior and anterolateral ST elevation. Pt with ongoing chest pain. Code STEMI activated by ED staff. Systolic BP 66-29 in the ED. He was given a fluid bolus, heparin bolus and started on Levophed.  Palliative care had seen Juan Wheeler in April of this year. At that time he had elected for all measures to sustain life. A review of his PMH and comorbid conditions was had.   We were asked in the setting of recurrent re-hospitalizations and overall poor health state to readdress goals of care.   Clinical Assessment/Goals of Care:  *Please note that this is a verbal dictation therefore any spelling or grammatical errors are due to the "[place] [employer]" system interpretation.  I have reviewed medical records including EPIC notes, labs and imaging, received report from bedside RN, assessed the patient who was lying in bed.    I met with Juan Wheeler to further discuss diagnosis prognosis, GOC, EOL wishes, disposition and options.   I introduced Palliative Medicine as specialized medical care for people living with serious illness. It focuses on providing relief from the symptoms and stress of a serious illness. The goal is to improve quality of life for both the patient and the family.  Juan Wheeler shares  with me that he is from [place]. He presently lives in [place]. He is married and has five children from prior relationships. He use to work at a [employer]. He is a man of faith and practices within the [employer] denomination.  Prior to hospitalization he suffered hemiplegia and was utilizing a wheel chair for mobility. He had a prolonged stay at [facility] inpatient rehabilitation after suffering embolic showers. He was discharged to home on 6/15.   I asked Juan Wheeler if he understand what is occurring with him presently. He shares with me that he has severe pain in his chest which was a "heart attack". He recounts that he is now in the ICU receiving care. A careful review of Juan Wheeler's multiple co-morbidities was had inclusive of his CAD, CVA(s), heart failure, and STEMI.   A detailed discussion was had today regarding advanced directives - we have none on file though Juan Wheeler is interested in completing these while hospitalized. Juan Wheeler shares he would with for his wife, Juan Wheeler to be his Media planner.    Concepts specific to code status, artifical feeding and hydration, continued IV antibiotics and rehospitalization was had.  We reviewed that Juan Wheeler has very poor cardiac function. Discussed that if he underwent cardiac arrest his outcomes would be less than favorable and that we would unfortunately cause his body great harm with no benefit. He understood this and has elected to be DNAR/DNI.  Presently Juan Wheeler's goals are to gain strength. He would like to ideally feel well again.   We will refer Juan Wheeler  to OP Palliative care services upon discharge given his multiple co-morbidities and high risk for decline.   Discussed the importance of continued conversation with family and their  medical providers regarding overall plan of care and treatment options, ensuring decisions are within the context of the patients values and GOCs.  Provided "Hard Choices for Loving People" booklet.   ________________________________________________________________________ Addendum:  Have called patients spouse, Juan Wheeler and left a VM for her to call back to provide a formal introduction to Palliative care services. _________________________________________________________________________ Addendum #2:  I spoke to patients wife, Juan Wheeler this afternoon. We reviewed his medical course since admission. She shares we me that he has "been through an awful lot". Discussed the reality that Juan Wheeler's health has been failing for quite sometime now requiring her to provide support of all bADL's in their home. She shares that he has suffered from lung cancer, colon cancer, strokes, multiple heart attacks, and an extremely weak heart. She reviews that she has been witness to him gradually getting more depleted over time. She also states that prior to hospitalization his nutritional intake and mobility were the poorest they have ever been.   A discuss was held regarding where we go from here. I shared that ideally Juan Wheeler would be optimized off of all present interventions though there is no guarantee that this will happen.We reviewed that Juan Wheeler is critically ill and has a very high risk for decline. I shared even if he does get off of all present interventions I suspect we may be discussing hospice moving forward Juan Wheeler is familiar with hospice as her twin sisters husband is also quite ill and under hospice services presently.  I described hospice as a service for patients for have a life expectancy of < 86month. It preserves dignity and quality at the end phases of life. The focus changes from curative to symptom relief.   We reviewed the worst case scenario which is that DJoziyahis medically unstable to wean off interventions and/or he has an intolerance to weaning. In this case comfort measures were discussed. I shared that this would be an instance where interventions would only be geared towards complex  symptomatic relief and that Juan Wheeler's time would likely be very short. Juan Wheeler expressed understanding of the worst case scenario and shared if that did happen she would only wish for him to not suffer.  JCurly Shoreswas thankful for the information. She states that she will be working daily until Sunday and she would like to be updated regularly by the medical team in regard to Cason's care. She states that she will be available for phone calls after 5PM. I told her that I would share this with the primary team.  Time In: 1610 Time Out: 1646 Total Time:  36 additional minutes Greater than 50%  of this time was spent counseling and coordinating care related to the above assessment and plan.  Decision Maker: Juan Wheeler(Spouse) [phone] SUMMARY OF RECOMMENDATIONS   DNAR/DNI  Appreciate Spiritual care helping with advance directives  Goal: To gain strength and feel "well" again; --> PMT plan to guide patient and his wife regarding goals which are realistic. If he continues to decline we will further discuss topics such as comfort & Hospice  TOC - OP Palliative care referral  Ongoing PMT support while inpatient  Code Status/Advance Care Planning: DNAR/DNI   Palliative Prophylaxis:  Oral Care, Turn Q2H  Additional Recommendations (Limitations, Scope, Preferences): Continue current scope of care   Psycho-social/Spiritual:  Desire for  further Chaplaincy support: Yes Additional Recommendations: Education on heart failure, CV disease   Prognosis: Poor in the setting of multiple co-morbidities, hypalbuminemia, and frailty.   Discharge Planning: Discharge plan is uncertain at this time.   Vitals:   04/02/21 0500 04/02/21 0600  BP: (!) 122/101 (!) 125/97  Pulse: 95 88  Resp: 15 15  Temp: 97.88 F (36.6 C) 97.88 F (36.6 C)  SpO2: 93% 94%    Intake/Output Summary (Last 24 hours) at 04/02/2021 0700 Last data filed at 04/02/2021 0600 Gross per 24 hour  Intake 3265.89 ml   Output 928 ml  Net 2337.89 ml   Last Weight  Most recent update: 04/02/2021  5:07 AM    Weight  67.4 kg (148 lb 9.4 oz)            Gen:  Older AA M in NAD HEENT: moist mucous membranes CV: Regular rate and irregular rhythm  PULM:  2LPM [place] ABD: soft/nontender  EXT: weak pulse in LLE, doppler to RLE Neuro: Alert and oriented x3   PPS: 30%  --> PO's are poor   This conversation/these recommendations were discussed with patient primary care team, Dr. Angelena Form  Time In: 0650 Time Out: 0800 Total Time: 70 Greater than 50%  of this time was spent counseling and coordinating care related to the above assessment and plan.  [place] Team Team Cell Phone: [phone] Please utilize secure chat with additional questions, if there is no response within 30 minutes please call the above phone number  Palliative Medicine Team providers are available by phone from 7am to 7pm daily and can be reached through the team cell phone.  Should this patient require assistance outside of these hours, please call the patient's attending physician.

## 2021-04-02 NOTE — Progress Notes (Signed)
IABP began to alarm Fiber-Optic Sensor Module Failure. Juan Wheeler from cath lab and he came to bedside to evaluate.  We switched to ABP cable and regained ABP.  IABP console switch and new console read Fiber Optic.  Complete change over done without incident.  Pt and vitals remained stable during this time.  Dr. Angelena Form at bedside as well.

## 2021-04-02 NOTE — Progress Notes (Signed)
NAME:  Juan Wheeler, MRN:  [id number], DOB:  Apr 07, 1961, LOS: 2 ADMISSION DATE:  04/18/2021, CONSULTATION DATE:  7/4 REFERRING MD:  Angelena Form, CHIEF COMPLAINT:  Chest pain   History of Present Illness:  59 y/o male with multiple medical problems presented with RCA STEMI to [facility].  He was moved to [facility] for LHC where he had a PCI to RCA and a balloon pump was placed due to cardiogenic shock.   Pertinent  Medical History  Ischemic cardiomyopathy 12/2020 TTE LVEF 25-36%, RV ssytolic function decreased aortic valve not well visualized, LA dilated CAD DVT, bilateral pulmonary emboli GERD Colon cancer> rectal adenocarcinoma Squamous cell lung cancer IIIa, diagnosed 2021> treated with carboplatin and paclitaxel 05/2020 > 07/2020 Atrial fibrillation on pradaxa Chronic low back pain  Tobacco abuse CVA in 02/2021 > vertebral artery stroke, left greater than right vascular territories.    Significant Hospital Events: Including procedures, antibiotic start and stop dates in addition to other pertinent events   7/4 admitted with RCA STEMI, cardiogenic shock, IABP Renal function improved, mental status O  Interim History / Subjective:   Awake, alert today No pain Mild dyspnea [place] 61.8  Objective   Blood pressure 123/87, pulse 87, temperature 98.06 F (36.7 C), resp. rate 13, height 5\' 9"  (1.753 m), weight 67.4 kg, SpO2 96 %. PAP: (29-41)/(11-20) 39/18 CVP:  [1 mmHg-10 mmHg] 7 mmHg PCWP:  [15 mmHg-17 mmHg] 16 mmHg CO:  [3.4 L/min-4.5 L/min] 4.5 L/min CI:  [1.9 L/min/m2-2.5 L/min/m2] 2.5 L/min/m2      Intake/Output Summary (Last 24 hours) at 04/02/2021 6440 Last data filed at 04/02/2021 0700 Gross per 24 hour  Intake 3217.03 ml  Output 1000 ml  Net 2217.03 ml   Filed Weights   04/10/2021 1800 04/01/21 0413 04/02/21 0500  Weight: 63.5 kg 62.6 kg 67.4 kg    Examination:  General:  Resting comfortably in bed HENT: NCAT OP clear PULM: Diminished R base, otherwise CTA, normal effort CV:  RRR, no mgr, balloon pump sounds noted GI: BS+, soft, nontender MSK: normal bulk and tone Neuro: awake, alert, no distress, MAEW   Resolved Hospital Problem list     Assessment & Plan:  Cardiogenic shock > UOP OK, renal function improving RCA STEMI s/p PCI CVP improved 7/6 with fluids Wean off norepinephrine for MAP > 65 Continue vasopressin until norepinephrine dose under 10 mcg/min Continue milrinone If off vaso/levophed then stop balloon pump ASA, plavix Monitor UOP Would not extend inotrope or mechanical support for more than 7 days from time of cath given severe underlying comorbid illnesses  Atrial fibrillation Tele Heparin per pharmacy consult  History of DVT/PE on Eliquis Heparin in fusion  Right pleural effusion, unclear cause Thoracentesis today> diagnostic and therapeutic   AKI > improved Monitor BMET and UOP Replace electrolytes as needed  Lung cancer Adenocarcinoma on liver biopsy from 01/2021, last heme onc note indicates unclear primary As above  Prognosis: poor.  Continue current management but he would not benefit from prolonged therapy given his severe underlying illnesses.  Palliative care consultation pending.  Best Practice (right click and "Reselect all SmartList Selections" daily)   Diet/type: Regular consistency (see orders) DVT prophylaxis: systemic heparin GI prophylaxis: N/A Lines: yes and it is still needed Foley:  Yes, and it is still needed Code Status:  DNR Last date of multidisciplinary goals of care discussion [7/5 with [place], updated his wife with this information, she agrees.]  Labs   CBC: Recent Labs  Lab 04/11/2021 1632 04/05/2021  1652 04/12/2021 1925 04/27/2021 1928 03/30/2021 2155 04/01/21 0320 04/02/21 0315  WBC 15.4*  --   --   --  17.6* 15.5* 9.5  NEUTROABS 12.8*  --   --   --   --   --  7.9*  HGB 13.5   < > 11.9*  12.2* 12.6* 12.6* 11.7* 9.2*  HCT 40.9   < > 35.0*  36.0* 37.0* 38.4* 35.8* 27.6*  MCV 98.8  --   --    --  99.5 99.4 97.9  PLT 190  --   --   --  230 211 157   < > = values in this interval not displayed.    Basic Metabolic Panel: Recent Labs  Lab 04/17/2021 1632 04/10/2021 1652 03/30/2021 1925 04/07/2021 1928 04/12/2021 2155 04/01/21 0320 04/02/21 0315  NA 134* 133* 136  135 137 130* 132* 132*  K 4.8 4.8 4.8  4.9 4.6 4.5 4.6 4.0  CL 101 102  --   --  100 103 104  CO2 22  --   --   --  20* 19* 21*  GLUCOSE 126* 120*  --   --  220* 190* 116*  BUN 28* 28*  --   --  28* 28* 15  CREATININE 2.02* 2.10*  --   --  2.04* 1.75* 0.97  CALCIUM 8.9  --   --   --  8.1* 8.3* 8.0*  MG  --   --   --   --  1.8 1.9  --    GFR: Estimated Creatinine Clearance: 76.2 mL/min (by C-G formula based on SCr of 0.97 mg/dL). Recent Labs  Lab 04/20/2021 1632 04/15/2021 2155 03/30/2021 2335 04/01/21 0251 04/01/21 0320 04/02/21 0315  WBC 15.4* 17.6*  --   --  15.5* 9.5  LATICACIDVEN  --   --  3.1* 3.0*  --   --     Liver Function Tests: Recent Labs  Lab 04/18/2021 1632 04/02/21 0315  AST 113* 80*  ALT 73* 53*  ALKPHOS 234* 192*  BILITOT 1.4* 1.3*  PROT 6.9 5.1*  ALBUMIN 2.6* 2.0*   No results for input(s): LIPASE, AMYLASE in the last 168 hours. No results for input(s): AMMONIA in the last 168 hours.  ABG    Component Value Date/Time   PHART 7.364 04/05/2021 1925   PCO2ART 35.2 04/22/2021 1925   PO2ART 105 04/04/2021 1925   HCO3 22.4 04/24/2021 1928   TCO2 24 04/24/2021 1928   ACIDBASEDEF 3.0 (H) 04/03/2021 1928   O2SAT 61.8 04/02/2021 0325     Coagulation Profile: No results for input(s): INR, PROTIME in the last 168 hours.  Cardiac Enzymes: No results for input(s): CKTOTAL, CKMB, CKMBINDEX, TROPONINI in the last 168 hours.  HbA1C: Hgb A1c MFr Bld  Date/Time Value Ref Range Status  02/16/2021 04:45 AM 6.8 (H) 4.8 - 5.6 % Final    Comment:    (NOTE) Pre diabetes:          5.7%-6.4%  Diabetes:              >6.4%  Glycemic control for   <7.0% adults with diabetes   06/13/2016 05:40  AM 5.9 (H) 4.8 - 5.6 % Final    Comment:    (NOTE)         Pre-diabetes: 5.7 - 6.4         Diabetes: >6.4         Glycemic control for adults with diabetes: <7.0     CBG:  Recent Labs  Lab 04/01/21 1610 04/01/21 1938 04/01/21 2324 04/02/21 0401 04/02/21 0649  GLUCAP 149* 164* 133* 115* 115*       Critical care time: 35 minutes     Roselie Awkward, MD [place] [facility] Pager: [phone] Cell: [phone] If no response, please call [phone] until 7pm After 7:00 pm call Elink  [phone]

## 2021-04-02 NOTE — Plan of Care (Signed)
  Problem: Cardiac: Goal: Ability to maintain an adequate cardiac output will improve Outcome: Progressing   Problem: Coping: Goal: Level of anxiety will decrease Outcome: Progressing   Problem: Fluid Volume: Goal: Risk for excess fluid volume will decrease Outcome: Progressing   Problem: Respiratory: Goal: Will regain and/or maintain adequate ventilation Outcome: Progressing

## 2021-04-02 NOTE — Progress Notes (Addendum)
Progress Note  Patient Name: Juan Wheeler Date of Encounter: 04/02/2021  [facility] Cardiologist: Kate Sable, MD (Inactive) [facility]  Subjective   No chest pain or dyspnea this am. No events overnight  Inpatient Medications    Scheduled Meds:  aspirin EC  81 mg Oral Daily   atorvastatin  80 mg Oral Daily   Chlorhexidine Gluconate Cloth  6 each Topical Daily   clopidogrel  75 mg Oral Q breakfast   feeding supplement  237 mL Oral BID BM   gabapentin  300 mg Oral QHS   insulin aspart  2-6 Units Subcutaneous Q4H   mouth rinse  15 mL Mouth Rinse BID   multivitamin with minerals  1 tablet Oral Daily   pantoprazole (PROTONIX) IV  40 mg Intravenous Q24H   sodium chloride flush  10-40 mL Intracatheter Q12H   sodium chloride flush  3 mL Intravenous Q12H   Zinc Oxide   Topical Daily   Continuous Infusions:  sodium chloride     heparin 700 Units/hr (04/02/21 0700)   lactated ringers 75 mL/hr at 04/02/21 0700   milrinone 0.375 mcg/kg/min (04/02/21 0700)   norepinephrine (LEVOPHED) Adult infusion 18 mcg/min (04/02/21 0700)   vasopressin 0.04 Units/min (04/02/21 0700)   PRN Meds: sodium chloride, sodium chloride, HYDROcodone-acetaminophen, lip balm, ondansetron (ZOFRAN) IV, sodium chloride flush, sodium chloride flush   Vital Signs    Vitals:   04/02/21 0439 04/02/21 0500 04/02/21 0600 04/02/21 0700  BP:  (!) 122/101 (!) 125/97 123/87  Pulse: 90 95 88 87  Resp: 15 15 15 13   Temp: 97.88 F (36.6 C) 97.88 F (36.6 C) 97.88 F (36.6 C) 98.06 F (36.7 C)  TempSrc:      SpO2: 93% 93% 94% 96%  Weight:  67.4 kg    Height:        Intake/Output Summary (Last 24 hours) at 04/02/2021 0741 Last data filed at 04/02/2021 0700 Gross per 24 hour  Intake 3383.09 ml  Output 1128 ml  Net 2255.09 ml   Last 3 Weights 04/02/2021 04/01/2021 04/10/2021  Weight (lbs) 148 lb 9.4 oz 138 lb 0.1 oz 140 lb  Weight (kg) 67.4 kg 62.6 kg 63.504 kg      Telemetry     Atrial fib, PVCs- Personally Reviewed  ECG     No AM EKG- Personally Reviewed  Physical Exam    General: Thin male in NAD.   HEENT: OP clear, mucus membranes moist  SKIN: warm, dry. No rashes. Neuro: No focal deficits  Musculoskeletal: Muscle strength 5/5 all ext  Psychiatric: Mood and affect normal  Neck: No JVD, no carotid bruits, no thyromegaly, no lymphadenopathy.  Lungs:Clear bilaterally, no wheezes, rhonci, crackles Cardiovascular: Regular rate and rhythm. No murmurs, gallops or rubs. Abdomen:Soft. Bowel sounds present. Non-tender.  Extremities: No lower extremity edema.    Labs    High Sensitivity Troponin:   Recent Labs  Lab 04/11/2021 1632 04/13/2021 2155  TROPONINIHS 19,869* >24,000*      Chemistry Recent Labs  Lab 03/30/2021 1632 04/02/2021 1652 04/24/2021 2155 04/01/21 0320 04/02/21 0315  NA 134*   < > 130* 132* 132*  K 4.8   < > 4.5 4.6 4.0  CL 101   < > 100 103 104  CO2 22  --  20* 19* 21*  GLUCOSE 126*   < > 220* 190* 116*  BUN 28*   < > 28* 28* 15  CREATININE 2.02*   < > 2.04* 1.75* 0.97  CALCIUM 8.9  --  8.1* 8.3* 8.0*  PROT 6.9  --   --   --  5.1*  ALBUMIN 2.6*  --   --   --  2.0*  AST 113*  --   --   --  80*  ALT 73*  --   --   --  53*  ALKPHOS 234*  --   --   --  192*  BILITOT 1.4*  --   --   --  1.3*  GFRNONAA 37*  --  36* 44* >60  ANIONGAP 11  --  10 10 7    < > = values in this interval not displayed.     Hematology Recent Labs  Lab 04/06/2021 2155 04/01/21 0320 04/02/21 0315  WBC 17.6* 15.5* 9.5  RBC 3.86* 3.60* 2.82*  HGB 12.6* 11.7* 9.2*  HCT 38.4* 35.8* 27.6*  MCV 99.5 99.4 97.9  MCH 32.6 32.5 32.6  MCHC 32.8 32.7 33.3  RDW 15.6* 15.3 15.3  PLT 230 211 157    BNPNo results for input(s): BNP, PROBNP in the last 168 hours.   DDimer No results for input(s): DDIMER in the last 168 hours.   Radiology    CARDIAC CATHETERIZATION  Result Date: 04/03/2021  Prox RCA to Mid RCA lesion is 100% stenosed.  Previously placed Prox  Cx to Mid Cx stent (unknown type) is widely patent.  Dist LAD lesion is 70% stenosed.  Mid LAD lesion is 40% stenosed.  A drug-eluting stent was successfully placed using a SYNERGY XD 2.75X32.  Post intervention, there is a 0% residual stenosis.  1. Acute inferior ST elevation MI secondary to thrombotic occlusion of the RCA 2. Successful PTCA/aspiration thrombectomy/stenting of the proximal to mid RCA 3. Patent mid Circumflex stent with minimal restenosis 4. Mild to moderate mid LAD stenosis. Moderately severe very distal LAD stenosis that is unchanged from last cath. 5. Cardiogenic shock. IABP in place. Levophed drip at high dose. Recommendation: Will admit to the ICU. IABP at 1:1. IV heparin tonight when ACT under 180. Continue DAPT with ASA and Plavix for one month. Will resume Pradaxa when his IABP has been removed. High intensity statin. Beta blocker on hold with hypotension. Aggrastat drip for two hours post PCI. Echo tomorrow. PCCM present in cath lab and will follow in the ICU also.   DG Chest Port 1 View  Result Date: 04/02/2021 CLINICAL DATA:  Status post placement of balloon pump. EXAM: PORTABLE CHEST 1 VIEW COMPARISON:  Chest x-ray 04/06/2021. FINDINGS: Left-sided single-lumen subclavian power porta cath with tip terminating in the distal superior vena cava. New small bore catheter projecting over the upper abdomen extending into the left main pulmonary artery, presumably a Swan-Ganz catheter placed via femoral approach. In addition, a marker from an intra-aortic balloon pump is noted projecting to the left side of the T6 vertebra, presumably in the proximal descending thoracic aorta. Moderate to large right pleural effusion with extensive areas of atelectasis and/or consolidation in the base of the right lung, increased compared to the prior study. Left lung is clear. No left pleural effusion. No pneumothorax. Cephalization of the pulmonary vasculature, without frank pulmonary edema.  Emphysematous changes are noted throughout the lungs. Cardiac silhouette is partially obscured, but heart size appears borderline enlarged. Upper mediastinal contours are within normal limits. IMPRESSION: 1. Support apparatus, as above. 2. Increasing moderate to large right pleural effusion with worsening aeration throughout the right mid to lower lung which may reflect areas of worsening atelectasis and/or  consolidation. 3. Cephalization of the pulmonary vasculature, without frank pulmonary edema. Electronically Signed   By: Vinnie Langton M.D.   On: 04/02/2021 06:29   DG Chest Port 1 View  Result Date: 04/01/2021 CLINICAL DATA:  Shortness of breath. EXAM: PORTABLE CHEST 1 VIEW COMPARISON:  06/05/2020 FINDINGS: Moderate right pleural effusion with right mid and lower lung collapse/consolidative opacity. No focal consolidation in the left lung. Interstitial markings are diffusely coarsened with chronic features. Left Port-A-Cath noted. Telemetry leads overlie the chest. IMPRESSION: Moderate right pleural effusion with right mid and lower lung collapse/consolidative opacity. Electronically Signed   By: Misty Stanley M.D.   On: 04/05/2021 17:33   ECHOCARDIOGRAM COMPLETE  Result Date: 04/01/2021    ECHOCARDIOGRAM REPORT   Patient Name:   Juan Wheeler Menter Date of Exam: 04/01/2021 Medical Rec #:  [id number]       Height:       69.0 in Accession #:    [id number]      Weight:       138.0 lb Date of Birth:  [birth year]       BSA:          1.765 m Patient Age:    89 years        BP:           90/65 mmHg Patient Gender: M               HR:           90 bpm. Exam Location:  Inpatient Procedure: 2D Echo, Cardiac Doppler and Color Doppler Indications:    CAD Native Vessel I25.10  History:        Patient has prior history of Echocardiogram examinations, most                 recent 01/17/2021. Cardiomyopathy, CAD and Previous Myocardial                 Infarction; Risk Factors:Hypertension.  Sonographer:    Bernadene Person RDCS  Referring Phys: [place]  1. Left ventricular ejection fraction, by estimation, is 30 to 35%. The left ventricle has moderately decreased function. The left ventricle demonstrates regional wall motion abnormalities (lateral hypokinesis). Left ventricular diastolic parameters are  indeterminate. There is the interventricular septum is flattened in systole and diastole, consistent with right ventricular pressure and volume overload.  2. Right ventricular systolic function is mildly reduced. The right ventricular size is mildly enlarged. Tricuspid regurgitation signal is inadequate for assessing PA pressure.  3. The mitral valve is normal in structure. No evidence of mitral valve regurgitation. No evidence of mitral stenosis.  4. The aortic valve was not well visualized. Aortic valve regurgitation is not visualized. No aortic stenosis is present.  5. The inferior vena cava is normal in size with greater than 50% respiratory variability, suggesting right atrial pressure of 3 mmHg. Comparison(s): A prior study was performed on 01/17/21. Prior images reviewed side by side. Device is now in RV; RV function appears slightly worse (but this is a technically difficult study). FINDINGS  Left Ventricle: Left ventricular ejection fraction, by estimation, is 30 to 35%. The left ventricle has moderately decreased function. The left ventricle demonstrates regional wall motion abnormalities. The left ventricular internal cavity size was normal in size. There is no left ventricular hypertrophy. The interventricular septum is flattened in systole and diastole, consistent with right ventricular pressure and volume overload. Left ventricular diastolic parameters are indeterminate.  LV Wall Scoring:  The antero-lateral wall and posterior wall are hypokinetic. Right Ventricle: The right ventricular size is mildly enlarged. No increase in right ventricular wall thickness. Right ventricular systolic function is  mildly reduced. Tricuspid regurgitation signal is inadequate for assessing PA pressure. The tricuspid regurgitant velocity is 2.45 m/s, and with an assumed right atrial pressure of 3 mmHg, the estimated right ventricular systolic pressure is 19.3 mmHg. Left Atrium: Left atrial size was normal in size. Right Atrium: Right atrial size was normal in size. Pericardium: There is no evidence of pericardial effusion. Mitral Valve: The mitral valve is normal in structure. No evidence of mitral valve regurgitation. No evidence of mitral valve stenosis. Tricuspid Valve: The tricuspid valve is grossly normal. Tricuspid valve regurgitation is mild . No evidence of tricuspid stenosis. Aortic Valve: The aortic valve was not well visualized. Aortic valve regurgitation is not visualized. No aortic stenosis is present. Pulmonic Valve: The pulmonic valve was not well visualized. Pulmonic valve regurgitation is not visualized. No evidence of pulmonic stenosis. Aorta: The aortic root is normal in size and structure. Venous: The inferior vena cava is normal in size with greater than 50% respiratory variability, suggesting right atrial pressure of 3 mmHg. IAS/Shunts: The atrial septum is grossly normal. Additional Comments: A venous catheter is visualized.  LEFT VENTRICLE PLAX 2D LVIDd:         4.30 cm     Diastology LVIDs:         3.40 cm     LV e' medial:    7.21 cm/s LV PW:         0.90 cm     LV E/e' medial:  13.1 LV IVS:        0.90 cm     LV e' lateral:   9.28 cm/s LVOT diam:     2.10 cm     LV E/e' lateral: 10.2 LV SV:         47 LV SV Index:   26 LVOT Area:     3.46 cm  LV Volumes (MOD) LV vol d, MOD A2C: 96.6 ml LV vol d, MOD A4C: 69.5 ml LV vol s, MOD A2C: 55.1 ml LV vol s, MOD A4C: 37.1 ml LV SV MOD A2C:     41.5 ml LV SV MOD A4C:     69.5 ml LV SV MOD BP:      39.3 ml RIGHT VENTRICLE RV S prime:     9.87 cm/s TAPSE (M-mode): 0.9 cm LEFT ATRIUM             Index       RIGHT ATRIUM           Index LA diam:        3.60 cm 2.04  cm/m  RA Area:     15.60 cm LA Vol (A2C):   27.9 ml 15.81 ml/m RA Volume:   44.70 ml  25.33 ml/m LA Vol (A4C):   22.2 ml 12.58 ml/m LA Biplane Vol: 27.0 ml 15.30 ml/m  AORTIC VALVE LVOT Vmax:   91.20 cm/s LVOT Vmean:  55.300 cm/s LVOT VTI:    0.135 m  AORTA Ao Root diam: 3.30 cm Ao Asc diam:  3.50 cm MITRAL VALVE               TRICUSPID VALVE MV Area (PHT): 4.49 cm    TR Peak grad:   24.0 mmHg MV Decel Time: 169 msec    TR Vmax:  245.00 cm/s MV E velocity: 94.30 cm/s MV A velocity: 60.00 cm/s  SHUNTS MV E/A ratio:  1.57        Systemic VTI:  0.14 m                            Systemic Diam: 2.10 cm Rudean Haskell MD Electronically signed by Rudean Haskell MD Signature Date/Time: 04/01/2021/2:51:52 PM    Final     Cardiac Studies   Echo above Cardiac cath above  Patient Profile     60 y.o. male presenting with an acute inferolateral STEMI complicated by cardiogenic shock.  The patient has multiple severe comorbid medical conditions with history of DVT/PE on chronic anticoagulation, PAF, recent stroke, lung cancer, and colorectal cancer. Known CAD with prior stenting of the Circumflex. Emergent cardiac cath with thrombotic occlusion of the RCA. 3 drug eluting stents placed in the RCA. IABP placed 04/25/2021.   Assessment & Plan    1.  Acute inferolateral STEMI: Echo reviewed. LVEF=30-35% which is his baseline. Will continue DAPT with ASA/Plavix. Will plan to stop ASA in one month. He was treated with overlapping drug eluting stents in the RCA. He is not a candidate for ACE/ARB/beta-blocker in the setting of shock.  2.  Cardiogenic shock: BP is improving with current therapy. CVP around 12. I have reviewed his medications with our Advanced Heart Failure team. Would begin weaning Levophed and Vasopressin today. Will continue with milrinone and IABP at 1:1.  3.  Acute on chronic kidney injury: Renal function back to baseline.   4.  Paroxysmal atrial fibrillation: Rate controlled He is  on IV heparin.   5.  DVT/PE by history: Continue heparin until we can restart Pradaxa.    For questions or updates, please contact [place] Please consult [email or web address] for contact info under        Signed, Lauree Chandler, MD  04/02/2021, 7:41 AM

## 2021-04-03 ENCOUNTER — Inpatient Hospital Stay (HOSPITAL_COMMUNITY): Payer: Medicare PPO

## 2021-04-03 DIAGNOSIS — Z9889 Other specified postprocedural states: Secondary | ICD-10-CM

## 2021-04-03 DIAGNOSIS — I2510 Atherosclerotic heart disease of native coronary artery without angina pectoris: Secondary | ICD-10-CM | POA: Diagnosis not present

## 2021-04-03 DIAGNOSIS — N184 Chronic kidney disease, stage 4 (severe): Secondary | ICD-10-CM

## 2021-04-03 DIAGNOSIS — I213 ST elevation (STEMI) myocardial infarction of unspecified site: Secondary | ICD-10-CM | POA: Diagnosis not present

## 2021-04-03 DIAGNOSIS — J9602 Acute respiratory failure with hypercapnia: Secondary | ICD-10-CM | POA: Diagnosis not present

## 2021-04-03 DIAGNOSIS — I480 Paroxysmal atrial fibrillation: Secondary | ICD-10-CM

## 2021-04-03 DIAGNOSIS — C3491 Malignant neoplasm of unspecified part of right bronchus or lung: Secondary | ICD-10-CM

## 2021-04-03 DIAGNOSIS — E43 Unspecified severe protein-calorie malnutrition: Secondary | ICD-10-CM

## 2021-04-03 DIAGNOSIS — J9601 Acute respiratory failure with hypoxia: Secondary | ICD-10-CM | POA: Diagnosis not present

## 2021-04-03 DIAGNOSIS — R570 Cardiogenic shock: Secondary | ICD-10-CM

## 2021-04-03 LAB — CYTOLOGY - NON PAP

## 2021-04-03 LAB — CBC WITH DIFFERENTIAL/PLATELET
Abs Immature Granulocytes: 0.04 10*3/uL (ref 0.00–0.07)
Basophils Absolute: 0 10*3/uL (ref 0.0–0.1)
Basophils Relative: 0 %
Eosinophils Absolute: 0.2 10*3/uL (ref 0.0–0.5)
Eosinophils Relative: 2 %
HCT: 26.4 % — ABNORMAL LOW (ref 39.0–52.0)
Hemoglobin: 8.7 g/dL — ABNORMAL LOW (ref 13.0–17.0)
Immature Granulocytes: 0 %
Lymphocytes Relative: 8 %
Lymphs Abs: 0.7 10*3/uL (ref 0.7–4.0)
MCH: 32.3 pg (ref 26.0–34.0)
MCHC: 33 g/dL (ref 30.0–36.0)
MCV: 98.1 fL (ref 80.0–100.0)
Monocytes Absolute: 1.1 10*3/uL — ABNORMAL HIGH (ref 0.1–1.0)
Monocytes Relative: 12 %
Neutro Abs: 7.1 10*3/uL (ref 1.7–7.7)
Neutrophils Relative %: 78 %
Platelets: 148 10*3/uL — ABNORMAL LOW (ref 150–400)
RBC: 2.69 MIL/uL — ABNORMAL LOW (ref 4.22–5.81)
RDW: 15.6 % — ABNORMAL HIGH (ref 11.5–15.5)
WBC: 9.1 10*3/uL (ref 4.0–10.5)
nRBC: 0 % (ref 0.0–0.2)

## 2021-04-03 LAB — COMPREHENSIVE METABOLIC PANEL
ALT: 51 U/L — ABNORMAL HIGH (ref 0–44)
AST: 70 U/L — ABNORMAL HIGH (ref 15–41)
Albumin: 1.9 g/dL — ABNORMAL LOW (ref 3.5–5.0)
Alkaline Phosphatase: 260 U/L — ABNORMAL HIGH (ref 38–126)
Anion gap: 4 — ABNORMAL LOW (ref 5–15)
BUN: 14 mg/dL (ref 8–23)
CO2: 25 mmol/L (ref 22–32)
Calcium: 7.9 mg/dL — ABNORMAL LOW (ref 8.9–10.3)
Chloride: 102 mmol/L (ref 98–111)
Creatinine, Ser: 0.97 mg/dL (ref 0.61–1.24)
GFR, Estimated: >60 mL/min (ref >60)
Glucose, Bld: 95 mg/dL (ref 70–99)
Potassium: 3.9 mmol/L (ref 3.5–5.1)
Sodium: 131 mmol/L — ABNORMAL LOW (ref 135–145)
Total Bilirubin: 0.9 mg/dL (ref 0.3–1.2)
Total Protein: 5.2 g/dL — ABNORMAL LOW (ref 6.5–8.1)

## 2021-04-03 LAB — COOXEMETRY PANEL
Carboxyhemoglobin: 0.9 % (ref 0.5–1.5)
Methemoglobin: 0.8 % (ref 0.0–1.5)
O2 Saturation: 53.9 %
Total hemoglobin: 10.5 g/dL — ABNORMAL LOW (ref 12.0–16.0)

## 2021-04-03 LAB — GLUCOSE, CAPILLARY
Glucose-Capillary: 86 mg/dL (ref 70–99)
Glucose-Capillary: 118 mg/dL — ABNORMAL HIGH (ref 70–99)
Glucose-Capillary: 132 mg/dL — ABNORMAL HIGH (ref 70–99)
Glucose-Capillary: 143 mg/dL — ABNORMAL HIGH (ref 70–99)
Glucose-Capillary: 166 mg/dL — ABNORMAL HIGH (ref 70–99)

## 2021-04-03 LAB — PH, BODY FLUID: pH, Body Fluid: 7.6

## 2021-04-03 LAB — HEPARIN LEVEL (UNFRACTIONATED): Heparin Unfractionated: 0.35 IU/mL (ref 0.30–0.70)

## 2021-04-03 MED ORDER — POTASSIUM CHLORIDE 20 MEQ PO PACK
20.0000 meq | PACK | Freq: Once | ORAL | Status: AC
  Administered 2021-04-03: 20 meq via ORAL
  Filled 2021-04-03: qty 1

## 2021-04-03 MED ORDER — FUROSEMIDE 10 MG/ML IJ SOLN
80.0000 mg | Freq: Once | INTRAMUSCULAR | Status: AC
  Administered 2021-04-03: 80 mg via INTRAVENOUS
  Filled 2021-04-03: qty 8

## 2021-04-03 MED ORDER — FUROSEMIDE 10 MG/ML IJ SOLN
80.0000 mg | Freq: Two times a day (BID) | INTRAMUSCULAR | Status: DC
  Administered 2021-04-03: 80 mg via INTRAVENOUS
  Filled 2021-04-03: qty 8

## 2021-04-03 MED FILL — Tirofiban HCl in NaCl 0.9% IV Soln 5 MG/100ML (Base Equiv): INTRAVENOUS | Qty: 100 | Status: AC

## 2021-04-03 NOTE — Progress Notes (Signed)
ANTICOAGULATION CONSULT NOTE   Pharmacy Consult for heparin Indication:  IABP  and h/o Afib  Allergies  Allergen Reactions   Tramadol Other (See Comments)    Felt sluggish and ineffective    Darvocet [Propoxyphene N-Acetaminophen] Palpitations    Patient Measurements: Height: 5\' 9"  (175.3 cm) Weight: 67.4 kg (148 lb 9.4 oz) IBW/kg (Calculated) : 70.7 Heparin Dosing Weight: 63.5 kg   Vital Signs: Temp: 97.7 F (36.5 C) (07/06 2300) Temp Source: Core (07/06 1600) BP: 129/93 (07/06 2300) Pulse Rate: 175 (07/06 2300)  Labs: Recent Labs    04/12/2021 1632 04/21/2021 1652 04/03/2021 2155 04/01/21 0320 04/01/21 1800 04/02/21 0315 04/02/21 2332  HGB 13.5   < > 12.6* 11.7*  --  9.2* 9.1*  HCT 40.9   < > 38.4* 35.8*  --  27.6* 27.0*  PLT 190  --  230 211  --  157 152  HEPARINUNFRC  --   --   --   --  <0.10* <0.10* 0.14*  CREATININE 2.02*   < > 2.04* 1.75*  --  0.97  --   TROPONINIHS 03,546*  --  >24,000*  --   --   --   --    < > = values in this interval not displayed.     Estimated Creatinine Clearance: 76.2 mL/min (by C-G formula based on SCr of 0.97 mg/dL).   Assessment: 60 y.o. male admitted with STEMI s/p PCI, IABP, h/o Afib and Pradaxa on hold, for heparin.  Heparin restarted s/p thoracentesis 7/6 at 1630   Heparin level 0.14 (subtherapeutic) on gtt at 700 units/hr. No issues with line or bleeding reported per RN. Hgb 9.1 (down from 12), stable over past 12 hours.  Goal of Therapy:  Heparin level 0.3-0.5 units/ml Monitor platelets by anticoagulation protocol: Yes   Plan:  Increase Heparin to 850 units/hr  Check heparin level in 6 hours  Sherlon Handing, PharmD, BCPS Please see amion for complete clinical pharmacist phone list 04/03/2021 12:09 AM

## 2021-04-03 NOTE — Progress Notes (Addendum)
This chaplain responded to PMT NP-Eric [place] page to join the Pt. and family in the goals of care meeting.  The Pt. wife-[name] is at the bedside, along with other family members who are visiting the Pt. three at a time.  The chaplain listened reflectively as [place] described the Pt. joyful spirit and willingness to be present with family and friends. The family continued to lift up the Pt. as they listened to the medical team.   The chaplain will continue to offer spiritual care to the Pt., family, and medical team as they navigate time with the Pt. The family accepted the chaplain's invitation for prayer.  **[id number]  The chaplain understands permission was given from both units for a visit by the Pt. daughter and newborn grandson.  This chaplain is appreciative of the teamwork by the [facility] chaplain-Amanda during this visit.   The chaplain understands the Pt. is tired and prefers to rest. The chaplain supported the RN as a reflective listener with the Pt. family as they learned of the Pt. choice to be without visitors.   This chaplain is available for F/U spiritual care as needed, [phone].

## 2021-04-03 NOTE — Progress Notes (Signed)
Progress Note  Patient Name: Juan Wheeler Date of Encounter: 04/03/2021  [facility] Cardiologist: Kate Sable, MD (Inactive) [facility]  Subjective   No complaints this am.   Inpatient Medications    Scheduled Meds:  aspirin EC  81 mg Oral Daily   atorvastatin  80 mg Oral Daily   Chlorhexidine Gluconate Cloth  6 each Topical Daily   clopidogrel  75 mg Oral Q breakfast   feeding supplement  237 mL Oral BID BM   gabapentin  300 mg Oral QHS   insulin aspart  2-6 Units Subcutaneous Q4H   mouth rinse  15 mL Mouth Rinse BID   multivitamin with minerals  1 tablet Oral Daily   pantoprazole (PROTONIX) IV  40 mg Intravenous Q24H   sodium chloride flush  10-40 mL Intracatheter Q12H   sodium chloride flush  3 mL Intravenous Q12H   Zinc Oxide   Topical Daily   Continuous Infusions:  sodium chloride 250 mL (04/02/21 1818)   amiodarone 30 mg/hr (04/03/21 0639)   heparin 850 Units/hr (04/03/21 0600)   lactated ringers 75 mL/hr at 04/03/21 0600   milrinone 0.375 mcg/kg/min (04/03/21 0600)   norepinephrine (LEVOPHED) Adult infusion 14 mcg/min (04/03/21 0600)   vasopressin 0.03 Units/min (04/03/21 0600)   PRN Meds: sodium chloride, sodium chloride, HYDROcodone-acetaminophen, lip balm, ondansetron (ZOFRAN) IV, sodium chloride flush, sodium chloride flush   Vital Signs    Vitals:   04/03/21 0352 04/03/21 0400 04/03/21 0500 04/03/21 0600  BP:  (!) 131/95 139/89 (!) 145/105  Pulse: 90 (!) 149 (!) 115 (!) 129  Resp: 12 11 12 12   Temp: 97.7 F (36.5 C) 97.7 F (36.5 C) 97.9 F (36.6 C) 98.06 F (36.7 C)  TempSrc:  Core    SpO2: 94% 93% 96% 99%  Weight:   70.7 kg   Height:        Intake/Output Summary (Last 24 hours) at 04/03/2021 0706 Last data filed at 04/03/2021 0600 Gross per 24 hour  Intake 3147.33 ml  Output 940 ml  Net 2207.33 ml   Last 3 Weights 04/03/2021 04/02/2021 04/01/2021  Weight (lbs) 155 lb 13.8 oz 148 lb 9.4 oz 138 lb 0.1 oz  Weight (kg)  70.7 kg 67.4 kg 62.6 kg      Telemetry    Atrial fib, elevated rates overnight- Personally Reviewed  ECG     No AM EKG- Personally Reviewed  Physical Exam    General: thin male in NAD HEENT: OP clear, mucus membranes moist  SKIN: warm, dry.  Neuro: No focal deficits  Musculoskeletal: Muscle strength 5/5 all ext  Psychiatric: flat affect Neck: +JVD Lungs:Clear bilaterally, no wheezes, rhonci, crackles Cardiovascular: Irreg irreg. No murmurs, gallops or rubs. Abdomen:Soft. Bowel sounds present. Non-tender.  Extremities: Trace lower extremity edema.   Labs    High Sensitivity Troponin:   Recent Labs  Lab 04/17/2021 1632 04/26/2021 2155  TROPONINIHS 19,869* >24,000*      Chemistry Recent Labs  Lab 04/14/2021 1632 04/10/2021 1652 04/01/21 0320 04/02/21 0315 04/03/21 0331  NA 134*   < > 132* 132* 131*  K 4.8   < > 4.6 4.0 3.9  CL 101   < > 103 104 102  CO2 22   < > 19* 21* 25  GLUCOSE 126*   < > 190* 116* 95  BUN 28*   < > 28* 15 14  CREATININE 2.02*   < > 1.75* 0.97 0.97  CALCIUM 8.9   < >  8.3* 8.0* 7.9*  PROT 6.9  --   --  5.1* 5.2*  ALBUMIN 2.6*  --   --  2.0* 1.9*  AST 113*  --   --  80* 70*  ALT 73*  --   --  53* 51*  ALKPHOS 234*  --   --  192* 260*  BILITOT 1.4*  --   --  1.3* 0.9  GFRNONAA 37*   < > 44* >60 >60  ANIONGAP 11   < > 10 7 4*   < > = values in this interval not displayed.     Hematology Recent Labs  Lab 04/02/21 0315 04/02/21 2332 04/03/21 0331  WBC 9.5 8.9 9.1  RBC 2.82* 2.75* 2.69*  HGB 9.2* 9.1* 8.7*  HCT 27.6* 27.0* 26.4*  MCV 97.9 98.2 98.1  MCH 32.6 33.1 32.3  MCHC 33.3 33.7 33.0  RDW 15.3 15.5 15.6*  PLT 157 152 148*    BNPNo results for input(s): BNP, PROBNP in the last 168 hours.   DDimer No results for input(s): DDIMER in the last 168 hours.   Radiology    DG Chest Port 1 View  Result Date: 04/02/2021 CLINICAL DATA:  Status post thoracentesis EXAM: PORTABLE CHEST 1 VIEW COMPARISON:  Radiograph earlier today.  FINDINGS: Decreased size of right pleural effusion post thoracentesis. No visualized pneumothorax. Residual patchy opacity in the right mid lower lung zone. Left chest port remains in place. Stable catheter extending from inferior approach projecting over the region of the left pulmonary artery. Probable marker from intra-aortic balloon pump again seen. Stable heart size. Emphysema with chronic interstitial coarsening in the left lung. IMPRESSION: 1. Decreased size of right pleural effusion post thoracentesis. No visualized pneumothorax. 2. Residual patchy opacity in the right mid and lower lung zone, unchanged. 3. Otherwise stable exam from earlier today. Electronically Signed   By: Keith Rake M.D.   On: 04/02/2021 17:17   DG Chest Port 1 View  Result Date: 04/02/2021 CLINICAL DATA:  Status post placement of balloon pump. EXAM: PORTABLE CHEST 1 VIEW COMPARISON:  Chest x-ray 04/16/2021. FINDINGS: Left-sided single-lumen subclavian power porta cath with tip terminating in the distal superior vena cava. New small bore catheter projecting over the upper abdomen extending into the left main pulmonary artery, presumably a Swan-Ganz catheter placed via femoral approach. In addition, a marker from an intra-aortic balloon pump is noted projecting to the left side of the T6 vertebra, presumably in the proximal descending thoracic aorta. Moderate to large right pleural effusion with extensive areas of atelectasis and/or consolidation in the base of the right lung, increased compared to the prior study. Left lung is clear. No left pleural effusion. No pneumothorax. Cephalization of the pulmonary vasculature, without frank pulmonary edema. Emphysematous changes are noted throughout the lungs. Cardiac silhouette is partially obscured, but heart size appears borderline enlarged. Upper mediastinal contours are within normal limits. IMPRESSION: 1. Support apparatus, as above. 2. Increasing moderate to large right pleural  effusion with worsening aeration throughout the right mid to lower lung which may reflect areas of worsening atelectasis and/or consolidation. 3. Cephalization of the pulmonary vasculature, without frank pulmonary edema. Electronically Signed   By: Vinnie Langton M.D.   On: 04/02/2021 06:29   ECHOCARDIOGRAM COMPLETE  Result Date: 04/01/2021    ECHOCARDIOGRAM REPORT   Patient Name:   Juan Wheeler Date of Exam: 04/01/2021 Medical Rec #:  [id number]       Height:       69.0 in  Accession #:    [id number]      Weight:       138.0 lb Date of Birth:  1961/03/18       BSA:          1.765 m Patient Age:    60 years        BP:           90/65 mmHg Patient Gender: M               HR:           90 bpm. Exam Location:  Inpatient Procedure: 2D Echo, Cardiac Doppler and Color Doppler Indications:    CAD Native Vessel I25.10  History:        Patient has prior history of Echocardiogram examinations, most                 recent 01/17/2021. Cardiomyopathy, CAD and Previous Myocardial                 Infarction; Risk Factors:Hypertension.  Sonographer:    Bernadene Person RDCS Referring Phys: [place]  1. Left ventricular ejection fraction, by estimation, is 30 to 35%. The left ventricle has moderately decreased function. The left ventricle demonstrates regional wall motion abnormalities (lateral hypokinesis). Left ventricular diastolic parameters are  indeterminate. There is the interventricular septum is flattened in systole and diastole, consistent with right ventricular pressure and volume overload.  2. Right ventricular systolic function is mildly reduced. The right ventricular size is mildly enlarged. Tricuspid regurgitation signal is inadequate for assessing PA pressure.  3. The mitral valve is normal in structure. No evidence of mitral valve regurgitation. No evidence of mitral stenosis.  4. The aortic valve was not well visualized. Aortic valve regurgitation is not visualized. No aortic  stenosis is present.  5. The inferior vena cava is normal in size with greater than 50% respiratory variability, suggesting right atrial pressure of 3 mmHg. Comparison(s): A prior study was performed on 01/17/21. Prior images reviewed side by side. Device is now in RV; RV function appears slightly worse (but this is a technically difficult study). FINDINGS  Left Ventricle: Left ventricular ejection fraction, by estimation, is 30 to 35%. The left ventricle has moderately decreased function. The left ventricle demonstrates regional wall motion abnormalities. The left ventricular internal cavity size was normal in size. There is no left ventricular hypertrophy. The interventricular septum is flattened in systole and diastole, consistent with right ventricular pressure and volume overload. Left ventricular diastolic parameters are indeterminate.  LV Wall Scoring: The antero-lateral wall and posterior wall are hypokinetic. Right Ventricle: The right ventricular size is mildly enlarged. No increase in right ventricular wall thickness. Right ventricular systolic function is mildly reduced. Tricuspid regurgitation signal is inadequate for assessing PA pressure. The tricuspid regurgitant velocity is 2.45 m/s, and with an assumed right atrial pressure of 3 mmHg, the estimated right ventricular systolic pressure is 13.0 mmHg. Left Atrium: Left atrial size was normal in size. Right Atrium: Right atrial size was normal in size. Pericardium: There is no evidence of pericardial effusion. Mitral Valve: The mitral valve is normal in structure. No evidence of mitral valve regurgitation. No evidence of mitral valve stenosis. Tricuspid Valve: The tricuspid valve is grossly normal. Tricuspid valve regurgitation is mild . No evidence of tricuspid stenosis. Aortic Valve: The aortic valve was not well visualized. Aortic valve regurgitation is not visualized. No aortic stenosis is present. Pulmonic Valve: The pulmonic valve was not  well  visualized. Pulmonic valve regurgitation is not visualized. No evidence of pulmonic stenosis. Aorta: The aortic root is normal in size and structure. Venous: The inferior vena cava is normal in size with greater than 50% respiratory variability, suggesting right atrial pressure of 3 mmHg. IAS/Shunts: The atrial septum is grossly normal. Additional Comments: A venous catheter is visualized.  LEFT VENTRICLE PLAX 2D LVIDd:         4.30 cm     Diastology LVIDs:         3.40 cm     LV e' medial:    7.21 cm/s LV PW:         0.90 cm     LV E/e' medial:  13.1 LV IVS:        0.90 cm     LV e' lateral:   9.28 cm/s LVOT diam:     2.10 cm     LV E/e' lateral: 10.2 LV SV:         47 LV SV Index:   26 LVOT Area:     3.46 cm  LV Volumes (MOD) LV vol d, MOD A2C: 96.6 ml LV vol d, MOD A4C: 69.5 ml LV vol s, MOD A2C: 55.1 ml LV vol s, MOD A4C: 37.1 ml LV SV MOD A2C:     41.5 ml LV SV MOD A4C:     69.5 ml LV SV MOD BP:      39.3 ml RIGHT VENTRICLE RV S prime:     9.87 cm/s TAPSE (M-mode): 0.9 cm LEFT ATRIUM             Index       RIGHT ATRIUM           Index LA diam:        3.60 cm 2.04 cm/m  RA Area:     15.60 cm LA Vol (A2C):   27.9 ml 15.81 ml/m RA Volume:   44.70 ml  25.33 ml/m LA Vol (A4C):   22.2 ml 12.58 ml/m LA Biplane Vol: 27.0 ml 15.30 ml/m  AORTIC VALVE LVOT Vmax:   91.20 cm/s LVOT Vmean:  55.300 cm/s LVOT VTI:    0.135 m  AORTA Ao Root diam: 3.30 cm Ao Asc diam:  3.50 cm MITRAL VALVE               TRICUSPID VALVE MV Area (PHT): 4.49 cm    TR Peak grad:   24.0 mmHg MV Decel Time: 169 msec    TR Vmax:        245.00 cm/s MV E velocity: 94.30 cm/s MV A velocity: 60.00 cm/s  SHUNTS MV E/A ratio:  1.57        Systemic VTI:  0.14 m                            Systemic Diam: 2.10 cm Rudean Haskell MD Electronically signed by Rudean Haskell MD Signature Date/Time: 04/01/2021/2:51:52 PM    Final     Cardiac Studies   Echo above Cardiac cath above  Patient Profile     60 y.o. male presenting with an acute  inferolateral STEMI complicated by cardiogenic shock.  The patient has multiple severe comorbid medical conditions with history of DVT/PE on chronic anticoagulation, PAF, recent stroke, lung cancer, and colorectal cancer. Known CAD with prior stenting of the Circumflex. Emergent cardiac cath with thrombotic occlusion of the RCA. 3 drug eluting stents placed in  the RCA. IABP placed 04/26/2021.   Assessment & Plan    1.  Acute inferolateral STEMI: Echo reviewed. LVEF=30-35% which is his baseline. He was treated with overlapping drug eluting stents in the RCA.Plan for DAPT with ASA and Plavix for one month and will then stop ASA.  He is not a candidate for ACE/ARB/beta-blocker in the setting of shock.  2.  Cardiogenic shock: He remains in shock and continues to require Levophed and milrinone. IABP at 1:1. Vasopressin has been stopped. Co ox worse today. CVP 20-24. Will ask the Advanced Heart Failure team to see him today. Appreciate PCCM team care.   3.  Acute on chronic kidney injury: Renal function back to baseline.   4.  Paroxysmal atrial fibrillation: Atrial fib this am with controlled rate on amiodarone drip. Continue IV heparin.    5.  DVT/PE by history: Continue heparin until we can restart Pradaxa.   For questions or updates, please contact [place] Please consult [email or web address] for contact info under     Signed, Lauree Chandler, MD  04/03/2021, 7:06 AM

## 2021-04-03 NOTE — Progress Notes (Signed)
This chaplain is present for creating or updating Pt. Advance Directive and learned the heart team has shared with the Pt. "there is not anything else we can do."    The chaplain understands Palliative Care has been contacted for goals of care meeting. The ECMO Specialist-Nicole is contacting family.    The chaplain understands the Pt is requesting time alone to process the conversation.  The Pt. accepted the chaplain's invitation for a return spiritual care visit.

## 2021-04-03 NOTE — Plan of Care (Signed)
  Problem: Nutrition: Goal: Adequate nutrition will be maintained Outcome: Progressing   Problem: Elimination: Goal: Will not experience complications related to bowel motility Outcome: Progressing   Problem: Pain Managment: Goal: General experience of comfort will improve 04/03/2021 0114 by Tamlyn Sides, Lendon Ka, RN Outcome: Progressing 04/03/2021 0028 by Clarissia Mckeen, Lendon Ka, RN Outcome: Progressing   Problem: Safety: Goal: Ability to remain free from injury will improve Outcome: Progressing   Problem: Cardiac: Goal: Ability to achieve and maintain adequate cardiopulmonary perfusion will improve 04/03/2021 0114 by Shadee Montoya, Lendon Ka, RN Outcome: Progressing 04/03/2021 0028 by Silvano Rusk, RN Outcome: Progressing Goal: Vascular access site(s) Level 0-1 will be maintained Outcome: Progressing

## 2021-04-03 NOTE — Consult Note (Addendum)
Advanced Heart Failure Team Consult Note   Primary Physician: [name], MD PCP-Cardiologist:  Kate Sable, MD (Inactive)  Reason for Consultation: Cardiogenic Shock   HPI:    Juan Wheeler is seen today for evaluation of cardiogenic shock at the request of Dr Angelena Form.   Juan Wheeler is a 60 year old with a history of  CAD, DVT, bilateral PE 12/2020, GERD, 2012 rectal cancer with diverting ostomy, 2021 Stage IIIA squamous cell lung cancer, adenocarcinoma on liver biopsy 01/2021,  A fib, 01/2021 CVA limited on right, wheelchair bound,   tobacco abuse, chronic perianal fistula, and 12/3327 chronic systolic heart failure.   Prior Therapy for rectal/R lung cancer 1. XRT with Xeloda from 02/03/2011 to 03/13/2011. 2. Lap colostomy on 06/11/2016. 3. Received chemoradiation with weekly carboplatin and paclitaxel from 06/27/2020 to 08/13/2020 09/12/2020 - Chemotherapy     Patient is on Treatment Plan: LUNG DURVALUMAB J18A     Saw Oncologist 12/2020 was set up liver biopsy--->Adenocarcinoma--> nonspecific ? Pancreaticobiliary ? Gastrointestinal  ECHO 12/2020 EF 30-35%. ( ECHO 2020 50-55%)   Pet scan 01/2021 - Signs of hepatic metastatic disease either from lung primary or colonic primary. Celiac nodal disease also noted in the upper abdomen.  Avascular necrosis left femoral head.   Admitted in 12/1658 with acute embolic CVA. Had been on eliquis prior to admit with question of compliance. Discharged to CIR with d/c to home in June.   Presented to [facility] with chest pain in the setting STEMI. Transferred to [facility] and had urgent LHC. PCI to RCA and had IABP placed. Had ECHO with EF 30-35% RV mildly reduced. Post cath he has been on pressors and mechanical support with IABP. Yesterday had pleural effusion on right and underwent thoracentesis with 1200 cc removed. Developed A fib RVR and was started on amio drip. Palliative Care Consulted. He is DNR/DNI.   Remains on IABP 1:1, norepi 14 mcg +  milrinone 0.375 mcg + amio drip. CO-OX 54%. Renal function stable. Vasopressin was weaned off.  Swan #s CVP 17  PA 53/27 SVR 1838  CO 3.2  CI 1.8   Review of Systems: [y] = yes, [ ]  = no   General: Weight gain [ ] ; Weight loss [ ] ; Anorexia [ ] ; Fatigue [Y ]; Fever [ ] ; Chills [ ] ; Weakness [ Y]  Cardiac: Chest pain/pressure [ ] ; Resting SOB [ ] ; Exertional SOB [ Y]; Orthopnea [ ] ; Pedal Edema [ ] ; Palpitations [ ] ; Syncope [ ] ; Presyncope [ ] ; Paroxysmal nocturnal dyspnea[ ]   Pulmonary: Cough [ ] ; Wheezing[ ] ; Hemoptysis[ ] ; Sputum [ ] ; Snoring [ ]   GI: Vomiting[ ] ; Dysphagia[ ] ; Melena[ ] ; Hematochezia [ ] ; Heartburn[ ] ; Abdominal pain [ ] ; Constipation [ ] ; Diarrhea [ ] ; BRBPR [ ]   GU: Hematuria[ ] ; Dysuria [ ] ; Nocturia[ ]   Vascular: Pain in legs with walking [ ] ; Pain in feet with lying flat [ ] ; Non-healing sores [ ] ; Stroke [ Y]; TIA [ ] ; Slurred speech [ ] ;  Neuro: Headaches[ ] ; Vertigo[ ] ; Seizures[ ] ; Paresthesias[ ] ;Blurred vision [ ] ; Diplopia [ ] ; Vision changes [ ]   Ortho/Skin: Arthritis [ ] ; Joint pain [Y ]; Muscle pain [ ] ; Joint swelling [ ] ; Back Pain [Y ]; Rash [ ]   Psych: Depression[ ] ; Anxiety[ ]   Heme: Bleeding problems [ ] ; Clotting disorders [ ] ; Anemia [ ]   Endocrine: Diabetes [ Y]; Thyroid dysfunction[ ]   Home Medications Prior to Admission medications   Medication Sig Start  Date End Date Taking? Authorizing Provider  dabigatran (PRADAXA) 150 MG CAPS capsule Take 1 capsule (150 mg total) by mouth every 12 (twelve) hours. 03/11/21  Yes Angiulli, Lavon Paganini, [name]  gabapentin (NEURONTIN) 600 MG tablet Take 0.5 tablets (300 mg total) by mouth at bedtime. 03/11/21  Yes Angiulli, Lavon Paganini, [name]  HYDROcodone-acetaminophen (NORCO) 10-325 MG tablet Take 1 tablet by mouth every 4 (four) hours as needed. Patient taking differently: Take 1 tablet by mouth every 4 (four) hours as needed for moderate pain. 03/11/21  Yes Angiulli, Lavon Paganini, [name]  metoprolol succinate (TOPROL-XL)  50 MG 24 hr tablet Take 1 tablet (50 mg total) by mouth daily. Take with or immediately following a meal. 03/11/21  Yes Angiulli, Lavon Paganini, [name]  nitroGLYCERIN (NITROSTAT) 0.4 MG SL tablet Place 1 tablet (0.4 mg total) under the tongue every 5 (five) minutes as needed for chest pain. 03/11/21  Yes Angiulli, Lavon Paganini, [name]  omeprazole (PRILOSEC) 40 MG capsule Take 1 capsule (40 mg total) by mouth daily. Patient taking differently: Take 40 mg by mouth daily as needed (acid reflux). 03/11/21  Yes Angiulli, Lavon Paganini, [name]  ondansetron (ZOFRAN-ODT) 4 MG disintegrating tablet Take 4 mg by mouth every 8 (eight) hours as needed for nausea/vomiting. 03/29/21  Yes [provider]  simvastatin (ZOCOR) 40 MG tablet Take 1 tablet (40 mg total) by mouth daily at 6 PM. 03/11/21  Yes Angiulli, Lavon Paganini, [name]  aspirin EC 81 MG tablet Take 81 mg by mouth daily. Patient not taking: Reported on 04/01/2021    [provider]  [facility] IV Inject into the vein every 14 (fourteen) days. 09/12/20   [provider]  prochlorperazine (COMPAZINE) 10 MG tablet Take 1 tablet (10 mg total) by mouth every 6 (six) hours as needed (Nausea or vomiting). 06/26/20 08/16/20  [name], MD    Past Medical History: Past Medical History:  Diagnosis Date   Anticoagulation adequate 04/14/2021   Cardiomyopathy ([place]) 04/21/2021   Chronic lower back pain    a. Followed by pain management at [facility].   Colon cancer [facility])    rectal cancer   Coronary artery disease    a. 03/2013: abnl nuc -> LHC s/p DES to LCx, residual moderate disease in LAD (med rx unless refractory angina). b. Not on BB due to bradycardia.   DVT (deep venous thrombosis) ([place]) ~ 2013   Dysrhythmia    AFib   GERD (gastroesophageal reflux disease)    H/O necrotising fasciitis    History of blood transfusion    "once; after throwing up alot of blood" (04/17/2013)   Hypertension    LV dysfunction    a. EF 45% in 03/2013.   PAD (peripheral  artery disease) ([place])    a. Occlusion of the right internal iliac artery, with significant atherosclerosis in the left internal iliac which was not amenable to reconstruction per notes from [place] in place 05/30/2020   Pulmonary nodules 09/30/2014   Rectal cancer [facility])    STEMI (ST elevation myocardial infarction) ([place]) 04/24/2021   STEMI involving oth coronary artery of inferior wall ([place]) 04/25/2021   Tobacco abuse     Past Surgical History: Past Surgical History:  Procedure Laterality Date   ABDOMINAL SURGERY  1990's   'for stomach ulcers" (04/17/2013)   COLECTOMY  2012   "for rectal cancer" (04/17/2013)   COLONOSCOPY  2013   Dr. Cheryll Cockayne: colorectal anastomosis with ulcer and inflammation, benign biopsy  COLONOSCOPY WITH PROPOFOL N/A 09/07/2016   Procedure: COLONOSCOPY WITH PROPOFOL;  Surgeon: Daneil Dolin, MD;  [facility];  Service: Endoscopy;  Laterality: N/A;  10:00 am Colonoscopy via rectum and ostomy   COLOSTOMY TAKEDOWN  2013   CORONARY ANGIOPLASTY WITH STENT PLACEMENT  04/17/2013   "?1" (04/17/2013)   CORONARY/GRAFT ACUTE MI REVASCULARIZATION N/A 04/13/2021   Procedure: Coronary/Graft Acute MI Revascularization;  Surgeon: Burnell Blanks, MD;  Location: [place] CV LAB;  Service: Cardiovascular;  Laterality: N/A;   FEMORAL-POPLITEAL BYPASS GRAFT Left 07/02/2015   Procedure: BYPASS GRAFT LEFT COMMON FEMORAL ARTERY TO LEFT ABOVE KNEE POPLITEAL ARTERY - USING LEFT GREATER SAPPHENOUS VEIN;  Surgeon: Elam Dutch, MD;  Location: [place];  Service: Vascular;  Laterality: Left;   IABP INSERTION N/A 04/03/2021   Procedure: IABP Insertion;  Surgeon: Burnell Blanks, MD;  Location: [place] CV LAB;  Service: Cardiovascular;  Laterality: N/A;   INCISION AND DRAINAGE ABSCESS Left 06/05/2016   Procedure: INCISION AND DRAINAGE ABSCESS;  Surgeon: Aviva Signs, MD;  Location: [facility] ORS;  Service: General;  Laterality: Left;   INCISION AND  DRAINAGE PERIRECTAL ABSCESS Left 06/07/2016   Procedure: IRRIGATION AND DEBRIDEMENT LEFT BUTTOCK ABSCESS;  Surgeon: Greer Pickerel, MD;  Location: [place];  Service: General;  Laterality: Left;   INGUINAL HERNIA REPAIR Bilateral 1990's   LAPAROSCOPIC PARTIAL COLECTOMY N/A 06/11/2016   Procedure: LAPAROSCOPIC  OPEN COLOSTOMY;  Surgeon: [name], MD;  Location: [place];  Service: General;  Laterality: N/A;   LEFT HEART CATHETERIZATION WITH CORONARY ANGIOGRAM N/A 04/17/2013   Procedure: LEFT HEART CATHETERIZATION WITH CORONARY ANGIOGRAM;  Surgeon: Peter M Martinique, MD;  Location: [facility] CATH LAB;  Service: Cardiovascular;  Laterality: N/A;   PERCUTANEOUS STENT INTERVENTION  04/17/2013   Procedure: PERCUTANEOUS STENT INTERVENTION;  Surgeon: Peter M Martinique, MD;  Location: [facility] CATH LAB;  Service: Cardiovascular;;   PERIPHERAL VASCULAR CATHETERIZATION N/A 06/14/2015   Procedure: Abdominal Aortogram;  Surgeon: Elam Dutch, MD;  Location: [place] CV LAB;  Service: Cardiovascular;  Laterality: N/A;   PERIPHERAL VASCULAR CATHETERIZATION Bilateral 06/14/2015   Procedure: Lower Extremity Angiography;  Surgeon: Elam Dutch, MD;  Location: [place] CV LAB;  Service: Cardiovascular;  Laterality: Bilateral;   PORTACATH PLACEMENT Left 06/05/2020   Procedure: INSERTION PORT-A-CATH;  Surgeon: Aviva Signs, MD;  Location: [facility];  Service: General;  Laterality: Left;   RIGHT/LEFT HEART CATH AND CORONARY ANGIOGRAPHY N/A 04/18/2021   Procedure: RIGHT/LEFT HEART CATH AND CORONARY ANGIOGRAPHY;  Surgeon: Burnell Blanks, MD;  Location: [place] CV LAB;  Service: Cardiovascular;  Laterality: N/A;   VEIN HARVEST Left 07/02/2015   Procedure: VEIN HARVEST - LEFT GREATER SAPPHENOUS VEIN;  Surgeon: Elam Dutch, MD;  Location: [facility] OR;  Service: Vascular;  Laterality: Left;    Family History: Family History  Problem Relation Age of Onset   Cancer Mother    Hypertension Mother    Bleeding Disorder Brother      Social History: Social History   Socioeconomic History   Marital status: Married    Spouse name: Not on file   Number of children: Not on file   Years of education: Not on file   Highest education level: Not on file  Occupational History   Not on file  Tobacco Use   Smoking status: Light Smoker    Packs/day: 0.25    Years: 40.00    Pack years: 10.00    Types: Cigarettes    Start  date: 03/14/1974   Smokeless tobacco: Never   Tobacco comments:    5-6 per day 06/12/15  Vaping Use   Vaping Use: Never used  Substance and Sexual Activity   Alcohol use: Yes    Alcohol/week: 3.0 standard drinks    Types: 3 Cans of beer per week   Drug use: Yes    Types: Marijuana    Comment: 2 days ago    Sexual activity: Yes    Partners: Male    Birth control/protection: None  Other Topics Concern   Not on file  Social History Narrative   Not on file   Social Determinants of Health   Financial Resource Strain: Low Risk    Difficulty of Paying Living Expenses: Not hard at all  Food Insecurity: No Food Insecurity   Worried About Charity fundraiser in the Last Year: Never true   [place] in the Last Year: Never true  Transportation Needs: No Transportation Needs   Lack of Transportation (Medical): No   Lack of Transportation (Non-Medical): No  Physical Activity: Inactive   Days of Exercise per Week: 0 days   Minutes of Exercise per Session: 0 min  Stress: Stress Concern Present   Feeling of Stress : To some extent  Social Connections: Moderately Integrated   Frequency of Communication with Friends and Family: Three times a week   Frequency of Social Gatherings with Friends and Family: Three times a week   Attends Religious Services: 1 to 4 times per year   Active Member of Clubs or Organizations: No   Attends Archivist Meetings: Never   Marital Status: Married    Allergies:  Allergies  Allergen Reactions   Tramadol Other (See Comments)    Felt sluggish  and ineffective    Darvocet [Propoxyphene N-Acetaminophen] Palpitations    Objective:    Vital Signs:   Temp:  [97.52 F (36.4 C)-98.6 F (37 C)] 97.88 F (36.6 C) (07/07 0800) Pulse Rate:  [87-241] 231 (07/07 0715) Resp:  [10-23] 10 (07/07 0800) BP: (98-148)/(76-117) 107/90 (07/07 0800) SpO2:  [73 %-100 %] 95 % (07/07 0800) Arterial Line BP: (94-149)/(55-97) 111/77 (07/07 0800) Weight:  [70.7 kg] 70.7 kg (07/07 0500) Last BM Date: 04/01/21  Weight change: Filed Weights   04/01/21 0413 04/02/21 0500 04/03/21 0500  Weight: 62.6 kg 67.4 kg 70.7 kg    Intake/Output:   Intake/Output Summary (Last 24 hours) at 04/03/2021 0927 Last data filed at 04/03/2021 0600 Gross per 24 hour  Intake 2924.5 ml  Output 940 ml  Net 1984.5 ml      Physical Exam   CVp 18  General:  Appears weak No resp difficulty HEENT: normal Neck: supple. JVP to jaw  . Carotids 2+ bilat; no bruits. No lymphadenopathy or thyromegaly appreciated. Cor: PMI nondisplaced. Irregular rate & rhythm. No rubs, gallops or murmurs. Lungs: Decreased on right.  Abdomen: soft, nontender, nondistended. No hepatosplenomegaly. No bruits or masses. Good bowel sounds. Extremities: no cyanosis, clubbing, rash, edema. RFA IABP Neuro: alert & orientedx3, cranial nerves grossly intact. moves all 4 extremities w/o difficulty. Affect flat GU: foley    Telemetry   Afib 90s   EKG    N/A  Labs   Basic Metabolic Panel: Recent Labs  Lab 04/02/2021 1632 04/17/2021 1652 03/28/2021 1925 04/03/2021 1928 04/04/2021 2155 04/01/21 0320 04/02/21 0315 04/03/21 0331  NA 134* 133*   < > 137 130* 132* 132* 131*  K 4.8 4.8   < >  4.6 4.5 4.6 4.0 3.9  CL 101 102  --   --  100 103 104 102  CO2 22  --   --   --  20* 19* 21* 25  GLUCOSE 126* 120*  --   --  220* 190* 116* 95  BUN 28* 28*  --   --  28* 28* 15 14  CREATININE 2.02* 2.10*  --   --  2.04* 1.75* 0.97 0.97  CALCIUM 8.9  --   --   --  8.1* 8.3* 8.0* 7.9*  MG  --   --   --   --  1.8  1.9  --   --    < > = values in this interval not displayed.    Liver Function Tests: Recent Labs  Lab 04/17/2021 1632 04/02/21 0315 04/03/21 0331  AST 113* 80* 70*  ALT 73* 53* 51*  ALKPHOS 234* 192* 260*  BILITOT 1.4* 1.3* 0.9  PROT 6.9 5.1* 5.2*  ALBUMIN 2.6* 2.0* 1.9*   No results for input(s): LIPASE, AMYLASE in the last 168 hours. No results for input(s): AMMONIA in the last 168 hours.  CBC: Recent Labs  Lab 04/27/2021 1632 04/17/2021 1652 04/03/2021 2155 04/01/21 0320 04/02/21 0315 04/02/21 2332 04/03/21 0331  WBC 15.4*  --  17.6* 15.5* 9.5 8.9 9.1  NEUTROABS 12.8*  --   --   --  7.9*  --  7.1  HGB 13.5   < > 12.6* 11.7* 9.2* 9.1* 8.7*  HCT 40.9   < > 38.4* 35.8* 27.6* 27.0* 26.4*  MCV 98.8  --  99.5 99.4 97.9 98.2 98.1  PLT 190  --  230 211 157 152 148*   < > = values in this interval not displayed.    Cardiac Enzymes: No results for input(s): CKTOTAL, CKMB, CKMBINDEX, TROPONINI in the last 168 hours.  BNP: BNP (last 3 results) Recent Labs    01/16/21 0239 02/15/21 1023  BNP 364.0* 535.0*    ProBNP (last 3 results) No results for input(s): PROBNP in the last 8760 hours.   CBG: Recent Labs  Lab 04/02/21 1631 04/02/21 1951 04/02/21 2331 04/03/21 0327 04/03/21 0741  GLUCAP 117* 165* 123* 86 118*    Coagulation Studies: No results for input(s): LABPROT, INR in the last 72 hours.   Imaging   DG Chest Port 1 View  Result Date: 04/03/2021 CLINICAL DATA:  Code STEMI.  Balloon pump. EXAM: PORTABLE CHEST 1 VIEW COMPARISON:  Chest x-ray 04/02/2021. FINDINGS: PowerPort catheter noted with tip over SVC. Stable position of right inferior approach catheter with tip projected over the left pulmonary artery. Balloon pump marker noted in stable position over the upper descending thoracic aorta. Stable cardiomegaly. Increase interstitial prominence bilaterally, particularly on the right. Findings suggest interstitial edema. Progressive right pleural effusion. No  pneumothorax. IMPRESSION: 1.  Catheter positions including balloon pump in stable position. 2.  Stable cardiomegaly. 3. Increased interstitial prominence bilaterally, particularly on the right. Findings suggest interstitial edema. Progressive right pleural effusion. No pneumothorax. Electronically Signed   By: Marcello Moores  Register   On: 04/03/2021 08:35   DG Chest Port 1 View  Result Date: 04/02/2021 CLINICAL DATA:  Status post thoracentesis EXAM: PORTABLE CHEST 1 VIEW COMPARISON:  Radiograph earlier today. FINDINGS: Decreased size of right pleural effusion post thoracentesis. No visualized pneumothorax. Residual patchy opacity in the right mid lower lung zone. Left chest port remains in place. Stable catheter extending from inferior approach projecting over the region of the left pulmonary  artery. Probable marker from intra-aortic balloon pump again seen. Stable heart size. Emphysema with chronic interstitial coarsening in the left lung. IMPRESSION: 1. Decreased size of right pleural effusion post thoracentesis. No visualized pneumothorax. 2. Residual patchy opacity in the right mid and lower lung zone, unchanged. 3. Otherwise stable exam from earlier today. Electronically Signed   By: Keith Rake M.D.   On: 04/02/2021 17:17     Medications:     Current Medications:  aspirin EC  81 mg Oral Daily   atorvastatin  80 mg Oral Daily   Chlorhexidine Gluconate Cloth  6 each Topical Daily   clopidogrel  75 mg Oral Q breakfast   feeding supplement  237 mL Oral BID BM   gabapentin  300 mg Oral QHS   insulin aspart  2-6 Units Subcutaneous Q4H   mouth rinse  15 mL Mouth Rinse BID   multivitamin with minerals  1 tablet Oral Daily   pantoprazole (PROTONIX) IV  40 mg Intravenous Q24H   sodium chloride flush  10-40 mL Intracatheter Q12H   sodium chloride flush  3 mL Intravenous Q12H   Zinc Oxide   Topical Daily    Infusions:  sodium chloride 250 mL (04/02/21 1818)   amiodarone 30 mg/hr (04/03/21 0639)    heparin 850 Units/hr (04/03/21 0600)   lactated ringers 75 mL/hr at 04/03/21 0600   milrinone 0.375 mcg/kg/min (04/03/21 0912)   norepinephrine (LEVOPHED) Adult infusion 14 mcg/min (04/03/21 0600)   vasopressin 0.03 Units/min (04/03/21 0600)      Assessment/Plan  Cardiogenic Shock in the setting of STEMI -EF previously normal until April of this year  ECHO showed EF was down to 30-35%  -S/P DES to RCA with DAPT.  -Persistent shock with pressors and mechanical support , IABP CO-OX + hemodynamics remain low with evidence of persistent shock. Unfortunately will not be a candidate for additional support with [place] given metastatic cancer. I discussed with him we have nothing to offer him and will try to diurese for comfort.  -CVP 18. Will given 80 mg IV lasix for palliative measures. Continue norepi and increase as needed to allow for diuresis.  - Needs family meeting today and agree with PCCM that comfort seems to be the only option.    -Would plan to remove IABP after family meeting.   2. A fib RVR  -On amio drip + heparin drip  - No with rate control.   3. R pleural Effusion -7/6 S/P R thoracentesis >1 liter removed.  -CXR today with progressive R pleural effusion  4. AKI   5. Anemia   6. H/O DVT/PE 12/2020  7.  H/O Rectal Cancer 2012, adenocardinoma  RT with Xeloda from 02/03/2011 to 03/13/2011  8.. H/O Squamous Cell Lung Cancer 2021 Received carboplatin and paclitaxel from 06/27/2020 to 08/13/2020  9.  Liver Mass Had liver biopsy 01/2021 -->Adenocarcinoma--> nonspecific ? Pancreaticobiliary ? Gastrointestinal   10.  CVA 01/2021 Severely debilitated and is wheelchair bound. He has has avascular femoral necrosis noted on PET scan.  - Unable to stand.   8  GOC - DNR/DNI  Palliative Care following.  No role for advanced therapies would favor transition to comfort care. Will likely not survive hospitalization. Doubt he would make it to [facility].  Length of Stay:  3  Amy Clegg, NP  04/03/2021, 9:27 AM  Advanced Heart Failure Team Pager [phone] (M-F; 7a - 5p)  Please contact [facility] for night-coverage after hours (4p -7a ) and weekends on  [email or web address]  Patient seen with NP, agree with the above note.   History as above.  Patient presented with inferior STEMI, suspect late presentation.  He had DES to RCA.  EF 30-35% and mild RV dysfunction with cardiogenic shock post-procedure, IABP present.  CVP 18 today. CI low by Swan, 1.8 with co-ox 54%.   Patient also had a recent CVA and is wheelchair-bound.  He had a recent PE.  He has atrial fibrillation with RVR.  He has both rectal and lung cancer with mets to the liver.    General: NAD Neck: JVP 10 cm, no thyromegaly or thyroid nodule.  Lungs: Clear to auscultation bilaterally with normal respiratory effort. CV: Nondisplaced PMI.  Heart regular S1/S2, no S3/S4, no murmur.   Abdomen: Soft, nontender, no hepatosplenomegaly, no distention.  Skin: Intact without lesions or rashes.  Neurologic: Alert and oriented x 3.  Psych: Normal affect. Extremities: No clubbing or cyanosis.  HEENT: Normal.   Cardiogenic shock despite IABP 1:1, milrinone 0.375, NE 14, vasopressin.  CVP elevated at 17.  - He is not a candidate for advanced therapies with metastatic cancer.  - He is failing current therapy.  Would continue as we are for today, DNR/DNI and anticipate eventual transition to comfort care when family has had time with him.  - Lasix 80 mg IV bid for comfort.   Loralie Champagne 04/03/2021 1:03 PM

## 2021-04-03 NOTE — Progress Notes (Signed)
Notified Dr. Lake Bells of pts CVP/BP and UP.  Instructed to proceed with 2nd dose of lasix as it is a comfort measure and to titrate up the levophed as needed.

## 2021-04-03 NOTE — Progress Notes (Signed)
ANTICOAGULATION CONSULT NOTE   Pharmacy Consult for heparin Indication:  IABP  and h/o Afib  Allergies  Allergen Reactions   Tramadol Other (See Comments)    Felt sluggish and ineffective    Darvocet [Propoxyphene N-Acetaminophen] Palpitations    Patient Measurements: Height: 5\' 9"  (175.3 cm) Weight: 70.7 kg (155 lb 13.8 oz) IBW/kg (Calculated) : 70.7 Heparin Dosing Weight: 63.5 kg   Vital Signs: Temp: 98.24 F (36.8 C) (07/07 1145) Temp Source: Core (07/07 0800) BP: 142/102 (07/07 1100) Pulse Rate: 231 (07/07 0715)  Labs: Recent Labs    04/02/2021 1632 03/30/2021 1652 04/10/2021 2155 04/01/21 0320 04/01/21 1800 04/02/21 0315 04/02/21 2332 04/03/21 0331 04/03/21 0547  HGB 13.5   < > 12.6* 11.7*  --  9.2* 9.1* 8.7*  --   HCT 40.9   < > 38.4* 35.8*  --  27.6* 27.0* 26.4*  --   PLT 190  --  230 211  --  157 152 148*  --   HEPARINUNFRC  --   --   --   --    < > <0.10* 0.14*  --  0.35  CREATININE 2.02*   < > 2.04* 1.75*  --  0.97  --  0.97  --   TROPONINIHS 01,007*  --  >24,000*  --   --   --   --   --   --    < > = values in this interval not displayed.     Estimated Creatinine Clearance: 80 mL/min (by C-G formula based on SCr of 0.97 mg/dL).   Assessment: 60 y.o. male admitted with STEMI s/p PCI, IABP, h/o Afib and Pradaxa on hold, for heparin.  Heparin restarted s/p thoracentesis 7/6 at 1630   Heparin level 0.35 (therapeutic) on gtt at 850 units/hr. No issues with line or bleeding reported per RN. Hgb 8.7 (down from 12), stable over past 12 hours.  Goal of Therapy:  Heparin level 0.3-0.5 units/ml Monitor platelets by anticoagulation protocol: Yes   Plan:  Continue Heparin to 850 units/hr  Check heparin level in 6 hours (1400 7/7)  Cathrine Muster, PharmD Please see amion for complete clinical pharmacist phone list 04/03/2021 11:53 AM

## 2021-04-03 NOTE — Progress Notes (Signed)
Daily Progress Note   Patient Name: Juan Wheeler       Date: 04/03/2021 DOB: 02/15/1961  Age: 60 y.o. MRN#: [id number] Attending Physician: Burnell Blanks Primary Care Physician: [name], MD Admit Date: 03/30/2021 Length of Stay: 3 days  Reason for Consultation/Follow-up: Establishing goals of care, Non pain symptom management, Psychosocial/spiritual support, and Withdrawal of life-sustaining treatment  HPI/Patient Profile:  Juan Wheeler is a 60 yo male with history of HTN, chronic kidney disease, colorectal adenocarcinoma, lung cancer (squamous cell carcinoma), tobacco abuse, DVT, bilateral PE on Pradaxa, paroxysmal atrial fibrillation, CAD with prior stenting of the Circumflex artery in 2014, ischemic cardiomyopathy with LVEF=30-35% in April 2022, PAD with prior left fem-pop bypass in 2016 and stroke May 2022 who presented to the [facility] ED with c/o chest pain around 4:45 pm. His chest pain began around 7 am but he did not seek medical attention. He has been having pain all day. No relief with NTG. EKG in ED with inferior and anterolateral ST elevation. Pt with ongoing chest pain. Code STEMI activated by ED staff. Systolic BP 25-36 in the ED. He was given a fluid bolus, heparin bolus and started on Levophed.   Palliative care had seen Juan Wheeler in April of this year. At that time he had elected for all measures to sustain life. A review of his PMH and comorbid conditions was had.    We were asked in the setting of recurrent re-hospitalizations and overall poor health state to readdress goals of care.   Currently not doing well. Remains on milrinone, levophed, vasopressin as well as IABP 1:1. CoOx worsening. Had thoracentesis yesterday and appears to be re-accumulating today.  Per advanced HF team, not a candidate for advanced therapies. Not improving/worsening on current support. We were asked to facilitate family meeting for GOC/de-escalation discussion.  Current  Medications: Scheduled Meds:   aspirin EC  81 mg Oral Daily   atorvastatin  80 mg Oral Daily   Chlorhexidine Gluconate Cloth  6 each Topical Daily   clopidogrel  75 mg Oral Q breakfast   feeding supplement  237 mL Oral BID BM   furosemide  80 mg Intravenous BID   gabapentin  300 mg Oral QHS   insulin aspart  2-6 Units Subcutaneous Q4H   mouth rinse  15 mL Mouth Rinse BID   multivitamin with minerals  1 tablet Oral Daily   pantoprazole (PROTONIX) IV  40 mg Intravenous Q24H   sodium chloride flush  10-40 mL Intracatheter Q12H   sodium chloride flush  3 mL Intravenous Q12H   Zinc Oxide   Topical Daily    Continuous Infusions:  sodium chloride 250 mL (04/02/21 1818)   amiodarone 30 mg/hr (04/03/21 1200)   heparin 850 Units/hr (04/03/21 1200)   lactated ringers Stopped (04/03/21 0807)   milrinone 0.375 mcg/kg/min (04/03/21 1200)   norepinephrine (LEVOPHED) Adult infusion 12 mcg/min (04/03/21 1227)   vasopressin Stopped (04/03/21 0707)    PRN Meds: sodium chloride, sodium chloride, HYDROcodone-acetaminophen, lip balm, ondansetron (ZOFRAN) IV, sodium chloride flush, sodium chloride flush  Subjective:   Subjective: Chart Reviewed. Updates received. Patient Assessed. Created space and opportunity for patient  and family to explore thoughts and feelings regarding current medical situation.  Today's Discussion: Today I met at the patient's bedside with the patient, his wife Juan Wheeler, his son Juan Wheeler, Juan Wheeler.  Later further discussions with the patient's Sister Juan Wheeler, brother-in-law Juan Wheeler.  We discussed the patient's previous status.  His son indicates he  is superman and he is able to overcome almost anything.  He has instilled this and his son as well.  They discussed his multiple health struggles over the past several years, all of which he has been able to overcome.  He has been married to his current wife Juan Wheeler for 8 years.  They have an anniversary next year.  She also read  reminisces about him struggling with cancer.  When she met him he was on disability.  Despite everything, even when he is in pain, he does not complain.  His hobbies include yard work and he especially enjoys spending time with his grandchildren.  They describe that through his life he has been started to show in the center of attention.  He has been primary support person for many people.  We discussed and clarify the patient's current clinical situation.  We discussed how his current therapies are temporary, akin to a Band-Aid to allow the wound to heal.  However, despite this support his heart has continued to get weaker and "the wound is not only not healing, but actually getting worse."  We discussed that he is not a candidate for advanced therapies because his body and its current state would not tolerate these.  They verbalized understanding.  His son became quite tearful, as is the patient, when his son offered "that you can have my heart.  Just take my heart."  At this time we requested the presence of Dorian Pod, our chaplain, for spiritual support.  We continued to discuss the patient's life and all that he means.  We acknowledged that his spirit is very strong and very lively as well as very independent.  We talked about how the body is a temporary vessel and, as weakness flashes, cannot live forever.  We discussed that when the body passes away it would free his spirit to return to having an shine as it is meant to.  They seem to find comfort in this.  His wife shares that she knows he will go to heaven because of the good life he has led and that she knows that she will see him again.  We discussed that we would not escalate care.  We will allow family time, liberalize visitation over the next 24 hours to allow family to visit him, be with him, and surround him in the "blanket of love that he is woven through the years of his life."  Tentatively, we will plan to begin weaning down the drips and the  mechanical balloon pump support sometime tomorrow early afternoon.  Juan Wheeler was present during the meeting to discuss medical specifics.  He indicated that when the support was removed he feels that the patient would likely have minutes to hours left.  Review of Systems  Constitutional:  Positive for fatigue.       Denies pain in general  Respiratory:  Negative for cough, chest tightness and shortness of breath.   Gastrointestinal:  Negative for abdominal pain.   Objective:   Vital Signs: BP (!) 142/102   Pulse (!) 231   Temp 98.24 F (36.8 C)   Resp 14   Ht 5' 9"  (1.753 m)   Wt 70.7 kg   SpO2 (!) 84%   BMI 23.02 kg/m  SpO2: SpO2: (!) 84 % O2 Device: O2 Device: Nasal Cannula O2 Flow Rate: O2 Flow Rate (L/min): 2 L/min  Physical Exam: Physical Exam Constitutional:      General: He is sleeping. He is not in  acute distress.    Appearance: He is ill-appearing.  Cardiovascular:     Rate and Rhythm: Tachycardia present. Rhythm irregular.     Comments: IABP in place Pulmonary:     Effort: Pulmonary effort is normal. No respiratory distress.  Abdominal:     General: Abdomen is flat. There is no distension.     Palpations: Abdomen is soft.     Tenderness: There is no abdominal tenderness.  Skin:    General: Skin is cool and dry.     Comments: Cool extremities  Neurological:     Mental Status: He is easily aroused.  Psychiatric:        Attention and Perception: Attention normal.        Mood and Affect: Mood normal. Affect is tearful (at times).        Behavior: Behavior normal.    SpO2: SpO2: (!) 84 % O2 Device:SpO2: (!) 84 % O2 Flow Rate: .O2 Flow Rate (L/min): 2 L/min  IO: Intake/output summary:  Intake/Output Summary (Last 24 hours) at 04/03/2021 1326 Last data filed at 04/03/2021 1315 Gross per 24 hour  Intake 2924.25 ml  Output 3440 ml  Net -515.75 ml    LBM: Last BM Date: 04/03/21 Baseline Weight: Weight: 63.5 kg Most recent weight: Weight: 70.7 kg    Palliative Assessment/Data: 30 %   Assessment & Plan:   Impression:  Critically ill 60 year old male with multiple chronic illnesses now facing cardiogenic shock status post MI.  Not doing well with support including multiple drips and balloon pump support as described above.  Because of no improvement the feeling is to slowly titrate down and withdraw support, focus on comfort rather than cure, allowing natural death.  Patient is aware of these recommendations as is his family.  Family appears to be struggling somewhat with this, particularly his son Juan Wheeler.  SUMMARY OF RECOMMENDATIONS   DNR (as per previous) No escalation of medical therapies Liberalization of visitation to allow family to visit Tentatively plan for titration down/removal of life support early tomorrow afternoon At the time of withdrawal we will shift to focus on full comfort care Medication specifics to be decided tomorrow based on patient's clinical status Continued PMT support  Code Status: DNR  Goals of Care/Recommendations: Shift to comfort care tomorrow when we begin to wean/withdrawal life support  Symptom Management: None at this time Can address any pain management or dyspnea as they arise We will plan (tentatively) for morphine drip at the time of life support withdrawal for pain and respiratory issues Ativan can be used for anxiety, Robinul for secretions  Prognosis: < 2 weeks  Discharge Planning: Anticipated Hospital Death  Discussed with: Juan Wheeler, family, RN Elmyra Ricks)  Thank you for allowing [place] to participate in the care of EVREN SHANKLAND PMT will continue to support holistically.  Time Total: 120 mins  Visit consisted of counseling and education dealing with the complex and emotionally intense issues of symptom management and palliative care in the setting of serious and potentially life-threatening illness. Greater than 50%  of this time was spent counseling and coordinating care related  to the above assessment and plan.  Walden Field, NP and Elder Love, NP  Palliative Medicine Team  Team Phone # [phone] (Nights/Weekends)  04/03/2021, 1:26 PM

## 2021-04-03 NOTE — Progress Notes (Signed)
Attending:    Subjective: Thoracentesis yesterday Not making much urine Ate some yesterday Feels fatigued Palliative medicine saw him yesterday Not making much urine  Objective: Vitals:   04/03/21 0715 04/03/21 0730 04/03/21 0745 04/03/21 0800  BP:    107/90  Pulse: (!) 231     Resp: 14 13 12 10   Temp: 97.88 F (36.6 C) 97.88 F (36.6 C) 97.88 F (36.6 C) 97.88 F (36.6 C)  TempSrc:    Core  SpO2: 100% 97% 97% 95%  Weight:      Height:          Intake/Output Summary (Last 24 hours) at 04/03/2021 0944 Last data filed at 04/03/2021 0600 Gross per 24 hour  Intake 2924.5 ml  Output 940 ml  Net 1984.5 ml    General:  Resting comfortably in bed HENT: NCAT OP clear PULM: diminished R base B, normal effort CV: JVD noted, balloon pump sounds noted GI: BS+, soft, nontender MSK: normal bulk and tone Neuro: awake, alert, no distress, MAEW    CBC    Component Value Date/Time   WBC 9.1 04/03/2021 0331   RBC 2.69 (L) 04/03/2021 0331   HGB 8.7 (L) 04/03/2021 0331   HCT 26.4 (L) 04/03/2021 0331   PLT 148 (L) 04/03/2021 0331   MCV 98.1 04/03/2021 0331   MCH 32.3 04/03/2021 0331   MCHC 33.0 04/03/2021 0331   RDW 15.6 (H) 04/03/2021 0331   LYMPHSABS 0.7 04/03/2021 0331   MONOABS 1.1 (H) 04/03/2021 0331   EOSABS 0.2 04/03/2021 0331   BASOSABS 0.0 04/03/2021 0331    BMET    Component Value Date/Time   NA 131 (L) 04/03/2021 0331   K 3.9 04/03/2021 0331   CL 102 04/03/2021 0331   CO2 25 04/03/2021 0331   GLUCOSE 95 04/03/2021 0331   BUN 14 04/03/2021 0331   CREATININE 0.97 04/03/2021 0331   CREATININE 1.10 04/14/2013 1617   CALCIUM 7.9 (L) 04/03/2021 0331   GFRNONAA >60 04/03/2021 0331   GFRAA >60 06/27/2020 0927    CXR images right pleural effusion, balloon pump in place  Coox 53.9  Pleural fluid analysis: LDH 337, glucose 128, Total protein 3.4, 225 WBC, 79% Lymph  Impression/Plan: Exudative pleural effusion > likely both transudative and exudative,  follow up cytology; I do not think that we should consider a pleurex catheter as I anticipate  Cardiogenic shock> wean off levophed for MAP > 65, continue balloon pump and and milrinone, follow coox; given fluid retention and low coox he remains in shock and is unlikely to recovery; see discussion below Atrial fibrillation> heparin infusion  Juan Wheeler is in an unfortunate situation.  Prior to having his MI, his chances of surviving for 6 months was near 0% because of his baseline advanced malignancies, recent stroke, deconditioned, non-mobile status.  Now in addition to that he is suffering from ongoing cardiogenic shock with the expected complications (dyspnea, pleural effusion, edema).  He will not survive this illness and further advanced therapies like CRRT or an impella would not provide medical benefit as he is not a candidate for LVAD or heart transplant.  Based on this I recommend stopping treatment now and focusing completely on comfort measures.  Palliative care to see patients today  My cc time 35 minutes  Juan Awkward, MD [facility] Pager: [phone] Cell: [phone] After 7pm: [phone]

## 2021-04-04 ENCOUNTER — Other Ambulatory Visit: Payer: Self-pay | Admitting: Acute Care

## 2021-04-04 ENCOUNTER — Inpatient Hospital Stay (HOSPITAL_COMMUNITY): Payer: Medicare PPO

## 2021-04-04 DIAGNOSIS — Z7189 Other specified counseling: Secondary | ICD-10-CM | POA: Diagnosis not present

## 2021-04-04 DIAGNOSIS — I69351 Hemiplegia and hemiparesis following cerebral infarction affecting right dominant side: Secondary | ICD-10-CM | POA: Diagnosis not present

## 2021-04-04 DIAGNOSIS — C3490 Malignant neoplasm of unspecified part of unspecified bronchus or lung: Secondary | ICD-10-CM | POA: Diagnosis not present

## 2021-04-04 DIAGNOSIS — C20 Malignant neoplasm of rectum: Secondary | ICD-10-CM | POA: Diagnosis not present

## 2021-04-04 DIAGNOSIS — Z515 Encounter for palliative care: Secondary | ICD-10-CM

## 2021-04-04 DIAGNOSIS — K602 Anal fissure, unspecified: Secondary | ICD-10-CM | POA: Diagnosis not present

## 2021-04-04 DIAGNOSIS — R0600 Dyspnea, unspecified: Secondary | ICD-10-CM

## 2021-04-04 DIAGNOSIS — I69322 Dysarthria following cerebral infarction: Secondary | ICD-10-CM | POA: Diagnosis not present

## 2021-04-04 DIAGNOSIS — I429 Cardiomyopathy, unspecified: Secondary | ICD-10-CM

## 2021-04-04 DIAGNOSIS — I2510 Atherosclerotic heart disease of native coronary artery without angina pectoris: Secondary | ICD-10-CM | POA: Diagnosis not present

## 2021-04-04 DIAGNOSIS — G629 Polyneuropathy, unspecified: Secondary | ICD-10-CM

## 2021-04-04 DIAGNOSIS — I4891 Unspecified atrial fibrillation: Secondary | ICD-10-CM

## 2021-04-04 DIAGNOSIS — I2101 ST elevation (STEMI) myocardial infarction involving left main coronary artery: Secondary | ICD-10-CM

## 2021-04-04 DIAGNOSIS — I2119 ST elevation (STEMI) myocardial infarction involving other coronary artery of inferior wall: Secondary | ICD-10-CM | POA: Diagnosis not present

## 2021-04-04 DIAGNOSIS — I10 Essential (primary) hypertension: Secondary | ICD-10-CM | POA: Diagnosis not present

## 2021-04-04 DIAGNOSIS — I213 ST elevation (STEMI) myocardial infarction of unspecified site: Secondary | ICD-10-CM | POA: Diagnosis not present

## 2021-04-04 DIAGNOSIS — Z7901 Long term (current) use of anticoagulants: Secondary | ICD-10-CM

## 2021-04-04 DIAGNOSIS — I739 Peripheral vascular disease, unspecified: Secondary | ICD-10-CM

## 2021-04-04 DIAGNOSIS — I69318 Other symptoms and signs involving cognitive functions following cerebral infarction: Secondary | ICD-10-CM | POA: Diagnosis not present

## 2021-04-04 DIAGNOSIS — G8929 Other chronic pain: Secondary | ICD-10-CM | POA: Diagnosis not present

## 2021-04-04 DIAGNOSIS — M4802 Spinal stenosis, cervical region: Secondary | ICD-10-CM

## 2021-04-04 DIAGNOSIS — I6502 Occlusion and stenosis of left vertebral artery: Secondary | ICD-10-CM

## 2021-04-04 DIAGNOSIS — F17210 Nicotine dependence, cigarettes, uncomplicated: Secondary | ICD-10-CM

## 2021-04-04 DIAGNOSIS — D696 Thrombocytopenia, unspecified: Secondary | ICD-10-CM

## 2021-04-04 DIAGNOSIS — I2699 Other pulmonary embolism without acute cor pulmonale: Secondary | ICD-10-CM

## 2021-04-04 DIAGNOSIS — K219 Gastro-esophageal reflux disease without esophagitis: Secondary | ICD-10-CM

## 2021-04-04 LAB — CBC
HCT: 30.3 % — ABNORMAL LOW (ref 39.0–52.0)
Hemoglobin: 10.4 g/dL — ABNORMAL LOW (ref 13.0–17.0)
MCH: 32.6 pg (ref 26.0–34.0)
MCHC: 34.3 g/dL (ref 30.0–36.0)
MCV: 95 fL (ref 80.0–100.0)
Platelets: 191 10*3/uL (ref 150–400)
RBC: 3.19 MIL/uL — ABNORMAL LOW (ref 4.22–5.81)
RDW: 15.6 % — ABNORMAL HIGH (ref 11.5–15.5)
WBC: 11.2 10*3/uL — ABNORMAL HIGH (ref 4.0–10.5)
nRBC: 0.3 % — ABNORMAL HIGH (ref 0.0–0.2)

## 2021-04-04 LAB — COOXEMETRY PANEL
Carboxyhemoglobin: 0.9 % (ref 0.5–1.5)
Methemoglobin: 0.8 % (ref 0.0–1.5)
O2 Saturation: 54 %
Total hemoglobin: 10.2 g/dL — ABNORMAL LOW (ref 12.0–16.0)

## 2021-04-04 LAB — HEPARIN LEVEL (UNFRACTIONATED): Heparin Unfractionated: 0.37 IU/mL (ref 0.30–0.70)

## 2021-04-04 LAB — GLUCOSE, CAPILLARY
Glucose-Capillary: 100 mg/dL — ABNORMAL HIGH (ref 70–99)
Glucose-Capillary: 127 mg/dL — ABNORMAL HIGH (ref 70–99)
Glucose-Capillary: 148 mg/dL — ABNORMAL HIGH (ref 70–99)
Glucose-Capillary: 162 mg/dL — ABNORMAL HIGH (ref 70–99)
Glucose-Capillary: 228 mg/dL — ABNORMAL HIGH (ref 70–99)

## 2021-04-04 LAB — BASIC METABOLIC PANEL
Anion gap: 7 (ref 5–15)
BUN: 9 mg/dL (ref 8–23)
CO2: 31 mmol/L (ref 22–32)
Calcium: 8.1 mg/dL — ABNORMAL LOW (ref 8.9–10.3)
Chloride: 96 mmol/L — ABNORMAL LOW (ref 98–111)
Creatinine, Ser: 0.99 mg/dL (ref 0.61–1.24)
GFR, Estimated: >60 mL/min (ref >60)
Glucose, Bld: 109 mg/dL — ABNORMAL HIGH (ref 70–99)
Potassium: 3.4 mmol/L — ABNORMAL LOW (ref 3.5–5.1)
Sodium: 134 mmol/L — ABNORMAL LOW (ref 135–145)

## 2021-04-04 LAB — POCT ACTIVATED CLOTTING TIME: Activated Clotting Time: 184 seconds

## 2021-04-04 MED ORDER — POTASSIUM CHLORIDE CRYS ER 20 MEQ PO TBCR
60.0000 meq | EXTENDED_RELEASE_TABLET | Freq: Once | ORAL | Status: AC
  Administered 2021-04-04: 60 meq via ORAL
  Filled 2021-04-04: qty 3

## 2021-04-04 MED ORDER — TORSEMIDE 20 MG PO TABS
20.0000 mg | ORAL_TABLET | Freq: Every day | ORAL | Status: DC
  Administered 2021-04-04 – 2021-04-05 (×2): 20 mg via ORAL
  Filled 2021-04-04 (×6): qty 1

## 2021-04-04 MED ORDER — LORAZEPAM 2 MG/ML IJ SOLN
1.0000 mg | INTRAMUSCULAR | Status: DC | PRN
  Administered 2021-04-07 – 2021-04-08 (×2): 1 mg via INTRAVENOUS
  Filled 2021-04-04 (×2): qty 1

## 2021-04-04 MED ORDER — MORPHINE SULFATE (PF) 2 MG/ML IV SOLN
2.0000 mg | INTRAVENOUS | Status: DC | PRN
  Administered 2021-04-04 – 2021-04-05 (×2): 2 mg via INTRAVENOUS
  Filled 2021-04-04 (×2): qty 1

## 2021-04-04 MED ORDER — GLYCOPYRROLATE 0.2 MG/ML IJ SOLN
0.4000 mg | INTRAMUSCULAR | Status: DC
  Administered 2021-04-04 – 2021-04-08 (×23): 0.4 mg via INTRAVENOUS
  Filled 2021-04-04 (×23): qty 2

## 2021-04-04 NOTE — Plan of Care (Signed)
  Problem: Cardiac: Goal: Ability to achieve and maintain adequate cardiopulmonary perfusion will improve Outcome: Not Progressing   Problem: Activity: Goal: Risk for activity intolerance will decrease Outcome: Not Progressing   Problem: Cardiac: Goal: Ability to maintain an adequate cardiac output will improve Outcome: Not Progressing   Problem: Respiratory: Goal: Will regain and/or maintain adequate ventilation Outcome: Not Progressing

## 2021-04-04 NOTE — Progress Notes (Signed)
Patient ID: Juan Wheeler, male   DOB: 1961-06-29, 60 y.o.   MRN: [id number]     Advanced Heart Failure Rounding Note  PCP-Cardiologist: None   Subjective:    Patient diuresed well yesterday, weight down 4 lbs.  MAP still marginal with SBP around 90.  He remains on milrinone 0.375 and NE 16, off vasopressin.  IABP at 1:1. CVP 5.   He remains in atrial fibrillation in 90s on amiodarone 30 and heparin gtt.   Swan numbers:  CVP 5 PA 37/19 CI 2.4 Co-ox 54%   Objective:   Weight Range: 68.9 kg Body mass index is 22.43 kg/m.   Vital Signs:   Temp:  [97.7 F (36.5 C)-99.14 F (37.3 C)] 98.42 F (36.9 C) (07/08 0700) Resp:  [10-27] 15 (07/08 0700) BP: (100-144)/(71-102) 111/77 (07/08 0700) SpO2:  [84 %-99 %] 97 % (07/08 0700) Arterial Line BP: (85-127)/(55-92) 89/63 (07/08 0700) Weight:  [68.9 kg] 68.9 kg (07/08 0500) Last BM Date: 04/03/21  Weight change: Filed Weights   04/02/21 0500 04/03/21 0500 04/04/21 0500  Weight: 67.4 kg 70.7 kg 68.9 kg    Intake/Output:   Intake/Output Summary (Last 24 hours) at 04/04/2021 0752 Last data filed at 04/04/2021 0700 Gross per 24 hour  Intake 1777.33 ml  Output 8045 ml  Net -6267.67 ml      Physical Exam    General:  Well appearing. No resp difficulty HEENT: Normal Neck: Supple. JVP not elevated. Carotids 2+ bilat; no bruits. No lymphadenopathy or thyromegaly appreciated. Cor: PMI nondisplaced. Irregular rate & rhythm. No rubs, gallops or murmurs. Lungs: Clear Abdomen: Soft, nontender, nondistended. No hepatosplenomegaly. No bruits or masses. Good bowel sounds. Extremities: No cyanosis, clubbing, rash, edema Neuro: Alert & orientedx3, cranial nerves grossly intact. moves all 4 extremities w/o difficulty. Affect pleasant   Telemetry   Afib 90s (personally reviewed)  Labs    CBC Recent Labs    04/02/21 0315 04/02/21 2332 04/03/21 0331 04/04/21 0351  WBC 9.5   < > 9.1 11.2*  NEUTROABS 7.9*  --  7.1  --   HGB  9.2*   < > 8.7* 10.4*  HCT 27.6*   < > 26.4* 30.3*  MCV 97.9   < > 98.1 95.0  PLT 157   < > 148* 191   < > = values in this interval not displayed.   Basic Metabolic Panel Recent Labs    04/03/21 0331 04/04/21 0351  NA 131* 134*  K 3.9 3.4*  CL 102 96*  CO2 25 31  GLUCOSE 95 109*  BUN 14 9  CREATININE 0.97 0.99  CALCIUM 7.9* 8.1*   Liver Function Tests Recent Labs    04/02/21 0315 04/03/21 0331  AST 80* 70*  ALT 53* 51*  ALKPHOS 192* 260*  BILITOT 1.3* 0.9  PROT 5.1* 5.2*  ALBUMIN 2.0* 1.9*   No results for input(s): LIPASE, AMYLASE in the last 72 hours. Cardiac Enzymes No results for input(s): CKTOTAL, CKMB, CKMBINDEX, TROPONINI in the last 72 hours.  BNP: BNP (last 3 results) Recent Labs    01/16/21 0239 02/15/21 1023  BNP 364.0* 535.0*    ProBNP (last 3 results) No results for input(s): PROBNP in the last 8760 hours.   D-Dimer No results for input(s): DDIMER in the last 72 hours. Hemoglobin A1C No results for input(s): HGBA1C in the last 72 hours. Fasting Lipid Panel No results for input(s): CHOL, HDL, LDLCALC, TRIG, CHOLHDL, LDLDIRECT in the last 72 hours. Thyroid Function Tests No  results for input(s): TSH, T4TOTAL, T3FREE, THYROIDAB in the last 72 hours.  Invalid input(s): FREET3  Other results:   Imaging    No results found.   Medications:     Scheduled Medications:  aspirin EC  81 mg Oral Daily   atorvastatin  80 mg Oral Daily   Chlorhexidine Gluconate Cloth  6 each Topical Daily   clopidogrel  75 mg Oral Q breakfast   feeding supplement  237 mL Oral BID BM   gabapentin  300 mg Oral QHS   glycopyrrolate  0.4 mg Intravenous Q4H   insulin aspart  2-6 Units Subcutaneous Q4H   mouth rinse  15 mL Mouth Rinse BID   multivitamin with minerals  1 tablet Oral Daily   pantoprazole (PROTONIX) IV  40 mg Intravenous Q24H   sodium chloride flush  10-40 mL Intracatheter Q12H   sodium chloride flush  3 mL Intravenous Q12H   torsemide  20  mg Oral Daily   Zinc Oxide   Topical Daily    Infusions:  sodium chloride 250 mL (04/02/21 1818)   amiodarone 30 mg/hr (04/04/21 0700)   heparin 850 Units/hr (04/04/21 0700)   lactated ringers Stopped (04/03/21 0807)   milrinone 0.375 mcg/kg/min (04/04/21 0700)   norepinephrine (LEVOPHED) Adult infusion 16 mcg/min (04/04/21 0700)   vasopressin Stopped (04/03/21 0707)    PRN Medications: sodium chloride, sodium chloride, HYDROcodone-acetaminophen, lip balm, LORazepam, ondansetron (ZOFRAN) IV, sodium chloride flush, sodium chloride flush  Assessment/Plan   1. Cardiogenic Shock: In setting of inferior STEMI, suspect delayed presentation.  EF previously normal until April of this year. Echo this admission with EF 30-35%, mild RV dysfunction. Persistent shock with pressors and mechanical support , IABP currently 1:1. Co-ox has remained low with evidence of persistent shock. Unfortunately will not be a candidate for additional support with Impella given metastatic cancer. He was diuresed extensively yesterday, weight down.  This morning, CVP 5 with co-ox 54% but CI on Swan was 2.4.  He remains on NE 16, milrinone 0.375, IABP 1:1 with marginal BP.  - Palliative care following, have discussed transition to comfort care later today.  - Will wean IABP to 1:2 for now, aim for removal later today.  - Continue current milrinone and NE for now.  - Stop IV Lasix, start torsemide 20 mg daily.  2. CAD: Delayed presentation inferior STEMI, s/p DES to RCA with DAPT. - He remains on ASA/Plavix and statin.  3. Atrial fibrillation: Rate-controlled today.  - On amio drip + heparin drip  4. R pleural Effusion: 7/6 S/P R thoracentesis >1 liter removed. 5. Anemia: Stable.  6. H/O DVT/PE 12/2020: On heparin gtt.  7. H/O Rectal Cancer 2012, adenocardinoma: ?mets to liver (liver biopsy 5/22 with nonspecific adenocarcinoma).  8. H/O Squamous Cell Lung Cancer 2021: Received carboplatin and paclitaxel from 06/27/2020  to 08/13/2020 9.  Liver Mass: Had liver biopsy 01/2021 -->Adenocarcinoma--> nonspecific ? Pancreaticobiliary ? Gastrointestinal 10.  CVA 01/2021: Severely debilitated and is wheelchair bound.  11. Avascular necrosis femoral head. - Unable to stand. [place] DNR/DNI.  Palliative Care following.  No role for advanced therapies would favor transition to comfort care. Will likely not survive hospitalization. Doubt he would make it to [facility].  CRITICAL CARE Performed by: Loralie Champagne  Total critical care time: 35 minutes  Critical care time was exclusive of separately billable procedures and treating other patients.  Critical care was necessary to treat or prevent imminent or life-threatening deterioration.  Critical care  was time spent personally by me on the following activities: development of treatment plan with patient and/or surrogate as well as nursing, discussions with consultants, evaluation of patient's response to treatment, examination of patient, obtaining history from patient or surrogate, ordering and performing treatments and interventions, ordering and review of laboratory studies, ordering and review of radiographic studies, pulse oximetry and re-evaluation of patient's condition.   Length of Stay: 4  Loralie Champagne, MD  04/04/2021, 7:52 AM  Advanced Heart Failure Team Pager [phone] (M-F; 7a - 5p)  Please contact [facility] for night-coverage after hours (5p -7a ) and weekends on [email or web address]

## 2021-04-04 NOTE — Progress Notes (Signed)
[place] for heparin Indication:  IABP  and h/o Afib  Allergies  Allergen Reactions   Tramadol Other (See Comments)    Felt sluggish and ineffective    Darvocet [Propoxyphene N-Acetaminophen] Palpitations    Patient Measurements: Height: 5\' 9"  (175.3 cm) Weight: 68.9 kg (151 lb 14.4 oz) IBW/kg (Calculated) : 70.7 Heparin Dosing Weight: 63.5 kg   Vital Signs: Temp: 98.42 F (36.9 C) (07/08 0700) BP: 111/77 (07/08 0700)  Labs: Recent Labs    04/02/21 0315 04/02/21 2332 04/03/21 0331 04/03/21 0547 04/04/21 0351  HGB 9.2* 9.1* 8.7*  --  10.4*  HCT 27.6* 27.0* 26.4*  --  30.3*  PLT 157 152 148*  --  191  HEPARINUNFRC <0.10* 0.14*  --  0.35 0.37  CREATININE 0.97  --  0.97  --  0.99     Estimated Creatinine Clearance: 76.4 mL/min (by C-G formula based on SCr of 0.99 mg/dL).   Assessment: 60 y.o. male admitted with STEMI s/p PCI, IABP, h/o Afib and Pradaxa on hold, for heparin.  Heparin restarted s/p thoracentesis 7/6 at 1630   Heparin level 0.37 (therapeutic) on gtt at 850 units/hr. No issues with line or bleeding reported per RN. Patient on IABP at 1:2. Plan to wean IABP off today. Will stop heparin before IABP removed. Hgb 10.4, stable.  Goal of Therapy:  Heparin level 0.3-0.5 units/ml Monitor platelets by anticoagulation protocol: Yes   Plan:  Continue Heparin to 850 units/hr  Check heparin level in 24 hours (7/9 AM labs)  Cathrine Muster, PharmD Please see amion for complete clinical pharmacist phone list 04/04/2021 10:54 AM

## 2021-04-04 NOTE — Progress Notes (Signed)
49fr swan and 53fr sheath aspirated and removed from rfv. Manual pressure applied for 5 minutes, then IABP aspirated and removed from rfa, manual pressure applied over both sites, for an additional 30 minutes. Site level 0 no S+S of hematoma, tegaderm dressing applied, bedrest instructions given.   Bilateral dp and pt pulses palpable.   Bedrest begins at 13:50:00

## 2021-04-04 NOTE — Progress Notes (Signed)
NAME:  Juan Wheeler, MRN:  [id number], DOB:  1961/05/04, LOS: 4 ADMISSION DATE:  03/30/2021, CONSULTATION DATE:  7/4 REFERRING MD:  Angelena Form, CHIEF COMPLAINT:  Chest pain   History of Present Illness:  60 y/o male with multiple medical problems presented with RCA STEMI to [facility].  He was moved to [facility] for LHC where he had a PCI to RCA and a balloon pump was placed due to cardiogenic shock.   Pertinent  Medical History  Ischemic cardiomyopathy 12/2020 TTE LVEF 40-10%, RV ssytolic function decreased aortic valve not well visualized, LA dilated CAD DVT, bilateral pulmonary emboli GERD Colon cancer> rectal adenocarcinoma Squamous cell lung cancer IIIa, diagnosed 2021> treated with carboplatin and paclitaxel 05/2020 > 07/2020 Atrial fibrillation on pradaxa Chronic low back pain  Tobacco abuse CVA in 02/2021 > vertebral artery stroke, left greater than right vascular territories.    Significant Hospital Events: Including procedures, antibiotic start and stop dates in addition to other pertinent events   7/4 admitted with RCA STEMI, cardiogenic shock, IABP Renal function improved, mental status O 7/7 Palliation >> DNR, plan to not escalate care, liberation of visitation for family, plan for withdrawal 7/8 afternoon 7/8 Pleural Fluid>> Malignant cells consistent with adenocarcinoma   Interim History / Subjective:  Resting on 3 L [place] with sats of 97%, No increased WOB at rest.  Tired today Diuresis 7/7>> 8 L UO, net negative 970 cc's ( Total of 160 mg Lasix) HGB 10, WBC 11.2, platelets 191 CVP 5 PA 37/19 CI 2.4 Co-ox 54% Remains on milrinone 0.375 and NE 16, off vasopressin.  IABP at 1:1 K 3.4>> repleted  Objective   Blood pressure 111/77, pulse (!) 231, temperature 98.42 F (36.9 C), resp. rate 15, height 5\' 9"  (1.753 m), weight 68.9 kg, SpO2 97 %. PAP: (27-57)/(9-31) 34/19 CVP:  [5 mmHg-21 mmHg] 18 mmHg PCWP:  [10 mmHg-19 mmHg] 19 mmHg CO:  [3.4 L/min-4.9 L/min] 4.2 L/min CI:  [1.9  L/min/m2-2.8 L/min/m2] 2.4 L/min/m2      Intake/Output Summary (Last 24 hours) at 04/04/2021 2725 Last data filed at 04/04/2021 0700 Gross per 24 hour  Intake 1531.03 ml  Output 8045 ml  Net -6513.97 ml   Filed Weights   04/02/21 0500 04/03/21 0500 04/04/21 0500  Weight: 67.4 kg 70.7 kg 68.9 kg    Examination:  General:  Resting comfortably in bed, NAD HENT: NCAT OP clear, No LAD, No JVD PULM: Diminished R base, otherwise CTA, normal effort, no accessory muscle use CV: IRR, Atrial Fib per tele, no mgr, balloon pump sounds noted GI: BS+, soft, non tender, Body mass index is 22.43 kg/m. MSK: Thin, some muscle wasting, wheelchair bound Neuro: Sleeping, opens eye with tactile stimulation,  tired, no distress, MAEW   Resolved Hospital Problem list     Assessment & Plan:  Cardiogenic shock > UOP OK, renal function improving RCA STEMI s/p PCI CVP 5 Good Diuresis 7/7 ( 8 L off) Now net negative 970 cc's Wean off norepinephrine for MAP > 65 Vasopressin off 7/7 Continue milrinone If off vaso/levophed then stop balloon pump ASA, plavix Monitor UOP Would not extend inotrope or mechanical support for more than 7 days from time of cath given severe underlying comorbid illnesses Cards plan to  wean IABP to 1:2 for now, aim for removal later today. Continue current milrinone and NE for now. Stop IV Lasix, start torsemide 20 mg daily.  Atrial fibrillation Tele Heparin per pharmacy consult Amiodarone gtt  History of DVT/PE on Eliquis  Heparin in fusion  Right pleural effusion,  Re accumulation of right effusion  Cytology + for Malignant cells consistent with adenocarcinoma  Thoracentesis 7/7 > diagnostic and therapeutic  Trend CXR  AKI > improved Creatinine 0.99 Monitor BMET and UOP Replace electrolytes as needed  Lung cancer Adenocarcinoma on liver biopsy from 01/2021, last heme onc note indicates unclear primary Cytology + for Malignant cells consistent with  adenocarcinoma  As above  Prognosis: poor.  Continue current management but he would not benefit from prolonged therapy given his severe underlying illnesses.  Made DNR 7/7, with plan to wean IABP to 1:2 for now, aim for removal later today. Re-accumulation of right effusion on CXR, if he survives removal of IABP, and can wean form ionotropic support, can consider Pleurex catheter.  Best Practice (right click and "Reselect all SmartList Selections" daily)   Diet/type: Regular consistency (see orders) DVT prophylaxis: systemic heparin GI prophylaxis: N/A Lines: yes and it is still needed Foley:  Yes, and it is still needed Code Status:  DNR Last date of multidisciplinary goals of care discussion [7/5 with [place], updated his wife with this information, she agrees.]  Labs   CBC: Recent Labs  Lab 04/07/2021 1632 04/23/2021 1652 04/01/21 0320 04/02/21 0315 04/02/21 2332 04/03/21 0331 04/04/21 0351  WBC 15.4*   < > 15.5* 9.5 8.9 9.1 11.2*  NEUTROABS 12.8*  --   --  7.9*  --  7.1  --   HGB 13.5   < > 11.7* 9.2* 9.1* 8.7* 10.4*  HCT 40.9   < > 35.8* 27.6* 27.0* 26.4* 30.3*  MCV 98.8   < > 99.4 97.9 98.2 98.1 95.0  PLT 190   < > 211 157 152 148* 191   < > = values in this interval not displayed.    Basic Metabolic Panel: Recent Labs  Lab 04/07/2021 2155 04/01/21 0320 04/02/21 0315 04/03/21 0331 04/04/21 0351  NA 130* 132* 132* 131* 134*  K 4.5 4.6 4.0 3.9 3.4*  CL 100 103 104 102 96*  CO2 20* 19* 21* 25 31  GLUCOSE 220* 190* 116* 95 109*  BUN 28* 28* 15 14 9   CREATININE 2.04* 1.75* 0.97 0.97 0.99  CALCIUM 8.1* 8.3* 8.0* 7.9* 8.1*  MG 1.8 1.9  --   --   --    GFR: Estimated Creatinine Clearance: 76.4 mL/min (by C-G formula based on SCr of 0.99 mg/dL). Recent Labs  Lab 04/18/2021 2335 04/01/21 0251 04/01/21 0320 04/02/21 0315 04/02/21 2332 04/03/21 0331 04/04/21 0351  WBC  --   --    < > 9.5 8.9 9.1 11.2*  LATICACIDVEN 3.1* 3.0*  --   --   --   --   --    < > =  values in this interval not displayed.    Liver Function Tests: Recent Labs  Lab 04/10/2021 1632 04/02/21 0315 04/03/21 0331  AST 113* 80* 70*  ALT 73* 53* 51*  ALKPHOS 234* 192* 260*  BILITOT 1.4* 1.3* 0.9  PROT 6.9 5.1* 5.2*  ALBUMIN 2.6* 2.0* 1.9*   No results for input(s): LIPASE, AMYLASE in the last 168 hours. No results for input(s): AMMONIA in the last 168 hours.  ABG    Component Value Date/Time   PHART 7.364 04/09/2021 1925   PCO2ART 35.2 04/13/2021 1925   PO2ART 105 04/24/2021 1925   HCO3 22.4 04/15/2021 1928   TCO2 24 04/27/2021 1928   ACIDBASEDEF 3.0 (H) 04/04/2021 1928   O2SAT 54.0 04/04/2021 0351  Coagulation Profile: No results for input(s): INR, PROTIME in the last 168 hours.  Cardiac Enzymes: No results for input(s): CKTOTAL, CKMB, CKMBINDEX, TROPONINI in the last 168 hours.  HbA1C: Hgb A1c MFr Bld  Date/Time Value Ref Range Status  02/16/2021 04:45 AM 6.8 (H) 4.8 - 5.6 % Final    Comment:    (NOTE) Pre diabetes:          5.7%-6.4%  Diabetes:              >6.4%  Glycemic control for   <7.0% adults with diabetes   06/13/2016 05:40 AM 5.9 (H) 4.8 - 5.6 % Final    Comment:    (NOTE)         Pre-diabetes: 5.7 - 6.4         Diabetes: >6.4         Glycemic control for adults with diabetes: <7.0     CBG: Recent Labs  Lab 04/03/21 1616 04/03/21 2015 04/04/21 0003 04/04/21 0340 04/04/21 0700  GLUCAP 143* 166* 127* 100* 148*       Critical care time: 40 minutes     Magdalen Spatz, MSN, AGACNP-BC [place] for personal pager PCCM on call pager [phone] If no response, please call [phone] until 7pm>> Hospital Use only After 7:00 pm call [facility] use only 04/04/2021  9:00 AM

## 2021-04-04 NOTE — Progress Notes (Signed)
Daily Progress Note   Wheeler Name: Juan Wheeler       Date: 04/04/2021 DOB: [birth year]  Age: 60 y.o. MRN#: [id number] Attending Physician: Burnell Blanks Primary Care Physician: [name], MD Admit Date: 04/11/2021 Length of Stay: 4 days  Reason for Consultation/Follow-up: Establishing goals of care, Non pain symptom management, Pain control, and Withdrawal of life-sustaining treatment  HPI/Wheeler Profile:  Juan Wheeler is a 60 yo male with history of HTN, chronic kidney disease, colorectal adenocarcinoma, lung cancer (squamous cell carcinoma), tobacco abuse, DVT, bilateral PE on Pradaxa, paroxysmal atrial fibrillation, CAD with prior stenting of Juan Circumflex artery in 2014, ischemic cardiomyopathy with LVEF=30-35% in April 2022, PAD with prior left fem-pop bypass in 2016 and stroke May 2022 who presented to [facility] ED with c/o chest pain around 4:45 pm. His chest pain began around 7 am but he did not seek medical attention. He has been having pain all day. No relief with NTG. EKG in ED with inferior and anterolateral ST elevation. Pt with ongoing chest pain. Code STEMI activated by ED staff. Systolic BP 57-32 in Juan ED. He was given a fluid bolus, heparin bolus and started on Levophed.   Palliative care had seen Juan Wheeler in April of this year. At that time he had elected for all measures to sustain life. A review of his PMH and comorbid conditions was had.    We were asked in Juan setting of recurrent re-hospitalizations and overall poor health state to readdress goals of care. After discussion yesterday, Juan decision was made to begin weaning IABP and support medications today. Extensive discussion was had. Time for family to visit.  Today his levophed was increased when IABP titrated to 1:2. Aroudn 11:30, IABP was reduced to 1:3 and then around noon was stopped. Cath lab tech pulled Juan Wheeler and IABP. At that time milrinone, heparin, amiodarone was discontinued. Started  process of weaning Juan levophed with goal of discontinuing it.  Current Medications: Scheduled Meds:   aspirin EC  81 mg Oral Daily   atorvastatin  80 mg Oral Daily   Chlorhexidine Gluconate Cloth  6 each Topical Daily   clopidogrel  75 mg Oral Q breakfast   feeding supplement  237 mL Oral BID BM   gabapentin  300 mg Oral QHS   glycopyrrolate  0.4 mg Intravenous Q4H   mouth rinse  15 mL Mouth Rinse BID   multivitamin with minerals  1 tablet Oral Daily   pantoprazole (PROTONIX) IV  40 mg Intravenous Q24H   sodium chloride flush  10-40 mL Intracatheter Q12H   sodium chloride flush  3 mL Intravenous Q12H   torsemide  20 mg Oral Daily   Zinc Oxide   Topical Daily    Continuous Infusions:  sodium chloride 250 mL (04/02/21 1818)   amiodarone 30 mg/hr (04/04/21 0700)   heparin 850 Units/hr (04/04/21 0700)   milrinone 0.375 mcg/kg/min (04/04/21 0700)   norepinephrine (LEVOPHED) Adult infusion 16 mcg/min (04/04/21 0759)    PRN Meds: sodium chloride, sodium chloride, HYDROcodone-acetaminophen, lip balm, LORazepam, morphine injection, ondansetron (ZOFRAN) IV, sodium chloride flush, sodium chloride flush  Subjective:   Subjective: Chart Reviewed. Updates received. Wheeler Assessed. Created space and opportunity for Wheeler  and family to explore thoughts and feelings regarding current medical situation.  Today's Discussion: Throughout Juan day, over Juan course of multiple visits with Juan Wheeler and his family they were kept informed of Juan timeline for weaning medications and devices.  They were in  agreement.  Juan Wheeler denied any shortness of breath, pain initially.  However, orders were put in for morphine IV 2 mg every hour as needed.  He was given a dose at Juan time to pull his balloon pump for discomfort.  Later in Juan afternoon still without respiratory distress, dyspnea, pain.  Game plan developed for morphine drip with basal and bolus dosing, Ativan as needed for anxiety in case Juan  Wheeler develops dyspnea, pain, anxiety, or for general discomfort.  Juan Wheeler's wife states she is doing okay.  She continues to be by his side persistently for support.  Juan Wheeler denies any concerns or fears at this time stating "we can just do what we need to do."  They have a strong faith and spiritual care has been very supportive throughout Juan whole process.  A brief period of what appeared to be a "wet" cough was noted.  Juan Wheeler had not been given Robinul as of yet and recommended Juan nurse go ahead and give it.  Scheduled for every 4 hours.  Review of Systems  Constitutional:  Positive for fatigue.  Respiratory:  Negative for chest tightness and shortness of breath.   Cardiovascular:  Negative for chest pain.  Gastrointestinal:  Negative for abdominal pain.  Psychiatric/Behavioral:  Negative for agitation. Juan Wheeler is not nervous/anxious.    Objective:   Vital Signs: BP 111/77   Pulse (!) 231   Temp 98.42 F (36.9 C)   Resp 15   Ht 5\' 9"  (1.753 m)   Wt 68.9 kg   SpO2 97%   BMI 22.43 kg/m  SpO2: SpO2: 97 % O2 Device: O2 Device: Nasal Cannula O2 Flow Rate: O2 Flow Rate (L/min): 2 L/min  Physical Exam: Physical Exam Vitals and nursing note reviewed.  Constitutional:      General: He is not in acute distress.    Appearance: He is ill-appearing.     Comments: Alert but intermittently sleepy  Cardiovascular:     Rate and Rhythm: Normal rate. Rhythm irregular.  Pulmonary:     Effort: Pulmonary effort is normal. No accessory muscle usage or respiratory distress.     Breath sounds: No wheezing.     Comments: Cough noted briefly Abdominal:     General: Abdomen is flat. There is no distension.     Palpations: Abdomen is soft.     Tenderness: There is no abdominal tenderness.  Neurological:     Mental Status: He is oriented to person, place, and time.  Psychiatric:        Mood and Affect: Mood normal.      SpO2: SpO2: 97 % O2 Device:SpO2: 97 % O2 Flow  Rate: .O2 Flow Rate (L/min): 2 L/min  IO: Intake/output summary:  Intake/Output Summary (Last 24 hours) at 04/04/2021 1306 Last data filed at 04/04/2021 0900 Gross per 24 hour  Intake 1063.65 ml  Output 5210 ml  Net -4146.35 ml    LBM: Last BM Date: 04/03/21 Baseline Weight: Weight: 63.5 kg Most recent weight: Weight: 68.9 kg   Palliative Assessment/Data: 30%   Assessment & Plan:   Impression:  Critically ill 60 year old male with multiple chronic illnesses now facing cardiogenic shock status post MI.  Not doing well with support including multiple drips and balloon pump support as described above.  Because of no improvement Juan feeling is to slowly titrate down and withdraw support, focus on comfort rather than cure, allowing natural death.  Wheeler is aware of these recommendations as is his family.  Juan Wheeler and Juan family appear to be handling this better today than yesterday.  Prognosis is likely hours to days without ongoing blood pressure and cardiac support devices/medications.  SUMMARY OF RECOMMENDATIONS   DNR (as per previous) No escalation of medical therapies Liberalization of visitation to allow family to visit Continue weaning of Levophed Unnecessary medications, lab draws, needlesticks discontinued Focus on comfort Morphine 2 mg IV every hour for now, can transition to a morphine drip if and as needed IV Ativan every 4 hours as needed for any anxiety Continued PMT support  Code Status: DNR  Goals of Care/Recommendations: Comfort focus  Symptom Management: IV Ativan for anxiety IV morphine push for dyspnea, pain Transition to morphine drip as needed, parameters to be determined later Scheduled Robinul for secretions  Prognosis: Hours - Days  Discharge Planning: Anticipated Hospital Death  Discussed with: Nursing staff, Pharmacist, medical staff  Thank you for allowing [place] to participate in Juan care of PIERSON VANTOL PMT will continue to support  holistically.  Time Total: 120 min  Visit consisted of counseling and education dealing with Juan complex and emotionally intense issues of symptom management and palliative care in Juan setting of serious and potentially life-threatening illness. Greater than 50%  of this time was spent counseling and coordinating care related to Juan above assessment and plan.   ADDENDUM: Wheeler is off all pressors, IABP. Stable at this time. No dyspnea or pain. Order in for transfer to [facility] for Wheeler/family comfort. Will continue to follow closely throughout Juan weekend. Can consider [place] if Wheeler continues to be stable for now.  Walden Field, NP Palliative Medicine Team  Team Phone # [phone] (Nights/Weekends)  04/04/2021, 1:06 PM

## 2021-04-04 NOTE — Progress Notes (Signed)
[facility] Progress Note Patient Name: Juan Wheeler DOB: 08-15-1961 MRN: [id number]   Date of Service  04/04/2021  HPI/Events of Note  K 3.4.  eICU Interventions  KCl 60 mEq PO x1 dose.     Intervention Category Minor Interventions: Electrolytes abnormality - evaluation and management  Charlott Rakes 04/04/2021, 5:44 AM

## 2021-04-04 NOTE — Progress Notes (Signed)
This chaplain is present for spiritual care check in with the Pt. RN-Nicole.  The chaplain was updated by the PMT NP-Eric.    The chaplain is available for F/U spiritual care as needed, [phone].

## 2021-04-05 ENCOUNTER — Inpatient Hospital Stay (HOSPITAL_COMMUNITY): Payer: Medicare PPO

## 2021-04-05 DIAGNOSIS — Z7189 Other specified counseling: Secondary | ICD-10-CM | POA: Diagnosis not present

## 2021-04-05 DIAGNOSIS — R0600 Dyspnea, unspecified: Secondary | ICD-10-CM | POA: Diagnosis not present

## 2021-04-05 DIAGNOSIS — I213 ST elevation (STEMI) myocardial infarction of unspecified site: Secondary | ICD-10-CM | POA: Diagnosis not present

## 2021-04-05 DIAGNOSIS — Z515 Encounter for palliative care: Secondary | ICD-10-CM | POA: Diagnosis not present

## 2021-04-05 DIAGNOSIS — R52 Pain, unspecified: Secondary | ICD-10-CM

## 2021-04-05 LAB — BODY FLUID CULTURE W GRAM STAIN: Culture: NO GROWTH

## 2021-04-05 MED ORDER — MORPHINE 100MG IN NS 100ML (1MG/ML) PREMIX INFUSION
1.0000 mg/h | INTRAVENOUS | Status: DC
  Administered 2021-04-05: 1 mg/h via INTRAVENOUS
  Filled 2021-04-05: qty 100

## 2021-04-05 MED ORDER — MORPHINE 100MG IN NS 100ML (1MG/ML) PREMIX INFUSION
1.0000 mg/h | INTRAVENOUS | Status: DC
  Administered 2021-04-05: 5 mg/h via INTRAVENOUS
  Administered 2021-04-05: 2 mg/h via INTRAVENOUS
  Administered 2021-04-06 – 2021-04-07 (×3): 5 mg/h via INTRAVENOUS
  Filled 2021-04-05 (×3): qty 100

## 2021-04-05 MED ORDER — MORPHINE BOLUS VIA INFUSION
1.0000 mg | INTRAVENOUS | Status: DC | PRN
  Administered 2021-04-07 – 2021-04-08 (×2): 1 mg via INTRAVENOUS
  Filled 2021-04-05: qty 1

## 2021-04-05 NOTE — Progress Notes (Signed)
PCCM Progress Note   No further critical care needs, PCCM will sign off. Thank you for the opportunity to participate in this patient's care. Please contact if we can be of further assistance.  Kairi Harshbarger D. Kenton Kingfisher, NP-C [facility] Personal contact information can be found on Amion  04/05/2021, 7:17 AM

## 2021-04-05 NOTE — Progress Notes (Signed)
Daily Progress Note   Patient Name: Juan Wheeler       Date: 04/05/2021 DOB: 25-Nov-1960  Age: 60 y.o. MRN#: [id number] Attending Physician: Burnell Blanks Primary Care Physician: [name], MD Admit Date: 04/05/2021 Length of Stay: 5 days  Reason for Consultation/Follow-up: Establishing goals of care, Non pain symptom management, Pain control, and Withdrawal of life-sustaining treatment  HPI/Patient Profile:  Juan Wheeler is a 60 yo male with history of HTN, chronic kidney disease, colorectal adenocarcinoma, lung cancer (squamous cell carcinoma), tobacco abuse, DVT, bilateral PE on Pradaxa, paroxysmal atrial fibrillation, CAD with prior stenting of the Circumflex artery in 2014, ischemic cardiomyopathy with LVEF=30-35% in April 2022, PAD with prior left fem-pop bypass in 2016 and stroke May 2022 who presented to the [facility] ED with c/o chest pain around 4:45 pm. His chest pain began around 7 am but he did not seek medical attention. He has been having pain all day. No relief with NTG. EKG in ED with inferior and anterolateral ST elevation. Pt with ongoing chest pain. Code STEMI activated by ED staff. Systolic BP 43-15 in the ED. He was given a fluid bolus, heparin bolus and started on Levophed.   Palliative care had seen Juan Wheeler in April of this year. At that time he had elected for all measures to sustain life. A review of his PMH and comorbid conditions was had.    We were asked in the setting of recurrent re-hospitalizations and overall poor health state to readdress goals of care. After discussion Thursday, the decision was made to begin weaning IABP and support medications. Extensive discussion was had. Time for family to visit. IABP was weaned and d/c'd yesterday, all infusions weaned and d/c'd yesterday. Patient was tolerating well and moved to 6N.  Current Medications: Scheduled Meds:   aspirin EC  81 mg Oral Daily   Chlorhexidine Gluconate Cloth  6 each Topical Daily    clopidogrel  75 mg Oral Q breakfast   feeding supplement  237 mL Oral BID BM   gabapentin  300 mg Oral QHS   glycopyrrolate  0.4 mg Intravenous Q4H   mouth rinse  15 mL Mouth Rinse BID   pantoprazole (PROTONIX) IV  40 mg Intravenous Q24H   sodium chloride flush  10-40 mL Intracatheter Q12H   sodium chloride flush  3 mL Intravenous Q12H   torsemide  20 mg Oral Daily   Zinc Oxide   Topical Daily    Continuous Infusions:  sodium chloride 250 mL (04/02/21 1818)   norepinephrine (LEVOPHED) Adult infusion 18 mcg/min (04/04/21 1300)    PRN Meds: sodium chloride, sodium chloride, HYDROcodone-acetaminophen, lip balm, LORazepam, morphine injection, ondansetron (ZOFRAN) IV, sodium chloride flush, sodium chloride flush  Subjective:   Subjective: Chart Reviewed. Updates received. Patient Assessed. Created space and opportunity for patient  and family to explore thoughts and feelings regarding current medical situation.  Today's Discussion: When I met with the patient at the bedside he was accompanied by his wife Theodora Blow who is been with him almost constantly.  Overall he is doing okay.  He denies any dyspnea today.  He is having some increasing pain, specifically in his lower back near the site of her wound.  Oral pain medication via hydrocodone was given but not effective.  After discussion with the patient and his wife we decided to go ahead and start a morphine drip with a range of 1 mg/h to 10 mg/h which can be titrated by the nursing staff, as well  as boluses available.  He does appear fatigued today.  His wife is happy to have time to spend with him.  We discussed that they could reach out to the nursing staff for any questions or problems and that we would be happy to respond.  He is asking for ginger ale to drink.  Review of Systems  Constitutional:  Positive for activity change and fatigue.  Respiratory:  Negative for cough, chest tightness and shortness of breath.   Cardiovascular:   Negative for chest pain.  Gastrointestinal:  Negative for abdominal pain, nausea and vomiting.   Objective:   Vital Signs: BP 111/74 (BP Location: Left Arm)   Pulse 100   Temp 97.8 F (36.6 C) (Oral)   Resp 17   Ht _0  (1.753 m)   Wt 68.9 kg   SpO2 90%   BMI 22.43 kg/m  SpO2: SpO2: 90 % O2 Device: O2 Device: Room Air O2 Flow Rate: O2 Flow Rate (L/min): 2 L/min  Physical Exam: Physical Exam Vitals and nursing note reviewed.  Constitutional:      General: He is sleeping. He is not in acute distress.    Appearance: He is ill-appearing. He is not toxic-appearing.  HENT:     Head: Normocephalic and atraumatic.  Cardiovascular:     Rate and Rhythm: Rhythm irregular.  Pulmonary:     Effort: Pulmonary effort is normal. No respiratory distress.  Abdominal:     General: Abdomen is flat.     Palpations: Abdomen is soft.     Tenderness: There is no abdominal tenderness.  Skin:    General: Skin is warm and dry.  Neurological:     General: No focal deficit present.  Psychiatric:        Mood and Affect: Mood normal.        Behavior: Behavior normal.    SpO2: SpO2: 90 % O2 Device:SpO2: 90 % O2 Flow Rate: .O2 Flow Rate (L/min): 2 L/min  IO: Intake/output summary:  Intake/Output Summary (Last 24 hours) at 04/05/2021 4431 Last data filed at 04/05/2021 0200 Gross per 24 hour  Intake 662.13 ml  Output 1550 ml  Net -887.87 ml    LBM: Last BM Date: 04/03/21 Baseline Weight: Weight: 63.5 kg Most recent weight: Weight: 68.9 kg   Palliative Assessment/Data: 30%   Assessment & Plan:   Impression:   Very ill 60 year old male with multiple chronic illnesses now with cardiogenic shock status post MI previously on 2H/ICU IABP and pressor support.  Because of no improvement the decision was made to slowly titrate down and withdraw support, focus on comfort rather than cure, allowing natural death.  This was accomplished yesterday and the patient was transferred to [facility] for comfort  focus.   The patient and the family appear to be handling this better today.  Prognosis is likely hours to days without ongoing blood pressure and cardiac support devices/medications. Pain is becoming more of an issue so Morphine drip was started with dosing and bolus parameters.  SUMMARY OF RECOMMENDATIONS   DNR Focus on comfort Visitation limited to patient's choice No escalation of care Morphine gtt for comfort: 1-10 mg/hr with prn boluses Benzodiazepine prn anxiety Continued PMT support  Code Status: DNR  Goals of Care/Recommendations: Comfort focus Time with family  Symptom Management: Morphine gtt for pain Benzodiazepine for anxiety Robinul for excessive secretions  Prognosis: Hours - Days  Discharge Planning: Anticipated Hospital Death  Discussed with: Ival Bible (RN), patient, Curly [place]ores (wife)  Thank you for allowing [place]  to participate in the care of JACQUELINE DELAPENA PMT will continue to support holistically.  Time Total: 45 min  Visit consisted of counseling and education dealing with the complex and emotionally intense issues of symptom management and palliative care in the setting of serious and potentially life-threatening illness. Greater than 50%  of this time was spent counseling and coordinating care related to the above assessment and plan.  Walden Field, NP Palliative Medicine Team  Team Phone # [phone] (Nights/Weekends)  04/05/2021, 9:28 AM

## 2021-04-05 NOTE — Progress Notes (Signed)
Per Carlis Stable, NP patient can be q shift vitals and non-monitored. Currently on morphine drip. Will continue to monitor.

## 2021-04-05 NOTE — Progress Notes (Signed)
Patient ID: Juan Wheeler, male   DOB: 1961-06-02, 60 y.o.   MRN: [id number]     Advanced Heart Failure Rounding Note  PCP-Cardiologist: None   Subjective:    IABP removed and pressors stopped yesterday.  Patient is now getting comfort care, palliative service following closely.  No labs.    Objective:   Weight Range: 68.9 kg Body mass index is 22.43 kg/m.   Vital Signs:   Temp:  [97.4 F (36.3 C)-99.5 F (37.5 C)] 97.4 F (36.3 C) (07/09 0404) Pulse Rate:  [69-115] 87 (07/09 0404) Resp:  [11-20] 14 (07/09 0404) BP: (92-136)/(58-109) 92/68 (07/09 0404) SpO2:  [78 %-100 %] 98 % (07/09 0404) Arterial Line BP: (89-109)/(50-60) 95/54 (07/08 1300) Last BM Date: 04/03/21  Weight change: Filed Weights   04/02/21 0500 04/03/21 0500 04/04/21 0500  Weight: 67.4 kg 70.7 kg 68.9 kg    Intake/Output:   Intake/Output Summary (Last 24 hours) at 04/05/2021 0719 Last data filed at 04/05/2021 0200 Gross per 24 hour  Intake 757.3 ml  Output 1625 ml  Net -867.7 ml      Physical Exam    General:  NAD HEENT: Normal Neck: Supple. JVP 10. Carotids 2+ bilat; no bruits. No lymphadenopathy or thyromegaly appreciated. Cor: PMI nondisplaced. Irregular rate & rhythm. No rubs, gallops or murmurs. Lungs: Clear Abdomen: Soft, nontender, nondistended. No hepatosplenomegaly. No bruits or masses. Good bowel sounds. Extremities: No cyanosis, clubbing, rash, edema Neuro: Sleeping   Telemetry   Off telemetry  Labs    CBC Recent Labs    04/03/21 0331 04/04/21 0351  WBC 9.1 11.2*  NEUTROABS 7.1  --   HGB 8.7* 10.4*  HCT 26.4* 30.3*  MCV 98.1 95.0  PLT 148* 196   Basic Metabolic Panel Recent Labs    04/03/21 0331 04/04/21 0351  NA 131* 134*  K 3.9 3.4*  CL 102 96*  CO2 25 31  GLUCOSE 95 109*  BUN 14 9  CREATININE 0.97 0.99  CALCIUM 7.9* 8.1*   Liver Function Tests Recent Labs    04/03/21 0331  AST 70*  ALT 51*  ALKPHOS 260*  BILITOT 0.9  PROT 5.2*  ALBUMIN 1.9*    No results for input(s): LIPASE, AMYLASE in the last 72 hours. Cardiac Enzymes No results for input(s): CKTOTAL, CKMB, CKMBINDEX, TROPONINI in the last 72 hours.  BNP: BNP (last 3 results) Recent Labs    01/16/21 0239 02/15/21 1023  BNP 364.0* 535.0*    ProBNP (last 3 results) No results for input(s): PROBNP in the last 8760 hours.   D-Dimer No results for input(s): DDIMER in the last 72 hours. Hemoglobin A1C No results for input(s): HGBA1C in the last 72 hours. Fasting Lipid Panel No results for input(s): CHOL, HDL, LDLCALC, TRIG, CHOLHDL, LDLDIRECT in the last 72 hours. Thyroid Function Tests No results for input(s): TSH, T4TOTAL, T3FREE, THYROIDAB in the last 72 hours.  Invalid input(s): FREET3  Other results:   Imaging    No results found.   Medications:     Scheduled Medications:  aspirin EC  81 mg Oral Daily   Chlorhexidine Gluconate Cloth  6 each Topical Daily   clopidogrel  75 mg Oral Q breakfast   feeding supplement  237 mL Oral BID BM   gabapentin  300 mg Oral QHS   glycopyrrolate  0.4 mg Intravenous Q4H   mouth rinse  15 mL Mouth Rinse BID   pantoprazole (PROTONIX) IV  40 mg Intravenous Q24H   sodium chloride  flush  10-40 mL Intracatheter Q12H   sodium chloride flush  3 mL Intravenous Q12H   torsemide  20 mg Oral Daily   Zinc Oxide   Topical Daily    Infusions:  sodium chloride 250 mL (04/02/21 1818)   norepinephrine (LEVOPHED) Adult infusion 18 mcg/min (04/04/21 1300)    PRN Medications: sodium chloride, sodium chloride, HYDROcodone-acetaminophen, lip balm, LORazepam, morphine injection, ondansetron (ZOFRAN) IV, sodium chloride flush, sodium chloride flush  Assessment/Plan   Patient is now DNR/DNI, getting comfort care.  Will continue ASA/Plavix with recent stent.  Appreciate palliative care.    Length of Stay: Waves, MD  04/05/2021, 7:19 AM  Advanced Heart Failure Team Pager [phone] (M-F; 7a - 5p)  Please contact  [facility] for night-coverage after hours (5p -7a ) and weekends on [email or web address]

## 2021-04-06 DIAGNOSIS — I213 ST elevation (STEMI) myocardial infarction of unspecified site: Secondary | ICD-10-CM | POA: Diagnosis not present

## 2021-04-06 DIAGNOSIS — Z7189 Other specified counseling: Secondary | ICD-10-CM | POA: Diagnosis not present

## 2021-04-06 DIAGNOSIS — Z515 Encounter for palliative care: Secondary | ICD-10-CM | POA: Diagnosis not present

## 2021-04-06 DIAGNOSIS — I2119 ST elevation (STEMI) myocardial infarction involving other coronary artery of inferior wall: Secondary | ICD-10-CM | POA: Diagnosis not present

## 2021-04-06 NOTE — Progress Notes (Signed)
  Spoke with Hospice and Palliative Care team.   Patient actively dying. On morphine gtt for comfort.   Unresponsive with agonal breathing. Family at bedside.   Glori Bickers, MD  3:44 PM

## 2021-04-06 NOTE — Progress Notes (Signed)
Patient is actively passing, IV morphine still infusing. Apneic breathing noted, 2L [place] for comfort. Family at bedside. Emotional support offered.

## 2021-04-06 NOTE — Progress Notes (Signed)
Daily Progress Note   Patient Name: Juan Wheeler       Date: 04/06/2021 DOB: 10-Nov-1960  Age: 60 y.o. MRN#: [id number] Attending Physician: Burnell Blanks Primary Care Physician: [name], MD Admit Date: 04/23/2021 Length of Stay: 6 days  Reason for Consultation/Follow-up: Terminal Care  HPI/Patient Profile:  Juan Wheeler is a 60 yo male with history of HTN, chronic kidney disease, colorectal adenocarcinoma, lung cancer (squamous cell carcinoma), tobacco abuse, DVT, bilateral PE on Pradaxa, paroxysmal atrial fibrillation, CAD with prior stenting of Juan Circumflex artery in 2014, ischemic cardiomyopathy with LVEF=30-35% in April 2022, PAD with prior left fem-pop bypass in 2016 and stroke May 2022 who presented to [facility] ED with c/o chest pain around 4:45 pm. His chest pain began around 7 am but he did not seek medical attention. He has been having pain all day. No relief with NTG. EKG in ED with inferior and anterolateral ST elevation. Pt with ongoing chest pain. Code STEMI activated by ED staff. Systolic BP 01-75 in Juan ED. He was given a fluid bolus, heparin bolus and started on Levophed.   Palliative care had seen Juan Wheeler in April of this year. At that time he had elected for all measures to sustain life. A review of his PMH and comorbid conditions was had.    We were asked in Juan setting of recurrent re-hospitalizations and overall poor health state to readdress goals of care. After discussion Thursday, Juan decision was made to begin weaning IABP and support medications Friday and IABP/all infusions weaned and d/c'd yesterday. Patient was tolerating well and moved to 6N on comfort care.   He was doing well yesterday, able to spend time with his Wheeler. Today he appears to be actively dying.  Current Medications: Scheduled Meds:   aspirin EC  81 mg Oral Daily   Chlorhexidine Gluconate Cloth  6 each Topical Daily   clopidogrel  75 mg Oral Q breakfast   feeding supplement   237 mL Oral BID BM   gabapentin  300 mg Oral QHS   glycopyrrolate  0.4 mg Intravenous Q4H   mouth rinse  15 mL Mouth Rinse BID   pantoprazole (PROTONIX) IV  40 mg Intravenous Q24H   sodium chloride flush  10-40 mL Intracatheter Q12H   sodium chloride flush  3 mL Intravenous Q12H   torsemide  20 mg Oral Daily   Zinc Oxide   Topical Daily    Continuous Infusions:  sodium chloride 250 mL (04/02/21 1818)   morphine 5 mg/hr (04/06/21 0330)   norepinephrine (LEVOPHED) Adult infusion 18 mcg/min (04/04/21 1300)    PRN Meds: sodium chloride, sodium chloride, HYDROcodone-acetaminophen, lip balm, LORazepam, morphine, ondansetron (ZOFRAN) IV, sodium chloride flush, sodium chloride flush  Subjective:   Subjective: Chart Reviewed. Updates received. Patient Assessed. Created space and opportunity for patient  and family to explore thoughts and feelings regarding current medical situation.  Today's Discussion: Met with Juan Wheeler at bedside, who is tearful as she knows he's significantly changed today. She is thankful for Juan time they had yesterday with each other. She realizes he's not responsive this morning and breathing is irregular. However, she states he has been comfortable Juan whole time. We had more discussion on Juan spirit with it's potential being released from Juan flesh to take it's natural place in heaven, which seems to resonate well with her and provide comfort. She is appreciative with Juan care her husband has had. She states she's at peace with  everything, although sad. She indicates she's going to have some select family come visit today to say goodbye.  Review of Systems  Unable to perform ROS: Patient unresponsive   Objective:   Vital Signs: BP (!) 84/65 (BP Location: Left Arm)   Pulse 65   Temp (!) 97.5 F (36.4 C) (Axillary)   Resp 12   Ht 5' 9"  (1.753 m)   Wt 68.9 kg   SpO2 96%   BMI 22.43 kg/m  SpO2: SpO2: 96 % O2 Device: O2 Device: Nasal Cannula O2 Flow  Rate: O2 Flow Rate (L/min): 2 L/min  Physical Exam: Physical Exam Nursing note reviewed.  Constitutional:      Comments: Unresponsive, sppears to be actively dying  Pulmonary:     Comments: Irregular respirations with some periods of brief apnea Skin:    General: Skin is warm and dry.  Neurological:     Mental Status: He is unresponsive.    SpO2: SpO2: 96 % O2 Device:SpO2: 96 % O2 Flow Rate: .O2 Flow Rate (L/min): 2 L/min  IO: Intake/output summary:  Intake/Output Summary (Last 24 hours) at 04/06/2021 1048 Last data filed at 04/06/2021 0900 Gross per 24 hour  Intake 237 ml  Output 2325 ml  Net -2088 ml    LBM: Last BM Date: 04/05/21 (colostomy) Baseline Weight: Weight: 63.5 kg Most recent weight: Weight: 68.9 kg   Palliative Assessment/Data: 10%   Assessment & Plan:   Impression:  Very ill 60 year old male with multiple chronic illnesses presented with cardiogenic shock status post MI previously on 2H/ICU IABP and pressor support.  Because of no improvement Juan decision was made to slowly titrate down and withdraw support, focus on comfort rather than cure, allowing natural death.  This was accomplished yesterday and Juan patient was transferred to [facility] for comfort focus. Juan patient had a good day yesterday, interacting with his loving and devoted Wheeler.  Today he is in Juan state of actively dying. Comfort being provided to Wheeler, Juan Wheeler.  SUMMARY OF RECOMMENDATIONS   Focus on comfort Continue Morphine gtt for comfort Continue Robinul for secretions Continued PMT support  Code Status: DNR  Goals of Care/Recommendations: Comfort  Symptom Management: Morphine gtt for pain/dyspnea Robinul for secretions  Prognosis: Hours - Days  Discharge Planning: Anticipated Hospital Death  Discussed with: Patient's Wheeler  Thank you for allowing [place] to participate in Juan care of Juan Wheeler PMT will continue to support holistically.  Time Total: 35 min  Visit consisted  of counseling and education dealing with Juan complex and emotionally intense issues of symptom management and palliative care in Juan setting of serious and potentially life-threatening illness. Greater than 50%  of this time was spent counseling and coordinating care related to Juan above assessment and plan.  Walden Field, NP Palliative Medicine Team  Team Phone # [phone] (Nights/Weekends)  04/06/2021, 10:48 AM

## 2021-04-07 DIAGNOSIS — I213 ST elevation (STEMI) myocardial infarction of unspecified site: Secondary | ICD-10-CM | POA: Diagnosis not present

## 2021-04-07 DIAGNOSIS — J9601 Acute respiratory failure with hypoxia: Secondary | ICD-10-CM | POA: Diagnosis not present

## 2021-04-07 DIAGNOSIS — Z515 Encounter for palliative care: Secondary | ICD-10-CM | POA: Diagnosis not present

## 2021-04-07 NOTE — Progress Notes (Addendum)
Patient ID: Juan Wheeler, male   DOB: Jun 11, 1961, 60 y.o.   MRN: [id number]     Advanced Heart Failure Rounding Note  PCP-Cardiologist: None   Subjective:   On morphine drip.  Drowsy but will answer questions.  Denies pain. Wife at bedside.   Objective:   Weight Range: 68.9 kg Body mass index is 22.43 kg/m.   Vital Signs:   Temp:  [98.3 F (36.8 C)] 98.3 F (36.8 C) (07/10 2325) Pulse Rate:  [105] 105 (07/10 2325) Resp:  [18] 18 (07/10 2325) BP: (108)/(66) 108/66 (07/10 2325) SpO2:  [91 %] 91 % (07/10 2325) Last BM Date: 04/05/21 (colostomy)  Weight change: Filed Weights   04/02/21 0500 04/03/21 0500 04/04/21 0500  Weight: 67.4 kg 70.7 kg 68.9 kg    Intake/Output:   Intake/Output Summary (Last 24 hours) at 04/07/2021 0903 Last data filed at 04/06/2021 1800 Gross per 24 hour  Intake 30 ml  Output 300 ml  Net -270 ml      Physical Exam    General:  Drowsy HEENT: normal Neck: supple. no JVD. Carotids 2+ bilat; no bruits. No lymphadenopathy or thryomegaly appreciated. Cor: PMI nondisplaced. Regular rate & rhythm. No rubs, gallops or murmurs. Lungs: clear Abdomen: soft, nontender, nondistended. No hepatosplenomegaly. No bruits or masses. Good bowel sounds. Extremities: no cyanosis, clubbing, rash, edema Neuro: alert & orientedx3, cranial nerves grossly intact. moves all 4 extremities w/o difficulty. Affect flat     Labs    CBC No results for input(s): WBC, NEUTROABS, HGB, HCT, MCV, PLT in the last 72 hours.  Basic Metabolic Panel No results for input(s): NA, K, CL, CO2, GLUCOSE, BUN, CREATININE, CALCIUM, MG, PHOS in the last 72 hours.  Liver Function Tests No results for input(s): AST, ALT, ALKPHOS, BILITOT, PROT, ALBUMIN in the last 72 hours.  No results for input(s): LIPASE, AMYLASE in the last 72 hours. Cardiac Enzymes No results for input(s): CKTOTAL, CKMB, CKMBINDEX, TROPONINI in the last 72 hours.  BNP: BNP (last 3 results) Recent Labs     01/16/21 0239 02/15/21 1023  BNP 364.0* 535.0*    ProBNP (last 3 results) No results for input(s): PROBNP in the last 8760 hours.   D-Dimer No results for input(s): DDIMER in the last 72 hours. Hemoglobin A1C No results for input(s): HGBA1C in the last 72 hours. Fasting Lipid Panel No results for input(s): CHOL, HDL, LDLCALC, TRIG, CHOLHDL, LDLDIRECT in the last 72 hours. Thyroid Function Tests No results for input(s): TSH, T4TOTAL, T3FREE, THYROIDAB in the last 72 hours.  Invalid input(s): FREET3  Other results:   Imaging    No results found.   Medications:     Scheduled Medications:  aspirin EC  81 mg Oral Daily   Chlorhexidine Gluconate Cloth  6 each Topical Daily   clopidogrel  75 mg Oral Q breakfast   feeding supplement  237 mL Oral BID BM   gabapentin  300 mg Oral QHS   glycopyrrolate  0.4 mg Intravenous Q4H   mouth rinse  15 mL Mouth Rinse BID   pantoprazole (PROTONIX) IV  40 mg Intravenous Q24H   sodium chloride flush  10-40 mL Intracatheter Q12H   sodium chloride flush  3 mL Intravenous Q12H   torsemide  20 mg Oral Daily   Zinc Oxide   Topical Daily    Infusions:  sodium chloride 250 mL (04/02/21 1818)   morphine 5 mg/hr (04/06/21 2322)   norepinephrine (LEVOPHED) Adult infusion 18 mcg/min (04/04/21 1300)  PRN Medications: sodium chloride, sodium chloride, HYDROcodone-acetaminophen, lip balm, LORazepam, morphine, ondansetron (ZOFRAN) IV, sodium chloride flush, sodium chloride flush  Assessment/Plan   Patient is now DNR/DNI, getting comfort care.  On morphine drip. Appears comfortable.   Will continue ASA/Plavix with recent stent.    Appreciate palliative care.    Length of Stay: Hickman, NP  04/07/2021, 9:03 AM  Advanced Heart Failure Team Pager [phone] (M-F; 7a - 5p)  Please contact [facility] for night-coverage after hours (5p -7a ) and weekends on [email or web address]   Agree with above note.   Comfort care, on morphine gtt.   Will remain in hospital versus [facility], per palliative care service.   Loralie Champagne 04/07/2021

## 2021-04-07 NOTE — Progress Notes (Signed)
Palliative: Mr. Siemon is lying quietly in bed.  He appears acutely ill and frail.  He is now full comfort measures, and appears comfortable.  His wife, Clint Biello, is at bedside.  Mr. Masterson will briefly wake when I touch his arm.  He denies discomfort, and is able to take his few sips of liquids.  Mrs. Hendrix and I talked about his comfort, she denies issues or concerns at this time.  Mr. Siska reaches to rub his nose as we are talking.  He shares that the oxygen is making his nose itch.  Patient and wife are agreeable to remove oxygen.  I encouraged him to ask for medications for breathlessness, anxiety if needed.  Detailed discussion about symptom management with bedside nursing staff.  Conference with attending, bedside nursing staff, chaplain, transition of care team related to patient condition, needs, goals of care.  Plan: Full comfort care, morphine infusion.  Medications adjusted to reflect comfort care. Prognosis: Less than 24 hours would be anticipated.  In hospital death  26 minutes  Quinn Axe, NP Palliative medicine team Team phone [phone] Greater than 50% of this time was spent counseling and coordinating care related to the above assessment and plan.

## 2021-04-07 NOTE — Progress Notes (Signed)
This chaplain is present for F/U spiritual care.   The Pt. shares with the chaplain he is not experiencing any pain. [name] shares the Pt. is struggling to keep the nasal cannula in place. The chaplain observes the Pt. is awake for most of the visit. The chaplain understands the Pt. wife-[name] has found a peaceful place at the Pt. bedside.   The chaplain and Curly [place] find time for story telling.  The chaplain practices reflective listening and understands there is no regret in their marriage of almost 9 years. Curly [place]ores continues to smile as she speaks of her personal faith and reliance on God on the health journey with the Pt.  The chaplain understands the Pt. has several clergy and community members in his family who are praying.  This chaplain is available for F/U spiritual care as needed, [phone].

## 2021-04-08 DIAGNOSIS — R0600 Dyspnea, unspecified: Secondary | ICD-10-CM | POA: Diagnosis not present

## 2021-04-08 DIAGNOSIS — I213 ST elevation (STEMI) myocardial infarction of unspecified site: Secondary | ICD-10-CM | POA: Diagnosis not present

## 2021-04-08 DIAGNOSIS — Z7189 Other specified counseling: Secondary | ICD-10-CM | POA: Diagnosis not present

## 2021-04-08 DIAGNOSIS — I2119 ST elevation (STEMI) myocardial infarction involving other coronary artery of inferior wall: Secondary | ICD-10-CM | POA: Diagnosis not present

## 2021-04-08 DIAGNOSIS — Z515 Encounter for palliative care: Secondary | ICD-10-CM | POA: Diagnosis not present

## 2021-04-08 DIAGNOSIS — I5043 Acute on chronic combined systolic (congestive) and diastolic (congestive) heart failure: Secondary | ICD-10-CM

## 2021-04-08 DIAGNOSIS — N184 Chronic kidney disease, stage 4 (severe): Secondary | ICD-10-CM | POA: Diagnosis not present

## 2021-04-28 NOTE — Progress Notes (Addendum)
Patient ID: Juan Wheeler, male   DOB: 30-Dec-1960, 60 y.o.   MRN: [id number]     Advanced Heart Failure Rounding Note  PCP-Cardiologist: None   Subjective:   Remains on morphine drip and receiving robinul every 4 hours.   Wife at the bedside. Says he has been fewer breaths. No oral intake over night.   Objective:   Weight Range: 68.9 kg Body mass index is 22.43 kg/m.   Vital Signs:   Temp:  [98 F (36.7 C)-98.1 F (36.7 C)] 98.1 F (36.7 C) (07/12 0533) Pulse Rate:  [77-92] 92 (07/12 0533) Resp:  [14-15] 15 (07/12 0533) BP: (84-104)/(59-63) 104/63 (07/12 0533) SpO2:  [58 %-93 %] 72 % (07/12 0533) Last BM Date: 04/05/21 (colostomy)  Weight change: Filed Weights   04/02/21 0500 04/03/21 0500 04/04/21 0500  Weight: 67.4 kg 70.7 kg 68.9 kg    Intake/Output:   Intake/Output Summary (Last 24 hours) at 05/08/2021 0803 Last data filed at 2021/05/08 0550 Gross per 24 hour  Intake 320.43 ml  Output 950 ml  Net -629.57 ml      Physical Exam    General:  Agonal breathing HEENT: normal Neck: supple. JVP to jaw . Carotids 2+ bilat; no bruits. No lymphadenopathy or thryomegaly appreciated. Cor: PMI nondisplaced. Regular rate & rhythm. No rubs, gallops or murmurs. Lungs: Crackles  Abdomen: soft, nontender, nondistended. No hepatosplenomegaly. No bruits or masses. Good bowel sounds. Extremities: no cyanosis, clubbing, rash, edema Neuro: sedated    Labs    CBC No results for input(s): WBC, NEUTROABS, HGB, HCT, MCV, PLT in the last 72 hours.  Basic Metabolic Panel No results for input(s): NA, K, CL, CO2, GLUCOSE, BUN, CREATININE, CALCIUM, MG, PHOS in the last 72 hours.  Liver Function Tests No results for input(s): AST, ALT, ALKPHOS, BILITOT, PROT, ALBUMIN in the last 72 hours.  No results for input(s): LIPASE, AMYLASE in the last 72 hours. Cardiac Enzymes No results for input(s): CKTOTAL, CKMB, CKMBINDEX, TROPONINI in the last 72 hours.  BNP: BNP (last 3  results) Recent Labs    01/16/21 0239 02/15/21 1023  BNP 364.0* 535.0*    ProBNP (last 3 results) No results for input(s): PROBNP in the last 8760 hours.   D-Dimer No results for input(s): DDIMER in the last 72 hours. Hemoglobin A1C No results for input(s): HGBA1C in the last 72 hours. Fasting Lipid Panel No results for input(s): CHOL, HDL, LDLCALC, TRIG, CHOLHDL, LDLDIRECT in the last 72 hours. Thyroid Function Tests No results for input(s): TSH, T4TOTAL, T3FREE, THYROIDAB in the last 72 hours.  Invalid input(s): FREET3  Other results:   Imaging    No results found.   Medications:     Scheduled Medications:  glycopyrrolate  0.4 mg Intravenous Q4H   mouth rinse  15 mL Mouth Rinse BID   torsemide  20 mg Oral Daily   Zinc Oxide   Topical Daily    Infusions:  sodium chloride 250 mL (04/02/21 1818)   morphine 5 mg/hr (04/07/21 2122)    PRN Medications: sodium chloride, lip balm, LORazepam, morphine, ondansetron (ZOFRAN) IV, sodium chloride flush, sodium chloride flush  Assessment/Plan   Patient is now DNR/DNI, getting comfort care.    On morphine drip + robinul.     Stop all oral medications.   Appreciate palliative care.  Anticipate hospital death 24-48 hours.   Length of Stay: [place], NP  2021/05/08, 8:03 AM  Advanced Heart Failure Team Pager [phone] (M-F; 7a - 5p)  Please contact [facility] for night-coverage after hours (5p -7a ) and weekends on [email or web address]   Agree with the above note.  Appreciate palliative care.  Suspect hospital death in next day or 2.  Continue comfort care.   Loralie Champagne 30-Apr-2021

## 2021-04-28 NOTE — Progress Notes (Signed)
Nutrition Brief Note  Chart reviewed. Pt now transitioning to comfort care.  No further nutrition interventions planned at this time.  Please re-consult as needed.   Mariana Single MS, RD, LDN, CNSC Clinical Nutrition Pager listed in [place]

## 2021-04-28 NOTE — Progress Notes (Signed)
Patient was noted unresponsive, no breathing, no pulse, on full comfort care, DNR, wife was at bedside at time of death,, MD made aware and [place] was also notified.

## 2021-04-28 NOTE — Death Summary Note (Signed)
  Advanced Heart Failure Death Summary  Death Summary   Patient ID: Juan Wheeler MRN: [id number], DOB/AGE: 1961-02-27 60 y.o. Admit date: 04/12/2021 D/C date:     04/11/21   Primary Discharge Diagnoses:  Acute Inferior STEMI/ CAD s/p DES to RCA Refractory Cardiogenic Shock  Atrial Fibrillation w/ RVR Metastatic Liver and Rectal Cancer  H/o CVA H/o DVT/PE  Rt Pleural Effusion s/p Thoracentesis  Tobacco Abuse   Hospital Course: 60 y/o chronically ill male w/ both liver and rectal cancer w/ liver metastasis, recent CVA, recent DVT/PE, Afib w/ RVR and wheelchair bound, admitted for late presentation inferior STEMI c/b cardiogenic shock. He had DES to RCA.  Echo w/ EF 30-35% and mild RV dysfunction. Required IABP placement + high dose pressor/inotropic support w/ milrinone, NE and VP. Continued w/ refractory shock but unfortunately not a candidate for advanced therapies given other comorbities including metastatic cancer. Palliative care was consulted and he was made DNR/DNI and transitioned to full comfort care. IABP removed. IV Lasix continued for comfort care + PRN morphine and ativan. Pt passed 04-11-2021.    Significant Diagnostic Studies  Emergent LHC 04/06/2021  Prox RCA to Mid RCA lesion is 100% stenosed. Previously placed Prox Cx to Mid Cx stent (unknown type) is widely patent. Dist LAD lesion is 70% stenosed. Mid LAD lesion is 40% stenosed. A drug-eluting stent was successfully placed using a SYNERGY XD [id number]. Post intervention, there is a 0% residual stenosis.   1. Acute inferior ST elevation MI secondary to thrombotic occlusion of the RCA 2. Successful PTCA/aspiration thrombectomy/stenting of the proximal to mid RCA 3. Patent mid Circumflex stent with minimal restenosis 4. Mild to moderate mid LAD stenosis. Moderately severe very distal LAD stenosis that is unchanged from last cath. 5. Cardiogenic shock. IABP in place. Levophed drip at high dose.     2D Echo 04/01/21 1. Left  ventricular ejection fraction, by estimation, is 30 to 35%. The left ventricle has moderately decreased function. The left ventricle demonstrates regional wall motion abnormalities (lateral hypokinesis). Left ventricular diastolic parameters are indeterminate. There is the interventricular septum is flattened in systole and diastole, consistent with right ventricular pressure and volume overload. 2. Right ventricular systolic function is mildly reduced. The right ventricular size is mildly enlarged. Tricuspid regurgitation signal is inadequate for assessing PA pressure. 3. The mitral valve is normal in structure. No evidence of mitral valve regurgitation. No evidence of mitral stenosis. 4. The aortic valve was not well visualized. Aortic valve regurgitation is not visualized. No aortic stenosis is present. 5. The inferior vena cava is normal in size with greater than 50% respiratory variability, suggesting right atrial pressure of 3 mmHg  Consultations  Cardiology  Advanced Heart Failure Pulmonary Critical Care Medicine  Palliative Care   Duration of Discharge Encounter: Greater than 35 minutes   Signed, Tradarius Reinwald, [name] 04-11-2021, 2:41 PM

## 2021-04-28 NOTE — Progress Notes (Signed)
Daily Progress Note   Patient Name: Juan Wheeler       Date: 2021-04-15 DOB: [birth year]  Age: 60 y.o. MRN#: [id number] Attending Physician: Burnell Blanks Primary Care Physician: [name], MD Admit Date: 04/09/2021 Length of Stay: 8 days  Reason for Consultation/Follow-up: Non pain symptom management, Pain control, and Terminal Care  HPI/Patient Profile:  Patient with multiple cormirbid conditions (including recently dx lung cancer) s/p admission for STEMI. Was in the ICU on multiple pressors and IABP which has since been weaned to off in favor of comfort care. He is currently on 6N on comfort care with Robinul, Morphine gtt with prn boluses, and prn Ativan. Wife has been at his bedside constantly. Anticipated hospital death.  Current Medications: Scheduled Meds:   glycopyrrolate  0.4 mg Intravenous Q4H   mouth rinse  15 mL Mouth Rinse BID   Zinc Oxide   Topical Daily    Continuous Infusions:  sodium chloride 250 mL (04/02/21 1818)   morphine 5 mg/hr (04/07/21 2122)    PRN Meds: sodium chloride, lip balm, LORazepam, morphine, ondansetron (ZOFRAN) IV, sodium chloride flush, sodium chloride flush  Subjective:   Subjective: Chart Reviewed. Updates received. Patient Assessed. Created space and opportunity for patient  and family to explore thoughts and feelings regarding current medical situation.  Today's Discussion: The patient's wife is at bedside holding the patient's hand.  He is in the bed with irregular respirations, appears to be actively dying.  We had a good long discussion about their life together in which she shared many wonderful details.  I think it was helpful for her to be able to reminisce on their life together.  She states she is at peace with the situation.  She has good support through family and friends as well as clergy.  We discussed that I would try to stop in this afternoon to see her again.  Review of Systems  Unable to perform ROS: Acuity  of condition   Objective:   Vital Signs: BP 100/68 (BP Location: Left Arm)   Pulse (!) 118   Temp 98.1 F (36.7 C) (Oral)   Resp 16   Ht 5\' 9"  (1.753 m)   Wt 68.9 kg   SpO2 (!) 47%   BMI 22.43 kg/m  SpO2: SpO2: (!) 47 % O2 Device: O2 Device: Room Air O2 Flow Rate: O2 Flow Rate (L/min): 3 L/min  Physical Exam: Physical Exam Constitutional:      General: He is sleeping.     Appearance: He is ill-appearing and toxic-appearing.     Comments: unresponsive  Cardiovascular:     Rate and Rhythm: Tachycardia present.  Pulmonary:     Comments: Irregular respiratory pattern Skin:    General: Skin is cool.    SpO2: SpO2: (!) 47 % O2 Device:SpO2: (!) 47 % O2 Flow Rate: .O2 Flow Rate (L/min): 3 L/min  IO: Intake/output summary:  Intake/Output Summary (Last 24 hours) at April 15, 2021 4496 Last data filed at Apr 15, 2021 0550 Gross per 24 hour  Intake 320.43 ml  Output 950 ml  Net -629.57 ml    LBM: Last BM Date: 04/05/21 (colostomy) Baseline Weight: Weight: 63.5 kg Most recent weight: Weight: 68.9 kg   Palliative Assessment/Data: 20%   Assessment & Plan:   Impression:  Very ill 60 year old male with multiple chronic illnesses presented with cardiogenic shock status post MI previously on 2H/ICU IABP and pressor support.  Because of no improvement the decision was made to slowly titrate down and withdraw support,  focus on comfort rather than cure, allowing natural death.  This was accomplished yesterday and the patient was transferred to [facility] for comfort focus. The patient had a good day yesterday, interacting with his loving and devoted wife.   Today he is in the state of actively dying. Comfort being provided to wife, [place].  SUMMARY OF RECOMMENDATIONS   Focus on comfort Continue Morphine gtt for comfort Continue Robinul for secretions Continued PMT support  Code Status: DNR  Goals of Care/Recommendations: Comfort  Symptom Management: Morphine gtt for  pain/dyspnea Robinul for secretions  Prognosis: Hours - Days  Discharge Planning: Anticipated Hospital Death  Thank you for allowing [place] to participate in the care of Juan Wheeler PMT will continue to support holistically.  Time Total: 75 min  Visit consisted of counseling and education dealing with the complex and emotionally intense issues of symptom management and palliative care in the setting of serious and potentially life-threatening illness. Greater than 50%  of this time was spent counseling and coordinating care related to the above assessment and plan.  Walden Field, NP Palliative Medicine Team  Team Phone # [phone] (Nights/Weekends)  11-Apr-2021, 9:18 AM

## 2021-04-28 DEATH — deceased

## 2021-05-02 ENCOUNTER — Inpatient Hospital Stay: Payer: Medicare PPO | Admitting: Physical Medicine and Rehabilitation
